# Patient Record
Sex: Female | Born: 1955 | Race: White | Hispanic: No | Marital: Married | Smoking: Current every day smoker
Health system: Southern US, Community
[De-identification: ages and names within clinical notes are randomized; demographics above are authoritative.]

## PROBLEM LIST (undated history)

## (undated) DIAGNOSIS — K219 Gastro-esophageal reflux disease without esophagitis: Secondary | ICD-10-CM

## (undated) DIAGNOSIS — J449 Chronic obstructive pulmonary disease, unspecified: Secondary | ICD-10-CM

## (undated) DIAGNOSIS — T4145XA Adverse effect of unspecified anesthetic, initial encounter: Secondary | ICD-10-CM

## (undated) DIAGNOSIS — Z87442 Personal history of urinary calculi: Secondary | ICD-10-CM

## (undated) DIAGNOSIS — M1990 Unspecified osteoarthritis, unspecified site: Secondary | ICD-10-CM

## (undated) DIAGNOSIS — I10 Essential (primary) hypertension: Secondary | ICD-10-CM

## (undated) DIAGNOSIS — C801 Malignant (primary) neoplasm, unspecified: Secondary | ICD-10-CM

## (undated) DIAGNOSIS — J45909 Unspecified asthma, uncomplicated: Secondary | ICD-10-CM

## (undated) DIAGNOSIS — D649 Anemia, unspecified: Secondary | ICD-10-CM

## (undated) DIAGNOSIS — Z9289 Personal history of other medical treatment: Secondary | ICD-10-CM

## (undated) DIAGNOSIS — G8929 Other chronic pain: Secondary | ICD-10-CM

## (undated) DIAGNOSIS — G629 Polyneuropathy, unspecified: Secondary | ICD-10-CM

## (undated) DIAGNOSIS — K859 Acute pancreatitis without necrosis or infection, unspecified: Secondary | ICD-10-CM

## (undated) DIAGNOSIS — E162 Hypoglycemia, unspecified: Secondary | ICD-10-CM

## (undated) DIAGNOSIS — R238 Other skin changes: Secondary | ICD-10-CM

## (undated) DIAGNOSIS — D62 Acute posthemorrhagic anemia: Secondary | ICD-10-CM

## (undated) DIAGNOSIS — R51 Headache: Secondary | ICD-10-CM

## (undated) DIAGNOSIS — F17210 Nicotine dependence, cigarettes, uncomplicated: Secondary | ICD-10-CM

## (undated) DIAGNOSIS — F419 Anxiety disorder, unspecified: Secondary | ICD-10-CM

## (undated) DIAGNOSIS — M5430 Sciatica, unspecified side: Secondary | ICD-10-CM

## (undated) DIAGNOSIS — D509 Iron deficiency anemia, unspecified: Secondary | ICD-10-CM

## (undated) DIAGNOSIS — IMO0001 Reserved for inherently not codable concepts without codable children: Secondary | ICD-10-CM

## (undated) DIAGNOSIS — G5600 Carpal tunnel syndrome, unspecified upper limb: Secondary | ICD-10-CM

## (undated) DIAGNOSIS — R011 Cardiac murmur, unspecified: Secondary | ICD-10-CM

## (undated) DIAGNOSIS — R519 Headache, unspecified: Secondary | ICD-10-CM

## (undated) DIAGNOSIS — Z9889 Other specified postprocedural states: Secondary | ICD-10-CM

## (undated) DIAGNOSIS — J189 Pneumonia, unspecified organism: Secondary | ICD-10-CM

## (undated) DIAGNOSIS — T7840XA Allergy, unspecified, initial encounter: Secondary | ICD-10-CM

## (undated) DIAGNOSIS — R569 Unspecified convulsions: Secondary | ICD-10-CM

## (undated) DIAGNOSIS — G709 Myoneural disorder, unspecified: Secondary | ICD-10-CM

## (undated) DIAGNOSIS — R112 Nausea with vomiting, unspecified: Secondary | ICD-10-CM

## (undated) DIAGNOSIS — T8859XA Other complications of anesthesia, initial encounter: Secondary | ICD-10-CM

## (undated) HISTORY — PX: FOOT SURGERY: SHX648

## (undated) HISTORY — PX: COLONOSCOPY: SHX174

## (undated) HISTORY — PX: LAMINECTOMY: SHX219

## (undated) HISTORY — PX: CERVICAL FUSION: SHX112

## (undated) HISTORY — DX: Myoneural disorder, unspecified: G70.9

## (undated) HISTORY — DX: Chronic obstructive pulmonary disease, unspecified: J44.9

## (undated) HISTORY — PX: BACK SURGERY: SHX140

## (undated) HISTORY — DX: Iron deficiency anemia, unspecified: D50.9

## (undated) HISTORY — PX: OTHER SURGICAL HISTORY: SHX169

## (undated) HISTORY — DX: Anemia, unspecified: D64.9

## (undated) HISTORY — PX: TONSILLECTOMY: SUR1361

## (undated) HISTORY — PX: SPINAL CORD STIMULATOR BATTERY EXCHANGE: SHX6202

## (undated) HISTORY — DX: Anxiety disorder, unspecified: F41.9

## (undated) HISTORY — DX: Unspecified osteoarthritis, unspecified site: M19.90

## (undated) HISTORY — DX: Nicotine dependence, cigarettes, uncomplicated: F17.210

## (undated) HISTORY — DX: Hypoglycemia, unspecified: E16.2

## (undated) HISTORY — DX: Unspecified asthma, uncomplicated: J45.909

## (undated) HISTORY — PX: IMPLANTATION / PLACEMENT EPIDURAL NEUROSTIMULATOR ELECTRODES: SUR687

## (undated) HISTORY — DX: Acute posthemorrhagic anemia: D62

## (undated) HISTORY — DX: Allergy, unspecified, initial encounter: T78.40XA

## (undated) HISTORY — DX: Other skin changes: R23.8

## (undated) HISTORY — PX: LUMBAR FUSION: SHX111

---

## 2003-03-07 DIAGNOSIS — R011 Cardiac murmur, unspecified: Secondary | ICD-10-CM

## 2003-03-07 HISTORY — PX: ABDOMINAL HYSTERECTOMY: SHX81

## 2003-03-07 HISTORY — DX: Cardiac murmur, unspecified: R01.1

## 2015-02-22 ENCOUNTER — Ambulatory Visit (INDEPENDENT_AMBULATORY_CARE_PROVIDER_SITE_OTHER): Admitting: Family Medicine

## 2015-02-22 ENCOUNTER — Encounter: Payer: Self-pay | Admitting: Family Medicine

## 2015-02-22 VITALS — BP 153/89 | HR 80 | Temp 97.6°F | Ht 63.0 in | Wt 101.2 lb

## 2015-02-22 DIAGNOSIS — J42 Unspecified chronic bronchitis: Secondary | ICD-10-CM

## 2015-02-22 DIAGNOSIS — M549 Dorsalgia, unspecified: Secondary | ICD-10-CM

## 2015-02-22 DIAGNOSIS — R519 Headache, unspecified: Secondary | ICD-10-CM | POA: Insufficient documentation

## 2015-02-22 DIAGNOSIS — G44219 Episodic tension-type headache, not intractable: Secondary | ICD-10-CM

## 2015-02-22 DIAGNOSIS — R51 Headache: Secondary | ICD-10-CM

## 2015-02-22 DIAGNOSIS — Z23 Encounter for immunization: Secondary | ICD-10-CM

## 2015-02-22 DIAGNOSIS — G8929 Other chronic pain: Secondary | ICD-10-CM

## 2015-02-22 DIAGNOSIS — J449 Chronic obstructive pulmonary disease, unspecified: Secondary | ICD-10-CM | POA: Insufficient documentation

## 2015-02-22 DIAGNOSIS — I10 Essential (primary) hypertension: Secondary | ICD-10-CM

## 2015-02-22 DIAGNOSIS — F1190 Opioid use, unspecified, uncomplicated: Secondary | ICD-10-CM | POA: Diagnosis not present

## 2015-02-22 MED ORDER — METHOCARBAMOL 500 MG PO TABS: 500.0000 mg | ORAL_TABLET | Freq: Three times a day (TID) | ORAL | Status: DC | PRN

## 2015-02-22 MED ORDER — AMLODIPINE BESYLATE 5 MG PO TABS: 5.0000 mg | ORAL_TABLET | Freq: Every day | ORAL | Status: DC

## 2015-02-22 MED ORDER — PREGABALIN 150 MG PO CAPS: 150.0000 mg | ORAL_CAPSULE | Freq: Three times a day (TID) | ORAL | Status: DC

## 2015-02-22 MED ORDER — BUDESONIDE-FORMOTEROL FUMARATE 160-4.5 MCG/ACT IN AERO
2.0000 | INHALATION_SPRAY | Freq: Two times a day (BID) | RESPIRATORY_TRACT | Status: DC
Start: 2015-02-22 — End: 2015-09-02

## 2015-02-22 MED ORDER — TRAZODONE HCL 100 MG PO TABS: 50.0000 mg | ORAL_TABLET | Freq: Every day | ORAL | Status: DC

## 2015-02-22 MED ORDER — ALBUTEROL SULFATE HFA 108 (90 BASE) MCG/ACT IN AERS: 2.0000 | INHALATION_SPRAY | Freq: Four times a day (QID) | RESPIRATORY_TRACT | Status: DC | PRN

## 2015-02-22 MED ORDER — DULOXETINE HCL 30 MG PO CPEP: 90.0000 mg | ORAL_CAPSULE | Freq: Every day | ORAL | Status: DC

## 2015-02-22 MED ORDER — PREGABALIN 200 MG PO CAPS: 200.0000 mg | ORAL_CAPSULE | Freq: Three times a day (TID) | ORAL | Status: DC

## 2015-02-22 NOTE — Progress Notes (Addendum)
HPI  Patient presents today to establish care.  Patient has recently moved from [facility].  She has chronic back pain Recently taking 80 mg of hydrocodone daily (20 mg QID) , 350 mg of Soma 3 times daily, Lyrica 200 mg 3 times daily ,  And Cymbalta 30 mg 3 times daiy  she had a pain doctor in [place] She has been out of hydrocodone and Soma for 8 days, she denies any withdrawal effects or diarrhea during this time   headache  bandlike distribution sharp pain , onset for several years now, worse over the last few months. Previously helped by trazodone wich helped hersleep , relieving her headaches.  requests trazodone today.   elevated blood pressure No history of hypertension, howeve states that hr systolic blood pressure is frequently  160-170 at home over 03J diastolic No chest pain, palpitations, or leg edema.  COPD Still smoking Doing well wih Symbicort and abuterol She does have baseline cough and shortness o brath   PMH: Smoking status noted Previous medical, surgical, social, family history reviewed and updated in EMR ROS: Per HPI  Objective: BP 153/89 mmHg  Pulse 80  Temp(Src) 97.6 F (36.4 C) (Oral)  Ht '5\' 3"'  (1.6 m)  Wt 101 lb 3.2 oz (45.904 kg)  BMI 17.93 kg/m2 Gen: NAD, alert, cooperative with exam HEENT: NCAT, TMs normal bilaterally, nares clear but with Perforated septum, oropharynx clear CV: RRR, good S1/S2, no murmur Resp: CTABL, no wheezes, non-labored Abd: SNTND, BS present, no guarding or organomegaly Ext: No edema, warm Neuro: Alert and oriented, normal gait  Assessment and plan:  #  Chronic pain Refill Cymbalta and Lyrica, however I decreased Lyrca to 150 mg 3 time daily Declines Norco and Soma  Trial of Robaxin instead of Soma Refer topain management [place] controlled substance database reviewed which has no fills  # HTN New Dx Start amlodipine Labs, fasting  # COPD Smoking cessation  Continue proair and  symbicort  # Sleep, headache Trazodone per her request.  Consistent with tension type HA, Described as stable over several years with tension type distribution     Orders Placed This Encounter  Procedures  . CBC with Differential  . CMP14+EGFR  . Lipid panel  . Ambulatory referral to Pain Clinic    Referral Priority:  Routine    Referral Type:  Consultation    Referral Reason:  Specialty Services Required    Requested Specialty:  Pain Medicine    Number of Visits Requested:  1    Meds ordered this encounter  Medications  . DISCONTD: budesonide-formoterol (SYMBICORT) 160-4.5 MCG/ACT inhaler    Sig: Inhale 2 puffs into the lungs 2 (two) times daily.  Marland Kitchen DISCONTD: ALBUTEROL SULFATE HFA IN    Sig: Inhale into the lungs.  Marland Kitchen DISCONTD: DULoxetine HCl (CYMBALTA PO)    Sig: Take 90 mg by mouth daily.  . carisoprodol (SOMA) 350 MG tablet    Sig: Take 350 mg by mouth 3 (three) times daily. Reported on 02/22/2015  . DISCONTD: pregabalin (LYRICA) 200 MG capsule    Sig: Take 200 mg by mouth 3 (three) times daily.  . Hydrocodone-Acetaminophen 10-300 MG TABS    Sig: Take 1 tablet by mouth every 6 (six) hours as needed.  . Aspirin-Salicylamide-Caffeine (BC HEADACHE POWDER PO)    Sig: Take by mouth.  . ranitidine (ACID REDUCER) 150 MG tablet    Sig: Take 150 mg by mouth daily.  . cetirizine (ZYRTEC) 10 MG tablet  Sig: Take 10 mg by mouth daily.  . DULoxetine (CYMBALTA) 30 MG capsule    Sig: Take 3 capsules (90 mg total) by mouth daily.    Dispense:  90 capsule    Refill:  0  . budesonide-formoterol (SYMBICORT) 160-4.5 MCG/ACT inhaler    Sig: Inhale 2 puffs into the lungs 2 (two) times daily.    Dispense:  1 Inhaler    Refill:  11  . albuterol (PROVENTIL HFA;VENTOLIN HFA) 108 (90 BASE) MCG/ACT inhaler    Sig: Inhale 2 puffs into the lungs every 6 (six) hours as needed for wheezing or shortness of breath.    Dispense:  1 Inhaler    Refill:  0  . DISCONTD: pregabalin (LYRICA) 200 MG  capsule    Sig: Take 1 capsule (200 mg total) by mouth 3 (three) times daily.    Dispense:  90 capsule    Refill:  3  . amLODipine (NORVASC) 5 MG tablet    Sig: Take 1 tablet (5 mg total) by mouth daily.    Dispense:  90 tablet    Refill:  3  . pregabalin (LYRICA) 150 MG capsule    Sig: Take 1 capsule (150 mg total) by mouth 3 (three) times daily.    Dispense:  90 capsule    Refill:  3  . traZODone (DESYREL) 100 MG tablet    Sig: Take 0.5-1 tablets (50-100 mg total) by mouth at bedtime.    Dispense:  30 tablet    Refill:  [place], MD [place] 02/22/2015, 2:02 PM

## 2015-02-22 NOTE — Patient Instructions (Addendum)
Great to meet you!  Please come back in 1 month  I have started 2 new medications  Trazodone for sleep Amlodipine for high blood pressure  We will call with labs within 1 week  We will arrange a pain clinic referral

## 2015-02-22 NOTE — Addendum Note (Signed)
Addended by: Timmothy Euler on: 02/22/2015 05:26 PM   Modules accepted: Miquel Dunn

## 2015-02-23 LAB — CMP14+EGFR
ALK PHOS: 78 IU/L (ref 39–117)
ALT: 9 IU/L (ref 0–32)
AST: 18 IU/L (ref 0–40)
Albumin/Globulin Ratio: 1.9 (ref 1.1–2.5)
Albumin: 4.3 g/dL (ref 3.6–4.8)
BUN/Creatinine Ratio: 21 (ref 11–26)
BUN: 11 mg/dL (ref 8–27)
Bilirubin Total: <0.2 mg/dL (ref 0.0–1.2)
CO2: 21 mmol/L (ref 18–29)
CREATININE: 0.53 mg/dL — AB (ref 0.57–1.00)
Calcium: 8.7 mg/dL (ref 8.7–10.3)
Chloride: 102 mmol/L (ref 96–106)
GFR calc Af Amer: 119 mL/min/{1.73_m2} (ref >59)
GFR calc non Af Amer: 103 mL/min/{1.73_m2} (ref >59)
GLOBULIN, TOTAL: 2.3 g/dL (ref 1.5–4.5)
Glucose: 86 mg/dL (ref 65–99)
POTASSIUM: 4.3 mmol/L (ref 3.5–5.2)
SODIUM: 141 mmol/L (ref 134–144)
Total Protein: 6.6 g/dL (ref 6.0–8.5)

## 2015-02-23 LAB — CBC WITH DIFFERENTIAL/PLATELET
BASOS ABS: 0.1 10*3/uL (ref 0.0–0.2)
Basos: 1 %
EOS (ABSOLUTE): 0.1 10*3/uL (ref 0.0–0.4)
Eos: 2 %
HEMATOCRIT: 33.2 % — AB (ref 34.0–46.6)
Hemoglobin: 10.5 g/dL — ABNORMAL LOW (ref 11.1–15.9)
Immature Grans (Abs): 0 10*3/uL (ref 0.0–0.1)
Immature Granulocytes: 0 %
LYMPHS ABS: 2 10*3/uL (ref 0.7–3.1)
Lymphs: 33 %
MCH: 29.1 pg (ref 26.6–33.0)
MCHC: 31.6 g/dL (ref 31.5–35.7)
MCV: 92 fL (ref 79–97)
MONOS ABS: 0.4 10*3/uL (ref 0.1–0.9)
Monocytes: 7 %
Neutrophils Absolute: 3.5 10*3/uL (ref 1.4–7.0)
Neutrophils: 57 %
Platelets: 256 10*3/uL (ref 150–379)
RBC: 3.61 x10E6/uL — AB (ref 3.77–5.28)
RDW: 16.7 % — AB (ref 12.3–15.4)
WBC: 6.1 10*3/uL (ref 3.4–10.8)

## 2015-02-23 LAB — LIPID PANEL
CHOLESTEROL TOTAL: 258 mg/dL — AB (ref 100–199)
Chol/HDL Ratio: 2.8 ratio units (ref 0.0–4.4)
HDL: 92 mg/dL (ref >39)
LDL CALC: 151 mg/dL — AB (ref 0–99)
TRIGLYCERIDES: 74 mg/dL (ref 0–149)
VLDL Cholesterol Cal: 15 mg/dL (ref 5–40)

## 2015-02-25 ENCOUNTER — Telehealth: Payer: Self-pay

## 2015-02-25 NOTE — Telephone Encounter (Signed)
Insurance prior authorized Symbicort 160-4.5 through 03/05/98  Case # [id number]

## 2015-03-12 ENCOUNTER — Ambulatory Visit (INDEPENDENT_AMBULATORY_CARE_PROVIDER_SITE_OTHER): Admitting: Family Medicine

## 2015-03-12 ENCOUNTER — Encounter: Payer: Self-pay | Admitting: Family Medicine

## 2015-03-12 VITALS — BP 98/59 | HR 88 | Temp 100.7°F | Ht 63.0 in | Wt 100.0 lb

## 2015-03-12 DIAGNOSIS — J029 Acute pharyngitis, unspecified: Secondary | ICD-10-CM

## 2015-03-12 DIAGNOSIS — R6889 Other general symptoms and signs: Secondary | ICD-10-CM | POA: Diagnosis not present

## 2015-03-12 DIAGNOSIS — R509 Fever, unspecified: Secondary | ICD-10-CM

## 2015-03-12 LAB — POCT INFLUENZA A/B
INFLUENZA A, POC: NEGATIVE
INFLUENZA B, POC: NEGATIVE

## 2015-03-12 LAB — POCT RAPID STREP A (OFFICE): RAPID STREP A SCREEN: NEGATIVE

## 2015-03-12 MED ORDER — OSELTAMIVIR PHOSPHATE 75 MG PO CAPS: 75.0000 mg | ORAL_CAPSULE | Freq: Two times a day (BID) | ORAL | Status: DC

## 2015-03-12 NOTE — Progress Notes (Signed)
Subjective:  Patient ID: Jasmine [name], female    DOB: 02/06/1956  Age: 60 y.o. MRN: [id number]  CC: Sore Throat and Fever   HPI Jasmine [name]. [name] presents for  Fever to 101.9 this AM. Onset 2 days ago. Felt fluid in ears and sinuses.Had flu shot.  History Jasmine [name] has a past medical history of Allergy; Anemia; Anxiety; Arthritis; Asthma; COPD (chronic obstructive pulmonary disease) ([place]); Hypoglycemia; and Neuromuscular disorder ([place]).   She has past surgical history that includes Cesarean section (1982); Abdominal hysterectomy (2005); disk repair; and Implantation / placement epidural neurostimulator electrodes.   Her family history includes Asthma in her son; COPD in her father and mother; Cancer in her father; Dementia in her father; Diabetes in her father and mother; Heart disease in her father and mother; Hyperlipidemia in her mother; Hypertension in her mother.She reports that she has been smoking Cigarettes.  She has been smoking about 1.00 pack per day. She does not have any smokeless tobacco history on file. She reports that she does not drink alcohol or use illicit drugs.  Outpatient Prescriptions Prior to Visit  Medication Sig Dispense Refill  . albuterol (PROVENTIL HFA;VENTOLIN HFA) 108 (90 BASE) MCG/ACT inhaler Inhale 2 puffs into the lungs every 6 (six) hours as needed for wheezing or shortness of breath. 1 Inhaler 0  . amLODipine (NORVASC) 5 MG tablet Take 1 tablet (5 mg total) by mouth daily. 90 tablet 3  . Aspirin-Salicylamide-Caffeine (BC HEADACHE POWDER PO) Take by mouth.    . budesonide-formoterol (SYMBICORT) 160-4.5 MCG/ACT inhaler Inhale 2 puffs into the lungs 2 (two) times daily. 1 Inhaler 11  . cetirizine (ZYRTEC) 10 MG tablet Take 10 mg by mouth daily. Reported on 03/12/2015    . DULoxetine (CYMBALTA) 30 MG capsule Take 3 capsules (90 mg total) by mouth daily. 90 capsule 0  . methocarbamol (ROBAXIN) 500 MG tablet Take 1 tablet (500 mg total) by mouth every 8 (eight)  hours as needed for muscle spasms. 90 tablet 3  . pregabalin (LYRICA) 150 MG capsule Take 1 capsule (150 mg total) by mouth 3 (three) times daily. 90 capsule 3  . ranitidine (ACID REDUCER) 150 MG tablet Take 150 mg by mouth daily.    . traZODone (DESYREL) 100 MG tablet Take 0.5-1 tablets (50-100 mg total) by mouth at bedtime. 30 tablet 3  . carisoprodol (SOMA) 350 MG tablet Take 350 mg by mouth 3 (three) times daily. Reported on 03/12/2015    . Hydrocodone-Acetaminophen 10-300 MG TABS Take 1 tablet by mouth every 6 (six) hours as needed. Reported on 03/12/2015     No facility-administered medications prior to visit.    ROS Review of Systems  Constitutional: Negative for fever, chills, activity change and appetite change.  HENT: Positive for congestion, ear pain and postnasal drip. Negative for ear discharge, hearing loss, nosebleeds, rhinorrhea, sinus pressure, sneezing and trouble swallowing.   Respiratory: Positive for cough. Negative for chest tightness and shortness of breath.   Cardiovascular: Negative for chest pain and palpitations.  Skin: Negative for rash.    Objective:  BP 98/59 mmHg  Pulse 88  Temp(Src) 100.7 F (38.2 C) (Oral)  Ht '5\' 3"'$  (1.6 m)  Wt 100 lb (45.36 kg)  BMI 17.72 kg/m2  BP Readings from Last 3 Encounters:  03/12/15 98/59  02/22/15 153/89    Wt Readings from Last 3 Encounters:  03/12/15 100 lb (45.36 kg)  02/22/15 101 lb 3.2 oz (45.904 kg)     Physical Exam  Constitutional: She appears well-developed and well-nourished.  HENT:  Head: Normocephalic and atraumatic.  Right Ear: Tympanic membrane and external ear normal. No decreased hearing is noted.  Left Ear: Tympanic membrane and external ear normal. No decreased hearing is noted.  Nose: Mucosal edema present. Right sinus exhibits no frontal sinus tenderness. Left sinus exhibits no frontal sinus tenderness.  Mouth/Throat: No oropharyngeal exudate or posterior oropharyngeal erythema.  Neck: No  Brudzinski's sign noted.  Pulmonary/Chest: Breath sounds normal. No respiratory distress.  Lymphadenopathy:       Head (right side): No preauricular adenopathy present.       Head (left side): No preauricular adenopathy present.       Right cervical: No superficial cervical adenopathy present.      Left cervical: No superficial cervical adenopathy present.     Lab Results  Component Value Date   WBC 17.7* 03/12/2015   HCT 31.3* 03/12/2015   PLT 343 03/12/2015   GLUCOSE 86 02/22/2015   CHOL 258* 02/22/2015   TRIG 74 02/22/2015   HDL 92 02/22/2015   LDLCALC 151* 02/22/2015   ALT 9 02/22/2015   AST 18 02/22/2015   NA 141 02/22/2015   K 4.3 02/22/2015   CL 102 02/22/2015   CREATININE 0.53* 02/22/2015   BUN 11 02/22/2015   CO2 21 02/22/2015    Results for orders placed or performed in visit on 03/12/15  CBC with Differential/Platelet  Result Value Ref Range   WBC 17.7 (H) 3.4 - 10.8 x10E3/uL   RBC 3.48 (L) 3.77 - 5.28 x10E6/uL   Hemoglobin 10.0 (L) 11.1 - 15.9 g/dL   Hematocrit 31.3 (L) 34.0 - 46.6 %   MCV 90 79 - 97 fL   MCH 28.7 26.6 - 33.0 pg   MCHC 31.9 31.5 - 35.7 g/dL   RDW 15.7 (H) 12.3 - 15.4 %   Platelets 343 150 - 379 x10E3/uL   Neutrophils 78 %   Lymphs 9 %   Monocytes 13 %   Eos 0 %   Basos 0 %   Neutrophils Absolute 13.9 (H) 1.4 - 7.0 x10E3/uL   Lymphocytes Absolute 1.5 0.7 - 3.1 x10E3/uL   Monocytes Absolute 2.2 (H) 0.1 - 0.9 x10E3/uL   EOS (ABSOLUTE) 0.0 0.0 - 0.4 x10E3/uL   Basophils Absolute 0.0 0.0 - 0.2 x10E3/uL   Immature Granulocytes 0 %   Immature Grans (Abs) 0.0 0.0 - 0.1 x10E3/uL  POCT Influenza A/B  Result Value Ref Range   Influenza A, POC Negative Negative   Influenza B, POC Negative Negative  POCT rapid strep A  Result Value Ref Range   Rapid Strep A Screen Negative Negative     Assessment & Plan:   Jasmine [name] was seen today for sore throat and fever.  Diagnoses and all orders for this visit:  Sore throat -     POCT Influenza  A/B -     POCT rapid strep A -     CBC with Differential/Platelet  Fever, unspecified -     POCT Influenza A/B -     POCT rapid strep A -     CBC with Differential/Platelet  Flu-like symptoms -     CBC with Differential/Platelet  Other orders -     oseltamivir (TAMIFLU) 75 MG capsule; Take 1 capsule (75 mg total) by mouth 2 (two) times daily. -     amoxicillin-clavulanate (AUGMENTIN) 875-125 MG tablet; Take 1 tablet by mouth 2 (two) times daily. Take all of this medication  I am having Jasmine [name] start on oseltamivir and amoxicillin-clavulanate. I am also having her maintain her carisoprodol, Hydrocodone-Acetaminophen, Aspirin-Salicylamide-Caffeine (BC HEADACHE POWDER PO), ranitidine, cetirizine, DULoxetine, budesonide-formoterol, albuterol, amLODipine, pregabalin, traZODone, and methocarbamol.  Meds ordered this encounter  Medications  . oseltamivir (TAMIFLU) 75 MG capsule    Sig: Take 1 capsule (75 mg total) by mouth 2 (two) times daily.    Dispense:  10 capsule    Refill:  0  . amoxicillin-clavulanate (AUGMENTIN) 875-125 MG tablet    Sig: Take 1 tablet by mouth 2 (two) times daily. Take all of this medication    Dispense:  20 tablet    Refill:  0    Follow-up: Return if symptoms worsen or fail to improve.  Claretta Fraise, M.D.

## 2015-03-13 LAB — CBC WITH DIFFERENTIAL/PLATELET
Basophils Absolute: 0 10*3/uL (ref 0.0–0.2)
Basos: 0 %
EOS (ABSOLUTE): 0 10*3/uL (ref 0.0–0.4)
EOS: 0 %
HEMATOCRIT: 31.3 % — AB (ref 34.0–46.6)
Hemoglobin: 10 g/dL — ABNORMAL LOW (ref 11.1–15.9)
Immature Grans (Abs): 0 10*3/uL (ref 0.0–0.1)
Immature Granulocytes: 0 %
LYMPHS ABS: 1.5 10*3/uL (ref 0.7–3.1)
Lymphs: 9 %
MCH: 28.7 pg (ref 26.6–33.0)
MCHC: 31.9 g/dL (ref 31.5–35.7)
MCV: 90 fL (ref 79–97)
MONOS ABS: 2.2 10*3/uL — AB (ref 0.1–0.9)
Monocytes: 13 %
Neutrophils Absolute: 13.9 10*3/uL — ABNORMAL HIGH (ref 1.4–7.0)
Neutrophils: 78 %
Platelets: 343 10*3/uL (ref 150–379)
RBC: 3.48 x10E6/uL — AB (ref 3.77–5.28)
RDW: 15.7 % — AB (ref 12.3–15.4)
WBC: 17.7 10*3/uL — AB (ref 3.4–10.8)

## 2015-03-13 MED ORDER — AMOXICILLIN-POT CLAVULANATE 875-125 MG PO TABS: 1.0000 | ORAL_TABLET | Freq: Two times a day (BID) | ORAL | Status: DC

## 2015-03-19 ENCOUNTER — Other Ambulatory Visit: Payer: Self-pay | Admitting: Family Medicine

## 2015-03-19 ENCOUNTER — Telehealth: Payer: Self-pay | Admitting: Family Medicine

## 2015-03-19 MED ORDER — ONDANSETRON 8 MG PO TBDP: 8.0000 mg | ORAL_TABLET | Freq: Four times a day (QID) | ORAL | Status: DC | PRN

## 2015-03-19 MED ORDER — SULFAMETHOXAZOLE-TRIMETHOPRIM 800-160 MG PO TABS: 1.0000 | ORAL_TABLET | Freq: Two times a day (BID) | ORAL | Status: DC

## 2015-03-19 NOTE — Telephone Encounter (Signed)
New med sent. Also ondansetron to settle her stomach.

## 2015-03-19 NOTE — Telephone Encounter (Signed)
Pt aware.

## 2015-03-19 NOTE — Telephone Encounter (Signed)
Patient is having sever diarrhea and vomiting with the augmentin. Patient would like a different antibiotic called into the pharmacy.

## 2015-03-26 ENCOUNTER — Encounter: Payer: Self-pay | Admitting: Family Medicine

## 2015-03-26 ENCOUNTER — Ambulatory Visit (INDEPENDENT_AMBULATORY_CARE_PROVIDER_SITE_OTHER): Admitting: Family Medicine

## 2015-03-26 VITALS — BP 131/73 | HR 84 | Temp 98.3°F | Ht 63.0 in | Wt 100.8 lb

## 2015-03-26 DIAGNOSIS — J441 Chronic obstructive pulmonary disease with (acute) exacerbation: Secondary | ICD-10-CM

## 2015-03-26 DIAGNOSIS — I10 Essential (primary) hypertension: Secondary | ICD-10-CM

## 2015-03-26 MED ORDER — AZITHROMYCIN 250 MG PO TABS: ORAL_TABLET | ORAL | Status: DC

## 2015-03-26 MED ORDER — TRIAMCINOLONE ACETONIDE 40 MG/ML IJ SUSP
40.0000 mg | Freq: Once | INTRAMUSCULAR | Status: AC
  Administered 2015-03-26: 40 mg via INTRAMUSCULAR

## 2015-03-26 NOTE — Progress Notes (Signed)
   HPI  Patient presents today for evaluation of cough, shortness of breath, and hypertension follow-up.  Cough Patient was seen last 4 influenza and was sent in an Augmentin course after she was found to have leukocytosis. Her ear pain and sinus pressure and pain has resolved, however her cough lingers on. She states she can only tolerate 5 days of Augmentin due to diarrhea. Has central chest tightness with deep inspiration, it is not exertional, is not radiating.  Hypertension Taking medication No worsening headache, chest pain, or palpitations. No leg edema Dyspnea as above  PMH: Smoking status noted ROS: Per HPI  Objective: BP 131/73 mmHg  Pulse 84  Temp(Src) 98.3 F (36.8 C) (Oral)  Ht '5\' 3"'$  (1.6 m)  Wt 100 lb 12.8 oz (45.723 kg)  BMI 17.86 kg/m2 Gen: NAD, alert, cooperative with exam HEENT: NCAT CV: RRR, good S1/S2, no murmur Resp: Nonlabored, decreased air movement in the bases Ext: No edema, warm Neuro: Alert and oriented, No gross deficits  Assessment and plan:  # COPD exacerbation Considering history of COPD and increased cough I will go ahead and give her IM Kenalog and azithromycin She did not tolerate Augmentin, this only caused diarrhea. She does have erythromycin on her list of allergies, however she's unsure of what happened with that.- I suspect diarrhea Return to clinic if worsening or does not improve as expected  # Hypertension Doing well on amlodipine Continue Back in 4 months    Meds ordered this encounter  Medications  . azithromycin (ZITHROMAX) 250 MG tablet    Sig: Take 2 tablets on day 1 and 1 tablet daily after that    Dispense:  6 tablet    Refill:  [place], MD [facility] 03/26/2015, 2:25 PM

## 2015-03-26 NOTE — Addendum Note (Signed)
Addended by: Nigel Berthold C on: 03/26/2015 03:10 PM   Modules accepted: Orders

## 2015-03-26 NOTE — Patient Instructions (Signed)
Great to see you!  We are treating you for a COPD exacerbation with steroids and a complete course of antibiotics  Lets plan to see you back in 4 months unless you need [place] sooner  Try to eat yogurt once daily or start a probiotic while you are taking the medication

## 2015-04-28 ENCOUNTER — Encounter: Payer: Self-pay | Admitting: Family Medicine

## 2015-05-06 ENCOUNTER — Telehealth: Payer: Self-pay | Admitting: Family Medicine

## 2015-07-01 ENCOUNTER — Other Ambulatory Visit: Payer: Self-pay | Admitting: Nurse Practitioner

## 2015-07-01 DIAGNOSIS — M5416 Radiculopathy, lumbar region: Secondary | ICD-10-CM

## 2015-07-01 DIAGNOSIS — M542 Cervicalgia: Secondary | ICD-10-CM

## 2015-07-01 DIAGNOSIS — M5412 Radiculopathy, cervical region: Secondary | ICD-10-CM

## 2015-07-02 ENCOUNTER — Other Ambulatory Visit (HOSPITAL_COMMUNITY): Payer: Self-pay | Admitting: Nurse Practitioner

## 2015-07-02 DIAGNOSIS — M5412 Radiculopathy, cervical region: Secondary | ICD-10-CM

## 2015-07-02 DIAGNOSIS — M542 Cervicalgia: Secondary | ICD-10-CM

## 2015-07-02 DIAGNOSIS — M5416 Radiculopathy, lumbar region: Secondary | ICD-10-CM

## 2015-07-07 ENCOUNTER — Ambulatory Visit (HOSPITAL_COMMUNITY)
Admission: RE | Admit: 2015-07-07 | Discharge: 2015-07-07 | Disposition: A | Discharge status: Home / Self Care | Source: Ambulatory Visit | Attending: Nurse Practitioner | Admitting: Nurse Practitioner

## 2015-07-07 ENCOUNTER — Other Ambulatory Visit (HOSPITAL_COMMUNITY): Payer: Self-pay

## 2015-07-07 DIAGNOSIS — Z981 Arthrodesis status: Secondary | ICD-10-CM | POA: Insufficient documentation

## 2015-07-07 DIAGNOSIS — M5416 Radiculopathy, lumbar region: Secondary | ICD-10-CM | POA: Diagnosis present

## 2015-07-07 DIAGNOSIS — M5412 Radiculopathy, cervical region: Secondary | ICD-10-CM | POA: Diagnosis present

## 2015-07-07 DIAGNOSIS — Z9889 Other specified postprocedural states: Secondary | ICD-10-CM | POA: Insufficient documentation

## 2015-07-07 DIAGNOSIS — M542 Cervicalgia: Secondary | ICD-10-CM

## 2015-07-07 DIAGNOSIS — M5126 Other intervertebral disc displacement, lumbar region: Secondary | ICD-10-CM | POA: Insufficient documentation

## 2015-07-07 DIAGNOSIS — M4806 Spinal stenosis, lumbar region: Secondary | ICD-10-CM | POA: Diagnosis not present

## 2015-07-26 ENCOUNTER — Ambulatory Visit: Admitting: Family Medicine

## 2015-07-29 ENCOUNTER — Encounter: Payer: Self-pay | Admitting: Family Medicine

## 2015-07-29 ENCOUNTER — Ambulatory Visit (INDEPENDENT_AMBULATORY_CARE_PROVIDER_SITE_OTHER): Admitting: Family Medicine

## 2015-07-29 VITALS — BP 140/73 | HR 78 | Temp 97.9°F | Ht 63.0 in | Wt 103.8 lb

## 2015-07-29 DIAGNOSIS — D649 Anemia, unspecified: Secondary | ICD-10-CM

## 2015-07-29 DIAGNOSIS — M549 Dorsalgia, unspecified: Secondary | ICD-10-CM

## 2015-07-29 DIAGNOSIS — E559 Vitamin D deficiency, unspecified: Secondary | ICD-10-CM | POA: Insufficient documentation

## 2015-07-29 DIAGNOSIS — I10 Essential (primary) hypertension: Secondary | ICD-10-CM | POA: Diagnosis not present

## 2015-07-29 DIAGNOSIS — G8929 Other chronic pain: Secondary | ICD-10-CM

## 2015-07-29 HISTORY — DX: Anemia, unspecified: D64.9

## 2015-07-29 MED ORDER — PANTOPRAZOLE SODIUM 40 MG PO TBEC: 40.0000 mg | DELAYED_RELEASE_TABLET | Freq: Every day | ORAL | Status: DC

## 2015-07-29 MED ORDER — ALBUTEROL SULFATE (2.5 MG/3ML) 0.083% IN NEBU: 2.5000 mg | INHALATION_SOLUTION | Freq: Four times a day (QID) | RESPIRATORY_TRACT | Status: DC | PRN

## 2015-07-29 MED ORDER — DULOXETINE HCL 30 MG PO CPEP: 90.0000 mg | ORAL_CAPSULE | Freq: Every day | ORAL | Status: DC

## 2015-07-29 MED ORDER — AMLODIPINE BESYLATE 10 MG PO TABS: 5.0000 mg | ORAL_TABLET | Freq: Every day | ORAL | Status: DC

## 2015-07-29 MED ORDER — MOMETASONE FUROATE 50 MCG/ACT NA SUSP: 2.0000 | Freq: Every day | NASAL | Status: DC

## 2015-07-29 MED ORDER — ALBUTEROL SULFATE HFA 108 (90 BASE) MCG/ACT IN AERS: 2.0000 | INHALATION_SPRAY | Freq: Four times a day (QID) | RESPIRATORY_TRACT | Status: DC | PRN

## 2015-07-29 MED ORDER — HALOBETASOL PROPIONATE 0.05 % EX CREA: TOPICAL_CREAM | Freq: Two times a day (BID) | CUTANEOUS | Status: DC

## 2015-07-29 NOTE — Addendum Note (Signed)
Addended by: Timmothy Euler on: 07/29/2015 02:48 PM   Modules accepted: Orders

## 2015-07-29 NOTE — Progress Notes (Addendum)
   HPI  Patient presents today here for follow-up with several requests.  Hypertension Has been 140-150 and most checks recently Feels that amlodipine is increased. Chest pain, breathing pretty well, however COPD has been acting up recently with allergies.  She's considering quitting smoking, not quite ready.  GERD Has daily symptoms of epigastric pain helped by ranitidine but only helps if she takes too ranitidine at a time twice a day  Seasonal allergies Would like refill of Nasonex  Cymbalta Helping with pain, needs refill. Chronic pain is helped by chronic pain management, she is on high-dose narcotics, she feels well overall and is very happy with the treatment she is getting.  Anemia Gets very nauseous when it comes to iron replacement   PMH: Smoking status noted ROS: Per HPI  Objective: BP 140/73 mmHg  Pulse 78  Temp(Src) 97.9 F (36.6 C) (Oral)  Ht '5\' 3"'$  (1.6 m)  Wt 103 lb 12.8 oz (47.083 kg)  BMI 18.39 kg/m2 Gen: NAD, alert, cooperative with exam HEENT: NCAT CV: Regular rate and rhythm, quiet 1 to 2/6 systolic murmur loudest in the aortic area Resp: CTABL, no wheezes, non-labored Ext: No edema, warm Neuro: Alert and oriented, No gross deficits  Assessment and plan:  # Hypertension Close to goal, increase amlodipine to 10 mg daily  # Chronic back pain Managed well by pain management, appreciate your help Refilled Cymbalta  # Normocytic anemia Anemia profile ordered, I explained that I do not believe this is necessarily iron deficiency, will recommend iron replacement based on labs  # Vitamin D deficiency Rechecking It has been replaced by 50,000 units 8-12 weeks and now she is taking daily supplements  COPD Refilled albuterol inhaler and nebulizer Encouraged quitting smoking  Healthcare maintenance FOBT, she will consider colonoscopy She will schedule mammogram Pap smear-she is status post complete hysterectomy  Rash Patient also requests  halobetasol for rash that she gets intermittently throughout the summer She describes as an EGD red rash that resolved after 2-3 days of using halobetasol Refilled 1, follow-up as needed  Meds ordered this encounter  Medications  . HYSINGLA ER 60 MG T24A    Sig:   . HYDROcodone-acetaminophen (NORCO) 10-325 MG tablet    Sig:   . tiZANidine (ZANAFLEX) 4 MG tablet    Sig:   . amLODipine (NORVASC) 10 MG tablet    Sig: Take 0.5 tablets (5 mg total) by mouth daily.    Dispense:  90 tablet    Refill:  3  . DULoxetine (CYMBALTA) 30 MG capsule    Sig: Take 3 capsules (90 mg total) by mouth daily.    Dispense:  270 capsule    Refill:  3  . albuterol (PROVENTIL) (2.5 MG/3ML) 0.083% nebulizer solution    Sig: Take 3 mLs (2.5 mg total) by nebulization every 6 (six) hours as needed for wheezing or shortness of breath.    Dispense:  150 mL    Refill:  2  . albuterol (PROVENTIL HFA;VENTOLIN HFA) 108 (90 Base) MCG/ACT inhaler    Sig: Inhale 2 puffs into the lungs every 6 (six) hours as needed for wheezing or shortness of breath.    Dispense:  1 Inhaler    Refill:  [place], MD [facility] 07/29/2015, 1:20 PM

## 2015-07-29 NOTE — Patient Instructions (Signed)
Great to see you!  I have increased amlodipine to 10 mg daiily  We have stopped rantitidine and started protonix, 1 pill once daily  I have sent nasonex, this may not be formulary preferred for you from your insurance, over the counter nasocort is very similar  I have refill cymbalta  We will call with labs within 1 week

## 2015-07-30 LAB — CMP14+EGFR
A/G RATIO: 2.4 — AB (ref 1.2–2.2)
ALK PHOS: 54 IU/L (ref 39–117)
ALT: 17 IU/L (ref 0–32)
AST: 16 IU/L (ref 0–40)
Albumin: 4.7 g/dL (ref 3.6–4.8)
BUN / CREAT RATIO: 13 (ref 12–28)
BUN: 10 mg/dL (ref 8–27)
CHLORIDE: 101 mmol/L (ref 96–106)
CO2: 18 mmol/L (ref 18–29)
CREATININE: 0.77 mg/dL (ref 0.57–1.00)
Calcium: 9 mg/dL (ref 8.7–10.3)
GFR calc Af Amer: 96 mL/min/{1.73_m2} (ref >59)
GFR calc non Af Amer: 84 mL/min/{1.73_m2} (ref >59)
GLUCOSE: 81 mg/dL (ref 65–99)
Globulin, Total: 2 g/dL (ref 1.5–4.5)
Potassium: 3.8 mmol/L (ref 3.5–5.2)
Sodium: 141 mmol/L (ref 134–144)
TOTAL PROTEIN: 6.7 g/dL (ref 6.0–8.5)

## 2015-07-30 LAB — ANEMIA PROFILE B
BASOS: 1 %
Basophils Absolute: 0.1 10*3/uL (ref 0.0–0.2)
EOS (ABSOLUTE): 0.2 10*3/uL (ref 0.0–0.4)
Eos: 4 %
FERRITIN: 14 ng/mL — AB (ref 15–150)
FOLATE: 13.9 ng/mL (ref >3.0)
HEMOGLOBIN: 11.9 g/dL (ref 11.1–15.9)
Hematocrit: 37.7 % (ref 34.0–46.6)
IMMATURE GRANS (ABS): 0 10*3/uL (ref 0.0–0.1)
Immature Granulocytes: 0 %
Iron Saturation: 30 % (ref 15–55)
Iron: 116 ug/dL (ref 27–139)
LYMPHS ABS: 1.9 10*3/uL (ref 0.7–3.1)
LYMPHS: 36 %
MCH: 28.6 pg (ref 26.6–33.0)
MCHC: 31.6 g/dL (ref 31.5–35.7)
MCV: 91 fL (ref 79–97)
MONOS ABS: 0.5 10*3/uL (ref 0.1–0.9)
Monocytes: 9 %
NEUTROS ABS: 2.7 10*3/uL (ref 1.4–7.0)
Neutrophils: 50 %
Platelets: 259 10*3/uL (ref 150–379)
RBC: 4.16 x10E6/uL (ref 3.77–5.28)
RDW: 14.5 % (ref 12.3–15.4)
Retic Ct Pct: 1.2 % (ref 0.6–2.6)
TIBC: 386 ug/dL (ref 250–450)
UIBC: 270 ug/dL (ref 118–369)
VITAMIN B 12: 232 pg/mL (ref 211–946)
WBC: 5.4 10*3/uL (ref 3.4–10.8)

## 2015-07-30 LAB — VITAMIN D 25 HYDROXY (VIT D DEFICIENCY, FRACTURES): Vit D, 25-Hydroxy: 36.9 ng/mL (ref 30.0–100.0)

## 2015-08-31 ENCOUNTER — Ambulatory Visit: Admitting: Family Medicine

## 2015-09-02 ENCOUNTER — Encounter: Payer: Self-pay | Admitting: Family Medicine

## 2015-09-02 ENCOUNTER — Ambulatory Visit (INDEPENDENT_AMBULATORY_CARE_PROVIDER_SITE_OTHER): Admitting: Family Medicine

## 2015-09-02 VITALS — BP 123/71 | HR 76 | Temp 97.6°F | Ht 63.0 in | Wt 102.6 lb

## 2015-09-02 DIAGNOSIS — R200 Anesthesia of skin: Secondary | ICD-10-CM

## 2015-09-02 DIAGNOSIS — G8929 Other chronic pain: Secondary | ICD-10-CM

## 2015-09-02 DIAGNOSIS — Z0000 Encounter for general adult medical examination without abnormal findings: Secondary | ICD-10-CM | POA: Insufficient documentation

## 2015-09-02 DIAGNOSIS — R208 Other disturbances of skin sensation: Secondary | ICD-10-CM | POA: Diagnosis not present

## 2015-09-02 DIAGNOSIS — J42 Unspecified chronic bronchitis: Secondary | ICD-10-CM

## 2015-09-02 DIAGNOSIS — I10 Essential (primary) hypertension: Secondary | ICD-10-CM

## 2015-09-02 DIAGNOSIS — M549 Dorsalgia, unspecified: Secondary | ICD-10-CM

## 2015-09-02 MED ORDER — AMLODIPINE BESYLATE 10 MG PO TABS: 10.0000 mg | ORAL_TABLET | Freq: Every day | ORAL | Status: DC

## 2015-09-02 MED ORDER — BUDESONIDE-FORMOTEROL FUMARATE 160-4.5 MCG/ACT IN AERO: 2.0000 | INHALATION_SPRAY | Freq: Two times a day (BID) | RESPIRATORY_TRACT | Status: DC

## 2015-09-02 NOTE — Patient Instructions (Signed)
Jiles Harold tto see you!  We can refer you to a neurologist  I have sent your medication refills  We will arrange a mammogram  Lets see you again in 4 months

## 2015-09-02 NOTE — Progress Notes (Signed)
   HPI  Patient presents today here for follow-up COPD, and numbness, hypertension, and discussed mammogram.  COPD Doing well, using albuterol every 2-3 days. Using Symbicort regularly. Still smoking, considering quitting.  Chronic back pain With worsening sciatica, also worsening bilateral hand numbness lately.  Mammogram is due, she is willing. FOBT is not complete yet.  Hypertension Increased amlodipine to 10 mg, feels like it's helping quite a bit. No chest pain or leg swelling.  PMH: Smoking status noted ROS: Per HPI  Objective: BP 123/71 mmHg  Pulse 76  Temp(Src) 97.6 F (36.4 C) (Oral)  Ht '5\' 3"'$  (1.6 m)  Wt 102 lb 9.6 oz (46.539 kg)  BMI 18.18 kg/m2 Gen: NAD, alert, cooperative with exam HEENT: NCAT CV: RRR, good S1/S2, no murmur Resp: CTABL, no wheezes, non-labored Ext: No edema, warm Neuro: Alert and oriented, strength 5/5 and sensation intact in bilateral lower extremities  Assessment and plan:  # COPD Improved control recently, refill Symbicort Discussed smoking cessation  # Hand numbness and chronic back pain She considering a neurology referral, we discussed that at length and she decided to wait. We'll focus on home PT exercises  # Healthcare maintenance Mammogram ordered, discussed FOBT  # Hypertension Improved with amlodipine, continue    Orders Placed This Encounter  Procedures  . MM Digital Screening    Standing Status: Future     Number of Occurrences:      Standing Expiration Date: 11/01/2016    Scheduling Instructions:     [facility]    Order Specific Question:  Reason for Exam (SYMPTOM  OR DIAGNOSIS REQUIRED)    Answer:  screening    Order Specific Question:  Preferred imaging location?    Answer:  External    Meds ordered this encounter  Medications  . budesonide-formoterol (SYMBICORT) 160-4.5 MCG/ACT inhaler    Sig: Inhale 2 puffs into the lungs 2 (two) times daily.    Dispense:  3 Inhaler    Refill:  3  . amLODipine  (NORVASC) 10 MG tablet    Sig: Take 1 tablet (10 mg total) by mouth daily.    Dispense:  90 tablet    Refill:  [place], MD [facility] 09/02/2015, 1:31 PM

## 2015-09-08 ENCOUNTER — Encounter (HOSPITAL_COMMUNITY): Payer: Self-pay | Admitting: Emergency Medicine

## 2015-09-08 ENCOUNTER — Emergency Department (HOSPITAL_COMMUNITY)
Admission: EM | Admit: 2015-09-08 | Discharge: 2015-09-08 | Disposition: A | Discharge status: Home / Self Care | Attending: Emergency Medicine | Admitting: Emergency Medicine

## 2015-09-08 DIAGNOSIS — Z79899 Other long term (current) drug therapy: Secondary | ICD-10-CM | POA: Insufficient documentation

## 2015-09-08 DIAGNOSIS — Z7982 Long term (current) use of aspirin: Secondary | ICD-10-CM | POA: Insufficient documentation

## 2015-09-08 DIAGNOSIS — J449 Chronic obstructive pulmonary disease, unspecified: Secondary | ICD-10-CM | POA: Insufficient documentation

## 2015-09-08 DIAGNOSIS — Z79891 Long term (current) use of opiate analgesic: Secondary | ICD-10-CM | POA: Insufficient documentation

## 2015-09-08 DIAGNOSIS — M5441 Lumbago with sciatica, right side: Secondary | ICD-10-CM | POA: Diagnosis not present

## 2015-09-08 DIAGNOSIS — F17210 Nicotine dependence, cigarettes, uncomplicated: Secondary | ICD-10-CM | POA: Diagnosis not present

## 2015-09-08 DIAGNOSIS — M25551 Pain in right hip: Secondary | ICD-10-CM | POA: Diagnosis present

## 2015-09-08 DIAGNOSIS — J45909 Unspecified asthma, uncomplicated: Secondary | ICD-10-CM | POA: Diagnosis not present

## 2015-09-08 DIAGNOSIS — M1990 Unspecified osteoarthritis, unspecified site: Secondary | ICD-10-CM | POA: Insufficient documentation

## 2015-09-08 LAB — URINALYSIS, ROUTINE W REFLEX MICROSCOPIC
BILIRUBIN URINE: NEGATIVE
GLUCOSE, UA: NEGATIVE mg/dL
HGB URINE DIPSTICK: NEGATIVE
Ketones, ur: NEGATIVE mg/dL
Leukocytes, UA: NEGATIVE
Nitrite: NEGATIVE
PH: 5.5 (ref 5.0–8.0)
Protein, ur: NEGATIVE mg/dL
SPECIFIC GRAVITY, URINE: 1.015 (ref 1.005–1.030)

## 2015-09-08 LAB — CBC WITH DIFFERENTIAL/PLATELET
Basophils Absolute: 0 10*3/uL (ref 0.0–0.1)
Basophils Relative: 1 %
EOS ABS: 0.1 10*3/uL (ref 0.0–0.7)
Eosinophils Relative: 2 %
HEMATOCRIT: 39.6 % (ref 36.0–46.0)
HEMOGLOBIN: 12.3 g/dL (ref 12.0–15.0)
LYMPHS ABS: 1 10*3/uL (ref 0.7–4.0)
Lymphocytes Relative: 18 %
MCH: 29.2 pg (ref 26.0–34.0)
MCHC: 31.1 g/dL (ref 30.0–36.0)
MCV: 94.1 fL (ref 78.0–100.0)
MONO ABS: 0.6 10*3/uL (ref 0.1–1.0)
MONOS PCT: 10 %
NEUTROS PCT: 69 %
Neutro Abs: 4 10*3/uL (ref 1.7–7.7)
Platelets: 223 10*3/uL (ref 150–400)
RBC: 4.21 MIL/uL (ref 3.87–5.11)
RDW: 15.9 % — AB (ref 11.5–15.5)
WBC: 5.8 10*3/uL (ref 4.0–10.5)

## 2015-09-08 LAB — BASIC METABOLIC PANEL
Anion gap: 9 (ref 5–15)
BUN: 12 mg/dL (ref 6–20)
CHLORIDE: 107 mmol/L (ref 101–111)
CO2: 24 mmol/L (ref 22–32)
CREATININE: 0.59 mg/dL (ref 0.44–1.00)
Calcium: 8.5 mg/dL — ABNORMAL LOW (ref 8.9–10.3)
GFR calc Af Amer: >60 mL/min (ref >60)
GFR calc non Af Amer: >60 mL/min (ref >60)
Glucose, Bld: 108 mg/dL — ABNORMAL HIGH (ref 65–99)
Potassium: 3.3 mmol/L — ABNORMAL LOW (ref 3.5–5.1)
Sodium: 140 mmol/L (ref 135–145)

## 2015-09-08 MED ORDER — ONDANSETRON HCL 4 MG/2ML IJ SOLN
4.0000 mg | Freq: Once | INTRAMUSCULAR | Status: AC
  Administered 2015-09-08: 4 mg via INTRAVENOUS
  Filled 2015-09-08: qty 2

## 2015-09-08 MED ORDER — HYDROMORPHONE HCL 1 MG/ML IJ SOLN
1.0000 mg | Freq: Once | INTRAMUSCULAR | Status: AC
  Administered 2015-09-08: 1 mg via INTRAVENOUS
  Filled 2015-09-08: qty 1

## 2015-09-08 MED ORDER — DIAZEPAM 5 MG PO TABS
5.0000 mg | ORAL_TABLET | Freq: Once | ORAL | Status: AC
  Administered 2015-09-08: 5 mg via ORAL
  Filled 2015-09-08: qty 1

## 2015-09-08 MED ORDER — PREDNISONE 10 MG PO TABS: ORAL_TABLET | ORAL | Status: DC

## 2015-09-08 MED ORDER — KETOROLAC TROMETHAMINE 30 MG/ML IJ SOLN
30.0000 mg | Freq: Once | INTRAMUSCULAR | Status: AC
  Administered 2015-09-08: 30 mg via INTRAVENOUS
  Filled 2015-09-08: qty 1

## 2015-09-08 NOTE — ED Notes (Signed)
Pt reports RT hip pain that radiates down RT leg with no known injury/fall since Thursday. Pt states pain worsens with movement. No deformity or rotation noted. Pt has difficulty ambulating due to pain. Hx of sciatica.

## 2015-09-08 NOTE — ED Provider Notes (Signed)
CSN: [id number]     Arrival date & time 09/08/15  0840 History   First MD Initiated Contact with Patient 09/08/15 [phone]     Chief Complaint  Patient presents with  . Hip Pain     (Consider location/radiation/quality/duration/timing/severity/associated sxs/prior Treatment) HPI   Jasmine [name] is a 60 y.o. female with hx of OA and chronic back pain with a implanted neuro stimulator, who presents to the Emergency Department complaining of right hip and buttocks pain for 6 days.  She states the pain began after running several errands.  She describes a sharp, constant pain from her buttocks down her right leg to the top of her foot.  Improves slightly with certain positions.  She has been taking her routine pain medications without relief.  She denies known injury, fever, chills, urine or bowel changes, abdominal pain, and numbness of the LE's    Past Medical History  Diagnosis Date  . Allergy   . Anemia   . Anxiety   . Arthritis   . Asthma   . COPD (chronic obstructive pulmonary disease) ([place])   . Hypoglycemia   . Neuromuscular disorder [facility])    Past Surgical History  Procedure Laterality Date  . Cesarean section  1982  . Abdominal hysterectomy  2005  . Disk repair    . Implantation / placement epidural neurostimulator electrodes     Family History  Problem Relation Age of Onset  . Heart disease Mother   . Diabetes Mother   . Hypertension Mother   . Hyperlipidemia Mother   . COPD Mother   . COPD Father   . Diabetes Father   . Heart disease Father   . Cancer Father     throat  . Dementia Father   . Asthma Son    Social History  Substance Use Topics  . Smoking status: Current Every Day Smoker -- 1.00 packs/day    Types: Cigarettes  . Smokeless tobacco: None  . Alcohol Use: No   OB History    No data available     Review of Systems  Constitutional: Negative for fever.  Respiratory: Negative for shortness of breath.   Gastrointestinal: Negative for vomiting,  abdominal pain and constipation.  Genitourinary: Negative for dysuria, hematuria, flank pain, decreased urine volume and difficulty urinating.  Musculoskeletal: Positive for back pain. Negative for joint swelling.  Skin: Negative for rash.  Neurological: Negative for weakness and numbness.  All other systems reviewed and are negative.     Allergies  Augmentin; Darvon; Erythromycin; and Vibramycin  Home Medications   Prior to Admission medications   Medication Sig Start Date End Date Taking? Authorizing Provider  albuterol (PROVENTIL HFA;VENTOLIN HFA) 108 (90 Base) MCG/ACT inhaler Inhale 2 puffs into the lungs every 6 (six) hours as needed for wheezing or shortness of breath. 07/29/15  Yes Timmothy Euler, MD  albuterol (PROVENTIL) (2.5 MG/3ML) 0.083% nebulizer solution Take 3 mLs (2.5 mg total) by nebulization every 6 (six) hours as needed for wheezing or shortness of breath. 07/29/15  Yes Timmothy Euler, MD  amLODipine (NORVASC) 10 MG tablet Take 1 tablet (10 mg total) by mouth daily. 09/02/15  Yes Timmothy Euler, MD  Aspirin-Salicylamide-Caffeine (BC HEADACHE POWDER PO) Take 1 Package by mouth daily as needed (headaches).    Yes Historical Provider, MD  budesonide-formoterol (SYMBICORT) 160-4.5 MCG/ACT inhaler Inhale 2 puffs into the lungs 2 (two) times daily. 09/02/15  Yes Timmothy Euler, MD  cetirizine (ZYRTEC) 10 MG tablet Take  10 mg by mouth daily. Reported on 03/12/2015   Yes Historical Provider, MD  DULoxetine (CYMBALTA) 30 MG capsule Take 3 capsules (90 mg total) by mouth daily. Patient taking differently: Take 30 mg by mouth 3 (three) times daily.  07/29/15  Yes Timmothy Euler, MD  halobetasol (ULTRAVATE) 0.05 % cream Apply topically 2 (two) times daily. Avoid face, groin, and underarms, do not use more than 7 days consecutively 07/29/15  Yes Timmothy Euler, MD  HYDROcodone-acetaminophen (NORCO) 10-325 MG tablet Take 1 tablet by mouth every 6 (six) hours as needed for  moderate pain.  07/28/15  Yes Historical Provider, MD  HYSINGLA ER 60 MG T24A Take 1 tablet by mouth daily.  07/07/15  Yes Historical Provider, MD  mometasone (NASONEX) 50 MCG/ACT nasal spray Place 2 sprays into the nose daily. 07/29/15  Yes Timmothy Euler, MD  ondansetron (ZOFRAN-ODT) 8 MG disintegrating tablet Take 1 tablet (8 mg total) by mouth every 6 (six) hours as needed for nausea or vomiting. 03/19/15  Yes Claretta Fraise, MD  pantoprazole (PROTONIX) 40 MG tablet Take 1 tablet (40 mg total) by mouth daily. 07/29/15  Yes Timmothy Euler, MD  pregabalin (LYRICA) 150 MG capsule Take 1 capsule (150 mg total) by mouth 3 (three) times daily. 02/22/15  Yes Timmothy Euler, MD  tiZANidine (ZANAFLEX) 4 MG tablet Take 4 mg by mouth 3 (three) times daily.  07/26/15  Yes Historical Provider, MD  traZODone (DESYREL) 100 MG tablet Take 0.5-1 tablets (50-100 mg total) by mouth at bedtime. 02/22/15  Yes Timmothy Euler, MD   BP 138/76 mmHg  Pulse 85  Temp(Src) 98.4 F (36.9 C) (Oral)  Resp 14  Ht '5\' 3"'$  (1.6 m)  Wt 46.72 kg  BMI 18.25 kg/m2  SpO2 94% Physical Exam  Constitutional: She is oriented to person, place, and time. She appears well-developed and well-nourished. No distress.  HENT:  Head: Normocephalic and atraumatic.  Neck: Normal range of motion. Neck supple.  Cardiovascular: Normal rate, regular rhythm, normal heart sounds and intact distal pulses.   No murmur heard. Pulmonary/Chest: Effort normal and breath sounds normal. No respiratory distress.  Abdominal: Soft. She exhibits no distension. There is no tenderness. There is no rebound and no guarding.  Musculoskeletal: She exhibits tenderness. She exhibits no edema.       Lumbar back: She exhibits tenderness and pain. She exhibits normal range of motion, no swelling, no deformity, no laceration and normal pulse.  ttp of the lumbar spine and right SI joint and paraspinal muscles.  DP pulses are brisk and symmetrical.  Distal sensation  intact.  Pt has 4/5 strength against resistance of bilateral lower extremities.     Neurological: She is alert and oriented to person, place, and time. She has normal strength. No sensory deficit. She exhibits normal muscle tone. Coordination and gait normal.  Reflex Scores:      Patellar reflexes are 2+ on the right side and 2+ on the left side.      Achilles reflexes are 2+ on the right side and 2+ on the left side. Skin: Skin is warm and dry. No rash noted.  Nursing note and vitals reviewed.   ED Course  Procedures (including critical care time) Labs Review Labs Reviewed  CBC WITH DIFFERENTIAL/PLATELET - Abnormal; Notable for the following:    RDW 15.9 (*)    All other components within normal limits  BASIC METABOLIC PANEL - Abnormal; Notable for the following:    Potassium  3.3 (*)    Glucose, Bld 108 (*)    Calcium 8.5 (*)    All other components within normal limits  URINALYSIS, ROUTINE W REFLEX MICROSCOPIC (NOT AT [facility])    Imaging Review No results found. I have personally reviewed and evaluated these images and lab results as part of my medical decision-making.   EKG Interpretation None      Previous CT of L spine from May 2017 was reviewed by me and considered in my MDM  MDM   Final diagnoses:  Right-sided low back pain with right-sided sciatica    Patient is feeling better after medications. ambulates in the dept with a steady gait. Symptoms likely related to sciatica. No concerning symptoms for emergent neurological process. Patient agrees to discharge and follow-up with her pain management physician    Kem Parkinson, PA-C 09/09/15 0840  Ripley Fraise, MD 09/09/15 1535

## 2015-09-08 NOTE — Discharge Instructions (Signed)
°  Sciatica °Sciatica is pain, weakness, numbness, or tingling along your sciatic nerve. The nerve starts in the lower back and runs down the back of each leg. Nerve damage or certain conditions pinch or put pressure on the sciatic nerve. This causes the pain, weakness, and other discomforts of sciatica. °HOME CARE  °· Only take medicine as told by your doctor. °· Apply ice to the affected area for 20 minutes. Do this 3-4 times a day for the first 48-72 hours. Then try heat in the same way. °· Exercise, stretch, or do your usual activities if these do not make your pain worse. °· Go to physical therapy as told by your doctor. °· Keep all doctor visits as told. °· Do not wear high heels or shoes that are not supportive. °· Get a firm mattress if your mattress is too soft to lessen pain and discomfort. °GET HELP RIGHT AWAY IF:  °· You cannot control when you poop (bowel movement) or pee (urinate). °· You have more weakness in your lower back, lower belly (pelvis), butt (buttocks), or legs. °· You have redness or puffiness (swelling) of your back. °· You have a burning feeling when you pee. °· You have pain that gets worse when you lie down. °· You have pain that wakes you from your sleep. °· Your pain is worse than past pain. °· Your pain lasts longer than 4 weeks. °· You are suddenly losing weight without reason. °MAKE SURE YOU:  °· Understand these instructions. °· Will watch this condition. °· Will get help right away if you are not doing well or get worse. °  °This information is not intended to replace advice given to you by your health care provider. Make sure you discuss any questions you have with your health care provider. °  °Document Released: 11/30/2007 Document Revised: 11/11/2014 Document Reviewed: 07/02/2011 °Elsevier Interactive Patient Education ©2016 Elsevier Inc[employer] ° ° °

## 2015-09-08 NOTE — ED Notes (Signed)
EDP at bedside  

## 2015-09-09 ENCOUNTER — Telehealth: Payer: Self-pay | Admitting: Family Medicine

## 2015-09-10 NOTE — Telephone Encounter (Signed)
appt scheduled Pt notified 

## 2015-09-16 ENCOUNTER — Ambulatory Visit (INDEPENDENT_AMBULATORY_CARE_PROVIDER_SITE_OTHER): Admitting: Family Medicine

## 2015-09-16 ENCOUNTER — Encounter: Payer: Self-pay | Admitting: Family Medicine

## 2015-09-16 ENCOUNTER — Telehealth: Payer: Self-pay | Admitting: Family Medicine

## 2015-09-16 VITALS — BP 121/74 | HR 88 | Temp 99.4°F

## 2015-09-16 DIAGNOSIS — M549 Dorsalgia, unspecified: Secondary | ICD-10-CM | POA: Diagnosis not present

## 2015-09-16 DIAGNOSIS — G8929 Other chronic pain: Secondary | ICD-10-CM | POA: Diagnosis not present

## 2015-09-16 DIAGNOSIS — M5431 Sciatica, right side: Secondary | ICD-10-CM

## 2015-09-16 MED ORDER — PREDNISONE 10 MG PO TABS: ORAL_TABLET | ORAL | Status: DC

## 2015-09-16 NOTE — Telephone Encounter (Signed)
No, I like to try elevating legs and compression stockings (or ted hose available OTC) priro to fluid pills.    No swelling on exam  Laroy Apple, MD [facility] 09/16/2015, 1:55 PM

## 2015-09-16 NOTE — Patient Instructions (Signed)
Great to see you!  I have sent prednisone, we will work on a referral to the neurologist  Sciatica Sciatica is pain, weakness, numbness, or tingling along the path of the sciatic nerve. The nerve starts in the lower back and runs down the back of each leg. The nerve controls the muscles in the lower leg and in the back of the knee, while also providing sensation to the back of the thigh, lower leg, and the sole of your foot. Sciatica is a symptom of another medical condition. For instance, nerve damage or certain conditions, such as a herniated disk or bone spur on the spine, pinch or put pressure on the sciatic nerve. This causes the pain, weakness, or other sensations normally associated with sciatica. Generally, sciatica only affects one side of the body. CAUSES   Herniated or slipped disc.  Degenerative disk disease.  A pain disorder involving the narrow muscle in the buttocks (piriformis syndrome).  Pelvic injury or fracture.  Pregnancy.  Tumor (rare). SYMPTOMS  Symptoms can vary from mild to very severe. The symptoms usually travel from the low back to the buttocks and down the back of the leg. Symptoms can include:  Mild tingling or dull aches in the lower back, leg, or hip.  Numbness in the back of the calf or sole of the foot.  Burning sensations in the lower back, leg, or hip.  Sharp pains in the lower back, leg, or hip.  Leg weakness.  Severe back pain inhibiting movement. These symptoms may get worse with coughing, sneezing, laughing, or prolonged sitting or standing. Also, being overweight may worsen symptoms. DIAGNOSIS  Your caregiver will perform a physical exam to look for common symptoms of sciatica. He or she may ask you to do certain movements or activities that would trigger sciatic nerve pain. Other tests may be performed to find the cause of the sciatica. These may include:  Blood tests.  X-rays.  Imaging tests, such as an MRI or CT scan. TREATMENT    Treatment is directed at the cause of the sciatic pain. Sometimes, treatment is not necessary and the pain and discomfort goes away on its own. If treatment is needed, your caregiver may suggest:  Over-the-counter medicines to relieve pain.  Prescription medicines, such as anti-inflammatory medicine, muscle relaxants, or narcotics.  Applying heat or ice to the painful area.  Steroid injections to lessen pain, irritation, and inflammation around the nerve.  Reducing activity during periods of pain.  Exercising and stretching to strengthen your abdomen and improve flexibility of your spine. Your caregiver may suggest losing weight if the extra weight makes the back pain worse.  Physical therapy.  Surgery to eliminate what is pressing or pinching the nerve, such as a bone spur or part of a herniated disk. HOME CARE INSTRUCTIONS   Only take over-the-counter or prescription medicines for pain or discomfort as directed by your caregiver.  Apply ice to the affected area for 20 minutes, 3-4 times a day for the first 48-72 hours. Then try heat in the same way.  Exercise, stretch, or perform your usual activities if these do not aggravate your pain.  Attend physical therapy sessions as directed by your caregiver.  Keep all follow-up appointments as directed by your caregiver.  Do not wear high heels or shoes that do not provide proper support.  Check your mattress to see if it is too soft. A firm mattress may lessen your pain and discomfort. SEEK IMMEDIATE MEDICAL CARE IF:  You lose control of your bowel or bladder (incontinence).  You have increasing weakness in the lower back, pelvis, buttocks, or legs.  You have redness or swelling of your back.  You have a burning sensation when you urinate.  You have pain that gets worse when you lie down or awakens you at night.  Your pain is worse than you have experienced in the past.  Your pain is lasting longer than 4 weeks.  You  are suddenly losing weight without reason. MAKE SURE YOU:  Understand these instructions.  Will watch your condition.  Will get help right away if you are not doing well or get worse.   This information is not intended to replace advice given to you by your health care provider. Make sure you discuss any questions you have with your health care provider.   Document Released: 02/14/2001 Document Revised: 11/11/2014 Document Reviewed: 07/02/2011 Elsevier Interactive Patient Education [employer].

## 2015-09-16 NOTE — Progress Notes (Signed)
   HPI  Patient presents today here with continued right sided back pain.  Patient explained she's had a right-sided back pain/lateral right hip pain with radiation down her posterior leg onto her foot. She states that has not improved since the hospital except for with prednisone, is returned after prednisone is finished. She explains the pain is so intense that she often has shaking episodes that lasted an hour or more. She initially describes these as seizures, however she does not have a postictal state, she does not lose consciousness, and she does not have any loss of bladder control during the episodes.  She denies any bowel or bladder dysfunction, saddle anesthesia. She does have some right lower extremity weakness and numbness which are, she states, stable.  She's on chronic hydrocodone for pain management.  PMH: Smoking status noted ROS: Per HPI  Objective: BP 121/74 mmHg  Pulse 88  Temp(Src) 99.4 F (37.4 C) (Oral)  Ht   Wt  Gen: NAD, alert, cooperative with exam HEENT: NCAT, EOMI, PERRL CV: RRR, good S1/S2, no murmur Resp: CTABL, no wheezes, non-labored Ext: No edema, warm Neuro: Alert and oriented, strength 3/5 on the right lower extremity, 4/5 on the left lower extremity, 2+ patellar tendon reflex on the left, 1+ on the right Strength in upper extremities is 4/5 and symmetric, sensation is normal there Right lower extremity also numb to patient's description.  Musculoskeletal Negative Corky Sox test of the right leg Mild tenderness to palpation of right-sided paraspinal muscles in midline lumbar spine   Assessment and plan:  # Right-sided sciatica L4/L5 distribution with radiation onto the dorsal foot CT of the lumbar spine, patient has nerve stimulator in place preventing MRI Prednisone course Referral to neurology for their opinion, I do not believe at this point she has any surgical needs Low threshold for returning, low threshold for ER visit if she should  develop worsening weakness or uncontrolled pain.     Orders Placed This Encounter  Procedures  . CT Lumbar Spine Wo Contrast    Standing Status: Future     Number of Occurrences:      Standing Expiration Date: 12/16/2016    Order Specific Question:  Reason for Exam (SYMPTOM  OR DIAGNOSIS REQUIRED)    Answer:  R sided sciatica with decreased strength and reflexes    Order Specific Question:  Preferred imaging location?    Answer:  [facility]    Meds ordered this encounter  Medications  . predniSONE (DELTASONE) 10 MG tablet    Sig: Take 4 pills a day for 3 days, then 3 pills a day for 3 days, then 2 pills a day for 3 days, then 1 pill a day for 3 days, then stop    Dispense:  30 tablet    Refill:  [place], MD [facility] 09/16/2015, 1:34 PM

## 2015-09-16 NOTE — Telephone Encounter (Signed)
Pt aware of recommendations & to return if this does not alleviate her symptoms

## 2015-09-20 ENCOUNTER — Telehealth: Payer: Self-pay | Admitting: Family Medicine

## 2015-09-20 NOTE — Telephone Encounter (Signed)
Spouse called back about referral to [facility] for right side sciatica. Advised patient that referral was still being worked on and would get done as fast as we can. Spouse states that now her right foot is now numb and wants to know if he needs to bring her in to be seen? Spouse also requested a rx for a wheelchair and walker because patient is having trouble walking. Covering PCP, please advise.

## 2015-09-20 NOTE — Telephone Encounter (Signed)
I am fine with doing a prescription for the wheelchair and walker, from looking at Dr. Alen Bleacher note he recommended a low threshold for her going to the emergency department if pain worsens or numbness or weakness worsens. So she may need to go there. The other option for her is to go see a different neurologist besides Dr. Merlene Laughter and see if they have any appointment sooner. She is also able to come back here and see Dr. Wendi Snipes, he will be back tomorrow and discuss further options as needed

## 2015-09-20 NOTE — Telephone Encounter (Signed)
lmtcb

## 2015-09-21 ENCOUNTER — Encounter: Payer: Self-pay | Admitting: *Deleted

## 2015-09-21 NOTE — Telephone Encounter (Signed)
Called and discussed.   Pt with improved pain, still with some tremor that lasts for a few seconds. She is able to call and walk after the tremors. She does not have a postictal state.  Prednisone has helped the pain.  They are worried about worsening right foot numbness. They will call and see if neurology can work him in earlier tomorrow.  If they're unable we could work on a different neurology referral.  Low threshold for call back, follow up or if pain is uncontrolled or neurologic status suddenly changes going to the emergency room.  Laroy Apple, MD [facility] 09/21/2015, 7:33 PM

## 2015-09-23 ENCOUNTER — Other Ambulatory Visit: Payer: Self-pay | Admitting: Family Medicine

## 2015-09-23 ENCOUNTER — Telehealth: Payer: Self-pay | Admitting: Family Medicine

## 2015-09-23 NOTE — Telephone Encounter (Signed)
She should be on day 7 of a 12 day course.   I try to keep steroid treatment to short courses so I would ask her to wait until she finishsed what she has (in 4-5 more days) before deciding if she needs more.

## 2015-09-24 ENCOUNTER — Other Ambulatory Visit: Payer: Self-pay

## 2015-09-24 ENCOUNTER — Telehealth: Payer: Self-pay | Admitting: Family Medicine

## 2015-09-24 DIAGNOSIS — M79606 Pain in leg, unspecified: Secondary | ICD-10-CM | POA: Insufficient documentation

## 2015-09-24 NOTE — Telephone Encounter (Signed)
Jasmine [name] with CT appointment

## 2015-09-24 NOTE — Telephone Encounter (Signed)
Rx done and placed up front.

## 2015-09-28 ENCOUNTER — Ambulatory Visit (HOSPITAL_COMMUNITY)
Admission: RE | Admit: 2015-09-28 | Discharge: 2015-09-28 | Disposition: A | Discharge status: Home / Self Care | Source: Ambulatory Visit | Attending: Family Medicine | Admitting: Family Medicine

## 2015-09-28 ENCOUNTER — Other Ambulatory Visit: Payer: Self-pay | Admitting: Family Medicine

## 2015-09-28 DIAGNOSIS — G8929 Other chronic pain: Secondary | ICD-10-CM | POA: Diagnosis present

## 2015-09-28 DIAGNOSIS — I700 Atherosclerosis of aorta: Secondary | ICD-10-CM | POA: Diagnosis not present

## 2015-09-28 DIAGNOSIS — M4806 Spinal stenosis, lumbar region: Secondary | ICD-10-CM | POA: Insufficient documentation

## 2015-09-28 DIAGNOSIS — M5431 Sciatica, right side: Secondary | ICD-10-CM

## 2015-09-28 DIAGNOSIS — M5126 Other intervertebral disc displacement, lumbar region: Secondary | ICD-10-CM | POA: Insufficient documentation

## 2015-09-28 DIAGNOSIS — M549 Dorsalgia, unspecified: Secondary | ICD-10-CM | POA: Diagnosis present

## 2015-09-28 DIAGNOSIS — Z9889 Other specified postprocedural states: Secondary | ICD-10-CM | POA: Insufficient documentation

## 2015-09-30 ENCOUNTER — Ambulatory Visit (HOSPITAL_COMMUNITY)
Admission: RE | Admit: 2015-09-30 | Discharge: 2015-09-30 | Disposition: A | Discharge status: Home / Self Care | Source: Ambulatory Visit | Attending: Nurse Practitioner | Admitting: Nurse Practitioner

## 2015-09-30 ENCOUNTER — Other Ambulatory Visit (HOSPITAL_COMMUNITY): Payer: Self-pay | Admitting: Nurse Practitioner

## 2015-09-30 DIAGNOSIS — I70208 Unspecified atherosclerosis of native arteries of extremities, other extremity: Secondary | ICD-10-CM | POA: Diagnosis not present

## 2015-09-30 DIAGNOSIS — M25559 Pain in unspecified hip: Secondary | ICD-10-CM | POA: Diagnosis present

## 2015-09-30 DIAGNOSIS — M25551 Pain in right hip: Secondary | ICD-10-CM

## 2015-09-30 DIAGNOSIS — M8580 Other specified disorders of bone density and structure, unspecified site: Secondary | ICD-10-CM | POA: Diagnosis not present

## 2015-10-12 ENCOUNTER — Telehealth: Payer: Self-pay | Admitting: Family Medicine

## 2015-10-12 ENCOUNTER — Telehealth: Payer: Self-pay

## 2015-10-12 MED ORDER — PREDNISONE 10 MG PO TABS
ORAL_TABLET | ORAL | 0 refills | Status: DC
Start: 2015-10-12 — End: 2015-10-28

## 2015-10-12 NOTE — Telephone Encounter (Signed)
Pt requesting refill of Prednisone due to worsening back pain Please review and advise

## 2015-10-12 NOTE — Telephone Encounter (Signed)
Okay with refill of prednisone this time, we should be very cautious using prednisone frequently.  Please see additional phone note as well, she was referred to [place] [facility] because of a bulging disc and because she was having such severe pain and difficulty walking. However now that she is seeing neurology it is okay if she wants to avoid going to a Psychologist, sport and exercise.  Laroy Apple, MD [facility] 10/12/2015, 5:12 PM

## 2015-10-12 NOTE — Telephone Encounter (Signed)
See additional phone note from same day.   Okay with refill of prednisone this time, we should be very cautious using prednisone frequently.  Please see additional phone note as well, she was referred to [place] [facility] because of a bulging disc and because she was having such severe pain and difficulty walking. However now that she is seeing neurology it is okay if she wants to avoid going to a Psychologist, sport and exercise.   Laroy Apple, MD [facility] 10/12/2015, 5:13 PM

## 2015-10-13 NOTE — Telephone Encounter (Signed)
Husband aware and verbalizes understanding.  

## 2015-10-13 NOTE — Telephone Encounter (Signed)
No answer

## 2015-10-26 ENCOUNTER — Other Ambulatory Visit: Payer: Self-pay | Admitting: Neurosurgery

## 2015-10-26 DIAGNOSIS — M21371 Foot drop, right foot: Secondary | ICD-10-CM

## 2015-10-28 ENCOUNTER — Ambulatory Visit
Admission: RE | Admit: 2015-10-28 | Discharge: 2015-10-28 | Disposition: A | Discharge status: Home / Self Care | Source: Ambulatory Visit | Attending: Neurosurgery | Admitting: Neurosurgery

## 2015-10-28 VITALS — BP 135/64 | HR 75

## 2015-10-28 DIAGNOSIS — M549 Dorsalgia, unspecified: Principal | ICD-10-CM

## 2015-10-28 DIAGNOSIS — G8929 Other chronic pain: Secondary | ICD-10-CM

## 2015-10-28 DIAGNOSIS — M21371 Foot drop, right foot: Secondary | ICD-10-CM

## 2015-10-28 MED ORDER — DIAZEPAM 5 MG PO TABS
5.0000 mg | ORAL_TABLET | Freq: Once | ORAL | Status: AC
  Administered 2015-10-28: 5 mg via ORAL

## 2015-10-28 MED ORDER — IOPAMIDOL (ISOVUE-M 200) INJECTION 41%
18.0000 mL | Freq: Once | INTRAMUSCULAR | Status: AC
  Administered 2015-10-28: 18 mL via INTRATHECAL

## 2015-10-28 MED ORDER — ONDANSETRON HCL 4 MG/2ML IJ SOLN: 4.0000 mg | Freq: Four times a day (QID) | INTRAMUSCULAR | Status: DC | PRN

## 2015-10-28 NOTE — Discharge Instructions (Signed)
Myelogram Discharge Instructions  1. Go home and rest quietly for the next 24 hours.  It is important to lie flat for the next 24 hours.  Get up only to go to the restroom.  You may lie in the bed or on a couch on your back, your stomach, your left side or your right side.  You may have one pillow under your head.  You may have pillows between your knees while you are on your side or under your knees while you are on your back.  2. DO NOT drive today.  Recline the seat as far back as it will go, while still wearing your seat belt, on the way home.  3. You may get up to go to the bathroom as needed.  You may sit up for 10 minutes to eat.  You may resume your normal diet and medications unless otherwise indicated.  Drink lots of extra fluids today and tomorrow.  4. The incidence of headache, nausea, or vomiting is about 5% (one in 20 patients).  If you develop a headache, lie flat and drink plenty of fluids until the headache goes away.  Caffeinated beverages may be helpful.  If you develop severe nausea and vomiting or a headache that does not go away with flat bed rest, call [phone].  5. You may resume normal activities after your 24 hours of bed rest is over; however, do not exert yourself strongly or do any heavy lifting tomorrow. If when you get up you have a headache when standing, go back to bed and force fluids for another 24 hours.  6. Call your physician for a follow-up appointment.  The results of your myelogram will be sent directly to your physician by the following day.  7. If you have any questions or if complications develop after you arrive home, please call [phone].  Discharge instructions have been explained to the patient.  The patient, or the person responsible for the patient, fully understands these instructions.       May resume Cymbalta and Trazodone on Aug. 25, 2017, after 11:30 am.

## 2015-10-28 NOTE — Progress Notes (Signed)
Pt states she has been off Cymbalta and Trazodone for the past  2 day.s

## 2015-10-29 ENCOUNTER — Telehealth: Payer: Self-pay

## 2015-10-29 NOTE — Telephone Encounter (Signed)
I spoke with patient after her myelogram here yesterday.  She said she's doing great!  No pain, no headache.  She didn't even feel the dye going in yesterday which was so much better than a previous myelo she had had.  Brita Romp, RN

## 2015-11-05 ENCOUNTER — Other Ambulatory Visit: Payer: Self-pay | Admitting: Neurosurgery

## 2015-11-10 ENCOUNTER — Encounter (HOSPITAL_COMMUNITY): Payer: Self-pay | Admitting: *Deleted

## 2015-11-10 ENCOUNTER — Other Ambulatory Visit: Payer: Self-pay | Admitting: Neurosurgery

## 2015-11-10 MED ORDER — VANCOMYCIN HCL IN DEXTROSE 1-5 GM/200ML-% IV SOLN
1000.0000 mg | INTRAVENOUS | Status: AC
  Administered 2015-11-11: 1000 mg via INTRAVENOUS
  Filled 2015-11-10: qty 200

## 2015-11-10 MED ORDER — MUPIROCIN 2 % EX OINT
1.0000 "application " | TOPICAL_OINTMENT | Freq: Once | CUTANEOUS | Status: AC
  Administered 2015-11-11: 1 via TOPICAL
  Filled 2015-11-10: qty 22

## 2015-11-10 NOTE — Progress Notes (Signed)
Pt denies cardiac history, chest pain or sob. States she was told she had a heart murmur, but it has never given her any problems.

## 2015-11-11 ENCOUNTER — Encounter (HOSPITAL_COMMUNITY)
Admission: RE | Disposition: A | Discharge status: Home / Self Care | Payer: Self-pay | Source: Ambulatory Visit | Attending: Neurosurgery

## 2015-11-11 ENCOUNTER — Inpatient Hospital Stay (HOSPITAL_COMMUNITY)

## 2015-11-11 ENCOUNTER — Inpatient Hospital Stay (HOSPITAL_COMMUNITY): Admitting: Anesthesiology

## 2015-11-11 ENCOUNTER — Encounter (HOSPITAL_COMMUNITY): Payer: Self-pay | Admitting: General Practice

## 2015-11-11 ENCOUNTER — Inpatient Hospital Stay (HOSPITAL_COMMUNITY)
Admission: RE | Admit: 2015-11-11 | Discharge: 2015-11-16 | DRG: 460 | Disposition: A | Discharge status: Home / Self Care | Source: Ambulatory Visit | Attending: Neurosurgery | Admitting: Neurosurgery

## 2015-11-11 DIAGNOSIS — G629 Polyneuropathy, unspecified: Secondary | ICD-10-CM | POA: Diagnosis present

## 2015-11-11 DIAGNOSIS — R0602 Shortness of breath: Secondary | ICD-10-CM

## 2015-11-11 DIAGNOSIS — D62 Acute posthemorrhagic anemia: Secondary | ICD-10-CM | POA: Diagnosis present

## 2015-11-11 DIAGNOSIS — I10 Essential (primary) hypertension: Secondary | ICD-10-CM | POA: Diagnosis present

## 2015-11-11 DIAGNOSIS — Z981 Arthrodesis status: Secondary | ICD-10-CM | POA: Diagnosis not present

## 2015-11-11 DIAGNOSIS — Z7951 Long term (current) use of inhaled steroids: Secondary | ICD-10-CM

## 2015-11-11 DIAGNOSIS — F419 Anxiety disorder, unspecified: Secondary | ICD-10-CM | POA: Diagnosis present

## 2015-11-11 DIAGNOSIS — Z888 Allergy status to other drugs, medicaments and biological substances status: Secondary | ICD-10-CM | POA: Diagnosis not present

## 2015-11-11 DIAGNOSIS — Y838 Other surgical procedures as the cause of abnormal reaction of the patient, or of later complication, without mention of misadventure at the time of the procedure: Secondary | ICD-10-CM | POA: Diagnosis present

## 2015-11-11 DIAGNOSIS — M21371 Foot drop, right foot: Secondary | ICD-10-CM | POA: Diagnosis present

## 2015-11-11 DIAGNOSIS — M960 Pseudarthrosis after fusion or arthrodesis: Secondary | ICD-10-CM | POA: Diagnosis present

## 2015-11-11 DIAGNOSIS — M4806 Spinal stenosis, lumbar region: Principal | ICD-10-CM | POA: Diagnosis present

## 2015-11-11 DIAGNOSIS — T40605A Adverse effect of unspecified narcotics, initial encounter: Secondary | ICD-10-CM | POA: Diagnosis not present

## 2015-11-11 DIAGNOSIS — K219 Gastro-esophageal reflux disease without esophagitis: Secondary | ICD-10-CM | POA: Diagnosis present

## 2015-11-11 DIAGNOSIS — I959 Hypotension, unspecified: Secondary | ICD-10-CM | POA: Diagnosis present

## 2015-11-11 DIAGNOSIS — Z881 Allergy status to other antibiotic agents status: Secondary | ICD-10-CM

## 2015-11-11 DIAGNOSIS — F17210 Nicotine dependence, cigarettes, uncomplicated: Secondary | ICD-10-CM | POA: Diagnosis present

## 2015-11-11 DIAGNOSIS — J449 Chronic obstructive pulmonary disease, unspecified: Secondary | ICD-10-CM | POA: Diagnosis present

## 2015-11-11 DIAGNOSIS — Z79899 Other long term (current) drug therapy: Secondary | ICD-10-CM

## 2015-11-11 DIAGNOSIS — M48061 Spinal stenosis, lumbar region without neurogenic claudication: Secondary | ICD-10-CM | POA: Diagnosis present

## 2015-11-11 DIAGNOSIS — Z419 Encounter for procedure for purposes other than remedying health state, unspecified: Secondary | ICD-10-CM

## 2015-11-11 DIAGNOSIS — M545 Low back pain: Secondary | ICD-10-CM | POA: Diagnosis present

## 2015-11-11 HISTORY — DX: Headache, unspecified: R51.9

## 2015-11-11 HISTORY — DX: Reserved for inherently not codable concepts without codable children: IMO0001

## 2015-11-11 HISTORY — DX: Unspecified convulsions: R56.9

## 2015-11-11 HISTORY — DX: Cardiac murmur, unspecified: R01.1

## 2015-11-11 HISTORY — DX: Carpal tunnel syndrome, unspecified upper limb: G56.00

## 2015-11-11 HISTORY — DX: Pneumonia, unspecified organism: J18.9

## 2015-11-11 HISTORY — DX: Polyneuropathy, unspecified: G62.9

## 2015-11-11 HISTORY — DX: Essential (primary) hypertension: I10

## 2015-11-11 HISTORY — DX: Other complications of anesthesia, initial encounter: T88.59XA

## 2015-11-11 HISTORY — DX: Gastro-esophageal reflux disease without esophagitis: K21.9

## 2015-11-11 HISTORY — DX: Headache: R51

## 2015-11-11 HISTORY — DX: Adverse effect of unspecified anesthetic, initial encounter: T41.45XA

## 2015-11-11 LAB — CBC
HEMATOCRIT: 41 % (ref 36.0–46.0)
Hemoglobin: 12.8 g/dL (ref 12.0–15.0)
MCH: 29 pg (ref 26.0–34.0)
MCHC: 31.2 g/dL (ref 30.0–36.0)
MCV: 93 fL (ref 78.0–100.0)
Platelets: 235 10*3/uL (ref 150–400)
RBC: 4.41 MIL/uL (ref 3.87–5.11)
RDW: 15.8 % — AB (ref 11.5–15.5)
WBC: 8.3 10*3/uL (ref 4.0–10.5)

## 2015-11-11 LAB — BASIC METABOLIC PANEL
ANION GAP: 10 (ref 5–15)
BUN: 8 mg/dL (ref 6–20)
CHLORIDE: 108 mmol/L (ref 101–111)
CO2: 23 mmol/L (ref 22–32)
Calcium: 9.6 mg/dL (ref 8.9–10.3)
Creatinine, Ser: 0.5 mg/dL (ref 0.44–1.00)
Glucose, Bld: 101 mg/dL — ABNORMAL HIGH (ref 65–99)
POTASSIUM: 3.7 mmol/L (ref 3.5–5.1)
SODIUM: 141 mmol/L (ref 135–145)

## 2015-11-11 LAB — ABO/RH: ABO/RH(D): A POS

## 2015-11-11 LAB — SURGICAL PCR SCREEN
MRSA, PCR: NEGATIVE
STAPHYLOCOCCUS AUREUS: NEGATIVE

## 2015-11-11 SURGERY — POSTERIOR LUMBAR FUSION 1 LEVEL
Anesthesia: General

## 2015-11-11 MED ORDER — SODIUM CHLORIDE 0.9% FLUSH: 3.0000 mL | INTRAVENOUS | Status: DC | PRN

## 2015-11-11 MED ORDER — SODIUM CHLORIDE 0.9% FLUSH
3.0000 mL | Freq: Two times a day (BID) | INTRAVENOUS | Status: DC
  Administered 2015-11-12: 10 mL via INTRAVENOUS
  Administered 2015-11-12 – 2015-11-16 (×8): 3 mL via INTRAVENOUS

## 2015-11-11 MED ORDER — TIZANIDINE HCL 4 MG PO TABS
6.0000 mg | ORAL_TABLET | Freq: Three times a day (TID) | ORAL | Status: DC
  Administered 2015-11-11 – 2015-11-16 (×15): 6 mg via ORAL
  Filled 2015-11-11 (×16): qty 1

## 2015-11-11 MED ORDER — HYDROMORPHONE HCL 1 MG/ML IJ SOLN
INTRAMUSCULAR | Status: AC
  Filled 2015-11-11: qty 1

## 2015-11-11 MED ORDER — PROPOFOL 10 MG/ML IV BOLUS
INTRAVENOUS | Status: DC | PRN
  Administered 2015-11-11: 150 mg via INTRAVENOUS

## 2015-11-11 MED ORDER — FENTANYL CITRATE (PF) 100 MCG/2ML IJ SOLN
INTRAMUSCULAR | Status: AC
  Filled 2015-11-11: qty 2

## 2015-11-11 MED ORDER — OXYCODONE-ACETAMINOPHEN 5-325 MG PO TABS
1.0000 | ORAL_TABLET | ORAL | Status: DC | PRN
  Administered 2015-11-11 – 2015-11-13 (×9): 2 via ORAL
  Filled 2015-11-11 (×10): qty 2

## 2015-11-11 MED ORDER — ONDANSETRON HCL 4 MG/2ML IJ SOLN
INTRAMUSCULAR | Status: AC
  Filled 2015-11-11: qty 2

## 2015-11-11 MED ORDER — SODIUM CHLORIDE 0.9 % IV SOLN: 250.0000 mL | INTRAVENOUS | Status: DC

## 2015-11-11 MED ORDER — KETAMINE HCL-SODIUM CHLORIDE 100-0.9 MG/10ML-% IV SOSY
PREFILLED_SYRINGE | INTRAVENOUS | Status: AC
  Filled 2015-11-11: qty 10

## 2015-11-11 MED ORDER — ALBUMIN HUMAN 5 % IV SOLN
INTRAVENOUS | Status: DC | PRN
Start: 2015-11-11 — End: 2015-11-11
  Administered 2015-11-11: 18:00:00 via INTRAVENOUS

## 2015-11-11 MED ORDER — OXYCODONE HCL 5 MG/5ML PO SOLN: 5.0000 mg | Freq: Once | ORAL | Status: DC | PRN

## 2015-11-11 MED ORDER — ROCURONIUM BROMIDE 100 MG/10ML IV SOLN
INTRAVENOUS | Status: DC | PRN
  Administered 2015-11-11: 40 mg via INTRAVENOUS
  Administered 2015-11-11: 60 mg via INTRAVENOUS

## 2015-11-11 MED ORDER — PROPOFOL 10 MG/ML IV BOLUS
INTRAVENOUS | Status: AC
  Filled 2015-11-11: qty 20

## 2015-11-11 MED ORDER — PHENOL 1.4 % MT LIQD: 1.0000 | OROMUCOSAL | Status: DC | PRN

## 2015-11-11 MED ORDER — CHLORHEXIDINE GLUCONATE CLOTH 2 % EX PADS: 6.0000 | MEDICATED_PAD | Freq: Once | CUTANEOUS | Status: DC

## 2015-11-11 MED ORDER — DEXTROSE 5 % IV SOLN
500.0000 mg | Freq: Four times a day (QID) | INTRAVENOUS | Status: DC | PRN
  Filled 2015-11-11: qty 5

## 2015-11-11 MED ORDER — ACETAMINOPHEN 10 MG/ML IV SOLN
INTRAVENOUS | Status: AC
  Filled 2015-11-11: qty 100

## 2015-11-11 MED ORDER — ONDANSETRON HCL 4 MG/2ML IJ SOLN
INTRAMUSCULAR | Status: DC | PRN
  Administered 2015-11-11: 4 mg via INTRAVENOUS

## 2015-11-11 MED ORDER — DOCUSATE SODIUM 100 MG PO CAPS
100.0000 mg | ORAL_CAPSULE | Freq: Two times a day (BID) | ORAL | Status: DC
  Administered 2015-11-11 – 2015-11-16 (×10): 100 mg via ORAL
  Filled 2015-11-11 (×10): qty 1

## 2015-11-11 MED ORDER — DULOXETINE HCL 60 MG PO CPEP
90.0000 mg | ORAL_CAPSULE | Freq: Two times a day (BID) | ORAL | Status: DC
  Administered 2015-11-11 – 2015-11-16 (×10): 90 mg via ORAL
  Filled 2015-11-11 (×10): qty 1

## 2015-11-11 MED ORDER — HYDROCODONE BITARTRATE ER 60 MG PO T24A
1.0000 | EXTENDED_RELEASE_TABLET | Freq: Every day | ORAL | Status: DC
  Administered 2015-11-12: 11:00:00 via ORAL
  Administered 2015-11-13: 60 via ORAL

## 2015-11-11 MED ORDER — HYDROMORPHONE HCL 1 MG/ML IJ SOLN
0.2500 mg | INTRAMUSCULAR | Status: DC | PRN
  Administered 2015-11-11 (×4): 0.5 mg via INTRAVENOUS

## 2015-11-11 MED ORDER — ACETAMINOPHEN 650 MG RE SUPP: 650.0000 mg | RECTAL | Status: DC | PRN

## 2015-11-11 MED ORDER — HYDROMORPHONE HCL 1 MG/ML IJ SOLN
0.5000 mg | INTRAMUSCULAR | Status: DC | PRN
  Administered 2015-11-11 – 2015-11-13 (×8): 1 mg via INTRAVENOUS
  Filled 2015-11-11 (×10): qty 1

## 2015-11-11 MED ORDER — LORATADINE 10 MG PO TABS: 10.0000 mg | ORAL_TABLET | Freq: Every day | ORAL | Status: DC | PRN

## 2015-11-11 MED ORDER — FENTANYL CITRATE (PF) 100 MCG/2ML IJ SOLN
INTRAMUSCULAR | Status: DC | PRN
  Administered 2015-11-11: 100 ug via INTRAVENOUS
  Administered 2015-11-11: 50 ug via INTRAVENOUS
  Administered 2015-11-11: 100 ug via INTRAVENOUS
  Administered 2015-11-11: 50 ug via INTRAVENOUS
  Administered 2015-11-11 (×3): 100 ug via INTRAVENOUS

## 2015-11-11 MED ORDER — LIDOCAINE 2% (20 MG/ML) 5 ML SYRINGE
INTRAMUSCULAR | Status: AC
  Filled 2015-11-11: qty 5

## 2015-11-11 MED ORDER — PHENYLEPHRINE HCL 10 MG/ML IJ SOLN
INTRAMUSCULAR | Status: AC
  Filled 2015-11-11: qty 2

## 2015-11-11 MED ORDER — VANCOMYCIN HCL IN DEXTROSE 1-5 GM/200ML-% IV SOLN
1000.0000 mg | Freq: Once | INTRAVENOUS | Status: DC
  Filled 2015-11-11: qty 200

## 2015-11-11 MED ORDER — 0.9 % SODIUM CHLORIDE (POUR BTL) OPTIME
TOPICAL | Status: DC | PRN
  Administered 2015-11-11: 1000 mL

## 2015-11-11 MED ORDER — LIDOCAINE HCL (CARDIAC) 20 MG/ML IV SOLN
INTRAVENOUS | Status: DC | PRN
  Administered 2015-11-11: 50 mg via INTRAVENOUS

## 2015-11-11 MED ORDER — TRAZODONE HCL 100 MG PO TABS
100.0000 mg | ORAL_TABLET | Freq: Every day | ORAL | Status: DC
Start: 2015-11-11 — End: 2015-11-16
  Administered 2015-11-11 – 2015-11-15 (×5): 100 mg via ORAL
  Filled 2015-11-11 (×6): qty 1

## 2015-11-11 MED ORDER — PANTOPRAZOLE SODIUM 40 MG PO TBEC
40.0000 mg | DELAYED_RELEASE_TABLET | Freq: Every day | ORAL | Status: DC
  Administered 2015-11-12 – 2015-11-16 (×5): 40 mg via ORAL
  Filled 2015-11-11 (×5): qty 1

## 2015-11-11 MED ORDER — BISACODYL 10 MG RE SUPP: 10.0000 mg | Freq: Every day | RECTAL | Status: DC | PRN

## 2015-11-11 MED ORDER — LACTATED RINGERS IV SOLN
INTRAVENOUS | Status: DC
  Administered 2015-11-11 (×4): via INTRAVENOUS

## 2015-11-11 MED ORDER — ONDANSETRON HCL 4 MG/2ML IJ SOLN
4.0000 mg | INTRAMUSCULAR | Status: DC | PRN
  Administered 2015-11-12: 4 mg via INTRAVENOUS
  Filled 2015-11-11: qty 2

## 2015-11-11 MED ORDER — MOMETASONE FURO-FORMOTEROL FUM 200-5 MCG/ACT IN AERO
2.0000 | INHALATION_SPRAY | Freq: Two times a day (BID) | RESPIRATORY_TRACT | Status: DC
  Administered 2015-11-12 – 2015-11-15 (×8): 2 via RESPIRATORY_TRACT
  Filled 2015-11-11: qty 8.8

## 2015-11-11 MED ORDER — ACETAMINOPHEN 10 MG/ML IV SOLN
INTRAVENOUS | Status: DC | PRN
  Administered 2015-11-11: 1000 mg via INTRAVENOUS

## 2015-11-11 MED ORDER — VANCOMYCIN HCL IN DEXTROSE 1-5 GM/200ML-% IV SOLN
1000.0000 mg | Freq: Once | INTRAVENOUS | Status: AC
  Administered 2015-11-12: 1000 mg via INTRAVENOUS
  Filled 2015-11-11: qty 200

## 2015-11-11 MED ORDER — BUPIVACAINE LIPOSOME 1.3 % IJ SUSP
INTRAMUSCULAR | Status: DC | PRN
  Administered 2015-11-11: 20 mL

## 2015-11-11 MED ORDER — METHOCARBAMOL 500 MG PO TABS
500.0000 mg | ORAL_TABLET | Freq: Four times a day (QID) | ORAL | Status: DC | PRN
  Administered 2015-11-11 – 2015-11-16 (×10): 500 mg via ORAL
  Filled 2015-11-11 (×12): qty 1

## 2015-11-11 MED ORDER — FENTANYL CITRATE (PF) 100 MCG/2ML IJ SOLN
INTRAMUSCULAR | Status: AC
  Filled 2015-11-11: qty 4

## 2015-11-11 MED ORDER — ACETAMINOPHEN 325 MG PO TABS
650.0000 mg | ORAL_TABLET | ORAL | Status: DC | PRN
  Administered 2015-11-15: 650 mg via ORAL
  Filled 2015-11-11: qty 2

## 2015-11-11 MED ORDER — SENNA 8.6 MG PO TABS
1.0000 | ORAL_TABLET | Freq: Two times a day (BID) | ORAL | Status: DC
  Administered 2015-11-11 – 2015-11-16 (×10): 8.6 mg via ORAL
  Filled 2015-11-11 (×10): qty 1

## 2015-11-11 MED ORDER — FENTANYL CITRATE (PF) 100 MCG/2ML IJ SOLN
INTRAMUSCULAR | Status: AC
  Filled 2015-11-11: qty 6

## 2015-11-11 MED ORDER — SUGAMMADEX SODIUM 200 MG/2ML IV SOLN
INTRAVENOUS | Status: DC | PRN
  Administered 2015-11-11: 96.2 mg via INTRAVENOUS

## 2015-11-11 MED ORDER — MIDAZOLAM HCL 5 MG/5ML IJ SOLN
INTRAMUSCULAR | Status: DC | PRN
  Administered 2015-11-11: 2 mg via INTRAVENOUS

## 2015-11-11 MED ORDER — ALBUTEROL SULFATE HFA 108 (90 BASE) MCG/ACT IN AERS: 2.0000 | INHALATION_SPRAY | Freq: Four times a day (QID) | RESPIRATORY_TRACT | Status: DC | PRN

## 2015-11-11 MED ORDER — SODIUM CHLORIDE 0.9 % IR SOLN
Status: DC | PRN
  Administered 2015-11-11: 500 mL

## 2015-11-11 MED ORDER — AMLODIPINE BESYLATE 10 MG PO TABS
10.0000 mg | ORAL_TABLET | Freq: Every day | ORAL | Status: DC
  Administered 2015-11-12 – 2015-11-13 (×2): 10 mg via ORAL
  Filled 2015-11-11 (×2): qty 1

## 2015-11-11 MED ORDER — THROMBIN 5000 UNITS EX SOLR
OROMUCOSAL | Status: DC | PRN
  Administered 2015-11-11 (×2): 5 mL via TOPICAL

## 2015-11-11 MED ORDER — BUPIVACAINE LIPOSOME 1.3 % IJ SUSP
20.0000 mL | Freq: Once | INTRAMUSCULAR | Status: DC
  Filled 2015-11-11: qty 20

## 2015-11-11 MED ORDER — MENTHOL 3 MG MT LOZG: 1.0000 | LOZENGE | OROMUCOSAL | Status: DC | PRN

## 2015-11-11 MED ORDER — THROMBIN 20000 UNITS EX SOLR
CUTANEOUS | Status: DC | PRN
  Administered 2015-11-11: 20 mL via TOPICAL

## 2015-11-11 MED ORDER — PHENYLEPHRINE HCL 10 MG/ML IJ SOLN
INTRAMUSCULAR | Status: DC | PRN
  Administered 2015-11-11: 20 ug/min via INTRAVENOUS

## 2015-11-11 MED ORDER — OXYCODONE HCL 5 MG PO TABS: 5.0000 mg | ORAL_TABLET | Freq: Once | ORAL | Status: DC | PRN

## 2015-11-11 MED ORDER — PHENYLEPHRINE HCL 10 MG/ML IJ SOLN
INTRAMUSCULAR | Status: DC | PRN
  Administered 2015-11-11 (×5): 80 ug via INTRAVENOUS

## 2015-11-11 MED ORDER — SODIUM CHLORIDE 0.9 % IV SOLN
INTRAVENOUS | Status: DC
  Administered 2015-11-12 – 2015-11-14 (×2): via INTRAVENOUS

## 2015-11-11 MED ORDER — THROMBIN 20000 UNITS EX SOLR: OROMUCOSAL | Status: DC | PRN

## 2015-11-11 MED ORDER — KETAMINE HCL 10 MG/ML IJ SOLN
INTRAMUSCULAR | Status: DC | PRN
  Administered 2015-11-11: 25 mg via INTRAVENOUS

## 2015-11-11 MED ORDER — MIDAZOLAM HCL 2 MG/2ML IJ SOLN
INTRAMUSCULAR | Status: AC
  Filled 2015-11-11: qty 2

## 2015-11-11 MED ORDER — DEXAMETHASONE SODIUM PHOSPHATE 10 MG/ML IJ SOLN
INTRAMUSCULAR | Status: DC | PRN
  Administered 2015-11-11: 10 mg via INTRAVENOUS

## 2015-11-11 MED ORDER — ALBUTEROL SULFATE (2.5 MG/3ML) 0.083% IN NEBU
2.5000 mg | INHALATION_SOLUTION | Freq: Four times a day (QID) | RESPIRATORY_TRACT | Status: DC | PRN
  Administered 2015-11-14: 2.5 mg via RESPIRATORY_TRACT
  Filled 2015-11-11: qty 3

## 2015-11-11 MED ORDER — SUGAMMADEX SODIUM 200 MG/2ML IV SOLN
INTRAVENOUS | Status: AC
  Filled 2015-11-11: qty 2

## 2015-11-11 SURGICAL SUPPLY — 85 items
BAG DECANTER FOR FLEXI CONT (MISCELLANEOUS) ×2 IMPLANT
BENZOIN TINCTURE PRP APPL 2/3 (GAUZE/BANDAGES/DRESSINGS) IMPLANT
BIT DRILL 3.5 POWEREASE (BIT) ×2 IMPLANT
BLADE CLIPPER SURG (BLADE) IMPLANT
BLADE SURG 11 STRL SS (BLADE) ×2 IMPLANT
BUR MATCHSTICK NEURO 3.0 LAGG (BURR) ×2 IMPLANT
BUR PRECISION FLUTE 5.0 (BURR) ×2 IMPLANT
CANISTER SUCT 3000ML PPV (MISCELLANEOUS) ×2 IMPLANT
CONT SPEC 4OZ CLIKSEAL STRL BL (MISCELLANEOUS) ×2 IMPLANT
COVER BACK TABLE 60X90IN (DRAPES) ×2 IMPLANT
DECANTER SPIKE VIAL GLASS SM (MISCELLANEOUS) ×2 IMPLANT
DERMABOND ADVANCED (GAUZE/BANDAGES/DRESSINGS) ×1
DERMABOND ADVANCED .7 DNX12 (GAUZE/BANDAGES/DRESSINGS) ×1 IMPLANT
DEVICE INTERBODY ELEVATE 23X8 (Cage) ×4 IMPLANT
DRAPE C-ARM 42X72 X-RAY (DRAPES) ×2 IMPLANT
DRAPE C-ARMOR (DRAPES) ×2 IMPLANT
DRAPE LAPAROTOMY 100X72X124 (DRAPES) ×2 IMPLANT
DRAPE POUCH INSTRU U-SHP 10X18 (DRAPES) ×2 IMPLANT
DRAPE SURG 17X23 STRL (DRAPES) ×2 IMPLANT
DRSG OPSITE POSTOP 4X8 (GAUZE/BANDAGES/DRESSINGS) ×2 IMPLANT
DURAPREP 26ML APPLICATOR (WOUND CARE) ×2 IMPLANT
ELECT REM PT RETURN 9FT ADLT (ELECTROSURGICAL) ×2
ELECTRODE REM PT RTRN 9FT ADLT (ELECTROSURGICAL) ×1 IMPLANT
GAUZE SPONGE 4X4 12PLY STRL (GAUZE/BANDAGES/DRESSINGS) IMPLANT
GAUZE SPONGE 4X4 16PLY XRAY LF (GAUZE/BANDAGES/DRESSINGS) IMPLANT
GLOVE BIO SURGEON STRL SZ 6.5 (GLOVE) ×6 IMPLANT
GLOVE BIO SURGEON STRL SZ8 (GLOVE) ×2 IMPLANT
GLOVE BIOGEL PI IND STRL 6.5 (GLOVE) ×1 IMPLANT
GLOVE BIOGEL PI IND STRL 7.0 (GLOVE) ×1 IMPLANT
GLOVE BIOGEL PI IND STRL 7.5 (GLOVE) ×3 IMPLANT
GLOVE BIOGEL PI IND STRL 8 (GLOVE) ×4 IMPLANT
GLOVE BIOGEL PI INDICATOR 6.5 (GLOVE) ×1
GLOVE BIOGEL PI INDICATOR 7.0 (GLOVE) ×1
GLOVE BIOGEL PI INDICATOR 7.5 (GLOVE) ×3
GLOVE BIOGEL PI INDICATOR 8 (GLOVE) ×4
GLOVE ECLIPSE 7.0 STRL STRAW (GLOVE) ×6 IMPLANT
GLOVE EXAM NITRILE LRG STRL (GLOVE) IMPLANT
GLOVE EXAM NITRILE XL STR (GLOVE) IMPLANT
GLOVE EXAM NITRILE XS STR PU (GLOVE) IMPLANT
GOWN STRL REUS W/ TWL LRG LVL3 (GOWN DISPOSABLE) ×2 IMPLANT
GOWN STRL REUS W/ TWL XL LVL3 (GOWN DISPOSABLE) ×2 IMPLANT
GOWN STRL REUS W/TWL 2XL LVL3 (GOWN DISPOSABLE) ×4 IMPLANT
GOWN STRL REUS W/TWL LRG LVL3 (GOWN DISPOSABLE) ×2
GOWN STRL REUS W/TWL XL LVL3 (GOWN DISPOSABLE) ×2
GRAFT BN 5X1XSPNE CVD POST DBM (Bone Implant) ×1 IMPLANT
GRAFT BONE MAGNIFUSE 1X5CM (Bone Implant) ×1 IMPLANT
HEMOSTAT POWDER KIT SURGIFOAM (HEMOSTASIS) ×4 IMPLANT
KIT BASIN OR (CUSTOM PROCEDURE TRAY) ×2 IMPLANT
KIT INFUSE MEDIUM (Orthopedic Implant) ×2 IMPLANT
KIT ROOM TURNOVER OR (KITS) ×2 IMPLANT
MILL MEDIUM DISP (BLADE) ×2 IMPLANT
NEEDLE HYPO 18GX1.5 BLUNT FILL (NEEDLE) IMPLANT
NEEDLE HYPO 21X1.5 SAFETY (NEEDLE) ×2 IMPLANT
NEEDLE HYPO 25X1 1.5 SAFETY (NEEDLE) ×2 IMPLANT
NEEDLE SPNL 18GX3.5 QUINCKE PK (NEEDLE) ×2 IMPLANT
NS IRRIG 1000ML POUR BTL (IV SOLUTION) ×2 IMPLANT
PACK LAMINECTOMY NEURO (CUSTOM PROCEDURE TRAY) ×2 IMPLANT
PAD ARMBOARD 7.5X6 YLW CONV (MISCELLANEOUS) ×2 IMPLANT
ROD SOLERA 60MM (Rod) ×2 IMPLANT
ROD SOLERA 70MM (Rod) ×1 IMPLANT
ROD SOLERA 70X4.75X (Rod) ×1 IMPLANT
SCREW 5.5X35MM (Screw) ×2 IMPLANT
SCREW ATS 7.5X45MM (Screw) ×4 IMPLANT
SCREW BN 35X5.5XMA NS SPNE (Screw) ×2 IMPLANT
SCREW SET SOLERA (Screw) ×6 IMPLANT
SCREW SET SOLERA TI (Screw) ×6 IMPLANT
SCREW SOLERA 45X6.5XMA NS SPN (Screw) ×2 IMPLANT
SCREW SOLERA 50X6.5XMA NS SPNE (Screw) ×1 IMPLANT
SCREW SOLERA 6.5X45MM (Screw) ×2 IMPLANT
SCREW SOLERA 6.5X50MM (Screw) ×1 IMPLANT
SPACER SPNL XLORDOTIC 23X8X (Cage) ×4 IMPLANT
SPCR SPNL XLORDOTIC 23X8X (Cage) ×4 IMPLANT
SPONGE LAP 4X18 X RAY DECT (DISPOSABLE) IMPLANT
SPONGE SURGIFOAM ABS GEL 100 (HEMOSTASIS) ×2 IMPLANT
STRIP CLOSURE SKIN 1/2X4 (GAUZE/BANDAGES/DRESSINGS) IMPLANT
SUT VIC AB 0 CT1 18XCR BRD8 (SUTURE) ×3 IMPLANT
SUT VIC AB 0 CT1 8-18 (SUTURE) ×3
SUT VICRYL 3-0 RB1 18 ABS (SUTURE) ×4 IMPLANT
SYR 20CC LL (SYRINGE) ×2 IMPLANT
SYR 3ML LL SCALE MARK (SYRINGE) ×8 IMPLANT
TOWEL OR 17X24 6PK STRL BLUE (TOWEL DISPOSABLE) ×2 IMPLANT
TOWEL OR 17X26 10 PK STRL BLUE (TOWEL DISPOSABLE) ×2 IMPLANT
TRAP SPECIMEN MUCOUS 40CC (MISCELLANEOUS) ×2 IMPLANT
TRAY FOLEY W/METER SILVER 16FR (SET/KITS/TRAYS/PACK) ×2 IMPLANT
WATER STERILE IRR 1000ML POUR (IV SOLUTION) ×2 IMPLANT

## 2015-11-11 NOTE — H&P (Signed)
CC:  Back and right leg pain  HPI: [name] is a 60 year old woman I'm seeing for the above complaints. She has worsening of right-sided back and leg pain, although she has a long history of chronic neck and back pain. She initially underwent surgery in [place] back in 2007. It sounds like she needed a revision surgery after that. Unfortunately, she continued to have back pain, and ultimately underwent placement of a spinal cord stimulator in 2009, which has precluded MRI scans after that. She is managed in a pain management clinic since then, including here [place] when she moved. on June 30, she says she had significant exacerbation of her underlying chronic back pain. She began to have much more severe right-sided back pain. Over the course of about 4 or 5 days ago progressively worse to the point where she had a hard time even getting out of bed. On 4 July, she noted pain began to radiate through the right buttocks and hip, down the back of her thigh, and into the side of her calf and the top of her foot. At the same time, she noted that she was having a hard time moving her toes or picking up her foot. She has not had any new left leg symptoms. She has not noted any changes in bowel or bladder function. She says that she has to sleep sitting up in a recliner because laying down flat is very painful. She was initially placed on prednisone which did help somewhat. She is on chronic pain medication including icing, Norco, Lyrica, muscle accident, as well as Cymbalta. She is using a Rollator walker right now.   PMH: Past Medical History:  Diagnosis Date  . Allergy   . Anemia   . Anxiety   . Arthritis   . Asthma   . Carpal tunnel syndrome    bilateral  . Complication of anesthesia    in the past has had N/V, the last few surgeries she has not been  . COPD (chronic obstructive pulmonary disease) ([place])   . GERD (gastroesophageal reflux disease)   . Headache   . Heart murmur 2005    never had any problems  . Hypertension   . Hypoglycemia   . Hypoglycemia   . Kidney stones   . Neuromuscular disorder ([place])    pt denies  . Neuropathy ([facility])    both arms  . Pneumonia   . Seizures ([place])    as a child when she would have an asthma attack. none as an adult  . Shortness of breath dyspnea     PSH: Past Surgical History:  Procedure Laterality Date  . ABDOMINAL HYSTERECTOMY  2005  . [place]  . disk repair     neck and lumbar region  . FOOT SURGERY Bilateral    to file bones down   . IMPLANTATION / PLACEMENT EPIDURAL NEUROSTIMULATOR ELECTRODES    . TONSILLECTOMY      SH: Social History  Substance Use Topics  . Smoking status: Current Every Day Smoker    Packs/day: 0.50    Types: Cigarettes  . Smokeless tobacco: Never Used     Comment: trying to quit  . Alcohol use No    MEDS: Prior to Admission medications   Medication Sig Start Date End Date Taking? Authorizing Provider  albuterol (PROVENTIL HFA;VENTOLIN HFA) 108 (90 Base) MCG/ACT inhaler Inhale 2 puffs into the lungs every 6 (six) hours as needed for wheezing or shortness of  breath. 07/29/15  Yes Timmothy Euler, MD  amLODipine (NORVASC) 10 MG tablet Take 1 tablet (10 mg total) by mouth daily. 09/02/15  Yes Timmothy Euler, MD  Aspirin-Salicylamide-Caffeine (BC HEADACHE POWDER PO) Take 1 Package by mouth daily as needed (headaches).    Yes Historical Provider, MD  budesonide-formoterol (SYMBICORT) 160-4.5 MCG/ACT inhaler Inhale 2 puffs into the lungs 2 (two) times daily. 09/02/15  Yes Timmothy Euler, MD  DULoxetine (CYMBALTA) 30 MG capsule Take 3 capsules (90 mg total) by mouth daily. Patient taking differently: Take 30 mg by mouth 3 (three) times daily.  07/29/15  Yes Timmothy Euler, MD  HYDROcodone-acetaminophen (NORCO) 10-325 MG tablet Take 1 tablet by mouth every 6 (six) hours as needed for moderate pain.  07/28/15  Yes Historical Provider, MD  HYSINGLA ER 60 MG T24A Take 1 tablet  by mouth daily.  07/07/15  Yes Historical Provider, MD  pantoprazole (PROTONIX) 40 MG tablet Take 1 tablet (40 mg total) by mouth daily. 07/29/15  Yes Timmothy Euler, MD  tiZANidine (ZANAFLEX) 4 MG tablet Take 6 mg by mouth 3 (three) times daily.  07/26/15  Yes Historical Provider, MD  traZODone (DESYREL) 100 MG tablet Take 0.5-1 tablets (50-100 mg total) by mouth at bedtime. Patient taking differently: Take 100 mg by mouth at bedtime.  02/22/15  Yes Timmothy Euler, MD  albuterol (PROVENTIL) (2.5 MG/3ML) 0.083% nebulizer solution Take 3 mLs (2.5 mg total) by nebulization every 6 (six) hours as needed for wheezing or shortness of breath. 07/29/15   Timmothy Euler, MD  cetirizine (ZYRTEC) 10 MG tablet Take 10 mg by mouth daily as needed for allergies. Reported on 03/12/2015    Historical Provider, MD    ALLERGY: Allergies  Allergen Reactions  . Darvon [Propoxyphene] Other (See Comments)    UNSPECIFIED REACTION   . Erythromycin Other (See Comments)    UNSPECIFIED REACTION   . Vibramycin [Doxycycline Calcium] Other (See Comments)    UNSPECIFIED REACTION   . Augmentin [Amoxicillin-Pot Clavulanate] Nausea And Vomiting    ROS: ROS  NEUROLOGIC EXAM: Awake, alert, oriented Memory and concentration grossly intact Speech fluent, appropriate CN grossly intact Motor exam: Upper Extremities Deltoid Bicep Tricep Grip  Right 5/5 5/5 5/5 5/5  Left 5/5 5/5 5/5 5/5   Lower Extremity IP Quad PF DF EHL  Right 5/5 5/5 5/5 1/5 1/5  Left 5/5 5/5 5/5 5/5 5/5   Sensation grossly intact to LT  [facility]: CT myelogram of the lumbar spine dated 10/28/15 was reviewed. This demonstrates prior surgery at L3 L4 where there is interbody likely TLIF device, without evidence of interbody fusion. There are also unilateral pedicle screws at L3 L4, with slight lucency of the L4 screw. There does not appear to be any posterolateral fusion. At L4 L5, there is some disc desiccation, with vacuum disc phenomenon. There  is broad-based disc bulge, slightly worse on the right, with associated ligamentous hypertrophy, and resultant severe lateral recess and foraminal stenosis, worse on the right.  IMPRESSION: 60 year old woman with chronic back pain, and right leg pain, with subacute right foot drop. This is likely related to stenosis on the right at L4 L5. She does also have pseudoarthrosis, with lucency around the right L4 screw from her previous L3 L4 fusion. With her foot drop, she obviously needs a decompression, however in order to fully decompress the L4 and L5 nerve roots, she will require complete facetectomy. Adjacent to a previous instrumented level, which is now pseudoarthrosis,  I think she needs interbody fusion at L4 L5, revision of the previous 3 for hardware, and extension of posterolateral fusion from L3 to L5.  PLAN: - We will plan on proceeding with removal of the L3 L4 hardware, interbody fusion at L4 L5, and posterolateral fusion with revision of hardware from L3 to L5.   I did review the CT myelogram findings with the patient and her husband. Treatment options were discussed at this point, including a recommendation for decompression and fusion given her foot drop, and the pseudoarthrosis. Risks of surgery were discussed in detail. The patient is fully aware of the risks given her prior surgeries. I explained to the patient the details of the procedure, as well as the risks which include but are not limited to nerve root injury leading to leg or foot weakness, numbness, paralysis, and or bowel and bladder dysfunction, CSF leak, bleeding, and infection. Possible outcomes of surgery were also discussed including the possibility of persistence of pain symptoms and the possiblity of accelerated adjacent level degeneration. The general risks of anesthesia were also reviewed including heart attack, stroke, and DVT/PE.   The patient understood our discussion and is willing to proceed with surgical  decompression and fusion. All her questions were answered.

## 2015-11-11 NOTE — Anesthesia Postprocedure Evaluation (Signed)
Anesthesia Post Note  Patient: Florene Brill. Mealing  Procedure(s) Performed: Procedure(s) (LRB): POSTERIOR LUMBAR INTERBODY FUSION LUMBAR FOUR-LUMBAR FIVE , , REVISION OF HARDWARE LUMBAR THREE-LUMBAR FOUR ,POSTEROLATERAL FUSION LUMBAR THREE-LUMBAR FIVE (N/A)  Patient location during evaluation: PACU Anesthesia Type: General Level of consciousness: sedated Pain management: pain level controlled Vital Signs Assessment: post-procedure vital signs reviewed and stable Respiratory status: spontaneous breathing and respiratory function stable Cardiovascular status: stable Anesthetic complications: no    Last Vitals:  Vitals:   11/11/15 1925 11/11/15 1940  BP: 139/78 (!) 154/82  Pulse: 89 86  Resp: 11 10  Temp:  36.5 C    Last Pain:  Vitals:   11/11/15 1940  TempSrc:   PainSc: 3                  Edwyna Dangerfield DANIEL

## 2015-11-11 NOTE — Op Note (Signed)
PREOP DIAGNOSIS:  1. Right foot drop 2. Lumbar stenosis, L4-5 3. Lumbago with radiculopathy 4. Pseudoarthrosis  POSTOP DIAGNOSIS: Same  PROCEDURE: 1. L4-5 laminectomy with facetectomy for decompression of exiting nerve roots, more than would be required for placement of interbody graft 2. Placement of anterior interbody device - 734m expandable Medtronic cage x2 3. Removal of previous pedicle screws at L3, L4 4. Posterior instrumentation using cortical pedicle screws at L5, standard pedicle screws at L3, L4 Medtronic Solera 5. Interbody arthrodesis, L4-5 6. Use of locally harvested bone autograft 7. Use of non-structural bone allograft - BMP, Medtronic Magnifuse 5cm bag x2 8. Posterolateral arthrodesis, L3-4, L4-5  SURGEON: Dr. NConsuella Lose MD  ASSISTANT: Dr. DSherley Bounds MD  ANESTHESIA: General Endotracheal  EBL: 450cc  SPECIMENS: None  DRAINS: None  COMPLICATIONS: None immediate  CONDITION: Hemodynamically stable to PACU  HISTORY: Jasmine [name] a 60y.o. female who has been followed in the outpatient clinic with back and primarily right leg pain as well as subacute right foot drop, related to spondylosis with stenosis at L4-5 and pseudoarthrosis at L3-4 from previous TLIF. She attempted multiple conservative treatments and ultimately elected to proceed with surgical decompression and fusion. Risks and benefits were reviewed and consent was obtained.  PROCEDURE IN DETAIL: After informed consent was obtained and witnessed, the patient was brought to the operating room. After induction of general anesthesia, the patient was positioned on the operative table in the prone position. All pressure points were meticulously padded. Incision was then marked out and prepped and draped in the usual sterile fashion.  After timeout was conducted, skin was infiltrated with local anesthetic. Skin incision was then made sharply and Bovie electrocautery was used to dissect the  subcutaneous tissue until the lumbodorsal fascia was identified and incised. The muscle was then elevated in the subperiosteal plane and the L3, L4, L5 lamina as well as the L2-3, L3-4, L4-5  facet complexes were identified. The lateral facet complex and transverse process at L3, L4, and L5 was dissected. Self-retaining retractors were then placed.  The previous left L3 laminectomy defect was identified, and the screws at L3-4 were dissected out. These screws were then removed and probed. The screw paths appeared to be good, without identifiable pedicle breach.  At this point attention was turned to decompression. Complete L4-5 laminectomy with bilateral facetectomy was completed using a combination of Kerrison rongeurs and a high-speed drill. Bone was harvested for arthrodesis. Good decompression of the exiting and traversing roots was confirmed.  Disc space at L4-5 was then identified, incised, and using a combination of shavers, curettes and rongeurs, complete discectomy was completed. Endplates were prepared, and a expandable 814mcage was tapped into place bilaterally under fluoro.  Bone harvested during decompression was mixed with BMP and packed into the interspace.   At this point, the L5 cortical pedicle screws were drilled and tapped to 5.5 mm. Screws were then placed. Similarly, standard pedicle screw trajectory was drill on the left at L3 and L4. 6.34m20mcrews were placed on the left, and 7.5 screws in the previous right L3 and L4 holes. Rod was then placed, set screws placed and final tightened. Final AP and lateral fluoroscopic images confirmed good position.  The lateral facet complex an transverse process at L3, L4, and L5 was decorticated with the drill. The remainder of the autograft along with BMP and Magnifuse was then placed across the intertransverse space for posterolateral arthrodesis.  The wound was then irrigated with copious  amounts of antibiotic saline, then closed in standard  fashion using a combination of interrupted 0 and 3-0 Vicryl stitches in the muscular, fascial, and subcutaneous layers. Skin was then closed using standard Dermabond. Sterile dressing was then applied. The patient was then transferred to the stretcher, extubated, and taken to the postanesthesia care unit in stable hemodynamic condition.  At the end of the case all sponge, needle, cottonoid, and instrument counts were correct.

## 2015-11-11 NOTE — Progress Notes (Signed)
Patient arrived to floor via PACU staff, husband at bedside with belongings. Vitals stable, patient oriented to unit, questions answered. Home medication obtained, counted, to be taken to pharmacy. Pain medication given, pain rated 6/10.  Will continue to monitor.

## 2015-11-11 NOTE — Progress Notes (Signed)
Pharmacy Antibiotic Note Jasmine [name] is a 60 y.o. female admitted on 11/11/2015 for laminectomy.  Pharmacy has been consulted for vancomycin dosing for surgical prophylaxis post op. No drain in place.    Plan: 1. Vancomycin 1 gram x 1 on 9/8 at 0200 2. Will sign off; please re consult if further assistance needed   Height: '5\' 3"'$  (160 cm) Weight: 122 lb (55.3 kg) IBW/kg (Calculated) : 52.4  Temp (24hrs), Avg:98.2 F (36.8 C), Min:97.7 F (36.5 C), Max:98.9 F (37.2 C)   Recent Labs Lab 11/11/15 0938  WBC 8.3  CREATININE 0.50    Estimated Creatinine Clearance: 60.3 mL/min (by C-G formula based on SCr of 0.8 mg/dL).    Allergies  Allergen Reactions  . Darvon [Propoxyphene] Other (See Comments)    UNSPECIFIED REACTION   . Erythromycin Other (See Comments)    UNSPECIFIED REACTION   . Vibramycin [Doxycycline Calcium] Other (See Comments)    UNSPECIFIED REACTION   . Augmentin [Amoxicillin-Pot Clavulanate] Nausea And Vomiting    Antimicrobials this admission: 9//7 vancomycin > 9/8   Thank you for allowing pharmacy to be a part of this patient's care.  Duayne Cal 11/11/2015 8:07 PM

## 2015-11-11 NOTE — Anesthesia Procedure Notes (Signed)
Procedure Name: Intubation Date/Time: 11/11/2015 2:28 PM Performed by: Rebekah Chesterfield L Pre-anesthesia Checklist: Patient identified, Emergency Drugs available, Suction available and Patient being monitored Patient Re-evaluated:Patient Re-evaluated prior to inductionOxygen Delivery Method: Circle System Utilized Preoxygenation: Pre-oxygenation with 100% oxygen Intubation Type: IV induction Ventilation: Mask ventilation without difficulty Laryngoscope Size: Mac and 3 Grade View: Grade III Tube type: Oral Tube size: 7.0 mm Number of attempts: 1 Airway Equipment and Method: Stylet and Oral airway Placement Confirmation: ETT inserted through vocal cords under direct vision,  positive ETCO2 and breath sounds checked- equal and bilateral Secured at: 20 cm Tube secured with: Tape Dental Injury: Teeth and Oropharynx as per pre-operative assessment

## 2015-11-11 NOTE — Anesthesia Preprocedure Evaluation (Signed)
Anesthesia Evaluation  Patient identified by MRN, date of birth, ID band Patient awake    Reviewed: Allergy & Precautions, NPO status , Patient's Chart, lab work & pertinent test results  History of Anesthesia Complications (+) PONV and history of anesthetic complications  Airway Mallampati: II  TM Distance: >3 FB Neck ROM: Full    Dental  (+) Teeth Intact   Pulmonary shortness of breath, asthma , COPD,  COPD inhaler, Current Smoker,    breath sounds clear to auscultation       Cardiovascular hypertension, Pt. on medications  Rhythm:Regular     Neuro/Psych  Headaches, Seizures -,  Anxiety  Neuromuscular disease    GI/Hepatic Neg liver ROS, GERD  ,  Endo/Other    Renal/GU      Musculoskeletal  (+) Arthritis ,   Abdominal   Peds  Hematology   Anesthesia Other Findings   Reproductive/Obstetrics                             Anesthesia Physical Anesthesia Plan  ASA: III  Anesthesia Plan: General   Post-op Pain Management:    Induction: Intravenous  Airway Management Planned: Oral ETT  Additional Equipment: None  Intra-op Plan:   Post-operative Plan: Extubation in OR  Informed Consent: I have reviewed the patients History and Physical, chart, labs and discussed the procedure including the risks, benefits and alternatives for the proposed anesthesia with the patient or authorized representative who has indicated his/her understanding and acceptance.   Dental advisory given  Plan Discussed with: CRNA and Surgeon  Anesthesia Plan Comments:         Anesthesia Quick Evaluation

## 2015-11-11 NOTE — Transfer of Care (Signed)
Immediate Anesthesia Transfer of Care Note  Patient: Jasmine [name]. [place]  Procedure(s) Performed: Procedure(s): POSTERIOR LUMBAR INTERBODY FUSION LUMBAR FOUR-LUMBAR FIVE , , REVISION OF HARDWARE LUMBAR THREE-LUMBAR FOUR ,POSTEROLATERAL FUSION LUMBAR THREE-LUMBAR FIVE (N/A)  Patient Location: PACU  Anesthesia Type:General  Level of Consciousness: awake, alert  and oriented  Airway & Oxygen Therapy: Patient Spontanous Breathing and Patient connected to nasal cannula oxygen  Post-op Assessment: Report given to RN and Post -op Vital signs reviewed and stable  Post vital signs: Reviewed and stable  Last Vitals:  Vitals:   11/11/15 0937 11/11/15 1840  BP: (!) 170/62 (!) 157/82  Pulse: 94 100  Resp: 20 18  Temp: 37.2 C     Last Pain:  Vitals:   11/11/15 0939  TempSrc:   PainSc: 8       Patients Stated Pain Goal: 3 (59/97/74 1423)  Complications: No apparent anesthesia complications

## 2015-11-12 LAB — BASIC METABOLIC PANEL
ANION GAP: 8 (ref 5–15)
BUN: 6 mg/dL (ref 6–20)
CO2: 25 mmol/L (ref 22–32)
Calcium: 8.9 mg/dL (ref 8.9–10.3)
Chloride: 106 mmol/L (ref 101–111)
Creatinine, Ser: 0.52 mg/dL (ref 0.44–1.00)
GFR calc Af Amer: >60 mL/min (ref >60)
Glucose, Bld: 97 mg/dL (ref 65–99)
POTASSIUM: 3.5 mmol/L (ref 3.5–5.1)
SODIUM: 139 mmol/L (ref 135–145)

## 2015-11-12 LAB — CBC
HCT: 31.6 % — ABNORMAL LOW (ref 36.0–46.0)
Hemoglobin: 9.7 g/dL — ABNORMAL LOW (ref 12.0–15.0)
MCH: 28.5 pg (ref 26.0–34.0)
MCHC: 30.7 g/dL (ref 30.0–36.0)
MCV: 92.9 fL (ref 78.0–100.0)
PLATELETS: 227 10*3/uL (ref 150–400)
RBC: 3.4 MIL/uL — AB (ref 3.87–5.11)
RDW: 16 % — ABNORMAL HIGH (ref 11.5–15.5)
WBC: 9.8 10*3/uL (ref 4.0–10.5)

## 2015-11-12 NOTE — Progress Notes (Signed)
OT NOTE  Pt currently with order for back brace and no brace present. RN aware of therapy holding pending brace arrival and [employer] notified of brace needed for patient.   OT will provided evaluation at the next most appropriate time.   Jeri Modena   OTR/L Pager: [phone] Office: [phone] .

## 2015-11-12 NOTE — Progress Notes (Signed)
RN removed foley per order. Patient up to [facility] with 1 assist to void successfully (small amount).  Aspen lumbar brace from home in room at bedside.  Will continue to monitor patient.

## 2015-11-12 NOTE — Evaluation (Signed)
Physical Therapy Evaluation Patient Details Name: Jasmine [name]. Jasmine [name] MRN: [id number] DOB: 06-23-1955 Today's Date: 11/12/2015   History of Present Illness  60 yo female s/p L4-5 revision of hardware L 3-4 fusion. Pt with pain in R buttocks and hip since September 07 2015. Pt sleeps in recliner due to pain with supine positioning pmh:  Clinical Impression  Pt admitted with/for lumbar fusion surgery.  Pt currently limited functionally due to the problems listed below.  (see problems list.)  Pt will benefit from PT to maximize function and safety to be able to get home safely with available assist of family.     Follow Up Recommendations No PT follow up;Supervision for mobility/OOB    Equipment Recommendations  None recommended by PT    Recommendations for Other Services       Precautions / Restrictions Precautions Precautions: Back Precaution Comments: back handout provided- will need further review next session. pt states "when i have pain you can't tell me this stuff I wont remember it" Required Braces or Orthoses: Spinal Brace Spinal Brace: Lumbar corset;Applied in sitting position Restrictions Weight Bearing Restrictions: No      Mobility  Bed Mobility Overal bed mobility: Needs Assistance Bed Mobility: Sidelying to Sit;Sit to Sidelying   Sidelying to sit: Min guard   Sit to supine: Min guard Sit to sidelying: Min guard General bed mobility comments: able to complete side lying and lift bil Le onto bed surface without assist  Transfers Overall transfer level: Needs assistance Equipment used: 4-wheeled walker Transfers: Sit to/from Stand Sit to Stand: Min guard         General transfer comment: not assist needed, cues for hand placement  Ambulation/Gait Ambulation/Gait assistance: Min guard Ambulation Distance (Feet): 15 Feet (then additional 150 feet with rollator) Assistive device: 4-wheeled walker Gait Pattern/deviations: Step-through pattern Gait velocity:  slower Gait velocity interpretation: Below normal speed for age/gender General Gait Details: steady with steppage gait on the R due to foot drop.  Stairs            Wheelchair Mobility    Modified Rankin (Stroke Patients Only)       Balance Overall balance assessment: No apparent balance deficits (not formally assessed)                                           Pertinent Vitals/Pain Pain Assessment: 0-10 Pain Score: 7  Faces Pain Scale: Hurts worst Pain Location: back Pain Descriptors / Indicators: Operative site guarding;Aching Pain Intervention(s): Monitored during session;Repositioned    Home Living Family/patient expects to be discharged to:: Private residence Living Arrangements: Spouse/significant other;Other relatives Available Help at Discharge: Family;Available 24 hours/day Type of Home: House Home Access: Ramped entrance     Home Layout: One level Home Equipment: Walker - 4 wheels;Wheelchair - manual;Shower seat - built in;Grab bars - tub/shower Additional Comments: pt with progressive R LE foot drop that affects walking per patien    Prior Function Level of Independence: Independent with assistive device(s)         Comments: Pt reports minimal ambulation due to pain. pt reports spouse pushes in w/c for doctor appointments and community transfer. pt sleeps in recliner at home     Hand Dominance   Dominant Hand: Right    Extremity/Trunk Assessment   Upper Extremity Assessment: Defer to OT evaluation  Lower Extremity Assessment: Overall WFL for tasks assessed;RLE deficits/detail RLE Deficits / Details: foot drop-- ACE wrapping did not fully help the problem.    Cervical / Trunk Assessment: Other exceptions  Communication   Communication: No difficulties  Cognition Arousal/Alertness: Awake/alert Behavior During Therapy: WFL for tasks assessed/performed Overall Cognitive Status: Within Functional Limits for  tasks assessed                      General Comments General comments (skin integrity, edema, etc.): pt instructed in back care/prec, log roll/transitions, lifting restrictions, bracing issues, progression of activity.    Exercises        Assessment/Plan    PT Assessment Patient needs continued PT services  PT Diagnosis Acute pain;Abnormality of gait   PT Problem List Decreased strength;Decreased activity tolerance;Decreased mobility;Decreased knowledge of use of DME;Decreased knowledge of precautions;Pain  PT Treatment Interventions DME instruction;Gait training;Functional mobility training;Therapeutic activities;Patient/family education   PT Goals (Current goals can be found in the Care Plan section) Acute Rehab PT Goals Patient Stated Goal: to be able to use my foot PT Goal Formulation: With patient Time For Goal Achievement: 11/19/15 Potential to Achieve Goals: Good    Frequency Min 5X/week   Barriers to discharge        Co-evaluation               End of Session   Activity Tolerance: Patient tolerated treatment well;No increased pain Patient left: in bed;with call bell/phone within reach;with family/visitor present Nurse Communication: Mobility status         Time: 1424-1500 PT Time Calculation (min) (ACUTE ONLY): 36 min   Charges:   PT Evaluation $PT Eval Low Complexity: 1 Procedure PT Treatments $Gait Training: 8-22 mins   PT G Codes:        Jasmine [name], [name] 11/12/2015, 3:15 PM 11/12/2015  [name], PT [phone]  (pager)

## 2015-11-12 NOTE — Evaluation (Signed)
Occupational Therapy Evaluation Patient Details Name: Jasmine [name]. Jasmine [name] MRN: [id number] DOB: March 27, 1955 Today's Date: 11/12/2015    History of Present Illness 60 yo female s/p L4-5 revision of hardware L 3-4 fusion. Pt with pain in R buttocks and hip since September 07 2015. Pt sleeps in recliner due to pain with supine positioning pmh:  Past Medical History:  Diagnosis Date  . Allergy   . Anemia   . Anxiety   . Arthritis   . Asthma   . Carpal tunnel syndrome    bilateral  . Complication of anesthesia    in the past has had N/V, the last few surgeries she has not been  . COPD (chronic obstructive pulmonary disease) ([place])   . GERD (gastroesophageal reflux disease)   . Headache   . Heart murmur 2005   never had any problems  . Hypertension   . Hypoglycemia   . Hypoglycemia   . Kidney stones   . Neuromuscular disorder ([place])    pt denies  . Neuropathy (HCC)    both arms  . Pneumonia   . Seizures ([place])    as a child when she would have an asthma attack. none as an adult  . Shortness of breath dyspnea       Clinical Impression   Patient is s/p L3-4-5 fusion surgery resulting in functional limitations due to the deficits listed below (see OT problem list). PTA spouse assisting due to R drop foot. Patient will benefit from skilled OT acutely to increase independence and safety with ADLS to allow discharge outpatient.     Follow Up Recommendations  Outpatient OT (R LE foot drop)    Equipment Recommendations  None recommended by OT    Recommendations for Other Services       Precautions / Restrictions Precautions Precautions: Back Precaution Comments: back handout provided- will need further review next session. pt states "when i have pain you can't tell me this stuff I wont remember it" Required Braces or Orthoses: Spinal Brace Spinal Brace: Lumbar corset;Applied in sitting position      Mobility Bed Mobility Overal bed mobility: Needs Assistance Bed Mobility: Sit to  Supine       Sit to supine: Min guard   General bed mobility comments: able to complete side lying and lift bil Le onto bed surface  Transfers Overall transfer level: Needs assistance Equipment used: 4-wheeled walker Transfers: Sit to/from Stand Sit to Stand: Min assist         General transfer comment: (A) provided to power up into standing    Balance                                            ADL Overall ADL's : Needs assistance/impaired Eating/Feeding: Set up;Sitting   Grooming: Wash/dry hands;Min guard;Standing Grooming Details (indicate cue type and reason): leaning on sink surface Upper Body Bathing: Min guard;Sitting   Lower Body Bathing: Minimal assistance;Sit to/from stand Lower Body Bathing Details (indicate cue type and reason): required standing for peri care Upper Body Dressing : Min guard;Sitting Upper Body Dressing Details (indicate cue type and reason): able to doff brace at [facility]     Toilet Transfer: Min guard;Ambulation;BSC;RW   Toileting- Water quality scientist and Hygiene: Min guard;Sit to/from stand Toileting - Clothing Manipulation Details (indicate cue type and reason): peri care static standing     Functional mobility  during ADLs: Min guard General ADL Comments: pt educated on back precautions but very limited recall. pt very short and direct in responses due to pain.  Back handout provided and reviewed adls in detail. Pt educated on: clothing between brace, never sleep in brace,avoid sitting for long periods of time, , correct sequence for bed mobility, avoiding lifting more than 5 pounds and never wash directly over incision.       Vision     Perception     Praxis      Pertinent Vitals/Pain Pain Assessment: Faces Pain Score: 10-Worst pain ever Faces Pain Scale: Hurts worst Pain Location: back Pain Descriptors / Indicators: Operative site guarding Pain Intervention(s): Limited activity within patient's  tolerance;Monitored during session;Premedicated before session;Repositioned     Hand Dominance Right   Extremity/Trunk Assessment Upper Extremity Assessment Upper Extremity Assessment: Overall WFL for tasks assessed   Lower Extremity Assessment Lower Extremity Assessment: Defer to PT evaluation;RLE deficits/detail RLE Deficits / Details: foot drop recommend toe lifter or ace wrap to prevent R LE from dragging   Cervical / Trunk Assessment Cervical / Trunk Assessment: Other exceptions Cervical / Trunk Exceptions: s/p surg   Communication Communication Communication: No difficulties   Cognition Arousal/Alertness: Awake/alert Behavior During Therapy: Impulsive Overall Cognitive Status: Within Functional Limits for tasks assessed                     General Comments       Exercises       Shoulder Instructions      Home Living Family/patient expects to be discharged to:: Private residence Living Arrangements: Spouse/significant other;Other relatives Available Help at Discharge: Family;Available 24 hours/day Type of Home: House Home Access: Ramped entrance     Home Layout: One level     Bathroom Shower/Tub: Tub/shower unit;Walk-in shower   Bathroom Toilet: Standard     Home Equipment: Environmental consultant - 4 wheels;Wheelchair - manual;Shower seat - built in;Grab bars - tub/shower   Additional Comments: pt with progressive R LE foot drop that affects walking per patien      Prior Functioning/Environment Level of Independence: Independent with assistive device(s)        Comments: Pt reports minimal ambulation due to pain. pt reports spouse pushes in w/c for doctor appointments and community transfer. pt sleeps in recliner at home    OT Diagnosis: Generalized weakness;Acute pain   OT Problem List: Decreased strength;Decreased activity tolerance;Impaired balance (sitting and/or standing);Decreased safety awareness;Decreased knowledge of use of DME or AE;Decreased  knowledge of precautions;Pain   OT Treatment/Interventions: Self-care/ADL training;Therapeutic exercise;DME and/or AE instruction;Therapeutic activities;Patient/family education;Balance training    OT Goals(Current goals can be found in the care plan section) Acute Rehab OT Goals Patient Stated Goal: to be able to use my foot OT Goal Formulation: With patient/family Time For Goal Achievement: 11/26/15 Potential to Achieve Goals: Good ADL Goals Pt Will Perform Lower Body Bathing: with supervision;sit to/from stand;with adaptive equipment Pt Will Transfer to Toilet: with modified independence;bedside commode;ambulating Pt Will Perform Tub/Shower Transfer: Shower transfer;with min guard assist;shower seat;ambulating;rolling walker Additional ADL Goal #1: pt will verbalize 3 out 3 back precautions Additional ADL Goal #2: pt will complete basic transfer sit to stand mod i to demonstrate ability to exit recliner at home without fall risk  OT Frequency: Min 2X/week   Barriers to D/C:            Co-evaluation              End of Session  Equipment Utilized During Treatment: Gait belt;Rolling walker;Back brace Nurse Communication: Mobility status;Precautions  Activity Tolerance: Patient limited by pain Patient left: in bed;with call bell/phone within reach;with bed alarm set;with family/visitor present;with SCD's reapplied   Time: 1120-1150 OT Time Calculation (min): 30 min Charges:  OT General Charges $OT Visit: 1 Procedure OT Evaluation $OT Eval Moderate Complexity: 1 Procedure G-Codes:    Peri Maris December 10, 2015, 2:19 PM  Jeri Modena   OTR/L Pager: [phone] Office: [phone] .

## 2015-11-12 NOTE — Progress Notes (Signed)
No issues overnight. Pt reports primarily back pain. Has had improvement in right leg pain.   EXAM:  BP (!) 113/58 (BP Location: Left Arm)   Pulse 95   Temp 99.3 F (37.4 C) (Oral)   Resp 16   Ht '5\' 3"'$  (1.6 m)   Wt 55.3 kg (122 lb)   SpO2 100%   BMI 21.61 kg/m   Awake, alert, oriented  Speech fluent, appropriate  CN grossly intact  5/5 BUE/BLE x 0-1/5 right DF/EHL  IMPRESSION:  60 y.o. female POD#1 L4-5 PLIF, revision L3-4 hardware, posterolateral fusion L3-5. Doing well  PLAN: - Cont to mobilize

## 2015-11-12 NOTE — Addendum Note (Signed)
Addendum  created 11/12/15 1454 by Eligha Bridegroom, CRNA   Anesthesia Event edited

## 2015-11-13 ENCOUNTER — Inpatient Hospital Stay (HOSPITAL_COMMUNITY)

## 2015-11-13 DIAGNOSIS — I10 Essential (primary) hypertension: Secondary | ICD-10-CM

## 2015-11-13 LAB — CK TOTAL AND CKMB (NOT AT ARMC)
CK TOTAL: 308 U/L — AB (ref 38–234)
CK, MB: 1.5 ng/mL (ref 0.5–5.0)
RELATIVE INDEX: 0.5 (ref 0.0–2.5)

## 2015-11-13 LAB — CBC
HCT: 26.4 % — ABNORMAL LOW (ref 36.0–46.0)
Hemoglobin: 7.8 g/dL — ABNORMAL LOW (ref 12.0–15.0)
MCH: 28.3 pg (ref 26.0–34.0)
MCHC: 29.5 g/dL — ABNORMAL LOW (ref 30.0–36.0)
MCV: 95.7 fL (ref 78.0–100.0)
PLATELETS: 179 10*3/uL (ref 150–400)
RBC: 2.76 MIL/uL — AB (ref 3.87–5.11)
RDW: 16.1 % — AB (ref 11.5–15.5)
WBC: 8.4 10*3/uL (ref 4.0–10.5)

## 2015-11-13 LAB — GLUCOSE, CAPILLARY: Glucose-Capillary: 121 mg/dL — ABNORMAL HIGH (ref 65–99)

## 2015-11-13 LAB — BASIC METABOLIC PANEL
ANION GAP: 5 (ref 5–15)
BUN: <5 mg/dL — ABNORMAL LOW (ref 6–20)
CALCIUM: 8.3 mg/dL — AB (ref 8.9–10.3)
CO2: 26 mmol/L (ref 22–32)
Chloride: 107 mmol/L (ref 101–111)
Creatinine, Ser: 0.52 mg/dL (ref 0.44–1.00)
Glucose, Bld: 121 mg/dL — ABNORMAL HIGH (ref 65–99)
POTASSIUM: 3 mmol/L — AB (ref 3.5–5.1)
Sodium: 138 mmol/L (ref 135–145)

## 2015-11-13 LAB — LACTIC ACID, PLASMA: Lactic Acid, Venous: 0.7 mmol/L (ref 0.5–1.9)

## 2015-11-13 MED ORDER — SODIUM CHLORIDE 0.9 % IV BOLUS (SEPSIS)
1000.0000 mL | Freq: Once | INTRAVENOUS | Status: AC
  Administered 2015-11-13: 1000 mL via INTRAVENOUS

## 2015-11-13 MED ORDER — LIDOCAINE 5 % EX PTCH
1.0000 | MEDICATED_PATCH | CUTANEOUS | Status: DC
  Administered 2015-11-14 – 2015-11-16 (×3): 1 via TRANSDERMAL
  Filled 2015-11-13 (×3): qty 1

## 2015-11-13 MED ORDER — SODIUM CHLORIDE 0.9 % IV BOLUS (SEPSIS)
500.0000 mL | Freq: Once | INTRAVENOUS | Status: AC
  Administered 2015-11-13: 500 mL via INTRAVENOUS

## 2015-11-13 MED ORDER — ASPIRIN 325 MG PO TABS
ORAL_TABLET | ORAL | Status: AC
  Administered 2015-11-13: 325 mg via ORAL
  Filled 2015-11-13: qty 1

## 2015-11-13 MED ORDER — ASPIRIN 325 MG PO TABS
325.0000 mg | ORAL_TABLET | Freq: Every day | ORAL | Status: DC
  Administered 2015-11-13 – 2015-11-15 (×3): 325 mg via ORAL
  Filled 2015-11-13 (×3): qty 1

## 2015-11-13 MED ORDER — POTASSIUM CHLORIDE CRYS ER 20 MEQ PO TBCR
40.0000 meq | EXTENDED_RELEASE_TABLET | ORAL | Status: AC
  Administered 2015-11-13 – 2015-11-14 (×2): 40 meq via ORAL
  Filled 2015-11-13 (×2): qty 2

## 2015-11-13 NOTE — Progress Notes (Signed)
Postop day 1. Patient doing reasonably well. Reports that her back pain is manageable. Her right lower extremity pain is been neural leave. Still with complete foot drop on the right side however.  Afebrile. Vital signs are stable. Urine output good. Awake and alert. Oriented and appropriate. Motor and sensory function stable with complete right foot drop. Wound clean and dry. Chest and abdomen benign.

## 2015-11-13 NOTE — Progress Notes (Signed)
Physical Therapy Treatment Patient Details Name: Jasmine [name]. Cwynar MRN: [id number] DOB: 01-15-1956 Today's Date: 11/13/2015    History of Present Illness 60 yo female s/p L4-5 revision of hardware L 3-4 fusion. Pt with pain in R buttocks and hip since September 07 2015. Pt sleeps in recliner due to pain with supine positioning pmh:    PT Comments    Pt continues to make steady progress toward goals. Pt and spouse shown and issued information no the foot up brace and a soft AFO brace that can be used to control foot drop if pt does not recover this mobility. Pt to follow up with surgeon and get order for these if they become necessary. Patient safe to D/C from a mobility standpoint based on progression towards goals set on PT eval.    Follow Up Recommendations  No PT follow up;Supervision for mobility/OOB     Equipment Recommendations  None recommended by PT;Other (comment) (did give pt information on braces for foot drop)    Recommendations for Other Services       Precautions / Restrictions Precautions Precautions: Back Precaution Comments: reviewed all 3 precautions with pt and spouse. Required Braces or Orthoses: Spinal Brace;Other Brace/Splint Spinal Brace: Lumbar corset;Applied in sitting position Other Brace/Splint: ace wrap to right foot due to foot drop Restrictions Weight Bearing Restrictions: No    Mobility  Bed Mobility Overal bed mobility: Modified Independent   Rolling: Modified independent (Device/Increase time) Sidelying to sit: Modified independent (Device/Increase time)   Sit to supine: Modified independent (Device/Increase time)   General bed mobility comments: bed flat and no rails used.  Transfers Overall transfer level: Needs assistance Equipment used: 4-wheeled walker (rollator)   Sit to Stand: Supervision Stand pivot transfers: Supervision       General transfer comment: increased time needed with cues for anterior weight shifting to assist with  standing. cues with reaching back with both hands vs one hand to avoid twisting with sitting down to bed.  Ambulation/Gait Ambulation/Gait assistance: Min guard;Supervision Ambulation Distance (Feet): 80 Feet Assistive device: 4-wheeled walker (rollator) Gait Pattern/deviations: Step-through pattern;Decreased stride length Gait velocity: decreased Gait velocity interpretation: Below normal speed for age/gender General Gait Details: improved control of foot drop on right with ace wrap today (pt reports it feels tighter today and this may have helped). cues on hand placement on handles/brakes of rollator for increased safety with use of rollator.                                                       Cognition Arousal/Alertness: Awake/alert Behavior During Therapy: WFL for tasks assessed/performed Overall Cognitive Status: Within Functional Limits for tasks assessed            Pertinent Vitals/Pain Pain Assessment: 0-10 Pain Score: 5  Pain Location: back Pain Descriptors / Indicators: Aching;Sore;Discomfort Pain Intervention(s): Limited activity within patient's tolerance;Monitored during session;Patient requesting pain meds-RN notified;RN gave pain meds during session;Repositioned     PT Goals (current goals can now be found in the care plan section) Acute Rehab PT Goals Patient Stated Goal: to be able to use my foot PT Goal Formulation: With patient Time For Goal Achievement: 11/19/15 Potential to Achieve Goals: Good Progress towards PT goals: Progressing toward goals    Frequency  Min 5X/week    PT Plan Current plan  remains appropriate    End of Session Equipment Utilized During Treatment: Gait belt;Back brace Activity Tolerance: Patient tolerated treatment well;No increased pain Patient left: in bed;with call bell/phone within reach;with nursing/sitter in room;with family/visitor present     Time: 1226-1256 PT Time Calculation (min) (ACUTE ONLY): 30  min  Charges:  $Gait Training: 8-22 mins $Therapeutic Activity: 8-22 mins           Willow Ora 11/13/2015, 2:18 PM   Willow Ora, PTA, CLT Acute Rehab Services [phone] 11/13/15, 2:20 PM

## 2015-11-13 NOTE — Plan of Care (Signed)
Problem: Acute Rehab OT Goals (only OT should resolve) Goal: Pt. Will Perform Lower Body Bathing Outcome: Not Met (add Reason) Pt. Declined need for instruction stating family assist at home.

## 2015-11-13 NOTE — Progress Notes (Signed)
Patient ID: Jasmine [name]. Heft, female   DOB: [birth year], 60 y.o.   MRN: [id number] Called to see patient for an episode of diaphoresis and low blood pressure. Blood pressure did down the mid 60s patient so this was proceeded by headache she denies any chest pain shortness of breath nausea or vomiting. After initiation of bolus of 5 mL saline mixed blood pressure recorded was 85. Patient did get pain medication 2 hours prior and blood pressure medicine this morning.  Patient is awake alert she's oriented strength is 5 out of 5  Plan on checking EKG continue to finish fluid bolus check cardiac enzymes and blood work CBC and BMP check a chest x-ray and get internal medicine consult.

## 2015-11-13 NOTE — Consult Note (Signed)
Medical Consultation  Jasmine [name]. Russell HQI:[id number] DOB: August 18, 1955 DOA: 11/11/2015  PCP: Kenn File, MD  Chief Complaint: Hypotension  HPI:  Briefly, Jasmine [name] is a 60 year old Caucasian female past medical history significant for back pain, COPD, and essential hypertension. Patient was admitted to the neurosurgery service and underwent lumbar fusion on 9/17. The surgery was without complication and the patient was recovering as expected. However, this evening at approximately 1800, patient was found to be hypotensive with blood pressure 67/41 and repeat blood pressure 89/53. I was contacted by the neurosurgery team 4 internal medicine consultation. On my evaluation, patient was lying in bed and in no distress. She states she was sleeping when she was awakened by the nurse taking her vital signs when she was discovered to be hypotensive. Patient states she has a history of having low blood pressure in the past, but this was several years ago. More recently, patient was diagnosed with essential hypertension and takes amlodipine daily for hypertension. Of note, patient received oxycodone-acetaminophen at approximate 1700 for back pain. Specifically, patient denies any chest pain, shortness of breath, abdominal pain. She states she was "feeling fine" prior to this episode of hypotension.  Review of Systems: A complete ROS was obtained; pertinent positives negatives are denoted in the HPI. Otherwise, all systems are negative.   Past Medical History:  Diagnosis Date  . Allergy   . Anemia   . Anxiety   . Arthritis   . Asthma   . Carpal tunnel syndrome    bilateral  . Complication of anesthesia    in the past has had N/V, the last few surgeries she has not been  . COPD (chronic obstructive pulmonary disease) ([place])   . GERD (gastroesophageal reflux disease)   . Headache   . Heart murmur 2005   never had any problems  . Hypertension   . Hypoglycemia   . Hypoglycemia   .  Kidney stones   . Neuromuscular disorder ([place])    pt denies  . Neuropathy (HCC)    both arms  . Pneumonia   . Seizures ([place])    as a child when she would have an asthma attack. none as an adult  . Shortness of breath dyspnea    Social History   Social History  . Marital status: Married    Spouse name: N/A  . Number of children: N/A  . Years of education: N/A   Occupational History  . Not on file.   Social History Main Topics  . Smoking status: Current Every Day Smoker    Packs/day: 0.50    Types: Cigarettes  . Smokeless tobacco: Never Used     Comment: trying to quit  . Alcohol use No  . Drug use: No  . Sexual activity: Not on file   Other Topics Concern  . Not on file   Social History Narrative  . No narrative on file   Family History  Problem Relation Age of Onset  . Heart disease Mother   . Diabetes Mother   . Hypertension Mother   . Hyperlipidemia Mother   . COPD Mother   . COPD Father   . Diabetes Father   . Heart disease Father   . Cancer Father     throat  . Dementia Father   . Asthma Son     Physical Exam: Vitals:   11/13/15 0900 11/13/15 1300 11/13/15 1806 11/13/15 1842  BP: 130/72 Marland Kitchen)  104/48 (!) 67/41 (!) 89/53  Pulse: 98 87 79 71  Resp: '18 16 18   '$ Temp: 98.2 F (36.8 C) 98.2 F (36.8 C) 98.2 F (36.8 C)   TempSrc: Oral Oral Oral   SpO2: 98% 99% 94% 97%  Weight:      Height:       General: Appears calm and comfortable ENT: Grossly normal hearing, MMM Cardiovascular: RRR. No M/R/G. No LE edema.  Respiratory: CTA bilaterally. No wheezes or crackles. Abdomen: Soft, non-tender. Bowel sounds present.  Skin: No rash or induration seen on limited exam. Musculoskeletal: Grossly normal tone BUE/BLE, appropriate ROM.  Psychiatric: Grossly normal mood and affect, speech fluent. and appropriate. Neurologic: Moves all extremities in coordinated fashion.  I have personally reviewed the following labs, culture data, and imaging  studies.  Labs:  CBC is noteworthy for hemoglobin of 7.8. Previous hemoglobin was 9.7.  Cultures:  None  Radiology:  CXR (9/9) No active disease   Impression/Recommendations:  #1 Hypotension Currently, patient is hemodynamically stable and blood pressure has improved with IV fluids. I suspect hypotension was related to recent administration of narcotics. HgB has dropped slightly, but there is no sign of bleeding and abdomen is soft and nontender. Other potential reasons for hypotension include pulmonary embolus, though I doubt this given lack of tachycardia and oxygen saturation of 97% on room air. Patient specifically denies any chest pain or shortness of breath, thus making this diagnosis rather unlikely. Patient has no coronary disease. EKG shows nonspecific T-wave inversion but is without acute ischemic changes. The plan is to keep the patient on her current floor and not escalate to high level of care. I will check a lactic acid to ensure adequate tissue perfusion. I will give a bolus of normal saline. I will also discontinue the patient's narcotics and antihypertensive regimen of amlodipine.   Thank you for this consultation.  Our [facility] Hospitalist team will follow the patient.  Clydene Laming, M.D. [facility] Pager: [phone]

## 2015-11-13 NOTE — Progress Notes (Signed)
Patient called out for assistance. VS obtained. BP low; 67/51. Pt symptomatic; pale in color and diaphoretic. On call service called. Will continue to monitor and intervene PRN

## 2015-11-13 NOTE — Progress Notes (Signed)
Occupational Therapy Treatment Patient Details Name: Jasmine [name]. Eisner MRN: [id number] DOB: 08-02-1955 Today's Date: 11/13/2015    History of present illness 60 yo female s/p L4-5 revision of hardware L 3-4 fusion. Pt with pain in R buttocks and hip since September 07 2015. Pt sleeps in recliner due to pain with supine positioning pmh:   OT comments  Pt. Able to complete toileting, UB dressing, and basic grooming tasks with S.  Reports she will have family assist for LB ADLS at home.  Reports no other questions/concerns.  Stable from acute OT standpoint for d/c.  OTR/l to sign off.    Follow Up Recommendations       Equipment Recommendations       Recommendations for Other Services      Precautions / Restrictions Precautions Precautions: Back Spinal Brace: Lumbar corset;Applied in sitting position       Mobility Bed Mobility Overal bed mobility: Modified Independent Bed Mobility: Rolling;Sidelying to Sit Rolling: Modified independent (Device/Increase time) Sidelying to sit: Modified independent (Device/Increase time);HOB elevated       General bed mobility comments: pt. declined HOB flat stating she will likely be sleeping in recliner.  able to exit from elevated hob no rails in/out of bed  Transfers Overall transfer level: Needs assistance Equipment used: 4-wheeled walker Transfers: Sit to/from [employer] Sit to Stand: Supervision Stand pivot transfers: Supervision            Balance                                   ADL Overall ADL's : Needs assistance/impaired     Grooming: Wash/dry hands;Standing;Supervision/safety         Lower Body Bathing Details (indicate cue type and reason): declined review or introducation of A/E states she has available assistance as able until she can cross legs over knees to reach       Lower Body Dressing Details (indicate cue type and reason): declined review or introducation of A/E states she has  available assistance as able until she can cross legs over knees to reach Toilet Transfer: Supervision/safety;RW;Ambulation   Toileting- Clothing Manipulation and Hygiene: Supervision/safety;Sitting/lateral lean       Functional mobility during ADLs: Supervision/safety General ADL Comments: reviewed back precautions, pt. able to don/doff brace without assistance, declined LB ADLS states she will have assistance.  no other identified needs with ADLS per her report. will sign off      Vision                     Perception     Praxis      Cognition   Behavior During Therapy: [facility] for tasks assessed/performed Overall Cognitive Status: Within Functional Limits for tasks assessed                       Extremity/Trunk Assessment               Exercises     Shoulder Instructions       General Comments  pt. Reports she has handicap accessible walk in shower that her husband assists her in/out of and helps bathe her.  States they have "their routine down".  Denies any need for review.      Pertinent Vitals/ Pain       Pain Assessment: 0-10 Pain Score: 6  Pain Location: back Pain  Descriptors / Indicators: Operative site guarding Pain Intervention(s): Monitored during session;Repositioned  Home Living                                          Prior Functioning/Environment              Frequency Min 2X/week     Progress Toward Goals  OT Goals(current goals can now be found in the care plan section)  Progress towards OT goals: Goals met/education completed, patient discharged from [facility] Discharge plan remains appropriate    Co-evaluation                 End of Session Equipment Utilized During Treatment: Gait belt;Rolling walker;Back brace   Activity Tolerance Patient tolerated treatment well   Patient Left in bed;with call bell/phone within reach;with bed alarm set   Nurse Communication          Time:  4739-5844 OT Time Calculation (min): 17 min  Charges: OT General Charges $OT Visit: 1 Procedure OT Treatments $Self Care/Home Management : 8-22 mins  Jasmine [name], COTA/L 11/13/2015, 8:43 AM

## 2015-11-13 NOTE — Progress Notes (Signed)
Placed in trendelenburg position

## 2015-11-14 DIAGNOSIS — M4806 Spinal stenosis, lumbar region: Principal | ICD-10-CM

## 2015-11-14 LAB — CBC
HEMATOCRIT: 27.4 % — AB (ref 36.0–46.0)
HEMOGLOBIN: 8.4 g/dL — AB (ref 12.0–15.0)
MCH: 28.7 pg (ref 26.0–34.0)
MCHC: 30.7 g/dL (ref 30.0–36.0)
MCV: 93.5 fL (ref 78.0–100.0)
Platelets: 214 10*3/uL (ref 150–400)
RBC: 2.93 MIL/uL — AB (ref 3.87–5.11)
RDW: 15.8 % — ABNORMAL HIGH (ref 11.5–15.5)
WBC: 7.2 10*3/uL (ref 4.0–10.5)

## 2015-11-14 LAB — BASIC METABOLIC PANEL
Anion gap: 7 (ref 5–15)
CHLORIDE: 106 mmol/L (ref 101–111)
CO2: 23 mmol/L (ref 22–32)
Calcium: 8.4 mg/dL — ABNORMAL LOW (ref 8.9–10.3)
Creatinine, Ser: 0.39 mg/dL — ABNORMAL LOW (ref 0.44–1.00)
GFR calc non Af Amer: >60 mL/min (ref >60)
Glucose, Bld: 150 mg/dL — ABNORMAL HIGH (ref 65–99)
POTASSIUM: 3.6 mmol/L (ref 3.5–5.1)
SODIUM: 136 mmol/L (ref 135–145)

## 2015-11-14 LAB — GLUCOSE, CAPILLARY: Glucose-Capillary: 114 mg/dL — ABNORMAL HIGH (ref 65–99)

## 2015-11-14 LAB — PREPARE RBC (CROSSMATCH)

## 2015-11-14 MED ORDER — SODIUM CHLORIDE 0.9 % IV SOLN: Freq: Once | INTRAVENOUS | Status: DC

## 2015-11-14 MED ORDER — ENOXAPARIN SODIUM 40 MG/0.4ML ~~LOC~~ SOLN
40.0000 mg | SUBCUTANEOUS | Status: DC
  Administered 2015-11-14 – 2015-11-16 (×3): 40 mg via SUBCUTANEOUS
  Filled 2015-11-14 (×3): qty 0.4

## 2015-11-14 MED ORDER — HYDROCODONE-ACETAMINOPHEN 5-325 MG PO TABS
1.0000 | ORAL_TABLET | Freq: Four times a day (QID) | ORAL | Status: DC | PRN
  Administered 2015-11-14 (×2): 1 via ORAL
  Administered 2015-11-14 – 2015-11-15 (×3): 2 via ORAL
  Filled 2015-11-14: qty 2
  Filled 2015-11-14: qty 1
  Filled 2015-11-14: qty 2
  Filled 2015-11-14: qty 1
  Filled 2015-11-14: qty 2

## 2015-11-14 NOTE — Progress Notes (Signed)
Patient ID: Jasmine [name]. Tirrell, female   DOB: [birth year], 60 y.o.   MRN: [id number] She's doing better this morning no more episodes of hypotension overnight condition of pain localized to her back  Awake alert strength out of 5 wound clean dry and intact  Patient doing well episode of diaphoresis and hypertension appears to be related to narcotics. No more episodes patient's blood work stable except for anemia to which she's been to be transfused 1 unit of packed red cells per the internal medicine team. Appreciate [facility] assistants.

## 2015-11-14 NOTE — Progress Notes (Signed)
PROGRESS NOTE    Jaidynn Balster. Gee  TTS:[id number] DOB: 01-23-1956 DOA: 11/11/2015 PCP: Kenn File, MD  Brief Narrative:Ms. Turay is a 60 year old Caucasian female past medical history significant for back pain, COPD, and essential hypertension. Patient was admitted to the neurosurgery service and underwent lumbar fusion on 9/7. The surgery was without complication and the patient was recovering as expected. However, 9/9 evening at approximately 1800, patient was found to be hypotensive with blood pressure 67/41 and repeat blood pressure 89/53   Assessment & Plan:  1. Hypotension -likely due to narcotics and anemia -improving, stop IVF, 1 unit PRBC today -resume low dose Hydrocodone -stopped amlodipine  2. Acute Blood loss anemia -due to peri op blood loss and hemodilution -due to hypotension in this setting will transfuse 1 unit of PRBC today  Rest per NSG, we will FU tomorrow   DVT prophylaxis: added lovenox for DVT prophlylaxis Code Status:Full Code Family Communication:Husband at bedside Disposition Plan:Home per NSG    Subjective: C/o back pain, no chest pain or dyspnea  Objective: Vitals:   11/14/15 0515 11/14/15 0849 11/14/15 0940 11/14/15 1015  BP: 99/77  125/62 135/65  Pulse: 96  92 98  Resp: '18  19 18  '$ Temp: 99.6 F (37.6 C)  99.3 F (37.4 C) 98.5 F (36.9 C)  TempSrc: Oral  Oral Oral  SpO2: 94% 92% 97% 97%  Weight:      Height:       No intake or output data in the 24 hours ending 11/14/15 1107 Filed Weights   11/11/15 0937 11/11/15 2000  Weight: 48.1 kg (106 lb) 55.3 kg (122 lb)    Examination:  General exam: Appears calm, but uncomfortable  Respiratory system: diminished BS at bases Cardiovascular system: S1 & S2 heard, RRR. No JVD, murmurs, rubs, gallops or clicks. No pedal edema. Gastrointestinal system: Abdomen is nondistended, soft and nontender. No organomegaly or masses felt. Normal bowel sounds heard. Central nervous system: Alert  and oriented. No focal neurological deficits. Extremities: Symmetric 5 x 5 power. Skin: No rashes, lesions or ulcers Psychiatry: Judgement and insight appear normal. Mood & affect appropriate.     Data Reviewed: I have personally reviewed following labs and imaging studies  CBC:  Recent Labs Lab 11/11/15 0938 11/12/15 0620 11/13/15 1907 11/14/15 0740  WBC 8.3 9.8 8.4 7.2  HGB 12.8 9.7* 7.8* 8.4*  HCT 41.0 31.6* 26.4* 27.4*  MCV 93.0 92.9 95.7 93.5  PLT 235 227 179 092   Basic Metabolic Panel:  Recent Labs Lab 11/11/15 0938 11/12/15 0620 11/13/15 1907 11/14/15 0740  NA 141 139 138 136  K 3.7 3.5 3.0* 3.6  CL 108 106 107 106  CO2 '23 25 26 23  '$ GLUCOSE 101* 97 121* 150*  BUN 8 6 <5* <5*  CREATININE 0.50 0.52 0.52 0.39*  CALCIUM 9.6 8.9 8.3* 8.4*   GFR: Estimated Creatinine Clearance: 60.3 mL/min (by C-G formula based on SCr of 0.8 mg/dL). Liver Function Tests: No results for input(s): AST, ALT, ALKPHOS, BILITOT, PROT, ALBUMIN in the last 168 hours. No results for input(s): LIPASE, AMYLASE in the last 168 hours. No results for input(s): AMMONIA in the last 168 hours. Coagulation Profile: No results for input(s): INR, PROTIME in the last 168 hours. Cardiac Enzymes:  Recent Labs Lab 11/13/15 1907  CKTOTAL 308*  CKMB 1.5   BNP (last 3 results) No results for input(s): PROBNP in the last 8760 hours. HbA1C: No results for input(s): HGBA1C in the last 72 hours. CBG:  Recent Labs Lab 11/13/15 2107 11/14/15 0629  GLUCAP 121* 114*   Lipid Profile: No results for input(s): CHOL, HDL, LDLCALC, TRIG, CHOLHDL, LDLDIRECT in the last 72 hours. Thyroid Function Tests: No results for input(s): TSH, T4TOTAL, FREET4, T3FREE, THYROIDAB in the last 72 hours. Anemia Panel: No results for input(s): VITAMINB12, FOLATE, FERRITIN, TIBC, IRON, RETICCTPCT in the last 72 hours. Urine analysis:    Component Value Date/Time   COLORURINE YELLOW 09/08/2015 1057   APPEARANCEUR  CLEAR 09/08/2015 1057   LABSPEC 1.015 09/08/2015 1057   PHURINE 5.5 09/08/2015 1057   GLUCOSEU NEGATIVE 09/08/2015 1057   HGBUR NEGATIVE 09/08/2015 1057   BILIRUBINUR NEGATIVE 09/08/2015 1057   KETONESUR NEGATIVE 09/08/2015 1057   PROTEINUR NEGATIVE 09/08/2015 1057   NITRITE NEGATIVE 09/08/2015 1057   LEUKOCYTESUR NEGATIVE 09/08/2015 1057   Sepsis Labs: '@LABRCNTIP'$ (procalcitonin:4,lacticidven:4)  ) Recent Results (from the past 240 hour(s))  Surgical pcr screen     Status: None   Collection Time: 11/11/15 10:11 AM  Result Value Ref Range Status   MRSA, PCR NEGATIVE NEGATIVE Final   Staphylococcus aureus NEGATIVE NEGATIVE Final    Comment:        The Xpert SA Assay (FDA approved for NASAL specimens in patients over 43 years of age), is one component of a comprehensive surveillance program.  Test performance has been validated by [facility] for patients greater than or equal to 52 year old. It is not intended to diagnose infection nor to guide or monitor treatment.          Radiology Studies: Dg Chest Port 1 View  Result Date: 11/13/2015 CLINICAL DATA:  Shortness of breath EXAM: PORTABLE CHEST 1 VIEW COMPARISON:  None. FINDINGS: Spinal stimulators leads are seen over the mid thoracic spine. No pneumothorax. The heart size borderline. The hila and mediastinum are normal. No pulmonary nodules or masses. Mild interstitial prominence without overt edema. IMPRESSION: No active disease. Electronically Signed   By: Dorise Bullion III M.D   On: 11/13/2015 19:07        Scheduled Meds: . sodium chloride   Intravenous Once  . aspirin  325 mg Oral Daily  . docusate sodium  100 mg Oral BID  . DULoxetine  90 mg Oral BID  . lidocaine  1 patch Transdermal Q24H  . mometasone-formoterol  2 puff Inhalation BID  . pantoprazole  40 mg Oral Daily  . senna  1 tablet Oral BID  . sodium chloride flush  3 mL Intravenous Q12H  . tiZANidine  6 mg Oral TID  . traZODone  100 mg Oral QHS     Continuous Infusions: . sodium chloride       LOS: 3 days    Time spent: 49mn    PDomenic Polite MD [facility] Pager [phone] If 7PM-7AM, please contact night-coverage [email or web address] Password [email or web address] 11/14/2015, 11:07 AM

## 2015-11-15 DIAGNOSIS — D62 Acute posthemorrhagic anemia: Secondary | ICD-10-CM | POA: Diagnosis present

## 2015-11-15 DIAGNOSIS — I959 Hypotension, unspecified: Secondary | ICD-10-CM | POA: Diagnosis present

## 2015-11-15 HISTORY — DX: Acute posthemorrhagic anemia: D62

## 2015-11-15 LAB — BASIC METABOLIC PANEL
ANION GAP: 7 (ref 5–15)
BUN: <5 mg/dL — ABNORMAL LOW (ref 6–20)
CO2: 25 mmol/L (ref 22–32)
Calcium: 9.2 mg/dL (ref 8.9–10.3)
Chloride: 107 mmol/L (ref 101–111)
Creatinine, Ser: 0.4 mg/dL — ABNORMAL LOW (ref 0.44–1.00)
Glucose, Bld: 107 mg/dL — ABNORMAL HIGH (ref 65–99)
POTASSIUM: 3.7 mmol/L (ref 3.5–5.1)
SODIUM: 139 mmol/L (ref 135–145)

## 2015-11-15 LAB — CBC
HEMATOCRIT: 33.6 % — AB (ref 36.0–46.0)
HEMOGLOBIN: 10.2 g/dL — AB (ref 12.0–15.0)
MCH: 28.3 pg (ref 26.0–34.0)
MCHC: 30.4 g/dL (ref 30.0–36.0)
MCV: 93.1 fL (ref 78.0–100.0)
Platelets: 255 10*3/uL (ref 150–400)
RBC: 3.61 MIL/uL — ABNORMAL LOW (ref 3.87–5.11)
RDW: 16.8 % — AB (ref 11.5–15.5)
WBC: 6.2 10*3/uL (ref 4.0–10.5)

## 2015-11-15 LAB — TYPE AND SCREEN
ABO/RH(D): A POS
ANTIBODY SCREEN: NEGATIVE
UNIT DIVISION: 0

## 2015-11-15 MED ORDER — OXYCODONE-ACETAMINOPHEN 5-325 MG PO TABS
1.0000 | ORAL_TABLET | ORAL | Status: DC | PRN
  Administered 2015-11-15 – 2015-11-16 (×5): 2 via ORAL
  Filled 2015-11-15 (×6): qty 2

## 2015-11-15 NOTE — Clinical Social Work Note (Signed)
CSW consulted for New SNF. PT is not recommending follow up. CSW discussed pt with RNCM, who will follow for discharge planning needs, if appropriate. CSW is signing off as no further needs identified.   Darden Dates, MSW, LCSW Clinical Social Worker  [phone]

## 2015-11-15 NOTE — Progress Notes (Addendum)
PROGRESS NOTE    Jasmine [name]. Luna  DGL:[id number] DOB: [birth year] DOA: 11/11/2015 PCP: Kenn File, MD  Brief Narrative:Jasmine [name] is a 60 year old Caucasian female past medical history significant for back pain, COPD, and essential hypertension. Patient was admitted to the neurosurgery service and underwent lumbar fusion on 9/7. The surgery was without complication and the patient was recovering as expected. However, 9/9 evening at approximately 1800, patient was found to be hypotensive with blood pressure 67/41 and repeat blood pressure 89/53   Assessment & Plan:  1. Hypotension -likely due to narcotics and anemia -improved, BP back to baseline, s/p 1 unit PRBC 9/10 -resumed Hydrocodone yesterday, but she is used to much more narcotics at baseline and takes Hysingla ER( Long acting Hydrocodone) -stopped amlodipine -defer pain mgt to Dr.Nundkumar -PT consulted, medically stable for discharge, have ordered Amlodipine to start 9/12, will sign off please call with concerns   2. Acute Blood loss anemia -due to peri op blood loss and hemodilution -due to hypotension in this setting was transfused 1 unit of PRBC 9/10 -Bp and Hb improved  DVT prophylaxis: added lovenox for DVT prophlylaxis Code Status:Full Code Family Communication:Jasmine [name] at bedside Disposition Plan:Home per NSG    Subjective: C/o back pain, no chest pain or dyspnea  Objective: Vitals:   11/14/15 2242 11/15/15 0130 11/15/15 0448 11/15/15 0919  BP:  133/80 (!) 157/67 (!) 158/61  Pulse: 81 80 79 87  Resp: '16 16 16 18  '$ Temp:  97.9 F (36.6 C) 97.9 F (36.6 C) 98.1 F (36.7 C)  TempSrc:  Oral Oral Oral  SpO2: 95% 98% 97% 95%  Weight:      Height:        Intake/Output Summary (Last 24 hours) at 11/15/15 1137 Last data filed at 11/15/15 0825  Gross per 24 hour  Intake              615 ml  Output                0 ml  Net              615 ml   Filed Weights   11/11/15 0937 11/11/15 2000  Weight: 48.1  kg (106 lb) 55.3 kg (122 lb)    Examination:  General exam: Appears calm, but uncomfortable  Respiratory system: diminished BS at bases Cardiovascular system: S1 & S2 heard, RRR. No JVD, murmurs, rubs, gallops or clicks. No pedal edema. Gastrointestinal system: Abdomen is nondistended, soft and nontender. No organomegaly or masses felt. Normal bowel sounds heard. Central nervous system: Alert and oriented. No focal neurological deficits. Extremities: Symmetric 5 x 5 power. Skin: No rashes, lesions or ulcers Psychiatry: Judgement and insight appear normal. Mood & affect appropriate.     Data Reviewed: I have personally reviewed following labs and imaging studies  CBC:  Recent Labs Lab 11/11/15 0938 11/12/15 0620 11/13/15 1907 11/14/15 0740 11/15/15 0529  WBC 8.3 9.8 8.4 7.2 6.2  HGB 12.8 9.7* 7.8* 8.4* 10.2*  HCT 41.0 31.6* 26.4* 27.4* 33.6*  MCV 93.0 92.9 95.7 93.5 93.1  PLT 235 227 179 214 518   Basic Metabolic Panel:  Recent Labs Lab 11/11/15 0938 11/12/15 0620 11/13/15 1907 11/14/15 0740 11/15/15 0529  NA 141 139 138 136 139  K 3.7 3.5 3.0* 3.6 3.7  CL 108 106 107 106 107  CO2 '23 25 26 23 25  '$ GLUCOSE 101* 97 121* 150* 107*  BUN 8 6 <5* <5* <5*  CREATININE 0.50 0.52  0.52 0.39* 0.40*  CALCIUM 9.6 8.9 8.3* 8.4* 9.2   GFR: Estimated Creatinine Clearance: 60.3 mL/min (by C-G formula based on SCr of 0.8 mg/dL). Liver Function Tests: No results for input(s): AST, ALT, ALKPHOS, BILITOT, PROT, ALBUMIN in the last 168 hours. No results for input(s): LIPASE, AMYLASE in the last 168 hours. No results for input(s): AMMONIA in the last 168 hours. Coagulation Profile: No results for input(s): INR, PROTIME in the last 168 hours. Cardiac Enzymes:  Recent Labs Lab 11/13/15 1907  CKTOTAL 308*  CKMB 1.5   BNP (last 3 results) No results for input(s): PROBNP in the last 8760 hours. HbA1C: No results for input(s): HGBA1C in the last 72 hours. CBG:  Recent  Labs Lab 11/13/15 2107 11/14/15 0629  GLUCAP 121* 114*   Lipid Profile: No results for input(s): CHOL, HDL, LDLCALC, TRIG, CHOLHDL, LDLDIRECT in the last 72 hours. Thyroid Function Tests: No results for input(s): TSH, T4TOTAL, FREET4, T3FREE, THYROIDAB in the last 72 hours. Anemia Panel: No results for input(s): VITAMINB12, FOLATE, FERRITIN, TIBC, IRON, RETICCTPCT in the last 72 hours. Urine analysis:    Component Value Date/Time   COLORURINE YELLOW 09/08/2015 1057   APPEARANCEUR CLEAR 09/08/2015 1057   LABSPEC 1.015 09/08/2015 1057   PHURINE 5.5 09/08/2015 1057   GLUCOSEU NEGATIVE 09/08/2015 1057   HGBUR NEGATIVE 09/08/2015 1057   BILIRUBINUR NEGATIVE 09/08/2015 1057   KETONESUR NEGATIVE 09/08/2015 1057   PROTEINUR NEGATIVE 09/08/2015 1057   NITRITE NEGATIVE 09/08/2015 1057   LEUKOCYTESUR NEGATIVE 09/08/2015 1057   Sepsis Labs: '@LABRCNTIP'$ (procalcitonin:4,lacticidven:4)  ) Recent Results (from the past 240 hour(s))  Surgical pcr screen     Status: None   Collection Time: 11/11/15 10:11 AM  Result Value Ref Range Status   MRSA, PCR NEGATIVE NEGATIVE Final   Staphylococcus aureus NEGATIVE NEGATIVE Final    Comment:        The Xpert SA Assay (FDA approved for NASAL specimens in patients over 75 years of age), is one component of a comprehensive surveillance program.  Test performance has been validated by [facility] for patients greater than or equal to 78 year old. It is not intended to diagnose infection nor to guide or monitor treatment.          Radiology Studies: Dg Chest Port 1 View  Result Date: 11/13/2015 CLINICAL DATA:  Shortness of breath EXAM: PORTABLE CHEST 1 VIEW COMPARISON:  None. FINDINGS: Spinal stimulators leads are seen over the mid thoracic spine. No pneumothorax. The heart size borderline. The hila and mediastinum are normal. No pulmonary nodules or masses. Mild interstitial prominence without overt edema. IMPRESSION: No active disease.  Electronically Signed   By: Dorise Bullion III M.D   On: 11/13/2015 19:07        Scheduled Meds: . sodium chloride   Intravenous Once  . aspirin  325 mg Oral Daily  . docusate sodium  100 mg Oral BID  . DULoxetine  90 mg Oral BID  . enoxaparin (LOVENOX) injection  40 mg Subcutaneous Q24H  . lidocaine  1 patch Transdermal Q24H  . mometasone-formoterol  2 puff Inhalation BID  . pantoprazole  40 mg Oral Daily  . senna  1 tablet Oral BID  . sodium chloride flush  3 mL Intravenous Q12H  . tiZANidine  6 mg Oral TID  . traZODone  100 mg Oral QHS   Continuous Infusions: . sodium chloride Stopped (11/14/15 2008)     LOS: 4 days    Time spent: 101mn  Domenic Polite, MD [facility] Pager [phone]  If 7PM-7AM, please contact night-coverage [email or web address] Password [facility] 11/15/2015, 11:37 AM

## 2015-11-15 NOTE — Progress Notes (Signed)
Physical Therapy Treatment Patient Details Name: Jasmine [name]. Zern MRN: [id number] DOB: Apr 28, 1955 Today's Date: 11/15/2015    History of Present Illness 60 yo female s/p L4-5 revision of hardware L 3-4 fusion. Pt with pain in R buttocks and hip since September 07 2015. Pt sleeps in recliner due to pain with supine positioning pmh:    PT Comments    Progressing well.  Percocet by mouth seemed to manage her pain.  All education completed.  Pt will be safe at home with husband's assist.   Follow Up Recommendations  No PT follow up;Supervision for mobility/OOB     Equipment Recommendations  None recommended by PT    Recommendations for Other Services       Precautions / Restrictions Precautions Precautions: Back Precaution Comments: reviewed all 3 precautions with pt and spouse. Required Braces or Orthoses: Spinal Brace;Other Brace/Splint Spinal Brace: Lumbar corset;Applied in sitting position Other Brace/Splint: ace wrap to right foot due to foot drop    Mobility  Bed Mobility Overal bed mobility: Modified Independent Bed Mobility: Rolling;Sidelying to Sit;Sit to Sidelying Rolling: Modified independent (Device/Increase time) Sidelying to sit: Modified independent (Device/Increase time)     Sit to sidelying: Modified independent (Device/Increase time)    Transfers Overall transfer level: Needs assistance   Transfers: Sit to/from Stand Sit to Stand: Supervision         General transfer comment: Close to independent,  Cued for hand placement safety when using the rollator.  Ambulation/Gait Ambulation/Gait assistance: Supervision Ambulation Distance (Feet): 240 Feet Assistive device: 4-wheeled walker   Gait velocity: decreased Gait velocity interpretation: Below normal speed for age/gender General Gait Details: Smooth gait with rollator.  Still some steppage on the right even with df assist of ACE wrap.   Stairs            Wheelchair Mobility    Modified  Rankin (Stroke Patients Only)       Balance                                    Cognition Arousal/Alertness: Awake/alert Behavior During Therapy: WFL for tasks assessed/performed Overall Cognitive Status: Within Functional Limits for tasks assessed                      Exercises      General Comments General comments (skin integrity, edema, etc.): Reinforce all back care/precautions with pt/husband especially progression of activity.      Pertinent Vitals/Pain Pain Assessment: Faces Faces Pain Scale: Hurts even more Pain Location: back Pain Descriptors / Indicators: Sore;Grimacing Pain Intervention(s): Monitored during session;Premedicated before session;Repositioned    Home Living                      Prior Function            PT Goals (current goals can now be found in the care plan section) Acute Rehab PT Goals Patient Stated Goal: keep pain under control, get use of my foot back. PT Goal Formulation: With patient Time For Goal Achievement: 11/19/15 Potential to Achieve Goals: Good Progress towards PT goals: Progressing toward goals    Frequency  Min 5X/week    PT Plan Current plan remains appropriate    Co-evaluation             End of Session Equipment Utilized During Treatment: Back brace Activity Tolerance: Patient tolerated treatment  well;No increased pain Patient left: in bed;with call bell/phone within reach;with family/visitor present     Time: 6948-5462 PT Time Calculation (min) (ACUTE ONLY): 25 min  Charges:  $Gait Training: 8-22 mins $Therapeutic Activity: 8-22 mins                    G Codes:      Jasmine [name], Jasmine [name] 11/15/2015, 3:11 PM  11/15/2015  Jasmine [name], PT [phone]  (pager)

## 2015-11-16 MED ORDER — OXYCODONE-ACETAMINOPHEN 5-325 MG PO TABS
1.0000 | ORAL_TABLET | ORAL | Status: DC | PRN
  Administered 2015-11-16: 1 via ORAL
  Filled 2015-11-16 (×2): qty 1

## 2015-11-16 MED ORDER — AMLODIPINE BESYLATE 10 MG PO TABS
10.0000 mg | ORAL_TABLET | Freq: Every day | ORAL | Status: DC
  Administered 2015-11-16: 10 mg via ORAL
  Filled 2015-11-16: qty 1

## 2015-11-16 MED ORDER — OXYCODONE HCL 10 MG PO TABS: 10.0000 mg | ORAL_TABLET | ORAL | 0 refills | Status: DC | PRN

## 2015-11-16 MED ORDER — OXYCODONE-ACETAMINOPHEN 5-325 MG PO TABS: 1.0000 | ORAL_TABLET | ORAL | 0 refills | Status: DC | PRN

## 2015-11-16 MED ORDER — OXYCODONE HCL 5 MG PO TABS
10.0000 mg | ORAL_TABLET | ORAL | Status: DC | PRN
  Administered 2015-11-16 (×2): 10 mg via ORAL
  Filled 2015-11-16 (×2): qty 2

## 2015-11-16 MED ORDER — METHOCARBAMOL 500 MG PO TABS: 500.0000 mg | ORAL_TABLET | Freq: Four times a day (QID) | ORAL | 0 refills | Status: DC | PRN

## 2015-11-16 NOTE — Discharge Summary (Signed)
Physician Discharge Summary  Patient ID: Jasmine [name] MRN: [id number] DOB/AGE: 62/07/1955 62 y.o.  Admit date: 11/11/2015 Discharge date: 11/16/2015  Admission Diagnoses:  Lumbar stenosis Foot drop Pseudoarthrosis  Discharge Diagnoses: Same Active Problems:   Lumbar stenosis   Hypotension   Postoperative anemia due to acute blood loss   Discharged Condition: Stable  Hospital Course:  Jasmine [name] is a 60 y.o. female electively admitted for lumbar decompression/fusion, initially presenting with chronic back and right leg pain, and subacute right foot drop. She was at baseline postop. She began to ambulate but did have significant postoperative pain. She was hypotensive over the weekend and the internal medicine service was consulted. Hypotension was likely related to narcotic medications. IV meds were d/c'ed, and her antihypertensives were also held. She remained normotensive after this. She was ambulating with rolling walker and was seen by PT/OT and safe to go home.  Treatments: Surgery - L4-5 PLIF, L3-5 posterolateral instrumented fusion  Discharge Exam: Blood pressure (!) 141/68, pulse 86, temperature 98.8 F (37.1 C), temperature source Oral, resp. rate 18, height '5\' 3"'$  (1.6 m), weight 55.3 kg (122 lb), SpO2 99 %. Awake, alert, oriented Speech fluent, appropriate CN grossly intact 5/5 BUE/BLE, complete right foot drop Wound c/d/i  Disposition: 01-Home or Self Care     Medication List    STOP taking these medications   amLODipine 10 MG tablet Commonly known as:  NORVASC   HYDROcodone-acetaminophen 10-325 MG tablet Commonly known as:  NORCO   HYSINGLA ER 60 MG T24a Generic drug:  HYDROcodone Bitartrate ER     TAKE these medications   albuterol (2.5 MG/3ML) 0.083% nebulizer solution Commonly known as:  PROVENTIL Take 3 mLs (2.5 mg total) by nebulization every 6 (six) hours as needed for wheezing or shortness of breath.   albuterol 108 (90 Base)  MCG/ACT inhaler Commonly known as:  PROVENTIL HFA;VENTOLIN HFA Inhale 2 puffs into the lungs every 6 (six) hours as needed for wheezing or shortness of breath.   BC HEADACHE POWDER PO Take 1 Package by mouth daily as needed (headaches).   budesonide-formoterol 160-4.5 MCG/ACT inhaler Commonly known as:  SYMBICORT Inhale 2 puffs into the lungs 2 (two) times daily.   cetirizine 10 MG tablet Commonly known as:  ZYRTEC Take 10 mg by mouth daily as needed for allergies. Reported on 03/12/2015   DULoxetine 30 MG capsule Commonly known as:  CYMBALTA Take 3 capsules (90 mg total) by mouth daily. What changed:  how much to take  when to take this   methocarbamol 500 MG tablet Commonly known as:  ROBAXIN Take 1 tablet (500 mg total) by mouth every 6 (six) hours as needed for muscle spasms.   Oxycodone HCl 10 MG Tabs Take 1 tablet (10 mg total) by mouth every 4 (four) hours as needed for severe pain.   oxyCODONE-acetaminophen 5-325 MG tablet Commonly known as:  PERCOCET/ROXICET Take 1 tablet by mouth every 4 (four) hours as needed for severe pain.   pantoprazole 40 MG tablet Commonly known as:  PROTONIX Take 1 tablet (40 mg total) by mouth daily.   tiZANidine 4 MG tablet Commonly known as:  ZANAFLEX Take 6 mg by mouth 3 (three) times daily.   traZODone 100 MG tablet Commonly known as:  DESYREL Take 0.5-1 tablets (50-100 mg total) by mouth at bedtime. What changed:  how much to take      Follow-up Information    Adriann Thau, C, MD Follow up in 3 week(s).  Specialty:  Neurosurgery Contact information: [address] [place] [address] [place] [address] [phone]           Signed: Consuella Lose, Loletha Grayer 11/16/2015, 6:01 PM

## 2015-11-16 NOTE — Progress Notes (Signed)
No issues overnight. Still has significant back / leg pain but appears better controlled with current medication (oxycodone '10mg'$  + percocet 325/5). Able to ambulate   EXAM:  BP (!) 141/68 (BP Location: Right Arm)   Pulse 86   Temp 98.8 F (37.1 C) (Oral)   Resp 18   Ht '5\' 3"'$  (1.6 m)   Wt 55.3 kg (122 lb)   SpO2 99%   BMI 21.61 kg/m   Awake, alert, oriented  Speech fluent, appropriate  CN grossly intact  5/5 BUE/BLE  Stable right foot drop Wound c/d/i  IMPRESSION:  60 y.o. female s/p lumbar decompression/fusion L3-5, recovering as expected  PLAN: - Will d/c home today - f/u with me in 2-3 weeks

## 2015-11-16 NOTE — Progress Notes (Addendum)
No issues overnight. BP has been ok. She reports continued somewhat severe back and hip pain, not controlled well with hydrocodone.  EXAM:  BP (!) 152/79 (BP Location: Left Arm)   Pulse 77   Temp 98.3 F (36.8 C) (Oral)   Resp 18   Ht '5\' 3"'$  (1.6 m)   Wt 55.3 kg (122 lb)   SpO2 100%   BMI 21.61 kg/m   Awake, alert, oriented  Speech fluent, appropriate  CN grossly intact  5/5 BUE/BLE x right foot drop Wound c/d/i  IMPRESSION:  60 y.o. female s/p lumbar decompression/extension of fusion. Cont to have back pain  PLAN: - Will change hydrocodone to percocet - Will add robaxin - Cont to mobilize

## 2015-11-16 NOTE — Progress Notes (Signed)
Patient is being dc home. Dc instructions given and patient verbalized understanding. Patient given the remaining tablet(1) from the pyxis.

## 2015-11-16 NOTE — Care Management Note (Signed)
Case Management Note  Patient Details  Name: Jasmine [name]. Jasmine [name] MRN: [id number] Date of Birth: 07-16-1955  Subjective/Objective:    Pt s/p lumbar surgery. She is from home with spouse.                 Action/Plan: No f/u per PT. OT recommending outpatient services. CM following for d/c needs.   Expected Discharge Date:                  Expected Discharge Plan:  Home/Self Care  In-House Referral:     Discharge planning Services     Post Acute Care Choice:    Choice offered to:     DME Arranged:    DME Agency:     HH Arranged:    HH Agency:     Status of Service:  In process, will continue to follow  If discussed at Long Length of Stay Meetings, dates discussed:    Additional Comments:  Pollie Friar, RN 11/16/2015, 3:35 PM

## 2015-11-16 NOTE — Progress Notes (Signed)
Patient complaining of pain 7 out of 10, patient cannot get percocet because she has exceeded 4000 mg of tylenol within 24 hours. MD's office called and message left with her to call RN back.

## 2015-11-16 NOTE — Progress Notes (Signed)
PT Cancellation Note  Patient Details Name: Jasmine [name] MRN: [id number] DOB: 01/28/1956   Cancelled Treatment:    Reason Eval/Treat Not Completed: Patient declined, no reason specified.  Bad day. 11/16/2015  [name], [place] [phone]  (pager)   Mell Mellott, [name] 11/16/2015, 5:08 PM

## 2015-11-16 NOTE — Progress Notes (Signed)
Pt resistant to ambulating says her pain is not under control, also saying that the percocet is not controlling  her pain, educated patient on benefits of ambulation relative to pain control, will continue to monitor.

## 2015-11-25 ENCOUNTER — Telehealth: Payer: Self-pay | Admitting: Family Medicine

## 2015-11-25 NOTE — Telephone Encounter (Signed)
Patient will contact surgeon

## 2015-11-29 ENCOUNTER — Encounter: Admitting: *Deleted

## 2015-12-08 ENCOUNTER — Telehealth: Payer: Self-pay | Admitting: Family Medicine

## 2015-12-08 NOTE — Telephone Encounter (Signed)
Husband states that Dr.Numdkumar states that she needs a rx for metanx and that she could get it from her PCP. Husband Jeneen Rinks would like to know if you could send one in or does patient need to be seen? Please advise

## 2015-12-09 MED ORDER — L-METHYLFOLATE-B6-B12 3-35-2 MG PO TABS: 1.0000 | ORAL_TABLET | Freq: Two times a day (BID) | ORAL | 5 refills | Status: DC

## 2015-12-09 NOTE — Telephone Encounter (Signed)
Rx Sent  Laroy Apple, MD [facility] 12/09/2015, 7:45 AM

## 2015-12-09 NOTE — Telephone Encounter (Signed)
Pt aware Rx sent to pharmacy 

## 2015-12-10 ENCOUNTER — Telehealth: Payer: Self-pay | Admitting: Family Medicine

## 2015-12-10 ENCOUNTER — Other Ambulatory Visit: Payer: Self-pay | Admitting: *Deleted

## 2015-12-13 ENCOUNTER — Telehealth: Payer: Self-pay | Admitting: Family Medicine

## 2015-12-13 MED ORDER — L-METHYLFOLATE-B6-B12 3-35-2 MG PO TABS: 1.0000 | ORAL_TABLET | Freq: Two times a day (BID) | ORAL | 5 refills | Status: DC

## 2015-12-13 NOTE — Telephone Encounter (Signed)
Medication sent to [facility]

## 2015-12-16 NOTE — Telephone Encounter (Signed)
Waiting on order

## 2016-01-03 ENCOUNTER — Encounter: Payer: Self-pay | Admitting: Family Medicine

## 2016-01-03 ENCOUNTER — Ambulatory Visit (INDEPENDENT_AMBULATORY_CARE_PROVIDER_SITE_OTHER): Admitting: Family Medicine

## 2016-01-03 VITALS — BP 172/90 | HR 82 | Temp 98.1°F | Ht 63.0 in | Wt 102.8 lb

## 2016-01-03 DIAGNOSIS — Z23 Encounter for immunization: Secondary | ICD-10-CM | POA: Diagnosis not present

## 2016-01-03 DIAGNOSIS — F4320 Adjustment disorder, unspecified: Secondary | ICD-10-CM | POA: Diagnosis not present

## 2016-01-03 DIAGNOSIS — D62 Acute posthemorrhagic anemia: Secondary | ICD-10-CM

## 2016-01-03 DIAGNOSIS — F4321 Adjustment disorder with depressed mood: Secondary | ICD-10-CM

## 2016-01-03 DIAGNOSIS — J449 Chronic obstructive pulmonary disease, unspecified: Secondary | ICD-10-CM | POA: Diagnosis not present

## 2016-01-03 DIAGNOSIS — I10 Essential (primary) hypertension: Secondary | ICD-10-CM | POA: Diagnosis not present

## 2016-01-03 MED ORDER — LORAZEPAM 0.5 MG PO TABS: 0.2500 mg | ORAL_TABLET | Freq: Four times a day (QID) | ORAL | 0 refills | Status: DC | PRN

## 2016-01-03 MED ORDER — AMLODIPINE BESYLATE 10 MG PO TABS: 10.0000 mg | ORAL_TABLET | Freq: Every day | ORAL | 3 refills | Status: DC

## 2016-01-03 NOTE — Progress Notes (Signed)
   HPI  Patient presents today here for routine follow-up, also hospital follow-up.  Patient was hospitalized about 6 weeks ago for lumbar laminectomy, she has improved quite a bit since discharge.  She had postoperative acute anemia requiring transfusion.  For COPD feels well controlled with Symbicort. She states her breathing is at baseline.  She states that her mother has just found out she has stage IV metastatic cancer and she needs to go to [place] to take care of her affairs. She requests some Ativan to help her deal with anxiety and stress. She has a pain contract and understands to notify her pain clinic prior to any controlled substance refill  Her blood pressure has been 160s and 170s at home most the time. Denies chest pain, worsening shortness breath, leg swelling, or palpitations.   PMH: Smoking status noted ROS: Per HPI  Objective: BP (!) 172/90   Pulse 82   Temp 98.1 F (36.7 C) (Oral)   Ht '5\' 3"'$  (1.6 m)   Wt 102 lb 12.8 oz (46.6 kg)   BMI 18.21 kg/m  Gen: NAD, alert, cooperative with exam HEENT: NCAT, EOMI, PERRL CV: RRR, good S1/S2, no murmur Resp: CTABL, no wheezes, non-labored Abd: SNTND, BS present, no guarding or organomegaly Ext: No edema, warm Neuro: Alert and oriented, No gross deficits  Assessment and plan:  # Hypertension Uncontrolled today Medications were de-escalating on discharge due to postoperative hypotension likely due from postoperative anemia Restart amlodipine May need to add ACE inhibitor  # Grief reaction Understandable stress given her mother's severe illness Small amount of Ativan, discussed short-term treatment and made sure that she will notify her pain clinic prior to filling the prescription  # Postoperative anemia due to blood loss Repeating CBC, considering her blood pressure she's likely recovered well  # COPD At baseline Continue Symbicort and as needed albuterol  Meds ordered this encounter  Medications  .  HYDROcodone-acetaminophen (NORCO) 10-325 MG tablet    Sig: Take 1 tablet by mouth 3 (three) times daily.  Marland Kitchen HYDROcodone Bitartrate ER (HYSINGLA ER) 60 MG T24A    Sig: Take by mouth.    Laroy Apple, MD [facility] 01/03/2016, 2:17 PM

## 2016-01-03 NOTE — Patient Instructions (Signed)
Great to see you!  Lets see you in early January, please call me if your blood pressures are consistently over 088 systolic 1 week after restarting amlodipine.   Be sur eto let the pain clinic know you have a short term ativan Rx

## 2016-01-04 LAB — CBC WITH DIFFERENTIAL/PLATELET
Basophils Absolute: 0.1 10*3/uL (ref 0.0–0.2)
Basos: 1 %
EOS (ABSOLUTE): 0.1 10*3/uL (ref 0.0–0.4)
EOS: 2 %
HEMATOCRIT: 39.5 % (ref 34.0–46.6)
HEMOGLOBIN: 12.9 g/dL (ref 11.1–15.9)
Immature Grans (Abs): 0 10*3/uL (ref 0.0–0.1)
Immature Granulocytes: 0 %
LYMPHS ABS: 2 10*3/uL (ref 0.7–3.1)
Lymphs: 32 %
MCH: 28.9 pg (ref 26.6–33.0)
MCHC: 32.7 g/dL (ref 31.5–35.7)
MCV: 88 fL (ref 79–97)
MONOCYTES: 9 %
MONOS ABS: 0.5 10*3/uL (ref 0.1–0.9)
NEUTROS ABS: 3.4 10*3/uL (ref 1.4–7.0)
Neutrophils: 56 %
Platelets: 333 10*3/uL (ref 150–379)
RBC: 4.47 x10E6/uL (ref 3.77–5.28)
RDW: 15.8 % — AB (ref 12.3–15.4)
WBC: 6.1 10*3/uL (ref 3.4–10.8)

## 2016-01-04 LAB — CMP14+EGFR
A/G RATIO: 1.5 (ref 1.2–2.2)
ALBUMIN: 4.3 g/dL (ref 3.6–4.8)
ALK PHOS: 99 IU/L (ref 39–117)
ALT: 14 IU/L (ref 0–32)
AST: 20 IU/L (ref 0–40)
BUN / CREAT RATIO: 15 (ref 12–28)
BUN: 8 mg/dL (ref 8–27)
CHLORIDE: 101 mmol/L (ref 96–106)
CO2: 26 mmol/L (ref 18–29)
CREATININE: 0.54 mg/dL — AB (ref 0.57–1.00)
Calcium: 9.6 mg/dL (ref 8.7–10.3)
GFR calc Af Amer: 117 mL/min/{1.73_m2} (ref >59)
GFR calc non Af Amer: 101 mL/min/{1.73_m2} (ref >59)
GLOBULIN, TOTAL: 2.9 g/dL (ref 1.5–4.5)
Glucose: 103 mg/dL — ABNORMAL HIGH (ref 65–99)
POTASSIUM: 4.2 mmol/L (ref 3.5–5.2)
SODIUM: 143 mmol/L (ref 134–144)
Total Protein: 7.2 g/dL (ref 6.0–8.5)

## 2016-01-18 ENCOUNTER — Encounter: Payer: Self-pay | Admitting: Family Medicine

## 2016-01-19 MED ORDER — TRAZODONE HCL 100 MG PO TABS: 100.0000 mg | ORAL_TABLET | Freq: Every day | ORAL | 0 refills | Status: DC

## 2016-01-24 ENCOUNTER — Telehealth: Payer: Self-pay | Admitting: Family Medicine

## 2016-01-28 NOTE — Telephone Encounter (Signed)
Ready, pt aware

## 2016-02-11 ENCOUNTER — Telehealth: Payer: Self-pay | Admitting: Family Medicine

## 2016-02-11 NOTE — Telephone Encounter (Signed)
Please address as Wendi Snipes is her PCP -

## 2016-02-11 NOTE — Telephone Encounter (Signed)
Zapk #1 take as directed.  May send to local [place] or in [place]

## 2016-02-14 MED ORDER — AZITHROMYCIN 250 MG PO TABS: ORAL_TABLET | ORAL | 0 refills | Status: DC

## 2016-02-17 ENCOUNTER — Encounter: Payer: Self-pay | Admitting: Family Medicine

## 2016-02-18 ENCOUNTER — Encounter: Payer: Self-pay | Admitting: Family Medicine

## 2016-02-19 ENCOUNTER — Encounter: Payer: Self-pay | Admitting: Family Medicine

## 2016-02-20 ENCOUNTER — Encounter: Payer: Self-pay | Admitting: Family Medicine

## 2016-02-29 DIAGNOSIS — Z0289 Encounter for other administrative examinations: Secondary | ICD-10-CM

## 2016-03-01 ENCOUNTER — Encounter: Admitting: *Deleted

## 2016-03-06 DIAGNOSIS — Z9289 Personal history of other medical treatment: Secondary | ICD-10-CM

## 2016-03-06 HISTORY — DX: Personal history of other medical treatment: Z92.89

## 2016-03-16 ENCOUNTER — Encounter: Payer: Self-pay | Admitting: Family Medicine

## 2016-03-16 ENCOUNTER — Ambulatory Visit (INDEPENDENT_AMBULATORY_CARE_PROVIDER_SITE_OTHER): Admitting: Family Medicine

## 2016-03-16 VITALS — BP 148/86 | HR 95 | Temp 97.9°F | Ht 63.0 in | Wt 107.2 lb

## 2016-03-16 DIAGNOSIS — F4320 Adjustment disorder, unspecified: Secondary | ICD-10-CM | POA: Diagnosis not present

## 2016-03-16 DIAGNOSIS — E876 Hypokalemia: Secondary | ICD-10-CM | POA: Diagnosis not present

## 2016-03-16 DIAGNOSIS — J449 Chronic obstructive pulmonary disease, unspecified: Secondary | ICD-10-CM

## 2016-03-16 DIAGNOSIS — D649 Anemia, unspecified: Secondary | ICD-10-CM

## 2016-03-16 DIAGNOSIS — F4321 Adjustment disorder with depressed mood: Secondary | ICD-10-CM

## 2016-03-16 MED ORDER — UMECLIDINIUM BROMIDE 62.5 MCG/INH IN AEPB: 1.0000 | INHALATION_SPRAY | Freq: Every day | RESPIRATORY_TRACT | 11 refills | Status: DC

## 2016-03-16 MED ORDER — LORAZEPAM 0.5 MG PO TABS: 0.2500 mg | ORAL_TABLET | Freq: Four times a day (QID) | ORAL | 0 refills | Status: DC | PRN

## 2016-03-16 NOTE — Progress Notes (Signed)
   HPI  Patient presents today for hospital follow-up.  Patient was admitted to the hospital in [place] while visiting her mother who is currently on hospice care. She was treated for COPD exacerbation with IV antibiotics and steroids. She states that she is breathing much better since getting home.  She has good medication compliance with Symbicort. Albuterol as helpful and she using it twice daily.  Patient is tolerating food and fluids normally   Patient also requests refill of Ativan. She still going through the grieving process with her mother being near death.  PMH: Smoking status noted ROS: Per HPI  Objective: BP (!) 148/86   Pulse 95   Temp 97.9 F (36.6 C) (Oral)   Ht _0  (1.6 m)   Wt 107 lb 3.2 oz (48.6 kg)   BMI 18.99 kg/m  Gen: NAD, alert, cooperative with exam HEENT: NCAT CV: RRR, good S1/S2, no murmur Resp: CTABL, no wheezes, non-labored Ext: No edema, warm Neuro: Alert and oriented, No gross deficits  Assessment and plan:  COPD Improved after exacerbation Added incruse today, samples given.  Continue Symbicort  # Grief reaction Refilled Ativan 20 pills Discussed that this should be a short-term medication, also increased risk with reduction in respiratory drive and high-dose chronic narcotic use  # Hypokalemia Repeating labs 3.4 prior to discharge  # Normocytic anemia Repeat CBC    Orders Placed This Encounter  Procedures  . BMP8+EGFR  . CBC with Differential/Platelet    Meds ordered this encounter  Medications  . benzonatate (TESSALON) 100 MG capsule  . umeclidinium bromide (INCRUSE ELLIPTA) 62.5 MCG/INH AEPB    Sig: Inhale 1 puff into the lungs daily.    Dispense:  30 each    Refill:  11  . LORazepam (ATIVAN) 0.5 MG tablet    Sig: Take 0.5-1 tablets (0.25-0.5 mg total) by mouth every 6 (six) hours as needed for anxiety.    Dispense:  20 tablet    Refill:  0    Laroy Apple, MD [facility] 03/16/2016,  5:38 PM

## 2016-03-16 NOTE — Patient Instructions (Signed)
Great to see you!  Try the inhaler 1 puff once daily, give it a week or two to take full effect.   Come back in 2-3 month sunless you need [place] sooner.

## 2016-03-17 LAB — BMP8+EGFR
BUN / CREAT RATIO: 12 (ref 12–28)
BUN: 6 mg/dL — AB (ref 8–27)
CO2: 23 mmol/L (ref 18–29)
CREATININE: 0.51 mg/dL — AB (ref 0.57–1.00)
Calcium: 8.8 mg/dL (ref 8.7–10.3)
Chloride: 97 mmol/L (ref 96–106)
GFR calc Af Amer: 119 mL/min/{1.73_m2} (ref >59)
GFR calc non Af Amer: 103 mL/min/{1.73_m2} (ref >59)
GLUCOSE: 111 mg/dL — AB (ref 65–99)
Potassium: 4 mmol/L (ref 3.5–5.2)
Sodium: 142 mmol/L (ref 134–144)

## 2016-03-17 LAB — CBC WITH DIFFERENTIAL/PLATELET
Basophils Absolute: 0.1 10*3/uL (ref 0.0–0.2)
Basos: 1 %
EOS (ABSOLUTE): 0.2 10*3/uL (ref 0.0–0.4)
EOS: 3 %
HEMOGLOBIN: 10.6 g/dL — AB (ref 11.1–15.9)
Hematocrit: 32.9 % — ABNORMAL LOW (ref 34.0–46.6)
IMMATURE GRANULOCYTES: 0 %
Immature Grans (Abs): 0 10*3/uL (ref 0.0–0.1)
LYMPHS ABS: 2.1 10*3/uL (ref 0.7–3.1)
Lymphs: 31 %
MCH: 27.5 pg (ref 26.6–33.0)
MCHC: 32.2 g/dL (ref 31.5–35.7)
MCV: 85 fL (ref 79–97)
MONOCYTES: 11 %
MONOS ABS: 0.7 10*3/uL (ref 0.1–0.9)
NEUTROS PCT: 54 %
Neutrophils Absolute: 3.6 10*3/uL (ref 1.4–7.0)
Platelets: 458 10*3/uL — ABNORMAL HIGH (ref 150–379)
RBC: 3.86 x10E6/uL (ref 3.77–5.28)
RDW: 16.1 % — AB (ref 12.3–15.4)
WBC: 6.7 10*3/uL (ref 3.4–10.8)

## 2016-04-05 ENCOUNTER — Ambulatory Visit: Admitting: Family Medicine

## 2016-04-06 ENCOUNTER — Encounter: Payer: Self-pay | Admitting: Family Medicine

## 2016-04-06 ENCOUNTER — Ambulatory Visit (INDEPENDENT_AMBULATORY_CARE_PROVIDER_SITE_OTHER): Admitting: Family Medicine

## 2016-04-06 VITALS — BP 139/69 | HR 100 | Temp 97.5°F | Ht 63.0 in | Wt 106.6 lb

## 2016-04-06 DIAGNOSIS — D649 Anemia, unspecified: Secondary | ICD-10-CM

## 2016-04-06 DIAGNOSIS — I10 Essential (primary) hypertension: Secondary | ICD-10-CM

## 2016-04-06 DIAGNOSIS — J449 Chronic obstructive pulmonary disease, unspecified: Secondary | ICD-10-CM

## 2016-04-06 NOTE — Progress Notes (Signed)
   HPI  Patient presents today here for follow-up hypertension, COPD and anemia.  Hypertension Patient doing well on medications, no headaches, chest pain, leg swelling.  COPD Continues to improve from her hospitalization over Christmas. Taking Mucinex with good expectorant effect.  Anemia Reports chronic fatigue and "always having anemia without good explanation". States she can't tolerate iron.  PMH: Smoking status noted ROS: Per HPI  Objective: BP 139/69   Pulse 100   Temp 97.5 F (36.4 C) (Oral)   Ht '5\' 3"'$  (1.6 m)   Wt 106 lb 9.6 oz (48.4 kg)   BMI 18.88 kg/m  Gen: NAD, alert, cooperative with exam HEENT: NCAT CV: RRR, good S1/S2, no murmur Resp: CTABL, no wheezes, non-labored Ext: No edema, warm Neuro: Alert and oriented, No gross deficits MSK: Back brace in place  Assessment and plan:  # Normocytic anemia Patient previously with low ferritin, however states she can't tolerate oral iron replacement, also with normal cytosis I'm not convinced this is only iron deficiency With elevated platelets I'm considering hematology referral if her labs are not improving  # Hypertension Well-controlled on current medicines no change   # COPD Stable Continue Symbicort, incruse, albuterol      Meds ordered this encounter  Medications  . guaiFENesin (MUCINEX) 600 MG 12 hr tablet    Sig: Take by mouth 2 (two) times daily.    Laroy Apple, MD [facility] 04/06/2016, 2:29 PM

## 2016-04-06 NOTE — Patient Instructions (Signed)
Great to see you!  Come back in 4 months unless you need [place] sooner.   We will call with labs and consider a referral to hematology within 1 week

## 2016-04-07 ENCOUNTER — Other Ambulatory Visit: Payer: Self-pay | Admitting: Family Medicine

## 2016-04-07 DIAGNOSIS — D649 Anemia, unspecified: Secondary | ICD-10-CM

## 2016-04-07 LAB — CBC WITH DIFFERENTIAL/PLATELET
BASOS ABS: 0.1 10*3/uL (ref 0.0–0.2)
Basos: 1 %
EOS (ABSOLUTE): 0 10*3/uL (ref 0.0–0.4)
Eos: 1 %
Hematocrit: 34.8 % (ref 34.0–46.6)
Hemoglobin: 10.7 g/dL — ABNORMAL LOW (ref 11.1–15.9)
Immature Grans (Abs): 0 10*3/uL (ref 0.0–0.1)
Immature Granulocytes: 0 %
LYMPHS ABS: 2.2 10*3/uL (ref 0.7–3.1)
Lymphs: 35 %
MCH: 26.2 pg — AB (ref 26.6–33.0)
MCHC: 30.7 g/dL — AB (ref 31.5–35.7)
MCV: 85 fL (ref 79–97)
MONOS ABS: 0.7 10*3/uL (ref 0.1–0.9)
Monocytes: 10 %
NEUTROS ABS: 3.3 10*3/uL (ref 1.4–7.0)
Neutrophils: 53 %
PLATELETS: 374 10*3/uL (ref 150–379)
RBC: 4.08 x10E6/uL (ref 3.77–5.28)
RDW: 16.3 % — AB (ref 12.3–15.4)
WBC: 6.2 10*3/uL (ref 3.4–10.8)

## 2016-04-07 LAB — VITAMIN B12

## 2016-04-07 LAB — FOLATE: Folate: 19.8 ng/mL (ref >3.0)

## 2016-04-07 LAB — FERRITIN: FERRITIN: 21 ng/mL (ref 15–150)

## 2016-04-25 ENCOUNTER — Encounter (HOSPITAL_COMMUNITY): Payer: Self-pay | Admitting: Emergency Medicine

## 2016-04-25 ENCOUNTER — Telehealth: Payer: Self-pay | Admitting: Family Medicine

## 2016-04-25 ENCOUNTER — Emergency Department (HOSPITAL_COMMUNITY)
Admission: EM | Admit: 2016-04-25 | Discharge: 2016-04-25 | Disposition: A | Discharge status: Home / Self Care | Attending: Emergency Medicine | Admitting: Emergency Medicine

## 2016-04-25 DIAGNOSIS — Y929 Unspecified place or not applicable: Secondary | ICD-10-CM | POA: Insufficient documentation

## 2016-04-25 DIAGNOSIS — J45909 Unspecified asthma, uncomplicated: Secondary | ICD-10-CM | POA: Insufficient documentation

## 2016-04-25 DIAGNOSIS — Y93F2 Activity, caregiving, lifting: Secondary | ICD-10-CM | POA: Insufficient documentation

## 2016-04-25 DIAGNOSIS — F17210 Nicotine dependence, cigarettes, uncomplicated: Secondary | ICD-10-CM | POA: Insufficient documentation

## 2016-04-25 DIAGNOSIS — I10 Essential (primary) hypertension: Secondary | ICD-10-CM | POA: Diagnosis not present

## 2016-04-25 DIAGNOSIS — M62830 Muscle spasm of back: Secondary | ICD-10-CM

## 2016-04-25 DIAGNOSIS — S3992XA Unspecified injury of lower back, initial encounter: Secondary | ICD-10-CM | POA: Diagnosis present

## 2016-04-25 DIAGNOSIS — M5441 Lumbago with sciatica, right side: Secondary | ICD-10-CM | POA: Diagnosis not present

## 2016-04-25 DIAGNOSIS — X500XXA Overexertion from strenuous movement or load, initial encounter: Secondary | ICD-10-CM | POA: Insufficient documentation

## 2016-04-25 DIAGNOSIS — Y999 Unspecified external cause status: Secondary | ICD-10-CM | POA: Insufficient documentation

## 2016-04-25 DIAGNOSIS — Z79899 Other long term (current) drug therapy: Secondary | ICD-10-CM | POA: Diagnosis not present

## 2016-04-25 DIAGNOSIS — J449 Chronic obstructive pulmonary disease, unspecified: Secondary | ICD-10-CM | POA: Insufficient documentation

## 2016-04-25 DIAGNOSIS — M5431 Sciatica, right side: Secondary | ICD-10-CM

## 2016-04-25 MED ORDER — MORPHINE SULFATE (PF) 4 MG/ML IV SOLN
8.0000 mg | Freq: Once | INTRAVENOUS | Status: AC
  Administered 2016-04-25: 8 mg via INTRAMUSCULAR
  Filled 2016-04-25: qty 2

## 2016-04-25 MED ORDER — PREDNISONE 20 MG PO TABS: ORAL_TABLET | ORAL | 0 refills | Status: DC

## 2016-04-25 MED ORDER — DIAZEPAM 5 MG PO TABS
10.0000 mg | ORAL_TABLET | Freq: Once | ORAL | Status: AC
  Administered 2016-04-25: 10 mg via ORAL
  Filled 2016-04-25: qty 2

## 2016-04-25 MED ORDER — DEXAMETHASONE SODIUM PHOSPHATE 4 MG/ML IJ SOLN
8.0000 mg | Freq: Once | INTRAMUSCULAR | Status: AC
Start: 2016-04-25 — End: 2016-04-25
  Administered 2016-04-25: 8 mg via INTRAMUSCULAR
  Filled 2016-04-25: qty 2

## 2016-04-25 NOTE — Telephone Encounter (Signed)
No answer jkp 2/20

## 2016-04-25 NOTE — Telephone Encounter (Signed)
Short course of prednisone sent  Laroy Apple, MD [facility] 04/25/2016, 2:06 PM

## 2016-04-25 NOTE — Discharge Instructions (Addendum)
Please use a heating pad to the back. Continue your current medications. See your MD, and or your surgeon for additional evaluation and management of your back pain tomorrow or as soon as possible.. Your were treated with IM narcotic pain medication and muscle relaxer, use caution getting around tonight.

## 2016-04-25 NOTE — ED Triage Notes (Signed)
Patient complains of sciatic pain on the right side that radiate down to right foot. Patient states chronic sciatic pain.

## 2016-04-25 NOTE — ED Provider Notes (Signed)
[facility] DEPT Provider Note   CSN: [id number] Arrival date & time: 04/25/16  1445     History   Chief Complaint Chief Complaint  Patient presents with  . Back Pain    HPI Jasmine [name]. Houchin is a 61 y.o. female.  The history is provided by the patient.  Back Pain   Chronicity: acute on chronic  The current episode started more than 2 days ago. The problem occurs daily. The problem has been gradually worsening. The pain is associated with lifting heavy objects. The pain is present in the lumbar spine. The quality of the pain is described as stabbing. The pain radiates to the right thigh. The pain is moderate. The symptoms are aggravated by certain positions. The pain is the same all the time. Pertinent negatives include no fever, no bowel incontinence, no perianal numbness, no bladder incontinence and no weakness. She has tried muscle relaxants and heat for the symptoms.    Past Medical History:  Diagnosis Date  . Allergy   . Anemia   . Anxiety   . Arthritis   . Asthma   . Carpal tunnel syndrome    bilateral  . Complication of anesthesia    in the past has had N/V, the last few surgeries she has not been  . COPD (chronic obstructive pulmonary disease) ([place])   . GERD (gastroesophageal reflux disease)   . Headache   . Heart murmur 2005   never had any problems  . Hypertension   . Hypoglycemia   . Hypoglycemia   . Kidney stones   . Neuromuscular disorder ([place])    pt denies  . Neuropathy ([facility])    both arms  . Pneumonia   . Seizures ([place])    as a child when she would have an asthma attack. none as an adult  . Shortness of breath dyspnea     Patient Active Problem List   Diagnosis Date Noted  . Postoperative anemia due to acute blood loss 11/15/2015  . Lumbar stenosis 11/11/2015  . Leg pain 09/24/2015  . Healthcare maintenance 09/02/2015  . Normocytic anemia 07/29/2015  . Vitamin D deficiency 07/29/2015  . Chronic back pain 02/22/2015  . HTN (hypertension)  02/22/2015  . Chronic narcotic use 02/22/2015  . COPD (chronic obstructive pulmonary disease) ([place]) 02/22/2015  . Headache 02/22/2015    Past Surgical History:  Procedure Laterality Date  . ABDOMINAL HYSTERECTOMY  2005  . [place]  . disk repair     neck and lumbar region  . FOOT SURGERY Bilateral    to file bones down   . IMPLANTATION / PLACEMENT EPIDURAL NEUROSTIMULATOR ELECTRODES    . TONSILLECTOMY      OB History    No data available       Home Medications    Prior to Admission medications   Medication Sig Start Date End Date Taking? Authorizing Provider  albuterol (PROVENTIL HFA;VENTOLIN HFA) 108 (90 Base) MCG/ACT inhaler Inhale 2 puffs into the lungs every 6 (six) hours as needed for wheezing or shortness of breath. 07/29/15  Yes Timmothy Euler, MD  amLODipine (NORVASC) 10 MG tablet Take 1 tablet (10 mg total) by mouth daily. 01/03/16  Yes Timmothy Euler, MD  Aspirin-Salicylamide-Caffeine (BC HEADACHE POWDER PO) Take 1 Package by mouth daily as needed (headaches).    Yes Historical Provider, MD  budesonide-formoterol (SYMBICORT) 160-4.5 MCG/ACT inhaler Inhale 2 puffs into the lungs 2 (two) times daily. 09/02/15  Yes Timmothy Euler, MD  cetirizine (ZYRTEC) 10 MG tablet Take 10 mg by mouth daily as needed for allergies. Reported on 03/12/2015   Yes Historical Provider, MD  DULoxetine (CYMBALTA) 30 MG capsule Take 3 capsules (90 mg total) by mouth daily. Patient taking differently: Take 30 mg by mouth 3 (three) times daily.  07/29/15  Yes Timmothy Euler, MD  guaiFENesin (MUCINEX) 600 MG 12 hr tablet Take 600 mg by mouth 2 (two) times daily.    Yes Historical Provider, MD  HYDROcodone Bitartrate ER (HYSINGLA ER) 60 MG T24A Take 60 mg by mouth daily.    Yes Historical Provider, MD  HYDROcodone-acetaminophen (NORCO) 10-325 MG tablet Take 1 tablet by mouth 3 (three) times daily.   Yes Historical Provider, MD  ipratropium-albuterol (DUONEB) 0.5-2.5 (3) MG/3ML  SOLN Inhale 3 mLs into the lungs 2 (two) times daily. *May use as needed 02/21/16  Yes Historical Provider, MD  l-methylfolate-B6-B12 (METANX) 3-35-2 MG TABS tablet Take 1 tablet by mouth 2 (two) times daily. 12/13/15  Yes Timmothy Euler, MD  pantoprazole (PROTONIX) 40 MG tablet Take 1 tablet (40 mg total) by mouth daily. 07/29/15  Yes Timmothy Euler, MD  tizanidine (ZANAFLEX) 6 MG capsule Take 6 mg by mouth 3 (three) times daily.   Yes Historical Provider, MD  traZODone (DESYREL) 100 MG tablet Take 1 tablet (100 mg total) by mouth at bedtime. 01/19/16  Yes Terald Sleeper, PA-C  umeclidinium bromide (INCRUSE ELLIPTA) 62.5 MCG/INH AEPB Inhale 1 puff into the lungs daily. 03/16/16  Yes Timmothy Euler, MD  predniSONE (DELTASONE) 20 MG tablet 2 po at same time daily for 5 days Patient not taking: Reported on 04/25/2016 04/25/16   Timmothy Euler, MD    Family History Family History  Problem Relation Age of Onset  . Heart disease Mother   . Diabetes Mother   . Hypertension Mother   . Hyperlipidemia Mother   . COPD Mother   . COPD Father   . Diabetes Father   . Heart disease Father   . Cancer Father     throat  . Dementia Father   . Asthma Son     Social History Social History  Substance Use Topics  . Smoking status: Current Every Day Smoker    Packs/day: 0.50    Types: Cigarettes  . Smokeless tobacco: Never Used     Comment: trying to quit  . Alcohol use No     Allergies   Darvon [propoxyphene]; Erythromycin; Vibramycin [doxycycline calcium]; and Augmentin [amoxicillin-pot clavulanate]   Review of Systems Review of Systems  Constitutional: Negative for fever.  Gastrointestinal: Negative for bowel incontinence.  Genitourinary: Negative for bladder incontinence.  Musculoskeletal: Positive for back pain.  Neurological: Negative for weakness.  Psychiatric/Behavioral: The patient is nervous/anxious.   All other systems reviewed and are negative.    Physical  Exam Updated Vital Signs Temp 98.5 F (36.9 C) (Oral)   Resp 18   Ht '5\' 3"'$  (1.6 m)   Wt 48.1 kg   SpO2 100%   BMI 18.78 kg/m   Physical Exam  Constitutional: She appears well-developed and well-nourished. No distress.  HENT:  Head: Normocephalic and atraumatic.  Right Ear: External ear normal.  Left Ear: External ear normal.  Eyes: Conjunctivae are normal. Right eye exhibits no discharge. Left eye exhibits no discharge. No scleral icterus.  Neck: Neck supple. No tracheal deviation present.  Cardiovascular: Normal rate, regular rhythm and intact distal pulses.   Pulmonary/Chest: Effort normal and breath sounds normal.  No stridor. No respiratory distress. She has no wheezes. She has no rales.  Abdominal: Soft. Bowel sounds are normal. She exhibits no distension. There is no tenderness. There is no rebound and no guarding.  Musculoskeletal: She exhibits no edema or tenderness.  Pain with attempted straight leg raise on the right. No hot areas of the lumbar spine. Some paraspinal tenderness and spasm, right greater than left.  Neurological: She is alert. She has normal strength. No cranial nerve deficit (no facial droop, extraocular movements intact, no slurred speech). She displays no seizure activity. Coordination normal.  Some decrease in sensation of the the right lower extremity. There is foot drop noted of the right lower extremity. This is not new.  Pt able to walk, but with brace on the right lower extremity and with a limp, also not new.  Skin: Skin is warm and dry. No rash noted.  Psychiatric: She has a normal mood and affect.  Nursing note and vitals reviewed.    ED Treatments / Results  Labs (all labs ordered are listed, but only abnormal results are displayed) Labs Reviewed - No data to display  EKG  EKG Interpretation None       Radiology No results found.  Procedures Procedures (including critical care time)  Medications Ordered in ED Medications - No  data to display   Initial Impression / Assessment and Plan / ED Course  I have reviewed the triage vital signs and the nursing notes.  Pertinent labs & imaging results that were available during my care of the patient were reviewed by me and considered in my medical decision making (see chart for details).     **I have reviewed nursing notes, vital signs, and all appropriate lab and imaging results for this patient.*  Final Clinical Impressions(s) / ED Diagnoses MDM Pt has hx of sciatica and DDD. She has had 2 extensive surgeries on her back. No new gross neuro deficit noted. Suspected she aggravated previous back issues while assisting with moving. Pt treated in the ED with IM morphine and Decadron. Pt to see her surgeon tomorrow for additional evaluation and management.   Final diagnoses:  None    New Prescriptions New Prescriptions   No medications on file     Lily Kocher, PA-C 04/25/16 [place], MD 04/26/16 1245

## 2016-04-25 NOTE — Telephone Encounter (Signed)
Patient seen here 04/06/16 for followup of chronic problems.  Is having some pain in her right hip and would like for [place] to call in a prednisone dose pack for sciatica.  Please advise.

## 2016-04-25 NOTE — ED Notes (Signed)
Pt with hx of sciatica back pain.  Pt has tried everything at home including her pain meds and muscle relaxer without any relief.

## 2016-04-27 ENCOUNTER — Encounter (HOSPITAL_COMMUNITY): Payer: Self-pay | Admitting: *Deleted

## 2016-04-27 ENCOUNTER — Emergency Department (HOSPITAL_COMMUNITY)
Admission: EM | Admit: 2016-04-27 | Discharge: 2016-04-28 | Disposition: A | Discharge status: Home / Self Care | Attending: Emergency Medicine | Admitting: Emergency Medicine

## 2016-04-27 DIAGNOSIS — J45909 Unspecified asthma, uncomplicated: Secondary | ICD-10-CM | POA: Diagnosis not present

## 2016-04-27 DIAGNOSIS — J449 Chronic obstructive pulmonary disease, unspecified: Secondary | ICD-10-CM | POA: Diagnosis not present

## 2016-04-27 DIAGNOSIS — M5431 Sciatica, right side: Secondary | ICD-10-CM

## 2016-04-27 DIAGNOSIS — M545 Low back pain: Secondary | ICD-10-CM | POA: Diagnosis present

## 2016-04-27 DIAGNOSIS — I10 Essential (primary) hypertension: Secondary | ICD-10-CM | POA: Diagnosis not present

## 2016-04-27 DIAGNOSIS — M5441 Lumbago with sciatica, right side: Secondary | ICD-10-CM | POA: Diagnosis not present

## 2016-04-27 DIAGNOSIS — F17210 Nicotine dependence, cigarettes, uncomplicated: Secondary | ICD-10-CM | POA: Diagnosis not present

## 2016-04-27 DIAGNOSIS — Z79899 Other long term (current) drug therapy: Secondary | ICD-10-CM | POA: Diagnosis not present

## 2016-04-27 MED ORDER — DIAZEPAM 5 MG PO TABS
10.0000 mg | ORAL_TABLET | Freq: Once | ORAL | Status: AC
Start: 2016-04-27 — End: 2016-04-27
  Administered 2016-04-27: 10 mg via ORAL
  Filled 2016-04-27: qty 2

## 2016-04-27 MED ORDER — MORPHINE SULFATE (PF) 4 MG/ML IV SOLN
8.0000 mg | Freq: Once | INTRAVENOUS | Status: AC
  Administered 2016-04-27: 8 mg via INTRAMUSCULAR
  Filled 2016-04-27: qty 2

## 2016-04-27 NOTE — ED Provider Notes (Signed)
[facility] DEPT Provider Note   CSN: [id number] Arrival date & time: 04/27/16  2207     History   Chief Complaint Chief Complaint  Patient presents with  . Back Pain    HPI Jasmine [name] is a 61 y.o. female.  HPI   Jasmine [name] is a 61 y.o. female who presents to the Emergency Department complaining of recurrent right low back pain.  She reports chronic back pain and currently managed by pain management.  Pain has worsened recently after bending over the pick up her dog.  She states her current pain medication is not controlling the pain.  She was seen here 2 days ago for same and states the pain improved briefly.  She has appt with her PCP next week.  She describes a sharp pain radiating from her right low back into the right thigh.  She denies urine or bowel changes, abd pain, numbness or weakness of the lower extremities.      Past Medical History:  Diagnosis Date  . Allergy   . Anemia   . Anxiety   . Arthritis   . Asthma   . Carpal tunnel syndrome    bilateral  . Complication of anesthesia    in the past has had N/V, the last few surgeries she has not been  . COPD (chronic obstructive pulmonary disease) ([place])   . GERD (gastroesophageal reflux disease)   . Headache   . Heart murmur 2005   never had any problems  . Hypertension   . Hypoglycemia   . Hypoglycemia   . Kidney stones   . Neuromuscular disorder ([place])    pt denies  . Neuropathy ([facility])    both arms  . Pneumonia   . Seizures ([place])    as a child when she would have an asthma attack. none as an adult  . Shortness of breath dyspnea     Patient Active Problem List   Diagnosis Date Noted  . Postoperative anemia due to acute blood loss 11/15/2015  . Lumbar stenosis 11/11/2015  . Leg pain 09/24/2015  . Healthcare maintenance 09/02/2015  . Normocytic anemia 07/29/2015  . Vitamin D deficiency 07/29/2015  . Chronic back pain 02/22/2015  . HTN (hypertension) 02/22/2015  . Chronic narcotic use  02/22/2015  . COPD (chronic obstructive pulmonary disease) ([place]) 02/22/2015  . Headache 02/22/2015    Past Surgical History:  Procedure Laterality Date  . ABDOMINAL HYSTERECTOMY  2005  . [place]  . disk repair     neck and lumbar region  . FOOT SURGERY Bilateral    to file bones down   . IMPLANTATION / PLACEMENT EPIDURAL NEUROSTIMULATOR ELECTRODES    . TONSILLECTOMY      OB History    No data available       Home Medications    Prior to Admission medications   Medication Sig Start Date End Date Taking? Authorizing Provider  albuterol (PROVENTIL HFA;VENTOLIN HFA) 108 (90 Base) MCG/ACT inhaler Inhale 2 puffs into the lungs every 6 (six) hours as needed for wheezing or shortness of breath. 07/29/15   Timmothy Euler, MD  amLODipine (NORVASC) 10 MG tablet Take 1 tablet (10 mg total) by mouth daily. 01/03/16   Timmothy Euler, MD  Aspirin-Salicylamide-Caffeine (BC HEADACHE POWDER PO) Take 1 Package by mouth daily as needed (headaches).     Historical Provider, MD  budesonide-formoterol (SYMBICORT) 160-4.5 MCG/ACT inhaler Inhale 2 puffs into the lungs 2 (two) times daily. 09/02/15  Timmothy Euler, MD  cetirizine (ZYRTEC) 10 MG tablet Take 10 mg by mouth daily as needed for allergies. Reported on 03/12/2015    Historical Provider, MD  DULoxetine (CYMBALTA) 30 MG capsule Take 3 capsules (90 mg total) by mouth daily. Patient taking differently: Take 30 mg by mouth 3 (three) times daily.  07/29/15   Timmothy Euler, MD  guaiFENesin (MUCINEX) 600 MG 12 hr tablet Take 600 mg by mouth 2 (two) times daily.     Historical Provider, MD  HYDROcodone Bitartrate ER (HYSINGLA ER) 60 MG T24A Take 60 mg by mouth daily.     Historical Provider, MD  HYDROcodone-acetaminophen (NORCO) 10-325 MG tablet Take 1 tablet by mouth 3 (three) times daily.    Historical Provider, MD  ipratropium-albuterol (DUONEB) 0.5-2.5 (3) MG/3ML SOLN Inhale 3 mLs into the lungs 2 (two) times daily. *May use  as needed 02/21/16   Historical Provider, MD  l-methylfolate-B6-B12 (METANX) 3-35-2 MG TABS tablet Take 1 tablet by mouth 2 (two) times daily. 12/13/15   Timmothy Euler, MD  pantoprazole (PROTONIX) 40 MG tablet Take 1 tablet (40 mg total) by mouth daily. 07/29/15   Timmothy Euler, MD  predniSONE (DELTASONE) 20 MG tablet 2 po at same time daily for 5 days Patient not taking: Reported on 04/25/2016 04/25/16   Timmothy Euler, MD  tizanidine (ZANAFLEX) 6 MG capsule Take 6 mg by mouth 3 (three) times daily.    Historical Provider, MD  traZODone (DESYREL) 100 MG tablet Take 1 tablet (100 mg total) by mouth at bedtime. 01/19/16   Terald Sleeper, PA-C  umeclidinium bromide (INCRUSE ELLIPTA) 62.5 MCG/INH AEPB Inhale 1 puff into the lungs daily. 03/16/16   Timmothy Euler, MD    Family History Family History  Problem Relation Age of Onset  . Heart disease Mother   . Diabetes Mother   . Hypertension Mother   . Hyperlipidemia Mother   . COPD Mother   . COPD Father   . Diabetes Father   . Heart disease Father   . Cancer Father     throat  . Dementia Father   . Asthma Son     Social History Social History  Substance Use Topics  . Smoking status: Current Every Day Smoker    Packs/day: 0.50    Types: Cigarettes  . Smokeless tobacco: Never Used     Comment: trying to quit  . Alcohol use No     Allergies   Darvon [propoxyphene]; Erythromycin; Vibramycin [doxycycline calcium]; and Augmentin [amoxicillin-pot clavulanate]   Review of Systems Review of Systems  Constitutional: Negative for fever.  Respiratory: Negative for shortness of breath.   Gastrointestinal: Negative for abdominal pain, constipation and vomiting.  Genitourinary: Negative for decreased urine volume, difficulty urinating, dysuria, flank pain and hematuria.  Musculoskeletal: Positive for back pain. Negative for joint swelling.  Skin: Negative for rash.  Neurological: Negative for weakness and numbness.  All other  systems reviewed and are negative.    Physical Exam Updated Vital Signs BP 109/57 (BP Location: Left Arm)   Pulse 95   Temp 98 F (36.7 C) (Oral)   Resp 20   Ht '5\' 3"'$  (1.6 m)   Wt 48.1 kg   SpO2 97%   BMI 18.78 kg/m   Physical Exam  Constitutional: She is oriented to person, place, and time. She appears well-developed and well-nourished. No distress.  HENT:  Head: Normocephalic and atraumatic.  Neck: Normal range of motion. Neck supple.  Cardiovascular: Normal rate, regular rhythm, normal heart sounds and intact distal pulses.   No murmur heard. Pulmonary/Chest: Effort normal and breath sounds normal. No respiratory distress.  Abdominal: Soft. She exhibits no distension. There is no tenderness.  Musculoskeletal: She exhibits tenderness. She exhibits no edema.       Lumbar back: She exhibits tenderness and pain. She exhibits normal range of motion, no swelling, no deformity, no laceration and normal pulse.  ttp of the right lumbar paraspinal muscles and SI joint space.  No spinal tenderness.  DP pulses are brisk and symmetrical.  Distal sensation intact.   Neurological: She is alert and oriented to person, place, and time. She has normal strength. No sensory deficit. She exhibits normal muscle tone. Coordination and gait normal.  Reflex Scores:      Patellar reflexes are 2+ on the right side and 2+ on the left side.      Achilles reflexes are 2+ on the right side and 2+ on the left side. Skin: Skin is warm and dry. No rash noted.  Nursing note and vitals reviewed.    ED Treatments / Results  Labs (all labs ordered are listed, but only abnormal results are displayed) Labs Reviewed - No data to display  EKG  EKG Interpretation None       Radiology No results found.  Procedures Procedures (including critical care time)  Medications Ordered in ED Medications  morphine 4 MG/ML injection 8 mg (not administered)  diazepam (VALIUM) tablet 10 mg (not administered)      Initial Impression / Assessment and Plan / ED Course  I have reviewed the triage vital signs and the nursing notes.  Pertinent labs & imaging results that were available during my care of the patient were reviewed by me and considered in my medical decision making (see chart for details).     Pt with likely acute on chronic low back pain.  Recently seen here for same.  No focal neuor deficits, no concerning sx's for emergent neurological process.  On recheck, pt reports feeling better after medications and she is requesting discharge.  Has upcoming PCP appt.  Appears stable for d/c.  Ambulates from dept would steady gait.   Final Clinical Impressions(s) / ED Diagnoses   Final diagnoses:  Sciatica of right side    New Prescriptions New Prescriptions   No medications on file     Kem Parkinson, PA-C 04/29/16 [place], MD 04/29/16 [phone]

## 2016-04-27 NOTE — Discharge Instructions (Signed)
You can alternate ice and heat to your back.  Follow-up with your doctor next week

## 2016-04-27 NOTE — ED Triage Notes (Signed)
Pt c/o lower back pain and right leg pain for the last 4 days; pt states the pain meds she takes is not working

## 2016-05-02 ENCOUNTER — Ambulatory Visit: Admitting: Family Medicine

## 2016-05-04 ENCOUNTER — Encounter: Payer: Self-pay | Admitting: Family Medicine

## 2016-05-04 ENCOUNTER — Ambulatory Visit (INDEPENDENT_AMBULATORY_CARE_PROVIDER_SITE_OTHER): Admitting: Family Medicine

## 2016-05-04 VITALS — BP 139/72 | HR 90 | Temp 97.9°F | Ht 63.0 in | Wt 104.4 lb

## 2016-05-04 DIAGNOSIS — M5441 Lumbago with sciatica, right side: Secondary | ICD-10-CM | POA: Diagnosis not present

## 2016-05-04 DIAGNOSIS — I10 Essential (primary) hypertension: Secondary | ICD-10-CM | POA: Diagnosis not present

## 2016-05-04 DIAGNOSIS — G8929 Other chronic pain: Secondary | ICD-10-CM | POA: Diagnosis not present

## 2016-05-04 DIAGNOSIS — J449 Chronic obstructive pulmonary disease, unspecified: Secondary | ICD-10-CM

## 2016-05-04 MED ORDER — TRAZODONE HCL 100 MG PO TABS: 100.0000 mg | ORAL_TABLET | Freq: Every day | ORAL | 3 refills | Status: DC

## 2016-05-04 NOTE — Patient Instructions (Signed)
Great to see you!  Change the prednisone dosing:  20 mg for 4 days, then 10 (1/2 tab) for 4 days then stop.

## 2016-05-04 NOTE — Progress Notes (Signed)
   HPI  Patient presents today for follow-up for chronic medical conditions as well as back pain. Patient has chronic low back pain. She's been seen pain management in [place] as well as neurosurgery in [place].  She has had a flare recently a right-sided low back pain with sciatica, prednisone has been helpful, she's had 2 emergency room trips each time treated with Valium plus morphine.  She has restarted another prednisone course 2 days ago, she had been finished for about one week. Nicely leg weakness, however she has numbness of the right lower extremity which is stable from previous.  COPD Still smoking, incruse is helping Continues symbicort  Hypertension Good medication compliance, has been controlled throughout the event.   PMH: Smoking status noted ROS: Per HPI  Objective: BP 139/72   Pulse 90   Temp 97.9 F (36.6 C) (Oral)   Ht '5\' 3"'$  (1.6 m)   Wt 104 lb 6.4 oz (47.4 kg)   BMI 18.49 kg/m  Gen: NAD, alert, cooperative with exam HEENT: NCAT CV: RRR, good S1/S2, no murmur Resp: CTABL, no wheezes, non-labored Ext: No edema, warm Neuro: Alert and oriented, No gross deficits  MSK:  Severe tenderness on palp of R sided low back paraspinal muscles, no midline bony tenderness to palp   Assessment and plan:  # acute on chronic back pain with R sided sciatica Pt has followed up with CPS a few days ago, was out of pain meds for a few days Taper prednisone' See neurosurg with sudden worsening  # Hypertension Well-controlled on amlodipine, no changes Continue  # COPD Improving with Symbicort plus Incruse, no changes Discussed smoking cessation    Meds ordered this encounter  Medications  . traZODone (DESYREL) 100 MG tablet    Sig: Take 1 tablet (100 mg total) by mouth at bedtime.    Dispense:  90 tablet    Refill:  [place], MD [place] 05/04/2016, 11:58 AM

## 2016-05-09 ENCOUNTER — Ambulatory Visit: Payer: Self-pay | Admitting: Family Medicine

## 2016-05-11 ENCOUNTER — Encounter: Payer: Self-pay | Admitting: Family Medicine

## 2016-05-23 ENCOUNTER — Ambulatory Visit (HOSPITAL_COMMUNITY)

## 2016-05-23 ENCOUNTER — Ambulatory Visit (HOSPITAL_COMMUNITY): Admitting: Oncology

## 2016-05-31 ENCOUNTER — Encounter: Admitting: *Deleted

## 2016-06-22 ENCOUNTER — Other Ambulatory Visit: Payer: Self-pay | Admitting: *Deleted

## 2016-06-22 ENCOUNTER — Encounter: Payer: Self-pay | Admitting: Family Medicine

## 2016-06-22 ENCOUNTER — Telehealth: Payer: Self-pay | Admitting: Family Medicine

## 2016-06-22 MED ORDER — TIOTROPIUM BROMIDE MONOHYDRATE 18 MCG IN CAPS: 18.0000 ug | ORAL_CAPSULE | Freq: Every day | RESPIRATORY_TRACT | 12 refills | Status: DC

## 2016-06-22 MED ORDER — ALBUTEROL SULFATE HFA 108 (90 BASE) MCG/ACT IN AERS: 2.0000 | INHALATION_SPRAY | Freq: Four times a day (QID) | RESPIRATORY_TRACT | 2 refills | Status: DC | PRN

## 2016-06-22 NOTE — Telephone Encounter (Signed)
spiriva sent.  Usually one of the 2 are covered by insurance.    Laroy Apple, MD [facility] 06/22/2016, 10:28 AM

## 2016-06-22 NOTE — Telephone Encounter (Signed)
Husband aware medication has been sent to [facility] in [place]

## 2016-06-23 ENCOUNTER — Other Ambulatory Visit: Payer: Self-pay | Admitting: Neurosurgery

## 2016-06-26 ENCOUNTER — Ambulatory Visit (HOSPITAL_COMMUNITY): Admitting: Oncology

## 2016-06-29 ENCOUNTER — Encounter (HOSPITAL_COMMUNITY)
Admission: RE | Admit: 2016-06-29 | Discharge: 2016-06-29 | Disposition: A | Discharge status: Home / Self Care | Source: Ambulatory Visit | Attending: Neurosurgery | Admitting: Neurosurgery

## 2016-06-29 ENCOUNTER — Encounter (HOSPITAL_COMMUNITY): Payer: Self-pay

## 2016-06-29 DIAGNOSIS — D649 Anemia, unspecified: Secondary | ICD-10-CM | POA: Diagnosis not present

## 2016-06-29 DIAGNOSIS — G8929 Other chronic pain: Secondary | ICD-10-CM | POA: Insufficient documentation

## 2016-06-29 DIAGNOSIS — M48061 Spinal stenosis, lumbar region without neurogenic claudication: Secondary | ICD-10-CM | POA: Insufficient documentation

## 2016-06-29 DIAGNOSIS — Z01812 Encounter for preprocedural laboratory examination: Secondary | ICD-10-CM | POA: Diagnosis present

## 2016-06-29 DIAGNOSIS — J449 Chronic obstructive pulmonary disease, unspecified: Secondary | ICD-10-CM | POA: Insufficient documentation

## 2016-06-29 DIAGNOSIS — E559 Vitamin D deficiency, unspecified: Secondary | ICD-10-CM | POA: Diagnosis not present

## 2016-06-29 DIAGNOSIS — M549 Dorsalgia, unspecified: Secondary | ICD-10-CM | POA: Diagnosis not present

## 2016-06-29 DIAGNOSIS — F1190 Opioid use, unspecified, uncomplicated: Secondary | ICD-10-CM | POA: Diagnosis not present

## 2016-06-29 DIAGNOSIS — I10 Essential (primary) hypertension: Secondary | ICD-10-CM | POA: Insufficient documentation

## 2016-06-29 DIAGNOSIS — R51 Headache: Secondary | ICD-10-CM | POA: Diagnosis not present

## 2016-06-29 HISTORY — DX: Personal history of urinary calculi: Z87.442

## 2016-06-29 HISTORY — DX: Nausea with vomiting, unspecified: Z98.890

## 2016-06-29 HISTORY — DX: Sciatica, unspecified side: M54.30

## 2016-06-29 HISTORY — DX: Nausea with vomiting, unspecified: R11.2

## 2016-06-29 LAB — CBC
HEMATOCRIT: 37.3 % (ref 36.0–46.0)
HEMOGLOBIN: 11.2 g/dL — AB (ref 12.0–15.0)
MCH: 24.7 pg — AB (ref 26.0–34.0)
MCHC: 30 g/dL (ref 30.0–36.0)
MCV: 82.2 fL (ref 78.0–100.0)
PLATELETS: 415 10*3/uL — AB (ref 150–400)
RBC: 4.54 MIL/uL (ref 3.87–5.11)
RDW: 18.1 % — ABNORMAL HIGH (ref 11.5–15.5)
WBC: 7.1 10*3/uL (ref 4.0–10.5)

## 2016-06-29 LAB — BASIC METABOLIC PANEL
ANION GAP: 11 (ref 5–15)
BUN: 12 mg/dL (ref 6–20)
CHLORIDE: 101 mmol/L (ref 101–111)
CO2: 24 mmol/L (ref 22–32)
Calcium: 9.1 mg/dL (ref 8.9–10.3)
Creatinine, Ser: 0.66 mg/dL (ref 0.44–1.00)
GFR calc Af Amer: >60 mL/min (ref >60)
GLUCOSE: 92 mg/dL (ref 65–99)
POTASSIUM: 3.9 mmol/L (ref 3.5–5.1)
Sodium: 136 mmol/L (ref 135–145)

## 2016-06-29 LAB — SURGICAL PCR SCREEN
MRSA, PCR: NEGATIVE
Staphylococcus aureus: NEGATIVE

## 2016-06-29 NOTE — Pre-Procedure Instructions (Signed)
CAIDENCE KASEMAN  06/29/2016      [facility] - [place], [place] [address] Phone: [phone] Fax: [phone]  [facility] [address], [place] - [place] [address] [place] [address] [place] [address] Phone: [phone] Fax: [phone]  [facility], [place] [address] [place] [address] Phone: [phone] Fax: [phone]    Your procedure is scheduled on Wednesday May 2.  Report to [facility] Admitting at 9:00 A.M.  Call this number if you have problems the morning of surgery:  [phone]   Remember:  Do not eat food or drink liquids after midnight.  Take these medicines the morning of surgery with A SIP OF WATER: amlodipine (norvasc), pantoprazole (protonix),  citirizine (zyrtec), duloxetine (cymbalta), gabapentin (neurontin) if needed, benzonate (tessalon) if needed, hydrocodone if needed, zanaflex if needed, Spiriva, albuterol if needed (please bring inhaler to hospital with you)  7 days prior to surgery STOP taking any Aspirin, Aleve, Naproxen, Ibuprofen, Motrin, Advil, Goody's, BC's, all herbal medications, fish oil, and all vitamins    Do not wear jewelry, make-up or nail polish.  Do not wear lotions, powders, or perfumes, or deoderant.  Do not shave 48 hours prior to surgery.  Men may shave face and neck.  Do not bring valuables to the hospital.  [facility] is not responsible for any belongings or valuables.  Contacts, dentures or bridgework may not be worn into surgery.  Leave your suitcase in the car.  After surgery it may be brought to your room.  For patients admitted to the hospital, discharge time will be determined by your treatment team.  Patients discharged the day of surgery will not be allowed to drive home.    Special instructions:    [facility]- Preparing For Surgery  Before surgery, you can play  an important role. Because skin is not sterile, your skin needs to be as free of germs as possible. You can reduce the number of germs on your skin by washing with CHG (chlorahexidine gluconate) Soap before surgery.  CHG is an antiseptic cleaner which kills germs and bonds with the skin to continue killing germs even after washing.  Please do not use if you have an allergy to CHG or antibacterial soaps. If your skin becomes reddened/irritated stop using the CHG.  Do not shave (including legs and underarms) for at least 48 hours prior to first CHG shower. It is OK to shave your face.  Please follow these instructions carefully.   1. Shower the NIGHT BEFORE SURGERY and the MORNING OF SURGERY with CHG.   2. If you chose to wash your hair, wash your hair first as usual with your normal shampoo.  3. After you shampoo, rinse your hair and body thoroughly to remove the shampoo.  4. Use CHG as you would any other liquid soap. You can apply CHG directly to the skin and wash gently with a scrungie or a clean washcloth.   5. Apply the CHG Soap to your body ONLY FROM THE NECK DOWN.  Do not use on open wounds or open sores. Avoid contact with your eyes, ears, mouth and genitals (private parts). Wash genitals (private parts) with your normal soap.  6. Wash thoroughly, paying special attention to the area where your surgery will be performed.  7. Thoroughly rinse your body with warm water from the  neck down.  8. DO NOT shower/wash with your normal soap after using and rinsing off the CHG Soap.  9. Pat yourself dry with a CLEAN TOWEL.   10. Wear CLEAN PAJAMAS   11. Place CLEAN SHEETS on your bed the night of your first shower and DO NOT SLEEP WITH PETS.    Day of Surgery: Do not apply any deodorants/lotions. Please wear clean clothes to the hospital/surgery center.      Please read over the following fact sheets that you were given. MRSA Information

## 2016-06-29 NOTE — Progress Notes (Signed)
PCp: Kenn File  Pt denies cardiac hx or cardiac symptoms/workup, no cardiologist  EKG: 11/2016  No chest pain, SOB or signs of infection at PAT appointment.  Pt states she does have a hematology appointment d/t anemia scheduled for after surgery.   Chart forwarded to anesthesia for review of EKG.

## 2016-07-05 ENCOUNTER — Ambulatory Visit (HOSPITAL_COMMUNITY)
Admission: RE | Admit: 2016-07-05 | Discharge: 2016-07-05 | Disposition: A | Discharge status: Home / Self Care | Source: Ambulatory Visit | Attending: Neurosurgery | Admitting: Neurosurgery

## 2016-07-05 ENCOUNTER — Ambulatory Visit (HOSPITAL_COMMUNITY)

## 2016-07-05 ENCOUNTER — Encounter (HOSPITAL_COMMUNITY): Payer: Self-pay | Admitting: Urology

## 2016-07-05 ENCOUNTER — Ambulatory Visit (HOSPITAL_COMMUNITY): Admitting: Emergency Medicine

## 2016-07-05 ENCOUNTER — Ambulatory Visit (HOSPITAL_COMMUNITY): Admitting: Anesthesiology

## 2016-07-05 ENCOUNTER — Encounter (HOSPITAL_COMMUNITY)
Admission: RE | Disposition: A | Discharge status: Home / Self Care | Payer: Self-pay | Source: Ambulatory Visit | Attending: Neurosurgery

## 2016-07-05 DIAGNOSIS — Z79899 Other long term (current) drug therapy: Secondary | ICD-10-CM | POA: Insufficient documentation

## 2016-07-05 DIAGNOSIS — Y752 Prosthetic and other implants, materials and neurological devices associated with adverse incidents: Secondary | ICD-10-CM | POA: Diagnosis not present

## 2016-07-05 DIAGNOSIS — I10 Essential (primary) hypertension: Secondary | ICD-10-CM | POA: Diagnosis not present

## 2016-07-05 DIAGNOSIS — F17210 Nicotine dependence, cigarettes, uncomplicated: Secondary | ICD-10-CM | POA: Insufficient documentation

## 2016-07-05 DIAGNOSIS — M545 Low back pain: Secondary | ICD-10-CM | POA: Insufficient documentation

## 2016-07-05 DIAGNOSIS — Z7982 Long term (current) use of aspirin: Secondary | ICD-10-CM | POA: Diagnosis not present

## 2016-07-05 DIAGNOSIS — J449 Chronic obstructive pulmonary disease, unspecified: Secondary | ICD-10-CM | POA: Diagnosis not present

## 2016-07-05 DIAGNOSIS — G8929 Other chronic pain: Secondary | ICD-10-CM | POA: Insufficient documentation

## 2016-07-05 DIAGNOSIS — G629 Polyneuropathy, unspecified: Secondary | ICD-10-CM | POA: Insufficient documentation

## 2016-07-05 DIAGNOSIS — F419 Anxiety disorder, unspecified: Secondary | ICD-10-CM | POA: Diagnosis not present

## 2016-07-05 DIAGNOSIS — Z79891 Long term (current) use of opiate analgesic: Secondary | ICD-10-CM | POA: Diagnosis not present

## 2016-07-05 DIAGNOSIS — M1990 Unspecified osteoarthritis, unspecified site: Secondary | ICD-10-CM | POA: Insufficient documentation

## 2016-07-05 DIAGNOSIS — K219 Gastro-esophageal reflux disease without esophagitis: Secondary | ICD-10-CM | POA: Diagnosis not present

## 2016-07-05 DIAGNOSIS — Z880 Allergy status to penicillin: Secondary | ICD-10-CM | POA: Diagnosis not present

## 2016-07-05 DIAGNOSIS — T85113A Breakdown (mechanical) of implanted electronic neurostimulator, generator, initial encounter: Secondary | ICD-10-CM | POA: Insufficient documentation

## 2016-07-05 DIAGNOSIS — Z7951 Long term (current) use of inhaled steroids: Secondary | ICD-10-CM | POA: Diagnosis not present

## 2016-07-05 HISTORY — PX: SPINAL CORD STIMULATOR INSERTION: SHX5378

## 2016-07-05 SURGERY — INSERTION, SPINAL CORD STIMULATOR, LUMBAR
Anesthesia: General

## 2016-07-05 MED ORDER — ACETAMINOPHEN 650 MG RE SUPP: 650.0000 mg | RECTAL | Status: DC | PRN

## 2016-07-05 MED ORDER — BUPIVACAINE-EPINEPHRINE (PF) 0.5% -1:200000 IJ SOLN
INTRAMUSCULAR | Status: AC
  Filled 2016-07-05: qty 30

## 2016-07-05 MED ORDER — ACETAMINOPHEN 325 MG PO TABS: 650.0000 mg | ORAL_TABLET | ORAL | Status: DC | PRN

## 2016-07-05 MED ORDER — VANCOMYCIN HCL IN DEXTROSE 1-5 GM/200ML-% IV SOLN
1000.0000 mg | INTRAVENOUS | Status: AC
  Administered 2016-07-05: 1000 mg via INTRAVENOUS
  Filled 2016-07-05: qty 200

## 2016-07-05 MED ORDER — ARTIFICIAL TEARS OPHTHALMIC OINT
TOPICAL_OINTMENT | OPHTHALMIC | Status: AC
  Filled 2016-07-05: qty 3.5

## 2016-07-05 MED ORDER — OXYCODONE HCL 5 MG/5ML PO SOLN
5.0000 mg | Freq: Once | ORAL | Status: AC | PRN
Start: 2016-07-05 — End: 2016-07-05

## 2016-07-05 MED ORDER — BACITRACIN ZINC 500 UNIT/GM EX OINT
TOPICAL_OINTMENT | CUTANEOUS | Status: AC
  Filled 2016-07-05: qty 28.35

## 2016-07-05 MED ORDER — PHENOL 1.4 % MT LIQD: 1.0000 | OROMUCOSAL | Status: DC | PRN

## 2016-07-05 MED ORDER — ONDANSETRON HCL 4 MG/2ML IJ SOLN
INTRAMUSCULAR | Status: DC | PRN
  Administered 2016-07-05: 4 mg via INTRAVENOUS

## 2016-07-05 MED ORDER — SODIUM CHLORIDE 0.9 % IV SOLN: 250.0000 mL | INTRAVENOUS | Status: DC

## 2016-07-05 MED ORDER — BUPIVACAINE-EPINEPHRINE (PF) 0.5% -1:200000 IJ SOLN
INTRAMUSCULAR | Status: DC | PRN
  Administered 2016-07-05: 6 mL via PERINEURAL

## 2016-07-05 MED ORDER — PROPOFOL 10 MG/ML IV BOLUS
INTRAVENOUS | Status: DC | PRN
  Administered 2016-07-05: 130 mg via INTRAVENOUS

## 2016-07-05 MED ORDER — CHLORHEXIDINE GLUCONATE CLOTH 2 % EX PADS: 6.0000 | MEDICATED_PAD | Freq: Once | CUTANEOUS | Status: DC

## 2016-07-05 MED ORDER — HEMOSTATIC AGENTS (NO CHARGE) OPTIME
TOPICAL | Status: DC | PRN
  Administered 2016-07-05: 1 via TOPICAL

## 2016-07-05 MED ORDER — SUGAMMADEX SODIUM 200 MG/2ML IV SOLN
INTRAVENOUS | Status: DC | PRN
  Administered 2016-07-05: 200 mg via INTRAVENOUS

## 2016-07-05 MED ORDER — SODIUM CHLORIDE 0.9 % IR SOLN
Status: DC | PRN
  Administered 2016-07-05: 13:00:00

## 2016-07-05 MED ORDER — ROCURONIUM BROMIDE 100 MG/10ML IV SOLN
INTRAVENOUS | Status: DC | PRN
  Administered 2016-07-05: 50 mg via INTRAVENOUS

## 2016-07-05 MED ORDER — LACTATED RINGERS IV SOLN
INTRAVENOUS | Status: DC
  Administered 2016-07-05: 11:00:00 via INTRAVENOUS

## 2016-07-05 MED ORDER — MIDAZOLAM HCL 2 MG/2ML IJ SOLN
INTRAMUSCULAR | Status: AC
  Filled 2016-07-05: qty 2

## 2016-07-05 MED ORDER — ROCURONIUM BROMIDE 10 MG/ML (PF) SYRINGE
PREFILLED_SYRINGE | INTRAVENOUS | Status: AC
  Filled 2016-07-05: qty 5

## 2016-07-05 MED ORDER — FENTANYL CITRATE (PF) 100 MCG/2ML IJ SOLN
INTRAMUSCULAR | Status: DC | PRN
  Administered 2016-07-05 (×2): 100 ug via INTRAVENOUS
  Administered 2016-07-05: 50 ug via INTRAVENOUS

## 2016-07-05 MED ORDER — SODIUM CHLORIDE 0.9% FLUSH: 3.0000 mL | INTRAVENOUS | Status: DC | PRN

## 2016-07-05 MED ORDER — ZOLPIDEM TARTRATE 5 MG PO TABS: 5.0000 mg | ORAL_TABLET | Freq: Every evening | ORAL | Status: DC | PRN

## 2016-07-05 MED ORDER — ARTIFICIAL TEARS OPHTHALMIC OINT
TOPICAL_OINTMENT | OPHTHALMIC | Status: DC | PRN
  Administered 2016-07-05: 1 via OPHTHALMIC

## 2016-07-05 MED ORDER — OXYCODONE HCL 5 MG PO TABS
5.0000 mg | ORAL_TABLET | Freq: Once | ORAL | Status: AC | PRN
  Administered 2016-07-05: 5 mg via ORAL

## 2016-07-05 MED ORDER — ONDANSETRON HCL 4 MG PO TABS: 4.0000 mg | ORAL_TABLET | Freq: Four times a day (QID) | ORAL | Status: DC | PRN

## 2016-07-05 MED ORDER — OXYCODONE HCL 5 MG PO TABS
ORAL_TABLET | ORAL | Status: AC
  Filled 2016-07-05: qty 1

## 2016-07-05 MED ORDER — LIDOCAINE 2% (20 MG/ML) 5 ML SYRINGE
INTRAMUSCULAR | Status: AC
  Filled 2016-07-05: qty 5

## 2016-07-05 MED ORDER — FENTANYL CITRATE (PF) 100 MCG/2ML IJ SOLN: 25.0000 ug | INTRAMUSCULAR | Status: DC | PRN

## 2016-07-05 MED ORDER — 0.9 % SODIUM CHLORIDE (POUR BTL) OPTIME
TOPICAL | Status: DC | PRN
  Administered 2016-07-05: 1000 mL

## 2016-07-05 MED ORDER — DEXTROSE 5 % IV SOLN
INTRAVENOUS | Status: DC | PRN
  Administered 2016-07-05: 13:00:00 via INTRAVENOUS

## 2016-07-05 MED ORDER — DEXAMETHASONE SODIUM PHOSPHATE 10 MG/ML IJ SOLN
INTRAMUSCULAR | Status: DC | PRN
  Administered 2016-07-05: 10 mg via INTRAVENOUS

## 2016-07-05 MED ORDER — ONDANSETRON HCL 4 MG/2ML IJ SOLN: 4.0000 mg | Freq: Four times a day (QID) | INTRAMUSCULAR | Status: DC | PRN

## 2016-07-05 MED ORDER — DEXAMETHASONE SODIUM PHOSPHATE 10 MG/ML IJ SOLN
INTRAMUSCULAR | Status: AC
  Filled 2016-07-05: qty 1

## 2016-07-05 MED ORDER — ONDANSETRON HCL 4 MG/2ML IJ SOLN
INTRAMUSCULAR | Status: AC
  Filled 2016-07-05: qty 2

## 2016-07-05 MED ORDER — MORPHINE SULFATE (PF) 4 MG/ML IV SOLN
4.0000 mg | INTRAVENOUS | Status: DC | PRN
  Administered 2016-07-05: 4 mg via INTRAVENOUS

## 2016-07-05 MED ORDER — LIDOCAINE HCL (CARDIAC) 20 MG/ML IV SOLN
INTRAVENOUS | Status: DC | PRN
  Administered 2016-07-05: 50 mg via INTRAVENOUS

## 2016-07-05 MED ORDER — MIDAZOLAM HCL 5 MG/5ML IJ SOLN
INTRAMUSCULAR | Status: DC | PRN
  Administered 2016-07-05: 2 mg via INTRAVENOUS

## 2016-07-05 MED ORDER — METOCLOPRAMIDE HCL 5 MG/ML IJ SOLN
INTRAMUSCULAR | Status: AC
  Filled 2016-07-05: qty 2

## 2016-07-05 MED ORDER — METOCLOPRAMIDE HCL 5 MG/ML IJ SOLN
INTRAMUSCULAR | Status: DC | PRN
  Administered 2016-07-05: 10 mg via INTRAVENOUS

## 2016-07-05 MED ORDER — LACTATED RINGERS IV SOLN
INTRAVENOUS | Status: DC | PRN
  Administered 2016-07-05 (×2): via INTRAVENOUS

## 2016-07-05 MED ORDER — CYCLOBENZAPRINE HCL 10 MG PO TABS: 10.0000 mg | ORAL_TABLET | Freq: Three times a day (TID) | ORAL | Status: DC | PRN

## 2016-07-05 MED ORDER — SUGAMMADEX SODIUM 200 MG/2ML IV SOLN
INTRAVENOUS | Status: AC
  Filled 2016-07-05: qty 2

## 2016-07-05 MED ORDER — PHENYLEPHRINE HCL 10 MG/ML IJ SOLN
INTRAMUSCULAR | Status: DC | PRN
  Administered 2016-07-05: 45 ug/min via INTRAVENOUS

## 2016-07-05 MED ORDER — MENTHOL 3 MG MT LOZG: 1.0000 | LOZENGE | OROMUCOSAL | Status: DC | PRN

## 2016-07-05 MED ORDER — THROMBIN 5000 UNITS EX SOLR
CUTANEOUS | Status: DC | PRN
  Administered 2016-07-05 (×2): 5000 [IU] via TOPICAL

## 2016-07-05 MED ORDER — FENTANYL CITRATE (PF) 250 MCG/5ML IJ SOLN
INTRAMUSCULAR | Status: AC
  Filled 2016-07-05: qty 5

## 2016-07-05 MED ORDER — THROMBIN 5000 UNITS EX SOLR
CUTANEOUS | Status: AC
  Filled 2016-07-05: qty 10000

## 2016-07-05 MED ORDER — DOCUSATE SODIUM 100 MG PO CAPS: 100.0000 mg | ORAL_CAPSULE | Freq: Two times a day (BID) | ORAL | Status: DC

## 2016-07-05 MED ORDER — BISACODYL 10 MG RE SUPP: 10.0000 mg | Freq: Every day | RECTAL | Status: DC | PRN

## 2016-07-05 MED ORDER — SODIUM CHLORIDE 0.9% FLUSH: 3.0000 mL | Freq: Two times a day (BID) | INTRAVENOUS | Status: DC

## 2016-07-05 MED ORDER — MORPHINE SULFATE (PF) 4 MG/ML IV SOLN
INTRAVENOUS | Status: AC
  Filled 2016-07-05: qty 1

## 2016-07-05 MED ORDER — VANCOMYCIN HCL 1000 MG IV SOLR
INTRAVENOUS | Status: AC
  Filled 2016-07-05: qty 1000

## 2016-07-05 SURGICAL SUPPLY — 55 items
BAG DECANTER FOR FLEXI CONT (MISCELLANEOUS) ×2 IMPLANT
BENZOIN TINCTURE PRP APPL 2/3 (GAUZE/BANDAGES/DRESSINGS) ×2 IMPLANT
BLADE CLIPPER SURG (BLADE) IMPLANT
BUR MATCHSTICK NEURO 3.0 LAGG (BURR) IMPLANT
BUR PRECISION FLUTE 6.0 (BURR) IMPLANT
CARTRIDGE OIL MAESTRO DRILL (MISCELLANEOUS) ×1 IMPLANT
DERMABOND ADVANCED (GAUZE/BANDAGES/DRESSINGS)
DERMABOND ADVANCED .7 DNX12 (GAUZE/BANDAGES/DRESSINGS) IMPLANT
DEVICE IMPLANT NEUROSTIMULATOR (Neuro Prosthesis/Implant) ×2 IMPLANT
DIFFUSER DRILL AIR PNEUMATIC (MISCELLANEOUS) ×2 IMPLANT
DRAPE C-ARM 42X72 X-RAY (DRAPES) ×2 IMPLANT
DRAPE INCISE IOBAN 85X60 (DRAPES) IMPLANT
DRAPE LAPAROTOMY 100X72X124 (DRAPES) ×2 IMPLANT
DRAPE POUCH INSTRU U-SHP 10X18 (DRAPES) ×2 IMPLANT
DRAPE SURG 17X23 STRL (DRAPES) ×2 IMPLANT
ELECT REM PT RETURN 9FT ADLT (ELECTROSURGICAL) ×2
ELECTRODE REM PT RTRN 9FT ADLT (ELECTROSURGICAL) ×1 IMPLANT
ENVELOPE ABSORB ANTIBACTERIAL (Neuro Prosthesis/Implant) ×2 IMPLANT
GAUZE SPONGE 4X4 12PLY STRL (GAUZE/BANDAGES/DRESSINGS) ×2 IMPLANT
GAUZE SPONGE 4X4 16PLY XRAY LF (GAUZE/BANDAGES/DRESSINGS) IMPLANT
GLOVE BIO SURGEON STRL SZ8 (GLOVE) ×2 IMPLANT
GLOVE BIO SURGEON STRL SZ8.5 (GLOVE) ×2 IMPLANT
GLOVE EXAM NITRILE LRG STRL (GLOVE) IMPLANT
GLOVE EXAM NITRILE XL STR (GLOVE) IMPLANT
GLOVE EXAM NITRILE XS STR PU (GLOVE) IMPLANT
GLOVE SURG SS PI 6.5 STRL IVOR (GLOVE) ×6 IMPLANT
GOWN STRL REUS W/ TWL LRG LVL3 (GOWN DISPOSABLE) ×2 IMPLANT
GOWN STRL REUS W/ TWL XL LVL3 (GOWN DISPOSABLE) ×1 IMPLANT
GOWN STRL REUS W/TWL 2XL LVL3 (GOWN DISPOSABLE) IMPLANT
GOWN STRL REUS W/TWL LRG LVL3 (GOWN DISPOSABLE) ×2
GOWN STRL REUS W/TWL XL LVL3 (GOWN DISPOSABLE) ×1
KIT BASIN OR (CUSTOM PROCEDURE TRAY) ×2 IMPLANT
KIT ROOM TURNOVER OR (KITS) ×2 IMPLANT
NEEDLE HYPO 22GX1.5 SAFETY (NEEDLE) ×2 IMPLANT
NS IRRIG 1000ML POUR BTL (IV SOLUTION) ×2 IMPLANT
OIL CARTRIDGE MAESTRO DRILL (MISCELLANEOUS) ×2
PACK LAMINECTOMY NEURO (CUSTOM PROCEDURE TRAY) ×2 IMPLANT
PAD ARMBOARD 7.5X6 YLW CONV (MISCELLANEOUS) ×2 IMPLANT
PATTIES SURGICAL .5 X1 (DISPOSABLE) ×2 IMPLANT
RECHARGER INTELLIS (NEUROSURGERY SUPPLIES) ×2 IMPLANT
REPROGRAMMER INTELLIS (NEUROSURGERY SUPPLIES) ×2 IMPLANT
SPONGE LAP 4X18 X RAY DECT (DISPOSABLE) IMPLANT
SPONGE SURGIFOAM ABS GEL SZ50 (HEMOSTASIS) ×2 IMPLANT
STAPLER SKIN PROX WIDE 3.9 (STAPLE) ×2 IMPLANT
STRIP CLOSURE SKIN 1/2X4 (GAUZE/BANDAGES/DRESSINGS) ×2 IMPLANT
SUT SILK 0 TIES 10X30 (SUTURE) IMPLANT
SUT SILK 2 0 FS (SUTURE) ×2 IMPLANT
SUT SILK 2 0 TIES 10X30 (SUTURE) ×2 IMPLANT
SUT VIC AB 1 CT1 18XBRD ANBCTR (SUTURE) ×1 IMPLANT
SUT VIC AB 1 CT1 8-18 (SUTURE) ×1
SUT VIC AB 2-0 CP2 18 (SUTURE) ×2 IMPLANT
TAPE CLOTH SURG 4X10 WHT LF (GAUZE/BANDAGES/DRESSINGS) ×2 IMPLANT
TOWEL GREEN STERILE (TOWEL DISPOSABLE) ×2 IMPLANT
TOWEL GREEN STERILE FF (TOWEL DISPOSABLE) ×2 IMPLANT
WATER STERILE IRR 1000ML POUR (IV SOLUTION) ×2 IMPLANT

## 2016-07-05 NOTE — Anesthesia Preprocedure Evaluation (Signed)
Anesthesia Evaluation  Patient identified by MRN, date of birth, ID band Patient awake    Reviewed: Allergy & Precautions, NPO status , Patient's Chart, lab work & pertinent test results  History of Anesthesia Complications (+) PONV and history of anesthetic complications  Airway Mallampati: II  TM Distance: >3 FB Neck ROM: Full    Dental  (+) Caps, Missing   Pulmonary shortness of breath, asthma , COPD, Current Smoker,    breath sounds clear to auscultation       Cardiovascular hypertension, Pt. on medications  Rhythm:Regular     Neuro/Psych    GI/Hepatic Neg liver ROS, GERD  Medicated,  Endo/Other  negative endocrine ROS  Renal/GU negative Renal ROS     Musculoskeletal  (+) Arthritis ,   Abdominal   Peds  Hematology  (+) anemia ,   Anesthesia Other Findings   Reproductive/Obstetrics                             Anesthesia Physical Anesthesia Plan  ASA: III  Anesthesia Plan: General   Post-op Pain Management:    Induction: Intravenous  Airway Management Planned: Oral ETT  Additional Equipment: None  Intra-op Plan:   Post-operative Plan: Extubation in OR  Informed Consent: I have reviewed the patients History and Physical, chart, labs and discussed the procedure including the risks, benefits and alternatives for the proposed anesthesia with the patient or authorized representative who has indicated his/her understanding and acceptance.   Dental advisory given  Plan Discussed with: CRNA and Surgeon  Anesthesia Plan Comments:         Anesthesia Quick Evaluation

## 2016-07-05 NOTE — Anesthesia Procedure Notes (Signed)
Procedure Name: Intubation Date/Time: 07/05/2016 1:08 PM Performed by: Jacquiline Doe A Pre-anesthesia Checklist: Patient identified, Emergency Drugs available, Suction available and Patient being monitored Patient Re-evaluated:Patient Re-evaluated prior to inductionOxygen Delivery Method: Circle System Utilized and Circle system utilized Preoxygenation: Pre-oxygenation with 100% oxygen Intubation Type: IV induction and Cricoid Pressure applied Ventilation: Mask ventilation without difficulty Laryngoscope Size: Miller and 2 Grade View: Grade II Tube type: Oral Tube size: 7.0 mm Number of attempts: 3 (Attempt times one 33 MAC blade , times one #4 MAC blade per R. Manuella Blackson , CRNA . Unable to visualize cords . Laryngoscopy per Dr. Ermalene Postin #2 Sabra Heck blade successful times one attempt . BBSE check Positive EtCO2 .) Airway Equipment and Method: Stylet and Oral airway Placement Confirmation: ETT inserted through vocal cords under direct vision,  positive ETCO2 and breath sounds checked- equal and bilateral Secured at: 21 cm Tube secured with: Tape Dental Injury: Teeth and Oropharynx as per pre-operative assessment  Difficulty Due To: Difficulty was unanticipated, Difficult Airway- due to anterior larynx, Difficult Airway- due to limited oral opening and Difficult Airway- due to dentition

## 2016-07-05 NOTE — Transfer of Care (Signed)
Immediate Anesthesia Transfer of Care Note   Patient: Jasmine [name]  Procedure(s) Performed: Procedure(s): REPLACEMENT OF LUMBAR SPINAL CORD STIMULATOR BATTERY (N/A)  Patient Location: PACU  Anesthesia Type:General  Level of Consciousness: awake, sedated, patient cooperative and responds to stimulation  Airway & Oxygen Therapy: Patient Spontanous Breathing and Patient connected to nasal cannula oxygen  Post-op Assessment: Report given to RN, Post -op Vital signs reviewed and stable, Patient moving all extremities and Patient moving all extremities X 4  Post vital signs: Reviewed and stable  Last Vitals:  Vitals:   07/05/16 1055  BP: 140/80  Pulse: 83  Resp: 18  Temp: 37 C    Last Pain:  Vitals:   07/05/16 1101  TempSrc:   PainSc: 7          Complications: No apparent anesthesia complications

## 2016-07-05 NOTE — Discharge Instructions (Signed)

## 2016-07-05 NOTE — H&P (Signed)
Subjective: The patient is a 61 year old white female who's had several back surgeries. She has had a spinal cord stimulator placed which has worked well. The battery has gone down. She is elected to have it replaced.   Past Medical History:  Diagnosis Date  . Allergy   . Anemia   . Anxiety   . Arthritis   . Asthma   . Carpal tunnel syndrome    bilateral  . Complication of anesthesia    in the past has had N/V, the last few surgeries she has not been  . COPD (chronic obstructive pulmonary disease) ([place])   . GERD (gastroesophageal reflux disease)   . Headache   . Heart murmur 2005   never had any problems  . History of kidney stones   . Hypertension   . Hypoglycemia   . Hypoglycemia   . Neuromuscular disorder ([place])    pt denies  . Neuropathy    both arms  . Pneumonia   . PONV (postoperative nausea and vomiting)   . Sciatica   . Seizures ([place])    as a child when she would have an asthma attack. none as an adult  . Shortness of breath dyspnea     Past Surgical History:  Procedure Laterality Date  . ABDOMINAL HYSTERECTOMY  2005  . CERVICAL FUSION    . [place]  . cyst removal face    . disk repair     neck and lumbar region  . FOOT SURGERY Bilateral    to file bones down   . IMPLANTATION / PLACEMENT EPIDURAL NEUROSTIMULATOR ELECTRODES    . LAMINECTOMY     2006  . LUMBAR FUSION    . TONSILLECTOMY      Allergies  Allergen Reactions  . Lyrica [Pregabalin] Other (See Comments)    EXTREME SHAKING/TREMBLING  . Darvon [Propoxyphene]     UNSPECIFIED REACTION   . Augmentin [Amoxicillin-Pot Clavulanate] Nausea And Vomiting    Has patient had a PCN reaction causing immediate rash, facial/tongue/throat swelling, SOB or lightheadedness with hypotension:no Has patient had a PCN reaction causing severe rash involving mucus membranes or skin necrosis:No Has patient had a PCN reaction that required hospitalization:No Has patient had a PCN reaction occurring  within the last 10 years:Yes--ONLY N/V If all of the above answers are "NO", then may proceed with Cephalosporin use.   . Erythromycin Itching and Rash       . Vibramycin [Doxycycline Calcium] Itching and Rash    "[place] "    Social History  Substance Use Topics  . Smoking status: Current Every Day Smoker    Packs/day: 0.50    Types: Cigarettes  . Smokeless tobacco: Never Used     Comment: trying to quit  . Alcohol use Yes     Comment: 1 shot daily    Family History  Problem Relation Age of Onset  . Heart disease Mother   . Diabetes Mother   . Hypertension Mother   . Hyperlipidemia Mother   . COPD Mother   . COPD Father   . Diabetes Father   . Heart disease Father   . Cancer Father     throat  . Dementia Father   . Asthma Son    Prior to Admission medications   Medication Sig Start Date End Date Taking? Authorizing Provider  albuterol (PROVENTIL HFA;VENTOLIN HFA) 108 (90 Base) MCG/ACT inhaler Inhale 2 puffs into the lungs every 6 (six) hours as needed for wheezing or shortness  of breath. 06/22/16  Yes Timmothy Euler, MD  amLODipine (NORVASC) 10 MG tablet Take 1 tablet (10 mg total) by mouth daily. 01/03/16  Yes Timmothy Euler, MD  Aspirin-Salicylamide-Caffeine (BC HEADACHE POWDER PO) Take 1 packet by mouth 2 (two) times daily.    Yes Historical Provider, MD  benzonatate (TESSALON) 100 MG capsule Take 100 mg by mouth 3 (three) times daily as needed for cough.   Yes Historical Provider, MD  budesonide-formoterol (SYMBICORT) 160-4.5 MCG/ACT inhaler Inhale 2 puffs into the lungs 2 (two) times daily. 09/02/15  Yes Timmothy Euler, MD  calcium carbonate (OSCAL) 1500 (600 Ca) MG TABS tablet Take 600 mg by mouth daily at 12 noon.   Yes Historical Provider, MD  cetirizine (ZYRTEC) 10 MG tablet Take 10 mg by mouth daily as needed for allergies. Reported on 03/12/2015   Yes Historical Provider, MD  DULoxetine (CYMBALTA) 30 MG capsule Take 3 capsules (90 mg total) by mouth  daily. Patient taking differently: Take 30 mg by mouth 3 (three) times daily.  07/29/15  Yes Timmothy Euler, MD  gabapentin (NEURONTIN) 300 MG capsule Take 300 mg by mouth 3 (three) times daily as needed (for pain.).   Yes Historical Provider, MD  guaiFENesin (MUCINEX) 600 MG 12 hr tablet Take 600 mg by mouth 2 (two) times daily.    Yes Historical Provider, MD  HYDROcodone Bitartrate ER (HYSINGLA ER) 60 MG T24A Take 60 mg by mouth daily.    Yes Historical Provider, MD  HYDROcodone-acetaminophen (NORCO) 10-325 MG tablet Take 1 tablet by mouth every 8 (eight) hours.    Yes Historical Provider, MD  ipratropium-albuterol (DUONEB) 0.5-2.5 (3) MG/3ML SOLN Inhale 3 mLs into the lungs 4 (four) times daily as needed (for shortness of breath/wheezing (patient typicall uses once daily)). *May use as needed 02/21/16  Yes Historical Provider, MD  l-methylfolate-B6-B12 (METANX) 3-35-2 MG TABS tablet Take 1 tablet by mouth 2 (two) times daily. 12/13/15  Yes Timmothy Euler, MD  Lin[facility] PAIN RELIEF PATCH EX) Place 1 application onto the skin daily as needed (for hip pain/sciata).   Yes Historical Provider, MD  pantoprazole (PROTONIX) 40 MG tablet Take 1 tablet (40 mg total) by mouth daily. 07/29/15  Yes Timmothy Euler, MD  tiotropium (SPIRIVA HANDIHALER) 18 MCG inhalation capsule Place 1 capsule (18 mcg total) into inhaler and inhale daily. 06/22/16  Yes Timmothy Euler, MD  tizanidine (ZANAFLEX) 6 MG capsule Take 6 mg by mouth 3 (three) times daily.   Yes Historical Provider, MD  traZODone (DESYREL) 100 MG tablet Take 1 tablet (100 mg total) by mouth at bedtime. 05/04/16  Yes Timmothy Euler, MD  diclofenac sodium (VOLTAREN) 1 % GEL Apply topically 4 (four) times daily. Has not started yet    Historical Provider, MD  predniSONE (DELTASONE) 20 MG tablet 2 po at same time daily for 5 days Patient not taking: Reported on 06/27/2016 04/25/16   Timmothy Euler, MD     Review of Systems  Positive  ROS: As above  All other systems have been reviewed and were otherwise negative with the exception of those mentioned in the HPI and as above.  Objective: Vital signs in last 24 hours: Temp:  [98.6 F (37 C)] 98.6 F (37 C) (05/02 1055) Pulse Rate:  [83] 83 (05/02 1055) Resp:  [18] 18 (05/02 1055) BP: (140)/(80) 140/80 (05/02 1055) SpO2:  [99 %] 99 % (05/02 1055) Weight:  [48.8 kg (107 lb 9.6 oz)] 48.8  kg (107 lb 9.6 oz) (05/02 1055)  General Appearance: Alert Head: Normocephalic, without obvious abnormality, atraumatic Eyes: PERRL, conjunctiva/corneas clear, EOM's intact,    Ears: Normal  Throat: Normal  Neck: Supple, Back: unremarkable, the patient's lumbar incision is well-healed. Lungs: Clear to auscultation bilaterally, respirations unlabored Heart: Regular rate and rhythm, no murmur, rub or gallop Abdomen: Soft, non-tender Extremities: Extremities normal, atraumatic, no cyanosis or edema Skin: unremarkable  NEUROLOGIC:   Mental status: alert and oriented,Motor Exam - the patient has a chronic right foot drop. Sensory Exam - grossly normal Reflexes:  Coordination - grossly normal Gait - grossly normal Balance - grossly normal Cranial Nerves: I: smell Not tested  II: visual acuity  OS: Normal  OD: Normal   II: visual fields Full to confrontation  II: pupils Equal, round, reactive to light  III,VII: ptosis None  III,IV,VI: extraocular muscles  Full ROM  V: mastication Normal  V: facial light touch sensation  Normal  V,VII: corneal reflex  Present  VII: facial muscle function - upper  Normal  VII: facial muscle function - lower Normal  VIII: hearing Not tested  IX: soft palate elevation  Normal  IX,X: gag reflex Present  XI: trapezius strength  5/5  XI: sternocleidomastoid strength 5/5  XI: neck flexion strength  5/5  XII: tongue strength  Normal    Data Review Lab Results  Component Value Date   WBC 7.1 06/29/2016   HGB 11.2 (L) 06/29/2016   HCT 37.3  06/29/2016   MCV 82.2 06/29/2016   PLT 415 (H) 06/29/2016   Lab Results  Component Value Date   NA 136 06/29/2016   K 3.9 06/29/2016   CL 101 06/29/2016   CO2 24 06/29/2016   BUN 12 06/29/2016   CREATININE 0.66 06/29/2016   GLUCOSE 92 06/29/2016   No results found for: INR, PROTIME  Assessment/Plan: Malfunctioning spinal cord stimulator: I have discussed the situation with the patient and her husband. We have discussed the various treatment options including a replacement of her spinal cord stimulator and possibly the leads. I have described the surgery to them. We have discussed the risks, benefits, alternatives, expected postoperative course, and likelihood of achieving her goals with surgery. I have answered all their questions. She has decided to proceed with surgery.   Ophelia Charter 07/05/2016 12:43 PM

## 2016-07-05 NOTE — Op Note (Signed)
Brief history: The patient is a 61 year old white female who has had chronic back pain. She's had several prior surgeries by other physicians. She has had a previous spinal cord stimulator in place which has been functioning well. The battery went dead. I discussed situation with the patient. We discussed the various treatment options. She has decided to proceed with revision of the spinal cord stimulator after weighing the risks, benefits, and alternatives to surgery.  Preoperative diagnosis: Chronic back pain, malfunctioning spinal cord stimulator  Postop diagnosis: Same  Procedure: Revision of spinal cord stimulator (placement of a [employer] spinal cord stimulator serial number [id number])  Surgeon: Dr. Earle Gell  Assistant: None  Anesthesia: Gen. tracheal  Estimated blood loss: Minimal  Specimens: None  Drains: None  Complications: None  Description of procedure: The patient was brought to the operating room by the anesthesia team. General endotracheal anesthesia was induced. The patient was turned to the prone position on the Wilson frame. Her thoraco lumbar region was then prepared with Betadine scrub and Betadine solution. Sterile drapes were applied. I then injected the area to be incised with Marcaine with epinephrine solution. I used a scalpel to make an incision over the patient's spinal cord stimulator generator, incising through the old surgical scar. I used the Metzenbaum scissors electrocautery to expose the generator. I then removed the generator from its pocket. I disconnected the leads. I then connected the leads to the new generator. We confirmed that the generator and leads were functioning via telemetry. I then placed the generator and the mesh pocket and replace it into the patient's wound. We then removed the retractor and I reapproximated the patient's subcutaneous tissue with interrupted 2-0 Vicryl suture. I reapproximated the skin with Steri-Strips and benzoin. The  wound was then coated with bacitracin ointment. A sterile dressing was applied. The drapes were removed. The patient was then returned to the supine position. By report all sponge, asthma, and needle counts were correct at the end of this case.

## 2016-07-05 NOTE — OR Nursing (Signed)
Jasmine [name] ambulated around the unit to the bath room with minimal assist of one.  When she returned from the bathroom, I reviewed her instructions with her and her husband. All questions were answered.  Her pain was just below her normal level.  Jasmine [name] was discharged home into the care of her husband.

## 2016-07-05 NOTE — Progress Notes (Signed)
Patient ID: Jasmine [name], female   DOB: [birth year], 61 y.o.   MRN: [id number] Physician Discharge Summary  Patient ID: Jasmine [name] MRN: [id number] DOB/AGE: September 22, 1955 61 y.o.  Admit date: 07/05/2016 Discharge date: 07/05/2016  Admission Diagnoses:Failed back syndrome, chronic low back pain  Discharge Diagnoses: The same Active Problems:   * No active hospital problems. *   Discharged Condition: good  Hospital Course: I performed a revision of the patient's spinal cord stimulator on 07/05/16. The surgery went well.  The patient was subsequently discharged to home.  Consults: None Significant Diagnostic Studies: None Treatments: Revision of spinal cord stimulator Discharge Exam: Blood pressure (!) 149/81, pulse 84, temperature 97.5 F (36.4 C), resp. rate 11, height '5\' 3"'$  (1.6 m), weight 48.8 kg (107 lb 9.6 oz), SpO2 100 %. Patient is somnolent but arousable, she is moving her lower extremities well.  Disposition: Home  Discharge Instructions     Remove dressing in 72 hours    Complete by:  As directed    Call MD for:  difficulty breathing, headache or visual disturbances    Complete by:  As directed    Call MD for:  extreme fatigue    Complete by:  As directed    Call MD for:  hives    Complete by:  As directed    Call MD for:  persistant dizziness or light-headedness    Complete by:  As directed    Call MD for:  persistant nausea and vomiting    Complete by:  As directed    Call MD for:  redness, tenderness, or signs of infection (pain, swelling, redness, odor or green/yellow discharge around incision site)    Complete by:  As directed    Call MD for:  severe uncontrolled pain    Complete by:  As directed    Call MD for:  temperature >100.4    Complete by:  As directed    Diet - low sodium heart healthy    Complete by:  As directed    Discharge instructions    Complete by:  As directed    Call [phone] for a followup appointment. Take a stool softener while  you are using pain medications.   Driving Restrictions    Complete by:  As directed    Do not drive for 2 weeks.   Increase activity slowly    Complete by:  As directed    Lifting restrictions    Complete by:  As directed    Do not lift more than 5 pounds. No excessive bending or twisting.   May shower / Bathe    Complete by:  As directed    He may shower after the pain she is removed 3 days after surgery. Leave the incision alone.        SignedOphelia Charter 07/05/2016, 2:46 PM

## 2016-07-06 ENCOUNTER — Encounter (HOSPITAL_COMMUNITY): Payer: Self-pay | Admitting: Neurosurgery

## 2016-07-06 NOTE — Anesthesia Postprocedure Evaluation (Addendum)
Anesthesia Post Note  Patient: Jasmine [name]  Procedure(s) Performed: Procedure(s) (LRB): REPLACEMENT OF LUMBAR SPINAL CORD STIMULATOR BATTERY (N/A)  Patient location during evaluation: PACU Anesthesia Type: General Level of consciousness: awake and alert Pain management: pain level controlled Vital Signs Assessment: post-procedure vital signs reviewed and stable Respiratory status: spontaneous breathing, nonlabored ventilation, respiratory function stable and patient connected to nasal cannula oxygen Cardiovascular status: blood pressure returned to baseline and stable Postop Assessment: no signs of nausea or vomiting Anesthetic complications: no       Last Vitals:  Vitals:   07/05/16 1531 07/05/16 1600  BP:    Pulse: 83 83  Resp: 13 14  Temp:  36.7 C    Last Pain:  Vitals:   07/05/16 1500  TempSrc:   PainSc: 6                  Coryn Mosso

## 2016-07-10 ENCOUNTER — Encounter (HOSPITAL_COMMUNITY): Payer: Self-pay | Admitting: Oncology

## 2016-07-10 ENCOUNTER — Encounter (HOSPITAL_COMMUNITY): Attending: Oncology | Admitting: Oncology

## 2016-07-10 ENCOUNTER — Encounter (HOSPITAL_COMMUNITY)

## 2016-07-10 VITALS — BP 128/60 | HR 88 | Temp 98.3°F | Resp 16 | Ht 63.0 in | Wt 106.5 lb

## 2016-07-10 DIAGNOSIS — Z881 Allergy status to other antibiotic agents status: Secondary | ICD-10-CM | POA: Diagnosis not present

## 2016-07-10 DIAGNOSIS — Z880 Allergy status to penicillin: Secondary | ICD-10-CM | POA: Diagnosis not present

## 2016-07-10 DIAGNOSIS — D509 Iron deficiency anemia, unspecified: Secondary | ICD-10-CM | POA: Insufficient documentation

## 2016-07-10 DIAGNOSIS — K219 Gastro-esophageal reflux disease without esophagitis: Secondary | ICD-10-CM | POA: Insufficient documentation

## 2016-07-10 DIAGNOSIS — F17210 Nicotine dependence, cigarettes, uncomplicated: Secondary | ICD-10-CM | POA: Diagnosis not present

## 2016-07-10 DIAGNOSIS — F419 Anxiety disorder, unspecified: Secondary | ICD-10-CM | POA: Insufficient documentation

## 2016-07-10 DIAGNOSIS — D649 Anemia, unspecified: Secondary | ICD-10-CM | POA: Diagnosis not present

## 2016-07-10 DIAGNOSIS — R238 Other skin changes: Secondary | ICD-10-CM

## 2016-07-10 DIAGNOSIS — I10 Essential (primary) hypertension: Secondary | ICD-10-CM | POA: Insufficient documentation

## 2016-07-10 DIAGNOSIS — J449 Chronic obstructive pulmonary disease, unspecified: Secondary | ICD-10-CM

## 2016-07-10 DIAGNOSIS — R233 Spontaneous ecchymoses: Secondary | ICD-10-CM

## 2016-07-10 HISTORY — DX: Spontaneous ecchymoses: R23.3

## 2016-07-10 HISTORY — DX: Other skin changes: R23.8

## 2016-07-10 LAB — CBC WITH DIFFERENTIAL/PLATELET
BASOS ABS: 0.1 10*3/uL (ref 0.0–0.1)
Basophils Relative: 2 %
Eosinophils Absolute: 0.1 10*3/uL (ref 0.0–0.7)
Eosinophils Relative: 2 %
HCT: 35.8 % — ABNORMAL LOW (ref 36.0–46.0)
HEMOGLOBIN: 10.9 g/dL — AB (ref 12.0–15.0)
LYMPHS ABS: 2 10*3/uL (ref 0.7–4.0)
LYMPHS PCT: 23 %
MCH: 25.2 pg — AB (ref 26.0–34.0)
MCHC: 30.4 g/dL (ref 30.0–36.0)
MCV: 82.7 fL (ref 78.0–100.0)
Monocytes Absolute: 0.6 10*3/uL (ref 0.1–1.0)
Monocytes Relative: 7 %
NEUTROS ABS: 5.9 10*3/uL (ref 1.7–7.7)
NEUTROS PCT: 66 %
PLATELETS: 273 10*3/uL (ref 150–400)
RBC: 4.33 MIL/uL (ref 3.87–5.11)
RDW: 18.4 % — ABNORMAL HIGH (ref 11.5–15.5)
WBC: 8.7 10*3/uL (ref 4.0–10.5)

## 2016-07-10 LAB — FERRITIN: FERRITIN: 7 ng/mL — AB (ref 11–307)

## 2016-07-10 LAB — PLATELET FUNCTION ASSAY: Collagen / ADP: 151 seconds — ABNORMAL HIGH (ref 0–118)

## 2016-07-10 LAB — IRON AND TIBC
IRON: 51 ug/dL (ref 28–170)
SATURATION RATIOS: 10 % — AB (ref 10.4–31.8)
TIBC: 533 ug/dL — AB (ref 250–450)
UIBC: 482 ug/dL

## 2016-07-10 LAB — LACTATE DEHYDROGENASE: LDH: 183 U/L (ref 98–192)

## 2016-07-10 LAB — SEDIMENTATION RATE: Sed Rate: 13 mm/hr (ref 0–22)

## 2016-07-10 LAB — C-REACTIVE PROTEIN

## 2016-07-10 NOTE — Assessment & Plan Note (Addendum)
Easy bruising with a normal to minimally elevated platelet count.    All bruising is distal extremities without any petechial rash, spontaneous bleeding, or gingival bleeding.  Order placed for PFA.

## 2016-07-10 NOTE — Patient Instructions (Signed)
[place] at [facility] Discharge Instructions  RECOMMENDATIONS MADE BY THE CONSULTANT AND ANY TEST RESULTS WILL BE SENT TO YOUR REFERRING PHYSICIAN.  We will perform labs today. Return in 3 weeks for follow-up.  We will review results at that time.  Thank you for choosing [place] at [facility] to provide your oncology and hematology care.  To afford each patient quality time with our provider, please arrive at least 15 minutes before your scheduled appointment time.    If you have a lab appointment with the [facility] please come in thru the  Main Entrance and check in at the main information desk  You need to re-schedule your appointment should you arrive 10 or more minutes late.  We strive to give you quality time with our providers, and arriving late affects you and other patients whose appointments are after yours.  Also, if you no show three or more times for appointments you may be dismissed from the clinic at the providers discretion.     Again, thank you for choosing [facility].  Our hope is that these requests will decrease the amount of time that you wait before being seen by our physicians.       _____________________________________________________________  Should you have questions after your visit to [facility], please contact our office at [phone] between the hours of 8:30 a.m. and 4:30 p.m.  Voicemails left after 4:30 p.m. will not be returned until the following business day.  For prescription refill requests, have your pharmacy contact our office.       Resources For Cancer Patients and their Caregivers ? American Cancer Society: Can assist with transportation, wigs, general needs, runs Look Good Feel Better.        249-016-7179 ? Cancer Care: Provides financial assistance, online support groups, medication/co-pay assistance.  1-800-813-HOPE (251)741-7247) ? [facility] Assists [place] cancer patients and their families through emotional , educational and financial support.  (623)164-7821 ? [place] DSS Where to apply for food stamps, Medicaid and utility assistance. (606)551-2882 ? [facility]: Transportation to medical appointments. 228-096-8716 ? Social Security Administration: May apply for disability if have a Stage IV cancer. 915-825-1173 770-569-1962 ? [employer], Disability and Transit Services: Assists with nutrition, care and transit needs. [place] Support Programs: '@10RELATIVEDAYS'$ @ > Cancer Support Group  2nd Tuesday of the month 1pm-2pm, Journey Room  > Creative Journey  3rd Tuesday of the month 1130am-1pm, Journey Room  > Look Good Feel Better  1st Wednesday of the month 10am-12 noon, Journey Room (Call [place] to register [phone])

## 2016-07-10 NOTE — Progress Notes (Signed)
      [facility] Hematology/Oncology Consultation   Name: Jasmine [name]      MRN: [id number]    Location: Room/bed info not found  Date: 07/10/2016 Time:5:44 PM   REFERRING PHYSICIAN:  Samuel Bradshaw, MD (Primary Care Provider)  REASON FOR CONSULT:  Anemia   DIAGNOSIS:  Normocytic, normochromic (minimally hypochromic at time) anemia with preservation of WBC and intermittent episodes of thrombocytosis.  Anemia dating back to at least 02/2015 and relatively stable since that time.  HISTORY OF PRESENT ILLNESS:   Jasmine [name] is a 60 y.o. female with a medical history significant for COPD, tobacco abuse, right foot drop (chronic), headache, hypertension, lumbar stenosis, vitamin D deficiency, chronic back pain, chronic narcotic use who is referred to the [facility] for anemia.  She reports having a long-standing history of anemia dating back many years.  She reports being tried on an oral iron agent but due to side effects, namely nausea and vomiting, this was discontinued.  She denies having colonoscopy.  She reports being given stool cards by her primary care physician.  These are not yet completed she reports.  She denies any blood in her stools or black stools.  She denies any spontaneous bleeding including gingival bleeding or epistaxis.  She denies any hemoptysis or hematemesis.  She denies any gross hematuria.  She is not on any blood thinners but does take aspirin based treatment for headaches (BC powder).  She denies any B symptoms.  She does complain of easy bruising and bleeding.  She reports minor trauma leads to bruising.  She denies any abdominal, back, or upper aspect of extremity bruising.  She denies any hemarthroses.  She has chronic back pain.She follows neurosurgery for her back pain.  She recently underwent revision of spinal cord stimulator on 07/05/2016.  She does have a distant history of blood transfusion secondary to surgery on 11/11/2015  with L4-5 laminectomy with facetectomy for decompression of exiting nerve roots, more than will be required for placement of interbody graft, placement of anterior interbody device-8 mm expandable Medtronic cage 2, removal of previous pedicle screws at L3, L4, posterior instrumentation using cortical pedicle screws at L5, standard pedicle screws at L3, L4 Medtronic Solera, interbody arthrodesis, L4-5, use of locally harvested bone autograft, use of nonstructural bone allograft, posterior lateral arthrodesis, L3-4 and L4-5 by Dr. Nundkumar.  Review of Systems  Constitutional: Negative.  Negative for chills, fever and weight loss.  HENT: Negative.  Negative for nosebleeds.   Eyes: Negative.   Respiratory: Negative.  Negative for cough and hemoptysis.   Cardiovascular: Negative.  Negative for chest pain.  Gastrointestinal: Negative.  Negative for blood in stool, constipation, diarrhea, melena, nausea and vomiting.  Genitourinary: Negative.  Negative for hematuria.  Musculoskeletal: Positive for back pain (chronic).  Skin: Negative.  Negative for rash.  Neurological: Negative.  Negative for weakness.  Endo/Heme/Allergies: Bruises/bleeds easily.  Psychiatric/Behavioral: Negative.      PAST MEDICAL HISTORY:   Past Medical History:  Diagnosis Date  . Allergy   . Anemia   . Anxiety   . Arthritis   . Asthma   . Carpal tunnel syndrome    bilateral  . Complication of anesthesia    in the past has had N/V, the last few surgeries she has not been  . COPD (chronic obstructive pulmonary disease) (HCC)   . Easy bruising 07/10/2016  . GERD (gastroesophageal reflux disease)   . Headache   . Heart   murmur 2005   never had any problems  . History of kidney stones   . Hypertension   . Hypoglycemia   . Hypoglycemia   . Neuromuscular disorder (HCC)    pt denies  . Neuropathy    both arms  . Normocytic anemia 07/29/2015  . Pneumonia   . PONV (postoperative nausea and vomiting)   . Sciatica   .  Seizures (HCC)    as a child when she would have an asthma attack. none as an adult  . Shortness of breath dyspnea     ALLERGIES: Allergies  Allergen Reactions  . Lyrica [Pregabalin] Other (See Comments)    EXTREME SHAKING/TREMBLING  . Darvon [Propoxyphene]     UNSPECIFIED REACTION   . Augmentin [Amoxicillin-Pot Clavulanate] Nausea And Vomiting    Has patient had a PCN reaction causing immediate rash, facial/tongue/throat swelling, SOB or lightheadedness with hypotension:no Has patient had a PCN reaction causing severe rash involving mucus membranes or skin necrosis:No Has patient had a PCN reaction that required hospitalization:No Has patient had a PCN reaction occurring within the last 10 years:Yes--ONLY N/V If all of the above answers are "NO", then may proceed with Cephalosporin use.   . Erythromycin Itching and Rash       . Vibramycin [Doxycycline Calcium] Itching and Rash    "SPLOTCHING "      MEDICATIONS: I have reviewed the patient's current medications.    Current Outpatient Prescriptions on File Prior to Visit  Medication Sig Dispense Refill  . albuterol (PROVENTIL HFA;VENTOLIN HFA) 108 (90 Base) MCG/ACT inhaler Inhale 2 puffs into the lungs every 6 (six) hours as needed for wheezing or shortness of breath. 1 Inhaler 2  . amLODipine (NORVASC) 10 MG tablet Take 1 tablet (10 mg total) by mouth daily. 90 tablet 3  . Aspirin-Salicylamide-Caffeine (BC HEADACHE POWDER PO) Take 1 packet by mouth 2 (two) times daily.     . budesonide-formoterol (SYMBICORT) 160-4.5 MCG/ACT inhaler Inhale 2 puffs into the lungs 2 (two) times daily. 3 Inhaler 3  . calcium carbonate (OSCAL) 1500 (600 Ca) MG TABS tablet Take 600 mg by mouth daily at 12 noon.    . DULoxetine (CYMBALTA) 30 MG capsule Take 3 capsules (90 mg total) by mouth daily. (Patient taking differently: Take 30 mg by mouth 3 (three) times daily. ) 270 capsule 3  . guaiFENesin (MUCINEX) 600 MG 12 hr tablet Take 600 mg by mouth 2  (two) times daily.     . HYDROcodone Bitartrate ER (HYSINGLA ER) 60 MG T24A Take 60 mg by mouth daily.     . HYDROcodone-acetaminophen (NORCO) 10-325 MG tablet Take 1 tablet by mouth every 8 (eight) hours.     . ipratropium-albuterol (DUONEB) 0.5-2.5 (3) MG/3ML SOLN Inhale 3 mLs into the lungs 4 (four) times daily as needed (for shortness of breath/wheezing (patient typicall uses once daily)). *May use as needed    . l-methylfolate-B6-B12 (METANX) 3-35-2 MG TABS tablet Take 1 tablet by mouth 2 (two) times daily. 60 tablet 5  . pantoprazole (PROTONIX) 40 MG tablet Take 1 tablet (40 mg total) by mouth daily. 90 tablet 3  . tiotropium (SPIRIVA HANDIHALER) 18 MCG inhalation capsule Place 1 capsule (18 mcg total) into inhaler and inhale daily. 30 capsule 12  . tizanidine (ZANAFLEX) 6 MG capsule Take 6 mg by mouth 3 (three) times daily.    . traZODone (DESYREL) 100 MG tablet Take 1 tablet (100 mg total) by mouth at bedtime. 90 tablet 3  . cetirizine (  ZYRTEC) 10 MG tablet Take 10 mg by mouth daily as needed for allergies. Reported on 03/12/2015    . diclofenac sodium (VOLTAREN) 1 % GEL Apply topically 4 (four) times daily. Has not started yet    . gabapentin (NEURONTIN) 300 MG capsule Take 300 mg by mouth 3 (three) times daily as needed (for pain.).     No current facility-administered medications on file prior to visit.      PAST SURGICAL HISTORY Past Surgical History:  Procedure Laterality Date  . ABDOMINAL HYSTERECTOMY  2005  . CERVICAL FUSION    . [place]  . cyst removal face    . disk repair     neck and lumbar region  . FOOT SURGERY Bilateral    to file bones down   . IMPLANTATION / PLACEMENT EPIDURAL NEUROSTIMULATOR ELECTRODES    . LAMINECTOMY     2006  . LUMBAR FUSION    . SPINAL CORD STIMULATOR INSERTION N/A 07/05/2016   Procedure: REPLACEMENT OF LUMBAR SPINAL CORD STIMULATOR BATTERY;  Surgeon: Newman Pies, MD;  Location: [place];  Service: Neurosurgery;  Laterality:  N/A;  . TONSILLECTOMY      FAMILY HISTORY: Family History  Problem Relation Age of Onset  . Heart disease Mother   . Diabetes Mother   . Hypertension Mother   . Hyperlipidemia Mother   . COPD Mother   . COPD Father   . Diabetes Father   . Heart disease Father   . Cancer Father     throat  . Dementia Father   . Asthma Son   . Hypertension Sister   . Hyperlipidemia Sister   . Hypertension Brother   . Hyperlipidemia Brother   . Hypertension Brother   . Hyperlipidemia Brother    Mother deceased at the age of 48 recently. Father deceased at the age of 44 secondary to failure to thrive with a history of head and neck cancer. She has 1 half-sister. She has 2 half-brothers. She has 1 son who is deceased secondary to overdose.  SOCIAL HISTORY:  reports that she has been smoking Cigarettes.  She has a 35.00 pack-year smoking history. She has never used smokeless tobacco. She reports that she drinks about 8.4 oz of alcohol per week . She reports that she does not use drugs.  She reports one pack per day smoking 35 years.  She reports 2 shots of alcohol per night over the last 3-4 months.  She is working on cessation.  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  She is [place].  She is married 8 years.  Social History   Social History  . Marital status: Married    Spouse name: N/A  . Number of children: N/A  . Years of education: N/A   Social History Main Topics  . Smoking status: Current Every Day Smoker    Packs/day: 1.00    Years: 35.00    Types: Cigarettes  . Smokeless tobacco: Never Used     Comment: trying to quit  . Alcohol use 8.4 oz/week    14 Shots of liquor per week     Comment: occasionally  . Drug use: No  . Sexual activity: Not Asked   Other Topics Concern  . None   Social History Narrative  . None    PERFORMANCE STATUS: The patient's performance status is 2 - Symptomatic, <50% confined to bed  PHYSICAL EXAM: Most  Recent Vital Signs: Blood pressure 128/60, pulse  88, temperature 98.3 F (36.8 C), temperature source Oral, resp. rate 16, height 5' 3" (1.6 m), weight 106 lb 8 oz (48.3 kg), SpO2 97 %. BP 128/60 (BP Location: Left Arm, Patient Position: Sitting)   Pulse 88   Temp 98.3 F (36.8 C) (Oral)   Resp 16   Ht 5' 3" (1.6 m)   Wt 106 lb 8 oz (48.3 kg)   SpO2 97%   BMI 18.87 kg/m   General Appearance:    Alert, cooperative, no distress, appears older than stated age, accompanied by husband, sitting on rolling walker with seat, smelling of tobacco smoke.  Head:    Normocephalic, without obvious abnormality, atraumatic  Eyes:    Conjunctiva/corneas clear, EOM's intact, both eyes  Ears:    Normal TM's and external ear canals, both ears  Nose:   Nares normal, septum midline, mucosa normal, no drainage    or sinus tenderness  Throat:   Lips, mucosa, and tongue normal; teeth and gums normal, no petechial rash  Neck:   Supple, symmetrical, trachea midline, no adenopathy.  Back:     Symmetric, no curvature, ROM normal, no CVA tenderness  Lungs:     Clear to auscultation bilaterally, respirations unlabored, with significantly decreased breath sounds throughout.  Chest Wall:    No tenderness or deformity   Heart:    Regular rate and rhythm, S1 and S2 normal, no murmur, rub   or gallop  Breast Exam:    Not examined  Abdomen:     Soft, non-tender, bowel sounds active all four quadrants.  Genitalia:    Not examined  Rectal:    Not examined  Extremities:   Extremities normal, atraumatic, no cyanosis or edema, right leg brace for foot drop.  Pulses:   2+ and symmetric all extremities  Skin:   Skin color, texture, turgor normal, no rashes or lesions  Lymph nodes:   Cervical, supraclavicular, and axillary nodes normal  Neurologic:   CNII-XII intact, normal strength, sensation and reflexes    throughout    LABORATORY DATA:  Results for orders placed or performed in visit on 07/10/16 (from the past 48  hour(s))  CBC with Differential     Status: Abnormal   Collection Time: 07/10/16 12:24 PM  Result Value Ref Range   WBC 8.7 4.0 - 10.5 K/uL   RBC 4.33 3.87 - 5.11 MIL/uL   Hemoglobin 10.9 (L) 12.0 - 15.0 g/dL   HCT 35.8 (L) 36.0 - 46.0 %   MCV 82.7 78.0 - 100.0 fL   MCH 25.2 (L) 26.0 - 34.0 pg   MCHC 30.4 30.0 - 36.0 g/dL   RDW 18.4 (H) 11.5 - 15.5 %   Platelets 273 150 - 400 K/uL   Neutrophils Relative % 66 %   Neutro Abs 5.9 1.7 - 7.7 K/uL   Lymphocytes Relative 23 %   Lymphs Abs 2.0 0.7 - 4.0 K/uL   Monocytes Relative 7 %   Monocytes Absolute 0.6 0.1 - 1.0 K/uL   Eosinophils Relative 2 %   Eosinophils Absolute 0.1 0.0 - 0.7 K/uL   Basophils Relative 2 %   Basophils Absolute 0.1 0.0 - 0.1 K/uL  Lactate dehydrogenase     Status: None   Collection Time: 07/10/16 12:24 PM  Result Value Ref Range   LDH 183 98 - 192 U/L  Sedimentation rate     Status: None   Collection Time: 07/10/16 12:24 PM  Result Value Ref Range   Sed Rate 13 0 -  22 mm/hr  Platelet function assay     Status: Abnormal   Collection Time: 07/10/16 12:24 PM  Result Value Ref Range   Collagen / ADP 151 (H) 0 - 118 seconds   PFA Interpretation            Comment: Platelet function is abnormal. This pattern is most commonly seen in patients with von Willebrand disease or congenital platelet defects.  Prolongation of the Col/ADP closure time generally indicate a more extensive qualitative platelet defect and/or abnormality in von Willebrand factor.        Results of the test should always be interpreted in conjunction with the patient's medical history, clinical presentation and medication history. Patients with Hematocrit values <35.0% or Platelet counts <150,000/uL may result in values above the Laboratory established reference range.    Collagen / Epinephrine >300 (H) 0 - 193 seconds      RADIOGRAPHY: No results found.     PATHOLOGY:  N/A  ASSESSMENT/PLAN:   Normocytic anemia Normocytic,  normochromic (minimally hypochromic at time) anemia with preservation of WBC and intermittent episodes of thrombocytosis.  Anemia dating back to at least 02/2015 and relatively stable since that time.  Peripheral work-up by primary care physician demonstrates a normal B12 and folate and a low-normal ferritin at 21.  No other iron studies performed (iron/TIBC).  Given than ferritin is an acute phased reactant and her other co-morbidities including chronic pain, ferritin may be falsely elevated.  Labs today: CBC diff, iron/TIBC, ferritin, haptoglobin, EPO level, retic count, pathology smear review, ESR, CRP  Given her anemia and age, screening for multiple myeloma is ordered as well:  SPEP +IFE, light chain assay.  No hypercalcemia or renal disease noted on previous labs.  Suspect her minimal and intermittent thrombocytosis is reactive and therefore, work-up for MPD without any other clinical signs/symptoms is not indicated at this time.  I personally reviewed and went over radiographic studies with the patient.  The results are noted within this dictation.  I personally reviewed the images in PACS.  CT imaging of back and Dg chest imaging are reviewed.  No evidence of any hematologic issues from these tests.  Imaging of back demonstrates multi-level vertebral disease/pathology.  Imaging of chest   Return in 2-3 weeks for follow-up and to review lab tests with further hematology recommendations.  If iron deficiency is identified, then stool cards x 3 +/- GI referral will be recommended.  Easy bruising Easy bruising with a normal to minimally elevated platelet count.    All bruising is distal extremities without any petechial rash, spontaneous bleeding, or gingival bleeding.  Order placed for PFA.   ORDERS PLACED FOR THIS ENCOUNTER: Orders Placed This Encounter  Procedures  . CBC with Differential  . Lactate dehydrogenase  . Sedimentation rate  . Pathologist smear review  . Iron and TIBC    . Ferritin  . Erythropoietin  . Haptoglobin  . Kappa/lambda light chains  . IgG, IgA, IgM  . Immunofixation electrophoresis  . Protein electrophoresis, serum  . C-reactive protein  . Platelet function assay    MEDICATIONS PRESCRIBED THIS ENCOUNTER: No orders of the defined types were placed in this encounter.   All questions were answered. The patient knows to call the clinic with any problems, questions or concerns. We can certainly see the patient much sooner if necessary.  Patient discussed with Dr. Zhou and together we ascertained an up-to-date interval history, and examined the patient.  Dr. Zhou developed the patient's assessment and   plan.  This was a shared visit-consultation.  Her attestation will follow below.  This note is electronically signed by: Doy Mince 07/10/2016 5:44 PM

## 2016-07-10 NOTE — Assessment & Plan Note (Addendum)
Normocytic, normochromic (minimally hypochromic at time) anemia with preservation of WBC and intermittent episodes of thrombocytosis.  Anemia dating back to at least 02/2015 and relatively stable since that time.  Peripheral work-up by primary care physician demonstrates a normal B12 and folate and a low-normal ferritin at 21.  No other iron studies performed (iron/TIBC).  Given than ferritin is an acute phased reactant and her other co-morbidities including chronic pain, ferritin may be falsely elevated.  Labs today: CBC diff, iron/TIBC, ferritin, haptoglobin, EPO level, retic count, pathology smear review, ESR, CRP  Given her anemia and age, screening for multiple myeloma is ordered as well:  SPEP +IFE, light chain assay.  No hypercalcemia or renal disease noted on previous labs.  Suspect her minimal and intermittent thrombocytosis is reactive and therefore, work-up for MPD without any other clinical signs/symptoms is not indicated at this time.  I personally reviewed and went over radiographic studies with the patient.  The results are noted within this dictation.  I personally reviewed the images in PACS.  CT imaging of back and Dg chest imaging are reviewed.  No evidence of any hematologic issues from these tests.  Imaging of back demonstrates multi-level vertebral disease/pathology.  Imaging of chest   Return in 2-3 weeks for follow-up and to review lab tests with further hematology recommendations.  If iron deficiency is identified, then stool cards x 3 +/- GI referral will be recommended.

## 2016-07-11 ENCOUNTER — Other Ambulatory Visit (HOSPITAL_COMMUNITY): Payer: Self-pay | Admitting: Oncology

## 2016-07-11 ENCOUNTER — Encounter (HOSPITAL_COMMUNITY): Payer: Self-pay | Admitting: Oncology

## 2016-07-11 DIAGNOSIS — D509 Iron deficiency anemia, unspecified: Secondary | ICD-10-CM

## 2016-07-11 HISTORY — DX: Iron deficiency anemia, unspecified: D50.9

## 2016-07-11 LAB — PROTEIN ELECTROPHORESIS, SERUM
A/G Ratio: 1.3 (ref 0.7–1.7)
Albumin ELP: 4 g/dL (ref 2.9–4.4)
Alpha-1-Globulin: 0.3 g/dL (ref 0.0–0.4)
Alpha-2-Globulin: 0.7 g/dL (ref 0.4–1.0)
BETA GLOBULIN: 1.2 g/dL (ref 0.7–1.3)
GLOBULIN, TOTAL: 3 g/dL (ref 2.2–3.9)
Gamma Globulin: 0.8 g/dL (ref 0.4–1.8)
TOTAL PROTEIN ELP: 7 g/dL (ref 6.0–8.5)

## 2016-07-11 LAB — ERYTHROPOIETIN: Erythropoietin: 63.1 m[IU]/mL — ABNORMAL HIGH (ref 2.6–18.5)

## 2016-07-11 LAB — HAPTOGLOBIN: Haptoglobin: 163 mg/dL (ref 34–200)

## 2016-07-11 LAB — IGG, IGA, IGM
IGA: 198 mg/dL (ref 87–352)
IGG (IMMUNOGLOBIN G), SERUM: 630 mg/dL — AB (ref 700–1600)
IgM, Serum: 102 mg/dL (ref 26–217)

## 2016-07-11 LAB — PATHOLOGIST SMEAR REVIEW

## 2016-07-11 LAB — KAPPA/LAMBDA LIGHT CHAINS
KAPPA, LAMDA LIGHT CHAIN RATIO: 0.88 (ref 0.26–1.65)
Kappa free light chain: 14.8 mg/L (ref 3.3–19.4)
Lambda free light chains: 16.8 mg/L (ref 5.7–26.3)

## 2016-07-17 LAB — IMMUNOFIXATION ELECTROPHORESIS
IGA: 197 mg/dL (ref 87–352)
IGG (IMMUNOGLOBIN G), SERUM: 682 mg/dL — AB (ref 700–1600)
IGM, SERUM: 100 mg/dL (ref 26–217)
Total Protein ELP: 6.8 g/dL (ref 6.0–8.5)

## 2016-07-19 ENCOUNTER — Telehealth: Payer: Self-pay | Admitting: Family Medicine

## 2016-07-19 ENCOUNTER — Encounter: Payer: Self-pay | Admitting: Family Medicine

## 2016-07-19 ENCOUNTER — Other Ambulatory Visit: Payer: Self-pay | Admitting: *Deleted

## 2016-07-19 MED ORDER — ALBUTEROL SULFATE HFA 108 (90 BASE) MCG/ACT IN AERS: 2.0000 | INHALATION_SPRAY | Freq: Four times a day (QID) | RESPIRATORY_TRACT | 3 refills | Status: DC | PRN

## 2016-07-19 NOTE — Telephone Encounter (Signed)
Pt needed refill sent to [facility] Last months was sent to [facility] does not want to pick up refills there

## 2016-07-20 ENCOUNTER — Encounter (HOSPITAL_COMMUNITY): Payer: Self-pay

## 2016-07-20 ENCOUNTER — Encounter (HOSPITAL_BASED_OUTPATIENT_CLINIC_OR_DEPARTMENT_OTHER)

## 2016-07-20 VITALS — BP 131/66 | HR 71 | Temp 98.0°F | Resp 18

## 2016-07-20 DIAGNOSIS — D509 Iron deficiency anemia, unspecified: Secondary | ICD-10-CM

## 2016-07-20 MED ORDER — SODIUM CHLORIDE 0.9 % IV SOLN
510.0000 mg | Freq: Once | INTRAVENOUS | Status: AC
Start: 2016-07-20 — End: 2016-07-20
  Administered 2016-07-20: 510 mg via INTRAVENOUS
  Filled 2016-07-20: qty 17

## 2016-07-20 MED ORDER — SODIUM CHLORIDE 0.9 % IV SOLN
Freq: Once | INTRAVENOUS | Status: AC
  Administered 2016-07-20: 15:00:00 via INTRAVENOUS

## 2016-07-20 NOTE — Patient Instructions (Signed)
[facility] at [facility] Discharge Instructions  RECOMMENDATIONS MADE BY THE CONSULTANT AND ANY TEST RESULTS WILL BE SENT TO YOUR REFERRING PHYSICIAN.  Feraheme given today Follow up as scheduled.  Thank you for choosing [facility] at [facility] to provide your oncology and hematology care.  To afford each patient quality time with our provider, please arrive at least 15 minutes before your scheduled appointment time.    If you have a lab appointment with the Cancer Center please come in thru the  Main Entrance and check in at the main information desk  You need to re-schedule your appointment should you arrive 10 or more minutes late.  We strive to give you quality time with our providers, and arriving late affects you and other patients whose appointments are after yours.  Also, if you no show three or more times for appointments you may be dismissed from the clinic at the providers discretion.     Again, thank you for choosing [facility].  Our hope is that these requests will decrease the amount of time that you wait before being seen by our physicians.       _____________________________________________________________  Should you have questions after your visit to [facility], please contact our office at [phone] between the hours of 8:30 a.m. and 4:30 p.m.  Voicemails left after 4:30 p.m. will not be returned until the following business day.  For prescription refill requests, have your pharmacy contact our office.       Resources For Cancer Patients and their Caregivers ? American Cancer Society: Can assist with transportation, wigs, general needs, runs Look Good Feel Better.        1-888-227-6333 ? Cancer Care: Provides financial assistance, online support groups, medication/co-pay assistance.  1-800-813-HOPE (4673) ? [facility] Assists [place] cancer patients and  their families through emotional , educational and financial support.  336-427-4357 ? [place] DSS Where to apply for food stamps, Medicaid and utility assistance. 336-342-1394 ? [facility]: Transportation to medical appointments. 336-347-2287 ? Social Security Administration: May apply for disability if have a Stage IV cancer. 336-342-7796 1-800-772-1213 ? [place] Aging, Disability and Transit Services: Assists with nutrition, care and transit needs. 336-349-2343  Cancer Center Support Programs: @10RELATIVEDAYS@ > Cancer Support Group  2nd Tuesday of the month 1pm-2pm, Journey Room  > Creative Journey  3rd Tuesday of the month 1130am-1pm, Journey Room  > Look Good Feel Better  1st Wednesday of the month 10am-12 noon, Journey Room (Call American Cancer Society to register 1-800-395-5775)   

## 2016-07-20 NOTE — Progress Notes (Signed)
Feraheme given today per orders. Patient tolerated it well without problems. Vitals stable and discharged home from clinic ambulatory with walker. Follow up as scheduled.

## 2016-07-24 ENCOUNTER — Encounter: Payer: Self-pay | Admitting: Family Medicine

## 2016-07-25 ENCOUNTER — Encounter: Payer: Self-pay | Admitting: Family Medicine

## 2016-07-25 ENCOUNTER — Ambulatory Visit (INDEPENDENT_AMBULATORY_CARE_PROVIDER_SITE_OTHER): Admitting: Family Medicine

## 2016-07-25 ENCOUNTER — Other Ambulatory Visit: Payer: Self-pay | Admitting: Neurosurgery

## 2016-07-25 ENCOUNTER — Ambulatory Visit (INDEPENDENT_AMBULATORY_CARE_PROVIDER_SITE_OTHER)

## 2016-07-25 VITALS — BP 124/71 | HR 76 | Temp 97.4°F | Ht 63.0 in | Wt 107.8 lb

## 2016-07-25 DIAGNOSIS — M25572 Pain in left ankle and joints of left foot: Secondary | ICD-10-CM | POA: Diagnosis not present

## 2016-07-25 DIAGNOSIS — M545 Low back pain: Principal | ICD-10-CM

## 2016-07-25 DIAGNOSIS — G8929 Other chronic pain: Secondary | ICD-10-CM

## 2016-07-25 MED ORDER — PREDNISONE 20 MG PO TABS: ORAL_TABLET | ORAL | 0 refills | Status: DC

## 2016-07-25 NOTE — Progress Notes (Signed)
   HPI  Patient presents today here with ankle pain.  Patient's when she's had one week of severe left ankle pain. She describes it as anterior lateral ankle pain. She denies injury. It's more difficult to walk than usual. Chest severe pain with bearing weight completely on that left ankle. She is able walk with a walker. Denies erythema, swelling, and warmth.  Patient is on hydrocodone chronically for back pain. She is possibly having repeat back surgery with neurosurgery.  PMH: Smoking status noted ROS: Per HPI  Objective: BP 124/71   Pulse 76   Temp 97.4 F (36.3 C) (Oral)   Ht '5\' 3"'$  (1.6 m)   Wt 107 lb 12.8 oz (48.9 kg)   BMI 19.10 kg/m  Gen: NAD, alert, cooperative with exam HEENT: NCAT CV: RRR, good S1/S2, no murmur Resp: CTABL, no wheezes, non-labored Ext: No edema, warm Neuro: Alert and oriented, walks with a 4 wheeled walker  MSK: Tenderness to palp over the L ATFL, no joint laxity, redness, warmth, or swelling.   Assessment and plan:  # Acute left ankle pain Unclear etiology, tenderness over the left ATFL without any joint laxity or injury Drastic sprained ankle. Recommended short course of prednisone as this has helped patient significantly with other pains, ice, compression Consider Ortho referral if persistent    Orders Placed This Encounter  Procedures  . DG Ankle Complete Left    Standing Status:   Future    Number of Occurrences:   1    Standing Expiration Date:   09/24/2017    Order Specific Question:   Reason for Exam (SYMPTOM  OR DIAGNOSIS REQUIRED)    Answer:   ankle pain    Order Specific Question:   Preferred imaging location?    Answer:   Internal    Meds ordered this encounter  Medications  . predniSONE (DELTASONE) 20 MG tablet    Sig: 2 po at sametime daily for 5 days- start tomorrow    Dispense:  10 tablet    Refill:  0    Laroy Apple, MD [facility] 07/25/2016, 2:58 PM

## 2016-07-25 NOTE — Patient Instructions (Signed)
Great to see you!  Start prednisone, try ice 15 minutes 4 times daily, continue wearing the ace bandage  We could consider referral to an orthopedist if this does not improve as expected.

## 2016-08-02 ENCOUNTER — Encounter (HOSPITAL_COMMUNITY): Payer: Self-pay

## 2016-08-02 ENCOUNTER — Encounter (HOSPITAL_BASED_OUTPATIENT_CLINIC_OR_DEPARTMENT_OTHER): Admitting: Oncology

## 2016-08-02 ENCOUNTER — Encounter (HOSPITAL_COMMUNITY)

## 2016-08-02 VITALS — BP 133/66 | HR 89 | Temp 98.8°F | Resp 18 | Wt 108.8 lb

## 2016-08-02 DIAGNOSIS — D508 Other iron deficiency anemias: Secondary | ICD-10-CM

## 2016-08-02 DIAGNOSIS — D509 Iron deficiency anemia, unspecified: Secondary | ICD-10-CM

## 2016-08-02 LAB — COMPREHENSIVE METABOLIC PANEL
ALT: 16 U/L (ref 14–54)
AST: 22 U/L (ref 15–41)
Albumin: 4 g/dL (ref 3.5–5.0)
Alkaline Phosphatase: 70 U/L (ref 38–126)
Anion gap: 10 (ref 5–15)
BILIRUBIN TOTAL: 0.4 mg/dL (ref 0.3–1.2)
BUN: 14 mg/dL (ref 6–20)
CO2: 24 mmol/L (ref 22–32)
Calcium: 9.2 mg/dL (ref 8.9–10.3)
Chloride: 103 mmol/L (ref 101–111)
Creatinine, Ser: 0.74 mg/dL (ref 0.44–1.00)
Glucose, Bld: 81 mg/dL (ref 65–99)
POTASSIUM: 4.9 mmol/L (ref 3.5–5.1)
Sodium: 137 mmol/L (ref 135–145)
TOTAL PROTEIN: 7.3 g/dL (ref 6.5–8.1)

## 2016-08-02 LAB — CBC WITH DIFFERENTIAL/PLATELET
BASOS PCT: 1 %
Basophils Absolute: 0.1 10*3/uL (ref 0.0–0.1)
EOS PCT: 3 %
Eosinophils Absolute: 0.2 10*3/uL (ref 0.0–0.7)
HEMATOCRIT: 39.4 % (ref 36.0–46.0)
Hemoglobin: 11.9 g/dL — ABNORMAL LOW (ref 12.0–15.0)
LYMPHS ABS: 2.2 10*3/uL (ref 0.7–4.0)
Lymphocytes Relative: 30 %
MCH: 26.3 pg (ref 26.0–34.0)
MCHC: 30.2 g/dL (ref 30.0–36.0)
MCV: 87.2 fL (ref 78.0–100.0)
MONO ABS: 0.7 10*3/uL (ref 0.1–1.0)
MONOS PCT: 10 %
NEUTROS ABS: 4.1 10*3/uL (ref 1.7–7.7)
Neutrophils Relative %: 56 %
Platelets: 214 10*3/uL (ref 150–400)
RBC: 4.52 MIL/uL (ref 3.87–5.11)
RDW: 24.2 % — AB (ref 11.5–15.5)
WBC: 7.3 10*3/uL (ref 4.0–10.5)

## 2016-08-02 LAB — IRON AND TIBC
IRON: 57 ug/dL (ref 28–170)
SATURATION RATIOS: 14 % (ref 10.4–31.8)
TIBC: 395 ug/dL (ref 250–450)
UIBC: 338 ug/dL

## 2016-08-02 LAB — FERRITIN: FERRITIN: 127 ng/mL (ref 11–307)

## 2016-08-02 NOTE — Progress Notes (Signed)
[facility] Follow up:    Timmothy Euler, MD [place] [id number]   DIAGNOSIS: Iron deficiency anemia  INTERVAL HISTORY: Jasmine [name] 61 y.o. female returns for follow up of her anemia. She received 1 dose of Feraheme on 07/20/16, after her initial anemia workup demonstrated that she had a ferritin of 7. She states she still has chronic fatigue. CBC performed today demonstrated at her hemoglobin is now up to 11.9 g/dL from 10.9 g/dL prior to her receiving feraheme. She states that today she is having pain in her legs from her sciatica. Denies any chest pain, shortness breath, abdominal pain, focal weakness.   Patient Active Problem List   Diagnosis Date Noted  . Iron deficiency anemia 07/11/2016  . Easy bruising 07/10/2016  . Postoperative anemia due to acute blood loss 11/15/2015  . Lumbar stenosis 11/11/2015  . Leg pain 09/24/2015  . Healthcare maintenance 09/02/2015  . Normocytic anemia 07/29/2015  . Vitamin D deficiency 07/29/2015  . Chronic back pain 02/22/2015  . HTN (hypertension) 02/22/2015  . Chronic narcotic use 02/22/2015  . COPD (chronic obstructive pulmonary disease) ([place]) 02/22/2015  . Headache 02/22/2015    is allergic to lyrica [pregabalin]; darvon [propoxyphene]; augmentin [amoxicillin-pot clavulanate]; erythromycin; and vibramycin [doxycycline calcium].  MEDICAL HISTORY: Past Medical History:  Diagnosis Date  . Allergy   . Anemia   . Anxiety   . Arthritis   . Asthma   . Carpal tunnel syndrome    bilateral  . Complication of anesthesia    in the past has had N/V, the last few surgeries she has not been  . COPD (chronic obstructive pulmonary disease) ([place])   . Easy bruising 07/10/2016  . GERD (gastroesophageal reflux disease)   . Headache   . Heart murmur 2005   never had any problems  . History of kidney stones   . Hypertension   . Hypoglycemia   . Hypoglycemia   . Iron deficiency anemia 07/11/2016  .  Neuromuscular disorder ([place])    pt denies  . Neuropathy    both arms  . Normocytic anemia 07/29/2015  . Pneumonia   . PONV (postoperative nausea and vomiting)   . Sciatica   . Seizures ([place])    as a child when she would have an asthma attack. none as an adult  . Shortness of breath dyspnea     SURGICAL HISTORY: Past Surgical History:  Procedure Laterality Date  . ABDOMINAL HYSTERECTOMY  2005  . CERVICAL FUSION    . [place]  . cyst removal face    . disk repair     neck and lumbar region  . FOOT SURGERY Bilateral    to file bones down   . IMPLANTATION / PLACEMENT EPIDURAL NEUROSTIMULATOR ELECTRODES    . LAMINECTOMY     2006  . LUMBAR FUSION    . SPINAL CORD STIMULATOR INSERTION N/A 07/05/2016   Procedure: REPLACEMENT OF LUMBAR SPINAL CORD STIMULATOR BATTERY;  Surgeon: Newman Pies, MD;  Location: [facility];  Service: Neurosurgery;  Laterality: N/A;  . TONSILLECTOMY      SOCIAL HISTORY: Social History   Social History  . Marital status: Married    Spouse name: N/A  . Number of children: N/A  . Years of education: N/A   Occupational History  . Not on file.   Social History Main Topics  . Smoking status: Current Every Day Smoker    Packs/day: 1.00  Years: 35.00    Types: Cigarettes  . Smokeless tobacco: Never Used     Comment: trying to quit  . Alcohol use 8.4 oz/week    14 Shots of liquor per week     Comment: occasionally  . Drug use: No  . Sexual activity: Not on file   Other Topics Concern  . Not on file   Social History Narrative  . No narrative on file    FAMILY HISTORY: Family History  Problem Relation Age of Onset  . Heart disease Mother   . Diabetes Mother   . Hypertension Mother   . Hyperlipidemia Mother   . COPD Mother   . COPD Father   . Diabetes Father   . Heart disease Father   . Cancer Father        throat  . Dementia Father   . Asthma Son   . Hypertension Sister   . Hyperlipidemia Sister   . Hypertension  Brother   . Hyperlipidemia Brother   . Hypertension Brother   . Hyperlipidemia Brother     Review of Systems  Constitutional: Negative for appetite change, chills, fatigue and fever.  HENT:   Negative for hearing loss, lump/mass, mouth sores, sore throat and tinnitus.   Eyes: Negative for eye problems and icterus.  Respiratory: Negative for chest tightness, cough, hemoptysis, shortness of breath and wheezing.   Cardiovascular: Negative for chest pain, leg swelling and palpitations.  Gastrointestinal: Negative for abdominal distention, abdominal pain, blood in stool, diarrhea, nausea and vomiting.  Endocrine: Negative.  Negative for hot flashes.  Genitourinary: Negative for difficulty urinating, frequency and hematuria.   Musculoskeletal: Negative for arthralgias and neck pain.  Skin: Negative for itching and rash.  Neurological: Negative for dizziness, headaches and speech difficulty.  Hematological: Negative for adenopathy. Does not bruise/bleed easily.  Psychiatric/Behavioral: Negative for confusion. The patient is not nervous/anxious.       PHYSICAL EXAMINATION   Vitals:   08/02/16 1030  BP: 133/66  Pulse: 89  Resp: 18  Temp: 98.8 F (37.1 C)    Physical Exam  Constitutional: She is oriented to person, place, and time and well-developed, well-nourished, and in no distress. No distress.  HENT:  Head: Normocephalic and atraumatic.  Mouth/Throat: No oropharyngeal exudate.  Eyes: Conjunctivae are normal. Pupils are equal, round, and reactive to light. No scleral icterus.  Neck: Normal range of motion. Neck supple. No JVD present.  Cardiovascular: Normal rate, regular rhythm and normal heart sounds.  Exam reveals no gallop and no friction rub.   No murmur heard. Pulmonary/Chest: Breath sounds normal. No respiratory distress. She has no wheezes. She has no rales.  Abdominal: Soft. Bowel sounds are normal. She exhibits no distension. There is no tenderness. There is no  guarding.  Musculoskeletal: She exhibits no edema or tenderness.  Lymphadenopathy:    She has no cervical adenopathy.  Neurological: She is alert and oriented to person, place, and time. No cranial nerve deficit.  Skin: Skin is warm and dry. No rash noted. No erythema. No pallor.  Psychiatric: Affect and judgment normal.    LABORATORY DATA:  CBC    Component Value Date/Time   WBC 7.3 08/02/2016 1102   RBC 4.52 08/02/2016 1102   HGB 11.9 (L) 08/02/2016 1102   HCT 39.4 08/02/2016 1102   HCT 34.8 04/06/2016 1442   PLT 214 08/02/2016 1102   PLT 374 04/06/2016 1442   MCV 87.2 08/02/2016 1102   MCV 85 04/06/2016 1442   MCH  26.3 08/02/2016 1102   MCHC 30.2 08/02/2016 1102   RDW 24.2 (H) 08/02/2016 1102   RDW 16.3 (H) 04/06/2016 1442   LYMPHSABS 2.2 08/02/2016 1102   LYMPHSABS 2.2 04/06/2016 1442   MONOABS 0.7 08/02/2016 1102   EOSABS 0.2 08/02/2016 1102   EOSABS 0.0 04/06/2016 1442   BASOSABS 0.1 08/02/2016 1102   BASOSABS 0.1 04/06/2016 1442    CMP     Component Value Date/Time   NA 137 08/02/2016 1102   NA 142 03/16/2016 1648   K 4.9 08/02/2016 1102   CL 103 08/02/2016 1102   CO2 24 08/02/2016 1102   GLUCOSE 81 08/02/2016 1102   BUN 14 08/02/2016 1102   BUN 6 (L) 03/16/2016 1648   CREATININE 0.74 08/02/2016 1102   CALCIUM 9.2 08/02/2016 1102   PROT 7.3 08/02/2016 1102   PROT 7.2 01/03/2016 1425   ALBUMIN 4.0 08/02/2016 1102   ALBUMIN 4.3 01/03/2016 1425   AST 22 08/02/2016 1102   ALT 16 08/02/2016 1102   ALKPHOS 70 08/02/2016 1102   BILITOT 0.4 08/02/2016 1102   BILITOT <0.2 01/03/2016 1425   GFRNONAA >60 08/02/2016 1102   GFRAA >60 08/02/2016 1102      ASSESSMENT:  Iron deficiency anemia with a ferritin of 7. S/p 1 dose of feraheme on 07/20/16. Rest of anemia workup negative except for iron deficiency.  PLAN: Patient has had improvement in her anemia since receiving iron infusion. I will follow up on her iron studies which are pending today.  The goal  is to get her ferritin up to 100. If it is less than 100 today, I will set her up for another dose of ferahme. RTC in 2 months for follow up with labs.    Orders Placed This Encounter  Procedures  . CBC with Differential    Standing Status:   Future    Number of Occurrences:   1    Standing Expiration Date:   08/02/2017  . Comprehensive metabolic panel    Standing Status:   Future    Number of Occurrences:   1    Standing Expiration Date:   08/02/2017  . Ferritin    Standing Status:   Future    Number of Occurrences:   1    Standing Expiration Date:   08/02/2017  . Iron and TIBC    Standing Status:   Future    Number of Occurrences:   1    Standing Expiration Date:   08/02/2017  . CBC with Differential    Standing Status:   Future    Standing Expiration Date:   08/02/2017  . Comprehensive metabolic panel    Standing Status:   Future    Standing Expiration Date:   08/02/2017  . Ferritin    Standing Status:   Future    Standing Expiration Date:   08/02/2017  . Iron and TIBC    Standing Status:   Future    Standing Expiration Date:   08/02/2017    All questions were answered. The patient knows to call the clinic with any problems, questions or concerns. We can certainly see the patient much sooner if necessary. This note was electronically signed. Twana First, MD 08/02/2016

## 2016-08-02 NOTE — Patient Instructions (Addendum)
[place] at [facility] Discharge Instructions  RECOMMENDATIONS MADE BY THE CONSULTANT AND ANY TEST RESULTS WILL BE SENT TO YOUR REFERRING PHYSICIAN.  You were seen today by Dr. Twana First Lab work today , we will call you with the results Follow up in 2 months with lab work   Thank you for choosing [place] at [facility] to provide your oncology and hematology care.  To afford each patient quality time with our provider, please arrive at least 15 minutes before your scheduled appointment time.    If you have a lab appointment with the [facility] please come in thru the  Main Entrance and check in at the main information desk  You need to re-schedule your appointment should you arrive 10 or more minutes late.  We strive to give you quality time with our providers, and arriving late affects you and other patients whose appointments are after yours.  Also, if you no show three or more times for appointments you may be dismissed from the clinic at the providers discretion.     Again, thank you for choosing [facility].  Our hope is that these requests will decrease the amount of time that you wait before being seen by our physicians.       _____________________________________________________________  Should you have questions after your visit to [facility], please contact our office at [phone] between the hours of 8:30 a.m. and 4:30 p.m.  Voicemails left after 4:30 p.m. will not be returned until the following business day.  For prescription refill requests, have your pharmacy contact our office.       Resources For Cancer Patients and their Caregivers ? American Cancer Society: Can assist with transportation, wigs, general needs, runs Look Good Feel Better.        581-425-2902 ? Cancer Care: Provides financial assistance, online support groups, medication/co-pay assistance.  1-800-813-HOPE  (403)613-6351) ? [place] Assists [place] cancer patients and their families through emotional , educational and financial support.  308-046-3550 ? [place] DSS Where to apply for food stamps, Medicaid and utility assistance. 301-764-3287 ? [employer]: Transportation to medical appointments. 819-115-1318 ? Social Security Administration: May apply for disability if have a Stage IV cancer. (579)347-7959 910 201 6134 ? [employer], Disability and Transit Services: Assists with nutrition, care and transit needs. [facility] Support Programs: @10RELATIVEDAYS @ > Cancer Support Group  2nd Tuesday of the month 1pm-2pm, Journey Room  > Creative Journey  3rd Tuesday of the month 1130am-1pm, Journey Room  > Look Good Feel Better  1st Wednesday of the month 10am-12 noon, Journey Room (Call [place] to register [phone])

## 2016-08-02 NOTE — Progress Notes (Signed)
Please call the patient and let her know her iron studies are normal and she does not need anymore IV iron at this time. TY

## 2016-08-04 ENCOUNTER — Ambulatory Visit (INDEPENDENT_AMBULATORY_CARE_PROVIDER_SITE_OTHER): Admitting: Family Medicine

## 2016-08-04 ENCOUNTER — Encounter: Payer: Self-pay | Admitting: Family Medicine

## 2016-08-04 VITALS — BP 129/76 | HR 80 | Temp 97.4°F | Ht 63.0 in | Wt 108.2 lb

## 2016-08-04 DIAGNOSIS — J449 Chronic obstructive pulmonary disease, unspecified: Secondary | ICD-10-CM

## 2016-08-04 DIAGNOSIS — R5383 Other fatigue: Secondary | ICD-10-CM | POA: Diagnosis not present

## 2016-08-04 DIAGNOSIS — G44219 Episodic tension-type headache, not intractable: Secondary | ICD-10-CM | POA: Diagnosis not present

## 2016-08-04 DIAGNOSIS — Z720 Tobacco use: Secondary | ICD-10-CM

## 2016-08-04 MED ORDER — TRAZODONE HCL 150 MG PO TABS: 150.0000 mg | ORAL_TABLET | Freq: Every day | ORAL | 3 refills | Status: DC

## 2016-08-04 NOTE — Patient Instructions (Signed)
Great to see you!  I have increased trazodone to 150 mg and [facility]

## 2016-08-04 NOTE — Progress Notes (Signed)
   HPI  Patient presents today here to follow-up for chronic medical conditions.  Headaches Patient states that she has a long history of headaches, many years ago she was started on trazodone to help with headaches. Previously she was taking 150 mg which helped her sleep better and help prevent headaches. She describes a current headache as lasting for than the last 3-4 days, in a bandlike distribution, and not helped by any usual medications. She does take hydrocodone and tizanidine for chronic back pain.  Fatigue Has been going on for at least 6 months. Chronic physical fatigue. Does not feel that it's related to depression. Has had anemia workup well recently, does not feel this is related as well.  Smoking Not ready to quit.  COPD Using spirival plus Symbicort, albuterol proximal to once daily.   PMH: Smoking status noted ROS: Per HPI  Objective: BP 129/76   Pulse 80   Temp 97.4 F (36.3 C) (Oral)   Ht 5\' 3"  (1.6 m)   Wt 108 lb 3.2 oz (49.1 kg)   BMI 19.17 kg/m  Gen: NAD, alert, cooperative with exam HEENT: NCAT CV: RRR, good S1/S2, no murmur Resp: CTABL, no wheezes, non-labored Ext: No edema, warm Neuro: Alert and oriented, No gross deficits  Assessment and plan:  # Headache Tension type headache, patient also feels that increasing trazodone may help. Titrated to 150 mg Muscle relaxers may also help.  # Fatigue Unclear etiology, angry her anemia is likely at her baseline and likely not the sole etiology. Patient not sleeping well, increased trazodone, continue to follow and monitor closely  # COPD Doing well, stable Using albuterol once daily. Continue controller inhalers, Symbicort plus spirva  # Tobacco abuse Recommended cessation, she is not ready, she is considering.   Meds ordered this encounter  Medications  . traZODone (DESYREL) 150 MG tablet    Sig: Take 1 tablet (150 mg total) by mouth at bedtime.    Dispense:  90 tablet    Refill:  [place], MD [facility] 08/04/2016, 1:34 PM

## 2016-08-07 NOTE — Addendum Note (Signed)
Addendum  created 08/07/16 1447 by Oleta Mouse, MD   Sign clinical note

## 2016-08-11 ENCOUNTER — Other Ambulatory Visit

## 2016-08-14 ENCOUNTER — Ambulatory Visit
Admission: RE | Admit: 2016-08-14 | Discharge: 2016-08-14 | Disposition: A | Discharge status: Home / Self Care | Source: Ambulatory Visit | Attending: Neurosurgery | Admitting: Neurosurgery

## 2016-08-14 DIAGNOSIS — M545 Low back pain: Principal | ICD-10-CM

## 2016-08-14 DIAGNOSIS — G8929 Other chronic pain: Secondary | ICD-10-CM

## 2016-08-14 MED ORDER — ONDANSETRON HCL 4 MG/2ML IJ SOLN
4.0000 mg | Freq: Once | INTRAMUSCULAR | Status: AC
  Administered 2016-08-14: 4 mg via INTRAMUSCULAR

## 2016-08-14 MED ORDER — IOPAMIDOL (ISOVUE-M 200) INJECTION 41%
15.0000 mL | Freq: Once | INTRAMUSCULAR | Status: AC
  Administered 2016-08-14: 15 mL via INTRATHECAL

## 2016-08-14 MED ORDER — MEPERIDINE HCL 100 MG/ML IJ SOLN
75.0000 mg | Freq: Once | INTRAMUSCULAR | Status: AC
  Administered 2016-08-14: 75 mg via INTRAMUSCULAR

## 2016-08-14 MED ORDER — ONDANSETRON HCL 4 MG/2ML IJ SOLN: 4.0000 mg | Freq: Four times a day (QID) | INTRAMUSCULAR | Status: DC | PRN

## 2016-08-14 MED ORDER — DIAZEPAM 5 MG PO TABS
10.0000 mg | ORAL_TABLET | Freq: Once | ORAL | Status: AC
  Administered 2016-08-14: 10 mg via ORAL

## 2016-08-14 NOTE — Progress Notes (Signed)
Pt states she has been off Trazodone, BC's, and Cymbalta for the past few days.

## 2016-08-14 NOTE — Discharge Instructions (Signed)
Myelogram Discharge Instructions  1. Go home and rest quietly for the next 24 hours.  It is important to lie flat for the next 24 hours.  Get up only to go to the restroom.  You may lie in the bed or on a couch on your back, your stomach, your left side or your right side.  You may have one pillow under your head.  You may have pillows between your knees while you are on your side or under your knees while you are on your back.  2. DO NOT drive today.  Recline the seat as far back as it will go, while still wearing your seat belt, on the way home.  3. You may get up to go to the bathroom as needed.  You may sit up for 10 minutes to eat.  You may resume your normal diet and medications unless otherwise indicated.  Drink plenty of extra fluids today and tomorrow.  4. The incidence of a spinal headache with nausea and/or vomiting is about 5% (one in 20 patients).  If you develop a headache, lie flat and drink plenty of fluids until the headache goes away.  Caffeinated beverages may be helpful.  If you develop severe nausea and vomiting or a headache that does not go away with flat bed rest, call [phone].  5. You may resume normal activities after your 24 hours of bed rest is over; however, do not exert yourself strongly or do any heavy lifting tomorrow.  6. Call your physician for a follow-up appointment.    You may resume BC Powders today.  You may resume Cymbalta and Desyrel/Trazodone on Tuesday, August 15, 2016 after 10:30a.m.

## 2016-08-16 ENCOUNTER — Other Ambulatory Visit: Payer: Self-pay | Admitting: Family Medicine

## 2016-08-16 ENCOUNTER — Encounter: Payer: Self-pay | Admitting: Family Medicine

## 2016-08-16 MED ORDER — PANTOPRAZOLE SODIUM 40 MG PO TBEC: 40.0000 mg | DELAYED_RELEASE_TABLET | Freq: Every day | ORAL | 2 refills | Status: DC

## 2016-08-21 ENCOUNTER — Encounter: Payer: Self-pay | Admitting: Family Medicine

## 2016-08-24 ENCOUNTER — Other Ambulatory Visit: Payer: Self-pay | Admitting: Neurosurgery

## 2016-08-25 ENCOUNTER — Encounter: Payer: Self-pay | Admitting: Family Medicine

## 2016-08-25 DIAGNOSIS — M7730 Calcaneal spur, unspecified foot: Secondary | ICD-10-CM

## 2016-08-28 MED ORDER — PREDNISONE 20 MG PO TABS: ORAL_TABLET | ORAL | 0 refills | Status: DC

## 2016-08-30 ENCOUNTER — Encounter: Admitting: *Deleted

## 2016-09-04 ENCOUNTER — Encounter (HOSPITAL_COMMUNITY): Payer: Self-pay | Admitting: *Deleted

## 2016-09-05 ENCOUNTER — Inpatient Hospital Stay (HOSPITAL_COMMUNITY): Admitting: Anesthesiology

## 2016-09-05 ENCOUNTER — Inpatient Hospital Stay (HOSPITAL_COMMUNITY)

## 2016-09-05 ENCOUNTER — Encounter (HOSPITAL_COMMUNITY): Payer: Self-pay | Admitting: Orthopedic Surgery

## 2016-09-05 ENCOUNTER — Encounter (HOSPITAL_COMMUNITY)
Admission: RE | Disposition: A | Discharge status: Home / Self Care | Payer: Self-pay | Source: Ambulatory Visit | Attending: Neurosurgery

## 2016-09-05 ENCOUNTER — Inpatient Hospital Stay (HOSPITAL_COMMUNITY)
Admission: RE | Admit: 2016-09-05 | Discharge: 2016-09-06 | DRG: 460 | Disposition: A | Discharge status: Home / Self Care | Source: Ambulatory Visit | Attending: Neurosurgery | Admitting: Neurosurgery

## 2016-09-05 DIAGNOSIS — Y838 Other surgical procedures as the cause of abnormal reaction of the patient, or of later complication, without mention of misadventure at the time of the procedure: Secondary | ICD-10-CM | POA: Diagnosis present

## 2016-09-05 DIAGNOSIS — Z888 Allergy status to other drugs, medicaments and biological substances status: Secondary | ICD-10-CM | POA: Diagnosis not present

## 2016-09-05 DIAGNOSIS — J449 Chronic obstructive pulmonary disease, unspecified: Secondary | ICD-10-CM | POA: Diagnosis present

## 2016-09-05 DIAGNOSIS — T84018A Broken internal joint prosthesis, other site, initial encounter: Secondary | ICD-10-CM | POA: Diagnosis present

## 2016-09-05 DIAGNOSIS — Y793 Surgical instruments, materials and orthopedic devices (including sutures) associated with adverse incidents: Secondary | ICD-10-CM | POA: Diagnosis present

## 2016-09-05 DIAGNOSIS — M79604 Pain in right leg: Secondary | ICD-10-CM | POA: Diagnosis present

## 2016-09-05 DIAGNOSIS — F17210 Nicotine dependence, cigarettes, uncomplicated: Secondary | ICD-10-CM | POA: Diagnosis present

## 2016-09-05 DIAGNOSIS — M960 Pseudarthrosis after fusion or arthrodesis: Secondary | ICD-10-CM | POA: Diagnosis present

## 2016-09-05 DIAGNOSIS — Z79899 Other long term (current) drug therapy: Secondary | ICD-10-CM

## 2016-09-05 DIAGNOSIS — Z7951 Long term (current) use of inhaled steroids: Secondary | ICD-10-CM

## 2016-09-05 DIAGNOSIS — S32009K Unspecified fracture of unspecified lumbar vertebra, subsequent encounter for fracture with nonunion: Secondary | ICD-10-CM | POA: Diagnosis present

## 2016-09-05 DIAGNOSIS — Z419 Encounter for procedure for purposes other than remedying health state, unspecified: Secondary | ICD-10-CM

## 2016-09-05 HISTORY — PX: HARDWARE REMOVAL: SHX979

## 2016-09-05 HISTORY — PX: APPLICATION OF ROBOTIC ASSISTANCE FOR SPINAL PROCEDURE: SHX6753

## 2016-09-05 HISTORY — DX: Personal history of other medical treatment: Z92.89

## 2016-09-05 LAB — BASIC METABOLIC PANEL
Anion gap: 13 (ref 5–15)
BUN: 10 mg/dL (ref 6–20)
CHLORIDE: 100 mmol/L — AB (ref 101–111)
CO2: 22 mmol/L (ref 22–32)
CREATININE: 0.83 mg/dL (ref 0.44–1.00)
Calcium: 9.5 mg/dL (ref 8.9–10.3)
GFR calc Af Amer: >60 mL/min (ref >60)
GFR calc non Af Amer: >60 mL/min (ref >60)
Glucose, Bld: 89 mg/dL (ref 65–99)
Potassium: 3.3 mmol/L — ABNORMAL LOW (ref 3.5–5.1)
SODIUM: 135 mmol/L (ref 135–145)

## 2016-09-05 LAB — TYPE AND SCREEN
ABO/RH(D): A POS
Antibody Screen: NEGATIVE

## 2016-09-05 LAB — CBC
HCT: 43.2 % (ref 36.0–46.0)
HEMOGLOBIN: 13.5 g/dL (ref 12.0–15.0)
MCH: 29.3 pg (ref 26.0–34.0)
MCHC: 31.3 g/dL (ref 30.0–36.0)
MCV: 93.9 fL (ref 78.0–100.0)
Platelets: 257 10*3/uL (ref 150–400)
RBC: 4.6 MIL/uL (ref 3.87–5.11)
RDW: 23.5 % — ABNORMAL HIGH (ref 11.5–15.5)
WBC: 10.5 10*3/uL (ref 4.0–10.5)

## 2016-09-05 LAB — SURGICAL PCR SCREEN
MRSA, PCR: NEGATIVE
STAPHYLOCOCCUS AUREUS: NEGATIVE

## 2016-09-05 SURGERY — REMOVAL, HARDWARE
Anesthesia: General

## 2016-09-05 MED ORDER — PHENOL 1.4 % MT LIQD: 1.0000 | OROMUCOSAL | Status: DC | PRN

## 2016-09-05 MED ORDER — CALCIUM CARBONATE 1250 (500 CA) MG PO TABS
1.0000 | ORAL_TABLET | Freq: Every day | ORAL | Status: DC
  Administered 2016-09-06: 500 mg via ORAL
  Filled 2016-09-05: qty 1

## 2016-09-05 MED ORDER — ROCURONIUM BROMIDE 100 MG/10ML IV SOLN
INTRAVENOUS | Status: DC | PRN
  Administered 2016-09-05: 50 mg via INTRAVENOUS

## 2016-09-05 MED ORDER — HEMOSTATIC AGENTS (NO CHARGE) OPTIME
TOPICAL | Status: DC | PRN
  Administered 2016-09-05: 1 via TOPICAL

## 2016-09-05 MED ORDER — SUFENTANIL CITRATE 50 MCG/ML IV SOLN
INTRAVENOUS | Status: DC | PRN
  Administered 2016-09-05 (×4): 5 ug via INTRAVENOUS
  Administered 2016-09-05: 10 ug via INTRAVENOUS

## 2016-09-05 MED ORDER — ONDANSETRON HCL 4 MG/2ML IJ SOLN: 4.0000 mg | Freq: Four times a day (QID) | INTRAMUSCULAR | Status: DC | PRN

## 2016-09-05 MED ORDER — ALBUTEROL SULFATE HFA 108 (90 BASE) MCG/ACT IN AERS
INHALATION_SPRAY | RESPIRATORY_TRACT | Status: AC
  Filled 2016-09-05: qty 6.7

## 2016-09-05 MED ORDER — ACETAMINOPHEN 650 MG RE SUPP: 650.0000 mg | RECTAL | Status: DC | PRN

## 2016-09-05 MED ORDER — FENTANYL CITRATE (PF) 100 MCG/2ML IJ SOLN
25.0000 ug | INTRAMUSCULAR | Status: DC | PRN
  Administered 2016-09-05 (×3): 50 ug via INTRAVENOUS

## 2016-09-05 MED ORDER — PANTOPRAZOLE SODIUM 40 MG PO TBEC
40.0000 mg | DELAYED_RELEASE_TABLET | Freq: Every day | ORAL | Status: DC
  Administered 2016-09-06: 40 mg via ORAL
  Filled 2016-09-05 (×2): qty 1

## 2016-09-05 MED ORDER — MORPHINE SULFATE (PF) 2 MG/ML IV SOLN: 2.0000 mg | INTRAVENOUS | Status: DC | PRN

## 2016-09-05 MED ORDER — METHOCARBAMOL 500 MG PO TABS
ORAL_TABLET | ORAL | Status: AC
  Filled 2016-09-05: qty 1

## 2016-09-05 MED ORDER — SODIUM CHLORIDE 0.9 % IV SOLN: 250.0000 mL | INTRAVENOUS | Status: DC

## 2016-09-05 MED ORDER — 0.9 % SODIUM CHLORIDE (POUR BTL) OPTIME
TOPICAL | Status: DC | PRN
  Administered 2016-09-05: 1000 mL

## 2016-09-05 MED ORDER — TRAMADOL HCL 50 MG PO TABS: 50.0000 mg | ORAL_TABLET | Freq: Two times a day (BID) | ORAL | Status: DC | PRN

## 2016-09-05 MED ORDER — PHENYLEPHRINE HCL 10 MG/ML IJ SOLN
INTRAMUSCULAR | Status: DC | PRN
  Administered 2016-09-05: 80 ug via INTRAVENOUS

## 2016-09-05 MED ORDER — TIZANIDINE HCL 4 MG PO TABS
6.0000 mg | ORAL_TABLET | Freq: Every day | ORAL | Status: DC
  Administered 2016-09-06: 6 mg via ORAL
  Filled 2016-09-05: qty 2

## 2016-09-05 MED ORDER — ONDANSETRON HCL 4 MG/2ML IJ SOLN
INTRAMUSCULAR | Status: DC | PRN
  Administered 2016-09-05: 4 mg via INTRAVENOUS

## 2016-09-05 MED ORDER — DEXAMETHASONE SODIUM PHOSPHATE 10 MG/ML IJ SOLN
INTRAMUSCULAR | Status: DC | PRN
  Administered 2016-09-05: 10 mg via INTRAVENOUS

## 2016-09-05 MED ORDER — HYDROCODONE BITARTRATE ER 40 MG PO T24A: 40.0000 mg | EXTENDED_RELEASE_TABLET | Freq: Every day | ORAL | Status: DC

## 2016-09-05 MED ORDER — ONDANSETRON HCL 4 MG PO TABS: 4.0000 mg | ORAL_TABLET | Freq: Four times a day (QID) | ORAL | Status: DC | PRN

## 2016-09-05 MED ORDER — SODIUM CHLORIDE 0.9 % IR SOLN
Status: DC | PRN
  Administered 2016-09-05: 10:00:00

## 2016-09-05 MED ORDER — THROMBIN 5000 UNITS EX SOLR
CUTANEOUS | Status: AC
  Filled 2016-09-05: qty 15000

## 2016-09-05 MED ORDER — IPRATROPIUM-ALBUTEROL 0.5-2.5 (3) MG/3ML IN SOLN: 3.0000 mL | Freq: Four times a day (QID) | RESPIRATORY_TRACT | Status: DC | PRN

## 2016-09-05 MED ORDER — GELATIN ABSORBABLE MT POWD
OROMUCOSAL | Status: DC | PRN
  Administered 2016-09-05: 11:00:00 via TOPICAL

## 2016-09-05 MED ORDER — LACTATED RINGERS IV SOLN
INTRAVENOUS | Status: DC | PRN
  Administered 2016-09-05 (×2): via INTRAVENOUS

## 2016-09-05 MED ORDER — THROMBIN 5000 UNITS EX SOLR
CUTANEOUS | Status: DC | PRN
  Administered 2016-09-05 (×2): 5000 [IU] via TOPICAL

## 2016-09-05 MED ORDER — SENNA 8.6 MG PO TABS
1.0000 | ORAL_TABLET | Freq: Two times a day (BID) | ORAL | Status: DC
  Administered 2016-09-05 – 2016-09-06 (×2): 8.6 mg via ORAL
  Filled 2016-09-05 (×2): qty 1

## 2016-09-05 MED ORDER — MORPHINE SULFATE (PF) 4 MG/ML IV SOLN
2.0000 mg | INTRAVENOUS | Status: DC | PRN
  Administered 2016-09-05 (×3): 2 mg via INTRAVENOUS
  Filled 2016-09-05 (×3): qty 1

## 2016-09-05 MED ORDER — MENTHOL 3 MG MT LOZG: 1.0000 | LOZENGE | OROMUCOSAL | Status: DC | PRN

## 2016-09-05 MED ORDER — MUPIROCIN 2 % EX OINT
1.0000 "application " | TOPICAL_OINTMENT | Freq: Once | CUTANEOUS | Status: AC
  Administered 2016-09-05: 1 via TOPICAL
  Filled 2016-09-05: qty 22

## 2016-09-05 MED ORDER — MOMETASONE FURO-FORMOTEROL FUM 200-5 MCG/ACT IN AERO
2.0000 | INHALATION_SPRAY | Freq: Two times a day (BID) | RESPIRATORY_TRACT | Status: DC
  Administered 2016-09-05 – 2016-09-06 (×2): 2 via RESPIRATORY_TRACT
  Filled 2016-09-05: qty 8.8

## 2016-09-05 MED ORDER — SODIUM CHLORIDE 0.9% FLUSH: 3.0000 mL | INTRAVENOUS | Status: DC | PRN

## 2016-09-05 MED ORDER — METOCLOPRAMIDE HCL 5 MG/ML IJ SOLN: 10.0000 mg | Freq: Once | INTRAMUSCULAR | Status: DC | PRN

## 2016-09-05 MED ORDER — LIDOCAINE HCL (CARDIAC) 20 MG/ML IV SOLN
INTRAVENOUS | Status: DC | PRN
  Administered 2016-09-05: 50 mg via INTRAVENOUS

## 2016-09-05 MED ORDER — GUAIFENESIN ER 600 MG PO TB12
600.0000 mg | ORAL_TABLET | Freq: Two times a day (BID) | ORAL | Status: DC | PRN
  Administered 2016-09-05 – 2016-09-06 (×2): 600 mg via ORAL
  Filled 2016-09-05 (×3): qty 1

## 2016-09-05 MED ORDER — MEPERIDINE HCL 25 MG/ML IJ SOLN: 6.2500 mg | INTRAMUSCULAR | Status: DC | PRN

## 2016-09-05 MED ORDER — ACETAMINOPHEN 325 MG PO TABS
650.0000 mg | ORAL_TABLET | ORAL | Status: DC | PRN
  Administered 2016-09-05: 650 mg via ORAL
  Filled 2016-09-05: qty 2

## 2016-09-05 MED ORDER — TRAZODONE HCL 150 MG PO TABS
150.0000 mg | ORAL_TABLET | Freq: Every day | ORAL | Status: DC
  Administered 2016-09-05: 150 mg via ORAL
  Filled 2016-09-05: qty 1

## 2016-09-05 MED ORDER — DULOXETINE HCL 30 MG PO CPEP
30.0000 mg | ORAL_CAPSULE | Freq: Three times a day (TID) | ORAL | Status: DC
  Administered 2016-09-05 – 2016-09-06 (×3): 30 mg via ORAL
  Filled 2016-09-05 (×3): qty 1

## 2016-09-05 MED ORDER — SODIUM CHLORIDE 0.9% FLUSH
3.0000 mL | Freq: Two times a day (BID) | INTRAVENOUS | Status: DC
  Administered 2016-09-05: 3 mL via INTRAVENOUS

## 2016-09-05 MED ORDER — PROPOFOL 10 MG/ML IV BOLUS
INTRAVENOUS | Status: DC | PRN
  Administered 2016-09-05: 130 mg via INTRAVENOUS

## 2016-09-05 MED ORDER — BISACODYL 10 MG RE SUPP: 10.0000 mg | Freq: Every day | RECTAL | Status: DC | PRN

## 2016-09-05 MED ORDER — VANCOMYCIN HCL IN DEXTROSE 750-5 MG/150ML-% IV SOLN
750.0000 mg | Freq: Once | INTRAVENOUS | Status: AC
  Administered 2016-09-05: 750 mg via INTRAVENOUS
  Filled 2016-09-05: qty 150

## 2016-09-05 MED ORDER — OXYCODONE HCL 5 MG PO TABS
ORAL_TABLET | ORAL | Status: AC
  Filled 2016-09-05: qty 2

## 2016-09-05 MED ORDER — FENTANYL CITRATE (PF) 100 MCG/2ML IJ SOLN
INTRAMUSCULAR | Status: AC
  Administered 2016-09-05: 50 ug via INTRAVENOUS
  Filled 2016-09-05: qty 2

## 2016-09-05 MED ORDER — LIDOCAINE HCL (CARDIAC) 20 MG/ML IV SOLN
INTRAVENOUS | Status: AC
  Filled 2016-09-05: qty 5

## 2016-09-05 MED ORDER — PHENYLEPHRINE HCL 10 MG/ML IJ SOLN
INTRAMUSCULAR | Status: DC | PRN
  Administered 2016-09-05: 25 ug/min via INTRAVENOUS

## 2016-09-05 MED ORDER — L-METHYLFOLATE-B6-B12 3-35-2 MG PO TABS
1.0000 | ORAL_TABLET | Freq: Two times a day (BID) | ORAL | Status: DC
  Administered 2016-09-05 – 2016-09-06 (×2): 1 via ORAL
  Filled 2016-09-05 (×2): qty 1

## 2016-09-05 MED ORDER — METHOCARBAMOL 500 MG PO TABS
500.0000 mg | ORAL_TABLET | Freq: Four times a day (QID) | ORAL | Status: DC | PRN
  Administered 2016-09-05 – 2016-09-06 (×4): 500 mg via ORAL
  Filled 2016-09-05 (×3): qty 1

## 2016-09-05 MED ORDER — ONDANSETRON HCL 4 MG/2ML IJ SOLN
INTRAMUSCULAR | Status: AC
  Filled 2016-09-05: qty 2

## 2016-09-05 MED ORDER — KETOROLAC TROMETHAMINE 15 MG/ML IJ SOLN
15.0000 mg | Freq: Four times a day (QID) | INTRAMUSCULAR | Status: DC
  Administered 2016-09-05 – 2016-09-06 (×2): 15 mg via INTRAVENOUS
  Filled 2016-09-05 (×2): qty 1

## 2016-09-05 MED ORDER — SUGAMMADEX SODIUM 200 MG/2ML IV SOLN
INTRAVENOUS | Status: DC | PRN
  Administered 2016-09-05: 110 mg via INTRAVENOUS

## 2016-09-05 MED ORDER — BUPIVACAINE HCL (PF) 0.5 % IJ SOLN
INTRAMUSCULAR | Status: AC
  Filled 2016-09-05: qty 30

## 2016-09-05 MED ORDER — AMLODIPINE BESYLATE 10 MG PO TABS
10.0000 mg | ORAL_TABLET | Freq: Every day | ORAL | Status: DC
  Administered 2016-09-06: 10 mg via ORAL
  Filled 2016-09-05: qty 1

## 2016-09-05 MED ORDER — LORATADINE 10 MG PO TABS: 10.0000 mg | ORAL_TABLET | Freq: Every day | ORAL | Status: DC | PRN

## 2016-09-05 MED ORDER — KETAMINE INJECTION FOR OPTIME (MG/KG/HR) DOCUMENTATION
INTRAVENOUS | Status: DC | PRN
  Administered 2016-09-05: 10 mg via INTRAVENOUS
  Administered 2016-09-05: 30 mg via INTRAVENOUS
  Administered 2016-09-05: 10 mg via INTRAVENOUS

## 2016-09-05 MED ORDER — OXYCODONE HCL 5 MG PO TABS
5.0000 mg | ORAL_TABLET | ORAL | Status: DC | PRN
  Administered 2016-09-05 – 2016-09-06 (×7): 10 mg via ORAL
  Filled 2016-09-05 (×6): qty 2

## 2016-09-05 MED ORDER — PANTOPRAZOLE SODIUM 40 MG IV SOLR: 40.0000 mg | Freq: Every day | INTRAVENOUS | Status: DC

## 2016-09-05 MED ORDER — ACETAMINOPHEN 10 MG/ML IV SOLN
INTRAVENOUS | Status: AC
  Filled 2016-09-05: qty 100

## 2016-09-05 MED ORDER — DOCUSATE SODIUM 100 MG PO CAPS
100.0000 mg | ORAL_CAPSULE | Freq: Two times a day (BID) | ORAL | Status: DC
  Administered 2016-09-05 – 2016-09-06 (×2): 100 mg via ORAL
  Filled 2016-09-05 (×2): qty 1

## 2016-09-05 MED ORDER — ALBUTEROL SULFATE (2.5 MG/3ML) 0.083% IN NEBU: 3.0000 mL | INHALATION_SOLUTION | Freq: Four times a day (QID) | RESPIRATORY_TRACT | Status: DC | PRN

## 2016-09-05 MED ORDER — ACETAMINOPHEN 10 MG/ML IV SOLN
INTRAVENOUS | Status: DC | PRN
  Administered 2016-09-05: 1000 mg via INTRAVENOUS

## 2016-09-05 MED ORDER — CHLORHEXIDINE GLUCONATE CLOTH 2 % EX PADS: 6.0000 | MEDICATED_PAD | Freq: Once | CUTANEOUS | Status: DC

## 2016-09-05 MED ORDER — SODIUM CHLORIDE 0.9 % IV SOLN
INTRAVENOUS | Status: DC
  Administered 2016-09-05: 16:00:00 via INTRAVENOUS

## 2016-09-05 MED ORDER — DEXAMETHASONE SODIUM PHOSPHATE 10 MG/ML IJ SOLN
INTRAMUSCULAR | Status: AC
  Filled 2016-09-05: qty 1

## 2016-09-05 MED ORDER — SUGAMMADEX SODIUM 200 MG/2ML IV SOLN
INTRAVENOUS | Status: AC
  Filled 2016-09-05: qty 2

## 2016-09-05 MED ORDER — KETAMINE HCL-SODIUM CHLORIDE 100-0.9 MG/10ML-% IV SOSY
PREFILLED_SYRINGE | INTRAVENOUS | Status: AC
  Filled 2016-09-05: qty 10

## 2016-09-05 MED ORDER — PROPOFOL 10 MG/ML IV BOLUS
INTRAVENOUS | Status: AC
  Filled 2016-09-05: qty 40

## 2016-09-05 MED ORDER — VANCOMYCIN HCL IN DEXTROSE 1-5 GM/200ML-% IV SOLN
1000.0000 mg | INTRAVENOUS | Status: AC
  Administered 2016-09-05: 1000 mg via INTRAVENOUS
  Filled 2016-09-05: qty 200

## 2016-09-05 MED ORDER — METHOCARBAMOL 1000 MG/10ML IJ SOLN
500.0000 mg | Freq: Four times a day (QID) | INTRAMUSCULAR | Status: DC | PRN
  Filled 2016-09-05: qty 5

## 2016-09-05 MED ORDER — LACTATED RINGERS IV SOLN: INTRAVENOUS | Status: DC

## 2016-09-05 MED ORDER — SUFENTANIL CITRATE 50 MCG/ML IV SOLN
INTRAVENOUS | Status: AC
  Filled 2016-09-05: qty 1

## 2016-09-05 MED ORDER — MIDAZOLAM HCL 5 MG/5ML IJ SOLN
INTRAMUSCULAR | Status: DC | PRN
  Administered 2016-09-05: 2 mg via INTRAVENOUS

## 2016-09-05 MED ORDER — MIDAZOLAM HCL 2 MG/2ML IJ SOLN
INTRAMUSCULAR | Status: AC
  Filled 2016-09-05: qty 2

## 2016-09-05 MED ORDER — PHENYLEPHRINE 40 MCG/ML (10ML) SYRINGE FOR IV PUSH (FOR BLOOD PRESSURE SUPPORT)
PREFILLED_SYRINGE | INTRAVENOUS | Status: AC
  Filled 2016-09-05: qty 10

## 2016-09-05 MED ORDER — LACTATED RINGERS IV SOLN
INTRAVENOUS | Status: DC
  Administered 2016-09-05: 09:00:00 via INTRAVENOUS

## 2016-09-05 MED ORDER — LIDOCAINE-EPINEPHRINE 1 %-1:100000 IJ SOLN
INTRAMUSCULAR | Status: AC
  Filled 2016-09-05: qty 1

## 2016-09-05 SURGICAL SUPPLY — 80 items
BAG DECANTER FOR FLEXI CONT (MISCELLANEOUS) ×2 IMPLANT
BENZOIN TINCTURE PRP APPL 2/3 (GAUZE/BANDAGES/DRESSINGS) IMPLANT
BIT DRILL LONG 3.0X30 (BIT) IMPLANT
BIT DRILL LONG 3X80 (BIT) IMPLANT
BIT DRILL LONG 4X80 (BIT) IMPLANT
BIT DRILL SHORT 3.0X30 (BIT) ×2 IMPLANT
BIT DRILL SHORT 3X80 (BIT) IMPLANT
BLADE CLIPPER SURG (BLADE) IMPLANT
BLADE SURG 11 STRL SS (BLADE) ×2 IMPLANT
BUR MATCHSTICK NEURO 3.0 LAGG (BURR) ×4 IMPLANT
CANISTER SUCT 3000ML PPV (MISCELLANEOUS) ×2 IMPLANT
CARTRIDGE OIL MAESTRO DRILL (MISCELLANEOUS) ×1 IMPLANT
DECANTER SPIKE VIAL GLASS SM (MISCELLANEOUS) ×2 IMPLANT
DERMABOND ADVANCED (GAUZE/BANDAGES/DRESSINGS) ×2
DERMABOND ADVANCED .7 DNX12 (GAUZE/BANDAGES/DRESSINGS) ×2 IMPLANT
DIFFUSER DRILL AIR PNEUMATIC (MISCELLANEOUS) ×2 IMPLANT
DRAPE C-ARM 35X43 STRL (DRAPES) ×2 IMPLANT
DRAPE C-ARMOR (DRAPES) ×2 IMPLANT
DRAPE LAPAROTOMY 100X72X124 (DRAPES) ×2 IMPLANT
DRAPE MICROSCOPE LEICA (MISCELLANEOUS) IMPLANT
DRAPE POUCH INSTRU U-SHP 10X18 (DRAPES) ×2 IMPLANT
DRAPE SHEET LG 3/4 BI-LAMINATE (DRAPES) ×2 IMPLANT
DRAPE SURG 17X23 STRL (DRAPES) ×2 IMPLANT
DRSG OPSITE POSTOP 3X4 (GAUZE/BANDAGES/DRESSINGS) ×2 IMPLANT
DRSG OPSITE POSTOP 4X8 (GAUZE/BANDAGES/DRESSINGS) ×2 IMPLANT
DURAPREP 26ML APPLICATOR (WOUND CARE) ×2 IMPLANT
ELECT REM PT RETURN 9FT ADLT (ELECTROSURGICAL) ×2
ELECTRODE REM PT RTRN 9FT ADLT (ELECTROSURGICAL) ×1 IMPLANT
GAUZE SPONGE 4X4 12PLY STRL (GAUZE/BANDAGES/DRESSINGS) IMPLANT
GAUZE SPONGE 4X4 16PLY XRAY LF (GAUZE/BANDAGES/DRESSINGS) IMPLANT
GLOVE BIO SURGEON STRL SZ7 (GLOVE) IMPLANT
GLOVE BIOGEL PI IND STRL 7.0 (GLOVE) IMPLANT
GLOVE BIOGEL PI IND STRL 7.5 (GLOVE) ×1 IMPLANT
GLOVE BIOGEL PI INDICATOR 7.0 (GLOVE)
GLOVE BIOGEL PI INDICATOR 7.5 (GLOVE) ×1
GLOVE ECLIPSE 7.0 STRL STRAW (GLOVE) ×2 IMPLANT
GLOVE EXAM NITRILE LRG STRL (GLOVE) IMPLANT
GLOVE EXAM NITRILE XL STR (GLOVE) IMPLANT
GLOVE EXAM NITRILE XS STR PU (GLOVE) IMPLANT
GOWN STRL REUS W/ TWL LRG LVL3 (GOWN DISPOSABLE) ×2 IMPLANT
GOWN STRL REUS W/ TWL XL LVL3 (GOWN DISPOSABLE) ×1 IMPLANT
GOWN STRL REUS W/TWL 2XL LVL3 (GOWN DISPOSABLE) ×2 IMPLANT
GOWN STRL REUS W/TWL LRG LVL3 (GOWN DISPOSABLE) ×2
GOWN STRL REUS W/TWL XL LVL3 (GOWN DISPOSABLE) ×1
GRAFT BN 5X1XSPNE CVD POST DBM (Bone Implant) ×1 IMPLANT
GRAFT BONE MAGNIFUSE 1X5CM (Bone Implant) ×1 IMPLANT
GUIDEWIRE 18IN BLUNT CD HORIZ (WIRE) ×2 IMPLANT
HEMOSTAT POWDER KIT SURGIFOAM (HEMOSTASIS) ×2 IMPLANT
KIT BASIN OR (CUSTOM PROCEDURE TRAY) ×2 IMPLANT
KIT ROOM TURNOVER OR (KITS) ×2 IMPLANT
KIT SPINE MAZOR X ROBO DISP (MISCELLANEOUS) ×2 IMPLANT
NEEDLE HYPO 18GX1.5 BLUNT FILL (NEEDLE) IMPLANT
NEEDLE HYPO 25X1 1.5 SAFETY (NEEDLE) ×2 IMPLANT
NEEDLE SPNL 18GX3.5 QUINCKE PK (NEEDLE) IMPLANT
NS IRRIG 1000ML POUR BTL (IV SOLUTION) ×2 IMPLANT
OIL CARTRIDGE MAESTRO DRILL (MISCELLANEOUS) ×2
PACK LAMINECTOMY NEURO (CUSTOM PROCEDURE TRAY) ×2 IMPLANT
PAD ARMBOARD 7.5X6 YLW CONV (MISCELLANEOUS) ×6 IMPLANT
PIN HEAD 2.5X60MM (PIN) IMPLANT
ROD SOLERA 70MM (Rod) ×2 IMPLANT
ROD SOLERA 70X4.75X (Rod) ×2 IMPLANT
RUBBERBAND STERILE (MISCELLANEOUS) IMPLANT
SCREW SCHANZ SA 4.0MM (MISCELLANEOUS) IMPLANT
SCREW SET SOLERA (Screw) ×6 IMPLANT
SCREW SET SOLERA TI (Screw) ×6 IMPLANT
SCREW SOLERA 40X6.5XMA NS SPNE (Screw) ×1 IMPLANT
SCREW SOLERA 6.5X35 (Screw) ×2 IMPLANT
SCREW SOLERA 6.5X40MM (Screw) ×1 IMPLANT
SPONGE LAP 4X18 X RAY DECT (DISPOSABLE) IMPLANT
SPONGE SURGIFOAM ABS GEL SZ50 (HEMOSTASIS) ×2 IMPLANT
STRIP CLOSURE SKIN 1/2X4 (GAUZE/BANDAGES/DRESSINGS) IMPLANT
SUT VIC AB 0 CT1 18XCR BRD8 (SUTURE) ×1 IMPLANT
SUT VIC AB 0 CT1 8-18 (SUTURE) ×1
SUT VIC AB 2-0 CT1 18 (SUTURE) IMPLANT
SUT VICRYL 3-0 RB1 18 ABS (SUTURE) ×4 IMPLANT
SYR 3ML LL SCALE MARK (SYRINGE) IMPLANT
TOWEL GREEN STERILE (TOWEL DISPOSABLE) ×2 IMPLANT
TOWEL GREEN STERILE FF (TOWEL DISPOSABLE) ×2 IMPLANT
TUBE MAZOR SA REDUCTION (TUBING) ×4 IMPLANT
WATER STERILE IRR 1000ML POUR (IV SOLUTION) ×2 IMPLANT

## 2016-09-05 NOTE — Op Note (Signed)
PREOP DIAGNOSIS:  1. Hardware failure 2. Pseudoarthrosis, L4-5 3. Lumbago   POSTOP DIAGNOSIS: Same  PROCEDURE: 1. Removal of previous L5 screws, rod 2. Exploration of fusion, L4-5 3. Placement of L5 pedicle screws, Medtronic Solera 6.5 x 35 x 2 4. Posterolateral fusion, L4-5 5. Use of morcellized bone allograft, Magnifuse 5cm bag x 2  SURGEON: Dr. Consuella Lose, MD  ASSISTANT: Dr. Earnie Larsson, MD  ANESTHESIA: General Endotracheal  EBL: 100cc  SPECIMENS: None  DRAINS: None  COMPLICATIONS: None immediate  CONDITION: Stable to PACU  HISTORY: Jasmine [name] is a 61 y.o. female who initially were not lumbar fusion at L3-4 and L4-5 approximate 9 months ago. She did well initially, but began complaining of sudden onset severe right-sided back and leg pain. Workup included x-rays and CT myelogram which demonstrated fracture of the right L5 screw. There is no convincing evidence of interbody or posterior lateral fusion at this level. She therefore elected to undergo revision of the hardware. Risks and benefits of the surgery were reviewed in detail with the patient and family. After all questions were answered, informed consent was obtained and witnessed.  PROCEDURE IN DETAIL: After informed consent was obtained and witnessed, the patient was brought to the operating room. After induction of general anesthesia, the patient was positioned on the operative table in the prone position. All pressure points were meticulously padded. Skin incision was then marked out and prepped and draped in the usual sterile fashion.  After timeout was conducted, the previous incision was opened sharply. Bovie electrocautery was used to dissect the subcutaneous tissue in the lumbodorsal fascia was identified and incised in the midline. The spinous process at L3 was identified, and dissection was carried out laterally on both sides until the t[place]ips for bilateral L3, L4 and L5 pedicle screws were identified.  Further dissection laterally was carried out at L4 and L5. There is no convincing evidence of posterior lateral fusion.  Schanz pin was placed in the left PSIS, and intraoperative x-ray was used to register with the preoperative CT myelogram. Satisfactory accuracy was achieved. At this point, the setscrews were then removed, and the rod was removed. The right L5 to lip was removed and confirmed to be fractured. The left L5 screw was also found to be fairly loose in its trajectory, and removed. New pedicle screw trajectory using a standard approach was placed with robotic assistance on the right-sided L5. K wire was placed, tapped to 5.5 mm x 35 mm, and a new standard trajectory pedicle screw was placed. A new 6.5 x 35 mm screw was also placed in the left L5 cortical trajectory. Position was confirmed with AP and lateral fluoroscopy.  At this point, the exposed sacral alar, lateral facet complex, was all decorticated in preparation for fusion. 70 mm rods were placed bilaterally, and setscrews were placed and final tightened. The 5 cm bags of Magnifuse were then placed in the posterolateral gutters at L4-5. Final AP and lateral fluoroscopic images were taken in hardware appeared to be in good position. Self-retaining retractors were then removed.  The wound was then closed in multiple layers in standard fashion using a combination of 0 and 3-0 Vicryl stitches. Dermabond was applied to the skin. The Schanz screw was removed, and the stab incision closed. Sterile dressing was then applied. The patient was then transferred to the stretcher and taken to the PACU  in stable hemodynamic condition.  At the end of the case all sponge, needle, and instrument counts were  correct.

## 2016-09-05 NOTE — Progress Notes (Signed)
Pharmacy Antibiotic Note  Jasmine [name] is a 61 y.o. female admitted on 09/05/2016 with surgical prophylaxis.  Pharmacy has been consulted for vancomycin dosing.  Patient received pre-op vancomycin 1g IV at 1150 this morning. She does not have a drain in place.  Plan: Vancomycin 750mg  IV x1 at 2300 Pharmacy to sign off as no future doses required    Temp (24hrs), Avg:98.2 F (36.8 C), Min:97.9 F (36.6 C), Max:98.5 F (36.9 C)   Recent Labs Lab 09/05/16 0813  WBC 10.5  CREATININE 0.83    CrCl cannot be calculated (Unknown ideal weight.).    Allergies  Allergen Reactions  . Lyrica [Pregabalin] Other (See Comments)    EXTREME SHAKING/TREMBLING  . Darvon [Propoxyphene]     UNSPECIFIED REACTION   . Gabapentin     Extreme shaking and trembling   . Augmentin [Amoxicillin-Pot Clavulanate] Nausea And Vomiting    Has patient had a PCN reaction causing immediate rash, facial/tongue/throat swelling, SOB or lightheadedness with hypotension:no Has patient had a PCN reaction causing severe rash involving mucus membranes or skin necrosis:No Has patient had a PCN reaction that required hospitalization:No Has patient had a PCN reaction occurring within the last 10 years:Yes--ONLY N/V If all of the above answers are "NO", then may proceed with Cephalosporin use.   . Erythromycin Itching and Rash       . Vibramycin [Doxycycline Calcium] Itching and Rash    "[place] "      Thank you for allowing pharmacy to be a part of this patient's care.  Chiquetta Langner 09/05/2016 3:57 PM

## 2016-09-05 NOTE — Transfer of Care (Signed)
Immediate Anesthesia Transfer of Care Note  Patient: Jasmine [name]  Procedure(s) Performed: Procedure(s): HARDWARE REMOVAL AND REPLACEMENT OF LUMBAR FIVE SCREWS (N/A) APPLICATION OF ROBOTIC ASSISTANCE FOR SPINAL PROCEDURE (N/A)  Patient Location: PACU  Anesthesia Type:General  Level of Consciousness: awake and alert   Airway & Oxygen Therapy: Patient Spontanous Breathing and Patient connected to nasal cannula oxygen  Post-op Assessment: Report given to RN and Post -op Vital signs reviewed and stable  Post vital signs: Reviewed and stable  Last Vitals:  Vitals:   09/05/16 0816  BP: (!) 156/73  Pulse: 88  Resp: 20  Temp: 36.9 C    Last Pain:  Vitals:   09/05/16 0847  TempSrc:   PainSc: 7       Patients Stated Pain Goal: 6 (64/[birth year] 0940)  Complications: No apparent anesthesia complications

## 2016-09-05 NOTE — H&P (Signed)
CC:  Right side back/leg pain  HPI: Jasmine [name] is a 61 year old woman I am seeing in follow-up. She underwent revision of L3-4 hardware and extension to L4-5 about 9 months ago. She initially actually had a significant amount of improvement in her back and leg pain. Unfortunately, a few months ago she had sudden onset of right-sided buttock and leg pain. She says the pain occurs whenever she is moving. It is okay when she is stationary. At our last visit x-rays were done which suggested fracture of 1 or both of the L5 screws. 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   PMH: Past Medical History:  Diagnosis Date  . Allergy   . Anemia   . Anxiety   . Arthritis   . Asthma   . Carpal tunnel syndrome    bilateral  . Complication of anesthesia    in the past has had N/V, the last few surgeries she has not been  . COPD (chronic obstructive pulmonary disease) ([name])   . Easy bruising 07/10/2016  . GERD (gastroesophageal reflux disease)   . Headache   . Heart murmur 2005   never had any problems  . History of blood transfusion 2018  . History of kidney stones   . Hypertension   . Hypoglycemia   . Hypoglycemia   . Iron deficiency anemia 07/11/2016  . Neuromuscular disorder ([place])    pt denies  . Neuropathy    both arms  . Normocytic anemia 07/29/2015  . Pneumonia   . PONV (postoperative nausea and vomiting)   . Sciatica   . Seizures ([place])    as a child when she would have an asthma attack. none as an adult  . Shortness of breath dyspnea     PSH: Past Surgical History:  Procedure Laterality Date  . ABDOMINAL HYSTERECTOMY  2005  . CERVICAL FUSION    . [place]  . COLONOSCOPY    . cyst removal face    . disk repair     neck and lumbar region  . FOOT SURGERY Bilateral    to file bones down   . IMPLANTATION / PLACEMENT EPIDURAL NEUROSTIMULATOR ELECTRODES    .  LAMINECTOMY     2006  . LUMBAR FUSION    . SPINAL CORD STIMULATOR BATTERY EXCHANGE    . SPINAL CORD STIMULATOR INSERTION N/A 07/05/2016   Procedure: REPLACEMENT OF LUMBAR SPINAL CORD STIMULATOR BATTERY;  Surgeon: Newman Pies, MD;  Location: [facility];  Service: Neurosurgery;  Laterality: N/A;  . TONSILLECTOMY      SH: Social History  Substance Use Topics  . Smoking status: Current Every Day Smoker    Packs/day: 0.50    Years: 35.00    Types: Cigarettes  . Smokeless tobacco: Never Used     Comment: trying to quit  . Alcohol use 1.2 oz/week    2 Glasses of wine per week     Comment: occasionally    MEDS: Prior to Admission medications   Medication Sig Start Date End Date Taking? Authorizing Provider  albuterol (PROVENTIL HFA;VENTOLIN HFA) 108 (90 Base) MCG/ACT inhaler Inhale 2 puffs into the lungs every 6 (six) hours as needed for wheezing or shortness of breath. 07/19/16  Yes Timmothy Euler, MD  amLODipine (NORVASC) 10 MG tablet Take 1 tablet (10 mg total) by mouth daily. 01/03/16  Yes Timmothy Euler,  MD  Aspirin-Salicylamide-Caffeine (BC HEADACHE POWDER PO) Take 1 packet by mouth daily as needed (headaches).    Yes [provider]  budesonide-formoterol (SYMBICORT) 160-4.5 MCG/ACT inhaler Inhale 2 puffs into the lungs 2 (two) times daily. 09/02/15  Yes Timmothy Euler, MD  calcium carbonate (OSCAL) 1500 (600 Ca) MG TABS tablet Take 600 mg by mouth daily.    Yes [provider]  diclofenac sodium (VOLTAREN) 1 % GEL Apply 1 application topically daily as needed for muscle pain. 06/30/16  Yes [provider]  DULoxetine (CYMBALTA) 30 MG capsule Take 3 capsules (90 mg total) by mouth daily. Patient taking differently: Take 30 mg by mouth 3 (three) times daily.  07/29/15  Yes Timmothy Euler, MD  guaiFENesin (MUCINEX) 600 MG 12 hr tablet Take 600 mg by mouth 2 (two) times daily as needed (congestion).    Yes [provider]  HYSINGLA ER 40 MG  T24A Take 40 mg by mouth daily. 08/16/16  Yes [provider]  ipratropium-albuterol (DUONEB) 0.5-2.5 (3) MG/3ML SOLN Inhale 3 mLs into the lungs 4 (four) times daily as needed (for shortness of breath/wheezing). *May use as needed 02/21/16  Yes [provider]  l-methylfolate-B6-B12 (METANX) 3-35-2 MG TABS tablet Take 1 tablet by mouth 2 (two) times daily. 12/13/15  Yes Timmothy Euler, MD  pantoprazole (PROTONIX) 40 MG tablet TAKE 1 TABLET DAILY 08/17/16  Yes Timmothy Euler, MD  tizanidine (ZANAFLEX) 6 MG capsule Take 6 mg by mouth daily.    Yes [provider]  traMADol (ULTRAM) 50 MG tablet Take 50 mg by mouth 2 (two) times daily as needed for moderate pain.   Yes [provider]  traZODone (DESYREL) 150 MG tablet Take 1 tablet (150 mg total) by mouth at bedtime. 08/04/16  Yes Timmothy Euler, MD  cetirizine (ZYRTEC) 10 MG tablet Take 10 mg by mouth daily as needed for allergies. Reported on 03/12/2015    [provider]  predniSONE (DELTASONE) 20 MG tablet 2 po at same time daily for 5 days Patient not taking: Reported on 08/31/2016 08/28/16   Timmothy Euler, MD  tiotropium (SPIRIVA HANDIHALER) 18 MCG inhalation capsule Place 1 capsule (18 mcg total) into inhaler and inhale daily. Patient not taking: Reported on 08/31/2016 06/22/16   Timmothy Euler, MD    ALLERGY: Allergies  Allergen Reactions  . Lyrica [Pregabalin] Other (See Comments)    EXTREME SHAKING/TREMBLING  . Darvon [Propoxyphene]     UNSPECIFIED REACTION   . Gabapentin     Extreme shaking and trembling   . Augmentin [Amoxicillin-Pot Clavulanate] Nausea And Vomiting    Has patient had a PCN reaction causing immediate rash, facial/tongue/throat swelling, SOB or lightheadedness with hypotension:no Has patient had a PCN reaction causing severe rash involving mucus membranes or skin necrosis:No Has patient had a PCN reaction that required hospitalization:No Has patient had a PCN  reaction occurring within the last 10 years:Yes--ONLY N/V If all of the above answers are "NO", then may proceed with Cephalosporin use.   . Erythromycin Itching and Rash       . Vibramycin [Doxycycline Calcium] Itching and Rash    "[place] "    ROS: ROS  NEUROLOGIC EXAM: Awake, alert, oriented Memory and concentration grossly intact Speech fluent, appropriate CN grossly intact Motor exam: Upper Extremities Deltoid Bicep Tricep Grip  Right 5/5 5/5 5/5 5/5  Left 5/5 5/5 5/5 5/5   Lower Extremity IP Quad PF DF EHL  Right 5/5  5/5 5/5 0/5 0/5  Left 5/5 5/5 5/5 5/5 5/5   Sensation grossly intact to LT  [facility]: CT myelogram of the lumbar spine was reviewed. Primary finding is fracture of the right L5 pedicle screw just distal to the [place]. Again seen is angulation of the interbody cage at L4-5, with subsidence into the L4 vertebral body. It is unclear whether there may be some posterolateral fusion at L3-4. There is certainly no convincing evidence of interbody or posterolateral fusion at L4-5.  IMPRESSION: 61 year old woman with recurrence of right-sided back and leg pain likely related to screw fracture and continued instability at L4-5.  PLAN: We will plan on proceeding with removal of the fractured screw at L5, and replacement with standard trajectory pedicle screw   Risks of the surgery were discussed in detail in the office including risk of bleeding, infection, CSF leak, and need for additional surgeries. General risks of anesthesia were also again discussed including the risk of heart attack, stroke, and blood clots. All questions were answered, and they are willing to proceed as above.

## 2016-09-05 NOTE — OR Nursing (Signed)
Foley catheter removed end of procedure per surgeon request

## 2016-09-05 NOTE — Anesthesia Preprocedure Evaluation (Signed)
Anesthesia Evaluation  Patient identified by MRN, date of birth, ID band Patient awake    Reviewed: Allergy & Precautions, NPO status , Patient's Chart, lab work & pertinent test results  History of Anesthesia Complications (+) PONV  Airway Mallampati: II  TM Distance: >3 FB Neck ROM: Full    Dental no notable dental hx.    Pulmonary asthma , COPD, Current Smoker,    Pulmonary exam normal breath sounds clear to auscultation       Cardiovascular hypertension, Pt. on medications Normal cardiovascular exam Rhythm:Regular Rate:Normal     Neuro/Psych negative neurological ROS  negative psych ROS   GI/Hepatic Neg liver ROS, GERD  Medicated,  Endo/Other  negative endocrine ROS  Renal/GU negative Renal ROS  negative genitourinary   Musculoskeletal negative musculoskeletal ROS (+)   Abdominal   Peds negative pediatric ROS (+)  Hematology negative hematology ROS (+)   Anesthesia Other Findings   Reproductive/Obstetrics negative OB ROS                             Anesthesia Physical Anesthesia Plan  ASA: III  Anesthesia Plan: General   Post-op Pain Management:    Induction: Intravenous  PONV Risk Score and Plan: 3 and Ondansetron, Dexamethasone, Propofol and Midazolam  Airway Management Planned: Oral ETT  Additional Equipment:   Intra-op Plan:   Post-operative Plan: Extubation in OR  Informed Consent: I have reviewed the patients History and Physical, chart, labs and discussed the procedure including the risks, benefits and alternatives for the proposed anesthesia with the patient or authorized representative who has indicated his/her understanding and acceptance.   Dental advisory given  Plan Discussed with: CRNA  Anesthesia Plan Comments:         Anesthesia Quick Evaluation

## 2016-09-05 NOTE — Anesthesia Procedure Notes (Signed)
Procedure Name: Intubation Date/Time: 09/05/2016 11:06 AM Performed by: Eligha Bridegroom Pre-anesthesia Checklist: Patient identified, Emergency Drugs available, Suction available, Patient being monitored and Timeout performed Patient Re-evaluated:Patient Re-evaluated prior to inductionOxygen Delivery Method: Circle system utilized Preoxygenation: Pre-oxygenation with 100% oxygen Intubation Type: IV induction Ventilation: Mask ventilation without difficulty Laryngoscope Size: Glidescope and 3 Grade View: Grade II Tube type: Oral Tube size: 7.0 mm Number of attempts: 1 Airway Equipment and Method: Stylet and Video-laryngoscopy Placement Confirmation: ETT inserted through vocal cords under direct vision,  positive ETCO2 and breath sounds checked- equal and bilateral Secured at: 20 cm Tube secured with: Tape Dental Injury: Teeth and Oropharynx as per pre-operative assessment

## 2016-09-06 LAB — CBC
HEMATOCRIT: 38 % (ref 36.0–46.0)
Hemoglobin: 11.4 g/dL — ABNORMAL LOW (ref 12.0–15.0)
MCH: 28.3 pg (ref 26.0–34.0)
MCHC: 30 g/dL (ref 30.0–36.0)
MCV: 94.3 fL (ref 78.0–100.0)
PLATELETS: 220 10*3/uL (ref 150–400)
RBC: 4.03 MIL/uL (ref 3.87–5.11)
RDW: 23.2 % — AB (ref 11.5–15.5)
WBC: 7.7 10*3/uL (ref 4.0–10.5)

## 2016-09-06 LAB — BASIC METABOLIC PANEL
Anion gap: 7 (ref 5–15)
BUN: 6 mg/dL (ref 6–20)
CALCIUM: 8.8 mg/dL — AB (ref 8.9–10.3)
CO2: 25 mmol/L (ref 22–32)
CREATININE: 0.53 mg/dL (ref 0.44–1.00)
Chloride: 105 mmol/L (ref 101–111)
Glucose, Bld: 118 mg/dL — ABNORMAL HIGH (ref 65–99)
Potassium: 3.8 mmol/L (ref 3.5–5.1)
SODIUM: 137 mmol/L (ref 135–145)

## 2016-09-06 MED ORDER — DIAZEPAM 5 MG PO TABS: 5.0000 mg | ORAL_TABLET | Freq: Four times a day (QID) | ORAL | 0 refills | Status: DC | PRN

## 2016-09-06 MED ORDER — TIZANIDINE HCL 6 MG PO CAPS: 6.0000 mg | ORAL_CAPSULE | Freq: Every day | ORAL | 1 refills | Status: DC

## 2016-09-06 MED ORDER — OXYCODONE HCL 5 MG PO TABS: 5.0000 mg | ORAL_TABLET | ORAL | 0 refills | Status: DC | PRN

## 2016-09-06 NOTE — Evaluation (Signed)
Occupational Therapy Evaluation Patient Details Name: Jasmine [name] MRN: [id number] DOB: May 09, 1955 Today's Date: 09/06/2016    History of Present Illness pt admitted 7/3 and underwent Removal of previous L5 screws/rods and Exploration of fusion, L4-5 and Placement of L5 pedicle screws and Posterolateral fusion, L4-5. PMH: COPD, anxiety PSH: previous back surgeries, cervical fusion.   Clinical Impression   Pt reports she was independent with ADL PTA. Currently pt overall supervision for ADL and functional mobility. Pt requires max cues during functional activities to maintain back precautions. Began back, safety, and ADL education with pt. Pt planning to d/c home with 24/7 supervision from family. Pt would benefit from continued skilled OT to address established goals.    Follow Up Recommendations  No OT follow up;Supervision/Assistance - 24 hour    Equipment Recommendations  None recommended by OT    Recommendations for Other Services       Precautions / Restrictions Precautions Precautions: Back Precaution Booklet Issued: No Precaution Comments: reviewed back precautions with pt Required Braces or Orthoses: Spinal Brace;Other Brace/Splint Spinal Brace: Lumbar corset;Applied in sitting position Other Brace/Splint: pt with R AFO for drop foot  Restrictions Weight Bearing Restrictions: No      Mobility Bed Mobility       General bed mobility comments: Pt sitting EOB with PT upon arrival.  Transfers Overall transfer level: Needs assistance Equipment used: Rolling walker (2 wheeled) Transfers: Sit to/from Stand Sit to Stand: Supervision         General transfer comment: for safety, cues to maintain back precautions    Balance Overall balance assessment: Needs assistance Sitting-balance support: Feet supported;No upper extremity supported Sitting balance-Leahy Scale: Good     Standing balance support: Single extremity supported;During functional  activity Standing balance-Leahy Scale: Fair                             ADL either performed or assessed with clinical judgement   ADL Overall ADL's : Needs assistance/impaired Eating/Feeding: Set up;Sitting   Grooming: Supervision/safety;Standing Grooming Details (indicate cue type and reason): Educated on use of 2 cups for oral care Upper Body Bathing: Set up;Supervision/ safety;Sitting;Cueing for compensatory techniques   Lower Body Bathing: Set up;Supervison/ safety;Sit to/from stand;Cueing for back precautions;Cueing for compensatory techniques   Upper Body Dressing : Set up;Sitting;Cueing for compensatory techniques Upper Body Dressing Details (indicate cue type and reason): Pt reports no questions or concerns regarding brace management. Lower Body Dressing: Set up;Supervision/safety;Sit to/from stand;Cueing for back precautions;Cueing for compensatory techniques Lower Body Dressing Details (indicate cue type and reason): Pt able to cross foot over opposite knee. Educated on compensatory strategies for LB ADL. Pt able to don/doff R AFO without physical assist but requires max verbal cues for maintaining back precautions. Toilet Transfer: Supervision/safety;Ambulation;BSC;RW Toilet Transfer Details (indicate cue type and reason): Simulated by sit to stand from EOB with functional mobility.   Toileting - Clothing Manipulation Details (indicate cue type and reason): Educated on proper technique for peri care without twisting and use of wet wipes. Tub/ Shower Transfer: Supervision/safety;Walk-in shower;Ambulation;Rolling walker Tub/Shower Transfer Details (indicate cue type and reason): Educated pt on energy conservation strategies for safety with bathing and shower transfer Functional mobility during ADLs: Supervision/safety;Rolling walker General ADL Comments: Educated pt on maintaining precautions during functional activities, keeping frequently used items at counter top  height, frequent mobility throughout the day upon return home. Pt with difficulty maintining back precautions during functional activiteis; discussed importance of  maintaining back precautions initially for healing.     Vision         Perception     Praxis      Pertinent Vitals/Pain Pain Assessment: Faces Faces Pain Scale: Hurts whole lot Pain Location: back Pain Descriptors / Indicators: Aching;Sore Pain Intervention(s): Limited activity within patient's tolerance;Monitored during session;Repositioned     Hand Dominance Right   Extremity/Trunk Assessment Upper Extremity Assessment Upper Extremity Assessment: Overall WFL for tasks assessed   Lower Extremity Assessment Lower Extremity Assessment: Defer to PT evaluation    Cervical / Trunk Assessment Cervical / Trunk Assessment: Other exceptions Cervical / Trunk Exceptions: s/p spinal sx   Communication Communication Communication: No difficulties   Cognition Arousal/Alertness: Awake/alert Behavior During Therapy: WFL for tasks assessed/performed Overall Cognitive Status: Within Functional Limits for tasks assessed                                     General Comments       Exercises     Shoulder Instructions      Home Living Family/patient expects to be discharged to:: Private residence Living Arrangements: Spouse/significant other;Other relatives Available Help at Discharge: Family;Available 24 hours/day Type of Home: House Home Access: Stairs to enter [employer] of Steps: 3 Entrance Stairs-Rails: Left Home Layout: One level     Bathroom Shower/Tub: Occupational psychologist: Standard     Home Equipment: Environmental consultant - 4 wheels;Bedside commode;Shower seat;Adaptive equipment Adaptive Equipment: Reacher        Prior Functioning/Environment Level of Independence: Independent with assistive device(s)        Comments: Pt reports minimal ambulation due to pain. pt reports  spouse pushes in w/c for doctor appointments and community transfer. pt sleeps in recliner at home        OT Problem List: Decreased strength;Decreased range of motion;Decreased activity tolerance;Impaired balance (sitting and/or standing);Decreased knowledge of use of DME or AE;Decreased knowledge of precautions;Cardiopulmonary status limiting activity;Pain      OT Treatment/Interventions: Self-care/ADL training;Therapeutic exercise;Energy conservation;DME and/or AE instruction;Therapeutic activities;Patient/family education;Balance training    OT Goals(Current goals can be found in the care plan section) Acute Rehab OT Goals Patient Stated Goal: go home OT Goal Formulation: With patient Time For Goal Achievement: 09/20/16 Potential to Achieve Goals: Good ADL Goals Pt Will Perform Grooming: with modified independence;standing Pt Will Perform Lower Body Bathing: with modified independence;sit to/from stand Pt Will Perform Lower Body Dressing: with modified independence;sit to/from stand Pt Will Transfer to Toilet: with modified independence;ambulating;bedside commode (over toilet) Pt Will Perform Toileting - Clothing Manipulation and hygiene: with modified independence;sit to/from stand Additional ADL Goal #1: Pt will independently verbally recall 3/3 back precautions and maintain during ADL. Additional ADL Goal #2: Pt will perform bed mobility independently as precursor to ADL.   OT Frequency: Min 2X/week   Barriers to D/C:            Co-evaluation              AM-PAC PT "6 Clicks" Daily Activity     Outcome Measure Help from another person eating meals?: None Help from another person taking care of personal grooming?: A Little Help from another person toileting, which includes using toliet, bedpan, or urinal?: A Little Help from another person bathing (including washing, rinsing, drying)?: A Little Help from another person to put on and taking off regular upper body  clothing?: A  Little Help from another person to put on and taking off regular lower body clothing?: A Little 6 Click Score: 19   End of Session Equipment Utilized During Treatment: Rolling walker;Back brace Nurse Communication: Mobility status;Other (comment) (no equipment or f/u needs)  Activity Tolerance: Patient tolerated treatment well Patient left: in chair;with call bell/phone within reach  OT Visit Diagnosis: Unsteadiness on feet (R26.81);Other abnormalities of gait and mobility (R26.89);Pain Pain - part of body:  (back)                Time: 1448-1856 OT Time Calculation (min): 20 min Charges:  OT General Charges $OT Visit: 1 Procedure OT Evaluation $OT Eval Moderate Complexity: 1 Procedure G-Codes:     Raye Wiens A. Ulice Brilliant, M.S., OTR/L Pager: [place] 09/06/2016, 9:02 AM

## 2016-09-06 NOTE — Evaluation (Signed)
Physical Therapy Evaluation Patient Details Name: Jasmine [name] MRN: [id number] DOB: 1955-05-04 Today's Date: 09/06/2016   History of Present Illness  pt admitted 7/3 and underwent Removal of previous L5 screws/rods and Exploration of fusion, L4-5 and Placement of L5 pedicle screws and Posterolateral fusion, L4-5. PMH: COPD, anxiety PSH: previous back surgeries, cervical fusion.  Clinical Impression  Patient is s/p above surgery resulting in the deficits listed below (see PT Problem List). Pt admitted to not adhering to precautions, emphasized the importance of adherence. Patient will benefit from skilled PT to increase their independence and safety with mobility (while adhering to their precautions) to allow discharge to the venue listed below.     Follow Up Recommendations No PT follow up;Supervision - Intermittent    Equipment Recommendations  None recommended by PT    Recommendations for Other Services       Precautions / Restrictions Precautions Precautions: Back Precaution Booklet Issued: Yes (comment) Precaution Comments: pt with verbal understanding however pt requires max v/c's to adhere to them functionally Required Braces or Orthoses: Spinal Brace;Other Brace/Splint Spinal Brace: Lumbar corset;Applied in sitting position Other Brace/Splint: pt with R AFO for drop foot  Restrictions Weight Bearing Restrictions: No      Mobility  Bed Mobility Overal bed mobility: Modified Independent Bed Mobility: Rolling;Sidelying to Sit;Sit to Sidelying Rolling: Modified independent (Device/Increase time) Sidelying to sit: Modified independent (Device/Increase time)     Sit to sidelying: Modified independent (Device/Increase time) General bed mobility comments: v/c's for log roll technique but no physical assist required  Transfers Overall transfer level: Needs assistance Equipment used: Rolling walker (2 wheeled) Transfers: Sit to/from Stand Sit to Stand: Supervision          General transfer comment: max v/c's to adhere to no twisting and bending but no physical assist required  Ambulation/Gait Ambulation/Gait assistance: Modified independent (Device/Increase time) Ambulation Distance (Feet): 120 Feet Assistive device: Rolling walker (2 wheeled) Gait Pattern/deviations: Step-through pattern;Decreased stride length Gait velocity: slow Gait velocity interpretation: Below normal speed for age/gender General Gait Details: v/c's to maintain upright position and stay in walker to minimize trunk flexion  Stairs Stairs: Yes Stairs assistance: Min guard Stair Management: One rail Left;Step to pattern Number of Stairs: 3 (x2) General stair comments: v/c's to complete sideways without twisting   Wheelchair Mobility    Modified Rankin (Stroke Patients Only)       Balance Overall balance assessment:  (needs RW for safe amb)                                           Pertinent Vitals/Pain Pain Assessment: 0-10 Pain Score: 7  Pain Location: surgical site Pain Descriptors / Indicators: Sore Pain Intervention(s): RN gave pain meds during session    Home Living Family/patient expects to be discharged to:: Private residence Living Arrangements: Spouse/significant other;Other relatives Available Help at Discharge: Family;Available 24 hours/day Type of Home: House Home Access: Stairs to enter Entrance Stairs-Rails: Left Entrance Stairs-Number of Steps: 3 Home Layout: One level Home Equipment: Walker - 4 wheels;Bedside commode;Shower seat;Adaptive equipment      Prior Function Level of Independence: Independent with assistive device(s)         Comments: Pt reports minimal ambulation due to pain. pt reports spouse pushes in w/c for doctor appointments and community transfer. pt sleeps in recliner at home     [address]  Dominant Hand: Right    Extremity/Trunk Assessment   Upper Extremity Assessment Upper Extremity  Assessment: Overall WFL for tasks assessed    Lower Extremity Assessment Lower Extremity Assessment: RLE deficits/detail RLE Deficits / Details: drop foot, has had from original back injury    Cervical / Trunk Assessment Cervical / Trunk Assessment: Other exceptions Cervical / Trunk Exceptions: recent back surgery  Communication   Communication: No difficulties  Cognition Arousal/Alertness: Awake/alert Behavior During Therapy: Anxious Overall Cognitive Status: Within Functional Limits for tasks assessed                                        General Comments      Exercises     Assessment/Plan    PT Assessment Patient needs continued PT services  PT Problem List Decreased strength;Decreased activity tolerance;Decreased balance;Decreased mobility;Decreased safety awareness;Decreased knowledge of precautions;Pain       PT Treatment Interventions DME instruction;Gait training;Stair training;Functional mobility training;Therapeutic activities;Therapeutic exercise;Balance training    PT Goals (Current goals can be found in the Care Plan section)  Acute Rehab PT Goals Patient Stated Goal: home PT Goal Formulation: With patient Time For Goal Achievement: 09/13/16 Potential to Achieve Goals: Good    Frequency Min 5X/week   Barriers to discharge        Co-evaluation               AM-PAC PT "6 Clicks" Daily Activity  Outcome Measure Difficulty turning over in bed (including adjusting bedclothes, sheets and blankets)?: None Difficulty moving from lying on back to sitting on the side of the bed? : None Difficulty sitting down on and standing up from a chair with arms (e.g., wheelchair, bedside commode, etc,.)?: None Help needed moving to and from a bed to chair (including a wheelchair)?: None Help needed walking in hospital room?: A Little Help needed climbing 3-5 steps with a railing? : A Little 6 Click Score: 22    End of Session Equipment  Utilized During Treatment: Gait belt;Back brace Activity Tolerance: Patient tolerated treatment well Patient left:  (sitting EOB with OT) Nurse Communication: Mobility status;Precautions PT Visit Diagnosis: Muscle weakness (generalized) (M62.81);Pain Pain - part of body:  (back)    Time: 1610-9604 PT Time Calculation (min) (ACUTE ONLY): 29 min   Charges:   PT Evaluation $PT Eval Moderate Complexity: 1 Procedure PT Treatments $Gait Training: 8-22 mins   PT G Codes:        Kittie Plater, PT, DPT Pager #: [phone] Office #: [phone]   [place] 09/06/2016, 8:48 AM

## 2016-09-06 NOTE — Discharge Summary (Signed)
Physician Discharge Summary  Patient ID: Jasmine [name] MRN: [id number] DOB/AGE: [birth year] 61 y.o.  Admit date: 09/05/2016 Discharge date: 09/06/2016  Admission Diagnoses:  Discharge Diagnoses:  Active Problems:   Pseudoarthrosis of lumbar spine   Discharged Condition: poor  Hospital Course: Patient mid-hospital where she underwent uncomplicated lumbar revision of fusion instrumentation. Postoperative she is doing fairly well. Still with moderately severe back pain. No new radicular symptoms. No new numbness paresthesias and weakness. Patient ambulating and ready to go home.  Consults:   Significant Diagnostic Studies:   Treatments:   Discharge Exam: Blood pressure (!) 146/65, pulse 67, temperature 98.3 F (36.8 C), temperature source Oral, resp. rate 18, weight 49 kg (108 lb 0.4 oz), SpO2 97 %. Awake and alert. Oriented and appropriate. Motor and sensory function intact. Wound clean and dry. Chest abdomen benign.  Disposition: 01-Home or Self Care   Allergies as of 09/06/2016      Reactions   Lyrica [pregabalin] Other (See Comments)   EXTREME SHAKING/TREMBLING   Darvon [propoxyphene]    UNSPECIFIED REACTION    Gabapentin    Extreme shaking and trembling    Augmentin [amoxicillin-pot Clavulanate] Nausea And Vomiting   Has patient had a PCN reaction causing immediate rash, facial/tongue/throat swelling, SOB or lightheadedness with hypotension:no Has patient had a PCN reaction causing severe rash involving mucus membranes or skin necrosis:No Has patient had a PCN reaction that required hospitalization:No Has patient had a PCN reaction occurring within the last 10 years:Yes--ONLY N/V If all of the above answers are "NO", then may proceed with Cephalosporin use.   Erythromycin Itching, Rash      Vibramycin [doxycycline Calcium] Itching, Rash   "SPLOTCHING "      Medication List    TAKE these medications   albuterol 108 (90 Base) MCG/ACT inhaler Commonly known as:   PROVENTIL HFA;VENTOLIN HFA Inhale 2 puffs into the lungs every 6 (six) hours as needed for wheezing or shortness of breath.   amLODipine 10 MG tablet Commonly known as:  NORVASC Take 1 tablet (10 mg total) by mouth daily.   BC HEADACHE POWDER PO Take 1 packet by mouth daily as needed (headaches).   budesonide-formoterol 160-4.5 MCG/ACT inhaler Commonly known as:  SYMBICORT Inhale 2 puffs into the lungs 2 (two) times daily.   calcium carbonate 1500 (600 Ca) MG Tabs tablet Commonly known as:  OSCAL Take 600 mg by mouth daily.   cetirizine 10 MG tablet Commonly known as:  ZYRTEC Take 10 mg by mouth daily as needed for allergies. Reported on 03/12/2015   diclofenac sodium 1 % Gel Commonly known as:  VOLTAREN Apply 1 application topically daily as needed for muscle pain.   DULoxetine 30 MG capsule Commonly known as:  CYMBALTA Take 3 capsules (90 mg total) by mouth daily. What changed:  how much to take  when to take this   guaiFENesin 600 MG 12 hr tablet Commonly known as:  MUCINEX Take 600 mg by mouth 2 (two) times daily as needed (congestion).   HYSINGLA ER 40 MG T24a Generic drug:  HYDROcodone Bitartrate ER Take 40 mg by mouth daily.   ipratropium-albuterol 0.5-2.5 (3) MG/3ML Soln Commonly known as:  DUONEB Inhale 3 mLs into the lungs 4 (four) times daily as needed (for shortness of breath/wheezing). *May use as needed   l-methylfolate-B6-B12 3-35-2 MG Tabs tablet Commonly known as:  METANX Take 1 tablet by mouth 2 (two) times daily.   oxyCODONE 5 MG immediate release tablet Commonly known as:  Oxy IR/ROXICODONE Take 1-2 tablets (5-10 mg total) by mouth every 3 (three) hours as needed for breakthrough pain.   pantoprazole 40 MG tablet Commonly known as:  PROTONIX TAKE 1 TABLET DAILY   predniSONE 20 MG tablet Commonly known as:  DELTASONE 2 po at same time daily for 5 days   tiotropium 18 MCG inhalation capsule Commonly known as:  SPIRIVA HANDIHALER Place 1  capsule (18 mcg total) into inhaler and inhale daily.   tizanidine 6 MG capsule Commonly known as:  ZANAFLEX Take 1 capsule (6 mg total) by mouth daily.   traMADol 50 MG tablet Commonly known as:  ULTRAM Take 50 mg by mouth 2 (two) times daily as needed for moderate pain.   traZODone 150 MG tablet Commonly known as:  DESYREL Take 1 tablet (150 mg total) by mouth at bedtime.        Signed: Amalio Loe A 09/06/2016, 10:20 AM

## 2016-09-06 NOTE — Progress Notes (Addendum)
Patient discharged from room 3C07 at this time. Alert and in stable condition. IV site d/c'd and instructions read to patient and family with understanding verbalized. Left unit via wheelchair with husband and family at side.

## 2016-09-07 NOTE — Anesthesia Postprocedure Evaluation (Addendum)
Anesthesia Post Note  Patient: AUGUSTA MIRKIN  Procedure(s) Performed: Procedure(s) (LRB): HARDWARE REMOVAL AND REPLACEMENT OF LUMBAR FIVE SCREWS (N/A) APPLICATION OF ROBOTIC ASSISTANCE FOR SPINAL PROCEDURE (N/A)     Patient location during evaluation: PACU Anesthesia Type: General Level of consciousness: awake and alert Pain management: pain level controlled Vital Signs Assessment: post-procedure vital signs reviewed and stable Respiratory status: spontaneous breathing, nonlabored ventilation, respiratory function stable and patient connected to nasal cannula oxygen Cardiovascular status: blood pressure returned to baseline and stable Postop Assessment: no signs of nausea or vomiting Anesthetic complications: no    Last Vitals:  Vitals:   09/06/16 0400 09/06/16 0744  BP: 132/71 (!) 146/65  Pulse: 76 67  Resp: 18 18  Temp: 36.8 C 36.8 C    Last Pain:  Vitals:   09/06/16 0852  TempSrc:   PainSc: 5    Pain Goal: Patients Stated Pain Goal: Other (Comment) (unsure as of now ) (09/05/16 2030)               Montez Hageman

## 2016-09-08 ENCOUNTER — Encounter (HOSPITAL_COMMUNITY): Payer: Self-pay | Admitting: Neurosurgery

## 2016-10-03 ENCOUNTER — Encounter (HOSPITAL_COMMUNITY): Payer: Self-pay | Admitting: Oncology

## 2016-10-03 ENCOUNTER — Encounter (HOSPITAL_COMMUNITY): Attending: Oncology | Admitting: Oncology

## 2016-10-03 ENCOUNTER — Encounter (HOSPITAL_COMMUNITY)

## 2016-10-03 DIAGNOSIS — F17210 Nicotine dependence, cigarettes, uncomplicated: Secondary | ICD-10-CM | POA: Diagnosis not present

## 2016-10-03 DIAGNOSIS — F419 Anxiety disorder, unspecified: Secondary | ICD-10-CM | POA: Diagnosis not present

## 2016-10-03 DIAGNOSIS — D508 Other iron deficiency anemias: Secondary | ICD-10-CM | POA: Diagnosis present

## 2016-10-03 DIAGNOSIS — K219 Gastro-esophageal reflux disease without esophagitis: Secondary | ICD-10-CM | POA: Insufficient documentation

## 2016-10-03 DIAGNOSIS — D509 Iron deficiency anemia, unspecified: Secondary | ICD-10-CM

## 2016-10-03 DIAGNOSIS — M545 Low back pain: Secondary | ICD-10-CM | POA: Insufficient documentation

## 2016-10-03 DIAGNOSIS — J449 Chronic obstructive pulmonary disease, unspecified: Secondary | ICD-10-CM | POA: Diagnosis not present

## 2016-10-03 DIAGNOSIS — I10 Essential (primary) hypertension: Secondary | ICD-10-CM | POA: Insufficient documentation

## 2016-10-03 DIAGNOSIS — G5603 Carpal tunnel syndrome, bilateral upper limbs: Secondary | ICD-10-CM | POA: Diagnosis not present

## 2016-10-03 LAB — CBC WITH DIFFERENTIAL/PLATELET
Basophils Absolute: 0.1 10*3/uL (ref 0.0–0.1)
Basophils Relative: 1 %
Eosinophils Absolute: 0.2 10*3/uL (ref 0.0–0.7)
Eosinophils Relative: 3 %
HEMATOCRIT: 42.3 % (ref 36.0–46.0)
HEMOGLOBIN: 13.5 g/dL (ref 12.0–15.0)
LYMPHS ABS: 1.5 10*3/uL (ref 0.7–4.0)
LYMPHS PCT: 20 %
MCH: 30.5 pg (ref 26.0–34.0)
MCHC: 31.9 g/dL (ref 30.0–36.0)
MCV: 95.7 fL (ref 78.0–100.0)
Monocytes Absolute: 0.9 10*3/uL (ref 0.1–1.0)
Monocytes Relative: 11 %
NEUTROS PCT: 65 %
Neutro Abs: 5.2 10*3/uL (ref 1.7–7.7)
Platelets: 220 10*3/uL (ref 150–400)
RBC: 4.42 MIL/uL (ref 3.87–5.11)
RDW: 18.7 % — ABNORMAL HIGH (ref 11.5–15.5)
WBC: 7.9 10*3/uL (ref 4.0–10.5)

## 2016-10-03 LAB — COMPREHENSIVE METABOLIC PANEL
ALK PHOS: 83 U/L (ref 38–126)
ALT: 21 U/L (ref 14–54)
AST: 28 U/L (ref 15–41)
Albumin: 4 g/dL (ref 3.5–5.0)
Anion gap: 9 (ref 5–15)
BUN: 9 mg/dL (ref 6–20)
CALCIUM: 9.3 mg/dL (ref 8.9–10.3)
CO2: 24 mmol/L (ref 22–32)
CREATININE: 0.56 mg/dL (ref 0.44–1.00)
Chloride: 107 mmol/L (ref 101–111)
Glucose, Bld: 104 mg/dL — ABNORMAL HIGH (ref 65–99)
Potassium: 3.4 mmol/L — ABNORMAL LOW (ref 3.5–5.1)
Sodium: 140 mmol/L (ref 135–145)
Total Bilirubin: 0.6 mg/dL (ref 0.3–1.2)
Total Protein: 7.5 g/dL (ref 6.5–8.1)

## 2016-10-03 LAB — IRON AND TIBC
IRON: 54 ug/dL (ref 28–170)
SATURATION RATIOS: 13 % (ref 10.4–31.8)
TIBC: 416 ug/dL (ref 250–450)
UIBC: 362 ug/dL

## 2016-10-03 LAB — FERRITIN: FERRITIN: 23 ng/mL (ref 11–307)

## 2016-10-03 NOTE — Patient Instructions (Addendum)
[place] at [facility] Discharge Instructions  RECOMMENDATIONS MADE BY THE CONSULTANT AND ANY TEST RESULTS WILL BE SENT TO YOUR REFERRING PHYSICIAN.  You were seen today by Kirby Crigler PA-C. Return in 4 months for labs and follow up.    Thank you for choosing [place] at [facility] to provide your oncology and hematology care.  To afford each patient quality time with our provider, please arrive at least 15 minutes before your scheduled appointment time.    If you have a lab appointment with the [facility] please come in thru the  Main Entrance and check in at the main information desk  You need to re-schedule your appointment should you arrive 10 or more minutes late.  We strive to give you quality time with our providers, and arriving late affects you and other patients whose appointments are after yours.  Also, if you no show three or more times for appointments you may be dismissed from the clinic at the providers discretion.     Again, thank you for choosing [facility].  Our hope is that these requests will decrease the amount of time that you wait before being seen by our physicians.       _____________________________________________________________  Should you have questions after your visit to [facility], please contact our office at [phone] between the hours of 8:30 a.m. and 4:30 p.m.  Voicemails left after 4:30 p.m. will not be returned until the following business day.  For prescription refill requests, have your pharmacy contact our office.       Resources For Cancer Patients and their Caregivers ? American Cancer Society: Can assist with transportation, wigs, general needs, runs Look Good Feel Better.        ([phone] 041-7717 ? Cancer Care: Provides financial assistance, online support groups, medication/co-pay assistance.  1-800-813-HOPE 956 756 7181) ? [place] Assists [place] cancer patients and their families through emotional , educational and financial support.  787-689-9896 ? [place] DSS Where to apply for food stamps, Medicaid and utility assistance. (917)753-3927 ? [facility]: Transportation to medical appointments. (561)039-8201 ? Social Security Administration: May apply for disability if have a Stage IV cancer. 201 569 1982 760-010-2574 ? [employer], Disability and Transit Services: Assists with nutrition, care and transit needs. [place] Support Programs: @10RELATIVEDAYS @ > Cancer Support Group  2nd Tuesday of the month 1pm-2pm, Journey Room  > Creative Journey  3rd Tuesday of the month 1130am-1pm, Journey Room  > Look Good Feel Better  1st Wednesday of the month 10am-12 noon, Journey Room (Call [facility] to register [phone])

## 2016-10-03 NOTE — Assessment & Plan Note (Addendum)
Iron deficiency anemia having required IV iron replacement in the past with history of reactive thrombocytosis.  Labs today: CBC diff, CMET, iron/TIBC, ferritin.  I personally reviewed and went over laboratory results with the patient.  The results are noted within this dictation.  Hemoglobin is within normal limits. Iron studies are pending at this time.  Labs in 4 months: CBC diff, iron/TIBC, ferritin.  She is S/P removal of previous L5 screws/rod, exploration of L4-5 fusion, placement of L5 pedicle screws, and posterolateral fusion at L4-5 on 09/05/2016 by Dr. Kathyrn Sheriff.   Return in 4 months for follow-up.  If patient continues to require IV iron replacement therapy, GI workup would be indicated.

## 2016-10-03 NOTE — Progress Notes (Signed)
Timmothy Euler, MD [place] [id number]  Other iron deficiency anemia - Plan: methocarbamol (ROBAXIN) 500 MG tablet, CBC with Differential, Iron and TIBC, Ferritin  CURRENT THERAPY: IV iron infusion when needed  INTERVAL HISTORY: Jasmine [name] 61 y.o. female returns for followup of iron deficiency anemia having required IV iron replacement in the past.    HPI Elements   Location: Blood  Quality: Iron deficiency anemia  Severity: Moderate  Duration: Dx in Jan 2018  Context: Required IV iron on 07/20/2016, 510 mg Feraheme  Timing:   Modifying Factors:   Associated Signs & Symptoms: Reactive thrombocytosis    She reports in appetite of 50%.  Weight is stable. She rates her energy level at 75%.  She reports lower back pain and she rates it as a 7 out of 10.  Review of Systems  Constitutional: Positive for malaise/fatigue. Negative for chills, fever and weight loss.  HENT: Negative.   Eyes: Negative.   Respiratory: Negative.  Negative for cough.   Cardiovascular: Positive for leg swelling. Negative for chest pain.  Gastrointestinal: Positive for nausea. Negative for blood in stool, constipation, diarrhea, melena and vomiting.  Genitourinary: Negative.   Musculoskeletal: Negative.   Skin: Negative.   Neurological: Positive for sensory change. Negative for weakness.  Endo/Heme/Allergies: Negative.   Psychiatric/Behavioral: Negative.     Past Medical History:  Diagnosis Date  . Allergy   . Anemia   . Anxiety   . Arthritis   . Asthma   . Carpal tunnel syndrome    bilateral  . Complication of anesthesia    in the past has had N/V, the last few surgeries she has not been  . COPD (chronic obstructive pulmonary disease) ([place])   . Easy bruising 07/10/2016  . GERD (gastroesophageal reflux disease)   . Headache   . Heart murmur 2005   never had any problems  . History of blood transfusion 2018  . History of kidney stones   . Hypertension   .  Hypoglycemia   . Hypoglycemia   . Iron deficiency anemia 07/11/2016  . Neuromuscular disorder ([place])    pt denies  . Neuropathy    both arms  . Normocytic anemia 07/29/2015  . Pneumonia   . PONV (postoperative nausea and vomiting)   . Sciatica   . Seizures ([place])    as a child when she would have an asthma attack. none as an adult  . Shortness of breath dyspnea     Past Surgical History:  Procedure Laterality Date  . ABDOMINAL HYSTERECTOMY  2005  . APPLICATION OF ROBOTIC ASSISTANCE FOR SPINAL PROCEDURE N/A 09/05/2016   Procedure: APPLICATION OF ROBOTIC ASSISTANCE FOR SPINAL PROCEDURE;  Surgeon: Consuella Lose, MD;  Location: [facility];  Service: Neurosurgery;  Laterality: N/A;  . CERVICAL FUSION    . [place]  . COLONOSCOPY    . cyst removal face    . disk repair     neck and lumbar region  . FOOT SURGERY Bilateral    to file bones down   . HARDWARE REMOVAL N/A 09/05/2016   Procedure: HARDWARE REMOVAL AND REPLACEMENT OF LUMBAR FIVE SCREWS;  Surgeon: Consuella Lose, MD;  Location: [facility];  Service: Neurosurgery;  Laterality: N/A;  . IMPLANTATION / PLACEMENT EPIDURAL NEUROSTIMULATOR ELECTRODES    . LAMINECTOMY     2006  . LUMBAR FUSION    . SPINAL CORD STIMULATOR BATTERY EXCHANGE    .  SPINAL CORD STIMULATOR INSERTION N/A 07/05/2016   Procedure: REPLACEMENT OF LUMBAR SPINAL CORD STIMULATOR BATTERY;  Surgeon: Newman Pies, MD;  Location: [facility];  Service: Neurosurgery;  Laterality: N/A;  . TONSILLECTOMY      Family History  Problem Relation Age of Onset  . Heart disease Mother   . Diabetes Mother   . Hypertension Mother   . Hyperlipidemia Mother   . COPD Mother   . COPD Father   . Diabetes Father   . Heart disease Father   . Cancer Father        throat  . Dementia Father   . Asthma Son   . Hypertension Sister   . Hyperlipidemia Sister   . Hypertension Brother   . Hyperlipidemia Brother   . Hypertension Brother   . Hyperlipidemia Brother     Social  History   Social History  . Marital status: Married    Spouse name: N/A  . Number of children: N/A  . Years of education: N/A   Social History Main Topics  . Smoking status: Current Every Day Smoker    Packs/day: 0.50    Years: 35.00    Types: Cigarettes  . Smokeless tobacco: Never Used     Comment: trying to quit  . Alcohol use 1.2 oz/week    2 Glasses of wine per week     Comment: occasionally  . Drug use: No  . Sexual activity: Not Asked   Other Topics Concern  . None   Social History Narrative  . None     PHYSICAL EXAMINATION  ECOG PERFORMANCE STATUS: 2 - Symptomatic, <50% confined to bed  Vitals:   10/03/16 1037  BP: (!) 143/70  Pulse: 86  Resp: 18    GENERAL:alert, no distress, well nourished, well developed, comfortable, cooperative, smiling and unaccompanied, rolling walker nearby SKIN: skin color, texture, turgor are normal, no rashes or significant lesions HEAD: Normocephalic, No masses, lesions, tenderness or abnormalities EYES: normal, EOMI, Conjunctiva are pink and non-injected EARS: External ears normal OROPHARYNX:lips, buccal mucosa, and tongue normal and mucous membranes are moist  NECK: supple, trachea midline LYMPH:  not examined BREAST:not examined LUNGS: clear to auscultation , decreased breath sounds HEART: regular rate & rhythm ABDOMEN:abdomen soft and normal bowel sounds BACK: Back symmetric, no curvature., back brace in place due to recent surgery EXTREMITIES:less then 2 second capillary refill, no joint deformities, effusion, or inflammation, no skin discoloration, no cyanosis  NEURO: alert & oriented x 3 with fluent speech, no focal motor/sensory deficits   LABORATORY DATA: CBC    Component Value Date/Time   WBC 7.9 10/03/2016 1009   RBC 4.42 10/03/2016 1009   HGB 13.5 10/03/2016 1009   HGB 10.7 (L) 04/06/2016 1442   HCT 42.3 10/03/2016 1009   HCT 34.8 04/06/2016 1442   PLT 220 10/03/2016 1009   PLT 374 04/06/2016 1442    MCV 95.7 10/03/2016 1009   MCV 85 04/06/2016 1442   MCH 30.5 10/03/2016 1009   MCHC 31.9 10/03/2016 1009   RDW 18.7 (H) 10/03/2016 1009   RDW 16.3 (H) 04/06/2016 1442   LYMPHSABS 1.5 10/03/2016 1009   LYMPHSABS 2.2 04/06/2016 1442   MONOABS 0.9 10/03/2016 1009   EOSABS 0.2 10/03/2016 1009   EOSABS 0.0 04/06/2016 1442   BASOSABS 0.1 10/03/2016 1009   BASOSABS 0.1 04/06/2016 1442      Chemistry      Component Value Date/Time   NA 140 10/03/2016 1009   NA 142 03/16/2016 1648  K 3.4 (L) 10/03/2016 1009   CL 107 10/03/2016 1009   CO2 24 10/03/2016 1009   BUN 9 10/03/2016 1009   BUN 6 (L) 03/16/2016 1648   CREATININE 0.56 10/03/2016 1009      Component Value Date/Time   CALCIUM 9.3 10/03/2016 1009   ALKPHOS 83 10/03/2016 1009   AST 28 10/03/2016 1009   ALT 21 10/03/2016 1009   BILITOT 0.6 10/03/2016 1009   BILITOT <0.2 01/03/2016 1425      Lab Results  Component Value Date   IRON 57 08/02/2016   TIBC 395 08/02/2016   FERRITIN 127 08/02/2016     PENDING LABS:   RADIOGRAPHIC STUDIES:  Dg Lumbar Spine 2-3 Views  Result Date: 09/05/2016 CLINICAL DATA:  Revision L5-S1 hardware EXAM: DG C-ARM 61-120 MIN; LUMBAR SPINE - 2-3 VIEW COMPARISON:  CT lumbar spine dated 08/14/2016 FINDINGS: Frontal and lateral intraoperative radiographs demonstrate L4-S1 PLIF with interbody spacers at L5-S1. IMPRESSION: Intraoperative fluoroscopic radiographs, as above. Electronically Signed   By: Julian Hy M.D.   On: 09/05/2016 13:50   Dg C-arm 1-60 Min  Result Date: 09/05/2016 CLINICAL DATA:  Revision L5-S1 hardware EXAM: DG C-ARM 61-120 MIN; LUMBAR SPINE - 2-3 VIEW COMPARISON:  CT lumbar spine dated 08/14/2016 FINDINGS: Frontal and lateral intraoperative radiographs demonstrate L4-S1 PLIF with interbody spacers at L5-S1. IMPRESSION: Intraoperative fluoroscopic radiographs, as above. Electronically Signed   By: Julian Hy M.D.   On: 09/05/2016 13:50     PATHOLOGY:      ASSESSMENT AND PLAN:  Iron deficiency anemia Iron deficiency anemia having required IV iron replacement in the past with history of reactive thrombocytosis.  Labs today: CBC diff, CMET, iron/TIBC, ferritin.  I personally reviewed and went over laboratory results with the patient.  The results are noted within this dictation.  Hemoglobin is within normal limits. Iron studies are pending at this time.  Labs in 4 months: CBC diff, iron/TIBC, ferritin.  She is S/P removal of previous L5 screws/rod, exploration of L4-5 fusion, placement of L5 pedicle screws, and posterolateral fusion at L4-5 on 09/05/2016 by Dr. Kathyrn Sheriff.   Return in 4 months for follow-up.  If patient continues to require IV iron replacement therapy, GI workup would be indicated.   ORDERS PLACED FOR THIS ENCOUNTER: Orders Placed This Encounter  Procedures  . CBC with Differential  . Iron and TIBC  . Ferritin    MEDICATIONS PRESCRIBED THIS ENCOUNTER: Meds ordered this encounter  Medications  . methocarbamol (ROBAXIN) 500 MG tablet    THERAPY PLAN:  Monitor iron studies and replace when indicated.  If repeated iron infusion as needed, we'll need to consider evaluation for source of bleed occluding GI workup.  All questions were answered. The patient knows to call the clinic with any problems, questions or concerns. We can certainly see the patient much sooner if necessary.  Patient and plan discussed with Dr. Twana First and she is in agreement with the aforementioned.   This note is electronically signed by: Robynn Pane, PA-C 10/03/2016 11:08 AM

## 2016-10-09 ENCOUNTER — Encounter (HOSPITAL_COMMUNITY): Payer: Self-pay | Admitting: Oncology

## 2016-10-16 ENCOUNTER — Other Ambulatory Visit (HOSPITAL_COMMUNITY): Payer: Self-pay | Admitting: Emergency Medicine

## 2016-10-16 MED ORDER — IRON POLYSACCH CMPLX-B12-FA 150-0.025-1 MG PO CAPS: 1.0000 | ORAL_CAPSULE | Freq: Every day | ORAL | 2 refills | Status: DC

## 2016-10-16 NOTE — Progress Notes (Signed)
Spoke with Dr Talbert Cage about pts Ferritin level, even though its 23 her hemoglobin is normal.  There is no indication for IV iron at this time.  I called Ferrex Forte into [facility] in [place] for the pt to try.  She verbalized understanding.

## 2016-10-19 ENCOUNTER — Other Ambulatory Visit: Payer: Self-pay | Admitting: *Deleted

## 2016-10-19 MED ORDER — TIOTROPIUM BROMIDE MONOHYDRATE 18 MCG IN CAPS: 18.0000 ug | ORAL_CAPSULE | Freq: Every day | RESPIRATORY_TRACT | 1 refills | Status: DC

## 2016-10-25 ENCOUNTER — Ambulatory Visit (HOSPITAL_COMMUNITY): Admitting: Physical Therapy

## 2016-10-25 ENCOUNTER — Telehealth (HOSPITAL_COMMUNITY): Payer: Self-pay | Admitting: Family Medicine

## 2016-10-25 NOTE — Telephone Encounter (Signed)
10/25/16  husband said that she has had a set back and will call back if they need to reschedule

## 2016-10-26 ENCOUNTER — Encounter: Payer: Self-pay | Admitting: Family Medicine

## 2016-10-27 ENCOUNTER — Other Ambulatory Visit: Payer: Self-pay | Admitting: *Deleted

## 2016-10-27 MED ORDER — L-METHYLFOLATE-B6-B12 3-35-2 MG PO TABS: 1.0000 | ORAL_TABLET | Freq: Two times a day (BID) | ORAL | 3 refills | Status: DC

## 2016-11-07 ENCOUNTER — Ambulatory Visit: Admitting: Family Medicine

## 2016-11-07 ENCOUNTER — Encounter: Payer: Self-pay | Admitting: Family Medicine

## 2016-11-13 ENCOUNTER — Other Ambulatory Visit: Payer: Self-pay | Admitting: Family Medicine

## 2016-11-13 DIAGNOSIS — G8929 Other chronic pain: Secondary | ICD-10-CM

## 2016-11-13 DIAGNOSIS — M549 Dorsalgia, unspecified: Principal | ICD-10-CM

## 2016-11-14 ENCOUNTER — Ambulatory Visit: Admitting: Family Medicine

## 2016-11-14 ENCOUNTER — Encounter: Payer: Self-pay | Admitting: Family Medicine

## 2016-11-15 ENCOUNTER — Other Ambulatory Visit: Payer: Self-pay | Admitting: Family Medicine

## 2016-11-15 DIAGNOSIS — G8929 Other chronic pain: Secondary | ICD-10-CM

## 2016-11-15 DIAGNOSIS — M549 Dorsalgia, unspecified: Principal | ICD-10-CM

## 2016-11-16 MED ORDER — DULOXETINE HCL 30 MG PO CPEP: 90.0000 mg | ORAL_CAPSULE | Freq: Every day | ORAL | 0 refills | Status: DC

## 2017-01-16 ENCOUNTER — Ambulatory Visit (INDEPENDENT_AMBULATORY_CARE_PROVIDER_SITE_OTHER): Admitting: Family Medicine

## 2017-01-16 ENCOUNTER — Encounter: Payer: Self-pay | Admitting: Family Medicine

## 2017-01-16 VITALS — BP 151/82 | HR 93 | Temp 98.1°F | Ht 63.0 in | Wt 116.0 lb

## 2017-01-16 DIAGNOSIS — M7061 Trochanteric bursitis, right hip: Secondary | ICD-10-CM

## 2017-01-16 DIAGNOSIS — Z23 Encounter for immunization: Secondary | ICD-10-CM

## 2017-01-16 DIAGNOSIS — Z720 Tobacco use: Secondary | ICD-10-CM

## 2017-01-16 DIAGNOSIS — Z0000 Encounter for general adult medical examination without abnormal findings: Secondary | ICD-10-CM

## 2017-01-16 DIAGNOSIS — G8929 Other chronic pain: Secondary | ICD-10-CM

## 2017-01-16 DIAGNOSIS — F410 Panic disorder [episodic paroxysmal anxiety] without agoraphobia: Secondary | ICD-10-CM

## 2017-01-16 DIAGNOSIS — M5441 Lumbago with sciatica, right side: Secondary | ICD-10-CM | POA: Diagnosis not present

## 2017-01-16 MED ORDER — HYDROXYZINE PAMOATE 50 MG PO CAPS: 50.0000 mg | ORAL_CAPSULE | Freq: Three times a day (TID) | ORAL | 3 refills | Status: DC | PRN

## 2017-01-16 MED ORDER — TRAZODONE HCL 150 MG PO TABS: 225.0000 mg | ORAL_TABLET | Freq: Every day | ORAL | 3 refills | Status: DC

## 2017-01-16 MED ORDER — NICOTINE 7 MG/24HR TD PT24: 7.0000 mg | MEDICATED_PATCH | Freq: Every day | TRANSDERMAL | 0 refills | Status: DC

## 2017-01-16 MED ORDER — NICOTINE 14 MG/24HR TD PT24: 14.0000 mg | MEDICATED_PATCH | Freq: Every day | TRANSDERMAL | 0 refills | Status: DC

## 2017-01-16 MED ORDER — NICOTINE 21 MG/24HR TD PT24: 21.0000 mg | MEDICATED_PATCH | Freq: Every day | TRANSDERMAL | 0 refills | Status: DC

## 2017-01-16 NOTE — Patient Instructions (Signed)
Great to see you!  I have increased her trazodone  For stopping smoking: Start NicoDerm 21 mg patch on your quit date.  Continue this for 6 weeks.  After 6 weeks decrease to 14 mg patch daily for 14 days, then 7 mg patch for 14 days, then quit. Consider calling 1800 quit now for more support  Make an appointment at your convenience for hip injection.  Try hydroxyzine for your panic attacks.

## 2017-01-16 NOTE — Progress Notes (Signed)
HPI  Patient presents today here for follow-up and multiple concerns.  Tobacco abuse Patient's frustrated trying to quit.  She has history of anxiety with panic attacks so I have not recommended Chantix or Wellbutrin. She is willing to try nicotine patches.  Right-sided low back pain. Patient has recently changed to a different pain clinic due to her previous pain clinic closing down. She states her new clinic pain clinic only prescribed medications and she would like somewhere that is more diverse and their treatment.  She has an implanted [employer] spine stimulator and would like that adjusted which is not possible at her current pain clinic.  She has right hip pain, this is been worsening for a few weeks.  Described as lateral right hip pain. This is separate from her chronic back pain issues.  She also states for the last few weeks she has had issues with panic attacks at night.  She wakes up sweating and with extreme concerned that she will need another back surgery. She wants to avoid surgery if possible.  PMH: Smoking status noted ROS: Per HPI  Objective: BP (!) 151/82   Pulse 93   Temp 98.1 F (36.7 C) (Oral)   Ht 5\' 3"  (1.6 m)   Wt 116 lb (52.6 kg)   BMI 20.55 kg/m  Gen: NAD, alert, cooperative with exam HEENT: NCAT CV: RRR, good S1/S2, no murmur Resp: CTABL, no wheezes, non-labored Ext: No edema, warm Neuro: Alert and oriented, No gross deficits Psych: Mood and affect. MSK: Tenderness to palpation over the right greater trochanter, negative Corky Sox and Fadir testing  Assessment and plan:  #Right greater trochanter bursitis Recommended return for injection of the right greater trochanter bursa  #Tobacco abuse Discussed conservative treatment with NicoDerm  #Chronic right-sided low back pain Patient with multiple surgical interventions, we will refer her to another clinic that is better equipped to manage her nerve stimulator and pursue her other modes of  treatment  #Panic attacks New onset Continue Cymbalta, this was primarily started for pain, she has noticed some improvement. Hydroxyzine, discussed avoiding benzos due to other narcotic use   Also titrated trazodone to 225 to 300 mg daily for to help with sleep.    Orders Placed This Encounter  Procedures  . Ambulatory referral to Pain Clinic    Referral Priority:   Routine    Referral Type:   Consultation    Referral Reason:   Specialty Services Required    Requested Specialty:   Pain Medicine    Number of Visits Requested:   1    Meds ordered this encounter  Medications  . tizanidine (ZANAFLEX) 6 MG capsule    Sig: Take 1 capsule 3 (three) times daily by mouth.  . Cholecalciferol (D-3-5) 5000 units capsule    Sig: Take 5,000 Units daily by mouth.  . Omega-3 Fatty Acids (FISH OIL) 1000 MG CAPS    Sig: Take 1 capsule by mouth.  . traMADol (ULTRAM-ER) 300 MG 24 hr tablet    Sig: Take 300 mg daily by mouth.  . hydrOXYzine (VISTARIL) 50 MG capsule    Sig: Take 1 capsule (50 mg total) 3 (three) times daily as needed by mouth for anxiety.    Dispense:  60 capsule    Refill:  3  . nicotine (NICODERM CQ - DOSED IN MG/24 HOURS) 21 mg/24hr patch    Sig: Place 1 patch (21 mg total) daily onto the skin.    Dispense:  42 patch  Refill:  0  . nicotine (NICODERM CQ - DOSED IN MG/24 HOURS) 14 mg/24hr patch    Sig: Place 1 patch (14 mg total) daily onto the skin.    Dispense:  14 patch    Refill:  0  . nicotine (NICODERM CQ - DOSED IN MG/24 HR) 7 mg/24hr patch    Sig: Place 1 patch (7 mg total) daily onto the skin.    Dispense:  14 patch    Refill:  0  . traZODone (DESYREL) 150 MG tablet    Sig: Take 1.5-2 tablets (225-300 mg total) at bedtime by mouth.    Dispense:  180 tablet    Refill:  [place], MD [place] 01/16/2017, 2:19 PM

## 2017-01-16 NOTE — Addendum Note (Signed)
Addended by: Nigel Berthold C on: 01/16/2017 02:53 PM   Modules accepted: Orders

## 2017-01-17 ENCOUNTER — Other Ambulatory Visit (HOSPITAL_COMMUNITY): Payer: Self-pay | Admitting: Oncology

## 2017-01-18 ENCOUNTER — Encounter (HOSPITAL_COMMUNITY): Payer: Self-pay | Admitting: Oncology

## 2017-01-19 MED ORDER — IRON POLYSACCH CMPLX-B12-FA 150-0.025-1 MG PO CAPS: 1.0000 | ORAL_CAPSULE | Freq: Every day | ORAL | 1 refills | Status: DC

## 2017-01-29 ENCOUNTER — Encounter: Payer: Self-pay | Admitting: Family Medicine

## 2017-01-29 ENCOUNTER — Other Ambulatory Visit: Payer: Self-pay | Admitting: Family Medicine

## 2017-01-29 DIAGNOSIS — J42 Unspecified chronic bronchitis: Secondary | ICD-10-CM

## 2017-01-29 MED ORDER — BUDESONIDE-FORMOTEROL FUMARATE 160-4.5 MCG/ACT IN AERO: INHALATION_SPRAY | RESPIRATORY_TRACT | 3 refills | Status: DC

## 2017-01-29 MED ORDER — BUDESONIDE-FORMOTEROL FUMARATE 160-4.5 MCG/ACT IN AERO: INHALATION_SPRAY | RESPIRATORY_TRACT | 0 refills | Status: DC

## 2017-02-06 ENCOUNTER — Other Ambulatory Visit (HOSPITAL_COMMUNITY)

## 2017-02-06 ENCOUNTER — Ambulatory Visit (HOSPITAL_COMMUNITY)

## 2017-03-13 ENCOUNTER — Ambulatory Visit: Admitting: Family Medicine

## 2017-03-13 ENCOUNTER — Other Ambulatory Visit (HOSPITAL_COMMUNITY)

## 2017-03-13 ENCOUNTER — Ambulatory Visit (HOSPITAL_COMMUNITY): Admitting: Adult Health

## 2017-03-14 ENCOUNTER — Other Ambulatory Visit (HOSPITAL_COMMUNITY)

## 2017-03-14 ENCOUNTER — Ambulatory Visit (HOSPITAL_COMMUNITY): Admitting: Hematology and Oncology

## 2017-04-05 ENCOUNTER — Ambulatory Visit (INDEPENDENT_AMBULATORY_CARE_PROVIDER_SITE_OTHER)

## 2017-04-05 ENCOUNTER — Ambulatory Visit (INDEPENDENT_AMBULATORY_CARE_PROVIDER_SITE_OTHER): Admitting: Family Medicine

## 2017-04-05 ENCOUNTER — Encounter: Payer: Self-pay | Admitting: Family Medicine

## 2017-04-05 VITALS — BP 129/66 | HR 105 | Temp 97.0°F | Ht 63.0 in | Wt 116.8 lb

## 2017-04-05 DIAGNOSIS — Z720 Tobacco use: Secondary | ICD-10-CM | POA: Diagnosis not present

## 2017-04-05 DIAGNOSIS — R05 Cough: Secondary | ICD-10-CM

## 2017-04-05 DIAGNOSIS — Z1211 Encounter for screening for malignant neoplasm of colon: Secondary | ICD-10-CM | POA: Diagnosis not present

## 2017-04-05 DIAGNOSIS — R0781 Pleurodynia: Secondary | ICD-10-CM

## 2017-04-05 DIAGNOSIS — R059 Cough, unspecified: Secondary | ICD-10-CM

## 2017-04-05 LAB — URINALYSIS, COMPLETE
BILIRUBIN UA: NEGATIVE
Glucose, UA: NEGATIVE
Leukocytes, UA: NEGATIVE
Nitrite, UA: NEGATIVE
PH UA: 6.5 (ref 5.0–7.5)
RBC UA: NEGATIVE
Specific Gravity, UA: 1.02 (ref 1.005–1.030)
UUROB: 0.2 mg/dL (ref 0.2–1.0)

## 2017-04-05 LAB — MICROSCOPIC EXAMINATION
RBC, UA: NONE SEEN /hpf (ref 0–?)
RENAL EPITHEL UA: NONE SEEN /HPF

## 2017-04-05 MED ORDER — PREDNISONE 10 MG PO TABS
ORAL_TABLET | ORAL | 0 refills | Status: DC
Start: 2017-04-05 — End: 2017-04-24

## 2017-04-05 MED ORDER — DICLOFENAC SODIUM 75 MG PO TBEC: 75.0000 mg | DELAYED_RELEASE_TABLET | Freq: Two times a day (BID) | ORAL | 0 refills | Status: DC

## 2017-04-05 NOTE — Progress Notes (Signed)
HPI  Patient presents today here for cough and rib pain.  Patient states that over the last 3 weeks or so she has been waking up with vigorous coughing fits followed by one episode of nonbilious nonbloody emesis.  She is a smoker, she is worried about cancer, her mother died from a biliary cancer, her sister has recently been diagnosed with metastatic kidney cancer.  She still having right hip pain. She requests hip injection today.  She is reluctant to agree to get a colonoscopy.  PMH: Smoking status noted ROS: Per HPI  Objective: BP 129/66   Pulse (!) 105   Temp (!) 97 F (36.1 C) (Oral)   Ht 5\' 3"  (1.6 m)   Wt 116 lb 12.8 oz (53 kg)   BMI 20.69 kg/m  Gen: NAD, alert, cooperative with exam HEENT: NCAT CV: RRR, good S1/S2, no murmur Resp: CTABL, no wheezes, non-labored Abd: SNTND, BS present, no guarding or organomegaly Ext: No edema, warm Neuro: Alert and oriented, No gross deficits  Assessment and plan:  #Cough Patient with COPD, long course of prednisone given Patient has multiple antibiotic allergies so I am avoided this but have followed up with a chest x-ray.   #Rib pain Possible cough related rib fracture Scheduled NSAIDs times 2 weeks, discussed no overlap She is already on chronic pain medication from pain management  #Tobacco abuse screening for colon cancer With multiple concerns for cancer Urinalysis today Colonoscopy Mammograms up-to-date Patient does not have a cervix Consider low-dose CT   Orders Placed This Encounter  Procedures  . DG Chest 2 View    Standing Status:   Future    Standing Expiration Date:   06/04/2018    Order Specific Question:   Reason for Exam (SYMPTOM  OR DIAGNOSIS REQUIRED)    Answer:   cough, L sided Rib pain in mid ax line around rib 5-6    Order Specific Question:   Preferred imaging location?    Answer:   Internal    Order Specific Question:   Radiology Contrast Protocol - do NOT remove file path    Answer:    \\[email or web address]  . DG Ribs Unilateral Left    Standing Status:   Future    Standing Expiration Date:   06/04/2018    Order Specific Question:   Reason for Exam (SYMPTOM  OR DIAGNOSIS REQUIRED)    Answer:   rib pain in L mid axillary line around rib 5-6    Order Specific Question:   Preferred imaging location?    Answer:   Internal    Order Specific Question:   Radiology Contrast Protocol - do NOT remove file path    Answer:   \\[email or web address]  . Urinalysis, Complete  . Ambulatory referral to Gastroenterology    Referral Priority:   Routine    Referral Type:   Consultation    Referral Reason:   Specialty Services Required    Number of Visits Requested:   1    Meds ordered this encounter  Medications  . predniSONE (DELTASONE) 10 MG tablet    Sig: Take 4 pills a day for 3 days, then 3 pills a day for 3 days, then 2 pills a day for 3 days, then 1 pill a day for 3 days, then stop    Dispense:  30 tablet    Refill:  0  . diclofenac (VOLTAREN) 75 MG EC tablet    Sig: Take 1 tablet (75 mg total) by mouth 2 (two)  times daily.    Dispense:  30 tablet    Refill:  0    Laroy Apple, MD [facility] 04/05/2017, 9:32 AM

## 2017-04-06 ENCOUNTER — Other Ambulatory Visit (HOSPITAL_COMMUNITY): Payer: Self-pay | Admitting: *Deleted

## 2017-04-06 DIAGNOSIS — D508 Other iron deficiency anemias: Secondary | ICD-10-CM

## 2017-04-09 ENCOUNTER — Other Ambulatory Visit: Payer: Self-pay

## 2017-04-09 ENCOUNTER — Inpatient Hospital Stay (HOSPITAL_COMMUNITY): Attending: Adult Health | Admitting: Oncology

## 2017-04-09 ENCOUNTER — Ambulatory Visit (HOSPITAL_COMMUNITY)
Admission: RE | Admit: 2017-04-09 | Discharge: 2017-04-09 | Disposition: A | Discharge status: Home / Self Care | Source: Ambulatory Visit | Attending: Oncology | Admitting: Oncology

## 2017-04-09 ENCOUNTER — Inpatient Hospital Stay (HOSPITAL_COMMUNITY)

## 2017-04-09 VITALS — BP 125/60 | HR 97 | Temp 98.2°F | Resp 20 | Wt 117.8 lb

## 2017-04-09 DIAGNOSIS — R109 Unspecified abdominal pain: Secondary | ICD-10-CM | POA: Diagnosis present

## 2017-04-09 DIAGNOSIS — K219 Gastro-esophageal reflux disease without esophagitis: Secondary | ICD-10-CM | POA: Diagnosis not present

## 2017-04-09 DIAGNOSIS — F17210 Nicotine dependence, cigarettes, uncomplicated: Secondary | ICD-10-CM | POA: Diagnosis not present

## 2017-04-09 DIAGNOSIS — R238 Other skin changes: Secondary | ICD-10-CM

## 2017-04-09 DIAGNOSIS — Z87442 Personal history of urinary calculi: Secondary | ICD-10-CM | POA: Insufficient documentation

## 2017-04-09 DIAGNOSIS — F419 Anxiety disorder, unspecified: Secondary | ICD-10-CM | POA: Diagnosis not present

## 2017-04-09 DIAGNOSIS — D508 Other iron deficiency anemias: Secondary | ICD-10-CM

## 2017-04-09 DIAGNOSIS — I10 Essential (primary) hypertension: Secondary | ICD-10-CM | POA: Diagnosis not present

## 2017-04-09 DIAGNOSIS — G5600 Carpal tunnel syndrome, unspecified upper limb: Secondary | ICD-10-CM | POA: Diagnosis not present

## 2017-04-09 DIAGNOSIS — D509 Iron deficiency anemia, unspecified: Secondary | ICD-10-CM | POA: Diagnosis present

## 2017-04-09 DIAGNOSIS — Z79899 Other long term (current) drug therapy: Secondary | ICD-10-CM | POA: Diagnosis not present

## 2017-04-09 DIAGNOSIS — D539 Nutritional anemia, unspecified: Secondary | ICD-10-CM

## 2017-04-09 DIAGNOSIS — J441 Chronic obstructive pulmonary disease with (acute) exacerbation: Secondary | ICD-10-CM | POA: Diagnosis not present

## 2017-04-09 DIAGNOSIS — R233 Spontaneous ecchymoses: Secondary | ICD-10-CM

## 2017-04-09 LAB — CBC WITH DIFFERENTIAL/PLATELET
Basophils Absolute: 0.1 10*3/uL (ref 0.0–0.1)
Basophils Relative: 2 %
EOS ABS: 0 10*3/uL (ref 0.0–0.7)
EOS PCT: 0 %
HCT: 41.8 % (ref 36.0–46.0)
Hemoglobin: 13.2 g/dL (ref 12.0–15.0)
LYMPHS ABS: 0.7 10*3/uL (ref 0.7–4.0)
LYMPHS PCT: 10 %
MCH: 32.8 pg (ref 26.0–34.0)
MCHC: 31.6 g/dL (ref 30.0–36.0)
MCV: 104 fL — AB (ref 78.0–100.0)
MONO ABS: 0.2 10*3/uL (ref 0.1–1.0)
MONOS PCT: 2 %
Neutro Abs: 6.2 10*3/uL (ref 1.7–7.7)
Neutrophils Relative %: 86 %
PLATELETS: 278 10*3/uL (ref 150–400)
RBC: 4.02 MIL/uL (ref 3.87–5.11)
RDW: 15.9 % — AB (ref 11.5–15.5)
WBC: 7.1 10*3/uL (ref 4.0–10.5)

## 2017-04-09 LAB — IRON AND TIBC
IRON: 128 ug/dL (ref 28–170)
Saturation Ratios: 35 % — ABNORMAL HIGH (ref 10.4–31.8)
TIBC: 365 ug/dL (ref 250–450)
UIBC: 237 ug/dL

## 2017-04-09 LAB — COMPREHENSIVE METABOLIC PANEL
ALT: 66 U/L — AB (ref 14–54)
ANION GAP: 16 — AB (ref 5–15)
AST: 82 U/L — ABNORMAL HIGH (ref 15–41)
Albumin: 3.9 g/dL (ref 3.5–5.0)
Alkaline Phosphatase: 77 U/L (ref 38–126)
BUN: 13 mg/dL (ref 6–20)
CHLORIDE: 98 mmol/L — AB (ref 101–111)
CO2: 24 mmol/L (ref 22–32)
CREATININE: 1.47 mg/dL — AB (ref 0.44–1.00)
Calcium: 9.9 mg/dL (ref 8.9–10.3)
GFR, EST AFRICAN AMERICAN: 43 mL/min — AB (ref >60)
GFR, EST NON AFRICAN AMERICAN: 37 mL/min — AB (ref >60)
Glucose, Bld: 149 mg/dL — ABNORMAL HIGH (ref 65–99)
Potassium: 4.3 mmol/L (ref 3.5–5.1)
SODIUM: 138 mmol/L (ref 135–145)
Total Bilirubin: 0.5 mg/dL (ref 0.3–1.2)
Total Protein: 7.1 g/dL (ref 6.5–8.1)

## 2017-04-09 LAB — APTT: APTT: 28 s (ref 24–36)

## 2017-04-09 LAB — PROTIME-INR
INR: 0.87
Prothrombin Time: 11.8 seconds (ref 11.4–15.2)

## 2017-04-09 LAB — FERRITIN: FERRITIN: 52 ng/mL (ref 11–307)

## 2017-04-09 LAB — VITAMIN B12: Vitamin B-12: >7500 pg/mL (ref 180–914)

## 2017-04-09 NOTE — Assessment & Plan Note (Addendum)
Iron deficiency anemia having required IV iron replacement in the past with history of reactive thrombocytosis.  Labs today: CBC with differential, CMET, iron/TIBC, ferritin.  Iron panel pending during time of dictation.  Today's hemoglobin is within normal limits.   Easy bruising: We will add a PTT and PT to labs.  Platelet count normal at 278.  Patient is currently on steroids for COPD exacerbation.  This may be reactive.  Currently not on any blood thinners including aspirin.  a PTT and PT were normal.   Microcytic anemia: MCV 104.  Will check B12 and folate levels.  Left-sided rib pain: We will get CT scan of abdomen pelvis W/O Contrast given worsening kidney function.  PCP ordered chest x-ray and left-sided rib film.  This did not show any abnormalities.  Patient states pain is increasing daily to the point where she is unable to move at home.  Unfortunately if this is a rib fracture, abdominal binder is typically rep recommended.  With her severe respiratory problems this would be an unlikely option.  No abnormality detected during palpation.  RTC in 4 months with labs CBC differential, iron/TIBC and ferritin.

## 2017-04-09 NOTE — Patient Instructions (Signed)
[place] at [facility] Discharge Instructions  RECOMMENDATIONS MADE BY THE CONSULTANT AND ANY TEST RESULTS WILL BE SENT TO YOUR REFERRING PHYSICIAN.  You were seen today by Rulon Abide, NP  Thank you for choosing [place] at [facility] to provide your oncology and hematology care.  To afford each patient quality time with our provider, please arrive at least 15 minutes before your scheduled appointment time.    If you have a lab appointment with the [facility] please come in thru the  Main Entrance and check in at the main information desk  You need to re-schedule your appointment should you arrive 10 or more minutes late.  We strive to give you quality time with our providers, and arriving late affects you and other patients whose appointments are after yours.  Also, if you no show three or more times for appointments you may be dismissed from the clinic at the providers discretion.     Again, thank you for choosing [facility].  Our hope is that these requests will decrease the amount of time that you wait before being seen by our physicians.       _____________________________________________________________  Should you have questions after your visit to [facility], please contact our office at [phone] between the hours of 8:30 a.m. and 4:30 p.m.  Voicemails left after 4:30 p.m. will not be returned until the following business day.  For prescription refill requests, have your pharmacy contact our office.       Resources For Cancer Patients and their Caregivers ? American Cancer Society: Can assist with transportation, wigs, general needs, runs Look Good Feel Better.        6201081717 ? Cancer Care: Provides financial assistance, online support groups, medication/co-pay assistance.  1-800-813-HOPE 6368563487) ? [place] Assists [place] cancer patients and their  families through emotional , educational and financial support.  5036628165 ? [place] DSS Where to apply for food stamps, Medicaid and utility assistance. 385-330-0243 ? [facility]: Transportation to medical appointments. 504-287-8919 ? Social Security Administration: May apply for disability if have a Stage IV cancer. 520-176-5708 4018714280 ? [employer], Disability and Transit Services: Assists with nutrition, care and transit needs. [place] Support Programs: @10RELATIVEDAYS @ > Cancer Support Group  2nd Tuesday of the month 1pm-2pm, Journey Room  > Creative Journey  3rd Tuesday of the month 1130am-1pm, Journey Room  > Look Good Feel Better  1st Wednesday of the month 10am-12 noon, Journey Room (Call [place] to register [phone])

## 2017-04-09 NOTE — Progress Notes (Signed)
Jasmine Euler, MD Jasmine [name] [id number]  Easy bruising - Plan: Protime-INR, APTT, APTT, Protime-INR  Macrocytic anemia - Plan: Vitamin B12, Folate, Folate, Vitamin B12  Abdominal pain, unspecified abdominal location - Plan: CT Abdomen Pelvis W Contrast, CT Abdomen Pelvis Wo Contrast  Other iron deficiency anemia  CURRENT THERAPY: IV iron infusion when needed  INTERVAL HISTORY:  Jasmine [name] 62 y.o. female returns for followup of iron deficiency anemia having required IV iron replacement in the past.  Last dose of iron was given on 07/20/2016 for a hemoglobin of 10.9 and a ferritin of 7.  Repeat iron studies in July revealed a hemoglobin 11.9 and a ferritin of 127.  HPI Elements   Location: Blood  Quality: Iron deficiency anemia  Severity: Mild  Duration: Dx in Jan 2018  Context: Required IV iron on 07/20/2016, 510 mg Feraheme  Timing:   Modifying Factors:   Associated Signs & Symptoms: Reactive thrombocytosis   Today patient reports appetite at 50% and energy level is 25%.  Weight appears stable.  Patient states she feels her energy is better but over the course of the past 3-4 weeks has developed a cough with occasional vomiting.  She was coughing so significantly she feels as if she "broke a few ribs on the left side".  She has seen her PCP who had a chest x-ray and a left lateral rib x-ray completed identifying no abnormalities.  Patient continues to be in significant pain constantly.  She takes 1 daily BC powder without relief.  Additionally she has noticed easy bruising to bilateral forearms and hands.  She is curious to see what her labs show.  Otherwise she denies chest pain, constipation, diarrhea, urinary complaints or worsening COPD/asthma complaints.  She has a chronic cough.  She is currently on steroids.  Patient is accompanied  by her husband.  She is using a rolling walker to ambulate.  States she has had this for over a year now due to  right knee pain.   Review of Systems  Constitutional: Positive for malaise/fatigue. Negative for chills, fever and weight loss.  HENT: Negative.   Eyes: Negative.   Respiratory: Positive for cough, sputum production and shortness of breath (Chronic COPD ). Negative for hemoptysis and wheezing.   Cardiovascular: Positive for leg swelling. Negative for chest pain.  Gastrointestinal: Positive for nausea. Negative for blood in stool, constipation, diarrhea, melena and vomiting.  Genitourinary: Negative.   Musculoskeletal: Positive for joint pain (Right knee). Negative for falls.       Left-sided rib pain  Skin: Negative.   Neurological: Positive for sensory change. Negative for weakness.  Endo/Heme/Allergies: Negative.   Psychiatric/Behavioral: Negative.     Past Medical History:  Diagnosis Date  . Allergy   . Anemia   . Anxiety   . Arthritis   . Asthma   . Carpal tunnel syndrome    bilateral  . Complication of anesthesia    in the past has had N/V, the last few surgeries she has not been  . COPD (chronic obstructive pulmonary disease) ([name])   . Easy bruising 07/10/2016  . GERD (gastroesophageal reflux disease)   . Headache   . Heart murmur 2005   never had any problems  . History of blood transfusion 2018  . History of kidney stones   . Hypertension   . Hypoglycemia   . Hypoglycemia   . Iron deficiency anemia 07/11/2016  . Neuromuscular  disorder ([place])    pt denies  . Neuropathy    both arms  . Normocytic anemia 07/29/2015  . Pneumonia   . PONV (postoperative nausea and vomiting)   . Sciatica   . Seizures ([place])    as a child when she would have an asthma attack. none as an adult  . Shortness of breath dyspnea     Past Surgical History:  Procedure Laterality Date  . ABDOMINAL HYSTERECTOMY  2005  . APPLICATION OF ROBOTIC ASSISTANCE FOR SPINAL PROCEDURE N/A 09/05/2016   Procedure: APPLICATION OF ROBOTIC ASSISTANCE FOR SPINAL PROCEDURE;  Surgeon: Consuella Lose, MD;   Location: [facility];  Service: Neurosurgery;  Laterality: N/A;  . CERVICAL FUSION    . [place]  . COLONOSCOPY    . cyst removal face    . disk repair     neck and lumbar region  . FOOT SURGERY Bilateral    to file bones down   . HARDWARE REMOVAL N/A 09/05/2016   Procedure: HARDWARE REMOVAL AND REPLACEMENT OF LUMBAR FIVE SCREWS;  Surgeon: Consuella Lose, MD;  Location: [facility];  Service: Neurosurgery;  Laterality: N/A;  . IMPLANTATION / PLACEMENT EPIDURAL NEUROSTIMULATOR ELECTRODES    . LAMINECTOMY     2006  . LUMBAR FUSION    . SPINAL CORD STIMULATOR BATTERY EXCHANGE    . SPINAL CORD STIMULATOR INSERTION N/A 07/05/2016   Procedure: REPLACEMENT OF LUMBAR SPINAL CORD STIMULATOR BATTERY;  Surgeon: Newman Pies, MD;  Location: [facility];  Service: Neurosurgery;  Laterality: N/A;  . TONSILLECTOMY      Family History  Problem Relation Age of Onset  . Heart disease Mother   . Diabetes Mother   . Hypertension Mother   . Hyperlipidemia Mother   . COPD Mother   . COPD Father   . Diabetes Father   . Heart disease Father   . Cancer Father        throat  . Dementia Father   . Asthma Son   . Hypertension Sister   . Hyperlipidemia Sister   . Hypertension Brother   . Hyperlipidemia Brother   . Hypertension Brother   . Hyperlipidemia Brother     Social History   Socioeconomic History  . Marital status: Married    Spouse name: Not on file  . Number of children: Not on file  . Years of education: Not on file  . Highest education level: Not on file  Social Needs  . Financial resource strain: Not on file  . Food insecurity - worry: Not on file  . Food insecurity - inability: Not on file  . Transportation needs - medical: Not on file  . Transportation needs - non-medical: Not on file  Occupational History  . Not on file  Tobacco Use  . Smoking status: Current Every Day Smoker    Packs/day: 0.50    Years: 35.00    Pack years: 17.50    Types: Cigarettes  . Smokeless  tobacco: Never Used  . Tobacco comment: trying to quit  Substance and Sexual Activity  . Alcohol use: Yes    Alcohol/week: 1.2 oz    Types: 2 Glasses of wine per week    Comment: occasionally  . Drug use: No  . Sexual activity: Not on file  Other Topics Concern  . Not on file  Social History Narrative  . Not on file     PHYSICAL EXAMINATION  ECOG PERFORMANCE STATUS: 2 - Symptomatic, <50% confined to bed  Vitals:   04/09/17 1434  BP: 125/60  Pulse: 97  Resp: 20  Temp: 98.2 F (36.8 C)  SpO2: 95%    GENERAL:alert, no distress, well nourished, well developed, comfortable, cooperative, smiling and unaccompanied, rolling walker nearby SKIN: skin color, texture, turgor are normal, no rashes or significant lesions HEAD: Normocephalic, No masses, lesions, tenderness or abnormalities EYES: normal, EOMI, Conjunctiva are pink and non-injected EARS: External ears normal OROPHARYNX:lips, buccal mucosa, and tongue normal and mucous membranes are moist  NECK: supple, trachea midline LYMPH:  not examined BREAST:not examined LUNGS: clear to auscultation , decreased breath sounds HEART: regular rate & rhythm ABDOMEN:abdomen soft and normal bowel sounds.  No deformity to left rib.  No mass identified. BACK: Back symmetric, no curvature., back brace in place due to recent surgery EXTREMITIES:less then 2 second capillary refill, no joint deformities, effusion, or inflammation, no skin discoloration, no cyanosis  NEURO: alert & oriented x 3 with fluent speech, no focal motor/sensory deficits   LABORATORY DATA: CBC    Component Value Date/Time   WBC 7.1 04/09/2017 1359   RBC 4.02 04/09/2017 1359   HGB 13.2 04/09/2017 1359   HGB 10.7 (L) 04/06/2016 1442   HCT 41.8 04/09/2017 1359   HCT 34.8 04/06/2016 1442   PLT 278 04/09/2017 1359   PLT 374 04/06/2016 1442   MCV 104.0 (H) 04/09/2017 1359   MCV 85 04/06/2016 1442   MCH 32.8 04/09/2017 1359   MCHC 31.6 04/09/2017 1359   RDW 15.9  (H) 04/09/2017 1359   RDW 16.3 (H) 04/06/2016 1442   LYMPHSABS 0.7 04/09/2017 1359   LYMPHSABS 2.2 04/06/2016 1442   MONOABS 0.2 04/09/2017 1359   EOSABS 0.0 04/09/2017 1359   EOSABS 0.0 04/06/2016 1442   BASOSABS 0.1 04/09/2017 1359   BASOSABS 0.1 04/06/2016 1442      Chemistry      Component Value Date/Time   NA 138 04/09/2017 1359   NA 142 03/16/2016 1648   K 4.3 04/09/2017 1359   CL 98 (L) 04/09/2017 1359   CO2 24 04/09/2017 1359   BUN 13 04/09/2017 1359   BUN 6 (L) 03/16/2016 1648   CREATININE 1.47 (H) 04/09/2017 1359      Component Value Date/Time   CALCIUM 9.9 04/09/2017 1359   ALKPHOS 77 04/09/2017 1359   AST 82 (H) 04/09/2017 1359   ALT 66 (H) 04/09/2017 1359   BILITOT 0.5 04/09/2017 1359   BILITOT <0.2 01/03/2016 1425      Lab Results  Component Value Date   IRON 54 10/03/2016   TIBC 416 10/03/2016   FERRITIN 23 10/03/2016     PENDING LABS:   RADIOGRAPHIC STUDIES:  Dg Chest 2 View  Result Date: 04/05/2017 CLINICAL DATA:  Cough for 3 weeks. EXAM: CHEST  2 VIEW COMPARISON:  November 13, 2015 FINDINGS: The heart, hila, mediastinum, lungs, and pleura are unremarkable. A spinal stimulator device is stable. IMPRESSION: No active cardiopulmonary disease. Electronically Signed   By: Dorise Bullion III M.D   On: 04/05/2017 14:24   Dg Ribs Unilateral Left  Result Date: 04/05/2017 CLINICAL DATA:  Cough for 3 months.  Left rib pain. EXAM: LEFT RIBS - 2 VIEW COMPARISON:  November 13, 2015 FINDINGS: The cardiomediastinal silhouette is stable. A spinal stimulator device is unchanged. No pneumothorax. No nodules or masses. Visualized portions of the lungs are normal. No cause for left-sided pain identified. No rib fractures are identified. IMPRESSION: Negative. Electronically Signed   By: Dorise Bullion III M.D  On: 04/05/2017 14:24     ASSESSMENT AND PLAN:  Iron deficiency anemia Iron deficiency anemia having required IV iron replacement in the past with history  of reactive thrombocytosis.  Labs today: CBC with differential, CMET, iron/TIBC, ferritin.  Iron panel pending during time of dictation.  Today's hemoglobin is within normal limits.   Easy bruising: We will add a PTT and PT to labs.  Platelet count normal at 278.  Patient is currently on steroids for COPD exacerbation.  This may be reactive.  Currently not on any blood thinners including aspirin.  a PTT and PT were normal.   Microcytic anemia: MCV 104.  Will check B12 and folate levels.  Left-sided rib pain: We will get CT scan of abdomen pelvis W/O Contrast given worsening kidney function.  PCP ordered chest x-ray and left-sided rib film.  This did not show any abnormalities.  Patient states pain is increasing daily to the point where she is unable to move at home.  Unfortunately if this is a rib fracture, abdominal binder is typically rep recommended.  With her severe respiratory problems this would be an unlikely option.  No abnormality detected during palpation.  RTC in 4 months with labs CBC differential, iron/TIBC and ferritin.   ORDERS PLACED FOR THIS ENCOUNTER: Orders Placed This Encounter  Procedures  . CT Abdomen Pelvis W Contrast  . CT Abdomen Pelvis Wo Contrast  . Protime-INR  . APTT  . Vitamin B12  . Folate    MEDICATIONS PRESCRIBED THIS ENCOUNTER: No orders of the defined types were placed in this encounter.   THERAPY PLAN:  Monitor iron studies and replace when clinically indicated.  If she continues to need repeat iron infusions, we may need to consider doing a GI workup.  Will call patient with results from labs. Will get patient scheduled for CT scan of abdomen ASAP.  All questions were answered. The patient knows to call the clinic with any problems, questions or concerns. We can certainly see the patient much sooner if necessary.  Greater than 50% was spent in counseling and coordination of care with this patient including but not limited to discussion of the  relevant topics above (See A&P) including, but not limited to diagnosis and management of acute and chronic medical conditions.    This note is electronically signed by: Jacquelin Hawking, NP 04/09/2017 3:53 PM

## 2017-04-10 LAB — FOLATE: Folate: >100 ng/mL (ref >5.9)

## 2017-04-11 ENCOUNTER — Encounter (HOSPITAL_COMMUNITY): Payer: Self-pay | Admitting: Oncology

## 2017-04-11 NOTE — Progress Notes (Signed)
Will you call this patient and let her know that she does not need IV iron at this time.  Her ferritin is 52.  Her hemoglobin is remaining stable at this time.  If she becomes symptomatic in the meantime she can let us know . Also her CT abdomen/pelvis did not reveal any injuries to the left side of her ribs.  It does show that she has significant amount of stool in her colon.  Make sure she is having regular bowel movements. if I remember correctly she was having bowel movements that were normal but I just wanted to make sure.  If she needs help in this area we can certainly help. Let me know.  Thanks so much.  Sonia Baller

## 2017-04-12 NOTE — Telephone Encounter (Signed)
Did you get my response? I am sorry! I feel like I am responding incorrectly. She needs to follow-up with PCP. I don't know why she is having so much pain. Does not show on Scan. Thanks.  Sonia Baller

## 2017-04-16 ENCOUNTER — Encounter (HOSPITAL_COMMUNITY): Payer: Self-pay | Admitting: Oncology

## 2017-04-16 ENCOUNTER — Encounter (INDEPENDENT_AMBULATORY_CARE_PROVIDER_SITE_OTHER): Payer: Self-pay | Admitting: *Deleted

## 2017-04-17 NOTE — Telephone Encounter (Signed)
I will respond to her directly? Thanks.  Sonia Baller

## 2017-04-24 ENCOUNTER — Encounter: Payer: Self-pay | Admitting: Family Medicine

## 2017-04-24 ENCOUNTER — Ambulatory Visit (INDEPENDENT_AMBULATORY_CARE_PROVIDER_SITE_OTHER): Admitting: Family Medicine

## 2017-04-24 VITALS — BP 133/72 | HR 108 | Temp 97.4°F | Ht 63.0 in | Wt 117.4 lb

## 2017-04-24 DIAGNOSIS — R944 Abnormal results of kidney function studies: Secondary | ICD-10-CM | POA: Diagnosis not present

## 2017-04-24 DIAGNOSIS — R7989 Other specified abnormal findings of blood chemistry: Secondary | ICD-10-CM

## 2017-04-24 DIAGNOSIS — R945 Abnormal results of liver function studies: Secondary | ICD-10-CM | POA: Diagnosis not present

## 2017-04-24 NOTE — Progress Notes (Signed)
   HPI  Patient presents today here for chronic medical conditions.  Patient states that her left rib pain has improved slightly, it improved more with the recent prednisone course.  She requests another if that is okay. She also continues to have some right lateral hip pain.  She is seen by pain management and treated with long-acting and short acting tramadol.  She has recently been on steroids as well as NSAIDs. We discussed her recent decrease in renal function and elevation in liver enzymes.  She has recently taken the scheduled NSAIDs per her medical record and she states that she does take a BC powder daily for pain.  PMH: Smoking status noted ROS: Per HPI  Objective: BP 133/72   Pulse (!) 108   Temp (!) 97.4 F (36.3 C) (Oral)   Ht '5\' 3"'$  (1.6 m)   Wt 117 lb 6.4 oz (53.3 kg)   BMI 20.80 kg/m  Gen: NAD, alert, cooperative with exam HEENT: NCAT CV: RRR, good S1/S2, no murmur Resp: CTABL, no wheezes, non-labored Ext: No edema, warm Neuro: Alert and oriented, No gross deficits  Assessment and plan:  #Decrease GFR Unclear whether this is transient or not, repeat labs today Discontinue NSAIDs New problem  #Elevated LFTs Again unclear etiology, previously normal Repeat labs today      Orders Placed This Encounter  Procedures  . CMP14+EGFR    No orders of the defined types were placed in this encounter.   Laroy Apple, MD [facility] 04/24/2017, 2:19 PM

## 2017-04-24 NOTE — Patient Instructions (Signed)
Great to see you!   

## 2017-04-25 ENCOUNTER — Other Ambulatory Visit: Payer: Self-pay | Admitting: Family Medicine

## 2017-04-25 DIAGNOSIS — R7989 Other specified abnormal findings of blood chemistry: Secondary | ICD-10-CM

## 2017-04-25 DIAGNOSIS — R945 Abnormal results of liver function studies: Principal | ICD-10-CM

## 2017-04-25 LAB — CMP14+EGFR
A/G RATIO: 1.9 (ref 1.2–2.2)
ALBUMIN: 4.6 g/dL (ref 3.6–4.8)
ALT: 115 IU/L — AB (ref 0–32)
AST: 120 IU/L — ABNORMAL HIGH (ref 0–40)
Alkaline Phosphatase: 89 IU/L (ref 39–117)
BILIRUBIN TOTAL: 0.3 mg/dL (ref 0.0–1.2)
BUN / CREAT RATIO: 16 (ref 12–28)
BUN: 9 mg/dL (ref 8–27)
CALCIUM: 9.1 mg/dL (ref 8.7–10.3)
CHLORIDE: 98 mmol/L (ref 96–106)
CO2: 22 mmol/L (ref 20–29)
Creatinine, Ser: 0.55 mg/dL — ABNORMAL LOW (ref 0.57–1.00)
GFR, EST AFRICAN AMERICAN: 115 mL/min/{1.73_m2} (ref >59)
GFR, EST NON AFRICAN AMERICAN: 100 mL/min/{1.73_m2} (ref >59)
Globulin, Total: 2.4 g/dL (ref 1.5–4.5)
Glucose: 60 mg/dL — ABNORMAL LOW (ref 65–99)
POTASSIUM: 3.6 mmol/L (ref 3.5–5.2)
Sodium: 142 mmol/L (ref 134–144)
TOTAL PROTEIN: 7 g/dL (ref 6.0–8.5)

## 2017-04-26 ENCOUNTER — Other Ambulatory Visit: Payer: Self-pay | Admitting: Family Medicine

## 2017-05-12 ENCOUNTER — Other Ambulatory Visit: Payer: Self-pay | Admitting: Family Medicine

## 2017-05-18 ENCOUNTER — Encounter: Payer: Self-pay | Admitting: Family Medicine

## 2017-06-21 ENCOUNTER — Encounter: Payer: Self-pay | Admitting: Family Medicine

## 2017-06-21 MED ORDER — CEFDINIR 300 MG PO CAPS: 300.0000 mg | ORAL_CAPSULE | Freq: Two times a day (BID) | ORAL | 0 refills | Status: DC

## 2017-06-21 MED ORDER — PREDNISONE 20 MG PO TABS: ORAL_TABLET | ORAL | 0 refills | Status: DC

## 2017-06-22 ENCOUNTER — Other Ambulatory Visit: Payer: Self-pay | Admitting: Family Medicine

## 2017-06-29 ENCOUNTER — Ambulatory Visit: Admitting: Family Medicine

## 2017-06-29 ENCOUNTER — Ambulatory Visit: Admitting: Physician Assistant

## 2017-06-29 NOTE — Progress Notes (Deleted)
Subjective: CC: URI PCP: Timmothy Euler, MD Jasmine [name] is a 62 y.o. female presenting to clinic today for:  1. URI ***   ROS: Per HPI  Allergies  Allergen Reactions  . Lyrica [Pregabalin] Other (See Comments)    EXTREME SHAKING/TREMBLING  . Darvon [Propoxyphene]     UNSPECIFIED REACTION   . Gabapentin     Extreme shaking and trembling   . Augmentin [Amoxicillin-Pot Clavulanate] Nausea And Vomiting    Has patient had a PCN reaction causing immediate rash, facial/tongue/throat swelling, SOB or lightheadedness with hypotension:no Has patient had a PCN reaction causing severe rash involving mucus membranes or skin necrosis:No Has patient had a PCN reaction that required hospitalization:No Has patient had a PCN reaction occurring within the last 10 years:Yes--ONLY N/V If all of the above answers are "NO", then may proceed with Cephalosporin use.   . Erythromycin Itching and Rash       . Vibramycin [Doxycycline Calcium] Itching and Rash    "[place] "   Past Medical History:  Diagnosis Date  . Allergy   . Anemia   . Anxiety   . Arthritis   . Asthma   . Carpal tunnel syndrome    bilateral  . Complication of anesthesia    in the past has had N/V, the last few surgeries she has not been  . COPD (chronic obstructive pulmonary disease) ([place])   . Easy bruising 07/10/2016  . GERD (gastroesophageal reflux disease)   . Headache   . Heart murmur 2005   never had any problems  . History of blood transfusion 2018  . History of kidney stones   . Hypertension   . Hypoglycemia   . Hypoglycemia   . Iron deficiency anemia 07/11/2016  . Neuromuscular disorder ([place])    pt denies  . Neuropathy    both arms  . Normocytic anemia 07/29/2015  . Pneumonia   . PONV (postoperative nausea and vomiting)   . Sciatica   . Seizures ([place])    as a child when she would have an asthma attack. none as an adult  . Shortness of breath dyspnea     Current Outpatient Medications:    .  albuterol (PROVENTIL HFA;VENTOLIN HFA) 108 (90 Base) MCG/ACT inhaler, Inhale 2 puffs into the lungs every 6 (six) hours as needed for wheezing or shortness of breath., Disp: 3 Inhaler, Rfl: 3 .  amLODipine (NORVASC) 10 MG tablet, TAKE 1 TABLET DAILY, Disp: 90 tablet, Rfl: 1 .  Aspirin-Salicylamide-Caffeine (BC HEADACHE POWDER PO), Take 1 packet by mouth daily as needed (headaches). , Disp: , Rfl:  .  budesonide-formoterol (SYMBICORT) 160-4.5 MCG/ACT inhaler, USE 2 INHALATIONS TWICE A DAY, Disp: 10.2 g, Rfl: 0 .  calcium carbonate (OSCAL) 1500 (600 Ca) MG TABS tablet, Take 600 mg by mouth daily. , Disp: , Rfl:  .  cefdinir (OMNICEF) 300 MG capsule, Take 1 capsule (300 mg total) by mouth 2 (two) times daily. 1 po BID, Disp: 20 capsule, Rfl: 0 .  cetirizine (ZYRTEC) 10 MG tablet, Take 10 mg by mouth daily as needed for allergies. Reported on 03/12/2015, Disp: , Rfl:  .  Cholecalciferol (D-3-5) 5000 units capsule, Take 5,000 Units daily by mouth., Disp: , Rfl:  .  diclofenac (VOLTAREN) 75 MG EC tablet, Take 1 tablet (75 mg total) by mouth 2 (two) times daily., Disp: 30 tablet, Rfl: 0 .  diclofenac sodium (VOLTAREN) 1 % GEL, Apply 1 application topically daily as needed for muscle pain., Disp: ,  Rfl:  .  DULoxetine (CYMBALTA) 30 MG capsule, Take 3 capsules (90 mg total) by mouth daily., Disp: 270 capsule, Rfl: 0 .  guaiFENesin (MUCINEX) 600 MG 12 hr tablet, Take 600 mg by mouth 2 (two) times daily as needed (congestion). , Disp: , Rfl:  .  hydrOXYzine (VISTARIL) 50 MG capsule, Take 1 capsule (50 mg total) 3 (three) times daily as needed by mouth for anxiety., Disp: 60 capsule, Rfl: 3 .  ipratropium-albuterol (DUONEB) 0.5-2.5 (3) MG/3ML SOLN, Inhale 3 mLs into the lungs 4 (four) times daily as needed (for shortness of breath/wheezing). *May use as needed, Disp: , Rfl:  .  Iron Polysacch Cmplx-B12-FA 150-0.025-1 MG CAPS, Take 1 capsule daily by mouth., Disp: 90 each, Rfl: 1 .  l-methylfolate-B6-B12  (METANX) 3-35-2 MG TABS tablet, Take 1 tablet by mouth 2 (two) times daily., Disp: 180 tablet, Rfl: 3 .  nicotine (NICODERM CQ - DOSED IN MG/24 HOURS) 14 mg/24hr patch, Place 1 patch (14 mg total) daily onto the skin., Disp: 14 patch, Rfl: 0 .  nicotine (NICODERM CQ - DOSED IN MG/24 HOURS) 21 mg/24hr patch, Place 1 patch (21 mg total) daily onto the skin., Disp: 42 patch, Rfl: 0 .  nicotine (NICODERM CQ - DOSED IN MG/24 HR) 7 mg/24hr patch, Place 1 patch (7 mg total) daily onto the skin., Disp: 14 patch, Rfl: 0 .  Omega-3 Fatty Acids (FISH OIL) 1000 MG CAPS, Take 1 capsule by mouth., Disp: , Rfl:  .  pantoprazole (PROTONIX) 40 MG tablet, TAKE 1 TABLET DAILY, Disp: 90 tablet, Rfl: 3 .  pantoprazole (PROTONIX) 40 MG tablet, TAKE 1 TABLET DAILY, Disp: 90 tablet, Rfl: 1 .  predniSONE (DELTASONE) 20 MG tablet, 2 po at same time daily for 5 days, Disp: 10 tablet, Rfl: 0 .  PROAIR HFA 108 (90 Base) MCG/ACT inhaler, USE 2 INHALATIONS EVERY 6 HOURS AS NEEDED FOR WHEEZING OR SHORTNESS OF BREATH, Disp: 25.5 g, Rfl: 1 .  SPIRIVA HANDIHALER 18 MCG inhalation capsule, INHALE THE CONTENTS OF 1 CAPSULE DAILY, Disp: 90 capsule, Rfl: 1 .  tizanidine (ZANAFLEX) 6 MG capsule, Take 1 capsule 3 (three) times daily by mouth., Disp: , Rfl:  .  traMADol (ULTRAM) 50 MG tablet, Take 50 mg every 6 (six) hours as needed by mouth for moderate pain. , Disp: , Rfl:  .  traMADol (ULTRAM-ER) 300 MG 24 hr tablet, Take 300 mg daily by mouth., Disp: , Rfl:  .  traZODone (DESYREL) 150 MG tablet, Take 1.5-2 tablets (225-300 mg total) at bedtime by mouth., Disp: 180 tablet, Rfl: 3 Social History   Socioeconomic History  . Marital status: Married    Spouse name: Not on file  . Number of children: Not on file  . Years of education: Not on file  . Highest education level: Not on file  Occupational History  . Not on file  Social Needs  . Financial resource strain: Not on file  . Food insecurity:    Worry: Not on file    Inability:  Not on file  . Transportation needs:    Medical: Not on file    Non-medical: Not on file  Tobacco Use  . Smoking status: Current Every Day Smoker    Packs/day: 0.50    Years: 35.00    Pack years: 17.50    Types: Cigarettes  . Smokeless tobacco: Never Used  . Tobacco comment: trying to quit  Substance and Sexual Activity  . Alcohol use: Yes    Alcohol/week:  1.2 oz    Types: 2 Glasses of wine per week    Comment: occasionally  . Drug use: No  . Sexual activity: Not on file  Lifestyle  . Physical activity:    Days per week: Not on file    Minutes per session: Not on file  . Stress: Not on file  Relationships  . Social connections:    Talks on phone: Not on file    Gets together: Not on file    Attends religious service: Not on file    Active member of club or organization: Not on file    Attends meetings of clubs or organizations: Not on file    Relationship status: Not on file  . Intimate partner violence:    Fear of current or ex partner: Not on file    Emotionally abused: Not on file    Physically abused: Not on file    Forced sexual activity: Not on file  Other Topics Concern  . Not on file  Social History Narrative  . Not on file   Family History  Problem Relation Age of Onset  . Heart disease Mother   . Diabetes Mother   . Hypertension Mother   . Hyperlipidemia Mother   . COPD Mother   . COPD Father   . Diabetes Father   . Heart disease Father   . Cancer Father        throat  . Dementia Father   . Asthma Son   . Hypertension Sister   . Hyperlipidemia Sister   . Hypertension Brother   . Hyperlipidemia Brother   . Hypertension Brother   . Hyperlipidemia Brother     Objective: Office vital signs reviewed. There were no vitals taken for this visit.  Physical Examination:  General: Awake, alert, *** nourished, No acute distress HEENT: Normal    Neck: No masses palpated. No lymphadenopathy    Ears: Tympanic membranes intact, normal light reflex, no  erythema, no bulging    Eyes: PERRLA, extraocular membranes intact, sclera ***    Nose: nasal turbinates moist, *** nasal discharge    Throat: moist mucus membranes, no erythema, *** tonsillar exudate.  Airway is patent Cardio: regular rate and rhythm, S1S2 heard, no murmurs appreciated Pulm: clear to auscultation bilaterally, no wheezes, rhonchi or rales; normal work of breathing on room air GI: soft, non-tender, non-distended, bowel sounds present x4, no hepatomegaly, no splenomegaly, no masses GU: external vaginal tissue ***, cervix ***, *** punctate lesions on cervix appreciated, *** discharge from cervical os, *** bleeding, *** cervical motion tenderness, *** abdominal/ adnexal masses Extremities: warm, well perfused, No edema, cyanosis or clubbing; +*** pulses bilaterally MSK: *** gait and *** station Skin: dry; intact; no rashes or lesions Neuro: *** Strength and light touch sensation grossly intact, *** DTRs ***/4  Assessment/ Plan: 62 y.o. female   ***  No orders of the defined types were placed in this encounter.  No orders of the defined types were placed in this encounter.    Jasmine Norlander, DO [facility] [phone]

## 2017-06-30 ENCOUNTER — Encounter (HOSPITAL_COMMUNITY): Payer: Self-pay | Admitting: Emergency Medicine

## 2017-06-30 ENCOUNTER — Inpatient Hospital Stay (HOSPITAL_COMMUNITY)
Admission: EM | Admit: 2017-06-30 | Discharge: 2017-07-04 | DRG: 190 | Disposition: A | Discharge status: Home Health | Attending: Family Medicine | Admitting: Family Medicine

## 2017-06-30 ENCOUNTER — Emergency Department (HOSPITAL_COMMUNITY)

## 2017-06-30 ENCOUNTER — Other Ambulatory Visit: Payer: Self-pay

## 2017-06-30 ENCOUNTER — Encounter: Payer: Self-pay | Admitting: Family Medicine

## 2017-06-30 ENCOUNTER — Ambulatory Visit (INDEPENDENT_AMBULATORY_CARE_PROVIDER_SITE_OTHER): Admitting: Family Medicine

## 2017-06-30 VITALS — BP 101/62 | HR 89 | Temp 97.9°F | Resp 18 | Ht 63.0 in | Wt 103.0 lb

## 2017-06-30 DIAGNOSIS — Z90710 Acquired absence of both cervix and uterus: Secondary | ICD-10-CM

## 2017-06-30 DIAGNOSIS — Z818 Family history of other mental and behavioral disorders: Secondary | ICD-10-CM | POA: Diagnosis not present

## 2017-06-30 DIAGNOSIS — F17210 Nicotine dependence, cigarettes, uncomplicated: Secondary | ICD-10-CM | POA: Diagnosis present

## 2017-06-30 DIAGNOSIS — Z9689 Presence of other specified functional implants: Secondary | ICD-10-CM | POA: Diagnosis present

## 2017-06-30 DIAGNOSIS — E559 Vitamin D deficiency, unspecified: Secondary | ICD-10-CM | POA: Diagnosis present

## 2017-06-30 DIAGNOSIS — M48061 Spinal stenosis, lumbar region without neurogenic claudication: Secondary | ICD-10-CM | POA: Diagnosis present

## 2017-06-30 DIAGNOSIS — K219 Gastro-esophageal reflux disease without esophagitis: Secondary | ICD-10-CM | POA: Diagnosis present

## 2017-06-30 DIAGNOSIS — R0602 Shortness of breath: Secondary | ICD-10-CM

## 2017-06-30 DIAGNOSIS — Z888 Allergy status to other drugs, medicaments and biological substances status: Secondary | ICD-10-CM

## 2017-06-30 DIAGNOSIS — Z8349 Family history of other endocrine, nutritional and metabolic diseases: Secondary | ICD-10-CM | POA: Diagnosis not present

## 2017-06-30 DIAGNOSIS — J9601 Acute respiratory failure with hypoxia: Secondary | ICD-10-CM | POA: Diagnosis present

## 2017-06-30 DIAGNOSIS — Z8249 Family history of ischemic heart disease and other diseases of the circulatory system: Secondary | ICD-10-CM | POA: Diagnosis not present

## 2017-06-30 DIAGNOSIS — Z7951 Long term (current) use of inhaled steroids: Secondary | ICD-10-CM

## 2017-06-30 DIAGNOSIS — G629 Polyneuropathy, unspecified: Secondary | ICD-10-CM | POA: Diagnosis present

## 2017-06-30 DIAGNOSIS — D509 Iron deficiency anemia, unspecified: Secondary | ICD-10-CM | POA: Diagnosis present

## 2017-06-30 DIAGNOSIS — Z825 Family history of asthma and other chronic lower respiratory diseases: Secondary | ICD-10-CM | POA: Diagnosis not present

## 2017-06-30 DIAGNOSIS — R000 Tachycardia, unspecified: Secondary | ICD-10-CM | POA: Diagnosis present

## 2017-06-30 DIAGNOSIS — F1190 Opioid use, unspecified, uncomplicated: Secondary | ICD-10-CM | POA: Diagnosis not present

## 2017-06-30 DIAGNOSIS — Z9089 Acquired absence of other organs: Secondary | ICD-10-CM | POA: Diagnosis not present

## 2017-06-30 DIAGNOSIS — Z881 Allergy status to other antibiotic agents status: Secondary | ICD-10-CM

## 2017-06-30 DIAGNOSIS — I10 Essential (primary) hypertension: Secondary | ICD-10-CM | POA: Diagnosis present

## 2017-06-30 DIAGNOSIS — Z885 Allergy status to narcotic agent status: Secondary | ICD-10-CM

## 2017-06-30 DIAGNOSIS — J441 Chronic obstructive pulmonary disease with (acute) exacerbation: Secondary | ICD-10-CM

## 2017-06-30 DIAGNOSIS — G8929 Other chronic pain: Secondary | ICD-10-CM | POA: Diagnosis present

## 2017-06-30 DIAGNOSIS — Z808 Family history of malignant neoplasm of other organs or systems: Secondary | ICD-10-CM

## 2017-06-30 DIAGNOSIS — M549 Dorsalgia, unspecified: Secondary | ICD-10-CM | POA: Diagnosis present

## 2017-06-30 DIAGNOSIS — E876 Hypokalemia: Secondary | ICD-10-CM | POA: Diagnosis present

## 2017-06-30 DIAGNOSIS — Z681 Body mass index (BMI) 19 or less, adult: Secondary | ICD-10-CM | POA: Diagnosis not present

## 2017-06-30 DIAGNOSIS — J9621 Acute and chronic respiratory failure with hypoxia: Secondary | ICD-10-CM

## 2017-06-30 DIAGNOSIS — Z79891 Long term (current) use of opiate analgesic: Secondary | ICD-10-CM

## 2017-06-30 DIAGNOSIS — M5441 Lumbago with sciatica, right side: Secondary | ICD-10-CM | POA: Diagnosis not present

## 2017-06-30 DIAGNOSIS — Z79899 Other long term (current) drug therapy: Secondary | ICD-10-CM

## 2017-06-30 DIAGNOSIS — Z880 Allergy status to penicillin: Secondary | ICD-10-CM

## 2017-06-30 DIAGNOSIS — Z981 Arthrodesis status: Secondary | ICD-10-CM | POA: Diagnosis not present

## 2017-06-30 DIAGNOSIS — Z833 Family history of diabetes mellitus: Secondary | ICD-10-CM

## 2017-06-30 DIAGNOSIS — E440 Moderate protein-calorie malnutrition: Secondary | ICD-10-CM

## 2017-06-30 LAB — BLOOD GAS, ARTERIAL
Bicarbonate: 18.3 mmol/L — ABNORMAL LOW (ref 20.0–28.0)
DRAWN BY: 317771
O2 Content: 2 L/min
O2 Saturation: 89.9 %
PH ART: 7.321 — AB (ref 7.350–7.450)
PO2 ART: 67.4 mmHg — AB (ref 83.0–108.0)
pCO2 arterial: 34.6 mmHg (ref 32.0–48.0)

## 2017-06-30 LAB — CBC WITH DIFFERENTIAL/PLATELET
BASOS ABS: 0 10*3/uL (ref 0.0–0.1)
Basophils Relative: 0 %
Eosinophils Absolute: 0 10*3/uL (ref 0.0–0.7)
Eosinophils Relative: 0 %
HEMATOCRIT: 37.9 % (ref 36.0–46.0)
HEMOGLOBIN: 12 g/dL (ref 12.0–15.0)
LYMPHS PCT: 10 %
Lymphs Abs: 1.3 10*3/uL (ref 0.7–4.0)
MCH: 32.3 pg (ref 26.0–34.0)
MCHC: 31.7 g/dL (ref 30.0–36.0)
MCV: 102.2 fL — AB (ref 78.0–100.0)
Monocytes Absolute: 1.2 10*3/uL — ABNORMAL HIGH (ref 0.1–1.0)
Monocytes Relative: 10 %
NEUTROS ABS: 10.3 10*3/uL — AB (ref 1.7–7.7)
NEUTROS PCT: 80 %
PLATELETS: 229 10*3/uL (ref 150–400)
RBC: 3.71 MIL/uL — AB (ref 3.87–5.11)
RDW: 14.5 % (ref 11.5–15.5)
WBC: 13 10*3/uL — AB (ref 4.0–10.5)

## 2017-06-30 LAB — BASIC METABOLIC PANEL
ANION GAP: 20 — AB (ref 5–15)
BUN: 11 mg/dL (ref 6–20)
CHLORIDE: 100 mmol/L — AB (ref 101–111)
CO2: 16 mmol/L — ABNORMAL LOW (ref 22–32)
Calcium: 9.3 mg/dL (ref 8.9–10.3)
Creatinine, Ser: 0.76 mg/dL (ref 0.44–1.00)
GFR calc Af Amer: >60 mL/min (ref >60)
Glucose, Bld: 77 mg/dL (ref 65–99)
POTASSIUM: 2.4 mmol/L — AB (ref 3.5–5.1)
SODIUM: 136 mmol/L (ref 135–145)

## 2017-06-30 LAB — MAGNESIUM: MAGNESIUM: 1.7 mg/dL (ref 1.7–2.4)

## 2017-06-30 MED ORDER — METHYLPREDNISOLONE ACETATE 80 MG/ML IJ SUSP
80.0000 mg | Freq: Once | INTRAMUSCULAR | Status: AC
  Administered 2017-06-30: 80 mg via INTRAMUSCULAR

## 2017-06-30 MED ORDER — POTASSIUM CHLORIDE 10 MEQ/100ML IV SOLN
10.0000 meq | Freq: Once | INTRAVENOUS | Status: AC
  Administered 2017-06-30: 10 meq via INTRAVENOUS
  Filled 2017-06-30: qty 100

## 2017-06-30 MED ORDER — LEVOFLOXACIN 500 MG PO TABS: 500.0000 mg | ORAL_TABLET | Freq: Every day | ORAL | 0 refills | Status: AC

## 2017-06-30 MED ORDER — LEVOFLOXACIN IN D5W 500 MG/100ML IV SOLN
500.0000 mg | INTRAVENOUS | Status: DC
  Administered 2017-06-30 – 2017-07-01 (×2): 500 mg via INTRAVENOUS
  Filled 2017-06-30 (×2): qty 100

## 2017-06-30 MED ORDER — IOPAMIDOL (ISOVUE-300) INJECTION 61%: 75.0000 mL | Freq: Once | INTRAVENOUS | Status: DC | PRN

## 2017-06-30 MED ORDER — ALBUTEROL SULFATE (2.5 MG/3ML) 0.083% IN NEBU
10.0000 mg | INHALATION_SOLUTION | Freq: Once | RESPIRATORY_TRACT | Status: AC
  Administered 2017-06-30: 10 mg via RESPIRATORY_TRACT
  Filled 2017-06-30: qty 12

## 2017-06-30 MED ORDER — LORATADINE 10 MG PO TABS
10.0000 mg | ORAL_TABLET | Freq: Every day | ORAL | Status: DC
  Administered 2017-07-03: 10 mg via ORAL
  Filled 2017-06-30 (×4): qty 1

## 2017-06-30 MED ORDER — POTASSIUM CHLORIDE 10 MEQ/100ML IV SOLN
10.0000 meq | INTRAVENOUS | Status: AC
  Administered 2017-06-30 (×3): 10 meq via INTRAVENOUS
  Filled 2017-06-30 (×3): qty 100

## 2017-06-30 MED ORDER — PREDNISONE 20 MG PO TABS: 40.0000 mg | ORAL_TABLET | Freq: Every day | ORAL | 0 refills | Status: AC

## 2017-06-30 MED ORDER — LEVOFLOXACIN 500 MG PO TABS: 500.0000 mg | ORAL_TABLET | Freq: Every day | ORAL | 0 refills | Status: DC

## 2017-06-30 MED ORDER — SODIUM CHLORIDE 0.9 % IV BOLUS
1000.0000 mL | Freq: Once | INTRAVENOUS | Status: AC
  Administered 2017-06-30: 1000 mL via INTRAVENOUS

## 2017-06-30 MED ORDER — POTASSIUM CHLORIDE CRYS ER 20 MEQ PO TBCR
40.0000 meq | EXTENDED_RELEASE_TABLET | Freq: Three times a day (TID) | ORAL | Status: DC
  Administered 2017-06-30 – 2017-07-04 (×11): 40 meq via ORAL
  Filled 2017-06-30 (×11): qty 2

## 2017-06-30 MED ORDER — GUAIFENESIN ER 600 MG PO TB12
600.0000 mg | ORAL_TABLET | Freq: Two times a day (BID) | ORAL | Status: DC
  Administered 2017-06-30 – 2017-07-04 (×8): 600 mg via ORAL
  Filled 2017-06-30 (×8): qty 1

## 2017-06-30 MED ORDER — HYDROCODONE BITARTRATE ER 60 MG PO T24A
1.0000 | EXTENDED_RELEASE_TABLET | Freq: Every day | ORAL | Status: DC
Start: 2017-06-30 — End: 2017-06-30

## 2017-06-30 MED ORDER — METHYLPREDNISOLONE SODIUM SUCC 125 MG IJ SOLR
125.0000 mg | Freq: Once | INTRAMUSCULAR | Status: AC
  Administered 2017-06-30: 125 mg via INTRAVENOUS
  Filled 2017-06-30: qty 2

## 2017-06-30 MED ORDER — ALBUTEROL SULFATE (2.5 MG/3ML) 0.083% IN NEBU: 2.5000 mg | INHALATION_SOLUTION | RESPIRATORY_TRACT | Status: DC | PRN

## 2017-06-30 MED ORDER — TRAZODONE HCL 50 MG PO TABS
225.0000 mg | ORAL_TABLET | Freq: Every day | ORAL | Status: DC
  Administered 2017-06-30 – 2017-07-03 (×4): 300 mg via ORAL
  Filled 2017-06-30 (×4): qty 6

## 2017-06-30 MED ORDER — DICLOFENAC SODIUM 1 % TD GEL
2.0000 g | Freq: Every day | TRANSDERMAL | Status: DC | PRN
  Filled 2017-06-30: qty 100

## 2017-06-30 MED ORDER — ONDANSETRON HCL 4 MG PO TABS: 4.0000 mg | ORAL_TABLET | Freq: Four times a day (QID) | ORAL | Status: DC | PRN

## 2017-06-30 MED ORDER — ACETAMINOPHEN 325 MG PO TABS
650.0000 mg | ORAL_TABLET | Freq: Four times a day (QID) | ORAL | Status: DC | PRN
Start: 2017-06-30 — End: 2017-07-04

## 2017-06-30 MED ORDER — NICOTINE 14 MG/24HR TD PT24
14.0000 mg | MEDICATED_PATCH | Freq: Every day | TRANSDERMAL | Status: DC
  Administered 2017-06-30 – 2017-07-03 (×4): 14 mg via TRANSDERMAL
  Filled 2017-06-30 (×5): qty 1

## 2017-06-30 MED ORDER — ALBUTEROL SULFATE (2.5 MG/3ML) 0.083% IN NEBU
5.0000 mg | INHALATION_SOLUTION | Freq: Once | RESPIRATORY_TRACT | Status: AC
  Administered 2017-06-30: 5 mg via RESPIRATORY_TRACT
  Filled 2017-06-30: qty 6

## 2017-06-30 MED ORDER — CHLORHEXIDINE GLUCONATE 0.12 % MT SOLN
15.0000 mL | Freq: Two times a day (BID) | OROMUCOSAL | Status: DC
  Administered 2017-07-01 – 2017-07-04 (×5): 15 mL via OROMUCOSAL
  Filled 2017-06-30 (×5): qty 15

## 2017-06-30 MED ORDER — IOPAMIDOL (ISOVUE-370) INJECTION 76%
75.0000 mL | Freq: Once | INTRAVENOUS | Status: AC | PRN
  Administered 2017-06-30: 75 mL via INTRAVENOUS

## 2017-06-30 MED ORDER — METHYLPREDNISOLONE SODIUM SUCC 125 MG IJ SOLR
60.0000 mg | Freq: Four times a day (QID) | INTRAMUSCULAR | Status: DC
Start: 2017-06-30 — End: 2017-07-02
  Administered 2017-06-30 – 2017-07-02 (×7): 60 mg via INTRAVENOUS
  Filled 2017-06-30 (×7): qty 2

## 2017-06-30 MED ORDER — ENOXAPARIN SODIUM 40 MG/0.4ML ~~LOC~~ SOLN
40.0000 mg | SUBCUTANEOUS | Status: DC
  Administered 2017-06-30 – 2017-07-03 (×4): 40 mg via SUBCUTANEOUS
  Filled 2017-06-30 (×4): qty 0.4

## 2017-06-30 MED ORDER — TIZANIDINE HCL 2 MG PO TABS
6.0000 mg | ORAL_TABLET | Freq: Three times a day (TID) | ORAL | Status: DC
  Administered 2017-06-30 – 2017-07-04 (×11): 6 mg via ORAL
  Filled 2017-06-30 (×12): qty 3

## 2017-06-30 MED ORDER — IPRATROPIUM-ALBUTEROL 0.5-2.5 (3) MG/3ML IN SOLN
3.0000 mL | Freq: Four times a day (QID) | RESPIRATORY_TRACT | Status: DC
  Administered 2017-06-30 – 2017-07-04 (×16): 3 mL via RESPIRATORY_TRACT
  Filled 2017-06-30 (×15): qty 3

## 2017-06-30 MED ORDER — HYDROCODONE-ACETAMINOPHEN 7.5-325 MG PO TABS
1.0000 | ORAL_TABLET | Freq: Four times a day (QID) | ORAL | Status: DC
  Administered 2017-06-30 – 2017-07-04 (×15): 1 via ORAL
  Filled 2017-06-30 (×15): qty 1

## 2017-06-30 MED ORDER — ACETAMINOPHEN 650 MG RE SUPP: 650.0000 mg | Freq: Four times a day (QID) | RECTAL | Status: DC | PRN

## 2017-06-30 MED ORDER — ORAL CARE MOUTH RINSE
15.0000 mL | Freq: Two times a day (BID) | OROMUCOSAL | Status: DC
  Administered 2017-07-03 (×2): 15 mL via OROMUCOSAL

## 2017-06-30 MED ORDER — AMLODIPINE BESYLATE 5 MG PO TABS
10.0000 mg | ORAL_TABLET | Freq: Every day | ORAL | Status: DC
  Administered 2017-07-01 – 2017-07-04 (×4): 10 mg via ORAL
  Filled 2017-06-30 (×4): qty 2

## 2017-06-30 MED ORDER — PANTOPRAZOLE SODIUM 40 MG PO TBEC
40.0000 mg | DELAYED_RELEASE_TABLET | Freq: Every day | ORAL | Status: DC
  Administered 2017-07-01 – 2017-07-04 (×4): 40 mg via ORAL
  Filled 2017-06-30 (×4): qty 1

## 2017-06-30 MED ORDER — MOMETASONE FURO-FORMOTEROL FUM 200-5 MCG/ACT IN AERO
2.0000 | INHALATION_SPRAY | Freq: Two times a day (BID) | RESPIRATORY_TRACT | Status: DC
  Administered 2017-07-01 – 2017-07-04 (×7): 2 via RESPIRATORY_TRACT
  Filled 2017-06-30: qty 8.8

## 2017-06-30 MED ORDER — IPRATROPIUM-ALBUTEROL 0.5-2.5 (3) MG/3ML IN SOLN
3.0000 mL | Freq: Once | RESPIRATORY_TRACT | Status: AC
  Administered 2017-06-30: 3 mL via RESPIRATORY_TRACT

## 2017-06-30 MED ORDER — ONDANSETRON HCL 4 MG/2ML IJ SOLN: 4.0000 mg | Freq: Four times a day (QID) | INTRAMUSCULAR | Status: DC | PRN

## 2017-06-30 MED ORDER — DULOXETINE HCL 60 MG PO CPEP: 90.0000 mg | ORAL_CAPSULE | Freq: Every day | ORAL | Status: DC

## 2017-06-30 MED ORDER — PREDNISONE 20 MG PO TABS
40.0000 mg | ORAL_TABLET | Freq: Every day | ORAL | 0 refills | Status: DC
Start: 2017-07-01 — End: 2017-06-30

## 2017-06-30 MED ORDER — AZITHROMYCIN 500 MG IV SOLR: 500.0000 mg | INTRAVENOUS | Status: DC

## 2017-06-30 MED ORDER — DULOXETINE HCL 30 MG PO CPEP
30.0000 mg | ORAL_CAPSULE | Freq: Three times a day (TID) | ORAL | Status: DC
  Administered 2017-07-01 – 2017-07-04 (×10): 30 mg via ORAL
  Filled 2017-06-30 (×11): qty 1

## 2017-06-30 NOTE — Progress Notes (Signed)
Subjective: CC: COPD PCP: Timmothy Euler, MD WGY:[id number] Jasmine [name] is a 62 y.o. female presenting to clinic today for:  1. COPD Patient presents in office with complaints of shortness of breath, productive cough, fevers x5 days.  She notes that she called her PCP, who sent her in Ceftin ear and prednisone, which she has now completed.  She continues to have fevers and shortness of breath.  She has been using her albuterol inhaler, DuoNeb's, Spiriva and Symbicort at max doses with little improvement in symptoms.  She notes that she measured her oxygen at home and this was dropping down into the 60s and 70s.  Denies hemoptysis.  No lower extremity edema.  No chest pain.  Past medical history significant for COPD.   ROS: Per HPI  Allergies  Allergen Reactions  . Lyrica [Pregabalin] Other (See Comments)    EXTREME SHAKING/TREMBLING  . Darvon [Propoxyphene]     UNSPECIFIED REACTION   . Gabapentin     Extreme shaking and trembling   . Augmentin [Amoxicillin-Pot Clavulanate] Nausea And Vomiting    Has patient had a PCN reaction causing immediate rash, facial/tongue/throat swelling, SOB or lightheadedness with hypotension:no Has patient had a PCN reaction causing severe rash involving mucus membranes or skin necrosis:No Has patient had a PCN reaction that required hospitalization:No Has patient had a PCN reaction occurring within the last 10 years:Yes--ONLY N/V If all of the above answers are "NO", then may proceed with Cephalosporin use.   . Erythromycin Itching and Rash       . Vibramycin [Doxycycline Calcium] Itching and Rash    "[place] "   Past Medical History:  Diagnosis Date  . Allergy   . Anemia   . Anxiety   . Arthritis   . Asthma   . Carpal tunnel syndrome    bilateral  . Complication of anesthesia    in the past has had N/V, the last few surgeries she has not been  . COPD (chronic obstructive pulmonary disease) ([place])   . Easy bruising 07/10/2016  . GERD  (gastroesophageal reflux disease)   . Headache   . Heart murmur 2005   never had any problems  . History of blood transfusion 2018  . History of kidney stones   . Hypertension   . Hypoglycemia   . Hypoglycemia   . Iron deficiency anemia 07/11/2016  . Neuromuscular disorder ([place])    pt denies  . Neuropathy    both arms  . Normocytic anemia 07/29/2015  . Pneumonia   . PONV (postoperative nausea and vomiting)   . Sciatica   . Seizures ([place])    as a child when she would have an asthma attack. none as an adult  . Shortness of breath dyspnea     Current Outpatient Medications:  .  albuterol (PROVENTIL HFA;VENTOLIN HFA) 108 (90 Base) MCG/ACT inhaler, Inhale 2 puffs into the lungs every 6 (six) hours as needed for wheezing or shortness of breath., Disp: 3 Inhaler, Rfl: 3 .  amLODipine (NORVASC) 10 MG tablet, TAKE 1 TABLET DAILY, Disp: 90 tablet, Rfl: 1 .  Aspirin-Salicylamide-Caffeine (BC HEADACHE POWDER PO), Take 1 packet by mouth daily as needed (headaches). , Disp: , Rfl:  .  budesonide-formoterol (SYMBICORT) 160-4.5 MCG/ACT inhaler, USE 2 INHALATIONS TWICE A DAY, Disp: 10.2 g, Rfl: 0 .  calcium carbonate (OSCAL) 1500 (600 Ca) MG TABS tablet, Take 600 mg by mouth daily. , Disp: , Rfl:  .  cetirizine (ZYRTEC) 10 MG tablet, Take 10  mg by mouth daily as needed for allergies. Reported on 03/12/2015, Disp: , Rfl:  .  Cholecalciferol (D-3-5) 5000 units capsule, Take 5,000 Units daily by mouth., Disp: , Rfl:  .  diclofenac (VOLTAREN) 75 MG EC tablet, Take 1 tablet (75 mg total) by mouth 2 (two) times daily., Disp: 30 tablet, Rfl: 0 .  diclofenac sodium (VOLTAREN) 1 % GEL, Apply 1 application topically daily as needed for muscle pain., Disp: , Rfl:  .  DULoxetine (CYMBALTA) 30 MG capsule, Take 3 capsules (90 mg total) by mouth daily., Disp: 270 capsule, Rfl: 0 .  guaiFENesin (MUCINEX) 600 MG 12 hr tablet, Take 600 mg by mouth 2 (two) times daily as needed (congestion). , Disp: , Rfl:  .  hydrOXYzine  (VISTARIL) 50 MG capsule, Take 1 capsule (50 mg total) 3 (three) times daily as needed by mouth for anxiety., Disp: 60 capsule, Rfl: 3 .  ipratropium-albuterol (DUONEB) 0.5-2.5 (3) MG/3ML SOLN, Inhale 3 mLs into the lungs 4 (four) times daily as needed (for shortness of breath/wheezing). *May use as needed, Disp: , Rfl:  .  Iron Polysacch Cmplx-B12-FA 150-0.025-1 MG CAPS, Take 1 capsule daily by mouth., Disp: 90 each, Rfl: 1 .  Omega-3 Fatty Acids (FISH OIL) 1000 MG CAPS, Take 1 capsule by mouth., Disp: , Rfl:  .  pantoprazole (PROTONIX) 40 MG tablet, TAKE 1 TABLET DAILY, Disp: 90 tablet, Rfl: 3 .  pantoprazole (PROTONIX) 40 MG tablet, TAKE 1 TABLET DAILY, Disp: 90 tablet, Rfl: 1 .  PROAIR HFA 108 (90 Base) MCG/ACT inhaler, USE 2 INHALATIONS EVERY 6 HOURS AS NEEDED FOR WHEEZING OR SHORTNESS OF BREATH, Disp: 25.5 g, Rfl: 1 .  SPIRIVA HANDIHALER 18 MCG inhalation capsule, INHALE THE CONTENTS OF 1 CAPSULE DAILY, Disp: 90 capsule, Rfl: 1 .  tizanidine (ZANAFLEX) 6 MG capsule, Take 1 capsule 3 (three) times daily by mouth., Disp: , Rfl:  .  traMADol (ULTRAM) 50 MG tablet, Take 50 mg every 6 (six) hours as needed by mouth for moderate pain. , Disp: , Rfl:  .  traZODone (DESYREL) 150 MG tablet, Take 1.5-2 tablets (225-300 mg total) at bedtime by mouth., Disp: 180 tablet, Rfl: 3 .  nicotine (NICODERM CQ - DOSED IN MG/24 HOURS) 14 mg/24hr patch, Place 1 patch (14 mg total) daily onto the skin. (Patient not taking: Reported on 06/30/2017), Disp: 14 patch, Rfl: 0 .  traMADol (ULTRAM-ER) 300 MG 24 hr tablet, Take 300 mg daily by mouth., Disp: , Rfl:  Social History   Socioeconomic History  . Marital status: Married    Spouse name: Not on file  . Number of children: Not on file  . Years of education: Not on file  . Highest education level: Not on file  Occupational History  . Not on file  Social Needs  . Financial resource strain: Not on file  . Food insecurity:    Worry: Not on file    Inability: Not  on file  . Transportation needs:    Medical: Not on file    Non-medical: Not on file  Tobacco Use  . Smoking status: Current Every Day Smoker    Packs/day: 0.50    Years: 35.00    Pack years: 17.50    Types: Cigarettes  . Smokeless tobacco: Never Used  . Tobacco comment: trying to quit  Substance and Sexual Activity  . Alcohol use: Yes    Alcohol/week: 1.2 oz    Types: 2 Glasses of wine per week    Comment:  occasionally  . Drug use: No  . Sexual activity: Not on file  Lifestyle  . Physical activity:    Days per week: Not on file    Minutes per session: Not on file  . Stress: Not on file  Relationships  . Social connections:    Talks on phone: Not on file    Gets together: Not on file    Attends religious service: Not on file    Active member of club or organization: Not on file    Attends meetings of clubs or organizations: Not on file    Relationship status: Not on file  . Intimate partner violence:    Fear of current or ex partner: Not on file    Emotionally abused: Not on file    Physically abused: Not on file    Forced sexual activity: Not on file  Other Topics Concern  . Not on file  Social History Narrative  . Not on file   Family History  Problem Relation Age of Onset  . Heart disease Mother   . Diabetes Mother   . Hypertension Mother   . Hyperlipidemia Mother   . COPD Mother   . COPD Father   . Diabetes Father   . Heart disease Father   . Cancer Father        throat  . Dementia Father   . Asthma Son   . Hypertension Sister   . Hyperlipidemia Sister   . Hypertension Brother   . Hyperlipidemia Brother   . Hypertension Brother   . Hyperlipidemia Brother     Objective: Office vital signs reviewed. BP 101/62   Pulse 89   Temp 97.9 F (36.6 Jasmine) (Oral)   Resp 18   Ht 5\' 3"  (1.6 m)   Wt 103 lb (46.7 kg)   SpO2 (!) 83%   BMI 18.25 kg/m   Physical Examination:  General: Awake, alert, chronically ill appearing female, No acute  distress Cardio: regular rate and rhythm, S1S2 heard, no murmurs appreciated Pulm: Globally decreased breath sounds.  No appreciable wheezes, rhonchi or rales; normal work of breathing on room air but oxygen saturation is noted to be 83%.  Assessment/ Plan: 62 y.o. female   1. COPD exacerbation ([place]) Patient is afebrile and nontoxic-appearing but she does appear ill.  Her pulmonary exam is remarkable for globally decreased breath sounds.  Air movement did improve after DuoNeb provided here in office.  There were global expiratory wheezes noted.  Her oxygen saturation did increase to 88% on room air after DuoNeb.  She was given a dose of Depo-Medrol 80 IM.  We did discuss that her oxygen saturation on room air is borderline and that she should consider evaluation in the emergency department.  Patient did not desire to go to the emergency department if possible.  She wants to try outpatient treatment and agrees to go to the ED if there is no improvement in symptoms or worsening symptoms.  We discussed the risks and benefits of this.  She excepted these risks.  Levaquin 500 mg p.o. daily for 7 days prescribed.  Prednisone burst prescribed.  Home care instructions were reviewed with regards to inhalation treatments.  Reasons for emergent evaluation the emergency department discussed.  Patient and her husband voiced good understanding and will follow-up as directed. - methylPREDNISolone acetate (DEPO-MEDROL) injection 80 mg  2. SOB (shortness of breath) - ipratropium-albuterol (DUONEB) 0.5-2.5 (3) MG/3ML nebulizer solution 3 mL - DME Other see comment - methylPREDNISolone acetate (  DEPO-MEDROL) injection 80 mg   Orders Placed This Encounter  Procedures  . DME Other see comment    Nebulizer mask  DX: J44.9   Meds ordered this encounter  Medications  . ipratropium-albuterol (DUONEB) 0.5-2.5 (3) MG/3ML nebulizer solution 3 mL  . methylPREDNISolone acetate (DEPO-MEDROL) injection 80 mg  . DISCONTD:  levofloxacin (LEVAQUIN) 500 MG tablet    Sig: Take 1 tablet (500 mg total) by mouth daily for 7 days.    Dispense:  7 tablet    Refill:  0  . DISCONTD: predniSONE (DELTASONE) 20 MG tablet    Sig: Take 2 tablets (40 mg total) by mouth daily with breakfast for 5 days.    Dispense:  10 tablet    Refill:  0  . levofloxacin (LEVAQUIN) 500 MG tablet    Sig: Take 1 tablet (500 mg total) by mouth daily for 7 days.    Dispense:  7 tablet    Refill:  0  . predniSONE (DELTASONE) 20 MG tablet    Sig: Take 2 tablets (40 mg total) by mouth daily with breakfast for 5 days.    Dispense:  10 tablet    Refill:  0     Jasmine Strohman Windell Moulding, DO [place] [phone]

## 2017-06-30 NOTE — ED Notes (Addendum)
Pt continues to have difficulty breathing. Pt's O2 sat 88-89% on 4L O2 via [place], resp 26-30, HR 125bpm. Decreased breath sounds bilaterally. EDP notified. Order given for bipap. Resp notified.

## 2017-06-30 NOTE — ED Provider Notes (Addendum)
[facility] EMERGENCY DEPARTMENT Provider Note   CSN: [id number] Arrival date & time: 06/30/17  1441     History   Chief Complaint Chief Complaint  Patient presents with  . Shortness of Breath    HPI Jasmine [name] is a 62 y.o. female.  HPI  62 y.o female ho copd, smoker, presents with increased dyspnea 2 weeks.  Episode began with fever to 102 and dyspnea, treated with prednisone and antibiotics(ceftnire, prednisone 5 days) fever decreased but increasing dyspnea, clear thick sputum.  Some upper anterior chest pain for one week constantly.  No leg swelling.  Taking liquid po, but poor appetite and decreased solids, no vomiting, but two loose stools daily.  Patient had flu shot and pneumo utd. Patient states previous hospitalizations, but not intubated.  Last copd exacerbation about 1 year ago. Past Medical History:  Diagnosis Date  . Allergy   . Anemia   . Anxiety   . Arthritis   . Asthma   . Carpal tunnel syndrome    bilateral  . Complication of anesthesia    in the past has had N/V, the last few surgeries she has not been  . COPD (chronic obstructive pulmonary disease) ([place])   . Easy bruising 07/10/2016  . GERD (gastroesophageal reflux disease)   . Headache   . Heart murmur 2005   never had any problems  . History of blood transfusion 2018  . History of kidney stones   . Hypertension   . Hypoglycemia   . Hypoglycemia   . Iron deficiency anemia 07/11/2016  . Neuromuscular disorder ([place])    pt denies  . Neuropathy    both arms  . Normocytic anemia 07/29/2015  . Pneumonia   . PONV (postoperative nausea and vomiting)   . Sciatica   . Seizures ([place])    as a child when she would have an asthma attack. none as an adult  . Shortness of breath dyspnea     Patient Active Problem List   Diagnosis Date Noted  . 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/2016  . Tobacco abuse 08/04/2016  . Iron deficiency anemia 07/11/2016  . Easy bruising 07/10/2016  . Postoperative  anemia due to acute blood loss 11/15/2015  . Lumbar stenosis 11/11/2015  . Leg pain 09/24/2015  . Healthcare maintenance 09/02/2015  . Vitamin D deficiency 07/29/2015  . Chronic back pain 02/22/2015  . HTN (hypertension) 02/22/2015  . Chronic narcotic use 02/22/2015  . COPD (chronic obstructive pulmonary disease) ([place]) 02/22/2015  . Headache 02/22/2015    Past Surgical History:  Procedure Laterality Date  . ABDOMINAL HYSTERECTOMY  2005  . APPLICATION OF ROBOTIC ASSISTANCE FOR SPINAL PROCEDURE N/A 09/05/2016   Procedure: APPLICATION OF ROBOTIC ASSISTANCE FOR SPINAL PROCEDURE;  Surgeon: Consuella Lose, MD;  Location: [facility];  Service: Neurosurgery;  Laterality: N/A;  . CERVICAL FUSION    . [place]  . COLONOSCOPY    . cyst removal face    . disk repair     neck and lumbar region  . FOOT SURGERY Bilateral    to file bones down   . HARDWARE REMOVAL N/A 09/05/2016   Procedure: HARDWARE REMOVAL AND REPLACEMENT OF LUMBAR FIVE SCREWS;  Surgeon: Consuella Lose, MD;  Location: [facility];  Service: Neurosurgery;  Laterality: N/A;  . IMPLANTATION / PLACEMENT EPIDURAL NEUROSTIMULATOR ELECTRODES    . LAMINECTOMY     2006  . LUMBAR FUSION    . SPINAL CORD STIMULATOR BATTERY EXCHANGE    .  SPINAL CORD STIMULATOR INSERTION N/A 07/05/2016   Procedure: REPLACEMENT OF LUMBAR SPINAL CORD STIMULATOR BATTERY;  Surgeon: Newman Pies, MD;  Location: [place];  Service: Neurosurgery;  Laterality: N/A;  . TONSILLECTOMY       OB History   None      Home Medications    Prior to Admission medications   Medication Sig Start Date End Date Taking? Authorizing Provider  albuterol (PROVENTIL HFA;VENTOLIN HFA) 108 (90 Base) MCG/ACT inhaler Inhale 2 puffs into the lungs every 6 (six) hours as needed for wheezing or shortness of breath. 07/19/16   Timmothy Euler, MD  amLODipine (NORVASC) 10 MG tablet TAKE 1 TABLET DAILY 04/26/17   Timmothy Euler, MD  Aspirin-Salicylamide-Caffeine [facility]  HEADACHE POWDER PO) Take 1 packet by mouth daily as needed (headaches).     [provider]  budesonide-formoterol (SYMBICORT) 160-4.5 MCG/ACT inhaler USE 2 INHALATIONS TWICE A DAY 01/29/17   Timmothy Euler, MD  calcium carbonate (OSCAL) 1500 (600 Ca) MG TABS tablet Take 600 mg by mouth daily.     [provider]  cetirizine (ZYRTEC) 10 MG tablet Take 10 mg by mouth daily as needed for allergies. Reported on 03/12/2015    [provider]  Cholecalciferol (D-3-5) 5000 units capsule Take 5,000 Units daily by mouth.    [provider]  diclofenac (VOLTAREN) 75 MG EC tablet Take 1 tablet (75 mg total) by mouth 2 (two) times daily. 04/05/17   Timmothy Euler, MD  diclofenac sodium (VOLTAREN) 1 % GEL Apply 1 application topically daily as needed for muscle pain. 06/30/16   [provider]  DULoxetine (CYMBALTA) 30 MG capsule Take 3 capsules (90 mg total) by mouth daily. 11/16/16   Timmothy Euler, MD  guaiFENesin (MUCINEX) 600 MG 12 hr tablet Take 600 mg by mouth 2 (two) times daily as needed (congestion).     [provider]  hydrOXYzine (VISTARIL) 50 MG capsule Take 1 capsule (50 mg total) 3 (three) times daily as needed by mouth for anxiety. 01/16/17   Timmothy Euler, MD  ipratropium-albuterol (DUONEB) 0.5-2.5 (3) MG/3ML SOLN Inhale 3 mLs into the lungs 4 (four) times daily as needed (for shortness of breath/wheezing). *May use as needed 02/21/16   [provider]  Iron Polysacch Cmplx-B12-FA 150-0.025-1 MG CAPS Take 1 capsule daily by mouth. 01/19/17   Twana First, MD  levofloxacin (LEVAQUIN) 500 MG tablet Take 1 tablet (500 mg total) by mouth daily for 7 days. 06/30/17 07/07/17  Janora Norlander, DO  nicotine (NICODERM CQ - DOSED IN MG/24 HOURS) 14 mg/24hr patch Place 1 patch (14 mg total) daily onto the skin. Patient not taking: Reported on 06/30/2017 01/16/17   Timmothy Euler, MD  Omega-3 Fatty Acids (FISH OIL) 1000 MG CAPS  Take 1 capsule by mouth.    [provider]  pantoprazole (PROTONIX) 40 MG tablet TAKE 1 TABLET DAILY 08/17/16   Timmothy Euler, MD  pantoprazole (PROTONIX) 40 MG tablet TAKE 1 TABLET DAILY 05/14/17   Timmothy Euler, MD  predniSONE (DELTASONE) 20 MG tablet Take 2 tablets (40 mg total) by mouth daily with breakfast for 5 days. 07/01/17 07/06/17  Janora Norlander, DO  PROAIR HFA 108 (90 Base) MCG/ACT inhaler USE 2 INHALATIONS EVERY 6 HOURS AS NEEDED FOR WHEEZING OR SHORTNESS OF BREATH 06/25/17   Timmothy Euler, MD  SPIRIVA HANDIHALER 18 MCG inhalation capsule INHALE THE CONTENTS OF 1 CAPSULE DAILY 04/26/17   Timmothy Euler,  MD  tizanidine (ZANAFLEX) 6 MG capsule Take 1 capsule 3 (three) times daily by mouth. 11/27/16   [provider]  traMADol (ULTRAM) 50 MG tablet Take 50 mg every 6 (six) hours as needed by mouth for moderate pain.     [provider]  traMADol (ULTRAM-ER) 300 MG 24 hr tablet Take 300 mg daily by mouth.    [provider]  traZODone (DESYREL) 150 MG tablet Take 1.5-2 tablets (225-300 mg total) at bedtime by mouth. 01/16/17   Timmothy Euler, MD    Family History Family History  Problem Relation Age of Onset  . Heart disease Mother   . Diabetes Mother   . Hypertension Mother   . Hyperlipidemia Mother   . COPD Mother   . COPD Father   . Diabetes Father   . Heart disease Father   . Cancer Father        throat  . Dementia Father   . Asthma Son   . Hypertension Sister   . Hyperlipidemia Sister   . Hypertension Brother   . Hyperlipidemia Brother   . Hypertension Brother   . Hyperlipidemia Brother     Social History Social History   Tobacco Use  . Smoking status: Current Every Day Smoker    Packs/day: 0.50    Years: 35.00    Pack years: 17.50    Types: Cigarettes  . Smokeless tobacco: Never Used  . Tobacco comment: trying to quit  Substance Use Topics  . Alcohol use: Yes    Alcohol/week: 1.2 oz    Types: 2  Glasses of wine per week    Comment: occasionally  . Drug use: No     Allergies   Lyrica [pregabalin]; Darvon [propoxyphene]; Gabapentin; Augmentin [amoxicillin-pot clavulanate]; Erythromycin; and Vibramycin [doxycycline calcium]   Review of Systems Review of Systems  Constitutional: Positive for activity change, appetite change and fatigue.  HENT: Positive for congestion.   Eyes: Negative.   Respiratory: Positive for cough, chest tightness and shortness of breath.   Cardiovascular: Positive for chest pain. Negative for palpitations and leg swelling.  Gastrointestinal: Positive for diarrhea.  Endocrine: Negative.   Genitourinary: Negative.   Musculoskeletal: Negative.   Skin: Negative.   Neurological: Negative.   Hematological: Negative.   Psychiatric/Behavioral: Negative.   All other systems reviewed and are negative.    Physical Exam Updated Vital Signs BP 112/74   Pulse (!) 114   Temp 98.4 F (36.9 C) (Oral)   Resp (!) 29   Ht 1.6 m (5\' 3" )   Wt 46.7 kg (103 lb)   SpO2 92%   BMI 18.25 kg/m   Physical Exam  Constitutional: She is oriented to person, place, and time. She appears well-developed and well-nourished. She appears ill.  HENT:  Head: Normocephalic and atraumatic.  Mm dry  Neck: Normal range of motion.  Cardiovascular: Tachycardia present.  Pulmonary/Chest: She is in respiratory distress.  Decreased bs throughout with diffuse expiratory wheezes  Abdominal: Soft. Bowel sounds are normal.  Musculoskeletal: Normal range of motion.  Neurological: She is alert and oriented to person, place, and time.  Skin: Skin is warm and dry. Capillary refill takes less than 2 seconds.  Psychiatric: She has a normal mood and affect.  Nursing note and vitals reviewed.    ED Treatments / Results  Labs (all labs ordered are listed, but only abnormal results are displayed) Labs Reviewed  CBC WITH DIFFERENTIAL/PLATELET - Abnormal; Notable for the following  components:  Result Value   WBC 13.0 (*)    RBC 3.71 (*)    MCV 102.2 (*)    Neutro Abs 10.3 (*)    Monocytes Absolute 1.2 (*)    All other components within normal limits  BASIC METABOLIC PANEL    EKG EKG Interpretation  Date/Time:  Saturday June 30 2017 14:57:42 EDT Ventricular Rate:  102 PR Interval:    QRS Duration: 84 QT Interval:  352 QTC Calculation: 459 R Axis:   72 Text Interpretation:  Sinus tachycardia Prolonged PR interval Consider left atrial enlargement Left ventricular hypertrophy Anterior Q waves, possibly due to LVH Baseline wander in lead(s) V5 anterior q waves increased from first prior ekg of 9 setpember 2017 Confirmed by Pattricia Boss [phone]) on 06/30/2017 4:13:22 PM   Radiology No results found.  Procedures Procedures (including critical care time)  Medications Ordered in ED Medications  albuterol (PROVENTIL) (2.5 MG/3ML) 0.083% nebulizer solution 5 mg (5 mg Nebulization Given 06/30/17 1503)     Initial Impression / Assessment and Plan / ED Course  I have reviewed the triage vital signs and the nursing notes.  Pertinent labs & imaging results that were available during my care of the patient were reviewed by me and considered in my medical decision making (see chart for details).    6:12 PM Patient continues dyspneic but somewhat improved on BiPAP.  She continues tachycardic to 120 with diffusely decreased breath sounds with some expiratory wheezes Significant hypokalemia noted.  IV potassium runs started 6:39 PM CT results reviewed without evidence of PE, infiltrate, or pneumothorax.  Patient appears to have somewhat decreased dyspnea on BiPAP.  She continues to have expiratory wheezes.  Albuterol 10 mg continuous neb is ordered. Patient with significant dyspnea likely due to COPD exacerbation-PE ruled out, no CHF or lung consolidation or other apparent acute lung process.  She has received here in addition to reported outpatient IM injection  of steroids.  She was started on Levaquin today and took an oral dose prior to evaluation here.  He has required multiple albuterol nebulizers and is now being started on continuous nebulizer.  She has required BiPAP for respiratory distress with increased work of breathing. With significant hypokalemia which is being repleted Vitals:   06/30/17 1724 06/30/17 1730  BP: (!) 147/95 (!) 155/96  Pulse: (!) 118 (!) 118  Resp: (!) 25 (!) 26  Temp:    SpO2:  100%   Discussed with Dr. Nehemiah Settle and he will see for admission  CRITICAL CARE Performed by: Pattricia Boss Total critical care time: 60 minutes Critical care time was exclusive of separately billable procedures and treating other patients. Critical care was necessary to treat or prevent imminent or life-threatening deterioration. Critical care was time spent personally by me on the following activities: development of treatment plan with patient and/or surrogate as well as nursing, discussions with consultants, evaluation of patient's response to treatment, examination of patient, obtaining history from patient or surrogate, ordering and performing treatments and interventions, ordering and review of laboratory studies, ordering and review of radiographic studies, pulse oximetry and re-evaluation of patient's condition.  Final Clinical Impressions(s) / ED Diagnoses   Final diagnoses:  COPD exacerbation ([place])  Acute on chronic respiratory failure with hypoxia [facility])    ED Discharge Orders    None       Pattricia Boss, MD 06/30/17 1842    Pattricia Boss, MD 06/30/17 Daneil Dolin    Pattricia Boss, MD 06/30/17 1901

## 2017-06-30 NOTE — ED Notes (Addendum)
Date and time results received: 06/30/17 4:04 PM  (use smartphrase ".now" to insert current time)  Test: K+ Critical Value: 2.4  Name of Provider Notified: Ray  Orders Received? Or Actions Taken?: Orders Received - See Orders for details

## 2017-06-30 NOTE — Patient Instructions (Signed)
As we discussed, I have sent you in an antibiotic to take once daily for the next 7 days.  Take this with food.  Avoid dairy products while on this medication.  Start your prednisone tomorrow since he got the injection today.  Continue the inhalation treatments as you have been doing.  If symptoms get worse over the weekend, please seek immediate medical attention in the emergency department.  If you are not noticing a great deal of improvement by Monday I would like to be re-seen in office so that we can obtain a chest x-ray.  Chronic Obstructive Pulmonary Disease Exacerbation Chronic obstructive pulmonary disease (COPD) is a common lung problem. In COPD, the flow of air from the lungs is limited. COPD exacerbations are times that breathing gets worse and you need extra treatment. Without treatment they can be life threatening. If they happen often, your lungs can become more damaged. If your COPD gets worse, your doctor may treat you with:  Medicines.  Oxygen.  Different ways to clear your airway, such as using a mask.  Follow these instructions at home:  Do not smoke.  Avoid tobacco smoke and other things that bother your lungs.  If given, take your antibiotic medicine as told. Finish the medicine even if you start to feel better.  Only take medicines as told by your doctor.  Drink enough fluids to keep your pee (urine) clear or pale yellow (unless your doctor has told you not to).  Use a cool mist machine (vaporizer).  If you use oxygen or a machine that turns liquid medicine into a mist (nebulizer), continue to use them as told.  Keep up with shots (vaccinations) as told by your doctor.  Exercise regularly.  Eat healthy foods.  Keep all doctor visits as told. Get help right away if:  You are very short of breath and it gets worse.  You have trouble talking.  You have bad chest pain.  You have blood in your spit (sputum).  You have a fever.  You keep throwing up  (vomiting).  You feel weak, or you pass out (faint).  You feel confused.  You keep getting worse. This information is not intended to replace advice given to you by your health care provider. Make sure you discuss any questions you have with your health care provider. Document Released: 02/09/2011 Document Revised: 07/29/2015 Document Reviewed: 10/25/2012 Elsevier Interactive Patient Education  2017 [employer].

## 2017-06-30 NOTE — ED Notes (Signed)
Pure wick placed.

## 2017-06-30 NOTE — ED Notes (Signed)
ED Provider at bedside. 

## 2017-06-30 NOTE — H&P (Signed)
History and Physical  Jasmine [name] XLK:[id number] DOB: [birth year] DOA: 06/30/2017  Referring physician: Dr Jeanell Sparrow, ED physician PCP: Timmothy Euler, MD  Outpatient Specialists:  Patient Coming From: home  Chief Complaint: SOB  HPI: Jasmine [name] is a 62 y.o. female with a history of COPD, GERD, neuropathy, chronic pain on chronic narcotics, hypertension.  Patient presents with worsening shortness of breath over the past 3 to 4 weeks.  Patient contacted her PCP at the beginning, who prescribed Ceftin ear.  She completed this course and felt okay for a few days, and then began to become worse again.  Patient saw her PCP today and was noted to have hypoxia there.  Patient had a breathing treatment, IM steroid injection, and given a prescription for Levaquin with instructions to follow-up here if her respiratory status diminished further.  Patient went home and had oxygen saturations in the 70s.  She presented here for evaluation.  Her shortness of breath is worse with exertion and improved with rest.  She does have a cough that is productive with white sputum.  She denies fevers, chills, nausea, vomiting.  Emergency Department Course: Patient given multiple nebulizer treatments.  Had a Solu-Medrol 125 mg IV.  Was placed on BiPAP.  Starting to improve, but continues to be dyspneic with conversation.  Blood work fairly normal.  CT chest negative for PE or infiltrate.  Review of Systems:   Pt denies any fevers, chills, nausea, vomiting, diarrhea, constipation, abdominal pain, shortness of breath, dyspnea on exertion, orthopnea, cough, wheezing, palpitations, headache, vision changes, lightheadedness, dizziness, melena, rectal bleeding.  Review of systems are otherwise negative  Past Medical History:  Diagnosis Date  . Allergy   . Anemia   . Anxiety   . Arthritis   . Asthma   . Carpal tunnel syndrome    bilateral  . Complication of anesthesia    in the past has had N/V, the last few  surgeries she has not been  . COPD (chronic obstructive pulmonary disease) ([place])   . Easy bruising 07/10/2016  . GERD (gastroesophageal reflux disease)   . Headache   . Heart murmur 2005   never had any problems  . History of blood transfusion 2018  . History of kidney stones   . Hypertension   . Hypoglycemia   . Hypoglycemia   . Iron deficiency anemia 07/11/2016  . Neuromuscular disorder ([place])    pt denies  . Neuropathy    both arms  . Normocytic anemia 07/29/2015  . Pneumonia   . PONV (postoperative nausea and vomiting)   . Sciatica   . Seizures ([place])    as a child when she would have an asthma attack. none as an adult  . Shortness of breath dyspnea    Past Surgical History:  Procedure Laterality Date  . ABDOMINAL HYSTERECTOMY  2005  . APPLICATION OF ROBOTIC ASSISTANCE FOR SPINAL PROCEDURE N/A 09/05/2016   Procedure: APPLICATION OF ROBOTIC ASSISTANCE FOR SPINAL PROCEDURE;  Surgeon: Consuella Lose, MD;  Location: [facility];  Service: Neurosurgery;  Laterality: N/A;  . CERVICAL FUSION    . [place]  . COLONOSCOPY    . cyst removal face    . disk repair     neck and lumbar region  . FOOT SURGERY Bilateral    to file bones down   . HARDWARE REMOVAL N/A 09/05/2016   Procedure: HARDWARE REMOVAL AND REPLACEMENT OF LUMBAR FIVE SCREWS;  Surgeon: Consuella Lose, MD;  Location: [facility];  Service: Neurosurgery;  Laterality: N/A;  . IMPLANTATION / PLACEMENT EPIDURAL NEUROSTIMULATOR ELECTRODES    . LAMINECTOMY     2006  . LUMBAR FUSION    . SPINAL CORD STIMULATOR BATTERY EXCHANGE    . SPINAL CORD STIMULATOR INSERTION N/A 07/05/2016   Procedure: REPLACEMENT OF LUMBAR SPINAL CORD STIMULATOR BATTERY;  Surgeon: Newman Pies, MD;  Location: [facility];  Service: Neurosurgery;  Laterality: N/A;  . TONSILLECTOMY     Social History:  reports that she has been smoking cigarettes.  She has a 17.50 pack-year smoking history. She has never used smokeless tobacco. She reports that she  drinks about 1.2 oz of alcohol per week. She reports that she does not use drugs. Patient lives at home  Allergies  Allergen Reactions  . Lyrica [Pregabalin] Other (See Comments)    EXTREME SHAKING/TREMBLING  . Darvon [Propoxyphene]     UNSPECIFIED REACTION   . Gabapentin     Extreme shaking and trembling   . Augmentin [Amoxicillin-Pot Clavulanate] Nausea And Vomiting    Has patient had a PCN reaction causing immediate rash, facial/tongue/throat swelling, SOB or lightheadedness with hypotension:no Has patient had a PCN reaction causing severe rash involving mucus membranes or skin necrosis:No Has patient had a PCN reaction that required hospitalization:No Has patient had a PCN reaction occurring within the last 10 years:Yes--ONLY N/V If all of the above answers are "NO", then may proceed with Cephalosporin use.   . Erythromycin Itching and Rash       . Vibramycin [Doxycycline Calcium] Itching and Rash    "SPLOTCHING "    Family History  Problem Relation Age of Onset  . Heart disease Mother   . Diabetes Mother   . Hypertension Mother   . Hyperlipidemia Mother   . COPD Mother   . COPD Father   . Diabetes Father   . Heart disease Father   . Cancer Father        throat  . Dementia Father   . Asthma Son   . Hypertension Sister   . Hyperlipidemia Sister   . Hypertension Brother   . Hyperlipidemia Brother   . Hypertension Brother   . Hyperlipidemia Brother       Prior to Admission medications   Medication Sig Start Date End Date Taking? Authorizing Provider  amLODipine (NORVASC) 10 MG tablet TAKE 1 TABLET DAILY 04/26/17  Yes Timmothy Euler, MD  Aspirin-Salicylamide-Caffeine [facility] HEADACHE POWDER PO) Take 1 packet by mouth daily as needed (headaches).    Yes [provider]  budesonide-formoterol (SYMBICORT) 160-4.5 MCG/ACT inhaler USE 2 INHALATIONS TWICE A DAY 01/29/17  Yes Timmothy Euler, MD  calcium carbonate (OSCAL) 1500 (600 Ca) MG TABS tablet Take 600  mg by mouth daily.    Yes [provider]  cetirizine (ZYRTEC) 10 MG tablet Take 10 mg by mouth daily as needed for allergies. Reported on 03/12/2015   Yes [provider]  Cholecalciferol (D-3-5) 5000 units capsule Take 5,000 Units daily by mouth.   Yes [provider]  diclofenac sodium (VOLTAREN) 1 % GEL Apply 1 application topically daily as needed for muscle pain. 06/30/16  Yes [provider]  DULoxetine (CYMBALTA) 30 MG capsule Take 3 capsules (90 mg total) by mouth daily. 11/16/16  Yes Timmothy Euler, MD  guaiFENesin (MUCINEX) 600 MG 12 hr tablet Take 600 mg by mouth 2 (two) times daily.    Yes [provider]  HYDROcodone Bitartrate ER (HYSINGLA ER) 60 MG T24A Take 1  tablet by mouth daily.   Yes [provider]  HYDROcodone-acetaminophen (NORCO) 7.5-325 MG tablet Take 1 tablet by mouth 4 (four) times daily.   Yes [provider]  ipratropium-albuterol (DUONEB) 0.5-2.5 (3) MG/3ML SOLN Inhale 3 mLs into the lungs 4 (four) times daily as needed (for shortness of breath/wheezing). *May use as needed 02/21/16  Yes [provider]  Iron Polysacch Cmplx-B12-FA 150-0.025-1 MG CAPS Take 1 capsule daily by mouth. 01/19/17  Yes Twana First, MD  levofloxacin (LEVAQUIN) 500 MG tablet Take 1 tablet (500 mg total) by mouth daily for 7 days. 06/30/17 07/07/17 Yes Gottschalk, Leatrice Jewels M, DO  Omega-3 Fatty Acids (FISH OIL) 1000 MG CAPS Take 1 capsule by mouth daily.    Yes [provider]  pantoprazole (PROTONIX) 40 MG tablet TAKE 1 TABLET DAILY 08/17/16  Yes Timmothy Euler, MD  PROAIR HFA 108 [phone] Base) MCG/ACT inhaler USE 2 INHALATIONS EVERY 6 HOURS AS NEEDED FOR WHEEZING OR SHORTNESS OF BREATH 06/25/17  Yes Timmothy Euler, MD  SPIRIVA HANDIHALER 18 MCG inhalation capsule INHALE THE CONTENTS OF 1 CAPSULE DAILY 04/26/17  Yes Timmothy Euler, MD  tizanidine (ZANAFLEX) 6 MG capsule Take 1 capsule 3 (three) times daily by mouth.  11/27/16  Yes [provider]  traZODone (DESYREL) 150 MG tablet Take 1.5-2 tablets (225-300 mg total) at bedtime by mouth. 01/16/17  Yes Timmothy Euler, MD  predniSONE (DELTASONE) 20 MG tablet Take 2 tablets (40 mg total) by mouth daily with breakfast for 5 days. 07/01/17 07/06/17  Janora Norlander, DO    Physical Exam: BP 139/86   Pulse (!) 117   Temp 98.4 F (36.9 C) (Oral)   Resp (!) 24   Ht 5\' 3"  (1.6 m)   Wt 46.7 kg (103 lb)   SpO2 100%   BMI 18.25 kg/m   . General: Elderly Caucasian female. Awake and alert and oriented x3.  becomes extremely short of breath with conversation . HEENT: Normocephalic atraumatic.  Right and left ears normal in appearance.  Pupils equal, round, reactive to light. Extraocular muscles are intact. Sclerae anicteric and noninjected.  Moist mucosal membranes. No mucosal lesions.  . Neck: Neck supple without lymphadenopathy. No carotid bruits. No masses palpated.  . Cardiovascular: Regular rate with normal S1-S2 sounds. No murmurs, rubs, gallops auscultated. No JVD.  Marland Kitchen Respiratory: Tight breath sounds.  Wheezing present.  Prolonged exhalation. . Abdomen: Soft, nontender, nondistended. Active bowel sounds. No masses or hepatosplenomegaly  . Skin: No rashes, lesions, or ulcerations.  Dry, warm to touch. 2+ dorsalis pedis and radial pulses. . Musculoskeletal: No calf or leg pain. All major joints not erythematous nontender.  No upper or lower joint deformation.  Good ROM.  No contractures  . Psychiatric: Intact judgment and insight. Pleasant and cooperative. . Neurologic: No focal neurological deficits. Strength is 5/5 and symmetric in upper and lower extremities.  Cranial nerves II through XII are grossly intact.           Labs on Admission: I have personally reviewed following labs and imaging studies  CBC: Recent Labs  Lab 06/30/17 1508  WBC 13.0*  NEUTROABS 10.3*  HGB 12.0  HCT 37.9  MCV 102.2*  PLT 109   Basic Metabolic  Panel: Recent Labs  Lab 06/30/17 1508  NA 136  K 2.4*  CL 100*  CO2 16*  GLUCOSE 77  BUN 11  CREATININE 0.76  CALCIUM 9.3   GFR: Estimated Creatinine Clearance: 53.1 mL/min (by C-G formula  based on SCr of 0.76 mg/dL). Liver Function Tests: No results for input(s): AST, ALT, ALKPHOS, BILITOT, PROT, ALBUMIN in the last 168 hours. No results for input(s): LIPASE, AMYLASE in the last 168 hours. No results for input(s): AMMONIA in the last 168 hours. Coagulation Profile: No results for input(s): INR, PROTIME in the last 168 hours. Cardiac Enzymes: No results for input(s): CKTOTAL, CKMB, CKMBINDEX, TROPONINI in the last 168 hours. BNP (last 3 results) No results for input(s): PROBNP in the last 8760 hours. HbA1C: No results for input(s): HGBA1C in the last 72 hours. CBG: No results for input(s): GLUCAP in the last 168 hours. Lipid Profile: No results for input(s): CHOL, HDL, LDLCALC, TRIG, CHOLHDL, LDLDIRECT in the last 72 hours. Thyroid Function Tests: No results for input(s): TSH, T4TOTAL, FREET4, T3FREE, THYROIDAB in the last 72 hours. Anemia Panel: No results for input(s): VITAMINB12, FOLATE, FERRITIN, TIBC, IRON, RETICCTPCT in the last 72 hours. Urine analysis:    Component Value Date/Time   COLORURINE YELLOW 09/08/2015 1057   APPEARANCEUR Clear 04/05/2017 0947   LABSPEC 1.015 09/08/2015 1057   PHURINE 5.5 09/08/2015 1057   GLUCOSEU Negative 04/05/2017 0947   HGBUR NEGATIVE 09/08/2015 1057   BILIRUBINUR Negative 04/05/2017 0947   KETONESUR NEGATIVE 09/08/2015 1057   PROTEINUR Trace (A) 04/05/2017 0947   PROTEINUR NEGATIVE 09/08/2015 1057   NITRITE Negative 04/05/2017 0947   NITRITE NEGATIVE 09/08/2015 1057   LEUKOCYTESUR Negative 04/05/2017 0947   Sepsis Labs: @LABRCNTIP (procalcitonin:4,lacticidven:4) )No results found for this or any previous visit (from the past 240 hour(s)).   Radiological Exams on Admission: Dg Chest 2 View  Result Date:  06/30/2017 CLINICAL DATA:  Shortness of breath. EXAM: CHEST - 2 VIEW COMPARISON:  April 05, 2017 FINDINGS: The heart size is borderline. The hila and mediastinum are normal. Increased interstitial markings in the lungs, mildly coarsened. No overt edema. No nodule or mass. No focal infiltrate. No other interval changes. Probable tiny effusions seen on the lateral view. IMPRESSION: 1. Probable tiny pleural effusion. 2. Coarsened interstitium could be seen with pulmonary venous congestion or atypical infection. Recommend clinical correlation. Electronically Signed   By: Dorise Bullion III M.D   On: 06/30/2017 16:15   Ct Angio Chest Pe W And/or Wo Contrast  Result Date: 06/30/2017 CLINICAL DATA:  Dyspnea x3 weeks. EXAM: CT ANGIOGRAPHY CHEST WITH CONTRAST TECHNIQUE: Multidetector CT imaging of the chest was performed using the standard protocol during bolus administration of intravenous contrast. Multiplanar CT image reconstructions and MIPs were obtained to evaluate the vascular anatomy. CONTRAST:  102mL ISOVUE-370 IOPAMIDOL (ISOVUE-370) INJECTION 76% COMPARISON:  CXR from 06/30/2017 and 04/05/2017 FINDINGS: Cardiovascular: Atherosclerosis at the origins of the great vessels with conventional branch pattern off the thoracic aorta. No thoracic aortic aneurysm. There is mild thoracic aortic atherosclerosis. No large central pulmonary embolus to the segmental level. Heart is top-normal in size without pericardial effusion. Mediastinum/Nodes: Small subcentimeter cystic nodules of the thyroid gland without worrisome features likely related to goiter. No thyromegaly. Patent trachea and mainstem bronchi. No mediastinal, hilar nor axillary lymphadenopathy. Lungs/Pleura: Advanced centrilobular emphysema of both lungs. Subpleural opacity at the right lung apex posteriorly compatible with changes of atelectasis or scarring. A pulmonary lesion is not excluded but believed less likely. Bandlike atelectasis in the right middle  lobe. Subsegmental atelectasis in the lingula. Right minor fissure pleural thickening measuring 9 x 5 x 5 mm is noted versus a fissural lymph node. No effusion or pneumothorax. Upper Abdomen: . dilatation of the common bowel duct  without calculus measuring up to 1.7 cm. No pancreatic ductal dilatation. There is a patent steatosis without space-occupying mass of the included liver. 7 mm cyst in the upper pole the left kidney. Musculoskeletal: Low anterior cervical disc fusion. Neural stimulator leads project up to the midthoracic spine to approximately T6-7. Review of the MIP images confirms the above findings. IMPRESSION: 1. Over arching finding of advanced centrilobular emphysema of the lungs. Right middle and lingular atelectasis. No effusion, CHF or pneumonic consolidation. 2. No acute pulmonary embolus. 3. Probable intrafissural lymph node along the minor fissure versus focal pleural thickening measuring 9 x 5 x 5 mm. 4. Too small to further characterize subcentimeter hypodense cystic thyroid nodules bilaterally. No thyromegaly. 5. Hepatic steatosis. 6. Ectatic common bile duct without calculus. Aortic Atherosclerosis (ICD10-I70.0) and Emphysema (ICD10-J43.9). Electronically Signed   By: Ashley Royalty M.D.   On: 06/30/2017 18:29    EKG: Independently reviewed.  Sinus rhythm.  Prolonged PR.  LVH.  No acute ST changes.  Assessment/Plan: Principal Problem:   Acute respiratory failure with hypoxia (HCC) Active Problems:   Chronic back pain   HTN (hypertension)   Chronic narcotic use   Hypokalemia   COPD with acute exacerbation [facility])    This patient was discussed with the ED physician, including pertinent vitals, physical exam findings, labs, and imaging.  We also discussed care given by the ED provider.  1. Acute respiratory failure with hypoxia a. Admit to ICU b. Continue BiPAP 2. COPD with acute exacerbation Antibiotics: Levaquin DuoNeb's every 6hrs scheduled with albuterol every 1 hr when  necessary Continue inhaled steroids and LA bronchodilator Solu-Medrol 60 mg IV every 6 hours Mucinex 3. Hypokalemia a. Patient started to receive replacement in the emergency department. b. We will give 40 mEq p.o. 3 times a day c. Recheck potassium in the morning d. Check magnesium 4. Hypertension a. Continue antihypertensives 5. Chronic back pain a. Continue pain medicines 6. Chronic narcotic use  DVT prophylaxis: Lovenox Consultants: None Code Status: Full code Family Communication: Multiple family members present including sister, spouse, mother Disposition Plan: Patient to return home following improvement in respiratory status   Jacob Moores [facility] Pager [phone]  If 7PM-7AM, please contact night-coverage [email or web address] Password [email or web address]

## 2017-06-30 NOTE — ED Triage Notes (Signed)
Patient reports SOB x 3 weeks. Seen by PCP this am, sats in upper 80s. Patient states she got a steroid injection this am and abx. Patient states before she left home her sats were in the 64s. 87% in Triage. Patient reports history of COPD.

## 2017-07-01 ENCOUNTER — Encounter (HOSPITAL_COMMUNITY): Payer: Self-pay | Admitting: Family Medicine

## 2017-07-01 DIAGNOSIS — M5441 Lumbago with sciatica, right side: Secondary | ICD-10-CM

## 2017-07-01 DIAGNOSIS — G8929 Other chronic pain: Secondary | ICD-10-CM

## 2017-07-01 DIAGNOSIS — F1190 Opioid use, unspecified, uncomplicated: Secondary | ICD-10-CM

## 2017-07-01 DIAGNOSIS — E876 Hypokalemia: Secondary | ICD-10-CM

## 2017-07-01 DIAGNOSIS — I10 Essential (primary) hypertension: Secondary | ICD-10-CM

## 2017-07-01 DIAGNOSIS — J441 Chronic obstructive pulmonary disease with (acute) exacerbation: Principal | ICD-10-CM

## 2017-07-01 LAB — RESPIRATORY PANEL BY PCR
ADENOVIRUS-RVPPCR: NOT DETECTED
Bordetella pertussis: NOT DETECTED
CORONAVIRUS 229E-RVPPCR: NOT DETECTED
CORONAVIRUS HKU1-RVPPCR: NOT DETECTED
CORONAVIRUS OC43-RVPPCR: NOT DETECTED
Chlamydophila pneumoniae: NOT DETECTED
Coronavirus NL63: NOT DETECTED
INFLUENZA B-RVPPCR: NOT DETECTED
Influenza A: NOT DETECTED
MYCOPLASMA PNEUMONIAE-RVPPCR: NOT DETECTED
Metapneumovirus: NOT DETECTED
PARAINFLUENZA VIRUS 1-RVPPCR: NOT DETECTED
Parainfluenza Virus 2: NOT DETECTED
Parainfluenza Virus 3: NOT DETECTED
Parainfluenza Virus 4: NOT DETECTED
RESPIRATORY SYNCYTIAL VIRUS-RVPPCR: NOT DETECTED
Rhinovirus / Enterovirus: NOT DETECTED

## 2017-07-01 LAB — CBC
HEMATOCRIT: 36.9 % (ref 36.0–46.0)
HEMOGLOBIN: 11.3 g/dL — AB (ref 12.0–15.0)
MCH: 32.1 pg (ref 26.0–34.0)
MCHC: 30.6 g/dL (ref 30.0–36.0)
MCV: 104.8 fL — ABNORMAL HIGH (ref 78.0–100.0)
Platelets: 238 10*3/uL (ref 150–400)
RBC: 3.52 MIL/uL — ABNORMAL LOW (ref 3.87–5.11)
RDW: 14.8 % (ref 11.5–15.5)
WBC: 5.7 10*3/uL (ref 4.0–10.5)

## 2017-07-01 LAB — BASIC METABOLIC PANEL
ANION GAP: 17 — AB (ref 5–15)
BUN: 11 mg/dL (ref 6–20)
CO2: 18 mmol/L — ABNORMAL LOW (ref 22–32)
Calcium: 8.8 mg/dL — ABNORMAL LOW (ref 8.9–10.3)
Chloride: 109 mmol/L (ref 101–111)
Creatinine, Ser: 0.68 mg/dL (ref 0.44–1.00)
GFR calc Af Amer: >60 mL/min (ref >60)
GLUCOSE: 142 mg/dL — AB (ref 65–99)
POTASSIUM: 3.7 mmol/L (ref 3.5–5.1)
Sodium: 144 mmol/L (ref 135–145)

## 2017-07-01 LAB — MRSA PCR SCREENING: MRSA by PCR: NEGATIVE

## 2017-07-01 MED ORDER — HYDROCODONE BITARTRATE ER 60 MG PO T24A
1.0000 | EXTENDED_RELEASE_TABLET | Freq: Every day | ORAL | Status: DC
  Administered 2017-07-01 – 2017-07-02 (×2): 1 via ORAL
  Filled 2017-07-01 (×2): qty 60
  Filled 2017-07-01: qty 1
  Filled 2017-07-01 (×4): qty 60
  Filled 2017-07-01: qty 1

## 2017-07-01 MED ORDER — HYDROCODONE BITARTRATE ER 60 MG PO T24A: 1.0000 | EXTENDED_RELEASE_TABLET | Freq: Every day | ORAL | Status: DC

## 2017-07-01 MED ORDER — ALBUTEROL SULFATE (2.5 MG/3ML) 0.083% IN NEBU
2.5000 mg | INHALATION_SOLUTION | RESPIRATORY_TRACT | Status: DC | PRN
  Administered 2017-07-01: 2.5 mg via RESPIRATORY_TRACT
  Filled 2017-07-01: qty 3

## 2017-07-01 NOTE — Progress Notes (Signed)
PROGRESS NOTE    Jasmine [name]  TKZ:[id number]  DOB: 1955/12/05  DOA: 06/30/2017 PCP: Timmothy Euler, MD   Brief Admission Hx: Jasmine [name] is a 62 y.o. female with a history of COPD, GERD, neuropathy, chronic pain on chronic narcotics, hypertension.  Patient presents with worsening shortness of breath over the past 3 to 4 weeks.  She presented to ED with acute respiratory distress with hypoxia and subsequently started on bipap.   MDM/Assessment & Plan:   1. Acute respiratory failure with hypoxia-patient says that she responded very well to nightly BiPAP.  She was able to sleep for the first time in a very long time.  I suspect she likely has some undiagnosed sleep apnea and should be tested at some point for CPAP.  She would need to do this through her primary care physician after she is discharged.  Continue current treatments.  All she is having some improvement. 2. COPD with acute exacerbation- continue current treatments with IV steroids, antibiotics and continuous around-the-clock neb treatments. 3. Essential Hypertension-stable. 4. Hypokalemia-replacement ordered.  Check magnesium. 5. Chronic pain with opioid dependence-resume home pain med management regimen which has been stable per patient.  DVT prophylaxis: lovenox Code Status: Full  Family Communication:  Disposition Plan: ongoing inpatient medical treatment  Subjective: Pt remains on bipap therapy.    Objective: Vitals:   07/01/17 0200 07/01/17 0358 07/01/17 0400 07/01/17 0500  BP:  101/67    Pulse: 92 70    Resp: (!) 22 14    Temp:   97.9 F (36.6 C)   TempSrc:   Axillary   SpO2: 96% 97%    Weight:    47.2 kg (104 lb 0.9 oz)  Height:        Intake/Output Summary (Last 24 hours) at 07/01/2017 5732 Last data filed at 07/01/2017 0030 Gross per 24 hour  Intake 1200 ml  Output 1000 ml  Net 200 ml   Filed Weights   06/30/17 1444 06/30/17 2122 07/01/17 0500  Weight: 46.7 kg (103 lb) 47.2 kg (104 lb  0.9 oz) 47.2 kg (104 lb 0.9 oz)   REVIEW OF SYSTEMS  As per history otherwise all reviewed and reported negative  Exam:  General exam: Awake, alert, no distress, cooperative and pleasant Respiratory system: Shallow breathing, fairly clear with occasional expiratory wheezes heard. No increased work of breathing.  On BiPAP. Cardiovascular system: S1 & S2 heard, RRR. No JVD, murmurs, gallops, clicks or pedal edema. Gastrointestinal system: Abdomen is nondistended, soft and nontender. Normal bowel sounds heard. Central nervous system: Alert and oriented. No focal neurological deficits. Extremities: no CCE.  Data Reviewed: Basic Metabolic Panel: Recent Labs  Lab 06/30/17 1508  NA 136  K 2.4*  CL 100*  CO2 16*  GLUCOSE 77  BUN 11  CREATININE 0.76  CALCIUM 9.3  MG 1.7   Liver Function Tests: No results for input(s): AST, ALT, ALKPHOS, BILITOT, PROT, ALBUMIN in the last 168 hours. No results for input(s): LIPASE, AMYLASE in the last 168 hours. No results for input(s): AMMONIA in the last 168 hours. CBC: Recent Labs  Lab 06/30/17 1508 07/01/17 0507  WBC 13.0* 5.7  NEUTROABS 10.3*  --   HGB 12.0 11.3*  HCT 37.9 36.9  MCV 102.2* 104.8*  PLT 229 238   Cardiac Enzymes: No results for input(s): CKTOTAL, CKMB, CKMBINDEX, TROPONINI in the last 168 hours. CBG (last 3)  No results for input(s): GLUCAP in the last 72 hours. No results found for  this or any previous visit (from the past 240 hour(s)).   Studies: Dg Chest 2 View  Result Date: 06/30/2017 CLINICAL DATA:  Shortness of breath. EXAM: CHEST - 2 VIEW COMPARISON:  April 05, 2017 FINDINGS: The heart size is borderline. The hila and mediastinum are normal. Increased interstitial markings in the lungs, mildly coarsened. No overt edema. No nodule or mass. No focal infiltrate. No other interval changes. Probable tiny effusions seen on the lateral view. IMPRESSION: 1. Probable tiny pleural effusion. 2. Coarsened interstitium  could be seen with pulmonary venous congestion or atypical infection. Recommend clinical correlation. Electronically Signed   By: Dorise Bullion III M.D   On: 06/30/2017 16:15   Ct Angio Chest Pe W And/or Wo Contrast  Result Date: 06/30/2017 CLINICAL DATA:  Dyspnea x3 weeks. EXAM: CT ANGIOGRAPHY CHEST WITH CONTRAST TECHNIQUE: Multidetector CT imaging of the chest was performed using the standard protocol during bolus administration of intravenous contrast. Multiplanar CT image reconstructions and MIPs were obtained to evaluate the vascular anatomy. CONTRAST:  48mL ISOVUE-370 IOPAMIDOL (ISOVUE-370) INJECTION 76% COMPARISON:  CXR from 06/30/2017 and 04/05/2017 FINDINGS: Cardiovascular: Atherosclerosis at the origins of the great vessels with conventional branch pattern off the thoracic aorta. No thoracic aortic aneurysm. There is mild thoracic aortic atherosclerosis. No large central pulmonary embolus to the segmental level. Heart is top-normal in size without pericardial effusion. Mediastinum/Nodes: Small subcentimeter cystic nodules of the thyroid gland without worrisome features likely related to goiter. No thyromegaly. Patent trachea and mainstem bronchi. No mediastinal, hilar nor axillary lymphadenopathy. Lungs/Pleura: Advanced centrilobular emphysema of both lungs. Subpleural opacity at the right lung apex posteriorly compatible with changes of atelectasis or scarring. A pulmonary lesion is not excluded but believed less likely. Bandlike atelectasis in the right middle lobe. Subsegmental atelectasis in the lingula. Right minor fissure pleural thickening measuring 9 x 5 x 5 mm is noted versus a fissural lymph node. No effusion or pneumothorax. Upper Abdomen: . dilatation of the common bowel duct without calculus measuring up to 1.7 cm. No pancreatic ductal dilatation. There is a patent steatosis without space-occupying mass of the included liver. 7 mm cyst in the upper pole the left kidney. Musculoskeletal:  Low anterior cervical disc fusion. Neural stimulator leads project up to the midthoracic spine to approximately T6-7. Review of the MIP images confirms the above findings. IMPRESSION: 1. Over arching finding of advanced centrilobular emphysema of the lungs. Right middle and lingular atelectasis. No effusion, CHF or pneumonic consolidation. 2. No acute pulmonary embolus. 3. Probable intrafissural lymph node along the minor fissure versus focal pleural thickening measuring 9 x 5 x 5 mm. 4. Too small to further characterize subcentimeter hypodense cystic thyroid nodules bilaterally. No thyromegaly. 5. Hepatic steatosis. 6. Ectatic common bile duct without calculus. Aortic Atherosclerosis (ICD10-I70.0) and Emphysema (ICD10-J43.9). Electronically Signed   By: Ashley Royalty M.D.   On: 06/30/2017 18:29     Scheduled Meds: . amLODipine  10 mg Oral Daily  . chlorhexidine  15 mL Mouth Rinse BID  . DULoxetine  30 mg Oral TID  . enoxaparin (LOVENOX) injection  40 mg Subcutaneous Q24H  . guaiFENesin  600 mg Oral BID  . HYDROcodone-acetaminophen  1 tablet Oral Q6H  . ipratropium-albuterol  3 mL Nebulization QID  . loratadine  10 mg Oral Daily  . mouth rinse  15 mL Mouth Rinse q12n4p  . methylPREDNISolone (SOLU-MEDROL) injection  60 mg Intravenous Q6H  . mometasone-formoterol  2 puff Inhalation BID  . nicotine  14 mg Transdermal  Daily  . pantoprazole  40 mg Oral Daily  . potassium chloride  40 mEq Oral TID  . tiZANidine  6 mg Oral TID  . traZODone  225-300 mg Oral QHS   Continuous Infusions: . levofloxacin (LEVAQUIN) IV Stopped (06/30/17 3094)    Principal Problem:   Acute respiratory failure with hypoxia (HCC) Active Problems:   Chronic back pain   HTN (hypertension)   Chronic narcotic use   Hypokalemia   COPD with acute exacerbation [facility])   Critical Care Time spent: 31 minutes  Irwin Brakeman, MD, FAAFP [facility] Pager [phone]  If 7PM-7AM, please contact  night-coverage [email or web address] Password [facility] 07/01/2017, 6:33 AM    LOS: 1 day

## 2017-07-02 ENCOUNTER — Encounter: Payer: Self-pay | Admitting: Family Medicine

## 2017-07-02 DIAGNOSIS — E440 Moderate protein-calorie malnutrition: Secondary | ICD-10-CM

## 2017-07-02 LAB — HIV ANTIBODY (ROUTINE TESTING W REFLEX): HIV Screen 4th Generation wRfx: NONREACTIVE

## 2017-07-02 MED ORDER — LEVOFLOXACIN 500 MG PO TABS
500.0000 mg | ORAL_TABLET | Freq: Every day | ORAL | Status: DC
  Administered 2017-07-02 – 2017-07-03 (×2): 500 mg via ORAL
  Filled 2017-07-02 (×2): qty 1

## 2017-07-02 MED ORDER — ENSURE ENLIVE PO LIQD
237.0000 mL | Freq: Two times a day (BID) | ORAL | Status: DC
  Administered 2017-07-03 – 2017-07-04 (×4): 237 mL via ORAL

## 2017-07-02 MED ORDER — METHYLPREDNISOLONE SODIUM SUCC 125 MG IJ SOLR
60.0000 mg | Freq: Three times a day (TID) | INTRAMUSCULAR | Status: DC
  Administered 2017-07-02 – 2017-07-04 (×7): 60 mg via INTRAVENOUS
  Filled 2017-07-02 (×7): qty 2

## 2017-07-02 MED ORDER — ADULT MULTIVITAMIN W/MINERALS CH
1.0000 | ORAL_TABLET | Freq: Every day | ORAL | Status: DC
  Administered 2017-07-02 – 2017-07-04 (×3): 1 via ORAL
  Filled 2017-07-02 (×3): qty 1

## 2017-07-02 NOTE — Care Management Note (Signed)
Case Management Note  Patient Details  Name: Jasmine [name] MRN: [id number] Date of Birth: 1956-02-23  Subjective/Objective:   Acute respiratory failure with hypoxia. COPD.  From home with husband. Has Rollator, cane, BSC, shower chair, grab bars in shower. Acutely on oxygen. Recommended for [facility] PT and OT. Patient agreeable to home health. Would like [facility] for home health and WC.                  Action/Plan: DC home with home health. Jasmine [name] of [facility] notified and will obtain orders when available. WC will be delivered to room. Following for oxygen needs.   Expected Discharge Date:  07/04/17               Expected Discharge Plan:  [place]  In-House Referral:     Discharge planning Services  CM Consult  Post Acute Care Choice:  Home Health, Durable Medical Equipment Choice offered to:  Patient  DME Arranged:  Wheelchair manual DME Agency:  [place]:  PT, OT, RN [facility] Agency:  [place]  Status of Service:  In process, will continue to follow  If discussed at Long Length of Stay Meetings, dates discussed:    Additional Comments:  Jasmine [name], Jasmine [place], RN 07/02/2017, 2:28 PM

## 2017-07-02 NOTE — Progress Notes (Signed)
PROGRESS NOTE    Jasmine [name]  DUK:[id number]  DOB: 02/21/1956  DOA: 06/30/2017 PCP: Jasmine Euler, MD   Brief Admission Hx: Jasmine [name] is a 62 y.o. female with a history of COPD, GERD, neuropathy, chronic pain on chronic narcotics, hypertension.  Patient presents with worsening shortness of breath over the past 3 to 4 weeks.  She presented to ED with acute respiratory distress with hypoxia and subsequently started on bipap.   MDM/Assessment & Plan:   1. Acute respiratory failure with hypoxia-patient says that she responded very well to nightly BiPAP.  She was able to sleep for the first time in a very long time.  I suspect she likely has some undiagnosed sleep apnea and should be tested at some point for CPAP.  She would need to do this through her primary care physician after she is discharged.  Continue current treatments.  All she is having some improvement.  With improvement, will transfer to med-surg.  2. COPD with acute exacerbation- continue current treatments with IV steroids, antibiotics and continuous around-the-clock neb treatments. 3. Essential Hypertension-stable. 4. Hypokalemia-replacement ordered.  Check magnesium. 5. Chronic pain with opioid dependence-resume home pain med management regimen which has been stable per patient.  DVT prophylaxis: lovenox Code Status: Full  Family Communication:  Disposition Plan: ongoing inpatient medical treatment  Subjective: Pt says she is feeling a lot better, breathing better, wanted to get off bipap this morning.     Objective: Vitals:   07/02/17 0905 07/02/17 1000 07/02/17 1030 07/02/17 1124  BP:  (!) 152/77    Pulse:  97 (!) 128   Resp:  17    Temp:    (!) 97.5 F (36.4 C)  TempSrc:    Axillary  SpO2: 98% 95% 90% 96%  Weight:      Height:        Intake/Output Summary (Last 24 hours) at 07/02/2017 1258 Last data filed at 07/02/2017 0741 Gross per 24 hour  Intake 100 ml  Output 2250 ml  Net -2150 ml    Filed Weights   06/30/17 2122 07/01/17 0500 07/02/17 0412  Weight: 47.2 kg (104 lb 0.9 oz) 47.2 kg (104 lb 0.9 oz) 48.1 kg (106 lb 0.7 oz)   REVIEW OF SYSTEMS  As per history otherwise all reviewed and reported negative  Exam:  General exam: Awake, alert, no distress, cooperative and pleasant Respiratory system:  fairly clear with occasional expiratory wheezes heard. No increased work of breathing.  Cardiovascular system: S1 & S2 heard, RRR. No JVD, murmurs, gallops, clicks or pedal edema. Gastrointestinal system: Abdomen is nondistended, soft and nontender. Normal bowel sounds heard. Central nervous system: Alert and oriented. No focal neurological deficits. Extremities: no CCE.  Data Reviewed: Basic Metabolic Panel: Recent Labs  Lab 06/30/17 1508 07/01/17 0507  NA 136 144  K 2.4* 3.7  CL 100* 109  CO2 16* 18*  GLUCOSE 77 142*  BUN 11 11  CREATININE 0.76 0.68  CALCIUM 9.3 8.8*  MG 1.7  --    Liver Function Tests: No results for input(s): AST, ALT, ALKPHOS, BILITOT, PROT, ALBUMIN in the last 168 hours. No results for input(s): LIPASE, AMYLASE in the last 168 hours. No results for input(s): AMMONIA in the last 168 hours. CBC: Recent Labs  Lab 06/30/17 1508 07/01/17 0507  WBC 13.0* 5.7  NEUTROABS 10.3*  --   HGB 12.0 11.3*  HCT 37.9 36.9  MCV 102.2* 104.8*  PLT 229 238   Cardiac Enzymes: No  results for input(s): CKTOTAL, CKMB, CKMBINDEX, TROPONINI in the last 168 hours. CBG (last 3)  No results for input(s): GLUCAP in the last 72 hours. Recent Results (from the past 240 hour(s))  MRSA PCR Screening     Status: None   Collection Time: 06/30/17 10:20 PM  Result Value Ref Range Status   MRSA by PCR NEGATIVE NEGATIVE Final    Comment:        The GeneXpert MRSA Assay (FDA approved for NASAL specimens only), is one component of a comprehensive MRSA colonization surveillance program. It is not intended to diagnose MRSA infection nor to guide or monitor  treatment for MRSA infections. Performed at [facility], [address], [place], [place] [address]   Respiratory Panel by PCR     Status: None   Collection Time: 07/01/17  6:00 AM  Result Value Ref Range Status   Adenovirus NOT DETECTED NOT DETECTED Final   Coronavirus 229E NOT DETECTED NOT DETECTED Final   Coronavirus HKU1 NOT DETECTED NOT DETECTED Final   Coronavirus NL63 NOT DETECTED NOT DETECTED Final   Coronavirus OC43 NOT DETECTED NOT DETECTED Final   Metapneumovirus NOT DETECTED NOT DETECTED Final   Rhinovirus / Enterovirus NOT DETECTED NOT DETECTED Final   Influenza A NOT DETECTED NOT DETECTED Final   Influenza B NOT DETECTED NOT DETECTED Final   Parainfluenza Virus 1 NOT DETECTED NOT DETECTED Final   Parainfluenza Virus 2 NOT DETECTED NOT DETECTED Final   Parainfluenza Virus 3 NOT DETECTED NOT DETECTED Final   Parainfluenza Virus 4 NOT DETECTED NOT DETECTED Final   Respiratory Syncytial Virus NOT DETECTED NOT DETECTED Final   Bordetella pertussis NOT DETECTED NOT DETECTED Final   Chlamydophila pneumoniae NOT DETECTED NOT DETECTED Final   Mycoplasma pneumoniae NOT DETECTED NOT DETECTED Final    Comment: Performed at [facility], [place] [address], [place], [place] [address]     Studies: Dg Chest 2 View  Result Date: 06/30/2017 CLINICAL DATA:  Shortness of breath. EXAM: CHEST - 2 VIEW COMPARISON:  April 05, 2017 FINDINGS: The heart size is borderline. The hila and mediastinum are normal. Increased interstitial markings in the lungs, mildly coarsened. No overt edema. No nodule or mass. No focal infiltrate. No other interval changes. Probable tiny effusions seen on the lateral view. IMPRESSION: 1. Probable tiny pleural effusion. 2. Coarsened interstitium could be seen with pulmonary venous congestion or atypical infection. Recommend clinical correlation. Electronically Signed   By: Dorise Bullion III M.D   On: 06/30/2017 16:15   Ct Angio Chest Pe W And/or Wo  Contrast  Result Date: 06/30/2017 CLINICAL DATA:  Dyspnea x3 weeks. EXAM: CT ANGIOGRAPHY CHEST WITH CONTRAST TECHNIQUE: Multidetector CT imaging of the chest was performed using the standard protocol during bolus administration of intravenous contrast. Multiplanar CT image reconstructions and MIPs were obtained to evaluate the vascular anatomy. CONTRAST:  77mL ISOVUE-370 IOPAMIDOL (ISOVUE-370) INJECTION 76% COMPARISON:  CXR from 06/30/2017 and 04/05/2017 FINDINGS: Cardiovascular: Atherosclerosis at the origins of the great vessels with conventional branch pattern off the thoracic aorta. No thoracic aortic aneurysm. There is mild thoracic aortic atherosclerosis. No large central pulmonary embolus to the segmental level. Heart is top-normal in size without pericardial effusion. Mediastinum/Nodes: Small subcentimeter cystic nodules of the thyroid gland without worrisome features likely related to goiter. No thyromegaly. Patent trachea and mainstem bronchi. No mediastinal, hilar nor axillary lymphadenopathy. Lungs/Pleura: Advanced centrilobular emphysema of both lungs. Subpleural opacity at the right lung apex posteriorly compatible with changes of atelectasis  or scarring. A pulmonary lesion is not excluded but believed less likely. Bandlike atelectasis in the right middle lobe. Subsegmental atelectasis in the lingula. Right minor fissure pleural thickening measuring 9 x 5 x 5 mm is noted versus a fissural lymph node. No effusion or pneumothorax. Upper Abdomen: . dilatation of the common bowel duct without calculus measuring up to 1.7 cm. No pancreatic ductal dilatation. There is a patent steatosis without space-occupying mass of the included liver. 7 mm cyst in the upper pole the left kidney. Musculoskeletal: Low anterior cervical disc fusion. Neural stimulator leads project up to the midthoracic spine to approximately T6-7. Review of the MIP images confirms the above findings. IMPRESSION: 1. Over arching finding of  advanced centrilobular emphysema of the lungs. Right middle and lingular atelectasis. No effusion, CHF or pneumonic consolidation. 2. No acute pulmonary embolus. 3. Probable intrafissural lymph node along the minor fissure versus focal pleural thickening measuring 9 x 5 x 5 mm. 4. Too small to further characterize subcentimeter hypodense cystic thyroid nodules bilaterally. No thyromegaly. 5. Hepatic steatosis. 6. Ectatic common bile duct without calculus. Aortic Atherosclerosis (ICD10-I70.0) and Emphysema (ICD10-J43.9). Electronically Signed   By: Ashley Royalty M.D.   On: 06/30/2017 18:29   Scheduled Meds: . amLODipine  10 mg Oral Daily  . chlorhexidine  15 mL Mouth Rinse BID  . DULoxetine  30 mg Oral TID  . enoxaparin (LOVENOX) injection  40 mg Subcutaneous Q24H  . guaiFENesin  600 mg Oral BID  . HYDROcodone Bitartrate ER  1 tablet Oral Daily  . HYDROcodone-acetaminophen  1 tablet Oral Q6H  . ipratropium-albuterol  3 mL Nebulization QID  . levofloxacin  500 mg Oral Daily  . loratadine  10 mg Oral Daily  . mouth rinse  15 mL Mouth Rinse q12n4p  . methylPREDNISolone (SOLU-MEDROL) injection  60 mg Intravenous Q8H  . mometasone-formoterol  2 puff Inhalation BID  . nicotine  14 mg Transdermal Daily  . pantoprazole  40 mg Oral Daily  . potassium chloride  40 mEq Oral TID  . tiZANidine  6 mg Oral TID  . traZODone  225-300 mg Oral QHS   Continuous Infusions:   Principal Problem:   Acute respiratory failure with hypoxia (HCC) Active Problems:   Chronic back pain   HTN (hypertension)   Chronic narcotic use   Hypokalemia   COPD with acute exacerbation [facility])   Critical Care Time spent: 34 minutes  Irwin Brakeman, MD, FAAFP [facility] Pager [phone]  If 7PM-7AM, please contact night-coverage [email or web address] Password [email or web address] 07/02/2017, 12:58 PM    LOS: 2 days

## 2017-07-02 NOTE — Evaluation (Signed)
Occupational Therapy Evaluation Patient Details Name: Jasmine [name] MRN: [id number] DOB: 15-Sep-1955 Today's Date: 07/02/2017    History of Present Illness Jasmine [name] is a 62 y.o. female with a history of COPD, GERD, neuropathy, chronic pain on chronic narcotics, hypertension.  Patient presents with worsening shortness of breath over the past 3 to 4 weeks.  Patient contacted her PCP at the beginning, who prescribed Ceftin ear.  She completed this course and felt okay for a few days, and then began to become worse again.  Patient saw her PCP today and was noted to have hypoxia there.  Patient had a breathing treatment, IM steroid injection, and given a prescription for Levaquin with instructions to follow-up here if her respiratory status diminished further.  Patient went home and had oxygen saturations in the 70s.  She presented here for evaluation.  Her shortness of breath is worse with exertion and improved with rest.  She does have a cough that is productive with white sputum.    Clinical Impression   Patient in bed upon therapy arrival and agreeable to participate in OT evaluation. Patient presents with decreased BUE strength and overall endurance during ADL completion. At baseline, patient receives assistance from her husband at home with all daily tasks. Patient reports that this exacerbation of her COPD has really effected her ability to even transfer to the [facility]. She reports that she does not have oxygen at home or a wheelchair. Recommending that patient discharge home with [facility] OT to continue working on BUE strength and activity tolerance while increasing her endurance. Patient was presented with energy conservation handout and briefly discussed strategies to use at home.     Follow Up Recommendations  Home health OT    Equipment Recommendations  Wheelchair cushion (measurements OT);Wheelchair (measurements OT)    Recommendations for Other Services       Precautions / Restrictions  Precautions Precautions: Fall Precaution Comments: patient reports that she has been unable to stand up due to her SOB.  Restrictions Weight Bearing Restrictions: No             ADL either performed or assessed with clinical judgement   ADL     General ADL Comments: patient declined to transfer out of bed during evaluation as she was worried she would have no energy for the PT evaluation.      Vision Baseline Vision/History: Wears glasses Wears Glasses: At all times Patient Visual Report: No change from baseline              Pertinent Vitals/Pain Pain Assessment: No/denies pain     Hand Dominance Right   Extremity/Trunk Assessment Upper Extremity Assessment Upper Extremity Assessment: Generalized weakness   Lower Extremity Assessment Lower Extremity Assessment: Defer to PT evaluation       Communication Communication Communication: No difficulties   Cognition Arousal/Alertness: Awake/alert Behavior During Therapy: WFL for tasks assessed/performed Overall Cognitive Status: Within Functional Limits for tasks assessed                      Home Living Family/patient expects to be discharged to:: Private residence Living Arrangements: Spouse/significant other;Other relatives(Mother-in-law (requires physical assistance)) Available Help at Discharge: Family;Available 24 hours/day Type of Home: House Home Access: Stairs to enter [employer] of Steps: 3 Entrance Stairs-Rails: Left Home Layout: One level     Bathroom Shower/Tub: Occupational psychologist: Standard     Home Equipment: Environmental consultant - 4 wheels;Cane - single point;Bedside commode;Shower  seat   Additional Comments: Pt reports right foot drop and she does not use the cane for this reason.       Prior Functioning/Environment Level of Independence: Needs assistance  Gait / Transfers Assistance Needed: patient reports minimal ambulation at home.  ADL's / Homemaking Assistance  Needed: Husband assists with meal prep, showers, dressing, and toileting.    Comments: Patient does not have a BIPAP or home oxygen. At one time, she did have a rented wheelchair.         OT Problem List: Decreased strength;Decreased activity tolerance      OT Treatment/Interventions:      OT Goals(Current goals can be found in the care plan section) Acute Rehab OT Goals Patient Stated Goal: To go home.  OT Frequency:      AM-PAC PT "6 Clicks" Daily Activity     Outcome Measure Help from another person eating meals?: None Help from another person taking care of personal grooming?: None Help from another person toileting, which includes using toliet, bedpan, or urinal?: A Lot Help from another person bathing (including washing, rinsing, drying)?: A Lot Help from another person to put on and taking off regular upper body clothing?: A Lot Help from another person to put on and taking off regular lower body clothing?: A Lot 6 Click Score: 16   End of Session Equipment Utilized During Treatment: Oxygen  Activity Tolerance: Patient limited by fatigue Patient left: in bed;with call bell/phone within reach  OT Visit Diagnosis: Muscle weakness (generalized) (M62.81)                Time: 5790-3833 OT Time Calculation (min): 12 min Charges:  OT General Charges $OT Visit: 1 Visit OT Evaluation $OT Eval Low Complexity: 1 Low G-Codes:     Ailene Ravel, OTR/L,CBIS  [phone]  Syncere Eble, Clarene Duke 07/02/2017, 8:56 AM

## 2017-07-02 NOTE — Progress Notes (Signed)
Initial Nutrition Assessment  DOCUMENTATION CODES:   Non-severe (moderate) malnutrition in context of chronic illness  INTERVENTION:  Ensure Enlive po BID, each supplement provides 350 kcal and 20 grams of protein   Multivitamin daily   NUTRITION DIAGNOSIS:   Moderate Malnutrition(Chronic) related to early satiety, chronic illness(COPD- incrased shortness of breath the past 3-4 weeks. difficulty consuming adequate nutrition) as evidenced by per patient/family report, percent weight loss, mild fat depletion, moderate fat depletion, mild muscle depletion, moderate muscle depletion.   GOAL:   Patient will meet greater than or equal to 90% of their needs  MONITOR:   PO intake, Supplement acceptance, Labs, Weight trends  REASON FOR ASSESSMENT:   Consult Assessment of nutrition requirement/status, COPD Protocol  ASSESSMENT: PMH: COPD, Asthma, Arthritis, Anemia, GERD. Patient presents with complaint of increased shortness of breath the several weeks. Patient not currently on O2 at home but is hoping she will be at discharge. She uses a walker at home. The patient is a 62 yo female from home with husband. He is primary caregiver. Husband cooks meals, helps with showers and dressing and dispenses meds. Pt is able to feed herself.  Her weight is down significantly in the past 2 months (9%). Patient contributes this to lack of appetite and "filling up easily" (early satiety).  She likes vegetables, varies meats. She is not a big breakfast eater but has been drinking Ensure recently with her oatmeal. Patient limits added salt and excess sugar and likes any vegetable. She drinks water mostly, diet Dr Malachi Bonds, Orange juice and Sprite zero. She takes calcium plus vitamin D3 (5K), B6/B12, Fish oil and Poly Iron at home.  Last night and this morning meals she has eaten 25-50% per pt. She says this is better than what she has been taking in at home the past month.    Labs: BMP Latest Ref Rng & Units  07/01/2017 06/30/2017 04/24/2017  Glucose 65 - 99 mg/dL 142(H) 77 60(L)  BUN 6 - 20 mg/dL 11 11 9   Creatinine 0.44 - 1.00 mg/dL 0.68 0.76 0.55(L)  BUN/Creat Ratio 12 - 28 - - 16  Sodium 135 - 145 mmol/L 144 136 142  Potassium 3.5 - 5.1 mmol/L 3.7 2.4(LL) 3.6  Chloride 101 - 111 mmol/L 109 100(L) 98  CO2 22 - 32 mmol/L 18(L) 16(L) 22  Calcium 8.9 - 10.3 mg/dL 8.8(L) 9.3 9.1    Scheduled Meds: . amLODipine  10 mg Oral Daily  . chlorhexidine  15 mL Mouth Rinse BID  . DULoxetine  30 mg Oral TID  . enoxaparin (LOVENOX) injection  40 mg Subcutaneous Q24H  . guaiFENesin  600 mg Oral BID  . HYDROcodone Bitartrate ER  1 tablet Oral Daily  . HYDROcodone-acetaminophen  1 tablet Oral Q6H  . ipratropium-albuterol  3 mL Nebulization QID  . levofloxacin  500 mg Oral Daily  . loratadine  10 mg Oral Daily  . mouth rinse  15 mL Mouth Rinse q12n4p  . methylPREDNISolone (SOLU-MEDROL) injection  60 mg Intravenous Q8H  . mometasone-formoterol  2 puff Inhalation BID  . nicotine  14 mg Transdermal Daily  . pantoprazole  40 mg Oral Daily  . potassium chloride  40 mEq Oral TID  . tiZANidine  6 mg Oral TID  . traZODone  225-300 mg Oral QHS    Past Medical History:  Diagnosis Date  . Allergy   . Anemia   . Anxiety   . Arthritis   . Asthma   . Carpal tunnel syndrome  bilateral  . Complication of anesthesia    in the past has had N/V, the last few surgeries she has not been  . COPD (chronic obstructive pulmonary disease) ([place])   . Easy bruising 07/10/2016  . GERD (gastroesophageal reflux disease)   . Headache   . Heart murmur 2005   never had any problems  . History of blood transfusion 2018  . History of kidney stones   . Hypertension   . Hypoglycemia   . Hypoglycemia   . Iron deficiency anemia 07/11/2016  . Neuromuscular disorder ([place])    pt denies  . Neuropathy    both arms  . Normocytic anemia 07/29/2015  . Pneumonia   . PONV (postoperative nausea and vomiting)   . Sciatica   .  Seizures ([place])    as a child when she would have an asthma attack. none as an adult  . Shortness of breath dyspnea     Past Surgical History:  Procedure Laterality Date  . ABDOMINAL HYSTERECTOMY  2005  . APPLICATION OF ROBOTIC ASSISTANCE FOR SPINAL PROCEDURE N/A 09/05/2016   Procedure: APPLICATION OF ROBOTIC ASSISTANCE FOR SPINAL PROCEDURE;  Surgeon: Consuella Lose, MD;  Location: [facility];  Service: Neurosurgery;  Laterality: N/A;  . CERVICAL FUSION    . [place]  . COLONOSCOPY    . cyst removal face    . disk repair     neck and lumbar region  . FOOT SURGERY Bilateral    to file bones down   . HARDWARE REMOVAL N/A 09/05/2016   Procedure: HARDWARE REMOVAL AND REPLACEMENT OF LUMBAR FIVE SCREWS;  Surgeon: Consuella Lose, MD;  Location: [facility];  Service: Neurosurgery;  Laterality: N/A;  . IMPLANTATION / PLACEMENT EPIDURAL NEUROSTIMULATOR ELECTRODES    . LAMINECTOMY     2006  . LUMBAR FUSION    . SPINAL CORD STIMULATOR BATTERY EXCHANGE    . SPINAL CORD STIMULATOR INSERTION N/A 07/05/2016   Procedure: REPLACEMENT OF LUMBAR SPINAL CORD STIMULATOR BATTERY;  Surgeon: Newman Pies, MD;  Location: [facility];  Service: Neurosurgery;  Laterality: N/A;  . TONSILLECTOMY       NUTRITION - FOCUSED PHYSICAL EXAM:    Most Recent Value  Orbital Region  Moderate depletion  Upper Arm Region  Severe depletion  Thoracic and Lumbar Region  Moderate depletion  Buccal Region  Mild depletion  Temple Region  Mild depletion  Clavicle Bone Region  Moderate depletion  Clavicle and Acromion Bone Region  Moderate depletion  Scapular Bone Region  Mild depletion  Dorsal Hand  Moderate depletion  Patellar Region  Moderate depletion  Anterior Thigh Region  Moderate depletion  Edema (RD Assessment)  None  Hair  Reviewed  Eyes  Reviewed  Mouth  Reviewed  Skin  Reviewed  Nails  Reviewed     Diet Order:  Diet regular Room service appropriate? Yes; Fluid consistency: Thin  EDUCATION NEEDS:    Education needs have been addressed Skin:  Skin Assessment: Reviewed RN Assessment  Last BM:  unknown  Height:   Ht Readings from Last 1 Encounters:  06/30/17 5\' 3"  (1.6 m)    Weight:   Wt Readings from Last 1 Encounters:  07/02/17 106 lb 0.7 oz (48.1 kg)    Ideal Body Weight:  52.2 kg  BMI:  Body mass index is 18.78 kg/m.  Estimated Nutritional Needs:   Kcal:  1500-1700  Protein:  62-70 gr  Fluid:  1.4-1.6 liters daily   Colman Cater MS,RD,CSG,LDN Office: [phone] Pager: #  [phone]  

## 2017-07-02 NOTE — Evaluation (Signed)
Physical Therapy Evaluation Patient Details Name: Jasmine [name] MRN: [id number] DOB: 07/26/1955 Today's Date: 07/02/2017   History of Present Illness  Jasmine [name] is a 62yo white female who [facility] on [facility] with 3-4weeks of SOB, found to be hypoxic in ED. PMH: COPD, GERD, neuropathy, Rt Foot drop, HTN, chronic low back pain. For th epast year patient has AMB only AMB outside of the home for medical appointments. Pt uses a 4ww for room to room AMB with chronic breathlessness. Pt's husband assists with bathing, dressing, and toiletting, transport and meals.    Clinical Impression  Pt admitted with above diagnosis. Pt currently with functional limitations due to the deficits listed below (see "PT Problem List"). Upon entry, the patient is received recumbent in bed. The pt is awake and agreeable to participate. Pt c/o continued breathlessness. The pt is alert and oriented x3, pleasant, conversational, and following simple and multi-step commands consistently. Pt received on 2.5LPM O2 c SpO2: 94% resting, drops to 89% with basic mobility and slow AMB or short distance. All basic mobility performed at supervision level assistance, PT helping with lines and lead in ICU. AMB tolerance is limited to 20-30 feet per bout, which requires 3 minutes recovery. Increase in breathlessness is markedly greater than decrease in SpO2. No mobility is trialed on room air due to high levels of anxiety, breathlessness, and responsive tachycardia. HR elevates significantly with activity from 100s to 120s BPM. Pt will benefit from skilled PT intervention to increase independence and safety with basic mobility in preparation for discharge to the venue listed below.    Patient demonstrates severe breathing difficulty and dyspnea on exertion which impairs their ability to perform daily activities like AMB to bathroom in the home.  A walker alone will not resolve the issues with performing activities of daily living. A  wheelchair will allow patient to safely perform daily activities.  The patient can self propel in the home or has a caregiver who can provide assistance.         Follow Up Recommendations Home health PT;Supervision for mobility/OOB    Equipment Recommendations  Wheelchair (measurements PT);Wheelchair cushion (measurements PT);Other (comment)(Anti-tipper devices; elevated foot rests (pt has foot drop) )    Recommendations for Other Services       Precautions / Restrictions Precautions Precautions: None Precaution Comments: patient reports that she has been unable to stand up due to her SOB.  Restrictions Weight Bearing Restrictions: No      Mobility  Bed Mobility Overal bed mobility: Modified Independent             General bed mobility comments: recumbent to EOB  Transfers Overall transfer level: Modified independent Equipment used: 4-wheeled walker             General transfer comment: performed 2x in session  Ambulation/Gait Ambulation/Gait assistance: Supervision Ambulation Distance (Feet): 30 Feet Assistive device: Rolling walker (2 wheeled)       General Gait Details: first AMB 53ft stopping q35ft to rest; requires 3 minutes recovery d/t increased breathlessness (also tachycardia), then AMB 67ft similarly as before.   Stairs            Wheelchair Mobility    Modified Rankin (Stroke Patients Only)       Balance Overall balance assessment: Modified Independent;History of Falls(1 fall last week, description sounds syncopal but patient is not sure she passed out)  Pertinent Vitals/Pain Pain Assessment: No/denies pain    Home Living Family/patient expects to be discharged to:: Private residence Living Arrangements: Spouse/significant other;Other relatives(husband provides care for patient and the patient's mother who is disabled)) Available Help at Discharge: Family;Available 24  hours/day Type of Home: House Home Access: Stairs to enter Entrance Stairs-Rails: Left Entrance Stairs-Number of Steps: 3 Home Layout: One level Home Equipment: Walker - 4 wheels;Cane - single point;Bedside commode;Shower seat;Walker - 2 wheels Additional Comments: Rt AFO for drop foot, she uses for longer distance AMB out of home.     Prior Function Level of Independence: Needs assistance   Gait / Transfers Assistance Needed: patient reports minimal ambulation at home.   ADL's / Homemaking Assistance Needed: Husband assists with meal prep, showers, dressing, and toileting.   Comments: Patient does not have a BIPAP or home oxygen. At one time, she did have a rented wheelchair.      Hand Dominance   Dominant Hand: Right    Extremity/Trunk Assessment   Upper Extremity Assessment Upper Extremity Assessment: Generalized weakness    Lower Extremity Assessment Lower Extremity Assessment: Defer to PT evaluation    Cervical / Trunk Assessment Cervical / Trunk Assessment: (mild thoracic kyphosis)  Communication   Communication: No difficulties  Cognition Arousal/Alertness: Awake/alert Behavior During Therapy: Anxious Overall Cognitive Status: Within Functional Limits for tasks assessed                                        General Comments      Exercises     Assessment/Plan    PT Assessment Patient needs continued PT services  PT Problem List Decreased strength;Decreased activity tolerance;Cardiopulmonary status limiting activity;Decreased mobility       PT Treatment Interventions Functional mobility training;Therapeutic activities;Therapeutic exercise;Patient/family education    PT Goals (Current goals can be found in the Care Plan section)  Acute Rehab PT Goals Patient Stated Goal: breath better, move more effortlessly PT Goal Formulation: With patient Time For Goal Achievement: 07/16/17 Potential to Achieve Goals: Fair    Frequency Min  3X/week   Barriers to discharge        Co-evaluation               AM-PAC PT "6 Clicks" Daily Activity  Outcome Measure Difficulty turning over in bed (including adjusting bedclothes, sheets and blankets)?: A Little Difficulty moving from lying on back to sitting on the side of the bed? : A Little Difficulty sitting down on and standing up from a chair with arms (e.g., wheelchair, bedside commode, etc,.)?: A Little Help needed moving to and from a bed to chair (including a wheelchair)?: A Little Help needed walking in hospital room?: A Lot Help needed climbing 3-5 steps with a railing? : Total 6 Click Score: 15    End of Session Equipment Utilized During Treatment: Oxygen Activity Tolerance: Other (comment)(increased breathlessness, tachycardia, anxiety with activity) Patient left: in bed;with call bell/phone within reach(pt refuses OOB to chair, strong preferences in positioning d/t back pain and is sleepy at this time)   PT Visit Diagnosis: Difficulty in walking, not elsewhere classified (R26.2)    Time: 1660-6301 PT Time Calculation (min) (ACUTE ONLY): 24 min   Charges:   PT Evaluation $PT Eval High Complexity: 1 High PT Treatments $Therapeutic Activity: 8-22 mins   PT G Codes:        11:15 AM, 07/24/2017 Jackquline Berlin  Dario Ave, PT, DPT Physical Therapist - [place] [phone] (Office)    Trevelle Mcgurn C 07/02/2017, 11:07 AM

## 2017-07-02 NOTE — Clinical Social Work Note (Signed)
Received CSW consult for COPD Gold protocol. Pt does not meet the CSW criteria for this protocol at this time. Will clear the consult.

## 2017-07-03 DIAGNOSIS — E440 Moderate protein-calorie malnutrition: Secondary | ICD-10-CM

## 2017-07-03 MED ORDER — HYDROCODONE BITARTRATE ER 60 MG PO T24A: 1.0000 | EXTENDED_RELEASE_TABLET | Freq: Every day | ORAL | Status: DC

## 2017-07-03 MED ORDER — HYDROCODONE BITARTRATE ER 60 MG PO T24A
60.0000 mg | EXTENDED_RELEASE_TABLET | Freq: Every day | ORAL | Status: AC
  Administered 2017-07-03: 60 mg via ORAL

## 2017-07-03 NOTE — Progress Notes (Addendum)
PROGRESS NOTE    Jasmine [name]  RKY:[id number]  DOB: 30-Jul-1955  DOA: 06/30/2017 PCP: Timmothy Euler, MD   Brief Admission Hx: Jasmine [name] is a 62 y.o. female with a history of COPD, GERD, neuropathy, chronic pain on chronic narcotics, hypertension.  Patient presents with worsening shortness of breath over the past 3 to 4 weeks.  She presented to ED with acute respiratory distress with hypoxia and subsequently started on bipap.   MDM/Assessment & Plan:   1. Acute respiratory failure with hypoxia-patient says that she responded very well to nightly BiPAP.  She was able to sleep for the first time in a very long time.  I suspect she likely has some undiagnosed sleep apnea and should be tested at some point for OSA.  She would need to do this through her primary care physician after she is discharged.  Continue current treatments.  She is not ready for discharge home as she can only ambulate very short distances without severe SOB.  Continue care in med-surg.  2. COPD with acute exacerbation- continue current treatments with IV steroids, antibiotics and continuous around-the-clock neb treatments. 3. Essential Hypertension-stable. 4. Hypokalemia-replacement ordered.  Check magnesium. 5. Chronic pain with opioid dependence-resume home pain med management regimen which has been stable per patient.  DVT prophylaxis: lovenox Code Status: Full  Family Communication:  Disposition Plan: ongoing inpatient medical treatment, hopeful to discharge home tomorrow 5/1  Subjective: Pt still short of breath with short distances including to bathroom.       Objective: Vitals:   07/03/17 0006 07/03/17 0424 07/03/17 0756 07/03/17 0758  BP:  (!) 163/113    Pulse: 79 (!) 113    Resp:      Temp:  98.1 F (36.7 C)    TempSrc:  Oral    SpO2: 93% 97% 91% 91%  Weight:      Height:        Intake/Output Summary (Last 24 hours) at 07/03/2017 1023 Last data filed at 07/03/2017 0900 Gross per 24  hour  Intake 480 ml  Output 4450 ml  Net -3970 ml   Filed Weights   06/30/17 2122 07/01/17 0500 07/02/17 0412  Weight: 47.2 kg (104 lb 0.9 oz) 47.2 kg (104 lb 0.9 oz) 48.1 kg (106 lb 0.7 oz)   REVIEW OF SYSTEMS  As per history otherwise all reviewed and reported negative  Exam:  General exam: Awake, alert, no distress, cooperative and pleasant Respiratory system:  fairly clear with occasional expiratory wheezes heard. No increased work of breathing.  Cardiovascular system: S1 & S2 heard, RRR. No JVD, murmurs, gallops, clicks or pedal edema. Gastrointestinal system: Abdomen is nondistended, soft and nontender. Normal bowel sounds heard. Central nervous system: Alert and oriented. No focal neurological deficits. Extremities: no CCE.  Data Reviewed: Basic Metabolic Panel: Recent Labs  Lab 06/30/17 1508 07/01/17 0507  NA 136 144  K 2.4* 3.7  CL 100* 109  CO2 16* 18*  GLUCOSE 77 142*  BUN 11 11  CREATININE 0.76 0.68  CALCIUM 9.3 8.8*  MG 1.7  --    Liver Function Tests: No results for input(s): AST, ALT, ALKPHOS, BILITOT, PROT, ALBUMIN in the last 168 hours. No results for input(s): LIPASE, AMYLASE in the last 168 hours. No results for input(s): AMMONIA in the last 168 hours. CBC: Recent Labs  Lab 06/30/17 1508 07/01/17 0507  WBC 13.0* 5.7  NEUTROABS 10.3*  --   HGB 12.0 11.3*  HCT 37.9 36.9  MCV  102.2* 104.8*  PLT 229 238   Cardiac Enzymes: No results for input(s): CKTOTAL, CKMB, CKMBINDEX, TROPONINI in the last 168 hours. CBG (last 3)  No results for input(s): GLUCAP in the last 72 hours. Recent Results (from the past 240 hour(s))  MRSA PCR Screening     Status: None   Collection Time: 06/30/17 10:20 PM  Result Value Ref Range Status   MRSA by PCR NEGATIVE NEGATIVE Final    Comment:        The GeneXpert MRSA Assay (FDA approved for NASAL specimens only), is one component of a comprehensive MRSA colonization surveillance program. It is not intended to  diagnose MRSA infection nor to guide or monitor treatment for MRSA infections. Performed at [facility], [address], [place], [place] [address]   Respiratory Panel by PCR     Status: None   Collection Time: 07/01/17  6:00 AM  Result Value Ref Range Status   Adenovirus NOT DETECTED NOT DETECTED Final   Coronavirus 229E NOT DETECTED NOT DETECTED Final   Coronavirus HKU1 NOT DETECTED NOT DETECTED Final   Coronavirus NL63 NOT DETECTED NOT DETECTED Final   Coronavirus OC43 NOT DETECTED NOT DETECTED Final   Metapneumovirus NOT DETECTED NOT DETECTED Final   Rhinovirus / Enterovirus NOT DETECTED NOT DETECTED Final   Influenza A NOT DETECTED NOT DETECTED Final   Influenza B NOT DETECTED NOT DETECTED Final   Parainfluenza Virus 1 NOT DETECTED NOT DETECTED Final   Parainfluenza Virus 2 NOT DETECTED NOT DETECTED Final   Parainfluenza Virus 3 NOT DETECTED NOT DETECTED Final   Parainfluenza Virus 4 NOT DETECTED NOT DETECTED Final   Respiratory Syncytial Virus NOT DETECTED NOT DETECTED Final   Bordetella pertussis NOT DETECTED NOT DETECTED Final   Chlamydophila pneumoniae NOT DETECTED NOT DETECTED Final   Mycoplasma pneumoniae NOT DETECTED NOT DETECTED Final    Comment: Performed at [facility], [place] [address] [place], [place] [address]     Studies: No results found. Scheduled Meds: . amLODipine  10 mg Oral Daily  . chlorhexidine  15 mL Mouth Rinse BID  . DULoxetine  30 mg Oral TID  . enoxaparin (LOVENOX) injection  40 mg Subcutaneous Q24H  . feeding supplement (ENSURE ENLIVE)  237 mL Oral BID BM  . guaiFENesin  600 mg Oral BID  . HYDROcodone Bitartrate ER  1 tablet Oral Daily  . HYDROcodone-acetaminophen  1 tablet Oral Q6H  . ipratropium-albuterol  3 mL Nebulization QID  . levofloxacin  500 mg Oral Daily  . loratadine  10 mg Oral Daily  . mouth rinse  15 mL Mouth Rinse q12n4p  . methylPREDNISolone (SOLU-MEDROL) injection  60 mg Intravenous Q8H  . mometasone-formoterol  2  puff Inhalation BID  . multivitamin with minerals  1 tablet Oral Daily  . nicotine  14 mg Transdermal Daily  . pantoprazole  40 mg Oral Daily  . potassium chloride  40 mEq Oral TID  . tiZANidine  6 mg Oral TID  . traZODone  225-300 mg Oral QHS   Continuous Infusions:   Principal Problem:   Acute respiratory failure with hypoxia (HCC) Active Problems:   Chronic back pain   HTN (hypertension)   Chronic narcotic use   Hypokalemia   COPD with acute exacerbation (HCC)   Malnutrition of moderate degree  Time spent: 24 mins  Irwin Brakeman, MD, FAAFP [facility] Pager [phone]  If 7PM-7AM, please contact night-coverage [email or web address] Password [facility] 07/03/2017, 10:23 AM    LOS:  3 days

## 2017-07-03 NOTE — Progress Notes (Signed)
Physical Therapy Treatment Patient Details Name: Jasmine [name] MRN: [id number] DOB: [birth year] Today's Date: 07/03/2017    History of Present Illness Jasmine [name] is a 62yo white female who comes to [facility] on 8/27 with 3-4weeks of SOB, found to be hypoxic in ED. PMH: COPD, GERD, neuropathy, Rt Foot drop, HTN, chronic low back pain. For th epast year patient has AMB only AMB outside of the home for medical appointments. Pt uses a 4ww for room to room AMB with chronic breathlessness. Pt's husband assists with bathing, dressing, and toiletting, transport and meals.      PT Comments    Patient demonstrates increased tolerance/endurance for gait training using rollator without loss of balance or requiring a sit down break while on room air with O2 saturation between 90-92% - RN notified.  Patient limited secondary to c/o SOB with exertion and had to lie down after gait training.  Patient will benefit from continued physical therapy in hospital and recommended venue below to increase strength, balance, endurance for safe ADLs and gait.   Patient suffers from COPD with SOB dyspnea which impairs her ability to perform daily activities like prolonged standing, walking, and completing routine activities in the home.  A walker, rollator or standard wheelchair alone will not resolve the issues with performing activities of daily living. A transport wheelchair will allow patient to safely perform daily activities and go to appointments with her caregiver assisting her secondary to patient becomes SOB with exertion and unable to propel herself.   Follow Up Recommendations  Home health PT;Supervision for mobility/OOB     Equipment Recommendations  Wheelchair (measurements PT);Wheelchair cushion (measurements PT);Other (comment)(need transport wheelchair)    Recommendations for Other Services       Precautions / Restrictions Precautions Precautions: None Restrictions Weight Bearing Restrictions: No     Mobility  Bed Mobility Overal bed mobility: Modified Independent                Transfers Overall transfer level: Modified independent Equipment used: 4-wheeled walker                Ambulation/Gait Ambulation/Gait assistance: Supervision Ambulation Distance (Feet): 150 Feet Assistive device: 4-wheeled walker Gait Pattern/deviations: Decreased step length - right;Decreased step length - left;Decreased stride length     General Gait Details: demonstrates increased tolerance for gait with slow slightly labored cadence, no loss of balance of sitting rest breaks using rollator, on room air throughout with O2 saturations between 90-92%   Stairs             Wheelchair Mobility    Modified Rankin (Stroke Patients Only)       Balance Overall balance assessment: Mild deficits observed, not formally tested                                          Cognition Arousal/Alertness: Awake/alert Behavior During Therapy: WFL for tasks assessed/performed Overall Cognitive Status: Within Functional Limits for tasks assessed                                        Exercises      General Comments        Pertinent Vitals/Pain Pain Assessment: No/denies pain    Home Living  Prior Function            PT Goals (current goals can now be found in the care plan section) Acute Rehab PT Goals Patient Stated Goal: breath better, move more effortlessly PT Goal Formulation: With patient Time For Goal Achievement: 07/16/17 Potential to Achieve Goals: Good Progress towards PT goals: Progressing toward goals    Frequency    Min 3X/week      PT Plan Current plan remains appropriate    Co-evaluation              AM-PAC PT "6 Clicks" Daily Activity  Outcome Measure  Difficulty turning over in bed (including adjusting bedclothes, sheets and blankets)?: None Difficulty moving from lying on  back to sitting on the side of the bed? : None Difficulty sitting down on and standing up from a chair with arms (e.g., wheelchair, bedside commode, etc,.)?: None Help needed moving to and from a bed to chair (including a wheelchair)?: None Help needed walking in hospital room?: A Little Help needed climbing 3-5 steps with a railing? : A Little 6 Click Score: 22    End of Session   Activity Tolerance: Patient tolerated treatment well(Patient limited by mild SOB) Patient left: with call bell/phone within reach;with family/visitor present;in bed Nurse Communication: Mobility status PT Visit Diagnosis: Unsteadiness on feet (R26.81);Other abnormalities of gait and mobility (R26.89);Muscle weakness (generalized) (M62.81)     Time: 3785-8850 PT Time Calculation (min) (ACUTE ONLY): 29 min  Charges:  $Gait Training: 8-22 mins $Therapeutic Exercise: 8-22 mins                    G Codes:       2:41 PM, 2017/08/02 Lonell Grandchild, MPT Physical Therapist with [facility] 336 [phone] office [phone] mobile phone

## 2017-07-04 ENCOUNTER — Encounter: Payer: Self-pay | Admitting: Family Medicine

## 2017-07-04 DIAGNOSIS — J9601 Acute respiratory failure with hypoxia: Secondary | ICD-10-CM

## 2017-07-04 MED ORDER — NICOTINE 14 MG/24HR TD PT24: 14.0000 mg | MEDICATED_PATCH | Freq: Every day | TRANSDERMAL | 0 refills | Status: DC

## 2017-07-04 MED ORDER — ENSURE ENLIVE PO LIQD: 237.0000 mL | Freq: Two times a day (BID) | ORAL | 12 refills | Status: DC

## 2017-07-04 MED ORDER — POTASSIUM CHLORIDE CRYS ER 20 MEQ PO TBCR: 40.0000 meq | EXTENDED_RELEASE_TABLET | Freq: Three times a day (TID) | ORAL | 0 refills | Status: DC

## 2017-07-04 NOTE — Care Management Note (Signed)
Case Management Note  Patient Details  Name: Jasmine [name] MRN: [id number] Date of Birth: October 27, 1955  Expected Discharge Date:  07/04/17               Expected Discharge Plan:  [place]  In-House Referral:     Discharge planning Services  CM Consult  Post Acute Care Choice:  Home Health, Durable Medical Equipment Choice offered to:  Patient  DME Arranged:  Wheelchair manual, Oxygen DME Agency:  [facility] Arranged:  PT, OT, RN [facility] Agency:  [place]  Status of Service:  Completed, signed off  If discussed at [place] of Stay Meetings, dates discussed:    Additional Comments: DC home today. Meets criteria for home O2. Oxygen and WC will be delivered to room prior to DC. Pt aware HH has 48 hrs to make first visit. Juliann Pulse, [facility] rep, will pull pt info from chart. Husband at bedside for DC plan confirmation.    Sherald Barge, RN 07/04/2017, 2:34 PM

## 2017-07-04 NOTE — Progress Notes (Signed)
SATURATION QUALIFICATIONS: (This note is used to comply with regulatory documentation for home oxygen)  Patient Saturations on Room Air at Rest = 95%  Patient Saturations on Room Air while Ambulating = 88%  Patient Saturations on 2Liters of oxygen while Ambulating = 94%

## 2017-07-04 NOTE — Care Management Important Message (Signed)
Important Message  Patient Details  Name: Jasmine [name] MRN: [id number] Date of Birth: 07/09/1955   Medicare Important Message Given:  Yes    Sherald Barge, RN 07/04/2017, 2:34 PM

## 2017-07-09 ENCOUNTER — Encounter: Payer: Self-pay | Admitting: Family Medicine

## 2017-07-09 ENCOUNTER — Ambulatory Visit (INDEPENDENT_AMBULATORY_CARE_PROVIDER_SITE_OTHER): Admitting: Family Medicine

## 2017-07-09 VITALS — BP 119/71 | HR 86 | Temp 97.4°F | Ht 63.0 in | Wt 106.0 lb

## 2017-07-09 DIAGNOSIS — E440 Moderate protein-calorie malnutrition: Secondary | ICD-10-CM | POA: Diagnosis not present

## 2017-07-09 DIAGNOSIS — Z720 Tobacco use: Secondary | ICD-10-CM

## 2017-07-09 DIAGNOSIS — K760 Fatty (change of) liver, not elsewhere classified: Secondary | ICD-10-CM | POA: Insufficient documentation

## 2017-07-09 DIAGNOSIS — I700 Atherosclerosis of aorta: Secondary | ICD-10-CM | POA: Insufficient documentation

## 2017-07-09 DIAGNOSIS — J432 Centrilobular emphysema: Secondary | ICD-10-CM

## 2017-07-09 DIAGNOSIS — R0902 Hypoxemia: Secondary | ICD-10-CM

## 2017-07-09 MED ORDER — NICOTINE 14 MG/24HR TD PT24: 14.0000 mg | MEDICATED_PATCH | Freq: Every day | TRANSDERMAL | 0 refills | Status: DC

## 2017-07-09 MED ORDER — NICOTINE 21 MG/24HR TD PT24: 21.0000 mg | MEDICATED_PATCH | Freq: Every day | TRANSDERMAL | 0 refills | Status: DC

## 2017-07-09 MED ORDER — NICOTINE 7 MG/24HR TD PT24: 7.0000 mg | MEDICATED_PATCH | Freq: Every day | TRANSDERMAL | 0 refills | Status: DC

## 2017-07-09 NOTE — Progress Notes (Signed)
Subjective: CC: Hospital follow up PCP: Timmothy Euler, MD OVF:IEPPIR Jasmine [name] is a 62 y.o. female presenting to clinic today for:  1. Hospital follow up Patient was hospitalized for COPD exacerbation on 06/30/2017.  During that hospitalization, she was admitted to the ICU and placed on BiPAP.  CT chest was negative for pulmonary embolism or pneumonia.  However, advanced centrilobular emphysema was appreciated as well as aortic atherosclerosis.  Hepatic steatosis also noted.  She was treated with IV steroids and antibiotics.  There is concern for obstructive sleep apnea and an outpatient sleep study was recommended.  She was discharged from hospital on 07/04/2017.  She did meet requirements for home oxygen and was discharged with ambulatory O2.  Since discharge from hospital patient reports that her breathing has been much better.  She has been using 2 L of oxygen via nasal cannula if she desats below 90% or she becomes very winded with exertion.  She is also been sleeping with the 2 L of oxygen at nighttime.  She is aware that she will need a sleep study.  She is nearing the completion of Levaquin and prednisone.  She does not have a pulmonologist but is interested in establishing with one.  She has home occupational therapy and physical therapy.  She is also drinking Ensure to help build muscle.  She is also totally ready to stop smoking.  She would like to have the nicotine patch prescriptions sent to the pharmacy.  Currently, she was smoking 1-1/2 to 2 packs/day and has done so for the last 45 years.  She is somewhat reluctant to consider Chantix at this time.  She is worried about side effects.  ROS: Per HPI  Allergies  Allergen Reactions  . Lyrica [Pregabalin] Other (See Comments)    EXTREME SHAKING/TREMBLING  . Darvon [Propoxyphene]     UNSPECIFIED REACTION   . Gabapentin     Extreme shaking and trembling   . Augmentin [Amoxicillin-Pot Clavulanate] Nausea And Vomiting    Has  patient had a PCN reaction causing immediate rash, facial/tongue/throat swelling, SOB or lightheadedness with hypotension:no Has patient had a PCN reaction causing severe rash involving mucus membranes or skin necrosis:No Has patient had a PCN reaction that required hospitalization:No Has patient had a PCN reaction occurring within the last 10 years:Yes--ONLY N/V If all of the above answers are "NO", then may proceed with Cephalosporin use.   . Erythromycin Itching and Rash       . Vibramycin [Doxycycline Calcium] Itching and Rash    "[place] "   Past Medical History:  Diagnosis Date  . Allergy   . Anemia   . Anxiety   . Arthritis   . Asthma   . Carpal tunnel syndrome    bilateral  . Complication of anesthesia    in the past has had N/V, the last few surgeries she has not been  . COPD (chronic obstructive pulmonary disease) ([place])   . Easy bruising 07/10/2016  . GERD (gastroesophageal reflux disease)   . Headache   . Heart murmur 2005   never had any problems  . History of blood transfusion 2018  . History of kidney stones   . Hypertension   . Hypoglycemia   . Hypoglycemia   . Iron deficiency anemia 07/11/2016  . Neuromuscular disorder ([place])    pt denies  . Neuropathy    both arms  . Normocytic anemia 07/29/2015  . Pneumonia   . PONV (postoperative nausea and vomiting)   .  Sciatica   . Seizures ([place])    as a child when she would have an asthma attack. none as an adult  . Shortness of breath dyspnea     Current Outpatient Medications:  .  amLODipine (NORVASC) 10 MG tablet, TAKE 1 TABLET DAILY, Disp: 90 tablet, Rfl: 1 .  Aspirin-Salicylamide-Caffeine (BC HEADACHE POWDER PO), Take 1 packet by mouth daily as needed (headaches). , Disp: , Rfl:  .  budesonide-formoterol (SYMBICORT) 160-4.5 MCG/ACT inhaler, USE 2 INHALATIONS TWICE A DAY, Disp: 10.2 g, Rfl: 0 .  calcium carbonate (OSCAL) 1500 (600 Ca) MG TABS tablet, Take 600 mg by mouth daily. , Disp: , Rfl:  .  cetirizine  (ZYRTEC) 10 MG tablet, Take 10 mg by mouth daily as needed for allergies. Reported on 03/12/2015, Disp: , Rfl:  .  Cholecalciferol (D-3-5) 5000 units capsule, Take 5,000 Units daily by mouth., Disp: , Rfl:  .  diclofenac sodium (VOLTAREN) 1 % GEL, Apply 1 application topically daily as needed for muscle pain., Disp: , Rfl:  .  DULoxetine (CYMBALTA) 30 MG capsule, Take 3 capsules (90 mg total) by mouth daily., Disp: 270 capsule, Rfl: 0 .  feeding supplement, ENSURE ENLIVE, (ENSURE ENLIVE) LIQD, Take 237 mLs by mouth 2 (two) times daily between meals., Disp: 237 mL, Rfl: 12 .  guaiFENesin (MUCINEX) 600 MG 12 hr tablet, Take 600 mg by mouth 2 (two) times daily. , Disp: , Rfl:  .  HYDROcodone Bitartrate ER (HYSINGLA ER) 60 MG T24A, Take 1 tablet by mouth daily., Disp: , Rfl:  .  HYDROcodone-acetaminophen (NORCO) 7.5-325 MG tablet, Take 1 tablet by mouth 4 (four) times daily., Disp: , Rfl:  .  ipratropium-albuterol (DUONEB) 0.5-2.5 (3) MG/3ML SOLN, Inhale 3 mLs into the lungs 4 (four) times daily as needed (for shortness of breath/wheezing). *May use as needed, Disp: , Rfl:  .  Iron Polysacch Cmplx-B12-FA 150-0.025-1 MG CAPS, Take 1 capsule daily by mouth., Disp: 90 each, Rfl: 1 .  nicotine (NICODERM CQ - DOSED IN MG/24 HOURS) 14 mg/24hr patch, Place 1 patch (14 mg total) onto the skin daily., Disp: 28 patch, Rfl: 0 .  Omega-3 Fatty Acids (FISH OIL) 1000 MG CAPS, Take 1 capsule by mouth daily. , Disp: , Rfl:  .  pantoprazole (PROTONIX) 40 MG tablet, TAKE 1 TABLET DAILY, Disp: 90 tablet, Rfl: 3 .  potassium chloride SA (K-DUR,KLOR-CON) 20 MEQ tablet, Take 2 tablets (40 mEq total) by mouth 3 (three) times daily for 10 days., Disp: 60 tablet, Rfl: 0 .  PROAIR HFA 108 (90 Base) MCG/ACT inhaler, USE 2 INHALATIONS EVERY 6 HOURS AS NEEDED FOR WHEEZING OR SHORTNESS OF BREATH, Disp: 25.5 g, Rfl: 1 .  tizanidine (ZANAFLEX) 6 MG capsule, Take 1 capsule 3 (three) times daily by mouth., Disp: , Rfl:  .  traZODone  (DESYREL) 150 MG tablet, Take 1.5-2 tablets (225-300 mg total) at bedtime by mouth., Disp: 180 tablet, Rfl: 3 Social History   Socioeconomic History  . Marital status: Married    Spouse name: Not on file  . Number of children: Not on file  . Years of education: Not on file  . Highest education level: Not on file  Occupational History  . Not on file  Social Needs  . Financial resource strain: Not on file  . Food insecurity:    Worry: Not on file    Inability: Not on file  . Transportation needs:    Medical: Not on file    Non-medical: Not on  file  Tobacco Use  . Smoking status: Current Every Day Smoker    Packs/day: 0.50    Years: 35.00    Pack years: 17.50    Types: Cigarettes  . Smokeless tobacco: Never Used  . Tobacco comment: trying to quit  Substance and Sexual Activity  . Alcohol use: Yes    Alcohol/week: 1.2 oz    Types: 2 Glasses of wine per week    Comment: occasionally  . Drug use: No  . Sexual activity: Not on file  Lifestyle  . Physical activity:    Days per week: Not on file    Minutes per session: Not on file  . Stress: Not on file  Relationships  . Social connections:    Talks on phone: Not on file    Gets together: Not on file    Attends religious service: Not on file    Active member of club or organization: Not on file    Attends meetings of clubs or organizations: Not on file    Relationship status: Not on file  . Intimate partner violence:    Fear of current or ex partner: Not on file    Emotionally abused: Not on file    Physically abused: Not on file    Forced sexual activity: Not on file  Other Topics Concern  . Not on file  Social History Narrative  . Not on file   Family History  Problem Relation Age of Onset  . Heart disease Mother   . Diabetes Mother   . Hypertension Mother   . Hyperlipidemia Mother   . COPD Mother   . COPD Father   . Diabetes Father   . Heart disease Father   . Cancer Father        throat  . Dementia  Father   . Asthma Son   . Hypertension Sister   . Hyperlipidemia Sister   . Hypertension Brother   . Hyperlipidemia Brother   . Hypertension Brother   . Hyperlipidemia Brother     Objective: Office vital signs reviewed. BP 119/71   Pulse 86   Temp (!) 97.4 F (36.3 C) (Oral)   Ht 5\' 3"  (1.6 m)   Wt 106 lb (48.1 kg)   SpO2 95%   BMI 18.78 kg/m   Physical Examination:  General: Awake, alert, thin female. No acute distress Cardio: regular rate and rhythm, S1S2 heard, no murmurs appreciated Pulm: Globally decreased breath sounds. no wheezes, rhonchi or rales; normal work of breathing on room air MSK: Moves all extremities.  Patient is thin.  Muscle atrophy noted.   Assessment/ Plan: 62 y.o. female   1. Centrilobular emphysema ([place]) CT results were reviewed with the patient.  Will place referral to pulmonology and sleep medicine.  For now, continue ambulatory O2 and home inhalation regimen.  Follow-up as needed. - Ambulatory referral to Pulmonology - Ambulatory referral to Sleep Studies  2. Aortic atherosclerosis ([place]) Noted on CT chest.  I discussed this result with the patient.  She would like to hold off on her lipid profile at her annual physical exam before starting any lipid-lowering medications.  3. Hepatic steatosis Possibly hereditary.  Patient is not obese or overweight.  She does not consume excessive amounts of alcohol.  I offered her referral to gastroenterology/hepatology but patient declined.  She would like to work on pulmonary issues for now.  4. Hypoxia Currently has home O2.  Will place referral to sleep studies and pulmonology as above. -  Ambulatory referral to Sleep Studies  5. Tobacco abuse Action phase of smoking cessation.  She has a greater than 50-year pack history.  CT chest did not demonstrate any malignancy within the lungs.  Emphysema as above.  31-month taper of nicotine patch as prescribed.  We discussed Chantix as well but patient declined  this for now.  6. Malnutrition of moderate degree Patient weighed 117 pounds in February.  Her last office visit with me she was down to 103.  She is up to 106 today.  Continue Ensure, physical and Occupational Therapy.  We will continue to monitor closely.   Orders Placed This Encounter  Procedures  . Ambulatory referral to Pulmonology    Referral Priority:   Routine    Referral Type:   Consultation    Referral Reason:   Specialty Services Required    Requested Specialty:   Pulmonary Disease    Number of Visits Requested:   1  . Ambulatory referral to Sleep Studies    Referral Priority:   Routine    Referral Type:   Consultation    Referral Reason:   Specialty Services Required    Number of Visits Requested:   1   Meds ordered this encounter  Medications  . nicotine (NICODERM CQ - DOSED IN MG/24 HOURS) 21 mg/24hr patch    Sig: Place 1 patch (21 mg total) onto the skin daily.    Dispense:  84 patch    Refill:  0  . nicotine (NICODERM CQ - DOSED IN MG/24 HOURS) 14 mg/24hr patch    Sig: Place 1 patch (14 mg total) onto the skin daily.    Dispense:  28 patch    Refill:  0  . nicotine (NICODERM CQ - DOSED IN MG/24 HR) 7 mg/24hr patch    Sig: Place 1 patch (7 mg total) onto the skin daily.    Dispense:  28 patch    Refill:  [place], [place] [phone]

## 2017-07-09 NOTE — Discharge Summary (Signed)
Physician Discharge Summary  Jasmine [name] VVZ:[id number] DOB: 1955/11/04 DOA: 06/30/2017  PCP: Timmothy Euler, MD  Admit date: 06/30/2017 Discharge date: 07/09/2017  Time spent: 25 minutes  Recommendations for Outpatient Follow-up:  1. pls refer to sleep medicine for osa testing/epworht/stopbang 2. pls continue cessation counseling for Tob 3. Needs cxr 1 mo 4. Consider Megace/chantix? 5. Recheck LFT and Magnesium in 1-2 weeks---has elevated LFT unknown sig [?CVC liver]  Discharge Diagnoses:  Principal Problem:   Acute respiratory failure with hypoxia (HCC) Active Problems:   Chronic back pain   HTN (hypertension)   Chronic narcotic use   Hypokalemia   COPD with acute exacerbation (HCC)   Malnutrition of moderate degree   Discharge Condition: please refert  Diet recommendation: eat anything to gain weight for now  [facility] Weights   06/30/17 2122 07/01/17 0500 07/02/17 0412  Weight: 47.2 kg (104 lb 0.9 oz) 47.2 kg (104 lb 0.9 oz) 48.1 kg (106 lb 0.7 oz)    History of present illness:  Please see note from Dr. Paulene Floor "62 y.o.femalewith a history of COPD, GERD, neuropathy, chronic pain on chronic narcotics, hypertension. Patient presents with worsening shortness of breathover the past 3 to 4 weeks.  She presented to ED with acute respiratory distress with hypoxia and subsequently started on bipap"    Hospital Course:  1. Acute respiratory failure with hypoxia-patient says that she responded very well to nightly BiPAP.  She was able to sleep for the first time in a very long time. ?OSA-referrinf to Sleep as OP.  Needs screening fro pulm htn as well? Could benefit from Cardio-pulm rehab if avail  [place] 2. COPD with acute exacerbation- continue current treatments with IV steroids, antibiotics and continuous around-the-clock neb treatments-narrowed and tapered on d/c home--5 day steroid taper 3. Essential Hypertension-stable. 4. Hypokalemia-replacement  ordered.  recheck mag as op 5. Chronic pain with opioid dependence-resume home pain med management regimen which has been stable per patient.    Discharge Exam: Vitals:   07/04/17 1423 07/04/17 1600  BP: (!) 122/93   Pulse: (!) 101   Resp: 18   Temp: 98.6 F (37 C)   SpO2: 96% 93%    General: awake alert frail Cardiovascular: s1 s2 no m/r/g/ Respiratory: no wheeze, no rales  Discharge Instructions   Discharge Instructions    Diet - low sodium heart healthy   Complete by:  As directed    Discharge instructions   Complete by:  As directed    You will probably need oxygen going forward Please see Dr. Luan Pulling for follow up We will add low dose steroids for 5 days for completion and   Increase activity slowly   Complete by:  As directed      Allergies as of 07/04/2017      Reactions   Lyrica [pregabalin] Other (See Comments)   EXTREME SHAKING/TREMBLING   Darvon [propoxyphene]    UNSPECIFIED REACTION    Gabapentin    Extreme shaking and trembling    Augmentin [amoxicillin-pot Clavulanate] Nausea And Vomiting   Has patient had a PCN reaction causing immediate rash, facial/tongue/throat swelling, SOB or lightheadedness with hypotension:no Has patient had a PCN reaction causing severe rash involving mucus membranes or skin necrosis:No Has patient had a PCN reaction that required hospitalization:No Has patient had a PCN reaction occurring within the last 10 years:Yes--ONLY N/V If all of the above answers are "NO", then may proceed with Cephalosporin use.   Erythromycin Itching, Rash  Vibramycin [doxycycline Calcium] Itching, Rash   "SPLOTCHING "      Medication List    STOP taking these medications   SPIRIVA HANDIHALER 18 MCG inhalation capsule Generic drug:  tiotropium     TAKE these medications   amLODipine 10 MG tablet Commonly known as:  NORVASC TAKE 1 TABLET DAILY   BC HEADACHE POWDER PO Take 1 packet by mouth daily as needed (headaches).    budesonide-formoterol 160-4.5 MCG/ACT inhaler Commonly known as:  SYMBICORT USE 2 INHALATIONS TWICE A DAY   calcium carbonate 1500 (600 Ca) MG Tabs tablet Commonly known as:  OSCAL Take 600 mg by mouth daily.   cetirizine 10 MG tablet Commonly known as:  ZYRTEC Take 10 mg by mouth daily as needed for allergies. Reported on 03/12/2015   D-3-5 5000 units capsule Generic drug:  Cholecalciferol Take 5,000 Units daily by mouth.   diclofenac sodium 1 % Gel Commonly known as:  VOLTAREN Apply 1 application topically daily as needed for muscle pain.   DULoxetine 30 MG capsule Commonly known as:  CYMBALTA Take 3 capsules (90 mg total) by mouth daily.   feeding supplement (ENSURE ENLIVE) Liqd Take 237 mLs by mouth 2 (two) times daily between meals.   Fish Oil 1000 MG Caps Take 1 capsule by mouth daily.   guaiFENesin 600 MG 12 hr tablet Commonly known as:  MUCINEX Take 600 mg by mouth 2 (two) times daily.   HYDROcodone-acetaminophen 7.5-325 MG tablet Commonly known as:  NORCO Take 1 tablet by mouth 4 (four) times daily.   HYSINGLA ER 60 MG T24a Generic drug:  HYDROcodone Bitartrate ER Take 1 tablet by mouth daily.   ipratropium-albuterol 0.5-2.5 (3) MG/3ML Soln Commonly known as:  DUONEB Inhale 3 mLs into the lungs 4 (four) times daily as needed (for shortness of breath/wheezing). *May use as needed   Iron Polysacch Cmplx-B12-FA 150-0.025-1 MG Caps Take 1 capsule daily by mouth.   pantoprazole 40 MG tablet Commonly known as:  PROTONIX TAKE 1 TABLET DAILY   potassium chloride SA 20 MEQ tablet Commonly known as:  K-DUR,KLOR-CON Take 2 tablets (40 mEq total) by mouth 3 (three) times daily for 10 days.   PROAIR HFA 108 (90 Base) MCG/ACT inhaler Generic drug:  albuterol USE 2 INHALATIONS EVERY 6 HOURS AS NEEDED FOR WHEEZING OR SHORTNESS OF BREATH   tizanidine 6 MG capsule Commonly known as:  ZANAFLEX Take 1 capsule 3 (three) times daily by mouth.   traZODone 150 MG  tablet Commonly known as:  DESYREL Take 1.5-2 tablets (225-300 mg total) at bedtime by mouth.     ASK your doctor about these medications   levofloxacin 500 MG tablet Commonly known as:  LEVAQUIN Take 1 tablet (500 mg total) by mouth daily for 7 days. Ask about: Should I take this medication?   predniSONE 20 MG tablet Commonly known as:  DELTASONE Take 2 tablets (40 mg total) by mouth daily with breakfast for 5 days. Ask about: Should I take this medication?      Allergies  Allergen Reactions  . Lyrica [Pregabalin] Other (See Comments)    EXTREME SHAKING/TREMBLING  . Darvon [Propoxyphene]     UNSPECIFIED REACTION   . Gabapentin     Extreme shaking and trembling   . Augmentin [Amoxicillin-Pot Clavulanate] Nausea And Vomiting    Has patient had a PCN reaction causing immediate rash, facial/tongue/throat swelling, SOB or lightheadedness with hypotension:no Has patient had a PCN reaction causing severe rash involving mucus membranes or skin  necrosis:No Has patient had a PCN reaction that required hospitalization:No Has patient had a PCN reaction occurring within the last 10 years:Yes--ONLY N/V If all of the above answers are "NO", then may proceed with Cephalosporin use.   . Erythromycin Itching and Rash       . Vibramycin [Doxycycline Calcium] Itching and Rash    "[place] "      The results of significant diagnostics from this hospitalization (including imaging, microbiology, ancillary and laboratory) are listed below for reference.    Significant Diagnostic Studies: Dg Chest 2 View  Result Date: 06/30/2017 CLINICAL DATA:  Shortness of breath. EXAM: CHEST - 2 VIEW COMPARISON:  April 05, 2017 FINDINGS: The heart size is borderline. The hila and mediastinum are normal. Increased interstitial markings in the lungs, mildly coarsened. No overt edema. No nodule or mass. No focal infiltrate. No other interval changes. Probable tiny effusions seen on the lateral view.  IMPRESSION: 1. Probable tiny pleural effusion. 2. Coarsened interstitium could be seen with pulmonary venous congestion or atypical infection. Recommend clinical correlation. Electronically Signed   By: Dorise Bullion III M.D   On: 06/30/2017 16:15   Ct Angio Chest Pe W And/or Wo Contrast  Result Date: 06/30/2017 CLINICAL DATA:  Dyspnea x3 weeks. EXAM: CT ANGIOGRAPHY CHEST WITH CONTRAST TECHNIQUE: Multidetector CT imaging of the chest was performed using the standard protocol during bolus administration of intravenous contrast. Multiplanar CT image reconstructions and MIPs were obtained to evaluate the vascular anatomy. CONTRAST:  51mL ISOVUE-370 IOPAMIDOL (ISOVUE-370) INJECTION 76% COMPARISON:  CXR from 06/30/2017 and 04/05/2017 FINDINGS: Cardiovascular: Atherosclerosis at the origins of the great vessels with conventional branch pattern off the thoracic aorta. No thoracic aortic aneurysm. There is mild thoracic aortic atherosclerosis. No large central pulmonary embolus to the segmental level. Heart is top-normal in size without pericardial effusion. Mediastinum/Nodes: Small subcentimeter cystic nodules of the thyroid gland without worrisome features likely related to goiter. No thyromegaly. Patent trachea and mainstem bronchi. No mediastinal, hilar nor axillary lymphadenopathy. Lungs/Pleura: Advanced centrilobular emphysema of both lungs. Subpleural opacity at the right lung apex posteriorly compatible with changes of atelectasis or scarring. A pulmonary lesion is not excluded but believed less likely. Bandlike atelectasis in the right middle lobe. Subsegmental atelectasis in the lingula. Right minor fissure pleural thickening measuring 9 x 5 x 5 mm is noted versus a fissural lymph node. No effusion or pneumothorax. Upper Abdomen: . dilatation of the common bowel duct without calculus measuring up to 1.7 cm. No pancreatic ductal dilatation. There is a patent steatosis without space-occupying mass of the  included liver. 7 mm cyst in the upper pole the left kidney. Musculoskeletal: Low anterior cervical disc fusion. Neural stimulator leads project up to the midthoracic spine to approximately T6-7. Review of the MIP images confirms the above findings. IMPRESSION: 1. Over arching finding of advanced centrilobular emphysema of the lungs. Right middle and lingular atelectasis. No effusion, CHF or pneumonic consolidation. 2. No acute pulmonary embolus. 3. Probable intrafissural lymph node along the minor fissure versus focal pleural thickening measuring 9 x 5 x 5 mm. 4. Too small to further characterize subcentimeter hypodense cystic thyroid nodules bilaterally. No thyromegaly. 5. Hepatic steatosis. 6. Ectatic common bile duct without calculus. Aortic Atherosclerosis (ICD10-I70.0) and Emphysema (ICD10-J43.9). Electronically Signed   By: Ashley Royalty M.D.   On: 06/30/2017 18:29    Microbiology: Recent Results (from the past 240 hour(s))  MRSA PCR Screening     Status: None   Collection Time: 06/30/17 10:20 PM  Result Value Ref Range Status   MRSA by PCR NEGATIVE NEGATIVE Final    Comment:        The GeneXpert MRSA Assay (FDA approved for NASAL specimens only), is one component of a comprehensive MRSA colonization surveillance program. It is not intended to diagnose MRSA infection nor to guide or monitor treatment for MRSA infections. Performed at [facility], [address] [place], [place] [address]   Respiratory Panel by PCR     Status: None   Collection Time: 07/01/17  6:00 AM  Result Value Ref Range Status   Adenovirus NOT DETECTED NOT DETECTED Final   Coronavirus 229E NOT DETECTED NOT DETECTED Final   Coronavirus HKU1 NOT DETECTED NOT DETECTED Final   Coronavirus NL63 NOT DETECTED NOT DETECTED Final   Coronavirus OC43 NOT DETECTED NOT DETECTED Final   Metapneumovirus NOT DETECTED NOT DETECTED Final   Rhinovirus / Enterovirus NOT DETECTED NOT DETECTED Final   Influenza A NOT DETECTED NOT  DETECTED Final   Influenza B NOT DETECTED NOT DETECTED Final   Parainfluenza Virus 1 NOT DETECTED NOT DETECTED Final   Parainfluenza Virus 2 NOT DETECTED NOT DETECTED Final   Parainfluenza Virus 3 NOT DETECTED NOT DETECTED Final   Parainfluenza Virus 4 NOT DETECTED NOT DETECTED Final   Respiratory Syncytial Virus NOT DETECTED NOT DETECTED Final   Bordetella pertussis NOT DETECTED NOT DETECTED Final   Chlamydophila pneumoniae NOT DETECTED NOT DETECTED Final   Mycoplasma pneumoniae NOT DETECTED NOT DETECTED Final    Comment: Performed at [facility], [place] [address] [place], [place] [address]     Labs: Basic Metabolic Panel: No results for input(s): NA, K, CL, CO2, GLUCOSE, BUN, CREATININE, CALCIUM, MG, PHOS in the last 168 hours. Liver Function Tests: No results for input(s): AST, ALT, ALKPHOS, BILITOT, PROT, ALBUMIN in the last 168 hours. No results for input(s): LIPASE, AMYLASE in the last 168 hours. No results for input(s): AMMONIA in the last 168 hours. CBC: No results for input(s): WBC, NEUTROABS, HGB, HCT, MCV, PLT in the last 168 hours. Cardiac Enzymes: No results for input(s): CKTOTAL, CKMB, CKMBINDEX, TROPONINI in the last 168 hours. BNP: BNP (last 3 results) No results for input(s): BNP in the last 8760 hours.  ProBNP (last 3 results) No results for input(s): PROBNP in the last 8760 hours.  CBG: No results for input(s): GLUCAP in the last 168 hours.     Signed:  Nita Sells MD   [facility] 07/09/2017, 7:04 PM

## 2017-07-11 ENCOUNTER — Encounter: Payer: Self-pay | Admitting: Family Medicine

## 2017-07-11 ENCOUNTER — Other Ambulatory Visit: Payer: Self-pay | Admitting: Physician Assistant

## 2017-07-11 ENCOUNTER — Telehealth: Payer: Self-pay | Admitting: Family Medicine

## 2017-07-11 MED ORDER — MAGIC MOUTHWASH W/LIDOCAINE: 5.0000 mL | Freq: Three times a day (TID) | ORAL | 0 refills | Status: DC

## 2017-07-12 ENCOUNTER — Encounter: Payer: Self-pay | Admitting: Family Medicine

## 2017-07-12 MED ORDER — MAGIC MOUTHWASH W/LIDOCAINE: 5.0000 mL | Freq: Three times a day (TID) | ORAL | 0 refills | Status: DC

## 2017-07-12 NOTE — Telephone Encounter (Signed)
Called in at [facility], pt aware

## 2017-07-17 NOTE — Care Management (Signed)
Husband contacted patient relations inquiring about home care services. CM contacted pt and husband, they are in need of aid services. CM provided information on how to find agencies that provide these services.

## 2017-07-19 ENCOUNTER — Ambulatory Visit (INDEPENDENT_AMBULATORY_CARE_PROVIDER_SITE_OTHER)

## 2017-07-19 ENCOUNTER — Institutional Professional Consult (permissible substitution): Admitting: Internal Medicine

## 2017-07-19 DIAGNOSIS — M549 Dorsalgia, unspecified: Secondary | ICD-10-CM | POA: Diagnosis not present

## 2017-07-19 DIAGNOSIS — G8929 Other chronic pain: Secondary | ICD-10-CM | POA: Diagnosis not present

## 2017-07-19 DIAGNOSIS — J9601 Acute respiratory failure with hypoxia: Secondary | ICD-10-CM | POA: Diagnosis not present

## 2017-07-19 DIAGNOSIS — G529 Cranial nerve disorder, unspecified: Secondary | ICD-10-CM

## 2017-07-19 DIAGNOSIS — R2689 Other abnormalities of gait and mobility: Secondary | ICD-10-CM

## 2017-07-23 ENCOUNTER — Ambulatory Visit: Admitting: Family Medicine

## 2017-07-26 ENCOUNTER — Encounter: Payer: Self-pay | Admitting: Family Medicine

## 2017-07-26 ENCOUNTER — Ambulatory Visit (INDEPENDENT_AMBULATORY_CARE_PROVIDER_SITE_OTHER): Admitting: Family Medicine

## 2017-07-26 ENCOUNTER — Ambulatory Visit (INDEPENDENT_AMBULATORY_CARE_PROVIDER_SITE_OTHER)

## 2017-07-26 VITALS — BP 161/89 | HR 107 | Temp 98.4°F | Ht 63.0 in | Wt 105.2 lb

## 2017-07-26 DIAGNOSIS — R509 Fever, unspecified: Secondary | ICD-10-CM

## 2017-07-26 DIAGNOSIS — J441 Chronic obstructive pulmonary disease with (acute) exacerbation: Secondary | ICD-10-CM

## 2017-07-26 LAB — URINALYSIS, COMPLETE
Bilirubin, UA: NEGATIVE
Glucose, UA: NEGATIVE
LEUKOCYTES UA: NEGATIVE
Nitrite, UA: NEGATIVE
PH UA: 6 (ref 5.0–7.5)
RBC, UA: NEGATIVE
Specific Gravity, UA: 1.015 (ref 1.005–1.030)
UUROB: 1 mg/dL (ref 0.2–1.0)

## 2017-07-26 LAB — MICROSCOPIC EXAMINATION
BACTERIA UA: NONE SEEN
RBC, UA: NONE SEEN /hpf (ref 0–2)
Renal Epithel, UA: NONE SEEN /hpf
WBC UA: NONE SEEN /HPF (ref 0–5)

## 2017-07-26 MED ORDER — CEFDINIR 300 MG PO CAPS: 300.0000 mg | ORAL_CAPSULE | Freq: Two times a day (BID) | ORAL | 0 refills | Status: DC

## 2017-07-26 MED ORDER — PREDNISONE 10 MG PO TABS: ORAL_TABLET | ORAL | 0 refills | Status: DC

## 2017-07-26 NOTE — Patient Instructions (Signed)
Great to see you!   

## 2017-07-26 NOTE — Progress Notes (Signed)
   HPI  Patient presents today for hospital follow-up.  Patient was admitted for COPD exacerbation treated in the ICU with BiPAP. She states that over the last 1 to 2 weeks she is struggled with worsening cough and shortness of breath. She describes 4 to 5 days of fever last week that has now resolved. Fever was measured as high as 104 Fahrenheit.  Patient states that she is now feeling like the fevers gone, however still not breathing well.  She has a busy time coming up with pulmonology, sleep medicine, and sleep studies.  We worked through some logistics for oxygen, she is not strong enough to carry the tanks that were provided, we will request a portable oxygen concentrator  PMH: Smoking status noted ROS: Per HPI  Objective: BP (!) 161/89   Pulse (!) 107   Temp 98.4 F (36.9 C) (Oral)   Ht 5' 3" (1.6 m)   Wt 105 lb 3.2 oz (47.7 kg)   SpO2 97%   BMI 18.64 kg/m  Gen: NAD, alert, cooperative with exam HEENT: NCAT CV: RRR, good S1/S2, no murmur Resp: CTABL, no wheezes, non-labored Ext: No edema, warm Neuro: Alert and oriented, No gross deficits  Assessment and plan:  #COPD exacerbation, fever Patient with background of emphysema, COPD intermittently oxygen dependent Now with worsening shortness of breath and increasing cough. Fever causes much concern for infectious etiology, given Omnicef today. Blood culture, urinalysis, CMP and CBC pending Patient had  elevated transaminases in the hospital. Plain film pending  Patient is not strong enough to lift her current oxygen portable containers that were provided, we have requested a portable oxygen concentrator which should solve this problem.  Orders Placed This Encounter  Procedures  . Culture, blood (single)  . Culture, blood (single)  . DG Chest 2 View    Standing Status:   Future    Standing Expiration Date:   09/26/2018    Order Specific Question:   Reason for Exam (SYMPTOM  OR DIAGNOSIS REQUIRED)    Answer:    cough, fever    Order Specific Question:   Preferred imaging location?    Answer:   Internal    Order Specific Question:   Radiology Contrast Protocol - do NOT remove file path    Answer:   [email or web address]  . CBC with Differential/Platelet  . CMP14+EGFR  . Urinalysis, Complete    Meds ordered this encounter  Medications  . predniSONE (DELTASONE) 10 MG tablet    Sig: Take 4 pills a day for 3 days, then 3 pills a day for 3 days, then 2 pills a day for 3 days, then 1 pill a day for 3 days, then stop    Dispense:  30 tablet    Refill:  0  . cefdinir (OMNICEF) 300 MG capsule    Sig: Take 1 capsule (300 mg total) by mouth 2 (two) times daily. 1 po BID    Dispense:  20 capsule    Refill:  0    Sam , MD [facility] 07/26/2017, 4:24 PM     

## 2017-07-27 LAB — CMP14+EGFR
A/G RATIO: 1.3 (ref 1.2–2.2)
ALK PHOS: 102 IU/L (ref 39–117)
ALT: 17 IU/L (ref 0–32)
AST: 20 IU/L (ref 0–40)
Albumin: 3.8 g/dL (ref 3.6–4.8)
BUN / CREAT RATIO: 15 (ref 12–28)
BUN: 9 mg/dL (ref 8–27)
Bilirubin Total: 0.3 mg/dL (ref 0.0–1.2)
CO2: 23 mmol/L (ref 20–29)
CREATININE: 0.61 mg/dL (ref 0.57–1.00)
Calcium: 9.3 mg/dL (ref 8.7–10.3)
Chloride: 98 mmol/L (ref 96–106)
GFR calc Af Amer: 112 mL/min/{1.73_m2} (ref >59)
GFR calc non Af Amer: 97 mL/min/{1.73_m2} (ref >59)
GLOBULIN, TOTAL: 2.9 g/dL (ref 1.5–4.5)
GLUCOSE: 92 mg/dL (ref 65–99)
Potassium: 3.6 mmol/L (ref 3.5–5.2)
SODIUM: 140 mmol/L (ref 134–144)
TOTAL PROTEIN: 6.7 g/dL (ref 6.0–8.5)

## 2017-07-27 LAB — CBC WITH DIFFERENTIAL/PLATELET
BASOS: 0 %
Basophils Absolute: 0 10*3/uL (ref 0.0–0.2)
EOS (ABSOLUTE): 0.1 10*3/uL (ref 0.0–0.4)
Eos: 2 %
Hematocrit: 37.8 % (ref 34.0–46.6)
Hemoglobin: 12.4 g/dL (ref 11.1–15.9)
IMMATURE GRANS (ABS): 0 10*3/uL (ref 0.0–0.1)
IMMATURE GRANULOCYTES: 1 %
LYMPHS: 24 %
Lymphocytes Absolute: 1.8 10*3/uL (ref 0.7–3.1)
MCH: 32.6 pg (ref 26.6–33.0)
MCHC: 32.8 g/dL (ref 31.5–35.7)
MCV: 100 fL — AB (ref 79–97)
MONOS ABS: 0.7 10*3/uL (ref 0.1–0.9)
Monocytes: 10 %
NEUTROS PCT: 63 %
Neutrophils Absolute: 4.6 10*3/uL (ref 1.4–7.0)
PLATELETS: 351 10*3/uL (ref 150–450)
RBC: 3.8 x10E6/uL (ref 3.77–5.28)
RDW: 15.3 % (ref 12.3–15.4)
WBC: 7.2 10*3/uL (ref 3.4–10.8)

## 2017-07-28 ENCOUNTER — Encounter: Payer: Self-pay | Admitting: Family Medicine

## 2017-07-31 ENCOUNTER — Ambulatory Visit

## 2017-07-31 ENCOUNTER — Ambulatory Visit (INDEPENDENT_AMBULATORY_CARE_PROVIDER_SITE_OTHER): Admitting: Neurology

## 2017-07-31 ENCOUNTER — Encounter: Payer: Self-pay | Admitting: Neurology

## 2017-07-31 VITALS — BP 137/78 | HR 75 | Ht 63.0 in | Wt 106.0 lb

## 2017-07-31 DIAGNOSIS — R0683 Snoring: Secondary | ICD-10-CM

## 2017-07-31 DIAGNOSIS — Z9289 Personal history of other medical treatment: Secondary | ICD-10-CM

## 2017-07-31 DIAGNOSIS — Z9981 Dependence on supplemental oxygen: Secondary | ICD-10-CM | POA: Diagnosis not present

## 2017-07-31 DIAGNOSIS — J431 Panlobular emphysema: Secondary | ICD-10-CM | POA: Diagnosis not present

## 2017-07-31 DIAGNOSIS — R0902 Hypoxemia: Secondary | ICD-10-CM

## 2017-07-31 DIAGNOSIS — J449 Chronic obstructive pulmonary disease, unspecified: Secondary | ICD-10-CM | POA: Diagnosis not present

## 2017-07-31 NOTE — Progress Notes (Signed)
SLEEP MEDICINE CLINIC   Provider:  Larey Seat, M D  Primary Care Physician:  Timmothy Euler, MD  Referring Provider: Timmothy Euler, MD    Chief Complaint  Patient presents with  . New Patient (Initial Visit)    pt with husband, rm 69, pt was in hospital and placed on CPAP/Bipap due to her O2 levels dropping. she was placed on home Oxygen and dc. her PCP wanted her to get sleep study completed to see if she can get CPAP.  her O2 levels will drop into 70-80's pt husband states that she snores intermittenly in sleep. pt sees a pulmonologist for COPD in June. until now her PCP had been handling her COPD which she carries the diagnosis for 10 years.     HPI:  Jasmine [name] is a 62 y.o. female patient, seen here in a referral  from  PCP Dr.Samuel  Wendi Snipes, MD  and Dr Lajuana Ripple , DO for a follow up on a recent hospitalization.  During her recent hospitalization this 62 year old patient was observed with hypoxemia and apnea, was placed in hospital on PAP and oxygen  and it was suggested to place her on CPAP/ BiPAP for home use.   Jasmine [name] was seen here in the presence of her husband on 31 Jul 2017 had a recent hospitalization and was under the care of Dr. Adam Phenix, DO.  She was hospitalized for COPD exacerbation on 30 June 2017 and during hospital stay was admitted to the ICU and had to be placed on a BiPAP machine.  Her CT of the chest was negative for pulmonary embolism or pneumonia but there is advanced centrilobular emphysema noted as well as aortic atherosclerosis.  Hepatic steatosis was also noted.  She was treated with IV steroids and IV antibiotics and during her ICU stay she was placed on home oxygen for continued use.  She was discharged only on 04 Jul 2017.  She has been using 2 L of nasal cannula.  She has used this at nighttime as well as with activity in daytime.  She is aware that she will need a sleep study to get into the benefit of a positive airway  pressure device for home use.  She completed Levaquin and prednisone in mid May.  She is also drinking Ensure to help build up muscle, she also indicated that she is finally ready to stop smoking.  She was given nicotine patch by Dr. Lajuana Ripple as she was still smoking 2 packs/day and has done so for about 45 years.   Sleep habits are as follows: watches TV before she goes to bed, takes trazodone. 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 The patient reports that trazodone helps her to sleep within 60 minutes about after intake time. Bedtime is about midnight.  The bedroom is described as cool, quiet and dark.  [name] sleeps with head elevated 3-4 pillows. No adjustable bed yet. One nocturia, some acid reflux,frequent  coughing interrupts sleep, not reporting  RLS,  Coughing associated  with choking sensation. She sleeps between 4 and 9 hours, since she was discharged on oxygen she sleeps 6-7 hours.  She rises about 8 AM but she may be awake for hours at that time, wakes spontaneously .Marland Kitchen   Sleep medical history and family sleep history:  Sister with insomnia, insomnia for 50 years. No history of sleep walking, rare sleep talking.    Social history: married, one son  died -  long time 2 ppd smoker, for 45 years. Nicotine patch 21 mg currently used, no chantix wanted, caffeine - sweet tea , daily. No more sodas. Some BCs for headaches.  ETOH 2 glasses a week- wine cooler.     Review of Systems: Out of a complete 14 system review, the patient complains of only the following symptoms, and all other reviewed systems are negative.   urinary urge incontinence, coughing, bronchitis, phlegm, some snoring.  Cachexia of COPD, oxygen dependent., frequent vomiting. Loss of appetite, physically sick for the past 3 month- lost 17 pounds.     Epworth score 8 , Fatigue severity score 47   , depression score 3/ 15   Social History   Socioeconomic History  . Marital status:  Married    Spouse name: Not on file  . Number of children: Not on file  . Years of education: Not on file  . Highest education level: Not on file  Occupational History  . Not on file  Social Needs  . Financial resource strain: Not on file  . Food insecurity:    Worry: Not on file    Inability: Not on file  . Transportation needs:    Medical: Not on file    Non-medical: Not on file  Tobacco Use  . Smoking status: Current Every Day Smoker    Packs/day: 0.50    Years: 35.00    Pack years: 17.50    Types: Cigarettes  . Smokeless tobacco: Never Used  . Tobacco comment: trying to quit  Substance and Sexual Activity  . Alcohol use: Yes    Alcohol/week: 1.2 oz    Types: 2 Glasses of wine per week    Comment: occasionally  . Drug use: No  . Sexual activity: Not on file  Lifestyle  . Physical activity:    Days per week: Not on file    Minutes per session: Not on file  . Stress: Not on file  Relationships  . Social connections:    Talks on phone: Not on file    Gets together: Not on file    Attends religious service: Not on file    Active member of club or organization: Not on file    Attends meetings of clubs or organizations: Not on file    Relationship status: Not on file  . Intimate partner violence:    Fear of current or ex partner: Not on file    Emotionally abused: Not on file    Physically abused: Not on file    Forced sexual activity: Not on file  Other Topics Concern  . Not on file  Social History Narrative  . Not on file    Family History  Problem Relation Age of Onset  . Heart disease Mother   . Diabetes Mother   . Hypertension Mother   . Hyperlipidemia Mother   . COPD Mother   . COPD Father   . Diabetes Father   . Heart disease Father   . Cancer Father        throat  . Dementia Father   . Asthma Son   . Hypertension Sister   . Hyperlipidemia Sister   . Hypertension Brother   . Hyperlipidemia Brother   . Hypertension Brother   . Hyperlipidemia  Brother     Past Medical History:  Diagnosis Date  . Allergy   . Anemia   . Anxiety   . Arthritis   . Asthma   .  Carpal tunnel syndrome    bilateral  . Complication of anesthesia    in the past has had N/V, the last few surgeries she has not been  . COPD (chronic obstructive pulmonary disease) ([facility])   . Easy bruising 07/10/2016  . GERD (gastroesophageal reflux disease)   . Headache   . Heart murmur 2005   never had any problems  . History of blood transfusion 2018  . History of kidney stones   . Hypertension   . Hypoglycemia   . Hypoglycemia   . Iron deficiency anemia 07/11/2016  . Neuromuscular disorder ([place])    pt denies  . Neuropathy    both arms  . Normocytic anemia 07/29/2015  . Pneumonia   . PONV (postoperative nausea and vomiting)   . Sciatica   . Seizures ([place])    as a child when she would have an asthma attack. none as an adult  . Shortness of breath dyspnea     Past Surgical History:  Procedure Laterality Date  . ABDOMINAL HYSTERECTOMY  2005  . APPLICATION OF ROBOTIC ASSISTANCE FOR SPINAL PROCEDURE N/A 09/05/2016   Procedure: APPLICATION OF ROBOTIC ASSISTANCE FOR SPINAL PROCEDURE;  Surgeon: Consuella Lose, MD;  Location: [facility];  Service: Neurosurgery;  Laterality: N/A;  . CERVICAL FUSION    . [place]  . COLONOSCOPY    . cyst removal face    . disk repair     neck and lumbar region  . FOOT SURGERY Bilateral    to file bones down   . HARDWARE REMOVAL N/A 09/05/2016   Procedure: HARDWARE REMOVAL AND REPLACEMENT OF LUMBAR FIVE SCREWS;  Surgeon: Consuella Lose, MD;  Location: [facility];  Service: Neurosurgery;  Laterality: N/A;  . IMPLANTATION / PLACEMENT EPIDURAL NEUROSTIMULATOR ELECTRODES    . LAMINECTOMY     2006  . LUMBAR FUSION    . SPINAL CORD STIMULATOR BATTERY EXCHANGE    . SPINAL CORD STIMULATOR INSERTION N/A 07/05/2016   Procedure: REPLACEMENT OF LUMBAR SPINAL CORD STIMULATOR BATTERY;  Surgeon: Newman Pies, MD;  Location: [facility];   Service: Neurosurgery;  Laterality: N/A;  . TONSILLECTOMY      Current Outpatient Medications  Medication Sig Dispense Refill  . amLODipine (NORVASC) 10 MG tablet TAKE 1 TABLET DAILY 90 tablet 1  . Aspirin-Salicylamide-Caffeine (BC HEADACHE POWDER PO) Take 1 packet by mouth daily as needed (headaches).     . budesonide-formoterol (SYMBICORT) 160-4.5 MCG/ACT inhaler USE 2 INHALATIONS TWICE A DAY 10.2 g 0  . calcium carbonate (OSCAL) 1500 (600 Ca) MG TABS tablet Take 600 mg by mouth daily.     . cefdinir (OMNICEF) 300 MG capsule Take 1 capsule (300 mg total) by mouth 2 (two) times daily. 1 po BID 20 capsule 0  . cetirizine (ZYRTEC) 10 MG tablet Take 10 mg by mouth daily as needed for allergies. Reported on 03/12/2015    . Cholecalciferol (D-3-5) 5000 units capsule Take 5,000 Units daily by mouth.    . diclofenac sodium (VOLTAREN) 1 % GEL Apply 1 application topically daily as needed for muscle pain.    . DULoxetine (CYMBALTA) 30 MG capsule Take 3 capsules (90 mg total) by mouth daily. 270 capsule 0  . feeding supplement, ENSURE ENLIVE, (ENSURE ENLIVE) LIQD Take 237 mLs by mouth 2 (two) times daily between meals. 237 mL 12  . guaiFENesin (MUCINEX) 600 MG 12 hr tablet Take 600 mg by mouth 2 (two) times daily.     Marland Kitchen HYDROcodone Bitartrate ER (HYSINGLA  ER) 60 MG T24A Take 1 tablet by mouth daily.    Marland Kitchen HYDROcodone-acetaminophen (NORCO) 7.5-325 MG tablet Take 1 tablet by mouth 4 (four) times daily.    Marland Kitchen ipratropium-albuterol (DUONEB) 0.5-2.5 (3) MG/3ML SOLN Inhale 3 mLs into the lungs 4 (four) times daily as needed (for shortness of breath/wheezing). *May use as needed    . Iron Polysacch Cmplx-B12-FA 150-0.025-1 MG CAPS Take 1 capsule daily by mouth. 90 each 1  . magic mouthwash w/lidocaine SOLN Take 5 mLs by mouth 3 (three) times daily. 240 mL 0  . nicotine (NICODERM CQ - DOSED IN MG/24 HOURS) 14 mg/24hr patch Place 1 patch (14 mg total) onto the skin daily. 28 patch 0  . nicotine (NICODERM CQ - DOSED  IN MG/24 HOURS) 21 mg/24hr patch Place 1 patch (21 mg total) onto the skin daily. 84 patch 0  . nicotine (NICODERM CQ - DOSED IN MG/24 HR) 7 mg/24hr patch Place 1 patch (7 mg total) onto the skin daily. 28 patch 0  . Omega-3 Fatty Acids (FISH OIL) 1000 MG CAPS Take 1 capsule by mouth daily.     . pantoprazole (PROTONIX) 40 MG tablet TAKE 1 TABLET DAILY 90 tablet 3  . predniSONE (DELTASONE) 10 MG tablet Take 4 pills a day for 3 days, then 3 pills a day for 3 days, then 2 pills a day for 3 days, then 1 pill a day for 3 days, then stop 30 tablet 0  . PROAIR HFA 108 (90 Base) MCG/ACT inhaler USE 2 INHALATIONS EVERY 6 HOURS AS NEEDED FOR WHEEZING OR SHORTNESS OF BREATH 25.5 g 1  . tizanidine (ZANAFLEX) 6 MG capsule Take 1 capsule 3 (three) times daily by mouth.    . traZODone (DESYREL) 150 MG tablet Take 1.5-2 tablets (225-300 mg total) at bedtime by mouth. 180 tablet 3   No current facility-administered medications for this visit.     Allergies as of 07/31/2017 - Review Complete 07/31/2017  Allergen Reaction Noted  . Lyrica [pregabalin] Other (See Comments) 06/29/2016  . Darvon [propoxyphene]  02/22/2015  . Gabapentin  08/31/2016  . Augmentin [amoxicillin-pot clavulanate] Nausea And Vomiting 03/19/2015  . Erythromycin Itching and Rash 02/22/2015  . Vibramycin [doxycycline calcium] Itching and Rash 02/22/2015    Vitals: BP 137/78   Pulse 75   Ht 5\' 3"  (1.6 m)   Wt 106 lb (48.1 kg)   BMI 18.78 kg/m  Last Weight:  Wt Readings from Last 1 Encounters:  07/31/17 106 lb (48.1 kg)   DTO:IZTI mass index is 18.78 kg/m.     Last Height:   Ht Readings from Last 1 Encounters:  07/31/17 5\' 3"  (1.6 m)    Physical exam:  General: The patient is awake, alert and appears not in acute distress.  Head: Normocephalic, atraumatic. Neck is supple. Mallampati 2, pale mucosa.  neck circumference:13. 75 . Nasal airflow patent ,  Cardiovascular:  Regular rate and rhythm , without  murmurs or carotid  bruit, and without distended neck veins. Respiratory: Lungs are wheezing -  Rhonci. Skin:  Prematurely aged skin, dry, low turgor. Trunk: BMI is low . The patient's posture is stooped   Neurologic exam : The patient is awake and alert, oriented to place and time.   Attention span & concentration ability appears normal.  Speech is fluent,  with dysphonia .  Mood and affect are appropriate.  Cranial nerves: Pupils are equal and briskly reactive to light.  Extraocular movements  in vertical and horizontal planes intact  and without nystagmus. Visual fields by finger perimetry are intact. Hearing to finger rub intact.  Facial sensation intact to fine touch. Facial motor strength is symmetric and tongue and uvula move midline. Shoulder shrug was symmetrical.   Motor exam: pinched radial nerve left hand ,  Normal tone, decreased muscle bulk / mass and symmetric strength in all extremities.  Sensory: numbness on thigh , lateral dermatome to the foot- with drop foot - right side.  Status post fusion L 5 and S1.  Fine touch, pinprick and vibration were tested in all extremities. Proprioception tested in the upper extremities was normal.  Coordination: Rapid alternating movements in the fingers/hands were asymmetric . Finger-to-nose maneuver normal without evidence of ataxia, dysmetria or tremor.  Gait and station: Patient walks with a walker/ rollator as assistive device and is able unassisted to climb up to the exam table.   Deep tendon reflexes: in the  upper and lower extremities are asymmetric and not intact in the right patella and achilles tendon, drop foot since 7/ 2017. Babinski maneuver response is equivocal.    Assessment:  After physical and neurologic examination, review of laboratory studies,  Personal review of imaging studies, reports of other /same  Imaging studies, results of polysomnography and / or neurophysiology testing and pre-existing records as far as provided in visit., my  assessment is;     Cachexia.    1) Chronic back pain, joint and spine degeneration, higher fall risk, drop foot. Takes hyzingle 60 mg and narco 7.5/ 325 quid. This chronic opioid therapy  poses a high risk for central apnea and hypoventilation in a chronic COPD patient.   2) COPD, advanced with oxygen dependence. Wheezing -in a long standing active smoker. She has managed to reduce the cigarettes to 10/ 24 hours form 40/ 24.  She is now oxygen dependent in day and at night, 2 liter/ nasal canula.  Check for central, obstructive or complex sleep apnea- CPAP/ BIPAP as documented during hospital .  3) cachexia= chronic illness, with bronchitis, vomiting, anemia. .    The patient was advised of the nature of the diagnosed disorder , the treatment options and the  risks for general health and wellness arising from not treating the condition.   I spent more than 45  minutes of face to face time with the patient.  Greater than 50% of time was spent in counseling and coordination of care. We have discussed the diagnosis and differential and I answered the patient's questions.    Plan:  Treatment plan and additional workup :  Patient will came in for an attended sleep study, offer oxygen - 2 liters, she is never sleeping without it.  Even if she has an AHI of only 7/ hour needs to use PAP, and if needed BiPAP.   RV as soon as she has spend 60 days on PAP.   Pulmonologist referral is on the way.   Larey Seat, MD 5/39/7673, 41:93 AM  Certified in Neurology by ABPN Certified in [place] by [facility] [address] [place], [place] [address]

## 2017-08-01 ENCOUNTER — Encounter: Payer: Self-pay | Admitting: Family Medicine

## 2017-08-01 LAB — CULTURE, BLOOD (SINGLE)

## 2017-08-07 ENCOUNTER — Other Ambulatory Visit (HOSPITAL_COMMUNITY)

## 2017-08-07 ENCOUNTER — Ambulatory Visit (HOSPITAL_COMMUNITY): Admitting: Internal Medicine

## 2017-08-14 ENCOUNTER — Other Ambulatory Visit (HOSPITAL_COMMUNITY)

## 2017-08-15 ENCOUNTER — Ambulatory Visit (HOSPITAL_COMMUNITY): Admitting: Internal Medicine

## 2017-08-24 ENCOUNTER — Encounter: Payer: Self-pay | Admitting: Internal Medicine

## 2017-08-24 ENCOUNTER — Ambulatory Visit (INDEPENDENT_AMBULATORY_CARE_PROVIDER_SITE_OTHER): Admitting: Internal Medicine

## 2017-08-24 VITALS — BP 118/60 | HR 109 | Ht 63.0 in | Wt 105.6 lb

## 2017-08-24 DIAGNOSIS — J9611 Chronic respiratory failure with hypoxia: Secondary | ICD-10-CM | POA: Diagnosis not present

## 2017-08-24 DIAGNOSIS — J449 Chronic obstructive pulmonary disease, unspecified: Secondary | ICD-10-CM

## 2017-08-24 DIAGNOSIS — F17210 Nicotine dependence, cigarettes, uncomplicated: Secondary | ICD-10-CM | POA: Diagnosis not present

## 2017-08-24 MED ORDER — TIOTROPIUM BROMIDE MONOHYDRATE 2.5 MCG/ACT IN AERS: 2.0000 | INHALATION_SPRAY | Freq: Every day | RESPIRATORY_TRACT | 11 refills | Status: DC

## 2017-08-24 MED ORDER — TIOTROPIUM BROMIDE MONOHYDRATE 2.5 MCG/ACT IN AERS: 2.0000 | INHALATION_SPRAY | Freq: Every day | RESPIRATORY_TRACT | 0 refills | Status: DC

## 2017-08-24 NOTE — Progress Notes (Signed)
Subjective:     Patient ID: Jasmine [name], female   DOB: [birth year], 62 y.o.   MRN: [id number]  HPI  32 yowf active smoker  dx with asthma as child but by HS outgrew it until her 62's with flares but better between until early 2000s and maintain on multliple inhalers and then 02/2016 hosp in [place] then [facility] April 2019 and newly started on 02 = 2lpm as needed daytime and hs and referred to pulmonary clinic 08/24/2017 by Dr   Danice Goltz.    Admit date: 06/30/2017 Discharge date: 07/09/2017  Time spent: 25 minutes  Recommendations for Outpatient Follow-up:  1. pls refer to sleep medicine for osa testing/epworht/stopbang 2. pls continue cessation counseling for Tob 3. Needs cxr 1 mo 4. Consider Megace/chantix? 5. Recheck LFT and Magnesium in 1-2 weeks---has elevated LFT unknown sig [?CVC liver]  Discharge Diagnoses:  Principal Problem:   Acute respiratory failure with hypoxia (HCC) Active Problems:   Chronic back pain   HTN (hypertension)   Chronic narcotic use   Hypokalemia   COPD with acute exacerbation (HCC)   Malnutrition of moderate degree   Discharge Condition: please refert  Diet recommendation: eat anything to gain weight for now       [facility] Weights   06/30/17 2122 07/01/17 0500 07/02/17 0412  Weight: 47.2 kg (104 lb 0.9 oz) 47.2 kg (104 lb 0.9 oz) 48.1 kg (106 lb 0.7 oz)    History of present illness:  Please see note from Dr. Paulene Floor "62 y.o.femalewith a history of COPD, GERD, neuropathy, chronic pain on chronic narcotics, hypertension. Patient presents with worsening shortness of breathover the past 3 to 4 weeks. She presented to ED with acute respiratory distress with hypoxia and subsequently started on bipap"    Hospital Course:  1. Acute respiratory failure with hypoxia-patient says that she responded very well to nightly BiPAP. She was able to sleep for the first time in a very long time. ?OSA-referrinf to Sleep as OP.  Needs  screening fro pulm htn as well? Could benefit from Cardio-pulm rehab if avail  [place] this [place] 2. COPD with acute exacerbation- continue current treatments with IV steroids, antibiotics and continuous around-the-clock neb treatments-narrowed and tapered on d/c home--5 day steroid taper 3. Essential Hypertension-stable. 4. Hypokalemia-replacement ordered. recheck mag as op 5. Chronic pain with opioid dependence-resume home pain med management regimen which has been stable per patient.        08/24/2017 1st [facility] office visit/ Jasmine [name]   Chief Complaint  Patient presents with  . Pulmonary Consult    Referred by Dr. Adam Phenix.  Pt states dxed with COPD approx 10 years ago. She states she was hospitalized in April 2019 for COPD flare and sent home with o2.  She states she gets SOB with minimal exertion such as walking from room to room at home. She uses proair 3 x per wk on average and has duoneb but has not needed it since starting o2.    MMRC3 = can't walk 100 yards even at a slow pace at a flat grade s stopping due to sob  Even on 02  Rattling cough more prod first thing in am = min mucoid  maint on symbicort 160    Off prednisone x sev weeks and about the same since stopped just on symbicort 160 2bid  And   2lpm with activity    No obvious day to day or daytime variability or assoc excess/ purulent sputum or mucus plugs or  hemoptysis or cp or chest tightness, subjective wheeze or overt sinus or hb symptoms. No unusual exposure hx or h/o childhood pna/   or knowledge of premature birth.  Sleeping  2lpm and 30 degrees   without nocturnal   exacerbation  of respiratory  c/o's or need for noct saba. Also denies any obvious fluctuation of symptoms with weather or environmental changes or other aggravating or alleviating factors except as outlined above   Current Allergies, Complete Past Medical History, Past Surgical History, Family History, and Social History were reviewed in  [employer] record.  ROS  The following are not active complaints unless bolded Hoarseness, sore throat, dysphagia, dental problems, itching, sneezing,  nasal congestion or discharge of excess mucus or purulent secretions, ear ache,   fever, chills, sweats, unintended wt loss or wt gain, classically pleuritic or exertional cp,  orthopnea pnd or arm/hand swelling  or leg swelling, presyncope, palpitations, abdominal pain, anorexia, nausea, vomiting, diarrhea  or change in bowel habits or change in bladder habits, change in stools or change in urine, dysuria, hematuria,  rash, arthralgias, visual complaints, headache, numbness, weakness or ataxia or problems with walking or coordination,  change in mood or  memory.        Current Meds  Medication Sig  . amLODipine (NORVASC) 10 MG tablet TAKE 1 TABLET DAILY  . Aspirin-Salicylamide-Caffeine (BC HEADACHE POWDER PO) Take 1 packet by mouth daily as needed (headaches).   . budesonide-formoterol (SYMBICORT) 160-4.5 MCG/ACT inhaler USE 2 INHALATIONS TWICE A DAY  . calcium carbonate (OSCAL) 1500 (600 Ca) MG TABS tablet Take 600 mg by mouth daily.   . cetirizine (ZYRTEC) 10 MG tablet Take 10 mg by mouth daily as needed for allergies. Reported on 03/12/2015  . Cholecalciferol (D-3-5) 5000 units capsule Take 5,000 Units daily by mouth.  . diclofenac sodium (VOLTAREN) 1 % GEL Apply 1 application topically daily as needed for muscle pain.  . DULoxetine (CYMBALTA) 30 MG capsule Take 3 capsules (90 mg total) by mouth daily.  . feeding supplement, ENSURE ENLIVE, (ENSURE ENLIVE) LIQD Take 237 mLs by mouth 2 (two) times daily between meals.  Marland Kitchen guaiFENesin (MUCINEX) 600 MG 12 hr tablet Take 600 mg by mouth 2 (two) times daily.   Marland Kitchen HYDROcodone Bitartrate ER (HYSINGLA ER) 60 MG T24A Take 1 tablet by mouth daily.  Marland Kitchen HYDROcodone-acetaminophen (NORCO) 7.5-325 MG tablet Take 1 tablet by mouth 4 (four) times daily.  Marland Kitchen ipratropium-albuterol (DUONEB)  0.5-2.5 (3) MG/3ML SOLN Inhale 3 mLs into the lungs 4 (four) times daily as needed (for shortness of breath/wheezing). *May use as needed  . Iron Polysacch Cmplx-B12-FA 150-0.025-1 MG CAPS Take 1 capsule daily by mouth.  . magic mouthwash w/lidocaine SOLN Take 5 mLs by mouth 3 (three) times daily.  . nicotine (NICODERM CQ - DOSED IN MG/24 HOURS) 21 mg/24hr patch Place 1 patch (21 mg total) onto the skin daily.  . Omega-3 Fatty Acids (FISH OIL) 1000 MG CAPS Take 1 capsule by mouth daily.   . OXYGEN 2lpm with sleep and as needed during the day  . pantoprazole (PROTONIX) 40 MG tablet TAKE 1 TABLET DAILY  . PROAIR HFA 108 (90 Base) MCG/ACT inhaler USE 2 INHALATIONS EVERY 6 HOURS AS NEEDED FOR WHEEZING OR SHORTNESS OF BREATH  . tizanidine (ZANAFLEX) 6 MG capsule Take 1 capsule 3 (three) times daily by mouth.  . traZODone (DESYREL) 150 MG tablet Take 1.5-2 tablets (225-300 mg total) at bedtime by mouth.  Review of Systems     Objective:   Physical Exam    amb wf walks with rollator   Wt Readings from Last 3 Encounters:  08/24/17 105 lb 9.6 oz (47.9 kg)  07/31/17 106 lb (48.1 kg)  07/26/17 105 lb 3.2 oz (47.7 kg)     Vital signs reviewed - Note on arrival 02 sats  97% on RA      HEENT: nl dentition / oropharynx. Nl external ear canals without cough reflex - moderate bilateral non-specific turbinate edema     NECK :  without JVD/Nodes/TM/ nl carotid upstrokes bilaterally   LUNGS: no acc muscle use,  Mod barrel  contour chest wall with bilateral  Distant bs s audible wheeze and  without cough on insp or exp maneuver and mod   Hyperresonant  to  percussion bilaterally     CV:  RRR  no s3 or murmur or increase in P2, and no edema   ABD:  soft and nontender with pos mid insp Hoover's  in the supine position. No bruits or organomegaly appreciated, bowel sounds nl  MS:   Nl gait/  ext warm without deformities, calf tenderness, cyanosis or clubbing No obvious joint restrictions    SKIN: warm and dry without lesions    NEURO:  alert, approp, nl sensorium with  no motor or cerebellar deficits apparent.         I personally reviewed images and agree with radiology impression as follows:  CXR: 07/26/17   COPD.  No active cardiopulmonary disease.  Assessment:

## 2017-08-24 NOTE — Patient Instructions (Addendum)
Plan A = Automatic =   Symbicort 160  Take 2 puffs first thing in am and then another 2 puffs about 12 hours later and spiriva 2 pffs each am   Work on inhaler technique:  relax and gently blow all the way out then take a nice smooth deep breath back in, triggering the inhaler at same time you start breathing in.  Hold for up to 5 seconds if you can. Blow out thru nose. Rinse and gargle with water when done     Plan B = Backup Only use your albuterol (Proair)as a rescue medication to be used if you can't catch your breath by resting or doing a relaxed purse lip breathing pattern.  - The less you use it, the better it will work when you need it. - Ok to use the inhaler up to 2 puffs  every 4 hours if you must but call for appointment if use goes up over your usual need - Don't leave home without it !!  (think of it like the spare tire for your car)    Plan C = Crisis - only use your albuterol nebulizer if you first try Plan B and it fails to help > ok to use the nebulizer up to every 4 hours but if start needing it regularly call for immediate appointment   Please schedule a follow up office visit in 4 weeks, sooner if needed with pfts  - check alpha one status and refer to rehab next ov

## 2017-08-25 ENCOUNTER — Encounter: Payer: Self-pay | Admitting: Internal Medicine

## 2017-08-25 DIAGNOSIS — J9611 Chronic respiratory failure with hypoxia: Secondary | ICD-10-CM | POA: Insufficient documentation

## 2017-08-25 DIAGNOSIS — F17210 Nicotine dependence, cigarettes, uncomplicated: Secondary | ICD-10-CM | POA: Insufficient documentation

## 2017-08-25 HISTORY — DX: Nicotine dependence, cigarettes, uncomplicated: F17.210

## 2017-08-25 NOTE — Assessment & Plan Note (Signed)
As of 08/24/2017  DME- [facility]  o2 2lpm with sleep and as needed daytime  sats fine at rest on RA, advised goal with activity is to keep sats > 90%

## 2017-08-25 NOTE — Assessment & Plan Note (Addendum)
08/24/2017  After extensive coaching inhaler device  effectiveness =    75% with smi > try spiriva respimat 2.5  X 2 puffs each am   Clinically and technically GOLD IV because 02 dep at this point but no recent spirometry to document so will return for this .   Clearly  Group D in terms of symptom/risk and laba/lama/ICS  therefore appropriate rx at this point so add back lama this time in form of smi spiriva as per guidelines and refer to rehab p pfts done   Total time devoted to counseling  > 50 % of initial 60 min office visit:  review case with pt/ discussion of options/alternatives/ personally creating written customized instructions  in presence of pt  then going over those specific  Instructions directly with the pt including how to use all of the meds but in particular covering each new medication in detail and the difference between the maintenance= "automatic" meds and the prns using an action plan format for the latter (If this problem/symptom => do that organization reading Left to right).  Please see AVS from this visit for a full list of these instructions which I personally wrote for this pt and  are unique to this visit.   See device teaching which extended face to face time for this visit

## 2017-08-25 NOTE — Assessment & Plan Note (Signed)
4-5 min discussion re active cigarette smoking in addition to office E&M  Ask about tobacco use:  ongoing Advise quitting     I emphasized that although we never turn away smokers from the pulmonary clinic, we do ask that they understand that the recommendations that we make  won't work nearly as well in the presence of continued cigarette exposure.  In fact, we may very well  reach a point where we can't promise to help the patient if he/she can't quit smoking. (We can and will promise to try to help, we just can't promise what we recommend will really work)  Assess willingness not there yet but cutting down. Assist in quit attempt per pcp Arrange follow up. Per pcp

## 2017-08-27 ENCOUNTER — Telehealth: Payer: Self-pay | Admitting: Family Medicine

## 2017-08-27 NOTE — Telephone Encounter (Signed)
Per Dr. Alen Bleacher lab notes and office visit notes, no labs are needed again until patient sees Dr. Lajuana Ripple in August to establish care.  Patient's husband aware.

## 2017-09-04 ENCOUNTER — Other Ambulatory Visit (HOSPITAL_COMMUNITY): Payer: Self-pay | Admitting: *Deleted

## 2017-09-04 DIAGNOSIS — D508 Other iron deficiency anemias: Secondary | ICD-10-CM

## 2017-09-04 DIAGNOSIS — E876 Hypokalemia: Secondary | ICD-10-CM

## 2017-09-10 ENCOUNTER — Inpatient Hospital Stay (HOSPITAL_COMMUNITY): Attending: Hematology

## 2017-09-10 DIAGNOSIS — D508 Other iron deficiency anemias: Secondary | ICD-10-CM

## 2017-09-10 LAB — CBC WITH DIFFERENTIAL/PLATELET
BASOS ABS: 0 10*3/uL (ref 0.0–0.1)
BASOS PCT: 0 %
EOS ABS: 0.1 10*3/uL (ref 0.0–0.7)
EOS PCT: 1 %
HCT: 41.1 % (ref 36.0–46.0)
Hemoglobin: 12.9 g/dL (ref 12.0–15.0)
LYMPHS PCT: 15 %
Lymphs Abs: 1.1 10*3/uL (ref 0.7–4.0)
MCH: 33.2 pg (ref 26.0–34.0)
MCHC: 31.4 g/dL (ref 30.0–36.0)
MCV: 105.9 fL — AB (ref 78.0–100.0)
MONO ABS: 0.4 10*3/uL (ref 0.1–1.0)
Monocytes Relative: 6 %
Neutro Abs: 5.9 10*3/uL (ref 1.7–7.7)
Neutrophils Relative %: 78 %
PLATELETS: 144 10*3/uL — AB (ref 150–400)
RBC: 3.88 MIL/uL (ref 3.87–5.11)
RDW: 14.9 % (ref 11.5–15.5)
WBC: 7.5 10*3/uL (ref 4.0–10.5)

## 2017-09-10 LAB — COMPREHENSIVE METABOLIC PANEL
ALK PHOS: 104 U/L (ref 38–126)
ALT: 48 U/L — AB (ref 0–44)
AST: 86 U/L — AB (ref 15–41)
Albumin: 4 g/dL (ref 3.5–5.0)
Anion gap: >20 — ABNORMAL HIGH (ref 5–15)
BUN: 11 mg/dL (ref 8–23)
CO2: 17 mmol/L — ABNORMAL LOW (ref 22–32)
CREATININE: 0.69 mg/dL (ref 0.44–1.00)
Calcium: 8.8 mg/dL — ABNORMAL LOW (ref 8.9–10.3)
Chloride: 97 mmol/L — ABNORMAL LOW (ref 98–111)
GFR calc Af Amer: >60 mL/min (ref >60)
GLUCOSE: 52 mg/dL — AB (ref 70–99)
POTASSIUM: 3.5 mmol/L (ref 3.5–5.1)
Sodium: 135 mmol/L (ref 135–145)
TOTAL PROTEIN: 7.6 g/dL (ref 6.5–8.1)
Total Bilirubin: 1.4 mg/dL — ABNORMAL HIGH (ref 0.3–1.2)

## 2017-09-10 LAB — IRON AND TIBC
Iron: 180 ug/dL — ABNORMAL HIGH (ref 28–170)
Saturation Ratios: 44 % — ABNORMAL HIGH (ref 10.4–31.8)
TIBC: 410 ug/dL (ref 250–450)
UIBC: 230 ug/dL

## 2017-09-10 LAB — FERRITIN: Ferritin: 163 ng/mL (ref 11–307)

## 2017-09-11 ENCOUNTER — Encounter (HOSPITAL_COMMUNITY): Payer: Self-pay | Admitting: Internal Medicine

## 2017-09-11 ENCOUNTER — Inpatient Hospital Stay (HOSPITAL_BASED_OUTPATIENT_CLINIC_OR_DEPARTMENT_OTHER): Admitting: Internal Medicine

## 2017-09-11 VITALS — BP 120/56 | HR 113 | Temp 97.3°F | Resp 18 | Wt 102.3 lb

## 2017-09-11 DIAGNOSIS — D508 Other iron deficiency anemias: Secondary | ICD-10-CM | POA: Diagnosis not present

## 2017-09-11 NOTE — Patient Instructions (Signed)
[facility] Discharge Instructions  You saw Dr. Higgs today.   Thank you for choosing [facility] at [facility] to provide your oncology and hematology care.  To afford each patient quality time with our provider, please arrive at least 15 minutes before your scheduled appointment time.   If you have a lab appointment with the Cancer Center please come in thru the  Main Entrance and check in at the main information desk  You need to re-schedule your appointment should you arrive 10 or more minutes late.  We strive to give you quality time with our providers, and arriving late affects you and other patients whose appointments are after yours.  Also, if you no show three or more times for appointments you may be dismissed from the clinic at the providers discretion.     Again, thank you for choosing [facility].  Our hope is that these requests will decrease the amount of time that you wait before being seen by our physicians.       _____________________________________________________________  Should you have questions after your visit to  Cancer Center, please contact our office at [phone] between the hours of 8:30 a.m. and 4:30 p.m.  Voicemails left after 4:30 p.m. will not be returned until the following business day.  For prescription refill requests, have your pharmacy contact our office.       Resources For Cancer Patients and their Caregivers ? American Cancer Society: Can assist with transportation, wigs, general needs, runs Look Good Feel Better.        1-888-227-6333 ? Cancer Care: Provides financial assistance, online support groups, medication/co-pay assistance.  1-800-813-HOPE (4673) ? [facility] Assists [place] cancer patients and their families through emotional , educational and financial support.  336-427-4357 ? [place] DSS Where to apply for food  stamps, Medicaid and utility assistance. 336-342-1394 ? [facility]: Transportation to medical appointments. 336-347-2287 ? Social Security Administration: May apply for disability if have a Stage IV cancer. 336-342-7796 1-800-772-1213 ? [place] Aging, Disability and Transit Services: Assists with nutrition, care and transit needs. 336-349-2343  Cancer Center Support Programs:   > Cancer Support Group  2nd Tuesday of the month 1pm-2pm, Journey Room   > Creative Journey  3rd Tuesday of the month 1130am-1pm, Journey Room     

## 2017-09-11 NOTE — Progress Notes (Signed)
Diagnosis Other iron deficiency anemia - Plan: CBC with Differential/Platelet, Comprehensive metabolic panel, Lactate dehydrogenase, Ferritin  Staging Cancer Staging No matching staging information was found for the patient.  Assessment and Plan: 1.   Iron deficiency anemia.  Pt was last treated with IV iron in 07/2016.  Labs done 09/10/2017 reviewed with pt and showed WBC 7.5 HB 12.9 HCT 41 and plts 144,000.  Ferritin is 163.  Pt will RTC in 6 months for follow-up and repeat labs.    2.  Elevated LFTs.  This was noted on labs done 09/10/2017.  She reports she is being followed for this by PCP.  She was given the option of GI referral but does not desire referral.  Review of chart shows she had a CT of the abdomen done 04/09/2017 that showed constipation and ? Gastric wall thickening.    3.  Frequent nausea and vomiting.  She was given the option of GI referral but does not desire referral.  Follow-up with PCP.    4.  Smoking.   Cessation is recommended.  She reports she has lung screening done with PCP.    5  Weight loss.   May be due to frequent vomiting.  Option for GI evaluation which pt refused.  Follow-up with PCP.    Current Status:  Pt is seen today for follow-up to go over labs.  She reports constant nausea and vomiting as well as fatigue and weight loss.     Problem List Patient Active Problem List   Diagnosis Date Noted  . Cigarette smoker [F17.210] 08/25/2017  . Chronic respiratory failure with hypoxia ([place]) [J96.11] 08/25/2017  . Centrilobular emphysema ([place]) [J43.2] 07/09/2017  . Aortic atherosclerosis ([place]) [I70.0] 07/09/2017  . Hepatic steatosis [K76.0] 07/09/2017  . Malnutrition of moderate degree [E44.0] 07/02/2017  . Acute respiratory failure with hypoxia ([place]) [J96.01] 06/30/2017  . Hypokalemia [E87.6] 06/30/2017  . COPD with acute exacerbation ([place]) [J44.1] 06/30/2017  . Pseudoarthrosis of lumbar spine [S32.009K] 09/05/2016  . Tobacco abuse [Z72.0] 08/04/2016  . Iron  deficiency anemia [D50.9] 07/11/2016  . Easy bruising [R23.8] 07/10/2016  . Postoperative anemia due to acute blood loss [D62] 11/15/2015  . Lumbar stenosis [M48.061] 11/11/2015  . Leg pain [M79.606] 09/24/2015  . Healthcare maintenance [Z00.00] 09/02/2015  . Vitamin D deficiency [E55.9] 07/29/2015  . Chronic back pain [M54.9, G89.29] 02/22/2015  . HTN (hypertension) [I10] 02/22/2015  . Chronic narcotic use [F11.90] 02/22/2015  . 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 Headache [R51] 02/22/2015    Past Medical History Past Medical History:  Diagnosis Date  . Allergy   . Anemia   . Anxiety   . Arthritis   . Asthma   . Carpal tunnel syndrome    bilateral  . Complication of anesthesia    in the past has had N/V, the last few surgeries she has not been  . COPD (chronic obstructive pulmonary disease) ([place])   . Easy bruising 07/10/2016  . GERD (gastroesophageal reflux disease)   . Headache   . Heart murmur 2005   never had any problems  . History of blood transfusion 2018  . History of kidney stones   . Hypertension   . Hypoglycemia   . Hypoglycemia   . Iron deficiency anemia 07/11/2016  . Neuromuscular disorder ([place])    pt denies  . Neuropathy    both arms  . Normocytic anemia 07/29/2015  . Pneumonia   . PONV (postoperative nausea and vomiting)   . Sciatica   .  Seizures ([place])    as a child when she would have an asthma attack. none as an adult  . Shortness of breath dyspnea     Past Surgical History Past Surgical History:  Procedure Laterality Date  . ABDOMINAL HYSTERECTOMY  2005  . APPLICATION OF ROBOTIC ASSISTANCE FOR SPINAL PROCEDURE N/A 09/05/2016   Procedure: APPLICATION OF ROBOTIC ASSISTANCE FOR SPINAL PROCEDURE;  Surgeon: Consuella Lose, MD;  Location: [facility];  Service: Neurosurgery;  Laterality: N/A;  . CERVICAL FUSION    . [place]  . COLONOSCOPY    . cyst removal face    . disk repair     neck and lumbar region  . FOOT SURGERY  Bilateral    to file bones down   . HARDWARE REMOVAL N/A 09/05/2016   Procedure: HARDWARE REMOVAL AND REPLACEMENT OF LUMBAR FIVE SCREWS;  Surgeon: Consuella Lose, MD;  Location: [facility];  Service: Neurosurgery;  Laterality: N/A;  . IMPLANTATION / PLACEMENT EPIDURAL NEUROSTIMULATOR ELECTRODES    . LAMINECTOMY     2006  . LUMBAR FUSION    . SPINAL CORD STIMULATOR BATTERY EXCHANGE    . SPINAL CORD STIMULATOR INSERTION N/A 07/05/2016   Procedure: REPLACEMENT OF LUMBAR SPINAL CORD STIMULATOR BATTERY;  Surgeon: Newman Pies, MD;  Location: [facility];  Service: Neurosurgery;  Laterality: N/A;  . TONSILLECTOMY      Family History Family History  Problem Relation Age of Onset  . Heart disease Mother   . Diabetes Mother   . Hypertension Mother   . Hyperlipidemia Mother   . COPD Mother        smoked  . COPD Father   . Diabetes Father   . Heart disease Father   . Cancer Father        throat  . Dementia Father   . Asthma Son   . Hypertension Sister   . Hyperlipidemia Sister   . Hypertension Brother   . Hyperlipidemia Brother   . Hypertension Brother   . Hyperlipidemia Brother   . Emphysema Maternal Grandmother        smoked     Social History  reports that she has been smoking cigarettes.  She has a 70.00 pack-year smoking history. She has never used smokeless tobacco. She reports that she drinks about 1.2 oz of alcohol per week. She reports that she does not use drugs.  Medications  Current Outpatient Medications:  .  amLODipine (NORVASC) 10 MG tablet, TAKE 1 TABLET DAILY, Disp: 90 tablet, Rfl: 1 .  Aspirin-Salicylamide-Caffeine (BC HEADACHE POWDER PO), Take 1 packet by mouth daily as needed (headaches). , Disp: , Rfl:  .  budesonide-formoterol (SYMBICORT) 160-4.5 MCG/ACT inhaler, USE 2 INHALATIONS TWICE A DAY, Disp: 10.2 g, Rfl: 0 .  calcium carbonate (OSCAL) 1500 (600 Ca) MG TABS tablet, Take 600 mg by mouth daily. , Disp: , Rfl:  .  cetirizine (ZYRTEC) 10 MG tablet, Take 10 mg  by mouth daily as needed for allergies. Reported on 03/12/2015, Disp: , Rfl:  .  Cholecalciferol (D-3-5) 5000 units capsule, Take 5,000 Units daily by mouth., Disp: , Rfl:  .  diclofenac sodium (VOLTAREN) 1 % GEL, Apply 1 application topically daily as needed for muscle pain., Disp: , Rfl:  .  DULoxetine (CYMBALTA) 30 MG capsule, Take 3 capsules (90 mg total) by mouth daily., Disp: 270 capsule, Rfl: 0 .  feeding supplement, ENSURE ENLIVE, (ENSURE ENLIVE) LIQD, Take 237 mLs by mouth 2 (two) times daily between meals., Disp: 237  mL, Rfl: 12 .  guaiFENesin (MUCINEX) 600 MG 12 hr tablet, Take 600 mg by mouth 2 (two) times daily. , Disp: , Rfl:  .  HYDROcodone Bitartrate ER (HYSINGLA ER) 60 MG T24A, Take 1 tablet by mouth daily., Disp: , Rfl:  .  HYDROcodone-acetaminophen (NORCO) 7.5-325 MG tablet, Take 1 tablet by mouth 4 (four) times daily., Disp: , Rfl:  .  ipratropium-albuterol (DUONEB) 0.5-2.5 (3) MG/3ML SOLN, Inhale 3 mLs into the lungs 4 (four) times daily as needed (for shortness of breath/wheezing). *May use as needed, Disp: , Rfl:  .  Iron Polysacch Cmplx-B12-FA 150-0.025-1 MG CAPS, Take 1 capsule daily by mouth., Disp: 90 each, Rfl: 1 .  magic mouthwash w/lidocaine SOLN, Take 5 mLs by mouth 3 (three) times daily., Disp: 240 mL, Rfl: 0 .  nicotine (NICODERM CQ - DOSED IN MG/24 HOURS) 21 mg/24hr patch, Place 1 patch (21 mg total) onto the skin daily., Disp: 84 patch, Rfl: 0 .  Omega-3 Fatty Acids (FISH OIL) 1000 MG CAPS, Take 1 capsule by mouth daily. , Disp: , Rfl:  .  OXYGEN, 2lpm with sleep and as needed during the day, Disp: , Rfl:  .  pantoprazole (PROTONIX) 40 MG tablet, TAKE 1 TABLET DAILY, Disp: 90 tablet, Rfl: 3 .  PROAIR HFA 108 (90 Base) MCG/ACT inhaler, USE 2 INHALATIONS EVERY 6 HOURS AS NEEDED FOR WHEEZING OR SHORTNESS OF BREATH, Disp: 25.5 g, Rfl: 1 .  Tiotropium Bromide Monohydrate (SPIRIVA RESPIMAT) 2.5 MCG/ACT AERS, Inhale 2 puffs into the lungs daily., Disp: 1 Inhaler, Rfl: 11 .   tizanidine (ZANAFLEX) 6 MG capsule, Take 1 capsule 3 (three) times daily by mouth., Disp: , Rfl:  .  traZODone (DESYREL) 150 MG tablet, Take 1.5-2 tablets (225-300 mg total) at bedtime by mouth., Disp: 180 tablet, Rfl: 3  Allergies Lyrica [pregabalin]; Darvon [propoxyphene]; Gabapentin; Augmentin [amoxicillin-pot clavulanate]; Erythromycin; and Vibramycin [doxycycline calcium]  Review of Systems Review of Systems - Oncology ROS as per HPI otherwise 12 point ROS is negative.   Physical Exam  Vitals Wt Readings from Last 3 Encounters:  09/11/17 102 lb 4.8 oz (46.4 kg)  08/24/17 105 lb 9.6 oz (47.9 kg)  07/31/17 106 lb (48.1 kg)   Temp Readings from Last 3 Encounters:  09/11/17 (!) 97.3 F (36.3 C) (Oral)  07/26/17 98.4 F (36.9 C) (Oral)  07/09/17 (!) 97.4 F (36.3 C) (Oral)   BP Readings from Last 3 Encounters:  09/11/17 (!) 120/56  08/24/17 118/60  07/31/17 137/78   Pulse Readings from Last 3 Encounters:  09/11/17 (!) 113  08/24/17 (!) 109  07/31/17 75    Constitutional: Well-developed, well-nourished, and in no distress.   HENT: Head: Normocephalic and atraumatic.  Mouth/Throat: No oropharyngeal exudate. Mucosa moist. Eyes: Pupils are equal, round, and reactive to light. Conjunctivae are normal. No scleral icterus.  Neck: Normal range of motion. Neck supple. No JVD present.  Cardiovascular: Normal rate, regular rhythm and normal heart sounds.  Exam reveals no gallop and no friction rub.   No murmur heard. Pulmonary/Chest: Effort normal.  Coarse BS Abdominal: Soft. Bowel sounds are normal. No distension. There is no tenderness. There is no guarding.  Musculoskeletal: No edema or tenderness.  Lymphadenopathy: No cervical, axillary or supraclavicular adenopathy.  Neurological: Alert and oriented to person, place, and time. No cranial nerve deficit.  Skin: Skin is warm and dry. No rash noted. No erythema. No pallor.  Psychiatric: Affect and judgment normal.    Labs Appointment on 09/10/2017  Component  Date Value Ref Range Status  . WBC 09/10/2017 7.5  4.0 - 10.5 K/uL Final  . RBC 09/10/2017 3.88  3.87 - 5.11 MIL/uL Final  . Hemoglobin 09/10/2017 12.9  12.0 - 15.0 g/dL Final  . HCT 09/10/2017 41.1  36.0 - 46.0 % Final  . MCV 09/10/2017 105.9* 78.0 - 100.0 fL Final  . MCH 09/10/2017 33.2  26.0 - 34.0 pg Final  . MCHC 09/10/2017 31.4  30.0 - 36.0 g/dL Final  . RDW 09/10/2017 14.9  11.5 - 15.5 % Final  . Platelets 09/10/2017 144* 150 - 400 K/uL Final  . Neutrophils Relative % 09/10/2017 78  % Final  . Neutro Abs 09/10/2017 5.9  1.7 - 7.7 K/uL Final  . Lymphocytes Relative 09/10/2017 15  % Final  . Lymphs Abs 09/10/2017 1.1  0.7 - 4.0 K/uL Final  . Monocytes Relative 09/10/2017 6  % Final  . Monocytes Absolute 09/10/2017 0.4  0.1 - 1.0 K/uL Final  . Eosinophils Relative 09/10/2017 1  % Final  . Eosinophils Absolute 09/10/2017 0.1  0.0 - 0.7 K/uL Final  . Basophils Relative 09/10/2017 0  % Final  . Basophils Absolute 09/10/2017 0.0  0.0 - 0.1 K/uL Final   Performed at [facility], [address], [place], [place] [address]  . Sodium 09/10/2017 135  135 - 145 mmol/L Final  . Potassium 09/10/2017 3.5  3.5 - 5.1 mmol/L Final  . Chloride 09/10/2017 97* 98 - 111 mmol/L Final   Please note change in reference range.  . CO2 09/10/2017 17* 22 - 32 mmol/L Final  . Glucose, Bld 09/10/2017 52* 70 - 99 mg/dL Final   Please note change in reference range.  . BUN 09/10/2017 11  8 - 23 mg/dL Final   Please note change in reference range.  . Creatinine, Ser 09/10/2017 0.69  0.44 - 1.00 mg/dL Final  . Calcium 09/10/2017 8.8* 8.9 - 10.3 mg/dL Final  . Total Protein 09/10/2017 7.6  6.5 - 8.1 g/dL Final  . Albumin 09/10/2017 4.0  3.5 - 5.0 g/dL Final  . AST 09/10/2017 86* 15 - 41 U/L Final  . ALT 09/10/2017 48* 0 - 44 U/L Final   Please note change in reference range.  . Alkaline Phosphatase 09/10/2017 104  38 - 126 U/L Final  . Total Bilirubin  09/10/2017 1.4* 0.3 - 1.2 mg/dL Final  . GFR calc non Af Amer 09/10/2017 >60  >60 mL/min Final  . GFR calc Af Amer 09/10/2017 >60  >60 mL/min Final   Comment: (NOTE) The eGFR has been calculated using the CKD EPI equation. This calculation has not been validated in all clinical situations. eGFR's persistently <60 mL/min signify possible Chronic Kidney Disease.   Georgiann Hahn gap 09/10/2017 >20* 5 - 15 Final   Performed at [facility], [address] [place], [place] [address]  . Iron 09/10/2017 180* 28 - 170 ug/dL Final  . TIBC 09/10/2017 410  250 - 450 ug/dL Final  . Saturation Ratios 09/10/2017 44* 10.4 - 31.8 % Final  . UIBC 09/10/2017 230  ug/dL Final   Performed at [facility], [place] [address] [place], [place] [address]  . Ferritin 09/10/2017 163  11 - 307 ng/mL Final   Performed at [facility], [place] [address], [place], [place] [address]     Pathology Orders Placed This Encounter  Procedures  . CBC with Differential/Platelet    Standing Status:   Future    Standing Expiration Date:  09/12/2019  . Comprehensive metabolic panel    Standing Status:   Future    Standing Expiration Date:   09/12/2019  . Lactate dehydrogenase    Standing Status:   Future    Standing Expiration Date:   09/12/2019  . Ferritin    Standing Status:   Future    Standing Expiration Date:   09/12/2019       Zoila Shutter MD

## 2017-09-20 ENCOUNTER — Other Ambulatory Visit: Payer: Self-pay | Admitting: Internal Medicine

## 2017-09-20 DIAGNOSIS — J449 Chronic obstructive pulmonary disease, unspecified: Secondary | ICD-10-CM

## 2017-09-21 ENCOUNTER — Ambulatory Visit (INDEPENDENT_AMBULATORY_CARE_PROVIDER_SITE_OTHER): Admitting: Internal Medicine

## 2017-09-21 ENCOUNTER — Other Ambulatory Visit: Payer: PRIVATE HEALTH INSURANCE

## 2017-09-21 ENCOUNTER — Encounter: Payer: Self-pay | Admitting: Internal Medicine

## 2017-09-21 VITALS — BP 124/70 | HR 114 | Ht 61.81 in | Wt 104.8 lb

## 2017-09-21 DIAGNOSIS — J449 Chronic obstructive pulmonary disease, unspecified: Secondary | ICD-10-CM

## 2017-09-21 DIAGNOSIS — F17210 Nicotine dependence, cigarettes, uncomplicated: Secondary | ICD-10-CM

## 2017-09-21 DIAGNOSIS — J9611 Chronic respiratory failure with hypoxia: Secondary | ICD-10-CM | POA: Diagnosis not present

## 2017-09-21 LAB — PULMONARY FUNCTION TEST
DL/VA % pred: 62 %
DL/VA: 2.84 ml/min/mmHg/L
DLCO UNC % PRED: 55 %
DLCO unc: 11.74 ml/min/mmHg
FEF 25-75 PRE: 0.66 L/s
FEF 25-75 Post: 1.12 L/sec
FEF2575-%CHANGE-POST: 71 %
FEF2575-%PRED-PRE: 31 %
FEF2575-%Pred-Post: 54 %
FEV1-%Change-Post: 14 %
FEV1-%PRED-POST: 72 %
FEV1-%Pred-Pre: 62 %
FEV1-Post: 1.61 L
FEV1-Pre: 1.41 L
FEV1FVC-%CHANGE-POST: 0 %
FEV1FVC-%PRED-PRE: 81 %
FEV6-%CHANGE-POST: 13 %
FEV6-%PRED-PRE: 78 %
FEV6-%Pred-Post: 89 %
FEV6-Post: 2.51 L
FEV6-Pre: 2.21 L
FEV6FVC-%Change-Post: 0 %
FEV6FVC-%Pred-Post: 101 %
FEV6FVC-%Pred-Pre: 102 %
FVC-%Change-Post: 14 %
FVC-%Pred-Post: 88 %
FVC-%Pred-Pre: 77 %
FVC-Post: 2.57 L
FVC-Pre: 2.25 L
POST FEV6/FVC RATIO: 98 %
PRE FEV1/FVC RATIO: 63 %
Post FEV1/FVC ratio: 63 %
Pre FEV6/FVC Ratio: 98 %
RV % pred: 132 %
RV: 2.6 L
TLC % pred: 107 %
TLC: 5.07 L

## 2017-09-21 MED ORDER — TIOTROPIUM BROMIDE MONOHYDRATE 2.5 MCG/ACT IN AERS: 2.0000 | INHALATION_SPRAY | Freq: Every day | RESPIRATORY_TRACT | 0 refills | Status: DC

## 2017-09-21 NOTE — Patient Instructions (Addendum)
Please remember to go to the lab department downstairs in the basement  for your tests - we will call you with the results when they are available.      The key is to stop smoking completely before smoking completely stops you!    We will be referring you to rehab at [facility]    Please schedule a follow up visit in 3 months but call sooner if needed

## 2017-09-21 NOTE — Progress Notes (Signed)
Subjective:     Patient ID: Jasmine [name], female   DOB: 06-13-1955    MRN: [id number]    Brief patient profile: 14 yowf active smoker  dx with asthma as child but by HS outgrew it until her 11's with flares but better between until early 2000s and maintain on multliple inhalers and then 02/2016 hosp in [place] then [facility] April 2019 and newly started on 02 = 2lpm as needed daytime and hs and referred to pulmonary clinic 08/24/2017 by Dr   Danice Goltz.    Admit date: 06/30/2017 Discharge date: 07/09/2017  Time spent: 25 minutes  Recommendations for Outpatient Follow-up:  1. pls refer to sleep medicine for osa testing/epworht/stopbang 2. pls continue cessation counseling for Tob 3. Needs cxr 1 mo 4. Consider Megace/chantix? 5. Recheck LFT and Magnesium in 1-2 weeks---has elevated LFT unknown sig [?CVC liver]  Discharge Diagnoses:  Principal Problem:   Acute respiratory failure with hypoxia (HCC) Active Problems:   Chronic back pain   HTN (hypertension)   Chronic narcotic use   Hypokalemia   COPD with acute exacerbation (HCC)   Malnutrition of moderate degree   Discharge Condition: please refert  Diet recommendation: eat anything to gain weight for now       [facility] Weights   06/30/17 2122 07/01/17 0500 07/02/17 0412  Weight: 47.2 kg (104 lb 0.9 oz) 47.2 kg (104 lb 0.9 oz) 48.1 kg (106 lb 0.7 oz)    History of present illness:  Please see note from Dr. Paulene Floor "62 y.o.femalewith a history of COPD, GERD, neuropathy, chronic pain on chronic narcotics, hypertension. Patient presents with worsening shortness of breathover the past 3 to 4 weeks. She presented to ED with acute respiratory distress with hypoxia and subsequently started on bipap"    Hospital Course:  1. Acute respiratory failure with hypoxia-patient says that she responded very well to nightly BiPAP. She was able to sleep for the first time in a very long time. ?OSA-referrinf to Sleep as  OP.  Needs screening fro pulm htn as well? Could benefit from Cardio-pulm rehab if avail  [place] this [place] 2. COPD with acute exacerbation- continue current treatments with IV steroids, antibiotics and continuous around-the-clock neb treatments-narrowed and tapered on d/c home--5 day steroid taper 3. Essential Hypertension-stable. 4. Hypokalemia-replacement ordered. recheck mag as op 5. Chronic pain with opioid dependence-resume home pain med management regimen which has been stable per patient.        08/24/2017 1st [facility] office visit/ Wert   Chief Complaint  Patient presents with  . Pulmonary Consult    Referred by Dr. Adam Phenix.  Pt states dxed with COPD approx 10 years ago. She states she was hospitalized in April 2019 for COPD flare and sent home with o2.  She states she gets SOB with minimal exertion such as walking from room to room at home. She uses proair 3 x per wk on average and has duoneb but has not needed it since starting o2.    MMRC3 = can't walk 100 yards even at a slow pace at a flat grade s stopping due to sob  Even on 02  Rattling cough more prod first thing in am = min mucoid  maint on symbicort 160    Off prednisone x sev weeks and about the same since stopped just on symbicort 160 2bid  And   2lpm with activity  Sleeping  2lpm and 30 degrees    rec Plan A = Automatic =  Symbicort 160  Take 2 puffs first thing in am and then another 2 puffs about 12 hours later and spiriva 2 pffs each am  Work on inhaler technique:     Plan B = Backup Only use your albuterol (Proair)as a rescue medication Plan C = Crisis - only use your albuterol nebulizer if you first try Plan B  Please schedule a follow up office visit in 4 weeks, sooner if needed with pfts  - check alpha one status and refer to rehab next ov      09/21/2017  f/u ov/Wert re:  Copd GOLD II / still smoking / maint on symb 160/spiriva  Chief Complaint  Patient presents with  . Follow-up     PFT today, shortness of breath  Dyspnea:  MMRC3 = can't walk 100 yards even at a slow pace at a flat grade s stopping due to sob   Cough: none  SABA use: rare 02: 2lpm hs and with activity and getting set up for POC   No obvious day to day or daytime variability or assoc excess/ purulent sputum or mucus plugs or hemoptysis or cp or chest tightness, subjective wheeze or overt sinus or hb symptoms.   Sleeping: on back wedge 40 degrees/ 2lpm  without nocturnal  or early am exacerbation  of respiratory  c/o's or need for noct saba. Also denies any obvious fluctuation of symptoms with weather or environmental changes or other aggravating or alleviating factors except as outlined above   No unusual exposure hx or h/o childhood pna/ asthma or knowledge of premature birth.  Current Allergies, Complete Past Medical History, Past Surgical History, Family History, and Social History were reviewed in [employer] record.  ROS  The following are not active complaints unless bolded Hoarseness, sore throat, dysphagia, dental problems, itching, sneezing,  nasal congestion or discharge of excess mucus or purulent secretions, ear ache,   fever, chills, sweats, unintended wt loss or wt gain, classically pleuritic or exertional cp,  orthopnea pnd or arm/hand swelling  or leg swelling, presyncope, palpitations, abdominal pain, anorexia, nausea, vomiting, diarrhea  or change in bowel habits or change in bladder habits, change in stools or change in urine, dysuria, hematuria,  rash, arthralgias, visual complaints, headache, numbness, weakness or ataxia or problems with walking or coordination,  change in mood or  memory.        Current Meds  Medication Sig  . amLODipine (NORVASC) 10 MG tablet TAKE 1 TABLET DAILY  . Aspirin-Salicylamide-Caffeine (BC HEADACHE POWDER PO) Take 1 packet by mouth daily as needed (headaches).   . budesonide-formoterol (SYMBICORT) 160-4.5 MCG/ACT inhaler USE 2  INHALATIONS TWICE A DAY  . calcium carbonate (OSCAL) 1500 (600 Ca) MG TABS tablet Take 600 mg by mouth daily.   . cetirizine (ZYRTEC) 10 MG tablet Take 10 mg by mouth daily as needed for allergies. Reported on 03/12/2015  . Cholecalciferol (D-3-5) 5000 units capsule Take 5,000 Units daily by mouth.  . diclofenac sodium (VOLTAREN) 1 % GEL Apply 1 application topically daily as needed for muscle pain.  . DULoxetine (CYMBALTA) 30 MG capsule Take 3 capsules (90 mg total) by mouth daily.  . feeding supplement, ENSURE ENLIVE, (ENSURE ENLIVE) LIQD Take 237 mLs by mouth 2 (two) times daily between meals.  Marland Kitchen guaiFENesin (MUCINEX) 600 MG 12 hr tablet Take 600 mg by mouth 2 (two) times daily.   Marland Kitchen HYDROcodone Bitartrate ER (HYSINGLA ER) 60 MG T24A Take 1 tablet by mouth daily.  Marland Kitchen  HYDROcodone-acetaminophen (NORCO) 7.5-325 MG tablet Take 1 tablet by mouth 4 (four) times daily.  Marland Kitchen ipratropium-albuterol (DUONEB) 0.5-2.5 (3) MG/3ML SOLN Inhale 3 mLs into the lungs 4 (four) times daily as needed (for shortness of breath/wheezing). *May use as needed  . Iron Polysacch Cmplx-B12-FA 150-0.025-1 MG CAPS Take 1 capsule daily by mouth.  . magic mouthwash w/lidocaine SOLN Take 5 mLs by mouth 3 (three) times daily.  . nicotine (NICODERM CQ - DOSED IN MG/24 HOURS) 21 mg/24hr patch Place 1 patch (21 mg total) onto the skin daily.  . Omega-3 Fatty Acids (FISH OIL) 1000 MG CAPS Take 1 capsule by mouth daily.   . OXYGEN 2lpm with sleep and as needed during the day  . pantoprazole (PROTONIX) 40 MG tablet TAKE 1 TABLET DAILY  . PROAIR HFA 108 (90 Base) MCG/ACT inhaler USE 2 INHALATIONS EVERY 6 HOURS AS NEEDED FOR WHEEZING OR SHORTNESS OF BREATH  . Tiotropium Bromide Monohydrate (SPIRIVA RESPIMAT) 2.5 MCG/ACT AERS Inhale 2 puffs into the lungs daily.  . tizanidine (ZANAFLEX) 6 MG capsule Take 1 capsule 3 (three) times daily by mouth.  . traZODone (DESYREL) 150 MG tablet Take 1.5-2 tablets (225-300 mg total) at bedtime by mouth.  .  [DISCONTINUED] Tiotropium Bromide Monohydrate (SPIRIVA RESPIMAT) 2.5 MCG/ACT AERS Inhale 2 puffs into the lungs daily.                Objective:   Physical Exam  Elderly appearing  wf nad amb with rollator   09/21/2017        104   08/24/17 105 lb 9.6 oz (47.9 kg)  07/31/17 106 lb (48.1 kg)  07/26/17 105 lb 3.2 oz (47.7 kg)     Vital signs reviewed - Note on arrival 02 sats  95% on RA       HEENT: nl dentition / oropharynx. Nl external ear canals without cough reflex - moderate bilateral non-specific turbinate edema     NECK :  without JVD/Nodes/TM/ nl carotid upstrokes bilaterally   LUNGS: no acc muscle use,  Mod barrel  contour chest wall with bilateral  Distant bs s audible wheeze and  without cough on insp or exp maneuver and mod   Hyperresonant  to  percussion bilaterally     CV:  RRR  no s3 or murmur or increase in P2, and no edema   ABD:  soft and nontender with pos mid insp Hoover's  in the supine position. No bruits or organomegaly appreciated, bowel sounds nl  MS:   Nl gait/  ext warm without deformities, calf tenderness, cyanosis or clubbing No obvious joint restrictions   SKIN: warm and dry without lesions    NEURO:  alert, approp, nl sensorium with  no motor or cerebellar deficits apparent.          Assessment:

## 2017-09-21 NOTE — Progress Notes (Signed)
PFT completed today. 09/21/17 

## 2017-09-22 ENCOUNTER — Encounter: Payer: Self-pay | Admitting: Internal Medicine

## 2017-09-22 NOTE — Assessment & Plan Note (Signed)
08/24/2017  > try spiriva respimat 2.5  X 2 puffs each am  - PFT's  09/21/2017  FEV1 1.61 (72 % ) ratio 63  p 14 % improvement from saba p nothing prior to study with DLCO  55 % corrects to 62 % for alv volume   - 09/21/2017  After extensive coaching inhaler device  effectiveness =    75% with smi and hfa  - alpha one screen 09/21/2017     Group D in terms of symptom/risk and laba/lama/ICS  therefore appropriate rx at this point so continue symb / spiriva though could consider trelegy if insurance coverage better.   I had an extended discussion with the patient reviewing all relevant studies completed to date and  lasting 15 to 20 minutes of a 25 minute visit    See device teaching which extended face to face time for this visit.  Each maintenance medication was reviewed in detail including emphasizing most importantly the difference between maintenance and prns and under what circumstances the prns are to be triggered using an action plan format that is not reflected in the computer generated alphabetically organized AVS which I have not found useful in most complex patients, especially with respiratory illnesses  Please see AVS for specific instructions unique to this visit that I personally wrote and verbalized to the the pt in detail and then reviewed with pt  by my nurse highlighting any  changes in therapy recommended at today's visit to their plan of care.

## 2017-09-22 NOTE — Assessment & Plan Note (Signed)
As of 08/24/2017  DME- [facility]  o2 2lpm with sleep and as needed daytime  Arranging for POC for daytime use/ referred to rehab at [facility]

## 2017-09-22 NOTE — Assessment & Plan Note (Signed)
4-5 min discussion re active cigarette smoking in addition to office E&M  Ask about tobacco use:   Ongoing  Advise quitting    I reviewed the Fletcher curve with the patient that basically indicates  if you quit smoking when your best day FEV1 is still  preserved (as is surprisingly still relatively the case here)  it is highly unlikely you will progress to severe disease and informed the patient there was  no medication on the market that has proven to alter the curve/ its downward trajectory  or the likelihood of progression of their disease(unlike other chronic medical conditions such as atheroclerosis where we do think we can change the natural hx with risk reducing meds)    Therefore stopping smoking and maintaining abstinence are  the most important aspects of care, not choice of inhalers or for that matter, doctors.   Treatment other than smoking cessation  is entirely directed by severity of symptoms and focused also on reducing exacerbations, not attempting to change the natural history of the disease.   Assess willingness:  Not committed at this point Assist in quit attempt:  Per PCP when ready Arrange follow up:   Follow up per Primary Care planned

## 2017-09-27 LAB — ALPHA-1 ANTITRYPSIN PHENOTYPE: A-1 Antitrypsin, Ser: 191 mg/dL (ref 83–199)

## 2017-09-28 NOTE — Progress Notes (Signed)
LMTCB

## 2017-10-01 NOTE — Progress Notes (Signed)
LMTCB

## 2017-10-03 NOTE — Progress Notes (Signed)
Spoke with the pt's spouse (okk per [employer]) and notified of results.

## 2017-10-29 ENCOUNTER — Encounter: Payer: Self-pay | Admitting: Family Medicine

## 2017-10-29 ENCOUNTER — Ambulatory Visit (INDEPENDENT_AMBULATORY_CARE_PROVIDER_SITE_OTHER): Admitting: Family Medicine

## 2017-10-29 VITALS — BP 171/88 | HR 78 | Temp 98.4°F | Ht 61.0 in | Wt 106.2 lb

## 2017-10-29 DIAGNOSIS — I10 Essential (primary) hypertension: Secondary | ICD-10-CM | POA: Diagnosis not present

## 2017-10-29 DIAGNOSIS — K760 Fatty (change of) liver, not elsewhere classified: Secondary | ICD-10-CM

## 2017-10-29 DIAGNOSIS — Z720 Tobacco use: Secondary | ICD-10-CM

## 2017-10-29 NOTE — Progress Notes (Signed)
Subjective: CC: HTN, Fatty liver, tobacco use PCP: Janora Norlander, DO Jasmine [name] is a 62 y.o. female presenting to clinic today for:  1.  Hypertension Patient reports compliance with medications but notes that she did not take her medicines this morning as she was rushing out the door and forgot.  Denies any chest pain, shortness of breath, headache or visual disturbance.  She reports good tolerance of blood pressure medication, Norvasc 10 mg daily.  She does not need refills today.  2.  Tobacco use disorder Patient reports that she has cut her smoking down to half a pack per day.  This is down from 2 packs/day.  She continues to have some shortness of breath but notes that breathing has improved quite a bit on current inhaler regimen and O2 at nighttime.  She is following with pulmonology and has plans for sleep study soon.  No hemoptysis.  She continues to have difficulty maintaining weight.  3.  Fatty liver Patient noted to have elevated LFTs during the last laboratory draw.  She has history of fatty liver.  She notes that she continues to work on improving her weight by supplementing with Ensure.  She had a 5-day history of nausea and vomiting after her husband went out of town.  She attributes this to panic.  Denies any hematochezia or melena.  Symptoms have totally resolved since he returned from his trip.  She does not wish to see a specialist at this time.   ROS: Per HPI  Allergies  Allergen Reactions  . Lyrica [Pregabalin] Other (See Comments)    EXTREME SHAKING/TREMBLING  . Darvon [Propoxyphene]     UNSPECIFIED REACTION   . Gabapentin     Extreme shaking and trembling   . Augmentin [Amoxicillin-Pot Clavulanate] Nausea And Vomiting    Has patient had a PCN reaction causing immediate rash, facial/tongue/throat swelling, SOB or lightheadedness with hypotension:no Has patient had a PCN reaction causing severe rash involving mucus membranes or skin necrosis:No Has  patient had a PCN reaction that required hospitalization:No Has patient had a PCN reaction occurring within the last 10 years:Yes--ONLY N/V If all of the above answers are "NO", then may proceed with Cephalosporin use.   . Erythromycin Itching and Rash       . Vibramycin [Doxycycline Calcium] Itching and Rash    "[place] "   Past Medical History:  Diagnosis Date  . Allergy   . Anemia   . Anxiety   . Arthritis   . Asthma   . Carpal tunnel syndrome    bilateral  . Cigarette smoker 08/25/2017  . Complication of anesthesia    in the past has had N/V, the last few surgeries she has not been  . COPD (chronic obstructive pulmonary disease) ([place])   . Easy bruising 07/10/2016  . GERD (gastroesophageal reflux disease)   . Headache   . Heart murmur 2005   never had any problems  . History of blood transfusion 2018  . History of kidney stones   . Hypertension   . Hypoglycemia   . Hypoglycemia   . Iron deficiency anemia 07/11/2016  . Neuromuscular disorder ([place])    pt denies  . Neuropathy    both arms  . Normocytic anemia 07/29/2015  . Pneumonia   . PONV (postoperative nausea and vomiting)   . Postoperative anemia due to acute blood loss 11/15/2015  . Sciatica   . Seizures ([place])    as a child when she would have  an asthma attack. none as an adult  . Shortness of breath dyspnea     Current Outpatient Medications:  .  amLODipine (NORVASC) 10 MG tablet, TAKE 1 TABLET DAILY, Disp: 90 tablet, Rfl: 1 .  Aspirin-Salicylamide-Caffeine (BC HEADACHE POWDER PO), Take 1 packet by mouth daily as needed (headaches). , Disp: , Rfl:  .  budesonide-formoterol (SYMBICORT) 160-4.5 MCG/ACT inhaler, USE 2 INHALATIONS TWICE A DAY, Disp: 10.2 g, Rfl: 0 .  calcium carbonate (OSCAL) 1500 (600 Ca) MG TABS tablet, Take 600 mg by mouth daily. , Disp: , Rfl:  .  cetirizine (ZYRTEC) 10 MG tablet, Take 10 mg by mouth daily as needed for allergies. Reported on 03/12/2015, Disp: , Rfl:  .  Cholecalciferol (D-3-5)  5000 units capsule, Take 5,000 Units daily by mouth., Disp: , Rfl:  .  diclofenac sodium (VOLTAREN) 1 % GEL, Apply 1 application topically daily as needed for muscle pain., Disp: , Rfl:  .  DULoxetine (CYMBALTA) 30 MG capsule, Take 3 capsules (90 mg total) by mouth daily., Disp: 270 capsule, Rfl: 0 .  feeding supplement, ENSURE ENLIVE, (ENSURE ENLIVE) LIQD, Take 237 mLs by mouth 2 (two) times daily between meals., Disp: 237 mL, Rfl: 12 .  guaiFENesin (MUCINEX) 600 MG 12 hr tablet, Take 600 mg by mouth 2 (two) times daily. , Disp: , Rfl:  .  HYDROcodone Bitartrate ER (HYSINGLA ER) 60 MG T24A, Take 1 tablet by mouth daily., Disp: , Rfl:  .  HYDROcodone-acetaminophen (NORCO) 7.5-325 MG tablet, Take 1 tablet by mouth 4 (four) times daily., Disp: , Rfl:  .  ipratropium-albuterol (DUONEB) 0.5-2.5 (3) MG/3ML SOLN, Inhale 3 mLs into the lungs 4 (four) times daily as needed (for shortness of breath/wheezing). *May use as needed, Disp: , Rfl:  .  Iron Polysacch Cmplx-B12-FA 150-0.025-1 MG CAPS, Take 1 capsule daily by mouth., Disp: 90 each, Rfl: 1 .  nicotine (NICODERM CQ - DOSED IN MG/24 HOURS) 21 mg/24hr patch, Place 1 patch (21 mg total) onto the skin daily., Disp: 84 patch, Rfl: 0 .  Omega-3 Fatty Acids (FISH OIL) 1000 MG CAPS, Take 1 capsule by mouth daily. , Disp: , Rfl:  .  OXYGEN, 2lpm with sleep and as needed during the day, Disp: , Rfl:  .  pantoprazole (PROTONIX) 40 MG tablet, TAKE 1 TABLET DAILY, Disp: 90 tablet, Rfl: 3 .  PROAIR HFA 108 (90 Base) MCG/ACT inhaler, USE 2 INHALATIONS EVERY 6 HOURS AS NEEDED FOR WHEEZING OR SHORTNESS OF BREATH, Disp: 25.5 g, Rfl: 1 .  Tiotropium Bromide Monohydrate (SPIRIVA RESPIMAT) 2.5 MCG/ACT AERS, Inhale 2 puffs into the lungs daily., Disp: 1 Inhaler, Rfl: 0 .  tizanidine (ZANAFLEX) 6 MG capsule, Take 1 capsule 3 (three) times daily by mouth., Disp: , Rfl:  .  traZODone (DESYREL) 150 MG tablet, Take 1.5-2 tablets (225-300 mg total) at bedtime by mouth., Disp: 180  tablet, Rfl: 3 .  magic mouthwash w/lidocaine SOLN, Take 5 mLs by mouth 3 (three) times daily. (Patient not taking: Reported on 10/29/2017), Disp: 240 mL, Rfl: 0 Social History   Socioeconomic History  . Marital status: Married    Spouse name: Not on file  . Number of children: Not on file  . Years of education: Not on file  . Highest education level: Not on file  Occupational History  . Not on file  Social Needs  . Financial resource strain: Not on file  . Food insecurity:    Worry: Not on file    Inability: Not  on file  . Transportation needs:    Medical: Not on file    Non-medical: Not on file  Tobacco Use  . Smoking status: Current Every Day Smoker    Packs/day: 2.00    Years: 35.00    Pack years: 70.00    Types: Cigarettes  . Smokeless tobacco: Never Used  . Tobacco comment: trying to quit  Substance and Sexual Activity  . Alcohol use: Yes    Alcohol/week: 2.0 standard drinks    Types: 2 Glasses of wine per week    Comment: occasionally  . Drug use: No  . Sexual activity: Not on file  Lifestyle  . Physical activity:    Days per week: Not on file    Minutes per session: Not on file  . Stress: Not on file  Relationships  . Social connections:    Talks on phone: Not on file    Gets together: Not on file    Attends religious service: Not on file    Active member of club or organization: Not on file    Attends meetings of clubs or organizations: Not on file    Relationship status: Not on file  . Intimate partner violence:    Fear of current or ex partner: Not on file    Emotionally abused: Not on file    Physically abused: Not on file    Forced sexual activity: Not on file  Other Topics Concern  . Not on file  Social History Narrative  . Not on file   Family History  Problem Relation Age of Onset  . Heart disease Mother   . Diabetes Mother   . Hypertension Mother   . Hyperlipidemia Mother   . COPD Mother        smoked  . COPD Father   . Diabetes  Father   . Heart disease Father   . Cancer Father        throat  . Dementia Father   . Asthma Son   . Hypertension Sister   . Hyperlipidemia Sister   . Hypertension Brother   . Hyperlipidemia Brother   . Hypertension Brother   . Hyperlipidemia Brother   . Emphysema Maternal Grandmother        smoked    Objective: Office vital signs reviewed. BP (!) 171/88   Pulse 78   Temp 98.4 F (36.9 C) (Oral)   Ht _0  (1.549 m)   Wt 106 lb 3.2 oz (48.2 kg)   BMI 20.07 kg/m   Physical Examination:  General: Awake, alert, thin, chronically ill appearing, No acute distress HEENT: sclera white, MMM Cardio: regular rate and rhythm, S1S2 heard, no murmurs appreciated Pulm: Globally decreased breath sounds.  Otherwise, clear to auscultation bilaterally, no wheezes, rhonchi or rales; normal work of breathing on room air Extremities: warm, well perfused, No edema, cyanosis or clubbing; +2 pulses bilaterally MSK: uses walker for ambulation  Assessment/ Plan: 63 y.o. female   1. Essential hypertension Blood pressure not at goal but patient has not taken her blood pressure medications.  I recommended that she take her Norvasc and monitor blood pressure.  She will call the office with blood pressure readings in 1 week.  If blood pressure persistently elevated, we need to consider adding medication.  Would consider ACE inhibitor versus ARB. - CMP14+EGFR  2. Hepatic steatosis Check lipid panel.  Patient has normal weight.  I did recommend consideration for evaluation by GI but patient declined this today. - Lipid  Panel  3. Tobacco abuse She is in the action phase of cessation.  She is down from 2 packs/day to 1/2 pack/day.  She is actively working on smoking cessation.   Orders Placed This Encounter  Procedures  . CMP14+EGFR  . Lipid Panel    Janora Norlander, [place] [phone]

## 2017-10-30 ENCOUNTER — Encounter: Payer: Self-pay | Admitting: Family Medicine

## 2017-10-30 ENCOUNTER — Telehealth: Payer: Self-pay | Admitting: *Deleted

## 2017-10-30 ENCOUNTER — Other Ambulatory Visit: Payer: Self-pay | Admitting: Family Medicine

## 2017-10-30 DIAGNOSIS — G8929 Other chronic pain: Secondary | ICD-10-CM

## 2017-10-30 DIAGNOSIS — M549 Dorsalgia, unspecified: Principal | ICD-10-CM

## 2017-10-30 LAB — CMP14+EGFR
A/G RATIO: 1.8 (ref 1.2–2.2)
ALBUMIN: 4.2 g/dL (ref 3.6–4.8)
ALT: 31 IU/L (ref 0–32)
AST: 29 IU/L (ref 0–40)
Alkaline Phosphatase: 78 IU/L (ref 39–117)
BILIRUBIN TOTAL: 0.3 mg/dL (ref 0.0–1.2)
BUN / CREAT RATIO: 22 (ref 12–28)
BUN: 11 mg/dL (ref 8–27)
CALCIUM: 9.6 mg/dL (ref 8.7–10.3)
CHLORIDE: 103 mmol/L (ref 96–106)
CO2: 22 mmol/L (ref 20–29)
Creatinine, Ser: 0.51 mg/dL — ABNORMAL LOW (ref 0.57–1.00)
GFR, EST AFRICAN AMERICAN: 118 mL/min/{1.73_m2} (ref >59)
GFR, EST NON AFRICAN AMERICAN: 102 mL/min/{1.73_m2} (ref >59)
Globulin, Total: 2.4 g/dL (ref 1.5–4.5)
Glucose: 106 mg/dL — ABNORMAL HIGH (ref 65–99)
Potassium: 3.5 mmol/L (ref 3.5–5.2)
Sodium: 142 mmol/L (ref 134–144)
TOTAL PROTEIN: 6.6 g/dL (ref 6.0–8.5)

## 2017-10-30 LAB — LIPID PANEL
CHOL/HDL RATIO: 2.8 ratio (ref 0.0–4.4)
Cholesterol, Total: 247 mg/dL — ABNORMAL HIGH (ref 100–199)
HDL: 89 mg/dL (ref >39)
LDL Calculated: 139 mg/dL — ABNORMAL HIGH (ref 0–99)
Triglycerides: 95 mg/dL (ref 0–149)
VLDL Cholesterol Cal: 19 mg/dL (ref 5–40)

## 2017-10-30 MED ORDER — ATORVASTATIN CALCIUM 40 MG PO TABS: 40.0000 mg | ORAL_TABLET | Freq: Every day | ORAL | 1 refills | Status: DC

## 2017-10-30 MED ORDER — DULOXETINE HCL 30 MG PO CPEP: 90.0000 mg | ORAL_CAPSULE | Freq: Every day | ORAL | 0 refills | Status: DC

## 2017-10-30 NOTE — Telephone Encounter (Signed)
Ok to refill 

## 2017-10-30 NOTE — Telephone Encounter (Signed)
Fax received from [facility] Refill Metanx 1 tablet BID #[id number] w/ 3 RFs

## 2017-10-31 ENCOUNTER — Other Ambulatory Visit: Payer: Self-pay | Admitting: Family Medicine

## 2017-10-31 ENCOUNTER — Encounter: Payer: Self-pay | Admitting: Family Medicine

## 2017-10-31 MED ORDER — METANX 3-90.314-2-35 MG PO CAPS: 1.0000 | ORAL_CAPSULE | Freq: Two times a day (BID) | ORAL | 4 refills | Status: DC

## 2017-11-21 ENCOUNTER — Encounter: Payer: Self-pay | Admitting: Family Medicine

## 2017-12-10 ENCOUNTER — Other Ambulatory Visit: Payer: Self-pay | Admitting: *Deleted

## 2017-12-10 MED ORDER — AMLODIPINE BESYLATE 10 MG PO TABS: 10.0000 mg | ORAL_TABLET | Freq: Every day | ORAL | 1 refills | Status: DC

## 2017-12-10 MED ORDER — PANTOPRAZOLE SODIUM 40 MG PO TBEC: 40.0000 mg | DELAYED_RELEASE_TABLET | Freq: Every day | ORAL | 1 refills | Status: DC

## 2017-12-10 MED ORDER — TRAZODONE HCL 150 MG PO TABS: 225.0000 mg | ORAL_TABLET | Freq: Every day | ORAL | 0 refills | Status: DC

## 2017-12-24 ENCOUNTER — Ambulatory Visit: Payer: PRIVATE HEALTH INSURANCE | Admitting: Internal Medicine

## 2018-01-09 ENCOUNTER — Ambulatory Visit (INDEPENDENT_AMBULATORY_CARE_PROVIDER_SITE_OTHER): Admitting: Internal Medicine

## 2018-01-09 ENCOUNTER — Encounter: Payer: Self-pay | Admitting: Internal Medicine

## 2018-01-09 DIAGNOSIS — J449 Chronic obstructive pulmonary disease, unspecified: Secondary | ICD-10-CM

## 2018-01-09 DIAGNOSIS — J9611 Chronic respiratory failure with hypoxia: Secondary | ICD-10-CM | POA: Diagnosis not present

## 2018-01-09 DIAGNOSIS — F17210 Nicotine dependence, cigarettes, uncomplicated: Secondary | ICD-10-CM | POA: Diagnosis not present

## 2018-01-09 MED ORDER — TIOTROPIUM BROMIDE MONOHYDRATE 2.5 MCG/ACT IN AERS: 2.0000 | INHALATION_SPRAY | Freq: Every day | RESPIRATORY_TRACT | 0 refills | Status: DC

## 2018-01-09 NOTE — Assessment & Plan Note (Addendum)
08/24/2017  > try spiriva respimat 2.5  X 2 puffs each am  - PFT's  09/21/2017  FEV1 1.61 (72 % ) ratio 63  p 14 % improvement from saba p nothing prior to study with DLCO  55 % corrects to 62 % for alv volume   - 09/21/2017  After extensive coaching inhaler device  effectiveness =    75% with smi and hfa  - alpha one screen 09/21/2017  MM level 191   - 01/09/2018  After extensive coaching inhaler device,  effectiveness =    75% (short Ti)    Group D in terms of symptom/risk and laba/lama/ICS  therefore appropriate rx at this point so no change in rx needed.  If she stops smoking likely will be less likely to experience aecopd and could be considered for lama/laba but for now will continue triple rx and f/u q 6 m, sooner if needed   Discussed Formulary restrictions will be an ongoing challenge for the forseable future and I would be happy to pick an alternative if the pt will first  provide me a list of them but pt  will need to return here for training for any new device that is required eg dpi vs hfa vs respimat.    In meantime we can always provide samples so the patient never runs out of any needed respiratory medications.

## 2018-01-09 NOTE — Assessment & Plan Note (Signed)
As of 08/24/2017  DME- [facility]  o2 2lpm with sleep and as needed daytime   Adequate control on present rx, reviewed in detail with pt > no change in rx needed

## 2018-01-09 NOTE — Assessment & Plan Note (Signed)
4-5 min discussion re active cigarette smoking in addition to office E&M  Ask about tobacco use:   active Advise quitting    Discussed the risks and costs (both direct and indirect)  of smoking relative to the benefits of quitting but patient unwilling to commit at this point to a specific quit date.   Assess willingness:  Not committed to completely quit but has it down to one-half ppd Assist in quit attempt:  Per PCP when ready Arrange follow up:   Follow up per Primary Care planned      I had an extended discussion with the patient reviewing all relevant studies completed to date and  lasting 15 to 20 minutes of a 25 minute visit    See device teaching which extended face to face time for this visit.  Each maintenance medication was reviewed in detail including emphasizing most importantly the difference between maintenance and prns and under what circumstances the prns are to be triggered using an action plan format that is not reflected in the computer generated alphabetically organized AVS which I have not found useful in most complex patients, especially with respiratory illnesses  Please see AVS for specific instructions unique to this visit that I personally wrote and verbalized to the the pt in detail and then reviewed with pt  by my nurse highlighting any  changes in therapy recommended at today's visit to their plan of care.

## 2018-01-09 NOTE — Patient Instructions (Signed)
Work on inhaler technique:  relax and gently blow all the way out then take a nice smooth deep breath back in, triggering the inhaler at same time you start breathing in.  Hold for up to 5 seconds if you can. Blow out symbicort out thru nose. Rinse and gargle with water when done   Please schedule a follow up visit in 6  months but call sooner if needed

## 2018-01-09 NOTE — Progress Notes (Signed)
Subjective:     Patient ID: Jasmine [name], female   DOB: 1955/12/04    MRN: [id number]    Brief patient profile: 62 yowf active smoker  dx with asthma as child but by HS outgrew it until her 41's with flares but better between until early 2000s and maintain on multliple inhalers and then 02/2016 hosp in [place] then [facility] April 2019 and newly started on 02 = 2lpm as needed daytime and hs and referred to pulmonary clinic 08/24/2017 by Dr   Danice Goltz.    Admit date: 06/30/2017 Discharge date: 07/09/2017  Time spent: 25 minutes  Recommendations for Outpatient Follow-up:  1. pls refer to sleep medicine for osa testing/epworht/stopbang 2. pls continue cessation counseling for Tob 3. Needs cxr 1 mo 4. Consider Megace/chantix? 5. Recheck LFT and Magnesium in 1-2 weeks---has elevated LFT unknown sig [?CVC liver]  Discharge Diagnoses:  Principal Problem:   Acute respiratory failure with hypoxia (HCC) Active Problems:   Chronic back pain   HTN (hypertension)   Chronic narcotic use   Hypokalemia   COPD with acute exacerbation (HCC)   Malnutrition of moderate degree   Discharge Condition: please refert  Diet recommendation: eat anything to gain weight for now       [facility] Weights   06/30/17 2122 07/01/17 0500 07/02/17 0412  Weight: 47.2 kg (104 lb 0.9 oz) 47.2 kg (104 lb 0.9 oz) 48.1 kg (106 lb 0.7 oz)    History of present illness:  Please see note from Dr. Paulene Floor "62 y.o.femalewith a history of COPD, GERD, neuropathy, chronic pain on chronic narcotics, hypertension. Patient presents with worsening shortness of breathover the past 3 to 4 weeks. She presented to ED with acute respiratory distress with hypoxia and subsequently started on bipap"    Hospital Course:  1. Acute respiratory failure with hypoxia-patient says that she responded very well to nightly BiPAP. She was able to sleep for the first time in a very long time. ?OSA-referrinf to Sleep as  OP.  Needs screening fro pulm htn as well? Could benefit from Cardio-pulm rehab if avail  [place] 2. COPD with acute exacerbation- continue current treatments with IV steroids, antibiotics and continuous around-the-clock neb treatments-narrowed and tapered on d/c home--5 day steroid taper 3. Essential Hypertension-stable. 4. Hypokalemia-replacement ordered. recheck mag as op 5. Chronic pain with opioid dependence-resume home pain med management regimen which has been stable per patient.        08/24/2017 1st [facility] office visit/ Jasmine [name]   Chief Complaint  Patient presents with  . Pulmonary Consult    Referred by Dr. Adam Phenix.  Pt states dxed with COPD approx 10 years ago. She states she was hospitalized in April 2019 for COPD flare and sent home with o2.  She states she gets SOB with minimal exertion such as walking from room to room at home. She uses proair 3 x per wk on average and has duoneb but has not needed it since starting o2.    MMRC3 = can't walk 100 yards even at a slow pace at a flat grade s stopping due to sob  Even on 02  Rattling cough more prod first thing in am = min mucoid  maint on symbicort 160    Off prednisone x sev weeks and about the same since stopped just on symbicort 160 2bid  And   2lpm with activity  Sleeping  2lpm and 30 degrees    rec Plan A = Automatic =  Symbicort 160  Take 2 puffs first thing in am and then another 2 puffs about 12 hours later and spiriva 2 pffs each am  Work on inhaler technique:     Plan B = Backup Only use your albuterol (Proair)as a rescue medication Plan C = Crisis - only use your albuterol nebulizer if you first try Plan B  Please schedule a follow up office visit in 4 weeks, sooner if needed with pfts  - check alpha one status and refer to rehab next ov      09/21/2017  f/u ov/Jasmine [name] re:  Copd GOLD II / still smoking / maint on symb 160/spiriva  Chief Complaint  Patient presents with  . Follow-up     PFT today, shortness of breath  Dyspnea:  MMRC3 = can't walk 100 yards even at a slow pace at a flat grade s stopping due to sob   Cough: none SABA use: rare 02: 2lpm hs and with activity and getting set up for POC rec No change rx   01/09/2018  f/u ov/Jasmine [name] re:  Copd gold II on symbicort / spiriva / still smoking  Chief Complaint  Patient presents with  . Follow-up    Breathing is overall doing well and she denies any new co's. She rarely uses her proair.   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   Sleeping as above  without nocturnal  or early am exacerbation  of respiratory  c/o's or need for noct saba. Also denies any obvious fluctuation of symptoms with weather or environmental changes or other aggravating or alleviating factors except as outlined above   No unusual exposure hx or h/o childhood pna/ asthma or knowledge of premature birth.  Current Allergies, Complete Past Medical History, Past Surgical History, Family History, and Social History were reviewed in [employer] record.  ROS  The following are not active complaints unless bolded Hoarseness, sore throat, dysphagia, dental problems, itching, sneezing,  nasal congestion or discharge of excess mucus or purulent secretions, ear ache,   fever, chills, sweats, unintended wt loss or wt gain, classically pleuritic or exertional cp,  orthopnea pnd or arm/hand swelling  or leg swelling, presyncope, palpitations, abdominal  pain, anorexia, nausea, vomiting, diarrhea  or change in bowel habits or change in bladder habits, change in stools or change in urine, dysuria, hematuria,  rash, arthralgias, visual complaints, headache, numbness, weakness or ataxia or problems with walking or coordination,  change in mood or  memory.        Current Meds  Medication Sig  . amLODipine (NORVASC) 10 MG tablet Take 1 tablet (10 mg total) by mouth daily.  . Aspirin-Salicylamide-Caffeine (BC HEADACHE POWDER PO) Take 1 packet by mouth daily as needed (headaches).   Marland Kitchen atorvastatin (LIPITOR) 40 MG tablet Take 1 tablet (40 mg total) by mouth daily.  . budesonide-formoterol (SYMBICORT)  160-4.5 MCG/ACT inhaler USE 2 INHALATIONS TWICE A DAY  . calcium carbonate (OSCAL) 1500 (600 Ca) MG TABS tablet Take 600 mg by mouth daily.   . cetirizine (ZYRTEC) 10 MG tablet Take 10 mg by mouth daily as needed for allergies. Reported on 03/12/2015  . Cholecalciferol (D-3-5) 5000 units capsule Take 5,000 Units daily by mouth.  . diclofenac sodium (VOLTAREN) 1 % GEL Apply 1 application topically daily as needed for muscle pain.  . DULoxetine (CYMBALTA) 30 MG capsule Take 3 capsules (90 mg total) by mouth daily.  . feeding supplement, ENSURE ENLIVE, (ENSURE ENLIVE) LIQD Take 237 mLs by mouth 2 (two) times daily between meals.  Marland Kitchen guaiFENesin (MUCINEX) 600 MG 12 hr tablet Take 600 mg by mouth 2 (two) times daily.   Marland Kitchen HYDROcodone Bitartrate ER (HYSINGLA ER) 60 MG T24A Take 1 tablet by mouth daily.  Marland Kitchen HYDROcodone-acetaminophen (NORCO) 7.5-325 MG tablet Take 1 tablet by mouth 4 (four) times daily.  Marland Kitchen ipratropium-albuterol (DUONEB) 0.5-2.5 (3) MG/3ML SOLN Inhale 3 mLs into the lungs 4 (four) times daily as needed (for shortness of breath/wheezing). *May use as needed  . Iron Polysacch Cmplx-B12-FA 150-0.025-1 MG CAPS Take 1 capsule daily by mouth.  Marland Kitchen L-Methylfolate-Algae-B12-B6 (METANX) 3-90.314-2-35 MG CAPS Take 1 tablet by mouth 2 (two) times daily.  . magic  mouthwash w/lidocaine SOLN Take 5 mLs by mouth 3 (three) times daily.  . nicotine (NICODERM CQ - DOSED IN MG/24 HOURS) 21 mg/24hr patch Place 1 patch (21 mg total) onto the skin daily.  . Omega-3 Fatty Acids (FISH OIL) 1000 MG CAPS Take 1 capsule by mouth daily.   . OXYGEN 2lpm with sleep and as needed during the day  . pantoprazole (PROTONIX) 40 MG tablet Take 1 tablet (40 mg total) by mouth daily.  Marland Kitchen PROAIR HFA 108 (90 Base) MCG/ACT inhaler USE 2 INHALATIONS EVERY 6 HOURS AS NEEDED FOR WHEEZING OR SHORTNESS OF BREATH  . Tiotropium Bromide Monohydrate (SPIRIVA RESPIMAT) 2.5 MCG/ACT AERS Inhale 2 puffs into the lungs daily.  . tizanidine (ZANAFLEX) 6 MG capsule Take 1 capsule 3 (three) times daily by mouth.  . traZODone (DESYREL) 150 MG tablet Take 1.5-2 tablets (225-300 mg total) by mouth at bedtime.  .          .        Objective:   Physical Exam  amb wf walks with rollator  01/09/2018        105  09/21/2017        104   08/24/17 105 lb 9.6 oz (47.9 kg)  07/31/17 106 lb (48.1 kg)  07/26/17 105 lb 3.2 oz (47.7 kg)     Vital signs reviewed - Note on arrival 02 sats  94% on RA      01/09/2018  82mod/brace on R foodt     HEENT: nl dentition / oropharynx. Nl external ear canals without cough reflex -  Mild bilateral non-specific turbinate edema     NECK :  without JVD/Nodes/TM/ nl carotid upstrokes bilaterally   LUNGS: no acc muscle use,  Mod barrel  contour chest wall with bilateral  Distant bs s audible wheeze and  without cough on insp or exp maneuver and mod  Hyperresonant  to  percussion bilaterally     CV:  RRR  no s3 or murmur or increase in P2, and no edema   ABD:  soft and nontender with pos mid insp Hoover's  in the supine position. No bruits or organomegaly appreciated, bowel sounds  nl  MS:   Slow awkward gait using rollator/   ext warm without deformities, calf tenderness, cyanosis or clubbing/  R foot/ankle brace    SKIN: warm and dry without lesions     NEURO:  alert, approp, nl sensorium with  no motor or cerebellar deficits apparent other than R foot drop            Assessment:

## 2018-02-04 ENCOUNTER — Other Ambulatory Visit: Payer: Self-pay | Admitting: Family Medicine

## 2018-02-04 DIAGNOSIS — G8929 Other chronic pain: Secondary | ICD-10-CM

## 2018-02-04 DIAGNOSIS — M549 Dorsalgia, unspecified: Principal | ICD-10-CM

## 2018-02-05 NOTE — Telephone Encounter (Signed)
Can we call patient to verify her current dose?  I reached out to her via MyChart previously and never got an answer back.  I want to make sure she is on 3 capsules daily.

## 2018-02-06 NOTE — Telephone Encounter (Signed)
Spoke with pt and she states she does take 3 capsules daily.

## 2018-02-22 ENCOUNTER — Other Ambulatory Visit (HOSPITAL_COMMUNITY): Payer: Self-pay | Admitting: *Deleted

## 2018-02-25 ENCOUNTER — Other Ambulatory Visit: Payer: Self-pay | Admitting: *Deleted

## 2018-02-25 MED ORDER — TRAZODONE HCL 150 MG PO TABS: 225.0000 mg | ORAL_TABLET | Freq: Every day | ORAL | 0 refills | Status: DC

## 2018-02-25 NOTE — Telephone Encounter (Signed)
Ov 05/03/18

## 2018-02-26 ENCOUNTER — Other Ambulatory Visit (HOSPITAL_COMMUNITY): Payer: Self-pay | Admitting: *Deleted

## 2018-03-05 ENCOUNTER — Other Ambulatory Visit (HOSPITAL_COMMUNITY): Payer: Self-pay | Admitting: Emergency Medicine

## 2018-03-05 MED ORDER — IRON POLYSACCH CMPLX-B12-FA 150-0.025-1 MG PO CAPS: 1.0000 | ORAL_CAPSULE | Freq: Every day | ORAL | 1 refills | Status: DC

## 2018-03-19 ENCOUNTER — Other Ambulatory Visit (HOSPITAL_COMMUNITY)

## 2018-03-22 ENCOUNTER — Other Ambulatory Visit (HOSPITAL_COMMUNITY)

## 2018-03-26 ENCOUNTER — Ambulatory Visit (HOSPITAL_COMMUNITY): Admitting: Hematology

## 2018-05-03 ENCOUNTER — Ambulatory Visit: Admitting: Family Medicine

## 2018-05-30 ENCOUNTER — Encounter: Payer: Self-pay | Admitting: Family Medicine

## 2018-05-30 IMAGING — CT CT L SPINE W/ CM
3 of 7 series · 10 of 33 positions shown, 12 images · non-contrast
Comparison: CT 09/28/2015

CLINICAL DATA: Previous lumbar fusion. Severe right leg pain. Right
foot numbness.
TECHNIQUE: Contiguous axial images were obtained through the Lumbar spine after
the intrathecal infusion of infusion. Coronal and sagittal
reconstructions were obtained of the axial image sets.

[Series 2: l spine soft · axial · 0.31mm/px · z∈[+320,+402]mm · 2 of 82 slices shown, 3 images]
[im 28/82  soft-tissue]
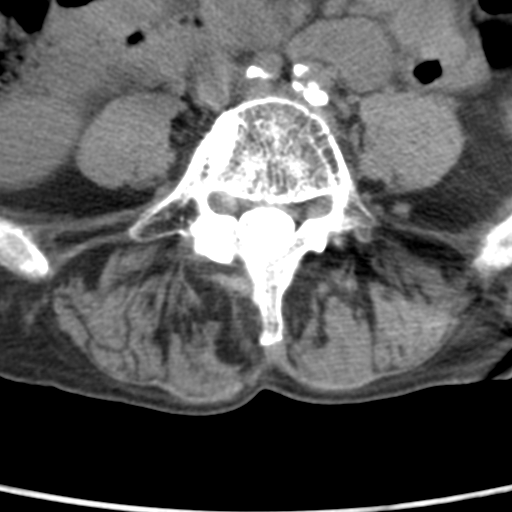
[im 28/82  bone]
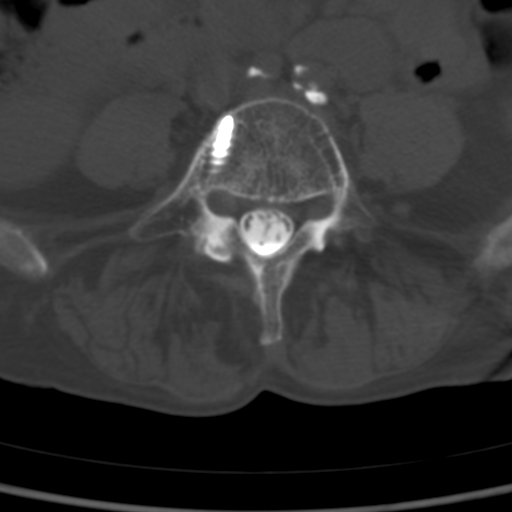
[im 55/82  bone]
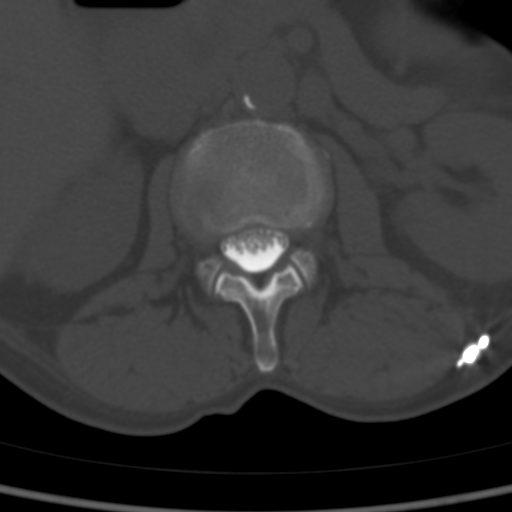

[Series 6: bone cor · coronal · 0.32mm/px · 3 of 47 slices shown]
[im 10/47  bone]
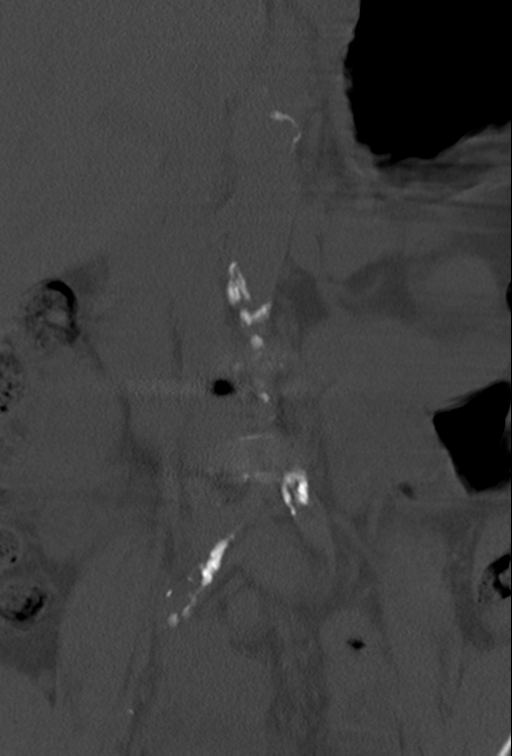
[im 19/47  bone]
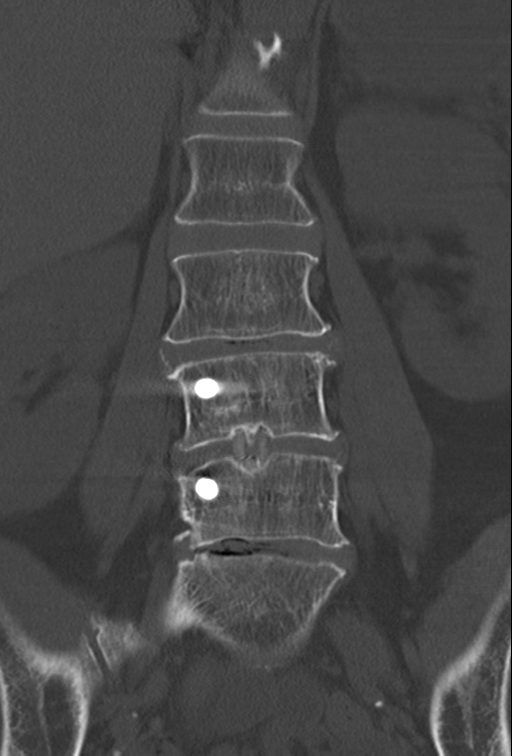
[im 28/47  bone]
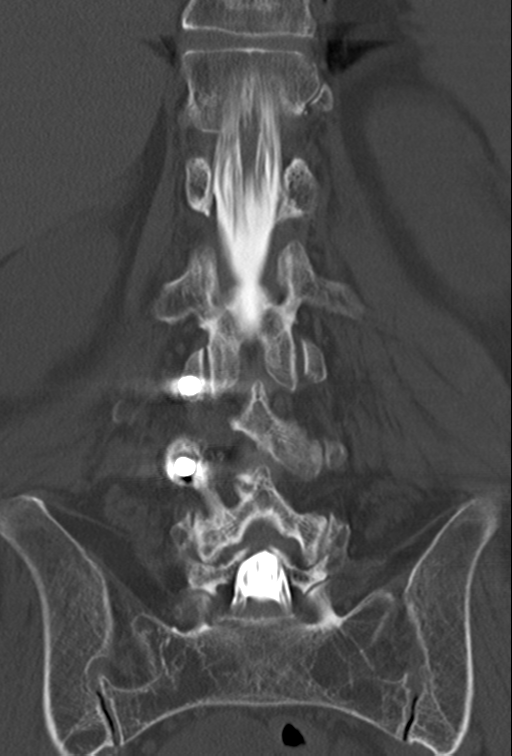

[Series 7: sag bone · sagittal · 0.27mm/px · 5 of 50 slices shown, 6 images]
[im 17/50  bone]
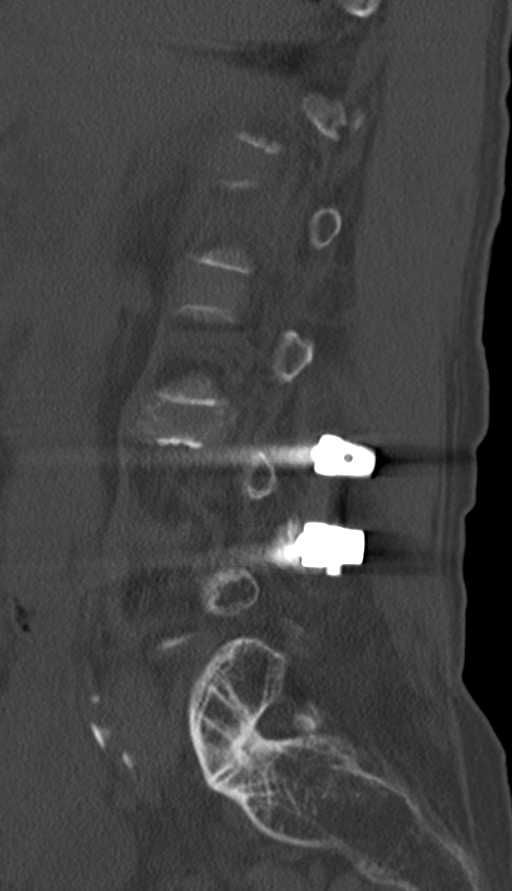
[im 21/50  bone]
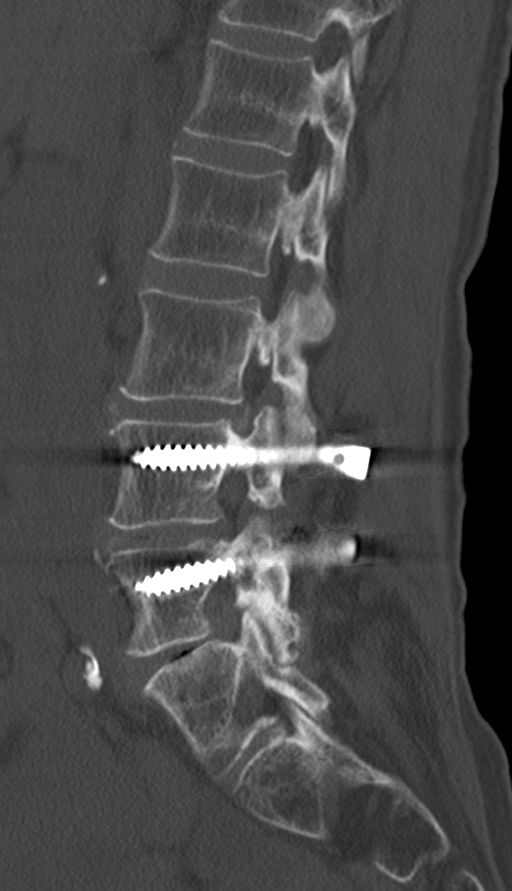
[im 25/50  soft-tissue]
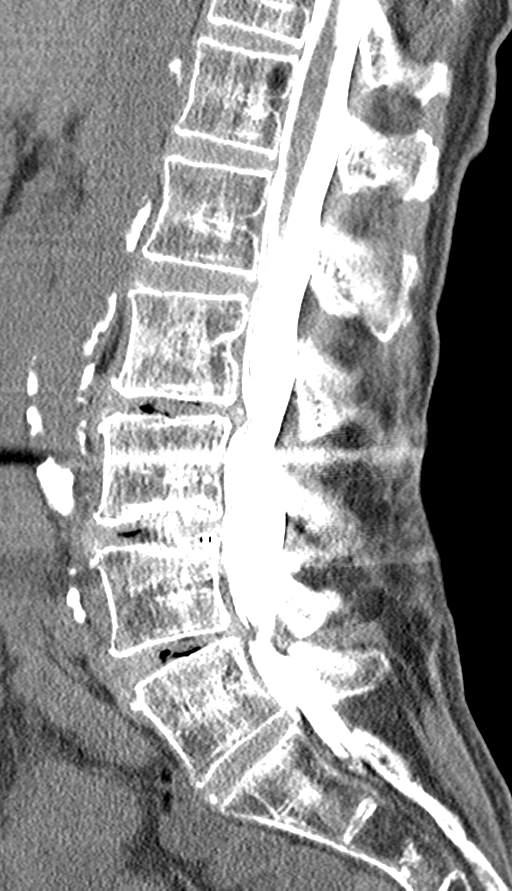
[im 25/50  bone]
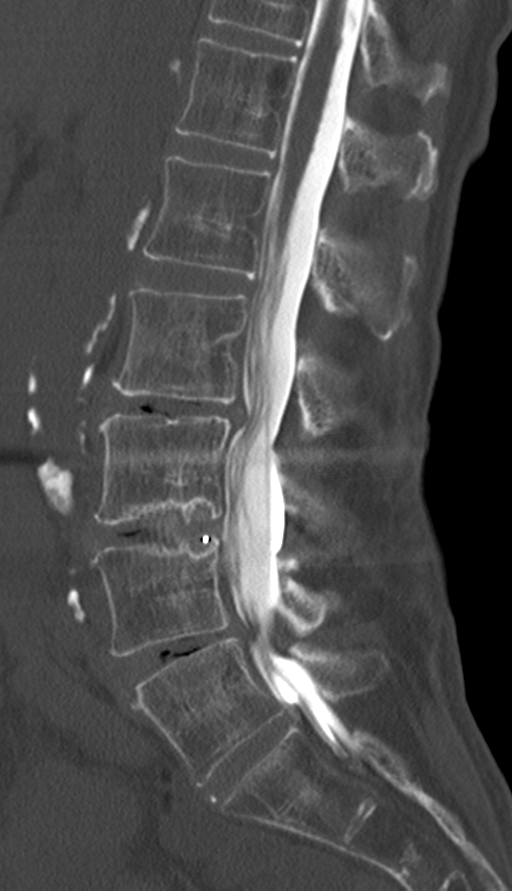
[im 29/50  bone]
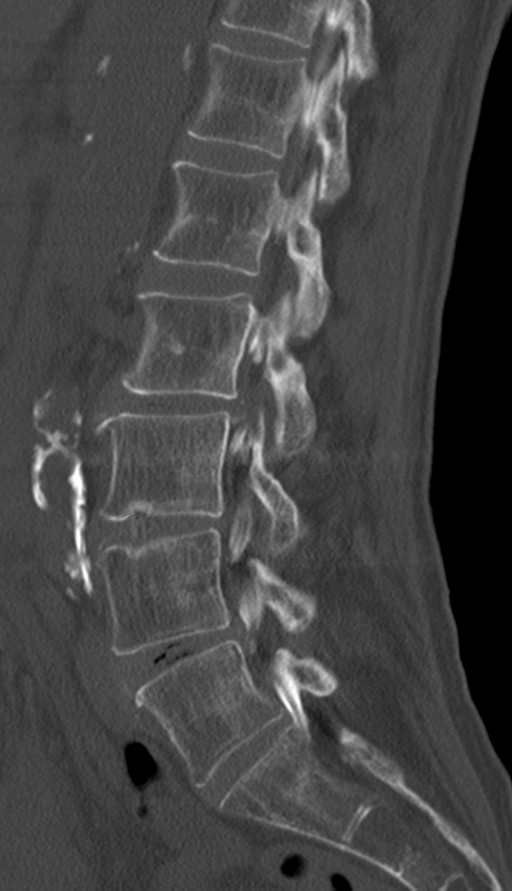
[im 33/50  bone]
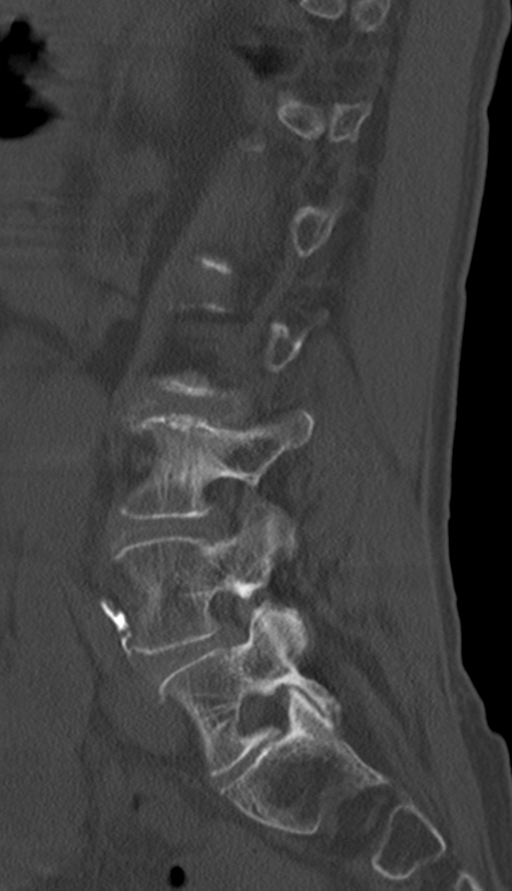

[10 of 33 positions shown; findings below may reference images not displayed]

EXAM:
LUMBAR MYELOGRAM

FLUOROSCOPY TIME:  0 minutes 51 seconds. 105.55 micro gray meter
squared

PROCEDURE:
After thorough discussion of risks and benefits of the procedure
including bleeding, infection, injury to nerves, blood vessels,
adjacent structures as well as headache and CSF leak, written and
oral informed consent was obtained. Consent was obtained by Dr. Pantaleon
Loayza. Time out form was completed.

Patient was positioned prone on the fluoroscopy table. Local
anesthesia was provided with 1% lidocaine without epinephrine after
prepped and draped in the usual sterile fashion. Puncture was
performed at L4-5 using a 3 1/2 inch 22-gauge spinal needle via left
para median approach. Using a single pass through the dura, the
needle was placed within the thecal sac, with return of clear CSF.
15 mL of Isovue 200 was injected into the thecal sac, with normal
opacification of the nerve roots and cauda equina consistent with
free flow within the subarachnoid space.

I personally performed the lumbar puncture and administered the
intrathecal contrast. I also personally performed acquisition of the
myelogram images.
FINDINGS: LUMBAR MYELOGRAM FINDINGS:

Same numbering scheme utilize as on the previous CT. L5 is described
as a transitional vertebra.

Bilateral lateral recess stenosis at L2-3, right worse than left.
Wide patency of the canal the fusion level of L3-4. Anterior
extradural defect at L4-5 with apparent compression of the right L5
nerve root.

L5-S1 is transitional and rudimentary.

Standing flexion extension films show 3 mm are retrolisthesis at
L2-3 with standing. No motion is demonstrated with bending.

CT LUMBAR MYELOGRAM FINDINGS:

T11-12: Normal.

T12-L1:  Normal.

L1-2:  Normal.

L2-3: Disc degeneration with vacuum phenomenon. Circumferential
bulging of the disc. Moderate stenosis of both lateral recesses.
Foraminal encroachment on the left by disc material could affect the
left L2 nerve root.

L3-4: Previous posterior decompression and fusion procedure.
Right-sided pedicle screws at L3 and L4. Nonunion with evidence of
motion, nitrogen gas in the disc space and along the interbody
spacer. Minimal periscrew lucency particularly notable at L4. Good
decompression with sufficient patency of the canal and foramina.

L4-5: Disc degeneration with vacuum phenomenon. Circumferential
protrusion of the disc. Stenosis of both lateral recesses right more
than left. Asymmetric disc material probably compresses the right L5
nerve root. Foraminal stenosis on the right could also affect the
exiting L4 nerve root.

L5-S1: Transitional level. No disc pathology. No facet arthropathy.
Transitional articulations are unremarkable.

Ordinary osteoarthritis of the sacroiliac joints
IMPRESSION: Same numbering scheme utilized. Previous surgery was at the L3-4
level, with L5 being transitional.

L2-3: Disc degeneration with circumferential disc protrusion.
Moderate stenosis of both lateral recesses. Foraminal stenosis on
the left because of disc material that could possibly affect the
left L2 nerve root. However, the patient does not describe
left-sided symptoms today.

L3-4: Nonunion. Evidence of motion with increased vacuum phenomenon
within the disc and along the interbody fusion material. Mild
lucency around the L4 screw.

L4-5: Circumferential disc protrusion more prominent towards the
right. Disc material focally compresses the right L5 nerve root in
the lateral recess. There is also right foraminal stenosis that
could affect the exiting L4 nerve root.

## 2018-06-19 ENCOUNTER — Encounter: Payer: Self-pay | Admitting: Internal Medicine

## 2018-07-10 ENCOUNTER — Ambulatory Visit: Payer: PRIVATE HEALTH INSURANCE | Admitting: Internal Medicine

## 2018-08-01 ENCOUNTER — Other Ambulatory Visit: Payer: Self-pay | Admitting: Family Medicine

## 2018-08-01 ENCOUNTER — Other Ambulatory Visit: Payer: Self-pay | Admitting: *Deleted

## 2018-08-01 ENCOUNTER — Encounter: Payer: Self-pay | Admitting: Family Medicine

## 2018-08-01 MED ORDER — ATORVASTATIN CALCIUM 40 MG PO TABS: 40.0000 mg | ORAL_TABLET | Freq: Every day | ORAL | 1 refills | Status: DC

## 2018-08-14 ENCOUNTER — Encounter: Payer: Self-pay | Admitting: Family Medicine

## 2018-08-14 ENCOUNTER — Other Ambulatory Visit: Payer: Self-pay | Admitting: Family Medicine

## 2018-09-09 ENCOUNTER — Other Ambulatory Visit: Payer: Self-pay | Admitting: Family Medicine

## 2018-09-09 ENCOUNTER — Other Ambulatory Visit: Payer: Self-pay | Admitting: Internal Medicine

## 2018-09-10 ENCOUNTER — Other Ambulatory Visit: Payer: Self-pay | Admitting: *Deleted

## 2018-09-10 MED ORDER — ALBUTEROL SULFATE HFA 108 (90 BASE) MCG/ACT IN AERS: INHALATION_SPRAY | RESPIRATORY_TRACT | 1 refills | Status: DC

## 2018-09-23 ENCOUNTER — Ambulatory Visit: Payer: PRIVATE HEALTH INSURANCE | Admitting: Internal Medicine

## 2018-11-20 ENCOUNTER — Encounter: Payer: Self-pay | Admitting: Family Medicine

## 2018-11-29 ENCOUNTER — Ambulatory Visit: Payer: PRIVATE HEALTH INSURANCE | Admitting: Internal Medicine

## 2018-12-02 ENCOUNTER — Ambulatory Visit (INDEPENDENT_AMBULATORY_CARE_PROVIDER_SITE_OTHER): Payer: Medicare Other | Admitting: Primary Care

## 2018-12-02 ENCOUNTER — Other Ambulatory Visit: Payer: Self-pay

## 2018-12-02 ENCOUNTER — Encounter: Payer: Self-pay | Admitting: Primary Care

## 2018-12-02 ENCOUNTER — Ambulatory Visit: Payer: PRIVATE HEALTH INSURANCE | Admitting: Internal Medicine

## 2018-12-02 VITALS — BP 136/74 | HR 97 | Temp 98.4°F | Ht 63.0 in | Wt 100.8 lb

## 2018-12-02 DIAGNOSIS — J9611 Chronic respiratory failure with hypoxia: Secondary | ICD-10-CM

## 2018-12-02 DIAGNOSIS — J449 Chronic obstructive pulmonary disease, unspecified: Secondary | ICD-10-CM

## 2018-12-02 DIAGNOSIS — Z720 Tobacco use: Secondary | ICD-10-CM | POA: Diagnosis not present

## 2018-12-02 DIAGNOSIS — Z23 Encounter for immunization: Secondary | ICD-10-CM

## 2018-12-02 NOTE — Progress Notes (Signed)
@Patient  ID: Jasmine [name], female    DOB: December 19, 1955, 63 y.o.   MRN: [id number]  No chief complaint on file.   Referring provider: Janora Norlander, DO   Brief patient profile: 89 yowf active smoker  dx with asthma as child but by HS outgrew it until her 55's with flares but better between until early 2000s and maintain on multliple inhalers and then 02/2016 hosp in [place] then [facility] April 2019 and newly started on 02 = 2lpm as needed daytime and hs and referred to pulmonary clinic 08/24/2017 by Dr. Danice Goltz.  HPI: 63 year old female, current every day smoker. PMH significant for COPD GOLD II, centrilobular emphysema, chronic respiratory failure with hypoxia, chronic back pain, chronic narcotic use, hepatic steatosis. Patient of Dr. Melvyn Novas, last seen on 01/09/18. Maintained on Symbicort 160 and Spiriva respimat 2.5. Encouraged to work on inhaler technique. Wears 2L oxygen at night and as needed. Recommended 6 month follow-up but unable   12/02/2018  Patient presents today follow-up visit. She has not been seen in 1 year. Needs ambulatory walk test today to re-qualify for oxygen. She is doing well, states that her breathing has been mostly fine. Uses oxygen at night and prn during the day. She is deconditioned d/t previous back surgeries and foot drop. Ambulating with rolling walker. Compliant with Symbicort 160 and Spiriva respimat. Doing better with technique. Uses her rescue inhaler on average once a week. Reports chest congestion, cough is baseline. She tried to take mucinex every day. Continues smoking 1ppd, she would like to cut back and eventually quit smoking. Her support system at home is her husband who occasionally smokes a cigar.    PFT's:   09/21/2017  FEV1 1.61 (72 % ) ratio 63  p 14 % improvement from saba p nothing prior to study with DLCO  55 % corrects to 62 % for alv volume    Labs: Alpha one screen 09/21/2017  MM level 191   Allergies  Allergen Reactions  . Lyrica  [Pregabalin] Other (See Comments)    EXTREME SHAKING/TREMBLING  . Darvon [Propoxyphene]     UNSPECIFIED REACTION   . Gabapentin     Extreme shaking and trembling   . Augmentin [Amoxicillin-Pot Clavulanate] Nausea And Vomiting    Has patient had a PCN reaction causing immediate rash, facial/tongue/throat swelling, SOB or lightheadedness with hypotension:no Has patient had a PCN reaction causing severe rash involving mucus membranes or skin necrosis:No Has patient had a PCN reaction that required hospitalization:No Has patient had a PCN reaction occurring within the last 10 years:Yes--ONLY N/V If all of the above answers are "NO", then may proceed with Cephalosporin use.   . Erythromycin Itching and Rash       . Vibramycin [Doxycycline Calcium] Itching and Rash    "[place] "    Immunization History  Administered Date(s) Administered  . Fluad Quad(high Dose 65+) 12/02/2018  . Influenza, Quadrivalent, Recombinant, Inj, Pf 01/16/2017  . Influenza,inj,Quad PF,6+ Mos 02/22/2015, 01/03/2016  . Influenza-Unspecified 12/04/2017    Past Medical History:  Diagnosis Date  . Allergy   . Anemia   . Anxiety   . Arthritis   . Asthma   . Carpal tunnel syndrome    bilateral  . Cigarette smoker 08/25/2017  . Complication of anesthesia    in the past has had N/V, the last few surgeries she has not been  . COPD (chronic obstructive pulmonary disease) ([place])   . Easy bruising 07/10/2016  . GERD (gastroesophageal  reflux disease)   . Headache   . Heart murmur 2005   never had any problems  . History of blood transfusion 2018  . History of kidney stones   . Hypertension   . Hypoglycemia   . Hypoglycemia   . Iron deficiency anemia 07/11/2016  . Neuromuscular disorder ([place])    pt denies  . Neuropathy    both arms  . Normocytic anemia 07/29/2015  . Pneumonia   . PONV (postoperative nausea and vomiting)   . Postoperative anemia due to acute blood loss 11/15/2015  . Sciatica   . Seizures  ([place])    as a child when she would have an asthma attack. none as an adult  . Shortness of breath dyspnea     Tobacco History: Social History   Tobacco Use  Smoking Status Current Every Day Smoker  . Packs/day: 2.00  . Years: 35.00  . Pack years: 70.00  . Types: Cigarettes  Smokeless Tobacco Never Used  Tobacco Comment   trying to quit   Ready to quit: Not Answered Counseling given: Not Answered Comment: trying to quit   Outpatient Medications Prior to Visit  Medication Sig Dispense Refill  . albuterol (PROAIR HFA) 108 (90 Base) MCG/ACT inhaler USE 2 INHALATIONS EVERY 6 HOURS AS NEEDED FOR WHEEZING OR SHORTNESS OF BREATH (Needs to be seen before next refill) 25.5 g 1  . amLODipine (NORVASC) 10 MG tablet Take 1 tablet (10 mg total) by mouth daily. 90 tablet 1  . Aspirin-Salicylamide-Caffeine (BC HEADACHE POWDER PO) Take 1 packet by mouth daily as needed (headaches).     Marland Kitchen atorvastatin (LIPITOR) 40 MG tablet Take 1 tablet (40 mg total) by mouth daily. 90 tablet 1  . budesonide-formoterol (SYMBICORT) 160-4.5 MCG/ACT inhaler USE 2 INHALATIONS TWICE A DAY 10.2 g 0  . calcium carbonate (OSCAL) 1500 (600 Ca) MG TABS tablet Take 600 mg by mouth daily.     . cetirizine (ZYRTEC) 10 MG tablet Take 10 mg by mouth daily as needed for allergies. Reported on 03/12/2015    . Cholecalciferol (D-3-5) 5000 units capsule Take 5,000 Units daily by mouth.    . diclofenac sodium (VOLTAREN) 1 % GEL Apply 1 application topically daily as needed for muscle pain.    . DULoxetine (CYMBALTA) 30 MG capsule TAKE 3 CAPSULES DAILY (MUST BE SEEN BEFORE NEXT REFILL) 270 capsule 4  . feeding supplement, ENSURE ENLIVE, (ENSURE ENLIVE) LIQD Take 237 mLs by mouth 2 (two) times daily between meals. 237 mL 12  . guaiFENesin (MUCINEX) 600 MG 12 hr tablet Take 600 mg by mouth 2 (two) times daily.     Marland Kitchen HYDROcodone Bitartrate ER (HYSINGLA ER) 60 MG T24A Take 1 tablet by mouth daily.    Marland Kitchen HYDROcodone-acetaminophen (NORCO)  7.5-325 MG tablet Take 1 tablet by mouth 4 (four) times daily.    Marland Kitchen ipratropium-albuterol (DUONEB) 0.5-2.5 (3) MG/3ML SOLN Inhale 3 mLs into the lungs 4 (four) times daily as needed (for shortness of breath/wheezing). *May use as needed    . Iron Polysacch Cmplx-B12-FA 150-0.025-1 MG CAPS Take 1 capsule by mouth daily. 90 each 1  . L-Methylfolate-Algae-B12-B6 (METANX) 3-90.314-2-35 MG CAPS Take 1 tablet by mouth 2 (two) times daily. 180 capsule 4  . magic mouthwash w/lidocaine SOLN Take 5 mLs by mouth 3 (three) times daily. 240 mL 0  . nicotine (NICODERM CQ - DOSED IN MG/24 HOURS) 21 mg/24hr patch Place 1 patch (21 mg total) onto the skin daily. 84 patch 0  .  Omega-3 Fatty Acids (FISH OIL) 1000 MG CAPS Take 1 capsule by mouth daily.     . OXYGEN 2lpm with sleep and as needed during the day    . pantoprazole (PROTONIX) 40 MG tablet Take 1 tablet (40 mg total) by mouth daily. 90 tablet 1  . SPIRIVA RESPIMAT 2.5 MCG/ACT AERS USE 2 INHALATIONS DAILY 4 g 2  . tizanidine (ZANAFLEX) 6 MG capsule Take 1 capsule 3 (three) times daily by mouth.    . traZODone (DESYREL) 150 MG tablet Take 1.5-2 tablets (225-300 mg total) by mouth at bedtime. 180 tablet 0   No facility-administered medications prior to visit.    Review of Systems  Review of Systems  Constitutional: Negative.   HENT: Positive for congestion.   Respiratory: Positive for cough and shortness of breath. Negative for wheezing.   Neurological: Positive for weakness.   Physical Exam  BP 136/74 (BP Location: Right Arm, Cuff Size: Normal)   Pulse 97   Temp 98.4 F (36.9 C) (Temporal)   Ht 5\' 3"  (1.6 m)   Wt 100 lb 12.8 oz (45.7 kg)   SpO2 95%   BMI 17.86 kg/m  Physical Exam Constitutional:      General: She is not in acute distress.    Appearance: Normal appearance. She is not ill-appearing.  HENT:     Head: Normocephalic and atraumatic.     Right Ear: Tympanic membrane normal.     Left Ear: Tympanic membrane normal.      Mouth/Throat:     Mouth: Mucous membranes are moist.     Pharynx: Oropharynx is clear.  Neck:     Musculoskeletal: Normal range of motion and neck supple.  Cardiovascular:     Rate and Rhythm: Normal rate and regular rhythm.  Pulmonary:     Effort: Pulmonary effort is normal. No respiratory distress.     Breath sounds: No wheezing.     Comments: CTA, diminished. Difficulty taking full breath  Musculoskeletal:     Comments: Ambulates with rolling walker d.t  foot drop  Skin:    General: Skin is warm and dry.  Neurological:     General: No focal deficit present.     Mental Status: She is alert and oriented to person, place, and time. Mental status is at baseline.  Psychiatric:        Mood and Affect: Mood normal.        Behavior: Behavior normal.        Thought Content: Thought content normal.        Judgment: Judgment normal.      Lab Results:  CBC    Component Value Date/Time   WBC 7.5 09/10/2017 1415   RBC 3.88 09/10/2017 1415   HGB 12.9 09/10/2017 1415   HGB 12.4 07/26/2017 1636   HCT 41.1 09/10/2017 1415   HCT 37.8 07/26/2017 1636   PLT 144 (L) 09/10/2017 1415   PLT 351 07/26/2017 1636   MCV 105.9 (H) 09/10/2017 1415   MCV 100 (H) 07/26/2017 1636   MCH 33.2 09/10/2017 1415   MCHC 31.4 09/10/2017 1415   RDW 14.9 09/10/2017 1415   RDW 15.3 07/26/2017 1636   LYMPHSABS 1.1 09/10/2017 1415   LYMPHSABS 1.8 07/26/2017 1636   MONOABS 0.4 09/10/2017 1415   EOSABS 0.1 09/10/2017 1415   EOSABS 0.1 07/26/2017 1636   BASOSABS 0.0 09/10/2017 1415   BASOSABS 0.0 07/26/2017 1636    BMET    Component Value Date/Time   NA 142 10/29/2017  1342   K 3.5 10/29/2017 1342   CL 103 10/29/2017 1342   CO2 22 10/29/2017 1342   GLUCOSE 106 (H) 10/29/2017 1342   GLUCOSE 52 (L) 09/10/2017 1415   BUN 11 10/29/2017 1342   CREATININE 0.51 (L) 10/29/2017 1342   CALCIUM 9.6 10/29/2017 1342   GFRNONAA 102 10/29/2017 1342   GFRAA 118 10/29/2017 1342    BNP No results found for: BNP   ProBNP No results found for: PROBNP  Imaging: No results found.   Assessment & Plan:   COPD GOLD II  - Stable - Continue Symbicort 160 two puffs twice daily - Continue Spiriva respimat 2.5 once daily (reviwed technique, improved 80% effectiveness) - Received high dose flu vaccine today  Chronic respiratory failure with hypoxia (HCC) - O2 95-98% RA at rest  - Ambulatory O2 walk test with no desaturations - Needs ONO on room air for history nocturnal hypoxemia   Tobacco abuse - Currently smoking 1ppd - Smoking cessation strongly encourage (taper amount and pick quit date)   Jasmine Ehrich, NP 12/02/2018

## 2018-12-02 NOTE — Assessment & Plan Note (Addendum)
-   O2 95-98% RA at rest  - Ambulatory O2 walk test with no desaturations - Needs ONO on room air for history nocturnal hypoxemia

## 2018-12-02 NOTE — Assessment & Plan Note (Addendum)
-   Currently smoking 1ppd - Smoking cessation strongly encourage (taper amount and pick quit date)

## 2018-12-02 NOTE — Assessment & Plan Note (Addendum)
-   Stable - Continue Symbicort 160 two puffs twice daily - Continue Spiriva respimat 2.5 once daily (reviwed technique, improved 80% effectiveness) - Received high dose flu vaccine today

## 2018-12-02 NOTE — Patient Instructions (Addendum)
Recommendations: Continue Symbicort twice daily Continue Spiriva once daily Take mucinex 1-2 times a day for chest congestion  Use incentive spirometer three times a day to encourage deep breathing exercise Strongly encourage smoking cessation  Office testing: Ambulatory O2 walk test today was normal   Orders: Over night oximetry on room air re: nocturnal hypoxia   Office treatment: Received high dose flu vaccine today  Follow-up: 6 months follow-up with Dr. Melvyn Novas   Steps to Quit Smoking Smoking tobacco is the leading cause of preventable death. It can affect almost every organ in the body. Smoking puts you and people around you at risk for many serious, long-lasting (chronic) diseases. Quitting smoking can be hard, but it is one of the best things that you can do for your health. It is never too late to quit. How do I get ready to quit? When you decide to quit smoking, make a plan to help you succeed. Before you quit:  Pick a date to quit. Set a date within the next 2 weeks to give you time to prepare.  Write down the reasons why you are quitting. Keep this list in places where you will see it often.  Tell your family, friends, and co-workers that you are quitting. Their support is important.  Talk with your doctor about the choices that may help you quit.  Find out if your health insurance will pay for these treatments.  Know the people, places, things, and activities that make you want to smoke (triggers). Avoid them. What first steps can I take to quit smoking?  Throw away all cigarettes at home, at work, and in your car.  Throw away the things that you use when you smoke, such as ashtrays and lighters.  Clean your car. Make sure to empty the ashtray.  Clean your home, including curtains and carpets. What can I do to help me quit smoking? Talk with your doctor about taking medicines and seeing a counselor at the same time. You are more likely to succeed when you do  both.  If you are pregnant or breastfeeding, talk with your doctor about counseling or other ways to quit smoking. Do not take medicine to help you quit smoking unless your doctor tells you to do so. To quit smoking: Quit right away  Quit smoking totally, instead of slowly cutting back on how much you smoke over a period of time.  Go to counseling. You are more likely to quit if you go to counseling sessions regularly. Take medicine You may take medicines to help you quit. Some medicines need a prescription, and some you can buy over-the-counter. Some medicines may contain a drug called nicotine to replace the nicotine in cigarettes. Medicines may:  Help you to stop having the desire to smoke (cravings).  Help to stop the problems that come when you stop smoking (withdrawal symptoms). Your doctor may ask you to use:  Nicotine patches, gum, or lozenges.  Nicotine inhalers or sprays.  Non-nicotine medicine that is taken by mouth. Find resources Find resources and other ways to help you quit smoking and remain smoke-free after you quit. These resources are most helpful when you use them often. They include:  Online chats with a Social worker.  Phone quitlines.  Printed Furniture conservator/restorer.  Support groups or group counseling.  Text messaging programs.  Mobile phone apps. Use apps on your mobile phone or tablet that can help you stick to your quit plan. There are many free apps for mobile phones  and tablets as well as websites. Examples include Quit Guide from the [employer] and smokefree.gov  What things can I do to make it easier to quit?   Talk to your family and friends. Ask them to support and encourage you.  Call a phone quitline (1-800-QUIT-NOW), reach out to support groups, or work with a Social worker.  Ask people who smoke to not smoke around you.  Avoid places that make you want to smoke, such as: ? Bars. ? Parties. ? Smoke-break areas at work.  Spend time with people who  do not smoke.  Lower the stress in your life. Stress can make you want to smoke. Try these things to help your stress: ? Getting regular exercise. ? Doing deep-breathing exercises. ? Doing yoga. ? Meditating. ? Doing a body scan. To do this, close your eyes, focus on one area of your body at a time from head to toe. Notice which parts of your body are tense. Try to relax the muscles in those areas. How will I feel when I quit smoking? Day 1 to 3 weeks Within the first 24 hours, you may start to have some problems that come from quitting tobacco. These problems are very bad 2-3 days after you quit, but they do not often last for more than 2-3 weeks. You may get these symptoms:  Mood swings.  Feeling restless, nervous, angry, or annoyed.  Trouble concentrating.  Dizziness.  Strong desire for high-sugar foods and nicotine.  Weight gain.  Trouble pooping (constipation).  Feeling like you may vomit (nausea).  Coughing or a sore throat.  Changes in how the medicines that you take for other issues work in your body.  Depression.  Trouble sleeping (insomnia). Week 3 and afterward After the first 2-3 weeks of quitting, you may start to notice more positive results, such as:  Better sense of smell and taste.  Less coughing and sore throat.  Slower heart rate.  Lower blood pressure.  Clearer skin.  Better breathing.  Fewer sick days. Quitting smoking can be hard. Do not give up if you fail the first time. Some people need to try a few times before they succeed. Do your best to stick to your quit plan, and talk with your doctor if you have any questions or concerns. Summary  Smoking tobacco is the leading cause of preventable death. Quitting smoking can be hard, but it is one of the best things that you can do for your health.  When you decide to quit smoking, make a plan to help you succeed.  Quit smoking right away, not slowly over a period of time.  When you start  quitting, seek help from your doctor, family, or friends. This information is not intended to replace advice given to you by your health care provider. Make sure you discuss any questions you have with your health care provider. Document Released: 12/17/2008 Document Revised: 05/10/2018 Document Reviewed: 05/11/2018 Elsevier Patient Education  [place].   Influenza Virus Vaccine (Flucelvax) What is this medicine? INFLUENZA VIRUS VACCINE (in floo EN zuh VAHY ruhs vak SEEN) helps to reduce the risk of getting influenza also known as the flu. The vaccine only helps protect you against some strains of the flu. This medicine may be used for other purposes; ask your health care provider or pharmacist if you have questions. COMMON BRAND NAME(S): FLUCELVAX What should I tell my health care provider before I take this medicine? They need to know if you have any of  these conditions:  bleeding disorder like hemophilia  fever or infection  Guillain-Barre syndrome or other neurological problems  immune system problems  infection with the human immunodeficiency virus (HIV) or AIDS  low blood platelet counts  multiple sclerosis  an unusual or allergic reaction to influenza virus vaccine, other medicines, foods, dyes or preservatives  pregnant or trying to get pregnant  breast-feeding How should I use this medicine? This vaccine is for injection into a muscle. It is given by a health care professional. A copy of Vaccine Information Statements will be given before each vaccination. Read this sheet carefully each time. The sheet may change frequently. Talk to your pediatrician regarding the use of this medicine in children. Special care may be needed. Overdosage: If you think you've taken too much of this medicine contact a poison control center or emergency room at once. Overdosage: If you think you have taken too much of this medicine contact a poison control center or emergency room  at once. NOTE: This medicine is only for you. Do not share this medicine with others. What if I miss a dose? This does not apply. What may interact with this medicine?  chemotherapy or radiation therapy  medicines that lower your immune system like etanercept, anakinra, infliximab, and adalimumab  medicines that treat or prevent blood clots like warfarin  phenytoin  steroid medicines like prednisone or cortisone  theophylline  vaccines This list may not describe all possible interactions. Give your health care provider a list of all the medicines, herbs, non-prescription drugs, or dietary supplements you use. Also tell them if you smoke, drink alcohol, or use illegal drugs. Some items may interact with your medicine. What should I watch for while using this medicine? Report any side effects that do not go away within 3 days to your doctor or health care professional. Call your health care provider if any unusual symptoms occur within 6 weeks of receiving this vaccine. You may still catch the flu, but the illness is not usually as bad. You cannot get the flu from the vaccine. The vaccine will not protect against colds or other illnesses that may cause fever. The vaccine is needed every year. What side effects may I notice from receiving this medicine? Side effects that you should report to your doctor or health care professional as soon as possible:  allergic reactions like skin rash, itching or hives, swelling of the face, lips, or tongue Side effects that usually do not require medical attention (Report these to your doctor or health care professional if they continue or are bothersome.):  fever  headache  muscle aches and pains  pain, tenderness, redness, or swelling at the injection site  tiredness This list may not describe all possible side effects. Call your doctor for medical advice about side effects. You may report side effects to FDA at 1-800-FDA-1088. Where should I  keep my medicine? The vaccine will be given by a health care professional in a clinic, pharmacy, doctor's office, or other health care setting. You will not be given vaccine doses to store at home. NOTE: This sheet is a summary. It may not cover all possible information. If you have questions about this medicine, talk to your doctor, pharmacist, or health care provider.  2020 Elsevier/Gold Standard (2011-02-01 14:06:47)

## 2018-12-03 ENCOUNTER — Telehealth: Payer: Self-pay

## 2018-12-03 NOTE — Telephone Encounter (Signed)
Order updated. Will route back to [facility] so they are aware.   Nothing further needed at this time.

## 2018-12-03 NOTE — Telephone Encounter (Signed)
Per [employer]:  Mariann Barter sent to Harland Dingwall, Melissa; [place], Jeanie Cooks, Stephani Police        I messaged / called - yesterday, order needs to state how it is to be done. for example ono on room air. Please have the Dr correct it or place a note in the comments on the order and let me know when that is done and I will pull it. Thanks

## 2018-12-03 NOTE — Addendum Note (Signed)
Addended by: Vivia Ewing on: 12/03/2018 09:11 AM   Modules accepted: Orders

## 2018-12-16 ENCOUNTER — Encounter: Payer: Self-pay | Admitting: Primary Care

## 2018-12-17 ENCOUNTER — Encounter: Payer: Self-pay | Admitting: Primary Care

## 2018-12-19 ENCOUNTER — Telehealth: Payer: Self-pay | Admitting: Primary Care

## 2018-12-19 DIAGNOSIS — J449 Chronic obstructive pulmonary disease, unspecified: Secondary | ICD-10-CM

## 2018-12-19 NOTE — Telephone Encounter (Signed)
ONO 12/17/18 showed O2 low 74%, baseline 83% on room air. Spent 5 hours 36 mins <88%. Renew oxygen order, needs to wear 3L at night when sleeping.

## 2018-12-20 ENCOUNTER — Telehealth: Payer: Self-pay | Admitting: Internal Medicine

## 2018-12-20 DIAGNOSIS — J449 Chronic obstructive pulmonary disease, unspecified: Secondary | ICD-10-CM

## 2018-12-20 NOTE — Telephone Encounter (Signed)
Called spoke with patient spouse Jeneen Rinks (dpr on file) and discussed ONO results as stated per [facility] NP.  Jeneen Rinks voiced his understanding.  Order sent to [employer] for requalification.  Nothing further needed; will sign off.

## 2018-12-20 NOTE — Telephone Encounter (Signed)
She needs to be on 3L

## 2018-12-20 NOTE — Telephone Encounter (Signed)
Great, thank you.  Order placed to reflect this. Called [employer] and spoke with Levada Dy to make her aware of this.    Nothing further needed; will sign off.

## 2018-12-20 NOTE — Telephone Encounter (Signed)
Beth please advise.  Care coordination note said 2L initially, but I changed this to 3L to match the ONO results.  ONO results say 3L.  When I spoke with spouse earlier to discuss results, did verify with him that patient wears 3L.

## 2018-12-24 ENCOUNTER — Telehealth: Payer: Self-pay | Admitting: Internal Medicine

## 2018-12-24 NOTE — Telephone Encounter (Signed)
atc patient, unable to reach. Left detailed message per DPR , let patient know per [employer] we did not need to do anything further from our office.

## 2019-01-05 ENCOUNTER — Emergency Department (HOSPITAL_COMMUNITY): Payer: Medicare Other

## 2019-01-05 ENCOUNTER — Inpatient Hospital Stay (HOSPITAL_COMMUNITY)
Admission: EM | Admit: 2019-01-05 | Discharge: 2019-01-10 | DRG: 439 | Disposition: A | Discharge status: Home / Self Care | Payer: Medicare Other | Attending: Family Medicine | Admitting: Family Medicine

## 2019-01-05 ENCOUNTER — Encounter (HOSPITAL_COMMUNITY): Payer: Self-pay

## 2019-01-05 ENCOUNTER — Other Ambulatory Visit: Payer: Self-pay

## 2019-01-05 DIAGNOSIS — K760 Fatty (change of) liver, not elsewhere classified: Secondary | ICD-10-CM | POA: Diagnosis present

## 2019-01-05 DIAGNOSIS — Z20828 Contact with and (suspected) exposure to other viral communicable diseases: Secondary | ICD-10-CM | POA: Diagnosis present

## 2019-01-05 DIAGNOSIS — R109 Unspecified abdominal pain: Secondary | ICD-10-CM

## 2019-01-05 DIAGNOSIS — J432 Centrilobular emphysema: Secondary | ICD-10-CM | POA: Diagnosis present

## 2019-01-05 DIAGNOSIS — F419 Anxiety disorder, unspecified: Secondary | ICD-10-CM | POA: Diagnosis present

## 2019-01-05 DIAGNOSIS — E876 Hypokalemia: Secondary | ICD-10-CM | POA: Diagnosis present

## 2019-01-05 DIAGNOSIS — K859 Acute pancreatitis without necrosis or infection, unspecified: Secondary | ICD-10-CM | POA: Diagnosis not present

## 2019-01-05 DIAGNOSIS — G894 Chronic pain syndrome: Secondary | ICD-10-CM | POA: Diagnosis present

## 2019-01-05 DIAGNOSIS — G8929 Other chronic pain: Secondary | ICD-10-CM

## 2019-01-05 DIAGNOSIS — F1190 Opioid use, unspecified, uncomplicated: Secondary | ICD-10-CM | POA: Diagnosis present

## 2019-01-05 DIAGNOSIS — F17210 Nicotine dependence, cigarettes, uncomplicated: Secondary | ICD-10-CM | POA: Diagnosis present

## 2019-01-05 DIAGNOSIS — Z79899 Other long term (current) drug therapy: Secondary | ICD-10-CM

## 2019-01-05 DIAGNOSIS — E46 Unspecified protein-calorie malnutrition: Secondary | ICD-10-CM | POA: Diagnosis present

## 2019-01-05 DIAGNOSIS — Z681 Body mass index (BMI) 19 or less, adult: Secondary | ICD-10-CM

## 2019-01-05 DIAGNOSIS — Z833 Family history of diabetes mellitus: Secondary | ICD-10-CM

## 2019-01-05 DIAGNOSIS — Z8349 Family history of other endocrine, nutritional and metabolic diseases: Secondary | ICD-10-CM

## 2019-01-05 DIAGNOSIS — Z800 Family history of malignant neoplasm of digestive organs: Secondary | ICD-10-CM

## 2019-01-05 DIAGNOSIS — F329 Major depressive disorder, single episode, unspecified: Secondary | ICD-10-CM | POA: Diagnosis present

## 2019-01-05 DIAGNOSIS — Z720 Tobacco use: Secondary | ICD-10-CM | POA: Diagnosis present

## 2019-01-05 DIAGNOSIS — K8689 Other specified diseases of pancreas: Secondary | ICD-10-CM | POA: Diagnosis present

## 2019-01-05 DIAGNOSIS — Z90710 Acquired absence of both cervix and uterus: Secondary | ICD-10-CM

## 2019-01-05 DIAGNOSIS — I10 Essential (primary) hypertension: Secondary | ICD-10-CM | POA: Diagnosis present

## 2019-01-05 DIAGNOSIS — Z79891 Long term (current) use of opiate analgesic: Secondary | ICD-10-CM

## 2019-01-05 DIAGNOSIS — K219 Gastro-esophageal reflux disease without esophagitis: Secondary | ICD-10-CM | POA: Diagnosis present

## 2019-01-05 DIAGNOSIS — Z8249 Family history of ischemic heart disease and other diseases of the circulatory system: Secondary | ICD-10-CM

## 2019-01-05 DIAGNOSIS — M549 Dorsalgia, unspecified: Secondary | ICD-10-CM

## 2019-01-05 HISTORY — DX: Acute pancreatitis without necrosis or infection, unspecified: K85.90

## 2019-01-05 LAB — URINALYSIS, ROUTINE W REFLEX MICROSCOPIC
Bilirubin Urine: NEGATIVE
Glucose, UA: NEGATIVE mg/dL
Hgb urine dipstick: NEGATIVE
Ketones, ur: 80 mg/dL — AB
Leukocytes,Ua: NEGATIVE
Nitrite: NEGATIVE
Protein, ur: 30 mg/dL — AB
Specific Gravity, Urine: 1.015 (ref 1.005–1.030)
pH: 6 (ref 5.0–8.0)

## 2019-01-05 LAB — DIFFERENTIAL
Abs Immature Granulocytes: 0.16 10*3/uL — ABNORMAL HIGH (ref 0.00–0.07)
Basophils Absolute: 0.1 10*3/uL (ref 0.0–0.1)
Basophils Relative: 0 %
Eosinophils Absolute: 0.1 10*3/uL (ref 0.0–0.5)
Eosinophils Relative: 0 %
Immature Granulocytes: 1 %
Lymphocytes Relative: 5 %
Lymphs Abs: 0.8 10*3/uL (ref 0.7–4.0)
Monocytes Absolute: 1.5 10*3/uL — ABNORMAL HIGH (ref 0.1–1.0)
Monocytes Relative: 10 %
Neutro Abs: 13.4 10*3/uL — ABNORMAL HIGH (ref 1.7–7.7)
Neutrophils Relative %: 84 %

## 2019-01-05 LAB — COMPREHENSIVE METABOLIC PANEL
ALT: 24 U/L (ref 0–44)
AST: 25 U/L (ref 15–41)
Albumin: 3.6 g/dL (ref 3.5–5.0)
Alkaline Phosphatase: 94 U/L (ref 38–126)
Anion gap: 20 — ABNORMAL HIGH (ref 5–15)
BUN: 11 mg/dL (ref 8–23)
CO2: 26 mmol/L (ref 22–32)
Calcium: 9.8 mg/dL (ref 8.9–10.3)
Chloride: 90 mmol/L — ABNORMAL LOW (ref 98–111)
Creatinine, Ser: 0.62 mg/dL (ref 0.44–1.00)
GFR calc Af Amer: >60 mL/min (ref >60)
GFR calc non Af Amer: >60 mL/min (ref >60)
Glucose, Bld: 121 mg/dL — ABNORMAL HIGH (ref 70–99)
Potassium: 2.4 mmol/L — CL (ref 3.5–5.1)
Sodium: 136 mmol/L (ref 135–145)
Total Bilirubin: 1.6 mg/dL — ABNORMAL HIGH (ref 0.3–1.2)
Total Protein: 7.3 g/dL (ref 6.5–8.1)

## 2019-01-05 LAB — ETHANOL: Alcohol, Ethyl (B): <10 mg/dL (ref <10)

## 2019-01-05 LAB — CBC
HCT: 43.4 % (ref 36.0–46.0)
Hemoglobin: 13.8 g/dL (ref 12.0–15.0)
MCH: 33.3 pg (ref 26.0–34.0)
MCHC: 31.8 g/dL (ref 30.0–36.0)
MCV: 104.8 fL — ABNORMAL HIGH (ref 80.0–100.0)
Platelets: 252 10*3/uL (ref 150–400)
RBC: 4.14 MIL/uL (ref 3.87–5.11)
RDW: 12.8 % (ref 11.5–15.5)
WBC: 16.2 10*3/uL — ABNORMAL HIGH (ref 4.0–10.5)
nRBC: 0 % (ref 0.0–0.2)

## 2019-01-05 LAB — LACTIC ACID, PLASMA
Lactic Acid, Venous: 1.2 mmol/L (ref 0.5–1.9)
Lactic Acid, Venous: 0.8 mmol/L (ref 0.5–1.9)

## 2019-01-05 LAB — MAGNESIUM: Magnesium: 2.1 mg/dL (ref 1.7–2.4)

## 2019-01-05 LAB — LIPASE, BLOOD: Lipase: 191 U/L — ABNORMAL HIGH (ref 11–51)

## 2019-01-05 MED ORDER — KCL IN DEXTROSE-NACL 40-5-0.9 MEQ/L-%-% IV SOLN
INTRAVENOUS | Status: AC
  Filled 2019-01-05: qty 2000

## 2019-01-05 MED ORDER — SODIUM CHLORIDE 0.9 % IV BOLUS
1000.0000 mL | Freq: Once | INTRAVENOUS | Status: AC
  Administered 2019-01-05: 1000 mL via INTRAVENOUS

## 2019-01-05 MED ORDER — IOHEXOL 300 MG/ML  SOLN
100.0000 mL | Freq: Once | INTRAMUSCULAR | Status: AC | PRN
  Administered 2019-01-05: 100 mL via INTRAVENOUS

## 2019-01-05 MED ORDER — HYDROMORPHONE HCL 1 MG/ML IJ SOLN
1.0000 mg | Freq: Once | INTRAMUSCULAR | Status: AC
  Administered 2019-01-05: 1 mg via INTRAVENOUS
  Filled 2019-01-05: qty 1

## 2019-01-05 MED ORDER — POTASSIUM CHLORIDE CRYS ER 20 MEQ PO TBCR
40.0000 meq | EXTENDED_RELEASE_TABLET | Freq: Once | ORAL | Status: AC
  Administered 2019-01-05: 40 meq via ORAL
  Filled 2019-01-05: qty 2

## 2019-01-05 MED ORDER — ACETAMINOPHEN 650 MG RE SUPP: 650.0000 mg | Freq: Four times a day (QID) | RECTAL | Status: DC | PRN

## 2019-01-05 MED ORDER — TIOTROPIUM BROMIDE MONOHYDRATE 2.5 MCG/ACT IN AERS: 2.5000 | INHALATION_SPRAY | Freq: Every day | RESPIRATORY_TRACT | Status: DC

## 2019-01-05 MED ORDER — PANTOPRAZOLE SODIUM 40 MG IV SOLR
40.0000 mg | INTRAVENOUS | Status: DC
  Administered 2019-01-06 – 2019-01-09 (×4): 40 mg via INTRAVENOUS
  Filled 2019-01-05 (×4): qty 40

## 2019-01-05 MED ORDER — POLYETHYLENE GLYCOL 3350 17 G PO PACK: 17.0000 g | PACK | Freq: Every day | ORAL | Status: DC | PRN

## 2019-01-05 MED ORDER — ORAL CARE MOUTH RINSE
15.0000 mL | Freq: Two times a day (BID) | OROMUCOSAL | Status: DC
  Administered 2019-01-06 – 2019-01-10 (×9): 15 mL via OROMUCOSAL

## 2019-01-05 MED ORDER — PROMETHAZINE HCL 25 MG/ML IJ SOLN
12.5000 mg | Freq: Four times a day (QID) | INTRAMUSCULAR | Status: DC | PRN
  Administered 2019-01-06 – 2019-01-07 (×4): 12.5 mg via INTRAVENOUS
  Filled 2019-01-05 (×5): qty 1

## 2019-01-05 MED ORDER — ACETAMINOPHEN 325 MG PO TABS: 650.0000 mg | ORAL_TABLET | Freq: Four times a day (QID) | ORAL | Status: DC | PRN

## 2019-01-05 MED ORDER — ENOXAPARIN SODIUM 40 MG/0.4ML ~~LOC~~ SOLN
40.0000 mg | SUBCUTANEOUS | Status: DC
  Administered 2019-01-06 – 2019-01-10 (×5): 40 mg via SUBCUTANEOUS
  Filled 2019-01-05 (×5): qty 0.4

## 2019-01-05 MED ORDER — FLUTICASONE FUROATE-VILANTEROL 200-25 MCG/INH IN AEPB
1.0000 | INHALATION_SPRAY | Freq: Every day | RESPIRATORY_TRACT | Status: DC
  Administered 2019-01-07 – 2019-01-10 (×4): 1 via RESPIRATORY_TRACT
  Filled 2019-01-05: qty 28

## 2019-01-05 MED ORDER — HYDROMORPHONE HCL 1 MG/ML IJ SOLN
1.0000 mg | INTRAMUSCULAR | Status: DC | PRN
  Administered 2019-01-06 – 2019-01-10 (×27): 1 mg via INTRAVENOUS
  Filled 2019-01-05 (×28): qty 1

## 2019-01-05 MED ORDER — LORAZEPAM 0.5 MG PO TABS
0.5000 mg | ORAL_TABLET | Freq: Once | ORAL | Status: AC
  Administered 2019-01-05: 0.5 mg via ORAL
  Filled 2019-01-05: qty 1

## 2019-01-05 MED ORDER — LABETALOL HCL 5 MG/ML IV SOLN
5.0000 mg | INTRAVENOUS | Status: DC | PRN
  Administered 2019-01-06: 5 mg via INTRAVENOUS
  Filled 2019-01-05: qty 4

## 2019-01-05 MED ORDER — KCL IN DEXTROSE-NACL 40-5-0.9 MEQ/L-%-% IV SOLN
INTRAVENOUS | Status: AC
  Administered 2019-01-05 – 2019-01-06 (×2): via INTRAVENOUS

## 2019-01-05 MED ORDER — AMLODIPINE BESYLATE 5 MG PO TABS
10.0000 mg | ORAL_TABLET | Freq: Every day | ORAL | Status: DC
  Administered 2019-01-06 – 2019-01-10 (×5): 10 mg via ORAL
  Filled 2019-01-05 (×5): qty 2

## 2019-01-05 MED ORDER — AMLODIPINE BESYLATE 5 MG PO TABS
10.0000 mg | ORAL_TABLET | Freq: Once | ORAL | Status: AC
  Administered 2019-01-05: 10 mg via ORAL
  Filled 2019-01-05: qty 2

## 2019-01-05 MED ORDER — IPRATROPIUM-ALBUTEROL 0.5-2.5 (3) MG/3ML IN SOLN: 3.0000 mL | Freq: Four times a day (QID) | RESPIRATORY_TRACT | Status: DC | PRN

## 2019-01-05 MED ORDER — DULOXETINE HCL 60 MG PO CPEP
90.0000 mg | ORAL_CAPSULE | Freq: Every day | ORAL | Status: DC
  Administered 2019-01-06 – 2019-01-10 (×5): 90 mg via ORAL
  Filled 2019-01-05 (×6): qty 1

## 2019-01-05 MED ORDER — POTASSIUM CHLORIDE 10 MEQ/100ML IV SOLN
10.0000 meq | INTRAVENOUS | Status: AC
  Administered 2019-01-05 (×3): 10 meq via INTRAVENOUS
  Filled 2019-01-05 (×2): qty 100

## 2019-01-05 NOTE — ED Provider Notes (Signed)
[facility] EMERGENCY DEPARTMENT Provider Note   CSN: [id number] Arrival date & time: 01/05/19  1416     History   Chief Complaint Chief Complaint  Patient presents with   Abdominal Pain    HPI Jasmine [name] is a 63 y.o. female with history of COPD, GERD, hypertension, chronic narcotic use for chronic back pain presents for evaluation of acute onset, constant epigastric abdominal pain for 5 days.  Reports symptoms began on Wednesday.  Pain is sharp, severe, worsens with movement, deep inspiration.  Radiates to the back and right upper quadrant of the abdomen.  Notes associated nausea and has had a few episodes of nonbloody nonbilious emesis.  Has not had a bowel movement since Wednesday.  Denies recent travel, known sick contacts, suspicious food intake, or recent treatment with antibiotics.  She has been taking her home narcotic medicines without relief of symptoms.  Is also tried an antacid and anti-gas medication over-the-counter without relief.  Denies fever, chest pain or shortness of breath.  Reports this does not feel like a COPD exacerbation.  She smokes around a pack of cigarettes daily, denies recreational drug use or excessive alcohol intake.  She is status post hysterectomy, no other abdominal surgeries.     The history is provided by the patient.    Past Medical History:  Diagnosis Date   Allergy    Anemia    Anxiety    Arthritis    Asthma    Carpal tunnel syndrome    bilateral   Cigarette smoker 11/04/5174   Complication of anesthesia    in the past has had N/V, the last few surgeries she has not been   COPD (chronic obstructive pulmonary disease) ([place])    Easy bruising 07/10/2016   GERD (gastroesophageal reflux disease)    Headache    Heart murmur 2005   never had any problems   History of blood transfusion 2018   History of kidney stones    Hypertension    Hypoglycemia    Hypoglycemia    Iron deficiency anemia 07/11/2016   Neuromuscular  disorder ([place])    pt denies   Neuropathy    both arms   Normocytic anemia 07/29/2015   Pancreatitis    Pneumonia    PONV (postoperative nausea and vomiting)    Postoperative anemia due to acute blood loss 11/15/2015   Sciatica    Seizures ([place])    as a child when she would have an asthma attack. 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   Tobacco abuse 08/04/2016   Iron deficiency anemia 07/11/2016   Easy bruising 07/10/2016   Lumbar stenosis 11/11/2015   Leg pain 09/24/2015   Vitamin D deficiency 07/29/2015   Chronic back pain 02/22/2015   HTN (hypertension) 02/22/2015   Chronic narcotic use 02/22/2015   COPD GOLD II  02/22/2015   Headache 02/22/2015    Past Surgical History:  Procedure Laterality Date   ABDOMINAL HYSTERECTOMY  1607   APPLICATION OF ROBOTIC ASSISTANCE FOR SPINAL  PROCEDURE N/A 09/05/2016   Procedure: APPLICATION OF ROBOTIC ASSISTANCE FOR SPINAL PROCEDURE;  Surgeon: Consuella Lose, MD;  Location: [place];  Service: Neurosurgery;  Laterality: N/A;   CERVICAL FUSION     CESAREAN SECTION  1982   COLONOSCOPY     cyst removal face     disk repair     neck and lumbar region   FOOT SURGERY Bilateral    to file bones down    HARDWARE REMOVAL N/A 09/05/2016   Procedure: HARDWARE REMOVAL AND REPLACEMENT OF LUMBAR FIVE SCREWS;  Surgeon: Consuella Lose, MD;  Location: [place];  Service: Neurosurgery;  Laterality: N/A;   IMPLANTATION /  PLACEMENT EPIDURAL NEUROSTIMULATOR ELECTRODES     LAMINECTOMY     2006   LUMBAR FUSION     SPINAL CORD STIMULATOR BATTERY EXCHANGE     SPINAL CORD STIMULATOR INSERTION N/A 07/05/2016   Procedure: REPLACEMENT OF LUMBAR SPINAL CORD STIMULATOR BATTERY;  Surgeon: Newman Pies, MD;  Location: [place];  Service: Neurosurgery;  Laterality: N/A;   TONSILLECTOMY       OB History   No obstetric history on file.      Home Medications    Prior to Admission medications   Medication Sig Start Date End Date Taking? Authorizing Provider  albuterol (PROAIR HFA) 108 (90 Base) MCG/ACT inhaler USE 2 INHALATIONS EVERY 6 HOURS AS NEEDED FOR WHEEZING OR SHORTNESS OF BREATH (Needs to be seen before next refill) 09/10/18  Yes Gottschalk, Ashly M, DO  amLODipine (NORVASC) 10 MG tablet Take 1 tablet (10 mg total) by mouth daily. 12/10/17  Yes Gottschalk, Leatrice Jewels M, DO  atorvastatin (LIPITOR) 40 MG tablet Take 1 tablet (40 mg total) by mouth daily. 08/01/18  Yes Ronnie Doss M, DO  calcium carbonate (OSCAL) 1500 (600 Ca) MG TABS tablet Take 600 mg by mouth daily.    Yes [provider]  Cholecalciferol (D-3-5) 5000 units capsule Take 5,000 Units daily by mouth.   Yes [provider]  diclofenac sodium (VOLTAREN) 1 % GEL Apply 1 application topically daily as needed for muscle pain. 06/30/16  Yes [provider]  DULoxetine (CYMBALTA) 30 MG capsule TAKE 3 CAPSULES DAILY (MUST BE SEEN BEFORE NEXT REFILL) 02/11/18  Yes Gottschalk, Leatrice Jewels M, DO  feeding supplement, ENSURE ENLIVE, (ENSURE ENLIVE) LIQD Take 237 mLs by mouth 2 (two) times daily between meals. 07/04/17  Yes Nita Sells, MD  HYDROcodone Bitartrate ER (HYSINGLA ER) 60 MG T24A Take 1 tablet by mouth daily.   Yes [provider]  HYDROcodone-acetaminophen (NORCO) 7.5-325 MG tablet Take 1 tablet by mouth 4 (four) times daily.   Yes [provider]  Iron Polysacch Cmplx-B12-FA 150-0.025-1 MG CAPS Take 1  capsule by mouth daily. 03/05/18  Yes Derek Jack, MD  L-Methylfolate-Algae-B12-B6 [facility]) 3-90.314-2-35 MG CAPS Take 1 tablet by mouth 2 (two) times daily. 10/31/17  Yes Gottschalk, Leatrice Jewels M, DO  Omega-3 Fatty Acids (FISH OIL) 1000 MG CAPS Take 1 capsule by mouth daily.    Yes [provider]  OXYGEN 2lpm with sleep and as needed during the day   Yes [provider]  pantoprazole (PROTONIX) 40 MG tablet Take 1 tablet (40 mg total) by mouth daily. 12/10/17  Yes Gottschalk, Leatrice Jewels M, DO  SPIRIVA RESPIMAT 2.5 MCG/ACT AERS USE 2 INHALATIONS DAILY 09/09/18  Yes Tanda Rockers, MD  tizanidine (ZANAFLEX) 6 MG capsule Take 1 capsule 3 (three) times daily by mouth. 11/27/16  Yes [provider]  Aspirin-Salicylamide-Caffeine (BC HEADACHE POWDER  PO) Take 1 packet by mouth daily as needed (headaches).     [provider]  budesonide-formoterol (SYMBICORT) 160-4.5 MCG/ACT inhaler USE 2 INHALATIONS TWICE A DAY 01/29/17   Timmothy Euler, MD  cetirizine (ZYRTEC) 10 MG tablet Take 10 mg by mouth daily as needed for allergies. Reported on 03/12/2015    [provider]  guaiFENesin (MUCINEX) 600 MG 12 hr tablet Take 600 mg by mouth 2 (two) times daily.     [provider]  ipratropium-albuterol (DUONEB) 0.5-2.5 (3) MG/3ML SOLN Inhale 3 mLs into the lungs 4 (four) times daily as needed (for shortness of breath/wheezing). *May use as needed 02/21/16   [provider]  magic mouthwash w/lidocaine SOLN Take 5 mLs by mouth 3 (three) times daily. 07/12/17   Terald Sleeper, PA-C  nicotine (NICODERM CQ - DOSED IN MG/24 HOURS) 21 mg/24hr patch Place 1 patch (21 mg total) onto the skin daily. 07/09/17   Janora Norlander, DO  traZODone (DESYREL) 150 MG tablet Take 1.5-2 tablets (225-300 mg total) by mouth at bedtime. Patient not taking: Reported on 01/05/2019 02/25/18   Janora Norlander, DO    Family History Family History  Problem Relation Age of Onset     Heart disease Mother    Diabetes Mother    Hypertension Mother    Hyperlipidemia Mother    COPD Mother        smoked   COPD Father    Diabetes Father    Heart disease Father    Cancer Father        throat   Dementia Father    Asthma Son    Hypertension Sister    Hyperlipidemia Sister    Hypertension Brother    Hyperlipidemia Brother    Hypertension Brother    Hyperlipidemia Brother    Emphysema Maternal Grandmother        smoked    Social History Social History   Tobacco Use   Smoking status: Current Every Day Smoker    Packs/day: 2.00    Years: 35.00    Pack years: 70.00    Types: Cigarettes   Smokeless tobacco: Never Used   Tobacco comment: trying to quit  Substance Use Topics   Alcohol use: Yes    Alcohol/week: 2.0 standard drinks    Types: 2 Glasses of wine per week    Comment: occasionally   Drug use: No     Allergies   Lyrica [pregabalin], Darvon [propoxyphene], Gabapentin, Augmentin [amoxicillin-pot clavulanate], Erythromycin, and Vibramycin [doxycycline calcium]   Review of Systems Review of Systems  Constitutional: Negative for chills and fever.  Respiratory: Negative for shortness of breath.   Cardiovascular: Negative for chest pain.  Gastrointestinal: Positive for abdominal pain, constipation, nausea and vomiting. Negative for diarrhea.  Genitourinary: Negative for dysuria, frequency, hematuria and urgency.  All other systems reviewed and are negative.    Physical Exam Updated Vital Signs BP (!) 166/104    Pulse (!) 109    Temp 98.7 F (37.1 C) (Oral)    Resp 19    Ht 5\' 3"  (1.6 m)    Wt 45.4 kg    SpO2 94%    BMI 17.71 kg/m   Physical Exam Vitals signs and nursing note reviewed.  Constitutional:      General: She is not in acute distress.    Appearance: She is well-developed.     Comments: Appears older than stated age, sitting upright appears uncomfortable  HENT:  Head: Normocephalic and atraumatic.   Eyes:     General:        Right eye: No discharge.        Left eye: No discharge.     Conjunctiva/sclera: Conjunctivae normal.  Neck:     Vascular: No JVD.     Trachea: No tracheal deviation.  Cardiovascular:     Rate and Rhythm: Regular rhythm. Tachycardia present.     Heart sounds: Normal heart sounds.  Pulmonary:     Effort: Pulmonary effort is normal.     Breath sounds: Normal breath sounds.  Abdominal:     General: Abdomen is flat. Bowel sounds are decreased. There is no distension.     Palpations: Abdomen is soft.     Tenderness: There is abdominal tenderness in the right upper quadrant, epigastric area and left upper quadrant. There is guarding. There is no right CVA tenderness, left CVA tenderness or rebound. Negative signs include Murphy's sign.     Comments: Maximally tender to palpation in the epigastric region.  Skin:    General: Skin is warm and dry.     Findings: No erythema.  Neurological:     Mental Status: She is alert.  Psychiatric:        Behavior: Behavior normal.      ED Treatments / Results  Labs (all labs ordered are listed, but only abnormal results are displayed) Labs Reviewed  LIPASE, BLOOD - Abnormal; Notable for the following components:      Result Value   Lipase 191 (*)    All other components within normal limits  COMPREHENSIVE METABOLIC PANEL - Abnormal; Notable for the following components:   Potassium 2.4 (*)    Chloride 90 (*)    Glucose, Bld 121 (*)    Total Bilirubin 1.6 (*)    Anion gap 20 (*)    All other components within normal limits  CBC - Abnormal; Notable for the following components:   WBC 16.2 (*)    MCV 104.8 (*)    All other components within normal limits  URINALYSIS, ROUTINE W REFLEX MICROSCOPIC - Abnormal; Notable for the following components:   Ketones, ur 80 (*)    Protein, ur 30 (*)    Bacteria, UA RARE (*)    All other components within normal limits  DIFFERENTIAL - Abnormal; Notable for the following  components:   Neutro Abs 13.4 (*)    Monocytes Absolute 1.5 (*)    Abs Immature Granulocytes 0.16 (*)    All other components within normal limits  SARS CORONAVIRUS 2 (TAT 6-24 HRS)  MAGNESIUM  LACTIC ACID, PLASMA  LACTIC ACID, PLASMA  ETHANOL  LIPID PANEL  HIV ANTIBODY (ROUTINE TESTING W REFLEX)  COMPREHENSIVE METABOLIC PANEL  CBC    EKG EKG Interpretation  Date/Time:  Sunday January 05 2019 14:45:05 EST Ventricular Rate:  122 PR Interval:    QRS Duration: 78 QT Interval:  428 QTC Calculation: 609 R Axis:   59 Text Interpretation: ** Critical Test Result: Long QTc ** Suspect unspecified pacemaker failure Accelerated Junctional rhythm Left ventricular hypertrophy with repolarization abnormality Abnormal ECG Confirmed by Nat Christen [phone]) on 01/05/2019 2:49:25 PM   Radiology Ct Abdomen Pelvis W Contrast  Result Date: 01/05/2019 CLINICAL DATA:  Abdominal pain.  Pancreatitis suspected. EXAM: CT ABDOMEN AND PELVIS WITH CONTRAST TECHNIQUE: Multidetector CT imaging of the abdomen and pelvis was performed using the standard protocol following bolus administration of intravenous contrast. CONTRAST:  116mL OMNIPAQUE IOHEXOL 300 MG/ML  SOLN COMPARISON:  04/09/2017 FINDINGS: Lower chest: Lung bases are clear. No effusions. Heart is normal size. Hepatobiliary: Severe diffuse fatty infiltration of the liver. Gallbladder is distended. Common bile duct dilated measuring up to 14 mm. Intrahepatic biliary ductal dilatation. No visible ductal stones. Pancreas: There is pancreatic ductal dilatation measuring up to 8 mm in the pancreatic head. There is inflammation around the pancreas, best seen around the pancreatic head and uncinate process most compatible with acute pancreatitis. Spleen: No focal abnormality.  Normal size. Adrenals/Urinary Tract: Small scattered cysts in the kidneys bilaterally. No hydronephrosis. Urinary bladder unremarkable. Stomach/Bowel: Large stool burden throughout the colon.  No evidence of bowel obstruction. Vascular/Lymphatic: Aortic atherosclerosis. No enlarged abdominal or pelvic lymph nodes. Reproductive: Prior hysterectomy.  No adnexal masses. Other: No free fluid or free air. Musculoskeletal: No acute bony abnormality. IMPRESSION: Stranding noted around the pancreas, most pronounced around the pancreatic head and uncinate process. Findings most compatible with acute pancreatitis. Dilated intrahepatic and extrahepatic biliary ducts as well as the pancreatic duct. Severe diffuse fatty infiltration of the liver. Small bilateral renal cysts. Aortic atherosclerosis. Electronically Signed   By: Rolm Baptise M.D.   On: 01/05/2019 19:51    Procedures .Critical Care Performed by: Renita Papa, PA-C Authorized by: Renita Papa, PA-C   Critical care provider statement:    Critical care time (minutes):  45   Critical care was necessary to treat or prevent imminent or life-threatening deterioration of the following conditions:  Metabolic crisis   Critical care was time spent personally by me on the following activities:  Discussions with consultants, evaluation of patient's response to treatment, examination of patient, ordering and performing treatments and interventions, ordering and review of laboratory studies, ordering and review of radiographic studies, pulse oximetry, re-evaluation of patient's condition, obtaining history from patient or surrogate and review of old charts   (including critical care time)  Medications Ordered in ED Medications  HYDROmorphone (DILAUDID) injection 1 mg (has no administration in time range)  amLODipine (NORVASC) tablet 10 mg (has no administration in time range)  fluticasone furoate-vilanterol (BREO ELLIPTA) 200-25 MCG/INH 1 puff (has no administration in time range)  DULoxetine (CYMBALTA) DR capsule 90 mg (has no administration in time range)  ipratropium-albuterol (DUONEB) 0.5-2.5 (3) MG/3ML nebulizer solution 3 mL (has no  administration in time range)  Tiotropium Bromide Monohydrate AERS 2.5 Act (has no administration in time range)  enoxaparin (LOVENOX) injection 40 mg (has no administration in time range)  acetaminophen (TYLENOL) tablet 650 mg (has no administration in time range)    Or  acetaminophen (TYLENOL) suppository 650 mg (has no administration in time range)  polyethylene glycol (MIRALAX / GLYCOLAX) packet 17 g (has no administration in time range)  dextrose 5 % and 0.9 % NaCl with KCl 40 mEq/L infusion (has no administration in time range)  pantoprazole (PROTONIX) injection 40 mg (has no administration in time range)  labetalol (NORMODYNE) injection 5 mg (has no administration in time range)  promethazine (PHENERGAN) injection 12.5 mg (has no administration in time range)  potassium chloride SA (KLOR-CON) CR tablet 40 mEq (40 mEq Oral Given 01/05/19 1614)  sodium chloride 0.9 % bolus 1,000 mL (0 mLs Intravenous Stopped 01/05/19 1746)  HYDROmorphone (DILAUDID) injection 1 mg (1 mg Intravenous Given 01/05/19 1646)  LORazepam (ATIVAN) tablet 0.5 mg (0.5 mg Oral Given 01/05/19 1646)  potassium chloride 10 mEq in 100 mL IVPB (0 mEq Intravenous Stopped 01/05/19 2045)  amLODipine (NORVASC) tablet 10 mg (10 mg Oral  Given 01/05/19 1755)  HYDROmorphone (DILAUDID) injection 1 mg (1 mg Intravenous Given 01/05/19 1755)  iohexol (OMNIPAQUE) 300 MG/ML solution 100 mL (100 mLs Intravenous Contrast Given 01/05/19 1922)  HYDROmorphone (DILAUDID) injection 1 mg (1 mg Intravenous Given 01/05/19 2054)  dextrose 5 % and 0.9 % NaCl with KCl 40 mEq/L 40-5-0.9 MEQ/L-%-% infusion (has no administration in time range)     Initial Impression / Assessment and Plan / ED Course  I have reviewed the triage vital signs and the nursing notes.  Pertinent labs & imaging results that were available during my care of the patient were reviewed by me and considered in my medical decision making (see chart for details).        Patient  presenting for evaluation of progressively worsening epigastric abdominal pain with nausea, vomiting, constipation.  She is afebrile, persistently tachycardic and hypertensive in the ED.  Appears quite uncomfortable.  No peritoneal signs on examination of the abdomen.  Lab work today reviewed by me shows leukocytosis of 16.2, no renal insufficiency.  Her potassium is low today at 2.4, likely in the setting of persistent vomiting.  Her initial EKG does show prolonged QT so she was given Ativan for her nausea.  Repeat EKG with persistent artifact due to her spinal cord stimulator however does show improved QTC.  Magnesium is within normal limits.  Will give p.o. and IV potassium.  UA shows evidence of dehydration with ketonuria and proteinuria but no evidence of UTI or nephrolithiasis.  Her lipase today is elevated at 191.  CT of the abdomen and pelvis shows evidence most compatible with acute pancreatitis.  Her pain has been difficult to control in the ED but she did have some improvement with Dilaudid.  She denies excessive alcohol intake at home, no history of diabetes.  Spoke with Dr. Denton Brick with [facility] hospitalist service who agrees to assume care of patient and bring her into the hospital for further evaluation and management.  Final Clinical Impressions(s) / ED Diagnoses   Final diagnoses:  Acute pancreatitis without infection or necrosis, unspecified pancreatitis type    ED Discharge Orders    None       Debroah Baller 01/05/19 2227    Lucrezia Starch, MD 01/06/19 1246

## 2019-01-05 NOTE — ED Notes (Signed)
Report given to floor rn. Will discuss bp with hospitalist

## 2019-01-05 NOTE — ED Triage Notes (Signed)
Pt reports constant abd pain, n/v x4 days. LNBM on Wednesday. Pt reports has not been able to keep anything down since Wednesday. Pt denies any changes in stool color, denies diarrhea, or rectal bleeding.

## 2019-01-05 NOTE — ED Notes (Signed)
hosp in room

## 2019-01-05 NOTE — H&P (Signed)
History and Physical    Jasmine [name] [id number] DOB: 1955-10-31 DOA: 01/05/2019  PCP: Janora Norlander, DO   Patient coming from: Home  I have personally briefly reviewed patient's old medical records in [place]  Chief Complaint: Abdominal pain  HPI: Jasmine [name] is a 63 y.o. female with medical history significant for  COPD and asthma, hypertension.  Patient presented to the ED with complaints of upper abdominal pain of 4 days duration.  She also reports episodes of vomiting-no coffee grounds, no blood, with barely any p.o. intake in the past 4 days.  Last bowel movement was 4 days ago. No fever no chills.  No loose stools.  She reports once a week intake of alcoholic beverages usually cocktails bourbon mixed with Dr. Malachi Bonds.  ED Course: Tachycardic to 123, blood pressure systolic 702O to 378H, O2 sats greater than 95% on room air.  Lipase elevated 191.  Potassium low 2.4.  Normal magnesium 2.1.  Normal lactic acid 1.2.  UA positive for ketones and proteins with rare bacteria.  Abdominal CT with contrast-findings suggestive of acute pancreatitis with intrahepatic and extrahepatic biliary duct dilatation, pancreatic duct dilatation.  Fatty liver.  Hospitalist admit for acute pancreatitis  Review of Systems: As per HPI all other systems reviewed and negative.  Past Medical History:  Diagnosis Date  . Allergy   . Anemia   . Anxiety   . Arthritis   . Asthma   . Carpal tunnel syndrome    bilateral  . Cigarette smoker 08/25/2017  . Complication of anesthesia    in the past has had N/V, the last few surgeries she has not been  . COPD (chronic obstructive pulmonary disease) ([place])   . Easy bruising 07/10/2016  . GERD (gastroesophageal reflux disease)   . Headache   . Heart murmur 2005   never had any problems  . History of blood transfusion 2018  . History of kidney stones   . Hypertension   . Hypoglycemia   . Hypoglycemia   . Iron deficiency anemia 07/11/2016  .  Neuromuscular disorder ([place])    pt denies  . Neuropathy    both arms  . Normocytic anemia 07/29/2015  . Pneumonia   . PONV (postoperative nausea and vomiting)   . Postoperative anemia due to acute blood loss 11/15/2015  . Sciatica   . Seizures ([place])    as a child when she would have an asthma attack. none as an adult  . Shortness of breath dyspnea     Past Surgical History:  Procedure Laterality Date  . ABDOMINAL HYSTERECTOMY  2005  . APPLICATION OF ROBOTIC ASSISTANCE FOR SPINAL PROCEDURE N/A 09/05/2016   Procedure: APPLICATION OF ROBOTIC ASSISTANCE FOR SPINAL PROCEDURE;  Surgeon: Consuella Lose, MD;  Location: [facility];  Service: Neurosurgery;  Laterality: N/A;  . CERVICAL FUSION    . [place]  . COLONOSCOPY    . cyst removal face    . disk repair     neck and lumbar region  . FOOT SURGERY Bilateral    to file bones down   . HARDWARE REMOVAL N/A 09/05/2016   Procedure: HARDWARE REMOVAL AND REPLACEMENT OF LUMBAR FIVE SCREWS;  Surgeon: Consuella Lose, MD;  Location: [facility];  Service: Neurosurgery;  Laterality: N/A;  . IMPLANTATION / PLACEMENT EPIDURAL NEUROSTIMULATOR ELECTRODES    . LAMINECTOMY     2006  . LUMBAR FUSION    . SPINAL CORD STIMULATOR BATTERY EXCHANGE    .  SPINAL CORD STIMULATOR INSERTION N/A 07/05/2016   Procedure: REPLACEMENT OF LUMBAR SPINAL CORD STIMULATOR BATTERY;  Surgeon: Newman Pies, MD;  Location: [facility];  Service: Neurosurgery;  Laterality: N/A;  . TONSILLECTOMY       reports that she has been smoking cigarettes. She has a 70.00 pack-year smoking history. She has never used smokeless tobacco. She reports current alcohol use of about 2.0 standard drinks of alcohol per week. She reports that she does not use drugs.  Allergies  Allergen Reactions  . Lyrica [Pregabalin] Other (See Comments)    EXTREME SHAKING/TREMBLING  . Darvon [Propoxyphene]     UNSPECIFIED REACTION   . Gabapentin     Extreme shaking and trembling   . Augmentin  [Amoxicillin-Pot Clavulanate] Nausea And Vomiting    Has patient had a PCN reaction causing immediate rash, facial/tongue/throat swelling, SOB or lightheadedness with hypotension:no Has patient had a PCN reaction causing severe rash involving mucus membranes or skin necrosis:No Has patient had a PCN reaction that required hospitalization:No Has patient had a PCN reaction occurring within the last 10 years:Yes--ONLY N/V If all of the above answers are "NO", then may proceed with Cephalosporin use.   . Erythromycin Itching and Rash       . Vibramycin [Doxycycline Calcium] Itching and Rash    "SPLOTCHING "    Family History  Problem Relation Age of Onset  . Heart disease Mother   . Diabetes Mother   . Hypertension Mother   . Hyperlipidemia Mother   . COPD Mother        smoked  . COPD Father   . Diabetes Father   . Heart disease Father   . Cancer Father        throat  . Dementia Father   . Asthma Son   . Hypertension Sister   . Hyperlipidemia Sister   . Hypertension Brother   . Hyperlipidemia Brother   . Hypertension Brother   . Hyperlipidemia Brother   . Emphysema Maternal Grandmother        smoked    Prior to Admission medications   Medication Sig Start Date End Date Taking? Authorizing Provider  albuterol (PROAIR HFA) 108 (90 Base) MCG/ACT inhaler USE 2 INHALATIONS EVERY 6 HOURS AS NEEDED FOR WHEEZING OR SHORTNESS OF BREATH (Needs to be seen before next refill) 09/10/18  Yes Gottschalk, Ashly M, DO  amLODipine (NORVASC) 10 MG tablet Take 1 tablet (10 mg total) by mouth daily. 12/10/17  Yes Gottschalk, Leatrice Jewels M, DO  atorvastatin (LIPITOR) 40 MG tablet Take 1 tablet (40 mg total) by mouth daily. 08/01/18  Yes Ronnie Doss M, DO  calcium carbonate (OSCAL) 1500 (600 Ca) MG TABS tablet Take 600 mg by mouth daily.    Yes [provider]  Cholecalciferol (D-3-5) 5000 units capsule Take 5,000 Units daily by mouth.   Yes [provider]  diclofenac sodium  (VOLTAREN) 1 % GEL Apply 1 application topically daily as needed for muscle pain. 06/30/16  Yes [provider]  DULoxetine (CYMBALTA) 30 MG capsule TAKE 3 CAPSULES DAILY (MUST BE SEEN BEFORE NEXT REFILL) 02/11/18  Yes Gottschalk, Leatrice Jewels M, DO  feeding supplement, ENSURE ENLIVE, (ENSURE ENLIVE) LIQD Take 237 mLs by mouth 2 (two) times daily between meals. 07/04/17  Yes Nita Sells, MD  HYDROcodone Bitartrate ER (HYSINGLA ER) 60 MG T24A Take 1 tablet by mouth daily.   Yes [provider]  HYDROcodone-acetaminophen (NORCO) 7.5-325 MG tablet Take 1 tablet by mouth 4 (four) times daily.  Yes [provider]  Iron Polysacch Cmplx-B12-FA 150-0.025-1 MG CAPS Take 1 capsule by mouth daily. 03/05/18  Yes Derek Jack, MD  L-Methylfolate-Algae-B12-B6 [facility]) 3-90.314-2-35 MG CAPS Take 1 tablet by mouth 2 (two) times daily. 10/31/17  Yes Gottschalk, Leatrice Jewels M, DO  Omega-3 Fatty Acids (FISH OIL) 1000 MG CAPS Take 1 capsule by mouth daily.    Yes [provider]  OXYGEN 2lpm with sleep and as needed during the day   Yes [provider]  pantoprazole (PROTONIX) 40 MG tablet Take 1 tablet (40 mg total) by mouth daily. 12/10/17  Yes Gottschalk, Leatrice Jewels M, DO  SPIRIVA RESPIMAT 2.5 MCG/ACT AERS USE 2 INHALATIONS DAILY 09/09/18  Yes Tanda Rockers, MD  tizanidine (ZANAFLEX) 6 MG capsule Take 1 capsule 3 (three) times daily by mouth. 11/27/16  Yes [provider]  Aspirin-Salicylamide-Caffeine (BC HEADACHE POWDER PO) Take 1 packet by mouth daily as needed (headaches).     [provider]  budesonide-formoterol (SYMBICORT) 160-4.5 MCG/ACT inhaler USE 2 INHALATIONS TWICE A DAY 01/29/17   Timmothy Euler, MD  cetirizine (ZYRTEC) 10 MG tablet Take 10 mg by mouth daily as needed for allergies. Reported on 03/12/2015    [provider]  guaiFENesin (MUCINEX) 600 MG 12 hr tablet Take 600 mg by mouth 2 (two) times daily.     [provider]   ipratropium-albuterol (DUONEB) 0.5-2.5 (3) MG/3ML SOLN Inhale 3 mLs into the lungs 4 (four) times daily as needed (for shortness of breath/wheezing). *May use as needed 02/21/16   [provider]  magic mouthwash w/lidocaine SOLN Take 5 mLs by mouth 3 (three) times daily. 07/12/17   Terald Sleeper, PA-C  nicotine (NICODERM CQ - DOSED IN MG/24 HOURS) 21 mg/24hr patch Place 1 patch (21 mg total) onto the skin daily. 07/09/17   Janora Norlander, DO  traZODone (DESYREL) 150 MG tablet Take 1.5-2 tablets (225-300 mg total) by mouth at bedtime. Patient not taking: Reported on 01/05/2019 02/25/18   Janora Norlander, DO    Physical Exam: Vitals:   01/05/19 1733 01/05/19 1734 01/05/19 1900 01/05/19 2000  BP: (!) 193/108  (!) 192/111 (!) 172/105  Pulse: (!) 114  73 (!) 109  Resp: 16 15 17 17   Temp:      TempSrc:      SpO2: 95%  100% 94%  Weight:      Height:        Constitutional: NAD, calm, comfortable Vitals:   01/05/19 1733 01/05/19 1734 01/05/19 1900 01/05/19 2000  BP: (!) 193/108  (!) 192/111 (!) 172/105  Pulse: (!) 114  73 (!) 109  Resp: 16 15 17 17   Temp:      TempSrc:      SpO2: 95%  100% 94%  Weight:      Height:       Eyes: PERRL, lids and conjunctivae normal ENMT: Mucous membranes are moist. Posterior pharynx clear of any exudate or lesions.Normal dentition.  Neck: normal, supple, no masses, no thyromegaly Respiratory: Bilateral reduced air entry, no wheezing, no crackles. Normal respiratory effort. No accessory muscle use.  Cardiovascular: Tachycardic but regular rate and rhythm, no murmurs / rubs / gallops. No extremity edema. 2+ pedal pulses.  Abdomen: Moderate epigastric tenderness mostly upper abdomen, with guarding, no masses palpated. No hepatosplenomegaly. Bowel sounds positive.  Musculoskeletal: no clubbing / cyanosis. No joint deformity upper and lower extremities. Good ROM, no contractures. Normal muscle tone.  Skin: no rashes, lesions, ulcers. No  induration  Neurologic: CN 2-12 grossly intact. Strength 5/5 in all 4.  Psychiatric: Normal judgment and insight. Alert and oriented x 3. Normal mood.   Labs on Admission: I have personally reviewed following labs and imaging studies  CBC: Recent Labs  Lab 01/05/19 1454  WBC 16.2*  NEUTROABS 13.4*  HGB 13.8  HCT 43.4  MCV 104.8*  PLT 010   Basic Metabolic Panel: Recent Labs  Lab 01/05/19 1454  NA 136  K 2.4*  CL 90*  CO2 26  GLUCOSE 121*  BUN 11  CREATININE 0.62  CALCIUM 9.8  MG 2.1   Liver Function Tests: Recent Labs  Lab 01/05/19 1454  AST 25  ALT 24  ALKPHOS 94  BILITOT 1.6*  PROT 7.3  ALBUMIN 3.6   Recent Labs  Lab 01/05/19 1454  LIPASE 191*   Urine analysis:    Component Value Date/Time   COLORURINE YELLOW 01/05/2019 1943   APPEARANCEUR CLEAR 01/05/2019 1943   APPEARANCEUR Clear 07/26/2017 1636   LABSPEC 1.015 01/05/2019 1943   PHURINE 6.0 01/05/2019 1943   GLUCOSEU NEGATIVE 01/05/2019 [place] NEGATIVE 01/05/2019 [place] NEGATIVE 01/05/2019 [place] Negative 07/26/2017 1636   KETONESUR 80 (A) 01/05/2019 1943   PROTEINUR 30 (A) 01/05/2019 1943   NITRITE NEGATIVE 01/05/2019 1943   LEUKOCYTESUR NEGATIVE 01/05/2019 1943    Radiological Exams on Admission: Ct Abdomen Pelvis W Contrast  Result Date: 01/05/2019 CLINICAL DATA:  Abdominal pain.  Pancreatitis suspected. EXAM: CT ABDOMEN AND PELVIS WITH CONTRAST TECHNIQUE: Multidetector CT imaging of the abdomen and pelvis was performed using the standard protocol following bolus administration of intravenous contrast. CONTRAST:  166mL OMNIPAQUE IOHEXOL 300 MG/ML  SOLN COMPARISON:  04/09/2017 FINDINGS: Lower chest: Lung bases are clear. No effusions. Heart is normal size. Hepatobiliary: Severe diffuse fatty infiltration of the liver. Gallbladder is distended. Common bile duct dilated measuring up to 14 mm. Intrahepatic biliary ductal dilatation. No visible ductal stones. Pancreas:  There is pancreatic ductal dilatation measuring up to 8 mm in the pancreatic head. There is inflammation around the pancreas, best seen around the pancreatic head and uncinate process most compatible with acute pancreatitis. Spleen: No focal abnormality.  Normal size. Adrenals/Urinary Tract: Small scattered cysts in the kidneys bilaterally. No hydronephrosis. Urinary bladder unremarkable. Stomach/Bowel: Large stool burden throughout the colon. No evidence of bowel obstruction. Vascular/Lymphatic: Aortic atherosclerosis. No enlarged abdominal or pelvic lymph nodes. Reproductive: Prior hysterectomy.  No adnexal masses. Other: No free fluid or free air. Musculoskeletal: No acute bony abnormality. IMPRESSION: Stranding noted around the pancreas, most pronounced around the pancreatic head and uncinate process. Findings most compatible with acute pancreatitis. Dilated intrahepatic and extrahepatic biliary ducts as well as the pancreatic duct. Severe diffuse fatty infiltration of the liver. Small bilateral renal cysts. Aortic atherosclerosis. Electronically Signed   By: Rolm Baptise M.D.   On: 01/05/2019 19:51    EKG: Independently reviewed.  Artifacts present from neurostimulator implant in patient's back for chronic back pain.  Otherwise EKG shows sinus tachycardia. Prolonged QTc.  Assessment/Plan Active Problems:   Acute pancreatitis   Acute pancreatitis- abdominal pain, vomiting, tachycardic, leukocytosis 16.2. Abdominal CT shows acute pancreatitis, intra and extrahepatic biliary ducts and pancreatic duct dilatation.  But liver enzymes are within normal limits.  Reports once weekly alcoholic beverage intake. -Pain control with IV Dilaudid, on chronic opioids, likely high pain tolerance. -1 L bolus normal saline given in ED, D5 N/s + 40KCl 125cc/hr x 15hrs -N.p.o. -IV Protonix 40 daily - ??  Need for right upper quadrant ultrasound -Will consult gastroenterology -Lipid panel a.m. -CMP, CBC a.m. -IV  Phenergan as needed -Blood alcohol level.  Hypokalemia-potassium 2.4.  Normal magnesium 2.1.  From poor p.o. intake and vomiting. -Replete  Hypertension-elevated, ? secondary to pain -Resume home Norvasc - PRN labetalol  Hx of COPD/asthma-stable.  Reports she is on 2 L of O2 chronically day and night. -Resume home bronchodilators -resume home O2.  Prolonged QTC- 492, likely secondary to hypokalemia.  She is on SSRIs.  Fatty liver-severe and diffuse as seen on abdominal CT.  With BMI 17. -Hold atorvastatin for now with poor p.o. intake.  Anxiety/depression/chronic pain-resume home Cymbalta.  She denies a history of seizures, she was told that she has "severe shaking" not seizures.  She is not on medications for this. -Hold home opioids  DVT prophylaxis: Lovenox Code Status: Full code Family Communication: None at bedside Disposition Plan: Per rounding team Consults called: Gastroenterology Admission status: Observation, telemetry   Genevie Elman Arlyce Dice MD [facility]  01/05/2019, 9:47 PM

## 2019-01-05 NOTE — ED Notes (Signed)
Pt has neuro stimulator implant in her back, this is causing artifact on ekg,  edp notified.

## 2019-01-05 NOTE — ED Notes (Signed)
Date and time results received: 01/05/19 3:46 PM    Test: 2.4 Critical Value: k+  Name of Provider Notified: Dr Roslynn Amble   Orders Received? Or Actions Taken?:

## 2019-01-06 ENCOUNTER — Encounter (HOSPITAL_COMMUNITY): Payer: Self-pay

## 2019-01-06 ENCOUNTER — Observation Stay (HOSPITAL_COMMUNITY): Payer: Medicare Other

## 2019-01-06 DIAGNOSIS — E876 Hypokalemia: Secondary | ICD-10-CM | POA: Diagnosis present

## 2019-01-06 DIAGNOSIS — Z79899 Other long term (current) drug therapy: Secondary | ICD-10-CM | POA: Diagnosis not present

## 2019-01-06 DIAGNOSIS — G8929 Other chronic pain: Secondary | ICD-10-CM | POA: Diagnosis not present

## 2019-01-06 DIAGNOSIS — M5441 Lumbago with sciatica, right side: Secondary | ICD-10-CM | POA: Diagnosis not present

## 2019-01-06 DIAGNOSIS — Z833 Family history of diabetes mellitus: Secondary | ICD-10-CM | POA: Diagnosis not present

## 2019-01-06 DIAGNOSIS — E46 Unspecified protein-calorie malnutrition: Secondary | ICD-10-CM | POA: Diagnosis present

## 2019-01-06 DIAGNOSIS — Z681 Body mass index (BMI) 19 or less, adult: Secondary | ICD-10-CM | POA: Diagnosis not present

## 2019-01-06 DIAGNOSIS — Z800 Family history of malignant neoplasm of digestive organs: Secondary | ICD-10-CM | POA: Diagnosis not present

## 2019-01-06 DIAGNOSIS — K8689 Other specified diseases of pancreas: Secondary | ICD-10-CM | POA: Diagnosis present

## 2019-01-06 DIAGNOSIS — Z8249 Family history of ischemic heart disease and other diseases of the circulatory system: Secondary | ICD-10-CM | POA: Diagnosis not present

## 2019-01-06 DIAGNOSIS — I10 Essential (primary) hypertension: Secondary | ICD-10-CM | POA: Diagnosis present

## 2019-01-06 DIAGNOSIS — Z90710 Acquired absence of both cervix and uterus: Secondary | ICD-10-CM | POA: Diagnosis not present

## 2019-01-06 DIAGNOSIS — Z8349 Family history of other endocrine, nutritional and metabolic diseases: Secondary | ICD-10-CM | POA: Diagnosis not present

## 2019-01-06 DIAGNOSIS — Z79891 Long term (current) use of opiate analgesic: Secondary | ICD-10-CM | POA: Diagnosis not present

## 2019-01-06 DIAGNOSIS — F17210 Nicotine dependence, cigarettes, uncomplicated: Secondary | ICD-10-CM | POA: Diagnosis present

## 2019-01-06 DIAGNOSIS — K859 Acute pancreatitis without necrosis or infection, unspecified: Secondary | ICD-10-CM | POA: Diagnosis present

## 2019-01-06 DIAGNOSIS — K8500 Idiopathic acute pancreatitis without necrosis or infection: Secondary | ICD-10-CM | POA: Diagnosis not present

## 2019-01-06 DIAGNOSIS — F329 Major depressive disorder, single episode, unspecified: Secondary | ICD-10-CM | POA: Diagnosis present

## 2019-01-06 DIAGNOSIS — Z20828 Contact with and (suspected) exposure to other viral communicable diseases: Secondary | ICD-10-CM | POA: Diagnosis present

## 2019-01-06 DIAGNOSIS — G894 Chronic pain syndrome: Secondary | ICD-10-CM | POA: Diagnosis present

## 2019-01-06 DIAGNOSIS — K219 Gastro-esophageal reflux disease without esophagitis: Secondary | ICD-10-CM | POA: Diagnosis present

## 2019-01-06 DIAGNOSIS — J432 Centrilobular emphysema: Secondary | ICD-10-CM | POA: Diagnosis present

## 2019-01-06 DIAGNOSIS — F419 Anxiety disorder, unspecified: Secondary | ICD-10-CM | POA: Diagnosis present

## 2019-01-06 DIAGNOSIS — K760 Fatty (change of) liver, not elsewhere classified: Secondary | ICD-10-CM | POA: Diagnosis present

## 2019-01-06 LAB — CBC
HCT: 39 % (ref 36.0–46.0)
Hemoglobin: 12.3 g/dL (ref 12.0–15.0)
MCH: 32.8 pg (ref 26.0–34.0)
MCHC: 31.5 g/dL (ref 30.0–36.0)
MCV: 104 fL — ABNORMAL HIGH (ref 80.0–100.0)
Platelets: 237 10*3/uL (ref 150–400)
RBC: 3.75 MIL/uL — ABNORMAL LOW (ref 3.87–5.11)
RDW: 12.9 % (ref 11.5–15.5)
WBC: 14.4 10*3/uL — ABNORMAL HIGH (ref 4.0–10.5)
nRBC: 0 % (ref 0.0–0.2)

## 2019-01-06 LAB — COMPREHENSIVE METABOLIC PANEL
ALT: 22 U/L (ref 0–44)
AST: 22 U/L (ref 15–41)
Albumin: 3.1 g/dL — ABNORMAL LOW (ref 3.5–5.0)
Alkaline Phosphatase: 83 U/L (ref 38–126)
Anion gap: 14 (ref 5–15)
BUN: 6 mg/dL — ABNORMAL LOW (ref 8–23)
CO2: 27 mmol/L (ref 22–32)
Calcium: 8.5 mg/dL — ABNORMAL LOW (ref 8.9–10.3)
Chloride: 95 mmol/L — ABNORMAL LOW (ref 98–111)
Creatinine, Ser: 0.39 mg/dL — ABNORMAL LOW (ref 0.44–1.00)
GFR calc Af Amer: >60 mL/min (ref >60)
GFR calc non Af Amer: >60 mL/min (ref >60)
Glucose, Bld: 197 mg/dL — ABNORMAL HIGH (ref 70–99)
Potassium: 2.6 mmol/L — CL (ref 3.5–5.1)
Sodium: 136 mmol/L (ref 135–145)
Total Bilirubin: 1 mg/dL (ref 0.3–1.2)
Total Protein: 6.3 g/dL — ABNORMAL LOW (ref 6.5–8.1)

## 2019-01-06 LAB — MAGNESIUM: Magnesium: 1.5 mg/dL — ABNORMAL LOW (ref 1.7–2.4)

## 2019-01-06 LAB — LIPID PANEL
Cholesterol: 180 mg/dL (ref 0–200)
HDL: 85 mg/dL (ref >40)
LDL Cholesterol: 77 mg/dL (ref 0–99)
Total CHOL/HDL Ratio: 2.1 RATIO
Triglycerides: 88 mg/dL (ref <150)
VLDL: 18 mg/dL (ref 0–40)

## 2019-01-06 LAB — HIV ANTIBODY (ROUTINE TESTING W REFLEX): HIV Screen 4th Generation wRfx: NONREACTIVE

## 2019-01-06 LAB — PHOSPHORUS: Phosphorus: 1.1 mg/dL — ABNORMAL LOW (ref 2.5–4.6)

## 2019-01-06 LAB — LIPASE, BLOOD: Lipase: 149 U/L — ABNORMAL HIGH (ref 11–51)

## 2019-01-06 LAB — SARS CORONAVIRUS 2 (TAT 6-24 HRS): SARS Coronavirus 2: NEGATIVE

## 2019-01-06 MED ORDER — POTASSIUM CHLORIDE CRYS ER 20 MEQ PO TBCR
40.0000 meq | EXTENDED_RELEASE_TABLET | Freq: Once | ORAL | Status: AC
  Administered 2019-01-06: 40 meq via ORAL
  Filled 2019-01-06: qty 2

## 2019-01-06 MED ORDER — MAGNESIUM SULFATE 2 GM/50ML IV SOLN
2.0000 g | Freq: Once | INTRAVENOUS | Status: AC
  Administered 2019-01-06: 2 g via INTRAVENOUS
  Filled 2019-01-06: qty 50

## 2019-01-06 MED ORDER — ALBUTEROL SULFATE (2.5 MG/3ML) 0.083% IN NEBU: 2.5000 mg | INHALATION_SOLUTION | RESPIRATORY_TRACT | Status: DC | PRN

## 2019-01-06 MED ORDER — UMECLIDINIUM BROMIDE 62.5 MCG/INH IN AEPB
1.0000 | INHALATION_SPRAY | Freq: Every day | RESPIRATORY_TRACT | Status: DC
  Administered 2019-01-07 – 2019-01-10 (×4): 1 via RESPIRATORY_TRACT
  Filled 2019-01-06: qty 7

## 2019-01-06 MED ORDER — NICOTINE 14 MG/24HR TD PT24
14.0000 mg | MEDICATED_PATCH | Freq: Every day | TRANSDERMAL | Status: DC
  Administered 2019-01-06 – 2019-01-10 (×5): 14 mg via TRANSDERMAL
  Filled 2019-01-06 (×5): qty 1

## 2019-01-06 MED ORDER — LORAZEPAM 2 MG/ML IJ SOLN
1.0000 mg | Freq: Four times a day (QID) | INTRAMUSCULAR | Status: DC | PRN
  Administered 2019-01-07: 1 mg via INTRAVENOUS
  Filled 2019-01-06: qty 1

## 2019-01-06 MED ORDER — POTASSIUM CHLORIDE CRYS ER 20 MEQ PO TBCR
40.0000 meq | EXTENDED_RELEASE_TABLET | ORAL | Status: AC
  Administered 2019-01-06 (×2): 40 meq via ORAL
  Filled 2019-01-06 (×2): qty 2

## 2019-01-06 MED ORDER — POTASSIUM PHOSPHATES 15 MMOLE/5ML IV SOLN
40.0000 meq | Freq: Once | INTRAVENOUS | Status: AC
  Administered 2019-01-06: 40 meq via INTRAVENOUS
  Filled 2019-01-06: qty 9.09

## 2019-01-06 MED ORDER — SODIUM CHLORIDE 0.9 % IV SOLN
INTRAVENOUS | Status: AC
  Administered 2019-01-06 – 2019-01-07 (×3): via INTRAVENOUS

## 2019-01-06 MED ORDER — LABETALOL HCL 5 MG/ML IV SOLN: 10.0000 mg | INTRAVENOUS | Status: DC | PRN

## 2019-01-06 NOTE — Progress Notes (Signed)
CRITICAL VALUE ALERT  Critical Value:  Potassium 2.6  Date & Time Notied:  01/06/2019 @ 4270  Provider Notified: Dr. Olevia Bowens  Orders Received/Actions taken: awaiting orders

## 2019-01-06 NOTE — Progress Notes (Signed)
Patient Demographics:    Jasmine [name], is a 63 y.o. female, DOB - 05-06-1955, TUU:[id number]  Admit date - 01/05/2019   Admitting Physician Ejiroghene Arlyce Dice, MD  Outpatient Primary MD for the patient is Janora Norlander, DO  LOS - 0   Chief Complaint  Patient presents with   Abdominal Pain        Subjective:    Jasmine [name] today has no fevers, No chest pain,  -Nausea and abdominal pain persist  Assessment  & Plan :    Active Problems:   Acute pancreatitis   Pancreatitis, acute  Brief Summary 63 year old smoker/COPD, HTN, who denies heavy alcohol use admitted on 01/05/2019 with abdominal pain and found to have acute pancreatitis with dilatation of the intra and extrahepatic biliary ducts as well as pancreatic duct -Unable to do MRCP due to implanted neurostimulator -  A/p 1)Acute pancreatitis with dilatation of intra and extrahepatic biliary ducts--- CT abdomen and pelvis and limited abdominal ultrasound report noted -GI consult appreciated -Keep n.p.o., continue aggressive IV fluids -Patient denies significant EtOH use -Triglycerides are only 88\-lipase down to 149 from 181 -WBC down to 14.4 from 16 -CT findings of CBD dilated to 14 mm, intrahepatic biliary ductal dilation, and pancreatic duct dilation measuring up to 8 mm. -Continue IV Protonix -IV Dilaudid as needed -Unable to do MRCP due to implanted neurostimulator -mother with history of cholangiocarcinoma, now deceased -07-16-22 need EUS as outpatient  2)FEN-significant electrolyte derangement with magnesium of 1.5, potassium of 2.6 and phosphorus of 1.1--- replace and recheck, -Continue IV fluids while n.p.o. -EKG with prolonged QT in the setting of antidepressant use- monitor closely -Monitor glucose/fingersticks while n.p.o.  3)HTN--stable, continue amlodipine milligrams daily,   may use IV labetalol when necessary  Every  4 hours for systolic blood pressure over 160 mmhg  4)Tobacco Abuse/COPD-not interested in smoking cessation, okay to give nicotine patch, may use albuterol as needed -Be judicious with bronchodilators due to prolonged QT   5)Chronic pain Syndrome/Depression/Anxiety--- continue Cymbalta 90 mg daily, continue as needed lorazepam -has implanted Neurostimulator   6)Fatty Liver--imaging studies noted, PTA patient was on Lipitor, lipid profile noted---  Disposition/Need for in-Hospital Stay- patient unable to be discharged at this time due to acute pancreatitis requiring IV fluids, IV narcotics  Code Status : Full code  Family Communication:   (patient is alert, awake and coherent) Discussed with Spouse --- Dr. Darliss Cheney- [phone]  Disposition Plan  :  TBD  Consults  :  Gi  DVT Prophylaxis  :  Lovenox -   - SCDs    Lab Results  Component Value Date   PLT 237 01/06/2019    Inpatient Medications  Scheduled Meds:  amLODipine  10 mg Oral Daily   DULoxetine  90 mg Oral Daily   enoxaparin (LOVENOX) injection  40 mg Subcutaneous Q24H   fluticasone furoate-vilanterol  1 puff Inhalation Daily   mouth rinse  15 mL Mouth Rinse BID   pantoprazole (PROTONIX) IV  40 mg Intravenous Q24H   potassium chloride  40 mEq Oral Q3H   Tiotropium Bromide Monohydrate  2.5 Act Each Nare Daily   Continuous Infusions:  sodium chloride 225 mL/hr at 01/06/19 1311   magnesium sulfate bolus IVPB  potassium PHOSPHATE IVPB (mEq)     PRN Meds:.acetaminophen **OR** acetaminophen, HYDROmorphone (DILAUDID) injection, ipratropium-albuterol, labetalol, LORazepam, polyethylene glycol, promethazine    Anti-infectives (From admission, onward)   None        Objective:   Vitals:   01/05/19 2225 01/06/19 0453 01/06/19 0455 01/06/19 0859  BP: (!) 151/97 (!) 184/100 (!) 182/102 (!) 166/95  Pulse: (!) 109  (!) 108   Resp:   16   Temp:      TempSrc:      SpO2:   98%   Weight:        Height:        Wt Readings from Last 3 Encounters:  01/05/19 42.2 kg  12/02/18 45.7 kg  01/09/18 47.9 kg     Intake/Output Summary (Last 24 hours) at 01/06/2019 1435 Last data filed at 01/06/2019 0500 Gross per 24 hour  Intake 1443.75 ml  Output 600 ml  Net 843.75 ml    Physical Exam  Gen:- Awake Alert, resting comfortably HEENT:- [place].AT, No sclera icterus Neck-Supple Neck,No JVD,.  Lungs-  CTAB , fair symmetrical air movement CV- S1, S2 normal, regular  Abd-  +ve B.Sounds, Abd Soft, diffuse abdominal discomfort , no rebound or guarding  extremity/Skin:- No  edema, pedal pulses present  Psych-affect is appropriate, oriented x3 Neuro-no new focal deficits, no tremors   Data Review:   Micro Results No results found for this or any previous visit (from the past 240 hour(s)).  Radiology Reports Ct Abdomen Pelvis W Contrast  Result Date: 01/05/2019 CLINICAL DATA:  Abdominal pain.  Pancreatitis suspected. EXAM: CT ABDOMEN AND PELVIS WITH CONTRAST TECHNIQUE: Multidetector CT imaging of the abdomen and pelvis was performed using the standard protocol following bolus administration of intravenous contrast. CONTRAST:  163mL OMNIPAQUE IOHEXOL 300 MG/ML  SOLN COMPARISON:  04/09/2017 FINDINGS: Lower chest: Lung bases are clear. No effusions. Heart is normal size. Hepatobiliary: Severe diffuse fatty infiltration of the liver. Gallbladder is distended. Common bile duct dilated measuring up to 14 mm. Intrahepatic biliary ductal dilatation. No visible ductal stones. Pancreas: There is pancreatic ductal dilatation measuring up to 8 mm in the pancreatic head. There is inflammation around the pancreas, best seen around the pancreatic head and uncinate process most compatible with acute pancreatitis. Spleen: No focal abnormality.  Normal size. Adrenals/Urinary Tract: Small scattered cysts in the kidneys bilaterally. No hydronephrosis. Urinary bladder unremarkable. Stomach/Bowel: Large stool burden  throughout the colon. No evidence of bowel obstruction. Vascular/Lymphatic: Aortic atherosclerosis. No enlarged abdominal or pelvic lymph nodes. Reproductive: Prior hysterectomy.  No adnexal masses. Other: No free fluid or free air. Musculoskeletal: No acute bony abnormality. IMPRESSION: Stranding noted around the pancreas, most pronounced around the pancreatic head and uncinate process. Findings most compatible with acute pancreatitis. Dilated intrahepatic and extrahepatic biliary ducts as well as the pancreatic duct. Severe diffuse fatty infiltration of the liver. Small bilateral renal cysts. Aortic atherosclerosis. Electronically Signed   By: Rolm Baptise M.D.   On: 01/05/2019 19:51   US Abdomen Limited Ruq  Result Date: 01/06/2019 CLINICAL DATA:  Abdominal pain for 5 days. Patient admitted with pancreatitis. EXAM: ULTRASOUND ABDOMEN LIMITED RIGHT UPPER QUADRANT COMPARISON:  CT abdomen and pelvis 01/05/2019. FINDINGS: Gallbladder: The gallbladder is distended. No gallstones or wall thickening visualized. No sonographic Murphy sign noted by sonographer. Common bile duct: Diameter: 17 mm. Liver: Echogenicity is diffusely increased. No focal lesion. Intrahepatic biliary ductal dilatation is best seen in the left hepatic lobe as on prior CT. Portal vein is  patent on color Doppler imaging with normal direction of blood flow towards the liver. Other: None. IMPRESSION: Negative for gallstones. No finding to explain the patient's intra and extrahepatic biliary ductal dilatation. Fatty infiltration of the liver. Please pick the correct US ABDOMEN LIMITED template. Electronically Signed   By: Inge Rise M.D.   On: 01/06/2019 11:28     CBC Recent Labs  Lab 01/05/19 1454 01/06/19 0552  WBC 16.2* 14.4*  HGB 13.8 12.3  HCT 43.4 39.0  PLT 252 237  MCV 104.8* 104.0*  MCH 33.3 32.8  MCHC 31.8 31.5  RDW 12.8 12.9  LYMPHSABS 0.8  --   MONOABS 1.5*  --   EOSABS 0.1  --   BASOSABS 0.1  --      Chemistries  Recent Labs  Lab 01/05/19 1454 01/06/19 0552 01/06/19 0657  NA 136 136  --   K 2.4* 2.6*  --   CL 90* 95*  --   CO2 26 27  --   GLUCOSE 121* 197*  --   BUN 11 6*  --   CREATININE 0.62 0.39*  --   CALCIUM 9.8 8.5*  --   MG 2.1  --  1.5*  AST 25 22  --   ALT 24 22  --   ALKPHOS 94 83  --   BILITOT 1.6* 1.0  --    ------------------------------------------------------------------------------------------------------------------ Recent Labs    01/06/19 0552  CHOL 180  HDL 85  LDLCALC 77  TRIG 88  CHOLHDL 2.1    No results found for: HGBA1C ------------------------------------------------------------------------------------------------------------------ No results for input(s): TSH, T4TOTAL, T3FREE, THYROIDAB in the last 72 hours.  Invalid input(s): FREET3 ------------------------------------------------------------------------------------------------------------------ No results for input(s): VITAMINB12, FOLATE, FERRITIN, TIBC, IRON, RETICCTPCT in the last 72 hours.  Coagulation profile No results for input(s): INR, PROTIME in the last 168 hours.  No results for input(s): DDIMER in the last 72 hours.  Cardiac Enzymes No results for input(s): CKMB, TROPONINI, MYOGLOBIN in the last 168 hours.  Invalid input(s): CK ------------------------------------------------------------------------------------------------------------------ No results found for: BNP   Roxan Hockey M.D on 01/06/2019 at 2:35 PM  Go to [email or web address] - for contact info  [facility] - Office  [phone]

## 2019-01-06 NOTE — Consult Note (Signed)
Referring Provider: Dr. Jenetta Downer Primary Care Physician:  Janora Norlander, DO Primary Gastroenterologist:  Dr. Gala Romney   Date of Admission: 01/05/2019 Date of Consultation: 01/06/2019  Reason for Consultation:  Acute pancreatitis, dilated intra and extrahepatic biliary and pancreatic ducts.    HPI:  Jasmine [name] is a 63 y.o. year old female presenting to the ED yesterday with abdominal pain, N/V for four days and found to have pancreatitis with lipase 191 on admission, CT with findings of acute pancreatitis and Dilated intrahepatic and extrahepatic biliary ducts as well as pancreatic duct. Severe diffuse fatty liver. CBD measuring up to 14 mm. Gallbladder distended. This morning, slight improvement in lipase to 149.  Tbili mildly elevated at 1.6 yesterday, with normal transaminases and alk phos, now with normal LFTs today. Denies any prior episodes of pancreatitis or similar pain. Lipid panel normal.   She notes acute onset of epigastric/RUQ pain radiating to back on Wednesday. Associated N/V. She decided to present to the ED yesterday due to persistent pain and concern that it may be her gallbladder. Pain is a 9 on scale of 1-10. Pain has not improved since admission. She has short-term relief with IV Dilaudid. Associated nausea. A few episodes of vomiting but not much. No fever or chills. No prior episodes. Decided to come to ED as was not getting better and couldn't go another day. Alcohol is occasional, a few times a month. Denies daily use. Smokes 2 ppd.   Gallbladder present. Has periodic bouts of vomiting, out of the blue, for past 10 years. No prior evaluation for this.  6 lbs lost over the past week. Otherwise, weight has been stable. Chronic GERD on Protonix. She is unsure if has had a colonoscopy. No family history of colorectal cancer or polyps. She does note that her mom was diagnosed with bile duct cancer at age 41 and passed away 4 months later.   Unable to have MRI due  to spinal cord stimulator.   Past Medical History:  Diagnosis Date  . Allergy   . Anemia   . Anxiety   . Arthritis   . Asthma   . Carpal tunnel syndrome    bilateral  . Cigarette smoker 08/25/2017  . Complication of anesthesia    in the past has had N/V, the last few surgeries she has not been  . COPD (chronic obstructive pulmonary disease) ([place])   . Easy bruising 07/10/2016  . GERD (gastroesophageal reflux disease)   . Headache   . Heart murmur 2005   never had any problems  . History of blood transfusion 2018  . History of kidney stones   . Hypertension   . Hypoglycemia   . Hypoglycemia   . Iron deficiency anemia 07/11/2016  . Neuromuscular disorder ([place])    pt denies  . Neuropathy    both arms  . Normocytic anemia 07/29/2015  . Pancreatitis   . Pneumonia   . PONV (postoperative nausea and vomiting)   . Postoperative anemia due to acute blood loss 11/15/2015  . Sciatica   . Seizures ([place])    as a child when she would have an asthma attack. none as an adult  . Shortness of breath dyspnea     Past Surgical History:  Procedure Laterality Date  . ABDOMINAL HYSTERECTOMY  2005  . APPLICATION OF ROBOTIC ASSISTANCE FOR SPINAL PROCEDURE N/A 09/05/2016   Procedure: APPLICATION OF ROBOTIC ASSISTANCE FOR SPINAL PROCEDURE;  Surgeon: Consuella Lose, MD;  Location: [place];  Service:  Neurosurgery;  Laterality: N/A;  . CERVICAL FUSION    . [place]  . COLONOSCOPY    . cyst removal face    . disk repair     neck and lumbar region  . FOOT SURGERY Bilateral    to file bones down   . HARDWARE REMOVAL N/A 09/05/2016   Procedure: HARDWARE REMOVAL AND REPLACEMENT OF LUMBAR FIVE SCREWS;  Surgeon: Consuella Lose, MD;  Location: [place];  Service: Neurosurgery;  Laterality: N/A;  . IMPLANTATION / PLACEMENT EPIDURAL NEUROSTIMULATOR ELECTRODES    . LAMINECTOMY     2006  . LUMBAR FUSION    . SPINAL CORD STIMULATOR BATTERY EXCHANGE    . SPINAL CORD STIMULATOR INSERTION N/A  07/05/2016   Procedure: REPLACEMENT OF LUMBAR SPINAL CORD STIMULATOR BATTERY;  Surgeon: Newman Pies, MD;  Location: [place];  Service: Neurosurgery;  Laterality: N/A;  . TONSILLECTOMY      Prior to Admission medications   Medication Sig Start Date End Date Taking? Authorizing Provider  albuterol (PROAIR HFA) 108 (90 Base) MCG/ACT inhaler USE 2 INHALATIONS EVERY 6 HOURS AS NEEDED FOR WHEEZING OR SHORTNESS OF BREATH (Needs to be seen before next refill) 09/10/18  Yes Gottschalk, Ashly M, DO  amLODipine (NORVASC) 10 MG tablet Take 1 tablet (10 mg total) by mouth daily. 12/10/17  Yes Gottschalk, Leatrice Jewels M, DO  atorvastatin (LIPITOR) 40 MG tablet Take 1 tablet (40 mg total) by mouth daily. 08/01/18  Yes Ronnie Doss M, DO  calcium carbonate (OSCAL) 1500 (600 Ca) MG TABS tablet Take 600 mg by mouth daily.    Yes [provider]  Cholecalciferol (D-3-5) 5000 units capsule Take 5,000 Units daily by mouth.   Yes [provider]  diclofenac sodium (VOLTAREN) 1 % GEL Apply 1 application topically daily as needed for muscle pain. 06/30/16  Yes [provider]  DULoxetine (CYMBALTA) 30 MG capsule TAKE 3 CAPSULES DAILY (MUST BE SEEN BEFORE NEXT REFILL) 02/11/18  Yes Gottschalk, Leatrice Jewels M, DO  feeding supplement, ENSURE ENLIVE, (ENSURE ENLIVE) LIQD Take 237 mLs by mouth 2 (two) times daily between meals. 07/04/17  Yes Nita Sells, MD  HYDROcodone Bitartrate ER (HYSINGLA ER) 60 MG T24A Take 1 tablet by mouth daily.   Yes [provider]  HYDROcodone-acetaminophen (NORCO) 7.5-325 MG tablet Take 1 tablet by mouth 4 (four) times daily.   Yes [provider]  Iron Polysacch Cmplx-B12-FA 150-0.025-1 MG CAPS Take 1 capsule by mouth daily. 03/05/18  Yes Derek Jack, MD  L-Methylfolate-Algae-B12-B6 [facility]) 3-90.314-2-35 MG CAPS Take 1 tablet by mouth 2 (two) times daily. 10/31/17  Yes Gottschalk, Leatrice Jewels M, DO  Omega-3 Fatty Acids (FISH OIL) 1000 MG CAPS Take 1  capsule by mouth daily.    Yes [provider]  OXYGEN 2lpm with sleep and as needed during the day   Yes [provider]  pantoprazole (PROTONIX) 40 MG tablet Take 1 tablet (40 mg total) by mouth daily. 12/10/17  Yes Gottschalk, Leatrice Jewels M, DO  SPIRIVA RESPIMAT 2.5 MCG/ACT AERS USE 2 INHALATIONS DAILY 09/09/18  Yes Tanda Rockers, MD  tizanidine (ZANAFLEX) 6 MG capsule Take 1 capsule 3 (three) times daily by mouth. 11/27/16  Yes [provider]  Aspirin-Salicylamide-Caffeine (BC HEADACHE POWDER PO) Take 1 packet by mouth daily as needed (headaches).     [provider]  budesonide-formoterol (SYMBICORT) 160-4.5 MCG/ACT inhaler USE 2 INHALATIONS TWICE A DAY 01/29/17   Timmothy Euler, MD  cetirizine (ZYRTEC) 10 MG tablet Take 10  mg by mouth daily as needed for allergies. Reported on 03/12/2015    [provider]  guaiFENesin (MUCINEX) 600 MG 12 hr tablet Take 600 mg by mouth 2 (two) times daily.     [provider]  ipratropium-albuterol (DUONEB) 0.5-2.5 (3) MG/3ML SOLN Inhale 3 mLs into the lungs 4 (four) times daily as needed (for shortness of breath/wheezing). *May use as needed 02/21/16   [provider]  magic mouthwash w/lidocaine SOLN Take 5 mLs by mouth 3 (three) times daily. 07/12/17   Terald Sleeper, PA-C  nicotine (NICODERM CQ - DOSED IN MG/24 HOURS) 21 mg/24hr patch Place 1 patch (21 mg total) onto the skin daily. 07/09/17   Janora Norlander, DO  traZODone (DESYREL) 150 MG tablet Take 1.5-2 tablets (225-300 mg total) by mouth at bedtime. Patient not taking: Reported on 01/05/2019 02/25/18   Janora Norlander, DO    Current Facility-Administered Medications  Medication Dose Route Frequency Provider Last Rate Last Dose  . acetaminophen (TYLENOL) tablet 650 mg  650 mg Oral Q6H PRN Emokpae, Ejiroghene E, MD       Or  . acetaminophen (TYLENOL) suppository 650 mg  650 mg Rectal Q6H PRN Emokpae, Ejiroghene E, MD      . amLODipine  (NORVASC) tablet 10 mg  10 mg Oral Daily Emokpae, Ejiroghene E, MD      . dextrose 5 % and 0.9 % NaCl with KCl 40 mEq/L infusion   Intravenous Continuous Emokpae, Ejiroghene E, MD 125 mL/hr at 01/05/19 2327    . DULoxetine (CYMBALTA) DR capsule 90 mg  90 mg Oral Daily Emokpae, Ejiroghene E, MD      . enoxaparin (LOVENOX) injection 40 mg  40 mg Subcutaneous Q24H Emokpae, Ejiroghene E, MD      . fluticasone furoate-vilanterol (BREO ELLIPTA) 200-25 MCG/INH 1 puff  1 puff Inhalation Daily Emokpae, Ejiroghene E, MD      . HYDROmorphone (DILAUDID) injection 1 mg  1 mg Intravenous Q3H PRN Emokpae, Ejiroghene E, MD   1 mg at 01/06/19 0619  . ipratropium-albuterol (DUONEB) 0.5-2.5 (3) MG/3ML nebulizer solution 3 mL  3 mL Inhalation QID PRN Emokpae, Ejiroghene E, MD      . labetalol (NORMODYNE) injection 5 mg  5 mg Intravenous Q2H PRN Emokpae, Ejiroghene E, MD   5 mg at 01/06/19 0502  . MEDLINE mouth rinse  15 mL Mouth Rinse BID Emokpae, Ejiroghene E, MD      . pantoprazole (PROTONIX) injection 40 mg  40 mg Intravenous Q24H Emokpae, Ejiroghene E, MD      . polyethylene glycol (MIRALAX / GLYCOLAX) packet 17 g  17 g Oral Daily PRN Emokpae, Ejiroghene E, MD      . potassium chloride SA (KLOR-CON) CR tablet 40 mEq  40 mEq Oral Once Reubin Milan, MD      . promethazine (PHENERGAN) injection 12.5 mg  12.5 mg Intravenous Q6H PRN Emokpae, Ejiroghene E, MD      . Tiotropium Bromide Monohydrate AERS 2.5 Act  2.5 Act Each Nare Daily Emokpae, Ejiroghene E, MD        Allergies as of 01/05/2019 - Review Complete 01/05/2019  Allergen Reaction Noted  . Lyrica [pregabalin] Other (See Comments) 06/29/2016  . Darvon [propoxyphene]  02/22/2015  . Gabapentin  08/31/2016  . Augmentin [amoxicillin-pot clavulanate] Nausea And Vomiting 03/19/2015  . Erythromycin Itching and Rash 02/22/2015  . Vibramycin [doxycycline calcium] Itching and Rash 02/22/2015    Family History  Problem Relation Age of Onset  .  Heart  disease Mother   . Diabetes Mother   . Hypertension Mother   . Hyperlipidemia Mother   . COPD Mother        smoked  . COPD Father   . Diabetes Father   . Heart disease Father   . Cancer Father        throat  . Dementia Father   . Asthma Son   . Hypertension Sister   . Hyperlipidemia Sister   . Hypertension Brother   . Hyperlipidemia Brother   . Hypertension Brother   . Hyperlipidemia Brother   . Emphysema Maternal Grandmother        smoked    Social History   Socioeconomic History  . Marital status: Married    Spouse name: Not on file  . Number of children: Not on file  . Years of education: Not on file  . Highest education level: Not on file  Occupational History  . Not on file  Social Needs  . Financial resource strain: Not on file  . Food insecurity    Worry: Not on file    Inability: Not on file  . Transportation needs    Medical: Not on file    Non-medical: Not on file  Tobacco Use  . Smoking status: Current Every Day Smoker    Packs/day: 2.00    Years: 35.00    Pack years: 70.00    Types: Cigarettes  . Smokeless tobacco: Never Used  . Tobacco comment: trying to quit  Substance and Sexual Activity  . Alcohol use: Yes    Alcohol/week: 2.0 standard drinks    Types: 2 Glasses of wine per week    Comment: occasionally  . Drug use: No  . Sexual activity: Not on file  Lifestyle  . Physical activity    Days per week: Not on file    Minutes per session: Not on file  . Stress: Not on file  Relationships  . Social Herbalist on phone: Not on file    Gets together: Not on file    Attends religious service: Not on file    Active member of club or organization: Not on file    Attends meetings of clubs or organizations: Not on file    Relationship status: Not on file  . Intimate partner violence    Fear of current or ex partner: Not on file    Emotionally abused: Not on file    Physically abused: Not on file    Forced sexual activity: Not on  file  Other Topics Concern  . Not on file  Social History Narrative  . Not on file    Review of Systems: Gen: see HPI CV: Denies chest pain, heart palpitations, syncope, edema  Resp: Denies shortness of breath with rest, cough, wheezing GI: see HPI  GU : Denies urinary burning, urinary frequency, urinary incontinence.  MS: Denies joint pain,swelling, cramping Derm: Denies rash, itching, dry skin Psych: Denies depression, anxiety,confusion, or memory loss Heme: Denies bruising, bleeding, and enlarged lymph nodes.  Physical Exam: Vital signs in last 24 hours: Temp:  [98.7 F (37.1 C)] 98.7 F (37.1 C) (11/01 1433) Pulse Rate:  [73-123] 108 (11/02 0455) Resp:  [11-27] 16 (11/02 0455) BP: (136-193)/(88-111) 182/102 (11/02 0455) SpO2:  [94 %-100 %] 98 % (11/02 0455) Weight:  [42.2 kg-45.4 kg] 42.2 kg (11/01 2132) Last BM Date: 01/01/19 General:   Alert, uncomfortable, grimacing, cooperative.   Eyes:  Sclera clear, no  icterus.   Conjunctiva pink. Ears:  Normal auditory acuity. Lungs:  Clear throughout to auscultation.    Heart:  S1 S2 present, HR 114 Abdomen:  +BS, Soft, significantly TTP RUQ, TTP epigastric, no rebound or guarding. Non-distended.  Rectal:  Deferred  Msk:  Symmetrical without gross deformities. Normal posture. Extremities:  Without edema. Thin.  Neurologic:  Alert and  oriented x4 Psych:  Alert and cooperative. Normal mood and affect.  Intake/Output from previous day: 11/01 0701 - 11/02 0700 In: 1443.8 [I.V.:443.8; IV Piggyback:1000] Out: 600  Intake/Output this shift: No intake/output data recorded.  Lab Results: Recent Labs    01/05/19 1454 01/06/19 0552  WBC 16.2* 14.4*  HGB 13.8 12.3  HCT 43.4 39.0  PLT 252 237   BMET Recent Labs    01/05/19 1454 01/06/19 0552  NA 136 136  K 2.4* 2.6*  CL 90* 95*  CO2 26 27  GLUCOSE 121* 197*  BUN 11 6*  CREATININE 0.62 0.39*  CALCIUM 9.8 8.5*   LFT Recent Labs    01/05/19 1454 01/06/19 0552   PROT 7.3 6.3*  ALBUMIN 3.6 3.1*  AST 25 22  ALT 24 22  ALKPHOS 94 83  BILITOT 1.6* 1.0   Studies/Results: Ct Abdomen Pelvis W Contrast  Result Date: 01/05/2019 CLINICAL DATA:  Abdominal pain.  Pancreatitis suspected. EXAM: CT ABDOMEN AND PELVIS WITH CONTRAST TECHNIQUE: Multidetector CT imaging of the abdomen and pelvis was performed using the standard protocol following bolus administration of intravenous contrast. CONTRAST:  147m OMNIPAQUE IOHEXOL 300 MG/ML  SOLN COMPARISON:  04/09/2017 FINDINGS: Lower chest: Lung bases are clear. No effusions. Heart is normal size. Hepatobiliary: Severe diffuse fatty infiltration of the liver. Gallbladder is distended. Common bile duct dilated measuring up to 14 mm. Intrahepatic biliary ductal dilatation. No visible ductal stones. Pancreas: There is pancreatic ductal dilatation measuring up to 8 mm in the pancreatic head. There is inflammation around the pancreas, best seen around the pancreatic head and uncinate process most compatible with acute pancreatitis. Spleen: No focal abnormality.  Normal size. Adrenals/Urinary Tract: Small scattered cysts in the kidneys bilaterally. No hydronephrosis. Urinary bladder unremarkable. Stomach/Bowel: Large stool burden throughout the colon. No evidence of bowel obstruction. Vascular/Lymphatic: Aortic atherosclerosis. No enlarged abdominal or pelvic lymph nodes. Reproductive: Prior hysterectomy.  No adnexal masses. Other: No free fluid or free air. Musculoskeletal: No acute bony abnormality. IMPRESSION: Stranding noted around the pancreas, most pronounced around the pancreatic head and uncinate process. Findings most compatible with acute pancreatitis. Dilated intrahepatic and extrahepatic biliary ducts as well as the pancreatic duct. Severe diffuse fatty infiltration of the liver. Small bilateral renal cysts. Aortic atherosclerosis. Electronically Signed   By: KRolm BaptiseM.D.   On: 01/05/2019 19:51     Impression: 63year old female admitted with acute uncomplicated first episode of pancreatitis, with normal HFP (only slightly elevated bilirubin on admission and now resolved), and CT findings of CBD dilated to 14 mm, intrahepatic biliary ductal dilation, and pancreatic duct dilation measuring up to 8 mm. Without improvement in abdominal pain this admission except for brief periods after IV Dilaudid, tachycardic around 114 at time of consultation, and visibly uncomfortable. She is unable to complete MRI/MRCP due to implanted neurostimulator. Gallbladder present.   During this window of time, need to maximize IV fluid resuscitation. Discussed with Dr. EDenton Brickand will change the IV fluids already present (NS with 40 mEq K ) [facility] normal saline as primary IV fluids for aggressive hydration at 539mkg/hour.  As she is unable to have an MRI, will order RUQ [place]. With findings on CT, will need follow-up with EUS as outpatient. As of note, mother with history of cholangiocarcinoma, now deceased.   Plan: Start NS IV at 225 ml/hour (weight-based) Remain NPO RUQ US abdomen Supportive measures with pain control: on chronic outpatient opioids and will likely require increase in Dilaudid dosing to manage pain Will continue to follow with you   Annitta Needs, PhD, ANP-BC [facility]     LOS: 0 days    01/06/2019, 7:56 AM

## 2019-01-06 NOTE — Progress Notes (Signed)
Initial Nutrition Assessment  DOCUMENTATION CODES:      INTERVENTION:  Provide nutrition education as appropriate   Initiate oral nutrition supplement as indicated once patient is cleared for diet advancement    NUTRITION DIAGNOSIS:   Increased nutrient needs related to acute illness(pancreatitis) as evidenced by percent weight loss(poor intake reported for at least the past 5 days, wt loss 7.7 lb (>5%) in a month).   GOAL: Pt to meet >/= 90% of their estimated nutrition needs      MONITOR:   Diet advancement, Labs, Weight trends  REASON FOR ASSESSMENT:   Malnutrition Screening Tool    ASSESSMENT: Patient is an underweight 63 yo who presents with acute pancreatitis. NPO currently. History of  COPD, PNA, HTN, GERD, Anemia.   Patient reports still experiencing significant pain and doesn't feel like talking. RD will follow up with her tomorrow for nutrition history details . Likely acute malnutrition.   Weight history: acute loss of 3.5 kg (7.7 lb)- >5% in a month.  Medications reviewed and include: Protonix, Lovenox, Cymbalta  BMP Latest Ref Rng & Units 01/06/2019 01/05/2019 10/29/2017  Glucose 70 - 99 mg/dL 197(H) 121(H) 106(H)  BUN 8 - 23 mg/dL 6(L) 11 11  Creatinine 0.44 - 1.00 mg/dL 0.39(L) 0.62 0.51(L)  BUN/Creat Ratio 12 - 28 - - 22  Sodium 135 - 145 mmol/L 136 136 142  Potassium 3.5 - 5.1 mmol/L 2.6(LL) 2.4(LL) 3.5  Chloride 98 - 111 mmol/L 95(L) 90(L) 103  CO2 22 - 32 mmol/L 27 26 22   Calcium 8.9 - 10.3 mg/dL 8.5(L) 9.8 9.6    NUTRITION - FOCUSED PHYSICAL EXAM: Deferred to next visit    Diet Order:   Diet Order            Diet NPO time specified Except for: Sips with Meds  Diet effective now              EDUCATION NEEDS: not appropriate for education today    Skin:  Skin Assessment: Reviewed RN Assessment(dry, flaky and red buttocks)  Last BM:  10/28  Height:   Ht Readings from Last 1 Encounters:  01/05/19 5\' 3"  (1.6 m)    Weight:   Wt  Readings from Last 1 Encounters:  01/05/19 42.2 kg    Ideal Body Weight:  52 kg  BMI:  Body mass index is 16.48 kg/m.  Estimated Nutritional Needs:   Kcal:  1260-1470 (30-35 kcal/kg/bw)  Protein:  55-63 (1.3-1.5 gr/kg/bw)  Fluid:  >1200 ml daily   Colman Cater MS,RD,CSG,LDN Office: [phone] Pager: [phone]

## 2019-01-07 DIAGNOSIS — K859 Acute pancreatitis without necrosis or infection, unspecified: Principal | ICD-10-CM

## 2019-01-07 LAB — COMPREHENSIVE METABOLIC PANEL
ALT: 23 U/L (ref 0–44)
AST: 32 U/L (ref 15–41)
Albumin: 2.8 g/dL — ABNORMAL LOW (ref 3.5–5.0)
Alkaline Phosphatase: 128 U/L — ABNORMAL HIGH (ref 38–126)
Anion gap: 10 (ref 5–15)
BUN: 6 mg/dL — ABNORMAL LOW (ref 8–23)
CO2: 25 mmol/L (ref 22–32)
Calcium: 8.3 mg/dL — ABNORMAL LOW (ref 8.9–10.3)
Chloride: 104 mmol/L (ref 98–111)
Creatinine, Ser: 0.37 mg/dL — ABNORMAL LOW (ref 0.44–1.00)
GFR calc Af Amer: >60 mL/min (ref >60)
GFR calc non Af Amer: >60 mL/min (ref >60)
Glucose, Bld: 144 mg/dL — ABNORMAL HIGH (ref 70–99)
Potassium: 3.4 mmol/L — ABNORMAL LOW (ref 3.5–5.1)
Sodium: 139 mmol/L (ref 135–145)
Total Bilirubin: 0.6 mg/dL (ref 0.3–1.2)
Total Protein: 6 g/dL — ABNORMAL LOW (ref 6.5–8.1)

## 2019-01-07 LAB — GLUCOSE, CAPILLARY
Glucose-Capillary: 105 mg/dL — ABNORMAL HIGH (ref 70–99)
Glucose-Capillary: 113 mg/dL — ABNORMAL HIGH (ref 70–99)
Glucose-Capillary: 132 mg/dL — ABNORMAL HIGH (ref 70–99)
Glucose-Capillary: 160 mg/dL — ABNORMAL HIGH (ref 70–99)

## 2019-01-07 LAB — CBC
HCT: 39.5 % (ref 36.0–46.0)
Hemoglobin: 12.1 g/dL (ref 12.0–15.0)
MCH: 32.6 pg (ref 26.0–34.0)
MCHC: 30.6 g/dL (ref 30.0–36.0)
MCV: 106.5 fL — ABNORMAL HIGH (ref 80.0–100.0)
Platelets: 210 10*3/uL (ref 150–400)
RBC: 3.71 MIL/uL — ABNORMAL LOW (ref 3.87–5.11)
RDW: 13.1 % (ref 11.5–15.5)
WBC: 14.1 10*3/uL — ABNORMAL HIGH (ref 4.0–10.5)
nRBC: 0 % (ref 0.0–0.2)

## 2019-01-07 MED ORDER — OXYCODONE HCL 5 MG PO TABS
10.0000 mg | ORAL_TABLET | ORAL | Status: DC | PRN
  Administered 2019-01-07 – 2019-01-10 (×8): 10 mg via ORAL
  Filled 2019-01-07 (×9): qty 2

## 2019-01-07 MED ORDER — SODIUM CHLORIDE 0.9 % IV SOLN
INTRAVENOUS | Status: DC
  Administered 2019-01-07 – 2019-01-10 (×9): via INTRAVENOUS

## 2019-01-07 MED ORDER — POTASSIUM CHLORIDE CRYS ER 20 MEQ PO TBCR
40.0000 meq | EXTENDED_RELEASE_TABLET | ORAL | Status: AC
  Administered 2019-01-07 (×2): 40 meq via ORAL
  Filled 2019-01-07 (×2): qty 2

## 2019-01-07 NOTE — Progress Notes (Signed)
Patient Demographics:    Jasmine [name], is a 63 y.o. female, DOB - November 05, 1955, ZTI:[id number]  Admit date - 01/05/2019   Admitting Physician Ejiroghene Arlyce Dice, MD  Outpatient Primary MD for the patient is Janora Norlander, DO  LOS - 1   Chief Complaint  Patient presents with   Abdominal Pain        Subjective:    Jasmine [name] today has no fevers, No chest pain,  -Nausea and abdominal pain persist -PTA patient was on very high doses of hydrocodone from pain specialist  Assessment  & Plan :    Active Problems:   Acute pancreatitis   Pancreatitis, acute  Brief Summary 63 year old smoker/COPD, HTN, who denies heavy alcohol use admitted on 01/05/2019 with abdominal pain and found to have acute pancreatitis with dilatation of the intra and extrahepatic biliary ducts as well as pancreatic duct -Unable to do MRCP due to implanted neurostimulator -  A/p 1)Acute pancreatitis with dilatation of intra and extrahepatic biliary ducts, as well as Pancreatic duct Dilatation--- CT abdomen and pelvis and limited abdominal ultrasound report noted -GI consult appreciated -Keep n.p.o., continue  IV fluids -Patient denies significant EtOH use -Triglycerides are only 88\-lipase down to 149 from 181 -WBC down to 14.4 from 16 -CT findings of CBD dilated to 14 mm, intrahepatic biliary ductal dilation, and pancreatic duct dilation measuring up to 8 mm. -Continue IV Protonix -IV Dilaudid as needed -PTA patient was on high doses of opiates -Requiring high doses of IV and oral opiates for pain control -Unable to do MRCP due to implanted neurostimulator -mother with history of cholangiocarcinoma, now deceased -07/15/22 need EUS as outpatient in about 6 weeks  2)FEN-significant electrolyte derangement with magnesium of 1.5, potassium of 2.6 and phosphorus of 1.1--- replace and recheck, -Continue IV fluids while  n.p.o. -EKG with prolonged QT in the setting of antidepressant use- monitor closely -Monitor glucose/fingersticks while n.p.o.  3)HTN--stable, continue amlodipine milligrams daily,   may use IV labetalol when necessary  Every 4 hours for systolic blood pressure over 160 mmhg  4)Tobacco Abuse/COPD-not interested in smoking cessation, okay to give nicotine patch, may use albuterol as needed -Be judicious with bronchodilators due to prolonged QT   5)Chronic pain Syndrome/Depression/Anxiety--- continue Cymbalta 90 mg daily, continue as needed lorazepam -has implanted Neurostimulator   6)Fatty Liver--imaging studies noted, PTA patient was on Lipitor, lipid profile noted---  Disposition/Need for in-Hospital Stay- patient unable to be discharged at this time due to acute pancreatitis requiring IV fluids, IV narcotics  Code Status : Full code  Family Communication:   (patient is alert, awake and coherent) Discussed with Spouse --- Dr. Darliss Cheney- [phone]  Disposition Plan  :  TBD  Consults  :  Gi  DVT Prophylaxis  :  Lovenox -   - SCDs    Lab Results  Component Value Date   PLT 210 01/07/2019    Inpatient Medications  Scheduled Meds:  amLODipine  10 mg Oral Daily   DULoxetine  90 mg Oral Daily   enoxaparin (LOVENOX) injection  40 mg Subcutaneous Q24H   fluticasone furoate-vilanterol  1 puff Inhalation Daily   mouth rinse  15 mL Mouth Rinse BID   nicotine  14 mg Transdermal Daily  pantoprazole (PROTONIX) IV  40 mg Intravenous Q24H   potassium chloride  40 mEq Oral Q3H   umeclidinium bromide  1 puff Inhalation Daily   Continuous Infusions:  sodium chloride 225 mL/hr at 01/07/19 0602   PRN Meds:.acetaminophen **OR** acetaminophen, albuterol, HYDROmorphone (DILAUDID) injection, labetalol, LORazepam, polyethylene glycol, promethazine    Anti-infectives (From admission, onward)   None        Objective:   Vitals:   01/07/19 0400 01/07/19 0530 01/07/19  0600 01/07/19 0652  BP: (!) 160/98 126/83    Pulse: (!) 123 (!) 116 (!) 109 (!) 102  Resp: 20 20    Temp: 99.5 F (37.5 C) 99.9 F (37.7 C)    TempSrc: Oral Oral    SpO2: 99% 98%    Weight:      Height:        Wt Readings from Last 3 Encounters:  01/05/19 42.2 kg  12/02/18 45.7 kg  01/09/18 47.9 kg     Intake/Output Summary (Last 24 hours) at 01/07/2019 0816 Last data filed at 01/07/2019 0300 Gross per 24 hour  Intake 1555.05 ml  Output --  Net 1555.05 ml    Physical Exam  Gen:- Awake Alert, resting comfortably HEENT:- [place].AT, No sclera icterus Neck-Supple Neck,No JVD,.  Lungs-  CTAB , fair symmetrical air movement CV- S1, S2 normal, regular  Abd-  +ve B.Sounds, Abd Soft, diffuse abdominal discomfort , no rebound or guarding  extremity/Skin:- No  edema, pedal pulses present  Psych-affect is appropriate, oriented x3 Neuro-no new focal deficits, no tremors   Data Review:   Micro Results Recent Results (from the past 240 hour(s))  SARS CORONAVIRUS 2 (TAT 6-24 HRS) Nasopharyngeal Nasopharyngeal Swab     Status: None   Collection Time: 01/05/19  7:43 PM   Specimen: Nasopharyngeal Swab  Result Value Ref Range Status   SARS Coronavirus 2 NEGATIVE NEGATIVE Final    Comment: (NOTE) SARS-CoV-2 target nucleic acids are NOT DETECTED. The SARS-CoV-2 RNA is generally detectable in upper and lower respiratory specimens during the acute phase of infection. Negative results do not preclude SARS-CoV-2 infection, do not rule out co-infections with other pathogens, and should not be used as the sole basis for treatment or other patient management decisions. Negative results must be combined with clinical observations, patient history, and epidemiological information. The expected result is Negative. Fact Sheet for Patients: SugarRoll.be Fact Sheet for Healthcare Providers: https://www.woods-mathews.com/ This test is not yet approved or  cleared by the [place] FDA and  has been authorized for detection and/or diagnosis of SARS-CoV-2 by FDA under an Emergency Use Authorization (EUA). This EUA will remain  in effect (meaning this test can be used) for the duration of the COVID-19 declaration under Section 56 4(b)(1) of the Act, 21 U.S.C. section 360bbb-3(b)(1), unless the authorization is terminated or revoked sooner. Performed at [facility], [place] [address] [place], [place] [address]     Radiology Reports Ct Abdomen Pelvis W Contrast  Result Date: 01/05/2019 CLINICAL DATA:  Abdominal pain.  Pancreatitis suspected. EXAM: CT ABDOMEN AND PELVIS WITH CONTRAST TECHNIQUE: Multidetector CT imaging of the abdomen and pelvis was performed using the standard protocol following bolus administration of intravenous contrast. CONTRAST:  121mL OMNIPAQUE IOHEXOL 300 MG/ML  SOLN COMPARISON:  04/09/2017 FINDINGS: Lower chest: Lung bases are clear. No effusions. Heart is normal size. Hepatobiliary: Severe diffuse fatty infiltration of the liver. Gallbladder is distended. Common bile duct dilated measuring up to 14 mm. Intrahepatic biliary ductal dilatation. No  visible ductal stones. Pancreas: There is pancreatic ductal dilatation measuring up to 8 mm in the pancreatic head. There is inflammation around the pancreas, best seen around the pancreatic head and uncinate process most compatible with acute pancreatitis. Spleen: No focal abnormality.  Normal size. Adrenals/Urinary Tract: Small scattered cysts in the kidneys bilaterally. No hydronephrosis. Urinary bladder unremarkable. Stomach/Bowel: Large stool burden throughout the colon. No evidence of bowel obstruction. Vascular/Lymphatic: Aortic atherosclerosis. No enlarged abdominal or pelvic lymph nodes. Reproductive: Prior hysterectomy.  No adnexal masses. Other: No free fluid or free air. Musculoskeletal: No acute bony abnormality. IMPRESSION: Stranding noted around the pancreas, most  pronounced around the pancreatic head and uncinate process. Findings most compatible with acute pancreatitis. Dilated intrahepatic and extrahepatic biliary ducts as well as the pancreatic duct. Severe diffuse fatty infiltration of the liver. Small bilateral renal cysts. Aortic atherosclerosis. Electronically Signed   By: Rolm Baptise M.D.   On: 01/05/2019 19:51   US Abdomen Limited Ruq  Result Date: 01/06/2019 CLINICAL DATA:  Abdominal pain for 5 days. Patient admitted with pancreatitis. EXAM: ULTRASOUND ABDOMEN LIMITED RIGHT UPPER QUADRANT COMPARISON:  CT abdomen and pelvis 01/05/2019. FINDINGS: Gallbladder: The gallbladder is distended. No gallstones or wall thickening visualized. No sonographic Murphy sign noted by sonographer. Common bile duct: Diameter: 17 mm. Liver: Echogenicity is diffusely increased. No focal lesion. Intrahepatic biliary ductal dilatation is best seen in the left hepatic lobe as on prior CT. Portal vein is patent on color Doppler imaging with normal direction of blood flow towards the liver. Other: None. IMPRESSION: Negative for gallstones. No finding to explain the patient's intra and extrahepatic biliary ductal dilatation. Fatty infiltration of the liver. Please pick the correct US ABDOMEN LIMITED template. Electronically Signed   By: Inge Rise M.D.   On: 01/06/2019 11:28     CBC Recent Labs  Lab 01/05/19 1454 01/06/19 0552 01/07/19 0521  WBC 16.2* 14.4* 14.1*  HGB 13.8 12.3 12.1  HCT 43.4 39.0 39.5  PLT 252 237 210  MCV 104.8* 104.0* 106.5*  MCH 33.3 32.8 32.6  MCHC 31.8 31.5 30.6  RDW 12.8 12.9 13.1  LYMPHSABS 0.8  --   --   MONOABS 1.5*  --   --   EOSABS 0.1  --   --   BASOSABS 0.1  --   --     Chemistries  Recent Labs  Lab 01/05/19 1454 01/06/19 0552 01/06/19 0657 01/07/19 0521  NA 136 136  --  139  K 2.4* 2.6*  --  3.4*  CL 90* 95*  --  104  CO2 26 27  --  25  GLUCOSE 121* 197*  --  144*  BUN 11 6*  --  6*  CREATININE 0.62 0.39*  --   0.37*  CALCIUM 9.8 8.5*  --  8.3*  MG 2.1  --  1.5*  --   AST 25 22  --  32  ALT 24 22  --  23  ALKPHOS 94 83  --  128*  BILITOT 1.6* 1.0  --  0.6   ------------------------------------------------------------------------------------------------------------------ Recent Labs    01/06/19 0552  CHOL 180  HDL 85  LDLCALC 77  TRIG 88  CHOLHDL 2.1    No results found for: HGBA1C ------------------------------------------------------------------------------------------------------------------ No results for input(s): TSH, T4TOTAL, T3FREE, THYROIDAB in the last 72 hours.  Invalid input(s): FREET3 ------------------------------------------------------------------------------------------------------------------ No results for input(s): VITAMINB12, FOLATE, FERRITIN, TIBC, IRON, RETICCTPCT in the last 72 hours.  Coagulation profile No results for input(s): INR, PROTIME in  the last 168 hours.  No results for input(s): DDIMER in the last 72 hours.  Cardiac Enzymes No results for input(s): CKMB, TROPONINI, MYOGLOBIN in the last 168 hours.  Invalid input(s): CK ------------------------------------------------------------------------------------------------------------------ No results found for: BNP   Roxan Hockey M.D on 01/07/2019 at 8:16 AM  Go to [email or web address] - for contact info  [facility] - Office  [phone]

## 2019-01-07 NOTE — Progress Notes (Addendum)
Subjective: Some improvement in upper abdominal pain. Still requiring pain medication every 4 hours. Feels she is able to sleep better. No nausea at this time. She was nauseous with 1 episode of vomiting this morning. 3 episodes of vomiting yesterday. No hematemesis. Nausea comes and goes. About the same as yesterday. BM this morning. Soft and formed. No blood in the stool. No melena. No GERD symptoms. HR still elevated today. Patient states HR has been elevated for a few months now. Per patient, her last meal was Wednesday 01/01/19.   Objective: Vital signs in last 24 hours: Temp:  [98.5 F (36.9 C)-99.9 F (37.7 C)] 99.9 F (37.7 C) (11/03 0530) Pulse Rate:  [102-123] 102 (11/03 0652) Resp:  [19-20] 20 (11/03 0530) BP: (126-166)/(83-98) 126/83 (11/03 0530) SpO2:  [98 %-100 %] 99 % (11/03 0818) Last BM Date: 01/07/19 General:   Alert and oriented, resting comfortably, ill appearing.  Head:  Normocephalic and atraumatic. Eyes:  No icterus, sclera clear. Conjuctiva pink.  Heart:  S1, S2 present, tachycardia present, no murmurs noted.  Lungs: Clear to auscultation bilaterally, without wheezing, rales, or rhonchi.  Abdomen:  Bowel sounds present, soft, non-distended. Significant tenderness to palpation in the epigastric area. Mild-moderate tenderness in the RUQ. No rebound or guarding.  Msk:  Symmetrical without gross deformities. Normal posture. Extremities:  Without edema. Neurologic:  Alert and  oriented x4;  grossly normal neurologically. Skin:  Warm and dry, intact without significant lesions.  Psych:  Normal mood and affect.  Intake/Output from previous day: 11/02 0701 - 11/03 0700 In: 1555.1 [I.V.:1005.1; IV Piggyback:550] Out: -  Intake/Output this shift: No intake/output data recorded.   Lab Results: Recent Labs    01/05/19 1454 01/06/19 0552 01/07/19 0521  WBC 16.2* 14.4* 14.1*  HGB 13.8 12.3 12.1  HCT 43.4 39.0 39.5  PLT 252 237 210   BMET Recent Labs     01/05/19 1454 01/06/19 0552 01/07/19 0521  NA 136 136 139  K 2.4* 2.6* 3.4*  CL 90* 95* 104  CO2 '26 27 25  ' GLUCOSE 121* 197* 144*  BUN 11 6* 6*  CREATININE 0.62 0.39* 0.37*  CALCIUM 9.8 8.5* 8.3*   LFT Recent Labs    01/05/19 1454 01/06/19 0552 01/07/19 0521  PROT 7.3 6.3* 6.0*  ALBUMIN 3.6 3.1* 2.8*  AST 25 22 32  ALT '24 22 23  ' ALKPHOS 94 83 128*  BILITOT 1.6* 1.0 0.6   Studies/Results: Ct Abdomen Pelvis W Contrast  Result Date: 01/05/2019 CLINICAL DATA:  Abdominal pain.  Pancreatitis suspected. EXAM: CT ABDOMEN AND PELVIS WITH CONTRAST TECHNIQUE: Multidetector CT imaging of the abdomen and pelvis was performed using the standard protocol following bolus administration of intravenous contrast. CONTRAST:  111m OMNIPAQUE IOHEXOL 300 MG/ML  SOLN COMPARISON:  04/09/2017 FINDINGS: Lower chest: Lung bases are clear. No effusions. Heart is normal size. Hepatobiliary: Severe diffuse fatty infiltration of the liver. Gallbladder is distended. Common bile duct dilated measuring up to 14 mm. Intrahepatic biliary ductal dilatation. No visible ductal stones. Pancreas: There is pancreatic ductal dilatation measuring up to 8 mm in the pancreatic head. There is inflammation around the pancreas, best seen around the pancreatic head and uncinate process most compatible with acute pancreatitis. Spleen: No focal abnormality.  Normal size. Adrenals/Urinary Tract: Small scattered cysts in the kidneys bilaterally. No hydronephrosis. Urinary bladder unremarkable. Stomach/Bowel: Large stool burden throughout the colon. No evidence of bowel obstruction. Vascular/Lymphatic: Aortic atherosclerosis. No enlarged abdominal or pelvic lymph nodes. Reproductive: Prior hysterectomy.  No adnexal masses. Other: No free fluid or free air. Musculoskeletal: No acute bony abnormality. IMPRESSION: Stranding noted around the pancreas, most pronounced around the pancreatic head and uncinate process. Findings most compatible with  acute pancreatitis. Dilated intrahepatic and extrahepatic biliary ducts as well as the pancreatic duct. Severe diffuse fatty infiltration of the liver. Small bilateral renal cysts. Aortic atherosclerosis. Electronically Signed   By: Rolm Baptise M.D.   On: 01/05/2019 19:51   US Abdomen Limited Ruq  Result Date: 01/06/2019 CLINICAL DATA:  Abdominal pain for 5 days. Patient admitted with pancreatitis. EXAM: ULTRASOUND ABDOMEN LIMITED RIGHT UPPER QUADRANT COMPARISON:  CT abdomen and pelvis 01/05/2019. FINDINGS: Gallbladder: The gallbladder is distended. No gallstones or wall thickening visualized. No sonographic Murphy sign noted by sonographer. Common bile duct: Diameter: 17 mm. Liver: Echogenicity is diffusely increased. No focal lesion. Intrahepatic biliary ductal dilatation is best seen in the left hepatic lobe as on prior CT. Portal vein is patent on color Doppler imaging with normal direction of blood flow towards the liver. Other: None. IMPRESSION: Negative for gallstones. No finding to explain the patient's intra and extrahepatic biliary ductal dilatation. Fatty infiltration of the liver. Please pick the correct US ABDOMEN LIMITED template. Electronically Signed   By: Inge Rise M.D.   On: 01/06/2019 11:28    Assessment: 63 year old female admitted with acute uncomplicated first episode of pancreatitis on 01/05/19, with normal LFTs initially, only slightly elevated bilirubin on admission now resolved. Denies any significant alcohol use. Triglycerides only 88. CT findings of CBD dilated to 14 mm, intrahepatic biliary ductal dilation, and pancreatic duct dilation measuring up to 8 mm. She is unable to complete MRI/MRCP due to implanted neurostimulator. Gallbladder present. RUQ [place] on 01/06/19 with gallbladder distended, no gallstones or wall thickening visualized. CBD 19m. Intrahepatic biliary ductal dilatation best seen in the left hepatic lobe as on prior CT.  Fatty infiltration of the liver. She  received NS IV at 2254mhr starting on 01/06/19 at 1300.   AST and ALT remain normal today. Alk phos slightly elevated at 128.Total bilirubin normal. Hematocrit and BUN were never elevated. Cr low at 0.37. Lipase trended down slightly yesterday from 191 to 149.  Patient has had minimal improvement thus far. Pain improved slightly, but continues to need Dilaudid every 3-4 hours. Nausea about the same. Vomited once this morning. No hematemesis. Remains tachycardic, although patient reports this has been present for a few months. She is without any signs or symtpoms of fluid overload. Due to minimal improvement, will continue IV fluids at 225 ml/hr for now. Will also continue NPO status for now. May be able to advance to clears tomorrow pending improvement in abdominal pain and nausea. May need to consider enteral feeding if she is unable to advance her diet in the near future as patient reports her last meal was 01/01/19.   She will need EUS outpatient as she is unable to have MRI to evaluate biliary and pancreatic ductal dilation. Of note, mother with history of cholangiocarcinoma, now deceased.   Plan: Continue NS IV at 225 ml/hr (weight based) for now to complete full 24 hours (1300 today). Will discuss with Dr. RoGala Romneyhe role of continuing aggressive IV fluid hydration after 24 hours as patient has had minimal improvement.  Remain NPO. Hopefully advance to clears tomorrow if clinically improving.  Continue IV Protonix 40 mg daily.  Continue supportive measures for pain control and nausea.  She will need EUS outpatient.   Addendum: Spoke with Dr. RoGala Romney  Will decrease fluids to more of a maintenance dose (150 ml/hr) starting at 1300 today. This completes full 24 hour aggressive fluid hydration.    LOS: 1 day    01/07/2019, 8:35 AM   Aliene Altes, PA-C [facility]

## 2019-01-07 NOTE — TOC Initial Note (Signed)
Transition of Care [facility]) - Initial/Assessment Note    Patient Details  Name: Jasmine [name] MRN: [id number] Date of Birth: Feb 03, 1956  [facility] CM/SW Contact:    Shade Flood, LCSW Phone Number: 01/07/2019, 11:58 AM  Clinical Narrative:                  Pt admitted from home. Pt is on Home O2. She was independent in ADLs prior to admission. Pt is able to obtain meds and get to appointments.   TOC will follow and continue to assess for dc needs.  Expected Discharge Plan: Home/Self Care Barriers to Discharge: Continued Medical Work up   Patient Goals and CMS Choice        Expected Discharge Plan and Services Expected Discharge Plan: Home/Self Care       Living arrangements for the past 2 months: Single Family Home                                      Prior Living Arrangements/Services Living arrangements for the past 2 months: Single Family Home Lives with:: Spouse Patient language and need for interpreter reviewed:: Yes Do you feel safe going back to the place where you live?: Yes      Need for Family Participation in Patient Care: No (Comment) Care giver support system in place?: Yes (comment)   Criminal Activity/Legal Involvement Pertinent to Current Situation/Hospitalization: No - Comment as needed  Activities of Daily Living Home Assistive Devices/Equipment: Scales, Shower chair with back, Bedside commode/3-in-1, Oxygen, Raised toilet seat with rails, Grab bars around toilet, Blood pressure cuff, Eyeglasses, Hand-held shower hose, Wheelchair, Grab bars in shower ADL Screening (condition at time of admission) Patient's cognitive ability adequate to safely complete daily activities?: Yes Is the patient deaf or have difficulty hearing?: No Does the patient have difficulty seeing, even when wearing glasses/contacts?: No Does the patient have difficulty concentrating, remembering, or making decisions?: No Patient able to express need for  assistance with ADLs?: Yes Does the patient have difficulty dressing or bathing?: Yes Independently performs ADLs?: No Communication: Independent Dressing (OT): Needs assistance Grooming: Needs assistance Is this a change from baseline?: Pre-admission baseline Feeding: Independent Bathing: Needs assistance Is this a change from baseline?: Pre-admission baseline Toileting: Independent In/Out Bed: Needs assistance Is this a change from baseline?: Pre-admission baseline Walks in Home: Needs assistance Is this a change from baseline?: Pre-admission baseline Does the patient have difficulty walking or climbing stairs?: No Weakness of Legs: Both Weakness of Arms/Hands: Both  Permission Sought/Granted                  Emotional Assessment Appearance:: Appears stated age     Orientation: : Oriented to Self, Oriented to Place, Oriented to  Time, Oriented to Situation Alcohol / Substance Use: Not Applicable Psych Involvement: No (comment)  Admission diagnosis:  Acute pancreatitis without infection or necrosis, unspecified pancreatitis type [K85.90] Patient Active Problem List   Diagnosis Date Noted  . Pancreatitis, acute 01/06/2019  . Acute pancreatitis 01/05/2019  . Cigarette smoker 08/25/2017  . Chronic respiratory failure with hypoxia ([place]) 08/25/2017  . Centrilobular emphysema ([place]) 07/09/2017  . Aortic atherosclerosis ([place]) 07/09/2017  . Hepatic steatosis 07/09/2017  . Malnutrition of moderate degree 07/02/2017  . Acute respiratory failure with hypoxia ([place]) 06/30/2017  . COPD with acute exacerbation ([place]) 06/30/2017  . 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/2016  . Tobacco  abuse 08/04/2016  . Iron deficiency anemia 07/11/2016  . Easy bruising 07/10/2016  . Lumbar stenosis 11/11/2015  . Leg pain 09/24/2015  . Vitamin D deficiency 07/29/2015  . Chronic back pain 02/22/2015  . HTN (hypertension) 02/22/2015  . Chronic narcotic use 02/22/2015  . COPD GOLD II   02/22/2015  . Headache 02/22/2015   PCP:  Janora Norlander, DO Pharmacy:   [place], [place] [address] [place] [address] Phone: [phone] Fax: [phone]  [facility] [address], [place] [address] Phone: [phone] Fax: [phone]  [place], [place] - [place] [address] [place] [address] [place] [address] Phone: [phone] Fax: [phone]     Social Determinants of Health (SDOH) Interventions    Readmission Risk Interventions Readmission Risk Prevention Plan 01/07/2019  Transportation Screening Complete  Palliative Care Screening Not Applicable  Medication Review (RN Care Manager) Complete  Some recent data might be hidden

## 2019-01-08 DIAGNOSIS — K8500 Idiopathic acute pancreatitis without necrosis or infection: Secondary | ICD-10-CM

## 2019-01-08 LAB — CBC
HCT: 37.4 % (ref 36.0–46.0)
Hemoglobin: 11.4 g/dL — ABNORMAL LOW (ref 12.0–15.0)
MCH: 32.9 pg (ref 26.0–34.0)
MCHC: 30.5 g/dL (ref 30.0–36.0)
MCV: 108.1 fL — ABNORMAL HIGH (ref 80.0–100.0)
Platelets: 224 10*3/uL (ref 150–400)
RBC: 3.46 MIL/uL — ABNORMAL LOW (ref 3.87–5.11)
RDW: 13.1 % (ref 11.5–15.5)
WBC: 10 10*3/uL (ref 4.0–10.5)
nRBC: 0 % (ref 0.0–0.2)

## 2019-01-08 LAB — RENAL FUNCTION PANEL
Albumin: 2.6 g/dL — ABNORMAL LOW (ref 3.5–5.0)
Anion gap: 13 (ref 5–15)
BUN: 6 mg/dL — ABNORMAL LOW (ref 8–23)
CO2: 21 mmol/L — ABNORMAL LOW (ref 22–32)
Calcium: 8.6 mg/dL — ABNORMAL LOW (ref 8.9–10.3)
Chloride: 104 mmol/L (ref 98–111)
Creatinine, Ser: 0.37 mg/dL — ABNORMAL LOW (ref 0.44–1.00)
GFR calc Af Amer: >60 mL/min (ref >60)
GFR calc non Af Amer: >60 mL/min (ref >60)
Glucose, Bld: 87 mg/dL (ref 70–99)
Phosphorus: 2.4 mg/dL — ABNORMAL LOW (ref 2.5–4.6)
Potassium: 3.3 mmol/L — ABNORMAL LOW (ref 3.5–5.1)
Sodium: 138 mmol/L (ref 135–145)

## 2019-01-08 LAB — GLUCOSE, CAPILLARY
Glucose-Capillary: 141 mg/dL — ABNORMAL HIGH (ref 70–99)
Glucose-Capillary: 54 mg/dL — ABNORMAL LOW (ref 70–99)
Glucose-Capillary: 72 mg/dL (ref 70–99)
Glucose-Capillary: 79 mg/dL (ref 70–99)
Glucose-Capillary: 94 mg/dL (ref 70–99)
Glucose-Capillary: 96 mg/dL (ref 70–99)

## 2019-01-08 MED ORDER — POTASSIUM CHLORIDE CRYS ER 20 MEQ PO TBCR
40.0000 meq | EXTENDED_RELEASE_TABLET | ORAL | Status: AC
  Administered 2019-01-08 (×2): 40 meq via ORAL
  Filled 2019-01-08 (×2): qty 2

## 2019-01-08 NOTE — Care Management Important Message (Signed)
Important Message  Patient Details  Name: Jasmine [name] MRN: [id number] Date of Birth: [birth year]   Medicare Important Message Given:  Yes     [name] 01/08/2019, 12:00 PM

## 2019-01-08 NOTE — Progress Notes (Signed)
Patient Demographics:    Jasmine [name], is a 63 y.o. female, DOB - 1955/05/13, AGT:[id number]  Admit date - 01/05/2019   Admitting Physician Ejiroghene Arlyce Dice, MD  Outpatient Primary MD for the patient is Janora Norlander, DO  LOS - 2   Chief Complaint  Patient presents with   Abdominal Pain        Subjective:    Jasmine [name] today has no fevers, No chest pain,   -PTA patient was on very high doses of hydrocodone from pain specialist  -Abdominal pain apparently improving since we adjusted her pain medications  Assessment  & Plan :    Active Problems:   Chronic back pain   HTN (hypertension)   Chronic narcotic use   Tobacco abuse   Centrilobular emphysema (HCC)   Acute pancreatitis   Pancreatitis, acute  Brief Summary 63 year old smoker/COPD, HTN, who denies heavy alcohol use admitted on 01/05/2019 with abdominal pain and found to have acute pancreatitis with dilatation of the intra and extrahepatic biliary ducts as well as pancreatic duct -Unable to do MRCP due to implanted neurostimulator -  A/p 1)Acute pancreatitis with dilatation of intra and extrahepatic biliary ducts, as well as Pancreatic duct Dilatation--- CT abdomen and pelvis and limited abdominal ultrasound report noted -GI consult appreciated -Okay to try clear liquid diet, -Continue IV fluids -Patient denies significant EtOH use -Triglycerides are only 88\-lipase down to 149 from 181 -WBC down to 10.0 from 16 -CT findings of CBD dilated to 14 mm, intrahepatic biliary ductal dilation, and pancreatic duct dilation measuring up to 8 mm. --Message Protonix to p.o. from IV if tolerating oral intake -IV Dilaudid as needed -PTA patient was on high doses of opiates -Requiring high doses of IV and oral opiates for pain control -Unable to do MRCP due to implanted neurostimulator -mother with history of cholangiocarcinoma,  now deceased -07-28-2022 need EUS as outpatient in about 6 weeks  2)FEN-significant electrolyte derangement -- replace and recheck, -Continue IV fluids  -EKG with prolonged QT in the setting of antidepressant use- monitor closely -Monitor glucose/fingersticks until oral intake improves  3)HTN--stable, continue amlodipine   may use IV labetalol when necessary  Every 4 hours for systolic blood pressure over 160 mmhg  4)Tobacco Abuse/COPD-not interested in smoking cessation, okay to give nicotine patch, may use albuterol as needed -Be judicious with bronchodilators due to prolonged QT   5)Chronic pain Syndrome/Depression/Anxiety--- continue Cymbalta 90 mg daily, continue as needed lorazepam -has implanted Neurostimulator   6)Fatty Liver--imaging studies noted, PTA patient was on Lipitor, lipid profile noted---  Disposition/Need for in-Hospital Stay- patient unable to be discharged at this time due to acute pancreatitis requiring IV fluids, IV narcotics  Code Status : Full code  Family Communication:   (patient is alert, awake and coherent) Discussed with Spouse --- Dr. Darliss Cheney- [phone]  Disposition Plan  :  TBD  Consults  :  Gi  DVT Prophylaxis  :  Lovenox -   - SCDs    Lab Results  Component Value Date   PLT 224 01/08/2019    Inpatient Medications  Scheduled Meds:  amLODipine  10 mg Oral Daily   DULoxetine  90 mg Oral Daily   enoxaparin (LOVENOX) injection  40 mg Subcutaneous Q24H  fluticasone furoate-vilanterol  1 puff Inhalation Daily   mouth rinse  15 mL Mouth Rinse BID   nicotine  14 mg Transdermal Daily   pantoprazole (PROTONIX) IV  40 mg Intravenous Q24H   umeclidinium bromide  1 puff Inhalation Daily   Continuous Infusions:  sodium chloride 150 mL/hr at 01/08/19 1351   PRN Meds:.acetaminophen **OR** acetaminophen, albuterol, HYDROmorphone (DILAUDID) injection, labetalol, LORazepam, oxyCODONE, polyethylene glycol,  promethazine    Anti-infectives (From admission, onward)   None        Objective:   Vitals:   01/08/19 0517 01/08/19 0730 01/08/19 0735 01/08/19 1310  BP: 118/79   104/68  Pulse: 88   99  Resp: 18   16  Temp: 98.2 F (36.8 C)   98.3 F (36.8 C)  TempSrc: Oral   Oral  SpO2: 98% 100% 100% 97%  Weight:      Height:        Wt Readings from Last 3 Encounters:  01/05/19 42.2 kg  12/02/18 45.7 kg  01/09/18 47.9 kg     Intake/Output Summary (Last 24 hours) at 01/08/2019 1922 Last data filed at 01/08/2019 1700 Gross per 24 hour  Intake 1480 ml  Output 3700 ml  Net -2220 ml    Physical Exam  Gen:- Awake Alert, resting comfortably HEENT:- Ages.AT, No sclera icterus Neck-Supple Neck,No JVD,.  Lungs-  CTAB , fair symmetrical air movement CV- S1, S2 normal, regular  Abd-  +ve B.Sounds, Abd Soft, less tender, no rebound or guarding  extremity/Skin:- No  edema, pedal pulses present  Psych-affect is appropriate, oriented x3 Neuro-no new focal deficits, no tremors   Data Review:   Micro Results Recent Results (from the past 240 hour(s))  SARS CORONAVIRUS 2 (TAT 6-24 HRS) Nasopharyngeal Nasopharyngeal Swab     Status: None   Collection Time: 01/05/19  7:43 PM   Specimen: Nasopharyngeal Swab  Result Value Ref Range Status   SARS Coronavirus 2 NEGATIVE NEGATIVE Final    Comment: (NOTE) SARS-CoV-2 target nucleic acids are NOT DETECTED. The SARS-CoV-2 RNA is generally detectable in upper and lower respiratory specimens during the acute phase of infection. Negative results do not preclude SARS-CoV-2 infection, do not rule out co-infections with other pathogens, and should not be used as the sole basis for treatment or other patient management decisions. Negative results must be combined with clinical observations, patient history, and epidemiological information. The expected result is Negative. Fact Sheet for Patients: SugarRoll.be Fact Sheet  for Healthcare Providers: https://www.woods-mathews.com/ This test is not yet approved or cleared by the [place] FDA and  has been authorized for detection and/or diagnosis of SARS-CoV-2 by FDA under an Emergency Use Authorization (EUA). This EUA will remain  in effect (meaning this test can be used) for the duration of the COVID-19 declaration under Section 56 4(b)(1) of the Act, 21 U.S.C. section 360bbb-3(b)(1), unless the authorization is terminated or revoked sooner. Performed at [facility], [place] [address], [place], [place] [address]     Radiology Reports Ct Abdomen Pelvis W Contrast  Result Date: 01/05/2019 CLINICAL DATA:  Abdominal pain.  Pancreatitis suspected. EXAM: CT ABDOMEN AND PELVIS WITH CONTRAST TECHNIQUE: Multidetector CT imaging of the abdomen and pelvis was performed using the standard protocol following bolus administration of intravenous contrast. CONTRAST:  17mL OMNIPAQUE IOHEXOL 300 MG/ML  SOLN COMPARISON:  04/09/2017 FINDINGS: Lower chest: Lung bases are clear. No effusions. Heart is normal size. Hepatobiliary: Severe diffuse fatty infiltration of the liver. Gallbladder is distended. Common  bile duct dilated measuring up to 14 mm. Intrahepatic biliary ductal dilatation. No visible ductal stones. Pancreas: There is pancreatic ductal dilatation measuring up to 8 mm in the pancreatic head. There is inflammation around the pancreas, best seen around the pancreatic head and uncinate process most compatible with acute pancreatitis. Spleen: No focal abnormality.  Normal size. Adrenals/Urinary Tract: Small scattered cysts in the kidneys bilaterally. No hydronephrosis. Urinary bladder unremarkable. Stomach/Bowel: Large stool burden throughout the colon. No evidence of bowel obstruction. Vascular/Lymphatic: Aortic atherosclerosis. No enlarged abdominal or pelvic lymph nodes. Reproductive: Prior hysterectomy.  No adnexal masses. Other: No free fluid or free air.  Musculoskeletal: No acute bony abnormality. IMPRESSION: Stranding noted around the pancreas, most pronounced around the pancreatic head and uncinate process. Findings most compatible with acute pancreatitis. Dilated intrahepatic and extrahepatic biliary ducts as well as the pancreatic duct. Severe diffuse fatty infiltration of the liver. Small bilateral renal cysts. Aortic atherosclerosis. Electronically Signed   By: Rolm Baptise M.D.   On: 01/05/2019 19:51   US Abdomen Limited Ruq  Result Date: 01/06/2019 CLINICAL DATA:  Abdominal pain for 5 days. Patient admitted with pancreatitis. EXAM: ULTRASOUND ABDOMEN LIMITED RIGHT UPPER QUADRANT COMPARISON:  CT abdomen and pelvis 01/05/2019. FINDINGS: Gallbladder: The gallbladder is distended. No gallstones or wall thickening visualized. No sonographic Murphy sign noted by sonographer. Common bile duct: Diameter: 17 mm. Liver: Echogenicity is diffusely increased. No focal lesion. Intrahepatic biliary ductal dilatation is best seen in the left hepatic lobe as on prior CT. Portal vein is patent on color Doppler imaging with normal direction of blood flow towards the liver. Other: None. IMPRESSION: Negative for gallstones. No finding to explain the patient's intra and extrahepatic biliary ductal dilatation. Fatty infiltration of the liver. Please pick the correct US ABDOMEN LIMITED template. Electronically Signed   By: Inge Rise M.D.   On: 01/06/2019 11:28     CBC Recent Labs  Lab 01/05/19 1454 01/06/19 0552 01/07/19 0521 01/08/19 0453  WBC 16.2* 14.4* 14.1* 10.0  HGB 13.8 12.3 12.1 11.4*  HCT 43.4 39.0 39.5 37.4  PLT 252 237 210 224  MCV 104.8* 104.0* 106.5* 108.1*  MCH 33.3 32.8 32.6 32.9  MCHC 31.8 31.5 30.6 30.5  RDW 12.8 12.9 13.1 13.1  LYMPHSABS 0.8  --   --   --   MONOABS 1.5*  --   --   --   EOSABS 0.1  --   --   --   BASOSABS 0.1  --   --   --     Chemistries  Recent Labs  Lab 01/05/19 1454 01/06/19 0552 01/06/19 0657  01/07/19 0521 01/08/19 0453  NA 136 136  --  139 138  K 2.4* 2.6*  --  3.4* 3.3*  CL 90* 95*  --  104 104  CO2 26 27  --  25 21*  GLUCOSE 121* 197*  --  144* 87  BUN 11 6*  --  6* 6*  CREATININE 0.62 0.39*  --  0.37* 0.37*  CALCIUM 9.8 8.5*  --  8.3* 8.6*  MG 2.1  --  1.5*  --   --   AST 25 22  --  32  --   ALT 24 22  --  23  --   ALKPHOS 94 83  --  128*  --   BILITOT 1.6* 1.0  --  0.6  --    ------------------------------------------------------------------------------------------------------------------ Recent Labs    01/06/19 0552  CHOL 180  HDL 85  LDLCALC 77  TRIG 88  CHOLHDL 2.1    No results found for: HGBA1C ------------------------------------------------------------------------------------------------------------------ No results for input(s): TSH, T4TOTAL, T3FREE, THYROIDAB in the last 72 hours.  Invalid input(s): FREET3 ------------------------------------------------------------------------------------------------------------------ No results for input(s): VITAMINB12, FOLATE, FERRITIN, TIBC, IRON, RETICCTPCT in the last 72 hours.  Coagulation profile No results for input(s): INR, PROTIME in the last 168 hours.  No results for input(s): DDIMER in the last 72 hours.  Cardiac Enzymes No results for input(s): CKMB, TROPONINI, MYOGLOBIN in the last 168 hours.  Invalid input(s): CK ------------------------------------------------------------------------------------------------------------------ No results found for: BNP   Roxan Hockey M.D on 01/08/2019 at 7:22 PM  Go to [email or web address] - for contact info  [facility] - Office  [phone]

## 2019-01-08 NOTE — Progress Notes (Signed)
Has tolerated clear liquid diet today with no c/o nausea.  Continues to complain of pain and request dilaudid q3 hours.

## 2019-01-08 NOTE — Progress Notes (Addendum)
    Subjective: Feels much better today. Pain improved from admission. Still able to tell when time for pain medication but not as severe. Not radiating to her back. No N/V. Wants to try drinking some liquids.   Objective: Vital signs in last 24 hours: Temp:  [98.1 F (36.7 C)-99.1 F (37.3 C)] 98.2 F (36.8 C) (11/04 0517) Pulse Rate:  [88-109] 88 (11/04 0517) Resp:  [18-20] 18 (11/04 0517) BP: (118-140)/(79-95) 118/79 (11/04 0517) SpO2:  [98 %-100 %] 100 % (11/04 0735) Last BM Date: 01/07/19 General:   Alert and oriented, pleasant Abdomen:  Bowel sounds present, soft, TTP RLQ, non-distended. No HSM or hernias noted. No rebound or guarding. No masses appreciated  Extremities:  Without  edema. Neurologic:  Alert and  oriented x4 Psych:  Normal mood and affect.  Intake/Output from previous day: 11/03 0701 - 11/04 0700 In: 1000 [I.V.:1000] Out: 3500 [Urine:3500] Intake/Output this shift: No intake/output data recorded.  Lab Results: Recent Labs    01/06/19 0552 01/07/19 0521 01/08/19 0453  WBC 14.4* 14.1* 10.0  HGB 12.3 12.1 11.4*  HCT 39.0 39.5 37.4  PLT 237 210 224   BMET Recent Labs    01/06/19 0552 01/07/19 0521 01/08/19 0453  NA 136 139 138  K 2.6* 3.4* 3.3*  CL 95* 104 104  CO2 27 25 21*  GLUCOSE 197* 144* 87  BUN 6* 6* 6*  CREATININE 0.39* 0.37* 0.37*  CALCIUM 8.5* 8.3* 8.6*   LFT Recent Labs    01/05/19 1454 01/06/19 0552 01/07/19 0521 01/08/19 0453  PROT 7.3 6.3* 6.0*  --   ALBUMIN 3.6 3.1* 2.8* 2.6*  AST 25 22 32  --   ALT 24 22 23   --   ALKPHOS 94 83 128*  --   BILITOT 1.6* 1.0 0.6  --      Studies/Results: US Abdomen Limited Ruq  Result Date: 01/06/2019 CLINICAL DATA:  Abdominal pain for 5 days. Patient admitted with pancreatitis. EXAM: ULTRASOUND ABDOMEN LIMITED RIGHT UPPER QUADRANT COMPARISON:  CT abdomen and pelvis 01/05/2019. FINDINGS: Gallbladder: The gallbladder is distended. No gallstones or wall thickening visualized. No  sonographic Murphy sign noted by sonographer. Common bile duct: Diameter: 17 mm. Liver: Echogenicity is diffusely increased. No focal lesion. Intrahepatic biliary ductal dilatation is best seen in the left hepatic lobe as on prior CT. Portal vein is patent on color Doppler imaging with normal direction of blood flow towards the liver. Other: None. IMPRESSION: Negative for gallstones. No finding to explain the patient's intra and extrahepatic biliary ductal dilatation. Fatty infiltration of the liver. Please pick the correct US ABDOMEN LIMITED template. Electronically Signed   By: Inge Rise M.D.   On: 01/06/2019 11:28    Assessment: 63 year old female admitted with acute uncomplicated pancreatitis, clinically improved from admission. Objectively, she appears markedly improved from time of consultation and in no distress. Appropriate for advancement to clear liquids.   CT findings of CBD dilated to 14 mm, intrahepatic biliary ductal dilation, and pancreatic duct dilation measuring up to 8 mm. Unable to have MRI. LFTs normal. RUQ [place] on file. Will need EUS as outpatient.   Plan: Clear liquids today PPI daily: change to oral once tolerating diet Continue potassium supplementation HFP, lipase in morning Will need EUS as outpatient: we will arrange   Jasmine Needs, PhD, ANP-BC [facility]    LOS: 2 days    01/08/2019, 9:46 AM

## 2019-01-09 ENCOUNTER — Telehealth: Payer: Self-pay | Admitting: Gastroenterology

## 2019-01-09 DIAGNOSIS — K8500 Idiopathic acute pancreatitis without necrosis or infection: Secondary | ICD-10-CM

## 2019-01-09 LAB — HEPATIC FUNCTION PANEL
ALT: 21 U/L (ref 0–44)
AST: 24 U/L (ref 15–41)
Albumin: 2.6 g/dL — ABNORMAL LOW (ref 3.5–5.0)
Alkaline Phosphatase: 91 U/L (ref 38–126)
Bilirubin, Direct: 0.2 mg/dL (ref 0.0–0.2)
Indirect Bilirubin: 0.3 mg/dL (ref 0.3–0.9)
Total Bilirubin: 0.5 mg/dL (ref 0.3–1.2)
Total Protein: 5.4 g/dL — ABNORMAL LOW (ref 6.5–8.1)

## 2019-01-09 LAB — GLUCOSE, CAPILLARY
Glucose-Capillary: 149 mg/dL — ABNORMAL HIGH (ref 70–99)
Glucose-Capillary: 152 mg/dL — ABNORMAL HIGH (ref 70–99)
Glucose-Capillary: 162 mg/dL — ABNORMAL HIGH (ref 70–99)
Glucose-Capillary: 86 mg/dL (ref 70–99)
Glucose-Capillary: 92 mg/dL (ref 70–99)
Glucose-Capillary: 98 mg/dL (ref 70–99)

## 2019-01-09 LAB — LIPASE, BLOOD: Lipase: 27 U/L (ref 11–51)

## 2019-01-09 MED ORDER — PANTOPRAZOLE SODIUM 40 MG PO TBEC
40.0000 mg | DELAYED_RELEASE_TABLET | Freq: Every day | ORAL | Status: DC
  Administered 2019-01-10: 40 mg via ORAL
  Filled 2019-01-09: qty 1

## 2019-01-09 NOTE — Progress Notes (Signed)
Patient Demographics:    Jasmine [name], is a 62 y.o. female, DOB - 1956-02-11, NOI:[id number]  Admit date - 01/05/2019   Admitting Physician Ejiroghene Arlyce Dice, MD  Outpatient Primary MD for the patient is Janora Norlander, DO  LOS - 3   Chief Complaint  Patient presents with   Abdominal Pain        Subjective:    Jasmine [name] today has no fevers, No chest pain,   -PTA patient was on very high doses of hydrocodone from pain specialist And the pain continues to improve, patient willing to try full liquid diet  Assessment  & Plan :    Active Problems:   Chronic back pain   HTN (hypertension)   Chronic narcotic use   Tobacco abuse   Centrilobular emphysema (HCC)   Acute pancreatitis   Pancreatitis, acute  Brief Summary 63 year old smoker/COPD, HTN, who denies heavy alcohol use admitted on 01/05/2019 with abdominal pain and found to have acute pancreatitis with dilatation of the intra and extrahepatic biliary ducts as well as pancreatic duct -Unable to do MRCP due to implanted neurostimulator -  A/p 1)Acute pancreatitis with dilatation of intra and extrahepatic biliary ducts, as well as Pancreatic duct Dilatation--- CT abdomen and pelvis and limited abdominal ultrasound report noted -GI consult appreciated -Continue IV fluids -Patient denies significant EtOH use -Triglycerides are only 88\-lipase down to 149 from 181 -WBC down to 10.0 from 16 -CT findings of CBD dilated to 14 mm, intrahepatic biliary ductal dilation, and pancreatic duct dilation measuring up to 8 mm. --c/n  Protonix   -IV Dilaudid as needed -PTA patient was on high doses of opiates --Abdominal pain improving requiring less narcotics  -Unable to do MRCP due to implanted neurostimulator -mother with history of cholangiocarcinoma, now deceased -EUS with Dr. Ardis Hughs or [facility] for further evaluation of idiopathic  pancreatitis, biliary and pancreatic duct dilation- in about 6 weeks as outpatient  2)FEN-significant electrolyte derangement -- replace and recheck, -Continue IV fluids  -EKG with prolonged QT in the setting of antidepressant use- monitor closely -Monitor glucose/fingersticks until oral intake improves  3)HTN--stable, continue amlodipine   may use IV labetalol when necessary  Every 4 hours for systolic blood pressure over 160 mmhg  4)Tobacco Abuse/COPD-not interested in smoking cessation, okay to give nicotine patch, may use albuterol as needed -Be judicious with bronchodilators due to prolonged QT   5)Chronic pain Syndrome/Depression/Anxiety--- continue Cymbalta 90 mg daily, continue as needed lorazepam -has implanted Neurostimulator   6)Fatty Liver--imaging studies noted, PTA patient was on Lipitor, lipid profile noted---  Disposition/Need for in-Hospital Stay- patient unable to be discharged at this time due to acute pancreatitis requiring IV fluids, IV narcotics  Code Status : Full code  Family Communication:   (patient is alert, awake and coherent) Discussed with Spouse --- Dr. Darliss [phone]  Disposition Plan  : Home in 1 to 2 days if continues to improve  Consults  :  Gi  DVT Prophylaxis  :  Lovenox -   - SCDs    Lab Results  Component Value Date   PLT 224 01/08/2019    Inpatient Medications  Scheduled Meds:  amLODipine  10 mg Oral Daily   DULoxetine  90 mg Oral Daily  enoxaparin (LOVENOX) injection  40 mg Subcutaneous Q24H   fluticasone furoate-vilanterol  1 puff Inhalation Daily   mouth rinse  15 mL Mouth Rinse BID   nicotine  14 mg Transdermal Daily   [START ON 01/10/2019] pantoprazole  40 mg Oral QAC breakfast   umeclidinium bromide  1 puff Inhalation Daily   Continuous Infusions:  sodium chloride 150 mL/hr at 01/09/19 1500   PRN Meds:.acetaminophen **OR** acetaminophen, albuterol, HYDROmorphone (DILAUDID) injection, labetalol,  LORazepam, oxyCODONE, polyethylene glycol, promethazine    Anti-infectives (From admission, onward)   None        Objective:   Vitals:   01/09/19 0714 01/09/19 0720 01/09/19 1443 01/09/19 1620  BP:   133/88 137/82  Pulse:   85 84  Resp:      Temp:   97.9 F (36.6 C)   TempSrc:   Oral   SpO2: 100% 100% 100%   Weight:      Height:        Wt Readings from Last 3 Encounters:  01/05/19 42.2 kg  12/02/18 45.7 kg  01/09/18 47.9 kg     Intake/Output Summary (Last 24 hours) at 01/09/2019 1710 Last data filed at 01/09/2019 1500 Gross per 24 hour  Intake 5570.48 ml  Output 4000 ml  Net 1570.48 ml    Physical Exam  Gen:- Awake Alert, resting comfortably HEENT:- [place].AT, No sclera icterus Neck-Supple Neck,No JVD,.  Lungs-  CTAB , fair symmetrical air movement CV- S1, S2 normal, regular  Abd-  +ve B.Sounds, Abd Soft, improving abdominal tenderness, no rebound or guarding  extremity/Skin:- No  edema, pedal pulses present  Psych-affect is appropriate, oriented x3 Neuro-no new focal deficits, no tremors   Data Review:   Micro Results Recent Results (from the past 240 hour(s))  SARS CORONAVIRUS 2 (TAT 6-24 HRS) Nasopharyngeal Nasopharyngeal Swab     Status: None   Collection Time: 01/05/19  7:43 PM   Specimen: Nasopharyngeal Swab  Result Value Ref Range Status   SARS Coronavirus 2 NEGATIVE NEGATIVE Final    Comment: (NOTE) SARS-CoV-2 target nucleic acids are NOT DETECTED. The SARS-CoV-2 RNA is generally detectable in upper and lower respiratory specimens during the acute phase of infection. Negative results do not preclude SARS-CoV-2 infection, do not rule out co-infections with other pathogens, and should not be used as the sole basis for treatment or other patient management decisions. Negative results must be combined with clinical observations, patient history, and epidemiological information. The expected result is Negative. Fact Sheet for  Patients: SugarRoll.be Fact Sheet for Healthcare Providers: https://www.woods-mathews.com/ This test is not yet approved or cleared by the [place] FDA and  has been authorized for detection and/or diagnosis of SARS-CoV-2 by FDA under an Emergency Use Authorization (EUA). This EUA will remain  in effect (meaning this test can be used) for the duration of the COVID-19 declaration under Section 56 4(b)(1) of the Act, 21 U.S.C. section 360bbb-3(b)(1), unless the authorization is terminated or revoked sooner. Performed at [facility], [place] [address], [place], [place] [address]     Radiology Reports Ct Abdomen Pelvis W Contrast  Result Date: 01/05/2019 CLINICAL DATA:  Abdominal pain.  Pancreatitis suspected. EXAM: CT ABDOMEN AND PELVIS WITH CONTRAST TECHNIQUE: Multidetector CT imaging of the abdomen and pelvis was performed using the standard protocol following bolus administration of intravenous contrast. CONTRAST:  124mL OMNIPAQUE IOHEXOL 300 MG/ML  SOLN COMPARISON:  04/09/2017 FINDINGS: Lower chest: Lung bases are clear. No effusions. Heart is normal size. Hepatobiliary: Severe  diffuse fatty infiltration of the liver. Gallbladder is distended. Common bile duct dilated measuring up to 14 mm. Intrahepatic biliary ductal dilatation. No visible ductal stones. Pancreas: There is pancreatic ductal dilatation measuring up to 8 mm in the pancreatic head. There is inflammation around the pancreas, best seen around the pancreatic head and uncinate process most compatible with acute pancreatitis. Spleen: No focal abnormality.  Normal size. Adrenals/Urinary Tract: Small scattered cysts in the kidneys bilaterally. No hydronephrosis. Urinary bladder unremarkable. Stomach/Bowel: Large stool burden throughout the colon. No evidence of bowel obstruction. Vascular/Lymphatic: Aortic atherosclerosis. No enlarged abdominal or pelvic lymph nodes. Reproductive: Prior  hysterectomy.  No adnexal masses. Other: No free fluid or free air. Musculoskeletal: No acute bony abnormality. IMPRESSION: Stranding noted around the pancreas, most pronounced around the pancreatic head and uncinate process. Findings most compatible with acute pancreatitis. Dilated intrahepatic and extrahepatic biliary ducts as well as the pancreatic duct. Severe diffuse fatty infiltration of the liver. Small bilateral renal cysts. Aortic atherosclerosis. Electronically Signed   By: Rolm Baptise M.D.   On: 01/05/2019 19:51   US Abdomen Limited Ruq  Result Date: 01/06/2019 CLINICAL DATA:  Abdominal pain for 5 days. Patient admitted with pancreatitis. EXAM: ULTRASOUND ABDOMEN LIMITED RIGHT UPPER QUADRANT COMPARISON:  CT abdomen and pelvis 01/05/2019. FINDINGS: Gallbladder: The gallbladder is distended. No gallstones or wall thickening visualized. No sonographic Murphy sign noted by sonographer. Common bile duct: Diameter: 17 mm. Liver: Echogenicity is diffusely increased. No focal lesion. Intrahepatic biliary ductal dilatation is best seen in the left hepatic lobe as on prior CT. Portal vein is patent on color Doppler imaging with normal direction of blood flow towards the liver. Other: None. IMPRESSION: Negative for gallstones. No finding to explain the patient's intra and extrahepatic biliary ductal dilatation. Fatty infiltration of the liver. Please pick the correct US ABDOMEN LIMITED template. Electronically Signed   By: Inge Rise M.D.   On: 01/06/2019 11:28     CBC Recent Labs  Lab 01/05/19 1454 01/06/19 0552 01/07/19 0521 01/08/19 0453  WBC 16.2* 14.4* 14.1* 10.0  HGB 13.8 12.3 12.1 11.4*  HCT 43.4 39.0 39.5 37.4  PLT 252 237 210 224  MCV 104.8* 104.0* 106.5* 108.1*  MCH 33.3 32.8 32.6 32.9  MCHC 31.8 31.5 30.6 30.5  RDW 12.8 12.9 13.1 13.1  LYMPHSABS 0.8  --   --   --   MONOABS 1.5*  --   --   --   EOSABS 0.1  --   --   --   BASOSABS 0.1  --   --   --     Chemistries  Recent  Labs  Lab 01/05/19 1454 01/06/19 0552 01/06/19 0657 01/07/19 0521 01/08/19 0453 01/09/19 0553  NA 136 136  --  139 138  --   K 2.4* 2.6*  --  3.4* 3.3*  --   CL 90* 95*  --  104 104  --   CO2 26 27  --  25 21*  --   GLUCOSE 121* 197*  --  144* 87  --   BUN 11 6*  --  6* 6*  --   CREATININE 0.62 0.39*  --  0.37* 0.37*  --   CALCIUM 9.8 8.5*  --  8.3* 8.6*  --   MG 2.1  --  1.5*  --   --   --   AST 25 22  --  32  --  24  ALT 24 22  --  23  --  21  ALKPHOS 94 83  --  128*  --  91  BILITOT 1.6* 1.0  --  0.6  --  0.5   ------------------------------------------------------------------------------------------------------------------ No results for input(s): CHOL, HDL, LDLCALC, TRIG, CHOLHDL, LDLDIRECT in the last 72 hours.  No results found for: HGBA1C ------------------------------------------------------------------------------------------------------------------ No results for input(s): TSH, T4TOTAL, T3FREE, THYROIDAB in the last 72 hours.  Invalid input(s): FREET3 ------------------------------------------------------------------------------------------------------------------ No results for input(s): VITAMINB12, FOLATE, FERRITIN, TIBC, IRON, RETICCTPCT in the last 72 hours.  Coagulation profile No results for input(s): INR, PROTIME in the last 168 hours.  No results for input(s): DDIMER in the last 72 hours.  Cardiac Enzymes No results for input(s): CKMB, TROPONINI, MYOGLOBIN in the last 168 hours.  Invalid input(s): CK ------------------------------------------------------------------------------------------------------------------ No results found for: BNP   Roxan Hockey M.D on 01/09/2019 at 5:10 PM  Go to [email or web address] - for contact info  [facility] - Office  [phone]

## 2019-01-09 NOTE — Progress Notes (Addendum)
Subjective:  Feeling much better.  Pain improved.  Diet being advanced to full liquids for breakfast.  No nausea or vomiting.  Has had a couple of loose stools.  Objective: Vital signs in last 24 hours: Temp:  [97.8 F (36.6 C)-98.3 F (36.8 C)] 97.8 F (36.6 C) (11/05 0536) Pulse Rate:  [83-99] 83 (11/05 0536) Resp:  [16-20] 18 (11/05 0536) BP: (104-135)/(68-92) 135/79 (11/05 0536) SpO2:  [97 %-100 %] 100 % (11/05 0720) Last BM Date: 01/09/19 General:   Alert,  Well-developed, well-nourished, pleasant and cooperative in NAD Head:  Normocephalic and atraumatic. Eyes:  Sclera clear, no icterus.  Abdomen:  Soft, mild upper abdominal tenderness. Normal bowel sounds, without guarding, and without rebound.   Extremities:  Without clubbing, deformity or edema. Neurologic:  Alert and  oriented x4;  grossly normal neurologically. Skin:  Intact without significant lesions or rashes. Psych:  Alert and cooperative. Normal mood and affect.  Intake/Output from previous day: 11/04 0701 - 11/05 0700 In: 480 [P.O.:480] Out: 3300 [Urine:3300] Intake/Output this shift: Total I/O In: -  Out: 800 [Urine:800]  Lab Results: CBC Recent Labs    01/07/19 0521 01/08/19 0453  WBC 14.1* 10.0  HGB 12.1 11.4*  HCT 39.5 37.4  MCV 106.5* 108.1*  PLT 210 224   BMET Recent Labs    01/07/19 0521 01/08/19 0453  NA 139 138  K 3.4* 3.3*  CL 104 104  CO2 25 21*  GLUCOSE 144* 87  BUN 6* 6*  CREATININE 0.37* 0.37*  CALCIUM 8.3* 8.6*   LFTs Recent Labs    01/07/19 0521 01/08/19 0453 01/09/19 0553  BILITOT 0.6  --  0.5  BILIDIR  --   --  0.2  IBILI  --   --  0.3  ALKPHOS 128*  --  91  AST 32  --  24  ALT 23  --  21  PROT 6.0*  --  5.4*  ALBUMIN 2.8* 2.6* 2.6*   Recent Labs    01/09/19 0553  LIPASE 27   PT/INR No results for input(s): LABPROT, INR in the last 72 hours.    Imaging Studies: Ct Abdomen Pelvis W Contrast  Result Date: 01/05/2019 CLINICAL DATA:  Abdominal pain.   Pancreatitis suspected. EXAM: CT ABDOMEN AND PELVIS WITH CONTRAST TECHNIQUE: Multidetector CT imaging of the abdomen and pelvis was performed using the standard protocol following bolus administration of intravenous contrast. CONTRAST:  136mL OMNIPAQUE IOHEXOL 300 MG/ML  SOLN COMPARISON:  04/09/2017 FINDINGS: Lower chest: Lung bases are clear. No effusions. Heart is normal size. Hepatobiliary: Severe diffuse fatty infiltration of the liver. Gallbladder is distended. Common bile duct dilated measuring up to 14 mm. Intrahepatic biliary ductal dilatation. No visible ductal stones. Pancreas: There is pancreatic ductal dilatation measuring up to 8 mm in the pancreatic head. There is inflammation around the pancreas, best seen around the pancreatic head and uncinate process most compatible with acute pancreatitis. Spleen: No focal abnormality.  Normal size. Adrenals/Urinary Tract: Small scattered cysts in the kidneys bilaterally. No hydronephrosis. Urinary bladder unremarkable. Stomach/Bowel: Large stool burden throughout the colon. No evidence of bowel obstruction. Vascular/Lymphatic: Aortic atherosclerosis. No enlarged abdominal or pelvic lymph nodes. Reproductive: Prior hysterectomy.  No adnexal masses. Other: No free fluid or free air. Musculoskeletal: No acute bony abnormality. IMPRESSION: Stranding noted around the pancreas, most pronounced around the pancreatic head and uncinate process. Findings most compatible with acute pancreatitis. Dilated intrahepatic and extrahepatic biliary ducts as well as the pancreatic duct. Severe diffuse fatty infiltration  of the liver. Small bilateral renal cysts. Aortic atherosclerosis. Electronically Signed   By: Rolm Baptise M.D.   On: 01/05/2019 19:51   US Abdomen Limited Ruq  Result Date: 01/06/2019 CLINICAL DATA:  Abdominal pain for 5 days. Patient admitted with pancreatitis. EXAM: ULTRASOUND ABDOMEN LIMITED RIGHT UPPER QUADRANT COMPARISON:  CT abdomen and pelvis 01/05/2019.  FINDINGS: Gallbladder: The gallbladder is distended. No gallstones or wall thickening visualized. No sonographic Murphy sign noted by sonographer. Common bile duct: Diameter: 17 mm. Liver: Echogenicity is diffusely increased. No focal lesion. Intrahepatic biliary ductal dilatation is best seen in the left hepatic lobe as on prior CT. Portal vein is patent on color Doppler imaging with normal direction of blood flow towards the liver. Other: None. IMPRESSION: Negative for gallstones. No finding to explain the patient's intra and extrahepatic biliary ductal dilatation. Fatty infiltration of the liver. Please pick the correct US ABDOMEN LIMITED template. Electronically Signed   By: Inge Rise M.D.   On: 01/06/2019 11:28  [2 weeks]   Assessment: 63 year old female admitted with acute uncomplicated pancreatitis, clinically improving since admission.  CT findings of common bile duct dilated to 14 mm, intrahepatic biliary ductal dilation, pancreatic duct dilation measuring up to 8 mm. Unable to have MRI due to implanted neurostimulator.  LFTs normal.  Right upper quadrant ultrasound showing no gallstones, gallbladder distended, CBD 17 mm.  Plan for outpatient EUS.  Use of Dilaudid has decreased.   Plan: 1. EUS in about 6 weeks to further evaluate as to the etiology of pancreatitis. 2. Change PPI to oral. 3. Hopefully home in the next 24 hours.  Laureen Ochs. Bernarda Caffey [facility] [phone] 11/5/202010:18 AM      LOS: 3 days

## 2019-01-09 NOTE — Telephone Encounter (Signed)
Referral sent 

## 2019-01-09 NOTE — Plan of Care (Signed)
  Problem: Education: Goal: Knowledge of Pancreatitis treatment and prevention will improve Outcome: Progressing   Problem: Health Behavior/Discharge Planning: Goal: Ability to formulate a plan to maintain an alcohol-free life will improve Outcome: Progressing   Problem: Nutritional: Goal: Ability to achieve adequate nutritional intake will improve Outcome: Progressing   Problem: Clinical Measurements: Goal: Complications related to the disease process, condition or treatment will be avoided or minimized Outcome: Progressing   

## 2019-01-09 NOTE — Telephone Encounter (Signed)
Please make arrangements for patient to have a EUS with Dr. Ardis Hughs or Associates for further evaluation of idiopathic pancreatitis, biliary and pancreatic duct dilation.

## 2019-01-09 NOTE — Addendum Note (Signed)
Addended by: Cheron Every on: 01/09/2019 10:26 AM   Modules accepted: Orders

## 2019-01-10 DIAGNOSIS — G8929 Other chronic pain: Secondary | ICD-10-CM

## 2019-01-10 DIAGNOSIS — M5441 Lumbago with sciatica, right side: Secondary | ICD-10-CM

## 2019-01-10 LAB — BASIC METABOLIC PANEL
Anion gap: 10 (ref 5–15)
BUN: <5 mg/dL — ABNORMAL LOW (ref 8–23)
CO2: 30 mmol/L (ref 22–32)
Calcium: 8.5 mg/dL — ABNORMAL LOW (ref 8.9–10.3)
Chloride: 101 mmol/L (ref 98–111)
Creatinine, Ser: <0.3 mg/dL — ABNORMAL LOW (ref 0.44–1.00)
Glucose, Bld: 103 mg/dL — ABNORMAL HIGH (ref 70–99)
Potassium: 2.7 mmol/L — CL (ref 3.5–5.1)
Sodium: 141 mmol/L (ref 135–145)

## 2019-01-10 LAB — CBC
HCT: 39.4 % (ref 36.0–46.0)
Hemoglobin: 12.1 g/dL (ref 12.0–15.0)
MCH: 32.6 pg (ref 26.0–34.0)
MCHC: 30.7 g/dL (ref 30.0–36.0)
MCV: 106.2 fL — ABNORMAL HIGH (ref 80.0–100.0)
Platelets: 228 10*3/uL (ref 150–400)
RBC: 3.71 MIL/uL — ABNORMAL LOW (ref 3.87–5.11)
RDW: 12.5 % (ref 11.5–15.5)
WBC: 4.1 10*3/uL (ref 4.0–10.5)
nRBC: 0 % (ref 0.0–0.2)

## 2019-01-10 LAB — VITAMIN B12: Vitamin B-12: 3525 pg/mL — ABNORMAL HIGH (ref 180–914)

## 2019-01-10 MED ORDER — SPIRIVA RESPIMAT 2.5 MCG/ACT IN AERS: 1.0000 | INHALATION_SPRAY | Freq: Every day | RESPIRATORY_TRACT | 2 refills | Status: DC

## 2019-01-10 MED ORDER — POTASSIUM CHLORIDE CRYS ER 20 MEQ PO TBCR: 40.0000 meq | EXTENDED_RELEASE_TABLET | ORAL | Status: DC

## 2019-01-10 MED ORDER — ACETAMINOPHEN 325 MG PO TABS: 650.0000 mg | ORAL_TABLET | Freq: Four times a day (QID) | ORAL | 2 refills | Status: DC | PRN

## 2019-01-10 MED ORDER — DULOXETINE HCL 30 MG PO CPEP: 90.0000 mg | ORAL_CAPSULE | Freq: Every day | ORAL | 4 refills | Status: DC

## 2019-01-10 MED ORDER — NICOTINE 14 MG/24HR TD PT24: 14.0000 mg | MEDICATED_PATCH | Freq: Every day | TRANSDERMAL | 0 refills | Status: AC

## 2019-01-10 MED ORDER — PANTOPRAZOLE SODIUM 40 MG PO TBEC: 40.0000 mg | DELAYED_RELEASE_TABLET | Freq: Every day | ORAL | 2 refills | Status: DC

## 2019-01-10 MED ORDER — IRON POLYSACCH CMPLX-B12-FA 150-0.025-1 MG PO CAPS: 1.0000 | ORAL_CAPSULE | Freq: Every day | ORAL | 3 refills | Status: DC

## 2019-01-10 MED ORDER — POTASSIUM CHLORIDE CRYS ER 20 MEQ PO TBCR
40.0000 meq | EXTENDED_RELEASE_TABLET | ORAL | Status: AC
  Administered 2019-01-10 (×2): 40 meq via ORAL
  Filled 2019-01-10 (×2): qty 2

## 2019-01-10 MED ORDER — AMLODIPINE BESYLATE 10 MG PO TABS: 10.0000 mg | ORAL_TABLET | Freq: Every day | ORAL | 1 refills | Status: DC

## 2019-01-10 MED ORDER — TRAZODONE HCL 150 MG PO TABS: 150.0000 mg | ORAL_TABLET | Freq: Every day | ORAL | 1 refills | Status: DC

## 2019-01-10 MED ORDER — ATORVASTATIN CALCIUM 40 MG PO TABS: 40.0000 mg | ORAL_TABLET | Freq: Every day | ORAL | 1 refills | Status: DC

## 2019-01-10 NOTE — Progress Notes (Signed)
IV, telemetry removed. D/C instructions reviewed with patient, verbalized understanding. To be transported to private vehicle via wheelchair. Will continue to monitor.

## 2019-01-10 NOTE — Discharge Summary (Addendum)
Jasmine [name], is a 63 y.o. female  DOB 11-05-1955  MRN [id number].  Admission date:  01/05/2019  Admitting Physician  Bethena Roys, MD  Discharge Date:  01/10/2019   Primary MD  Janora Norlander, DO  Recommendations for primary care physician for things to follow:  - 1)Avoid ibuprofen/Advil/Aleve/Motrin/Goody Powders/Naproxen/BC powders/Meloxicam/Diclofenac/Indomethacin and other Nonsteroidal anti-inflammatory medications as these will make you more likely to bleed and can cause stomach ulcers, can also cause Kidney problems.   2) soft diet advised  3) follow-up as outpatient for EUS with Dr. Ardis Hughs or [facility] for further evaluation of idiopathic pancreatitis, biliary and pancreatic duct dilation- in about 6 weeks as outpatient  Admission Diagnosis  Acute pancreatitis without infection or necrosis, unspecified pancreatitis type [K85.90]   Discharge Diagnosis  Acute pancreatitis without infection or necrosis, unspecified pancreatitis type [K85.90]    Active Problems:   Chronic back pain   HTN (hypertension)   Chronic narcotic use   Tobacco abuse   Centrilobular emphysema ([place])   Acute pancreatitis   Pancreatitis, acute      Past Medical History:  Diagnosis Date   Allergy    Anemia    Anxiety    Arthritis    Asthma    Carpal tunnel syndrome    bilateral   Cigarette smoker 08/26/6331   Complication of anesthesia    in the past has had N/V, the last few surgeries she has not been   COPD (chronic obstructive pulmonary disease) ([place])    Easy bruising 07/10/2016   GERD (gastroesophageal reflux disease)    Headache    Heart murmur 2005   never had any problems   History of blood transfusion 2018   History of kidney stones    Hypertension    Hypoglycemia    Hypoglycemia    Iron deficiency anemia 07/11/2016   Neuromuscular disorder ([place])    pt denies    Neuropathy    both arms   Normocytic anemia 07/29/2015   Pancreatitis    Pneumonia    PONV (postoperative nausea and vomiting)    Postoperative anemia due to acute blood loss 11/15/2015   Sciatica    Seizures ([place])    as a child when she would have an asthma attack. none as an adult   Shortness of breath dyspnea     Past Surgical History:  Procedure Laterality Date   ABDOMINAL HYSTERECTOMY  5456   APPLICATION OF ROBOTIC ASSISTANCE FOR SPINAL PROCEDURE N/A 09/05/2016   Procedure: APPLICATION OF ROBOTIC ASSISTANCE FOR SPINAL PROCEDURE;  Surgeon: Consuella Lose, MD;  Location: [facility];  Service: Neurosurgery;  Laterality: N/A;   CERVICAL FUSION     CESAREAN SECTION  1982   COLONOSCOPY     unsure last   cyst removal face     disk repair     neck and lumbar region   FOOT SURGERY Bilateral    to file bones down    HARDWARE REMOVAL N/A 09/05/2016   Procedure: HARDWARE REMOVAL AND REPLACEMENT OF LUMBAR  FIVE SCREWS;  Surgeon: Consuella Lose, MD;  Location: [facility];  Service: Neurosurgery;  Laterality: N/A;   IMPLANTATION / PLACEMENT EPIDURAL NEUROSTIMULATOR ELECTRODES     LAMINECTOMY     2006   LUMBAR FUSION     SPINAL CORD STIMULATOR BATTERY EXCHANGE     SPINAL CORD STIMULATOR INSERTION N/A 07/05/2016   Procedure: REPLACEMENT OF LUMBAR SPINAL CORD STIMULATOR BATTERY;  Surgeon: Newman Pies, MD;  Location: [facility];  Service: Neurosurgery;  Laterality: N/A;   TONSILLECTOMY         HPI  from the history and physical done on the day of admission:    -Chief Complaint: Abdominal pain  HPI: Jasmine [name] is a 63 y.o. female with medical history significant for  COPD and asthma, hypertension.  Patient presented to the ED with complaints of upper abdominal pain of 4 days duration.  She also reports episodes of vomiting-no coffee grounds, no blood, with barely any p.o. intake in the past 4 days.  Last bowel movement was 4 days ago. No fever no chills.  No loose  stools.  She reports once a week intake of alcoholic beverages usually cocktails bourbon mixed with Dr. Malachi Bonds.  ED Course: Tachycardic to 123, blood pressure systolic 465K to 354S, O2 sats greater than 95% on room air.  Lipase elevated 191.  Potassium low 2.4.  Normal magnesium 2.1.  Normal lactic acid 1.2.  UA positive for ketones and proteins with rare bacteria.  Abdominal CT with contrast-findings suggestive of acute pancreatitis with intrahepatic and extrahepatic biliary duct dilatation, pancreatic duct dilatation.  Fatty liver.  Hospitalist admit for acute pancreatitis    Hospital Course:   - Brief Summary 63 year old smoker/COPD, HTN, who denies heavy alcohol use admitted on 01/05/2019 with abdominal pain and found to have acute pancreatitis with dilatation of the intra and extrahepatic biliary ducts as well as pancreatic duct -We were Unable to do MRCP due to implanted neurostimulator -  A/p 1)Acute Pancreatitis with Dilatation of intra and extrahepatic biliary ducts, as well as Pancreatic duct Dilatation--- CT abdomen and pelvis and limited abdominal ultrasound report noted -GI consult appreciated -Patient denies significant EtOH use -Triglycerides are only 88-lipase down to 149 from 181 -WBC down to 4.1 from 16 -CT findings of CBD dilated to 14 mm, intrahepatic biliary ductal dilation, and pancreatic duct dilation measuring up to 8 mm. --Treated with  Protonix, dc home on PP -PTA patient was on high doses of opiates --Abdominal pain has improved significantly- -We were Unable to do MRCP due to implanted neurostimulator -mother with history of cholangiocarcinoma, now deceased -EUS with Dr. Ardis Hughs or [facility] for further evaluation of idiopathic pancreatitis, biliary and pancreatic duct dilation- in about 6 weeks as outpatient  2)FEN-significant electrolyte derangement -- replace and recheck,  3)HTN--stable, continue amlodipine   4)Tobacco Abuse/COPD smoking cessation  advised, okay to give nicotine patch, may use albuterol as needed  5)Chronic pain Syndrome/Depression/Anxiety--- continue Cymbalta 90 mg daily, -has implanted Neurostimulator -Resume home dose hydrocodone   6)Fatty Liver--imaging studies noted, PTA patient was on Lipitor, lipid profile noted---  Code Status : Full code  Family Communication:   (patient is alert, awake and coherent) Discussed with Spouse --- Dr. Darliss [phone]  Disposition Plan  : Home  Consults  :  Gi  Discharge Condition: stable  Follow UP--- Gi as advised  Diet and Activity recommendation:  As advised  Discharge Instructions    Discharge Instructions    Call MD for:  difficulty breathing,  headache or visual disturbances   Complete by: As directed    Call MD for:  extreme fatigue   Complete by: As directed    Call MD for:  persistant dizziness or light-headedness   Complete by: As directed    Call MD for:  persistant nausea and vomiting   Complete by: As directed    Call MD for:  severe uncontrolled pain   Complete by: As directed    Call MD for:  temperature >100.4   Complete by: As directed    Diet - low sodium heart healthy   Complete by: As directed    Discharge instructions   Complete by: As directed    1)Avoid ibuprofen/Advil/Aleve/Motrin/Goody Powders/Naproxen/BC powders/Meloxicam/Diclofenac/Indomethacin and other Nonsteroidal anti-inflammatory medications as these will make you more likely to bleed and can cause stomach ulcers, can also cause Kidney problems.   2) soft diet advised  3) follow-up as outpatient for EUS with Dr. Ardis Hughs or [facility] for further evaluation of idiopathic pancreatitis, biliary and pancreatic duct dilation- in about 6 weeks as outpatient   Increase activity slowly   Complete by: As directed        Discharge Medications     Allergies as of 01/10/2019      Reactions   Lyrica [pregabalin] Other (See Comments)   EXTREME SHAKING/TREMBLING    Darvon [propoxyphene]    UNSPECIFIED REACTION    Gabapentin    Extreme shaking and trembling    Augmentin [amoxicillin-pot Clavulanate] Nausea And Vomiting   Has patient had a PCN reaction causing immediate rash, facial/tongue/throat swelling, SOB or lightheadedness with hypotension:no Has patient had a PCN reaction causing severe rash involving mucus membranes or skin necrosis:No Has patient had a PCN reaction that required hospitalization:No Has patient had a PCN reaction occurring within the last 10 years:Yes--ONLY N/V If all of the above answers are "NO", then may proceed with Cephalosporin use.   Erythromycin Itching, Rash      Vibramycin [doxycycline Calcium] Itching, Rash   "[place] "      Medication List    STOP taking these medications   BC HEADACHE POWDER PO   nicotine 21 mg/24hr patch Commonly known as: NICODERM CQ - dosed in mg/24 hours Replaced by: nicotine 14 mg/24hr patch     TAKE these medications   acetaminophen 325 MG tablet Commonly known as: TYLENOL Take 2 tablets (650 mg total) by mouth every 6 (six) hours as needed for mild pain, fever or headache (or Fever >/= 101).   albuterol 108 (90 Base) MCG/ACT inhaler Commonly known as: ProAir HFA USE 2 INHALATIONS EVERY 6 HOURS AS NEEDED FOR WHEEZING OR SHORTNESS OF BREATH (Needs to be seen before next refill)   amLODipine 10 MG tablet Commonly known as: NORVASC Take 1 tablet (10 mg total) by mouth daily.   atorvastatin 40 MG tablet Commonly known as: Lipitor Take 1 tablet (40 mg total) by mouth daily.   budesonide-formoterol 160-4.5 MCG/ACT inhaler Commonly known as: Symbicort USE 2 INHALATIONS TWICE A DAY   calcium carbonate 1500 (600 Ca) MG Tabs tablet Commonly known as: OSCAL Take 600 mg by mouth daily.   cetirizine 10 MG tablet Commonly known as: ZYRTEC Take 10 mg by mouth daily as needed for allergies. Reported on 03/12/2015   D-3-5 125 MCG (5000 UT) capsule Generic drug:  Cholecalciferol Take 5,000 Units daily by mouth.   diclofenac sodium 1 % Gel Commonly known as: VOLTAREN Apply 1 application topically daily as needed for muscle pain.  DULoxetine 30 MG capsule Commonly known as: CYMBALTA Take 3 capsules (90 mg total) by mouth daily. What changed: See the new instructions.   feeding supplement (ENSURE ENLIVE) Liqd Take 237 mLs by mouth 2 (two) times daily between meals.   Fish Oil 1000 MG Caps Take 1 capsule by mouth daily.   guaiFENesin 600 MG 12 hr tablet Commonly known as: MUCINEX Take 600 mg by mouth 2 (two) times daily.   HYDROcodone-acetaminophen 7.5-325 MG tablet Commonly known as: NORCO Take 1 tablet by mouth 4 (four) times daily.   Hysingla ER 60 MG T24a Generic drug: HYDROcodone Bitartrate ER Take 1 tablet by mouth daily.   ipratropium-albuterol 0.5-2.5 (3) MG/3ML Soln Commonly known as: DUONEB Inhale 3 mLs into the lungs 4 (four) times daily as needed (for shortness of breath/wheezing). *May use as needed   Iron Polysacch Cmplx-B12-FA 150-0.025-1 MG Caps Take 1 capsule by mouth daily.   magic mouthwash w/lidocaine Soln Take 5 mLs by mouth 3 (three) times daily.   Metanx 3-90.314-2-35 MG Caps Take 1 tablet by mouth 2 (two) times daily.   nicotine 14 mg/24hr patch Commonly known as: NICODERM CQ - dosed in mg/24 hours Place 1 patch (14 mg total) onto the skin daily. Replaces: nicotine 21 mg/24hr patch   OXYGEN 2lpm with sleep and as needed during the day   pantoprazole 40 MG tablet Commonly known as: PROTONIX Take 1 tablet (40 mg total) by mouth daily.   Spiriva Respimat 2.5 MCG/ACT Aers Generic drug: Tiotropium Bromide Monohydrate Inhale 1 puff into the lungs daily. What changed: See the new instructions.   tizanidine 6 MG capsule Commonly known as: ZANAFLEX Take 1 capsule 3 (three) times daily by mouth.   traZODone 150 MG tablet Commonly known as: DESYREL Take 1 tablet (150 mg total) by mouth at  bedtime. What changed: how much to take      Major procedures and Radiology Reports - PLEASE review detailed and final reports for all details, in brief -   Ct Abdomen Pelvis W Contrast  Result Date: 01/05/2019 CLINICAL DATA:  Abdominal pain.  Pancreatitis suspected. EXAM: CT ABDOMEN AND PELVIS WITH CONTRAST TECHNIQUE: Multidetector CT imaging of the abdomen and pelvis was performed using the standard protocol following bolus administration of intravenous contrast. CONTRAST:  18mL OMNIPAQUE IOHEXOL 300 MG/ML  SOLN COMPARISON:  04/09/2017 FINDINGS: Lower chest: Lung bases are clear. No effusions. Heart is normal size. Hepatobiliary: Severe diffuse fatty infiltration of the liver. Gallbladder is distended. Common bile duct dilated measuring up to 14 mm. Intrahepatic biliary ductal dilatation. No visible ductal stones. Pancreas: There is pancreatic ductal dilatation measuring up to 8 mm in the pancreatic head. There is inflammation around the pancreas, best seen around the pancreatic head and uncinate process most compatible with acute pancreatitis. Spleen: No focal abnormality.  Normal size. Adrenals/Urinary Tract: Small scattered cysts in the kidneys bilaterally. No hydronephrosis. Urinary bladder unremarkable. Stomach/Bowel: Large stool burden throughout the colon. No evidence of bowel obstruction. Vascular/Lymphatic: Aortic atherosclerosis. No enlarged abdominal or pelvic lymph nodes. Reproductive: Prior hysterectomy.  No adnexal masses. Other: No free fluid or free air. Musculoskeletal: No acute bony abnormality. IMPRESSION: Stranding noted around the pancreas, most pronounced around the pancreatic head and uncinate process. Findings most compatible with acute pancreatitis. Dilated intrahepatic and extrahepatic biliary ducts as well as the pancreatic duct. Severe diffuse fatty infiltration of the liver. Small bilateral renal cysts. Aortic atherosclerosis. Electronically Signed   By: Rolm Baptise M.D.    On: 01/05/2019  19:51   US Abdomen Limited Ruq  Result Date: 01/06/2019 CLINICAL DATA:  Abdominal pain for 5 days. Patient admitted with pancreatitis. EXAM: ULTRASOUND ABDOMEN LIMITED RIGHT UPPER QUADRANT COMPARISON:  CT abdomen and pelvis 01/05/2019. FINDINGS: Gallbladder: The gallbladder is distended. No gallstones or wall thickening visualized. No sonographic Murphy sign noted by sonographer. Common bile duct: Diameter: 17 mm. Liver: Echogenicity is diffusely increased. No focal lesion. Intrahepatic biliary ductal dilatation is best seen in the left hepatic lobe as on prior CT. Portal vein is patent on color Doppler imaging with normal direction of blood flow towards the liver. Other: None. IMPRESSION: Negative for gallstones. No finding to explain the patient's intra and extrahepatic biliary ductal dilatation. Fatty infiltration of the liver. Please pick the correct US ABDOMEN LIMITED template. Electronically Signed   By: Inge Rise M.D.   On: 01/06/2019 11:28    Micro Results   Recent Results (from the past 240 hour(s))  SARS CORONAVIRUS 2 (TAT 6-24 HRS) Nasopharyngeal Nasopharyngeal Swab     Status: None   Collection Time: 01/05/19  7:43 PM   Specimen: Nasopharyngeal Swab  Result Value Ref Range Status   SARS Coronavirus 2 NEGATIVE NEGATIVE Final    Comment: (NOTE) SARS-CoV-2 target nucleic acids are NOT DETECTED. The SARS-CoV-2 RNA is generally detectable in upper and lower respiratory specimens during the acute phase of infection. Negative results do not preclude SARS-CoV-2 infection, do not rule out co-infections with other pathogens, and should not be used as the sole basis for treatment or other patient management decisions. Negative results must be combined with clinical observations, patient history, and epidemiological information. The expected result is Negative. Fact Sheet for Patients: SugarRoll.be Fact Sheet for Healthcare  Providers: https://www.woods-mathews.com/ This test is not yet approved or cleared by the [place] FDA and  has been authorized for detection and/or diagnosis of SARS-CoV-2 by FDA under an Emergency Use Authorization (EUA). This EUA will remain  in effect (meaning this test can be used) for the duration of the COVID-19 declaration under Section 56 4(b)(1) of the Act, 21 U.S.C. section 360bbb-3(b)(1), unless the authorization is terminated or revoked sooner. Performed at [facility], [place] [address] [place], [place] [address]        Today   Subjective    Jasmine [name] today has no new complaints -Tolerating oral intake well  No Nausea, Vomiting or Diarrhea No fever  Or chills           Patient has been seen and examined prior to discharge   Objective   Blood pressure 131/80, pulse 77, temperature 98.2 F (36.8 C), resp. rate 20, height 5\' 3"  (1.6 m), weight 42.2 kg, SpO2 100 %.   Intake/Output Summary (Last 24 hours) at 01/10/2019 1523 Last data filed at 01/10/2019 0900 Gross per 24 hour  Intake 1224.67 ml  Output 6550 ml  Net -5325.33 ml   Exam Gen:- Awake Alert, no acute distress  HEENT:- [place].AT, No sclera icterus Neck-Supple Neck,No JVD,.  Lungs-  CTAB , good air movement bilaterally  CV- S1, S2 normal, regular Abd-  +ve B.Sounds, Abd Soft, mostly resolved abdominal tenderness,   Extremity/Skin:- No  edema,   good pulses Psych-affect is appropriate, oriented x3 Neuro-no new focal deficits, no tremors    Data Review   CBC w Diff:  Lab Results  Component Value Date   WBC 4.1 01/10/2019   HGB 12.1 01/10/2019   HGB 12.4 07/26/2017   HCT 39.4 01/10/2019   HCT  37.8 07/26/2017   PLT 228 01/10/2019   PLT 351 07/26/2017   LYMPHOPCT 5 01/05/2019   MONOPCT 10 01/05/2019   EOSPCT 0 01/05/2019   BASOPCT 0 01/05/2019    CMP:  Lab Results  Component Value Date   NA 141 01/10/2019   NA 142 10/29/2017   K 2.7 (LL) 01/10/2019   CL 101  01/10/2019   CO2 30 01/10/2019   BUN <5 (L) 01/10/2019   BUN 11 10/29/2017   CREATININE <0.30 (L) 01/10/2019   PROT 5.4 (L) 01/09/2019   PROT 6.6 10/29/2017   ALBUMIN 2.6 (L) 01/09/2019   ALBUMIN 4.2 10/29/2017   BILITOT 0.5 01/09/2019   BILITOT 0.3 10/29/2017   ALKPHOS 91 01/09/2019   AST 24 01/09/2019   ALT 21 01/09/2019  .   Total Discharge time is about 33 minutes  Roxan Hockey M.D on 01/10/2019 at 3:23 PM  Go to [email or web address] -  for contact info  [facility] - Office  [phone]

## 2019-01-10 NOTE — Discharge Instructions (Signed)
1)Avoid ibuprofen/Advil/Aleve/Motrin/Goody Powders/Naproxen/BC powders/Meloxicam/Diclofenac/Indomethacin and other Nonsteroidal anti-inflammatory medications as these will make you more likely to bleed and can cause stomach ulcers, can also cause Kidney problems.   2) soft diet advised  3) follow-up as outpatient for EUS with Dr. Ardis Hughs or Associates for further evaluation of idiopathic pancreatitis, biliary and pancreatic duct dilation- in about 6 weeks as outpatient

## 2019-01-10 NOTE — Care Management Important Message (Signed)
Important Message  Patient Details  Name: Jasmine [name] MRN: [id number] Date of Birth: 01-Jul-1955   Medicare Important Message Given:  Yes     [name] 01/10/2019, 1:12 PM

## 2019-01-10 NOTE — Progress Notes (Signed)
CRITICAL VALUE ALERT  Critical Value:  Potassium 2.7  Date & Time Notied: 01/10/2019 3361  Provider Notified: Dr. Denton Brick  Orders Received/Actions taken: awaiting further orders

## 2019-01-14 ENCOUNTER — Telehealth: Payer: Self-pay

## 2019-01-14 NOTE — Telephone Encounter (Signed)
Jasmine Banister, MD  Timothy Lasso, RN; Mansouraty, Telford Nab., MD        She needs upper EUS in about 6 weeks for recent mild acute pancreatitis, dilated biliary and pancreatic ducts, normal LFTs. Either myself or Dr. Rush Landmark. Thanks

## 2019-01-15 ENCOUNTER — Other Ambulatory Visit: Payer: Self-pay

## 2019-01-15 DIAGNOSIS — K8500 Idiopathic acute pancreatitis without necrosis or infection: Secondary | ICD-10-CM

## 2019-01-15 NOTE — Telephone Encounter (Signed)
EUS scheduled for 02/20/19 at 1030 am [facility] with Dr Ardis Hughs , pt instructed and medications reviewed.  Patient instructions mailed to home.  Patient to call with any questions or concerns. Covid testing also verbalized understanding for 02/17/19 at 950 am.

## 2019-01-24 ENCOUNTER — Telehealth: Payer: Self-pay | Admitting: Gastroenterology

## 2019-01-24 NOTE — Telephone Encounter (Signed)
Pt needs to reschedule her procedure at the hospital

## 2019-01-27 NOTE — Telephone Encounter (Signed)
The pt has an appt for COVID on 12/14 and EUS on 12/17.  Left message on machine to call back to confirm she needs to reschedule the existing appt?

## 2019-01-27 NOTE — Telephone Encounter (Signed)
I spoke with the pt and her husband and they have decided to leave the appt as scheduled. They will call back if they want to change it in the future.

## 2019-02-06 ENCOUNTER — Telehealth: Payer: Self-pay | Admitting: Gastroenterology

## 2019-02-06 NOTE — Telephone Encounter (Signed)
Appt cancelled per pt request.

## 2019-02-17 ENCOUNTER — Other Ambulatory Visit (HOSPITAL_COMMUNITY): Payer: TRICARE For Life (TFL)

## 2019-02-20 ENCOUNTER — Ambulatory Visit (HOSPITAL_COMMUNITY): Admit: 2019-02-20 | Payer: TRICARE For Life (TFL) | Admitting: Gastroenterology

## 2019-02-20 ENCOUNTER — Encounter (HOSPITAL_COMMUNITY): Payer: Self-pay

## 2019-02-20 SURGERY — UPPER ENDOSCOPIC ULTRASOUND (EUS) RADIAL
Anesthesia: Monitor Anesthesia Care

## 2019-05-06 ENCOUNTER — Ambulatory Visit: Payer: TRICARE For Life (TFL) | Admitting: Internal Medicine

## 2019-07-28 ENCOUNTER — Other Ambulatory Visit (HOSPITAL_COMMUNITY): Payer: Self-pay | Admitting: *Deleted

## 2019-07-28 DIAGNOSIS — E876 Hypokalemia: Secondary | ICD-10-CM

## 2019-07-28 DIAGNOSIS — D508 Other iron deficiency anemias: Secondary | ICD-10-CM

## 2019-07-29 ENCOUNTER — Inpatient Hospital Stay (HOSPITAL_COMMUNITY): Payer: Medicare Other | Attending: Hematology

## 2019-07-29 ENCOUNTER — Other Ambulatory Visit: Payer: Self-pay

## 2019-07-29 DIAGNOSIS — E876 Hypokalemia: Secondary | ICD-10-CM

## 2019-07-29 DIAGNOSIS — D508 Other iron deficiency anemias: Secondary | ICD-10-CM | POA: Diagnosis present

## 2019-07-29 LAB — COMPREHENSIVE METABOLIC PANEL
ALT: 31 U/L (ref 0–44)
AST: 35 U/L (ref 15–41)
Albumin: 3.9 g/dL (ref 3.5–5.0)
Alkaline Phosphatase: 73 U/L (ref 38–126)
Anion gap: 14 (ref 5–15)
BUN: 19 mg/dL (ref 8–23)
CO2: 24 mmol/L (ref 22–32)
Calcium: 9.4 mg/dL (ref 8.9–10.3)
Chloride: 101 mmol/L (ref 98–111)
Creatinine, Ser: 0.49 mg/dL (ref 0.44–1.00)
GFR calc Af Amer: >60 mL/min (ref >60)
GFR calc non Af Amer: >60 mL/min (ref >60)
Glucose, Bld: 107 mg/dL — ABNORMAL HIGH (ref 70–99)
Potassium: 3.4 mmol/L — ABNORMAL LOW (ref 3.5–5.1)
Sodium: 139 mmol/L (ref 135–145)
Total Bilirubin: 0.5 mg/dL (ref 0.3–1.2)
Total Protein: 6.8 g/dL (ref 6.5–8.1)

## 2019-07-29 LAB — CBC WITH DIFFERENTIAL/PLATELET
Abs Immature Granulocytes: 0.05 10*3/uL (ref 0.00–0.07)
Basophils Absolute: 0.1 10*3/uL (ref 0.0–0.1)
Basophils Relative: 1 %
Eosinophils Absolute: 0.1 10*3/uL (ref 0.0–0.5)
Eosinophils Relative: 2 %
HCT: 38.6 % (ref 36.0–46.0)
Hemoglobin: 11.9 g/dL — ABNORMAL LOW (ref 12.0–15.0)
Immature Granulocytes: 1 %
Lymphocytes Relative: 24 %
Lymphs Abs: 1.7 10*3/uL (ref 0.7–4.0)
MCH: 33.6 pg (ref 26.0–34.0)
MCHC: 30.8 g/dL (ref 30.0–36.0)
MCV: 109 fL — ABNORMAL HIGH (ref 80.0–100.0)
Monocytes Absolute: 0.7 10*3/uL (ref 0.1–1.0)
Monocytes Relative: 10 %
Neutro Abs: 4.4 10*3/uL (ref 1.7–7.7)
Neutrophils Relative %: 62 %
Platelets: 206 10*3/uL (ref 150–400)
RBC: 3.54 MIL/uL — ABNORMAL LOW (ref 3.87–5.11)
RDW: 12.9 % (ref 11.5–15.5)
WBC: 7.1 10*3/uL (ref 4.0–10.5)
nRBC: 0 % (ref 0.0–0.2)

## 2019-07-29 LAB — LACTATE DEHYDROGENASE: LDH: 150 U/L (ref 98–192)

## 2019-07-29 LAB — FERRITIN: Ferritin: 50 ng/mL (ref 11–307)

## 2019-08-05 ENCOUNTER — Ambulatory Visit (INDEPENDENT_AMBULATORY_CARE_PROVIDER_SITE_OTHER): Payer: Medicare Other | Admitting: Internal Medicine

## 2019-08-05 ENCOUNTER — Inpatient Hospital Stay (HOSPITAL_COMMUNITY): Payer: Medicare Other | Attending: Nurse Practitioner | Admitting: Nurse Practitioner

## 2019-08-05 ENCOUNTER — Other Ambulatory Visit: Payer: Self-pay

## 2019-08-05 ENCOUNTER — Encounter: Payer: Self-pay | Admitting: Internal Medicine

## 2019-08-05 DIAGNOSIS — F17210 Nicotine dependence, cigarettes, uncomplicated: Secondary | ICD-10-CM

## 2019-08-05 DIAGNOSIS — D508 Other iron deficiency anemias: Secondary | ICD-10-CM | POA: Diagnosis not present

## 2019-08-05 DIAGNOSIS — J449 Chronic obstructive pulmonary disease, unspecified: Secondary | ICD-10-CM

## 2019-08-05 DIAGNOSIS — J9611 Chronic respiratory failure with hypoxia: Secondary | ICD-10-CM

## 2019-08-05 MED ORDER — IRON POLYSACCH CMPLX-B12-FA 150-0.025-1 MG PO CAPS: 1.0000 | ORAL_CAPSULE | Freq: Every day | ORAL | 4 refills | Status: DC

## 2019-08-05 NOTE — Progress Notes (Signed)
Subjective:     Patient ID: Jasmine [name], female   DOB: 10-Apr-1955    MRN: [id number]    Brief patient profile: 64yowf active smoker/MM  dx with asthma as child but by HS outgrew it until her 49's with flares but better between until early 2000s and maintain on multliple inhalers and then 02/2016 hosp in [place] then [facility] April 2019 and newly started on 02 = 2lpm as needed daytime and hs and referred to pulmonary clinic 08/24/2017 by Dr   Danice Goltz with GOLD II spirometry documented 09/21/17     Admit date: 06/30/2017 Discharge date: 07/09/2017    Recommendations for Outpatient Follow-up:  1. pls refer to sleep medicine for osa testing/epworht/stopbang 2. pls continue cessation counseling for Tob 3. Needs cxr 1 mo 4. Consider Megace/chantix? 5. Recheck LFT and Magnesium in 1-2 weeks---has elevated LFT unknown sig [?CVC liver]  Discharge Diagnoses:  Principal Problem:   Acute respiratory failure with hypoxia (HCC) Active Problems:   Chronic back pain   HTN (hypertension)   Chronic narcotic use   Hypokalemia   COPD with acute exacerbation (HCC)   Malnutrition of moderate degree   Discharge Condition: please refert  Diet recommendation: eat anything to gain weight for now       [facility] Weights   06/30/17 2122 07/01/17 0500 07/02/17 0412  Weight: 47.2 kg (104 lb 0.9 oz) 47.2 kg (104 lb 0.9 oz) 48.1 kg (106 lb 0.7 oz)    History of present illness:  Please see note from Dr. Paulene Floor "64 y.o.femalewith a history of COPD, GERD, neuropathy, chronic pain on chronic narcotics, hypertension. Patient presents with worsening shortness of breathover the past 3 to 4 weeks. She presented to ED with acute respiratory distress with hypoxia and subsequently started on bipap"    Hospital Course:  1. Acute respiratory failure with hypoxia-patient says that she responded very well to nightly BiPAP. She was able to sleep for the first time in a very long time.  ?OSA-referrinf to Sleep as OP.  Needs screening fro pulm htn as well? Could benefit from Cardio-pulm rehab if avail  [place] this [place] 2. COPD with acute exacerbation- continue current treatments with IV steroids, antibiotics and continuous around-the-clock neb treatments-narrowed and tapered on d/c home--5 day steroid taper 3. Essential Hypertension-stable. 4. Hypokalemia-replacement ordered. recheck mag as op 5. Chronic pain with opioid dependence-resume home pain med management regimen which has been stable per patient.        08/24/2017 1st [facility] office visit/ Tahmid Stonehocker   Chief Complaint  Patient presents with  . Pulmonary Consult    Referred by Dr. Adam Phenix.  Pt states dxed with COPD approx 10 years ago. She states she was hospitalized in April 2019 for COPD flare and sent home with o2.  She states she gets SOB with minimal exertion such as walking from room to room at home. She uses proair 3 x per wk on average and has duoneb but has not needed it since starting o2.    MMRC3 = can't walk 100 yards even at a slow pace at a flat grade s stopping due to sob  Even on 02  Rattling cough more prod first thing in am = min mucoid  maint on symbicort 160    Off prednisone x sev weeks and about the same since stopped just on symbicort 160 2bid  And   2lpm with activity  Sleeping  2lpm and 30 degrees    rec Plan A = Automatic =  Symbicort 160  Take 2 puffs first thing in am and then another 2 puffs about 12 hours later and spiriva 2 pffs each am  Work on inhaler technique:     Plan B = Backup Only use your albuterol (Proair)as a rescue medication Plan C = Crisis - only use your albuterol nebulizer if you first try Plan B      08/05/2019  f/u ov/Janiqua Friscia re:  GOLD II/ symb /spiriva still smoking  - had Moderna vaccine  Chief Complaint  Patient presents with  . Follow-up    Breathing has been worse over the past 2 months- she is waking up in the morning feeling SOB and has to use  her rescue inhaler. She is using the rescue inhaler most days.   Dyspnea: foot slows her down> sob  Cough: minimal in am / white  Sleeping: on 02 flat bed  SABA use: not using symb first thing as rec and uses saba first  02: 2lpm hs only / wants portable system   No obvious day to day or daytime variability or assoc excess/ purulent sputum or mucus plugs or hemoptysis or cp or chest tightness, subjective wheeze or overt sinus or hb symptoms.   Sleeping as above without nocturna  exacerbation  of respiratory  c/o's or need for noct saba. Also denies any obvious fluctuation of symptoms with weather or environmental changes or other aggravating or alleviating factors except as outlined above   No unusual exposure hx or h/o childhood pna  or knowledge of premature birth.  Current Allergies, Complete Past Medical History, Past Surgical History, Family History, and Social History were reviewed in [employer] record.  ROS  The following are not active complaints unless bolded Hoarseness, sore throat, dysphagia, dental problems, itching, sneezing,  nasal congestion or discharge of excess mucus or purulent secretions, ear ache,   fever, chills, sweats, unintended wt loss or wt gain, classically pleuritic or exertional cp,  orthopnea pnd or arm/hand swelling  or leg swelling, presyncope, palpitations, abdominal pain, anorexia, nausea, vomiting, diarrhea  or change in bowel habits or change in bladder habits, change in stools or change in urine, dysuria, hematuria,  rash, arthralgias, visual complaints, headache, numbness, weakness or ataxia or problems with walking or coordination,  change in mood or  memory.        Current Meds  Medication Sig  . acetaminophen (TYLENOL) 325 MG tablet Take 2 tablets (650 mg total) by mouth every 6 (six) hours as needed for mild pain, fever or headache (or Fever >/= 101).  Marland Kitchen albuterol (PROAIR HFA) 108 (90 Base) MCG/ACT inhaler USE 2 INHALATIONS  EVERY 6 HOURS AS NEEDED FOR WHEEZING OR SHORTNESS OF BREATH (Needs to be seen before next refill)  . amLODipine (NORVASC) 10 MG tablet Take 1 tablet (10 mg total) by mouth daily.  Marland Kitchen atorvastatin (LIPITOR) 40 MG tablet Take 1 tablet (40 mg total) by mouth daily.  . budesonide-formoterol (SYMBICORT) 160-4.5 MCG/ACT inhaler USE 2 INHALATIONS TWICE A DAY  . calcium carbonate (OSCAL) 1500 (600 Ca) MG TABS tablet Take 600 mg by mouth daily.   . cetirizine (ZYRTEC) 10 MG tablet Take 10 mg by mouth daily as needed for allergies. Reported on 03/12/2015  . Cholecalciferol (D-3-5) 5000 units capsule Take 5,000 Units daily by mouth.  . diclofenac sodium (VOLTAREN) 1 % GEL Apply 1 application topically daily as needed for muscle pain.  . DULoxetine (CYMBALTA) 30 MG capsule Take 3 capsules (90 mg total) by  mouth daily.  . feeding supplement, ENSURE ENLIVE, (ENSURE ENLIVE) LIQD Take 237 mLs by mouth 2 (two) times daily between meals.  Marland Kitchen guaiFENesin (MUCINEX) 600 MG 12 hr tablet Take 600 mg by mouth 2 (two) times daily.   Marland Kitchen HYDROcodone Bitartrate ER (HYSINGLA ER) 60 MG T24A Take 1 tablet by mouth daily.  Marland Kitchen HYDROcodone-acetaminophen (NORCO) 7.5-325 MG tablet Take 1 tablet by mouth 4 (four) times daily.  Marland Kitchen ipratropium-albuterol (DUONEB) 0.5-2.5 (3) MG/3ML SOLN Inhale 3 mLs into the lungs 4 (four) times daily as needed (for shortness of breath/wheezing). *May use as needed  . Iron Polysacch Cmplx-B12-FA 150-0.025-1 MG CAPS Take 1 capsule by mouth daily.  Marland Kitchen L-Methylfolate-Algae-B12-B6 (METANX) 3-90.314-2-35 MG CAPS Take 1 tablet by mouth 2 (two) times daily.  . magic mouthwash w/lidocaine SOLN Take 5 mLs by mouth 3 (three) times daily.  . Omega-3 Fatty Acids (FISH OIL) 1000 MG CAPS Take 1 capsule by mouth daily.   . OXYGEN 2lpm with sleep and as needed during the day  . pantoprazole (PROTONIX) 40 MG tablet Take 1 tablet (40 mg total) by mouth daily.  . Tiotropium Bromide Monohydrate (SPIRIVA RESPIMAT) 2.5 MCG/ACT AERS  Inhale 1 puff into the lungs daily. (Patient taking differently: Inhale 2 puffs into the lungs daily. )  . tizanidine (ZANAFLEX) 6 MG capsule Take 1 capsule 3 (three) times daily by mouth.  . traZODone (DESYREL) 150 MG tablet Take 1 tablet (150 mg total) by mouth at bedtime. (Patient taking differently: 1 1/2 tablet at bedtime)             Objective:   Physical Exam  amb wf walks with rollator looks much older than reported age   08/05/2019           98.6  01/09/2018        105  09/21/2017        104   08/24/17 105 lb 9.6 oz (47.9 kg)  07/31/17 106 lb (48.1 kg)  07/26/17 105 lb 3.2 oz (47.7 kg)    Vital signs reviewed  08/05/2019  - Note at rest 02 sats  92% on RA     HEENT : pt wearing mask not removed for exam due to covid -19 concerns.    NECK :  without JVD/Nodes/TM/ nl carotid upstrokes bilaterally   LUNGS: no acc muscle use,  Mod barrel  contour chest wall with bilateral  Distant bs s audible wheeze and  without cough on insp or exp maneuvers and mod  Hyperresonant  to  percussion bilaterally     CV:  RRR  no s3 or murmur or increase in P2, and no edema   ABD:  soft and nontender with pos mid insp Hoover's  in the supine position. No bruits or organomegaly appreciated, bowel sounds nl  MS:     ext warm without deformities, calf tenderness, cyanosis or clubbing No obvious joint restrictions x wears brace R foot   SKIN: warm and dry without lesions    NEURO:  alert, approp, nl sensorium with  R foot drop                         Assessment:

## 2019-08-05 NOTE — Progress Notes (Signed)
[facility] Follow up:    Jasmine Norlander, DO [place] [address]   DIAGNOSIS: Iron deficiency anemia  CURRENT THERAPY: Oral iron therapy  INTERVAL HISTORY: Jasmine [name] 64 y.o. female was called for iron deficiency anemia.  Patient reports she ran out of her oral iron and has been feeling more fatigued lately.  She denies any bright red bleeding per rectum or melena.  She denies any easy bruising or bleeding. Denies any nausea, vomiting, or diarrhea. Denies any new pains. Had not noticed any recent bleeding such as epistaxis, hematuria or hematochezia. Denies recent chest pain on exertion, shortness of breath on minimal exertion, pre-syncopal episodes, or palpitations. Denies any numbness or tingling in hands or feet. Denies any recent fevers, infections, or recent hospitalizations. Patient reports appetite at 100% and energy level at 75%.  She is eating well and maintain her weight at this time.    Patient Active Problem List   Diagnosis Date Noted  . Pancreatitis, acute 01/06/2019  . Acute pancreatitis 01/05/2019  . Cigarette smoker 08/25/2017  . Chronic respiratory failure with hypoxia ([place]) 08/25/2017  . Centrilobular emphysema ([place]) 07/09/2017  . Aortic atherosclerosis ([place]) 07/09/2017  . Hepatic steatosis 07/09/2017  . Malnutrition of moderate degree 07/02/2017  . Acute respiratory failure with hypoxia ([place]) 06/30/2017  . COPD with acute exacerbation ([place]) 06/30/2017  . 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/2016  . Tobacco abuse 08/04/2016  . Iron deficiency anemia 07/11/2016  . Easy bruising 07/10/2016  . Lumbar stenosis 11/11/2015  . Leg pain 09/24/2015  . Vitamin D deficiency 07/29/2015  . Chronic back pain 02/22/2015  . HTN (hypertension) 02/22/2015  . Chronic narcotic use 02/22/2015  . COPD GOLD II  02/22/2015  . Headache 02/22/2015    is allergic to lyrica [pregabalin]; darvon [propoxyphene]; gabapentin; augmentin  [amoxicillin-pot clavulanate]; erythromycin; and vibramycin [doxycycline calcium].  MEDICAL HISTORY: Past Medical History:  Diagnosis Date  . Allergy   . Anemia   . Anxiety   . Arthritis   . Asthma   . Carpal tunnel syndrome    bilateral  . Cigarette smoker 08/25/2017  . Complication of anesthesia    in the past has had N/V, the last few surgeries she has not been  . COPD (chronic obstructive pulmonary disease) ([place])   . Easy bruising 07/10/2016  . GERD (gastroesophageal reflux disease)   . Headache   . Heart murmur 2005   never had any problems  . History of blood transfusion 2018  . History of kidney stones   . Hypertension   . Hypoglycemia   . Hypoglycemia   . Iron deficiency anemia 07/11/2016  . Neuromuscular disorder ([place])    pt denies  . Neuropathy    both arms  . Normocytic anemia 07/29/2015  . Pancreatitis   . Pneumonia   . PONV (postoperative nausea and vomiting)   . Postoperative anemia due to acute blood loss 11/15/2015  . Sciatica   . Seizures ([place])    as a child when she would have an asthma attack. none as an adult  . Shortness of breath dyspnea     SURGICAL HISTORY: Past Surgical History:  Procedure Laterality Date  . ABDOMINAL HYSTERECTOMY  2005  . APPLICATION OF ROBOTIC ASSISTANCE FOR SPINAL PROCEDURE N/A 09/05/2016   Procedure: APPLICATION OF ROBOTIC ASSISTANCE FOR SPINAL PROCEDURE;  Surgeon: Consuella Lose, MD;  Location: [facility];  Service: Neurosurgery;  Laterality: N/A;  . CERVICAL FUSION    .  [place]  . COLONOSCOPY     unsure last  . cyst removal face    . disk repair     neck and lumbar region  . FOOT SURGERY Bilateral    to file bones down   . HARDWARE REMOVAL N/A 09/05/2016   Procedure: HARDWARE REMOVAL AND REPLACEMENT OF LUMBAR FIVE SCREWS;  Surgeon: Consuella Lose, MD;  Location: [facility];  Service: Neurosurgery;  Laterality: N/A;  . IMPLANTATION / PLACEMENT EPIDURAL NEUROSTIMULATOR ELECTRODES    . LAMINECTOMY     2006  .  LUMBAR FUSION    . SPINAL CORD STIMULATOR BATTERY EXCHANGE    . SPINAL CORD STIMULATOR INSERTION N/A 07/05/2016   Procedure: REPLACEMENT OF LUMBAR SPINAL CORD STIMULATOR BATTERY;  Surgeon: Newman Pies, MD;  Location: [facility];  Service: Neurosurgery;  Laterality: N/A;  . TONSILLECTOMY      SOCIAL HISTORY: Social History   Socioeconomic History  . Marital status: Married    Spouse name: Not on file  . Number of children: Not on file  . Years of education: Not on file  . Highest education level: Not on file  Occupational History  . Not on file  Tobacco Use  . Smoking status: Current Every Day Smoker    Packs/day: 2.00    Years: 35.00    Pack years: 70.00    Types: Cigarettes  . Smokeless tobacco: Never Used  . Tobacco comment: trying to quit  Substance and Sexual Activity  . Alcohol use: Yes    Alcohol/week: 2.0 standard drinks    Types: 2 Glasses of wine per week    Comment: occasionally  . Drug use: No  . Sexual activity: Not on file  Other Topics Concern  . Not on file  Social History Narrative  . Not on file   Social Determinants of Health   Financial Resource Strain:   . Difficulty of Paying Living Expenses:   Food Insecurity:   . Worried About Charity fundraiser in the Last Year:   . Arboriculturist in the Last Year:   Transportation Needs:   . Film/video editor (Medical):   Marland Kitchen Lack of Transportation (Non-Medical):   Physical Activity:   . Days of Exercise per Week:   . Minutes of Exercise per Session:   Stress:   . Feeling of Stress :   Social Connections:   . Frequency of Communication with Friends and Family:   . Frequency of Social Gatherings with Friends and Family:   . Attends Religious Services:   . Active Member of Clubs or Organizations:   . Attends Archivist Meetings:   Marland Kitchen Marital Status:   Intimate Partner Violence:   . Fear of Current or Ex-Partner:   . Emotionally Abused:   Marland Kitchen Physically Abused:   . Sexually Abused:      FAMILY HISTORY: Family History  Problem Relation Age of Onset  . Heart disease Mother   . Diabetes Mother   . Hypertension Mother   . Hyperlipidemia Mother   . COPD Mother        smoked  . Cancer Mother        bile duct  . COPD Father   . Diabetes Father   . Heart disease Father   . Cancer Father        throat  . Dementia Father   . Asthma Son   . Hypertension Sister   . Hyperlipidemia Sister   . Hypertension  Brother   . Hyperlipidemia Brother   . Hypertension Brother   . Hyperlipidemia Brother   . Emphysema Maternal Grandmother        smoked  . Colon cancer Neg Hx   . Colon polyps Neg Hx     Review of Systems  Constitutional: Positive for fatigue.  All other systems reviewed and are negative.   Vital signs: -Deferred due to telephone visit  Physical Exam -Deferred due to telephone visit -Patient was alert and oriented over the phone and in no acute distress   LABORATORY DATA:  CBC    Component Value Date/Time   WBC 7.1 07/29/2019 1410   RBC 3.54 (L) 07/29/2019 1410   HGB 11.9 (L) 07/29/2019 1410   HGB 12.4 07/26/2017 1636   HCT 38.6 07/29/2019 1410   HCT 37.8 07/26/2017 1636   PLT 206 07/29/2019 1410   PLT 351 07/26/2017 1636   MCV 109.0 (H) 07/29/2019 1410   MCV 100 (H) 07/26/2017 1636   MCH 33.6 07/29/2019 1410   MCHC 30.8 07/29/2019 1410   RDW 12.9 07/29/2019 1410   RDW 15.3 07/26/2017 1636   LYMPHSABS 1.7 07/29/2019 1410   LYMPHSABS 1.8 07/26/2017 1636   MONOABS 0.7 07/29/2019 1410   EOSABS 0.1 07/29/2019 1410   EOSABS 0.1 07/26/2017 1636   BASOSABS 0.1 07/29/2019 1410   BASOSABS 0.0 07/26/2017 1636    CMP     Component Value Date/Time   NA 139 07/29/2019 1410   NA 142 10/29/2017 1342   K 3.4 (L) 07/29/2019 1410   CL 101 07/29/2019 1410   CO2 24 07/29/2019 1410   GLUCOSE 107 (H) 07/29/2019 1410   BUN 19 07/29/2019 1410   BUN 11 10/29/2017 1342   CREATININE 0.49 07/29/2019 1410   CALCIUM 9.4 07/29/2019 1410   PROT 6.8  07/29/2019 1410   PROT 6.6 10/29/2017 1342   ALBUMIN 3.9 07/29/2019 1410   ALBUMIN 4.2 10/29/2017 1342   AST 35 07/29/2019 1410   ALT 31 07/29/2019 1410   ALKPHOS 73 07/29/2019 1410   BILITOT 0.5 07/29/2019 1410   BILITOT 0.3 10/29/2017 1342   GFRNONAA >60 07/29/2019 1410   GFRAA >60 07/29/2019 1410   All questions were answered to patient's stated satisfaction. Encouraged patient to call with any new concerns or questions before his next visit to the cancer center and we can certain see him sooner, if needed.     ASSESSMENT and THERAPY PLAN:   Iron deficiency anemia 1.  Iron deficiency anemia: -Is due to relative iron deficiency and poor diet. -She was last treated with IV iron on 07/20/2016. -She has switched to oral iron which she is tolerating very well. -Labs done on 07/29/2019 showed hemoglobin 11.9 and ferritin 50. -Patient reports she is feeling fatigued throughout the day.  However she did run out of her oral iron. -She denies any bright red bleeding per rectum or melena. -We we will prescribe her oral iron so she can start it back. -We will see her back in 6 months with repeat labs.     Orders Placed This Encounter  Procedures  . Lactate dehydrogenase    Standing Status:   Future    Standing Expiration Date:   08/04/2020  . CBC with Differential/Platelet    Standing Status:   Future    Standing Expiration Date:   08/04/2020  . Comprehensive metabolic panel    Standing Status:   Future    Standing Expiration Date:   08/04/2020  .  Ferritin    Standing Status:   Future    Standing Expiration Date:   08/04/2020  . Iron and TIBC    Standing Status:   Future    Standing Expiration Date:   08/04/2020  . Vitamin B12    Standing Status:   Future    Standing Expiration Date:   08/04/2020  . VITAMIN D 25 Hydroxy (Vit-D Deficiency, Fractures)    Standing Status:   Future    Standing Expiration Date:   08/04/2020    All questions were answered. The patient knows to call the  clinic with any problems, questions or concerns. We can certainly see the patient much sooner if necessary. This note was electronically signed.  I provided 30 minutes of non face-to-face telephone visit time during this encounter, and > 50% was spent counseling as documented under my assessment & plan.   Glennie Isle, NP-C 08/05/2019

## 2019-08-05 NOTE — Assessment & Plan Note (Signed)
Counseled re importance of smoking cessation but did not meet time criteria for separate billing    Each maintenance medication was reviewed in detail including emphasizing most importantly the difference between maintenance and prns and under what circumstances the prns are to be triggered using an action plan format where appropriate.  Total time for H and P, chart review, counseling, teaching device/  directly observing portions of ambulatory 02 saturation study/  and generating customized AVS unique to this office visit / charting = 30 min    >>> f/u q 6 m

## 2019-08-05 NOTE — Assessment & Plan Note (Signed)
Active smoker 08/24/2017  > try spiriva respimat 2.5  X 2 puffs each am  - PFT's  09/21/2017  FEV1 1.61 (72 % ) ratio 63  p 14 % improvement from saba p nothing prior to study with DLCO  55 % corrects to 62 % for alv volume   - alpha one screen 09/21/2017  MM level 191  -08/05/2019  After extensive coaching inhaler device,  effectiveness =    75% (short Ti)    Group D in terms of symptom/risk and laba/lama/ICS  therefore appropriate rx at this point >>>  Continue symb 160/spiriva but use them both first thing in am   Did not qualify for amb 02 - see spep a/p

## 2019-08-05 NOTE — Patient Instructions (Signed)
Plan A = Automatic = Always=    symbicort then spiriva first thing then symbicot 12 hours later   Work on inhaler technique:  relax and gently blow all the way out then take a nice smooth deep breath back in, triggering the inhaler at same time you start breathing in.  Hold for up to 5 seconds if you can. Blow out thru nose. Rinse and gargle with water when done      Plan B = Backup (to supplement plan A, not to replace it) Only use your albuterol inhaler as a rescue medication to be used if you can't catch your breath by resting or doing a relaxed purse lip breathing pattern.  - The less you use it, the better it will work when you need it. - Ok to use the inhaler up to 2 puffs  every 4 hours if you must but call for appointment if use goes up over your usual need - Don't leave home without it !!  (think of it like the spare tire for your car)   Plan C = Crisis (instead of Plan B but only if Plan B stops working) - only use your albuterol nebulizer if you first try Plan B and it fails to help > ok to use the nebulizer up to every 4 hours but if start needing it regularly call for immediate appointment   The key is to stop smoking completely before smoking completely stops you!      Please schedule a follow up visit in 6 months but call sooner if needed

## 2019-08-05 NOTE — Assessment & Plan Note (Signed)
As of 08/24/2017  DME- [facility]  o2 2lpm with sleep and as needed daytime -  12/02/2018 ambulatory O2 walk test with no desaturations.   -  08/05/2019   Walked RA  approx   200 ft  @ slow pace  stopped due to  Sob/ no desats  Adequate control on present rx, reviewed in detail with pt > no change in rx needed

## 2019-08-05 NOTE — Assessment & Plan Note (Addendum)
1.  Iron deficiency anemia: -Is due to relative iron deficiency and poor diet. -She was last treated with IV iron on 07/20/2016. -She has switched to oral iron which she is tolerating very well. -Labs done on 07/29/2019 showed hemoglobin 11.9 and ferritin 50. -Patient reports she is feeling fatigued throughout the day.  However she did run out of her oral iron. -She denies any bright red bleeding per rectum or melena. -We we will prescribe her oral iron so she can start it back. -We will see her back in 6 months with repeat labs.

## 2019-08-11 ENCOUNTER — Encounter: Payer: Self-pay | Admitting: Family Medicine

## 2019-08-20 ENCOUNTER — Telehealth: Payer: Self-pay | Admitting: Family Medicine

## 2019-08-20 ENCOUNTER — Ambulatory Visit (INDEPENDENT_AMBULATORY_CARE_PROVIDER_SITE_OTHER): Payer: Medicare Other | Admitting: Family Medicine

## 2019-08-20 ENCOUNTER — Encounter: Payer: Self-pay | Admitting: Family Medicine

## 2019-08-20 ENCOUNTER — Other Ambulatory Visit: Payer: Self-pay

## 2019-08-20 VITALS — BP 177/94 | HR 99 | Temp 97.1°F | Ht 62.0 in | Wt 96.0 lb

## 2019-08-20 DIAGNOSIS — I10 Essential (primary) hypertension: Secondary | ICD-10-CM

## 2019-08-20 DIAGNOSIS — K760 Fatty (change of) liver, not elsewhere classified: Secondary | ICD-10-CM

## 2019-08-20 DIAGNOSIS — E876 Hypokalemia: Secondary | ICD-10-CM

## 2019-08-20 DIAGNOSIS — G8929 Other chronic pain: Secondary | ICD-10-CM

## 2019-08-20 DIAGNOSIS — Z23 Encounter for immunization: Secondary | ICD-10-CM

## 2019-08-20 DIAGNOSIS — M5441 Lumbago with sciatica, right side: Secondary | ICD-10-CM

## 2019-08-20 DIAGNOSIS — Z720 Tobacco use: Secondary | ICD-10-CM

## 2019-08-20 DIAGNOSIS — Z7409 Other reduced mobility: Secondary | ICD-10-CM

## 2019-08-20 DIAGNOSIS — D508 Other iron deficiency anemias: Secondary | ICD-10-CM

## 2019-08-20 DIAGNOSIS — J432 Centrilobular emphysema: Secondary | ICD-10-CM

## 2019-08-20 MED ORDER — DULOXETINE HCL 30 MG PO CPEP
90.0000 mg | ORAL_CAPSULE | Freq: Every day | ORAL | 0 refills | Status: DC
Start: 2019-08-20 — End: 2019-11-25

## 2019-08-20 MED ORDER — AMLODIPINE BESYLATE 10 MG PO TABS: 10.0000 mg | ORAL_TABLET | Freq: Every day | ORAL | 1 refills | Status: DC

## 2019-08-20 MED ORDER — DULOXETINE HCL 30 MG PO CPEP: 90.0000 mg | ORAL_CAPSULE | Freq: Every day | ORAL | 4 refills | Status: DC

## 2019-08-20 MED ORDER — TRAZODONE HCL 150 MG PO TABS: 150.0000 mg | ORAL_TABLET | Freq: Every day | ORAL | 1 refills | Status: DC

## 2019-08-20 MED ORDER — ATORVASTATIN CALCIUM 40 MG PO TABS: 40.0000 mg | ORAL_TABLET | Freq: Every day | ORAL | 1 refills | Status: DC

## 2019-08-20 MED ORDER — TRAZODONE HCL 150 MG PO TABS
150.0000 mg | ORAL_TABLET | Freq: Every day | ORAL | 0 refills | Status: DC
Start: 2019-08-20 — End: 2019-11-25

## 2019-08-20 MED ORDER — CHANTIX STARTING MONTH PAK 0.5 MG X 11 & 1 MG X 42 PO TABS: ORAL_TABLET | ORAL | 0 refills | Status: DC

## 2019-08-20 MED ORDER — VARENICLINE TARTRATE 1 MG PO TABS: 1.0000 mg | ORAL_TABLET | Freq: Two times a day (BID) | ORAL | 0 refills | Status: DC

## 2019-08-20 NOTE — Telephone Encounter (Signed)
No notes in today's visit concerning a wheel chair.  Does she need a face to  face visit to assess for wheel chair?

## 2019-08-20 NOTE — Telephone Encounter (Signed)
Please let Jasmine [name] know who she wants me to send the new order to.

## 2019-08-20 NOTE — Addendum Note (Signed)
Addended by: Janora Norlander on: 08/20/2019 03:18 PM   Modules accepted: Orders

## 2019-08-20 NOTE — Telephone Encounter (Signed)
Please print and sign . [facility] no longer files claims so they will need to take to some one who will. Thanks

## 2019-08-20 NOTE — Telephone Encounter (Signed)
No problem I will order

## 2019-08-20 NOTE — Progress Notes (Addendum)
Subjective: CC: HTN, Fatty liver, tobacco use PCP: Janora Norlander, DO RKY:HCWCBJ C Waddle is a 64 y.o. female presenting to clinic today for:  1.  Hypertension Patient reports compliance with amlodipine.  However, she did not take her medication prior to today's visit.  Her blood pressure is typically well controlled at home.  She does note that she had hypokalemia when she was in the hospital last winter and wanted to check this again.  She does admit to nausea and vomiting prior to doctor's office visits.  She becomes very panicky and subsequently has these episodes.  However, she does not have this on a daily occurrence.  She has been gaining weight is up from 86 pounds.  2.  Tobacco use disorder Patient continues to smoke daily.  She is very interested in pursuing Chantix as the patches were ineffective in helping her stop.  She has known emphysema.  No hemoptysis.  3.  Fatty liver She is compliant with her Lipitor.  She is trying to improve her weight as above.  4.  Chronic back pain Patient with ongoing chronic back pain that is stable with Norco and Cymbalta.  She uses rolling walker for ambulation but her current one is in need of replacement.  ROS: Per HPI  Allergies  Allergen Reactions  . Lyrica [Pregabalin] Other (See Comments)    EXTREME SHAKING/TREMBLING  . Darvon [Propoxyphene]     UNSPECIFIED REACTION   . Gabapentin     Extreme shaking and trembling   . Augmentin [Amoxicillin-Pot Clavulanate] Nausea And Vomiting    Has patient had a PCN reaction causing immediate rash, facial/tongue/throat swelling, SOB or lightheadedness with hypotension:no Has patient had a PCN reaction causing severe rash involving mucus membranes or skin necrosis:No Has patient had a PCN reaction that required hospitalization:No Has patient had a PCN reaction occurring within the last 10 years:Yes--ONLY N/V If all of the above answers are "NO", then may proceed with Cephalosporin use.   .  Erythromycin Itching and Rash       . Vibramycin [Doxycycline Calcium] Itching and Rash    "[place] "   Past Medical History:  Diagnosis Date  . Allergy   . Anemia   . Anxiety   . Arthritis   . Asthma   . Carpal tunnel syndrome    bilateral  . Cigarette smoker 08/25/2017  . Complication of anesthesia    in the past has had N/V, the last few surgeries she has not been  . COPD (chronic obstructive pulmonary disease) ([place])   . Easy bruising 07/10/2016  . GERD (gastroesophageal reflux disease)   . Headache   . Heart murmur 2005   never had any problems  . History of blood transfusion 2018  . History of kidney stones   . Hypertension   . Hypoglycemia   . Hypoglycemia   . Iron deficiency anemia 07/11/2016  . Neuromuscular disorder ([place])    pt denies  . Neuropathy    both arms  . Normocytic anemia 07/29/2015  . Pancreatitis   . Pneumonia   . PONV (postoperative nausea and vomiting)   . Postoperative anemia due to acute blood loss 11/15/2015  . Sciatica   . Seizures ([place])    as a child when she would have an asthma attack. none as an adult  . Shortness of breath dyspnea     Current Outpatient Medications:  .  acetaminophen (TYLENOL) 325 MG tablet, Take 2 tablets (650 mg total) by mouth every  6 (six) hours as needed for mild pain, fever or headache (or Fever >/= 101)., Disp: 12 tablet, Rfl: 2 .  albuterol (PROAIR HFA) 108 (90 Base) MCG/ACT inhaler, USE 2 INHALATIONS EVERY 6 HOURS AS NEEDED FOR WHEEZING OR SHORTNESS OF BREATH (Needs to be seen before next refill), Disp: 25.5 g, Rfl: 1 .  amLODipine (NORVASC) 10 MG tablet, Take 1 tablet (10 mg total) by mouth daily., Disp: 90 tablet, Rfl: 1 .  atorvastatin (LIPITOR) 40 MG tablet, Take 1 tablet (40 mg total) by mouth daily., Disp: 90 tablet, Rfl: 1 .  budesonide-formoterol (SYMBICORT) 160-4.5 MCG/ACT inhaler, USE 2 INHALATIONS TWICE A DAY, Disp: 10.2 g, Rfl: 0 .  calcium carbonate (OSCAL) 1500 (600 Ca) MG TABS tablet, Take 600 mg  by mouth daily. , Disp: , Rfl:  .  cetirizine (ZYRTEC) 10 MG tablet, Take 10 mg by mouth daily as needed for allergies. Reported on 03/12/2015, Disp: , Rfl:  .  Cholecalciferol (D-3-5) 5000 units capsule, Take 5,000 Units daily by mouth., Disp: , Rfl:  .  diclofenac sodium (VOLTAREN) 1 % GEL, Apply 1 application topically daily as needed for muscle pain., Disp: , Rfl:  .  DULoxetine (CYMBALTA) 30 MG capsule, Take 3 capsules (90 mg total) by mouth daily., Disp: 270 capsule, Rfl: 4 .  feeding supplement, ENSURE ENLIVE, (ENSURE ENLIVE) LIQD, Take 237 mLs by mouth 2 (two) times daily between meals., Disp: 237 mL, Rfl: 12 .  guaiFENesin (MUCINEX) 600 MG 12 hr tablet, Take 600 mg by mouth 2 (two) times daily. , Disp: , Rfl:  .  HYDROcodone Bitartrate ER (HYSINGLA ER) 60 MG T24A, Take 1 tablet by mouth daily., Disp: , Rfl:  .  HYDROcodone-acetaminophen (NORCO) 7.5-325 MG tablet, Take 1 tablet by mouth 4 (four) times daily., Disp: , Rfl:  .  ipratropium-albuterol (DUONEB) 0.5-2.5 (3) MG/3ML SOLN, Inhale 3 mLs into the lungs 4 (four) times daily as needed (for shortness of breath/wheezing). *May use as needed, Disp: , Rfl:  .  Iron Polysacch Cmplx-B12-FA 150-0.025-1 MG CAPS, Take 1 capsule by mouth daily., Disp: 90 capsule, Rfl: 4 .  L-Methylfolate-Algae-B12-B6 (METANX) 3-90.314-2-35 MG CAPS, Take 1 tablet by mouth 2 (two) times daily., Disp: 180 capsule, Rfl: 4 .  magic mouthwash w/lidocaine SOLN, Take 5 mLs by mouth 3 (three) times daily., Disp: 240 mL, Rfl: 0 .  Omega-3 Fatty Acids (FISH OIL) 1000 MG CAPS, Take 1 capsule by mouth daily. , Disp: , Rfl:  .  OXYGEN, 2lpm with sleep and as needed during the day, Disp: , Rfl:  .  pantoprazole (PROTONIX) 40 MG tablet, Take 1 tablet (40 mg total) by mouth daily., Disp: 90 tablet, Rfl: 2 .  Tiotropium Bromide Monohydrate (SPIRIVA RESPIMAT) 2.5 MCG/ACT AERS, Inhale 1 puff into the lungs daily. (Patient taking differently: Inhale 2 puffs into the lungs daily. ), Disp:  4 g, Rfl: 2 .  tizanidine (ZANAFLEX) 6 MG capsule, Take 1 capsule 3 (three) times daily by mouth., Disp: , Rfl:  .  traZODone (DESYREL) 150 MG tablet, Take 1 tablet (150 mg total) by mouth at bedtime. (Patient taking differently: 1 1/2 tablet at bedtime), Disp: 90 tablet, Rfl: 1 Social History   Socioeconomic History  . Marital status: Married    Spouse name: Not on file  . Number of children: Not on file  . Years of education: Not on file  . Highest education level: Not on file  Occupational History  . Not on file  Tobacco Use  .  Smoking status: Current Every Day Smoker    Packs/day: 2.00    Years: 35.00    Pack years: 70.00    Types: Cigarettes  . Smokeless tobacco: Never Used  . Tobacco comment: trying to quit  Vaping Use  . Vaping Use: Never used  Substance and Sexual Activity  . Alcohol use: Yes    Alcohol/week: 2.0 standard drinks    Types: 2 Glasses of wine per week    Comment: occasionally  . Drug use: No  . Sexual activity: Not on file  Other Topics Concern  . Not on file  Social History Narrative  . Not on file   Social Determinants of Health   Financial Resource Strain:   . Difficulty of Paying Living Expenses:   Food Insecurity:   . Worried About Charity fundraiser in the Last Year:   . Arboriculturist in the Last Year:   Transportation Needs:   . Film/video editor (Medical):   Marland Kitchen Lack of Transportation (Non-Medical):   Physical Activity:   . Days of Exercise per Week:   . Minutes of Exercise per Session:   Stress:   . Feeling of Stress :   Social Connections:   . Frequency of Communication with Friends and Family:   . Frequency of Social Gatherings with Friends and Family:   . Attends Religious Services:   . Active Member of Clubs or Organizations:   . Attends Archivist Meetings:   Marland Kitchen Marital Status:   Intimate Partner Violence:   . Fear of Current or Ex-Partner:   . Emotionally Abused:   Marland Kitchen Physically Abused:   . Sexually  Abused:    Family History  Problem Relation Age of Onset  . Heart disease Mother   . Diabetes Mother   . Hypertension Mother   . Hyperlipidemia Mother   . COPD Mother        smoked  . Cancer Mother        bile duct  . COPD Father   . Diabetes Father   . Heart disease Father   . Cancer Father        throat  . Dementia Father   . Asthma Son   . Hypertension Sister   . Hyperlipidemia Sister   . Hypertension Brother   . Hyperlipidemia Brother   . Hypertension Brother   . Hyperlipidemia Brother   . Emphysema Maternal Grandmother        smoked  . Colon cancer Neg Hx   . Colon polyps Neg Hx     Objective: Office vital signs reviewed. BP (!) 177/94   Pulse 99   Temp (!) 97.1 F (36.2 C) (Temporal)   Ht 5\' 2"  (1.575 m)   Wt 96 lb (43.5 kg)   SpO2 96%   BMI 17.56 kg/m   Physical Examination:  General: Awake, alert, malnourished, chronically ill appearing, smells of tobacco. No acute distress HEENT: sclera white, MMM Cardio: regular rate and rhythm, S1S2 heard, no murmurs appreciated Pulm: Globally decreased breath sounds.  Otherwise, clear to auscultation bilaterally, no wheezes, rhonchi or rales; normal work of breathing on room air Extremities: warm, well perfused, No edema, cyanosis or clubbing; +2 pulses bilaterally MSK: uses walker for ambulation  Assessment/ Plan: 64 y.o. female   1. Chronic right-sided low back pain with right-sided sciatica Cymbalta renewed.  Face-to-face visit for new rolling walker performed today.  This has been [facility]ed to [facility] per her request  2.  Essential hypertension Not controlled patient did not take medication today.  Future lab for lipid to be obtained at her next hematology lab draw - Lipid panel; Future  3. Hepatic steatosis - Lipid panel; Future  4. Centrilobular emphysema ([place]) Followed by pulmonology  5. Other iron deficiency anemia Followed by hematology  6. Hypokalemia Check magnesium.  Last potassium  was done about 2-1/2 weeks ago and was 3.4. - Magnesium; Future  7. Tobacco use Chantix ordered.  Counseled on possible side effects.  I had like to see her back in 3 months for recheck. - varenicline (CHANTIX STARTING MONTH PAK) 0.5 MG X 11 & 1 MG X 42 tablet; Take 0.5 mg tablet by mouth once daily x3 days, then 0.5 mg tablet twice daily x4 days, then increase to one 1 mg tablet twice daily.  Dispense: 53 tablet; Refill: 0 - varenicline (CHANTIX CONTINUING MONTH PAK) 1 MG tablet; Take 1 tablet (1 mg total) by mouth 2 (two) times daily.  Dispense: 180 tablet; Refill: 0  Orders Placed This Encounter  Procedures  . For home use only DME 4 wheeled rolling walker with seat ([id number])    Order Specific Question:   Patient needs a walker to treat with the following condition    Answer:   Impaired mobility and endurance [3614431]    Order Specific Question:   Patient needs a walker to treat with the following condition    Answer:   Chronic back pain [341141]  . Pneumococcal conjugate vaccine 13-valent  . Lipid panel    Standing Status:   Future    Standing Expiration Date:   08/19/2020  . Magnesium    Standing Status:   Future    Standing Expiration Date:   08/19/2020    Janora Norlander, DO [place] [phone]

## 2019-08-21 MED ORDER — TRAZODONE HCL 150 MG PO TABS: 150.0000 mg | ORAL_TABLET | Freq: Every day | ORAL | 1 refills | Status: DC

## 2019-08-21 NOTE — Telephone Encounter (Signed)
Pt would like to pickup Rx today

## 2019-08-21 NOTE — Addendum Note (Signed)
Addended by: Janora Norlander on: 08/21/2019 08:53 AM   Modules accepted: Orders

## 2019-08-21 NOTE — Telephone Encounter (Signed)
No problem.  Placed up front for pick up

## 2019-08-21 NOTE — Addendum Note (Signed)
Addended by: Janora Norlander on: 08/21/2019 08:58 AM   Modules accepted: Orders

## 2019-09-03 ENCOUNTER — Encounter: Payer: Self-pay | Admitting: Family Medicine

## 2019-09-03 ENCOUNTER — Encounter (HOSPITAL_COMMUNITY): Payer: Self-pay | Admitting: Nurse Practitioner

## 2019-09-03 ENCOUNTER — Other Ambulatory Visit (HOSPITAL_COMMUNITY): Payer: Self-pay | Admitting: Hematology

## 2019-09-03 DIAGNOSIS — D508 Other iron deficiency anemias: Secondary | ICD-10-CM

## 2019-09-03 DIAGNOSIS — Z720 Tobacco use: Secondary | ICD-10-CM

## 2019-09-03 MED ORDER — VARENICLINE TARTRATE 1 MG PO TABS: 1.0000 mg | ORAL_TABLET | Freq: Two times a day (BID) | ORAL | 0 refills | Status: DC

## 2019-09-03 MED ORDER — IRON POLYSACCH CMPLX-B12-FA 150-0.025-1 MG PO CAPS: 1.0000 | ORAL_CAPSULE | Freq: Every day | ORAL | 4 refills | Status: DC

## 2019-09-03 MED ORDER — CHANTIX STARTING MONTH PAK 0.5 MG X 11 & 1 MG X 42 PO TABS: ORAL_TABLET | ORAL | 0 refills | Status: DC

## 2019-09-04 ENCOUNTER — Other Ambulatory Visit (HOSPITAL_COMMUNITY): Payer: Self-pay | Admitting: *Deleted

## 2019-09-05 ENCOUNTER — Encounter: Payer: Self-pay | Admitting: Family Medicine

## 2019-09-05 ENCOUNTER — Other Ambulatory Visit: Payer: Self-pay | Admitting: Internal Medicine

## 2019-09-05 ENCOUNTER — Other Ambulatory Visit: Payer: Self-pay | Admitting: Family Medicine

## 2019-09-09 ENCOUNTER — Other Ambulatory Visit: Payer: Self-pay | Admitting: Family Medicine

## 2019-09-09 ENCOUNTER — Other Ambulatory Visit: Payer: Self-pay

## 2019-09-09 DIAGNOSIS — J42 Unspecified chronic bronchitis: Secondary | ICD-10-CM

## 2019-09-09 MED ORDER — PANTOPRAZOLE SODIUM 40 MG PO TBEC: 40.0000 mg | DELAYED_RELEASE_TABLET | Freq: Every day | ORAL | 2 refills | Status: DC

## 2019-09-09 MED ORDER — BUDESONIDE-FORMOTEROL FUMARATE 160-4.5 MCG/ACT IN AERO: INHALATION_SPRAY | RESPIRATORY_TRACT | 0 refills | Status: DC

## 2019-09-10 ENCOUNTER — Other Ambulatory Visit (HOSPITAL_COMMUNITY): Payer: Self-pay | Admitting: *Deleted

## 2019-09-11 ENCOUNTER — Other Ambulatory Visit (HOSPITAL_COMMUNITY): Payer: Self-pay | Admitting: Hematology

## 2019-09-11 ENCOUNTER — Telehealth: Payer: Self-pay | Admitting: Family Medicine

## 2019-09-11 DIAGNOSIS — D508 Other iron deficiency anemias: Secondary | ICD-10-CM

## 2019-09-11 DIAGNOSIS — J42 Unspecified chronic bronchitis: Secondary | ICD-10-CM

## 2019-09-11 NOTE — Telephone Encounter (Signed)
  Prescription Request  09/11/2019  What is the name of the medication or equipment? Symbicort  Have you contacted your pharmacy to request a refill? (if applicable) Yes  Which pharmacy would you like this sent to? [facility]   Patient notified that their request is being sent to the clinical staff for review and that they should receive a response within 2 business days.   [facility].  Mail Order  90 day supply  Please call husband about this & leave message on vm.

## 2019-09-12 MED ORDER — BUDESONIDE-FORMOTEROL FUMARATE 160-4.5 MCG/ACT IN AERO: INHALATION_SPRAY | RESPIRATORY_TRACT | 0 refills | Status: DC

## 2019-09-12 NOTE — Telephone Encounter (Signed)
90 day supply of Symbicort sent to [facility] as requested and pt's husband aware.

## 2019-09-23 ENCOUNTER — Encounter: Payer: Self-pay | Admitting: Family Medicine

## 2019-09-23 ENCOUNTER — Other Ambulatory Visit: Payer: Self-pay | Admitting: Family Medicine

## 2019-09-23 DIAGNOSIS — Z720 Tobacco use: Secondary | ICD-10-CM

## 2019-09-23 MED ORDER — BUPROPION HCL ER (SR) 150 MG PO TB12: ORAL_TABLET | ORAL | 3 refills | Status: DC

## 2019-11-25 ENCOUNTER — Ambulatory Visit (INDEPENDENT_AMBULATORY_CARE_PROVIDER_SITE_OTHER): Payer: Medicare Other | Admitting: Family Medicine

## 2019-11-25 ENCOUNTER — Telehealth: Payer: Self-pay | Admitting: Family Medicine

## 2019-11-25 ENCOUNTER — Encounter: Payer: Self-pay | Admitting: Family Medicine

## 2019-11-25 VITALS — BP 155/104

## 2019-11-25 DIAGNOSIS — Z9889 Other specified postprocedural states: Secondary | ICD-10-CM | POA: Diagnosis not present

## 2019-11-25 DIAGNOSIS — R569 Unspecified convulsions: Secondary | ICD-10-CM

## 2019-11-25 DIAGNOSIS — Z720 Tobacco use: Secondary | ICD-10-CM

## 2019-11-25 DIAGNOSIS — H9192 Unspecified hearing loss, left ear: Secondary | ICD-10-CM

## 2019-11-25 NOTE — Telephone Encounter (Signed)
Tried calling pt x2. Number busy.

## 2019-11-25 NOTE — Progress Notes (Signed)
Telephone visit  Subjective: CC: f/u tobacco use PCP: Janora Norlander, DO YPP:JKDTOI Jasmine [name] is a 64 y.o. female calls for telephone consult today. Patient provides verbal consent for consult held via phone.  Due to COVID-19 pandemic this visit was conducted virtually. This visit type was conducted due to national recommendations for restrictions regarding the COVID-19 Pandemic (e.g. social distancing, sheltering in place) in an effort to limit this patient's exposure and mitigate transmission in our community. All issues noted in this document were discussed and addressed.  A physical exam was not performed with this format.   Location of patient: home Location of provider: [facility] Others present for call: spouse  1. Difficulty hearing She notes that hearing loss started over the last few months.  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  Denies any dizziness, falls.  No drainage from the ear or fevers.  2. Smoking/seizure-like activity She never started the Wellbutrin because she was worried about her childhood seizures being an issue.  Additionally she goes on to report some shaking and trembling that she experienced on gabapentin.  Her previous PCP felt this was not seizure activity and it seemed to totally resolve after discontinuing the gabapentin.  She has never been evaluated by a neurologist but would be interested in this potentially.  She is going to try the patches again.  ROS: Per HPI  Allergies  Allergen Reactions  . Lyrica [Pregabalin] Other (See Comments)    EXTREME SHAKING/TREMBLING  . Darvon [Propoxyphene]     UNSPECIFIED REACTION   . Gabapentin     Extreme shaking and trembling   . Augmentin [Amoxicillin-Pot Clavulanate] Nausea And Vomiting    Has patient had a PCN reaction causing immediate rash, facial/tongue/throat swelling, SOB or lightheadedness with hypotension:no Has patient had a PCN reaction causing severe rash  involving mucus membranes or skin necrosis:No Has patient had a PCN reaction that required hospitalization:No Has patient had a PCN reaction occurring within the last 10 years:Yes--ONLY N/V If all of the above answers are "NO", then may proceed with Cephalosporin use.   . Erythromycin Itching and Rash       . Vibramycin [Doxycycline Calcium] Itching and Rash    "[place] "   Past Medical History:  Diagnosis Date  . Allergy   . Anemia   . Anxiety   . Arthritis   . Asthma   . Carpal tunnel syndrome   . Cigarette smoker 08/25/2017  . Complication of anesthesia    in the past has had N/V, the last few surgeries she has not been  . COPD (chronic obstructive pulmonary disease) ([place])   . Easy bruising 07/10/2016  . GERD (gastroesophageal reflux disease)   . Headache   . Heart murmur 2005   never had any problems  . History of blood transfusion 2018  . History of kidney stones   . Hypertension   . Hypoglycemia   . Hypoglycemia   . Iron deficiency anemia 07/11/2016  . Neuropathy    both arms  . Normocytic anemia 07/29/2015  . Pancreatitis   . Pneumonia   . PONV (postoperative nausea and vomiting)   . Postoperative anemia due to acute blood loss 11/15/2015  . Sciatica   . Seizures ([place])    as a child when she would have an asthma attack. none as an adult  . Shortness of breath dyspnea     Current Outpatient Medications:  .  acetaminophen (TYLENOL) 325 MG tablet,  Take 2 tablets (650 mg total) by mouth every 6 (six) hours as needed for mild pain, fever or headache (or Fever >/= 101)., Disp: 12 tablet, Rfl: 2 .  albuterol (PROAIR HFA) 108 (90 Base) MCG/ACT inhaler, USE 2 INHALATIONS EVERY 6 HOURS AS NEEDED FOR WHEEZING OR SHORTNESS OF BREATH (NEED TO BE SEEN BEFORE NEXT REFILL), Disp: 25.5 g, Rfl: 1 .  amLODipine (NORVASC) 10 MG tablet, Take 1 tablet (10 mg total) by mouth daily., Disp: 90 tablet, Rfl: 1 .  atorvastatin (LIPITOR) 40 MG tablet, Take 1 tablet (40 mg total) by mouth  daily., Disp: 90 tablet, Rfl: 1 .  budesonide-formoterol (SYMBICORT) 160-4.5 MCG/ACT inhaler, USE 2 INHALATIONS TWICE A DAY, Disp: 20.4 g, Rfl: 0 .  cetirizine (ZYRTEC) 10 MG tablet, Take 10 mg by mouth daily as needed for allergies. Reported on 03/12/2015, Disp: , Rfl:  .  Cholecalciferol (D-3-5) 5000 units capsule, Take 5,000 Units daily by mouth., Disp: , Rfl:  .  diclofenac sodium (VOLTAREN) 1 % GEL, Apply 1 application topically daily as needed for muscle pain., Disp: , Rfl:  .  DULoxetine (CYMBALTA) 30 MG capsule, Take 3 capsules (90 mg total) by mouth daily., Disp: 270 capsule, Rfl: 4 .  feeding supplement, ENSURE ENLIVE, (ENSURE ENLIVE) LIQD, Take 237 mLs by mouth 2 (two) times daily between meals., Disp: 237 mL, Rfl: 12 .  guaiFENesin (MUCINEX) 600 MG 12 hr tablet, Take 600 mg by mouth 2 (two) times daily. , Disp: , Rfl:  .  HYDROcodone Bitartrate ER (HYSINGLA ER) 60 MG T24A, Take 1 tablet by mouth daily., Disp: , Rfl:  .  HYDROcodone-acetaminophen (NORCO) 7.5-325 MG tablet, Take 1 tablet by mouth 4 (four) times daily., Disp: , Rfl:  .  ipratropium-albuterol (DUONEB) 0.5-2.5 (3) MG/3ML SOLN, Inhale 3 mLs into the lungs 4 (four) times daily as needed (for shortness of breath/wheezing). *May use as needed, Disp: , Rfl:  .  L-Methylfolate-Algae-B12-B6 (METANX) 3-90.314-2-35 MG CAPS, TAKE 1 TABLET BY MOUTH TWO TIMES A DAY, Disp: 180 capsule, Rfl: 4 .  Omega-3 Fatty Acids (FISH OIL) 1000 MG CAPS, Take 1 capsule by mouth daily. , Disp: , Rfl:  .  OXYGEN, 2lpm with sleep and as needed during the day, Disp: , Rfl:  .  pantoprazole (PROTONIX) 40 MG tablet, Take 1 tablet (40 mg total) by mouth daily., Disp: 90 tablet, Rfl: 2 .  POLY-IRON 150 FORTE 150-25-1 MG-MCG-MG CAPS, Take 1 capsule by mouth once daily, Disp: 30 capsule, Rfl: 0 .  Tiotropium Bromide Monohydrate (SPIRIVA RESPIMAT) 2.5 MCG/ACT AERS, Inhale 2 puffs into the lungs daily., Disp: 12 g, Rfl: 3 .  tizanidine (ZANAFLEX) 6 MG capsule, Take 1  capsule 3 (three) times daily by mouth., Disp: , Rfl:  .  traZODone (DESYREL) 150 MG tablet, Take 1-2 tablets (150-300 mg total) by mouth at bedtime., Disp: 180 tablet, Rfl: 1  Assessment/ Plan: 64 y.o. female   1. Hearing loss of left ear, unspecified hearing loss type She is going to perform self cerumen debridement with OTC Debrox and get back to me if she wants to see the ENT again  2. History of ear surgery As above  3. Seizure-like activity ([place]) I highly recommended that she see a neurologist for evaluation.  She will like to hold off on this for now.  In the interim I have advised against any medications which lower the seizure threshold including Wellbutrin, Chantix and tramadol.  She voiced good understanding of the plan.  We  will plan for [facility] neurologic referral as she also needs a sleep study.  4. Tobacco use Going to trial patches again and will let me know she needs refills on the patches.   Start time: 1:06pm End time: 1:19pm  Total time spent on patient care (including telephone call/ virtual visit): 19 minutes  [place], [place] [phone]

## 2019-11-25 NOTE — Telephone Encounter (Signed)
Please have pt see RN in 1 week for BP check and have her bring in her home BP cuff

## 2019-11-25 NOTE — Telephone Encounter (Signed)
Appt made patient aware  °

## 2019-12-02 ENCOUNTER — Ambulatory Visit: Payer: Medicare Other

## 2019-12-15 ENCOUNTER — Other Ambulatory Visit: Payer: Self-pay | Admitting: Family Medicine

## 2019-12-15 DIAGNOSIS — J42 Unspecified chronic bronchitis: Secondary | ICD-10-CM

## 2020-01-27 ENCOUNTER — Telehealth: Payer: Self-pay | Admitting: Internal Medicine

## 2020-01-27 NOTE — Telephone Encounter (Signed)
Called and spoke to pt and pt's husband. Appt has been made for 11/29 at 1115 in [place]. Pt verbalized understanding and denied any further questions or concerns at this time.

## 2020-01-30 ENCOUNTER — Other Ambulatory Visit: Payer: Self-pay | Admitting: Family Medicine

## 2020-02-02 ENCOUNTER — Ambulatory Visit: Payer: TRICARE For Life (TFL) | Admitting: Internal Medicine

## 2020-02-02 NOTE — Progress Notes (Deleted)
Subjective:     Patient ID: Jasmine [name], female   DOB: 07-01-1955    MRN: [id number]    Brief patient profile: [id number]   active smoker/MM  dx with asthma as child but by HS outgrew it until her 61's with flares but better between until early 2000s and maintain on multliple inhalers and then 02/2016 hosp in [place] then [facility] April 2019 and newly started on 02 = 2lpm as needed daytime and hs and referred to pulmonary clinic 08/24/2017 by Dr   Jasmine [name] with GOLD II spirometry documented 09/21/17     Admit date: 06/30/2017 Discharge date: 07/09/2017    Recommendations for Outpatient Follow-up:  1. pls refer to sleep medicine for osa testing/epworht/stopbang 2. pls continue cessation counseling for Tob 3. Needs cxr 1 mo 4. Consider Megace/chantix? 5. Recheck LFT and Magnesium in 1-2 weeks---has elevated LFT unknown sig [?CVC liver]  Discharge Diagnoses:  Principal Problem:   Acute respiratory failure with hypoxia (HCC) Active Problems:   Chronic back pain   HTN (hypertension)   Chronic narcotic use   Hypokalemia   COPD with acute exacerbation (HCC)   Malnutrition of moderate degree   Discharge Condition: please refert  Diet recommendation: eat anything to gain weight for now       [facility] Weights   06/30/17 2122 07/01/17 0500 07/02/17 0412  Weight: 47.2 kg (104 lb 0.9 oz) 47.2 kg (104 lb 0.9 oz) 48.1 kg (106 lb 0.7 oz)    History of present illness:  Please see note from Dr. Paulene [name] "64 y.o.femalewith a history of COPD, GERD, neuropathy, chronic pain on chronic narcotics, hypertension. Patient presents with worsening shortness of breathover the past 3 to 4 weeks. She presented to ED with acute respiratory distress with hypoxia and subsequently started on bipap"    Hospital Course:  1. Acute respiratory failure with hypoxia-patient says that she responded very well to nightly BiPAP. She was able to sleep for the first time in a very long time.  ?OSA-referrinf to Sleep as OP.  Needs screening fro pulm htn as well? Could benefit from Cardio-pulm rehab if avail  [place] this [place] 2. COPD with acute exacerbation- continue current treatments with IV steroids, antibiotics and continuous around-the-clock neb treatments-narrowed and tapered on d/c home--5 day steroid taper 3. Essential Hypertension-stable. 4. Hypokalemia-replacement ordered. recheck mag as op 5. Chronic pain with opioid dependence-resume home pain med management regimen which has been stable per patient.        08/24/2017 1st [facility] office visit/ Jasmine [name]   Chief Complaint  Patient presents with  . Pulmonary Consult    Referred by Dr. Adam [name].  Pt states dxed with COPD approx 10 years ago. She states she was hospitalized in April 2019 for COPD flare and sent home with o2.  She states she gets SOB with minimal exertion such as walking from room to room at home. She uses proair 3 x per wk on average and has duoneb but has not needed it since starting o2.    MMRC3 = can't walk 100 yards even at a slow pace at a flat grade s stopping due to sob  Even on 02  Rattling cough more prod first thing in am = min mucoid  maint on symbicort 160    Off prednisone x sev weeks and about the same since stopped just on symbicort 160 2bid  And   2lpm with activity  Sleeping  2lpm and 30 degrees    rec Plan A =  Automatic =   Symbicort 160  Take 2 puffs first thing in am and then another 2 puffs about 12 hours later and spiriva 2 pffs each am  Work on inhaler technique:     Plan B = Backup Only use your albuterol (Proair)as a rescue medication Plan C = Crisis - only use your albuterol nebulizer if you first try Plan B      08/05/2019  f/u ov/Jasmine [name] re:  GOLD II/ symb /spiriva still smoking  - had Moderna vaccine  Chief Complaint  Patient presents with  . Follow-up    Breathing has been worse over the past 2 months- she is waking up in the morning feeling SOB and has to use  her rescue inhaler. She is using the rescue inhaler most days.   Dyspnea: foot slows her down> sob  Cough: minimal in am / white  Sleeping: on 02 flat bed  SABA use: not using symb first thing as rec and uses saba first  02: 2lpm hs only / wants portable system rec Plan A = Automatic = Always=    symbicort then spiriva first thing then symbicot 12 hours later  Work on inhaler technique:  Plan B = Backup (to supplement plan A, not to replace it) Only use your albuterol inhaler as a rescue medication Plan C = Crisis (instead of Plan B but only if Plan B stops working) - only use your albuterol nebulizer if you first try Plan B and it fails to help > ok to use the nebulizer up to every 4 hours but if start needing it regularly call for immediate appointment The key is to stop smoking completely before smoking completely stops you!       02/02/2020  f/u ov/[place] office/Jasmine [name] re: GOLD II/ 02 dep  No chief complaint on file.    Dyspnea:  *** Cough: *** Sleeping: *** SABA use: *** 02: ***   No obvious day to day or daytime variability or assoc excess/ purulent sputum or mucus plugs or hemoptysis or cp or chest tightness, subjective wheeze or overt sinus or hb symptoms.   *** without nocturnal  or early am exacerbation  of respiratory  c/o's or need for noct saba. Also denies any obvious fluctuation of symptoms with weather or environmental changes or other aggravating or alleviating factors except as outlined above   No unusual exposure hx or h/o childhood pna/ asthma or knowledge of premature birth.  Current Allergies, Complete Past Medical History, Past Surgical History, Family History, and Social History were reviewed in [employer] record.  ROS  The following are not active complaints unless bolded Hoarseness, sore throat, dysphagia, dental problems, itching, sneezing,  nasal congestion or discharge of excess mucus or purulent secretions, ear ache,    fever, chills, sweats, unintended wt loss or wt gain, classically pleuritic or exertional cp,  orthopnea pnd or arm/hand swelling  or leg swelling, presyncope, palpitations, abdominal pain, anorexia, nausea, vomiting, diarrhea  or change in bowel habits or change in bladder habits, change in stools or change in urine, dysuria, hematuria,  rash, arthralgias, visual complaints, headache, numbness, weakness or ataxia or problems with walking or coordination,  change in mood or  memory.        No outpatient medications have been marked as taking for the 02/02/20 encounter (Appointment) with Tanda Rockers, MD.                   Objective:   Physical Exam  02/02/2020       *** 08/05/2019           98.6  01/09/2018        105  09/21/2017        104   08/24/17 105 lb 9.6 oz (47.9 kg)  07/31/17 106 lb (48.1 kg)  07/26/17 105 lb 3.2 oz (47.7 kg)     Vital signs reviewed  02/02/2020  - Note at rest 02 sats  ***% on ***     ,  Mod barrel  contour       R foot drop                         Assessment:

## 2020-02-04 ENCOUNTER — Inpatient Hospital Stay (HOSPITAL_COMMUNITY): Payer: TRICARE For Life (TFL)

## 2020-02-05 ENCOUNTER — Other Ambulatory Visit (HOSPITAL_COMMUNITY): Payer: Self-pay

## 2020-02-05 DIAGNOSIS — E876 Hypokalemia: Secondary | ICD-10-CM

## 2020-02-05 DIAGNOSIS — D508 Other iron deficiency anemias: Secondary | ICD-10-CM

## 2020-02-06 ENCOUNTER — Other Ambulatory Visit (HOSPITAL_COMMUNITY)
Admission: RE | Admit: 2020-02-06 | Discharge: 2020-02-06 | Disposition: A | Discharge status: Home / Self Care | Payer: Medicare Other | Source: Ambulatory Visit | Attending: Family Medicine | Admitting: Family Medicine

## 2020-02-06 ENCOUNTER — Other Ambulatory Visit: Payer: Self-pay

## 2020-02-06 ENCOUNTER — Inpatient Hospital Stay (HOSPITAL_COMMUNITY): Payer: Medicare Other | Attending: Hematology

## 2020-02-06 DIAGNOSIS — Z87442 Personal history of urinary calculi: Secondary | ICD-10-CM | POA: Insufficient documentation

## 2020-02-06 DIAGNOSIS — K219 Gastro-esophageal reflux disease without esophagitis: Secondary | ICD-10-CM | POA: Diagnosis not present

## 2020-02-06 DIAGNOSIS — F17210 Nicotine dependence, cigarettes, uncomplicated: Secondary | ICD-10-CM | POA: Insufficient documentation

## 2020-02-06 DIAGNOSIS — E559 Vitamin D deficiency, unspecified: Secondary | ICD-10-CM | POA: Insufficient documentation

## 2020-02-06 DIAGNOSIS — M1990 Unspecified osteoarthritis, unspecified site: Secondary | ICD-10-CM | POA: Insufficient documentation

## 2020-02-06 DIAGNOSIS — Z79899 Other long term (current) drug therapy: Secondary | ICD-10-CM | POA: Insufficient documentation

## 2020-02-06 DIAGNOSIS — E876 Hypokalemia: Secondary | ICD-10-CM

## 2020-02-06 DIAGNOSIS — D509 Iron deficiency anemia, unspecified: Secondary | ICD-10-CM | POA: Insufficient documentation

## 2020-02-06 DIAGNOSIS — J441 Chronic obstructive pulmonary disease with (acute) exacerbation: Secondary | ICD-10-CM | POA: Diagnosis not present

## 2020-02-06 DIAGNOSIS — I10 Essential (primary) hypertension: Secondary | ICD-10-CM | POA: Insufficient documentation

## 2020-02-06 DIAGNOSIS — K760 Fatty (change of) liver, not elsewhere classified: Secondary | ICD-10-CM | POA: Diagnosis present

## 2020-02-06 DIAGNOSIS — D508 Other iron deficiency anemias: Secondary | ICD-10-CM

## 2020-02-06 LAB — COMPREHENSIVE METABOLIC PANEL
ALT: 35 U/L (ref 0–44)
AST: 43 U/L — ABNORMAL HIGH (ref 15–41)
Albumin: 4.1 g/dL (ref 3.5–5.0)
Alkaline Phosphatase: 64 U/L (ref 38–126)
Anion gap: 15 (ref 5–15)
BUN: 9 mg/dL (ref 8–23)
CO2: 22 mmol/L (ref 22–32)
Calcium: 9.4 mg/dL (ref 8.9–10.3)
Chloride: 101 mmol/L (ref 98–111)
Creatinine, Ser: 0.47 mg/dL (ref 0.44–1.00)
GFR, Estimated: >60 mL/min (ref >60)
Glucose, Bld: 90 mg/dL (ref 70–99)
Potassium: 4 mmol/L (ref 3.5–5.1)
Sodium: 138 mmol/L (ref 135–145)
Total Bilirubin: 0.5 mg/dL (ref 0.3–1.2)
Total Protein: 7.2 g/dL (ref 6.5–8.1)

## 2020-02-06 LAB — MAGNESIUM: Magnesium: 1.7 mg/dL (ref 1.7–2.4)

## 2020-02-06 LAB — CBC WITH DIFFERENTIAL/PLATELET
Abs Immature Granulocytes: 0.01 10*3/uL (ref 0.00–0.07)
Basophils Absolute: 0.1 10*3/uL (ref 0.0–0.1)
Basophils Relative: 1 %
Eosinophils Absolute: 0.1 10*3/uL (ref 0.0–0.5)
Eosinophils Relative: 3 %
HCT: 38.4 % (ref 36.0–46.0)
Hemoglobin: 11.9 g/dL — ABNORMAL LOW (ref 12.0–15.0)
Immature Granulocytes: 0 %
Lymphocytes Relative: 30 %
Lymphs Abs: 1.5 10*3/uL (ref 0.7–4.0)
MCH: 32.9 pg (ref 26.0–34.0)
MCHC: 31 g/dL (ref 30.0–36.0)
MCV: 106.1 fL — ABNORMAL HIGH (ref 80.0–100.0)
Monocytes Absolute: 0.6 10*3/uL (ref 0.1–1.0)
Monocytes Relative: 11 %
Neutro Abs: 2.7 10*3/uL (ref 1.7–7.7)
Neutrophils Relative %: 55 %
Platelets: 219 10*3/uL (ref 150–400)
RBC: 3.62 MIL/uL — ABNORMAL LOW (ref 3.87–5.11)
RDW: 13.4 % (ref 11.5–15.5)
WBC: 4.9 10*3/uL (ref 4.0–10.5)
nRBC: 0 % (ref 0.0–0.2)

## 2020-02-06 LAB — LIPID PANEL
Cholesterol: 265 mg/dL — ABNORMAL HIGH (ref 0–200)
HDL: >135 mg/dL (ref >40)
Triglycerides: 84 mg/dL (ref <150)
VLDL: 17 mg/dL (ref 0–40)

## 2020-02-06 LAB — LACTATE DEHYDROGENASE: LDH: 165 U/L (ref 98–192)

## 2020-02-06 LAB — IRON AND TIBC
Iron: 66 ug/dL (ref 28–170)
Saturation Ratios: 15 % (ref 10.4–31.8)
TIBC: 448 ug/dL (ref 250–450)
UIBC: 382 ug/dL

## 2020-02-06 LAB — VITAMIN B12: Vitamin B-12: 3275 pg/mL — ABNORMAL HIGH (ref 180–914)

## 2020-02-06 LAB — VITAMIN D 25 HYDROXY (VIT D DEFICIENCY, FRACTURES): Vit D, 25-Hydroxy: 45.25 ng/mL (ref 30–100)

## 2020-02-06 LAB — FERRITIN: Ferritin: 25 ng/mL (ref 11–307)

## 2020-02-10 ENCOUNTER — Other Ambulatory Visit (HOSPITAL_COMMUNITY): Payer: Self-pay | Admitting: Hematology

## 2020-02-10 DIAGNOSIS — D508 Other iron deficiency anemias: Secondary | ICD-10-CM

## 2020-02-11 ENCOUNTER — Telehealth (HOSPITAL_COMMUNITY): Payer: TRICARE For Life (TFL) | Admitting: Nurse Practitioner

## 2020-02-13 ENCOUNTER — Other Ambulatory Visit: Payer: Self-pay

## 2020-02-13 ENCOUNTER — Inpatient Hospital Stay (HOSPITAL_BASED_OUTPATIENT_CLINIC_OR_DEPARTMENT_OTHER): Payer: Medicare Other | Admitting: Hematology and Oncology

## 2020-02-13 DIAGNOSIS — D508 Other iron deficiency anemias: Secondary | ICD-10-CM | POA: Diagnosis not present

## 2020-02-13 NOTE — Progress Notes (Addendum)
[facility] Follow up:    Jasmine Norlander, DO [place] [id number]   DIAGNOSIS: Iron deficiency anemia  CURRENT THERAPY: Oral iron therapy  INTERVAL HISTORY:  Jasmine [name] 64 y.o. female was called for iron deficiency anemia.   She is here for a telephone visit. She tells me her energy and appetite are good, she has been taking oral iron once a day. She denies any easy bruising or bleeding. Denies any nausea, vomiting, or diarrhea. Denies any new pains. Had not noticed any recent bleeding such as epistaxis, hematuria or hematochezia. Denies recent chest pain on exertion, shortness of breath on minimal exertion, pre-syncopal episodes, or palpitations. Denies any numbness or tingling in hands or feet. Denies any recent fevers, infections, or recent hospitalizations.    Patient Active Problem List   Diagnosis Date Noted  . Pancreatitis, acute 01/06/2019  . Acute pancreatitis 01/05/2019  . Cigarette smoker 08/25/2017  . Chronic respiratory failure with hypoxia ([place]) 08/25/2017  . Centrilobular emphysema ([place]) 07/09/2017  . Aortic atherosclerosis ([place]) 07/09/2017  . Hepatic steatosis 07/09/2017  . Malnutrition of moderate degree 07/02/2017  . Acute respiratory failure with hypoxia ([place]) 06/30/2017  . COPD with acute exacerbation ([place]) 06/30/2017  . 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/2016  . Tobacco abuse 08/04/2016  . Iron deficiency anemia 07/11/2016  . Easy bruising 07/10/2016  . Lumbar stenosis 11/11/2015  . Leg pain 09/24/2015  . Vitamin D deficiency 07/29/2015  . Chronic back pain 02/22/2015  . HTN (hypertension) 02/22/2015  . Chronic narcotic use 02/22/2015  . COPD GOLD II  02/22/2015  . Headache 02/22/2015    is allergic to lyrica [pregabalin], darvon [propoxyphene], gabapentin, augmentin [amoxicillin-pot clavulanate], erythromycin, and vibramycin [doxycycline calcium].  MEDICAL HISTORY: Past Medical History:  Diagnosis  Date  . Allergy   . Anemia   . Anxiety   . Arthritis   . Asthma   . Carpal tunnel syndrome   . Cigarette smoker 08/25/2017  . Complication of anesthesia    in the past has had N/V, the last few surgeries she has not been  . COPD (chronic obstructive pulmonary disease) ([place])   . Easy bruising 07/10/2016  . GERD (gastroesophageal reflux disease)   . Headache   . Heart murmur 2005   never had any problems  . History of blood transfusion 2018  . History of kidney stones   . Hypertension   . Hypoglycemia   . Hypoglycemia   . Iron deficiency anemia 07/11/2016  . Neuropathy    both arms  . Normocytic anemia 07/29/2015  . Pancreatitis   . Pneumonia   . PONV (postoperative nausea and vomiting)   . Postoperative anemia due to acute blood loss 11/15/2015  . Sciatica   . Seizures ([place])    as a child when she would have an asthma attack. none as an adult  . Shortness of breath dyspnea     SURGICAL HISTORY: Past Surgical History:  Procedure Laterality Date  . ABDOMINAL HYSTERECTOMY  2005  . APPLICATION OF ROBOTIC ASSISTANCE FOR SPINAL PROCEDURE N/A 09/05/2016   Procedure: APPLICATION OF ROBOTIC ASSISTANCE FOR SPINAL PROCEDURE;  Surgeon: Consuella Lose, MD;  Location: [place] [facility];  Service: Neurosurgery;  Laterality: N/A;  . CERVICAL FUSION    . [place]  . COLONOSCOPY     unsure last  . cyst removal face    . disk repair     neck and lumbar region  . FOOT  SURGERY Bilateral    to file bones down   . HARDWARE REMOVAL N/A 09/05/2016   Procedure: HARDWARE REMOVAL AND REPLACEMENT OF LUMBAR FIVE SCREWS;  Surgeon: Consuella Lose, MD;  Location: [place] [facility];  Service: Neurosurgery;  Laterality: N/A;  . IMPLANTATION / PLACEMENT EPIDURAL NEUROSTIMULATOR ELECTRODES    . LAMINECTOMY     2006  . LUMBAR FUSION    . SPINAL CORD STIMULATOR BATTERY EXCHANGE    . SPINAL CORD STIMULATOR INSERTION N/A 07/05/2016   Procedure: REPLACEMENT OF LUMBAR SPINAL CORD STIMULATOR BATTERY;  Surgeon: Newman Pies, MD;  Location: [facility];  Service: Neurosurgery;  Laterality: N/A;  . TONSILLECTOMY      SOCIAL HISTORY: Social History   Socioeconomic History  . Marital status: Married    Spouse name: Not on file  . Number of children: Not on file  . Years of education: Not on file  . Highest education level: Not on file  Occupational History  . Not on file  Tobacco Use  . Smoking status: Current Every Day Smoker    Packs/day: 2.00    Years: 35.00    Pack years: 70.00    Types: Cigarettes  . Smokeless tobacco: Never Used  . Tobacco comment: trying to quit  Vaping Use  . Vaping Use: Never used  Substance and Sexual Activity  . Alcohol use: Yes    Alcohol/week: 2.0 standard drinks    Types: 2 Glasses of wine per week    Comment: occasionally  . Drug use: No  . Sexual activity: Not on file  Other Topics Concern  . Not on file  Social History Narrative  . Not on file   Social Determinants of Health   Financial Resource Strain: Not on file  Food Insecurity: Not on file  Transportation Needs: Not on file  Physical Activity: Not on file  Stress: Not on file  Social Connections: Not on file  Intimate Partner Violence: Not on file    FAMILY HISTORY: Family History  Problem Relation Age of Onset  . Heart disease Mother   . Diabetes Mother   . Hypertension Mother   . Hyperlipidemia Mother   . COPD Mother        smoked  . Cancer Mother        bile duct  . COPD Father   . Diabetes Father   . Heart disease Father   . Cancer Father        throat  . Dementia Father   . Asthma Son   . Hypertension Sister   . Hyperlipidemia Sister   . Hypertension Brother   . Hyperlipidemia Brother   . Hypertension Brother   . Hyperlipidemia Brother   . Emphysema Maternal Grandmother        smoked  . Colon cancer Neg Hx   . Colon polyps Neg Hx     Review of Systems  Constitutional: Negative for fatigue.  All other systems reviewed and are negative.   Vital signs: -Deferred due  to telephone visit  Physical Exam -Deferred due to telephone visit   LABORATORY DATA:  CBC    Component Value Date/Time   WBC 4.9 02/06/2020 1433   RBC 3.62 (L) 02/06/2020 1433   HGB 11.9 (L) 02/06/2020 1433   HGB 12.4 07/26/2017 1636   HCT 38.4 02/06/2020 1433   HCT 37.8 07/26/2017 1636   PLT 219 02/06/2020 1433   PLT 351 07/26/2017 1636   MCV 106.1 (H) 02/06/2020 1433   MCV 100 (  H) 07/26/2017 1636   MCH 32.9 02/06/2020 1433   MCHC 31.0 02/06/2020 1433   RDW 13.4 02/06/2020 1433   RDW 15.3 07/26/2017 1636   LYMPHSABS 1.5 02/06/2020 1433   LYMPHSABS 1.8 07/26/2017 1636   MONOABS 0.6 02/06/2020 1433   EOSABS 0.1 02/06/2020 1433   EOSABS 0.1 07/26/2017 1636   BASOSABS 0.1 02/06/2020 1433   BASOSABS 0.0 07/26/2017 1636    CMP     Component Value Date/Time   NA 138 02/06/2020 1433   NA 142 10/29/2017 1342   K 4.0 02/06/2020 1433   CL 101 02/06/2020 1433   CO2 22 02/06/2020 1433   GLUCOSE 90 02/06/2020 1433   BUN 9 02/06/2020 1433   BUN 11 10/29/2017 1342   CREATININE 0.47 02/06/2020 1433   CALCIUM 9.4 02/06/2020 1433   PROT 7.2 02/06/2020 1433   PROT 6.6 10/29/2017 1342   ALBUMIN 4.1 02/06/2020 1433   ALBUMIN 4.2 10/29/2017 1342   AST 43 (H) 02/06/2020 1433   ALT 35 02/06/2020 1433   ALKPHOS 64 02/06/2020 1433   BILITOT 0.5 02/06/2020 1433   BILITOT 0.3 10/29/2017 1342   GFRNONAA >60 02/06/2020 1433   GFRAA >60 07/29/2019 1410   All questions were answered to patient's stated satisfaction. Encouraged patient to call with any new concerns or questions before his next visit to the cancer center and we can certain see him sooner, if needed.   Reviewed labs, ferritin of 25, Hb of 11.9, B 12 3275, vit D 45  ASSESSMENT and THERAPY PLAN:   No problem-specific Assessment & Plan notes found for this encounter. 1.  Iron deficiency anemia: -Is due to relative iron deficiency and poor diet. -She was last treated with IV iron on 07/20/2016. -She has switched to oral  iron which she is tolerating very well. -Labs done 02/2020 showed hemoglobin 11.9 and ferritin 25 -Patient reports she is feeling well, no complaints. -She denies any bright red bleeding per rectum or melena. - I have suggested to increase oral iron to BID and repeat labs in 3 months and RTC for FU with Dr Delton Coombes.  She mentioned she is working with her PCP for age appropriate cancer screening  No orders of the defined types were placed in this encounter.   All questions were answered. The patient knows to call the clinic with any problems, questions or concerns. We can certainly see the patient much sooner if necessary.   Benay Pike, MD 02/13/2020

## 2020-02-19 ENCOUNTER — Ambulatory Visit (INDEPENDENT_AMBULATORY_CARE_PROVIDER_SITE_OTHER): Payer: Medicare Other | Admitting: Internal Medicine

## 2020-02-19 ENCOUNTER — Telehealth: Payer: Self-pay | Admitting: Internal Medicine

## 2020-02-19 ENCOUNTER — Other Ambulatory Visit: Payer: Self-pay

## 2020-02-19 ENCOUNTER — Encounter: Payer: Self-pay | Admitting: Internal Medicine

## 2020-02-19 DIAGNOSIS — F17210 Nicotine dependence, cigarettes, uncomplicated: Secondary | ICD-10-CM | POA: Diagnosis not present

## 2020-02-19 DIAGNOSIS — R058 Other specified cough: Secondary | ICD-10-CM | POA: Insufficient documentation

## 2020-02-19 DIAGNOSIS — J449 Chronic obstructive pulmonary disease, unspecified: Secondary | ICD-10-CM

## 2020-02-19 DIAGNOSIS — J9611 Chronic respiratory failure with hypoxia: Secondary | ICD-10-CM

## 2020-02-19 NOTE — Patient Instructions (Addendum)
Protonix 40 mg Take 30- 60 min before your first and last meals of the day   GERD (REFLUX)  is an extremely common cause of respiratory symptoms just like yours , many times with no obvious heartburn at all.    It can be treated with medication, but also with lifestyle changes including elevation of the head of your bed (ideally with 6 -8inch blocks under the headboard of your bed),  Smoking cessation, avoidance of late meals, excessive alcohol, and avoid fatty foods, chocolate, peppermint, colas, red wine, and acidic juices such as orange juice.  NO MINT OR MENTHOL PRODUCTS SO NO COUGH DROPS  USE SUGARLESS CANDY INSTEAD (Jolley ranchers or Stover's or Life Savers) or even ice chips will also do - the key is to swallow to prevent all throat clearing. NO OIL BASED VITAMINS - use powdered substitutes.  Avoid fish oil when coughing.  We will be scheduling you ONO on room air to qualify you for 02   Please remember to go to the  x-ray department  @  [facility] for your tests - we will call you with the results when they are available     Please schedule a follow up visit in 6  months but call sooner if needed   .

## 2020-02-19 NOTE — Assessment & Plan Note (Signed)
Active smoker 08/24/2017  > try spiriva respimat 2.5  X 2 puffs each am  - PFT's  09/21/2017  FEV1 1.61 (72 % ) ratio 63  p 14 % improvement from saba p nothing prior to study with DLCO  55 % corrects to 62 % for alv volume   - alpha one screen 09/21/2017  MM level 191  -08/05/2019  After extensive coaching inhaler device,  effectiveness =    75% (short Ti)    Group D in terms of symptom/risk and laba/lama/ICS  therefore appropriate rx at this point >>>  Continue spiriva/ symbicort  I spent extra time with pt today reviewing appropriate use of albuterol for prn use on exertion with the following points: 1) saba is for relief of sob that does not improve by walking a slower pace or resting but rather if the pt does not improve after trying this first. 2) If the pt is convinced, as many are, that saba helps recover from activity faster then it's easy to tell if this is the case by re-challenging : ie stop, take the inhaler, then p 5 minutes try the exact same activity (intensity of workload) that just caused the symptoms and see if they are substantially diminished or not after saba 3) if there is an activity that reproducibly causes the symptoms, try the saba 15 min before the activity on alternate days   If in fact the saba really does help, then fine to continue to use it prn but advised may need to look closer at the maintenance regimen being used to achieve better control of airways disease with exertion.

## 2020-02-19 NOTE — Progress Notes (Signed)
Subjective:     Patient ID: Jasmine [name], female   DOB: Nov 27, 1955    MRN: [id number]    Brief patient profile: [id number]   active smoker/MM  dx with asthma as child but by HS outgrew it until her 24's with flares but better between until early 2000s and maintain on multliple inhalers and then 02/2016 hosp in [place] then [facility] April 2019 and newly started on 02 = 2lpm as needed daytime and hs and referred to pulmonary clinic 08/24/2017 by Dr   Danice [name] with GOLD II spirometry documented 09/21/17     Admit date: 06/30/2017 Discharge date: 07/09/2017    Recommendations for Outpatient Follow-up:  1. pls refer to sleep medicine for osa testing/epworht/stopbang 2. pls continue cessation counseling for Tob 3. Needs cxr 1 mo 4. Consider Megace/chantix? 5. Recheck LFT and Magnesium in 1-2 weeks---has elevated LFT unknown sig [?CVC liver]  Discharge Diagnoses:  Principal Problem:   Acute respiratory failure with hypoxia (HCC) Active Problems:   Chronic back pain   HTN (hypertension)   Chronic narcotic use   Hypokalemia   COPD with acute exacerbation (HCC)   Malnutrition of moderate degree   Discharge Condition: please refert  Diet recommendation: eat anything to gain weight for now       [facility] Weights   06/30/17 2122 07/01/17 0500 07/02/17 0412  Weight: 47.2 kg (104 lb 0.9 oz) 47.2 kg (104 lb 0.9 oz) 48.1 kg (106 lb 0.7 oz)    History of present illness:  Please see note from Jasmine [name] "64 y.o.femalewith a history of COPD, GERD, neuropathy, chronic pain on chronic narcotics, hypertension. Patient presents with worsening shortness of breathover the past 3 to 4 weeks. She presented to ED with acute respiratory distress with hypoxia and subsequently started on bipap"    Hospital Course:  1. Acute respiratory failure with hypoxia-patient says that she responded very well to nightly BiPAP. She was able to sleep for the first time in a very long time.  ?OSA-referrinf to Sleep as OP.  Needs screening fro pulm htn as well? Could benefit from Cardio-pulm rehab if avail  [place] 2. COPD with acute exacerbation- continue current treatments with IV steroids, antibiotics and continuous around-the-clock neb treatments-narrowed and tapered on d/c home--5 day steroid taper 3. Essential Hypertension-stable. 4. Hypokalemia-replacement ordered. recheck mag as op 5. Chronic pain with opioid dependence-resume home pain med management regimen which has been stable per patient.        08/24/2017 1st [facility] office visit/ Jasmine [name]   Chief Complaint  Patient presents with  . Pulmonary Consult    Referred by Jasmine [name].  Pt states dxed with COPD approx 10 years ago. She states she was hospitalized in April 2019 for COPD flare and sent home with o2.  She states she gets SOB with minimal exertion such as walking from room to room at home. She uses proair 3 x per wk on average and has duoneb but has not needed it since starting o2.    MMRC3 = can't walk 100 yards even at a slow pace at a flat grade s stopping due to sob  Even on 02  Rattling cough more prod first thing in am = min mucoid  maint on symbicort 160    Off prednisone x sev weeks and about the same since stopped just on symbicort 160 2bid  And   2lpm with activity  Sleeping  2lpm and 30 degrees    rec Plan  A = Automatic =   Symbicort 160  Take 2 puffs first thing in am and then another 2 puffs about 12 hours later and spiriva 2 pffs each am  Work on inhaler technique:     Plan B = Backup Only use your albuterol (Proair)as a rescue medication Plan C = Crisis - only use your albuterol nebulizer if you first try Plan B      08/05/2019  f/u ov/Jasmine [name] re:  GOLD II/ symb /spiriva still smoking  - had Moderna vaccine  Chief Complaint  Patient presents with  . Follow-up    Breathing has been worse over the past 2 months- she is waking up in the morning feeling SOB and has to use  her rescue inhaler. She is using the rescue inhaler most days.   Dyspnea: foot slows her down> sob  Cough: minimal in am / white  Sleeping: on 02 flat bed  SABA use: not using symb first thing as rec and uses saba first  02: 2lpm hs only / wants portable system rec Plan A = Automatic = Always=    symbicort then spiriva first thing then symbicot 12 hours later  Work on inhaler technique:  Plan B = Backup (to supplement plan A, not to replace it) Only use your albuterol inhaler as a rescue medication Plan C = Crisis (instead of Plan B but only if Plan B stops working) - only use your albuterol nebulizer if you first try Plan B and it fails to help > ok to use the nebulizer up to every 4 hours but if start needing it regularly call for immediate appointment The key is to stop smoking completely before smoking completely stops you!       02/19/2020  f/u ov/[place] office/Jasmine [name] re: GOLD II/ 02 dep still smoking Chief Complaint  Patient presents with  . Follow-up    Here for o2 recert- c/o waking up in the night coughing and "gagging" x 3 months. Cough is occ prod with clear sputum. She is using her albuterol inhaler about once per day.   Dyspnea: foot drop limiting / around the house doe  Cough: nightly cough/ gag 4 h after hs  and continues until gets up and uses saba in am / min clear mucus  Sleeping: bed is flat with wedge  SABA use: as above/ no neb  02: 2lpm hs  On ppi with breakfast    No obvious day to day or daytime variability or assoc excess/ purulent sputum or mucus plugs or hemoptysis or cp or chest tightness, subjective wheeze or overt sinus or hb symptoms.     Also denies any obvious fluctuation of symptoms with weather or environmental changes or other aggravating or alleviating factors except as outlined above   No unusual exposure hx or h/o childhood pna/ asthma or knowledge of premature birth.  Current Allergies, Complete Past Medical History, Past Surgical  History, Family History, and Social History were reviewed in [employer] record.  ROS  The following are not active complaints unless bolded Hoarseness, sore throat, dysphagia, dental problems, itching, sneezing,  nasal congestion or discharge of excess mucus or purulent secretions, ear ache,   fever, chills, sweats, unintended wt loss or wt gain, classically pleuritic or exertional cp,  orthopnea pnd or arm/hand swelling  or leg swelling, presyncope, palpitations, abdominal pain, anorexia, nausea, vomiting, diarrhea  or change in bowel habits or change in bladder habits, change in stools or change in urine, dysuria, hematuria,  rash, arthralgias, visual complaints, headache, numbness, weakness or ataxia or problems with walking or coordination,  change in mood or  memory.        Current Meds  Medication Sig  . acetaminophen (TYLENOL) 325 MG tablet Take 2 tablets (650 mg total) by mouth every 6 (six) hours as needed for mild pain, fever or headache (or Fever >/= 101).  Marland Kitchen albuterol (PROAIR HFA) 108 (90 Base) MCG/ACT inhaler USE 2 INHALATIONS EVERY 6 HOURS AS NEEDED FOR WHEEZING OR SHORTNESS OF BREATH (NEED TO BE SEEN BEFORE NEXT REFILL)  . amLODipine (NORVASC) 10 MG tablet Take 1 tablet (10 mg total) by mouth daily.  Marland Kitchen atorvastatin (LIPITOR) 40 MG tablet Take 1 tablet (40 mg total) by mouth daily.  . budesonide-formoterol (SYMBICORT) 160-4.5 MCG/ACT inhaler USE 2 INHALATIONS TWICE A DAY  . cetirizine (ZYRTEC) 10 MG tablet Take 10 mg by mouth daily as needed for allergies. Reported on 03/12/2015  . Cholecalciferol 125 MCG (5000 UT) capsule Take 5,000 Units daily by mouth.  . diclofenac sodium (VOLTAREN) 1 % GEL Apply 1 application topically daily as needed for muscle pain.  . DULoxetine (CYMBALTA) 30 MG capsule Take 3 capsules (90 mg total) by mouth daily.  . feeding supplement, ENSURE ENLIVE, (ENSURE ENLIVE) LIQD Take 237 mLs by mouth 2 (two) times daily between meals.  Marland Kitchen  guaiFENesin (MUCINEX) 600 MG 12 hr tablet Take 600 mg by mouth 2 (two) times daily.  Marland Kitchen HYDROcodone Bitartrate ER 60 MG T24A Take 1 tablet by mouth daily.  Marland Kitchen HYDROcodone-acetaminophen (NORCO) 7.5-325 MG tablet Take 1 tablet by mouth 4 (four) times daily.  Marland Kitchen ipratropium-albuterol (DUONEB) 0.5-2.5 (3) MG/3ML SOLN Inhale 3 mLs into the lungs 4 (four) times daily as needed (for shortness of breath/wheezing). *May use as needed  . L-Methylfolate-Algae-B12-B6 (METANX) 3-90.314-2-35 MG CAPS TAKE 1 TABLET BY MOUTH TWO TIMES A DAY  . magnesium oxide (MAG-OX) 400 (241.3 Mg) MG tablet Take 1 tablet by mouth 2 (two) times daily.  . magnesium oxide (MAG-OX) 400 MG tablet Take 400 mg by mouth 2 (two) times daily.  . Omega-3 Fatty Acids (FISH OIL) 1000 MG CAPS Take 1 capsule by mouth daily.   . ondansetron (ZOFRAN-ODT) 8 MG disintegrating tablet Take 8 mg by mouth 3 (three) times daily.  . OXYGEN 2lpm with sleep and as needed during the day  . pantoprazole (PROTONIX) 40 MG tablet Take 1 tablet (40 mg total) by mouth daily.  Marland Kitchen POLY-IRON 150 FORTE 150-25-1 MG-MCG-MG CAPS Take 1 capsule by mouth once daily  . Tiotropium Bromide Monohydrate (SPIRIVA RESPIMAT) 2.5 MCG/ACT AERS Inhale 2 puffs into the lungs daily.  . tizanidine (ZANAFLEX) 6 MG capsule Take 1 capsule 3 (three) times daily by mouth.  . traZODone (DESYREL) 150 MG tablet TAKE 1 TO 2 TABLETS AT BEDTIME                   Objective:   Physical Exam   thin amb wf using rollator   02/19/2020       96  08/05/2019           98.6  01/09/2018        105  09/21/2017        104   08/24/17 105 lb 9.6 oz (47.9 kg)  07/31/17 106 lb (48.1 kg)  07/26/17 105 lb 3.2 oz (47.7 kg)     Vital signs reviewed  02/19/2020  - Note at rest 02 sats  94% on RA  HEENT : pt wearing mask not removed for exam due to covid -19 concerns.    NECK :  without JVD/Nodes/TM/ nl carotid upstrokes bilaterally   LUNGS: no acc muscle use,  Mod barrel  contour chest wall  with bilateral  Distant bs s audible wheeze and  without cough on insp or exp maneuvers and mod  Hyperresonant  to  percussion bilaterally     CV:  RRR  no s3 or murmur or increase in P2, and no edema   ABD:  soft and nontender with pos mid insp Hoover's  in the supine position. No bruits or organomegaly appreciated, bowel sounds nl  MS:     ext warm without deformities, calf tenderness, cyanosis or clubbing No obvious joint restrictions   SKIN: warm and dry without lesions    NEURO:  alert, approp, nl sensorium with R Foot drop, wearing brace, slow gait with rollator            CXR PA and Lateral:   02/19/2020 :    I personally reviewed images and agree with radiology impression as follows:   Did not go for cxr as rec        Assessment:

## 2020-02-19 NOTE — Telephone Encounter (Signed)
The ONO order MW placed for this patient needs to include the directions on how the ONO is to be administered.  (on room air, etc).  Will you please update the order or create a new order so the DME can accept & full this order?  Thank you,  [facility]

## 2020-02-19 NOTE — Assessment & Plan Note (Addendum)
Counseled re importance of smoking cessation but did not meet time criteria for separate billing   °

## 2020-02-19 NOTE — Telephone Encounter (Signed)
New order placed with requested information.  Nothing further needed.  Routing back to PCCs as FYI. Thanks!

## 2020-02-19 NOTE — Assessment & Plan Note (Addendum)
Onset 11/2019 with gagging 4h p hs  - rec max gerd rx 02/19/2020   rec cxr but did not go as requested   Each maintenance medication was reviewed in detail including emphasizing most importantly the difference between maintenance and prns and under what circumstances the prns are to be triggered using an action plan format where appropriate.  Total time for H and P, chart review, counseling,  directly observing portions of ambulatory 02 saturation study/  and generating customized AVS unique to this office visit / charting > 30 min

## 2020-02-19 NOTE — Assessment & Plan Note (Signed)
As of 08/24/2017  DME- [facility]  o2 2lpm with sleep and as needed daytime -  12/02/2018 ambulatory O2 walk test with no desaturations.   -  08/05/2019   Walked RA  approx   200 ft  @ slow pace  stopped due to  Sob/ no desats    -  02/19/2020   Walked RA  approx  239ft  @ slow pace  stopped due to  Sob/ sats still 94%    - ONO RA ordered  No role for amb 02 at this point - she is way more limited by sob and deconditioning/ foot drop from reaching high levels of 02 demand thatwould be required to desaturate with highest level of activity

## 2020-02-20 ENCOUNTER — Encounter (HOSPITAL_COMMUNITY): Payer: Self-pay

## 2020-02-20 ENCOUNTER — Other Ambulatory Visit: Payer: Self-pay

## 2020-02-20 ENCOUNTER — Ambulatory Visit (HOSPITAL_COMMUNITY)
Admission: RE | Admit: 2020-02-20 | Discharge: 2020-02-20 | Disposition: A | Discharge status: Home / Self Care | Payer: Medicare Other | Source: Ambulatory Visit | Attending: Internal Medicine | Admitting: Internal Medicine

## 2020-02-20 DIAGNOSIS — J449 Chronic obstructive pulmonary disease, unspecified: Secondary | ICD-10-CM

## 2020-02-23 ENCOUNTER — Other Ambulatory Visit: Payer: Self-pay

## 2020-02-23 ENCOUNTER — Ambulatory Visit (HOSPITAL_COMMUNITY)
Admission: RE | Admit: 2020-02-23 | Discharge: 2020-02-23 | Disposition: A | Discharge status: Home / Self Care | Payer: Medicare Other | Source: Ambulatory Visit | Attending: Internal Medicine | Admitting: Internal Medicine

## 2020-02-23 DIAGNOSIS — J449 Chronic obstructive pulmonary disease, unspecified: Secondary | ICD-10-CM | POA: Diagnosis present

## 2020-02-24 ENCOUNTER — Telehealth: Payer: Self-pay | Admitting: Internal Medicine

## 2020-02-24 ENCOUNTER — Encounter: Payer: Self-pay | Admitting: Internal Medicine

## 2020-02-24 DIAGNOSIS — R918 Other nonspecific abnormal finding of lung field: Secondary | ICD-10-CM | POA: Insufficient documentation

## 2020-02-24 NOTE — Telephone Encounter (Signed)
Please see other phone note dated today  Pt given results and CT ordered

## 2020-02-24 NOTE — Telephone Encounter (Signed)
Call pt: Reviewed cxr and finding suggest partial collapse of one her lobes but the study is not definitive and ideally needs to go ahead with CT chest with contrast dx pulmonary infiltrates   Spoke with the pt and notified of results/recs  She verbalized understanding and ok to proceed with CT with contrast  I have placed order  cmet done on 02/06/20

## 2020-02-24 NOTE — Telephone Encounter (Signed)
Called [place] radiology and spoke with Malachy Mood  She is calling to bring pt's cxr to Dr Gustavus Bryant attn  Pt had cxr 02/24/20 and impression states:   IMPRESSION: COPD changes with RIGHT lower lobe atelectasis versus infiltrate.  New 14 x 10 mm nodular density in the RIGHT mid lung; CT chest with contrast recommended for further evaluation.  These results will be called to the ordering clinician or representative by the Radiologist Assistant, and communication documented in the PACS or [employer].   Electronically Signed   By: Lavonia Dana M.D.   On: 02/24/2020 12:39  Forwarding to Dr Melvyn Novas marked as urgent

## 2020-02-24 NOTE — Telephone Encounter (Signed)
lmtcb for pt.     Call pt: Reviewed cxr and finding suggest partial collapse of one her lobes but the study is not definitive and ideally needs to go ahead with CT chest with contrast dx pulmonary infiltrates

## 2020-02-24 NOTE — Telephone Encounter (Signed)
See result note.  

## 2020-03-10 ENCOUNTER — Other Ambulatory Visit: Payer: Self-pay

## 2020-03-10 ENCOUNTER — Ambulatory Visit (INDEPENDENT_AMBULATORY_CARE_PROVIDER_SITE_OTHER): Payer: Medicare Other | Admitting: Family Medicine

## 2020-03-10 VITALS — BP 136/76 | HR 94 | Temp 91.0°F | Ht 62.0 in | Wt 98.0 lb

## 2020-03-10 DIAGNOSIS — I10 Essential (primary) hypertension: Secondary | ICD-10-CM

## 2020-03-10 DIAGNOSIS — Z23 Encounter for immunization: Secondary | ICD-10-CM | POA: Diagnosis not present

## 2020-03-10 DIAGNOSIS — J9611 Chronic respiratory failure with hypoxia: Secondary | ICD-10-CM

## 2020-03-10 DIAGNOSIS — Z1211 Encounter for screening for malignant neoplasm of colon: Secondary | ICD-10-CM

## 2020-03-10 DIAGNOSIS — J432 Centrilobular emphysema: Secondary | ICD-10-CM | POA: Diagnosis not present

## 2020-03-10 DIAGNOSIS — Z780 Asymptomatic menopausal state: Secondary | ICD-10-CM

## 2020-03-10 NOTE — Progress Notes (Signed)
Subjective: CC: Follow-up chronic respiratory failure PCP: Janora Norlander, DO PJK:DTOIZT C Jasmine [name] is a 65 y.o. female presenting to clinic today for:  1.  Chronic respiratory failure with centrilobular emphysema Patient has ongoing respiratory issues and in fact over the last 3 to 4 months she has been having much more difficulty with nighttime symptoms.  She was evaluated by her pulmonologist who thought that this may be related to GERD and increased her PPI but also went ahead and obtained a chest x-ray.  According to her there is some changes on her chest x-ray that is concerning for possible collapsed lung and therefore they are going to proceed with CT scan on the 14th of the month.  She continues to use oxygen at nighttime and or when she lies down only.  She feels stable from a breathing standpoint during the daytime.  2.  Hypertension Patient is now compliant with her blood pressure medicine.  She has been monitoring her blood pressures closely and notes that if she misses a day she does have spike in blood pressure.  No reports of chest pain but she reports some shortness of breath with lying down as above  3. Preventative care Patient notes that she has never had a colonoscopy would like to proceed with 1 She needs to get mammogram scheduled She also is due for bone density and would like to have that arranged.  She will take her flu shot today but would like to get the rest of her vaccinations at [facility] and is asking for a copy of her immunization record ROS: Per HPI  Allergies  Allergen Reactions  . Lyrica [Pregabalin] Other (See Comments)    EXTREME SHAKING/TREMBLING  . Darvon [Propoxyphene]     UNSPECIFIED REACTION   . Gabapentin     Extreme shaking and trembling   . Augmentin [Amoxicillin-Pot Clavulanate] Nausea And Vomiting    Has patient had a PCN reaction causing immediate rash, facial/tongue/throat swelling, SOB or lightheadedness with hypotension:no Has  patient had a PCN reaction causing severe rash involving mucus membranes or skin necrosis:No Has patient had a PCN reaction that required hospitalization:No Has patient had a PCN reaction occurring within the last 10 years:Yes--ONLY N/V If all of the above answers are "NO", then may proceed with Cephalosporin use.   . Erythromycin Itching and Rash       . Vibramycin [Doxycycline Calcium] Itching and Rash    "[place] "   Past Medical History:  Diagnosis Date  . Allergy   . Anemia   . Anxiety   . Arthritis   . Asthma   . Carpal tunnel syndrome   . Cigarette smoker 08/25/2017  . Complication of anesthesia    in the past has had N/V, the last few surgeries she has not been  . COPD (chronic obstructive pulmonary disease) ([place])   . Easy bruising 07/10/2016  . GERD (gastroesophageal reflux disease)   . Headache   . Heart murmur 2005   never had any problems  . History of blood transfusion 2018  . History of kidney stones   . Hypertension   . Hypoglycemia   . Hypoglycemia   . Iron deficiency anemia 07/11/2016  . Neuropathy    both arms  . Normocytic anemia 07/29/2015  . Pancreatitis   . Pneumonia   . PONV (postoperative nausea and vomiting)   . Postoperative anemia due to acute blood loss 11/15/2015  . Sciatica   . Seizures ([employer])    as  a child when she would have an asthma attack. none as an adult  . Shortness of breath dyspnea     Current Outpatient Medications:  .  acetaminophen (TYLENOL) 325 MG tablet, Take 2 tablets (650 mg total) by mouth every 6 (six) hours as needed for mild pain, fever or headache (or Fever >/= 101)., Disp: 12 tablet, Rfl: 2 .  albuterol (PROAIR HFA) 108 (90 Base) MCG/ACT inhaler, USE 2 INHALATIONS EVERY 6 HOURS AS NEEDED FOR WHEEZING OR SHORTNESS OF BREATH (NEED TO BE SEEN BEFORE NEXT REFILL), Disp: 25.5 g, Rfl: 1 .  amLODipine (NORVASC) 10 MG tablet, Take 1 tablet (10 mg total) by mouth daily., Disp: 90 tablet, Rfl: 1 .  atorvastatin (LIPITOR) 40 MG  tablet, Take 1 tablet (40 mg total) by mouth daily., Disp: 90 tablet, Rfl: 1 .  budesonide-formoterol (SYMBICORT) 160-4.5 MCG/ACT inhaler, USE 2 INHALATIONS TWICE A DAY, Disp: 20.4 g, Rfl: 0 .  cetirizine (ZYRTEC) 10 MG tablet, Take 10 mg by mouth daily as needed for allergies. Reported on 03/12/2015, Disp: , Rfl:  .  Cholecalciferol 125 MCG (5000 UT) capsule, Take 5,000 Units daily by mouth., Disp: , Rfl:  .  diclofenac sodium (VOLTAREN) 1 % GEL, Apply 1 application topically daily as needed for muscle pain., Disp: , Rfl:  .  DULoxetine (CYMBALTA) 30 MG capsule, Take 3 capsules (90 mg total) by mouth daily., Disp: 270 capsule, Rfl: 4 .  feeding supplement, ENSURE ENLIVE, (ENSURE ENLIVE) LIQD, Take 237 mLs by mouth 2 (two) times daily between meals., Disp: 237 mL, Rfl: 12 .  guaiFENesin (MUCINEX) 600 MG 12 hr tablet, Take 600 mg by mouth 2 (two) times daily., Disp: , Rfl:  .  HYDROcodone Bitartrate ER 60 MG T24A, Take 1 tablet by mouth daily., Disp: , Rfl:  .  HYDROcodone-acetaminophen (NORCO) 7.5-325 MG tablet, Take 1 tablet by mouth 4 (four) times daily., Disp: , Rfl:  .  ipratropium-albuterol (DUONEB) 0.5-2.5 (3) MG/3ML SOLN, Inhale 3 mLs into the lungs 4 (four) times daily as needed (for shortness of breath/wheezing). *May use as needed, Disp: , Rfl:  .  L-Methylfolate-Algae-B12-B6 (METANX) 3-90.314-2-35 MG CAPS, TAKE 1 TABLET BY MOUTH TWO TIMES A DAY, Disp: 180 capsule, Rfl: 4 .  magnesium oxide (MAG-OX) 400 (241.3 Mg) MG tablet, Take 1 tablet by mouth 2 (two) times daily., Disp: , Rfl:  .  magnesium oxide (MAG-OX) 400 MG tablet, Take 400 mg by mouth 2 (two) times daily., Disp: , Rfl:  .  Omega-3 Fatty Acids (FISH OIL) 1000 MG CAPS, Take 1 capsule by mouth daily. , Disp: , Rfl:  .  ondansetron (ZOFRAN-ODT) 8 MG disintegrating tablet, Take 8 mg by mouth 3 (three) times daily., Disp: , Rfl:  .  OXYGEN, 2lpm with sleep and as needed during the day, Disp: , Rfl:  .  pantoprazole (PROTONIX) 40 MG  tablet, Take 1 tablet (40 mg total) by mouth daily., Disp: 90 tablet, Rfl: 2 .  POLY-IRON 150 FORTE 150-25-1 MG-MCG-MG CAPS, Take 1 capsule by mouth once daily, Disp: 30 capsule, Rfl: 0 .  Tiotropium Bromide Monohydrate (SPIRIVA RESPIMAT) 2.5 MCG/ACT AERS, Inhale 2 puffs into the lungs daily., Disp: 12 g, Rfl: 3 .  tizanidine (ZANAFLEX) 6 MG capsule, Take 1 capsule 3 (three) times daily by mouth., Disp: , Rfl:  .  traZODone (DESYREL) 150 MG tablet, TAKE 1 TO 2 TABLETS AT BEDTIME, Disp: 180 tablet, Rfl: 3 Social History   Socioeconomic History  . Marital status: Married  Spouse name: Not on file  . Number of children: Not on file  . Years of education: Not on file  . Highest education level: Not on file  Occupational History  . Not on file  Tobacco Use  . Smoking status: Current Every Day Smoker    Packs/day: 2.00    Years: 35.00    Pack years: 70.00    Types: Cigarettes  . Smokeless tobacco: Never Used  . Tobacco comment: trying to quit  Vaping Use  . Vaping Use: Never used  Substance and Sexual Activity  . Alcohol use: Yes    Alcohol/week: 2.0 standard drinks    Types: 2 Glasses of wine per week    Comment: occasionally  . Drug use: No  . Sexual activity: Not on file  Other Topics Concern  . Not on file  Social History Narrative  . Not on file   Social Determinants of Health   Financial Resource Strain: Not on file  Food Insecurity: Not on file  Transportation Needs: Not on file  Physical Activity: Not on file  Stress: Not on file  Social Connections: Not on file  Intimate Partner Violence: Not on file   Family History  Problem Relation Age of Onset  . Heart disease Mother   . Diabetes Mother   . Hypertension Mother   . Hyperlipidemia Mother   . COPD Mother        smoked  . Cancer Mother        bile duct  . COPD Father   . Diabetes Father   . Heart disease Father   . Cancer Father        throat  . Dementia Father   . Asthma Son   . Hypertension  Sister   . Hyperlipidemia Sister   . Hypertension Brother   . Hyperlipidemia Brother   . Hypertension Brother   . Hyperlipidemia Brother   . Emphysema Maternal Grandmother        smoked  . Colon cancer Neg Hx   . Colon polyps Neg Hx     Objective: Office vital signs reviewed. BP 136/76   Pulse 94   Temp (!) 91 F (32.8 C) (Temporal)   Ht 5\' 2"  (1.575 m)   Wt 98 lb (44.5 kg)   SpO2 91%   BMI 17.92 kg/m   Physical Examination:  General: Awake, alert, chronically ill-appearing female, malnourished, No acute distress Cardio: regular rate and rhythm, S1S2 heard, no murmurs appreciated Pulm: Globally decreased breath sounds with mild rhonchi noted at the right base  Assessment/ Plan: 65 y.o. female   Essential hypertension  Chronic respiratory failure with hypoxia (HCC)  Centrilobular emphysema (HCC)  Screen for colon cancer - Plan: Ambulatory referral to Gastroenterology  Asymptomatic postmenopausal estrogen deficiency - Plan: DG [facility] DEXA  Blood pressure is under good control.  She may continue current regimen.    Continue following up with pulmonology as scheduled.  I am interested to see what her CAT scan will show  Referral to GI in [place] has been placed for colon cancer screening  She will schedule mammogram and DEXA scan at checkout today.  Would like to see her back in the next 3 to 6 months for full physical exam with fasting labs.  No orders of the defined types were placed in this encounter.  No orders of the defined types were placed in this encounter.    Janora Norlander, DO [place] [phone]

## 2020-03-10 NOTE — Patient Instructions (Signed)
Schedule mammogram, bone density at check out.

## 2020-03-15 ENCOUNTER — Encounter: Payer: Self-pay | Admitting: Internal Medicine

## 2020-03-19 ENCOUNTER — Ambulatory Visit (HOSPITAL_COMMUNITY)
Admission: RE | Admit: 2020-03-19 | Discharge: 2020-03-19 | Disposition: A | Discharge status: Home / Self Care | Payer: Medicare Other | Source: Ambulatory Visit | Attending: Internal Medicine | Admitting: Internal Medicine

## 2020-03-19 ENCOUNTER — Other Ambulatory Visit: Payer: Self-pay

## 2020-03-19 DIAGNOSIS — R918 Other nonspecific abnormal finding of lung field: Secondary | ICD-10-CM

## 2020-03-19 MED ORDER — IOHEXOL 300 MG/ML  SOLN
75.0000 mL | Freq: Once | INTRAMUSCULAR | Status: AC | PRN
  Administered 2020-03-19: 75 mL via INTRAVENOUS

## 2020-03-20 LAB — POCT I-STAT CREATININE: Creatinine, Ser: 0.8 mg/dL (ref 0.44–1.00)

## 2020-03-22 ENCOUNTER — Other Ambulatory Visit: Payer: Self-pay | Admitting: Internal Medicine

## 2020-03-22 ENCOUNTER — Encounter: Payer: Self-pay | Admitting: Internal Medicine

## 2020-03-22 DIAGNOSIS — R911 Solitary pulmonary nodule: Secondary | ICD-10-CM

## 2020-03-22 NOTE — Progress Notes (Signed)
Spoke with pt and notified of results per Dr. Melvyn Novas. Pt verbalized understanding and denied any questions. She was agreeable to PET and I have placed the order.

## 2020-03-25 ENCOUNTER — Other Ambulatory Visit: Payer: Self-pay

## 2020-03-25 ENCOUNTER — Encounter (HOSPITAL_COMMUNITY): Payer: Self-pay | Admitting: Emergency Medicine

## 2020-03-25 ENCOUNTER — Emergency Department (HOSPITAL_COMMUNITY)
Admission: EM | Admit: 2020-03-25 | Discharge: 2020-03-26 | Disposition: A | Discharge status: Home / Self Care | Payer: Medicare Other | Attending: Emergency Medicine | Admitting: Emergency Medicine

## 2020-03-25 DIAGNOSIS — J441 Chronic obstructive pulmonary disease with (acute) exacerbation: Secondary | ICD-10-CM | POA: Insufficient documentation

## 2020-03-25 DIAGNOSIS — Z79899 Other long term (current) drug therapy: Secondary | ICD-10-CM | POA: Diagnosis not present

## 2020-03-25 DIAGNOSIS — R0789 Other chest pain: Secondary | ICD-10-CM | POA: Diagnosis present

## 2020-03-25 DIAGNOSIS — M546 Pain in thoracic spine: Secondary | ICD-10-CM | POA: Insufficient documentation

## 2020-03-25 DIAGNOSIS — R112 Nausea with vomiting, unspecified: Secondary | ICD-10-CM | POA: Diagnosis not present

## 2020-03-25 DIAGNOSIS — J45909 Unspecified asthma, uncomplicated: Secondary | ICD-10-CM | POA: Insufficient documentation

## 2020-03-25 DIAGNOSIS — F17210 Nicotine dependence, cigarettes, uncomplicated: Secondary | ICD-10-CM | POA: Diagnosis not present

## 2020-03-25 DIAGNOSIS — I10 Essential (primary) hypertension: Secondary | ICD-10-CM | POA: Diagnosis not present

## 2020-03-25 DIAGNOSIS — M549 Dorsalgia, unspecified: Secondary | ICD-10-CM

## 2020-03-25 DIAGNOSIS — R053 Chronic cough: Secondary | ICD-10-CM | POA: Diagnosis not present

## 2020-03-25 LAB — LIPASE, BLOOD: Lipase: 30 U/L (ref 11–51)

## 2020-03-25 LAB — COMPREHENSIVE METABOLIC PANEL
ALT: 37 U/L (ref 0–44)
AST: 43 U/L — ABNORMAL HIGH (ref 15–41)
Albumin: 3.9 g/dL (ref 3.5–5.0)
Alkaline Phosphatase: 64 U/L (ref 38–126)
Anion gap: 15 (ref 5–15)
BUN: 10 mg/dL (ref 8–23)
CO2: 26 mmol/L (ref 22–32)
Calcium: 9.3 mg/dL (ref 8.9–10.3)
Chloride: 96 mmol/L — ABNORMAL LOW (ref 98–111)
Creatinine, Ser: 0.56 mg/dL (ref 0.44–1.00)
GFR, Estimated: >60 mL/min (ref >60)
Glucose, Bld: 88 mg/dL (ref 70–99)
Potassium: 3.8 mmol/L (ref 3.5–5.1)
Sodium: 137 mmol/L (ref 135–145)
Total Bilirubin: 0.4 mg/dL (ref 0.3–1.2)
Total Protein: 7.3 g/dL (ref 6.5–8.1)

## 2020-03-25 LAB — CBC
HCT: 41.3 % (ref 36.0–46.0)
Hemoglobin: 12.6 g/dL (ref 12.0–15.0)
MCH: 32.7 pg (ref 26.0–34.0)
MCHC: 30.5 g/dL (ref 30.0–36.0)
MCV: 107.3 fL — ABNORMAL HIGH (ref 80.0–100.0)
Platelets: 247 10*3/uL (ref 150–400)
RBC: 3.85 MIL/uL — ABNORMAL LOW (ref 3.87–5.11)
RDW: 14.4 % (ref 11.5–15.5)
WBC: 6.5 10*3/uL (ref 4.0–10.5)
nRBC: 0 % (ref 0.0–0.2)

## 2020-03-25 NOTE — ED Triage Notes (Signed)
Pt c/o vomiting all day today. Unable to keep anything down. Pt also c/o some chest pain that radiates to her back.

## 2020-03-26 ENCOUNTER — Telehealth: Payer: Self-pay

## 2020-03-26 ENCOUNTER — Emergency Department (HOSPITAL_COMMUNITY): Payer: Medicare Other

## 2020-03-26 ENCOUNTER — Telehealth: Payer: Self-pay | Admitting: Internal Medicine

## 2020-03-26 DIAGNOSIS — R0789 Other chest pain: Secondary | ICD-10-CM | POA: Diagnosis not present

## 2020-03-26 LAB — URINALYSIS, ROUTINE W REFLEX MICROSCOPIC
Bilirubin Urine: NEGATIVE
Glucose, UA: NEGATIVE mg/dL
Hgb urine dipstick: NEGATIVE
Ketones, ur: 5 mg/dL — AB
Leukocytes,Ua: NEGATIVE
Nitrite: NEGATIVE
Protein, ur: NEGATIVE mg/dL
Specific Gravity, Urine: 1.034 — ABNORMAL HIGH (ref 1.005–1.030)
pH: 7 (ref 5.0–8.0)

## 2020-03-26 LAB — TROPONIN I (HIGH SENSITIVITY)
Troponin I (High Sensitivity): 6 ng/L (ref <18)
Troponin I (High Sensitivity): 7 ng/L (ref <18)

## 2020-03-26 LAB — RAPID URINE DRUG SCREEN, HOSP PERFORMED
Amphetamines: NOT DETECTED
Barbiturates: NOT DETECTED
Benzodiazepines: NOT DETECTED
Cocaine: NOT DETECTED
Opiates: POSITIVE — AB
Tetrahydrocannabinol: NOT DETECTED

## 2020-03-26 LAB — ETHANOL: Alcohol, Ethyl (B): 48 mg/dL — ABNORMAL HIGH (ref <10)

## 2020-03-26 LAB — D-DIMER, QUANTITATIVE: D-Dimer, Quant: 0.82 ug/mL-FEU — ABNORMAL HIGH (ref 0.00–0.50)

## 2020-03-26 MED ORDER — FENTANYL CITRATE (PF) 100 MCG/2ML IJ SOLN
25.0000 ug | Freq: Once | INTRAMUSCULAR | Status: AC
Start: 2020-03-26 — End: 2020-03-26
  Administered 2020-03-26: 25 ug via INTRAVENOUS
  Filled 2020-03-26: qty 2

## 2020-03-26 MED ORDER — SODIUM CHLORIDE 0.9 % IV BOLUS
500.0000 mL | Freq: Once | INTRAVENOUS | Status: AC
  Administered 2020-03-26: 500 mL via INTRAVENOUS

## 2020-03-26 MED ORDER — METHOCARBAMOL 500 MG PO TABS: ORAL_TABLET | ORAL | 0 refills | Status: DC

## 2020-03-26 MED ORDER — IOHEXOL 350 MG/ML SOLN
100.0000 mL | Freq: Once | INTRAVENOUS | Status: AC | PRN
  Administered 2020-03-26: 100 mL via INTRAVENOUS

## 2020-03-26 MED ORDER — FAMOTIDINE IN NACL 20-0.9 MG/50ML-% IV SOLN
20.0000 mg | Freq: Once | INTRAVENOUS | Status: AC
  Administered 2020-03-26: 20 mg via INTRAVENOUS
  Filled 2020-03-26: qty 50

## 2020-03-26 MED ORDER — ONDANSETRON HCL 4 MG/2ML IJ SOLN
4.0000 mg | Freq: Once | INTRAMUSCULAR | Status: AC
  Administered 2020-03-26: 4 mg via INTRAVENOUS
  Filled 2020-03-26: qty 2

## 2020-03-26 MED ORDER — SODIUM CHLORIDE 0.9 % IV BOLUS
1000.0000 mL | Freq: Once | INTRAVENOUS | Status: AC
  Administered 2020-03-26: 1000 mL via INTRAVENOUS

## 2020-03-26 NOTE — ED Provider Notes (Signed)
[facility] EMERGENCY DEPARTMENT Provider Note   CSN: [id number] Arrival date & time: 03/25/20  2018   Time seen 1:05 AM  History Chief Complaint  Patient presents with  . Emesis    Jasmine [name] is a 65 y.o. female.  HPI   Patient states when she awoke this morning, January 20 she was having central chest pain that shoots into the back between her shoulder blades.  The pain has been there steady and has been present all day.  She describes it as not sharp but aching.  Nothing she does makes it feel better nothing she does makes it feel worse.  She states she has had this pain before once in a while over the past few years but not as bad as today.  She has had nausea and vomiting today approximately 15 times without blood.  She denies abdominal pain or diarrhea.  She states she has a chronic cough and she is currently being evaluated for possible lung cancer.  Patient currently smokes a half a pack per day.  She is on oxygen when she lays down to sleep or nap and she wears 2 L/min nasal cannula.  She states she took her blood pressure at home tonight and it was elevated but she cannot recall how high and she took an extra Norvasc.  Patient has had the [place] x2  PCP Janora Norlander, DO   Past Medical History:  Diagnosis Date  . Allergy   . Anemia   . Anxiety   . Arthritis   . Asthma   . Carpal tunnel syndrome   . Cigarette smoker 08/25/2017  . Complication of anesthesia    in the past has had N/V, the last few surgeries she has not been  . COPD (chronic obstructive pulmonary disease) ([place])   . Easy bruising 07/10/2016  . GERD (gastroesophageal reflux disease)   . Headache   . Heart murmur 2005   never had any problems  . History of blood transfusion 2018  . History of kidney stones   . Hypertension   . Hypoglycemia   . Hypoglycemia   . Iron deficiency anemia 07/11/2016  . Neuropathy    both arms  . Normocytic anemia 07/29/2015  . Pancreatitis   .  Pneumonia   . PONV (postoperative nausea and vomiting)   . Postoperative anemia due to acute blood loss 11/15/2015  . Sciatica   . Seizures ([place])    as a child when she would have an asthma attack. none as an adult  . Shortness of breath dyspnea     Patient Active Problem List   Diagnosis Date Noted  . Solitary pulmonary nodule 03/22/2020  . Pulmonary infiltrate on chest x-ray 02/24/2020  . Nocturnal cough 02/19/2020  . Pancreatitis, acute 01/06/2019  . Acute pancreatitis 01/05/2019  . Cigarette smoker 08/25/2017  . Chronic respiratory failure with hypoxia ([place]) 08/25/2017  . Centrilobular emphysema ([place]) 07/09/2017  . Aortic atherosclerosis ([place]) 07/09/2017  . Hepatic steatosis 07/09/2017  . Malnutrition of moderate degree 07/02/2017  . Acute respiratory failure with hypoxia ([place]) 06/30/2017  . COPD with acute exacerbation ([place]) 06/30/2017  . 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/2016  . Tobacco abuse 08/04/2016  . Iron deficiency anemia 07/11/2016  . Easy bruising 07/10/2016  . Lumbar stenosis 11/11/2015  . Leg pain 09/24/2015  . Vitamin D deficiency 07/29/2015  . Chronic back pain 02/22/2015  . HTN (hypertension) 02/22/2015  . Chronic narcotic use 02/22/2015  . COPD GOLD  II  02/22/2015  . Headache 02/22/2015    Past Surgical History:  Procedure Laterality Date  . ABDOMINAL HYSTERECTOMY  2005  . APPLICATION OF ROBOTIC ASSISTANCE FOR SPINAL PROCEDURE N/A 09/05/2016   Procedure: APPLICATION OF ROBOTIC ASSISTANCE FOR SPINAL PROCEDURE;  Surgeon: Consuella Lose, MD;  Location: [place];  Service: Neurosurgery;  Laterality: N/A;  . CERVICAL FUSION    . [place]  . COLONOSCOPY     unsure last  . cyst removal face    . disk repair     neck and lumbar region  . FOOT SURGERY Bilateral    to file bones down   . HARDWARE REMOVAL N/A 09/05/2016   Procedure: HARDWARE REMOVAL AND REPLACEMENT OF LUMBAR FIVE SCREWS;  Surgeon: Consuella Lose, MD;  Location: [place];   Service: Neurosurgery;  Laterality: N/A;  . IMPLANTATION / PLACEMENT EPIDURAL NEUROSTIMULATOR ELECTRODES    . LAMINECTOMY     2006  . LUMBAR FUSION    . SPINAL CORD STIMULATOR BATTERY EXCHANGE    . SPINAL CORD STIMULATOR INSERTION N/A 07/05/2016   Procedure: REPLACEMENT OF LUMBAR SPINAL CORD STIMULATOR BATTERY;  Surgeon: Newman Pies, MD;  Location: [place];  Service: Neurosurgery;  Laterality: N/A;  . TONSILLECTOMY       OB History   No obstetric history on file.     Family History  Problem Relation Age of Onset  . Heart disease Mother   . Diabetes Mother   . Hypertension Mother   . Hyperlipidemia Mother   . COPD Mother        smoked  . Cancer Mother        bile duct  . COPD Father   . Diabetes Father   . Heart disease Father   . Cancer Father        throat  . Dementia Father   . Asthma Son   . Hypertension Sister   . Hyperlipidemia Sister   . Hypertension Brother   . Hyperlipidemia Brother   . Hypertension Brother   . Hyperlipidemia Brother   . Emphysema Maternal Grandmother        smoked  . Colon cancer Neg Hx   . Colon polyps Neg Hx     Social History   Tobacco Use  . Smoking status: Current Every Day Smoker    Packs/day: 2.00    Years: 35.00    Pack years: 70.00    Types: Cigarettes  . Smokeless tobacco: Never Used  . Tobacco comment: trying to quit  Vaping Use  . Vaping Use: Never used  Substance Use Topics  . Alcohol use: Yes    Alcohol/week: 2.0 standard drinks    Types: 2 Glasses of wine per week    Comment: occasionally  . Drug use: No  lives with spouse  Home Medications Prior to Admission medications   Medication Sig Start Date End Date Taking? Authorizing Provider  methocarbamol (ROBAXIN) 500 MG tablet Take 1 or 2 po Q 6hrs for pain 03/26/20  Yes Rolland Porter, MD  acetaminophen (TYLENOL) 325 MG tablet Take 2 tablets (650 mg total) by mouth every 6 (six) hours as needed for mild pain, fever or headache (or Fever >/= 101). 01/10/19   Roxan Hockey, MD  albuterol (PROAIR HFA) 108 (90 Base) MCG/ACT inhaler USE 2 INHALATIONS EVERY 6 HOURS AS NEEDED FOR WHEEZING OR SHORTNESS OF BREATH (NEED TO BE SEEN BEFORE NEXT REFILL) 09/09/19   Ronnie Doss M, DO  amLODipine (NORVASC) 10 MG  tablet Take 1 tablet (10 mg total) by mouth daily. 08/20/19   Janora Norlander, DO  atorvastatin (LIPITOR) 40 MG tablet Take 1 tablet (40 mg total) by mouth daily. 08/20/19   Ronnie Doss M, DO  budesonide-formoterol (SYMBICORT) 160-4.5 MCG/ACT inhaler USE 2 INHALATIONS TWICE A DAY 12/15/19   Ronnie Doss M, DO  cetirizine (ZYRTEC) 10 MG tablet Take 10 mg by mouth daily as needed for allergies. Reported on 03/12/2015    [provider]  Cholecalciferol 125 MCG (5000 UT) capsule Take 5,000 Units daily by mouth.    [provider]  diclofenac sodium (VOLTAREN) 1 % GEL Apply 1 application topically daily as needed for muscle pain. 06/30/16   [provider]  DULoxetine (CYMBALTA) 30 MG capsule Take 3 capsules (90 mg total) by mouth daily. 08/20/19   Janora Norlander, DO  feeding supplement, ENSURE ENLIVE, (ENSURE ENLIVE) LIQD Take 237 mLs by mouth 2 (two) times daily between meals. 07/04/17   Nita Sells, MD  guaiFENesin (MUCINEX) 600 MG 12 hr tablet Take 600 mg by mouth 2 (two) times daily.    [provider]  HYDROcodone Bitartrate ER 60 MG T24A Take 1 tablet by mouth daily.    [provider]  HYDROcodone-acetaminophen (NORCO) 7.5-325 MG tablet Take 1 tablet by mouth 4 (four) times daily.    [provider]  ipratropium-albuterol (DUONEB) 0.5-2.5 (3) MG/3ML SOLN Inhale 3 mLs into the lungs 4 (four) times daily as needed (for shortness of breath/wheezing). *May use as needed 02/21/16   [provider]  L-Methylfolate-Algae-B12-B6 [place]) 3-90.314-2-35 MG CAPS TAKE 1 TABLET BY MOUTH TWO TIMES A DAY 09/10/19   Ronnie Doss M, DO  magnesium oxide (MAG-OX) 400 (241.3 Mg) MG tablet Take  1 tablet by mouth 2 (two) times daily. 02/07/20   [provider]  magnesium oxide (MAG-OX) 400 MG tablet Take 400 mg by mouth 2 (two) times daily.    [provider]  Omega-3 Fatty Acids (FISH OIL) 1000 MG CAPS Take 1 capsule by mouth daily.     [provider]  ondansetron (ZOFRAN-ODT) 8 MG disintegrating tablet Take 8 mg by mouth 3 (three) times daily. 01/10/20   [provider]  OXYGEN 2lpm with sleep and as needed during the day    [provider]  pantoprazole (PROTONIX) 40 MG tablet Take 1 tablet (40 mg total) by mouth daily. Patient taking differently: Take 40 mg by mouth 2 (two) times daily before a meal. 09/09/19   Gottschalk, Ashly M, DO  POLY-IRON 150 FORTE 150-25-1 MG-MCG-MG CAPS Take 1 capsule by mouth once daily Patient taking differently: Take 1 capsule by mouth 2 (two) times daily. 02/11/20   Derek Jack, MD  Tiotropium Bromide Monohydrate (SPIRIVA RESPIMAT) 2.5 MCG/ACT AERS Inhale 2 puffs into the lungs daily. 09/09/19   Tanda Rockers, MD  tizanidine (ZANAFLEX) 6 MG capsule Take 1 capsule 3 (three) times daily by mouth. 11/27/16   [provider]  traZODone (DESYREL) 150 MG tablet TAKE 1 TO 2 TABLETS AT BEDTIME 02/02/20   Ronnie Doss M, DO    Allergies    Lyrica [pregabalin], Darvon [propoxyphene], Gabapentin, Augmentin [amoxicillin-pot clavulanate], Erythromycin, and Vibramycin [doxycycline calcium]  Review of Systems   Review of Systems  All other systems reviewed and are negative.   Physical Exam Updated Vital Signs BP (!) 151/83   Pulse 93   Temp 98.4 F (36.9 C) (Oral)   Resp 18   Ht 5\' 3"  (1.6  m)   Wt 44.5 kg   SpO2 93%   BMI 17.36 kg/m   Physical Exam Vitals and nursing note reviewed.  Constitutional:      General: She is not in acute distress.    Appearance: She is not ill-appearing or toxic-appearing.     Comments: Thin underweight elderly female  HENT:     Head: Normocephalic and  atraumatic.     Right Ear: External ear normal.     Left Ear: External ear normal.  Eyes:     Extraocular Movements: Extraocular movements intact.     Conjunctiva/sclera: Conjunctivae normal.     Pupils: Pupils are equal, round, and reactive to light.  Cardiovascular:     Rate and Rhythm: Normal rate and regular rhythm.     Pulses: Normal pulses.     Heart sounds: Normal heart sounds. No murmur heard.     Comments: Patient has bounding radial pulses bilaterally Pulmonary:     Effort: Pulmonary effort is normal.     Breath sounds: Normal breath sounds. Decreased air movement present.  Abdominal:     General: Abdomen is flat. Bowel sounds are normal.     Palpations: Abdomen is soft. There is no mass.  Musculoskeletal:        General: No swelling. Normal range of motion.     Cervical back: Normal range of motion.     Comments: Patient has foot drop on the right which is chronic  Skin:    General: Skin is warm and dry.  Neurological:     General: No focal deficit present.     Mental Status: She is oriented to person, place, and time.     Cranial Nerves: No cranial nerve deficit.  Psychiatric:        Mood and Affect: Mood normal.        Behavior: Behavior normal.        Thought Content: Thought content normal.     ED Results / Procedures / Treatments   Labs (all labs ordered are listed, but only abnormal results are displayed) Results for orders placed or performed during the hospital encounter of 03/25/20  Lipase, blood  Result Value Ref Range   Lipase 30 11 - 51 U/L  Comprehensive metabolic panel  Result Value Ref Range   Sodium 137 135 - 145 mmol/L   Potassium 3.8 3.5 - 5.1 mmol/L   Chloride 96 (L) 98 - 111 mmol/L   CO2 26 22 - 32 mmol/L   Glucose, Bld 88 70 - 99 mg/dL   BUN 10 8 - 23 mg/dL   Creatinine, Ser 0.56 0.44 - 1.00 mg/dL   Calcium 9.3 8.9 - 10.3 mg/dL   Total Protein 7.3 6.5 - 8.1 g/dL   Albumin 3.9 3.5 - 5.0 g/dL   AST 43 (H) 15 - 41 U/L   ALT 37 0 -  44 U/L   Alkaline Phosphatase 64 38 - 126 U/L   Total Bilirubin 0.4 0.3 - 1.2 mg/dL   GFR, Estimated >60 >60 mL/min   Anion gap 15 5 - 15  CBC  Result Value Ref Range   WBC 6.5 4.0 - 10.5 K/uL   RBC 3.85 (L) 3.87 - 5.11 MIL/uL   Hemoglobin 12.6 12.0 - 15.0 g/dL   HCT 41.3 36.0 - 46.0 %   MCV 107.3 (H) 80.0 - 100.0 fL   MCH 32.7 26.0 - 34.0 pg   MCHC 30.5 30.0 - 36.0 g/dL   RDW 14.4 11.5 -  15.5 %   Platelets 247 150 - 400 K/uL   nRBC 0.0 0.0 - 0.2 %  Urinalysis, Routine w reflex microscopic Urine, Clean Catch  Result Value Ref Range   Color, Urine YELLOW YELLOW   APPearance CLEAR CLEAR   Specific Gravity, Urine 1.034 (H) 1.005 - 1.030   pH 7.0 5.0 - 8.0   Glucose, UA NEGATIVE NEGATIVE mg/dL   Hgb urine dipstick NEGATIVE NEGATIVE   Bilirubin Urine NEGATIVE NEGATIVE   Ketones, ur 5 (A) NEGATIVE mg/dL   Protein, ur NEGATIVE NEGATIVE mg/dL   Nitrite NEGATIVE NEGATIVE   Leukocytes,Ua NEGATIVE NEGATIVE  Ethanol  Result Value Ref Range   Alcohol, Ethyl (B) 48 (H) <10 mg/dL  Urine rapid drug screen (hosp performed)  Result Value Ref Range   Opiates POSITIVE (A) NONE DETECTED   Cocaine NONE DETECTED NONE DETECTED   Benzodiazepines NONE DETECTED NONE DETECTED   Amphetamines NONE DETECTED NONE DETECTED   Tetrahydrocannabinol NONE DETECTED NONE DETECTED   Barbiturates NONE DETECTED NONE DETECTED  D-dimer, quantitative  Result Value Ref Range   D-Dimer, Quant 0.82 (H) 0.00 - 0.50 ug/mL-FEU  Troponin I (High Sensitivity)  Result Value Ref Range   Troponin I (High Sensitivity) 6 <18 ng/L  Troponin I (High Sensitivity)  Result Value Ref Range   Troponin I (High Sensitivity) 7 <18 ng/L   Laboratory interpretation all normal except minor elevation of 1 LFT, alcohol ingestion, positive UDS as expected, concentrated urinalysis     EKG EKG Interpretation  Date/Time:  Thursday March 25 2020 20:59:39 EST Ventricular Rate:  81 PR Interval:  152 QRS Duration: 74 QT  Interval:  408 QTC Calculation: 473 R Axis:   64 Text Interpretation: Normal sinus rhythm Cannot rule out Anterior infarct , age undetermined ST & T wave abnormality, consider inferior ischemia Electrode noise No significant change since last tracing 05 Jan 2020 Confirmed by Rolland Porter [phone]) on 03/26/2020 1:24:16 AM   Radiology CT Angio Chest/Abd/Pel for Dissection W and/or W/WO  Result Date: 03/26/2020 CLINICAL DATA:  Chest pain. Concern for aortic dissection. Vomiting all day. EXAM: CT ANGIOGRAPHY CHEST, ABDOMEN AND PELVIS TECHNIQUE: Non-contrast CT of the chest was initially obtained. Multidetector CT imaging through the chest, abdomen and pelvis was performed using the standard protocol during bolus administration of intravenous contrast. Multiplanar reconstructed images and MIPs were obtained and reviewed to evaluate the vascular anatomy. CONTRAST:  132mL OMNIPAQUE IOHEXOL 350 MG/ML SOLN COMPARISON:  CT dated January 05, 2019.  CT dated March 19, 2020 FINDINGS: CTA CHEST FINDINGS Cardiovascular: There is no evidence for thoracic aortic dissection. There is no evidence for thoracic aortic aneurysm. Atherosclerotic changes are noted of the thoracic aorta. The main right and left pulmonary arteries are dilated suggestive of pulmonary artery hypertension. There is no evidence for large centrally located pulmonary embolism. There is moderate stenosis at the origin of the left carotid artery. The arch vessels are patent where visualized. Mediastinum/Nodes: -- No mediastinal lymphadenopathy. -- No hilar lymphadenopathy. -- No axillary lymphadenopathy. -- No supraclavicular lymphadenopathy. -- Normal thyroid gland where visualized. -  Unremarkable esophagus. Lungs/Pleura: Severe emphysematous changes are noted. Again noted is a spiculated pulmonary nodule in the right upper lobe measuring approximately 1.5 cm, highly suspicious for bronchogenic carcinoma. There is no pneumothorax. No significant pleural  effusion. Musculoskeletal: There are old left-sided rib fractures. There is no definite acute displaced fracture identified on today's study. Review of the MIP images confirms the above findings. CTA ABDOMEN AND PELVIS FINDINGS VASCULAR Aorta:  There are atherosclerotic changes throughout the abdominal aorta without evidence for an aneurysm or dissection. Celiac: There is moderate narrowing at the origin of the celiac axis. SMA: There is mild narrowing at the origin of the SMA. Renals: There is mild-to-moderate narrowing of both renal arteries, left worse than right. IMA: Patent without evidence of aneurysm, dissection, vasculitis or significant stenosis. Inflow: There are atherosclerotic changes of both common iliac arteries resulting in mild stenosis bilaterally. Veins: No obvious venous abnormality within the limitations of this arterial phase study. Review of the MIP images confirms the above findings. NON-VASCULAR Hepatobiliary: There is probable hepatic steatosis. The gallbladder is hyperenhancing.There is intrahepatic and extrahepatic biliary ductal dilatation. Pancreas: The pancreatic duct is dilated. There are few calcifications involving the pancreatic head. Spleen: Unremarkable. Adrenals/Urinary Tract: --Adrenal glands: Unremarkable. --Right kidney/ureter: There is an indeterminate exophytic nodule rising from the lower pole of the right kidney measuring approximately 1.5 cm (axial series 5, image 130). This is favored to represent a proteinaceous or hemorrhagic cyst as it is minimally changed since 2020. --Left kidney/ureter: No hydronephrosis or radiopaque kidney stones. --Urinary bladder: The urinary bladder is distended. Stomach/Bowel: --Stomach/Duodenum: There is diffuse circumferential wall thickening of the gastric antrum proximal duodenum. There is an air and fluid collection adjacent to the gastric antrum which is favored to represent the duodenal bulb as opposed to a contained perforation.  --Small bowel: Unremarkable. --Colon: There is a large amount of stool throughout the colon. --Appendix: Normal. Lymphatic: --No retroperitoneal lymphadenopathy. --No mesenteric lymphadenopathy. --No pelvic or inguinal lymphadenopathy. Reproductive: Status post hysterectomy. No adnexal mass. Other: No ascites or free air. The abdominal wall is normal. Musculoskeletal. The patient is status post prior lumbar fusion. There is no acute compression fracture. Review of the MIP images confirms the above findings. IMPRESSION: 1. There is no evidence for thoracic aortic dissection. 2. There is diffuse circumferential wall thickening of the gastric antrum and proximal duodenum. Findings are concerning for peptic ulcer disease. Underlying mass is not excluded. Correlation with endoscopy is recommended. 3. There is intrahepatic and extrahepatic biliary ductal dilatation with dilatation of the pancreatic duct. An obstructing lesion is not clearly identified on this study. Correlation with laboratory studies is recommended. Follow-up with MRCP/ERCP is recommended. 4. Persistent spiculated pulmonary nodule in the right upper lobe measuring approximately 1.5 cm, highly suspicious for bronchogenic carcinoma. 5. Dilated main right and left pulmonary arteries suggestive of pulmonary artery hypertension. 6. Large amount of stool throughout the colon. 7. Distended urinary bladder. Aortic Atherosclerosis (ICD10-I70.0) and Emphysema (ICD10-J43.9). Electronically Signed   By: Constance Holster M.D.   On: 03/26/2020 03:04      CT Chest W Contrast  Result Date: 03/19/2020 CLINICAL DATA:  Dyspnea, unclear etiology with abnormal chest imaging by report. IMPRESSION: 1. 1.5 x 1.5 x 1.7 cm RIGHT upper lobe pulmonary nodule tethering the minor fissure and extending to visceral pleura in the RIGHT chest most suggestive of bronchogenic neoplasm. Referral to multi disciplinary thoracic oncologic setting with PET for further evaluation or  biopsy as warranted. 2. No mediastinal or hilar lymphadenopathy 3. Small perifissural nodule in the RIGHT chest along the minor fissure likely small intrapulmonary lymph node not changed since 2019. 4. Signs of of gastric antral edema, correlate with any symptoms of gastritis. 5. Marked biliary ductal distension and signs of pancreatic ductal dilation and irregularity showing no change when comparing this study to the study of April of 2019 and diminished in terms of pancreatic ductal distension when compared to the  examination of November of 2020. Area is incompletely imaged. Given the decreased caliber of the pancreatic duct since the most recent comparison study suspect this is due to chronic pancreatitis. Consider 6 month follow-up given presence of both pancreatic and biliary duct distension. 6. Marked hepatic steatosis without visible lesion in the imaged portions of the liver. 7. Emphysema and aortic atherosclerosis. Aortic Atherosclerosis (ICD10-I70.0) and Emphysema (ICD10-J43.9). Electronically Signed   By: Zetta Bills M.D.   On: 03/19/2020 17:26    Procedures Procedures (including critical care time)  Medications Ordered in ED Medications  sodium chloride 0.9 % bolus 1,000 mL (0 mLs Intravenous Stopped 03/26/20 0343)  ondansetron (ZOFRAN) injection 4 mg (4 mg Intravenous Given 03/26/20 0153)  iohexol (OMNIPAQUE) 350 MG/ML injection 100 mL (100 mLs Intravenous Contrast Given 03/26/20 0225)  famotidine (PEPCID) IVPB 20 mg premix (0 mg Intravenous Stopped 03/26/20 0442)  fentaNYL (SUBLIMAZE) injection 25 mcg (25 mcg Intravenous Given 03/26/20 0356)  sodium chloride 0.9 % bolus 500 mL (500 mLs Intravenous New Bag/Given 03/26/20 0446)    ED Course  I have reviewed the triage vital signs and the nursing notes.  Pertinent labs & imaging results that were available during my care of the patient were reviewed by me and considered in my medical decision making (see chart for details).    MDM  Rules/Calculators/A&P                          Patient's description of her chest pain is worrisome, it is characteristic of dissection or it could also represent PE with her underlying diagnosis of lung cancer.  Unfortunately patient has a nerve stimulator in place and did not bring her unit remote with her.  Her husband is going to bring it in.  Patient's husband brought in her nerve stimulator module and she was able to have the CTA done.  Recheck at 4:00 AM patient states her chest pain is almost gone, she states she still has some pain behind her shoulder blades.  Patient told nursing staff she is in pain management.  Review of the [place] shows she got #30 [place] ER 60 mg tablets on January 7 and #120 hydrocodone 7.5/325 on January 12 prescribed by [facility] in [place].  Patient's lipase is normal so her dilated biliary tree can be evaluated as an outpatient.  After reviewing her scan concern was for peptic ulcer disease she was given Pepcid IV.  Patient has been on Protonix in the past, we will need to make sure she stays on it.  4:38 AM patient's delta troponin is negative.  Her urinalysis shows a high specific gravity at 1.034, otherwise negative.  She was given of 500 cc bolus of fluid since we gave her contrast dye.  She had gotten the 1000 cc prior to giving [place] a urine sample.  Final Clinical Impression(s) / ED Diagnoses Final diagnoses:  Atypical chest pain  Upper back pain    Rx / DC Orders ED Discharge Orders         Ordered    methocarbamol (ROBAXIN) 500 MG tablet        03/26/20 0443         Plan discharge  Rolland Porter, MD, Barbette Or, MD 03/26/20 [phone]

## 2020-03-26 NOTE — Telephone Encounter (Signed)
Transition of Care Contact from [place]  Date of Discharge: 03/25/20 Date of Contact: 03/26/20 Method of contact: phone - attempted  Attempted to contact patient to discuss transition of care from inpatient admission.  Patient did not answer the phone.  Message was left on patient's voicemail informing them we would attempt to call them again   [place], [place]

## 2020-03-26 NOTE — Telephone Encounter (Signed)
ONO on 2lpm done by [facility] 03/09/20 Per MW- looks okay, continue o2 2lpm   I called and spoke with the pt's spouse ok per DPR and notified of results/recs and he verbalized understanding and will inform the pt.

## 2020-03-26 NOTE — Discharge Instructions (Signed)
Use ice and heat for comfort.  You should have plenty of pain pills to take.  You can take the muscle relaxer to see if that will help.  You did have some abnormalities on your CT scan of the ducts in your gallbladder and pancreas.  However your blood test for that was normal.  You can talk to your primary care doctor about ordering the recommended tests for that.  Also on your CT scan that showed some thickening of your stomach and your proximal intestine that is concerning for ulcers, make sure you take your Protonix every day.  If you do not have a gastroenterologist you can call Dr. Roseanne Kaufman office to have him follow-up on this.

## 2020-03-29 ENCOUNTER — Ambulatory Visit (INDEPENDENT_AMBULATORY_CARE_PROVIDER_SITE_OTHER): Payer: Medicare Other | Admitting: *Deleted

## 2020-03-29 DIAGNOSIS — Z0000 Encounter for general adult medical examination without abnormal findings: Secondary | ICD-10-CM | POA: Diagnosis not present

## 2020-03-29 NOTE — Progress Notes (Signed)
MEDICARE ANNUAL WELLNESS VISIT  03/29/2020  Telephone Visit Disclaimer This Medicare AWV was conducted by telephone due to national recommendations for restrictions regarding the COVID-19 Pandemic (e.g. social distancing).  I verified, using two identifiers, that I am speaking with Jasmine [name] or their authorized healthcare agent. I discussed the limitations, risks, security, and privacy concerns of performing an evaluation and management service by telephone and the potential availability of an in-person appointment in the future. The patient expressed understanding and agreed to proceed.  Location of Patient: Home Location of Provider (nurse): [facility]  Subjective:    AERIEL BOULAY is a 65 y.o. female patient of Janora Norlander, DO who had a Medicare Annual Wellness Visit today via telephone. Yulianna is retired and lives with her husband [name] and her 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 She has one son who passed away. She reports that she is socially active and does interact with friends/family regularly. She is minimally physically active and enjoys watching TV.  Patient Care Team: Janora Norlander, DO as PCP - General (Family Medicine)  Advanced Directives 03/29/2020 03/25/2020 01/08/2019 01/05/2019 01/05/2019 09/11/2017 06/30/2017  Does Patient Have a Medical Advance Directive? Yes No Yes Yes Yes Yes Yes  Type of Paramedic of [place];Living will - [place];Living will [place];Living will [place];Living will [place];Living will [place];Living will  Does patient want to make changes to medical advance directive? - - - No - Patient declined - No - Patient declined No - Patient declined  Copy of [place] in Chart? Yes - validated most recent copy scanned in chart (See row information) - - - - No - copy requested No - copy requested  Would patient like  information on creating a medical advance directive? - No - Patient declined - - - - No - Patient declined    Hospital Utilization Over the Past 12 Months: # of hospitalizations or ER visits: 1 # of surgeries: 0  Review of Systems    Patient reports that her overall health is unchanged compared to last year.  History obtained from the patient and patient chart.   Patient Reported Readings (BP, Pulse, CBG, Weight, etc) none  Pain Assessment Pain : No/denies pain     Current Medications & Allergies (verified) Allergies as of 03/29/2020      Reactions   Lyrica [pregabalin] Other (See Comments)   EXTREME SHAKING/TREMBLING   Darvon [propoxyphene]    UNSPECIFIED REACTION    Gabapentin    Extreme shaking and trembling    Augmentin [amoxicillin-pot Clavulanate] Nausea And Vomiting   Has patient had a PCN reaction causing immediate rash, facial/tongue/throat swelling, SOB or lightheadedness with hypotension:no Has patient had a PCN reaction causing severe rash involving mucus membranes or skin necrosis:No Has patient had a PCN reaction that required hospitalization:No Has patient had a PCN reaction occurring within the last 10 years:Yes--ONLY N/V If all of the above answers are "NO", then may proceed with Cephalosporin use.   Erythromycin Itching, Rash      Vibramycin [doxycycline Calcium] Itching, Rash   "[place] "      Medication List       Accurate as of March 29, 2020 11:24 AM. If you have any questions, ask your nurse or doctor.        acetaminophen 325 MG tablet Commonly known as: TYLENOL Take 2 tablets (650 mg total) by mouth every  6 (six) hours as needed for mild pain, fever or headache (or Fever >/= 101).   albuterol 108 (90 Base) MCG/ACT inhaler Commonly known as: ProAir HFA USE 2 INHALATIONS EVERY 6 HOURS AS NEEDED FOR WHEEZING OR SHORTNESS OF BREATH (NEED TO BE SEEN BEFORE NEXT REFILL)   amLODipine 10 MG tablet Commonly known as: NORVASC Take 1 tablet  (10 mg total) by mouth daily.   atorvastatin 40 MG tablet Commonly known as: Lipitor Take 1 tablet (40 mg total) by mouth daily.   budesonide-formoterol 160-4.5 MCG/ACT inhaler Commonly known as: SYMBICORT USE 2 INHALATIONS TWICE A DAY   cetirizine 10 MG tablet Commonly known as: ZYRTEC Take 10 mg by mouth daily as needed for allergies. Reported on 03/12/2015   Cholecalciferol 125 MCG (5000 UT) capsule Take 5,000 Units daily by mouth.   diclofenac sodium 1 % Gel Commonly known as: VOLTAREN Apply 1 application topically daily as needed for muscle pain.   DULoxetine 30 MG capsule Commonly known as: CYMBALTA Take 3 capsules (90 mg total) by mouth daily.   feeding supplement Liqd Take 237 mLs by mouth 2 (two) times daily between meals.   Fish Oil 1000 MG Caps Take 1 capsule by mouth daily.   guaiFENesin 600 MG 12 hr tablet Commonly known as: MUCINEX Take 600 mg by mouth 2 (two) times daily.   HYDROcodone Bitartrate ER 60 MG T24a Take 1 tablet by mouth daily.   HYDROcodone-acetaminophen 7.5-325 MG tablet Commonly known as: NORCO Take 1 tablet by mouth 4 (four) times daily.   ipratropium-albuterol 0.5-2.5 (3) MG/3ML Soln Commonly known as: DUONEB Inhale 3 mLs into the lungs 4 (four) times daily as needed (for shortness of breath/wheezing). *May use as needed   magnesium oxide 400 (241.3 Mg) MG tablet Commonly known as: MAG-OX Take 1 tablet by mouth 2 (two) times daily.   magnesium oxide 400 MG tablet Commonly known as: MAG-OX Take 400 mg by mouth 2 (two) times daily.   Metanx 3-90.314-2-35 MG Caps TAKE 1 TABLET BY MOUTH TWO TIMES A DAY   methocarbamol 500 MG tablet Commonly known as: ROBAXIN Take 1 or 2 po Q 6hrs for pain   ondansetron 8 MG disintegrating tablet Commonly known as: ZOFRAN-ODT Take 8 mg by mouth 3 (three) times daily.   OXYGEN 2lpm with sleep and as needed during the day   pantoprazole 40 MG tablet Commonly known as: PROTONIX Take 1 tablet  (40 mg total) by mouth daily. What changed: when to take this   Poly-Iron 150 Forte 150-0.025-1 MG Caps Generic drug: Iron Polysacch Cmplx-B12-FA Take 1 capsule by mouth once daily What changed: when to take this   Spiriva Respimat 2.5 MCG/ACT Aers Generic drug: Tiotropium Bromide Monohydrate Inhale 2 puffs into the lungs daily.   tizanidine 6 MG capsule Commonly known as: ZANAFLEX Take 1 capsule 3 (three) times daily by mouth.   traZODone 150 MG tablet Commonly known as: DESYREL TAKE 1 TO 2 TABLETS AT BEDTIME       History (reviewed): Past Medical History:  Diagnosis Date  . Allergy   . Anemia   . Anxiety   . Arthritis   . Asthma   . Carpal tunnel syndrome   . Cigarette smoker 08/25/2017  . Complication of anesthesia    in the past has had N/V, the last few surgeries she has not been  . COPD (chronic obstructive pulmonary disease) ([place])   . Easy bruising 07/10/2016  . GERD (gastroesophageal reflux disease)   .  Headache   . Heart murmur 2005   never had any problems  . History of blood transfusion 2018  . History of kidney stones   . Hypertension   . Hypoglycemia   . Hypoglycemia   . Iron deficiency anemia 07/11/2016  . Neuropathy    both arms  . Normocytic anemia 07/29/2015  . Pancreatitis   . Pneumonia   . PONV (postoperative nausea and vomiting)   . Postoperative anemia due to acute blood loss 11/15/2015  . Sciatica   . Seizures ([place])    as a child when she would have an asthma attack. none as an adult  . Shortness of breath dyspnea    Past Surgical History:  Procedure Laterality Date  . ABDOMINAL HYSTERECTOMY  2005  . APPLICATION OF ROBOTIC ASSISTANCE FOR SPINAL PROCEDURE N/A 09/05/2016   Procedure: APPLICATION OF ROBOTIC ASSISTANCE FOR SPINAL PROCEDURE;  Surgeon: Consuella Lose, MD;  Location: [facility];  Service: Neurosurgery;  Laterality: N/A;  . CERVICAL FUSION    . [place]  . COLONOSCOPY     unsure last  . cyst removal face    . disk  repair     neck and lumbar region  . FOOT SURGERY Bilateral    to file bones down   . HARDWARE REMOVAL N/A 09/05/2016   Procedure: HARDWARE REMOVAL AND REPLACEMENT OF LUMBAR FIVE SCREWS;  Surgeon: Consuella Lose, MD;  Location: [facility];  Service: Neurosurgery;  Laterality: N/A;  . IMPLANTATION / PLACEMENT EPIDURAL NEUROSTIMULATOR ELECTRODES    . LAMINECTOMY     2006  . LUMBAR FUSION    . SPINAL CORD STIMULATOR BATTERY EXCHANGE    . SPINAL CORD STIMULATOR INSERTION N/A 07/05/2016   Procedure: REPLACEMENT OF LUMBAR SPINAL CORD STIMULATOR BATTERY;  Surgeon: Newman Pies, MD;  Location: [facility];  Service: Neurosurgery;  Laterality: N/A;  . TONSILLECTOMY     Family History  Problem Relation Age of Onset  . Heart disease Mother   . Diabetes Mother   . Hypertension Mother   . Hyperlipidemia Mother   . COPD Mother        smoked  . Cancer Mother        bile duct  . COPD Father   . Diabetes Father   . Heart disease Father   . Cancer Father        throat  . Dementia Father   . Asthma Son   . Hypertension Sister   . Hyperlipidemia Sister   . Hypertension Brother   . Hyperlipidemia Brother   . Hypertension Brother   . Hyperlipidemia Brother   . Emphysema Maternal Grandmother        smoked  . Colon cancer Neg Hx   . Colon polyps Neg Hx    Social History   Socioeconomic History  . Marital status: Married    Spouse name: [name]  . Number of children: 1  . Years of education: Not on file  . Highest education level: Some college, no degree  Occupational History  . Not on file  Tobacco Use  . Smoking status: Current Every Day Smoker    Packs/day: 0.50    Years: 35.00    Pack years: 17.50    Types: Cigarettes  . Smokeless tobacco: Never Used  . Tobacco comment: trying to quit  Vaping Use  . Vaping Use: Never used  Substance and Sexual Activity  . Alcohol use: Yes    Alcohol/week: 2.0 standard drinks    Types:  2 Glasses of wine per week    Comment: occasionally  . Drug use:  No  . Sexual activity: Not on file  Other Topics Concern  . Not on file  Social History Narrative   Retired, 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 1 son, deceased. 2 pet dogs. Enjoys watching TV.   Social Determinants of Health   Financial Resource Strain: Not on file  Food Insecurity: Not on file  Transportation Needs: Not on file  Physical Activity: Not on file  Stress: Not on file  Social Connections: Not on file    Activities of Daily Living In your present state of health, do you have any difficulty performing the following activities: 03/29/2020  Hearing? N  Vision? N  Difficulty concentrating or making decisions? N  Walking or climbing stairs? N  Dressing or bathing? N  Doing errands, shopping? Y  Comment does not Physiological scientist and eating ? N  Using the Toilet? N  In the past six months, have you accidently leaked urine? N  Do you have problems with loss of bowel control? N  Managing your Medications? N  Managing your Finances? N  Housekeeping or managing your Housekeeping? N  Some recent data might be hidden    Patient Education/ Literacy How often do you need to have someone help you when you read instructions, pamphlets, or other written materials from your doctor or pharmacy?: 1 - Never What is the last grade level you completed in school?: 1 year college  Exercise Current Exercise Habits: Home exercise routine, Type of exercise: Other - see comments (elliptical), Time (Minutes): 15, Frequency (Times/Week): 3, Weekly Exercise (Minutes/Week): 45, Intensity: Mild, Exercise limited by: None identified  Diet Patient reports consuming 2 meals a day and 0 snack(s) a day Patient reports that her primary diet is: Regular Patient reports that she does have regular access to food.   Depression Screen PHQ 2/9 Scores 03/29/2020 03/10/2020 08/20/2019 10/29/2017 07/26/2017 07/09/2017 06/30/2017  PHQ - 2 Score 0 0 0 1 1 0 0  PHQ- 9 Score - 0 0 - - - -      Fall Risk Fall Risk  03/29/2020 03/10/2020 08/20/2019 07/26/2017 07/09/2017  Falls in the past year? 1 0 1 No No  Number falls in past yr: 1 - 1 - -  Injury with Fall? 0 - 0 - -  Risk for fall due to : History of fall(s);Impaired mobility;Orthopedic patient - Impaired balance/gait;History of fall(s) - -  Follow up Falls evaluation completed - Falls prevention discussed - -     Objective:  Jasmine [name] seemed alert and oriented and she participated appropriately during our telephone visit.  Blood Pressure Weight BMI  BP Readings from Last 3 Encounters:  03/26/20 (!) 155/81  03/10/20 136/76  02/19/20 110/66   Wt Readings from Last 3 Encounters:  03/25/20 98 lb (44.5 kg)  03/10/20 98 lb (44.5 kg)  02/19/20 96 lb 6.4 oz (43.7 kg)   BMI Readings from Last 1 Encounters:  03/25/20 17.36 kg/m    *Unable to obtain current vital signs, weight, and BMI due to telephone visit type  Hearing/Vision  . Yanil did not seem to have difficulty with hearing/understanding during the telephone conversation . Reports that she has not had a formal eye exam by an eye care professional within the past year . Reports that she has not had a formal hearing evaluation within the past year *Unable to fully assess hearing  and vision during telephone visit type  Cognitive Function: 6CIT Screen 03/29/2020  What Year? 0 points  What month? 0 points  What time? 0 points  Count back from 20 2 points  Months in reverse 0 points  Repeat phrase 0 points  Total Score 2   (Normal:0-7, Significant for Dysfunction: >8)  Normal Cognitive Function Screening: Yes   Immunization & Health Maintenance Record Immunization History  Administered Date(s) Administered  . Fluad Quad(high Dose 65+) 12/02/2018, 03/10/2020  . Influenza, Quadrivalent, Recombinant, Inj, Pf 01/16/2017  . Influenza,inj,Quad PF,6+ Mos 02/22/2015, 01/03/2016  . Influenza-Unspecified 12/04/2017  . Moderna Sars-Covid-2 Vaccination 05/29/2019,  06/29/2019  . Pneumococcal Conjugate-13 08/20/2019    Health Maintenance  Topic Date Due  . Hepatitis C Screening  Never done  . TETANUS/TDAP  Never done  . COLONOSCOPY (Pts 45-81yrs Insurance coverage will need to be confirmed)  Never done  . DEXA SCAN  Never done  . MAMMOGRAM  08/31/2018  . COVID-19 Vaccine (3 - Booster for Moderna series) 12/29/2019  . PNA vac Low Risk Adult (2 of 2 - PPSV23) 08/19/2020  . INFLUENZA VACCINE  Completed       Assessment  This is a routine wellness examination for ADAYAH AROCHO.  Health Maintenance: Due or Overdue Health Maintenance Due  Topic Date Due  . Hepatitis C Screening  Never done  . TETANUS/TDAP  Never done  . COLONOSCOPY (Pts 45-37yrs Insurance coverage will need to be confirmed)  Never done  . DEXA SCAN  Never done  . MAMMOGRAM  08/31/2018  . COVID-19 Vaccine (3 - Booster for Moderna series) 12/29/2019    Jasmine [name] does not need a referral for Community Assistance: Care Management:   no Social Work:    no Prescription Assistance:  no Nutrition/Diabetes Education:  no   Plan:  Personalized Goals Goals Addressed            This Visit's Progress   . Patient Stated       03/29/2020 AWV Goal: Fall Prevention  . Over the next year, patient will decrease their risk for falls by: o Using assistive devices, such as a cane or walker, as needed o Identifying fall risks within their home and correcting them by: - Removing throw rugs - Adding handrails to stairs or ramps - Removing clutter and keeping a clear pathway throughout the home - Increasing light, especially at night - Adding shower handles/bars - Raising toilet seat o Identifying potential personal risk factors for falls: - Medication side effects - Incontinence/urgency - Vestibular dysfunction - Hearing loss - Musculoskeletal disorders - Neurological disorders - Orthostatic hypotension        Personalized Health Maintenance & Screening  Recommendations  Td vaccine , Colon screening, mammogram  Lung Cancer Screening Recommended: yes (Low Dose CT Chest recommended if Age 41-80 years, 30 pack-year currently smoking OR have quit w/in past 15 years) Hepatitis C Screening recommended: yes HIV Screening recommended: no  Advanced Directives: Written information was not prepared per patient's request.  Referrals & Orders No orders of the defined types were placed in this encounter.   Follow-up Plan . Follow-up with Janora Norlander, DO as planned .    I have personally reviewed and noted the following in the patient's chart:   . Medical and social history . Use of alcohol, tobacco or illicit drugs  . Current medications and supplements . Functional ability and status . Nutritional status . Physical activity . Advanced directives . List of  other physicians . Hospitalizations, surgeries, and ER visits in previous 12 months . Vitals . Screenings to include cognitive, depression, and falls . Referrals and appointments  In addition, I have reviewed and discussed with Jasmine [name] certain preventive protocols, quality metrics, and best practice recommendations. A written personalized care plan for preventive services as well as general preventive health recommendations is available and can be mailed to the patient at her request.      Baldomero Lamy, LPN  5/32/9924

## 2020-04-05 ENCOUNTER — Encounter (HOSPITAL_COMMUNITY): Payer: Self-pay

## 2020-04-05 ENCOUNTER — Ambulatory Visit (HOSPITAL_COMMUNITY): Admission: RE | Admit: 2020-04-05 | Payer: Medicare Other | Source: Ambulatory Visit

## 2020-04-19 ENCOUNTER — Ambulatory Visit (HOSPITAL_COMMUNITY)
Admission: RE | Admit: 2020-04-19 | Discharge: 2020-04-19 | Disposition: A | Discharge status: Home / Self Care | Payer: Medicare Other | Source: Ambulatory Visit | Attending: Internal Medicine | Admitting: Internal Medicine

## 2020-04-19 ENCOUNTER — Other Ambulatory Visit: Payer: Self-pay

## 2020-04-19 DIAGNOSIS — R911 Solitary pulmonary nodule: Secondary | ICD-10-CM | POA: Diagnosis present

## 2020-04-19 MED ORDER — FLUDEOXYGLUCOSE F - 18 (FDG) INJECTION
6.5200 | Freq: Once | INTRAVENOUS | Status: AC | PRN
  Administered 2020-04-19: 6.52 via INTRAVENOUS

## 2020-04-20 ENCOUNTER — Other Ambulatory Visit: Payer: Self-pay | Admitting: Internal Medicine

## 2020-04-20 DIAGNOSIS — R911 Solitary pulmonary nodule: Secondary | ICD-10-CM

## 2020-04-20 NOTE — Progress Notes (Signed)
Referral to Dr Roxan Hockey was made

## 2020-04-22 ENCOUNTER — Other Ambulatory Visit: Payer: Self-pay

## 2020-04-22 ENCOUNTER — Ambulatory Visit (INDEPENDENT_AMBULATORY_CARE_PROVIDER_SITE_OTHER): Payer: Medicare Other | Admitting: Nurse Practitioner

## 2020-04-22 ENCOUNTER — Encounter: Payer: Self-pay | Admitting: Nurse Practitioner

## 2020-04-22 DIAGNOSIS — Z0000 Encounter for general adult medical examination without abnormal findings: Secondary | ICD-10-CM

## 2020-04-22 DIAGNOSIS — R935 Abnormal findings on diagnostic imaging of other abdominal regions, including retroperitoneum: Secondary | ICD-10-CM | POA: Diagnosis not present

## 2020-04-22 DIAGNOSIS — K219 Gastro-esophageal reflux disease without esophagitis: Secondary | ICD-10-CM | POA: Diagnosis not present

## 2020-04-22 MED ORDER — SUPREP BOWEL PREP KIT 17.5-3.13-1.6 GM/177ML PO SOLN
1.0000 | ORAL | 0 refills | Status: DC
Start: 2020-04-22 — End: 2020-06-21

## 2020-04-22 NOTE — Patient Instructions (Signed)
Your health issues we discussed today were:   GERD (reflux/heartburn) with nausea and abnormal CT: 1. Stop taking Protonix 2. I sent a prescription for Dexilant 60 mg to your pharmacy.  Take this pursing in the morning on energy stomach 3. We will schedule your upper endoscopy to occur at the same time as your colonoscopy 4. Further recommendations will follow your procedure 5. Call us in 3 to 4 weeks if you do not have any improvement on Dexilant  Need for colonoscopy: 1. We will schedule colonoscopy for you 2. Further recommendations will follow your colonoscopy 3. Call if you have any problems or concerning symptoms.  Overall I recommend:  1. Continue other current medications 2. Return for follow-up in 2 months versus 3 months (after your procedures) 3. Call us for any questions or concerns   ---------------------------------------------------------------  I am glad you have gotten your COVID-19 vaccination!  Even though you are fully vaccinated you should continue to follow CDC and state/local guidelines.  ---------------------------------------------------------------   At [facility] we value your feedback. You may receive a survey about your visit today. Please share your experience as we strive to create trusting relationships with our patients to provide genuine, compassionate, quality care.  We appreciate your understanding and patience as we review any laboratory studies, imaging, and other diagnostic tests that are ordered as we care for you. Our office policy is 5 business days for review of these results, and any emergent or urgent results are addressed in a timely manner for your best interest. If you do not hear from our office in 1 week, please contact us.   We also encourage the use of MyChart, which contains your medical information for your review as well. If you are not enrolled in this feature, an access code is on this after visit summary for  your convenience. Thank you for allowing [place] to be involved in your care.  It was great to see you today!  I hope you have a safe and warm winter!!  Best of luck with your lung work-up!!!!

## 2020-04-22 NOTE — Progress Notes (Signed)
CC'ED TO PCP 

## 2020-04-22 NOTE — Progress Notes (Signed)
Referring Provider: Janora Norlander, DO Primary Care Physician:  Janora Norlander, DO Primary GI:  Dr. Gala Romney  Chief Complaint  Patient presents with  . Consult    Never had TCS done prior. No FH colon cacner, no polyps in family    HPI:   Jasmine [name] is a 65 y.o. female who presents on referral from primary care to schedule colonoscopy.  Nurse/phone triage was deferred office visit due to medications likely necessitating augmented sedation.  Reviewed PCP office visit 03/10/2020 which notes she had never had a colonoscopy and recommended proceeding with scheduling.  She was subsequently referred to GI.  The patient was last seen by our service during her hospital admission in November 2020.  She was admitted for uncomplicated pancreatitis for which she clinically improved with standard treatment.  CT findings included CBD dilation 14 mm, intrahepatic biliary ductal dilation, pancreatic duct dilation measuring 8 mm.  Not a candidate for MRI due to implanted neurostimulator.  LFTs normal.  Plans for outpatient EUS in 6 weeks after discharge.  EUS was initially scheduled for 02/20/2019.  However, she called 02/06/2019 to cancel because of problems with COPD.  It does not appear that the procedure has been rescheduled.  Today she states she is doing okay overall. She states she was never able to to get EUS done. She wants to hold off for a bit on re-referral as she has multiple appointments upcoming for pulmonary nodule concerning for cancer. Tentatively planning surgical resection. Never had a colonoscopy. Denies abdominal pain, hematochezia, melena, fever, chills, unintentional weight loss. Has persistent nausea, ongoing GERD symptoms on Protonix 40 mg bid. She is agreeable to trying another PPI. Denies URI or flu-like symptoms. Denies loss of sense of taste or smell. The patient has received COVID-19 vaccination(s). They have had a booster as well. Denies chest pain, dyspnea, dizziness,  lightheadedness, syncope, near syncope. Denies any other upper or lower GI symptoms.  Past Medical History:  Diagnosis Date  . Allergy   . Anemia   . Anxiety   . Arthritis   . Asthma   . Carpal tunnel syndrome   . Cigarette smoker 08/25/2017  . Complication of anesthesia    in the past has had N/V, the last few surgeries she has not been  . COPD (chronic obstructive pulmonary disease) ([place])   . Easy bruising 07/10/2016  . GERD (gastroesophageal reflux disease)   . Headache   . Heart murmur 2005   never had any problems  . History of blood transfusion 2018  . History of kidney stones   . Hypertension   . Hypoglycemia   . Hypoglycemia   . Iron deficiency anemia 07/11/2016  . Neuropathy    both arms  . Normocytic anemia 07/29/2015  . Pancreatitis   . Pneumonia   . PONV (postoperative nausea and vomiting)   . Postoperative anemia due to acute blood loss 11/15/2015  . Sciatica   . Seizures ([place])    as a child when she would have an asthma attack. none as an adult  . Shortness of breath dyspnea     Past Surgical History:  Procedure Laterality Date  . ABDOMINAL HYSTERECTOMY  2005  . APPLICATION OF ROBOTIC ASSISTANCE FOR SPINAL PROCEDURE N/A 09/05/2016   Procedure: APPLICATION OF ROBOTIC ASSISTANCE FOR SPINAL PROCEDURE;  Surgeon: Consuella Lose, MD;  Location: [place];  Service: Neurosurgery;  Laterality: N/A;  . CERVICAL FUSION    . [place]  .  COLONOSCOPY     unsure last  . cyst removal face    . disk repair     neck and lumbar region  . FOOT SURGERY Bilateral    to file bones down   . HARDWARE REMOVAL N/A 09/05/2016   Procedure: HARDWARE REMOVAL AND REPLACEMENT OF LUMBAR FIVE SCREWS;  Surgeon: Consuella Lose, MD;  Location: [place];  Service: Neurosurgery;  Laterality: N/A;  . IMPLANTATION / PLACEMENT EPIDURAL NEUROSTIMULATOR ELECTRODES    . LAMINECTOMY     2006  . LUMBAR FUSION    . SPINAL CORD STIMULATOR BATTERY EXCHANGE    . SPINAL CORD STIMULATOR  INSERTION N/A 07/05/2016   Procedure: REPLACEMENT OF LUMBAR SPINAL CORD STIMULATOR BATTERY;  Surgeon: Newman Pies, MD;  Location: [place];  Service: Neurosurgery;  Laterality: N/A;  . TONSILLECTOMY      Current Outpatient Medications  Medication Sig Dispense Refill  . acetaminophen (TYLENOL) 325 MG tablet Take 2 tablets (650 mg total) by mouth every 6 (six) hours as needed for mild pain, fever or headache (or Fever >/= 101). 12 tablet 2  . albuterol (PROAIR HFA) 108 (90 Base) MCG/ACT inhaler USE 2 INHALATIONS EVERY 6 HOURS AS NEEDED FOR WHEEZING OR SHORTNESS OF BREATH (NEED TO BE SEEN BEFORE NEXT REFILL) 25.5 g 1  . amLODipine (NORVASC) 10 MG tablet Take 1 tablet (10 mg total) by mouth daily. 90 tablet 1  . atorvastatin (LIPITOR) 40 MG tablet Take 1 tablet (40 mg total) by mouth daily. 90 tablet 1  . budesonide-formoterol (SYMBICORT) 160-4.5 MCG/ACT inhaler USE 2 INHALATIONS TWICE A DAY 20.4 g 0  . cetirizine (ZYRTEC) 10 MG tablet Take 10 mg by mouth daily as needed for allergies. Reported on 03/12/2015    . Cholecalciferol 125 MCG (5000 UT) capsule Take 5,000 Units daily by mouth.    . diclofenac sodium (VOLTAREN) 1 % GEL Apply 1 application topically daily as needed for muscle pain.    . DULoxetine (CYMBALTA) 30 MG capsule Take 3 capsules (90 mg total) by mouth daily. 270 capsule 4  . feeding supplement, ENSURE ENLIVE, (ENSURE ENLIVE) LIQD Take 237 mLs by mouth 2 (two) times daily between meals. 237 mL 12  . guaiFENesin (MUCINEX) 600 MG 12 hr tablet Take 600 mg by mouth 2 (two) times daily.    Marland Kitchen HYDROcodone Bitartrate ER 60 MG T24A Take 1 tablet by mouth daily.    Marland Kitchen HYDROcodone-acetaminophen (NORCO) 7.5-325 MG tablet Take 1 tablet by mouth 4 (four) times daily.    Marland Kitchen ipratropium-albuterol (DUONEB) 0.5-2.5 (3) MG/3ML SOLN Inhale 3 mLs into the lungs 4 (four) times daily as needed (for shortness of breath/wheezing). *May use as needed    . L-Methylfolate-Algae-B12-B6 (METANX) 3-90.314-2-35 MG CAPS  TAKE 1 TABLET BY MOUTH TWO TIMES A DAY (Patient taking differently: daily.) 180 capsule 4  . magnesium oxide (MAG-OX) 400 (241.3 Mg) MG tablet Take 1 tablet by mouth 2 (two) times daily.    . ondansetron (ZOFRAN-ODT) 8 MG disintegrating tablet Take 8 mg by mouth 3 (three) times daily.    . OXYGEN 2lpm with sleep and as needed during the day    . pantoprazole (PROTONIX) 40 MG tablet Take 1 tablet (40 mg total) by mouth daily. (Patient taking differently: Take 40 mg by mouth 2 (two) times daily before a meal.) 90 tablet 2  . POLY-IRON 150 FORTE 150-25-1 MG-MCG-MG CAPS Take 1 capsule by mouth once daily (Patient taking differently: Take 1 capsule by mouth 2 (two) times daily.) 30  capsule 0  . Tiotropium Bromide Monohydrate (SPIRIVA RESPIMAT) 2.5 MCG/ACT AERS Inhale 2 puffs into the lungs daily. 12 g 3  . tizanidine (ZANAFLEX) 6 MG capsule Take 1 capsule 3 (three) times daily by mouth.    . traZODone (DESYREL) 150 MG tablet TAKE 1 TO 2 TABLETS AT BEDTIME 180 tablet 3   No current facility-administered medications for this visit.    Allergies as of 04/22/2020 - Review Complete 04/22/2020  Allergen Reaction Noted  . Lyrica [pregabalin] Other (See Comments) 06/29/2016  . Darvon [propoxyphene]  02/22/2015  . Gabapentin  08/31/2016  . Augmentin [amoxicillin-pot clavulanate] Nausea And Vomiting 03/19/2015  . Erythromycin Itching and Rash 02/22/2015  . Vibramycin [doxycycline calcium] Itching and Rash 02/22/2015    Family History  Problem Relation Age of Onset  . Heart disease Mother   . Diabetes Mother   . Hypertension Mother   . Hyperlipidemia Mother   . COPD Mother        smoked  . Cancer Mother        bile duct  . COPD Father   . Diabetes Father   . Heart disease Father   . Cancer Father        throat  . Dementia Father   . Asthma Son   . Hypertension Sister   . Hyperlipidemia Sister   . Hypertension Brother   . Hyperlipidemia Brother   . Hypertension Brother   . Hyperlipidemia  Brother   . Emphysema Maternal Grandmother        smoked  . Colon cancer Neg Hx   . Colon polyps Neg Hx     Social History   Socioeconomic History  . Marital status: Married    Spouse name: [name]  . Number of children: 1  . Years of education: Not on file  . Highest education level: Some college, no degree  Occupational History  . Not on file  Tobacco Use  . Smoking status: Current Every Day Smoker    Packs/day: 0.50    Years: 35.00    Pack years: 17.50    Types: Cigarettes  . Smokeless tobacco: Never Used  . Tobacco comment: trying to quit  Vaping Use  . Vaping Use: Never used  Substance and Sexual Activity  . Alcohol use: Yes    Alcohol/week: 2.0 standard drinks    Types: 2 Glasses of wine per week    Comment: occasionally  . Drug use: No  . Sexual activity: Not on file  Other Topics Concern  . Not on file  Social History Narrative   Retired, 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 1 son, deceased. 2 pet dogs. Enjoys watching TV.   Social Determinants of Health   Financial Resource Strain: Not on file  Food Insecurity: Not on file  Transportation Needs: Not on file  Physical Activity: Not on file  Stress: Not on file  Social Connections: Not on file    Subjective: Review of Systems  Constitutional: Negative for chills, fever, malaise/fatigue and weight loss.  HENT: Negative for congestion and sore throat.   Respiratory: Negative for cough and shortness of breath.   Cardiovascular: Negative for chest pain and palpitations.  Gastrointestinal: Positive for heartburn and nausea. Negative for abdominal pain, blood in stool, diarrhea, melena and vomiting.  Musculoskeletal: Negative for joint pain and myalgias.  Skin: Negative for rash.  Neurological: Negative for dizziness and weakness.  Endo/Heme/Allergies: Does not bruise/bleed easily.  Psychiatric/Behavioral: Negative for depression. The patient  is not nervous/anxious.   All other systems reviewed  and are negative.    Objective: BP 113/70   Pulse (!) 127   Temp 97.7 F (36.5 C)   Ht _0  (1.575 m)   Wt 95 lb 12.8 oz (43.5 kg)   BMI 17.52 kg/m  Physical Exam Vitals and nursing note reviewed.  Constitutional:      General: She is not in acute distress.    Appearance: Normal appearance. She is well-developed and normal weight. She is not ill-appearing, toxic-appearing or diaphoretic.  HENT:     Head: Normocephalic and atraumatic.     Nose: No congestion or rhinorrhea.  Eyes:     General: No scleral icterus. Cardiovascular:     Rate and Rhythm: Normal rate and regular rhythm.     Heart sounds: Normal heart sounds.  Pulmonary:     Effort: Pulmonary effort is normal. No respiratory distress.     Breath sounds: Normal breath sounds.  Abdominal:     General: Bowel sounds are normal.     Palpations: Abdomen is soft. There is no hepatomegaly, splenomegaly or mass.     Tenderness: There is no abdominal tenderness. There is no guarding or rebound.     Hernia: No hernia is present.  Skin:    General: Skin is warm and dry.     Coloration: Skin is not jaundiced.     Findings: No rash.  Neurological:     General: No focal deficit present.     Mental Status: She is alert and oriented to person, place, and time.  Psychiatric:        Attention and Perception: Attention normal.        Mood and Affect: Mood normal.        Speech: Speech normal.        Behavior: Behavior normal.        Thought Content: Thought content normal.        Cognition and Memory: Cognition and memory normal.      Assessment:  Very pleasant 65 year old female presents on referral from primary care to schedule a colonoscopy in addition to evaluation of GERD symptoms with associated nausea and noted abnormal CT.  No other red flag/warning signs or symptoms.  The patient has never had a colonoscopy before.  GERD symptoms: She has chronic GERD and has ongoing symptoms as well as nausea despite Protonix 40  mg twice daily.  She is agreeable to try another PPI.  Given her persistent symptoms despite a good dose of PPI we will also plan for endoscopic evaluation further evaluate.  Abnormal CT of the abdomen: CT during hospital admission November 2020 found CBD dilation of 14 mm with intrahepatic biliary ductal dilation, pancreatic ductal dilation measuring 8 mm but not a candidate for MRI due to implanted neurotransmitter.  LFTs were normal at that time and recommended EUS 6 weeks after discharge.  This was initially scheduled for December 2020 however she canceled because of COPD difficulties and does not appear to have been rescheduled.  Today she states she would like to hold off a bit on referral she has multiple appointments upcoming for pulmonary nodule concerning for cancer.  Tentative plans for surgical resection.  Need for colonoscopy: Patient states she has never had a colonoscopy before.  This will also be beneficial given her concerns for possible lung cancer as they will likely want other screenings completed as well.  No overt lower GI symptoms.  We can proceed at  this time.   Proceed with colonoscopy and EGD on propofol/MAC by Dr. Gala Romney in near future: the risks, benefits, and alternatives have been discussed with the patient in detail. The patient states understanding and desires to proceed.  ASA III (COPD on oxygen)  The patient is currently on Cymbalta, hydrocodone, iron, trazodone. The patient is not on any other anticoagulants, anxiolytics, chronic pain medications, antidepressants, antidiabetics, or iron supplements. We will have her hold iron for a week prior to procedures. We will plan for the procedure on propofol/MAC to promote adequate sedation.   Plan: 1. Stop Protonix 2. Start Dexilant 60 mg daily 3. Colonoscopy and EGD as described above 4. Follow-up in 2 months (or 3 months, dependent on procedure date)    Thank you for allowing [place] to participate in the care of [place], DNP, AGNP-C Adult & Gerontological Nurse Practitioner [facility]   04/22/2020 1:54 PM   Disclaimer: This note was dictated with voice recognition software. Similar sounding words can inadvertently be transcribed and may not be corrected upon review.

## 2020-04-27 ENCOUNTER — Other Ambulatory Visit: Payer: Self-pay

## 2020-04-27 ENCOUNTER — Telehealth: Payer: Self-pay | Admitting: Internal Medicine

## 2020-04-27 MED ORDER — OMEPRAZOLE 40 MG PO CPDR: 40.0000 mg | DELAYED_RELEASE_CAPSULE | Freq: Two times a day (BID) | ORAL | 3 refills | Status: DC

## 2020-04-27 NOTE — Telephone Encounter (Signed)
Pt said her prescription of Dexilant has not been sent to [facility] in [place] yet. Please advise.

## 2020-04-27 NOTE — Telephone Encounter (Signed)
Spoke with pt and Walden Field, NP. Dexilant 60 mg isn't covered under pts insurance. Ok per Walden Field, NP to send in Omeprazole 40 mg bid.

## 2020-05-04 ENCOUNTER — Other Ambulatory Visit: Payer: Self-pay | Admitting: Family Medicine

## 2020-05-04 DIAGNOSIS — J42 Unspecified chronic bronchitis: Secondary | ICD-10-CM

## 2020-05-06 ENCOUNTER — Other Ambulatory Visit: Payer: Self-pay

## 2020-05-06 ENCOUNTER — Institutional Professional Consult (permissible substitution) (INDEPENDENT_AMBULATORY_CARE_PROVIDER_SITE_OTHER): Payer: Medicare Other | Admitting: Thoracic Surgery (Cardiothoracic Vascular Surgery)

## 2020-05-06 ENCOUNTER — Other Ambulatory Visit: Payer: Self-pay | Admitting: *Deleted

## 2020-05-06 ENCOUNTER — Encounter: Payer: Self-pay | Admitting: Thoracic Surgery (Cardiothoracic Vascular Surgery)

## 2020-05-06 VITALS — BP 136/72 | HR 112 | Resp 20 | Ht 62.0 in | Wt 96.0 lb

## 2020-05-06 DIAGNOSIS — R911 Solitary pulmonary nodule: Secondary | ICD-10-CM

## 2020-05-06 DIAGNOSIS — Z01818 Encounter for other preprocedural examination: Secondary | ICD-10-CM

## 2020-05-06 DIAGNOSIS — I10 Essential (primary) hypertension: Secondary | ICD-10-CM

## 2020-05-06 NOTE — Progress Notes (Signed)
PCP is Janora Norlander, DO Referring Provider is Janora Norlander, DO  Chief Complaint  Patient presents with  . Consult    Initial surgical consult, solitary pulmonary nodule, PET 2/14, CTA chest 1/21, CT chest 1/14    HPI: Mrs. Clements is sent for consultation regarding a right upper lobe lung nodule.  Janely Gullickson is a 65 year old woman with a past medical history significant for asthma, COPD, tobacco abuse, reflux, hypertension, hypoglycemia, anemia, anxiety, chronic pain, sciatica, neuropathy, right foot drop, pancreatitis, seizures as a child, and pneumonia.    She is being evaluated for dyspnea.  A chest x-ray in December showed right lower lobe atelectasis versus consolidation and a right midlung nodule.  She had a CT on 03/19/2020 which showed a 1.5 x 1.5 x 1.7 cm right upper lobe lung nodule.  There was a small subpleural nodule along the minor fissure in the middle lobe.  She presented to the emergency room on 03/26/2020 with chest pain rating to her back and nausea and vomiting.  She ruled out for MI.  A CT angiogram showed no evidence of aortic dissection or pulmonary embolism.  The nodule was unchanged.  There was mention of enlarged main pulmonary arteries.  A PET/CT was done on 04/19/2020.  It showed the right upper lobe nodule was hypermetabolic with an SUV of 5.5.   She has had asthma since childhood.  She currently is smoking 1/2 pack of cigarettes daily.  She uses home oxygen at night when she sleeps but not during the day.  Physical activity is very limited due to pain and foot drop.  Short of breath with even relatively minor physical activity.  Zubrod Score: At the time of surgery this patient's most appropriate activity status/level should be described as: []    0    Normal activity, no symptoms []    1    Restricted in physical strenuous activity but ambulatory, able to do out light work []    2    Ambulatory and capable of self care, unable to do work activities, up  and about >50 % of waking hours                              []    3    Only limited self care, in bed greater than 50% of waking hours []    4    Completely disabled, no self care, confined to bed or chair []    5    Moribund  Past Medical History:  Diagnosis Date  . Allergy   . Anemia   . Anxiety   . Arthritis   . Asthma   . Carpal tunnel syndrome   . Cigarette smoker 08/25/2017  . Complication of anesthesia    in the past has had N/V, the last few surgeries she has not been  . COPD (chronic obstructive pulmonary disease) ([place])   . Easy bruising 07/10/2016  . GERD (gastroesophageal reflux disease)   . Headache   . Heart murmur 2005   never had any problems  . History of blood transfusion 2018  . History of kidney stones   . Hypertension   . Hypoglycemia   . Hypoglycemia   . Iron deficiency anemia 07/11/2016  . Neuropathy    both arms  . Normocytic anemia 07/29/2015  . Pancreatitis   . Pneumonia   . PONV (postoperative nausea and vomiting)   .  Postoperative anemia due to acute blood loss 11/15/2015  . Sciatica   . Seizures ([place])    as a child when she would have an asthma attack. none as an adult  . Shortness of breath dyspnea     Past Surgical History:  Procedure Laterality Date  . ABDOMINAL HYSTERECTOMY  2005  . APPLICATION OF ROBOTIC ASSISTANCE FOR SPINAL PROCEDURE N/A 09/05/2016   Procedure: APPLICATION OF ROBOTIC ASSISTANCE FOR SPINAL PROCEDURE;  Surgeon: Consuella Lose, MD;  Location: [place];  Service: Neurosurgery;  Laterality: N/A;  . CERVICAL FUSION    . [facility]  . COLONOSCOPY     unsure last  . cyst removal face    . disk repair     neck and lumbar region  . FOOT SURGERY Bilateral    to file bones down   . HARDWARE REMOVAL N/A 09/05/2016   Procedure: HARDWARE REMOVAL AND REPLACEMENT OF LUMBAR FIVE SCREWS;  Surgeon: Consuella Lose, MD;  Location: [facility];  Service: Neurosurgery;  Laterality: N/A;  . IMPLANTATION / PLACEMENT EPIDURAL  NEUROSTIMULATOR ELECTRODES    . LAMINECTOMY     2006  . LUMBAR FUSION    . SPINAL CORD STIMULATOR BATTERY EXCHANGE    . SPINAL CORD STIMULATOR INSERTION N/A 07/05/2016   Procedure: REPLACEMENT OF LUMBAR SPINAL CORD STIMULATOR BATTERY;  Surgeon: Newman Pies, MD;  Location: [facility];  Service: Neurosurgery;  Laterality: N/A;  . TONSILLECTOMY      Family History  Problem Relation Age of Onset  . Heart disease Mother   . Diabetes Mother   . Hypertension Mother   . Hyperlipidemia Mother   . COPD Mother        smoked  . Cancer Mother        bile duct  . COPD Father   . Diabetes Father   . Heart disease Father   . Cancer Father        throat  . Dementia Father   . Asthma Son   . Hypertension Sister   . Hyperlipidemia Sister   . Hypertension Brother   . Hyperlipidemia Brother   . Hypertension Brother   . Hyperlipidemia Brother   . Emphysema Maternal Grandmother        smoked  . Colon cancer Neg Hx   . Colon polyps Neg Hx     Social History Social History   Tobacco Use  . Smoking status: Current Every Day Smoker    Packs/day: 0.50    Years: 35.00    Pack years: 17.50    Types: Cigarettes  . Smokeless tobacco: Never Used  . Tobacco comment: trying to quit  Vaping Use  . Vaping Use: Never used  Substance Use Topics  . Alcohol use: Yes    Alcohol/week: 2.0 standard drinks    Types: 2 Glasses of wine per week    Comment: occasionally  . Drug use: No    Current Outpatient Medications  Medication Sig Dispense Refill  . acetaminophen (TYLENOL) 325 MG tablet Take 2 tablets (650 mg total) by mouth every 6 (six) hours as needed for mild pain, fever or headache (or Fever >/= 101). 12 tablet 2  . albuterol (PROAIR HFA) 108 (90 Base) MCG/ACT inhaler USE 2 INHALATIONS EVERY 6 HOURS AS NEEDED FOR WHEEZING OR SHORTNESS OF BREATH (NEED TO BE SEEN BEFORE NEXT REFILL) 25.5 g 1  . amLODipine (NORVASC) 10 MG tablet Take 1 tablet (10 mg total) by mouth daily. 90 tablet 1  .  atorvastatin (LIPITOR) 40 MG tablet Take 1 tablet (40 mg total) by mouth daily. 90 tablet 1  . budesonide-formoterol (SYMBICORT) 160-4.5 MCG/ACT inhaler USE 2 INHALATIONS TWICE A DAY 20.4 g 2  . cetirizine (ZYRTEC) 10 MG tablet Take 10 mg by mouth daily as needed for allergies. Reported on 03/12/2015    . Cholecalciferol 125 MCG (5000 UT) capsule Take 5,000 Units daily by mouth.    . diclofenac sodium (VOLTAREN) 1 % GEL Apply 1 application topically daily as needed for muscle pain.    . DULoxetine (CYMBALTA) 30 MG capsule Take 3 capsules (90 mg total) by mouth daily. 270 capsule 4  . feeding supplement, ENSURE ENLIVE, (ENSURE ENLIVE) LIQD Take 237 mLs by mouth 2 (two) times daily between meals. 237 mL 12  . guaiFENesin (MUCINEX) 600 MG 12 hr tablet Take 600 mg by mouth 2 (two) times daily.    Marland Kitchen HYDROcodone Bitartrate ER 60 MG T24A Take 1 tablet by mouth daily.    Marland Kitchen HYDROcodone-acetaminophen (NORCO) 7.5-325 MG tablet Take 1 tablet by mouth 4 (four) times daily.    Marland Kitchen ipratropium-albuterol (DUONEB) 0.5-2.5 (3) MG/3ML SOLN Inhale 3 mLs into the lungs 4 (four) times daily as needed (for shortness of breath/wheezing). *May use as needed    . L-Methylfolate-Algae-B12-B6 (METANX) 3-90.314-2-35 MG CAPS TAKE 1 TABLET BY MOUTH TWO TIMES A DAY (Patient taking differently: daily.) 180 capsule 4  . magnesium oxide (MAG-OX) 400 (241.3 Mg) MG tablet Take 1 tablet by mouth 2 (two) times daily.    . Na Sulfate-K Sulfate-Mg Sulf (SUPREP BOWEL PREP KIT) 17.5-3.13-1.6 GM/177ML SOLN Take 1 kit by mouth as directed. 354 mL 0  . omeprazole (PRILOSEC) 40 MG capsule Take 1 capsule (40 mg total) by mouth 2 (two) times daily. Take 1 capsule 30 mins before breakfast and evening meal. 90 capsule 3  . ondansetron (ZOFRAN-ODT) 8 MG disintegrating tablet Take 8 mg by mouth 3 (three) times daily.    . OXYGEN 2lpm with sleep and as needed during the day    . POLY-IRON 150 FORTE 150-25-1 MG-MCG-MG CAPS Take 1 capsule by mouth once daily  (Patient taking differently: Take 1 capsule by mouth 2 (two) times daily.) 30 capsule 0  . Tiotropium Bromide Monohydrate (SPIRIVA RESPIMAT) 2.5 MCG/ACT AERS Inhale 2 puffs into the lungs daily. 12 g 3  . tizanidine (ZANAFLEX) 6 MG capsule Take 1 capsule 3 (three) times daily by mouth.    . traZODone (DESYREL) 150 MG tablet TAKE 1 TO 2 TABLETS AT BEDTIME 180 tablet 3   No current facility-administered medications for this visit.    Allergies  Allergen Reactions  . Lyrica [Pregabalin] Other (See Comments)    EXTREME SHAKING/TREMBLING  . Darvon [Propoxyphene]     UNSPECIFIED REACTION   . Gabapentin     Extreme shaking and trembling   . Augmentin [Amoxicillin-Pot Clavulanate] Nausea And Vomiting    Has patient had a PCN reaction causing immediate rash, facial/tongue/throat swelling, SOB or lightheadedness with hypotension:no Has patient had a PCN reaction causing severe rash involving mucus membranes or skin necrosis:No Has patient had a PCN reaction that required hospitalization:No Has patient had a PCN reaction occurring within the last 10 years:Yes--ONLY N/V If all of the above answers are "NO", then may proceed with Cephalosporin use.   . Erythromycin Itching and Rash       . Vibramycin [Doxycycline Calcium] Itching and Rash    "[place] "    Review of Systems  Constitutional: Positive for unexpected  weight change. Negative for activity change (Limited).  HENT: Negative for trouble swallowing and voice change.   Eyes: Negative for visual disturbance.  Respiratory: Positive for cough, shortness of breath and wheezing. Negative for chest tightness.   Cardiovascular: Negative for chest pain and leg swelling.  Gastrointestinal: Positive for abdominal pain (Reflux).  Genitourinary: Negative for difficulty urinating and dysuria.  Musculoskeletal:       Leg cramps  Neurological: Positive for weakness (Right foot drop).       Chronic pain.  Falls  Hematological: Bruises/bleeds  easily.  All other systems reviewed and are negative.   BP 136/72 (BP Location: Right Arm, Patient Position: Sitting)   Pulse (!) 112   Resp 20   Ht 5' 2" (1.575 m)   Wt 96 lb (43.5 kg)   SpO2 93% Comment: RA  BMI 17.56 kg/m  Physical Exam Vitals reviewed.  Constitutional:      General: She is not in acute distress. HENT:     Head: Normocephalic and atraumatic.  Eyes:     General: No scleral icterus. Cardiovascular:     Rate and Rhythm: Regular rhythm. Tachycardia present.     Heart sounds: Murmur (2/6 systolic) heard.  No friction rub. No gallop.   Pulmonary:     Effort: Pulmonary effort is normal. No respiratory distress.     Breath sounds: No wheezing or rales.     Comments: Diminished breath sounds bilaterally Abdominal:     General: There is no distension.     Palpations: Abdomen is soft.     Tenderness: There is no abdominal tenderness.  Musculoskeletal:     Comments: Early clubbing  Skin:    General: Skin is warm and dry.  Neurological:     General: No focal deficit present.     Mental Status: She is alert and oriented to person, place, and time.     Cranial Nerves: No cranial nerve deficit.     Motor: Weakness (Right foot drop, brace present) present.     Gait: Gait abnormal.    Diagnostic Tests: CT chest 03/26/2020 IMPRESSION: 1. There is no evidence for thoracic aortic dissection. 2. There is diffuse circumferential wall thickening of the gastric antrum and proximal duodenum. Findings are concerning for peptic ulcer disease. Underlying mass is not excluded. Correlation with endoscopy is recommended. 3. There is intrahepatic and extrahepatic biliary ductal dilatation with dilatation of the pancreatic duct. An obstructing lesion is not clearly identified on this study. Correlation with laboratory studies is recommended. Follow-up with MRCP/ERCP is recommended. 4. Persistent spiculated pulmonary nodule in the right upper lobe measuring approximately 1.5  cm, highly suspicious for bronchogenic carcinoma. 5. Dilated main right and left pulmonary arteries suggestive of pulmonary artery hypertension. 6. Large amount of stool throughout the colon. 7. Distended urinary bladder.  Aortic Atherosclerosis (ICD10-I70.0) and Emphysema (ICD10-J43.9).   Electronically Signed   By: Constance Holster M.D.   On: 03/26/2020 03:04 NUCLEAR MEDICINE PET SKULL BASE TO THIGH  TECHNIQUE: 6.5 mCi F-18 FDG was injected intravenously. Full-ring PET imaging was performed from the skull base to thigh after the radiotracer. CT data was obtained and used for attenuation correction and anatomic localization.  Fasting blood glucose: 97 mg/dl  COMPARISON:  CT chest 03/19/2020  FINDINGS: Mediastinal blood pool activity: SUV max 2.01  Liver activity: SUV max NA  NECK: No hypermetabolic lymph nodes in the neck.  Incidental CT findings: none  CHEST: No hypermetabolic mediastinal or hilar nodes. The right upper lobe pulmonary  nodule is again seen measuring 1.7 cm with an SUV max of 5.5, image 105/3. Adjacent perifissural nodule is stable measuring 0.6 cm within SUV max of 0.48.  Incidental CT findings: Moderate changes of emphysema. Aortic atherosclerosis. Coronary artery atherosclerotic calcifications.  ABDOMEN/PELVIS: No abnormal hypermetabolic activity within the liver, pancreas, adrenal glands, or spleen. No hypermetabolic lymph nodes in the abdomen or pelvis.  Incidental CT findings: Diffuse hepatic steatosis.  SKELETON: No focal hypermetabolic activity to suggest skeletal metastasis.  Incidental CT findings: Postop change from lumbar spine fusion identified. Dorsal column stimulator is identified within the thoracic spine.  IMPRESSION: 1. Right upper lobe lung nodule is FDG avid and concerning for primary pulmonary neoplasm. No signs of FDG avid nodal metastasis or distant metastatic disease. 2. Aortic Atherosclerosis  (ICD10-I70.0) and Emphysema (ICD10-J43.9). 3. Coronary artery calcifications. 4. Hepatic steatosis.   Electronically Signed   By: Kerby Moors M.D.   On: 04/20/2020 10:03 I personally reviewed the CT and PET/CT images.  There is a new right upper lobe lung nodule with tenting of the minor fissure and avid FDG uptake.  There also has coronary artery and aortic atherosclerosis, emphysema changes, dilated pulmonary arteries.  Pulmonary function testing FVC 2.25 (77%) FEV1 1.41 (62%) FEV1 1.61 (72%) postbronchodilator DLCO 11.74 (55%)  Impression: Charmon Thorson is a 65 year old woman with a past medical history significant for asthma, COPD, tobacco abuse, reflux, hypertension, hypoglycemia, anemia, anxiety, chronic pain, sciatica, neuropathy, right foot drop, pancreatitis, seizures as a child, and pneumonia.  She has had asthma since childhood.  She has been smoking for over 35 years.  Currently 1/2 pack/day.  Also has COPD with FEV1 of about 60% of predicted.  There is some response to bronchodilators.  She was found to have a lung nodule on a chest x-ray back in December.  On CT of the chest there was a 1.5 cm right upper lobe lung nodule adjacent to the minor fissure.  On PET CT that nodule is hypermetabolic.  There is no evidence of regional or distant metastatic disease.  We discussed the differential diagnosis which includes primary bronchogenic carcinoma as well as infectious and inflammatory nodules.  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  This was not present on a CT 2 years ago.  It has to be considered a primary bronchogenic carcinoma less can be proven otherwise.  We discussed potential options of treatment which include surgical resection versus radiation.  By pulmonary function testing she has adequate reserve to tolerate resection but clinically her dyspnea is concerning that  she might not do well with surgery.  Even though she has COPD, her dyspnea seems out of proportion to the degree of COPD.  Deconditioning probably plays a significant role given her limited physical activities due to pain and foot drop.  She ruled out for MI recently but not had any other cardiac work-up.  There was a question of dilated pulmonary arteries on her CT.  We will obtain a 2D echo to assess LV function and evaluate for possible pulmonary hypertension.  We will plan to do a 6-minute walk test in the office today.  I think she would benefit from pulmonary rehabilitation.  I am not sure if there is an active program currently he has many of these were shut down during Covid.  We will check and see if they are doing pulmonary rehab at [facility] currently and if so  we will make that referral.  Plan: 6-minute walk test We will try to arrange pulmonary rehab 2D echocardiogram to evaluate LV function and assess for possible pulmonary hypertension Return in 2 weeks to discuss results  I spent over 45 minutes in review of records, images, and in consultation with Mrs. Thul today.  Melrose Nakayama, MD [facility] [phone]  6-minute walk test [facility] 588 feet, slow pace, no stopping.  No desaturation.  Hypertensive response.  Mild tachycardia.  Not ideal, but a little better than I expected after talking to her.  Still puts her into a high risk group for pulmonary resection.

## 2020-05-06 NOTE — Progress Notes (Signed)
6 Minute Walk Test Results  Patient: Jasmine [name] Date:  05/06/2020   Supplemental O2 during test? NO      Baseline End  Time   1602  1609       Heartrate  101           114      Dyspnea  NO            NO       Fatigue  NO           YES     O2 sat   92%           94%       Blood pressure 115/83          172/82     Patient ambulated at a slow pace (with walker) for a total distance of 588 feet with 0 stops.  Ambulation was limited primarily due to Leg pain.  Overall the test was tolerated well. No stops.

## 2020-05-07 ENCOUNTER — Other Ambulatory Visit: Payer: Self-pay | Admitting: *Deleted

## 2020-05-07 DIAGNOSIS — R911 Solitary pulmonary nodule: Secondary | ICD-10-CM

## 2020-05-07 NOTE — Progress Notes (Signed)
Per Dr. Roxan Hockey, Pulmonary Rehab order placed for pre-op conditioning at [facility].

## 2020-05-19 ENCOUNTER — Inpatient Hospital Stay (HOSPITAL_COMMUNITY): Payer: Medicare Other | Attending: Hematology

## 2020-05-19 ENCOUNTER — Other Ambulatory Visit: Payer: Self-pay

## 2020-05-19 ENCOUNTER — Other Ambulatory Visit: Payer: Self-pay | Admitting: Thoracic Surgery (Cardiothoracic Vascular Surgery)

## 2020-05-19 DIAGNOSIS — Z01818 Encounter for other preprocedural examination: Secondary | ICD-10-CM

## 2020-05-19 DIAGNOSIS — I272 Pulmonary hypertension, unspecified: Secondary | ICD-10-CM

## 2020-05-19 DIAGNOSIS — D508 Other iron deficiency anemias: Secondary | ICD-10-CM

## 2020-05-19 DIAGNOSIS — R911 Solitary pulmonary nodule: Secondary | ICD-10-CM | POA: Diagnosis not present

## 2020-05-19 DIAGNOSIS — D509 Iron deficiency anemia, unspecified: Secondary | ICD-10-CM | POA: Diagnosis present

## 2020-05-19 LAB — CBC WITH DIFFERENTIAL/PLATELET
Abs Immature Granulocytes: 0.03 10*3/uL (ref 0.00–0.07)
Basophils Absolute: 0.1 10*3/uL (ref 0.0–0.1)
Basophils Relative: 1 %
Eosinophils Absolute: 0.1 10*3/uL (ref 0.0–0.5)
Eosinophils Relative: 2 %
HCT: 38 % (ref 36.0–46.0)
Hemoglobin: 11.7 g/dL — ABNORMAL LOW (ref 12.0–15.0)
Immature Granulocytes: 0 %
Lymphocytes Relative: 18 %
Lymphs Abs: 1.3 10*3/uL (ref 0.7–4.0)
MCH: 33.2 pg (ref 26.0–34.0)
MCHC: 30.8 g/dL (ref 30.0–36.0)
MCV: 108 fL — ABNORMAL HIGH (ref 80.0–100.0)
Monocytes Absolute: 0.9 10*3/uL (ref 0.1–1.0)
Monocytes Relative: 12 %
Neutro Abs: 4.9 10*3/uL (ref 1.7–7.7)
Neutrophils Relative %: 67 %
Platelets: 200 10*3/uL (ref 150–400)
RBC: 3.52 MIL/uL — ABNORMAL LOW (ref 3.87–5.11)
RDW: 14.5 % (ref 11.5–15.5)
WBC: 7.2 10*3/uL (ref 4.0–10.5)
nRBC: 0 % (ref 0.0–0.2)

## 2020-05-19 LAB — FERRITIN: Ferritin: 99 ng/mL (ref 11–307)

## 2020-05-19 LAB — IRON AND TIBC
Iron: 27 ug/dL — ABNORMAL LOW (ref 28–170)
Saturation Ratios: 6 % — ABNORMAL LOW (ref 10.4–31.8)
TIBC: 446 ug/dL (ref 250–450)
UIBC: 419 ug/dL

## 2020-05-26 ENCOUNTER — Other Ambulatory Visit (HOSPITAL_COMMUNITY): Payer: Self-pay

## 2020-05-26 ENCOUNTER — Telehealth (HOSPITAL_COMMUNITY): Payer: TRICARE For Life (TFL) | Admitting: Hematology

## 2020-05-26 ENCOUNTER — Other Ambulatory Visit (HOSPITAL_COMMUNITY): Payer: Self-pay | Admitting: *Deleted

## 2020-05-26 ENCOUNTER — Encounter (HOSPITAL_COMMUNITY): Payer: Self-pay

## 2020-05-26 DIAGNOSIS — D508 Other iron deficiency anemias: Secondary | ICD-10-CM

## 2020-05-26 MED ORDER — POLY-IRON 150 FORTE 150-25-1 MG-MCG-MG PO CAPS: 1.0000 | ORAL_CAPSULE | Freq: Two times a day (BID) | ORAL | 2 refills | Status: DC

## 2020-05-26 NOTE — Telephone Encounter (Signed)
Chart reviewed, iron increased to twice daily per Dr. Rob Hickman last office note.

## 2020-05-27 MED ORDER — POLY-IRON 150 FORTE 150-25-1 MG-MCG-MG PO CAPS: 1.0000 | ORAL_CAPSULE | Freq: Two times a day (BID) | ORAL | 2 refills | Status: DC

## 2020-05-28 ENCOUNTER — Inpatient Hospital Stay (HOSPITAL_BASED_OUTPATIENT_CLINIC_OR_DEPARTMENT_OTHER): Payer: Medicare Other | Admitting: Hematology

## 2020-05-28 ENCOUNTER — Encounter: Payer: Self-pay | Admitting: Family Medicine

## 2020-05-28 ENCOUNTER — Other Ambulatory Visit: Payer: Self-pay

## 2020-05-28 DIAGNOSIS — D508 Other iron deficiency anemias: Secondary | ICD-10-CM

## 2020-05-28 NOTE — Progress Notes (Signed)
Virtual Visit via Telephone Note  I connected with Jasmine [name] on 05/28/20 at 11:30 AM EDT by telephone and verified that I am speaking with the correct person using two identifiers.  Location: Patient: At home Provider: At home   I discussed the limitations, risks, security and privacy concerns of performing an evaluation and management service by telephone and the availability of in person appointments. I also discussed with the patient that there may be a patient responsible charge related to this service. The patient expressed understanding and agreed to proceed.   History of Present Illness: This patient is followed in our clinic for iron deficiency anemia.  She received Feraheme last time in May 2018.   Observations/Objective: She denies any bleeding per rectum or melena.  She is taking iron tablet twice daily without any major issues like constipation.  Energy levels are feeling good.  She reports to me that they found a new spot on her lungs when he was having breathing issues in December.  She was seen by Dr. Roxan Hockey and is about to start pulmonary rehab for optimization prior to surgery.  Assessment and Plan:  1.  Macrocytic anemia: -Reviewed her hemoglobin which was 11.7 and hematocrit 38 which was normal. -Ferritin was 99, improved from 25 December. -She will continue iron tablet twice daily. -We will do work-up for macrocytosis with a TSH, T77, folic acid, methylmalonic acid at next visit.  2.  Right upper lobe lung nodule: -I have reviewed PET scan from 04/19/2020.  New right upper lobe lung nodule measuring 1.7 cm with SUV 5.5.  Adjacent perifissural nodule measuring 0.6 cm.  No signs of nodal metastasis. -She has follow-up with Dr. Roxan Hockey on 06/08/2020. -RTC 4 weeks after surgery to discuss results.   Follow Up Instructions:   RTC 10 weeks with labs. I discussed the assessment and treatment plan with the patient. The patient was provided an opportunity to  ask questions and all were answered. The patient agreed with the plan and demonstrated an understanding of the instructions.   The patient was advised to call back or seek an in-person evaluation if the symptoms worsen or if the condition fails to improve as anticipated.  I provided 21 minutes of non-face-to-face time during this encounter.   Derek Jack, MD

## 2020-06-02 ENCOUNTER — Ambulatory Visit (INDEPENDENT_AMBULATORY_CARE_PROVIDER_SITE_OTHER): Payer: Medicare Other

## 2020-06-02 DIAGNOSIS — I272 Pulmonary hypertension, unspecified: Secondary | ICD-10-CM | POA: Diagnosis not present

## 2020-06-02 DIAGNOSIS — Z01818 Encounter for other preprocedural examination: Secondary | ICD-10-CM

## 2020-06-02 DIAGNOSIS — Z01810 Encounter for preprocedural cardiovascular examination: Secondary | ICD-10-CM | POA: Diagnosis not present

## 2020-06-02 LAB — ECHOCARDIOGRAM COMPLETE
AR max vel: 1.83 cm2
AV Area VTI: 1.99 cm2
AV Area mean vel: 1.99 cm2
AV Mean grad: 5.3 mmHg
AV Peak grad: 12 mmHg
Ao pk vel: 1.73 m/s
Area-P 1/2: 8.25 cm2
Calc EF: 61 %
MV M vel: 5.43 m/s
MV Peak grad: 117.8 mmHg
S' Lateral: 2.64 cm
Single Plane A2C EF: 65.2 %
Single Plane A4C EF: 56.7 %

## 2020-06-08 ENCOUNTER — Encounter: Payer: Medicare Other | Admitting: Thoracic Surgery (Cardiothoracic Vascular Surgery)

## 2020-06-09 NOTE — Patient Instructions (Signed)
Jasmine [name]  06/09/2020     @PREFPERIOPPHARMACY @   Your procedure is scheduled on  06/14/2020   Report to [facility] at  1000  A.M.   Call this number if you have problems the morning of surgery:  [phone]   Remember:  Follow the diet and prep instructions given to you by the office.                      Take these medicines the morning of surgery with A SIP OF WATER  Amlodipine, zyrtec, cymbalta, hydrocodone or Norco, metanx, prilosec, zofran (if needed), zanaflex (if needed).  Use your inhalers and your nebulizer before you come.     Please brush your teeth.   Do not wear jewelry, make-up or nail polish.  Do not wear lotions, powders, or perfumes, or deodorant.  Do not shave 48 hours prior to surgery.  Men may shave face and neck.  Do not bring valuables to the hospital.  [facility] is not responsible for any belongings or valuables.   Contacts, dentures or bridgework may not be worn into surgery.  Leave your suitcase in the car.  After surgery it may be brought to your room.  For patients admitted to the hospital, discharge time will be determined by your treatment team.  Patients discharged the day of surgery will not be allowed to drive home and must have someone with them for 24 hours.     Special instructions:  DO NOT smoke tobacco or vape for 24 hours before your procedure.   Please read over the following fact sheets that you were given. Anesthesia Post-op Instructions and Care and Recovery After Surgery       Upper Endoscopy, Adult, Care After This sheet gives you information about how to care for yourself after your procedure. Your health care provider may also give you more specific instructions. If you have problems or questions, contact your health care provider. What can I expect after the procedure? After the procedure, it is common to have:  A sore throat.  Mild stomach pain or discomfort.  Bloating.  Nausea. Follow  these instructions at home:  Follow instructions from your health care provider about what to eat or drink after your procedure.  Return to your normal activities as told by your health care provider. Ask your health care provider what activities are safe for you.  Take over-the-counter and prescription medicines only as told by your health care provider.  If you were given a sedative during the procedure, it can affect you for several hours. Do not drive or operate machinery until your health care provider says that it is safe.  Keep all follow-up visits as told by your health care provider. This is important.   Contact a health care provider if you have:  A sore throat that lasts longer than one day.  Trouble swallowing. Get help right away if:  You vomit blood or your vomit looks like coffee grounds.  You have: ? A fever. ? Bloody, black, or tarry stools. ? A severe sore throat or you cannot swallow. ? Difficulty breathing. ? Severe pain in your chest or abdomen. Summary  After the procedure, it is common to have a sore throat, mild stomach discomfort, bloating, and nausea.  If you were given a sedative during the procedure, it can affect you for several hours. Do not drive or operate machinery until your health  care provider says that it is safe.  Follow instructions from your health care provider about what to eat or drink after your procedure.  Return to your normal activities as told by your health care provider. This information is not intended to replace advice given to you by your health care provider. Make sure you discuss any questions you have with your health care provider. Document Revised: 02/18/2019 Document Reviewed: 07/23/2017 Elsevier Patient Education  2021 [place].  Colonoscopy, Adult, Care After This sheet gives you information about how to care for yourself after your procedure. Your health care provider may also give you more specific  instructions. If you have problems or questions, contact your health care provider. What can I expect after the procedure? After the procedure, it is common to have:  A small amount of blood in your stool for 24 hours after the procedure.  Some gas.  Mild cramping or bloating of your abdomen. Follow these instructions at home: Eating and drinking  Drink enough fluid to keep your urine pale yellow.  Follow instructions from your health care provider about eating or drinking restrictions.  Resume your normal diet as instructed by your health care provider. Avoid heavy or fried foods that are hard to digest.   Activity  Rest as told by your health care provider.  Avoid sitting for a long time without moving. Get up to take short walks every 1-2 hours. This is important to improve blood flow and breathing. Ask for help if you feel weak or unsteady.  Return to your normal activities as told by your health care provider. Ask your health care provider what activities are safe for you. Managing cramping and bloating  Try walking around when you have cramps or feel bloated.  Apply heat to your abdomen as told by your health care provider. Use the heat source that your health care provider recommends, such as a moist heat pack or a heating pad. ? Place a towel between your skin and the heat source. ? Leave the heat on for 20-30 minutes. ? Remove the heat if your skin turns bright red. This is especially important if you are unable to feel pain, heat, or cold. You may have a greater risk of getting burned.   General instructions  If you were given a sedative during the procedure, it can affect you for several hours. Do not drive or operate machinery until your health care provider says that it is safe.  For the first 24 hours after the procedure: ? Do not sign important documents. ? Do not drink alcohol. ? Do your regular daily activities at a slower pace than normal. ? Eat soft foods  that are easy to digest.  Take over-the-counter and prescription medicines only as told by your health care provider.  Keep all follow-up visits as told by your health care provider. This is important. Contact a health care provider if:  You have blood in your stool 2-3 days after the procedure. Get help right away if you have:  More than a small spotting of blood in your stool.  Large blood clots in your stool.  Swelling of your abdomen.  Nausea or vomiting.  A fever.  Increasing pain in your abdomen that is not relieved with medicine. Summary  After the procedure, it is common to have a small amount of blood in your stool. You may also have mild cramping and bloating of your abdomen.  If you were given a sedative during the  procedure, it can affect you for several hours. Do not drive or operate machinery until your health care provider says that it is safe.  Get help right away if you have a lot of blood in your stool, nausea or vomiting, a fever, or increased pain in your abdomen. This information is not intended to replace advice given to you by your health care provider. Make sure you discuss any questions you have with your health care provider. Document Revised: 02/14/2019 Document Reviewed: 09/16/2018 Elsevier Patient Education  2021 [place] After This sheet gives you information about how to care for yourself after your procedure. Your health care provider may also give you more specific instructions. If you have problems or questions, contact your health care provider. What can I expect after the procedure? After the procedure, it is common to have:  Tiredness.  Forgetfulness about what happened after the procedure.  Impaired judgment for important decisions.  Nausea or vomiting.  Some difficulty with balance. Follow these instructions at home: For the time period you were told by your health care provider:  Rest as  needed.  Do not participate in activities where you could fall or become injured.  Do not drive or use machinery.  Do not drink alcohol.  Do not take sleeping pills or medicines that cause drowsiness.  Do not make important decisions or sign legal documents.  Do not take care of children on your own.      Eating and drinking  Follow the diet that is recommended by your health care provider.  Drink enough fluid to keep your urine pale yellow.  If you vomit: ? Drink water, juice, or soup when you can drink without vomiting. ? Make sure you have little or no nausea before eating solid foods. General instructions  Have a responsible adult stay with you for the time you are told. It is important to have someone help care for you until you are awake and alert.  Take over-the-counter and prescription medicines only as told by your health care provider.  If you have sleep apnea, surgery and certain medicines can increase your risk for breathing problems. Follow instructions from your health care provider about wearing your sleep device: ? Anytime you are sleeping, including during daytime naps. ? While taking prescription pain medicines, sleeping medicines, or medicines that make you drowsy.  Avoid smoking.  Keep all follow-up visits as told by your health care provider. This is important. Contact a health care provider if:  You keep feeling nauseous or you keep vomiting.  You feel light-headed.  You are still sleepy or having trouble with balance after 24 hours.  You develop a rash.  You have a fever.  You have redness or swelling around the IV site. Get help right away if:  You have trouble breathing.  You have new-onset confusion at home. Summary  For several hours after your procedure, you may feel tired. You may also be forgetful and have poor judgment.  Have a responsible adult stay with you for the time you are told. It is important to have someone help care  for you until you are awake and alert.  Rest as told. Do not drive or operate machinery. Do not drink alcohol or take sleeping pills.  Get help right away if you have trouble breathing, or if you suddenly become confused. This information is not intended to replace advice given to you by your health care provider. Make sure you  discuss any questions you have with your health care provider. Document Revised: 11/06/2019 Document Reviewed: 01/23/2019 Elsevier Patient Education  2021 [employer].

## 2020-06-10 ENCOUNTER — Encounter (HOSPITAL_COMMUNITY): Payer: Self-pay

## 2020-06-10 ENCOUNTER — Encounter (HOSPITAL_COMMUNITY)
Admission: RE | Admit: 2020-06-10 | Discharge: 2020-06-10 | Disposition: A | Discharge status: Home / Self Care | Payer: Medicare Other | Source: Ambulatory Visit | Attending: Internal Medicine | Admitting: Internal Medicine

## 2020-06-10 ENCOUNTER — Other Ambulatory Visit (HOSPITAL_COMMUNITY)
Admission: RE | Admit: 2020-06-10 | Discharge: 2020-06-10 | Disposition: A | Discharge status: Home / Self Care | Payer: Medicare Other | Source: Ambulatory Visit | Attending: Internal Medicine | Admitting: Internal Medicine

## 2020-06-10 ENCOUNTER — Other Ambulatory Visit: Payer: Self-pay

## 2020-06-10 DIAGNOSIS — Z01812 Encounter for preprocedural laboratory examination: Secondary | ICD-10-CM | POA: Insufficient documentation

## 2020-06-10 DIAGNOSIS — Z20822 Contact with and (suspected) exposure to covid-19: Secondary | ICD-10-CM | POA: Diagnosis not present

## 2020-06-10 HISTORY — DX: Other chronic pain: G89.29

## 2020-06-10 HISTORY — DX: Malignant (primary) neoplasm, unspecified: C80.1

## 2020-06-11 ENCOUNTER — Encounter (HOSPITAL_COMMUNITY): Payer: Medicare Other

## 2020-06-11 LAB — SARS CORONAVIRUS 2 (TAT 6-24 HRS): SARS Coronavirus 2: NEGATIVE

## 2020-06-14 ENCOUNTER — Ambulatory Visit (HOSPITAL_COMMUNITY)
Admission: RE | Admit: 2020-06-14 | Discharge: 2020-06-14 | Disposition: A | Discharge status: Home / Self Care | Payer: Medicare Other | Attending: Internal Medicine | Admitting: Internal Medicine

## 2020-06-14 ENCOUNTER — Encounter (HOSPITAL_COMMUNITY)
Admission: RE | Disposition: A | Discharge status: Home / Self Care | Payer: Self-pay | Source: Home / Self Care | Attending: Internal Medicine

## 2020-06-14 ENCOUNTER — Ambulatory Visit (HOSPITAL_COMMUNITY): Payer: Medicare Other | Admitting: Anesthesiology

## 2020-06-14 ENCOUNTER — Encounter (HOSPITAL_COMMUNITY): Payer: Self-pay | Admitting: Internal Medicine

## 2020-06-14 DIAGNOSIS — K219 Gastro-esophageal reflux disease without esophagitis: Secondary | ICD-10-CM | POA: Diagnosis not present

## 2020-06-14 DIAGNOSIS — Z79899 Other long term (current) drug therapy: Secondary | ICD-10-CM | POA: Diagnosis not present

## 2020-06-14 DIAGNOSIS — Z981 Arthrodesis status: Secondary | ICD-10-CM | POA: Insufficient documentation

## 2020-06-14 DIAGNOSIS — K635 Polyp of colon: Secondary | ICD-10-CM | POA: Diagnosis not present

## 2020-06-14 DIAGNOSIS — Z880 Allergy status to penicillin: Secondary | ICD-10-CM | POA: Insufficient documentation

## 2020-06-14 DIAGNOSIS — Z8349 Family history of other endocrine, nutritional and metabolic diseases: Secondary | ICD-10-CM | POA: Diagnosis not present

## 2020-06-14 DIAGNOSIS — D123 Benign neoplasm of transverse colon: Secondary | ICD-10-CM | POA: Diagnosis not present

## 2020-06-14 DIAGNOSIS — Z820 Family history of epilepsy and other diseases of the nervous system: Secondary | ICD-10-CM | POA: Insufficient documentation

## 2020-06-14 DIAGNOSIS — Z800 Family history of malignant neoplasm of digestive organs: Secondary | ICD-10-CM | POA: Insufficient documentation

## 2020-06-14 DIAGNOSIS — Z881 Allergy status to other antibiotic agents status: Secondary | ICD-10-CM | POA: Diagnosis not present

## 2020-06-14 DIAGNOSIS — Z825 Family history of asthma and other chronic lower respiratory diseases: Secondary | ICD-10-CM | POA: Insufficient documentation

## 2020-06-14 DIAGNOSIS — Z802 Family history of malignant neoplasm of other respiratory and intrathoracic organs: Secondary | ICD-10-CM | POA: Diagnosis not present

## 2020-06-14 DIAGNOSIS — Z1211 Encounter for screening for malignant neoplasm of colon: Secondary | ICD-10-CM | POA: Diagnosis present

## 2020-06-14 DIAGNOSIS — K3189 Other diseases of stomach and duodenum: Secondary | ICD-10-CM | POA: Diagnosis not present

## 2020-06-14 DIAGNOSIS — Z888 Allergy status to other drugs, medicaments and biological substances status: Secondary | ICD-10-CM | POA: Diagnosis not present

## 2020-06-14 DIAGNOSIS — F17210 Nicotine dependence, cigarettes, uncomplicated: Secondary | ICD-10-CM | POA: Insufficient documentation

## 2020-06-14 DIAGNOSIS — Z85118 Personal history of other malignant neoplasm of bronchus and lung: Secondary | ICD-10-CM | POA: Diagnosis not present

## 2020-06-14 DIAGNOSIS — Z7951 Long term (current) use of inhaled steroids: Secondary | ICD-10-CM | POA: Diagnosis not present

## 2020-06-14 DIAGNOSIS — Z87442 Personal history of urinary calculi: Secondary | ICD-10-CM | POA: Insufficient documentation

## 2020-06-14 DIAGNOSIS — Z8249 Family history of ischemic heart disease and other diseases of the circulatory system: Secondary | ICD-10-CM | POA: Insufficient documentation

## 2020-06-14 DIAGNOSIS — Z79891 Long term (current) use of opiate analgesic: Secondary | ICD-10-CM | POA: Diagnosis not present

## 2020-06-14 DIAGNOSIS — Z833 Family history of diabetes mellitus: Secondary | ICD-10-CM | POA: Diagnosis not present

## 2020-06-14 HISTORY — PX: COLONOSCOPY WITH PROPOFOL: SHX5780

## 2020-06-14 HISTORY — PX: BIOPSY: SHX5522

## 2020-06-14 HISTORY — PX: POLYPECTOMY: SHX149

## 2020-06-14 HISTORY — PX: ESOPHAGOGASTRODUODENOSCOPY (EGD) WITH PROPOFOL: SHX5813

## 2020-06-14 SURGERY — COLONOSCOPY WITH PROPOFOL
Anesthesia: General

## 2020-06-14 MED ORDER — LIDOCAINE VISCOUS HCL 2 % MT SOLN
OROMUCOSAL | Status: AC
  Filled 2020-06-14: qty 15

## 2020-06-14 MED ORDER — PROPOFOL 10 MG/ML IV BOLUS
INTRAVENOUS | Status: AC
  Filled 2020-06-14: qty 60

## 2020-06-14 MED ORDER — PROPOFOL 10 MG/ML IV BOLUS
INTRAVENOUS | Status: DC | PRN
  Administered 2020-06-14: 50 mg via INTRAVENOUS
  Administered 2020-06-14: 15 mg via INTRAVENOUS
  Administered 2020-06-14 (×3): 50 mg via INTRAVENOUS
  Administered 2020-06-14: 80 mg via INTRAVENOUS
  Administered 2020-06-14: 20 mg via INTRAVENOUS
  Administered 2020-06-14: 50 mg via INTRAVENOUS
  Administered 2020-06-14: 30 mg via INTRAVENOUS
  Administered 2020-06-14: 50 mg via INTRAVENOUS
  Administered 2020-06-14: 20 mg via INTRAVENOUS
  Administered 2020-06-14 (×2): 50 mg via INTRAVENOUS
  Administered 2020-06-14: 30 mg via INTRAVENOUS
  Administered 2020-06-14: 50 mg via INTRAVENOUS
  Administered 2020-06-14: 20 mg via INTRAVENOUS

## 2020-06-14 MED ORDER — LACTATED RINGERS IV SOLN: INTRAVENOUS | Status: DC

## 2020-06-14 MED ORDER — STERILE WATER FOR IRRIGATION IR SOLN
Status: DC | PRN
  Administered 2020-06-14: 200 mL

## 2020-06-14 MED ORDER — LIDOCAINE VISCOUS HCL 2 % MT SOLN: 15.0000 mL | Freq: Once | OROMUCOSAL | Status: DC

## 2020-06-14 MED ORDER — PROPOFOL 10 MG/ML IV BOLUS
INTRAVENOUS | Status: AC
  Filled 2020-06-14: qty 40

## 2020-06-14 MED ORDER — PROPOFOL 500 MG/50ML IV EMUL
INTRAVENOUS | Status: DC | PRN
  Administered 2020-06-14: 150 ug/kg/min via INTRAVENOUS

## 2020-06-14 NOTE — Discharge Instructions (Signed)
Colonoscopy Discharge Instructions  Read the instructions outlined below and refer to this sheet in the next few weeks. These discharge instructions provide you with general information on caring for yourself after you leave the hospital. Your doctor may also give you specific instructions. While your treatment has been planned according to the most current medical practices available, unavoidable complications occasionally occur. If you have any problems or questions after discharge, call Dr. Gala Romney at [phone]. ACTIVITY  You may resume your regular activity, but move at a slower pace for the next 24 hours.   Take frequent rest periods for the next 24 hours.   Walking will help get rid of the air and reduce the bloated feeling in your belly (abdomen).   No driving for 24 hours (because of the medicine (anesthesia) used during the test).    Do not sign any important legal documents or operate any machinery for 24 hours (because of the anesthesia used during the test).  NUTRITION  Drink plenty of fluids.   You may resume your normal diet as instructed by your doctor.   Begin with a light meal and progress to your normal diet. Heavy or fried foods are harder to digest and may make you feel sick to your stomach (nauseated).   Avoid alcoholic beverages for 24 hours or as instructed.  MEDICATIONS  You may resume your normal medications unless your doctor tells you otherwise.  WHAT YOU CAN EXPECT TODAY  Some feelings of bloating in the abdomen.   Passage of more gas than usual.   Spotting of blood in your stool or on the toilet paper.  IF YOU HAD POLYPS REMOVED DURING THE COLONOSCOPY:  No aspirin products for 7 days or as instructed.   No alcohol for 7 days or as instructed.   Eat a soft diet for the next 24 hours.  FINDING OUT THE RESULTS OF YOUR TEST Not all test results are available during your visit. If your test results are not back during the visit, make an appointment  with your caregiver to find out the results. Do not assume everything is normal if you have not heard from your caregiver or the medical facility. It is important for you to follow up on all of your test results.  SEEK IMMEDIATE MEDICAL ATTENTION IF:  You have more than a spotting of blood in your stool.   Your belly is swollen (abdominal distention).   You are nauseated or vomiting.   You have a temperature over 101.   You have abdominal pain or discomfort that is severe or gets worse throughout the day.    EGD Discharge instructions Please read the instructions outlined below and refer to this sheet in the next few weeks. These discharge instructions provide you with general information on caring for yourself after you leave the hospital. Your doctor may also give you specific instructions. While your treatment has been planned according to the most current medical practices available, unavoidable complications occasionally occur. If you have any problems or questions after discharge, please call your doctor. ACTIVITY  You may resume your regular activity but move at a slower pace for the next 24 hours.   Take frequent rest periods for the next 24 hours.   Walking will help expel (get rid of) the air and reduce the bloated feeling in your abdomen.   No driving for 24 hours (because of the anesthesia (medicine) used during the test).   You may shower.   Do not sign  any important legal documents or operate any machinery for 24 hours (because of the anesthesia used during the test).  NUTRITION  Drink plenty of fluids.   You may resume your normal diet.   Begin with a light meal and progress to your normal diet.   Avoid alcoholic beverages for 24 hours or as instructed by your caregiver.  MEDICATIONS  You may resume your normal medications unless your caregiver tells you otherwise.  WHAT YOU CAN EXPECT TODAY  You may experience abdominal discomfort such as a feeling of  fullness or "gas" pains.  FOLLOW-UP  Your doctor will discuss the results of your test with you.  SEEK IMMEDIATE MEDICAL ATTENTION IF ANY OF THE FOLLOWING OCCUR:  Excessive nausea (feeling sick to your stomach) and/or vomiting.   Severe abdominal pain and distention (swelling).   Trouble swallowing.   Temperature over 101 F (37.8 C).   Rectal bleeding or vomiting of blood.   5 polyps removed your colon today  Your stomach was inflamed.  Biopsies taken.  Continue omeprazole twice daily  Further recommendations to follow pending review of pathology report  Office visit with [place] in 4 weeks -   At patient request, I called Trude Mcburney at [phone] rolled to voicemail.  Message left.   Monitored Anesthesia Care, Care After This sheet gives you information about how to care for yourself after your procedure. Your health care provider may also give you more specific instructions. If you have problems or questions, contact your health care provider. What can I expect after the procedure? After the procedure, it is common to have:  Tiredness.  Forgetfulness about what happened after the procedure.  Impaired judgment for important decisions.  Nausea or vomiting.  Some difficulty with balance. Follow these instructions at home: For the time period you were told by your health care provider:  Rest as needed.  Do not participate in activities where you could fall or become injured.  Do not drive or use machinery.  Do not drink alcohol.  Do not take sleeping pills or medicines that cause drowsiness.  Do not make important decisions or sign legal documents.  Do not take care of children on your own.      Eating and drinking  Follow the diet that is recommended by your health care provider.  Drink enough fluid to keep your urine pale yellow.  If you vomit: ? Drink water, juice, or soup when you can drink without vomiting. ? Make sure you have little or  no nausea before eating solid foods. General instructions  Have a responsible adult stay with you for the time you are told. It is important to have someone help care for you until you are awake and alert.  Take over-the-counter and prescription medicines only as told by your health care provider.  If you have sleep apnea, surgery and certain medicines can increase your risk for breathing problems. Follow instructions from your health care provider about wearing your sleep device: ? Anytime you are sleeping, including during daytime naps. ? While taking prescription pain medicines, sleeping medicines, or medicines that make you drowsy.  Avoid smoking.  Keep all follow-up visits as told by your health care provider. This is important. Contact a health care provider if:  You keep feeling nauseous or you keep vomiting.  You feel light-headed.  You are still sleepy or having trouble with balance after 24 hours.  You develop a rash.  You have a fever.  You have redness  or swelling around the IV site. Get help right away if:  You have trouble breathing.  You have new-onset confusion at home. Summary  For several hours after your procedure, you may feel tired. You may also be forgetful and have poor judgment.  Have a responsible adult stay with you for the time you are told. It is important to have someone help care for you until you are awake and alert.  Rest as told. Do not drive or operate machinery. Do not drink alcohol or take sleeping pills.  Get help right away if you have trouble breathing, or if you suddenly become confused. This information is not intended to replace advice given to you by your health care provider. Make sure you discuss any questions you have with your health care provider. Document Revised: 11/06/2019 Document Reviewed: 01/23/2019 Elsevier Patient Education  2021 [place].    Colon Polyps  Colon polyps are tissue growths inside the colon,  which is part of the large intestine. They are one of the types of polyps that can grow in the body. A polyp may be a round bump or a mushroom-shaped growth. You could have one polyp or more than one. Most colon polyps are noncancerous (benign). However, some colon polyps can become cancerous over time. Finding and removing the polyps early can help prevent this. What are the causes? The exact cause of colon polyps is not known. What increases the risk? The following factors may make you more likely to develop this condition:  Having a family history of colorectal cancer or colon polyps.  Being older than 67 years of age.  Being younger than 67 years of age and having a significant family history of colorectal cancer or colon polyps or a genetic condition that puts you at higher risk of getting colon polyps.  Having inflammatory bowel disease, such as ulcerative colitis or Crohn's disease.  Having certain conditions passed from parent to child (hereditary conditions), such as: ? Familial adenomatous polyposis (FAP). ? Lynch syndrome. ? Turcot syndrome. ? Peutz-Jeghers syndrome. ? MUTYH-associated polyposis (MAP).  Being overweight.  Certain lifestyle factors. These include smoking cigarettes, drinking too much alcohol, not getting enough exercise, and eating a diet that is high in fat and red meat and low in fiber.  Having had childhood cancer that was treated with radiation of the abdomen. What are the signs or symptoms? Many times, there are no symptoms. If you have symptoms, they may include:  Blood coming from the rectum during a bowel movement.  Blood in the stool (feces). The blood may be bright red or very dark in color.  Pain in the abdomen.  A change in bowel habits, such as constipation or diarrhea. How is this diagnosed? This condition is diagnosed with a colonoscopy. This is a procedure in which a lighted, flexible scope is inserted into the opening between the  buttocks (anus) and then passed into the colon to examine the area. Polyps are sometimes found when a colonoscopy is done as part of routine cancer screening tests. How is this treated? This condition is treated by removing any polyps that are found. Most polyps can be removed during a colonoscopy. Those polyps will then be tested for cancer. Additional treatment may be needed depending on the results of testing. Follow these instructions at home: Eating and drinking  Eat foods that are high in fiber, such as fruits, vegetables, and whole grains.  Eat foods that are high in calcium and vitamin D, such  as milk, cheese, yogurt, eggs, liver, fish, and broccoli.  Limit foods that are high in fat, such as fried foods and desserts.  Limit the amount of red meat, precooked or cured meat, or other processed meat that you eat, such as hot dogs, sausages, bacon, or meat loaves.  Limit sugary drinks.   Lifestyle  Maintain a healthy weight, or lose weight if recommended by your health care provider.  Exercise every day or as told by your health care provider.  Do not use any products that contain nicotine or tobacco, such as cigarettes, e-cigarettes, and chewing tobacco. If you need help quitting, ask your health care provider.  Do not drink alcohol if: ? Your health care provider tells you not to drink. ? You are pregnant, may be pregnant, or are planning to become pregnant.  If you drink alcohol: ? Limit how much you use to:  0-1 drink a day for women.  0-2 drinks a day for men. ? Know how much alcohol is in your drink. In the [place] one drink equals one 12 oz bottle of beer (355 mL), one 5 oz glass of wine (148 mL), or one 1 oz glass of hard liquor (44 mL). General instructions  Take over-the-counter and prescription medicines only as told by your health care provider.  Keep all follow-up visits. This is important. This includes having regularly scheduled colonoscopies. Talk to your  health care provider about when you need a colonoscopy. Contact a health care provider if:  You have new or worsening bleeding during a bowel movement.  You have new or increased blood in your stool.  You have a change in bowel habits.  You lose weight for no known reason. Summary  Colon polyps are tissue growths inside the colon, which is part of the large intestine. They are one type of polyp that can grow in the body.  Most colon polyps are noncancerous (benign), but some can become cancerous over time.  This condition is diagnosed with a colonoscopy.  This condition is treated by removing any polyps that are found. Most polyps can be removed during a colonoscopy. This information is not intended to replace advice given to you by your health care provider. Make sure you discuss any questions you have with your health care provider. Document Revised: 06/11/2019 Document Reviewed: 06/11/2019 Elsevier Patient Education  2021 [employer].

## 2020-06-14 NOTE — Anesthesia Postprocedure Evaluation (Signed)
Anesthesia Post Note  Patient: TESIA LYBRAND  Procedure(s) Performed: COLONOSCOPY WITH PROPOFOL (N/A ) ESOPHAGOGASTRODUODENOSCOPY (EGD) WITH PROPOFOL (N/A ) BIOPSY POLYPECTOMY INTESTINAL  Patient location during evaluation: PACU Anesthesia Type: General Level of consciousness: awake and alert and oriented Pain management: pain level controlled Vital Signs Assessment: post-procedure vital signs reviewed and stable Respiratory status: spontaneous breathing Cardiovascular status: blood pressure returned to baseline and stable Postop Assessment: no apparent nausea or vomiting Anesthetic complications: no   No complications documented.   Last Vitals:  Vitals:   06/14/20 1123 06/14/20 1331  BP: 129/78 124/63  Pulse: (!) 112 96  Resp: 18 15  Temp: 37.1 C 37.2 C  SpO2: 93% 98%    Last Pain:  Vitals:   06/14/20 1331  TempSrc: Oral  PainSc: 8                  Terease Marcotte

## 2020-06-14 NOTE — Transfer of Care (Signed)
Immediate Anesthesia Transfer of Care Note  Patient: Jasmine [name]  Procedure(s) Performed: COLONOSCOPY WITH PROPOFOL (N/A ) ESOPHAGOGASTRODUODENOSCOPY (EGD) WITH PROPOFOL (N/A ) BIOPSY POLYPECTOMY INTESTINAL  Patient Location: Short Stay  Anesthesia Type:General  Level of Consciousness: awake  Airway & Oxygen Therapy: Patient Spontanous Breathing  Post-op Assessment: Report given to RN  Post vital signs: Reviewed  Last Vitals:  Vitals Value Taken Time  BP    Temp    Pulse    Resp    SpO2      Last Pain:  Vitals:   06/14/20 1238  TempSrc:   PainSc: 8       Patients Stated Pain Goal: 8 (65/78/46 9629)  Complications: No complications documented.

## 2020-06-14 NOTE — Anesthesia Preprocedure Evaluation (Addendum)
Anesthesia Evaluation  Patient identified by MRN, date of birth, ID band Patient awake    Reviewed: Allergy & Precautions, NPO status , Patient's Chart, lab work & pertinent test results  History of Anesthesia Complications (+) PONV and history of anesthetic complications  Airway Mallampati: II  TM Distance: >3 FB Neck ROM: Full    Dental  (+) Dental Advisory Given, Missing   Pulmonary shortness of breath and Long-Term Oxygen Therapy, asthma , pneumonia, COPD,  COPD inhaler, Current Smoker,  Right lung cancer   Pulmonary exam normal breath sounds clear to auscultation       Cardiovascular Exercise Tolerance: Poor hypertension, Pt. on medications Normal cardiovascular exam+ Valvular Problems/Murmurs  Rhythm:Regular Rate:Normal     Neuro/Psych  Headaches, Seizures -,  Anxiety  Neuromuscular disease    GI/Hepatic GERD  Medicated and Poorly Controlled,(+)     substance abuse  ,   Endo/Other  negative endocrine ROS  Renal/GU negative Renal ROS     Musculoskeletal  (+) Arthritis  (Lumbar fusion, sciatica , back pain), narcotic dependent  Abdominal   Peds  Hematology  (+) anemia ,   Anesthesia Other Findings Spinal cord stimulator  Reproductive/Obstetrics                           Anesthesia Physical Anesthesia Plan  ASA: IV  Anesthesia Plan: General   Post-op Pain Management:    Induction: Intravenous  PONV Risk Score and Plan: Propofol infusion  Airway Management Planned: Nasal Cannula and Natural Airway  Additional Equipment:   Intra-op Plan:   Post-operative Plan:   Informed Consent: I have reviewed the patients History and Physical, chart, labs and discussed the procedure including the risks, benefits and alternatives for the proposed anesthesia with the patient or authorized representative who has indicated his/her understanding and acceptance.     Dental advisory  given  Plan Discussed with: CRNA and Surgeon  Anesthesia Plan Comments:         Anesthesia Quick Evaluation

## 2020-06-14 NOTE — Op Note (Signed)
[facility] Patient Name: Jasmine [name] Procedure Date: 06/14/2020 11:47 AM MRN: [id number] Date of Birth: 09/03/1955 Attending MD: Norvel Richards , MD CSN: [id number] Age: 65 Admit Type: Outpatient Procedure:                Upper GI endoscopy Indications:              Suspected gastro-esophageal reflux disease Providers:                Norvel Richards, MD, Crystal Page, Aram Candela Referring MD:              Medicines:                Propofol per Anesthesia Complications:            No immediate complications. Estimated Blood Loss:     Estimated blood loss was minimal. Estimated blood                            loss was minimal. Procedure:                Pre-Anesthesia Assessment:                           - Prior to the procedure, a History and Physical                            was performed, and patient medications and                            allergies were reviewed. The patient's tolerance of                            previous anesthesia was also reviewed. The risks                            and benefits of the procedure and the sedation                            options and risks were discussed with the patient.                            All questions were answered, and informed consent                            was obtained. Prior Anticoagulants: The patient has                            taken no previous anticoagulant or antiplatelet                            agents. ASA Grade Assessment: III - A patient with                            severe systemic disease. After reviewing the risks  and benefits, the patient was deemed in                            satisfactory condition to undergo the procedure.                           After obtaining informed consent, the endoscope was                            passed under direct vision. Throughout the                            procedure, the patient's blood pressure, pulse, and                             oxygen saturations were monitored continuously. The                            [id number]-H190 ([id number]) scope was introduced through the                            mouth, and advanced to the second part of duodenum.                            The upper GI endoscopy was accomplished without                            difficulty. The patient tolerated the procedure                            well. The upper GI endoscopy was accomplished                            without difficulty. The patient tolerated the                            procedure well. Scope In: 12:45:51 PM Scope Out: 12:50:23 PM Total Procedure Duration: 0 hours 4 minutes 32 seconds  Findings:      The examined esophagus was normal.      Antral erosions were found; marked erythema of the antrum -no ulcer or       infiltrating process seen. This was biopsied with a cold forceps for       histology. Estimated blood loss was minimal.      The duodenal bulb and second portion of the duodenum were normal. Impression:               - Normal esophagus.                           - Erosive gastropathy -predominantly antrum. Marked                            erythema. No ulcer or infiltrating process. Patent  pylorus. Antrum biopsied                           - Normal duodenal bulb and second portion of the                            duodenum. Moderate Sedation:      Moderate (conscious) sedation was personally administered by an       anesthesia professional. The following parameters were monitored: oxygen       saturation, heart rate, blood pressure, respiratory rate, EKG, adequacy       of pulmonary ventilation, and response to care. Recommendation:           - Patient has a contact number available for                            emergencies. The signs and symptoms of potential                            delayed complications were discussed with the                             patient. Return to normal activities tomorrow.                            Written discharge instructions were provided to the                            patient.                           - Resume previous diet.                           - Continue present medications. Continue omeprazole                            to continue omeprazole twice daily                           - Return to my office in 1 month. See colonoscopy                            report. Procedure Code(s):        --- Professional ---                           [phone], Esophagogastroduodenoscopy, flexible,                            transoral; with biopsy, single or multiple Diagnosis Code(s):        --- Professional ---                           K31.89, Other diseases of stomach and duodenum CPT copyright 2019 American Medical Association. All rights reserved. The codes documented in this report are preliminary and  upon coder review may  be revised to meet current compliance requirements. Cristopher Estimable. Courtenay Hirth, MD Norvel Richards, MD 06/14/2020 1:31:36 PM This report has been signed electronically. Number of Addenda: 0

## 2020-06-14 NOTE — H&P (Signed)
'@LOGO' @   Primary Care Physician:  Janora Norlander, DO Primary Gastroenterologist:  Dr. Gala Romney  Pre-Procedure History & Physical: HPI:  Jasmine [name] is a 65 y.o. female here for first ever average risk screening colonoscopy and further evaluation of longstanding GERD via EGD.  No dysphagia. Reflux better on omeprazole 40 mg twice daily.  Past Medical History:  Diagnosis Date  . Allergy   . Anemia   . Anxiety   . Arthritis   . Asthma   . Carpal tunnel syndrome   . Chronic back pain   . Cigarette smoker 08/25/2017  . Complication of anesthesia    in the past has had N/V, the last few surgeries she has not been  . COPD (chronic obstructive pulmonary disease) ([place])   . Easy bruising 07/10/2016  . GERD (gastroesophageal reflux disease)   . Headache   . Heart murmur 2005   never had any problems  . History of blood transfusion 2018  . History of kidney stones   . Hypertension   . Hypoglycemia   . Hypoglycemia   . Iron deficiency anemia 07/11/2016  . Neuropathy    both arms  . Normocytic anemia 07/29/2015  . Pancreatitis   . Pneumonia   . PONV (postoperative nausea and vomiting)   . Postoperative anemia due to acute blood loss 11/15/2015  . R lung cancer   . Sciatica   . Seizures ([place])    as a child when she would have an asthma attack. none as an adult  . Shortness of breath dyspnea     Past Surgical History:  Procedure Laterality Date  . ABDOMINAL HYSTERECTOMY  2005  . APPLICATION OF ROBOTIC ASSISTANCE FOR SPINAL PROCEDURE N/A 09/05/2016   Procedure: APPLICATION OF ROBOTIC ASSISTANCE FOR SPINAL PROCEDURE;  Surgeon: Consuella Lose, MD;  Location: [facility];  Service: Neurosurgery;  Laterality: N/A;  . CERVICAL FUSION    . [place]  . COLONOSCOPY     unsure last  . cyst removal face    . disk repair     neck and lumbar region  . FOOT SURGERY Bilateral    to file bones down   . HARDWARE REMOVAL N/A 09/05/2016   Procedure: HARDWARE REMOVAL AND REPLACEMENT  OF LUMBAR FIVE SCREWS;  Surgeon: Consuella Lose, MD;  Location: [facility];  Service: Neurosurgery;  Laterality: N/A;  . IMPLANTATION / PLACEMENT EPIDURAL NEUROSTIMULATOR ELECTRODES    . LAMINECTOMY     2006  . LUMBAR FUSION    . SPINAL CORD STIMULATOR BATTERY EXCHANGE    . SPINAL CORD STIMULATOR INSERTION N/A 07/05/2016   Procedure: REPLACEMENT OF LUMBAR SPINAL CORD STIMULATOR BATTERY;  Surgeon: Newman Pies, MD;  Location: [facility];  Service: Neurosurgery;  Laterality: N/A;  . TONSILLECTOMY      Prior to Admission medications   Medication Sig Start Date End Date Taking? Authorizing Provider  acetaminophen (TYLENOL) 325 MG tablet Take 2 tablets (650 mg total) by mouth every 6 (six) hours as needed for mild pain, fever or headache (or Fever >/= 101). 01/10/19  Yes Emokpae, Courage, MD  albuterol (PROAIR HFA) 108 (90 Base) MCG/ACT inhaler USE 2 INHALATIONS EVERY 6 HOURS AS NEEDED FOR WHEEZING OR SHORTNESS OF BREATH (NEED TO BE SEEN BEFORE NEXT REFILL) 09/09/19  Yes Gottschalk, Ashly M, DO  amLODipine (NORVASC) 10 MG tablet Take 1 tablet (10 mg total) by mouth daily. 08/20/19  Yes Gottschalk, Leatrice Jewels M, DO  atorvastatin (LIPITOR) 40 MG tablet Take 1 tablet (  40 mg total) by mouth daily. 08/20/19  Yes Gottschalk, Ashly M, DO  budesonide-formoterol (SYMBICORT) 160-4.5 MCG/ACT inhaler USE 2 INHALATIONS TWICE A DAY Patient taking differently: Inhale 2 puffs into the lungs in the morning and at bedtime. USE 2 INHALATIONS TWICE A DAY 05/05/20  Yes Gottschalk, Ashly M, DO  cetirizine (ZYRTEC) 10 MG tablet Take 10 mg by mouth daily as needed for allergies. Reported on 03/12/2015   Yes [provider]  Cholecalciferol 125 MCG (5000 UT) capsule Take 5,000 Units daily by mouth.   Yes [provider]  diclofenac sodium (VOLTAREN) 1 % GEL Apply 1 application topically daily as needed for muscle pain. 06/30/16  Yes [provider]  DULoxetine (CYMBALTA) 30 MG capsule Take 3 capsules (90 mg total) by  mouth daily. 08/20/19  Yes Gottschalk, Leatrice Jewels M, DO  feeding supplement, ENSURE ENLIVE, (ENSURE ENLIVE) LIQD Take 237 mLs by mouth 2 (two) times daily between meals. 07/04/17  Yes Nita Sells, MD  guaiFENesin (MUCINEX) 600 MG 12 hr tablet Take 600 mg by mouth 2 (two) times daily.   Yes [provider]  HYDROcodone Bitartrate ER 60 MG T24A Take 1 tablet by mouth daily.   Yes [provider]  HYDROcodone-acetaminophen (NORCO) 7.5-325 MG tablet Take 1 tablet by mouth 4 (four) times daily.   Yes [provider]  ipratropium-albuterol (DUONEB) 0.5-2.5 (3) MG/3ML SOLN Inhale 3 mLs into the lungs 4 (four) times daily as needed (for shortness of breath/wheezing). *May use as needed 02/21/16  Yes [provider]  Iron Polysacch Cmplx-B12-FA (POLY-IRON 150 FORTE) 150-0.025-1 MG CAPS Take 1 capsule by mouth 2 (two) times daily. 05/27/20  Yes Derek Jack, MD  L-Methylfolate-Algae-B12-B6 [facility]) 3-90.314-2-35 MG CAPS TAKE 1 TABLET BY MOUTH TWO TIMES A DAY Patient taking differently: Take 1 tablet by mouth daily. 09/10/19  Yes Gottschalk, Leatrice Jewels M, DO  magnesium oxide (MAG-OX) 400 (241.3 Mg) MG tablet Take 1 tablet by mouth 2 (two) times daily. 02/07/20  Yes [provider]  omeprazole (PRILOSEC) 40 MG capsule Take 1 capsule (40 mg total) by mouth 2 (two) times daily. Take 1 capsule 30 mins before breakfast and evening meal. 04/27/20  Yes Gill, Eric A, NP  ondansetron (ZOFRAN-ODT) 8 MG disintegrating tablet Take 8 mg by mouth 3 (three) times daily. 01/10/20  Yes [provider]  OXYGEN 2lpm with sleep and as needed during the day   Yes [provider]  Tiotropium Bromide Monohydrate (SPIRIVA RESPIMAT) 2.5 MCG/ACT AERS Inhale 2 puffs into the lungs daily. 09/09/19  Yes Tanda Rockers, MD  tizanidine (ZANAFLEX) 6 MG capsule Take 6 mg by mouth 3 (three) times daily. 11/27/16  Yes [provider]  traZODone (DESYREL) 150 MG tablet TAKE 1 TO 2  TABLETS AT BEDTIME Patient taking differently: Take 150-300 mg by mouth at bedtime. 02/02/20  Yes Gottschalk, Ashly M, DO  Na Sulfate-K Sulfate-Mg Sulf (SUPREP BOWEL PREP KIT) 17.5-3.13-1.6 GM/177ML SOLN Take 1 kit by mouth as directed. Patient not taking: No sig reported 04/22/20   Daneil Dolin, MD    Allergies as of 04/22/2020 - Review Complete 04/22/2020  Allergen Reaction Noted  . Lyrica [pregabalin] Other (See Comments) 06/29/2016  . Darvon [propoxyphene]  02/22/2015  . Gabapentin  08/31/2016  . Augmentin [amoxicillin-pot clavulanate] Nausea And Vomiting 03/19/2015  . Erythromycin Itching and Rash 02/22/2015  . Vibramycin [doxycycline calcium] Itching and Rash 02/22/2015    Family History  Problem Relation Age of Onset  . Heart disease Mother   .  Diabetes Mother   . Hypertension Mother   . Hyperlipidemia Mother   . COPD Mother        smoked  . Cancer Mother        bile duct  . COPD Father   . Diabetes Father   . Heart disease Father   . Cancer Father        throat  . Dementia Father   . Asthma Son   . Hypertension Sister   . Hyperlipidemia Sister   . Hypertension Brother   . Hyperlipidemia Brother   . Hypertension Brother   . Hyperlipidemia Brother   . Emphysema Maternal Grandmother        smoked  . Colon cancer Neg Hx   . Colon polyps Neg Hx     Social History   Socioeconomic History  . Marital status: Married    Spouse name: [name]  . Number of children: 1  . Years of education: Not on file  . Highest education level: Some college, no degree  Occupational History  . Not on file  Tobacco Use  . Smoking status: Current Every Day Smoker    Packs/day: 0.50    Years: 35.00    Pack years: 17.50    Types: Cigarettes  . Smokeless tobacco: Never Used  . Tobacco comment: trying to quit  Vaping Use  . Vaping Use: Never used  Substance and Sexual Activity  . Alcohol use: Yes    Alcohol/week: 2.0 standard drinks    Types: 2 Glasses of wine per week     Comment: occasionally  . Drug use: No  . Sexual activity: Not on file  Other Topics Concern  . Not on file  Social History Narrative   Retired, 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 1 son, deceased. 2 pet dogs. Enjoys watching TV.   Social Determinants of Health   Financial Resource Strain: Not on file  Food Insecurity: Not on file  Transportation Needs: Not on file  Physical Activity: Not on file  Stress: Not on file  Social Connections: Not on file  Intimate Partner Violence: Not At Risk  . Fear of Current or Ex-Partner: No  . Emotionally Abused: No  . Physically Abused: No  . Sexually Abused: No    Review of Systems: See HPI, otherwise negative ROS  Physical Exam: BP 129/78   Pulse (!) 112   Temp 98.8 F (37.1 C) (Oral)   Resp 18   SpO2 93%  General:   Alert,  Well-developed, well-nourished, pleasant and cooperative in NAD Neck:  Supple; no masses or thyromegaly. No significant cervical adenopathy. Lungs:  Clear throughout to auscultation.   No wheezes, crackles, or rhonchi. No acute distress. Heart:  Regular rate and rhythm; no murmurs, clicks, rubs,  or gallops. Abdomen: Non-distended, normal bowel sounds.  Soft and nontender without appreciable mass or hepatosplenomegaly.  Pulses:  Normal pulses noted. Extremities:  Without clubbing or edema.  Impression/Plan: 66 year old lady here for first ever average risk screening colonoscopy.  Also, longstanding GERD.  No dysphagia.  Here for screening EGD as well.  I have offered the patient both an EGD and a colonoscopy  The risks, benefits, limitations, imponderables and alternatives regarding both EGD and colonoscopy have been reviewed with the patient. Questions have been answered. All parties agreeable.       Notice: This dictation was prepared with Dragon dictation along with smaller phrase technology. Any transcriptional errors that result from this process are unintentional  and may not be corrected  upon review.

## 2020-06-14 NOTE — Op Note (Signed)
[facility] Patient Name: Jasmine [name] Procedure Date: 06/14/2020 12:52 PM MRN: [id number] Date of Birth: 27-Dec-1955 Attending MD: Norvel Richards , MD CSN: [id number] Age: 65 Admit Type: Outpatient Procedure:                Colonoscopy Indications:              Screening for colorectal malignant neoplasm Providers:                Norvel Richards, MD, Crystal Page, Aram Candela Referring MD:              Medicines:                Propofol per Anesthesia Complications:            No immediate complications. Estimated Blood Loss:     Estimated blood loss was minimal. Estimated blood                            loss was minimal. Procedure:                Pre-Anesthesia Assessment:                           - Prior to the procedure, a History and Physical                            was performed, and patient medications and                            allergies were reviewed. The patient's tolerance of                            previous anesthesia was also reviewed. The risks                            and benefits of the procedure and the sedation                            options and risks were discussed with the patient.                            All questions were answered, and informed consent                            was obtained. Prior Anticoagulants: The patient has                            taken no previous anticoagulant or antiplatelet                            agents. ASA Grade Assessment: III - A patient with                            severe systemic disease. After reviewing the risks  and benefits, the patient was deemed in                            satisfactory condition to undergo the procedure.                           After obtaining informed consent, the colonoscope                            was passed under direct vision. Throughout the                            procedure, the patient's blood pressure, pulse, and                             oxygen saturations were monitored continuously. The                            [id number]-H190DL ([id number]) scope was introduced through                            the anus and advanced to the the cecum, identified                            by appendiceal orifice and ileocecal valve. The                            colonoscopy was performed without difficulty. The                            patient tolerated the procedure well. The quality                            of the bowel preparation was adequate. Scope In: 12:55:11 PM Scope Out: 1:26:12 PM Scope Withdrawal Time: 0 hours 13 minutes 25 seconds  Total Procedure Duration: 0 hours 31 minutes 1 second  Findings:      The perianal and digital rectal examinations were normal.      Four semi-pedunculated polyps were found in the splenic flexure and       hepatic flexure. The polyps were 6 to 9 mm in size. These polyps were       removed with a cold snare. Resection and retrieval were complete.       Estimated blood loss was minimal.      A 20 mm polyp was found in the hepatic flexure. The polyp was       multi-lobulated. The polyp was removed with a hot snare. Resection and       retrieval were complete. Estimated blood loss: none.      The exam was otherwise without abnormality on direct and retroflexion       views. Impression:               - Four 6 to 9 mm polyps at the splenic flexure and  at the hepatic flexure, removed with a cold snare.                            Resected and retrieved.                           - One 20 mm polyp at the hepatic flexure, removed                            with a hot snare. Resected and retrieved.                           - The examination was otherwise normal on direct                            and retroflexion views. Moderate Sedation:      Moderate (conscious) sedation was personally administered by an       anesthesia professional. The following  parameters were monitored: oxygen       saturation, heart rate, blood pressure, respiratory rate, EKG, adequacy       of pulmonary ventilation, and response to care. Recommendation:           - Patient has a contact number available for                            emergencies. The signs and symptoms of potential                            delayed complications were discussed with the                            patient. Return to normal activities tomorrow.                            Written discharge instructions were provided to the                            patient.                           - Advance diet as tolerated.                           - Continue present medications.                           - Repeat colonoscopy date to be determined after                            pending pathology results are reviewed for                            surveillance.                           - Return to GI office in  1 month. See EGD report. Procedure Code(s):        --- Professional ---                           [phone], Colonoscopy, flexible; with removal of                            tumor(s), polyp(s), or other lesion(s) by snare                            technique Diagnosis Code(s):        --- Professional ---                           K63.5, Polyp of colon                           Z12.11, Encounter for screening for malignant                            neoplasm of colon CPT copyright 2019 American Medical Association. All rights reserved. The codes documented in this report are preliminary and upon coder review may  be revised to meet current compliance requirements. Cristopher Estimable. Kelty Szafran, MD Norvel Richards, MD 06/14/2020 1:35:40 PM This report has been signed electronically. Number of Addenda: 0

## 2020-06-16 ENCOUNTER — Encounter: Payer: Self-pay | Admitting: Gastroenterology

## 2020-06-16 ENCOUNTER — Encounter (HOSPITAL_COMMUNITY): Payer: Self-pay

## 2020-06-16 ENCOUNTER — Other Ambulatory Visit: Payer: Self-pay

## 2020-06-16 ENCOUNTER — Encounter (HOSPITAL_COMMUNITY)
Admission: RE | Admit: 2020-06-16 | Discharge: 2020-06-16 | Disposition: A | Discharge status: Home / Self Care | Payer: Medicare Other | Source: Ambulatory Visit | Attending: Thoracic Surgery (Cardiothoracic Vascular Surgery) | Admitting: Thoracic Surgery (Cardiothoracic Vascular Surgery)

## 2020-06-16 VITALS — BP 124/60 | HR 97 | Ht 63.0 in | Wt 97.7 lb

## 2020-06-16 DIAGNOSIS — R0600 Dyspnea, unspecified: Secondary | ICD-10-CM | POA: Diagnosis present

## 2020-06-16 DIAGNOSIS — J45909 Unspecified asthma, uncomplicated: Secondary | ICD-10-CM

## 2020-06-16 NOTE — Progress Notes (Signed)
Cardiac/Pulmonary Rehab Medication Review by a Pharmacist  Does the patient  feel that his/her medications are working for him/her?  yes  Has the patient been experiencing any side effects to the medications prescribed?  no  Does the patient measure his/her own blood pressure or blood glucose at home?  yes   Does the patient have any problems obtaining medications due to transportation or finances?   no  Understanding of regimen: good Understanding of indications: good Potential of compliance: good  Questions asked to Determine Patient Understanding of Medication Regimen:  1. What is the name of the medication?  2. What is the medication used for?  3. When should it be taken?  4. How much should be taken?  5. How will you take it?  6. What side effects should you report?  Understanding Defined as: Excellent: All questions above are correct Good: Questions 1-4 are correct Fair: Questions 1-2 are correct  Poor: 1 or none of the above questions are correct   Pharmacist comments: patient presents for pulmonary rehab. We reviewed her medications. She is compliant with her current regimen and tolerating. She does monitor her BP at home. She has continued to smoke but she is trying to quit. She has smoked for 45 years and is finding it hard to stop. She is currently using nicorette gum and patches. Discussed changing routine and trying to have a "friend" that helps with this change.  Thanks for the opportunity to participate in the care of this patient,  Isac Sarna, BS [place], [place] Pharmacist Pager [phone] 06/16/2020 1:53 PM

## 2020-06-16 NOTE — Progress Notes (Signed)
Pulmonary Individual Treatment Plan  Patient Details  Name: Jasmine [name] MRN: [id number] Date of Birth: 1956/03/06 Referring Provider:   Flowsheet Row PULMONARY REHAB OTHER RESP ORIENTATION from 06/16/2020 in [facility]  Referring Provider Roxan Hockey      Initial Encounter Date:  Flowsheet Row PULMONARY REHAB OTHER RESP ORIENTATION from 06/16/2020 in [facility]  Date 06/16/20      Visit Diagnosis: Dyspnea, unspecified type  Asthma, unspecified asthma severity, unspecified whether complicated, unspecified whether persistent  Patient's Home Medications on Admission:   Current Outpatient Medications:  .  acetaminophen (TYLENOL) 325 MG tablet, Take 2 tablets (650 mg total) by mouth every 6 (six) hours as needed for mild pain, fever or headache (or Fever >/= 101)., Disp: 12 tablet, Rfl: 2 .  albuterol (PROAIR HFA) 108 (90 Base) MCG/ACT inhaler, USE 2 INHALATIONS EVERY 6 HOURS AS NEEDED FOR WHEEZING OR SHORTNESS OF BREATH (NEED TO BE SEEN BEFORE NEXT REFILL), Disp: 25.5 g, Rfl: 1 .  amLODipine (NORVASC) 10 MG tablet, Take 1 tablet (10 mg total) by mouth daily., Disp: 90 tablet, Rfl: 1 .  atorvastatin (LIPITOR) 40 MG tablet, Take 1 tablet (40 mg total) by mouth daily., Disp: 90 tablet, Rfl: 1 .  budesonide-formoterol (SYMBICORT) 160-4.5 MCG/ACT inhaler, USE 2 INHALATIONS TWICE A DAY (Patient taking differently: Inhale 2 puffs into the lungs in the morning and at bedtime. USE 2 INHALATIONS TWICE A DAY), Disp: 20.4 g, Rfl: 2 .  cetirizine (ZYRTEC) 10 MG tablet, Take 10 mg by mouth daily as needed for allergies. Reported on 03/12/2015, Disp: , Rfl:  .  Cholecalciferol 125 MCG (5000 UT) capsule, Take 5,000 Units daily by mouth., Disp: , Rfl:  .  diclofenac sodium (VOLTAREN) 1 % GEL, Apply 1 application topically daily as needed for muscle pain., Disp: , Rfl:  .  DULoxetine (CYMBALTA) 30 MG capsule, Take 3 capsules (90 mg total) by mouth daily., Disp:  270 capsule, Rfl: 4 .  feeding supplement, ENSURE ENLIVE, (ENSURE ENLIVE) LIQD, Take 237 mLs by mouth 2 (two) times daily between meals., Disp: 237 mL, Rfl: 12 .  guaiFENesin (MUCINEX) 600 MG 12 hr tablet, Take 600 mg by mouth 2 (two) times daily., Disp: , Rfl:  .  HYDROcodone Bitartrate ER 60 MG T24A, Take 1 tablet by mouth daily., Disp: , Rfl:  .  HYDROcodone-acetaminophen (NORCO) 7.5-325 MG tablet, Take 1 tablet by mouth 4 (four) times daily., Disp: , Rfl:  .  ipratropium-albuterol (DUONEB) 0.5-2.5 (3) MG/3ML SOLN, Inhale 3 mLs into the lungs 4 (four) times daily as needed (for shortness of breath/wheezing). *May use as needed, Disp: , Rfl:  .  Iron Polysacch Cmplx-B12-FA (POLY-IRON 150 FORTE) 150-0.025-1 MG CAPS, Take 1 capsule by mouth 2 (two) times daily. (Patient taking differently: Take 1 capsule by mouth daily.), Disp: 60 capsule, Rfl: 2 .  L-Methylfolate-Algae-B12-B6 (METANX) 3-90.314-2-35 MG CAPS, TAKE 1 TABLET BY MOUTH TWO TIMES A DAY (Patient taking differently: Take 1 tablet by mouth daily.), Disp: 180 capsule, Rfl: 4 .  magnesium oxide (MAG-OX) 400 (241.3 Mg) MG tablet, Take 1 tablet by mouth 2 (two) times daily., Disp: , Rfl:  .  Na Sulfate-K Sulfate-Mg Sulf (SUPREP BOWEL PREP KIT) 17.5-3.13-1.6 GM/177ML SOLN, Take 1 kit by mouth as directed. (Patient not taking: No sig reported), Disp: 354 mL, Rfl: 0 .  omeprazole (PRILOSEC) 40 MG capsule, Take 1 capsule (40 mg total) by mouth 2 (two) times daily. Take 1 capsule 30 mins before breakfast and  evening meal., Disp: 90 capsule, Rfl: 3 .  ondansetron (ZOFRAN-ODT) 8 MG disintegrating tablet, Take 8 mg by mouth every 8 (eight) hours as needed., Disp: , Rfl:  .  OXYGEN, 2lpm with sleep and as needed during the day, Disp: , Rfl:  .  Tiotropium Bromide Monohydrate (SPIRIVA RESPIMAT) 2.5 MCG/ACT AERS, Inhale 2 puffs into the lungs daily., Disp: 12 g, Rfl: 3 .  tizanidine (ZANAFLEX) 6 MG capsule, Take 6 mg by mouth 3 (three) times daily., Disp: ,  Rfl:  .  traZODone (DESYREL) 150 MG tablet, TAKE 1 TO 2 TABLETS AT BEDTIME (Patient taking differently: Take 150-300 mg by mouth at bedtime.), Disp: 180 tablet, Rfl: 3  Past Medical History: Past Medical History:  Diagnosis Date  . Allergy   . Anemia   . Anxiety   . Arthritis   . Asthma   . Carpal tunnel syndrome   . Chronic back pain   . Cigarette smoker 08/25/2017  . Complication of anesthesia    in the past has had N/V, the last few surgeries she has not been  . COPD (chronic obstructive pulmonary disease) ( [place])   . Easy bruising 07/10/2016  . GERD (gastroesophageal reflux disease)   . Headache   . Heart murmur 2005   never had any problems  . History of blood transfusion 2018  . History of kidney stones   . Hypertension   . Hypoglycemia   . Hypoglycemia   . Iron deficiency anemia 07/11/2016  . Neuropathy    both arms  . Normocytic anemia 07/29/2015  . Pancreatitis   . Pneumonia   . PONV (postoperative nausea and vomiting)   . Postoperative anemia due to acute blood loss 11/15/2015  . R lung cancer   . Sciatica   . Seizures ([place])    as a child when she would have an asthma attack. none as an adult  . Shortness of breath dyspnea     Tobacco Use: Social History   Tobacco Use  Smoking Status Current Every Day Smoker  . Packs/day: 0.50  . Years: 35.00  . Pack years: 17.50  . Types: Cigarettes  Smokeless Tobacco Never Used  Tobacco Comment   trying to quit    Labs: Recent Review Flowsheet Data    Labs for ITP Cardiac and Pulmonary Rehab Latest Ref Rng & Units 02/22/2015 06/30/2017 10/29/2017 01/06/2019 02/06/2020   Cholestrol 0 - 200 mg/dL 258(H) - 247(H) 180 265(H)   LDLCALC 0 - 99 mg/dL 151(H) - 139(H) 77 NOT CALCULATED   HDL >40 mg/dL 92 - 89 85 >135   Trlycerides <150 mg/dL 74 - 95 88 84   PHART 7.350 - 7.450 - 7.321(L) - - -   PCO2ART 32.0 - 48.0 mmHg - 34.6 - - -   HCO3 20.0 - 28.0 mmol/L - 18.3(L) - - -   O2SAT % - 89.9 - - -      Capillary Blood  Glucose: Lab Results  Component Value Date   GLUCAP 162 (H) 01/09/2019   GLUCAP 149 (H) 01/09/2019   GLUCAP 152 (H) 01/09/2019   GLUCAP 98 01/09/2019   GLUCAP 86 01/09/2019     Pulmonary Assessment Scores:  Pulmonary Assessment Scores    Row Name 06/16/20 1443         ADL UCSD   ADL Phase Entry     SOB Score total 103     Rest 2     Walk 3     Stairs 5  Bath 4     Dress 5     Shop 5           CAT Score   CAT Score 34           mMRC Score   mMRC Score 3           UCSD: Self-administered rating of dyspnea associated with activities of daily living (ADLs) 6-point scale (0 = "not at all" to 5 = "maximal or unable to do because of breathlessness")  Scoring Scores range from 0 to 120.  Minimally important difference is 5 units  CAT: CAT can identify the health impairment of COPD patients and is better correlated with disease progression.  CAT has a scoring range of zero to 40. The CAT score is classified into four groups of low (less than 10), medium (10 - 20), high (21-30) and very high (31-40) based on the impact level of disease on health status. A CAT score over 10 suggests significant symptoms.  A worsening CAT score could be explained by an exacerbation, poor medication adherence, poor inhaler technique, or progression of COPD or comorbid conditions.  CAT MCID is 2 points  mMRC: mMRC (Modified Medical Research Council) Dyspnea Scale is used to assess the degree of baseline functional disability in patients of respiratory disease due to dyspnea. No minimal important difference is established. A decrease in score of 1 point or greater is considered a positive change.   Pulmonary Function Assessment:   Exercise Target Goals: Exercise Program Goal: Individual exercise prescription set using results from initial 6 min walk test and THRR while considering  patient's activity barriers and safety.   Exercise Prescription Goal: Initial exercise prescription  builds to 30-45 minutes a day of aerobic activity, 2-3 days per week.  Home exercise guidelines will be given to patient during program as part of exercise prescription that the participant will acknowledge.  Activity Barriers & Risk Stratification:  Activity Barriers & Cardiac Risk Stratification - 06/16/20 1316      Activity Barriers & Cardiac Risk Stratification   Activity Barriers Arthritis;Back Problems;Neck/Spine Problems;Deconditioning;Joint Problems;Shortness of Breath;Balance Concerns;History of Falls;Assistive Device    Cardiac Risk Stratification Moderate           6 Minute Walk:  6 Minute Walk    Row Name 06/16/20 1424         6 Minute Walk   Phase Initial     Distance 700 feet     Walk Time 6 minutes     # of Rest Breaks 0     MPH 1.3     METS 2.63     RPE 11     Perceived Dyspnea  11     VO2 Peak 9.19     Symptoms No     Resting HR 97 bpm     Resting BP 124/60     Resting Oxygen Saturation  90 %     Exercise Oxygen Saturation  during 6 min walk 90 %     Max Ex. HR 112 bpm     Max Ex. BP 130/64     2 Minute Post BP 124/62           Interval HR   1 Minute HR 106     2 Minute HR 110     3 Minute HR 110     4 Minute HR 112     5 Minute HR 111     6 Minute HR 112  2 Minute Post HR 99     Interval Heart Rate? Yes           Interval Oxygen   Interval Oxygen? Yes     Baseline Oxygen Saturation % 90 %     1 Minute Oxygen Saturation % 91 %     1 Minute Liters of Oxygen 0 L     2 Minute Oxygen Saturation % 90 %     2 Minute Liters of Oxygen 0 L     3 Minute Oxygen Saturation % 90 %     3 Minute Liters of Oxygen 0 L     4 Minute Oxygen Saturation % 92 %     4 Minute Liters of Oxygen 0 L     5 Minute Oxygen Saturation % 91 %     5 Minute Liters of Oxygen 0 L     6 Minute Oxygen Saturation % 91 %     6 Minute Liters of Oxygen 0 L     2 Minute Post Oxygen Saturation % 93 %     2 Minute Post Liters of Oxygen 0 L            Oxygen Initial  Assessment:  Oxygen Initial Assessment - 06/16/20 1428      Home Oxygen   Home Oxygen Device Home Concentrator;Portable Concentrator    Sleep Oxygen Prescription Continuous    Liters per minute 2    Home Exercise Oxygen Prescription None    Home Resting Oxygen Prescription --   Patient uses O2 at home at 2 L at bedtime and when sleeping during the day and prn with exertion.   Compliance with Home Oxygen Use Yes      Initial 6 min Walk   Oxygen Used None      Program Oxygen Prescription   Program Oxygen Prescription None      Intervention   Short Term Goals To learn and exhibit compliance with exercise, home and travel O2 prescription;To learn and understand importance of monitoring SPO2 with pulse oximeter and demonstrate accurate use of the pulse oximeter.;To learn and understand importance of maintaining oxygen saturations>88%;To learn and demonstrate proper pursed lip breathing techniques or other breathing techniques.;To learn and demonstrate proper use of respiratory medications    Long  Term Goals Exhibits compliance with exercise, home and travel O2 prescription;Verbalizes importance of monitoring SPO2 with pulse oximeter and return demonstration;Maintenance of O2 saturations>88%;Exhibits proper breathing techniques, such as pursed lip breathing or other method taught during program session;Compliance with respiratory medication;Demonstrates proper use of MDI's           Oxygen Re-Evaluation:   Oxygen Discharge (Final Oxygen Re-Evaluation):   Initial Exercise Prescription:  Initial Exercise Prescription - 06/16/20 1400      Date of Initial Exercise RX and Referring Provider   Date 06/16/20    Referring Provider Roxan Hockey    Expected Discharge Date 10/19/20      NuStep   Level 1    SPM 60    Minutes 22      Arm Ergometer   Level 1    RPM 50    Minutes 17      Prescription Details   Frequency (times per week) 2    Duration Progress to 30 minutes of  continuous aerobic without signs/symptoms of physical distress      Intensity   THRR 40-80% of Max Heartrate 62-123    Ratings of Perceived Exertion 11-13    Perceived  Dyspnea 0-4      Progression   Progression Continue to progress workloads to maintain intensity without signs/symptoms of physical distress.      Resistance Training   Training Prescription Yes    Weight 2    Reps 10-15           Perform Capillary Blood Glucose checks as needed.  Exercise Prescription Changes:   Exercise Comments:   Exercise Goals and Review:   Exercise Goals    Row Name 06/16/20 1427             Exercise Goals   Increase Physical Activity Yes       Intervention Develop an individualized exercise prescription for aerobic and resistive training based on initial evaluation findings, risk stratification, comorbidities and participant's personal goals.;Provide advice, education, support and counseling about physical activity/exercise needs.       Expected Outcomes Short Term: Attend rehab on a regular basis to increase amount of physical activity.;Long Term: Add in home exercise to make exercise part of routine and to increase amount of physical activity.;Long Term: Exercising regularly at least 3-5 days a week.       Increase Strength and Stamina Yes       Intervention Provide advice, education, support and counseling about physical activity/exercise needs.;Develop an individualized exercise prescription for aerobic and resistive training based on initial evaluation findings, risk stratification, comorbidities and participant's personal goals.       Expected Outcomes Short Term: Increase workloads from initial exercise prescription for resistance, speed, and METs.;Short Term: Perform resistance training exercises routinely during rehab and add in resistance training at home;Long Term: Improve cardiorespiratory fitness, muscular endurance and strength as measured by increased METs and functional  capacity (6MWT)       Able to understand and use rate of perceived exertion (RPE) scale Yes       Intervention Provide education and explanation on how to use RPE scale       Expected Outcomes Short Term: Able to use RPE daily in rehab to express subjective intensity level;Long Term:  Able to use RPE to guide intensity level when exercising independently       Able to understand and use Dyspnea scale Yes       Intervention Provide education and explanation on how to use Dyspnea scale       Expected Outcomes Short Term: Able to use Dyspnea scale daily in rehab to express subjective sense of shortness of breath during exertion;Long Term: Able to use Dyspnea scale to guide intensity level when exercising independently       Knowledge and understanding of Target Heart Rate Range (THRR) Yes       Intervention Provide education and explanation of THRR including how the numbers were predicted and where they are located for reference       Expected Outcomes Short Term: Able to state/look up THRR;Long Term: Able to use THRR to govern intensity when exercising independently;Short Term: Able to use daily as guideline for intensity in rehab       Able to check pulse independently Yes       Intervention Provide education and demonstration on how to check pulse in carotid and radial arteries.;Review the importance of being able to check your own pulse for safety during independent exercise       Expected Outcomes Short Term: Able to explain why pulse checking is important during independent exercise;Long Term: Able to check pulse independently and accurately  Understanding of Exercise Prescription Yes       Intervention Provide education, explanation, and written materials on patient's individual exercise prescription       Expected Outcomes Short Term: Able to explain program exercise prescription;Long Term: Able to explain home exercise prescription to exercise independently              Exercise Goals  Re-Evaluation :   Discharge Exercise Prescription (Final Exercise Prescription Changes):   Nutrition:  Target Goals: Understanding of nutrition guidelines, daily intake of sodium <1519m, cholesterol <2089m calories 30% from fat and 7% or less from saturated fats, daily to have 5 or more servings of fruits and vegetables.  Biometrics:  Pre Biometrics - 06/16/20 1427      Pre Biometrics   Height 5' 3" (1.6 m)    Weight 44.3 kg    Waist Circumference 28.5 inches    Hip Circumference 32 inches    Waist to Hip Ratio 0.89 %    BMI (Calculated) 17.3    Triceps Skinfold 14 mm    % Body Fat 27.8 %    Grip Strength 15.6 kg    Flexibility 0 in    Single Leg Stand 0 seconds            Nutrition Therapy Plan and Nutrition Goals:  Nutrition Therapy & Goals - 06/16/20 1428      Personal Nutrition Goals   Comments Patient scored 27 on her diet assessment. Score explained and handout provided and discussed regarding making healthier choices. Patient says she and her husband both cook and try to eat healthy. We provide 2 educational sessions on heart healthy nutrition and offer RD referral.      Intervention Plan   Intervention Nutrition handout(s) given to patient.           Nutrition Assessments:  Nutrition Assessments - 06/16/20 1431      MEDFICTS Scores   Pre Score 27          MEDIFICTS Score Key:  ?70 Need to make dietary changes   40-70 Heart Healthy Diet  ? 40 Therapeutic Level Cholesterol Diet   Picture Your Plate Scores:  <4<53nhealthy dietary pattern with much room for improvement.  41-50 Dietary pattern unlikely to meet recommendations for good health and room for improvement.  51-60 More healthful dietary pattern, with some room for improvement.   >60 Healthy dietary pattern, although there may be some specific behaviors that could be improved.    Nutrition Goals Re-Evaluation:   Nutrition Goals Discharge (Final Nutrition Goals  Re-Evaluation):   Psychosocial: Target Goals: Acknowledge presence or absence of significant depression and/or stress, maximize coping skills, provide positive support system. Participant is able to verbalize types and ability to use techniques and skills needed for reducing stress and depression.  Initial Review & Psychosocial Screening:  Initial Psych Review & Screening - 06/16/20 1445      Initial Review   Current issues with Current Anxiety/Panic;Current Stress Concerns;Current Sleep Concerns    Source of Stress Concerns Unable to perform yard/household activities;Chronic Illness    Comments Patient rates her stress level at an 8/10 naming her overall declining health and not being strong enough to have a lung nodule surgically removed as her main stress concerns today.      Family Dynamics   Good Support System? Yes    Comments Patient lives with her husband of 19 years and says he is her only support but that he is very supportative. He  is present with her today.      Barriers   Psychosocial barriers to participate in program There are no identifiable barriers or psychosocial needs.      Screening Interventions   Interventions Encouraged to exercise    Expected Outcomes Short Term goal: Utilizing psychosocial counselor, staff and physician to assist with identification of specific Stressors or current issues interfering with healing process. Setting desired goal for each stressor or current issue identified.;Short Term goal: Identification and review with participant of any Quality of Life or Depression concerns found by scoring the questionnaire.;Long Term goal: The participant improves quality of Life and PHQ9 Scores as seen by post scores and/or verbalization of changes           Quality of Life Scores:  Quality of Life - 06/16/20 1428      Quality of Life   Select Quality of Life      Quality of Life Scores   Health/Function Pre 3.77 %    Socioeconomic Pre 7.6 %     Psych/Spiritual Pre 14.75 %    Family Pre 10.63 %    GLOBAL Pre 7.52 %          Scores of 19 and below usually indicate a poorer quality of life in these areas.  A difference of  2-3 points is a clinically meaningful difference.  A difference of 2-3 points in the total score of the Quality of Life Index has been associated with significant improvement in overall quality of life, self-image, physical symptoms, and general health in studies assessing change in quality of life.   PHQ-9: Recent Review Flowsheet Data    Depression screen [facility] 2/9 06/16/2020 03/29/2020 03/10/2020 08/20/2019 10/29/2017   Decreased Interest 0 0 0 0 1   Down, Depressed, Hopeless 1 0 0 0 0   PHQ - 2 Score 1 0 0 0 1   Altered sleeping 2 - 0 0 -   Tired, decreased energy 3 - 0 0 -   Change in appetite 1 - 0 0 -   Feeling bad or failure about yourself  1 - 0 0 -   Trouble concentrating 2 - 0 0 -   Moving slowly or fidgety/restless 2 - 0 0 -   Suicidal thoughts 0 - 0 0 -   PHQ-9 Score 12 - 0 0 -   Difficult doing work/chores Very difficult - - - -     Interpretation of Total Score  Total Score Depression Severity:  1-4 = Minimal depression, 5-9 = Mild depression, 10-14 = Moderate depression, 15-19 = Moderately severe depression, 20-27 = Severe depression   Psychosocial Evaluation and Intervention:  Psychosocial Evaluation - 06/16/20 1446      Psychosocial Evaluation & Interventions   Interventions Stress management education;Relaxation education;Encouraged to exercise with the program and follow exercise prescription    Comments Patient's initial QOL score was 7.52% overall scoreing lowest in health/function at 3.77%. Her PHQ-9 score was 12. She denies any depression or anxiety but does have a history of anxiety and is on cymbalta which she says is for her back pain and is a sleep aide. She also takes trazodone to help her sleep. She rates her stress level at an 8 due to her recent diagnoses of a lung nodule that  needs to be surgically removed but she is not strong enough. This is her main goal for doing PR is to get strong enough for surgery. She also says she hates not being able  to do anything and not being able to help her husband clean the house. She mainly answered all questions today with a yes or no and was rushed. Her husband had another appointment and she was concerned about him being able to make the appointment.    Expected Outcomes Patient's anxiety, stress concerns, and sleep will be managed with no additional psychosocial issues identified.    Continue Psychosocial Services  No Follow up required           Psychosocial Re-Evaluation:   Psychosocial Discharge (Final Psychosocial Re-Evaluation):    Education: Education Goals: Education classes will be provided on a weekly basis, covering required topics. Participant will state understanding/return demonstration of topics presented.  Learning Barriers/Preferences:  Learning Barriers/Preferences - 06/16/20 1432      Learning Barriers/Preferences   Learning Barriers None    Learning Preferences Written Material           Education Topics: How Lungs Work and Diseases: - Discuss the anatomy of the lungs and diseases that can affect the lungs, such as COPD.   Exercise: -Discuss the importance of exercise, FITT principles of exercise, normal and abnormal responses to exercise, and how to exercise safely.   Environmental Irritants: -Discuss types of environmental irritants and how to limit exposure to environmental irritants.   Meds/Inhalers and oxygen: - Discuss respiratory medications, definition of an inhaler and oxygen, and the proper way to use an inhaler and oxygen.   Energy Saving Techniques: - Discuss methods to conserve energy and decrease shortness of breath when performing activities of daily living.    Bronchial Hygiene / Breathing Techniques: - Discuss breathing mechanics, pursed-lip breathing technique,   proper posture, effective ways to clear airways, and other functional breathing techniques   Cleaning Equipment: - Provides group verbal and written instruction about the health risks of elevated stress, cause of high stress, and healthy ways to reduce stress.   Nutrition I: Fats: - Discuss the types of cholesterol, what cholesterol does to the body, and how cholesterol levels can be controlled.   Nutrition II: Labels: -Discuss the different components of food labels and how to read food labels.   Respiratory Infections: - Discuss the signs and symptoms of respiratory infections, ways to prevent respiratory infections, and the importance of seeking medical treatment when having a respiratory infection.   Stress I: Signs and Symptoms: - Discuss the causes of stress, how stress may lead to anxiety and depression, and ways to limit stress.   Stress II: Relaxation: -Discuss relaxation techniques to limit stress.   Oxygen for Home/Travel: - Discuss how to prepare for travel when on oxygen and proper ways to transport and store oxygen to ensure safety.   Knowledge Questionnaire Score:  Knowledge Questionnaire Score - 06/16/20 1432      Knowledge Questionnaire Score   Pre Score 12/18           Core Components/Risk Factors/Patient Goals at Admission:  Personal Goals and Risk Factors at Admission - 06/16/20 1457      Core Components/Risk Factors/Patient Goals on Admission    Weight Management Weight Maintenance    Tobacco Cessation Yes    Number of packs per day 1/2 ppd    Intervention Assist the participant in steps to quit. Provide individualized education and counseling about committing to Tobacco Cessation, relapse prevention, and pharmacological support that can be provided by physician.    Improve shortness of breath with ADL's Yes    Intervention Provide education, individualized exercise plan and  daily activity instruction to help decrease symptoms of SOB with  activities of daily living.    Expected Outcomes Short Term: Improve cardiorespiratory fitness to achieve a reduction of symptoms when performing ADLs;Long Term: Be able to perform more ADLs without symptoms or delay the onset of symptoms    Stress Yes    Intervention Offer individual and/or small group education and counseling on adjustment to heart disease, stress management and health-related lifestyle change. Teach and support self-help strategies.;Refer participants experiencing significant psychosocial distress to appropriate mental health specialists for further evaluation and treatment. When possible, include family members and significant others in education/counseling sessions.    Expected Outcomes Short Term: Participant demonstrates changes in health-related behavior, relaxation and other stress management skills, ability to obtain effective social support, and compliance with psychotropic medications if prescribed.;Long Term: Emotional wellbeing is indicated by absence of clinically significant psychosocial distress or social isolation.    Personal Goal Other Yes    Personal Goal Get strong enough to have lung nodule surgery; have less SOB with walking; be able to help her husband with cleaning house.    Intervention Provide pulmonary rehab program with patient supplemental exercise at home.    Expected Outcomes Patient will complete the program meeting both personal and program goals.           Core Components/Risk Factors/Patient Goals Review:    Core Components/Risk Factors/Patient Goals at Discharge (Final Review):    ITP Comments:   Comments: Patient arrived for 1st visit/orientation/education at 1230. Patient was referred to CR by Modesto Charon, MD due to Dyspnea (R06.66) and Asthma (J45.909). During orientation advised patient on arrival and appointment times what to wear, what to do before, during and after exercise. Reviewed attendance and class policy.  Pt is  scheduled to return Cardiac Rehab on 06/22/20 at 1:30. Pt was advised to come to class 15 minutes before class starts.  Discussed RPE/Dpysnea scales. Patient participated in warm up stretches. Patient was able to complete 6 minute walk test.  Patient was measured for the equipment. Discussed equipment safety with patient. Took patient pre-anthropometric measurements. Patient finished visit at 1415.

## 2020-06-17 ENCOUNTER — Encounter (HOSPITAL_COMMUNITY): Payer: Self-pay | Admitting: Internal Medicine

## 2020-06-17 LAB — SURGICAL PATHOLOGY

## 2020-06-18 ENCOUNTER — Other Ambulatory Visit: Payer: Self-pay | Admitting: Family Medicine

## 2020-06-21 ENCOUNTER — Encounter: Payer: Self-pay | Admitting: Family Medicine

## 2020-06-21 ENCOUNTER — Ambulatory Visit (INDEPENDENT_AMBULATORY_CARE_PROVIDER_SITE_OTHER): Payer: Medicare Other | Admitting: Thoracic Surgery (Cardiothoracic Vascular Surgery)

## 2020-06-21 ENCOUNTER — Other Ambulatory Visit: Payer: Self-pay

## 2020-06-21 ENCOUNTER — Encounter: Payer: Self-pay | Admitting: Thoracic Surgery (Cardiothoracic Vascular Surgery)

## 2020-06-21 VITALS — BP 112/68 | HR 110 | Resp 20 | Ht 63.0 in | Wt 98.8 lb

## 2020-06-21 DIAGNOSIS — R911 Solitary pulmonary nodule: Secondary | ICD-10-CM | POA: Diagnosis not present

## 2020-06-21 MED ORDER — AMLODIPINE BESYLATE 10 MG PO TABS: 1.0000 | ORAL_TABLET | Freq: Every day | ORAL | 0 refills | Status: DC

## 2020-06-21 NOTE — Progress Notes (Signed)
[address]       [place] [address]             [phone]    HPI: [name] returns to discuss the results of her studies.  Jasmine [name] is a 65 year old woman with a history of asthma, COPD, tobacco abuse, reflux, hypertension, hyperglycemia, anemia, anxiety, chronic pain, sciatica, neuropathy, right foot drop, pancreatitis, pneumonia, and seizures as a child.  She was being evaluated for dyspnea and had a CT scan which showed 1.5 x 1.5 x 1.7 cm spiculated right upper lobe lung nodule adjacent to and retracting the minor fissure.  There was a small subpleural nodule in the middle lobe along the fissure.  Also there was mention of enlarged main pulmonary arteries and the report.  She had a PET/CT which showed the right upper lobe nodule was hypermetabolic with an SUV of 5.5 with no evidence of regional or distant metastatic disease.  I saw her in the office on 05/06/2020.  She was unable to do well with a 6-minute walk test.  I think that was mostly due to her foot drop and other issues rather than getting out of breath.  She has started pulmonary rehab.  She had an echocardiogram and now returns to discuss the results of that study and further discuss management of her lung nodule.  Past Medical History:  Diagnosis Date  . Allergy   . Anemia   . Anxiety   . Arthritis   . Asthma   . Carpal tunnel syndrome   . Chronic back pain   . Cigarette smoker 08/25/2017  . Complication of anesthesia    in the past has had N/V, the last few surgeries she has not been  . COPD (chronic obstructive pulmonary disease) ([place])   . Easy bruising 07/10/2016  . GERD (gastroesophageal reflux disease)   . Headache   . Heart murmur 2005   never had any problems  . History of blood transfusion 2018  . History of kidney stones   . Hypertension   . Hypoglycemia   . Hypoglycemia   . Iron deficiency anemia 07/11/2016  . Neuropathy    both arms  . Normocytic anemia 07/29/2015  .  Pancreatitis   . Pneumonia   . PONV (postoperative nausea and vomiting)   . Postoperative anemia due to acute blood loss 11/15/2015  . R lung cancer   . Sciatica   . Seizures ([place])    as a child when she would have an asthma attack. none as an adult  . Shortness of breath dyspnea      Current Outpatient Medications  Medication Sig Dispense Refill  . acetaminophen (TYLENOL) 325 MG tablet Take 2 tablets (650 mg total) by mouth every 6 (six) hours as needed for mild pain, fever or headache (or Fever >/= 101). 12 tablet 2  . albuterol (PROAIR HFA) 108 (90 Base) MCG/ACT inhaler USE 2 INHALATIONS EVERY 6 HOURS AS NEEDED FOR WHEEZING OR SHORTNESS OF BREATH (NEED TO BE SEEN BEFORE NEXT REFILL) 25.5 g 1  . amLODipine (NORVASC) 10 MG tablet Take 1 tablet (10 mg total) by mouth daily. 90 tablet 0  . atorvastatin (LIPITOR) 40 MG tablet Take 1 tablet (40 mg total) by mouth daily. 90 tablet 1  . budesonide-formoterol (SYMBICORT) 160-4.5 MCG/ACT inhaler USE 2 INHALATIONS TWICE A DAY (Patient taking differently: Inhale 2 puffs into the lungs in the morning and at bedtime. USE 2 INHALATIONS TWICE A DAY)  20.4 g 2  . cetirizine (ZYRTEC) 10 MG tablet Take 10 mg by mouth daily as needed for allergies. Reported on 03/12/2015    . Cholecalciferol 125 MCG (5000 UT) capsule Take 5,000 Units daily by mouth.    . diclofenac sodium (VOLTAREN) 1 % GEL Apply 1 application topically daily as needed for muscle pain.    . DULoxetine (CYMBALTA) 30 MG capsule Take 3 capsules (90 mg total) by mouth daily. 270 capsule 4  . feeding supplement, ENSURE ENLIVE, (ENSURE ENLIVE) LIQD Take 237 mLs by mouth 2 (two) times daily between meals. 237 mL 12  . guaiFENesin (MUCINEX) 600 MG 12 hr tablet Take 600 mg by mouth 2 (two) times daily.    Marland Kitchen HYDROcodone Bitartrate ER 60 MG T24A Take 1 tablet by mouth daily.    Marland Kitchen HYDROcodone-acetaminophen (NORCO) 7.5-325 MG tablet Take 1 tablet by mouth 4 (four) times daily.    Marland Kitchen ipratropium-albuterol  (DUONEB) 0.5-2.5 (3) MG/3ML SOLN Inhale 3 mLs into the lungs 4 (four) times daily as needed (for shortness of breath/wheezing). *May use as needed    . Iron Polysacch Cmplx-B12-FA (POLY-IRON 150 FORTE) 150-0.025-1 MG CAPS Take 1 capsule by mouth 2 (two) times daily. (Patient taking differently: Take 1 capsule by mouth daily.) 60 capsule 2  . L-Methylfolate-Algae-B12-B6 (METANX) 3-90.314-2-35 MG CAPS TAKE 1 TABLET BY MOUTH TWO TIMES A DAY (Patient taking differently: Take 1 tablet by mouth daily.) 180 capsule 4  . magnesium oxide (MAG-OX) 400 (241.3 Mg) MG tablet Take 1 tablet by mouth 2 (two) times daily.    Marland Kitchen omeprazole (PRILOSEC) 40 MG capsule Take 1 capsule (40 mg total) by mouth 2 (two) times daily. Take 1 capsule 30 mins before breakfast and evening meal. 90 capsule 3  . ondansetron (ZOFRAN-ODT) 8 MG disintegrating tablet Take 8 mg by mouth every 8 (eight) hours as needed.    . OXYGEN 2lpm with sleep and as needed during the day    . Tiotropium Bromide Monohydrate (SPIRIVA RESPIMAT) 2.5 MCG/ACT AERS Inhale 2 puffs into the lungs daily. 12 g 3  . tizanidine (ZANAFLEX) 6 MG capsule Take 6 mg by mouth 3 (three) times daily.    . traZODone (DESYREL) 150 MG tablet TAKE 1 TO 2 TABLETS AT BEDTIME (Patient taking differently: Take 150-300 mg by mouth at bedtime.) 180 tablet 3   No current facility-administered medications for this visit.    Physical Exam BP 112/68 (BP Location: Left Arm, Patient Position: Sitting, Cuff Size: Normal)   Pulse (!) 110   Resp 20   Ht 5\' 3"  (1.6 m)   Wt 98 lb 12.8 oz (44.8 kg)   SpO2 94% Comment: RA  BMI 17.44 kg/m  65 year old woman in no acute distress Alert and oriented x3 with no focal deficits Lungs diminished breath sounds bilaterally, no wheezing No cervical or supraclavicular adenopathy Cardiac regular rate and rhythm with a 2/6 systolic murmur  Diagnostic Tests: I again reviewed her CT and PET/CT.  1.7 cm spiculated nodule inferior right upper lobe  anteriorly that is markedly hypermetabolic on PET/CT.  Evidence of emphysema.  No hilar or mediastinal adenopathy or distant metastatic disease.  Echocardiogram 06/02/2020 IMPRESSIONS    1. Left ventricular ejection fraction, by estimation, is 60 to 65%. The  left ventricle has normal function. The left ventricle has no regional  wall motion abnormalities. Left ventricular diastolic parameters are  consistent with Grade I diastolic  dysfunction (impaired relaxation).  2. Right ventricular systolic function is normal. The right  ventricular  size is mildly enlarged.  3. The mitral valve is normal in structure. Mild mitral valve  regurgitation. No evidence of mitral stenosis.  4. The aortic valve has an indeterminant number of cusps. Aortic valve  regurgitation is not visualized. No aortic stenosis is present.  5. Indeterminant PASP, inadequate TR jet.  6. The inferior vena cava is normal in size with greater than 50%  respiratory variability, suggesting right atrial pressure of 3 mmHg.  I reviewed the report from her echocardiogram.  No evidence of pulmonary hypertension by echo.  Normal LV function.  No significant valvular pathology.  Impression: Jasmine [name] is a 65 year old woman with a history of asthma, COPD, tobacco abuse, reflux, hypertension, hyperglycemia, anemia, anxiety, chronic pain, sciatica, neuropathy, right foot drop, pancreatitis, pneumonia, and seizures as a child.  She has been evaluated for dyspnea and was found to have a 1.5 x 1.5 x 1.7 cm spiculated nodule in the right upper lobe.  There is some thickening of the minor fissure and possible noted along the middle lobe adjacent to that.  PET scan showed the lesion was hypermetabolic.  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  We have previously discussed potential treatment with either stereotactic radiation or surgical resection.   She would be relatively high risk for surgery given her comorbidities and emphysema.  The 6-minute walk test was not of much assistance due to her foot drop.  Her PFTs show adequate reserves tolerate an upper lobectomy and her emphysema is most prominent in the upper lobes.  Echo does not show any signs of pulmonary hypertension or right heart strain.  I do think she is potentially a candidate for surgery.  We will definitely still be a high risk procedure given her other issues.  Again I tried to encourage her to consider radiation.  Although not as likely to cure the cancer there is much less risk upfront with that treatment.  Despite that she still wishes to pursue surgical resection.  She has started pulmonary rehab.  That goes through August and we certainly cannot wait that long.  I will plan to see her back in about 3 weeks and we can make a final decision.  She was told again to quit smoking  Plan: Quit smoking Pulmonary rehab Return in 3 weeks  I spent over 20 minutes in review of records, images, and in consultation with Jasmine [name] today. Melrose Nakayama, MD [facility] [phone]

## 2020-06-22 ENCOUNTER — Encounter (HOSPITAL_COMMUNITY)
Admission: RE | Admit: 2020-06-22 | Discharge: 2020-06-22 | Disposition: A | Discharge status: Home / Self Care | Payer: Medicare Other | Source: Ambulatory Visit | Attending: Thoracic Surgery (Cardiothoracic Vascular Surgery) | Admitting: Thoracic Surgery (Cardiothoracic Vascular Surgery)

## 2020-06-22 ENCOUNTER — Other Ambulatory Visit: Payer: Self-pay | Admitting: Family Medicine

## 2020-06-22 ENCOUNTER — Encounter: Payer: Self-pay | Admitting: Internal Medicine

## 2020-06-22 VITALS — Wt 95.9 lb

## 2020-06-22 DIAGNOSIS — R0600 Dyspnea, unspecified: Secondary | ICD-10-CM | POA: Diagnosis not present

## 2020-06-22 DIAGNOSIS — J45909 Unspecified asthma, uncomplicated: Secondary | ICD-10-CM

## 2020-06-22 NOTE — Progress Notes (Signed)
Daily Session Note  Patient Details  Name: Jasmine [name] MRN: [id number] Date of Birth: 04-Oct-1955 Referring Provider:   [place] from 06/16/2020 in [place]  Referring Provider Roxan Hockey      Encounter Date: 06/22/2020  Check In:  Session Check In - 06/22/20 1330      Check-In   Supervising physician immediately available to respond to emergencies [facility] MD immediately available    Physician(s) Dr. Harl Bowie    Location [facility]    Staff Present Geanie Cooley, RN;Madison Audria Nine, MS, Exercise Physiologist;Etosha Wetherell Kris Mouton, MS, ACSM-CEP, Exercise Physiologist    Virtual Visit No    Medication changes reported     No    Fall or balance concerns reported    Yes    Comments Patient has had multiple falls in the past. She has impaired balance and uses a rolator for ambulation.    Tobacco Cessation No Change    Warm-up and Cool-down Performed as group-led instruction    Resistance Training Performed Yes    VAD Patient? No    PAD/SET Patient? No      Pain Assessment   Currently in Pain? Yes    Pain Score 8     Pain Location Back    Pain Orientation Lower    Pain Descriptors / Indicators Aching    Pain Type Chronic pain    Pain Radiating Towards none    Pain Onset More than a month ago    Pain Frequency Constant    Pain Relieving Factors multiple pain medications    Effect of Pain on Daily Activities limits mobility at times    Multiple Pain Sites Yes      2nd Pain Site   Pain Score 6    Pain Location Arm    Pain Orientation Right;Left    Pain Descriptors / Indicators Discomfort    Pain Type Acute pain    Pain Onset Today    Pain Frequency Occasional    Aggravating Factors  with exercise      3rd Pain Site   Pain Score 6    Pain Location Leg    Pain Orientation Right;Left    Pain Descriptors / Indicators Discomfort    Pain Type Acute pain    Pain Onset Today    Pain Frequency  Occasional    Aggravating Factors  with exercise           Capillary Blood Glucose: No results found for this or any previous visit (from the past 24 hour(s)).    Social History   Tobacco Use  Smoking Status Current Every Day Smoker  . Packs/day: 0.50  . Years: 35.00  . Pack years: 17.50  . Types: Cigarettes  Smokeless Tobacco Never Used  Tobacco Comment   trying to quit    Goals Met:  Independence with exercise equipment Exercise tolerated well No report of cardiac concerns or symptoms Strength training completed today  Goals Unmet:  Not Applicable  Comments: checkout time is 1430   Dr. Kathie Dike is Medical Director for [facility] Pulmonary Rehab.

## 2020-06-23 ENCOUNTER — Encounter: Payer: Self-pay | Admitting: Family Medicine

## 2020-06-23 MED ORDER — NICOTINE 7 MG/24HR TD PT24: 7.0000 mg | MEDICATED_PATCH | Freq: Every day | TRANSDERMAL | 0 refills | Status: DC

## 2020-06-23 MED ORDER — NICOTINE 14 MG/24HR TD PT24: 14.0000 mg | MEDICATED_PATCH | Freq: Every day | TRANSDERMAL | 0 refills | Status: DC

## 2020-06-23 NOTE — Telephone Encounter (Signed)
Pts husband says she is smoking 1/2 a pack to 3/4 ppd.

## 2020-06-24 ENCOUNTER — Encounter (HOSPITAL_COMMUNITY)
Admission: RE | Admit: 2020-06-24 | Discharge: 2020-06-24 | Disposition: A | Discharge status: Home / Self Care | Payer: Medicare Other | Source: Ambulatory Visit | Attending: Thoracic Surgery (Cardiothoracic Vascular Surgery) | Admitting: Thoracic Surgery (Cardiothoracic Vascular Surgery)

## 2020-06-24 ENCOUNTER — Other Ambulatory Visit: Payer: Self-pay

## 2020-06-24 DIAGNOSIS — R0600 Dyspnea, unspecified: Secondary | ICD-10-CM | POA: Diagnosis not present

## 2020-06-24 DIAGNOSIS — J45909 Unspecified asthma, uncomplicated: Secondary | ICD-10-CM

## 2020-06-24 NOTE — Progress Notes (Signed)
Daily Session Note  Patient Details  Name: Jasmine [name] MRN: [id number] Date of Birth: Apr 15, 1955 Referring Provider:   [place] from 06/16/2020 in [place]  Referring Provider Roxan Hockey      Encounter Date: 06/24/2020  Check In:  Session Check In - 06/24/20 1330      Check-In   Supervising physician immediately available to respond to emergencies [facility] MD immediately available    Physician(s) Dr. Harl Bowie    Location [facility]    Staff Present Aundra Dubin, RN, BSN;Phyllis Billingsley, RN;Madison Audria Nine, MS, Exercise Physiologist    Virtual Visit No    Medication changes reported     No    Fall or balance concerns reported    Yes    Comments Patient has had multiple falls in the past. She has impaired balance and uses a rolator for ambulation.    Tobacco Cessation No Change    Warm-up and Cool-down Performed as group-led instruction    Resistance Training Performed Yes    VAD Patient? No    PAD/SET Patient? No      Pain Assessment   Currently in Pain? No/denies    Pain Score 0-No pain    Multiple Pain Sites No           Capillary Blood Glucose: No results found for this or any previous visit (from the past 24 hour(s)).    Social History   Tobacco Use  Smoking Status Current Every Day Smoker  . Packs/day: 0.50  . Years: 35.00  . Pack years: 17.50  . Types: Cigarettes  Smokeless Tobacco Never Used  Tobacco Comment   trying to quit    Goals Met:  Proper associated with RPD/PD & O2 Sat Independence with exercise equipment Improved SOB with ADL's Using PLB without cueing & demonstrates good technique Exercise tolerated well No report of cardiac concerns or symptoms Strength training completed today  Goals Unmet:  Not Applicable  Comments: Check out 1430.   Dr. Kathie Dike is Medical Director for [facility] Pulmonary Rehab.

## 2020-06-29 ENCOUNTER — Encounter (HOSPITAL_COMMUNITY)
Admission: RE | Admit: 2020-06-29 | Discharge: 2020-06-29 | Disposition: A | Discharge status: Home / Self Care | Payer: Medicare Other | Source: Ambulatory Visit | Attending: Thoracic Surgery (Cardiothoracic Vascular Surgery) | Admitting: Thoracic Surgery (Cardiothoracic Vascular Surgery)

## 2020-06-29 ENCOUNTER — Other Ambulatory Visit: Payer: Self-pay

## 2020-06-29 DIAGNOSIS — R0600 Dyspnea, unspecified: Secondary | ICD-10-CM | POA: Diagnosis not present

## 2020-06-29 DIAGNOSIS — J45909 Unspecified asthma, uncomplicated: Secondary | ICD-10-CM

## 2020-06-29 NOTE — Progress Notes (Signed)
Daily Session Note  Patient Details  Name: Jasmine [name] MRN: [id number] Date of Birth: March 10, 1955 Referring Provider:   [place] from 06/16/2020 in [place]  Referring Provider Roxan Hockey      Encounter Date: 06/29/2020  Check In:  Session Check In - 06/29/20 1330      Check-In   Supervising physician immediately available to respond to emergencies [facility] MD immediately available    Physician(s) Dr. Harl Bowie    Location [facility]    Staff Present Geanie Cooley, RN;Madison Audria Nine, MS, Exercise Physiologist;Darol Cush Kris Mouton, MS, ACSM-CEP, Exercise Physiologist    Virtual Visit No    Medication changes reported     No    Fall or balance concerns reported    Yes    Comments Patient has had multiple falls in the past. She has impaired balance and uses a rolator for ambulation.    Tobacco Cessation No Change    Warm-up and Cool-down Performed as group-led instruction    Resistance Training Performed Yes    VAD Patient? No    PAD/SET Patient? No      Pain Assessment   Currently in Pain? Yes    Pain Score 7     Pain Location Back    Pain Orientation Lower    Pain Descriptors / Indicators Aching    Pain Type Chronic pain    Pain Onset More than a month ago    Pain Frequency Constant    Pain Relieving Factors multiple pain medications    Effect of Pain on Daily Activities limits mobility at times    Multiple Pain Sites No           Capillary Blood Glucose: No results found for this or any previous visit (from the past 24 hour(s)).    Social History   Tobacco Use  Smoking Status Current Every Day Smoker  . Packs/day: 0.50  . Years: 35.00  . Pack years: 17.50  . Types: Cigarettes  Smokeless Tobacco Never Used  Tobacco Comment   trying to quit    Goals Met:  Independence with exercise equipment Exercise tolerated well No report of cardiac concerns or symptoms Strength  training completed today  Goals Unmet:  Not Applicable  Comments: checkout time is 1430   Dr. Kathie Dike is Medical Director for [facility] Pulmonary Rehab.

## 2020-07-01 ENCOUNTER — Other Ambulatory Visit: Payer: Self-pay

## 2020-07-01 ENCOUNTER — Encounter (HOSPITAL_COMMUNITY)
Admission: RE | Admit: 2020-07-01 | Discharge: 2020-07-01 | Disposition: A | Discharge status: Home / Self Care | Payer: Medicare Other | Source: Ambulatory Visit | Attending: Thoracic Surgery (Cardiothoracic Vascular Surgery) | Admitting: Thoracic Surgery (Cardiothoracic Vascular Surgery)

## 2020-07-01 DIAGNOSIS — R0600 Dyspnea, unspecified: Secondary | ICD-10-CM

## 2020-07-01 DIAGNOSIS — J45909 Unspecified asthma, uncomplicated: Secondary | ICD-10-CM

## 2020-07-01 NOTE — Progress Notes (Signed)
Daily Session Note  Patient Details  Name: Jasmine [name] MRN: [id number] Date of Birth: Oct 18, 1955 Referring Provider:   [place] from 06/16/2020 in [place]  Referring Provider Roxan Hockey      Encounter Date: 07/01/2020  Check In:  Session Check In - 07/01/20 1330      Check-In   Supervising physician immediately available to respond to emergencies [facility] MD immediately available    Physician(s) Dr. Domenic Polite    Location [facility]    Staff Present Cathren Harsh, MS, Exercise Physiologist;Phyllis Billingsley, RN    Virtual Visit No    Medication changes reported     No    Fall or balance concerns reported    Yes    Tobacco Cessation No Change    Warm-up and Cool-down Performed as group-led instruction    Resistance Training Performed Yes    VAD Patient? No    PAD/SET Patient? No      Pain Assessment   Currently in Pain? No/denies    Multiple Pain Sites No           Capillary Blood Glucose: No results found for this or any previous visit (from the past 24 hour(s)).    Social History   Tobacco Use  Smoking Status Current Every Day Smoker  . Packs/day: 0.50  . Years: 35.00  . Pack years: 17.50  . Types: Cigarettes  Smokeless Tobacco Never Used  Tobacco Comment   trying to quit    Goals Met:  Independence with exercise equipment Exercise tolerated well No report of cardiac concerns or symptoms Strength training completed today  Goals Unmet:  Not Applicable  Comments: check out 1430   Dr. Kathie Dike is Medical Director for [facility] Pulmonary Rehab.

## 2020-07-04 HISTORY — PX: OTHER SURGICAL HISTORY: SHX169

## 2020-07-06 ENCOUNTER — Encounter (HOSPITAL_COMMUNITY)
Admission: RE | Admit: 2020-07-06 | Discharge: 2020-07-06 | Disposition: A | Discharge status: Home / Self Care | Payer: Medicare Other | Source: Ambulatory Visit | Attending: Thoracic Surgery (Cardiothoracic Vascular Surgery) | Admitting: Thoracic Surgery (Cardiothoracic Vascular Surgery)

## 2020-07-06 ENCOUNTER — Other Ambulatory Visit: Payer: Self-pay

## 2020-07-06 VITALS — Wt 95.7 lb

## 2020-07-06 DIAGNOSIS — J45909 Unspecified asthma, uncomplicated: Secondary | ICD-10-CM

## 2020-07-06 DIAGNOSIS — R0600 Dyspnea, unspecified: Secondary | ICD-10-CM

## 2020-07-06 NOTE — Progress Notes (Signed)
Daily Session Note  Patient Details  Name: Jasmine [name] MRN: [id number] Date of Birth: 06-25-1955 Referring Provider:   Flowsheet Row PULMONARY REHAB OTHER RESP ORIENTATION from 06/16/2020 in [place]  Referring Provider Roxan Hockey      Encounter Date: 07/06/2020  Check In:  Session Check In - 07/06/20 1330      Check-In   Supervising physician immediately available to respond to emergencies [facility] MD immediately available    Physician(s) Dr. Harl Bowie    Location [facility]    Staff Present Cathren Harsh, MS, Exercise Physiologist;Phyllis Billingsley, Edison Simon, MS, ACSM-CEP, Exercise Physiologist    Virtual Visit No    Medication changes reported     No    Fall or balance concerns reported    No    Comments Patient has had multiple falls in the past. She has impaired balance and uses a rolator for ambulation.    Tobacco Cessation No Change    Warm-up and Cool-down Performed as group-led instruction    Resistance Training Performed Yes    VAD Patient? No    PAD/SET Patient? No      Pain Assessment   Currently in Pain? No/denies    Pain Score 4     Pain Location Back    Pain Orientation Lower    Pain Descriptors / Indicators Aching    Pain Type Chronic pain    Pain Onset More than a month ago    Pain Frequency Constant    Pain Relieving Factors multiple pain medications    Effect of Pain on Daily Activities limits mobility at times    Multiple Pain Sites No           Capillary Blood Glucose: No results found for this or any previous visit (from the past 24 hour(s)).    Social History   Tobacco Use  Smoking Status Current Every Day Smoker  . Packs/day: 0.50  . Years: 35.00  . Pack years: 17.50  . Types: Cigarettes  Smokeless Tobacco Never Used  Tobacco Comment   trying to quit    Goals Met:  Independence with exercise equipment Exercise tolerated well No report of cardiac concerns or symptoms Strength  training completed today  Goals Unmet:  Not Applicable  Comments: checkout time is 1430   Dr. Kathie Dike is Medical Director for [facility] Pulmonary Rehab.

## 2020-07-08 ENCOUNTER — Encounter (HOSPITAL_COMMUNITY)
Admission: RE | Admit: 2020-07-08 | Discharge: 2020-07-08 | Disposition: A | Discharge status: Home / Self Care | Payer: Medicare Other | Source: Ambulatory Visit | Attending: Thoracic Surgery (Cardiothoracic Vascular Surgery) | Admitting: Thoracic Surgery (Cardiothoracic Vascular Surgery)

## 2020-07-08 ENCOUNTER — Other Ambulatory Visit: Payer: Self-pay

## 2020-07-08 DIAGNOSIS — R0600 Dyspnea, unspecified: Secondary | ICD-10-CM

## 2020-07-08 DIAGNOSIS — J45909 Unspecified asthma, uncomplicated: Secondary | ICD-10-CM

## 2020-07-08 NOTE — Progress Notes (Signed)
Pulmonary Individual Treatment Plan  Patient Details  Name: Jasmine [name] MRN: [id number] Date of Birth: 11/07/1955 Referring Provider:   Flowsheet Row PULMONARY REHAB OTHER RESP ORIENTATION from 06/16/2020 in [facility]  Referring Provider Roxan Hockey      Initial Encounter Date:  Flowsheet Row PULMONARY REHAB OTHER RESP ORIENTATION from 06/16/2020 in [facility]  Date 06/16/20      Visit Diagnosis: Dyspnea, unspecified type  Asthma, unspecified asthma severity, unspecified whether complicated, unspecified whether persistent  Patient's Home Medications on Admission:   Current Outpatient Medications:  .  acetaminophen (TYLENOL) 325 MG tablet, Take 2 tablets (650 mg total) by mouth every 6 (six) hours as needed for mild pain, fever or headache (or Fever >/= 101)., Disp: 12 tablet, Rfl: 2 .  albuterol (PROAIR HFA) 108 (90 Base) MCG/ACT inhaler, USE 2 INHALATIONS EVERY 6 HOURS AS NEEDED FOR WHEEZING OR SHORTNESS OF BREATH (NEED TO BE SEEN BEFORE NEXT REFILL), Disp: 25.5 g, Rfl: 1 .  amLODipine (NORVASC) 10 MG tablet, Take 1 tablet (10 mg total) by mouth daily., Disp: 90 tablet, Rfl: 0 .  atorvastatin (LIPITOR) 40 MG tablet, Take 1 tablet (40 mg total) by mouth daily., Disp: 90 tablet, Rfl: 1 .  budesonide-formoterol (SYMBICORT) 160-4.5 MCG/ACT inhaler, USE 2 INHALATIONS TWICE A DAY (Patient taking differently: Inhale 2 puffs into the lungs in the morning and at bedtime. USE 2 INHALATIONS TWICE A DAY), Disp: 20.4 g, Rfl: 2 .  cetirizine (ZYRTEC) 10 MG tablet, Take 10 mg by mouth daily as needed for allergies. Reported on 03/12/2015, Disp: , Rfl:  .  Cholecalciferol 125 MCG (5000 UT) capsule, Take 5,000 Units daily by mouth., Disp: , Rfl:  .  diclofenac sodium (VOLTAREN) 1 % GEL, Apply 1 application topically daily as needed for muscle pain., Disp: , Rfl:  .  DULoxetine (CYMBALTA) 30 MG capsule, Take 3 capsules (90 mg total) by mouth daily., Disp:  270 capsule, Rfl: 4 .  feeding supplement, ENSURE ENLIVE, (ENSURE ENLIVE) LIQD, Take 237 mLs by mouth 2 (two) times daily between meals., Disp: 237 mL, Rfl: 12 .  guaiFENesin (MUCINEX) 600 MG 12 hr tablet, Take 600 mg by mouth 2 (two) times daily., Disp: , Rfl:  .  HYDROcodone Bitartrate ER 60 MG T24A, Take 1 tablet by mouth daily., Disp: , Rfl:  .  HYDROcodone-acetaminophen (NORCO) 7.5-325 MG tablet, Take 1 tablet by mouth 4 (four) times daily., Disp: , Rfl:  .  ipratropium-albuterol (DUONEB) 0.5-2.5 (3) MG/3ML SOLN, Inhale 3 mLs into the lungs 4 (four) times daily as needed (for shortness of breath/wheezing). *May use as needed, Disp: , Rfl:  .  Iron Polysacch Cmplx-B12-FA (POLY-IRON 150 FORTE) 150-0.025-1 MG CAPS, Take 1 capsule by mouth 2 (two) times daily. (Patient taking differently: Take 1 capsule by mouth daily.), Disp: 60 capsule, Rfl: 2 .  L-Methylfolate-Algae-B12-B6 (METANX) 3-90.314-2-35 MG CAPS, TAKE 1 TABLET BY MOUTH TWO TIMES A DAY (Patient taking differently: Take 1 tablet by mouth daily.), Disp: 180 capsule, Rfl: 4 .  magnesium oxide (MAG-OX) 400 (241.3 Mg) MG tablet, Take 1 tablet by mouth 2 (two) times daily., Disp: , Rfl:  .  nicotine (NICODERM CQ) 14 mg/24hr patch, Place 1 patch (14 mg total) onto the skin daily., Disp: 30 patch, Rfl: 0 .  nicotine (NICODERM CQ) 7 mg/24hr patch, Place 1 patch (7 mg total) onto the skin daily., Disp: 60 patch, Rfl: 0 .  omeprazole (PRILOSEC) 40 MG capsule, Take 1 capsule (40 mg  total) by mouth 2 (two) times daily. Take 1 capsule 30 mins before breakfast and evening meal., Disp: 90 capsule, Rfl: 3 .  ondansetron (ZOFRAN-ODT) 8 MG disintegrating tablet, Take 8 mg by mouth every 8 (eight) hours as needed., Disp: , Rfl:  .  OXYGEN, 2lpm with sleep and as needed during the day, Disp: , Rfl:  .  Tiotropium Bromide Monohydrate (SPIRIVA RESPIMAT) 2.5 MCG/ACT AERS, Inhale 2 puffs into the lungs daily., Disp: 12 g, Rfl: 3 .  tizanidine (ZANAFLEX) 6 MG  capsule, Take 6 mg by mouth 3 (three) times daily., Disp: , Rfl:  .  traZODone (DESYREL) 150 MG tablet, TAKE 1 TO 2 TABLETS AT BEDTIME (Patient taking differently: Take 150-300 mg by mouth at bedtime.), Disp: 180 tablet, Rfl: 3  Past Medical History: Past Medical History:  Diagnosis Date  . Allergy   . Anemia   . Anxiety   . Arthritis   . Asthma   . Carpal tunnel syndrome   . Chronic back pain   . Cigarette smoker 08/25/2017  . Complication of anesthesia    in the past has had N/V, the last few surgeries she has not been  . COPD (chronic obstructive pulmonary disease) ([place])   . Easy bruising 07/10/2016  . GERD (gastroesophageal reflux disease)   . Headache   . Heart murmur 2005   never had any problems  . History of blood transfusion 2018  . History of kidney stones   . Hypertension   . Hypoglycemia   . Hypoglycemia   . Iron deficiency anemia 07/11/2016  . Neuropathy    both arms  . Normocytic anemia 07/29/2015  . Pancreatitis   . Pneumonia   . PONV (postoperative nausea and vomiting)   . Postoperative anemia due to acute blood loss 11/15/2015  . R lung cancer   . Sciatica   . Seizures ([place])    as a child when she would have an asthma attack. none as an adult  . Shortness of breath dyspnea     Tobacco Use: Social History   Tobacco Use  Smoking Status Current Every Day Smoker  . Packs/day: 0.50  . Years: 35.00  . Pack years: 17.50  . Types: Cigarettes  Smokeless Tobacco Never Used  Tobacco Comment   trying to quit    Labs: Recent Review Flowsheet Data    Labs for ITP Cardiac and Pulmonary Rehab Latest Ref Rng & Units 02/22/2015 06/30/2017 10/29/2017 01/06/2019 02/06/2020   Cholestrol 0 - 200 mg/dL 258(H) - 247(H) 180 265(H)   LDLCALC 0 - 99 mg/dL 151(H) - 139(H) 77 NOT CALCULATED   HDL >40 mg/dL 92 - 89 85 >135   Trlycerides <150 mg/dL 74 - 95 88 84   PHART 7.350 - 7.450 - 7.321(L) - - -   PCO2ART 32.0 - 48.0 mmHg - 34.6 - - -   HCO3 20.0 - 28.0 mmol/L - 18.3(L) -  - -   O2SAT % - 89.9 - - -      Capillary Blood Glucose: Lab Results  Component Value Date   GLUCAP 162 (H) 01/09/2019   GLUCAP 149 (H) 01/09/2019   GLUCAP 152 (H) 01/09/2019   GLUCAP 98 01/09/2019   GLUCAP 86 01/09/2019     Pulmonary Assessment Scores:  Pulmonary Assessment Scores    Row Name 06/16/20 1443         ADL UCSD   ADL Phase Entry     SOB Score total 103  Rest 2     Walk 3     Stairs 5     Bath 4     Dress 5     Shop 5           CAT Score   CAT Score 34           mMRC Score   mMRC Score 3           UCSD: Self-administered rating of dyspnea associated with activities of daily living (ADLs) 6-point scale (0 = "not at all" to 5 = "maximal or unable to do because of breathlessness")  Scoring Scores range from 0 to 120.  Minimally important difference is 5 units  CAT: CAT can identify the health impairment of COPD patients and is better correlated with disease progression.  CAT has a scoring range of zero to 40. The CAT score is classified into four groups of low (less than 10), medium (10 - 20), high (21-30) and very high (31-40) based on the impact level of disease on health status. A CAT score over 10 suggests significant symptoms.  A worsening CAT score could be explained by an exacerbation, poor medication adherence, poor inhaler technique, or progression of COPD or comorbid conditions.  CAT MCID is 2 points  mMRC: mMRC (Modified Medical Research Council) Dyspnea Scale is used to assess the degree of baseline functional disability in patients of respiratory disease due to dyspnea. No minimal important difference is established. A decrease in score of 1 point or greater is considered a positive change.   Pulmonary Function Assessment:   Exercise Target Goals: Exercise Program Goal: Individual exercise prescription set using results from initial 6 min walk test and THRR while considering  patient's activity barriers and safety.   Exercise  Prescription Goal: Initial exercise prescription builds to 30-45 minutes a day of aerobic activity, 2-3 days per week.  Home exercise guidelines will be given to patient during program as part of exercise prescription that the participant will acknowledge.  Activity Barriers & Risk Stratification:  Activity Barriers & Cardiac Risk Stratification - 06/16/20 1316      Activity Barriers & Cardiac Risk Stratification   Activity Barriers Arthritis;Back Problems;Neck/Spine Problems;Deconditioning;Joint Problems;Shortness of Breath;Balance Concerns;History of Falls;Assistive Device    Cardiac Risk Stratification Moderate           6 Minute Walk:  6 Minute Walk    Row Name 06/16/20 1424         6 Minute Walk   Phase Initial     Distance 700 feet     Walk Time 6 minutes     # of Rest Breaks 0     MPH 1.3     METS 2.63     RPE 11     Perceived Dyspnea  11     VO2 Peak 9.19     Symptoms No     Resting HR 97 bpm     Resting BP 124/60     Resting Oxygen Saturation  90 %     Exercise Oxygen Saturation  during 6 min walk 90 %     Max Ex. HR 112 bpm     Max Ex. BP 130/64     2 Minute Post BP 124/62           Interval HR   1 Minute HR 106     2 Minute HR 110     3 Minute HR 110     4 Minute  HR 112     5 Minute HR 111     6 Minute HR 112     2 Minute Post HR 99     Interval Heart Rate? Yes           Interval Oxygen   Interval Oxygen? Yes     Baseline Oxygen Saturation % 90 %     1 Minute Oxygen Saturation % 91 %     1 Minute Liters of Oxygen 0 L     2 Minute Oxygen Saturation % 90 %     2 Minute Liters of Oxygen 0 L     3 Minute Oxygen Saturation % 90 %     3 Minute Liters of Oxygen 0 L     4 Minute Oxygen Saturation % 92 %     4 Minute Liters of Oxygen 0 L     5 Minute Oxygen Saturation % 91 %     5 Minute Liters of Oxygen 0 L     6 Minute Oxygen Saturation % 91 %     6 Minute Liters of Oxygen 0 L     2 Minute Post Oxygen Saturation % 93 %     2 Minute Post Liters of  Oxygen 0 L            Oxygen Initial Assessment:  Oxygen Initial Assessment - 06/16/20 1428      Home Oxygen   Home Oxygen Device Home Concentrator;Portable Concentrator    Sleep Oxygen Prescription Continuous    Liters per minute 2    Home Exercise Oxygen Prescription None    Home Resting Oxygen Prescription --   Patient uses O2 at home at 2 L at bedtime and when sleeping during the day and prn with exertion.   Compliance with Home Oxygen Use Yes      Initial 6 min Walk   Oxygen Used None      Program Oxygen Prescription   Program Oxygen Prescription None      Intervention   Short Term Goals To learn and exhibit compliance with exercise, home and travel O2 prescription;To learn and understand importance of monitoring SPO2 with pulse oximeter and demonstrate accurate use of the pulse oximeter.;To learn and understand importance of maintaining oxygen saturations>88%;To learn and demonstrate proper pursed lip breathing techniques or other breathing techniques.;To learn and demonstrate proper use of respiratory medications    Long  Term Goals Exhibits compliance with exercise, home and travel O2 prescription;Verbalizes importance of monitoring SPO2 with pulse oximeter and return demonstration;Maintenance of O2 saturations>88%;Exhibits proper breathing techniques, such as pursed lip breathing or other method taught during program session;Compliance with respiratory medication;Demonstrates proper use of MDI's           Oxygen Re-Evaluation:  Oxygen Re-Evaluation    Row Name 07/06/20 1416             Program Oxygen Prescription   Program Oxygen Prescription None               Home Oxygen   Home Oxygen Device Home Concentrator;Portable Concentrator       Sleep Oxygen Prescription Continuous       Liters per minute 2       Home Exercise Oxygen Prescription None       Compliance with Home Oxygen Use Yes               Goals/Expected Outcomes   Short Term Goals To learn and  exhibit compliance  with exercise, home and travel O2 prescription;To learn and understand importance of monitoring SPO2 with pulse oximeter and demonstrate accurate use of the pulse oximeter.;To learn and understand importance of maintaining oxygen saturations>88%;To learn and demonstrate proper pursed lip breathing techniques or other breathing techniques.;To learn and demonstrate proper use of respiratory medications       Long  Term Goals Exhibits compliance with exercise, home and travel O2 prescription;Verbalizes importance of monitoring SPO2 with pulse oximeter and return demonstration;Maintenance of O2 saturations>88%;Exhibits proper breathing techniques, such as pursed lip breathing or other method taught during program session;Compliance with respiratory medication;Demonstrates proper use of MDI's              Oxygen Discharge (Final Oxygen Re-Evaluation):  Oxygen Re-Evaluation - 07/06/20 1416      Program Oxygen Prescription   Program Oxygen Prescription None      Home Oxygen   Home Oxygen Device Home Concentrator;Portable Concentrator    Sleep Oxygen Prescription Continuous    Liters per minute 2    Home Exercise Oxygen Prescription None    Compliance with Home Oxygen Use Yes      Goals/Expected Outcomes   Short Term Goals To learn and exhibit compliance with exercise, home and travel O2 prescription;To learn and understand importance of monitoring SPO2 with pulse oximeter and demonstrate accurate use of the pulse oximeter.;To learn and understand importance of maintaining oxygen saturations>88%;To learn and demonstrate proper pursed lip breathing techniques or other breathing techniques.;To learn and demonstrate proper use of respiratory medications    Long  Term Goals Exhibits compliance with exercise, home and travel O2 prescription;Verbalizes importance of monitoring SPO2 with pulse oximeter and return demonstration;Maintenance of O2 saturations>88%;Exhibits proper breathing  techniques, such as pursed lip breathing or other method taught during program session;Compliance with respiratory medication;Demonstrates proper use of MDI's           Initial Exercise Prescription:  Initial Exercise Prescription - 06/16/20 1400      Date of Initial Exercise RX and Referring Provider   Date 06/16/20    Referring Provider Roxan Hockey    Expected Discharge Date 10/19/20      NuStep   Level 1    SPM 60    Minutes 22      Arm Ergometer   Level 1    RPM 50    Minutes 17      Prescription Details   Frequency (times per week) 2    Duration Progress to 30 minutes of continuous aerobic without signs/symptoms of physical distress      Intensity   THRR 40-80% of Max Heartrate 62-123    Ratings of Perceived Exertion 11-13    Perceived Dyspnea 0-4      Progression   Progression Continue to progress workloads to maintain intensity without signs/symptoms of physical distress.      Resistance Training   Training Prescription Yes    Weight 2    Reps 10-15           Perform Capillary Blood Glucose checks as needed.  Exercise Prescription Changes:   Exercise Prescription Changes    Row Name 06/22/20 1500 07/06/20 1400           Response to Exercise   Blood Pressure (Admit) 122/62 128/76      Blood Pressure (Exercise) 128/60 138/76      Blood Pressure (Exit) 102/58 112/68      Heart Rate (Admit) 99 bpm 113 bpm      Heart Rate (Exercise)  112 bpm 115 bpm      Heart Rate (Exit) 99 bpm 99 bpm      Oxygen Saturation (Admit) 96 % 94 %      Oxygen Saturation (Exercise) 94 % 94 %      Oxygen Saturation (Exit) 95 % 94 %      Rating of Perceived Exertion (Exercise) 13 14      Perceived Dyspnea (Exercise) 15 15      Duration Continue with 30 min of aerobic exercise without signs/symptoms of physical distress. Continue with 30 min of aerobic exercise without signs/symptoms of physical distress.      Intensity THRR unchanged THRR unchanged              Progression   Progression Continue to progress workloads to maintain intensity without signs/symptoms of physical distress. Continue to progress workloads to maintain intensity without signs/symptoms of physical distress.             Resistance Training   Training Prescription Yes Yes      Weight 1 lbs 1 lb      Reps 10-15 10-15      Time 10 Minutes 10 Minutes             NuStep   Level 1 1      SPM 58 74      Minutes 17 17      METs 1.5 1.5             Arm Ergometer   Level 1 1      RPM 54 46      Minutes 17 22      METs 2.5 2.3             Exercise Comments:   Exercise Comments    Row Name 06/22/20 1537           Exercise Comments Patient completed first exercise session today. She tolerated exercise well for the most part. She was very tired after the session. She is very deconditioned, but is excited to keep coming to rehab and getting stronger and more in shape. She is highly motivated.              Exercise Goals and Review:   Exercise Goals    Row Name 06/16/20 1427 07/06/20 1416           Exercise Goals   Increase Physical Activity Yes Yes      Intervention Develop an individualized exercise prescription for aerobic and resistive training based on initial evaluation findings, risk stratification, comorbidities and participant's personal goals.;Provide advice, education, support and counseling about physical activity/exercise needs. Develop an individualized exercise prescription for aerobic and resistive training based on initial evaluation findings, risk stratification, comorbidities and participant's personal goals.;Provide advice, education, support and counseling about physical activity/exercise needs.      Expected Outcomes Short Term: Attend rehab on a regular basis to increase amount of physical activity.;Long Term: Add in home exercise to make exercise part of routine and to increase amount of physical activity.;Long Term: Exercising regularly at  least 3-5 days a week. Short Term: Attend rehab on a regular basis to increase amount of physical activity.;Long Term: Add in home exercise to make exercise part of routine and to increase amount of physical activity.;Long Term: Exercising regularly at least 3-5 days a week.      Increase Strength and Stamina Yes Yes      Intervention Provide advice, education, support and counseling about  physical activity/exercise needs.;Develop an individualized exercise prescription for aerobic and resistive training based on initial evaluation findings, risk stratification, comorbidities and participant's personal goals. Provide advice, education, support and counseling about physical activity/exercise needs.;Develop an individualized exercise prescription for aerobic and resistive training based on initial evaluation findings, risk stratification, comorbidities and participant's personal goals.      Expected Outcomes Short Term: Increase workloads from initial exercise prescription for resistance, speed, and METs.;Short Term: Perform resistance training exercises routinely during rehab and add in resistance training at home;Long Term: Improve cardiorespiratory fitness, muscular endurance and strength as measured by increased METs and functional capacity (6MWT) Short Term: Increase workloads from initial exercise prescription for resistance, speed, and METs.;Short Term: Perform resistance training exercises routinely during rehab and add in resistance training at home;Long Term: Improve cardiorespiratory fitness, muscular endurance and strength as measured by increased METs and functional capacity (6MWT)      Able to understand and use rate of perceived exertion (RPE) scale Yes Yes      Intervention Provide education and explanation on how to use RPE scale Provide education and explanation on how to use RPE scale      Expected Outcomes Short Term: Able to use RPE daily in rehab to express subjective intensity level;Long  Term:  Able to use RPE to guide intensity level when exercising independently Short Term: Able to use RPE daily in rehab to express subjective intensity level;Long Term:  Able to use RPE to guide intensity level when exercising independently      Able to understand and use Dyspnea scale Yes Yes      Intervention Provide education and explanation on how to use Dyspnea scale Provide education and explanation on how to use Dyspnea scale      Expected Outcomes Short Term: Able to use Dyspnea scale daily in rehab to express subjective sense of shortness of breath during exertion;Long Term: Able to use Dyspnea scale to guide intensity level when exercising independently Short Term: Able to use Dyspnea scale daily in rehab to express subjective sense of shortness of breath during exertion;Long Term: Able to use Dyspnea scale to guide intensity level when exercising independently      Knowledge and understanding of Target Heart Rate Range (THRR) Yes Yes      Intervention Provide education and explanation of THRR including how the numbers were predicted and where they are located for reference Provide education and explanation of THRR including how the numbers were predicted and where they are located for reference      Expected Outcomes Short Term: Able to state/look up THRR;Long Term: Able to use THRR to govern intensity when exercising independently;Short Term: Able to use daily as guideline for intensity in rehab Short Term: Able to state/look up THRR;Long Term: Able to use THRR to govern intensity when exercising independently;Short Term: Able to use daily as guideline for intensity in rehab      Able to check pulse independently Yes Yes      Intervention Provide education and demonstration on how to check pulse in carotid and radial arteries.;Review the importance of being able to check your own pulse for safety during independent exercise Provide education and demonstration on how to check pulse in carotid and  radial arteries.;Review the importance of being able to check your own pulse for safety during independent exercise      Expected Outcomes Short Term: Able to explain why pulse checking is important during independent exercise;Long Term: Able to check pulse independently and  accurately Short Term: Able to explain why pulse checking is important during independent exercise;Long Term: Able to check pulse independently and accurately      Understanding of Exercise Prescription Yes Yes      Intervention Provide education, explanation, and written materials on patient's individual exercise prescription Provide education, explanation, and written materials on patient's individual exercise prescription      Expected Outcomes Short Term: Able to explain program exercise prescription;Long Term: Able to explain home exercise prescription to exercise independently Short Term: Able to explain program exercise prescription;Long Term: Able to explain home exercise prescription to exercise independently             Exercise Goals Re-Evaluation :  Exercise Goals Re-Evaluation    Row Name 07/06/20 1417             Exercise Goal Re-Evaluation   Exercise Goals Review Increase Physical Activity;Increase Strength and Stamina;Able to understand and use rate of perceived exertion (RPE) scale;Able to understand and use Dyspnea scale;Knowledge and understanding of Target Heart Rate Range (THRR);Able to check pulse independently;Understanding of Exercise Prescription       Comments Patient has completed 5 exercise sessions. She has tolerated exercise well with no complaints. She is deconditioned but making progress in the program. She is eager to get healthier and stronger. She is very excited about coming to rehab and thinks it is helping her. She is currently exercising at 1.5 METs on the NuStep. Will continue to monitor and progress as able.       Expected Outcomes Through exercise at rehab and at home patient will  achieve their goals.              Discharge Exercise Prescription (Final Exercise Prescription Changes):  Exercise Prescription Changes - 07/06/20 1400      Response to Exercise   Blood Pressure (Admit) 128/76    Blood Pressure (Exercise) 138/76    Blood Pressure (Exit) 112/68    Heart Rate (Admit) 113 bpm    Heart Rate (Exercise) 115 bpm    Heart Rate (Exit) 99 bpm    Oxygen Saturation (Admit) 94 %    Oxygen Saturation (Exercise) 94 %    Oxygen Saturation (Exit) 94 %    Rating of Perceived Exertion (Exercise) 14    Perceived Dyspnea (Exercise) 15    Duration Continue with 30 min of aerobic exercise without signs/symptoms of physical distress.    Intensity THRR unchanged      Progression   Progression Continue to progress workloads to maintain intensity without signs/symptoms of physical distress.      Resistance Training   Training Prescription Yes    Weight 1 lb    Reps 10-15    Time 10 Minutes      NuStep   Level 1    SPM 74    Minutes 17    METs 1.5      Arm Ergometer   Level 1    RPM 46    Minutes 22    METs 2.3           Nutrition:  Target Goals: Understanding of nutrition guidelines, daily intake of sodium 1500mg , cholesterol 200mg , calories 30% from fat and 7% or less from saturated fats, daily to have 5 or more servings of fruits and vegetables.  Biometrics:  Pre Biometrics - 07/06/20 1441      Pre Biometrics   Weight 43.4 kg    BMI (Calculated) 16.95  Nutrition Therapy Plan and Nutrition Goals:  Nutrition Therapy & Goals - 06/28/20 1505      Personal Nutrition Goals   Comments Nutritional handout given to patient on how to choose heart healthy meals.      Intervention Plan   Intervention Nutrition handout(s) given to patient.           Nutrition Assessments:  Nutrition Assessments - 06/16/20 1431      MEDFICTS Scores   Pre Score 27          MEDIFICTS Score Key:  ?70 Need to make dietary changes   40-70  Heart Healthy Diet  ? 40 Therapeutic Level Cholesterol Diet   Picture Your Plate Scores:  <45 Unhealthy dietary pattern with much room for improvement.  41-50 Dietary pattern unlikely to meet recommendations for good health and room for improvement.  51-60 More healthful dietary pattern, with some room for improvement.   >60 Healthy dietary pattern, although there may be some specific behaviors that could be improved.    Nutrition Goals Re-Evaluation:   Nutrition Goals Discharge (Final Nutrition Goals Re-Evaluation):   Psychosocial: Target Goals: Acknowledge presence or absence of significant depression and/or stress, maximize coping skills, provide positive support system. Participant is able to verbalize types and ability to use techniques and skills needed for reducing stress and depression.  Initial Review & Psychosocial Screening:  Initial Psych Review & Screening - 06/16/20 1445      Initial Review   Current issues with Current Anxiety/Panic;Current Stress Concerns;Current Sleep Concerns    Source of Stress Concerns Unable to perform yard/household activities;Chronic Illness    Comments Patient rates her stress level at an 8/10 naming her overall declining health and not being strong enough to have a lung nodule surgically removed as her main stress concerns today.      Family Dynamics   Good Support System? Yes    Comments Patient lives with her husband of 19 years and says he is her only support but that he is very supportative. He is present with her today.      Barriers   Psychosocial barriers to participate in program There are no identifiable barriers or psychosocial needs.      Screening Interventions   Interventions Encouraged to exercise    Expected Outcomes Short Term goal: Utilizing psychosocial counselor, staff and physician to assist with identification of specific Stressors or current issues interfering with healing process. Setting desired goal for each  stressor or current issue identified.;Short Term goal: Identification and review with participant of any Quality of Life or Depression concerns found by scoring the questionnaire.;Long Term goal: The participant improves quality of Life and PHQ9 Scores as seen by post scores and/or verbalization of changes           Quality of Life Scores:  Quality of Life - 06/16/20 1428      Quality of Life   Select Quality of Life      Quality of Life Scores   Health/Function Pre 3.77 %    Socioeconomic Pre 7.6 %    Psych/Spiritual Pre 14.75 %    Family Pre 10.63 %    GLOBAL Pre 7.52 %          Scores of 19 and below usually indicate a poorer quality of life in these areas.  A difference of  2-3 points is a clinically meaningful difference.  A difference of 2-3 points in the total score of the Quality of Life Index has been  associated with significant improvement in overall quality of life, self-image, physical symptoms, and general health in studies assessing change in quality of life.   PHQ-9: Recent Review Flowsheet Data    Depression screen [facility] 2/9 06/16/2020 03/29/2020 03/10/2020 08/20/2019 10/29/2017   Decreased Interest 0 0 0 0 1   Down, Depressed, Hopeless 1 0 0 0 0   PHQ - 2 Score 1 0 0 0 1   Altered sleeping 2 - 0 0 -   Tired, decreased energy 3 - 0 0 -   Change in appetite 1 - 0 0 -   Feeling bad or failure about yourself  1 - 0 0 -   Trouble concentrating 2 - 0 0 -   Moving slowly or fidgety/restless 2 - 0 0 -   Suicidal thoughts 0 - 0 0 -   PHQ-9 Score 12 - 0 0 -   Difficult doing work/chores Very difficult - - - -     Interpretation of Total Score  Total Score Depression Severity:  1-4 = Minimal depression, 5-9 = Mild depression, 10-14 = Moderate depression, 15-19 = Moderately severe depression, 20-27 = Severe depression   Psychosocial Evaluation and Intervention:  Psychosocial Evaluation - 06/16/20 1446      Psychosocial Evaluation & Interventions   Interventions Stress  management education;Relaxation education;Encouraged to exercise with the program and follow exercise prescription    Comments Patient's initial QOL score was 7.52% overall scoreing lowest in health/function at 3.77%. Her PHQ-9 score was 12. She denies any depression or anxiety but does have a history of anxiety and is on cymbalta which she says is for her back pain and is a sleep aide. She also takes trazodone to help her sleep. She rates her stress level at an 8 due to her recent diagnoses of a lung nodule that needs to be surgically removed but she is not strong enough. This is her main goal for doing PR is to get strong enough for surgery. She also says she hates not being able to do anything and not being able to help her husband clean the house. She mainly answered all questions today with a yes or no and was rushed. Her husband had another appointment and she was concerned about him being able to make the appointment.    Expected Outcomes Patient's anxiety, stress concerns, and sleep will be managed with no additional psychosocial issues identified.    Continue Psychosocial Services  No Follow up required           Psychosocial Re-Evaluation:  Psychosocial Re-Evaluation    [place] Name 06/28/20 1447             Psychosocial Re-Evaluation   Current issues with Current Stress Concerns;Current Sleep Concerns       Interventions Encouraged to attend Pulmonary Rehabilitation for the exercise;Relaxation education;Stress management education       Continue Psychosocial Services  No Follow up required       Comments Patient new to the program completing 2 sessisons. She scored 7.52 on QOL. Health causing alot of stress.  Plan to encouage a positiveoutlook and attitude.  Will continue to monitor as she progresses in the program.   Encourage breating technique such as purse lipped breathing. She was referred to pulmonary rehab by Dr. Roxan Hockey with a diagnosis of Dyspnea and asthma.                Initial Review   Source of Stress Concerns Unable to perform yard/household  activities;Chronic Illness              Psychosocial Discharge (Final Psychosocial Re-Evaluation):  Psychosocial Re-Evaluation - 06/28/20 1447      Psychosocial Re-Evaluation   Current issues with Current Stress Concerns;Current Sleep Concerns    Interventions Encouraged to attend Pulmonary Rehabilitation for the exercise;Relaxation education;Stress management education    Continue Psychosocial Services  No Follow up required    Comments Patient new to the program completing 2 sessisons. She scored 7.52 on QOL. Health causing alot of stress.  Plan to encouage a positiveoutlook and attitude.  Will continue to monitor as she progresses in the program.   Encourage breating technique such as purse lipped breathing. She was referred to pulmonary rehab by Dr. Roxan Hockey with a diagnosis of Dyspnea and asthma.      Initial Review   Source of Stress Concerns Unable to perform yard/household activities;Chronic Illness            Education: Education Goals: Education classes will be provided on a weekly basis, covering required topics. Participant will state understanding/return demonstration of topics presented.  Learning Barriers/Preferences:  Learning Barriers/Preferences - 06/16/20 1432      Learning Barriers/Preferences   Learning Barriers None    Learning Preferences Written Material           Education Topics: How Lungs Work and Diseases: - Discuss the anatomy of the lungs and diseases that can affect the lungs, such as COPD.   Exercise: -Discuss the importance of exercise, FITT principles of exercise, normal and abnormal responses to exercise, and how to exercise safely.   Environmental Irritants: -Discuss types of environmental irritants and how to limit exposure to environmental irritants.   Meds/Inhalers and oxygen: - Discuss respiratory medications, definition of an inhaler and oxygen,  and the proper way to use an inhaler and oxygen. Flowsheet Row PULMONARY REHAB OTHER RESPIRATORY from 07/01/2020 in [place]  Date 06/24/20  Educator [facility]      Energy Saving Techniques: - Discuss methods to conserve energy and decrease shortness of breath when performing activities of daily living.  Flowsheet Row PULMONARY REHAB OTHER RESPIRATORY from 07/01/2020 in [place]  Date 07/01/20  Educator mk  Instruction Review Code 2- Demonstrated Understanding      Bronchial Hygiene / Breathing Techniques: - Discuss breathing mechanics, pursed-lip breathing technique,  proper posture, effective ways to clear airways, and other functional breathing techniques   Cleaning Equipment: - Provides group verbal and written instruction about the health risks of elevated stress, cause of high stress, and healthy ways to reduce stress.   Nutrition I: Fats: - Discuss the types of cholesterol, what cholesterol does to the body, and how cholesterol levels can be controlled.   Nutrition II: Labels: -Discuss the different components of food labels and how to read food labels.   Respiratory Infections: - Discuss the signs and symptoms of respiratory infections, ways to prevent respiratory infections, and the importance of seeking medical treatment when having a respiratory infection.   Stress I: Signs and Symptoms: - Discuss the causes of stress, how stress may lead to anxiety and depression, and ways to limit stress.   Stress II: Relaxation: -Discuss relaxation techniques to limit stress.   Oxygen for Home/Travel: - Discuss how to prepare for travel when on oxygen and proper ways to transport and store oxygen to ensure safety.   Knowledge Questionnaire Score:  Knowledge Questionnaire Score - 06/16/20 1432      Knowledge Questionnaire  Score   Pre Score 12/18           Core Components/Risk Factors/Patient Goals at Admission:  Personal  Goals and Risk Factors at Admission - 06/16/20 1457      Core Components/Risk Factors/Patient Goals on Admission    Weight Management Weight Maintenance    Tobacco Cessation Yes    Number of packs per day 1/2 ppd    Intervention Assist the participant in steps to quit. Provide individualized education and counseling about committing to Tobacco Cessation, relapse prevention, and pharmacological support that can be provided by physician.    Improve shortness of breath with ADL's Yes    Intervention Provide education, individualized exercise plan and daily activity instruction to help decrease symptoms of SOB with activities of daily living.    Expected Outcomes Short Term: Improve cardiorespiratory fitness to achieve a reduction of symptoms when performing ADLs;Long Term: Be able to perform more ADLs without symptoms or delay the onset of symptoms    Stress Yes    Intervention Offer individual and/or small group education and counseling on adjustment to heart disease, stress management and health-related lifestyle change. Teach and support self-help strategies.;Refer participants experiencing significant psychosocial distress to appropriate mental health specialists for further evaluation and treatment. When possible, include family members and significant others in education/counseling sessions.    Expected Outcomes Short Term: Participant demonstrates changes in health-related behavior, relaxation and other stress management skills, ability to obtain effective social support, and compliance with psychotropic medications if prescribed.;Long Term: Emotional wellbeing is indicated by absence of clinically significant psychosocial distress or social isolation.    Personal Goal Other Yes    Personal Goal Get strong enough to have lung nodule surgery; have less SOB with walking; be able to help her husband with cleaning house.    Intervention Provide pulmonary rehab program with patient supplemental  exercise at home.    Expected Outcomes Patient will complete the program meeting both personal and program goals.           Core Components/Risk Factors/Patient Goals Review:   Goals and Risk Factor Review    Row Name 06/28/20 1516             Core Components/Risk Factors/Patient Goals Review   Personal Goals Review Tobacco Cessation;Improve shortness of breath with ADL's;Stress       Review Patient is new to PR 2 sessions. She has O2 sat between 91-95% on RA . We will encourage breathing techniques such as purse lipped breathing.   Her personal goals are to be able to walk better with less SOB and get strong enough to have lung surgery.       Expected Outcomes We will continue to monitor as she progresses in the program.              Core Components/Risk Factors/Patient Goals at Discharge (Final Review):   Goals and Risk Factor Review - 06/28/20 1516      Core Components/Risk Factors/Patient Goals Review   Personal Goals Review Tobacco Cessation;Improve shortness of breath with ADL's;Stress    Review Patient is new to PR 2 sessions. She has O2 sat between 91-95% on RA . We will encourage breathing techniques such as purse lipped breathing.   Her personal goals are to be able to walk better with less SOB and get strong enough to have lung surgery.    Expected Outcomes We will continue to monitor as she progresses in the program.  ITP Comments:   Comments: ITP REVIEW Pt is making expected progress toward pulmonary rehab goals after completing 6 sessions. Recommend continued exercise, life style modification, education, and utilization of breathing techniques to increase stamina and strength and decrease shortness of breath with exertion.

## 2020-07-08 NOTE — Progress Notes (Signed)
Daily Session Note  Patient Details  Name: Jasmine [name] MRN: [id number] Date of Birth: 1955-11-22 Referring Provider:   Flowsheet Row PULMONARY REHAB OTHER RESP ORIENTATION from 06/16/2020 in [place]  Referring Provider Roxan Hockey      Encounter Date: 07/08/2020  Check In:  Session Check In - 07/08/20 1330      Check-In   Supervising physician immediately available to respond to emergencies [facility] MD immediately available    Physician(s) Dr. Domenic Polite    Staff Present Aundra Dubin, RN, BSN;Madison Audria Nine, MS, Exercise Physiologist    Virtual Visit No    Medication changes reported     No    Fall or balance concerns reported    No    Tobacco Cessation No Change    Warm-up and Cool-down Performed as group-led instruction    Resistance Training Performed Yes    VAD Patient? No    PAD/SET Patient? No      Pain Assessment   Currently in Pain? Yes    Pain Score 6     Pain Location Back    Pain Orientation Lower    Pain Descriptors / Indicators Aching    Pain Type Chronic pain    Pain Radiating Towards None    Pain Onset More than a month ago    Pain Frequency Constant    Effect of Pain on Daily Activities Limits mobility at times.    Multiple Pain Sites No           Capillary Blood Glucose: No results found for this or any previous visit (from the past 24 hour(s)).    Social History   Tobacco Use  Smoking Status Current Every Day Smoker  . Packs/day: 0.50  . Years: 35.00  . Pack years: 17.50  . Types: Cigarettes  Smokeless Tobacco Never Used  Tobacco Comment   trying to quit    Goals Met:  Proper associated with RPD/PD & O2 Sat Independence with exercise equipment Improved SOB with ADL's Using PLB without cueing & demonstrates good technique Exercise tolerated well No report of cardiac concerns or symptoms Strength training completed today  Goals Unmet:  Not Applicable  Comments: Check out 1430.   Dr. Kathie Dike is  Medical Director for [facility].

## 2020-07-09 ENCOUNTER — Ambulatory Visit: Payer: Medicare Other | Admitting: Family Medicine

## 2020-07-09 ENCOUNTER — Ambulatory Visit (INDEPENDENT_AMBULATORY_CARE_PROVIDER_SITE_OTHER): Payer: Medicare Other

## 2020-07-09 ENCOUNTER — Other Ambulatory Visit: Payer: Self-pay

## 2020-07-09 ENCOUNTER — Encounter: Payer: Self-pay | Admitting: Family Medicine

## 2020-07-09 ENCOUNTER — Ambulatory Visit (INDEPENDENT_AMBULATORY_CARE_PROVIDER_SITE_OTHER): Payer: Medicare Other | Admitting: Family Medicine

## 2020-07-09 VITALS — BP 136/76 | HR 115 | Temp 98.1°F | Ht 63.0 in | Wt 96.0 lb

## 2020-07-09 DIAGNOSIS — I10 Essential (primary) hypertension: Secondary | ICD-10-CM | POA: Diagnosis not present

## 2020-07-09 DIAGNOSIS — Z0000 Encounter for general adult medical examination without abnormal findings: Secondary | ICD-10-CM

## 2020-07-09 DIAGNOSIS — D508 Other iron deficiency anemias: Secondary | ICD-10-CM

## 2020-07-09 DIAGNOSIS — Z780 Asymptomatic menopausal state: Secondary | ICD-10-CM

## 2020-07-09 DIAGNOSIS — Z720 Tobacco use: Secondary | ICD-10-CM

## 2020-07-09 DIAGNOSIS — M5441 Lumbago with sciatica, right side: Secondary | ICD-10-CM

## 2020-07-09 DIAGNOSIS — J9611 Chronic respiratory failure with hypoxia: Secondary | ICD-10-CM | POA: Diagnosis not present

## 2020-07-09 DIAGNOSIS — J432 Centrilobular emphysema: Secondary | ICD-10-CM

## 2020-07-09 DIAGNOSIS — D649 Anemia, unspecified: Secondary | ICD-10-CM

## 2020-07-09 DIAGNOSIS — E785 Hyperlipidemia, unspecified: Secondary | ICD-10-CM | POA: Diagnosis not present

## 2020-07-09 DIAGNOSIS — R636 Underweight: Secondary | ICD-10-CM

## 2020-07-09 DIAGNOSIS — G8929 Other chronic pain: Secondary | ICD-10-CM

## 2020-07-09 MED ORDER — AMLODIPINE BESYLATE 10 MG PO TABS: 1.0000 | ORAL_TABLET | Freq: Every day | ORAL | 3 refills | Status: DC

## 2020-07-09 MED ORDER — LEVALBUTEROL TARTRATE 45 MCG/ACT IN AERO
2.0000 | INHALATION_SPRAY | Freq: Four times a day (QID) | RESPIRATORY_TRACT | 12 refills | Status: DC | PRN
Start: 2020-07-09 — End: 2020-08-17

## 2020-07-09 MED ORDER — ATORVASTATIN CALCIUM 40 MG PO TABS: 40.0000 mg | ORAL_TABLET | Freq: Every day | ORAL | 3 refills | Status: DC

## 2020-07-09 MED ORDER — DULOXETINE HCL 30 MG PO CPEP: 90.0000 mg | ORAL_CAPSULE | Freq: Every day | ORAL | 4 refills | Status: DC

## 2020-07-09 NOTE — Progress Notes (Signed)
Jasmine [name] is a 65 y.o. female presents to office today for annual physical exam examination.    Concerns today include: 1.  Lung cancer Patient was told that she has a right-sided lung cancer.  She will be seeing her specialist soon to discuss having surgery.  This was determined to be a high risk surgery but she did not want to go through radiation and then potentially need surgery later.  She admits that breathing is not the best.  She is compliant with all of her inhalers.  In fact she reports to me that the albuterol seems to make her very tremulous and she wants to know if there are any alternatives to this.  She is trying her best to wean from cigarettes but unfortunately her insurance stopped paying for her nicotine patches.  She was down to 14 mg daily.  She was given the 1 800 quit now number but she has not yet contacted them.  It is on her to do list  Marital status: married, Substance use: smoking Diet: poor, not totally compliant with Ensure daily. Exercise: No structured due to breathing Last mammogram: Needs Last pap smear: N/A Immunizations needed: Immunization History  Administered Date(s) Administered  . Fluad Quad(high Dose 65+) 12/02/2018, 03/10/2020  . Influenza, Quadrivalent, Recombinant, Inj, Pf 01/16/2017  . Influenza,inj,Quad PF,6+ Mos 02/22/2015, 01/03/2016  . Influenza-Unspecified 12/04/2017  . Moderna Sars-Covid-2 Vaccination 05/29/2019, 06/29/2019  . PFIZER(Purple Top)SARS-COV-2 Vaccination 03/30/2020  . Pneumococcal Conjugate-13 08/20/2019  . Tdap 05/29/2020     Past Medical History:  Diagnosis Date  . Allergy   . Anemia   . Anxiety   . Arthritis   . Asthma   . Carpal tunnel syndrome   . Chronic back pain   . Cigarette smoker 08/25/2017  . Complication of anesthesia    in the past has had N/V, the last few surgeries she has not been  . COPD (chronic obstructive pulmonary disease) ([place])   . Easy bruising 07/10/2016  . GERD (gastroesophageal  reflux disease)   . Headache   . Heart murmur 2005   never had any problems  . History of blood transfusion 2018  . History of kidney stones   . Hypertension   . Hypoglycemia   . Hypoglycemia   . Iron deficiency anemia 07/11/2016  . Neuropathy    both arms  . Normocytic anemia 07/29/2015  . Pancreatitis   . Pneumonia   . PONV (postoperative nausea and vomiting)   . Postoperative anemia due to acute blood loss 11/15/2015  . R lung cancer   . Sciatica   . Seizures ([place])    as a child when she would have an asthma attack. none as an adult  . Shortness of breath dyspnea    Social History   Socioeconomic History  . Marital status: Married    Spouse name: [name]  . Number of children: 1  . Years of education: Not on file  . Highest education level: Some college, no degree  Occupational History  . Not on file  Tobacco Use  . Smoking status: Current Every Day Smoker    Packs/day: 0.50    Years: 35.00    Pack years: 17.50    Types: Cigarettes  . Smokeless tobacco: Never Used  . Tobacco comment: trying to quit  Vaping Use  . Vaping Use: Never used  Substance and Sexual Activity  . Alcohol use: Yes    Alcohol/week: 2.0 standard drinks    Types: 2  Glasses of wine per week    Comment: occasionally  . Drug use: No  . Sexual activity: Not on file  Other Topics Concern  . Not on file  Social History Narrative   Retired, 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 1 son, deceased. 2 pet dogs. Enjoys watching TV.   Social Determinants of Health   Financial Resource Strain: Not on file  Food Insecurity: Not on file  Transportation Needs: Not on file  Physical Activity: Not on file  Stress: Not on file  Social Connections: Not on file  Intimate Partner Violence: Not At Risk  . Fear of Current or Ex-Partner: No  . Emotionally Abused: No  . Physically Abused: No  . Sexually Abused: No   Past Surgical History:  Procedure Laterality Date  . ABDOMINAL HYSTERECTOMY  2005   . APPLICATION OF ROBOTIC ASSISTANCE FOR SPINAL PROCEDURE N/A 09/05/2016   Procedure: APPLICATION OF ROBOTIC ASSISTANCE FOR SPINAL PROCEDURE;  Surgeon: Consuella Lose, MD;  Location: [facility];  Service: Neurosurgery;  Laterality: N/A;  . BIOPSY  06/14/2020   Procedure: BIOPSY;  Surgeon: Daneil Dolin, MD;  [facility];  Service: Endoscopy;;  . CERVICAL FUSION    . [place]  . COLONOSCOPY     unsure last  . COLONOSCOPY WITH PROPOFOL N/A 06/14/2020   Procedure: COLONOSCOPY WITH PROPOFOL;  Surgeon: Daneil Dolin, MD;  [facility];  Service: Endoscopy;  Laterality: N/A;  PM  . cyst removal face    . disk repair     neck and lumbar region  . ESOPHAGOGASTRODUODENOSCOPY (EGD) WITH PROPOFOL N/A 06/14/2020   Procedure: ESOPHAGOGASTRODUODENOSCOPY (EGD) WITH PROPOFOL;  Surgeon: Daneil Dolin, MD;  [facility];  Service: Endoscopy;  Laterality: N/A;  . FOOT SURGERY Bilateral    to file bones down   . HARDWARE REMOVAL N/A 09/05/2016   Procedure: HARDWARE REMOVAL AND REPLACEMENT OF LUMBAR FIVE SCREWS;  Surgeon: Consuella Lose, MD;  Location: [facility];  Service: Neurosurgery;  Laterality: N/A;  . IMPLANTATION / PLACEMENT EPIDURAL NEUROSTIMULATOR ELECTRODES    . LAMINECTOMY     2006  . LUMBAR FUSION    . POLYPECTOMY  06/14/2020   Procedure: POLYPECTOMY INTESTINAL;  Surgeon: Daneil Dolin, MD;  [facility];  Service: Endoscopy;;  . SPINAL CORD STIMULATOR BATTERY EXCHANGE    . SPINAL CORD STIMULATOR INSERTION N/A 07/05/2016   Procedure: REPLACEMENT OF LUMBAR SPINAL CORD STIMULATOR BATTERY;  Surgeon: Newman Pies, MD;  Location: [facility];  Service: Neurosurgery;  Laterality: N/A;  . TONSILLECTOMY     Family History  Problem Relation Age of Onset  . Heart disease Mother   . Diabetes Mother   . Hypertension Mother   . Hyperlipidemia Mother   . COPD Mother        smoked  . Cancer Mother        bile duct  . COPD Father   . Diabetes  Father   . Heart disease Father   . Cancer Father        throat  . Dementia Father   . Asthma Son   . Hypertension Sister   . Hyperlipidemia Sister   . Hypertension Brother   . Hyperlipidemia Brother   . Hypertension Brother   . Hyperlipidemia Brother   . Emphysema Maternal Grandmother        smoked  . Colon cancer Neg Hx   . Colon polyps Neg Hx  Current Outpatient Medications:  .  acetaminophen (TYLENOL) 325 MG tablet, Take 2 tablets (650 mg total) by mouth every 6 (six) hours as needed for mild pain, fever or headache (or Fever >/= 101)., Disp: 12 tablet, Rfl: 2 .  albuterol (PROAIR HFA) 108 (90 Base) MCG/ACT inhaler, USE 2 INHALATIONS EVERY 6 HOURS AS NEEDED FOR WHEEZING OR SHORTNESS OF BREATH (NEED TO BE SEEN BEFORE NEXT REFILL), Disp: 25.5 g, Rfl: 1 .  amLODipine (NORVASC) 10 MG tablet, Take 1 tablet (10 mg total) by mouth daily., Disp: 90 tablet, Rfl: 0 .  atorvastatin (LIPITOR) 40 MG tablet, Take 1 tablet (40 mg total) by mouth daily., Disp: 90 tablet, Rfl: 1 .  budesonide-formoterol (SYMBICORT) 160-4.5 MCG/ACT inhaler, USE 2 INHALATIONS TWICE A DAY (Patient taking differently: Inhale 2 puffs into the lungs in the morning and at bedtime. USE 2 INHALATIONS TWICE A DAY), Disp: 20.4 g, Rfl: 2 .  cetirizine (ZYRTEC) 10 MG tablet, Take 10 mg by mouth daily as needed for allergies. Reported on 03/12/2015, Disp: , Rfl:  .  Cholecalciferol 125 MCG (5000 UT) capsule, Take 5,000 Units daily by mouth., Disp: , Rfl:  .  diclofenac sodium (VOLTAREN) 1 % GEL, Apply 1 application topically daily as needed for muscle pain., Disp: , Rfl:  .  DULoxetine (CYMBALTA) 30 MG capsule, Take 3 capsules (90 mg total) by mouth daily., Disp: 270 capsule, Rfl: 4 .  feeding supplement, ENSURE ENLIVE, (ENSURE ENLIVE) LIQD, Take 237 mLs by mouth 2 (two) times daily between meals., Disp: 237 mL, Rfl: 12 .  guaiFENesin (MUCINEX) 600 MG 12 hr tablet, Take 600 mg by mouth 2 (two) times daily., Disp: , Rfl:  .   HYDROcodone Bitartrate ER 60 MG T24A, Take 1 tablet by mouth daily., Disp: , Rfl:  .  HYDROcodone-acetaminophen (NORCO) 7.5-325 MG tablet, Take 1 tablet by mouth 4 (four) times daily., Disp: , Rfl:  .  ipratropium-albuterol (DUONEB) 0.5-2.5 (3) MG/3ML SOLN, Inhale 3 mLs into the lungs 4 (four) times daily as needed (for shortness of breath/wheezing). *May use as needed, Disp: , Rfl:  .  Iron Polysacch Cmplx-B12-FA (POLY-IRON 150 FORTE) 150-0.025-1 MG CAPS, Take 1 capsule by mouth 2 (two) times daily. (Patient taking differently: Take 1 capsule by mouth daily.), Disp: 60 capsule, Rfl: 2 .  L-Methylfolate-Algae-B12-B6 (METANX) 3-90.314-2-35 MG CAPS, TAKE 1 TABLET BY MOUTH TWO TIMES A DAY (Patient taking differently: Take 1 tablet by mouth daily.), Disp: 180 capsule, Rfl: 4 .  magnesium oxide (MAG-OX) 400 (241.3 Mg) MG tablet, Take 1 tablet by mouth 2 (two) times daily., Disp: , Rfl:  .  nicotine (NICODERM CQ) 14 mg/24hr patch, Place 1 patch (14 mg total) onto the skin daily., Disp: 30 patch, Rfl: 0 .  nicotine (NICODERM CQ) 7 mg/24hr patch, Place 1 patch (7 mg total) onto the skin daily., Disp: 60 patch, Rfl: 0 .  omeprazole (PRILOSEC) 40 MG capsule, Take 1 capsule (40 mg total) by mouth 2 (two) times daily. Take 1 capsule 30 mins before breakfast and evening meal., Disp: 90 capsule, Rfl: 3 .  ondansetron (ZOFRAN-ODT) 8 MG disintegrating tablet, Take 8 mg by mouth every 8 (eight) hours as needed., Disp: , Rfl:  .  OXYGEN, 2lpm with sleep and as needed during the day, Disp: , Rfl:  .  Tiotropium Bromide Monohydrate (SPIRIVA RESPIMAT) 2.5 MCG/ACT AERS, Inhale 2 puffs into the lungs daily., Disp: 12 g, Rfl: 3 .  tizanidine (ZANAFLEX) 6 MG capsule, Take 6 mg by mouth  3 (three) times daily., Disp: , Rfl:  .  traZODone (DESYREL) 150 MG tablet, TAKE 1 TO 2 TABLETS AT BEDTIME (Patient taking differently: Take 150-300 mg by mouth at bedtime.), Disp: 180 tablet, Rfl: 3  Allergies  Allergen Reactions  . Lyrica  [Pregabalin] Other (See Comments)    EXTREME SHAKING/TREMBLING  . Darvon [Propoxyphene]     UNSPECIFIED REACTION   . Gabapentin     Extreme shaking and trembling   . Augmentin [Amoxicillin-Pot Clavulanate] Nausea And Vomiting    Has patient had a PCN reaction causing immediate rash, facial/tongue/throat swelling, SOB or lightheadedness with hypotension:no Has patient had a PCN reaction causing severe rash involving mucus membranes or skin necrosis:No Has patient had a PCN reaction that required hospitalization:No Has patient had a PCN reaction occurring within the last 10 years:Yes--ONLY N/V If all of the above answers are "NO", then may proceed with Cephalosporin use.   . Erythromycin Itching and Rash       . Vibramycin [Doxycycline Calcium] Itching and Rash    "[place] "     ROS: Review of Systems Pertinent items noted in HPI and remainder of comprehensive ROS otherwise negative.    Physical exam BP 136/76   Pulse (!) 115   Temp 98.1 F (36.7 C)   Ht _0  (1.6 m)   Wt 96 lb (43.5 kg)   SpO2 90%   BMI 17.01 kg/m  General appearance: alert, cooperative and thin, underweight Head: Normocephalic, without obvious abnormality, atraumatic Eyes: negative findings: lids and lashes normal, conjunctivae and sclerae normal, corneas clear and pupils equal, round, reactive to light and accomodation Ears: normal TM's and external ear canals both ears Nose: Nares normal. Septum midline. Mucosa normal. No drainage or sinus tenderness. Throat: lips, mucosa, and tongue normal; teeth and gums normal and mallampati 3 Neck: no adenopathy, supple, symmetrical, trachea midline and thyroid not enlarged, symmetric, no tenderness/mass/nodules Back: symmetric, no curvature. ROM normal. No CVA tenderness. Lungs: Globally decreased breath sounds.  She has difficulty breathing when laying flat.  No wheezes, rhonchi or rales noted. Heart: regular rate and rhythm, S1, S2 normal, no murmur, click, rub  or gallop Abdomen: soft, non-tender; bowel sounds normal; no masses,  no organomegaly Extremities: extremities normal, atraumatic, no cyanosis or edema Pulses: 2+ and symmetric Skin: Skin color, texture, turgor normal. No rashes or lesions Lymph nodes: Cervical, supraclavicular, and axillary nodes normal. Neurologic: tremulous, unsteady gait. Requires assistance for ambulation   Assessment/ Plan: Jasmine [name] here for annual physical exam.   Annual physical exam  Essential hypertension - Plan: CMP14+EGFR  Hyperlipidemia, unspecified hyperlipidemia type - Plan: Lipid Panel, TSH, CMP14+EGFR  Chronic respiratory failure with hypoxia (HCC) - Plan: levalbuterol (XOPENEX HFA) 45 MCG/ACT inhaler  Centrilobular emphysema ([place]) - Plan: levalbuterol (XOPENEX HFA) 45 MCG/ACT inhaler  Tobacco use  Asymptomatic postmenopausal estrogen deficiency - Plan: DG [facility] DEXA  Underweight - Plan: DG [facility] DEXA  Chronic right-sided low back pain with right-sided sciatica - Plan: DULoxetine (CYMBALTA) 30 MG capsule  Anemia, unspecified type - Plan: CBC  Blood pressure is well controlled.  Continue current regimen  Check fasting lipid panel, TSH and CMP  Ongoing breathing issues and plans for right lung resection due to cancer  She is quite jittery on exam.  She attributes this to use of albuterol.  Trial of Xopenex.  She will contact me if this is helpful and we will make the switch and sent to mail order pharmacy  Continue Cymbalta.  Renewal  sent  DEXA scan ordered.  Check CBC given known anemia in the past and possible upcoming surgery  Counseled on healthy lifestyle choices, including diet (rich in fruits, vegetables and lean meats and low in salt and simple carbohydrates) and exercise (at least 30 minutes of moderate physical activity daily).  Patient to follow up in 1 year for annual exam or sooner if needed.  Nanako Stopher M. Lajuana Ripple, DO

## 2020-07-09 NOTE — Patient Instructions (Signed)
Preventive Care 67 Years and Older, Female Preventive care refers to lifestyle choices and visits with your health care provider that can promote health and wellness. This includes:  A yearly physical exam. This is also called an annual wellness visit.  Regular dental and eye exams.  Immunizations.  Screening for certain conditions.  Healthy lifestyle choices, such as: ? Eating a healthy diet. ? Getting regular exercise. ? Not using drugs or products that contain nicotine and tobacco. ? Limiting alcohol use. What can I expect for my preventive care visit? Physical exam Your health care provider will check your:  Height and weight. These may be used to calculate your BMI (body mass index). BMI is a measurement that tells if you are at a healthy weight.  Heart rate and blood pressure.  Body temperature.  Skin for abnormal spots. Counseling Your health care provider may ask you questions about your:  Past medical problems.  Family's medical history.  Alcohol, tobacco, and drug use.  Emotional well-being.  Home life and relationship well-being.  Sexual activity.  Diet, exercise, and sleep habits.  History of falls.  Memory and ability to understand (cognition).  Work and work Statistician.  Pregnancy and menstrual history.  Access to firearms. What immunizations do I need? Vaccines are usually given at various ages, according to a schedule. Your health care provider will recommend vaccines for you based on your age, medical history, and lifestyle or other factors, such as travel or where you work.   What tests do I need? Blood tests  Lipid and cholesterol levels. These may be checked every 5 years, or more often depending on your overall health.  Hepatitis C test.  Hepatitis B test. Screening  Lung cancer screening. You may have this screening every year starting at age 67 if you have a 30-pack-year history of smoking and currently smoke or have quit within  the past 15 years.  Colorectal cancer screening. ? All adults should have this screening starting at age 67 and continuing until age 67. ? Your health care provider may recommend screening at age 67 if you are at increased risk. ? You will have tests every 1-10 years, depending on your results and the type of screening test.  Diabetes screening. ? This is done by checking your blood sugar (glucose) after you have not eaten for a while (fasting). ? You may have this done every 1-3 years.  Mammogram. ? This may be done every 1-2 years. ? Talk with your health care provider about how often you should have regular mammograms.  Abdominal aortic aneurysm (AAA) screening. You may need this if you are a current or former smoker.  BRCA-related cancer screening. This may be done if you have a family history of breast, ovarian, tubal, or peritoneal cancers. Other tests  STD (sexually transmitted disease) testing, if you are at risk.  Bone density scan. This is done to screen for osteoporosis. You may have this done starting at age 67. Talk with your health care provider about your test results, treatment options, and if necessary, the need for more tests. Follow these instructions at home: Eating and drinking  Eat a diet that includes fresh fruits and vegetables, whole grains, lean protein, and low-fat dairy products. Limit your intake of foods with high amounts of sugar, saturated fats, and salt.  Take vitamin and mineral supplements as recommended by your health care provider.  Do not drink alcohol if your health care provider tells you not to drink.  If you drink alcohol: ? Limit how much you have to 0-1 drink a day. ? Be aware of how much alcohol is in your drink. In the [place] one drink equals one 12 oz bottle of beer (355 mL), one 5 oz glass of wine (148 mL), or one 1 oz glass of hard liquor (44 mL).   Lifestyle  Take daily care of your teeth and gums. Brush your teeth every morning  and night with fluoride toothpaste. Floss one time each day.  Stay active. Exercise for at least 30 minutes 5 or more days each week.  Do not use any products that contain nicotine or tobacco, such as cigarettes, e-cigarettes, and chewing tobacco. If you need help quitting, ask your health care provider.  Do not use drugs.  If you are sexually active, practice safe sex. Use a condom or other form of protection in order to prevent STIs (sexually transmitted infections).  Talk with your health care provider about taking a low-dose aspirin or statin.  Find healthy ways to cope with stress, such as: ? Meditation, yoga, or listening to music. ? Journaling. ? Talking to a trusted person. ? Spending time with friends and family. Safety  Always wear your seat belt while driving or riding in a vehicle.  Do not drive: ? If you have been drinking alcohol. Do not ride with someone who has been drinking. ? When you are tired or distracted. ? While texting.  Wear a helmet and other protective equipment during sports activities.  If you have firearms in your house, make sure you follow all gun safety procedures. What's next?  Visit your health care provider once a year for an annual wellness visit.  Ask your health care provider how often you should have your eyes and teeth checked.  Stay up to date on all vaccines. This information is not intended to replace advice given to you by your health care provider. Make sure you discuss any questions you have with your health care provider. Document Revised: 02/11/2020 Document Reviewed: 02/14/2018 Elsevier Patient Education  2021 [employer]  

## 2020-07-10 LAB — CMP14+EGFR
ALT: 23 IU/L (ref 0–32)
AST: 29 IU/L (ref 0–40)
Albumin/Globulin Ratio: 1.9 (ref 1.2–2.2)
Albumin: 4.3 g/dL (ref 3.8–4.8)
Alkaline Phosphatase: 92 IU/L (ref 44–121)
BUN/Creatinine Ratio: 14 (ref 12–28)
BUN: 8 mg/dL (ref 8–27)
Bilirubin Total: 0.5 mg/dL (ref 0.0–1.2)
CO2: 24 mmol/L (ref 20–29)
Calcium: 9.3 mg/dL (ref 8.7–10.3)
Chloride: 97 mmol/L (ref 96–106)
Creatinine, Ser: 0.57 mg/dL (ref 0.57–1.00)
Globulin, Total: 2.3 g/dL (ref 1.5–4.5)
Glucose: 81 mg/dL (ref 65–99)
Potassium: 4.4 mmol/L (ref 3.5–5.2)
Sodium: 141 mmol/L (ref 134–144)
Total Protein: 6.6 g/dL (ref 6.0–8.5)
eGFR: 100 mL/min/{1.73_m2} (ref >59)

## 2020-07-10 LAB — CBC
Hematocrit: 34.7 % (ref 34.0–46.6)
Hemoglobin: 11.7 g/dL (ref 11.1–15.9)
MCH: 32 pg (ref 26.6–33.0)
MCHC: 33.7 g/dL (ref 31.5–35.7)
MCV: 95 fL (ref 79–97)
Platelets: 245 10*3/uL (ref 150–450)
RBC: 3.66 x10E6/uL — ABNORMAL LOW (ref 3.77–5.28)
RDW: 12.9 % (ref 11.7–15.4)
WBC: 12.6 10*3/uL — ABNORMAL HIGH (ref 3.4–10.8)

## 2020-07-10 LAB — LIPID PANEL
Chol/HDL Ratio: 1.6 ratio (ref 0.0–4.4)
Cholesterol, Total: 242 mg/dL — ABNORMAL HIGH (ref 100–199)
HDL: 152 mg/dL (ref >39)
LDL Chol Calc (NIH): 80 mg/dL (ref 0–99)
Triglycerides: 60 mg/dL (ref 0–149)
VLDL Cholesterol Cal: 10 mg/dL (ref 5–40)

## 2020-07-10 LAB — TSH: TSH: 2.05 u[IU]/mL (ref 0.450–4.500)

## 2020-07-13 ENCOUNTER — Other Ambulatory Visit: Payer: Self-pay | Admitting: Internal Medicine

## 2020-07-13 ENCOUNTER — Encounter (HOSPITAL_COMMUNITY): Payer: Medicare Other

## 2020-07-14 ENCOUNTER — Ambulatory Visit: Payer: Medicare Other | Admitting: Thoracic Surgery (Cardiothoracic Vascular Surgery)

## 2020-07-15 ENCOUNTER — Encounter (HOSPITAL_COMMUNITY): Payer: Medicare Other

## 2020-07-19 ENCOUNTER — Other Ambulatory Visit: Payer: Self-pay | Admitting: *Deleted

## 2020-07-19 ENCOUNTER — Encounter: Payer: Self-pay | Admitting: Thoracic Surgery (Cardiothoracic Vascular Surgery)

## 2020-07-19 ENCOUNTER — Encounter: Payer: Self-pay | Admitting: *Deleted

## 2020-07-19 ENCOUNTER — Ambulatory Visit (INDEPENDENT_AMBULATORY_CARE_PROVIDER_SITE_OTHER): Payer: Medicare Other | Admitting: Thoracic Surgery (Cardiothoracic Vascular Surgery)

## 2020-07-19 ENCOUNTER — Other Ambulatory Visit: Payer: Self-pay

## 2020-07-19 VITALS — BP 150/80 | HR 100 | Temp 98.1°F | Resp 20 | Ht 63.0 in | Wt 94.0 lb

## 2020-07-19 DIAGNOSIS — R911 Solitary pulmonary nodule: Secondary | ICD-10-CM

## 2020-07-19 NOTE — Progress Notes (Signed)
[address]       [place] [address]             [phone]    HPI: [name] returns to further discuss management of her right upper lobe lung nodule  Jasmine [name] is a 65 year old woman with a history of asthma, COPD, tobacco abuse, reflux, hypertension, hyperglycemia, anemia, anxiety, chronic pain, sciatica, neuropathy, right foot drop, pancreatitis, pneumonia, and seizures as a child.  She was being evaluated for dyspnea.  A CT showed a 1.5 x 1.5 x 1.7 cm spiculated right upper lobe lung nodule.  The nodule was adjacent to and retracting the minor fissure.  There was a small subpleural nodule in the middle lobe along the fissure.  She had a PET/CT which showed the nodule was hypermetabolic with SUV of 5.5.  There was concern for enlarged main pulmonary arteries on the CT chest.  We did an echocardiogram which showed some right ventricular enlargement but normal systolic function and no evidence of right heart failure.  She has been participating in pulmonary rehab.  She continues to smoke.  She says she has cut back.  She has not been able to gain weight but her weight has been stable.  She still is adamantly in favor of surgery and does not want to do radiation.  She uses 2 L nasal cannula oxygen at night and as needed during the day. Zubrod Score: At the time of surgery this patient's most appropriate activity status/level should be described as: []     0    Normal activity, no symptoms [x]     1    Restricted in physical strenuous activity but ambulatory, able to do out light work []     2    Ambulatory and capable of self care, unable to do work activities, up and about >50 % of waking hours                              []     3    Only limited self care, in bed greater than 50% of waking hours []     4    Completely disabled, no self care, confined to bed or chair []     5    Moribund  Past Medical History:  Diagnosis Date  . Allergy   . Anemia   . Anxiety    . Arthritis   . Asthma   . Carpal tunnel syndrome   . Chronic back pain   . Cigarette smoker 08/25/2017  . Complication of anesthesia    in the past has had N/V, the last few surgeries she has not been  . COPD (chronic obstructive pulmonary disease) ([place])   . Easy bruising 07/10/2016  . GERD (gastroesophageal reflux disease)   . Headache   . Heart murmur 2005   never had any problems  . History of blood transfusion 2018  . History of kidney stones   . Hypertension   . Hypoglycemia   . Hypoglycemia   . Iron deficiency anemia 07/11/2016  . Neuropathy    both arms  . Normocytic anemia 07/29/2015  . Pancreatitis   . Pneumonia   . PONV (postoperative nausea and vomiting)   . Postoperative anemia due to acute blood loss 11/15/2015  . R lung cancer   . Sciatica   . Seizures ([place])    as a child when she would have an  asthma attack. none as an adult  . Shortness of breath dyspnea    Past Surgical History:  Procedure Laterality Date  . ABDOMINAL HYSTERECTOMY  2005  . APPLICATION OF ROBOTIC ASSISTANCE FOR SPINAL PROCEDURE N/A 09/05/2016   Procedure: APPLICATION OF ROBOTIC ASSISTANCE FOR SPINAL PROCEDURE;  Surgeon: Consuella Lose, MD;  Location: [facility];  Service: Neurosurgery;  Laterality: N/A;  . BIOPSY  06/14/2020   Procedure: BIOPSY;  Surgeon: Daneil Dolin, MD;  [facility];  Service: Endoscopy;;  . CERVICAL FUSION    . [place]  . COLONOSCOPY     unsure last  . COLONOSCOPY WITH PROPOFOL N/A 06/14/2020   Procedure: COLONOSCOPY WITH PROPOFOL;  Surgeon: Daneil Dolin, MD;  [facility];  Service: Endoscopy;  Laterality: N/A;  PM  . cyst removal face    . disk repair     neck and lumbar region  . ESOPHAGOGASTRODUODENOSCOPY (EGD) WITH PROPOFOL N/A 06/14/2020   Procedure: ESOPHAGOGASTRODUODENOSCOPY (EGD) WITH PROPOFOL;  Surgeon: Daneil Dolin, MD;  [facility];  Service: Endoscopy;  Laterality: N/A;  . FOOT SURGERY Bilateral    to  file bones down   . HARDWARE REMOVAL N/A 09/05/2016   Procedure: HARDWARE REMOVAL AND REPLACEMENT OF LUMBAR FIVE SCREWS;  Surgeon: Consuella Lose, MD;  Location: [facility];  Service: Neurosurgery;  Laterality: N/A;  . IMPLANTATION / PLACEMENT EPIDURAL NEUROSTIMULATOR ELECTRODES    . LAMINECTOMY     2006  . LUMBAR FUSION    . POLYPECTOMY  06/14/2020   Procedure: POLYPECTOMY INTESTINAL;  Surgeon: Daneil Dolin, MD;  [facility];  Service: Endoscopy;;  . SPINAL CORD STIMULATOR BATTERY EXCHANGE    . SPINAL CORD STIMULATOR INSERTION N/A 07/05/2016   Procedure: REPLACEMENT OF LUMBAR SPINAL CORD STIMULATOR BATTERY;  Surgeon: Newman Pies, MD;  Location: [facility];  Service: Neurosurgery;  Laterality: N/A;  . TONSILLECTOMY     Social History   Socioeconomic History  . Marital status: Married    Spouse name: [name]  . Number of children: 1  . Years of education: Not on file  . Highest education level: Some college, no degree  Occupational History  . Not on file  Tobacco Use  . Smoking status: Current Every Day Smoker    Packs/day: 0.50    Years: 35.00    Pack years: 17.50    Types: Cigarettes  . Smokeless tobacco: Never Used  . Tobacco comment: trying to quit  Vaping Use  . Vaping Use: Never used  Substance and Sexual Activity  . Alcohol use: Yes    Alcohol/week: 2.0 standard drinks    Types: 2 Glasses of wine per week    Comment: occasionally  . Drug use: No  . Sexual activity: Not on file  Other Topics Concern  . Not on file  Social History Narrative   Retired, 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 1 son, deceased. 2 pet dogs. Enjoys watching TV.   Social Determinants of Health   Financial Resource Strain: Not on file  Food Insecurity: Not on file  Transportation Needs: Not on file  Physical Activity: Not on file  Stress: Not on file  Social Connections: Not on file  Intimate Partner Violence: Not At Risk  . Fear of Current or Ex-Partner: No  .  Emotionally Abused: No  . Physically Abused: No  . Sexually Abused: No   Current Outpatient Medications  Medication Sig Dispense Refill  .  acetaminophen (TYLENOL) 325 MG tablet Take 2 tablets (650 mg total) by mouth every 6 (six) hours as needed for mild pain, fever or headache (or Fever >/= 101). 12 tablet 2  . amLODipine (NORVASC) 10 MG tablet Take 1 tablet (10 mg total) by mouth daily. 90 tablet 3  . atorvastatin (LIPITOR) 40 MG tablet Take 1 tablet (40 mg total) by mouth daily. 90 tablet 3  . budesonide-formoterol (SYMBICORT) 160-4.5 MCG/ACT inhaler USE 2 INHALATIONS TWICE A DAY (Patient taking differently: Inhale 2 puffs into the lungs in the morning and at bedtime. USE 2 INHALATIONS TWICE A DAY) 20.4 g 2  . cetirizine (ZYRTEC) 10 MG tablet Take 10 mg by mouth daily as needed for allergies. Reported on 03/12/2015    . Cholecalciferol 125 MCG (5000 UT) capsule Take 5,000 Units daily by mouth.    . diclofenac sodium (VOLTAREN) 1 % GEL Apply 1 application topically daily as needed for muscle pain.    . DULoxetine (CYMBALTA) 30 MG capsule Take 3 capsules (90 mg total) by mouth daily. 270 capsule 4  . feeding supplement, ENSURE ENLIVE, (ENSURE ENLIVE) LIQD Take 237 mLs by mouth 2 (two) times daily between meals. 237 mL 12  . guaiFENesin (MUCINEX) 600 MG 12 hr tablet Take 600 mg by mouth 2 (two) times daily.    Marland Kitchen HYDROcodone Bitartrate ER 60 MG T24A Take 1 tablet by mouth daily.    Marland Kitchen HYDROcodone-acetaminophen (NORCO) 7.5-325 MG tablet Take 1 tablet by mouth 4 (four) times daily.    Marland Kitchen ipratropium-albuterol (DUONEB) 0.5-2.5 (3) MG/3ML SOLN Inhale 3 mLs into the lungs 4 (four) times daily as needed (for shortness of breath/wheezing). *May use as needed    . Iron Polysacch Cmplx-B12-FA (POLY-IRON 150 FORTE) 150-0.025-1 MG CAPS Take 1 capsule by mouth 2 (two) times daily. (Patient taking differently: Take 1 capsule by mouth daily.) 60 capsule 2  . L-Methylfolate-Algae-B12-B6 (METANX) 3-90.314-2-35 MG  CAPS TAKE 1 TABLET BY MOUTH TWO TIMES A DAY (Patient taking differently: Take 1 tablet by mouth daily.) 180 capsule 4  . levalbuterol (XOPENEX HFA) 45 MCG/ACT inhaler Inhale 2 puffs into the lungs every 6 (six) hours as needed for wheezing or shortness of breath. 1 each 12  . magnesium oxide (MAG-OX) 400 (241.3 Mg) MG tablet Take 1 tablet by mouth 2 (two) times daily.    . nicotine (NICODERM CQ) 14 mg/24hr patch Place 1 patch (14 mg total) onto the skin daily. 30 patch 0  . nicotine (NICODERM CQ) 7 mg/24hr patch Place 1 patch (7 mg total) onto the skin daily. 60 patch 0  . omeprazole (PRILOSEC) 40 MG capsule Take 1 capsule (40 mg total) by mouth 2 (two) times daily. Take 1 capsule 30 mins before breakfast and evening meal. 90 capsule 3  . ondansetron (ZOFRAN-ODT) 8 MG disintegrating tablet Take 8 mg by mouth every 8 (eight) hours as needed.    . OXYGEN 2lpm with sleep and as needed during the day    . SPIRIVA RESPIMAT 2.5 MCG/ACT AERS USE 2 INHALATIONS DAILY 12 g 3  . tizanidine (ZANAFLEX) 6 MG capsule Take 6 mg by mouth 3 (three) times daily.    . traZODone (DESYREL) 150 MG tablet TAKE 1 TO 2 TABLETS AT BEDTIME (Patient taking differently: Take 150-300 mg by mouth at bedtime.) 180 tablet 3   No current facility-administered medications for this visit.    Physical Exam BP (!) 150/80   Pulse 100   Temp 98.1 F (36.7 C) (Skin)  Resp 20   Ht 5\' 3"  (1.6 m)   Wt 94 lb (42.6 kg)   SpO2 91% Comment: RA  BMI 16.20 kg/m  65 year old woman in no acute distress Alert and oriented x3 with no focal deficits Thin No cervical or supraclavicular adenopathy Lungs diminished breath sounds bilaterally, no wheezing or rails Cardiac regular rate and rhythm with 2/6 systolic murmur Abdomen benign Mild clubbing, no cyanosis or edema No peripheral edema  Diagnostic Tests: NUCLEAR MEDICINE PET SKULL BASE TO THIGH  TECHNIQUE: 6.5 mCi F-18 FDG was injected intravenously. Full-ring PET imaging was  performed from the skull base to thigh after the radiotracer. CT data was obtained and used for attenuation correction and anatomic localization.  Fasting blood glucose: 97 mg/dl  COMPARISON:  CT chest 03/19/2020  FINDINGS: Mediastinal blood pool activity: SUV max 2.01  Liver activity: SUV max NA  NECK: No hypermetabolic lymph nodes in the neck.  Incidental CT findings: none  CHEST: No hypermetabolic mediastinal or hilar nodes. The right upper lobe pulmonary nodule is again seen measuring 1.7 cm with an SUV max of 5.5, image 105/3. Adjacent perifissural nodule is stable measuring 0.6 cm within SUV max of 0.48.  Incidental CT findings: Moderate changes of emphysema. Aortic atherosclerosis. Coronary artery atherosclerotic calcifications.  ABDOMEN/PELVIS: No abnormal hypermetabolic activity within the liver, pancreas, adrenal glands, or spleen. No hypermetabolic lymph nodes in the abdomen or pelvis.  Incidental CT findings: Diffuse hepatic steatosis.  SKELETON: No focal hypermetabolic activity to suggest skeletal metastasis.  Incidental CT findings: Postop change from lumbar spine fusion identified. Dorsal column stimulator is identified within the thoracic spine.  IMPRESSION: 1. Right upper lobe lung nodule is FDG avid and concerning for primary pulmonary neoplasm. No signs of FDG avid nodal metastasis or distant metastatic disease. 2. Aortic Atherosclerosis (ICD10-I70.0) and Emphysema (ICD10-J43.9). 3. Coronary artery calcifications. 4. Hepatic steatosis.   Electronically Signed   By: Kerby Moors M.D.   On: 04/20/2020 10:03 I personally reviewed the CT and PET/CT images again.  There is a hypermetabolic right upper lobe nodule that is highly suspicious for a new primary bronchogenic carcinoma.  No evidence of regional or distant metastatic disease.  Pulmonary function testing FVC 2.25 (77%) FEV1 1.41 (62%) FEV1 1.61 (72%)  postbronchodilator DLCO 11.74 (55%)  Impression: Jasmine [name] is a 65 year old woman with a history of asthma, COPD, tobacco abuse, reflux, hypertension, hyperglycemia, anemia, anxiety, chronic pain, sciatica, neuropathy, right foot drop, pancreatitis, and pneumonia.  She was found to have a right upper lobe lung nodule on a CT that was done to evaluate for dyspnea.  She had a PET/CT which showed nodules hypermetabolic.  There was no evidence of regional or distant metastatic disease.  She has clinical stage Ia (T1, N0) disease although there is a possibility of visceral pleural involvement and may be 1B (T2, N0).  We again reviewed the differential diagnosis.  She understands this most likely is a primary bronchogenic carcinoma.  Infectious and inflammatory nodules are also in the differential diagnosis.  We discussed potential treatment options for lung cancer including surgery versus radiation therapy.  She understands relative advantages and disadvantages of each approach.  She was encouraged to consider radiation, but she remains uninterested in radiation and wants to have surgery, even with the understanding that it is a relatively high risk procedure in her case.  I described the proposed operation of robotic right VATS for wedge resection and probable right upper lobectomy.  We will base the decision to do  lobectomy on the intraoperative frozen section of the nodule.  I informed her and her husband of the need for general anesthesia, the incisions to be used, the use of a drainage tube postoperatively, the expected hospital stay, and the overall recovery.  I informed him of the indications, risk, benefits, and alternatives.  They understand the risks include, but not limited to death, MI, DVT, PE, bleeding, possible need for transfusion, infection, prolonged air leak, cardiac arrhythmias, rib fractures, chronic pain, as well as possibility of other unforeseeable complications.  She accepts the  risk and wishes to proceed.  Plan: Robotic right VATS for wedge resection and possible right upper lobectomy on Thursday, 07/29/2020  Melrose Nakayama, MD [facility] [phone]

## 2020-07-19 NOTE — H&P (View-Only) (Signed)
[address]       [place] [address]             [phone]    HPI: Jasmine [name] returns to further discuss management of her right upper lobe lung nodule  Jasmine [name] is a 65 year old woman with a history of asthma, COPD, tobacco abuse, reflux, hypertension, hyperglycemia, anemia, anxiety, chronic pain, sciatica, neuropathy, right foot drop, pancreatitis, pneumonia, and seizures as a child.  She was being evaluated for dyspnea.  A CT showed a 1.5 x 1.5 x 1.7 cm spiculated right upper lobe lung nodule.  The nodule was adjacent to and retracting the minor fissure.  There was a small subpleural nodule in the middle lobe along the fissure.  She had a PET/CT which showed the nodule was hypermetabolic with SUV of 5.5.  There was concern for enlarged main pulmonary arteries on the CT chest.  We did an echocardiogram which showed some right ventricular enlargement but normal systolic function and no evidence of right heart failure.  She has been participating in pulmonary rehab.  She continues to smoke.  She says she has cut back.  She has not been able to gain weight but her weight has been stable.  She still is adamantly in favor of surgery and does not want to do radiation.  She uses 2 L nasal cannula oxygen at night and as needed during the day. Zubrod Score: At the time of surgery this patient's most appropriate activity status/level should be described as: []     0    Normal activity, no symptoms [x]     1    Restricted in physical strenuous activity but ambulatory, able to do out light work []     2    Ambulatory and capable of self care, unable to do work activities, up and about >50 % of waking hours                              []     3    Only limited self care, in bed greater than 50% of waking hours []     4    Completely disabled, no self care, confined to bed or chair []     5    Moribund  Past Medical History:  Diagnosis Date  . Allergy   . Anemia   . Anxiety    . Arthritis   . Asthma   . Carpal tunnel syndrome   . Chronic back pain   . Cigarette smoker 08/25/2017  . Complication of anesthesia    in the past has had N/V, the last few surgeries she has not been  . COPD (chronic obstructive pulmonary disease) ([facility] [place])   . Easy bruising 07/10/2016  . GERD (gastroesophageal reflux disease)   . Headache   . Heart murmur 2005   never had any problems  . History of blood transfusion 2018  . History of kidney stones   . Hypertension   . Hypoglycemia   . Hypoglycemia   . Iron deficiency anemia 07/11/2016  . Neuropathy    both arms  . Normocytic anemia 07/29/2015  . Pancreatitis   . Pneumonia   . PONV (postoperative nausea and vomiting)   . Postoperative anemia due to acute blood loss 11/15/2015  . R lung cancer   . Sciatica   . Seizures ([place])    as a child when she would have an  asthma attack. none as an adult  . Shortness of breath dyspnea    Past Surgical History:  Procedure Laterality Date  . ABDOMINAL HYSTERECTOMY  2005  . APPLICATION OF ROBOTIC ASSISTANCE FOR SPINAL PROCEDURE N/A 09/05/2016   Procedure: APPLICATION OF ROBOTIC ASSISTANCE FOR SPINAL PROCEDURE;  Surgeon: Consuella Lose, MD;  Location: [facility];  Service: Neurosurgery;  Laterality: N/A;  . BIOPSY  06/14/2020   Procedure: BIOPSY;  Surgeon: Daneil Dolin, MD;  [facility];  Service: Endoscopy;;  . CERVICAL FUSION    . [place]  . COLONOSCOPY     unsure last  . COLONOSCOPY WITH PROPOFOL N/A 06/14/2020   Procedure: COLONOSCOPY WITH PROPOFOL;  Surgeon: Daneil Dolin, MD;  [facility];  Service: Endoscopy;  Laterality: N/A;  PM  . cyst removal face    . disk repair     neck and lumbar region  . ESOPHAGOGASTRODUODENOSCOPY (EGD) WITH PROPOFOL N/A 06/14/2020   Procedure: ESOPHAGOGASTRODUODENOSCOPY (EGD) WITH PROPOFOL;  Surgeon: Daneil Dolin, MD;  [facility];  Service: Endoscopy;  Laterality: N/A;  . FOOT SURGERY Bilateral    to  file bones down   . HARDWARE REMOVAL N/A 09/05/2016   Procedure: HARDWARE REMOVAL AND REPLACEMENT OF LUMBAR FIVE SCREWS;  Surgeon: Consuella Lose, MD;  Location: [facility];  Service: Neurosurgery;  Laterality: N/A;  . IMPLANTATION / PLACEMENT EPIDURAL NEUROSTIMULATOR ELECTRODES    . LAMINECTOMY     2006  . LUMBAR FUSION    . POLYPECTOMY  06/14/2020   Procedure: POLYPECTOMY INTESTINAL;  Surgeon: Daneil Dolin, MD;  [facility];  Service: Endoscopy;;  . SPINAL CORD STIMULATOR BATTERY EXCHANGE    . SPINAL CORD STIMULATOR INSERTION N/A 07/05/2016   Procedure: REPLACEMENT OF LUMBAR SPINAL CORD STIMULATOR BATTERY;  Surgeon: Newman Pies, MD;  [facility];  Service: Neurosurgery;  Laterality: N/A;  . TONSILLECTOMY     Social History   Socioeconomic History  . Marital status: Married    Spouse name: [name]  . Number of children: 1  . Years of education: Not on file  . Highest education level: Some college, no degree  Occupational History  . Not on file  Tobacco Use  . Smoking status: Current Every Day Smoker    Packs/day: 0.50    Years: 35.00    Pack years: 17.50    Types: Cigarettes  . Smokeless tobacco: Never Used  . Tobacco comment: trying to quit  Vaping Use  . Vaping Use: Never used  Substance and Sexual Activity  . Alcohol use: Yes    Alcohol/week: 2.0 standard drinks    Types: 2 Glasses of wine per week    Comment: occasionally  . Drug use: No  . Sexual activity: Not on file  Other Topics Concern  . Not on file  Social History Narrative   Retired, 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 1 son, deceased. 2 pet dogs. Enjoys watching TV.   Social Determinants of Health   Financial Resource Strain: Not on file  Food Insecurity: Not on file  Transportation Needs: Not on file  Physical Activity: Not on file  Stress: Not on file  Social Connections: Not on file  Intimate Partner Violence: Not At Risk  . Fear of Current or Ex-Partner: No  .  Emotionally Abused: No  . Physically Abused: No  . Sexually Abused: No   Current Outpatient Medications  Medication Sig Dispense Refill  .  acetaminophen (TYLENOL) 325 MG tablet Take 2 tablets (650 mg total) by mouth every 6 (six) hours as needed for mild pain, fever or headache (or Fever >/= 101). 12 tablet 2  . amLODipine (NORVASC) 10 MG tablet Take 1 tablet (10 mg total) by mouth daily. 90 tablet 3  . atorvastatin (LIPITOR) 40 MG tablet Take 1 tablet (40 mg total) by mouth daily. 90 tablet 3  . budesonide-formoterol (SYMBICORT) 160-4.5 MCG/ACT inhaler USE 2 INHALATIONS TWICE A DAY (Patient taking differently: Inhale 2 puffs into the lungs in the morning and at bedtime. USE 2 INHALATIONS TWICE A DAY) 20.4 g 2  . cetirizine (ZYRTEC) 10 MG tablet Take 10 mg by mouth daily as needed for allergies. Reported on 03/12/2015    . Cholecalciferol 125 MCG (5000 UT) capsule Take 5,000 Units daily by mouth.    . diclofenac sodium (VOLTAREN) 1 % GEL Apply 1 application topically daily as needed for muscle pain.    . DULoxetine (CYMBALTA) 30 MG capsule Take 3 capsules (90 mg total) by mouth daily. 270 capsule 4  . feeding supplement, ENSURE ENLIVE, (ENSURE ENLIVE) LIQD Take 237 mLs by mouth 2 (two) times daily between meals. 237 mL 12  . guaiFENesin (MUCINEX) 600 MG 12 hr tablet Take 600 mg by mouth 2 (two) times daily.    Marland Kitchen HYDROcodone Bitartrate ER 60 MG T24A Take 1 tablet by mouth daily.    Marland Kitchen HYDROcodone-acetaminophen (NORCO) 7.5-325 MG tablet Take 1 tablet by mouth 4 (four) times daily.    Marland Kitchen ipratropium-albuterol (DUONEB) 0.5-2.5 (3) MG/3ML SOLN Inhale 3 mLs into the lungs 4 (four) times daily as needed (for shortness of breath/wheezing). *May use as needed    . Iron Polysacch Cmplx-B12-FA (POLY-IRON 150 FORTE) 150-0.025-1 MG CAPS Take 1 capsule by mouth 2 (two) times daily. (Patient taking differently: Take 1 capsule by mouth daily.) 60 capsule 2  . L-Methylfolate-Algae-B12-B6 (METANX) 3-90.314-2-35 MG  CAPS TAKE 1 TABLET BY MOUTH TWO TIMES A DAY (Patient taking differently: Take 1 tablet by mouth daily.) 180 capsule 4  . levalbuterol (XOPENEX HFA) 45 MCG/ACT inhaler Inhale 2 puffs into the lungs every 6 (six) hours as needed for wheezing or shortness of breath. 1 each 12  . magnesium oxide (MAG-OX) 400 (241.3 Mg) MG tablet Take 1 tablet by mouth 2 (two) times daily.    . nicotine (NICODERM CQ) 14 mg/24hr patch Place 1 patch (14 mg total) onto the skin daily. 30 patch 0  . nicotine (NICODERM CQ) 7 mg/24hr patch Place 1 patch (7 mg total) onto the skin daily. 60 patch 0  . omeprazole (PRILOSEC) 40 MG capsule Take 1 capsule (40 mg total) by mouth 2 (two) times daily. Take 1 capsule 30 mins before breakfast and evening meal. 90 capsule 3  . ondansetron (ZOFRAN-ODT) 8 MG disintegrating tablet Take 8 mg by mouth every 8 (eight) hours as needed.    . OXYGEN 2lpm with sleep and as needed during the day    . SPIRIVA RESPIMAT 2.5 MCG/ACT AERS USE 2 INHALATIONS DAILY 12 g 3  . tizanidine (ZANAFLEX) 6 MG capsule Take 6 mg by mouth 3 (three) times daily.    . traZODone (DESYREL) 150 MG tablet TAKE 1 TO 2 TABLETS AT BEDTIME (Patient taking differently: Take 150-300 mg by mouth at bedtime.) 180 tablet 3   No current facility-administered medications for this visit.    Physical Exam BP (!) 150/80   Pulse 100   Temp 98.1 F (36.7 C) (Skin)  Resp 20   Ht 5\' 3"  (1.6 m)   Wt 94 lb (42.6 kg)   SpO2 91% Comment: RA  BMI 16.37 kg/m  65 year old woman in no acute distress Alert and oriented x3 with no focal deficits Thin No cervical or supraclavicular adenopathy Lungs diminished breath sounds bilaterally, no wheezing or rails Cardiac regular rate and rhythm with 2/6 systolic murmur Abdomen benign Mild clubbing, no cyanosis or edema No peripheral edema  Diagnostic Tests: NUCLEAR MEDICINE PET SKULL BASE TO THIGH  TECHNIQUE: 6.5 mCi F-18 FDG was injected intravenously. Full-ring PET imaging was  performed from the skull base to thigh after the radiotracer. CT data was obtained and used for attenuation correction and anatomic localization.  Fasting blood glucose: 97 mg/dl  COMPARISON:  CT chest 03/19/2020  FINDINGS: Mediastinal blood pool activity: SUV max 2.01  Liver activity: SUV max NA  NECK: No hypermetabolic lymph nodes in the neck.  Incidental CT findings: none  CHEST: No hypermetabolic mediastinal or hilar nodes. The right upper lobe pulmonary nodule is again seen measuring 1.7 cm with an SUV max of 5.5, image 105/3. Adjacent perifissural nodule is stable measuring 0.6 cm within SUV max of 0.48.  Incidental CT findings: Moderate changes of emphysema. Aortic atherosclerosis. Coronary artery atherosclerotic calcifications.  ABDOMEN/PELVIS: No abnormal hypermetabolic activity within the liver, pancreas, adrenal glands, or spleen. No hypermetabolic lymph nodes in the abdomen or pelvis.  Incidental CT findings: Diffuse hepatic steatosis.  SKELETON: No focal hypermetabolic activity to suggest skeletal metastasis.  Incidental CT findings: Postop change from lumbar spine fusion identified. Dorsal column stimulator is identified within the thoracic spine.  IMPRESSION: 1. Right upper lobe lung nodule is FDG avid and concerning for primary pulmonary neoplasm. No signs of FDG avid nodal metastasis or distant metastatic disease. 2. Aortic Atherosclerosis (ICD10-I70.0) and Emphysema (ICD10-J43.9). 3. Coronary artery calcifications. 4. Hepatic steatosis.   Electronically Signed   By: Kerby Moors M.D.   On: 04/20/2020 10:03 I personally reviewed the CT and PET/CT images again.  There is a hypermetabolic right upper lobe nodule that is highly suspicious for a new primary bronchogenic carcinoma.  No evidence of regional or distant metastatic disease.  Pulmonary function testing FVC 2.25 (77%) FEV1 1.41 (62%) FEV1 1.61 (72%)  postbronchodilator DLCO 11.74 (55%)  Impression: Jasmine [name] is a 65 year old woman with a history of asthma, COPD, tobacco abuse, reflux, hypertension, hyperglycemia, anemia, anxiety, chronic pain, sciatica, neuropathy, right foot drop, pancreatitis, and pneumonia.  She was found to have a right upper lobe lung nodule on a CT that was done to evaluate for dyspnea.  She had a PET/CT which showed nodules hypermetabolic.  There was no evidence of regional or distant metastatic disease.  She has clinical stage Ia (T1, N0) disease although there is a possibility of visceral pleural involvement and may be 1B (T2, N0).  We again reviewed the differential diagnosis.  She understands this most likely is a primary bronchogenic carcinoma.  Infectious and inflammatory nodules are also in the differential diagnosis.  We discussed potential treatment options for lung cancer including surgery versus radiation therapy.  She understands relative advantages and disadvantages of each approach.  She was encouraged to consider radiation, but she remains uninterested in radiation and wants to have surgery, even with the understanding that it is a relatively high risk procedure in her case.  I described the proposed operation of robotic right VATS for wedge resection and probable right upper lobectomy.  We will base the decision to do  lobectomy on the intraoperative frozen section of the nodule.  I informed her and her husband of the need for general anesthesia, the incisions to be used, the use of a drainage tube postoperatively, the expected hospital stay, and the overall recovery.  I informed him of the indications, risk, benefits, and alternatives.  They understand the risks include, but not limited to death, MI, DVT, PE, bleeding, possible need for transfusion, infection, prolonged air leak, cardiac arrhythmias, rib fractures, chronic pain, as well as possibility of other unforeseeable complications.  She accepts the  risk and wishes to proceed.  Plan: Robotic right VATS for wedge resection and possible right upper lobectomy on Thursday, 07/29/2020  Melrose Nakayama, MD [facility] [phone]

## 2020-07-20 ENCOUNTER — Ambulatory Visit: Payer: Medicare Other | Admitting: Nurse Practitioner

## 2020-07-20 ENCOUNTER — Telehealth: Payer: Self-pay

## 2020-07-20 ENCOUNTER — Encounter (HOSPITAL_COMMUNITY): Payer: Medicare Other

## 2020-07-20 ENCOUNTER — Telehealth (HOSPITAL_COMMUNITY): Payer: Self-pay | Admitting: Surgery

## 2020-07-20 NOTE — Progress Notes (Signed)
Discharge Progress Report  Patient Details  Name: Jasmine [name] MRN: [id number] Date of Birth: [birth year] Referring Provider:   [place] from 06/16/2020 in [place]  Referring Provider Roxan Hockey       Number of Visits: 7  Reason for Discharge:  Early Exit:  Personal  Smoking History:  Social History   Tobacco Use  Smoking Status Current Every Day Smoker  . Packs/day: 0.50  . Years: 35.00  . Pack years: 17.50  . Types: Cigarettes  Smokeless Tobacco Never Used  Tobacco Comment   trying to quit    Diagnosis:  Dyspnea, unspecified type  Asthma, unspecified asthma severity, unspecified whether complicated, unspecified whether persistent  ADL UCSD:  Pulmonary Assessment Scores    Row Name 06/16/20 1443         ADL UCSD   ADL Phase Entry     SOB Score total 103     Rest 2     Walk 3     Stairs 5     Bath 4     Dress 5     Shop 5           CAT Score   CAT Score 34           mMRC Score   mMRC Score 3            Initial Exercise Prescription:  Initial Exercise Prescription - 06/16/20 1400      Date of Initial Exercise RX and Referring Provider   Date 06/16/20    Referring Provider Roxan Hockey    Expected Discharge Date 10/19/20      NuStep   Level 1    SPM 60    Minutes 22      Arm Ergometer   Level 1    RPM 50    Minutes 17      Prescription Details   Frequency (times per week) 2    Duration Progress to 30 minutes of continuous aerobic without signs/symptoms of physical distress      Intensity   THRR 40-80% of Max Heartrate 62-123    Ratings of Perceived Exertion 11-13    Perceived Dyspnea 0-4      Progression   Progression Continue to progress workloads to maintain intensity without signs/symptoms of physical distress.      Resistance Training   Training Prescription Yes    Weight 2    Reps 10-15           Discharge Exercise Prescription (Final Exercise  Prescription Changes):  Exercise Prescription Changes - 07/06/20 1400      Response to Exercise   Blood Pressure (Admit) 128/76    Blood Pressure (Exercise) 138/76    Blood Pressure (Exit) 112/68    Heart Rate (Admit) 113 bpm    Heart Rate (Exercise) 115 bpm    Heart Rate (Exit) 99 bpm    Oxygen Saturation (Admit) 94 %    Oxygen Saturation (Exercise) 94 %    Oxygen Saturation (Exit) 94 %    Rating of Perceived Exertion (Exercise) 14    Perceived Dyspnea (Exercise) 15    Duration Continue with 30 min of aerobic exercise without signs/symptoms of physical distress.    Intensity THRR unchanged      Progression   Progression Continue to progress workloads to maintain intensity without signs/symptoms of physical distress.      Resistance Training   Training Prescription Yes    Weight  1 lb    Reps 10-15    Time 10 Minutes      NuStep   Level 1    SPM 74    Minutes 17    METs 1.5      Arm Ergometer   Level 1    RPM 46    Minutes 22    METs 2.3           Functional Capacity:  6 Minute Walk    Row Name 06/16/20 1424         6 Minute Walk   Phase Initial     Distance 700 feet     Walk Time 6 minutes     # of Rest Breaks 0     MPH 1.3     METS 2.63     RPE 11     Perceived Dyspnea  11     VO2 Peak 9.19     Symptoms No     Resting HR 97 bpm     Resting BP 124/60     Resting Oxygen Saturation  90 %     Exercise Oxygen Saturation  during 6 min walk 90 %     Max Ex. HR 112 bpm     Max Ex. BP 130/64     2 Minute Post BP 124/62           Interval HR   1 Minute HR 106     2 Minute HR 110     3 Minute HR 110     4 Minute HR 112     5 Minute HR 111     6 Minute HR 112     2 Minute Post HR 99     Interval Heart Rate? Yes           Interval Oxygen   Interval Oxygen? Yes     Baseline Oxygen Saturation % 90 %     1 Minute Oxygen Saturation % 91 %     1 Minute Liters of Oxygen 0 L     2 Minute Oxygen Saturation % 90 %     2 Minute Liters of Oxygen 0 L      3 Minute Oxygen Saturation % 90 %     3 Minute Liters of Oxygen 0 L     4 Minute Oxygen Saturation % 92 %     4 Minute Liters of Oxygen 0 L     5 Minute Oxygen Saturation % 91 %     5 Minute Liters of Oxygen 0 L     6 Minute Oxygen Saturation % 91 %     6 Minute Liters of Oxygen 0 L     2 Minute Post Oxygen Saturation % 93 %     2 Minute Post Liters of Oxygen 0 L            Psychological, QOL, Others - Outcomes: PHQ 2/9: Depression screen [facility] 2/9 07/09/2020 06/16/2020 03/29/2020 03/10/2020 08/20/2019  Decreased Interest 0 0 0 0 0  Down, Depressed, Hopeless 0 1 0 0 0  PHQ - 2 Score 0 1 0 0 0  Altered sleeping - 2 - 0 0  Tired, decreased energy - 3 - 0 0  Change in appetite - 1 - 0 0  Feeling bad or failure about yourself  - 1 - 0 0  Trouble concentrating - 2 - 0 0  Moving slowly or fidgety/restless - 2 - 0 0  Suicidal thoughts - 0 - 0 0  PHQ-9 Score - 12 - 0 0  Difficult doing work/chores - Very difficult - - -  Some recent data might be hidden    Quality of Life:  Quality of Life - 06/16/20 1428      Quality of Life   Select Quality of Life      Quality of Life Scores   Health/Function Pre 3.77 %    Socioeconomic Pre 7.6 %    Psych/Spiritual Pre 14.75 %    Family Pre 10.63 %    GLOBAL Pre 7.52 %           Personal Goals: Goals established at orientation with interventions provided to work toward goal.  Personal Goals and Risk Factors at Admission - 06/16/20 1457      Core Components/Risk Factors/Patient Goals on Admission    Weight Management Weight Maintenance    Tobacco Cessation Yes    Number of packs per day 1/2 ppd    Intervention Assist the participant in steps to quit. Provide individualized education and counseling about committing to Tobacco Cessation, relapse prevention, and pharmacological support that can be provided by physician.    Improve shortness of breath with ADL's Yes    Intervention Provide education, individualized exercise plan and daily  activity instruction to help decrease symptoms of SOB with activities of daily living.    Expected Outcomes Short Term: Improve cardiorespiratory fitness to achieve a reduction of symptoms when performing ADLs;Long Term: Be able to perform more ADLs without symptoms or delay the onset of symptoms    Stress Yes    Intervention Offer individual and/or small group education and counseling on adjustment to heart disease, stress management and health-related lifestyle change. Teach and support self-help strategies.;Refer participants experiencing significant psychosocial distress to appropriate mental health specialists for further evaluation and treatment. When possible, include family members and significant others in education/counseling sessions.    Expected Outcomes Short Term: Participant demonstrates changes in health-related behavior, relaxation and other stress management skills, ability to obtain effective social support, and compliance with psychotropic medications if prescribed.;Long Term: Emotional wellbeing is indicated by absence of clinically significant psychosocial distress or social isolation.    Personal Goal Other Yes    Personal Goal Get strong enough to have lung nodule surgery; have less SOB with walking; be able to help her husband with cleaning house.    Intervention Provide pulmonary rehab program with patient supplemental exercise at home.    Expected Outcomes Patient will complete the program meeting both personal and program goals.            Personal Goals Discharge:  Goals and Risk Factor Review    Row Name 06/28/20 1516             Core Components/Risk Factors/Patient Goals Review   Personal Goals Review Tobacco Cessation;Improve shortness of breath with ADL's;Stress       Review Patient is new to PR 2 sessions. She has O2 sat between 91-95% on RA . We will encourage breathing techniques such as purse lipped breathing.   Her personal goals are to be able to walk  better with less SOB and get strong enough to have lung surgery.       Expected Outcomes We will continue to monitor as she progresses in the program.              Exercise Goals and Review:  Exercise Goals    Row Name 06/16/20 1427  07/06/20 1416           Exercise Goals   Increase Physical Activity Yes Yes      Intervention Develop an individualized exercise prescription for aerobic and resistive training based on initial evaluation findings, risk stratification, comorbidities and participant's personal goals.;Provide advice, education, support and counseling about physical activity/exercise needs. Develop an individualized exercise prescription for aerobic and resistive training based on initial evaluation findings, risk stratification, comorbidities and participant's personal goals.;Provide advice, education, support and counseling about physical activity/exercise needs.      Expected Outcomes Short Term: Attend rehab on a regular basis to increase amount of physical activity.;Long Term: Add in home exercise to make exercise part of routine and to increase amount of physical activity.;Long Term: Exercising regularly at least 3-5 days a week. Short Term: Attend rehab on a regular basis to increase amount of physical activity.;Long Term: Add in home exercise to make exercise part of routine and to increase amount of physical activity.;Long Term: Exercising regularly at least 3-5 days a week.      Increase Strength and Stamina Yes Yes      Intervention Provide advice, education, support and counseling about physical activity/exercise needs.;Develop an individualized exercise prescription for aerobic and resistive training based on initial evaluation findings, risk stratification, comorbidities and participant's personal goals. Provide advice, education, support and counseling about physical activity/exercise needs.;Develop an individualized exercise prescription for aerobic and resistive training  based on initial evaluation findings, risk stratification, comorbidities and participant's personal goals.      Expected Outcomes Short Term: Increase workloads from initial exercise prescription for resistance, speed, and METs.;Short Term: Perform resistance training exercises routinely during rehab and add in resistance training at home;Long Term: Improve cardiorespiratory fitness, muscular endurance and strength as measured by increased METs and functional capacity (6MWT) Short Term: Increase workloads from initial exercise prescription for resistance, speed, and METs.;Short Term: Perform resistance training exercises routinely during rehab and add in resistance training at home;Long Term: Improve cardiorespiratory fitness, muscular endurance and strength as measured by increased METs and functional capacity (6MWT)      Able to understand and use rate of perceived exertion (RPE) scale Yes Yes      Intervention Provide education and explanation on how to use RPE scale Provide education and explanation on how to use RPE scale      Expected Outcomes Short Term: Able to use RPE daily in rehab to express subjective intensity level;Long Term:  Able to use RPE to guide intensity level when exercising independently Short Term: Able to use RPE daily in rehab to express subjective intensity level;Long Term:  Able to use RPE to guide intensity level when exercising independently      Able to understand and use Dyspnea scale Yes Yes      Intervention Provide education and explanation on how to use Dyspnea scale Provide education and explanation on how to use Dyspnea scale      Expected Outcomes Short Term: Able to use Dyspnea scale daily in rehab to express subjective sense of shortness of breath during exertion;Long Term: Able to use Dyspnea scale to guide intensity level when exercising independently Short Term: Able to use Dyspnea scale daily in rehab to express subjective sense of shortness of breath during  exertion;Long Term: Able to use Dyspnea scale to guide intensity level when exercising independently      Knowledge and understanding of Target Heart Rate Range (THRR) Yes Yes      Intervention Provide education and explanation  of THRR including how the numbers were predicted and where they are located for reference Provide education and explanation of THRR including how the numbers were predicted and where they are located for reference      Expected Outcomes Short Term: Able to state/look up THRR;Long Term: Able to use THRR to govern intensity when exercising independently;Short Term: Able to use daily as guideline for intensity in rehab Short Term: Able to state/look up THRR;Long Term: Able to use THRR to govern intensity when exercising independently;Short Term: Able to use daily as guideline for intensity in rehab      Able to check pulse independently Yes Yes      Intervention Provide education and demonstration on how to check pulse in carotid and radial arteries.;Review the importance of being able to check your own pulse for safety during independent exercise Provide education and demonstration on how to check pulse in carotid and radial arteries.;Review the importance of being able to check your own pulse for safety during independent exercise      Expected Outcomes Short Term: Able to explain why pulse checking is important during independent exercise;Long Term: Able to check pulse independently and accurately Short Term: Able to explain why pulse checking is important during independent exercise;Long Term: Able to check pulse independently and accurately      Understanding of Exercise Prescription Yes Yes      Intervention Provide education, explanation, and written materials on patient's individual exercise prescription Provide education, explanation, and written materials on patient's individual exercise prescription      Expected Outcomes Short Term: Able to explain program exercise  prescription;Long Term: Able to explain home exercise prescription to exercise independently Short Term: Able to explain program exercise prescription;Long Term: Able to explain home exercise prescription to exercise independently             Exercise Goals Re-Evaluation:  Exercise Goals Re-Evaluation    Row Name 07/06/20 1417             Exercise Goal Re-Evaluation   Exercise Goals Review Increase Physical Activity;Increase Strength and Stamina;Able to understand and use rate of perceived exertion (RPE) scale;Able to understand and use Dyspnea scale;Knowledge and understanding of Target Heart Rate Range (THRR);Able to check pulse independently;Understanding of Exercise Prescription       Comments Patient has completed 5 exercise sessions. She has tolerated exercise well with no complaints. She is deconditioned but making progress in the program. She is eager to get healthier and stronger. She is very excited about coming to rehab and thinks it is helping her. She is currently exercising at 1.5 METs on the NuStep. Will continue to monitor and progress as able.       Expected Outcomes Through exercise at rehab and at home patient will achieve their goals.              Nutrition & Weight - Outcomes:  Pre Biometrics - 07/06/20 1441      Pre Biometrics   Weight 43.4 kg    BMI (Calculated) 16.95            Nutrition:  Nutrition Therapy & Goals - 06/28/20 1505      Personal Nutrition Goals   Comments Nutritional handout given to patient on how to choose heart healthy meals.      Intervention Plan   Intervention Nutrition handout(s) given to patient.           Nutrition Discharge:  Nutrition Assessments - 06/16/20 1431  MEDFICTS Scores   Pre Score 27           Education Questionnaire Score:  Knowledge Questionnaire Score - 06/16/20 1432      Knowledge Questionnaire Score   Pre Score 12/18          Patient was discharged due to having a surgery coming  up. Once she is recovered from surgery she can get another referral to come back and restart the program.

## 2020-07-20 NOTE — Telephone Encounter (Signed)
Pt had left a message stating that she was having surgery this Thursday, so she needed to cancel her appointment in June.  She stated that Dr. Roxan Hockey told her it may take up to 2 months before she would be strong enough to come back to our office for her follow up.  I notified Dr. Delton Coombes, and he stated that the pt could come in the last week of June or the first week of July.  Amy called the pt and got her rescheduled for July 6 with labs and a follow up appointment.

## 2020-07-21 ENCOUNTER — Ambulatory Visit (HOSPITAL_COMMUNITY)
Admission: RE | Admit: 2020-07-21 | Discharge: 2020-07-21 | Disposition: A | Discharge status: Home / Self Care | Payer: Medicare Other | Source: Ambulatory Visit | Attending: Thoracic Surgery (Cardiothoracic Vascular Surgery) | Admitting: Thoracic Surgery (Cardiothoracic Vascular Surgery)

## 2020-07-21 ENCOUNTER — Other Ambulatory Visit: Payer: Self-pay

## 2020-07-21 DIAGNOSIS — R911 Solitary pulmonary nodule: Secondary | ICD-10-CM | POA: Diagnosis present

## 2020-07-22 ENCOUNTER — Encounter (HOSPITAL_COMMUNITY): Payer: Medicare Other

## 2020-07-26 NOTE — Progress Notes (Signed)
Surgical Instructions    Your procedure is scheduled on 07/29/20.  Report to [facility] Main Entrance "A" at 05:30 A.M., then check in with the Admitting office.  Call this number if you have problems the morning of surgery:  [phone]   If you have any questions prior to your surgery date call [phone]: Open Monday-Friday 8am-4pm    Remember:  Do not eat or drink after midnight the night before your surgery      Take these medicines the morning of surgery with A SIP OF WATER  acetaminophen (TYLENOL) if needed amLODipine (NORVASC) atorvastatin (LIPITOR) budesonide-formoterol (SYMBICORT)  cetirizine (ZYRTEC) if needed DULoxetine (CYMBALTA) guaiFENesin (MUCINEX) HYDROcodone Bitartrate ER HYDROcodone-acetaminophen (NORCO) ipratropium-albuterol (DUONEB) nebulizer if needed levalbuterol (XOPENEX HFA) inhaler  if needed omeprazole (PRILOSEC) ondansetron (ZOFRAN-ODT) if needed SPIRIVA RESPIMAT  tizanidine (ZANAFLEX)  As of today, STOP taking any Aspirin (unless otherwise instructed by your surgeon) Aleve, Naproxen, Ibuprofen, Motrin, Advil, Goody's, BC's, all herbal medications, fish oil, diclofenac sodium (VOLTAREN) and all vitamins.                     Do not wear jewelry, make up, or nail polish            Do not wear lotions, powders, perfumes, or deodorant.            Do not shave 48 hours prior to surgery.              Do not bring valuables to the hospital.            [facility] is not responsible for any belongings or valuables.  Do NOT Smoke (Tobacco/Vaping) or drink Alcohol 24 hours prior to your procedure If you use a CPAP at night, you may bring all equipment for your overnight stay.   Contacts, glasses, dentures or bridgework may not be worn into surgery, please bring cases for these belongings   For patients admitted to the hospital, discharge time will be determined by your treatment team.   Patients discharged the day of surgery will not be allowed to  drive home, and someone needs to stay with them for 24 hours.    Special instructions:    Oral Hygiene is also important to reduce your risk of infection.  Remember - BRUSH YOUR TEETH THE MORNING OF SURGERY WITH YOUR REGULAR TOOTHPASTE   [place]- Preparing For Surgery  Before surgery, you can play an important role. Because skin is not sterile, your skin needs to be as free of germs as possible. You can reduce the number of germs on your skin by washing with CHG (chlorahexidine gluconate) Soap before surgery.  CHG is an antiseptic cleaner which kills germs and bonds with the skin to continue killing germs even after washing.     Please do not use if you have an allergy to CHG or antibacterial soaps. If your skin becomes reddened/irritated stop using the CHG.  Do not shave (including legs and underarms) for at least 48 hours prior to first CHG shower. It is OK to shave your face.  Please follow these instructions carefully.    1.  Shower the NIGHT BEFORE SURGERY and the MORNING OF SURGERY with CHG Soap.   If you chose to wash your hair, wash your hair first as usual with your normal shampoo. After you shampoo, rinse your hair and body thoroughly to remove the shampoo.  Then [employer] and genitals (private parts) with your normal soap and  rinse thoroughly to remove soap.  2. After that Use CHG Soap as you would any other liquid soap. You can apply CHG directly to the skin and wash gently with a scrungie or a clean washcloth.   3. Apply the CHG Soap to your body ONLY FROM THE NECK DOWN.  Do not use on open wounds or open sores. Avoid contact with your eyes, ears, mouth and genitals (private parts). Wash Face and genitals (private parts)  with your normal soap.   4. Wash thoroughly, paying special attention to the area where your surgery will be performed.  5. Thoroughly rinse your body with warm water from the neck down.  6. DO NOT shower/wash with your normal soap after using and  rinsing off the CHG Soap.  7. Pat yourself dry with a CLEAN TOWEL.  8. Wear CLEAN PAJAMAS to bed the night before surgery  9. Place CLEAN SHEETS on your bed the night before your surgery  10. DO NOT SLEEP WITH PETS.   Day of Surgery: Take a shower with CHG soap. Wear Clean/Comfortable clothing the morning of surgery Do not apply any deodorants/lotions.   Remember to brush your teeth WITH YOUR REGULAR TOOTHPASTE.   Please read over the following fact sheets that you were given.

## 2020-07-27 ENCOUNTER — Other Ambulatory Visit: Payer: Self-pay

## 2020-07-27 ENCOUNTER — Ambulatory Visit (HOSPITAL_COMMUNITY)
Admission: RE | Admit: 2020-07-27 | Discharge: 2020-07-27 | Disposition: A | Discharge status: Home / Self Care | Payer: Medicare Other | Source: Ambulatory Visit | Attending: Thoracic Surgery (Cardiothoracic Vascular Surgery) | Admitting: Thoracic Surgery (Cardiothoracic Vascular Surgery)

## 2020-07-27 ENCOUNTER — Encounter (HOSPITAL_COMMUNITY)
Admission: RE | Admit: 2020-07-27 | Discharge: 2020-07-27 | Disposition: A | Discharge status: Home / Self Care | Payer: Medicare Other | Source: Ambulatory Visit | Attending: Thoracic Surgery (Cardiothoracic Vascular Surgery) | Admitting: Thoracic Surgery (Cardiothoracic Vascular Surgery)

## 2020-07-27 ENCOUNTER — Encounter (HOSPITAL_COMMUNITY): Payer: Medicare Other

## 2020-07-27 ENCOUNTER — Encounter (HOSPITAL_COMMUNITY): Payer: Self-pay

## 2020-07-27 DIAGNOSIS — Z20822 Contact with and (suspected) exposure to covid-19: Secondary | ICD-10-CM | POA: Insufficient documentation

## 2020-07-27 DIAGNOSIS — R911 Solitary pulmonary nodule: Secondary | ICD-10-CM | POA: Diagnosis present

## 2020-07-27 DIAGNOSIS — J439 Emphysema, unspecified: Secondary | ICD-10-CM | POA: Insufficient documentation

## 2020-07-27 DIAGNOSIS — Z01818 Encounter for other preprocedural examination: Secondary | ICD-10-CM | POA: Diagnosis present

## 2020-07-27 LAB — CBC
HCT: 39.1 % (ref 36.0–46.0)
Hemoglobin: 12.1 g/dL (ref 12.0–15.0)
MCH: 32.8 pg (ref 26.0–34.0)
MCHC: 30.9 g/dL (ref 30.0–36.0)
MCV: 106 fL — ABNORMAL HIGH (ref 80.0–100.0)
Platelets: 251 10*3/uL (ref 150–400)
RBC: 3.69 MIL/uL — ABNORMAL LOW (ref 3.87–5.11)
RDW: 15.7 % — ABNORMAL HIGH (ref 11.5–15.5)
WBC: 5.4 10*3/uL (ref 4.0–10.5)
nRBC: 0 % (ref 0.0–0.2)

## 2020-07-27 LAB — URINALYSIS, ROUTINE W REFLEX MICROSCOPIC
Bilirubin Urine: NEGATIVE
Glucose, UA: NEGATIVE mg/dL
Hgb urine dipstick: NEGATIVE
Ketones, ur: NEGATIVE mg/dL
Leukocytes,Ua: NEGATIVE
Nitrite: NEGATIVE
Protein, ur: 30 mg/dL — AB
Specific Gravity, Urine: 1.017 (ref 1.005–1.030)
pH: 5 (ref 5.0–8.0)

## 2020-07-27 LAB — SARS CORONAVIRUS 2 (TAT 6-24 HRS): SARS Coronavirus 2: NEGATIVE

## 2020-07-27 LAB — BLOOD GAS, ARTERIAL
Acid-base deficit: 2.9 mmol/L — ABNORMAL HIGH (ref 0.0–2.0)
Bicarbonate: 21.9 mmol/L (ref 20.0–28.0)
Drawn by: 606861
FIO2: 21
O2 Saturation: 93.2 %
Patient temperature: 37
pCO2 arterial: 41.8 mmHg (ref 32.0–48.0)
pH, Arterial: 7.34 — ABNORMAL LOW (ref 7.350–7.450)
pO2, Arterial: 76.1 mmHg — ABNORMAL LOW (ref 83.0–108.0)

## 2020-07-27 LAB — COMPREHENSIVE METABOLIC PANEL
ALT: 31 U/L (ref 0–44)
AST: 48 U/L — ABNORMAL HIGH (ref 15–41)
Albumin: 3.6 g/dL (ref 3.5–5.0)
Alkaline Phosphatase: 71 U/L (ref 38–126)
Anion gap: 15 (ref 5–15)
BUN: 12 mg/dL (ref 8–23)
CO2: 20 mmol/L — ABNORMAL LOW (ref 22–32)
Calcium: 8.9 mg/dL (ref 8.9–10.3)
Chloride: 104 mmol/L (ref 98–111)
Creatinine, Ser: 0.5 mg/dL (ref 0.44–1.00)
GFR, Estimated: >60 mL/min (ref >60)
Glucose, Bld: 83 mg/dL (ref 70–99)
Potassium: 3.7 mmol/L (ref 3.5–5.1)
Sodium: 139 mmol/L (ref 135–145)
Total Bilirubin: 0.9 mg/dL (ref 0.3–1.2)
Total Protein: 6.9 g/dL (ref 6.5–8.1)

## 2020-07-27 LAB — SURGICAL PCR SCREEN
MRSA, PCR: NEGATIVE
Staphylococcus aureus: NEGATIVE

## 2020-07-27 LAB — TYPE AND SCREEN
ABO/RH(D): A POS
Antibody Screen: NEGATIVE

## 2020-07-27 LAB — APTT: aPTT: 30 seconds (ref 24–36)

## 2020-07-27 LAB — PROTIME-INR
INR: 0.8 (ref 0.8–1.2)
Prothrombin Time: 11.5 seconds (ref 11.4–15.2)

## 2020-07-27 NOTE — Progress Notes (Signed)
PCP - Ronnie Doss, DO Cardiologist - Denies  Chest x-ray - 07/27/20 EKG - 07/27/20 Stress Test - Denies ECHO - 05/06/20 Cardiac Cath - Denies  Sleep Study - 03/09/20 No OSA  DM - Denies  COVID TEST- 07/27/20  Anesthesia review: No  Patient denies shortness of breath, fever, cough and chest pain at PAT appointment   All instructions explained to the patient, with a verbal understanding of the material. Patient agrees to go over the instructions while at home for a better understanding. The opportunity to ask questions was provided.

## 2020-07-28 NOTE — Anesthesia Preprocedure Evaluation (Addendum)
Anesthesia Evaluation  Patient identified by MRN, date of birth, ID band Patient awake    Reviewed: Allergy & Precautions, NPO status , Patient's Chart, lab work & pertinent test results  History of Anesthesia Complications (+) PONV and history of anesthetic complications  Airway Mallampati: II  TM Distance: >3 FB Neck ROM: Full    Dental  (+) Dental Advisory Given   Pulmonary shortness of breath, asthma , COPD,  COPD inhaler and oxygen dependent, Current Smoker and Patient abstained from smoking.,    breath sounds clear to auscultation       Cardiovascular hypertension, Pt. on medications  Rhythm:Regular Rate:Normal  Normal EF. Mild MR.    Neuro/Psych  Headaches,  Neuromuscular disease    GI/Hepatic Neg liver ROS, GERD  Medicated,  Endo/Other  negative endocrine ROS  Renal/GU negative Renal ROS     Musculoskeletal  (+) Arthritis ,   Abdominal   Peds  Hematology  (+) anemia ,   Anesthesia Other Findings   Reproductive/Obstetrics                            Lab Results  Component Value Date   WBC 5.4 07/27/2020   HGB 12.1 07/27/2020   HCT 39.1 07/27/2020   MCV 106.0 (H) 07/27/2020   PLT 251 07/27/2020   Lab Results  Component Value Date   CREATININE 0.50 07/27/2020   BUN 12 07/27/2020   NA 139 07/27/2020   K 3.7 07/27/2020   CL 104 07/27/2020   CO2 20 (L) 07/27/2020    Anesthesia Physical Anesthesia Plan  ASA: IV  Anesthesia Plan: General   Post-op Pain Management:    Induction: Intravenous  PONV Risk Score and Plan: 4 or greater and Ondansetron, Dexamethasone, Midazolam, Treatment may vary due to age or medical condition, Scopolamine patch - Pre-op, Propofol infusion and Aprepitant  Airway Management Planned: Double Lumen EBT  Additional Equipment: Arterial line  Intra-op Plan:   Post-operative Plan: Extubation in OR and Possible Post-op  intubation/ventilation  Informed Consent: I have reviewed the patients History and Physical, chart, labs and discussed the procedure including the risks, benefits and alternatives for the proposed anesthesia with the patient or authorized representative who has indicated his/her understanding and acceptance.     Dental advisory given  Plan Discussed with: CRNA  Anesthesia Plan Comments: (22F Left DLT. Aline, GETA.)      Anesthesia Quick Evaluation

## 2020-07-29 ENCOUNTER — Other Ambulatory Visit: Payer: Self-pay

## 2020-07-29 ENCOUNTER — Inpatient Hospital Stay (HOSPITAL_COMMUNITY)
Admission: RE | Admit: 2020-07-29 | Discharge: 2020-08-03 | DRG: 164 | Disposition: A | Discharge status: Home / Self Care | Payer: Medicare Other | Source: Ambulatory Visit | Attending: Thoracic Surgery (Cardiothoracic Vascular Surgery) | Admitting: Thoracic Surgery (Cardiothoracic Vascular Surgery)

## 2020-07-29 ENCOUNTER — Encounter (HOSPITAL_COMMUNITY): Payer: Self-pay | Admitting: Thoracic Surgery (Cardiothoracic Vascular Surgery)

## 2020-07-29 ENCOUNTER — Encounter (HOSPITAL_COMMUNITY): Payer: Medicare Other

## 2020-07-29 ENCOUNTER — Inpatient Hospital Stay (HOSPITAL_COMMUNITY): Payer: Medicare Other | Admitting: Registered Nurse

## 2020-07-29 ENCOUNTER — Inpatient Hospital Stay (HOSPITAL_COMMUNITY): Payer: Medicare Other

## 2020-07-29 ENCOUNTER — Inpatient Hospital Stay (HOSPITAL_COMMUNITY): Payer: Medicare Other | Admitting: Emergency Medicine

## 2020-07-29 ENCOUNTER — Encounter (HOSPITAL_COMMUNITY)
Admission: RE | Disposition: A | Discharge status: Home / Self Care | Payer: Self-pay | Source: Ambulatory Visit | Attending: Thoracic Surgery (Cardiothoracic Vascular Surgery)

## 2020-07-29 ENCOUNTER — Ambulatory Visit: Payer: Medicare Other | Admitting: Gastroenterology

## 2020-07-29 DIAGNOSIS — J939 Pneumothorax, unspecified: Secondary | ICD-10-CM | POA: Diagnosis not present

## 2020-07-29 DIAGNOSIS — M21371 Foot drop, right foot: Secondary | ICD-10-CM | POA: Diagnosis present

## 2020-07-29 DIAGNOSIS — Z981 Arthrodesis status: Secondary | ICD-10-CM | POA: Diagnosis not present

## 2020-07-29 DIAGNOSIS — I2510 Atherosclerotic heart disease of native coronary artery without angina pectoris: Secondary | ICD-10-CM | POA: Diagnosis present

## 2020-07-29 DIAGNOSIS — J432 Centrilobular emphysema: Secondary | ICD-10-CM | POA: Diagnosis present

## 2020-07-29 DIAGNOSIS — G629 Polyneuropathy, unspecified: Secondary | ICD-10-CM | POA: Diagnosis present

## 2020-07-29 DIAGNOSIS — Z9682 Presence of neurostimulator: Secondary | ICD-10-CM | POA: Diagnosis not present

## 2020-07-29 DIAGNOSIS — C3411 Malignant neoplasm of upper lobe, right bronchus or lung: Secondary | ICD-10-CM | POA: Diagnosis present

## 2020-07-29 DIAGNOSIS — R911 Solitary pulmonary nodule: Secondary | ICD-10-CM | POA: Diagnosis present

## 2020-07-29 DIAGNOSIS — Z9981 Dependence on supplemental oxygen: Secondary | ICD-10-CM | POA: Diagnosis not present

## 2020-07-29 DIAGNOSIS — K219 Gastro-esophageal reflux disease without esophagitis: Secondary | ICD-10-CM | POA: Diagnosis present

## 2020-07-29 DIAGNOSIS — Z79899 Other long term (current) drug therapy: Secondary | ICD-10-CM | POA: Diagnosis not present

## 2020-07-29 DIAGNOSIS — Z902 Acquired absence of lung [part of]: Secondary | ICD-10-CM

## 2020-07-29 DIAGNOSIS — I700 Atherosclerosis of aorta: Secondary | ICD-10-CM | POA: Diagnosis present

## 2020-07-29 DIAGNOSIS — F17210 Nicotine dependence, cigarettes, uncomplicated: Secondary | ICD-10-CM | POA: Diagnosis present

## 2020-07-29 DIAGNOSIS — K760 Fatty (change of) liver, not elsewhere classified: Secondary | ICD-10-CM | POA: Diagnosis present

## 2020-07-29 DIAGNOSIS — G8929 Other chronic pain: Secondary | ICD-10-CM | POA: Diagnosis present

## 2020-07-29 DIAGNOSIS — Z4682 Encounter for fitting and adjustment of non-vascular catheter: Secondary | ICD-10-CM

## 2020-07-29 DIAGNOSIS — Z09 Encounter for follow-up examination after completed treatment for conditions other than malignant neoplasm: Secondary | ICD-10-CM

## 2020-07-29 DIAGNOSIS — I10 Essential (primary) hypertension: Secondary | ICD-10-CM | POA: Diagnosis present

## 2020-07-29 HISTORY — PX: INTERCOSTAL NERVE BLOCK: SHX5021

## 2020-07-29 HISTORY — PX: LYMPH NODE BIOPSY: SHX201

## 2020-07-29 SURGERY — WEDGE RESECTION, LUNG, ROBOT-ASSISTED, THORACOSCOPIC
Anesthesia: General | Site: Chest | Laterality: Right

## 2020-07-29 MED ORDER — ORAL CARE MOUTH RINSE: 15.0000 mL | Freq: Once | OROMUCOSAL | Status: DC

## 2020-07-29 MED ORDER — LACTATED RINGERS IV SOLN: INTRAVENOUS | Status: DC

## 2020-07-29 MED ORDER — LIDOCAINE 2% (20 MG/ML) 5 ML SYRINGE
INTRAMUSCULAR | Status: DC | PRN
  Administered 2020-07-29: 60 mg via INTRAVENOUS

## 2020-07-29 MED ORDER — SENNOSIDES-DOCUSATE SODIUM 8.6-50 MG PO TABS
1.0000 | ORAL_TABLET | Freq: Every day | ORAL | Status: DC
  Administered 2020-07-29 – 2020-08-02 (×5): 1 via ORAL
  Filled 2020-07-29 (×5): qty 1

## 2020-07-29 MED ORDER — PROPOFOL 1000 MG/100ML IV EMUL
INTRAVENOUS | Status: AC
  Filled 2020-07-29: qty 100

## 2020-07-29 MED ORDER — MORPHINE SULFATE (PF) 2 MG/ML IV SOLN
2.0000 mg | INTRAVENOUS | Status: DC | PRN
  Administered 2020-07-29 – 2020-08-01 (×10): 2 mg via INTRAVENOUS
  Filled 2020-07-29 (×10): qty 1

## 2020-07-29 MED ORDER — TRAMADOL HCL 50 MG PO TABS: 50.0000 mg | ORAL_TABLET | Freq: Four times a day (QID) | ORAL | Status: DC | PRN

## 2020-07-29 MED ORDER — BUPIVACAINE HCL (PF) 0.5 % IJ SOLN
INTRAMUSCULAR | Status: AC
  Filled 2020-07-29: qty 30

## 2020-07-29 MED ORDER — CHLORHEXIDINE GLUCONATE 0.12 % MT SOLN: 15.0000 mL | Freq: Once | OROMUCOSAL | Status: AC

## 2020-07-29 MED ORDER — FENTANYL CITRATE (PF) 250 MCG/5ML IJ SOLN
INTRAMUSCULAR | Status: AC
  Filled 2020-07-29: qty 5

## 2020-07-29 MED ORDER — ONDANSETRON HCL 4 MG/2ML IJ SOLN
INTRAMUSCULAR | Status: DC | PRN
  Administered 2020-07-29: 4 mg via INTRAVENOUS

## 2020-07-29 MED ORDER — BUPIVACAINE LIPOSOME 1.3 % IJ SUSP
INTRAMUSCULAR | Status: DC | PRN
  Administered 2020-07-29: 100 mL

## 2020-07-29 MED ORDER — SCOPOLAMINE 1 MG/3DAYS TD PT72
1.0000 | MEDICATED_PATCH | TRANSDERMAL | Status: DC
  Administered 2020-07-29: 1.5 mg via TRANSDERMAL
  Filled 2020-07-29: qty 1

## 2020-07-29 MED ORDER — PROPOFOL 500 MG/50ML IV EMUL
INTRAVENOUS | Status: DC | PRN
  Administered 2020-07-29: 25 ug/kg/min via INTRAVENOUS

## 2020-07-29 MED ORDER — TIZANIDINE HCL 2 MG PO TABS
6.0000 mg | ORAL_TABLET | Freq: Three times a day (TID) | ORAL | Status: DC
  Administered 2020-07-30 – 2020-08-03 (×13): 6 mg via ORAL
  Filled 2020-07-29 (×13): qty 3

## 2020-07-29 MED ORDER — LIDOCAINE 2% (20 MG/ML) 5 ML SYRINGE
INTRAMUSCULAR | Status: AC
  Filled 2020-07-29: qty 5

## 2020-07-29 MED ORDER — IRON POLYSACCH CMPLX-B12-FA 150-0.025-1 MG PO CAPS: 1.0000 | ORAL_CAPSULE | Freq: Every day | ORAL | Status: DC

## 2020-07-29 MED ORDER — ALBUMIN HUMAN 5 % IV SOLN: INTRAVENOUS | Status: DC | PRN

## 2020-07-29 MED ORDER — MIDAZOLAM HCL 2 MG/2ML IJ SOLN
INTRAMUSCULAR | Status: AC
  Filled 2020-07-29: qty 2

## 2020-07-29 MED ORDER — OXYCODONE HCL 5 MG PO TABS
5.0000 mg | ORAL_TABLET | ORAL | Status: DC | PRN
  Administered 2020-07-29 – 2020-07-30 (×3): 10 mg via ORAL
  Filled 2020-07-29 (×3): qty 2

## 2020-07-29 MED ORDER — ACETAMINOPHEN 160 MG/5ML PO SOLN: 1000.0000 mg | Freq: Four times a day (QID) | ORAL | Status: DC

## 2020-07-29 MED ORDER — ATORVASTATIN CALCIUM 40 MG PO TABS
40.0000 mg | ORAL_TABLET | Freq: Every day | ORAL | Status: DC
  Administered 2020-07-30 – 2020-08-03 (×5): 40 mg via ORAL
  Filled 2020-07-29 (×5): qty 1

## 2020-07-29 MED ORDER — MIDAZOLAM HCL 5 MG/5ML IJ SOLN
INTRAMUSCULAR | Status: DC | PRN
  Administered 2020-07-29: 2 mg via INTRAVENOUS

## 2020-07-29 MED ORDER — CHLORHEXIDINE GLUCONATE 0.12 % MT SOLN
OROMUCOSAL | Status: AC
  Administered 2020-07-29: 15 mL via OROMUCOSAL
  Filled 2020-07-29: qty 15

## 2020-07-29 MED ORDER — CHLORHEXIDINE GLUCONATE CLOTH 2 % EX PADS: 6.0000 | MEDICATED_PAD | Freq: Every day | CUTANEOUS | Status: DC

## 2020-07-29 MED ORDER — LEVALBUTEROL TARTRATE 45 MCG/ACT IN AERO: 2.0000 | INHALATION_SPRAY | Freq: Four times a day (QID) | RESPIRATORY_TRACT | Status: DC | PRN

## 2020-07-29 MED ORDER — ACETAMINOPHEN 500 MG PO TABS
1000.0000 mg | ORAL_TABLET | Freq: Four times a day (QID) | ORAL | Status: DC
  Administered 2020-07-29 – 2020-08-03 (×17): 1000 mg via ORAL
  Filled 2020-07-29 (×18): qty 2

## 2020-07-29 MED ORDER — ENOXAPARIN SODIUM 40 MG/0.4ML IJ SOSY
40.0000 mg | PREFILLED_SYRINGE | Freq: Every day | INTRAMUSCULAR | Status: DC
  Administered 2020-07-30: 40 mg via SUBCUTANEOUS
  Filled 2020-07-29: qty 0.4

## 2020-07-29 MED ORDER — PROPOFOL 10 MG/ML IV BOLUS
INTRAVENOUS | Status: AC
  Filled 2020-07-29: qty 20

## 2020-07-29 MED ORDER — LORATADINE 10 MG PO TABS
10.0000 mg | ORAL_TABLET | Freq: Every day | ORAL | Status: DC
  Administered 2020-07-30 – 2020-08-03 (×5): 10 mg via ORAL
  Filled 2020-07-29 (×5): qty 1

## 2020-07-29 MED ORDER — BISACODYL 5 MG PO TBEC
10.0000 mg | DELAYED_RELEASE_TABLET | Freq: Every day | ORAL | Status: DC
  Administered 2020-07-30 – 2020-08-03 (×5): 10 mg via ORAL
  Filled 2020-07-29 (×5): qty 2

## 2020-07-29 MED ORDER — VANCOMYCIN HCL 1000 MG/200ML IV SOLN
1000.0000 mg | Freq: Two times a day (BID) | INTRAVENOUS | Status: AC
  Administered 2020-07-29: 1000 mg via INTRAVENOUS
  Filled 2020-07-29: qty 200

## 2020-07-29 MED ORDER — CHLORHEXIDINE GLUCONATE 0.12 % MT SOLN: 15.0000 mL | Freq: Once | OROMUCOSAL | Status: DC

## 2020-07-29 MED ORDER — AMISULPRIDE (ANTIEMETIC) 5 MG/2ML IV SOLN
10.0000 mg | Freq: Once | INTRAVENOUS | Status: DC | PRN
Start: 2020-07-29 — End: 2020-07-29

## 2020-07-29 MED ORDER — HEMOSTATIC AGENTS (NO CHARGE) OPTIME
TOPICAL | Status: DC | PRN
  Administered 2020-07-29 (×3): 1 via TOPICAL

## 2020-07-29 MED ORDER — PHENYLEPHRINE 40 MCG/ML (10ML) SYRINGE FOR IV PUSH (FOR BLOOD PRESSURE SUPPORT)
PREFILLED_SYRINGE | INTRAVENOUS | Status: DC | PRN
  Administered 2020-07-29 (×2): 80 ug via INTRAVENOUS

## 2020-07-29 MED ORDER — UMECLIDINIUM BROMIDE 62.5 MCG/INH IN AEPB
1.0000 | INHALATION_SPRAY | Freq: Every day | RESPIRATORY_TRACT | Status: DC
  Administered 2020-07-30 – 2020-08-03 (×4): 1 via RESPIRATORY_TRACT
  Filled 2020-07-29: qty 7

## 2020-07-29 MED ORDER — ENSURE ENLIVE PO LIQD
237.0000 mL | Freq: Two times a day (BID) | ORAL | Status: DC
  Administered 2020-07-29 – 2020-08-02 (×6): 237 mL via ORAL

## 2020-07-29 MED ORDER — EPHEDRINE SULFATE-NACL 50-0.9 MG/10ML-% IV SOSY
PREFILLED_SYRINGE | INTRAVENOUS | Status: DC | PRN
  Administered 2020-07-29: 5 mg via INTRAVENOUS
  Administered 2020-07-29: 10 mg via INTRAVENOUS

## 2020-07-29 MED ORDER — PANTOPRAZOLE SODIUM 40 MG PO TBEC
40.0000 mg | DELAYED_RELEASE_TABLET | Freq: Every day | ORAL | Status: DC
  Administered 2020-07-30 – 2020-08-03 (×5): 40 mg via ORAL
  Filled 2020-07-29 (×5): qty 1

## 2020-07-29 MED ORDER — KETOROLAC TROMETHAMINE 15 MG/ML IJ SOLN
15.0000 mg | Freq: Three times a day (TID) | INTRAMUSCULAR | Status: DC | PRN
  Administered 2020-07-29: 15 mg via INTRAVENOUS
  Filled 2020-07-29: qty 1

## 2020-07-29 MED ORDER — PHENYLEPHRINE 40 MCG/ML (10ML) SYRINGE FOR IV PUSH (FOR BLOOD PRESSURE SUPPORT)
PREFILLED_SYRINGE | INTRAVENOUS | Status: AC
  Filled 2020-07-29: qty 10

## 2020-07-29 MED ORDER — 0.9 % SODIUM CHLORIDE (POUR BTL) OPTIME
TOPICAL | Status: DC | PRN
  Administered 2020-07-29: 2000 mL

## 2020-07-29 MED ORDER — FE FUMARATE-B12-VIT C-FA-IFC PO CAPS
1.0000 | ORAL_CAPSULE | Freq: Every day | ORAL | Status: DC
  Administered 2020-07-30 – 2020-08-03 (×5): 1 via ORAL
  Filled 2020-07-29 (×5): qty 1

## 2020-07-29 MED ORDER — ONDANSETRON HCL 4 MG/2ML IJ SOLN: 4.0000 mg | Freq: Four times a day (QID) | INTRAMUSCULAR | Status: DC | PRN

## 2020-07-29 MED ORDER — ROCURONIUM BROMIDE 10 MG/ML (PF) SYRINGE
PREFILLED_SYRINGE | INTRAVENOUS | Status: AC
  Filled 2020-07-29: qty 10

## 2020-07-29 MED ORDER — APREPITANT 40 MG PO CAPS
40.0000 mg | ORAL_CAPSULE | Freq: Once | ORAL | Status: AC
  Administered 2020-07-29: 40 mg via ORAL
  Filled 2020-07-29: qty 1

## 2020-07-29 MED ORDER — IPRATROPIUM-ALBUTEROL 0.5-2.5 (3) MG/3ML IN SOLN: 3.0000 mL | Freq: Four times a day (QID) | RESPIRATORY_TRACT | Status: DC | PRN

## 2020-07-29 MED ORDER — FENTANYL CITRATE (PF) 100 MCG/2ML IJ SOLN: 25.0000 ug | INTRAMUSCULAR | Status: DC | PRN

## 2020-07-29 MED ORDER — ACETAMINOPHEN 500 MG PO TABS
1000.0000 mg | ORAL_TABLET | Freq: Once | ORAL | Status: AC
Start: 2020-07-29 — End: 2020-07-29
  Administered 2020-07-29: 1000 mg via ORAL
  Filled 2020-07-29: qty 2

## 2020-07-29 MED ORDER — NICOTINE 14 MG/24HR TD PT24
14.0000 mg | MEDICATED_PATCH | Freq: Every day | TRANSDERMAL | Status: DC
  Administered 2020-07-29 – 2020-08-03 (×6): 14 mg via TRANSDERMAL
  Filled 2020-07-29 (×6): qty 1

## 2020-07-29 MED ORDER — DEXTROSE-NACL 5-0.9 % IV SOLN: INTRAVENOUS | Status: DC

## 2020-07-29 MED ORDER — ONDANSETRON HCL 4 MG/2ML IJ SOLN
INTRAMUSCULAR | Status: AC
  Filled 2020-07-29: qty 2

## 2020-07-29 MED ORDER — EPHEDRINE 5 MG/ML INJ
INTRAVENOUS | Status: AC
  Filled 2020-07-29: qty 10

## 2020-07-29 MED ORDER — VANCOMYCIN HCL IN DEXTROSE 1-5 GM/200ML-% IV SOLN: 1000.0000 mg | INTRAVENOUS | Status: AC

## 2020-07-29 MED ORDER — ORAL CARE MOUTH RINSE: 15.0000 mL | Freq: Once | OROMUCOSAL | Status: AC

## 2020-07-29 MED ORDER — BUPIVACAINE LIPOSOME 1.3 % IJ SUSP
INTRAMUSCULAR | Status: AC
  Filled 2020-07-29: qty 20

## 2020-07-29 MED ORDER — GUAIFENESIN ER 600 MG PO TB12
600.0000 mg | ORAL_TABLET | Freq: Two times a day (BID) | ORAL | Status: DC
  Administered 2020-07-29 – 2020-08-03 (×10): 600 mg via ORAL
  Filled 2020-07-29 (×10): qty 1

## 2020-07-29 MED ORDER — DEXAMETHASONE SODIUM PHOSPHATE 10 MG/ML IJ SOLN
INTRAMUSCULAR | Status: AC
  Filled 2020-07-29: qty 1

## 2020-07-29 MED ORDER — SUGAMMADEX SODIUM 200 MG/2ML IV SOLN
INTRAVENOUS | Status: DC | PRN
  Administered 2020-07-29 (×2): 100 mg via INTRAVENOUS

## 2020-07-29 MED ORDER — TIOTROPIUM BROMIDE MONOHYDRATE 2.5 MCG/ACT IN AERS: 2.0000 | INHALATION_SPRAY | Freq: Every day | RESPIRATORY_TRACT | Status: DC

## 2020-07-29 MED ORDER — ROCURONIUM BROMIDE 10 MG/ML (PF) SYRINGE
PREFILLED_SYRINGE | INTRAVENOUS | Status: DC | PRN
  Administered 2020-07-29: 50 mg via INTRAVENOUS
  Administered 2020-07-29: 20 mg via INTRAVENOUS
  Administered 2020-07-29: 30 mg via INTRAVENOUS

## 2020-07-29 MED ORDER — MOMETASONE FURO-FORMOTEROL FUM 200-5 MCG/ACT IN AERO
2.0000 | INHALATION_SPRAY | Freq: Two times a day (BID) | RESPIRATORY_TRACT | Status: DC
  Administered 2020-07-29: 2 via RESPIRATORY_TRACT
  Filled 2020-07-29: qty 8.8

## 2020-07-29 MED ORDER — FENTANYL CITRATE (PF) 250 MCG/5ML IJ SOLN
INTRAMUSCULAR | Status: DC | PRN
  Administered 2020-07-29: 50 ug via INTRAVENOUS
  Administered 2020-07-29 (×2): 100 ug via INTRAVENOUS

## 2020-07-29 MED ORDER — VANCOMYCIN HCL IN DEXTROSE 1-5 GM/200ML-% IV SOLN
INTRAVENOUS | Status: AC
  Administered 2020-07-29: 1000 mg via INTRAVENOUS
  Filled 2020-07-29: qty 200

## 2020-07-29 MED ORDER — TRAZODONE HCL 50 MG PO TABS
150.0000 mg | ORAL_TABLET | Freq: Every day | ORAL | Status: DC
  Administered 2020-07-30 – 2020-08-02 (×4): 300 mg via ORAL
  Filled 2020-07-29 (×4): qty 6

## 2020-07-29 MED ORDER — PROPOFOL 10 MG/ML IV BOLUS
INTRAVENOUS | Status: DC | PRN
  Administered 2020-07-29: 150 mg via INTRAVENOUS

## 2020-07-29 MED ORDER — DEXAMETHASONE SODIUM PHOSPHATE 10 MG/ML IJ SOLN
INTRAMUSCULAR | Status: DC | PRN
  Administered 2020-07-29: 10 mg via INTRAVENOUS

## 2020-07-29 MED ORDER — DULOXETINE HCL 60 MG PO CPEP
90.0000 mg | ORAL_CAPSULE | Freq: Every day | ORAL | Status: DC
  Administered 2020-07-30 – 2020-08-03 (×5): 90 mg via ORAL
  Filled 2020-07-29 (×5): qty 1

## 2020-07-29 MED ORDER — AMLODIPINE BESYLATE 10 MG PO TABS
10.0000 mg | ORAL_TABLET | Freq: Every day | ORAL | Status: DC
  Administered 2020-07-30 – 2020-08-03 (×5): 10 mg via ORAL
  Filled 2020-07-29 (×5): qty 1

## 2020-07-29 MED ORDER — SODIUM CHLORIDE 0.9 % IV SOLN
INTRAVENOUS | Status: AC | PRN
  Administered 2020-07-29: 1000 mL

## 2020-07-29 MED ORDER — PHENYLEPHRINE HCL-NACL 10-0.9 MG/250ML-% IV SOLN
INTRAVENOUS | Status: DC | PRN
  Administered 2020-07-29: 15 ug/min via INTRAVENOUS

## 2020-07-29 SURGICAL SUPPLY — 115 items
APPLIER CLIP ROT 10 11.4 M/L (STAPLE)
BLADE CLIPPER SURG (BLADE) ×2 IMPLANT
BNDG COHESIVE 6X5 TAN STRL LF (GAUZE/BANDAGES/DRESSINGS) IMPLANT
CANISTER SUCT 3000ML PPV (MISCELLANEOUS) ×2 IMPLANT
CANNULA REDUC XI 12-8 STAPL (CANNULA) ×4
CANNULA REDUCER 12-8 DVNC XI (CANNULA) ×2 IMPLANT
CATH THORACIC 28FR (CATHETERS) IMPLANT
CATH THORACIC 28FR RT ANG (CATHETERS) IMPLANT
CATH THORACIC 36FR (CATHETERS) IMPLANT
CATH THORACIC 36FR RT ANG (CATHETERS) IMPLANT
CLIP APPLIE ROT 10 11.4 M/L (STAPLE) IMPLANT
CLIP VESOCCLUDE MED 6/CT (CLIP) IMPLANT
CNTNR URN SCR LID CUP LEK RST (MISCELLANEOUS) ×19 IMPLANT
CONN ST 1/4X3/8  BEN (MISCELLANEOUS)
CONN ST 1/4X3/8 BEN (MISCELLANEOUS) IMPLANT
CONT SPEC 4OZ STRL OR WHT (MISCELLANEOUS) ×38
DEFOGGER SCOPE WARMER CLEARIFY (MISCELLANEOUS) ×2 IMPLANT
DERMABOND ADVANCED (GAUZE/BANDAGES/DRESSINGS) ×1
DERMABOND ADVANCED .7 DNX12 (GAUZE/BANDAGES/DRESSINGS) ×1 IMPLANT
DRAIN CHANNEL 28F RND 3/8 FF (WOUND CARE) IMPLANT
DRAIN CHANNEL 32F RND 10.7 FF (WOUND CARE) IMPLANT
DRAPE ARM DVNC X/XI (DISPOSABLE) ×4 IMPLANT
DRAPE COLUMN DVNC XI (DISPOSABLE) ×1 IMPLANT
DRAPE CV SPLIT W-CLR ANES SCRN (DRAPES) ×2 IMPLANT
DRAPE DA VINCI XI ARM (DISPOSABLE) ×8
DRAPE DA VINCI XI COLUMN (DISPOSABLE) ×2
DRAPE INCISE IOBAN 66X45 STRL (DRAPES) IMPLANT
DRAPE ORTHO SPLIT 77X108 STRL (DRAPES) ×2
DRAPE SURG ORHT 6 SPLT 77X108 (DRAPES) ×1 IMPLANT
ELECT BLADE 6.5 EXT (BLADE) IMPLANT
ELECT REM PT RETURN 9FT ADLT (ELECTROSURGICAL) ×2
ELECTRODE REM PT RTRN 9FT ADLT (ELECTROSURGICAL) ×1 IMPLANT
GAUZE KITTNER 4X5 RF (MISCELLANEOUS) ×8 IMPLANT
GAUZE SPONGE 4X4 12PLY STRL (GAUZE/BANDAGES/DRESSINGS) ×2 IMPLANT
GAUZE SPONGE 4X4 12PLY STRL LF (GAUZE/BANDAGES/DRESSINGS) ×2 IMPLANT
GLOVE TRIUMPH SURG SIZE 7.5 (KITS) ×4 IMPLANT
GOWN STRL REUS W/ TWL LRG LVL3 (GOWN DISPOSABLE) ×2 IMPLANT
GOWN STRL REUS W/ TWL XL LVL3 (GOWN DISPOSABLE) ×3 IMPLANT
GOWN STRL REUS W/TWL 2XL LVL3 (GOWN DISPOSABLE) ×4 IMPLANT
GOWN STRL REUS W/TWL LRG LVL3 (GOWN DISPOSABLE) ×4
GOWN STRL REUS W/TWL XL LVL3 (GOWN DISPOSABLE) ×6
HEMOSTAT SURGICEL 2X14 (HEMOSTASIS) ×8 IMPLANT
IRRIGATION STRYKERFLOW (MISCELLANEOUS) ×1 IMPLANT
IRRIGATOR STRYKERFLOW (MISCELLANEOUS) ×2
KIT BASIN OR (CUSTOM PROCEDURE TRAY) ×2 IMPLANT
KIT SUCTION CATH 14FR (SUCTIONS) IMPLANT
KIT TURNOVER KIT B (KITS) ×2 IMPLANT
LOOP VESSEL SUPERMAXI WHITE (MISCELLANEOUS) IMPLANT
NEEDLE HYPO 25GX1X1/2 BEV (NEEDLE) ×2 IMPLANT
NEEDLE SPNL 22GX3.5 QUINCKE BK (NEEDLE) ×2 IMPLANT
NS IRRIG 1000ML POUR BTL (IV SOLUTION) ×2 IMPLANT
OBTURATOR OPTICAL STANDARD 8MM (TROCAR)
OBTURATOR OPTICAL STND 8 DVNC (TROCAR)
OBTURATOR OPTICALSTD 8 DVNC (TROCAR) IMPLANT
PACK CHEST (CUSTOM PROCEDURE TRAY) ×2 IMPLANT
PAD ARMBOARD 7.5X6 YLW CONV (MISCELLANEOUS) ×4 IMPLANT
PORT ACCESS TROCAR AIRSEAL 12 (TROCAR) ×1 IMPLANT
PORT ACCESS TROCAR AIRSEAL 5M (TROCAR) ×1
RELOAD STAPLER 2.5X45 WHT DVNC (STAPLE) ×2 IMPLANT
RELOAD STAPLER 3.5X45 BLU DVNC (STAPLE) ×4 IMPLANT
RELOAD STAPLER 4.3X45 GRN DVNC (STAPLE) ×7 IMPLANT
RELOAD STAPLER 45 4.6 BLK DVNC (STAPLE) ×1 IMPLANT
SCISSORS LAP 5X35 DISP (ENDOMECHANICALS) IMPLANT
SEAL CANN UNIV 5-8 DVNC XI (MISCELLANEOUS) ×2 IMPLANT
SEAL XI 5MM-8MM UNIVERSAL (MISCELLANEOUS) ×4
SEALANT PROGEL (MISCELLANEOUS) IMPLANT
SEALANT SURG COSEAL 4ML (VASCULAR PRODUCTS) IMPLANT
SEALANT SURG COSEAL 8ML (VASCULAR PRODUCTS) IMPLANT
SET TRI-LUMEN FLTR TB AIRSEAL (TUBING) ×2 IMPLANT
SET TUBE SMOKE EVAC HIGH FLOW (TUBING) ×2 IMPLANT
SOLUTION ELECTROLUBE (MISCELLANEOUS) IMPLANT
SPECIMEN JAR MEDIUM (MISCELLANEOUS) IMPLANT
SPONGE INTESTINAL PEANUT (DISPOSABLE) IMPLANT
SPONGE TONSIL TAPE 1 RFD (DISPOSABLE) IMPLANT
STAPLER 45 SUREFORM CVD (STAPLE) ×4
STAPLER 45 SUREFORM CVD DVNC (STAPLE) ×2 IMPLANT
STAPLER CANNULA SEAL DVNC XI (STAPLE) ×2 IMPLANT
STAPLER CANNULA SEAL XI (STAPLE) ×4
STAPLER RELOAD 2.5X45 WHITE (STAPLE) ×4
STAPLER RELOAD 2.5X45 WHT DVNC (STAPLE) ×2
STAPLER RELOAD 3.5X45 BLU DVNC (STAPLE) ×4
STAPLER RELOAD 3.5X45 BLUE (STAPLE) ×8
STAPLER RELOAD 4.3X45 GREEN (STAPLE) ×14
STAPLER RELOAD 4.3X45 GRN DVNC (STAPLE) ×7
STAPLER RELOAD 45 4.6 BLK (STAPLE) ×2
STAPLER RELOAD 45 4.6 BLK DVNC (STAPLE) ×1
SUT PDS AB 3-0 SH 27 (SUTURE) IMPLANT
SUT PROLENE 4 0 RB 1 (SUTURE)
SUT PROLENE 4-0 RB1 .5 CRCL 36 (SUTURE) IMPLANT
SUT SILK  1 MH (SUTURE) ×4
SUT SILK 1 MH (SUTURE) ×2 IMPLANT
SUT SILK 1 TIES 10X30 (SUTURE) IMPLANT
SUT SILK 2 0 SH (SUTURE) IMPLANT
SUT SILK 2 0SH CR/8 30 (SUTURE) ×2 IMPLANT
SUT SILK 3 0SH CR/8 30 (SUTURE) IMPLANT
SUT VIC AB 1 CTX 36 (SUTURE) ×2
SUT VIC AB 1 CTX36XBRD ANBCTR (SUTURE) ×1 IMPLANT
SUT VIC AB 2-0 CTX 36 (SUTURE) ×2 IMPLANT
SUT VIC AB 3-0 MH 27 (SUTURE) IMPLANT
SUT VIC AB 3-0 X1 27 (SUTURE) ×2 IMPLANT
SUT VICRYL 0 TIES 12 18 (SUTURE) ×2 IMPLANT
SUT VICRYL 0 UR6 27IN ABS (SUTURE) ×4 IMPLANT
SUT VICRYL 2 TP 1 (SUTURE) IMPLANT
SYR 20ML LL LF (SYRINGE) ×4 IMPLANT
SYSTEM RETRIEVAL ANCHOR 12 (MISCELLANEOUS) ×2 IMPLANT
SYSTEM SAHARA CHEST DRAIN ATS (WOUND CARE) ×2 IMPLANT
TAPE CLOTH 4X10 WHT NS (GAUZE/BANDAGES/DRESSINGS) ×2 IMPLANT
TAPE PAPER 3X10 WHT MICROPORE (GAUZE/BANDAGES/DRESSINGS) ×2 IMPLANT
TIP APPLICATOR SPRAY EXTEND 16 (VASCULAR PRODUCTS) IMPLANT
TOWEL GREEN STERILE (TOWEL DISPOSABLE) ×4 IMPLANT
TRAY FOLEY MTR SLVR 16FR STAT (SET/KITS/TRAYS/PACK) ×2 IMPLANT
TROCAR BLADELESS 15MM (ENDOMECHANICALS) IMPLANT
TROCAR XCEL 12X100 BLDLESS (ENDOMECHANICALS) ×2 IMPLANT
TROCAR XCEL BLADELESS 5X75MML (TROCAR) IMPLANT
WATER STERILE IRR 1000ML POUR (IV SOLUTION) ×2 IMPLANT

## 2020-07-29 NOTE — Anesthesia Procedure Notes (Signed)
Procedure Name: Intubation Date/Time: 07/29/2020 8:11 AM Performed by: Trinna Post., CRNA Pre-anesthesia Checklist: Patient identified, Emergency Drugs available, Suction available, Patient being monitored and Timeout performed Patient Re-evaluated:Patient Re-evaluated prior to induction Oxygen Delivery Method: Circle system utilized Preoxygenation: Pre-oxygenation with 100% oxygen Induction Type: IV induction Ventilation: Mask ventilation without difficulty Laryngoscope Size: Mac and 3 Grade View: Grade II Endobronchial tube: Left, Double lumen EBT and EBT position confirmed by auscultation and 37 Fr Number of attempts: 1 Airway Equipment and Method: Rigid stylet Placement Confirmation: ETT inserted through vocal cords under direct vision,  positive ETCO2 and breath sounds checked- equal and bilateral Tube secured with: Tape Dental Injury: Teeth and Oropharynx as per pre-operative assessment  Difficulty Due To: Difficult Airway- due to anterior larynx

## 2020-07-29 NOTE — Interval H&P Note (Signed)
History and Physical Interval Note:  07/29/2020 7:49 AM  Jasmine [name]  has presented today for surgery, with the diagnosis of RUL NODULE.  The various methods of treatment have been discussed with the patient and family. After consideration of risks, benefits and other options for treatment, the patient has consented to  Procedure(s): XI ROBOTIC ASSISTED THORASCOPY-WEDGE RESECTION, possible upper lobectomy (Right) as a surgical intervention.  The patient's history has been reviewed, patient examined, no change in status, stable for surgery.  I have reviewed the patient's chart and labs.  Questions were answered to the patient's satisfaction.     Melrose Nakayama

## 2020-07-29 NOTE — Anesthesia Procedure Notes (Signed)
Arterial Line Insertion Start/End5/26/2022 7:30 AM Performed by: Suzette Battiest, MD, anesthesiologist  Patient location: Pre-op. Preanesthetic checklist: patient identified, IV checked, site marked, risks and benefits discussed, surgical consent, monitors and equipment checked, pre-op evaluation, timeout performed and anesthesia consent Lidocaine 1% used for infiltration Left, radial was placed Catheter size: 20 G Hand hygiene performed  and maximum sterile barriers used   Attempts: 1 Procedure performed using ultrasound guided technique. Ultrasound Notes:anatomy identified, needle tip was noted to be adjacent to the nerve/plexus identified and no ultrasound evidence of intravascular and/or intraneural injection Following insertion, dressing applied and Biopatch. Post procedure assessment: normal  Patient tolerated the procedure well with no immediate complications.

## 2020-07-29 NOTE — Brief Op Note (Addendum)
07/29/2020  3:48 PM  PATIENT:  Jasmine [name]  65 y.o. 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)  PROCEDURE:  Procedure(s): XI ROBOTIC ASSISTED THORASCOPY-WEDGE RESECTION, WITH RIGHT UPPER LOBECTOMY (Right) INTERCOSTAL NERVE BLOCK (Right) LYMPH NODE BIOPSY (Right)  SURGEON:  Surgeon(s) and Role:    * Melrose Nakayama, MD - Primary  PHYSICIAN ASSISTANT: WAYNE GOLD PA-C  ASSISTANTS: none   ANESTHESIA:   general  EBL:  30 mL   BLOOD ADMINISTERED:none  DRAINS: (1 21 F ) Blake drain(s) in the RIGHT HEMITHORAX   LOCAL MEDICATIONS USED:  EXPAREL  SPECIMEN:  Source of Specimen:  RUL AND MULTIPLE LN SAMPLES  DISPOSITION OF SPECIMEN:  PATHOLOGY  COUNTS:  YES  TOURNIQUET:  * No tourniquets in log *  DICTATION: .Other Dictation: Dictation Number [id number]  PLAN OF CARE: Admit to inpatient   PATIENT DISPOSITION:  PACU - hemodynamically stable.   Delay start of Pharmacological VTE agent (>24hrs) due to surgical blood loss or risk of bleeding: no  COMPLICATIONS: NO KNOWN  Wedge resection performed with rim of middle lobe en bloc as unclear if fissure involved.

## 2020-07-29 NOTE — Transfer of Care (Signed)
Immediate Anesthesia Transfer of Care Note  Patient: Jasmine [name]  Procedure(s) Performed: XI ROBOTIC ASSISTED THORASCOPY-WEDGE RESECTION, WITH RIGHT UPPER LOBECTOMY (Right Chest) INTERCOSTAL NERVE BLOCK (Right Chest) LYMPH NODE BIOPSY (Right Chest)  Patient Location: PACU  Anesthesia Type:General  Level of Consciousness: drowsy  Airway & Oxygen Therapy: Patient Spontanous Breathing and Patient connected to nasal cannula oxygen  Post-op Assessment: Report given to RN and Post -op Vital signs reviewed and stable  Post vital signs: Reviewed and stable  Last Vitals:  Vitals Value Taken Time  BP 135/74 07/29/20 1154  Temp    Pulse 95 07/29/20 1157  Resp 20 07/29/20 1157  SpO2 99 % 07/29/20 1157  Vitals shown include unvalidated device data.  Last Pain:  Vitals:   07/29/20 0605  TempSrc:   PainSc: 6          Complications: No complications documented.

## 2020-07-29 NOTE — Op Note (Signed)
NAME: Jasmine [name], Jasmine [name] MEDICAL RECORD NO: [id number] ACCOUNT NO: [id number] DATE OF BIRTH: 10/12/1955 FACILITY: [facility] LOCATION: [facility] PHYSICIAN: Revonda Standard. Roxan Hockey, MD  Operative Report   DATE OF PROCEDURE: 07/29/2020  PREOPERATIVE DIAGNOSIS:  Right upper lobe lung nodule.  POSTOPERATIVE DIAGNOSIS:  Non-small cell carcinoma, right upper lobe, clinical stage IA (T1, N0).  PROCEDURE PERFORMED:   Xi robotic-assisted thoracoscopy, Wedge resection right upper lobe nodule with en bloc portion of middle lobe, Right upper lobectomy, Lymph node dissection, and Intercostal nerve blocks levels 3 through 10.  SURGEON:  Revonda Standard. Roxan Hockey, MD  ASSISTANT:  Jadene Pierini, PA-C.  ANESTHESIA:  General.  FINDINGS:  Mass in the inferior aspect of upper lobe adherent to the middle lobe, small portion of the middle lobe taken en bloc with the wedge resection to avoid possibly violating tumor planes.  Frozen section revealed non-small cell carcinoma.  Bronchial  margin was negative for tumor.  CLINICAL NOTE:  Jasmine [name] is a 65 year old woman with a history of tobacco abuse and COPD.  She was being evaluated for dyspnea and a CT showed a 1.5 x 1.5 x 1.7 cm spiculated right upper lobe nodule adjacent to and retracting the minor fissure.  On  PET CT the nodule was hypermetabolic with an SUV of 5.5.  She was offered the option of surgery versus a biopsy followed by stereotactic radiation.  She was adamant about having surgical resection despite the high risk nature of the procedure.  OPERATIVE NOTE:  Jasmine [name] was brought to the preoperative holding area on 07/29/2020.  Anesthesia placed a central venous catheter and arterial blood pressure monitoring line.  She was taken to the operating room and anesthetized and intubated with a  double lumen endotracheal tube.  Intravenous antibiotics were administered.  Foley catheter was placed.  Sequential compression devices were placed on the calves for DVT  prophylaxis.  She was placed in a left lateral decubitus position and the right  chest was prepped and draped in the usual sterile fashion.  A Bair Hugger was in place for active warming.  Single lung ventilation of the left lung was initiated and was tolerated well throughout the procedure.  A timeout was performed.  A solution containing 20 mL of liposomal bupivacaine 30 mL of 0.5% bupivacaine and 50 mL of saline was prepared.  This was used for local at the incision sites as well as the intercostal nerve blocks.  An incision was made in  the eighth interspace in the mid axillary line, an 8 mm port was inserted.  The thoracoscope was advanced into the chest.  After confirming intrapleural placement carbon dioxide was insufflated per protocol.  There was good isolation of the right  lung.  There were some adhesions of the middle and upper lobe to the chest wall anterolaterally.  A 12 mm port was placed in the eighth interspace, 5 cm anterior to the camera port.  Intercostal nerve blocks then were performed from the 3rd to the 10th  interspace.  The needle was inserted from the posterior approach and 10 mL of the bupivacaine solution was injected into a subpleural plane at each level.  Two additional eighth interspace ports were placed for the robotic ports and a 12 mm AirSeal port  was placed in the tenth interspace posterolaterally for the assistant.  The robot was brought to the table and deployed, the camera arm was docked.  Targeting was performed.  The remaining arms were docked.  The instruments were inserted with  thoracoscopic visualization.  The adhesions were taken down.  These were thin filmy adhesions.  Inspection of the major fissure revealed it was very complete in the central portion of the confluence of the fissures, the minor fissure was incomplete.  The mass was visible in the  anterior aspect of the upper lobe and the anterolateral aspect abutting the fissure, some thin filmy adhesions  were taken down in this area, but as the tumor was approached, there was not a clear plane with the fissure.  Decision was made to perform the  wedge resection, taking a rim of the middle lobe with the specimen to avoid violating the tumor planes.  This was performed with sequential firings of the robotic stapler.  The specimen was placed into an endoscopic retrieval bag and removed and sent for  frozen section.  While awaiting the results of the frozen section the lymph node dissection was begun.  The inferior ligament was divided.  All lymph nodes that were encountered were removed and sent for permanent pathology.  All appeared grossly  benign, although some were enlarged.  Pleural reflection was divided at the hilum posteriorly and level 8 and 7 nodes were removed superiorly.  The pleura superior to the azygos vein was incised and multiple enlarged 4R nodes were removed and then a 2R  node was removed as well.  By this point, the frozen section returned showing non-small cell carcinoma.  The dissection was begun over the basilar segmental trunk of the pulmonary artery.  The pleura was divided with the bipolar cautery.  This dissection  was carried posteriorally.  The superior segmental branch was identified.  A node was removed there.  An level 11 node had been removed previously and the major fissure was completed posteriorly with sequential firings of the robotic stapler.  The lung then  was retracted posteriorly.  The superior pulmonary vein was identified.  The middle lobe branch was identified and preserved.  The upper lobe branches were dissected out, encircled and divided with the robotic stapler using a vascular cartridge.   Additional nodes were dissected out at the takeoff of the upper lobe arterial branches, which arose as a common trunk from the main pulmonary artery.  This trunk was encircled and then divided with the robotic stapler.  Additional nodes were dissected  out around the  bronchus.  The minor fissure then was completed using the robotic stapler again with the stapler biased to the middle lobe to ensure that the entire upper lobe was removed.  At this point, there was a node removed from the bronchus and  then the robotic stapler was placed across the right upper lobe bronchus at its origin, flush with the bronchus intermedius. The stapler was closed.  A test inflation revealed good aeration of the lower and middle lobes.  The stapler was fired transecting the  right upper lobe bronchus.  Stapler then was used to tack the middle lobe to the lower lobe to prevent torsion.  The majority of the sponges and the vessel loop were removed.  The upper lobe was placed into a 12 mm endoscopic retrieval bag, which was  then closed and brought down to the inferior portion of the chest.  The robotic instruments were removed and the robot was undocked.  The anterior eighth interspace incision was lengthened to 3 cm and the right upper lobe was removed through that  incision and sent for frozen section and the bronchial margin was subsequently returned with no tumor  seen.  The remaining sponges were removed.  The chest was copiously irrigated with warm saline.  A test inflation to 30 cm water revealed no leakage  from the bronchial stump.  There was good expansion of the lower and middle lobes.  A 28-French Blake drain was placed through the original port incision and secured with a #1 silk suture.  The frozen section returned showing the bronchial margin was  clear.  The remaining incisions were closed in standard fashion.  Dermabond was applied.  The chest tube was placed to a Pleur-evac on waterseal.  The patient was placed back in the supine position.  She was extubated in the operating room and taken to  the [facility] in good condition.  All sponge, needle and instrument counts were correct at the end of the procedure.   PUS D: 07/29/2020 5:47:41 pm T: 07/29/2020  7:30:00 pm  JOB: [id number]/ [id number]

## 2020-07-29 NOTE — Anesthesia Postprocedure Evaluation (Signed)
Anesthesia Post Note  Patient: Jasmine [name]  Procedure(s) Performed: XI ROBOTIC ASSISTED THORASCOPY-WEDGE RESECTION, WITH RIGHT UPPER LOBECTOMY (Right Chest) INTERCOSTAL NERVE BLOCK (Right Chest) LYMPH NODE BIOPSY (Right Chest)     Patient location during evaluation: PACU Anesthesia Type: General Level of consciousness: awake and alert Pain management: pain level controlled Vital Signs Assessment: post-procedure vital signs reviewed and stable Respiratory status: spontaneous breathing, nonlabored ventilation, respiratory function stable and patient connected to nasal cannula oxygen Cardiovascular status: blood pressure returned to baseline and stable Postop Assessment: no apparent nausea or vomiting Anesthetic complications: no   No complications documented.  Last Vitals:  Vitals:   07/29/20 1340 07/29/20 1405  BP: 136/82 138/84  Pulse: (!) 101 100  Resp: (!) 23 (!) 21  Temp: 37.2 C 37.2 C  SpO2: 95% 96%    Last Pain:  Vitals:   07/29/20 1405  TempSrc: Oral  PainSc: 8                  Tiajuana Amass

## 2020-07-30 ENCOUNTER — Encounter (HOSPITAL_COMMUNITY): Payer: Self-pay | Admitting: Thoracic Surgery (Cardiothoracic Vascular Surgery)

## 2020-07-30 ENCOUNTER — Inpatient Hospital Stay (HOSPITAL_COMMUNITY): Payer: Medicare Other

## 2020-07-30 LAB — CBC
HCT: 33.9 % — ABNORMAL LOW (ref 36.0–46.0)
Hemoglobin: 10.9 g/dL — ABNORMAL LOW (ref 12.0–15.0)
MCH: 33.7 pg (ref 26.0–34.0)
MCHC: 32.2 g/dL (ref 30.0–36.0)
MCV: 105 fL — ABNORMAL HIGH (ref 80.0–100.0)
Platelets: 184 10*3/uL (ref 150–400)
RBC: 3.23 MIL/uL — ABNORMAL LOW (ref 3.87–5.11)
RDW: 15.4 % (ref 11.5–15.5)
WBC: 8.8 10*3/uL (ref 4.0–10.5)
nRBC: 0.2 % (ref 0.0–0.2)

## 2020-07-30 LAB — BASIC METABOLIC PANEL
Anion gap: 10 (ref 5–15)
BUN: 9 mg/dL (ref 8–23)
CO2: 27 mmol/L (ref 22–32)
Calcium: 9.6 mg/dL (ref 8.9–10.3)
Chloride: 98 mmol/L (ref 98–111)
Creatinine, Ser: 0.52 mg/dL (ref 0.44–1.00)
GFR, Estimated: >60 mL/min (ref >60)
Glucose, Bld: 123 mg/dL — ABNORMAL HIGH (ref 70–99)
Potassium: 4.3 mmol/L (ref 3.5–5.1)
Sodium: 135 mmol/L (ref 135–145)

## 2020-07-30 MED ORDER — MOMETASONE FURO-FORMOTEROL FUM 200-5 MCG/ACT IN AERO
2.0000 | INHALATION_SPRAY | Freq: Two times a day (BID) | RESPIRATORY_TRACT | Status: DC
  Administered 2020-07-30 – 2020-08-03 (×8): 2 via RESPIRATORY_TRACT
  Filled 2020-07-30: qty 8.8

## 2020-07-30 MED ORDER — OXYCODONE HCL 5 MG PO TABS
10.0000 mg | ORAL_TABLET | ORAL | Status: DC | PRN
  Administered 2020-07-30 – 2020-08-02 (×5): 10 mg via ORAL
  Filled 2020-07-30 (×5): qty 2

## 2020-07-30 MED ORDER — MAGNESIUM OXIDE -MG SUPPLEMENT 400 (240 MG) MG PO TABS
400.0000 mg | ORAL_TABLET | Freq: Two times a day (BID) | ORAL | Status: DC
  Administered 2020-07-30 – 2020-08-03 (×9): 400 mg via ORAL
  Filled 2020-07-30 (×9): qty 1

## 2020-07-30 MED ORDER — HYDROCODONE BITARTRATE ER 60 MG PO T24A: 60.0000 mg | EXTENDED_RELEASE_TABLET | Freq: Every day | ORAL | Status: DC

## 2020-07-30 MED ORDER — OXYCODONE HCL ER 15 MG PO T12A
60.0000 mg | EXTENDED_RELEASE_TABLET | Freq: Two times a day (BID) | ORAL | Status: DC
  Administered 2020-07-30 – 2020-08-03 (×9): 60 mg via ORAL
  Filled 2020-07-30 (×9): qty 4

## 2020-07-30 NOTE — Progress Notes (Signed)
Call from pharmacy to have pt spouse bring in her long acting hydrocodone from home.  Patient states she knows that medication will not help with her pain so she is not going to have her spouse bring it in.  Patient wants to stay with the oxycodone and morphine

## 2020-07-30 NOTE — Plan of Care (Signed)

## 2020-07-30 NOTE — Discharge Instructions (Signed)
Robot-Assisted Thoracic Surgery, Care After The following information offers guidance on how to care for yourself after your procedure. Your health care provider may also give you more specific instructions. If you have problems or questions, contact your health care provider. What can I expect after the procedure? After the procedure, it is common to have:  Some pain and aches in the area of your surgical incisions.  Pain when breathing in (inhaling) and coughing.  Tiredness (fatigue).  Trouble sleeping.  Constipation. Follow these instructions at home: Medicines  Take over-the-counter and prescription medicines only as told by your health care provider.  If you were prescribed an antibiotic medicine, take it as told by your health care provider. Do not stop taking the antibiotic even if you start to feel better.  Talk with your health care provider about safe and effective ways to manage pain after your procedure. Pain management should fit your specific health needs.  Take pain medicine before pain becomes severe. Relieving and controlling your pain will make breathing easier for you.  Ask your health care provider if the medicine prescribed to you requires you to avoid driving or using machinery. Eating and drinking Follow instructions from your health care provider about eating or drinking restrictions. These will vary depending on what procedure you had. Your health care provider may recommend:  A liquid diet or soft diet for the first few days.  Meals that are smaller and more frequent.  A diet of fruits, vegetables, whole grains, and low-fat proteins.  Limiting foods that are high in fat and processed sugar, including fried or sweet foods. Incision care  Follow instructions from your health care provider about how to take care of your incisions. Make sure you: ? Wash your hands with soap and water for at least 20 seconds before and after you change your bandage  (dressing). If soap and water are not available, use hand sanitizer. ? Change your dressing as told by your health care provider. ? Leave stitches (sutures), skin glue, or adhesive strips in place. These skin closures may need to stay in place for 2 weeks or longer. If adhesive strip edges start to loosen and curl up, you may trim the loose edges. Do not remove adhesive strips completely unless your health care provider tells you to do that.  Check your incision area every day for signs of infection. Check for: ? Redness, swelling, or more pain. ? Fluid or blood. ? Warmth. ? Pus or a bad smell. Activity  Return to your normal activities as told by your health care provider. Ask your health care provider what activities are safe for you.  Ask your health care provider when it is safe for you to drive.  Do not lift anything that is heavier than 10 lb (4.5 kg), or the limit that you are told, until your health care provider says that it is safe.  Rest as told by your health care provider.  Avoid sitting for a long time without moving. Get up to take short walks every 1-2 hours. This is important to improve blood flow and breathing. Ask for help if you feel weak or unsteady.  Do exercises as told by your health care provider. Pneumonia prevention  Do deep breathing exercises and cough regularly as directed. This helps clear mucus and opens your lungs. Doing this helps prevent lung infection (pneumonia).  If you were given an incentive spirometer, use it as told. An incentive spirometer is a tool that measures how  well you are filling your lungs with each breath.  Coughing may hurt less if you try to support your chest. This is called splinting. Try one of these when you cough: ? Hold a pillow against your chest. ? Place the palms of both hands on top of your incision area.  Do not use any products that contain nicotine or tobacco. These products include cigarettes, chewing tobacco, and  vaping devices, such as e-cigarettes. If you need help quitting, ask your health care provider.  Avoid secondhand smoke.   General instructions  If you have a drainage tube: ? Follow instructions from your health care provider about how to take care of it. ? Do not travel by airplane after your tube is removed until your health care provider tells you it is safe.  You may need to take these actions to prevent or treat constipation: ? Drink enough fluid to keep your urine pale yellow. ? Take over-the-counter or prescription medicines. ? Eat foods that are high in fiber, such as beans, whole grains, and fresh fruits and vegetables. ? Limit foods that are high in fat and processed sugars, such as fried or sweet foods.  Keep all follow-up visits. This is important. Contact a health care provider if:  You have redness, swelling, or more pain around an incision.  You have fluid or blood coming from an incision.  An incision feels warm to the touch.  You have pus or a bad smell coming from an incision.  You have a fever.  You cannot eat or drink without vomiting.  Your pain medicine is not controlling your pain. Get help right away if:  You have chest pain.  Your heart is beating quickly.  You have trouble breathing.  You have trouble speaking.  You are confused.  You feel weak or dizzy, or you faint. These symptoms may represent a serious problem that is an emergency. Do not wait to see if the symptoms will go away. Get medical help right away. Call your local emergency services (911 in the [place] Do not drive yourself to the hospital. Summary  Talk with your health care provider about safe and effective ways to manage pain after your procedure. Pain management should fit your specific health needs.  Return to your normal activities as told by your health care provider. Ask your health care provider what activities are safe for you.  Do deep breathing exercises and cough  regularly as directed. This helps to clear mucus and prevent pneumonia. If it hurts to cough, ease pain by holding a pillow against your chest or by placing the palms of both hands over your incisions. This information is not intended to replace advice given to you by your health care provider. Make sure you discuss any questions you have with your health care provider. Document Revised: 11/14/2019 Document Reviewed: 11/14/2019 Elsevier Patient Education  2021 [employer].

## 2020-07-30 NOTE — Discharge Summary (Addendum)
Physician Discharge Summary       [address]       [place] [address]             [phone]    Patient ID: Jasmine [name] MRN: [id number] DOB/AGE: 07-02-1955 65 y.o.  Admit date: 07/29/2020 Discharge date: 08/03/2020  Admission Diagnoses: Right upper lobe lung nodule Discharge Diagnoses:  1. Non small cell carcinoma RUL 2. S/p Xi robotic-assisted thoracoscopy, wedge resection, right upper lobe nodule with en bloc portion of middle lobe, right upper lobectomy, lymph node dissection, and intercostal nerve blocks at levels 3 through 10  3. History of the following: . Allergy   . Anemia   . Anxiety   . Arthritis   . Asthma   . Carpal tunnel syndrome   . Chronic back pain   . Cigarette smoker 08/25/2017  . Complication of anesthesia    in the past has had N/V, the last few surgeries she has not been  . COPD (chronic obstructive pulmonary disease) ([place])   . Easy bruising 07/10/2016  . GERD (gastroesophageal reflux disease)   . Headache   . Heart murmur 2005   never had any problems  . History of blood transfusion 2018  . History of kidney stones   . Hypertension   . Hypoglycemia   . Hypoglycemia   . Iron deficiency anemia 07/11/2016  . Neuropathy    both arms  . Normocytic anemia 07/29/2015  . Pancreatitis   . Pneumonia   . PONV (postoperative nausea and vomiting)   . Postoperative anemia due to acute blood loss 11/15/2015  . R lung cancer   . Sciatica   . Seizures ([place])    as a child when she would have an asthma attack. none as an adult   Consults: None  Procedure (s):  Xi robotic-assisted thoracoscopy, wedge resection, right upper lobe nodule with en bloc portion of middle lobe, right upper lobectomy, lymph node dissection, and intercostal nerve blocks at levels 3 through 10 by Dr. Roxan Hockey on 07/29/2020.  Pathology: pending  History of Presenting Illness: Jasmine [name] is a 65 year old woman with a history of  asthma, COPD, tobacco abuse, reflux, hypertension, hyperglycemia, anemia, anxiety, chronic pain, sciatica, neuropathy, right foot drop, pancreatitis, pneumonia, and seizures as a child.  She was being evaluated for dyspnea.  A CT showed a 1.5 x 1.5 x 1.7 cm spiculated right upper lobe lung nodule.  The nodule was adjacent to and retracting the minor fissure.  There was a small subpleural nodule in the middle lobe along the fissure.  She had a PET/CT which showed the nodule was hypermetabolic with SUV of 5.5.  There was concern for enlarged main pulmonary arteries on the CT chest.  We did an echocardiogram which showed some right ventricular enlargement but normal systolic function and no evidence of right heart failure.  She has been participating in pulmonary rehab.  She continues to smoke.  She says she has cut back.  She has not been able to gain weight but her weight has been stable.  She still is adamantly in favor of surgery and does not want to do radiation.  She uses 2 L nasal cannula oxygen at night and as needed during the day.  Dr. Roxan Hockey described the proposed operation of robotic right VATS for wedge resection and probable right upper lobectomy.  We will base the decision to do lobectomy on the intraoperative frozen section of the nodule.  Dr. Roxan Hockey informed  her and her husband of the need for general anesthesia, the incisions to be used, the use of a drainage tube postoperatively, the expected hospital stay, and the overall recovery.  Dr. Roxan Hockey informed them of the indications, risk, benefits, and alternatives.  They understand and agree to proceed with surgery. She presented to [facility] on 05/26 in order to undergo a Xi robotic-assisted thoracoscopy, wedge resection, right upper lobe nodule with en bloc portion of middle lobe, right upper lobectomy, lymph node dissection, and intercostal nerve blocks.  Brief Hospital Course:  She remained afebrile and hemodynamically  stable.A line and foley were removed early in her post operative course. Chest tube was to water seal and there was an air leak with cough. Daily chest x rays were obtained and remained stable. Air leak did resolve and chest tube was removed on 05/28. Follow up chest x ray showed resolving small right pneumothorax. She is oxygenating well on on 2 liters via [place]. She was on oxygen prior to admission (PRN during the day and on it at night). She is tolerating a diet. All wounds are clean, dry, and healing without signs of infection. She has had issues with pain control post op; however, she had chronic pain issues (was on hydrocodone) prior to surgery. Chest x ray today showed small, stable right hydropneumothorax. She is felt surgically stable for discharge today.   Latest Vital Signs: Blood pressure 116/70, pulse (!) 104, temperature 97.6 F (36.4 C), temperature source Oral, resp. rate 19, height 5\' 3"  (1.6 m), weight 43.1 kg, SpO2 95 %.  Physical Exam:  General appearance: alert, cooperative and no distress Heart: regular rate and rhythm, S1, S2 normal, no murmur, click, rub or gallop Lungs: clear to auscultation bilaterally and rhonchi in lower lobes Abdomen: soft, non-tender; bowel sounds normal; no masses,  no organomegaly Extremities: extremities normal, atraumatic, no cyanosis or edema Wound: clean and dry  Discharge Condition: Stable and discharged to home.  Recent laboratory studies:  Lab Results  Component Value Date   WBC 5.8 07/31/2020   HGB 10.3 (L) 07/31/2020   HCT 33.1 (L) 07/31/2020   MCV 106.1 (H) 07/31/2020   PLT 144 (L) 07/31/2020   Lab Results  Component Value Date   NA 133 (L) 07/31/2020   K 3.7 07/31/2020   CL 99 07/31/2020   CO2 28 07/31/2020   CREATININE 0.56 07/31/2020   GLUCOSE 133 (H) 07/31/2020      Diagnostic Studies: DG Chest 2 View  Result Date: 08/03/2020 CLINICAL DATA:  65 year old female status post right upper lobectomy. Follow-up study. EXAM:  CHEST - 2 VIEW COMPARISON:  Chest x-ray 08/02/2020. FINDINGS: Postoperative changes of recent right upper lobectomy redemonstrated. There continues to be a small right-sided hydropneumothorax best appreciated on the lateral projection where there is predominantly upper and anterior pneumothorax and a small amount of fluid layering anteriorly. This is stable to slightly decreased in volume compared to yesterday's examination. Persistent opacities throughout the right middle and lower lobes likely reflect re-expansion pulmonary edema and/or residual areas of subsegmental atelectasis. Left lung is clear. No left pleural effusion. No left pneumothorax. Heart size is normal. Atherosclerotic calcifications in the thoracic aorta. Resolving subcutaneous emphysema in the right chest wall. Spinal cord stimulator in the midthoracic region incidentally noted. IMPRESSION: 1. Status post right upper lobectomy with slowly resolving right-sided hydropneumothorax, as above. 2. Aortic atherosclerosis. Electronically Signed   By: Vinnie Langton M.D.   On: 08/03/2020 06:16   DG Chest 2 View  Result Date: 08/02/2020 CLINICAL DATA:  65 year old female with history of pneumothorax. Cough. EXAM: CHEST - 2 VIEW COMPARISON:  Chest x-ray 08/01/2020. FINDINGS: Postoperative changes of right upper lobectomy are again noted, with compensatory hyperexpansion of the right middle and lower lobes. There continues to be some lucencies in the upper right hemithorax compatible with a small amount of residual right-sided pneumothorax, as well as some layering air-fluid levels, indicative of small right pleural effusion. Patchy ill-defined opacities and areas of interstitial prominence in the right lung likely reflect re-expansion pulmonary edema, or residual areas of atelectasis. Left lung is clear. No left pleural effusion. No left pneumothorax. No evidence of pulmonary edema. Heart size is normal. Upper mediastinal contours are within normal  limits. Aortic atherosclerosis. Spinal cord stimulator projecting over the midthoracic region. Subcutaneous emphysema in the right chest wall again noted. IMPRESSION: 1. Persistent small right-sided hydropneumothorax in this patient status post recent right upper lobectomy. 2. Aortic atherosclerosis. Electronically Signed   By: Vinnie Langton M.D.   On: 08/02/2020 08:17   DG Chest 2 View  Result Date: 08/01/2020 CLINICAL DATA:  65 year old female with history of right upper lobectomy. EXAM: CHEST - 2 VIEW COMPARISON:  07/31/2020 FINDINGS: Postsurgical changes after right upper lobectomy. Slight interval decrease in trace right superolateral pneumothorax. Trace right pleural effusion. The left lung is clear. Similar-appearing extensive right axillary and neck subcutaneous emphysema. Spinal cord stimulator remains in place in unchanged position. Atherosclerotic calcification of the aortic arch. No acute osseous abnormality. IMPRESSION: 1. Resolving small right pneumothorax. 2. Persistent right axillary and supraclavicular subcutaneous emphysema. Electronically Signed   By: Ruthann Cancer MD   On: 08/01/2020 09:15   DG Chest 2 View  Result Date: 07/28/2020 CLINICAL DATA:  65 year old female undergoing preoperative evaluation prior to lung resection EXAM: CHEST - 2 VIEW COMPARISON:  Chest CT 07/21/2020 FINDINGS: Stable appearance of right upper lobe pulmonary nodule. Cardiac and mediastinal contours are within normal limits. Atherosclerotic calcifications present in the transverse aorta. No pulmonary edema, pneumonia, pleural effusion or pneumothorax. Emphysema is present. Epidural spinal stimulator noted incidentally. IMPRESSION: 1. Stable chest x-ray without evidence of acute cardiopulmonary process. 2. Right upper lobe pulmonary nodule. 3. Emphysema. Electronically Signed   By: Jacqulynn Cadet M.D.   On: 07/28/2020 17:13   CT CHEST WO CONTRAST  Result Date: 07/21/2020 CLINICAL DATA:  Follow-up right  upper lobe pulmonary nodule. EXAM: CT CHEST WITHOUT CONTRAST TECHNIQUE: Multidetector CT imaging of the chest was performed following the standard protocol without IV contrast. COMPARISON:  03/26/2020 FINDINGS: Cardiovascular: No acute findings. Aortic and coronary atherosclerotic calcification noted. Mediastinum/Nodes: No masses or pathologically enlarged lymph nodes identified on this unenhanced exam. Lungs/Pleura: Moderate centrilobular emphysema again seen. Spiculated nodule in the inferolateral right upper lobe abuts the minor fissure and left lateral chest wall. This currently measures 1.9 x 1.9 cm on image 63/5, increased in size from 1.5 x 1.2 cm previously. No other suspicious pulmonary nodules masses identified. No evidence of pleural effusion. Upper Abdomen:  Unremarkable. Musculoskeletal: No suspicious bone lesions. Neurostimulator leads noted within the thoracic spinal canal. IMPRESSION: Increased size of 1.9 cm spiculated nodule in inferolateral right upper lobe, consistent with primary bronchogenic carcinoma. No evidence of lymphadenopathy or pleural effusion. Aortic Atherosclerosis (ICD10-I70.0) and Emphysema (ICD10-J43.9). Electronically Signed   By: Marlaine Hind M.D.   On: 07/21/2020 18:55   DG Chest 1V REPEAT Same Day  Result Date: 07/31/2020 CLINICAL DATA:  Chest tube removal.  Follow-up exam. EXAM: CHEST - 1 VIEW  SAME DAY COMPARISON:  07/23/2020 at 5:54 a.m., and older studies. FINDINGS: Right-sided chest tube has been removed. There is a small right pneumothorax best seen along the lateral aspect of the right mid to upper hemithorax. Right lung is better expanded than the earlier study. There is mild hazy opacity associated with pulmonary anastomosis staples in the central right upper lobe, unchanged from the earlier exam. Remainder of the right lung is clear. Left lung is hyperexpanded, but clear. Right-sided subcutaneous emphysema is stable. IMPRESSION: 1. Status post right chest tube  removal. A small right pneumothorax has developed following chest tube removal. Electronically Signed   By: Lajean Manes M.D.   On: 07/31/2020 12:24   DG CHEST PORT 1 VIEW  Result Date: 07/31/2020 CLINICAL DATA:  Follow-up after right lung surgery. Chest tube in place. EXAM: PORTABLE CHEST 1 VIEW COMPARISON:  07/30/2020 and older studies. FINDINGS: Postsurgical changes on the right are stable with right Peri/suprahilar pulmonary anastomosis staples and hazy opacity in the right upper lung bordering the minor fissure. Right-sided chest tube tip is stable, projecting at the right apex. Left lung is hyperexpanded, but clear. Right anterolateral chest wall and neck base subcutaneous emphysema is without significant change from the previous day's study. IMPRESSION: 1. No significant change from the previous day's exam. 2. Stable changes from right lung surgery. Stable right chest tube. No pneumothorax. 3. Subcutaneous emphysema along the right anterolateral chest wall and right neck base. Electronically Signed   By: Lajean Manes M.D.   On: 07/31/2020 08:31   DG CHEST PORT 1 VIEW  Result Date: 07/30/2020 CLINICAL DATA:  Chest tube, post lobectomy EXAM: PORTABLE CHEST 1 VIEW COMPARISON:  Portable exam 0619 hours compared to 07/29/2020 FINDINGS: RIGHT thoracostomy tube and intraspinal stimulator unchanged. Normal heart size, mediastinal contours, and pulmonary vascularity. Atherosclerotic calcification aorta. Emphysematous changes with postsurgical changes of the RIGHT upper lobe. Mild atelectasis versus infiltrate RIGHT upper lobe. Remaining lungs clear. No definite pleural effusion or pneumothorax. Increased RIGHT chest wall emphysema extending into RIGHT cervical region. IMPRESSION: Postsurgical changes RIGHT upper lobe with atelectasis versus infiltrate. Underlying COPD. Aortic Atherosclerosis (ICD10-I70.0) and Emphysema (ICD10-J43.9). Electronically Signed   By: Lavonia Dana M.D.   On: 07/30/2020 08:17   DG  Chest Port 1 View  Result Date: 07/29/2020 CLINICAL DATA:  Lobectomy.  Chest tube. EXAM: PORTABLE CHEST 1 VIEW COMPARISON:  07/27/2020 FINDINGS: Interval lobectomy on the right. Chest tube is in place. Small amount of pleural air at the apex. No unexpected pneumothorax. Left chest remains clear. Aortic atherosclerosis as seen previously. IMPRESSION: Status post lobectomy on the right. Chest tube is in place. Small amount of expected pleural air. Electronically Signed   By: Nelson Chimes M.D.   On: 07/29/2020 14:26   DG [facility] DEXA  Result Date: 07/15/2020 CLINICAL DATA:  Postmenopausal. EXAM: DUAL X-RAY ABSORPTIOMETRY (DXA) FOR BONE MINERAL DENSITY TECHNIQUE: Bone mineral density measurements are performed of the spine, hip, and forearm, as appropriate, per International Society of Clinical Densitometry recommendations. The pertinent regions of interest are reported below. Non-contributory values are not reported. Images are obtained for bone mineral density measurement and are not obtained for diagnostic purposes. FINDINGS: Distal 1/3 radius, left Bone Mineral Density (BMD):  0.460 g/cm2 Young Adult T-Score:  -4.8 Z-Score:  -3.3 RIGHT FEMUR NECK Bone Mineral Density (BMD):  0.659 g/cm2 Young Adult T-Score: -2.6 Z-Score:  -0.7 Unit: This study was performed at [facility] on a [place]. Scan quality: The scan  quality is good. Exclusions: Lumbar spine ASSESSMENT: Patient's diagnostic category is OSTEOPOROSIS by WHO Criteria. FRACTURE RISK: INCREASED FRAX: World Health Organization FRAX assessment of absolute fracture risk is not calculated for this patient because the patient has osteoporosis. COMPARISON: None. RECOMMENDATIONS 1. All patients should optimize calcium and vitamin D intake. 2. Consider FDA-approved medical therapies in postmenopausal women and men aged 38 years and older, based on the following: - A hip or vertebral (clinical or morphometric) fracture - T-score less  than or equal to -2.5 at the femoral neck or spine after appropriate evaluation to exclude secondary causes - Low bone mass (T-score between -1.0 and -2.5 at the femoral neck or spine) and a 10-year probability of a hip fracture greater than or equal to 3% or a 10-year probability of a major osteoporosis-related fracture greater than or equal to 20% based on the [place]-adapted WHO algorithm - Clinician judgment and/or patient preferences may indicate treatment for people with 10-year fracture probabilities above or below these levels 3. Patients with diagnosis of osteoporosis or at high risk for fracture should have regular bone mineral density tests. For patients eligible for Medicare, routine testing is allowed once every 2 years. The testing frequency can be increased to one year for patients who have rapidly progressing disease, those who are receiving or discontinuing medical therapy to restore bone mass, or have additional risk factors. Electronically Signed   By: Dorise Bullion III M.D   On: 07/15/2020 12:04    Discharge Medications: Allergies as of 08/03/2020      Reactions   Lyrica [pregabalin] Other (See Comments)   EXTREME SHAKING/TREMBLING   Darvon [propoxyphene]    UNSPECIFIED REACTION    Gabapentin    Extreme shaking and trembling    Augmentin [amoxicillin-pot Clavulanate] Nausea And Vomiting   Has patient had a PCN reaction causing immediate rash, facial/tongue/throat swelling, SOB or lightheadedness with hypotension:no Has patient had a PCN reaction causing severe rash involving mucus membranes or skin necrosis:No Has patient had a PCN reaction that required hospitalization:No Has patient had a PCN reaction occurring within the last 10 years:Yes--ONLY N/V If all of the above answers are "NO", then may proceed with Cephalosporin use.   Erythromycin Itching, Rash      Vibramycin [doxycycline Calcium] Itching, Rash   "SPLOTCHING "      Medication List    TAKE these medications    acetaminophen 325 MG tablet Commonly known as: TYLENOL Take 2 tablets (650 mg total) by mouth every 6 (six) hours as needed for mild pain, fever or headache (or Fever >/= 101).   amLODipine 10 MG tablet Commonly known as: NORVASC Take 1 tablet (10 mg total) by mouth daily.   atorvastatin 40 MG tablet Commonly known as: Lipitor Take 1 tablet (40 mg total) by mouth daily.   budesonide-formoterol 160-4.5 MCG/ACT inhaler Commonly known as: SYMBICORT USE 2 INHALATIONS TWICE A DAY What changed: See the new instructions.   cetirizine 10 MG tablet Commonly known as: ZYRTEC Take 10 mg by mouth daily as needed for allergies.   Cholecalciferol 125 MCG (5000 UT) capsule Take 5,000 Units daily by mouth.   diclofenac sodium 1 % Gel Commonly known as: VOLTAREN Apply 1 application topically daily as needed for muscle pain.   DULoxetine 30 MG capsule Commonly known as: CYMBALTA Take 3 capsules (90 mg total) by mouth daily.   feeding supplement Liqd Take 237 mLs by mouth 2 (two) times daily between meals.   guaiFENesin 600 MG 12 hr  tablet Commonly known as: MUCINEX Take 600 mg by mouth 2 (two) times daily.   HYDROcodone Bitartrate ER 60 MG T24a Take 60 mg by mouth daily.   HYDROcodone-acetaminophen 7.5-325 MG tablet Commonly known as: NORCO Take 1 tablet by mouth 4 (four) times daily.   ipratropium-albuterol 0.5-2.5 (3) MG/3ML Soln Commonly known as: DUONEB Inhale 3 mLs into the lungs 4 (four) times daily as needed (for shortness of breath/wheezing). *May use as needed   levalbuterol 45 MCG/ACT inhaler Commonly known as: Xopenex HFA Inhale 2 puffs into the lungs every 6 (six) hours as needed for wheezing or shortness of breath.   magnesium oxide 400 (241.3 Mg) MG tablet Commonly known as: MAG-OX Take 400 mg by mouth 2 (two) times daily.   Metanx 3-90.314-2-35 MG Caps TAKE 1 TABLET BY MOUTH TWO TIMES A DAY What changed: when to take this   nicotine 14 mg/24hr  patch Commonly known as: Nicoderm CQ Place 1 patch (14 mg total) onto the skin daily.   nicotine 7 mg/24hr patch Commonly known as: Nicoderm CQ Place 1 patch (7 mg total) onto the skin daily.   omeprazole 40 MG capsule Commonly known as: PRILOSEC Take 1 capsule (40 mg total) by mouth 2 (two) times daily. Take 1 capsule 30 mins before breakfast and evening meal.   ondansetron 8 MG disintegrating tablet Commonly known as: ZOFRAN-ODT Take 8 mg by mouth every 8 (eight) hours as needed for vomiting or nausea.   OXYGEN Inhale 2 L into the lungs See admin instructions. 2lpm with sleep and as needed during the day   Poly-Iron 150 Forte 150-0.025-1 MG Caps Generic drug: Iron Polysacch Cmplx-B12-FA Take 1 capsule by mouth 2 (two) times daily. What changed: when to take this   Spiriva Respimat 2.5 MCG/ACT Aers Generic drug: Tiotropium Bromide Monohydrate USE 2 INHALATIONS DAILY What changed: See the new instructions.   tizanidine 6 MG capsule Commonly known as: ZANAFLEX Take 6 mg by mouth 3 (three) times daily.   traZODone 150 MG tablet Commonly known as: DESYREL TAKE 1 TO 2 TABLETS AT BEDTIME What changed: See the new instructions.       Follow Up Appointments:  Follow-up Information    Melrose Nakayama, MD. Go on 08/17/2020.   Specialty: Cardiothoracic Surgery Why: PA/LAT CXR to be taken (at [facility] which is in the same building as Dr. Leonarda Salon office) on 06/14 at 3:45 pm;Appointment time is at 4:15 pm Contact information: [place] [address] [place], [place], DO. Call in 1 day(s).   Specialty: Family Medicine Contact information: [place] [address] [phone]               Signed: Alvester Chou 08/03/2020, 8:19 AM

## 2020-07-30 NOTE — Progress Notes (Addendum)
      [address]       [place] [address]             [phone]       1 Day Post-Op Procedure(s) (LRB): XI ROBOTIC ASSISTED THORASCOPY-WEDGE RESECTION, WITH RIGHT UPPER LOBECTOMY (Right) INTERCOSTAL NERVE BLOCK (Right) LYMPH NODE BIOPSY (Right)  Subjective: Patient requesting Trazodone for sleep;as she has taken for many years. She does have some incisional/right chest tube pain. She denies nausea or vomiting.  Objective: Vital signs in last 24 hours: Temp:  [97.4 F (36.3 C)-99 F (37.2 C)] 98.4 F (36.9 C) (05/27 0318) Pulse Rate:  [85-108] 93 (05/26 2330) Cardiac Rhythm: Normal sinus rhythm;Heart block (05/26 2000) Resp:  [12-24] 19 (05/27 0558) BP: (123-155)/(74-88) 155/83 (05/27 0318) SpO2:  [95 %-100 %] 98 % (05/27 0318) Arterial Line BP: (132-152)/(67-69) 132/67 (05/26 1325)      Intake/Output from previous day: 05/26 0701 - 05/27 0700 In: 2708.7 [P.O.:480; I.V.:1778.7; IV Piggyback:450] Out: 4355 [Urine:4025; Blood:30; Chest Tube:300]   Physical Exam:  Cardiovascular: RRR. Pulmonary: Clear to auscultation on the left and coarse on the right Abdomen: Soft, non tender, bowel sounds present. Extremities: No lower extremity edema. Wounds: Clean and dry.  No erythema or signs of infection. Chest Tube: to water seal, air leak with cough  Lab Results: CZY:SAYTKZ Labs    07/27/20 1142 07/30/20 0029  WBC 5.4 8.8  HGB 12.1 10.9*  HCT 39.1 33.9*  PLT 251 184   BMET:  Recent Labs    07/27/20 1142 07/30/20 0029  NA 139 135  K 3.7 4.3  CL 104 98  CO2 20* 27  GLUCOSE 83 123*  BUN 12 9  CREATININE 0.50 0.52  CALCIUM 8.9 9.6    PT/INR:  Recent Labs    07/27/20 1142  LABPROT 11.5  INR 0.8   ABG:  INR: Will add last result for INR, ABG once components are confirmed Will add last 4 CBG results once components are confirmed  Assessment/Plan:  1. CV - SR, hypertensive. 2.  Pulmonary - On 2 liters via [place]. Wean as able. Chest  tube with 300 cc of output since surgery and chest tube is to water seal. There is an air leak with cough. CXR this am appears stable (subcutaneous emphysema right lateral chest wall). Await final pathology.Chest tube to remain for now. Encourage incentive spirometer. 3. Per patient, requesting Trazodone as has taken for 30+ years to sleep; is on order for this evening 4. Decrease IVF  Donielle M ZimmermanPA-C 07/30/2020,7:25 AM Patient seen and examined, agree with above Will restart long acting hydrocodone SCD + enoxaparin for DVT prophylaxis Ambulate  Remo Lipps C. Roxan Hockey, MD [facility] [phone]

## 2020-07-30 NOTE — Progress Notes (Signed)
PT Cancellation Note  Patient Details Name: Jasmine [name] MRN: [id number] DOB: Aug 19, 1955   Cancelled Treatment:    Reason Eval/Treat Not Completed: Patient declined, no reason specified (pt refused mobility at this time stating she hasn't slept and just had pain medicine and needs to sleep. Requesting deferral to PM or next date)   Lamarr Lulas 07/30/2020, 12:15 PM  Bayard Males, PT Acute Rehabilitation Services Pager: [phone] Office: [phone]

## 2020-07-31 ENCOUNTER — Inpatient Hospital Stay (HOSPITAL_COMMUNITY): Payer: Medicare Other

## 2020-07-31 LAB — CBC
HCT: 33.1 % — ABNORMAL LOW (ref 36.0–46.0)
Hemoglobin: 10.3 g/dL — ABNORMAL LOW (ref 12.0–15.0)
MCH: 33 pg (ref 26.0–34.0)
MCHC: 31.1 g/dL (ref 30.0–36.0)
MCV: 106.1 fL — ABNORMAL HIGH (ref 80.0–100.0)
Platelets: 144 10*3/uL — ABNORMAL LOW (ref 150–400)
RBC: 3.12 MIL/uL — ABNORMAL LOW (ref 3.87–5.11)
RDW: 15.3 % (ref 11.5–15.5)
WBC: 5.8 10*3/uL (ref 4.0–10.5)
nRBC: 0 % (ref 0.0–0.2)

## 2020-07-31 LAB — COMPREHENSIVE METABOLIC PANEL
ALT: 55 U/L — ABNORMAL HIGH (ref 0–44)
AST: 86 U/L — ABNORMAL HIGH (ref 15–41)
Albumin: 2.9 g/dL — ABNORMAL LOW (ref 3.5–5.0)
Alkaline Phosphatase: 71 U/L (ref 38–126)
Anion gap: 6 (ref 5–15)
BUN: 10 mg/dL (ref 8–23)
CO2: 28 mmol/L (ref 22–32)
Calcium: 9.3 mg/dL (ref 8.9–10.3)
Chloride: 99 mmol/L (ref 98–111)
Creatinine, Ser: 0.56 mg/dL (ref 0.44–1.00)
GFR, Estimated: >60 mL/min (ref >60)
Glucose, Bld: 133 mg/dL — ABNORMAL HIGH (ref 70–99)
Potassium: 3.7 mmol/L (ref 3.5–5.1)
Sodium: 133 mmol/L — ABNORMAL LOW (ref 135–145)
Total Bilirubin: 0.6 mg/dL (ref 0.3–1.2)
Total Protein: 5.5 g/dL — ABNORMAL LOW (ref 6.5–8.1)

## 2020-07-31 MED ORDER — ENOXAPARIN SODIUM 30 MG/0.3ML IJ SOSY
30.0000 mg | PREFILLED_SYRINGE | Freq: Every day | INTRAMUSCULAR | Status: DC
  Administered 2020-07-31 – 2020-08-02 (×3): 30 mg via SUBCUTANEOUS
  Filled 2020-07-31 (×3): qty 0.3

## 2020-07-31 NOTE — Evaluation (Signed)
Physical Therapy Evaluation Patient Details Name: Jasmine [name] MRN: [id number] DOB: [birth year] Today's Date: 07/31/2020   History of Present Illness  pt is a 65 y/o female presenting 5/26 for surgery for R upper lobe lung nodule.  s/p Si robotic-assisted thoracoscopy, wedge resection--right upper lobe nodule, right upper lobectomy.  PMHx:  COPD, tobacco abuse, HTN, sciatica, neuropathy, R foot drop, cervica/lumbar fusion sx's, spinal cord stimulators  Clinical Impression  Pt admitted with/for R lung nodule, s/p tumor resenction/R Upper lobectomy.  Pt currently limited functionally due to the problems listed below.  (see problems list.)  Pt will benefit from PT to maximize function and safety to be able to get home safely with available assist.     Follow Up Recommendations No PT follow up;Supervision - Intermittent    Equipment Recommendations  None recommended by PT    Recommendations for Other Services       Precautions / Restrictions Precautions Precautions: Fall      Mobility  Bed Mobility Overal bed mobility: Modified Independent             General bed mobility comments: HOB raised, but pt moving well, boosting up in bed well.    Transfers Overall transfer level: Needs assistance   Transfers: Sit to/from Stand;Stand Pivot Transfers Sit to Stand: Supervision;Modified independent (Device/Increase time) Stand pivot transfers: Supervision;Modified independent (Device/Increase time)          Ambulation/Gait Ambulation/Gait assistance: Supervision Gait Distance (Feet): 200 Feet (x2) Assistive device: 4-wheeled walker Gait Pattern/deviations: Step-through pattern   Gait velocity interpretation: <1.8 ft/sec, indicate of risk for recurrent falls General Gait Details: steady with rollator, sats on 3L [place] maintatin>95%, EHR consistently 110's/low 120's, with spike to 140 bpm.  mild dyspnea only  Stairs Stairs: Yes Stairs assistance: Min guard Stair Management:  One rail Left;Step to pattern;Alternating pattern;Forwards;Sideways Number of Stairs: 4 General stair comments: safe with rail  Wheelchair Mobility    Modified Rankin (Stroke Patients Only)       Balance Overall balance assessment: Needs assistance Sitting-balance support: No upper extremity supported;Feet supported Sitting balance-Leahy Scale: Good     Standing balance support: Bilateral upper extremity supported;No upper extremity supported;During functional activity Standing balance-Leahy Scale: Fair                               Pertinent Vitals/Pain Pain Assessment: Faces Faces Pain Scale: No hurt Pain Intervention(s): Monitored during session    Home Living Family/patient expects to be discharged to:: Private residence Living Arrangements: Spouse/significant other Available Help at Discharge: Family;Available 24 hours/day Type of Home: House Home Access: Stairs to enter Entrance Stairs-Rails: Left Entrance Stairs-Number of Steps: 3 Home Layout: Able to live on main level with bedroom/bathroom;Two level Home Equipment: Walker - 4 wheels;Bedside commode;Shower seat;Grab bars - toilet;Grab bars - tub/shower;Wheelchair - manual      Prior Function Level of Independence: Independent with assistive device(s)         Comments: in a home-like environment I, husband assists transfer in/out of shower.     Hand Dominance        Extremity/Trunk Assessment   Upper Extremity Assessment Upper Extremity Assessment: Overall WFL for tasks assessed    Lower Extremity Assessment Lower Extremity Assessment: Overall WFL for tasks assessed    Cervical / Trunk Assessment Cervical / Trunk Assessment: Normal  Communication   Communication: No difficulties  Cognition Arousal/Alertness: Awake/alert Behavior During Therapy: WFL for tasks assessed/performed Overall  Cognitive Status: Within Functional Limits for tasks assessed                                         General Comments General comments (skin integrity, edema, etc.): vss on RA at rest or 3L with mobility    Exercises     Assessment/Plan    PT Assessment Patient needs continued PT services  PT Problem List Decreased activity tolerance;Decreased mobility;Decreased knowledge of use of DME;Cardiopulmonary status limiting activity       PT Treatment Interventions Gait training;Functional mobility training;Therapeutic activities;Patient/family education;DME instruction    PT Goals (Current goals can be found in the Care Plan section)  Acute Rehab PT Goals PT Goal Formulation: With patient Time For Goal Achievement: 08/07/20 Potential to Achieve Goals: Good    Frequency Min 3X/week   Barriers to discharge        Co-evaluation               AM-PAC PT "6 Clicks" Mobility  Outcome Measure Help needed turning from your back to your side while in a flat bed without using bedrails?: None Help needed moving from lying on your back to sitting on the side of a flat bed without using bedrails?: None Help needed moving to and from a bed to a chair (including a wheelchair)?: A Little Help needed standing up from a chair using your arms (e.g., wheelchair or bedside chair)?: A Little Help needed to walk in hospital room?: A Little Help needed climbing 3-5 steps with a railing? : A Little 6 Click Score: 20    End of Session Equipment Utilized During Treatment: Oxygen Activity Tolerance: Patient tolerated treatment well Patient left: in bed;with bed alarm set;with family/visitor present Nurse Communication: Mobility status PT Visit Diagnosis: Other abnormalities of gait and mobility (R26.89);Difficulty in walking, not elsewhere classified (R26.2)    Time: 7014-1030 PT Time Calculation (min) (ACUTE ONLY): 45 min   Charges:   PT Evaluation $PT Eval Moderate Complexity: 1 Mod PT Treatments $Gait Training: 8-22 mins $Therapeutic Activity: 8-22 mins         07/31/2020  Ginger Carne., PT Acute Rehabilitation Services [phone]  (pager) [phone]  (office)  [name] Kamaiyah Uselton 07/31/2020, 3:37 PM

## 2020-07-31 NOTE — Progress Notes (Signed)
Chest tube removed without difficulty. Patient tolerated well. Petroleum gauze over site and sutures tied. Tagaderm over site

## 2020-07-31 NOTE — Progress Notes (Signed)
2 Days Post-Op Procedure(s) (LRB): XI ROBOTIC ASSISTED THORASCOPY-WEDGE RESECTION, WITH RIGHT UPPER LOBECTOMY (Right) INTERCOSTAL NERVE BLOCK (Right) LYMPH NODE BIOPSY (Right) Subjective: Still has incisional pain, but improved  Objective: Vital signs in last 24 hours: Temp:  [97.6 F (36.4 C)-98.5 F (36.9 C)] 98.5 F (36.9 C) (05/28 0742) Pulse Rate:  [83-110] 110 (05/28 0742) Cardiac Rhythm: Sinus tachycardia (05/28 0700) Resp:  [10-19] 19 (05/28 0742) BP: (128-146)/(80-96) 128/88 (05/28 0742) SpO2:  [94 %-99 %] 98 % (05/28 0742)  Hemodynamic parameters for last 24 hours:    Intake/Output from previous day: 05/27 0701 - 05/28 0700 In: 1988.6 [P.O.:480; I.V.:1508.6] Out: 870 [Urine:500; Chest Tube:370] Intake/Output this shift: No intake/output data recorded.  General appearance: alert, cooperative and no distress Neurologic: intact Heart: regular rate and rhythm Lungs: diminished breath sounds right base no air leak  Lab Results: Recent Labs    07/30/20 0029 07/31/20 0015  WBC 8.8 5.8  HGB 10.9* 10.3*  HCT 33.9* 33.1*  PLT 184 144*   BMET:  Recent Labs    07/30/20 0029 07/31/20 0015  NA 135 133*  K 4.3 3.7  CL 98 99  CO2 27 28  GLUCOSE 123* 133*  BUN 9 10  CREATININE 0.52 0.56  CALCIUM 9.6 9.3    PT/INR: No results for input(s): LABPROT, INR in the last 72 hours. ABG    Component Value Date/Time   PHART 7.340 (L) 07/27/2020 1157   HCO3 21.9 07/27/2020 1157   ACIDBASEDEF 2.9 (H) 07/27/2020 1157   O2SAT 93.2 07/27/2020 1157   CBG (last 3)  No results for input(s): GLUCAP in the last 72 hours.  Assessment/Plan: S/P Procedure(s) (LRB): XI ROBOTIC ASSISTED THORASCOPY-WEDGE RESECTION, WITH RIGHT UPPER LOBECTOMY (Right) INTERCOSTAL NERVE BLOCK (Right) LYMPH NODE BIOPSY (Right) Doing well POD # 2  No air leak- will dc chest tube CXR shows some opacity of RML- monitor Pain control improved Needs to ambulate more SCD + enoxaparin   LOS: 2  days    Melrose Nakayama 07/31/2020

## 2020-08-01 ENCOUNTER — Inpatient Hospital Stay (HOSPITAL_COMMUNITY): Payer: Medicare Other

## 2020-08-01 MED ORDER — METOPROLOL TARTRATE 5 MG/5ML IV SOLN
2.5000 mg | INTRAVENOUS | Status: AC | PRN
  Administered 2020-08-01 – 2020-08-03 (×3): 2.5 mg via INTRAVENOUS
  Filled 2020-08-01 (×2): qty 5

## 2020-08-01 NOTE — Progress Notes (Addendum)
      [address]       [place] [address]             [phone]       3 Days Post-Op Procedure(s) (LRB): XI ROBOTIC ASSISTED THORASCOPY-WEDGE RESECTION, WITH RIGHT UPPER LOBECTOMY (Right) INTERCOSTAL NERVE BLOCK (Right) LYMPH NODE BIOPSY (Right)  Subjective: Patient states recently returned from x ray and is having some incisional pain.  Objective: Vital signs in last 24 hours: Temp:  [97.6 F (36.4 C)-99 F (37.2 C)] 97.7 F (36.5 C) (05/29 0726) Pulse Rate:  [90-113] 113 (05/29 0726) Cardiac Rhythm: Normal sinus rhythm (05/29 0700) Resp:  [13-19] 18 (05/29 0726) BP: (121-166)/(75-98) 137/83 (05/29 0726) SpO2:  [95 %-100 %] 95 % (05/29 0726)     Intake/Output from previous day: 05/28 0701 - 05/29 0700 In: 240 [P.O.:240] Out: -    Physical Exam:  Cardiovascular: Tachycardic Pulmonary: Clear to auscultation on the left and coarse on the right Abdomen: Soft, non tender, bowel sounds present. Extremities: No lower extremity edema. Wounds: Clean and dry.  No erythema or signs of infection.   Lab Results: CBC: Recent Labs    07/30/20 0029 07/31/20 0015  WBC 8.8 5.8  HGB 10.9* 10.3*  HCT 33.9* 33.1*  PLT 184 144*   BMET:  Recent Labs    07/30/20 0029 07/31/20 0015  NA 135 133*  K 4.3 3.7  CL 98 99  CO2 27 28  GLUCOSE 123* 133*  BUN 9 10  CREATININE 0.52 0.56  CALCIUM 9.6 9.3    PT/INR:  No results for input(s): LABPROT, INR in the last 72 hours. ABG:  INR: Will add last result for INR, ABG once components are confirmed Will add last 4 CBG results once components are confirmed  Assessment/Plan:  1. CV - ST at times. On Amlodipine 10 mg daily as taken prior to surgery 2.  Pulmonary - On 2 liters via [place]. Of note, she uses 2 L of oxygen at nigh and PRN during the day prior to surgery. CXR this am appears stable (subcutaneous emphysema right lateral chest wall), probable trace right apical pneumothorax.  Await final pathology.  Encourage incentive spirometer. 3. Will discuss disposition with Dr. Edman Circle M ZimmermanPA-C 08/01/2020,9:03 AM Patient seen and examined, agree with above Likely home tomorrow  Revonda Standard. Roxan Hockey, MD [facility] [phone]

## 2020-08-01 NOTE — Progress Notes (Signed)
Pt woke up from her nap, very confused, asking for pain meds. HR sustained 130-140, ST, no afib on the monitor. Assess pt, MD paged about elevated HR. Verbal orders given. Lopressor 2.5 given x2. Reassess pt later in the afternoon. Pt stable, back in SR HR 95.

## 2020-08-01 NOTE — Plan of Care (Signed)

## 2020-08-02 ENCOUNTER — Inpatient Hospital Stay (HOSPITAL_COMMUNITY): Payer: Medicare Other

## 2020-08-02 MED ORDER — IPRATROPIUM-ALBUTEROL 20-100 MCG/ACT IN AERS
2.0000 | INHALATION_SPRAY | Freq: Four times a day (QID) | RESPIRATORY_TRACT | Status: DC | PRN
  Filled 2020-08-02: qty 4

## 2020-08-02 NOTE — Progress Notes (Signed)
Physical Therapy Treatment Patient Details Name: Jasmine [name] MRN: [id number] DOB: 03-30-1955 Today's Date: 08/02/2020    History of Present Illness 65 year old admitted 5/26 s/p  RUL wedge resection . PMHx: asthma, COPD, tobacco abuse, reflux, hypertension, hyperglycemia, anemia, anxiety, chronic pain, sciatica, neuropathy, right foot drop, pancreatitis    PT Comments    Pt pleasant and very willing to walk with increased distance completed today with use of rollator. Pt requires 2L for activity and gait to maintain sats >90%. Pt educated for [employer] with showering, O2 immediately after showering, sitting to dry off after bathing, pulse ox use and energy conservation throughout the day. Pt verbalized understanding and states she uses O2 intermittently and only checks pulse ox every other day.     Follow Up Recommendations  No PT follow up;Supervision - Intermittent     Equipment Recommendations  None recommended by PT    Recommendations for Other Services       Precautions / Restrictions Precautions Precautions: Fall    Mobility  Bed Mobility Overal bed mobility: Modified Independent             General bed mobility comments: HOB 30 degrees    Transfers Overall transfer level: Modified independent               General transfer comment: pt able to stand from bed and toilet without assist  Ambulation/Gait Ambulation/Gait assistance: Supervision Gait Distance (Feet): 700 Feet Assistive device: 4-wheeled walker Gait Pattern/deviations: Step-through pattern   Gait velocity interpretation: 1.31 - 2.62 ft/sec, indicative of limited community ambulator General Gait Details: cues for safety and energy conservation. 3 standing pauses with gait. HR 110-135 with gait with SpO2 92-95% on 2L. Attempted gait with 1L with desaturation to 87%   Stairs             Wheelchair Mobility    Modified Rankin (Stroke Patients Only)       Balance  Overall balance assessment: Needs assistance   Sitting balance-Leahy Scale: Good     Standing balance support: Bilateral upper extremity supported;No upper extremity supported;During functional activity Standing balance-Leahy Scale: Fair Standing balance comment: rollator for gait able to stand after toileting and walk 10' without UE support                            Cognition Arousal/Alertness: Awake/alert Behavior During Therapy: WFL for tasks assessed/performed Overall Cognitive Status: Within Functional Limits for tasks assessed                                        Exercises      General Comments        Pertinent Vitals/Pain Pain Assessment: No/denies pain    Home Living                      Prior Function            PT Goals (current goals can now be found in the care plan section) Progress towards PT goals: Progressing toward goals    Frequency    Min 3X/week      PT Plan Current plan remains appropriate    Co-evaluation              AM-PAC PT "6 Clicks" Mobility   Outcome Measure  Help needed turning from  your back to your side while in a flat bed without using bedrails?: None Help needed moving from lying on your back to sitting on the side of a flat bed without using bedrails?: None Help needed moving to and from a bed to a chair (including a wheelchair)?: None Help needed standing up from a chair using your arms (e.g., wheelchair or bedside chair)?: A Little Help needed to walk in hospital room?: A Little Help needed climbing 3-5 steps with a railing? : A Little 6 Click Score: 21    End of Session Equipment Utilized During Treatment: Oxygen Activity Tolerance: Patient tolerated treatment well Patient left: in bed;with call bell/phone within reach Nurse Communication: Mobility status PT Visit Diagnosis: Other abnormalities of gait and mobility (R26.89);Difficulty in walking, not elsewhere classified  (R26.2)     Time: 4081-4481 PT Time Calculation (min) (ACUTE ONLY): 25 min  Charges:  $Gait Training: 23-37 mins                     Bayard Males, PT Acute Rehabilitation Services Pager: [phone] Office: [place] 08/02/2020, 11:57 AM

## 2020-08-02 NOTE — Plan of Care (Signed)

## 2020-08-02 NOTE — Progress Notes (Signed)
4 Days Post-Op Procedure(s) (LRB): XI ROBOTIC ASSISTED THORASCOPY-WEDGE RESECTION, WITH RIGHT UPPER LOBECTOMY (Right) INTERCOSTAL NERVE BLOCK (Right) LYMPH NODE BIOPSY (Right) Subjective: A little better today, had a bad day yesterday  Objective: Vital signs in last 24 hours: Temp:  [97.8 F (36.6 C)-99.4 F (37.4 C)] 99.1 F (37.3 C) (05/30 0725) Pulse Rate:  [86-117] 115 (05/30 0725) Cardiac Rhythm: Sinus tachycardia (05/30 0700) Resp:  [15-20] 19 (05/30 0725) BP: (95-153)/(63-87) 150/82 (05/30 0725) SpO2:  [92 %-99 %] 94 % (05/30 0725)  Hemodynamic parameters for last 24 hours:    Intake/Output from previous day: 05/29 0701 - 05/30 0700 In: 720 [P.O.:720] Out: 100 [Urine:100] Intake/Output this shift: No intake/output data recorded.  General appearance: alert, cooperative and no distress Neurologic: intact Heart: tachy, regular Lungs: diminished breath sounds right base, no wheezing Wound: clean and dry  Lab Results: Recent Labs    07/31/20 0015  WBC 5.8  HGB 10.3*  HCT 33.1*  PLT 144*   BMET:  Recent Labs    07/31/20 0015  NA 133*  K 3.7  CL 99  CO2 28  GLUCOSE 133*  BUN 10  CREATININE 0.56  CALCIUM 9.3    PT/INR: No results for input(s): LABPROT, INR in the last 72 hours. ABG    Component Value Date/Time   PHART 7.340 (L) 07/27/2020 1157   HCO3 21.9 07/27/2020 1157   ACIDBASEDEF 2.9 (H) 07/27/2020 1157   O2SAT 93.2 07/27/2020 1157   CBG (last 3)  No results for input(s): GLUCAP in the last 72 hours.  Assessment/Plan: S/P Procedure(s) (LRB): XI ROBOTIC ASSISTED THORASCOPY-WEDGE RESECTION, WITH RIGHT UPPER LOBECTOMY (Right) INTERCOSTAL NERVE BLOCK (Right) LYMPH NODE BIOPSY (Right) -POD # 4 Pain control remains an issue. Was on high doses of hydrocodone preop, now on oxycontin and PRn oxycodone Dc Toradol CXr stable Needs to ambulate Hopefully ready for DC tomorrow   LOS: 4 days    Melrose Nakayama 08/02/2020

## 2020-08-03 ENCOUNTER — Inpatient Hospital Stay (HOSPITAL_COMMUNITY): Payer: Medicare Other

## 2020-08-03 ENCOUNTER — Encounter (HOSPITAL_COMMUNITY): Payer: Medicare Other

## 2020-08-03 NOTE — Progress Notes (Signed)
Discharge instructions reviewed and patient verbalized understanding.  Written copy of instructions given to patient with all appointments highlighted  Via wheelchair to spouses waiting car in stable condition

## 2020-08-03 NOTE — Progress Notes (Signed)
      [address]       [place] [address]             [phone]      5 Days Post-Op Procedure(s) (LRB): XI ROBOTIC ASSISTED THORASCOPY-WEDGE RESECTION, WITH RIGHT UPPER LOBECTOMY (Right) INTERCOSTAL NERVE BLOCK (Right) LYMPH NODE BIOPSY (Right) Subjective: Feels okay this morning, knows to hold her pillow when she is coughing or sneezing.   Objective: Vital signs in last 24 hours: Temp:  [97.6 F (36.4 C)-98.4 F (36.9 C)] 97.6 F (36.4 C) (05/31 0734) Pulse Rate:  [90-105] 104 (05/31 0734) Cardiac Rhythm: Sinus tachycardia (05/31 0711) Resp:  [15-20] 19 (05/31 0734) BP: (116-180)/(70-86) 116/70 (05/31 0734) SpO2:  [94 %-97 %] 95 % (05/31 0334)     Intake/Output from previous day: 05/30 0701 - 05/31 0700 In: 480 [P.O.:480] Out: -  Intake/Output this shift: No intake/output data recorded.  General appearance: alert, cooperative and no distress Heart: regular rate and rhythm, S1, S2 normal, no murmur, click, rub or gallop Lungs: clear to auscultation bilaterally and rhonchi in lower lobes Abdomen: soft, non-tender; bowel sounds normal; no masses,  no organomegaly Extremities: extremities normal, atraumatic, no cyanosis or edema Wound: clean and dry  Lab Results: No results for input(s): WBC, HGB, HCT, PLT in the last 72 hours. BMET: No results for input(s): NA, K, CL, CO2, GLUCOSE, BUN, CREATININE, CALCIUM in the last 72 hours.  PT/INR: No results for input(s): LABPROT, INR in the last 72 hours. ABG    Component Value Date/Time   PHART 7.340 (L) 07/27/2020 1157   HCO3 21.9 07/27/2020 1157   ACIDBASEDEF 2.9 (H) 07/27/2020 1157   O2SAT 93.2 07/27/2020 1157   CBG (last 3)  No results for input(s): GLUCAP in the last 72 hours.  Assessment/Plan: S/P Procedure(s) (LRB): XI ROBOTIC ASSISTED THORASCOPY-WEDGE RESECTION, WITH RIGHT UPPER LOBECTOMY (Right) INTERCOSTAL NERVE BLOCK (Right) LYMPH NODE BIOPSY (Right)  1. CV-NSR in the 70s, BP  elevated early this morning but back down to 120/77. Continue Norvasc and Lipitor 2. Pulm- Remains on 2L . She is on home oxygen. Slowly resolving right-sided hydropneumothorax on CXR. 3. Renal-creatinine 0.56, electrolytes okay 4. H and H stable 10.3/33.1 5. Chronic pain-will discharge on home pain medication regimen  Plan: Okay for discharge today. Will follow-up in 2 weeks with Dr. Roxan Hockey and get her chest tube sutures removed at that time.    LOS: 5 days    Elgie Collard 08/03/2020

## 2020-08-03 NOTE — Plan of Care (Signed)
  Problem: Education: Goal: Knowledge of General Education information will improve Description: Including pain rating scale, medication(s)/side effects and non-pharmacologic comfort measures Outcome: Progressing   Problem: Health Behavior/Discharge Planning: Goal: Ability to manage health-related needs will improve Outcome: Progressing   Problem: Clinical Measurements: Goal: Ability to maintain clinical measurements within normal limits will improve Outcome: Progressing   Problem: Activity: Goal: Risk for activity intolerance will decrease Outcome: Progressing   Problem: Nutrition: Goal: Adequate nutrition will be maintained Outcome: Progressing   Problem: Coping: Goal: Level of anxiety will decrease Outcome: Progressing   Problem: Elimination: Goal: Will not experience complications related to bowel motility Outcome: Progressing Goal: Will not experience complications related to urinary retention Outcome: Progressing   Problem: Pain Managment: Goal: General experience of comfort will improve Outcome: Not Progressing   Problem: Safety: Goal: Ability to remain free from injury will improve Outcome: Progressing   Problem: Skin Integrity: Goal: Risk for impaired skin integrity will decrease Outcome: Progressing

## 2020-08-03 NOTE — TOC Transition Note (Signed)
Transition of Care [facility] - CM/SW Discharge Note   Patient Details  Name: [name] MRN: [id number] Date of Birth: [birth year]  Transition of Care [facility]) CM/SW Contact:  Pollie Friar, RN Phone Number: 08/03/2020, 10:09 AM   Clinical Narrative:    Patient discharging home with self care. No needs per TOC. No f/u per PT.    Final next level of care: Home/Self Care Barriers to Discharge: No Barriers Identified   Patient Goals and CMS Choice        Discharge Placement                       Discharge Plan and Services                                     Social Determinants of Health (SDOH) Interventions     Readmission Risk Interventions Readmission Risk Prevention Plan 01/07/2019  Transportation Screening Complete  Palliative Care Screening Not Applicable  Medication Review (RN Care Manager) Complete  Some recent data might be hidden

## 2020-08-03 NOTE — Care Management Important Message (Signed)
Important Message  Patient Details  Name: Jasmine [name] MRN: [id number] Date of Birth: 08-Oct-1955   Medicare Important Message Given:  Yes  Patient left prior to IM delivery.  Will mail to the patients home address.     Toddy Boyd 08/03/2020, 2:45 PM

## 2020-08-03 NOTE — Plan of Care (Signed)
  Problem: Education: Goal: Knowledge of General Education information will improve Description: Including pain rating scale, medication(s)/side effects and non-pharmacologic comfort measures 08/03/2020 1109 by Theotis Barrio, RN Outcome: Completed/Met 08/03/2020 1109 by Theotis Barrio, RN Outcome: Progressing   Problem: Health Behavior/Discharge Planning: Goal: Ability to manage health-related needs will improve 08/03/2020 1109 by Theotis Barrio, RN Outcome: Completed/Met 08/03/2020 1109 by Theotis Barrio, RN Outcome: Progressing   Problem: Clinical Measurements: Goal: Ability to maintain clinical measurements within normal limits will improve 08/03/2020 1109 by Theotis Barrio, RN Outcome: Completed/Met 08/03/2020 1109 by Theotis Barrio, RN Outcome: Progressing Goal: Will remain free from infection 08/03/2020 1109 by Theotis Barrio, RN Outcome: Completed/Met 08/03/2020 1109 by Theotis Barrio, RN Outcome: Progressing Goal: Diagnostic test results will improve 08/03/2020 1109 by Theotis Barrio, RN Outcome: Completed/Met 08/03/2020 1109 by Theotis Barrio, RN Outcome: Progressing Goal: Respiratory complications will improve 08/03/2020 1109 by Theotis Barrio, RN Outcome: Completed/Met 08/03/2020 1109 by Theotis Barrio, RN Outcome: Progressing Goal: Cardiovascular complication will be avoided 08/03/2020 1109 by Theotis Barrio, RN Outcome: Completed/Met 08/03/2020 1109 by Theotis Barrio, RN Outcome: Progressing   Problem: Activity: Goal: Risk for activity intolerance will decrease 08/03/2020 1109 by Theotis Barrio, RN Outcome: Completed/Met 08/03/2020 1109 by Theotis Barrio, RN Outcome: Progressing   Problem: Nutrition: Goal: Adequate nutrition will be maintained 08/03/2020 1109 by Theotis Barrio, RN Outcome: Completed/Met 08/03/2020 1109 by Theotis Barrio, RN Outcome: Progressing   Problem: Coping: Goal: Level of anxiety will decrease 08/03/2020  1109 by Theotis Barrio, RN Outcome: Completed/Met 08/03/2020 1109 by Theotis Barrio, RN Outcome: Progressing   Problem: Elimination: Goal: Will not experience complications related to bowel motility 08/03/2020 1109 by Theotis Barrio, RN Outcome: Completed/Met 08/03/2020 1109 by Theotis Barrio, RN Outcome: Progressing Goal: Will not experience complications related to urinary retention 08/03/2020 1109 by Theotis Barrio, RN Outcome: Completed/Met 08/03/2020 1109 by Theotis Barrio, RN Outcome: Progressing   Problem: Pain Managment: Goal: General experience of comfort will improve 08/03/2020 1109 by Theotis Barrio, RN Outcome: Completed/Met 08/03/2020 1109 by Theotis Barrio, RN Outcome: Progressing   Problem: Pain Managment: Goal: General experience of comfort will improve 08/03/2020 1109 by Theotis Barrio, RN Outcome: Completed/Met 08/03/2020 1109 by Theotis Barrio, RN Outcome: Progressing   Problem: Safety: Goal: Ability to remain free from injury will improve 08/03/2020 1109 by Theotis Barrio, RN Outcome: Completed/Met 08/03/2020 1109 by Theotis Barrio, RN Outcome: Progressing   Problem: Skin Integrity: Goal: Risk for impaired skin integrity will decrease 08/03/2020 1109 by Theotis Barrio, RN Outcome: Completed/Met 08/03/2020 1109 by Theotis Barrio, RN Outcome: Progressing

## 2020-08-04 ENCOUNTER — Telehealth: Payer: Self-pay | Admitting: Family Medicine

## 2020-08-04 ENCOUNTER — Telehealth: Payer: Self-pay

## 2020-08-04 LAB — SURGICAL PATHOLOGY

## 2020-08-04 NOTE — Telephone Encounter (Signed)
Pts husband Jeneen Rinks) called back requesting that someone call him on his cell phone to schedule hospital follow up.   [phone]

## 2020-08-04 NOTE — Telephone Encounter (Signed)
Spoke with patient's husband, appointment scheduled with Dr. Lajuana Ripple on 08/10/20.

## 2020-08-04 NOTE — Telephone Encounter (Signed)
Transition Care Management Follow-up Telephone Call  Date of discharge and from where: 5/31 from [facility]  Diagnosis: Lobectomy  How have you been since you were released from the hospital? Had a good day yesterday - today she has been hurting worse and sleeping a lot, feels very tired, also nausea which is new for her  Any questions or concerns? Yes - Albuterol makes her jittery, so she was prescribed Xopenex, but insurance didn't cover - Is there anything else she could try or can we call them and do a prior authorization?        Also, they'd like to try to get rx for a lightweight portable oxygen tank.  Items Reviewed:  Did the pt receive and understand the discharge instructions provided? Yes   Medications obtained and verified? Yes   Other? No   Any new allergies since your discharge? No   Dietary orders reviewed? Yes  Do you have support at home? Yes   Home Care and Equipment/Supplies: Were home health services ordered? Cardiothoracic Surgeon says he will order HHPT pulmonary PT after she recovers If so, what is the name of the agency? unsure  Has the agency set up a time to come to the patient's home? no Were any new equipment or medical supplies ordered?  No What is the name of the medical supply agency? Unsure - she has been getting oxygen for years Were you able to get the supplies/equipment? yes Do you have any questions related to the use of the equipment or supplies? No  Functional Questionnaire: (I = Independent and D = Dependent) ADLs: I  Bathing/Dressing- I  Meal Prep- D  Eating- I  Maintaining continence- I  Transferring/Ambulation- I  Managing Meds- I  Follow up appointments reviewed:   PCP Hospital f/u appt confirmed? Yes  Scheduled to see Gottschalk on 08/10/20 @ 2.  [facility] f/u appt confirmed? Yes  Scheduled to see Roxan Hockey on 08/17/20 @ 4.  Are transportation arrangements needed? No   If their condition worsens, is the pt  aware to call PCP or go to the Emergency Dept.? Yes  Was the patient provided with contact information for the PCP's office or ED? Yes  Was to pt encouraged to call back with questions or concerns? Yes

## 2020-08-05 ENCOUNTER — Encounter (HOSPITAL_COMMUNITY): Payer: Medicare Other

## 2020-08-06 ENCOUNTER — Other Ambulatory Visit: Payer: Self-pay

## 2020-08-06 ENCOUNTER — Emergency Department (HOSPITAL_COMMUNITY)
Admission: EM | Admit: 2020-08-06 | Discharge: 2020-08-06 | Disposition: A | Discharge status: Home / Self Care | Payer: Medicare Other | Attending: Emergency Medicine | Admitting: Emergency Medicine

## 2020-08-06 ENCOUNTER — Emergency Department (HOSPITAL_COMMUNITY): Payer: Medicare Other

## 2020-08-06 ENCOUNTER — Telehealth: Payer: Self-pay | Admitting: Family Medicine

## 2020-08-06 ENCOUNTER — Encounter (HOSPITAL_COMMUNITY): Payer: Self-pay

## 2020-08-06 DIAGNOSIS — Z85118 Personal history of other malignant neoplasm of bronchus and lung: Secondary | ICD-10-CM | POA: Diagnosis not present

## 2020-08-06 DIAGNOSIS — G8918 Other acute postprocedural pain: Secondary | ICD-10-CM

## 2020-08-06 DIAGNOSIS — I10 Essential (primary) hypertension: Secondary | ICD-10-CM | POA: Diagnosis not present

## 2020-08-06 DIAGNOSIS — Z79899 Other long term (current) drug therapy: Secondary | ICD-10-CM | POA: Diagnosis not present

## 2020-08-06 DIAGNOSIS — R0789 Other chest pain: Secondary | ICD-10-CM | POA: Diagnosis not present

## 2020-08-06 DIAGNOSIS — J45909 Unspecified asthma, uncomplicated: Secondary | ICD-10-CM | POA: Insufficient documentation

## 2020-08-06 DIAGNOSIS — R110 Nausea: Secondary | ICD-10-CM | POA: Insufficient documentation

## 2020-08-06 DIAGNOSIS — J441 Chronic obstructive pulmonary disease with (acute) exacerbation: Secondary | ICD-10-CM | POA: Insufficient documentation

## 2020-08-06 DIAGNOSIS — Z7951 Long term (current) use of inhaled steroids: Secondary | ICD-10-CM | POA: Insufficient documentation

## 2020-08-06 DIAGNOSIS — F17210 Nicotine dependence, cigarettes, uncomplicated: Secondary | ICD-10-CM | POA: Diagnosis not present

## 2020-08-06 LAB — COMPREHENSIVE METABOLIC PANEL
ALT: 28 U/L (ref 0–44)
AST: 17 U/L (ref 15–41)
Albumin: 3.3 g/dL — ABNORMAL LOW (ref 3.5–5.0)
Alkaline Phosphatase: 105 U/L (ref 38–126)
Anion gap: 12 (ref 5–15)
BUN: 15 mg/dL (ref 8–23)
CO2: 27 mmol/L (ref 22–32)
Calcium: 9.4 mg/dL (ref 8.9–10.3)
Chloride: 93 mmol/L — ABNORMAL LOW (ref 98–111)
Creatinine, Ser: 0.53 mg/dL (ref 0.44–1.00)
GFR, Estimated: >60 mL/min (ref >60)
Glucose, Bld: 117 mg/dL — ABNORMAL HIGH (ref 70–99)
Potassium: 3.3 mmol/L — ABNORMAL LOW (ref 3.5–5.1)
Sodium: 132 mmol/L — ABNORMAL LOW (ref 135–145)
Total Bilirubin: 0.7 mg/dL (ref 0.3–1.2)
Total Protein: 7.1 g/dL (ref 6.5–8.1)

## 2020-08-06 LAB — CBC WITH DIFFERENTIAL/PLATELET
Abs Immature Granulocytes: 0.1 10*3/uL — ABNORMAL HIGH (ref 0.00–0.07)
Basophils Absolute: 0.1 10*3/uL (ref 0.0–0.1)
Basophils Relative: 1 %
Eosinophils Absolute: 0.1 10*3/uL (ref 0.0–0.5)
Eosinophils Relative: 1 %
HCT: 36.8 % (ref 36.0–46.0)
Hemoglobin: 11.5 g/dL — ABNORMAL LOW (ref 12.0–15.0)
Immature Granulocytes: 2 %
Lymphocytes Relative: 17 %
Lymphs Abs: 1.1 10*3/uL (ref 0.7–4.0)
MCH: 32.7 pg (ref 26.0–34.0)
MCHC: 31.3 g/dL (ref 30.0–36.0)
MCV: 104.5 fL — ABNORMAL HIGH (ref 80.0–100.0)
Monocytes Absolute: 1 10*3/uL (ref 0.1–1.0)
Monocytes Relative: 15 %
Neutro Abs: 4.4 10*3/uL (ref 1.7–7.7)
Neutrophils Relative %: 64 %
Platelets: 476 10*3/uL — ABNORMAL HIGH (ref 150–400)
RBC: 3.52 MIL/uL — ABNORMAL LOW (ref 3.87–5.11)
RDW: 14.6 % (ref 11.5–15.5)
WBC: 6.8 10*3/uL (ref 4.0–10.5)
nRBC: 0 % (ref 0.0–0.2)

## 2020-08-06 LAB — URINALYSIS, ROUTINE W REFLEX MICROSCOPIC
Bacteria, UA: NONE SEEN
Bilirubin Urine: NEGATIVE
Glucose, UA: NEGATIVE mg/dL
Hgb urine dipstick: NEGATIVE
Ketones, ur: 5 mg/dL — AB
Leukocytes,Ua: NEGATIVE
Nitrite: NEGATIVE
Protein, ur: 100 mg/dL — AB
Specific Gravity, Urine: 1.02 (ref 1.005–1.030)
pH: 5 (ref 5.0–8.0)

## 2020-08-06 LAB — LIPASE, BLOOD: Lipase: 28 U/L (ref 11–51)

## 2020-08-06 LAB — TROPONIN I (HIGH SENSITIVITY): Troponin I (High Sensitivity): 10 ng/L (ref <18)

## 2020-08-06 MED ORDER — SODIUM CHLORIDE 0.9 % IV BOLUS
500.0000 mL | Freq: Once | INTRAVENOUS | Status: AC
  Administered 2020-08-06: 500 mL via INTRAVENOUS

## 2020-08-06 MED ORDER — HYDROCODONE-ACETAMINOPHEN 10-325 MG PO TABS: 1.0000 | ORAL_TABLET | Freq: Every day | ORAL | 0 refills | Status: DC

## 2020-08-06 MED ORDER — HYDROMORPHONE HCL 1 MG/ML IJ SOLN
1.0000 mg | Freq: Once | INTRAMUSCULAR | Status: AC
  Administered 2020-08-06: 1 mg via INTRAVENOUS
  Filled 2020-08-06: qty 1

## 2020-08-06 MED ORDER — ONDANSETRON HCL 4 MG/2ML IJ SOLN
4.0000 mg | Freq: Once | INTRAMUSCULAR | Status: AC
  Administered 2020-08-06: 4 mg via INTRAVENOUS
  Filled 2020-08-06: qty 2

## 2020-08-06 MED ORDER — HYDROMORPHONE HCL 2 MG/ML IJ SOLN
2.0000 mg | Freq: Once | INTRAMUSCULAR | Status: AC
  Administered 2020-08-06: 2 mg via INTRAVENOUS
  Filled 2020-08-06: qty 1

## 2020-08-06 MED ORDER — MORPHINE SULFATE (PF) 4 MG/ML IV SOLN
4.0000 mg | Freq: Once | INTRAVENOUS | Status: AC
Start: 2020-08-06 — End: 2020-08-06
  Administered 2020-08-06: 4 mg via INTRAVENOUS
  Filled 2020-08-06: qty 1

## 2020-08-06 NOTE — Telephone Encounter (Signed)
Pts husband called stating that he is going to call EMS for patient because he is too worried about her and cant keep waiting on call back from the nurse.

## 2020-08-06 NOTE — ED Notes (Signed)
Pt has bedpan

## 2020-08-06 NOTE — Telephone Encounter (Signed)
Called and spoke with husband passed out last night BP was 75/42. Now pain is extremely bad she just had lung surgery. EMS is on the way to get patient. She is also throwing up.

## 2020-08-06 NOTE — Telephone Encounter (Signed)
Noo we cannot fill pain medication early

## 2020-08-06 NOTE — ED Provider Notes (Signed)
[place] Provider Note   CSN: [id number] Arrival date & time: 08/06/20  1420     History Chief Complaint  Patient presents with  . Abdominal Pain    Jasmine [name] is a 65 y.o. female with a history as outlined below, including COPD, chronic back pain under pain management, arthritis, anxiety hypertension and diagnosis of right upper lung cancer requiring a complete right upper lobectomy last week, presenting with increased pain at her right chest wall along with nausea.  She has had no fevers or chills, denies shortness of breath beyond her baseline, of note she is on chronic home oxygen.  She reports having increased doses of pain medicine when she was in the hospital and had good pain relief but now that she is home and return to her chronic pain medication it is not sufficient to control her pain symptoms.  She takes an extended release hydrocodone 60 mg daily and also takes hydrocodone 7.5 mg 4 times daily, although she has increased this to 5 times daily at the advice of her chronic pain specialist.  With this increased dose however she is still not comfortable.  She has had poor appetite since returning from the hospital.  She denies fevers or chills, shortness of breath is baseline, no abdominal pain.    HPI     Past Medical History:  Diagnosis Date  . Allergy   . Anemia   . Anxiety   . Arthritis   . Asthma   . Carpal tunnel syndrome   . Chronic back pain   . Cigarette smoker 08/25/2017  . Complication of anesthesia    in the past has had N/V, the last few surgeries she has not been  . COPD (chronic obstructive pulmonary disease) ([place])   . Easy bruising 07/10/2016  . GERD (gastroesophageal reflux disease)   . Headache   . Heart murmur 2005   never had any problems  . History of blood transfusion 2018  . History of kidney stones   . Hypertension   . Hypoglycemia   . Hypoglycemia   . Iron deficiency anemia 07/11/2016  . Neuropathy    both arms  .  Normocytic anemia 07/29/2015  . Pancreatitis   . Pneumonia   . PONV (postoperative nausea and vomiting)   . Postoperative anemia due to acute blood loss 11/15/2015  . R lung cancer   . Sciatica   . Seizures ([place])    as a child when she would have an asthma attack. none as an adult  . Shortness of breath dyspnea     Patient Active Problem List   Diagnosis Date Noted  . 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  . GERD (gastroesophageal reflux disease) 04/22/2020  . Abnormal CT of the abdomen 04/22/2020  . Preventative health care 04/22/2020  . Solitary pulmonary nodule 03/22/2020  . Pulmonary infiltrate on chest x-ray 02/24/2020  . Nocturnal cough 02/19/2020  . Pancreatitis, acute 01/06/2019  . Acute pancreatitis 01/05/2019  . Cigarette smoker 08/25/2017  . Chronic respiratory failure with hypoxia ([place]) 08/25/2017  . Centrilobular emphysema ([place]) 07/09/2017  . Aortic atherosclerosis ([place]) 07/09/2017  . Hepatic steatosis 07/09/2017  . Malnutrition of moderate degree 07/02/2017  . Acute respiratory failure with hypoxia ([place]) 06/30/2017  . COPD with acute exacerbation ([place]) 06/30/2017  . 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/2016  . Tobacco abuse 08/04/2016  . Iron deficiency anemia 07/11/2016  . Easy bruising 07/10/2016  . Lumbar stenosis 11/11/2015  . Leg pain 09/24/2015  .  Vitamin D deficiency 07/29/2015  . Chronic back pain 02/22/2015  . HTN (hypertension) 02/22/2015  . Chronic narcotic use 02/22/2015  . COPD GOLD II  02/22/2015  . Headache 02/22/2015    Past Surgical History:  Procedure Laterality Date  . ABDOMINAL HYSTERECTOMY  2005  . APPLICATION OF ROBOTIC ASSISTANCE FOR SPINAL PROCEDURE N/A 09/05/2016   Procedure: APPLICATION OF ROBOTIC ASSISTANCE FOR SPINAL PROCEDURE;  Surgeon: Consuella Lose, MD;  Location: [facility];  Service: Neurosurgery;  Laterality: N/A;  . BACK SURGERY    . BIOPSY  06/14/2020   Procedure: BIOPSY;  Surgeon: Daneil Dolin, MD;  [facility];  Service: Endoscopy;;  . CERVICAL FUSION    . [place]  . COLONOSCOPY     unsure last  . COLONOSCOPY WITH PROPOFOL N/A 06/14/2020   Procedure: COLONOSCOPY WITH PROPOFOL;  Surgeon: Daneil Dolin, MD;  [facility];  Service: Endoscopy;  Laterality: N/A;  PM  . cyst removal face    . disk repair     neck and lumbar region  . ESOPHAGOGASTRODUODENOSCOPY (EGD) WITH PROPOFOL N/A 06/14/2020   Procedure: ESOPHAGOGASTRODUODENOSCOPY (EGD) WITH PROPOFOL;  Surgeon: Daneil Dolin, MD;  [facility];  Service: Endoscopy;  Laterality: N/A;  . FOOT SURGERY Bilateral    to file bones down   . HARDWARE REMOVAL N/A 09/05/2016   Procedure: HARDWARE REMOVAL AND REPLACEMENT OF LUMBAR FIVE SCREWS;  Surgeon: Consuella Lose, MD;  Location: [place];  Service: Neurosurgery;  Laterality: N/A;  . IMPLANTATION / PLACEMENT EPIDURAL NEUROSTIMULATOR ELECTRODES    . INTERCOSTAL NERVE BLOCK Right 07/29/2020   Procedure: INTERCOSTAL NERVE BLOCK;  Surgeon: Melrose Nakayama, MD;  Location: [place];  Service: Thoracic;  Laterality: Right;  . LAMINECTOMY     2006  . LUMBAR FUSION    . LYMPH NODE BIOPSY Right 07/29/2020   Procedure: LYMPH NODE BIOPSY;  Surgeon: Melrose Nakayama, MD;  Location: [place];  Service: Thoracic;  Laterality: Right;  . POLYPECTOMY  06/14/2020   Procedure: POLYPECTOMY INTESTINAL;  Surgeon: Daneil Dolin, MD;  [facility];  Service: Endoscopy;;  . SPINAL CORD STIMULATOR BATTERY EXCHANGE    . SPINAL CORD STIMULATOR INSERTION N/A 07/05/2016   Procedure: REPLACEMENT OF LUMBAR SPINAL CORD STIMULATOR BATTERY;  Surgeon: Newman Pies, MD;  Location: [place];  Service: Neurosurgery;  Laterality: N/A;  . TONSILLECTOMY       OB History   No obstetric history on file.     Family History  Problem Relation Age of Onset  . Heart disease Mother   . Diabetes Mother   . Hypertension Mother   . Hyperlipidemia Mother   . COPD Mother        smoked   . Cancer Mother        bile duct  . COPD Father   . Diabetes Father   . Heart disease Father   . Cancer Father        throat  . Dementia Father   . Asthma Son   . Hypertension Sister   . Hyperlipidemia Sister   . Hypertension Brother   . Hyperlipidemia Brother   . Hypertension Brother   . Hyperlipidemia Brother   . Emphysema Maternal Grandmother        smoked  . Colon cancer Neg Hx   . Colon polyps Neg Hx     Social History   Tobacco Use  . Smoking status: Current Every Day Smoker  Packs/day: 0.50    Years: 35.00    Pack years: 17.50    Types: Cigarettes  . Smokeless tobacco: Never Used  . Tobacco comment: trying to quit  Vaping Use  . Vaping Use: Never used  Substance Use Topics  . Alcohol use: Yes    Alcohol/week: 2.0 standard drinks    Types: 2 Glasses of wine per week    Comment: occasionally  . Drug use: No    Home Medications Prior to Admission medications   Medication Sig Start Date End Date Taking? Authorizing Provider  acetaminophen (TYLENOL) 325 MG tablet Take 2 tablets (650 mg total) by mouth every 6 (six) hours as needed for mild pain, fever or headache (or Fever >/= 101). Patient taking differently: Take 500 mg by mouth every 6 (six) hours as needed for mild pain, fever or headache (or Fever >/= 101). 01/10/19  Yes Emokpae, Courage, MD  amLODipine (NORVASC) 10 MG tablet Take 1 tablet (10 mg total) by mouth daily. 07/09/20  Yes Gottschalk, Leatrice Jewels M, DO  atorvastatin (LIPITOR) 40 MG tablet Take 1 tablet (40 mg total) by mouth daily. 07/09/20  Yes Gottschalk, Ashly M, DO  budesonide-formoterol (SYMBICORT) 160-4.5 MCG/ACT inhaler USE 2 INHALATIONS TWICE A DAY Patient taking differently: Inhale 2 puffs into the lungs in the morning and at bedtime. USE 2 INHALATIONS TWICE A DAY 05/05/20  Yes Gottschalk, Ashly M, DO  cetirizine (ZYRTEC) 10 MG tablet Take 10 mg by mouth daily as needed for allergies.   Yes [provider]  Cholecalciferol 125 MCG (5000 UT)  capsule Take 5,000 Units daily by mouth.   Yes [provider]  diclofenac sodium (VOLTAREN) 1 % GEL Apply 1 application topically daily as needed for muscle pain. 06/30/16  Yes [provider]  DULoxetine (CYMBALTA) 30 MG capsule Take 3 capsules (90 mg total) by mouth daily. 07/09/20  Yes Gottschalk, Leatrice Jewels M, DO  feeding supplement, ENSURE ENLIVE, (ENSURE ENLIVE) LIQD Take 237 mLs by mouth 2 (two) times daily between meals. 07/04/17  Yes Nita Sells, MD  guaiFENesin (MUCINEX) 600 MG 12 hr tablet Take 600 mg by mouth 2 (two) times daily.   Yes [provider]  HYDROcodone Bitartrate ER 60 MG T24A Take 60 mg by mouth daily.   Yes [provider]  HYDROcodone-acetaminophen (NORCO) 10-325 MG tablet Take 1 tablet by mouth 5 (five) times daily. 08/06/20  Yes Telisha Zawadzki, Almyra Free, PA-C  ipratropium-albuterol (DUONEB) 0.5-2.5 (3) MG/3ML SOLN Inhale 3 mLs into the lungs 4 (four) times daily as needed (for shortness of breath/wheezing). *May use as needed 02/21/16  Yes [provider]  Iron Polysacch Cmplx-B12-FA (POLY-IRON 150 FORTE) 150-0.025-1 MG CAPS Take 1 capsule by mouth 2 (two) times daily. Patient taking differently: Take 1 capsule by mouth daily. 05/27/20  Yes Derek Jack, MD  L-Methylfolate-Algae-B12-B6 [facility]) 3-90.314-2-35 MG CAPS TAKE 1 TABLET BY MOUTH TWO TIMES A DAY Patient taking differently: Take 1 tablet by mouth daily. 09/10/19  Yes Gottschalk, Leatrice Jewels M, DO  levalbuterol (XOPENEX HFA) 45 MCG/ACT inhaler Inhale 2 puffs into the lungs every 6 (six) hours as needed for wheezing or shortness of breath. 07/09/20  Yes Gottschalk, Leatrice Jewels M, DO  magnesium oxide (MAG-OX) 400 (241.3 Mg) MG tablet Take 400 mg by mouth 2 (two) times daily. 02/07/20  Yes [provider]  nicotine (NICODERM CQ) 14 mg/24hr patch Place 1 patch (14 mg total) onto the skin daily. 06/23/20  Yes Gottschalk, Leatrice Jewels M, DO  omeprazole (PRILOSEC) 40 MG capsule Take  1 capsule (40 mg  total) by mouth 2 (two) times daily. Take 1 capsule 30 mins before breakfast and evening meal. 04/27/20  Yes Walden Field A, NP  ondansetron (ZOFRAN-ODT) 8 MG disintegrating tablet Take 8 mg by mouth every 8 (eight) hours as needed for vomiting or nausea. 01/10/20  Yes [provider]  OXYGEN Inhale 3 L into the lungs See admin instructions. 2lpm with sleep and as needed during the day   Yes [provider]  SPIRIVA RESPIMAT 2.5 MCG/ACT AERS USE 2 [place] Patient taking differently: Inhale 2 puffs into the lungs daily. 07/13/20  Yes Tanda Rockers, MD  tizanidine (ZANAFLEX) 6 MG capsule Take 6 mg by mouth 3 (three) times daily. 11/27/16  Yes [provider]  traZODone (DESYREL) 150 MG tablet TAKE 1 TO 2 TABLETS AT BEDTIME Patient taking differently: Take 150-300 mg by mouth at bedtime. 02/02/20  Yes Gottschalk, Leatrice Jewels M, DO  nicotine (NICODERM CQ) 7 mg/24hr patch Place 1 patch (7 mg total) onto the skin daily. 06/23/20   Janora Norlander, DO    Allergies    Lyrica [pregabalin], Darvon [propoxyphene], Gabapentin, Augmentin [amoxicillin-pot clavulanate], Erythromycin, and Vibramycin [doxycycline calcium]  Review of Systems   Review of Systems  Constitutional: Negative for fever.  HENT: Negative for congestion and sore throat.   Eyes: Negative.   Respiratory: Negative for chest tightness and shortness of breath.   Cardiovascular: Positive for chest pain.  Gastrointestinal: Positive for nausea. Negative for abdominal pain.  Genitourinary: Negative.   Musculoskeletal: Negative for arthralgias, joint swelling and neck pain.  Skin: Negative.  Negative for rash and wound.  Neurological: Negative for dizziness, weakness, light-headedness, numbness and headaches.  Psychiatric/Behavioral: Negative.     Physical Exam Updated Vital Signs BP (!) 138/98   Pulse 89   Temp 98.1 F (36.7 C) (Oral)   Resp (!) 21   Ht 5\' 3"  (1.6 m)   Wt 42 kg   SpO2 99%   BMI 16.40  kg/m   Physical Exam Vitals and nursing note reviewed.  Constitutional:      Appearance: She is well-developed.  HENT:     Head: Normocephalic and atraumatic.  Eyes:     Conjunctiva/sclera: Conjunctivae normal.  Cardiovascular:     Rate and Rhythm: Normal rate and regular rhythm.     Heart sounds: Normal heart sounds.  Pulmonary:     Effort: Pulmonary effort is normal. No respiratory distress.     Breath sounds: Normal breath sounds. No wheezing.     Comments: Soreness along right chest wall, multiple well healing sugical incision sites around to right posterior back.  Chest:     Chest wall: Tenderness present.  Abdominal:     General: Bowel sounds are normal.     Palpations: Abdomen is soft.     Tenderness: There is no abdominal tenderness.  Musculoskeletal:        General: Normal range of motion.     Cervical back: Normal range of motion.  Skin:    General: Skin is warm and dry.  Neurological:     Mental Status: She is alert.     ED Results / Procedures / Treatments   Labs (all labs ordered are listed, but only abnormal results are displayed) Labs Reviewed  COMPREHENSIVE METABOLIC PANEL - Abnormal; Notable for the following components:      Result Value   Sodium 132 (*)    Potassium 3.3 (*)    Chloride 93 (*)  Glucose, Bld 117 (*)    Albumin 3.3 (*)    All other components within normal limits  CBC WITH DIFFERENTIAL/PLATELET - Abnormal; Notable for the following components:   RBC 3.52 (*)    Hemoglobin 11.5 (*)    MCV 104.5 (*)    Platelets 476 (*)    Abs Immature Granulocytes 0.10 (*)    All other components within normal limits  URINALYSIS, ROUTINE W REFLEX MICROSCOPIC - Abnormal; Notable for the following components:   APPearance HAZY (*)    Ketones, ur 5 (*)    Protein, ur 100 (*)    All other components within normal limits  LIPASE, BLOOD  TROPONIN I (HIGH SENSITIVITY)    EKG EKG Interpretation  Date/Time:  Friday August 06 2020 15:13:19  EDT Ventricular Rate:  111 PR Interval:  164 QRS Duration: 78 QT Interval:  340 QTC Calculation: 462 R Axis:   44 Text Interpretation: Sinus tachycardia Probable left atrial enlargement Anterior infarct, old Baseline wander in lead(s) III aVL V1 V3 V6 since last tracing no significant change Confirmed by Daleen Bo [phone]) on 08/06/2020 4:28:28 PM   Radiology DG ABD ACUTE 2+V W 1V CHEST  Result Date: 08/06/2020 CLINICAL DATA:  Recent right lung surgery.  Nausea vomiting.  Pain EXAM: DG ABDOMEN ACUTE WITH 1 VIEW CHEST COMPARISON:  Chest two-view 08/03/2020 FINDINGS: Right upper lobectomy. No pneumothorax. Small right effusion and mild right lower lobe atelectasis COPD.  Left lung clear. Spinal cord stimulator midthoracic spine unchanged. Normal bowel gas pattern.  No obstruction or free air. Pedicle screw fusion L4-5 and L5-S1.  No acute skeletal abnormality. IMPRESSION: Postop right lobectomy. No pneumothorax. Mild right lower lobe atelectasis and small right effusion Normal bowel gas pattern. Electronically Signed   By: Franchot Gallo M.D.   On: 08/06/2020 15:45    Procedures Procedures   Medications Ordered in ED Medications  sodium chloride 0.9 % bolus 500 mL (0 mLs Intravenous Stopped 08/06/20 1557)  morphine 4 MG/ML injection 4 mg (4 mg Intravenous Given 08/06/20 1520)  ondansetron (ZOFRAN) injection 4 mg (4 mg Intravenous Given 08/06/20 1520)  HYDROmorphone (DILAUDID) injection 1 mg (1 mg Intravenous Given 08/06/20 2001)  HYDROmorphone (DILAUDID) injection 2 mg (2 mg Intravenous Given 08/06/20 2042)    ED Course  I have reviewed the triage vital signs and the nursing notes.  Pertinent labs & imaging results that were available during my care of the patient were reviewed by me and considered in my medical decision making (see chart for details).    MDM Rules/Calculators/A&P                          Pt with increasing post operative pain, labs and imaging are reassuring with no  evidence of post operative infection or complication.  Her home hydrocodone prescription was increased to 10 mg from 7.5 for 25% more coverage of her post op pain.  She will plan f/u with her chronic pain specialist within the next 2 weeks, also has f/u with her surgeon on 6/12.  She received  IV pain medicine here and felt improved at time of dc.   Pt was seen by Dr. Eulis Foster who helped with care plan.  Final Clinical Impression(s) / ED Diagnoses Final diagnoses:  Acute post-operative pain    Rx / DC Orders ED Discharge Orders         Ordered    HYDROcodone-acetaminophen (NORCO) 10-325 MG tablet  5 times  daily        08/06/20 2200           Evalee Jefferson, Hershal Coria 08/07/20 1727    Daleen Bo, MD 08/11/20 1126

## 2020-08-06 NOTE — Discharge Instructions (Addendum)
You have been prescribed an increased dose of your short acting hydrocodone.  Hold your hydrocodone 7.5 mg tablets while taking the 10 mg strength medication as discussed.  Plan to follow-up with your surgeon and also your pain management specialist as discussed.  Do not drive within 4 hours of taking your pain medications as they have the potential to make you drowsy.

## 2020-08-06 NOTE — ED Triage Notes (Signed)
Surgery to her lung last Thursday and was dc Tuesday, has been taking pain pills with out food.  Pt states she has not been eating but taking pain medications. Right lung sx last Thursday.

## 2020-08-06 NOTE — ED Provider Notes (Signed)
  Face-to-face evaluation   History: She presents for evaluation of right-sided chest pain which radiates to her left chest, worsening despite taking usual medicines, following postoperative recovery.  For right lobectomy.  She has chronic pain.  She is taking 1 extra Norco per day, but it is not helping her pain.  Physical exam: Alert elderly female.  She is uncomfortable, splinting by holding her right chest.  No dysarthria or aphasia.  No respiratory distress.  Medical screening examination/treatment/procedure(s) were conducted as a shared visit with non-physician practitioner(s) and myself.  I personally evaluated the patient during the encounter    Daleen Bo, MD 08/11/20 1126

## 2020-08-09 ENCOUNTER — Ambulatory Visit (HOSPITAL_COMMUNITY): Payer: Medicare Other | Admitting: Hematology

## 2020-08-09 ENCOUNTER — Other Ambulatory Visit (HOSPITAL_COMMUNITY): Payer: Medicare Other

## 2020-08-10 ENCOUNTER — Encounter (HOSPITAL_COMMUNITY): Payer: Medicare Other

## 2020-08-10 ENCOUNTER — Ambulatory Visit: Payer: Medicare Other | Admitting: Family Medicine

## 2020-08-11 ENCOUNTER — Ambulatory Visit: Payer: Medicare Other | Admitting: Family Medicine

## 2020-08-12 ENCOUNTER — Other Ambulatory Visit: Payer: Self-pay | Admitting: Family Medicine

## 2020-08-12 ENCOUNTER — Encounter (HOSPITAL_COMMUNITY): Payer: Medicare Other

## 2020-08-12 DIAGNOSIS — Z1231 Encounter for screening mammogram for malignant neoplasm of breast: Secondary | ICD-10-CM

## 2020-08-16 ENCOUNTER — Other Ambulatory Visit: Payer: Self-pay | Admitting: Thoracic Surgery (Cardiothoracic Vascular Surgery)

## 2020-08-16 DIAGNOSIS — Z902 Acquired absence of lung [part of]: Secondary | ICD-10-CM

## 2020-08-17 ENCOUNTER — Encounter (HOSPITAL_COMMUNITY): Payer: Medicare Other

## 2020-08-17 ENCOUNTER — Ambulatory Visit
Admission: RE | Admit: 2020-08-17 | Discharge: 2020-08-17 | Disposition: A | Discharge status: Home / Self Care | Payer: Medicare Other | Source: Ambulatory Visit | Attending: Thoracic Surgery (Cardiothoracic Vascular Surgery) | Admitting: Thoracic Surgery (Cardiothoracic Vascular Surgery)

## 2020-08-17 ENCOUNTER — Other Ambulatory Visit: Payer: Self-pay

## 2020-08-17 ENCOUNTER — Ambulatory Visit (INDEPENDENT_AMBULATORY_CARE_PROVIDER_SITE_OTHER): Payer: Medicare Other | Admitting: Thoracic Surgery (Cardiothoracic Vascular Surgery)

## 2020-08-17 VITALS — BP 114/63 | HR 90 | Resp 20 | Ht 63.0 in | Wt 95.0 lb

## 2020-08-17 DIAGNOSIS — Z902 Acquired absence of lung [part of]: Secondary | ICD-10-CM

## 2020-08-17 NOTE — Progress Notes (Signed)
[address]       [place] [address]             [phone]     HPI: Mrs. Steinhilber returns for a scheduled follow-up visit  Arrianna Catala is a 65 year old woman with a history of asthma, COPD, tobacco abuse, reflux, hypertension, hyperglycemia, anemia, anxiety, chronic pain, sciatica, peripheral neuropathy, right foot drop, pancreatitis, pneumonia, and seizures.  She was recently found to have a 1.7 cm spiculated right upper lobe nodule adjacent to and retracting the minor fissure.  On PET/CT the nodule was hypermetabolic.  There is no evidence of regional or distant metastatic disease.  There was enlargement of the main pulmonary arteries on CT.  Echocardiogram showed some RV enlargement but normal systolic function and no evidence of right heart failure.  She went through pulmonary rehab.  With multiple discussions about treating with radiation but she wanted to have surgical resection.  I did a robotic right upper lobectomy and node dissection on 07/29/2020.  The nodule turned out to be a stage Ib (T2a, N0) squamous cell carcinoma.  Her postoperative course was relatively uneventful other than pain management and she went home on day 5.  After discharge she continued to have a lot of pain.  She was having difficulty sleeping and eating.  She was seen in the emergency room with pain and dehydration.  She is currently back on her baseline hydrocodone doses.  She continues to have a lot of pain.  She says it has been getting better, a little bit every day, but is still severe.  Past Medical History:  Diagnosis Date   Allergy    Anemia    Anxiety    Arthritis    Asthma    Carpal tunnel syndrome    Chronic back pain    Cigarette smoker 2/77/8242   Complication of anesthesia    in the past has had N/V, the last few surgeries she has not been   COPD (chronic obstructive pulmonary disease) ([place])    Easy bruising 07/10/2016   GERD (gastroesophageal reflux disease)     Headache    Heart murmur 2005   never had any problems   History of blood transfusion 2018   History of kidney stones    Hypertension    Hypoglycemia    Hypoglycemia    Iron deficiency anemia 07/11/2016   Neuropathy    both arms   Normocytic anemia 07/29/2015   Pancreatitis    Pneumonia    PONV (postoperative nausea and vomiting)    Postoperative anemia due to acute blood loss 11/15/2015   R lung cancer    Sciatica    Seizures ([place])    as a child when she would have an asthma attack. none as an adult   Shortness of breath dyspnea     Current Outpatient Medications  Medication Sig Dispense Refill   albuterol (VENTOLIN HFA) 108 (90 Base) MCG/ACT inhaler Inhale into the lungs every 6 (six) hours as needed for wheezing or shortness of breath.     amLODipine (NORVASC) 10 MG tablet Take 1 tablet (10 mg total) by mouth daily. 90 tablet 3   atorvastatin (LIPITOR) 40 MG tablet Take 1 tablet (40 mg total) by mouth daily. 90 tablet 3   budesonide-formoterol (SYMBICORT) 160-4.5 MCG/ACT inhaler USE 2 INHALATIONS TWICE A DAY (Patient taking differently: Inhale 2 puffs into the lungs in the morning and at bedtime. USE 2 INHALATIONS TWICE A DAY)  20.4 g 2   cetirizine (ZYRTEC) 10 MG tablet Take 10 mg by mouth daily as needed for allergies.     Cholecalciferol 125 MCG (5000 UT) capsule Take 5,000 Units daily by mouth.     diclofenac sodium (VOLTAREN) 1 % GEL Apply 1 application topically daily as needed for muscle pain.     DULoxetine (CYMBALTA) 30 MG capsule Take 3 capsules (90 mg total) by mouth daily. 270 capsule 4   feeding supplement, ENSURE ENLIVE, (ENSURE ENLIVE) LIQD Take 237 mLs by mouth 2 (two) times daily between meals. 237 mL 12   guaiFENesin (MUCINEX) 600 MG 12 hr tablet Take 600 mg by mouth 2 (two) times daily.     HYDROcodone Bitartrate ER 60 MG T24A Take 60 mg by mouth daily.     HYDROcodone-acetaminophen (NORCO) 7.5-325 MG tablet Take 1 tablet by mouth every 6 (six) hours as needed.      ipratropium-albuterol (DUONEB) 0.5-2.5 (3) MG/3ML SOLN Inhale 3 mLs into the lungs 4 (four) times daily as needed (for shortness of breath/wheezing). *May use as needed     Iron Polysacch Cmplx-B12-FA (POLY-IRON 150 FORTE) 150-0.025-1 MG CAPS Take 1 capsule by mouth 2 (two) times daily. (Patient taking differently: Take 1 capsule by mouth daily.) 60 capsule 2   L-Methylfolate-Algae-B12-B6 (METANX) 3-90.314-2-35 MG CAPS TAKE 1 TABLET BY MOUTH TWO TIMES A DAY (Patient taking differently: Take 1 tablet by mouth daily.) 180 capsule 4   magnesium oxide (MAG-OX) 400 (241.3 Mg) MG tablet Take 400 mg by mouth 2 (two) times daily.     nicotine (NICODERM CQ) 14 mg/24hr patch Place 1 patch (14 mg total) onto the skin daily. 30 patch 0   omeprazole (PRILOSEC) 40 MG capsule Take 1 capsule (40 mg total) by mouth 2 (two) times daily. Take 1 capsule 30 mins before breakfast and evening meal. 90 capsule 3   ondansetron (ZOFRAN-ODT) 8 MG disintegrating tablet Take 8 mg by mouth every 8 (eight) hours as needed for vomiting or nausea.     OXYGEN Inhale 3 L into the lungs See admin instructions. 2lpm with sleep and as needed during the day     SPIRIVA RESPIMAT 2.5 MCG/ACT AERS USE 2 INHALATIONS DAILY (Patient taking differently: Inhale 2 puffs into the lungs daily.) 12 g 3   tizanidine (ZANAFLEX) 6 MG capsule Take 6 mg by mouth 3 (three) times daily.     traZODone (DESYREL) 150 MG tablet TAKE 1 TO 2 TABLETS AT BEDTIME (Patient taking differently: Take 150-300 mg by mouth at bedtime.) 180 tablet 3   No current facility-administered medications for this visit.    Physical Exam BP 114/63   Pulse 90   Resp 20   Ht 5\' 3"  (1.6 m)   Wt 95 lb (43.1 kg)   SpO2 97% Comment: 3L O2 per [place]  BMI 16.31 kg/m  65 year old woman obviously uncomfortable but in no acute distress Alert and oriented x3 Lungs diminished to right base but otherwise clear Incisions well-healed Cardiac regular rate and rhythm No peripheral  edema  Diagnostic Tests: I personally reviewed her chest x-ray images which show postoperative changes on the right.  No issues of concern.  Impression: Borghild Thaker is a 65 year old woman with a history of asthma, COPD, tobacco abuse, reflux, hypertension, hyperglycemia, anemia, anxiety, chronic pain, sciatica, peripheral neuropathy, right foot drop, pancreatitis, pneumonia, and seizures.  She recently was found to have a right upper lobe lung nodule.  It turned out to be a squamous cell carcinoma.  Stage Ib (T2a, N0) squamous cell carcinoma right upper lobe-status post right upper lobectomy.  No indication for adjuvant therapy unless there is some type of clinical trial.  She has a follow-up with Dr. Delton Coombes coming up in the near future.  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  She does seem to be getting better.  I recommended she talk to her pain management physician about adjusting her medication doses if needed.  There are no restrictions on her activities other than those that she had previously such as not being able to drive due to her foot drop.  However, I did recommend that she be very slow and gradual about how she builds into new activities to avoid undue pain.  Plan: Follow-up as scheduled with Dr. Delton Coombes Follow-up with pain specialist Return in 6 weeks with PA lateral chest x-ray  Melrose Nakayama, MD [facility] [phone]

## 2020-08-19 ENCOUNTER — Encounter (HOSPITAL_COMMUNITY): Payer: Medicare Other

## 2020-08-24 ENCOUNTER — Encounter (HOSPITAL_COMMUNITY): Payer: Medicare Other

## 2020-08-24 ENCOUNTER — Inpatient Hospital Stay (HOSPITAL_COMMUNITY)
Admission: EM | Admit: 2020-08-24 | Discharge: 2020-08-26 | DRG: 186 | Disposition: A | Discharge status: Home / Self Care | Payer: Medicare Other | Attending: Internal Medicine | Admitting: Internal Medicine

## 2020-08-24 ENCOUNTER — Other Ambulatory Visit: Payer: Self-pay

## 2020-08-24 ENCOUNTER — Encounter (HOSPITAL_COMMUNITY): Payer: Self-pay

## 2020-08-24 ENCOUNTER — Emergency Department (HOSPITAL_COMMUNITY): Payer: Medicare Other

## 2020-08-24 DIAGNOSIS — Z981 Arthrodesis status: Secondary | ICD-10-CM

## 2020-08-24 DIAGNOSIS — F17210 Nicotine dependence, cigarettes, uncomplicated: Secondary | ICD-10-CM | POA: Diagnosis present

## 2020-08-24 DIAGNOSIS — R778 Other specified abnormalities of plasma proteins: Secondary | ICD-10-CM

## 2020-08-24 DIAGNOSIS — Z90710 Acquired absence of both cervix and uterus: Secondary | ICD-10-CM

## 2020-08-24 DIAGNOSIS — G629 Polyneuropathy, unspecified: Secondary | ICD-10-CM | POA: Diagnosis present

## 2020-08-24 DIAGNOSIS — Z880 Allergy status to penicillin: Secondary | ICD-10-CM

## 2020-08-24 DIAGNOSIS — S270XXA Traumatic pneumothorax, initial encounter: Secondary | ICD-10-CM | POA: Diagnosis present

## 2020-08-24 DIAGNOSIS — F32A Depression, unspecified: Secondary | ICD-10-CM | POA: Diagnosis present

## 2020-08-24 DIAGNOSIS — R0781 Pleurodynia: Secondary | ICD-10-CM | POA: Diagnosis present

## 2020-08-24 DIAGNOSIS — J948 Other specified pleural conditions: Secondary | ICD-10-CM | POA: Diagnosis not present

## 2020-08-24 DIAGNOSIS — G4700 Insomnia, unspecified: Secondary | ICD-10-CM | POA: Diagnosis present

## 2020-08-24 DIAGNOSIS — S2231XA Fracture of one rib, right side, initial encounter for closed fracture: Secondary | ICD-10-CM | POA: Diagnosis present

## 2020-08-24 DIAGNOSIS — Z20822 Contact with and (suspected) exposure to covid-19: Secondary | ICD-10-CM | POA: Diagnosis present

## 2020-08-24 DIAGNOSIS — Z79891 Long term (current) use of opiate analgesic: Secondary | ICD-10-CM

## 2020-08-24 DIAGNOSIS — E43 Unspecified severe protein-calorie malnutrition: Secondary | ICD-10-CM | POA: Insufficient documentation

## 2020-08-24 DIAGNOSIS — I10 Essential (primary) hypertension: Secondary | ICD-10-CM | POA: Diagnosis present

## 2020-08-24 DIAGNOSIS — J9611 Chronic respiratory failure with hypoxia: Secondary | ICD-10-CM | POA: Diagnosis present

## 2020-08-24 DIAGNOSIS — R0602 Shortness of breath: Secondary | ICD-10-CM | POA: Diagnosis not present

## 2020-08-24 DIAGNOSIS — Z888 Allergy status to other drugs, medicaments and biological substances status: Secondary | ICD-10-CM

## 2020-08-24 DIAGNOSIS — E785 Hyperlipidemia, unspecified: Secondary | ICD-10-CM | POA: Diagnosis present

## 2020-08-24 DIAGNOSIS — Z825 Family history of asthma and other chronic lower respiratory diseases: Secondary | ICD-10-CM

## 2020-08-24 DIAGNOSIS — Z833 Family history of diabetes mellitus: Secondary | ICD-10-CM

## 2020-08-24 DIAGNOSIS — C3491 Malignant neoplasm of unspecified part of right bronchus or lung: Secondary | ICD-10-CM | POA: Diagnosis present

## 2020-08-24 DIAGNOSIS — Z79899 Other long term (current) drug therapy: Secondary | ICD-10-CM

## 2020-08-24 DIAGNOSIS — Z809 Family history of malignant neoplasm, unspecified: Secondary | ICD-10-CM

## 2020-08-24 DIAGNOSIS — R079 Chest pain, unspecified: Secondary | ICD-10-CM | POA: Diagnosis present

## 2020-08-24 DIAGNOSIS — J449 Chronic obstructive pulmonary disease, unspecified: Secondary | ICD-10-CM | POA: Diagnosis present

## 2020-08-24 DIAGNOSIS — Z9889 Other specified postprocedural states: Secondary | ICD-10-CM

## 2020-08-24 DIAGNOSIS — Z7951 Long term (current) use of inhaled steroids: Secondary | ICD-10-CM

## 2020-08-24 DIAGNOSIS — K219 Gastro-esophageal reflux disease without esophagitis: Secondary | ICD-10-CM | POA: Diagnosis present

## 2020-08-24 DIAGNOSIS — Z83438 Family history of other disorder of lipoprotein metabolism and other lipidemia: Secondary | ICD-10-CM

## 2020-08-24 DIAGNOSIS — Z9981 Dependence on supplemental oxygen: Secondary | ICD-10-CM

## 2020-08-24 DIAGNOSIS — Z8249 Family history of ischemic heart disease and other diseases of the circulatory system: Secondary | ICD-10-CM

## 2020-08-24 DIAGNOSIS — J90 Pleural effusion, not elsewhere classified: Secondary | ICD-10-CM | POA: Diagnosis present

## 2020-08-24 DIAGNOSIS — G894 Chronic pain syndrome: Secondary | ICD-10-CM | POA: Diagnosis present

## 2020-08-24 DIAGNOSIS — Z902 Acquired absence of lung [part of]: Secondary | ICD-10-CM

## 2020-08-24 LAB — BASIC METABOLIC PANEL
Anion gap: 11 (ref 5–15)
BUN: 30 mg/dL — ABNORMAL HIGH (ref 8–23)
CO2: 22 mmol/L (ref 22–32)
Calcium: 8.9 mg/dL (ref 8.9–10.3)
Chloride: 104 mmol/L (ref 98–111)
Creatinine, Ser: 0.48 mg/dL (ref 0.44–1.00)
GFR, Estimated: >60 mL/min (ref >60)
Glucose, Bld: 116 mg/dL — ABNORMAL HIGH (ref 70–99)
Potassium: 3.7 mmol/L (ref 3.5–5.1)
Sodium: 137 mmol/L (ref 135–145)

## 2020-08-24 LAB — RESP PANEL BY RT-PCR (FLU A&B, COVID) ARPGX2
Influenza A by PCR: NEGATIVE
Influenza B by PCR: NEGATIVE
SARS Coronavirus 2 by RT PCR: NEGATIVE

## 2020-08-24 LAB — CBC
HCT: 31.9 % — ABNORMAL LOW (ref 36.0–46.0)
Hemoglobin: 9.8 g/dL — ABNORMAL LOW (ref 12.0–15.0)
MCH: 33.2 pg (ref 26.0–34.0)
MCHC: 30.7 g/dL (ref 30.0–36.0)
MCV: 108.1 fL — ABNORMAL HIGH (ref 80.0–100.0)
Platelets: 228 10*3/uL (ref 150–400)
RBC: 2.95 MIL/uL — ABNORMAL LOW (ref 3.87–5.11)
RDW: 15.1 % (ref 11.5–15.5)
WBC: 7.2 10*3/uL (ref 4.0–10.5)
nRBC: 0 % (ref 0.0–0.2)

## 2020-08-24 LAB — TROPONIN I (HIGH SENSITIVITY): Troponin I (High Sensitivity): 33 ng/L — ABNORMAL HIGH (ref <18)

## 2020-08-24 MED ORDER — HYDROMORPHONE HCL 1 MG/ML IJ SOLN
0.5000 mg | Freq: Once | INTRAMUSCULAR | Status: AC
  Administered 2020-08-24: 0.5 mg via INTRAVENOUS
  Filled 2020-08-24: qty 1

## 2020-08-24 NOTE — ED Notes (Signed)
Patient transported to X-ray 

## 2020-08-24 NOTE — ED Triage Notes (Signed)
Pt to er room number 12, per ems pt is here for pain and sob, states that she is normally on 3L O2 via , states that pt was satting 99 on 2L.  States that pt had a R lobectomy on may 26th, pt states that it hurts to take a deep breath.  Pt reports a gradual onset of symptoms that got much worse today.  Pt talking in full sentences.  Resps even and unlabored.

## 2020-08-24 NOTE — ED Provider Notes (Signed)
[facility] Emergency Department Provider Note MRN:  [id number]  Arrival date & time: 08/25/20     Chief Complaint   Shortness of Breath   History of Present Illness   LUVERNE FARONE is a 65 y.o. year-old female with a history of COPD, lung cancer presenting to the ED with chief complaint of chest pain.  Location: Right side of the chest Duration: Several days but worse today Onset: Gradual Timing: Constant Description: Sharp Severity: Severe Exacerbating/Alleviating Factors: Worse with deep breaths Associated Symptoms: Shortness of breath Pertinent Negatives: Denies fever or cough, no abdominal pain   Review of Systems  A complete 10 system review of systems was obtained and all systems are negative except as noted in the HPI and PMH.   Patient's Health History    Past Medical History:  Diagnosis Date   Allergy    Anemia    Anxiety    Arthritis    Asthma    Carpal tunnel syndrome    Chronic back pain    Cigarette smoker 1/60/1093   Complication of anesthesia    in the past has had N/V, the last few surgeries she has not been   COPD (chronic obstructive pulmonary disease) ([place])    Easy bruising 07/10/2016   GERD (gastroesophageal reflux disease)    Headache    Heart murmur 2005   never had any problems   History of blood transfusion 2018   History of kidney stones    Hypertension    Hypoglycemia    Hypoglycemia    Iron deficiency anemia 07/11/2016   Neuropathy    both arms   Normocytic anemia 07/29/2015   Pancreatitis    Pneumonia    PONV (postoperative nausea and vomiting)    Postoperative anemia due to acute blood loss 11/15/2015   R lung cancer    Sciatica    Seizures ([place])    as a child when she would have an asthma attack. none as an adult   Shortness of breath dyspnea     Past Surgical History:  Procedure Laterality Date   ABDOMINAL HYSTERECTOMY  2355   APPLICATION OF ROBOTIC ASSISTANCE FOR SPINAL PROCEDURE N/A 09/05/2016    Procedure: APPLICATION OF ROBOTIC ASSISTANCE FOR SPINAL PROCEDURE;  Surgeon: Consuella Lose, MD;  Location: [facility];  Service: Neurosurgery;  Laterality: N/A;   BACK SURGERY     BIOPSY  06/14/2020   Procedure: BIOPSY;  Surgeon: Daneil Dolin, MD;  [facility];  Service: Endoscopy;;   [place]   COLONOSCOPY     unsure last   COLONOSCOPY WITH PROPOFOL N/A 06/14/2020   Procedure: COLONOSCOPY WITH PROPOFOL;  Surgeon: Daneil Dolin, MD;  [facility];  Service: Endoscopy;  Laterality: N/A;  PM   cyst removal face     disk repair     neck and lumbar region   ESOPHAGOGASTRODUODENOSCOPY (EGD) WITH PROPOFOL N/A 06/14/2020   Procedure: ESOPHAGOGASTRODUODENOSCOPY (EGD) WITH PROPOFOL;  Surgeon: Daneil Dolin, MD;  [facility];  Service: Endoscopy;  Laterality: N/A;   FOOT SURGERY Bilateral    to file bones down    HARDWARE REMOVAL N/A 09/05/2016   Procedure: HARDWARE REMOVAL AND REPLACEMENT OF LUMBAR FIVE SCREWS;  Surgeon: Consuella Lose, MD;  Location: [facility];  Service: Neurosurgery;  Laterality: N/A;   IMPLANTATION / PLACEMENT EPIDURAL NEUROSTIMULATOR ELECTRODES     INTERCOSTAL NERVE BLOCK Right 07/29/2020   Procedure:  INTERCOSTAL NERVE BLOCK;  Surgeon: Melrose Nakayama, MD;  Location: [facility];  Service: Thoracic;  Laterality: Right;   LAMINECTOMY     2006   LUMBAR FUSION     LYMPH NODE BIOPSY Right 07/29/2020   Procedure: LYMPH NODE BIOPSY;  Surgeon: Melrose Nakayama, MD;  Location: [facility];  Service: Thoracic;  Laterality: Right;   POLYPECTOMY  06/14/2020   Procedure: POLYPECTOMY INTESTINAL;  Surgeon: Daneil Dolin, MD;  [facility];  Service: Endoscopy;;   SPINAL CORD STIMULATOR BATTERY EXCHANGE     SPINAL CORD STIMULATOR INSERTION N/A 07/05/2016   Procedure: REPLACEMENT OF LUMBAR SPINAL CORD STIMULATOR BATTERY;  Surgeon: Newman Pies, MD;  Location: [facility];  Service: Neurosurgery;  Laterality: N/A;    TONSILLECTOMY      Family History  Problem Relation Age of Onset   Heart disease Mother    Diabetes Mother    Hypertension Mother    Hyperlipidemia Mother    COPD Mother        smoked   Cancer Mother        bile duct   COPD Father    Diabetes Father    Heart disease Father    Cancer Father        throat   Dementia Father    Asthma Son    Hypertension Sister    Hyperlipidemia Sister    Hypertension Brother    Hyperlipidemia Brother    Hypertension Brother    Hyperlipidemia Brother    Emphysema Maternal Grandmother        smoked   Colon cancer Neg Hx    Colon polyps Neg Hx     Social History   Socioeconomic History   Marital status: Married    Spouse name: [name]   Number of children: 1   Years of education: Not on file   Highest education level: Some college, no degree  Occupational History   Not on file  Tobacco Use   Smoking status: Every Day    Packs/day: 0.25    Years: 35.00    Pack years: 8.75    Types: Cigarettes   Smokeless tobacco: Never   Tobacco comments:    trying to quit  Vaping Use   Vaping Use: Never used  Substance and Sexual Activity   Alcohol use: Yes    Alcohol/week: 2.0 standard drinks    Types: 2 Glasses of wine per week    Comment: occasionally   Drug use: No   Sexual activity: Yes    Birth control/protection: None  Other Topics Concern   Not on file  Social History Narrative   Retired, 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 1 son, deceased. 2 pet dogs. Enjoys watching TV.   Social Determinants of Health   Financial Resource Strain: Not on file  Food Insecurity: Not on file  Transportation Needs: Not on file  Physical Activity: Not on file  Stress: Not on file  Social Connections: Not on file  Intimate Partner Violence: Not At Risk   Fear of Current or Ex-Partner: No   Emotionally Abused: No   Physically Abused: No   Sexually Abused: No     Physical Exam   Vitals:   08/25/20 0230 08/25/20 0300  BP: (!)  160/92 (!) 142/72  Pulse: (!) 103 94  Resp: 16 13  Temp:    SpO2: 99% 99%    CONSTITUTIONAL: Chronically ill-appearing, NAD NEURO:  Alert and oriented x 3,  no focal deficits EYES:  eyes equal and reactive ENT/NECK:  no LAD, no JVD CARDIO: Regular rate, well-perfused, normal S1 and S2 PULM:  CTAB no wheezing or rhonchi GI/GU:  normal bowel sounds, non-distended, non-tender MSK/SPINE:  No gross deformities, no edema SKIN:  no rash, atraumatic PSYCH:  Appropriate speech and behavior  *Additional and/or pertinent findings included in MDM below  Diagnostic and Interventional Summary    EKG Interpretation  Date/Time:  Tuesday August 24 2020 21:57:58 EDT Ventricular Rate:  90 PR Interval:  207 QRS Duration: 63 QT Interval:  373 QTC Calculation: 457 R Axis:   62 Text Interpretation: Sinus rhythm Probable left atrial enlargement Probable left ventricular hypertrophy Confirmed by Elnora Morrison [phone]) on 08/24/2020 10:55:30 PM        Labs Reviewed  BASIC METABOLIC PANEL - Abnormal; Notable for the following components:      Result Value   Glucose, Bld 116 (*)    BUN 30 (*)    All other components within normal limits  CBC - Abnormal; Notable for the following components:   RBC 2.95 (*)    Hemoglobin 9.8 (*)    HCT 31.9 (*)    MCV 108.1 (*)    All other components within normal limits  TROPONIN I (HIGH SENSITIVITY) - Abnormal; Notable for the following components:   Troponin I (High Sensitivity) 33 (*)    All other components within normal limits  TROPONIN I (HIGH SENSITIVITY) - Abnormal; Notable for the following components:   Troponin I (High Sensitivity) 42 (*)    All other components within normal limits  RESP PANEL BY RT-PCR (FLU A&B, COVID) [id number]    CT Angio Chest Pulmonary Embolism (PE) W or WO Contrast  Final Result    DG Chest 2 View  Final Result      Medications  HYDROmorphone (DILAUDID) injection 0.5 mg (0.5 mg Intravenous Given 08/24/20 2322)  iohexol  (OMNIPAQUE) 350 MG/ML injection 100 mL (100 mLs Intravenous Contrast Given 08/25/20 0055)  HYDROmorphone (DILAUDID) injection 0.5 mg (0.5 mg Intravenous Given 08/25/20 0217)     Procedures  /  Critical Care Procedures  ED Course and Medical Decision Making  I have reviewed the triage vital signs, the nursing notes, and pertinent available records from the EMR.  Listed above are laboratory and imaging tests that I personally ordered, reviewed, and interpreted and then considered in my medical decision making (see below for details).  Pleuritic chest pain about 1 month removed from lobectomy, CTA needed to evaluate for PE, evaluate for postoperative bleeding or complication.     Troponin is minimally elevated, slightly increasing on repeat.  CT revealing new moderate sized effusion near the lobectomy site.  Discussed case with Dr. Kipp Brood of cardiothoracic surgery, thoracentesis is planned, can be done outpatient.  However given the elevated troponins will admit to medicine, will admit to the [facility] center where cardiothoracic surgery will be able to manage this in house.  Barth Kirks. Sedonia Small, MD [place] [email or web address]  Final Clinical Impressions(s) / ED Diagnoses     ICD-10-CM   1. Chest pain, unspecified type  R07.9     2. Elevated troponin  R77.8     3. Pleural effusion  J90       ED Discharge Orders     None        Discharge Instructions Discussed with and Provided to Patient:   Discharge Instructions   None  Maudie Flakes, MD 08/25/20 [phone]

## 2020-08-25 ENCOUNTER — Inpatient Hospital Stay (HOSPITAL_COMMUNITY): Payer: Medicare Other

## 2020-08-25 ENCOUNTER — Emergency Department (HOSPITAL_COMMUNITY): Payer: Medicare Other

## 2020-08-25 ENCOUNTER — Encounter (HOSPITAL_COMMUNITY): Payer: Self-pay | Admitting: Internal Medicine

## 2020-08-25 DIAGNOSIS — R0781 Pleurodynia: Secondary | ICD-10-CM | POA: Diagnosis not present

## 2020-08-25 DIAGNOSIS — R079 Chest pain, unspecified: Secondary | ICD-10-CM | POA: Diagnosis present

## 2020-08-25 DIAGNOSIS — S2231XA Fracture of one rib, right side, initial encounter for closed fracture: Secondary | ICD-10-CM | POA: Diagnosis present

## 2020-08-25 DIAGNOSIS — J948 Other specified pleural conditions: Secondary | ICD-10-CM | POA: Diagnosis present

## 2020-08-25 DIAGNOSIS — Z90710 Acquired absence of both cervix and uterus: Secondary | ICD-10-CM | POA: Diagnosis not present

## 2020-08-25 DIAGNOSIS — E43 Unspecified severe protein-calorie malnutrition: Secondary | ICD-10-CM | POA: Diagnosis present

## 2020-08-25 DIAGNOSIS — J449 Chronic obstructive pulmonary disease, unspecified: Secondary | ICD-10-CM | POA: Diagnosis present

## 2020-08-25 DIAGNOSIS — Z880 Allergy status to penicillin: Secondary | ICD-10-CM | POA: Diagnosis not present

## 2020-08-25 DIAGNOSIS — J439 Emphysema, unspecified: Secondary | ICD-10-CM | POA: Diagnosis not present

## 2020-08-25 DIAGNOSIS — K219 Gastro-esophageal reflux disease without esophagitis: Secondary | ICD-10-CM | POA: Diagnosis present

## 2020-08-25 DIAGNOSIS — Z888 Allergy status to other drugs, medicaments and biological substances status: Secondary | ICD-10-CM | POA: Diagnosis not present

## 2020-08-25 DIAGNOSIS — I10 Essential (primary) hypertension: Secondary | ICD-10-CM | POA: Diagnosis present

## 2020-08-25 DIAGNOSIS — Z902 Acquired absence of lung [part of]: Secondary | ICD-10-CM | POA: Diagnosis not present

## 2020-08-25 DIAGNOSIS — F17210 Nicotine dependence, cigarettes, uncomplicated: Secondary | ICD-10-CM | POA: Diagnosis present

## 2020-08-25 DIAGNOSIS — S270XXA Traumatic pneumothorax, initial encounter: Secondary | ICD-10-CM | POA: Diagnosis present

## 2020-08-25 DIAGNOSIS — Z20822 Contact with and (suspected) exposure to covid-19: Secondary | ICD-10-CM | POA: Diagnosis present

## 2020-08-25 DIAGNOSIS — J9611 Chronic respiratory failure with hypoxia: Secondary | ICD-10-CM

## 2020-08-25 DIAGNOSIS — C3491 Malignant neoplasm of unspecified part of right bronchus or lung: Secondary | ICD-10-CM | POA: Diagnosis present

## 2020-08-25 DIAGNOSIS — Z9889 Other specified postprocedural states: Secondary | ICD-10-CM | POA: Diagnosis not present

## 2020-08-25 DIAGNOSIS — Z981 Arthrodesis status: Secondary | ICD-10-CM | POA: Diagnosis not present

## 2020-08-25 DIAGNOSIS — G894 Chronic pain syndrome: Secondary | ICD-10-CM | POA: Diagnosis present

## 2020-08-25 DIAGNOSIS — R0602 Shortness of breath: Secondary | ICD-10-CM | POA: Diagnosis present

## 2020-08-25 DIAGNOSIS — J90 Pleural effusion, not elsewhere classified: Secondary | ICD-10-CM

## 2020-08-25 DIAGNOSIS — F32A Depression, unspecified: Secondary | ICD-10-CM | POA: Diagnosis present

## 2020-08-25 DIAGNOSIS — G629 Polyneuropathy, unspecified: Secondary | ICD-10-CM | POA: Diagnosis present

## 2020-08-25 DIAGNOSIS — Z720 Tobacco use: Secondary | ICD-10-CM

## 2020-08-25 DIAGNOSIS — Z833 Family history of diabetes mellitus: Secondary | ICD-10-CM | POA: Diagnosis not present

## 2020-08-25 DIAGNOSIS — Z83438 Family history of other disorder of lipoprotein metabolism and other lipidemia: Secondary | ICD-10-CM | POA: Diagnosis not present

## 2020-08-25 DIAGNOSIS — Z825 Family history of asthma and other chronic lower respiratory diseases: Secondary | ICD-10-CM | POA: Diagnosis not present

## 2020-08-25 DIAGNOSIS — Z9981 Dependence on supplemental oxygen: Secondary | ICD-10-CM | POA: Diagnosis not present

## 2020-08-25 LAB — CBC
HCT: 35.6 % — ABNORMAL LOW (ref 36.0–46.0)
Hemoglobin: 10.9 g/dL — ABNORMAL LOW (ref 12.0–15.0)
MCH: 32.7 pg (ref 26.0–34.0)
MCHC: 30.6 g/dL (ref 30.0–36.0)
MCV: 106.9 fL — ABNORMAL HIGH (ref 80.0–100.0)
Platelets: 231 10*3/uL (ref 150–400)
RBC: 3.33 MIL/uL — ABNORMAL LOW (ref 3.87–5.11)
RDW: 15.1 % (ref 11.5–15.5)
WBC: 5.3 10*3/uL (ref 4.0–10.5)
nRBC: 0 % (ref 0.0–0.2)

## 2020-08-25 LAB — MAGNESIUM: Magnesium: 1.9 mg/dL (ref 1.7–2.4)

## 2020-08-25 LAB — BODY FLUID CELL COUNT WITH DIFFERENTIAL
Eos, Fluid: 1 %
Lymphs, Fluid: 89 %
Monocyte-Macrophage-Serous Fluid: 8 % — ABNORMAL LOW (ref 50–90)
Neutrophil Count, Fluid: 2 % (ref 0–25)
Total Nucleated Cell Count, Fluid: 1383 cu mm — ABNORMAL HIGH (ref 0–1000)

## 2020-08-25 LAB — GRAM STAIN: Gram Stain: NONE SEEN

## 2020-08-25 LAB — COMPREHENSIVE METABOLIC PANEL
ALT: 19 U/L (ref 0–44)
AST: 24 U/L (ref 15–41)
Albumin: 3.6 g/dL (ref 3.5–5.0)
Alkaline Phosphatase: 78 U/L (ref 38–126)
Anion gap: 9 (ref 5–15)
BUN: 28 mg/dL — ABNORMAL HIGH (ref 8–23)
CO2: 26 mmol/L (ref 22–32)
Calcium: 9.4 mg/dL (ref 8.9–10.3)
Chloride: 102 mmol/L (ref 98–111)
Creatinine, Ser: 0.41 mg/dL — ABNORMAL LOW (ref 0.44–1.00)
GFR, Estimated: >60 mL/min (ref >60)
Glucose, Bld: 114 mg/dL — ABNORMAL HIGH (ref 70–99)
Potassium: 4.4 mmol/L (ref 3.5–5.1)
Sodium: 137 mmol/L (ref 135–145)
Total Bilirubin: 0.5 mg/dL (ref 0.3–1.2)
Total Protein: 6.8 g/dL (ref 6.5–8.1)

## 2020-08-25 LAB — PROTIME-INR
INR: 0.9 (ref 0.8–1.2)
Prothrombin Time: 11.9 seconds (ref 11.4–15.2)

## 2020-08-25 LAB — HIV ANTIBODY (ROUTINE TESTING W REFLEX): HIV Screen 4th Generation wRfx: NONREACTIVE

## 2020-08-25 LAB — PHOSPHORUS: Phosphorus: 4.4 mg/dL (ref 2.5–4.6)

## 2020-08-25 LAB — TROPONIN I (HIGH SENSITIVITY): Troponin I (High Sensitivity): 42 ng/L — ABNORMAL HIGH (ref <18)

## 2020-08-25 LAB — APTT: aPTT: 28 seconds (ref 24–36)

## 2020-08-25 MED ORDER — TRAZODONE HCL 50 MG PO TABS
200.0000 mg | ORAL_TABLET | Freq: Every evening | ORAL | Status: DC | PRN
  Administered 2020-08-25: 200 mg via ORAL
  Filled 2020-08-25: qty 4

## 2020-08-25 MED ORDER — KETOROLAC TROMETHAMINE 15 MG/ML IJ SOLN
15.0000 mg | Freq: Four times a day (QID) | INTRAMUSCULAR | Status: DC | PRN
  Administered 2020-08-25: 15 mg via INTRAVENOUS
  Filled 2020-08-25: qty 1

## 2020-08-25 MED ORDER — METHOCARBAMOL 1000 MG/10ML IJ SOLN
500.0000 mg | Freq: Three times a day (TID) | INTRAVENOUS | Status: DC | PRN
  Administered 2020-08-25: 500 mg via INTRAVENOUS
  Filled 2020-08-25: qty 5

## 2020-08-25 MED ORDER — POLYSACCHARIDE IRON COMPLEX 150 MG PO CAPS
150.0000 mg | ORAL_CAPSULE | Freq: Every day | ORAL | Status: DC
  Administered 2020-08-25 – 2020-08-26 (×2): 150 mg via ORAL
  Filled 2020-08-25 (×2): qty 1

## 2020-08-25 MED ORDER — HYDROMORPHONE HCL 1 MG/ML IJ SOLN
1.0000 mg | INTRAMUSCULAR | Status: DC | PRN
  Administered 2020-08-25 – 2020-08-26 (×7): 1 mg via INTRAVENOUS
  Filled 2020-08-25 (×7): qty 1

## 2020-08-25 MED ORDER — HYDROCODONE-ACETAMINOPHEN 5-325 MG PO TABS
1.5000 | ORAL_TABLET | Freq: Four times a day (QID) | ORAL | Status: DC | PRN
  Administered 2020-08-26: 1.5 via ORAL
  Filled 2020-08-25: qty 2

## 2020-08-25 MED ORDER — AMLODIPINE BESYLATE 5 MG PO TABS
10.0000 mg | ORAL_TABLET | Freq: Every day | ORAL | Status: DC
  Administered 2020-08-25 – 2020-08-26 (×2): 10 mg via ORAL
  Filled 2020-08-25 (×2): qty 2

## 2020-08-25 MED ORDER — MAGNESIUM OXIDE -MG SUPPLEMENT 400 (240 MG) MG PO TABS
400.0000 mg | ORAL_TABLET | Freq: Two times a day (BID) | ORAL | Status: DC
  Administered 2020-08-25 – 2020-08-26 (×3): 400 mg via ORAL
  Filled 2020-08-25 (×3): qty 1

## 2020-08-25 MED ORDER — OXYCODONE HCL ER 15 MG PO T12A
15.0000 mg | EXTENDED_RELEASE_TABLET | Freq: Two times a day (BID) | ORAL | Status: DC
  Administered 2020-08-25 – 2020-08-26 (×3): 15 mg via ORAL
  Filled 2020-08-25 (×7): qty 1

## 2020-08-25 MED ORDER — FLUTICASONE FUROATE-VILANTEROL 200-25 MCG/INH IN AEPB
1.0000 | INHALATION_SPRAY | Freq: Every day | RESPIRATORY_TRACT | Status: DC
  Administered 2020-08-25 – 2020-08-26 (×2): 1 via RESPIRATORY_TRACT
  Filled 2020-08-25: qty 28

## 2020-08-25 MED ORDER — IOHEXOL 350 MG/ML SOLN
100.0000 mL | Freq: Once | INTRAVENOUS | Status: AC | PRN
  Administered 2020-08-25: 100 mL via INTRAVENOUS

## 2020-08-25 MED ORDER — UMECLIDINIUM BROMIDE 62.5 MCG/INH IN AEPB
1.0000 | INHALATION_SPRAY | Freq: Every day | RESPIRATORY_TRACT | Status: DC
  Administered 2020-08-25 – 2020-08-26 (×2): 1 via RESPIRATORY_TRACT
  Filled 2020-08-25: qty 7

## 2020-08-25 MED ORDER — DULOXETINE HCL 60 MG PO CPEP
90.0000 mg | ORAL_CAPSULE | Freq: Every day | ORAL | Status: DC
  Administered 2020-08-25 – 2020-08-26 (×2): 90 mg via ORAL
  Filled 2020-08-25: qty 3
  Filled 2020-08-25: qty 1

## 2020-08-25 MED ORDER — ATORVASTATIN CALCIUM 40 MG PO TABS
40.0000 mg | ORAL_TABLET | Freq: Every day | ORAL | Status: DC
  Administered 2020-08-25 – 2020-08-26 (×2): 40 mg via ORAL
  Filled 2020-08-25 (×2): qty 1

## 2020-08-25 MED ORDER — NICOTINE 14 MG/24HR TD PT24
14.0000 mg | MEDICATED_PATCH | Freq: Every day | TRANSDERMAL | Status: DC
  Administered 2020-08-25 – 2020-08-26 (×2): 14 mg via TRANSDERMAL
  Filled 2020-08-25 (×2): qty 1

## 2020-08-25 MED ORDER — ENSURE ENLIVE PO LIQD
237.0000 mL | Freq: Two times a day (BID) | ORAL | Status: DC
  Administered 2020-08-25 – 2020-08-26 (×2): 237 mL via ORAL

## 2020-08-25 MED ORDER — PANTOPRAZOLE SODIUM 40 MG PO TBEC
40.0000 mg | DELAYED_RELEASE_TABLET | Freq: Every day | ORAL | Status: DC
  Administered 2020-08-25 – 2020-08-26 (×2): 40 mg via ORAL
  Filled 2020-08-25 (×2): qty 1

## 2020-08-25 MED ORDER — HYDROMORPHONE HCL 1 MG/ML IJ SOLN
0.5000 mg | Freq: Once | INTRAMUSCULAR | Status: AC
  Administered 2020-08-25: 0.5 mg via INTRAVENOUS
  Filled 2020-08-25: qty 1

## 2020-08-25 MED ORDER — DEXTROMETHORPHAN POLISTIREX ER 30 MG/5ML PO SUER
30.0000 mg | Freq: Two times a day (BID) | ORAL | Status: DC | PRN
  Administered 2020-08-25: 30 mg via ORAL
  Filled 2020-08-25: qty 5

## 2020-08-25 MED ORDER — IPRATROPIUM-ALBUTEROL 0.5-2.5 (3) MG/3ML IN SOLN: 3.0000 mL | Freq: Four times a day (QID) | RESPIRATORY_TRACT | Status: DC | PRN

## 2020-08-25 MED ORDER — DICLOFENAC SODIUM 1 % TD GEL: 1.0000 "application " | Freq: Every day | TRANSDERMAL | Status: DC | PRN

## 2020-08-25 MED ORDER — HYDROCODONE BITARTRATE ER 60 MG PO T24A: 60.0000 mg | EXTENDED_RELEASE_TABLET | Freq: Every day | ORAL | Status: DC

## 2020-08-25 NOTE — Sedation Documentation (Signed)
Pt tolerated right thoracentesis procedure well today and 120 mL of clear light amber fluid removed and sent to lab for processing. PT taken to chest xray post procedure and read by radiologist then returned to ED room 12 via stretcher with stable vital signs and no acute distress noted. PT verbalized understanding of procedure instructions and given paper copy of post procedure instructions. Report given to ED nurse Vaughan Basta, RN upon return.

## 2020-08-25 NOTE — Progress Notes (Signed)
Jasmine [name]  Code Status: FULL [name] is a 65 y.o. female patient admitted from ED awake, alert - oriented X4 - no acute distress noted. Fall assessment complete, with patient able to verbalize understanding of risk associated with falls, and verbalized understanding to call nursing before up out of bed. Call light within reach, patient able to voice, and demonstrate understanding.  No evidence of skin break down noted on exam.  ?  Will cont to eval and treat per MD orders.  Melonie Florida, RN  08/25/2020

## 2020-08-25 NOTE — H&P (Signed)
History and Physical    Jasmine [name] JJH:[id number] DOB: 1956-02-20 DOA: 08/24/2020  PCP: Janora Norlander, DO   Patient coming from: Home  I have personally briefly reviewed patient's old medical records in [place]  Chief Complaint: Pleuritic chest pain  HPI: Jasmine [name] is a 65 y.o. 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  Patient expressed experiencing ongoing chest pain for the last 3-4 weeks after upper lobectomy on the right side, but in the last 5 days prior to admission pain has become worse and has been associated with shortness of breath and dyspnea on exertion. Patient reports no fever, no chills, no nausea, no vomiting, no dysuria, no hematuria, no melena, no hematochezia, no new focal deficits (patient with chronic right foot drop and bilateral upper extremities neuropathy).  Reports no sick contacts.  Patient is s/p vaccination x2 with Levan Hurst and [employer] booster  ED Course: In the ED CT chest demonstrating slight pneumothorax in the apical region along with expected changes from recent surgery; positive moderate loculated pleural effusion and right side seventh rib healing fracture.  Mild troponin flat elevation, no acute ischemic changes on EKG and otherwise a stable blood work.  TRH has been consulted to further assist patient with management of her pain and to further address ongoing pleural effusion.  Initially cardiothoracic surgeon contacted with recommendations to go to [facility] for thoracentesis and further evaluation; I have discussed with Dr. Roxan Hockey (patient's cardiothoracic surgeon that performed upper lobectomy in May 26/2022).  Who recommended to perform  thoracentesis to rule out infection and to continue medical management.  No need for transfer.  Review of Systems: As per HPI otherwise all other systems reviewed and are negative.  Past Medical History:  Diagnosis Date   Allergy    Anemia    Anxiety    Arthritis    Asthma    Carpal tunnel syndrome    Chronic back pain    Cigarette smoker 10/21/5629   Complication of anesthesia    in the past has had N/V, the last few surgeries she has not been   COPD (chronic obstructive pulmonary disease) ([place])    Easy bruising 07/10/2016   GERD (gastroesophageal reflux disease)    Headache    Heart murmur 2005   never had any problems   History of blood transfusion 2018   History of kidney stones    Hypertension    Hypoglycemia    Hypoglycemia    Iron deficiency anemia 07/11/2016   Neuropathy    both arms   Normocytic anemia 07/29/2015   Pancreatitis    Pneumonia    PONV (postoperative nausea and vomiting)    Postoperative anemia due to acute blood loss 11/15/2015   R lung cancer    Sciatica    Seizures ([place])    as a child when she would have an asthma attack. none as an adult   Shortness of breath dyspnea     Past Surgical History:  Procedure Laterality Date   ABDOMINAL HYSTERECTOMY  4970   APPLICATION OF ROBOTIC ASSISTANCE FOR SPINAL PROCEDURE N/A 09/05/2016   Procedure: APPLICATION OF ROBOTIC ASSISTANCE FOR SPINAL PROCEDURE;  Surgeon: Consuella Lose, MD;  Location: [place];  Service: Neurosurgery;  Laterality: N/A;   BACK SURGERY  BIOPSY  06/14/2020   Procedure: BIOPSY;  Surgeon: Daneil Dolin, MD;  [facility];  Service: Endoscopy;;   [place]   COLONOSCOPY     unsure last   COLONOSCOPY WITH PROPOFOL N/A 06/14/2020   Procedure: COLONOSCOPY WITH PROPOFOL;  Surgeon: Daneil Dolin, MD;  [facility];  Service: Endoscopy;  Laterality: N/A;  PM   cyst removal face     disk repair     neck and lumbar region    ESOPHAGOGASTRODUODENOSCOPY (EGD) WITH PROPOFOL N/A 06/14/2020   Procedure: ESOPHAGOGASTRODUODENOSCOPY (EGD) WITH PROPOFOL;  Surgeon: Daneil Dolin, MD;  [facility];  Service: Endoscopy;  Laterality: N/A;   FOOT SURGERY Bilateral    to file bones down    HARDWARE REMOVAL N/A 09/05/2016   Procedure: HARDWARE REMOVAL AND REPLACEMENT OF LUMBAR FIVE SCREWS;  Surgeon: Consuella Lose, MD;  Location: [place];  Service: Neurosurgery;  Laterality: N/A;   IMPLANTATION / PLACEMENT EPIDURAL NEUROSTIMULATOR ELECTRODES     INTERCOSTAL NERVE BLOCK Right 07/29/2020   Procedure: INTERCOSTAL NERVE BLOCK;  Surgeon: Melrose Nakayama, MD;  [facility];  Service: Thoracic;  Laterality: Right;   LAMINECTOMY     2006   LUMBAR FUSION     LYMPH NODE BIOPSY Right 07/29/2020   Procedure: LYMPH NODE BIOPSY;  Surgeon: Melrose Nakayama, MD;  Location: [place];  Service: Thoracic;  Laterality: Right;   POLYPECTOMY  06/14/2020   Procedure: POLYPECTOMY INTESTINAL;  Surgeon: Daneil Dolin, MD;  [facility];  Service: Endoscopy;;   SPINAL CORD STIMULATOR BATTERY EXCHANGE     SPINAL CORD STIMULATOR INSERTION N/A 07/05/2016   Procedure: REPLACEMENT OF LUMBAR SPINAL CORD STIMULATOR BATTERY;  Surgeon: Newman Pies, MD;  Location: [place];  Service: Neurosurgery;  Laterality: N/A;   TONSILLECTOMY      Social History  reports that she has been smoking cigarettes. She has a 8.75 pack-year smoking history. She has never used smokeless tobacco. She reports current alcohol use of about 2.0 standard drinks of alcohol per week. She reports that she does not use drugs.  Allergies  Allergen Reactions   Lyrica [Pregabalin] Other (See Comments)    EXTREME SHAKING/TREMBLING   Darvon [Propoxyphene]     UNSPECIFIED REACTION    Gabapentin     Extreme shaking and trembling    Augmentin [Amoxicillin-Pot Clavulanate] Nausea And Vomiting    Has patient had a PCN reaction causing immediate rash,  facial/tongue/throat swelling, SOB or lightheadedness with hypotension:no Has patient had a PCN reaction causing severe rash involving mucus membranes or skin necrosis:No Has patient had a PCN reaction that required hospitalization:No Has patient had a PCN reaction occurring within the last 10 years:Yes--ONLY N/V If all of the above answers are "NO", then may proceed with Cephalosporin use.    Erythromycin Itching and Rash        Vibramycin [Doxycycline Calcium] Itching and Rash    "[place] "    Family History  Problem Relation Age of Onset   Heart disease Mother    Diabetes Mother    Hypertension Mother    Hyperlipidemia Mother    COPD Mother        smoked   Cancer Mother        bile duct   COPD Father    Diabetes Father    Heart disease Father    Cancer Father  throat   Dementia Father    Asthma Son    Hypertension Sister    Hyperlipidemia Sister    Hypertension Brother    Hyperlipidemia Brother    Hypertension Brother    Hyperlipidemia Brother    Emphysema Maternal Grandmother        smoked   Colon cancer Neg Hx    Colon polyps Neg Hx     Prior to Admission medications   Medication Sig Start Date End Date Taking? Authorizing Provider  albuterol (VENTOLIN HFA) 108 (90 Base) MCG/ACT inhaler Inhale into the lungs every 6 (six) hours as needed for wheezing or shortness of breath.   Yes [provider]  amLODipine (NORVASC) 10 MG tablet Take 1 tablet (10 mg total) by mouth daily. 07/09/20  Yes Gottschalk, Leatrice Jewels M, DO  atorvastatin (LIPITOR) 40 MG tablet Take 1 tablet (40 mg total) by mouth daily. 07/09/20  Yes Gottschalk, Ashly M, DO  budesonide-formoterol (SYMBICORT) 160-4.5 MCG/ACT inhaler USE 2 INHALATIONS TWICE A DAY Patient taking differently: Inhale 2 puffs into the lungs in the morning and at bedtime. USE 2 INHALATIONS TWICE A DAY 05/05/20  Yes Ronnie Doss M, DO  Cholecalciferol 125 MCG (5000 UT) capsule Take 5,000 Units daily by mouth.   Yes  [provider]  diclofenac sodium (VOLTAREN) 1 % GEL Apply 1 application topically daily as needed for muscle pain. 06/30/16  Yes [provider]  DULoxetine (CYMBALTA) 30 MG capsule Take 3 capsules (90 mg total) by mouth daily. 07/09/20  Yes Gottschalk, Leatrice Jewels M, DO  feeding supplement, ENSURE ENLIVE, (ENSURE ENLIVE) LIQD Take 237 mLs by mouth 2 (two) times daily between meals. 07/04/17  Yes Nita Sells, MD  guaiFENesin (MUCINEX) 600 MG 12 hr tablet Take 600 mg by mouth 2 (two) times daily.   Yes [provider]  HYDROcodone Bitartrate ER 60 MG T24A Take 60 mg by mouth daily.   Yes [provider]  HYDROcodone-acetaminophen (NORCO) 7.5-325 MG tablet Take 1 tablet by mouth every 6 (six) hours as needed. 08/09/20  Yes [provider]  ipratropium-albuterol (DUONEB) 0.5-2.5 (3) MG/3ML SOLN Inhale 3 mLs into the lungs 4 (four) times daily as needed (for shortness of breath/wheezing). *May use as needed 02/21/16  Yes [provider]  Iron Polysacch Cmplx-B12-FA (POLY-IRON 150 FORTE) 150-0.025-1 MG CAPS Take 1 capsule by mouth 2 (two) times daily. Patient taking differently: Take 1 capsule by mouth daily. 05/27/20  Yes Derek Jack, MD  L-Methylfolate-Algae-B12-B6 [facility]) 3-90.314-2-35 MG CAPS TAKE 1 TABLET BY MOUTH TWO TIMES A DAY Patient taking differently: Take 1 tablet by mouth daily. 09/10/19  Yes Gottschalk, Leatrice Jewels M, DO  magnesium oxide (MAG-OX) 400 (241.3 Mg) MG tablet Take 400 mg by mouth 2 (two) times daily. 02/07/20  Yes [provider]  nicotine (NICODERM CQ) 14 mg/24hr patch Place 1 patch (14 mg total) onto the skin daily. 06/23/20  Yes Gottschalk, Leatrice Jewels M, DO  omeprazole (PRILOSEC) 40 MG capsule Take 1 capsule (40 mg total) by mouth 2 (two) times daily. Take 1 capsule 30 mins before breakfast and evening meal. 04/27/20  Yes Walden Field A, NP  ondansetron (ZOFRAN-ODT) 8 MG disintegrating tablet Take 8 mg by mouth every 8 (eight)  hours as needed for vomiting or nausea. 01/10/20  Yes [provider]  OXYGEN Inhale 3 L into the lungs See admin instructions. continuous   Yes [provider]  SPIRIVA RESPIMAT 2.5 MCG/ACT AERS USE 2 INHALATIONS DAILY Patient taking differently: Inhale 2 puffs  into the lungs daily. 07/13/20  Yes Tanda Rockers, MD  tizanidine (ZANAFLEX) 6 MG capsule Take 6 mg by mouth 3 (three) times daily. 11/27/16  Yes [provider]  traZODone (DESYREL) 150 MG tablet TAKE 1 TO 2 TABLETS AT BEDTIME Patient taking differently: Take 150-300 mg by mouth at bedtime. 02/02/20   Janora Norlander, DO    Physical Exam: Vitals:   08/25/20 0600 08/25/20 0630 08/25/20 0700 08/25/20 0730  BP: (!) 158/85 (!) 169/84 (!) 153/84 (!) 145/78  Pulse:  (!) 103 83   Resp: 14 20 12 12   Temp:      TempSrc:      SpO2:  94% 100%   Weight:      Height:        Constitutional: Reporting right-sided chest pleuritic discomfort; no nausea, no vomiting, no fever. Vitals:   08/25/20 0600 08/25/20 0630 08/25/20 0700 08/25/20 0730  BP: (!) 158/85 (!) 169/84 (!) 153/84 (!) 145/78  Pulse:  (!) 103 83   Resp: 14 20 12 12   Temp:      TempSrc:      SpO2:  94% 100%   Weight:      Height:       Eyes: PERRL, lids and conjunctivae normal; no icterus, no nystagmus. ENMT: Mucous membranes are moist. Posterior pharynx clear of any exudate or lesions. Neck: normal, supple, no masses, no thyromegaly Respiratory: Good air movement bilaterally; no using accessory muscle.  Good oxygen saturation on chronic supplementation. Cardiovascular: Regular rate and rhythm, no murmurs / rubs / gallops. No extremity edema.  No JVD on exam. Abdomen: no tenderness, no masses palpated. No hepatosplenomegaly. Bowel sounds positive.  Musculoskeletal: no cyanosis or clubbing; moving 4 limbs spontaneously.  No joint swelling or deformity. Skin: no rashes, no petechiae. Neurologic: CN 2-12 grossly intact.  No new focal  deficit. Psychiatric: Normal judgment and insight. Alert and oriented x 3. Normal mood.    Labs on Admission: I have personally reviewed following labs and imaging studies  CBC: Recent Labs  Lab 08/24/20 2205 08/25/20 0357  WBC 7.2 5.3  HGB 9.8* 10.9*  HCT 31.9* 35.6*  MCV 108.1* 106.9*  PLT 228 295    Basic Metabolic Panel: Recent Labs  Lab 08/24/20 2205 08/25/20 0357  NA 137 137  K 3.7 4.4  CL 104 102  CO2 22 26  GLUCOSE 116* 114*  BUN 30* 28*  CREATININE 0.48 0.41*  CALCIUM 8.9 9.4  MG  --  1.9  PHOS  --  4.4    GFR: Estimated Creatinine Clearance: 46.4 mL/min (A) (by C-G formula based on SCr of 0.41 mg/dL (L)).  Liver Function Tests: Recent Labs  Lab 08/25/20 0357  AST 24  ALT 19  ALKPHOS 78  BILITOT 0.5  PROT 6.8  ALBUMIN 3.6    Urine analysis:    Component Value Date/Time   COLORURINE YELLOW 08/06/2020 1711   APPEARANCEUR HAZY (A) 08/06/2020 1711   APPEARANCEUR Clear 07/26/2017 1636   LABSPEC 1.020 08/06/2020 1711   PHURINE 5.0 08/06/2020 1711   GLUCOSEU NEGATIVE 08/06/2020 1711   HGBUR NEGATIVE 08/06/2020 1711   BILIRUBINUR NEGATIVE 08/06/2020 1711   BILIRUBINUR Negative 07/26/2017 1636   KETONESUR 5 (A) 08/06/2020 1711   PROTEINUR 100 (A) 08/06/2020 1711   NITRITE NEGATIVE 08/06/2020 1711   LEUKOCYTESUR NEGATIVE 08/06/2020 1711    Radiological Exams on Admission: DG Chest 2 View  Result Date: 08/24/2020 CLINICAL DATA:  Chest pain and shortness of breath. Patient  reports pain since May 26 a thorough lobectomy but worse today. History of lung cancer. EXAM: CHEST - 2 VIEW COMPARISON:  Radiograph 1 week ago 08/17/2020.  Chest CT 07/21/2020 FINDINGS: Postsurgical change in the right hemithorax with chain sutures at the hilum and postsurgical volume loss. Again seen blunting of the right costophrenic angle which may be postsurgical or small effusion. Previous pneumothorax is not definitively seen on the current exam. Moderate apical thickening  may represent loculated fluid or scarring. Stable heart size and mediastinal contours. Aortic atherosclerosis. Emphysema. No focal abnormality in the left lung. Spinal stimulator in place. No acute osseous abnormalities are seen. IMPRESSION: Postsurgical change in the right hemithorax. Blunting of the right costophrenic angle may be postsurgical or small effusion, no residual pneumothorax component. No acute radiographic findings. Electronically Signed   By: Keith Rake M.D.   On: 08/24/2020 22:38   CT Angio Chest Pulmonary Embolism (PE) W or WO Contrast  Result Date: 08/25/2020 CLINICAL DATA:  PE suspected, high prob vs post op bleeding or complication EXAM: CT ANGIOGRAPHY CHEST WITH CONTRAST TECHNIQUE: Multidetector CT imaging of the chest was performed using the standard protocol during bolus administration of intravenous contrast. Multiplanar CT image reconstructions and MIPs were obtained to evaluate the vascular anatomy. CONTRAST:  119mL OMNIPAQUE IOHEXOL 350 MG/ML SOLN COMPARISON:  PET CT 04/19/2020 FINDINGS: Cardiovascular: Normal heart size. No significant pericardial effusion. The thoracic aorta is normal in caliber. At least mild atherosclerotic plaque of the thoracic aorta. At least 2 vessel coronary artery calcifications. Mediastinum/Nodes: No enlarged mediastinal, hilar, or axillary lymph nodes. Thyroid gland, trachea, and esophagus demonstrate no significant findings. Lungs/Pleura: Surgical changes status post right upper lobectomy. Interval development of a small to moderate-sized loculated basilar and apical pleural effusion. Trace right apical pneumothorax. Centrilobular emphysematous changes. No focal consolidation. No pulmonary nodule or mass. Passive atelectasis of the right lower lobe. No left pleural effusion or pneumothorax. No findings of hemothorax. Upper Abdomen: Interval development of intra and extrahepatic biliary ductal dilatation. Grossly stable versus slightly increased main  pancreatic duct dilatation. Otherwise no acute abnormality. Musculoskeletal: No chest wall abnormality. No suspicious lytic or blastic osseous lesions. No acute displaced fracture. Multilevel degenerative changes of the spine. Neural stimulator leads noted terminating within the cyst midthoracic posterior spinal canal. Review of the MIP images confirms the above findings. IMPRESSION: 1. No pulmonary embolus. 2. Surgical changes status post right upper lobectomy with interval development of a trace apical right pneumothorax and small to moderate-sized right loculated basilar and apical pleural effusion. 3. Interval development of intra and extrahepatic biliary ductal dilatation with likely slightly increased main pancreatic duct dilatation. Recommend nonemergent MRI pancreatic protocol. 4. Aortic Atherosclerosis (ICD10-I70.0) and Emphysema (ICD10-J43.9). Electronically Signed   By: Iven Finn M.D.   On: 08/25/2020 01:28    EKG: Independently reviewed.  Sinus rhythm; no acute ischemic changes.  Assessment/Plan 1-Chest pain -Pleuritic in nature; secondary to right-sided healing rib fracture versus pleural effusion. -S/p recent right upper lobectomy secondary to squamous lung cancer.   -Case discussed with cardiothoracic surgery who recommended thoracentesis and fluid evaluation to rule out infection -Continue as needed analgesics and supportive care -Patient with underlying history of chronic pain, which will make it difficult to achieve good control.  2-chronic respiratory failure/COPD/asthma -Continue home oxygen supplementation -Continue as needed bronchodilators and maintenance therapy. -No wheezing currently. -Continue as needed antitussive medications.  3-gastroesophageal flux disease -Continue PPI.  4-history of tobacco abuse -Cessation counseling provided. -Nicotine patch has been ordered.  5-squamous lung cancer -Continue outpatient follow-up with oncology service -S/p robotic  lobectomy.  6-history of depression/insomnia -Continue the use of Cymbalta and trazodone.  7-hypertension -Continue Norvasc -Heart healthy diet has been started.  8-history of chronic pain syndrome -Resume home analgesic regimen -IV Dilaudid has been added to help with breakthrough and severe pain in the setting of new pleuritic chest discomfort.  9-gastroesophageal reflux disease -Continue PPI.  10-hyperlipidemia -Continue statins.   DVT prophylaxis: SCDs  Code Status:   Full code Family Communication:  No family at bedside. Disposition Plan:   Patient is from:  Home  Anticipated DC to:  Home  Anticipated DC date:  Approximately 48 hours.  Anticipated DC barriers: Control pleuritic chest pain and rule out pleural effusion infection.  Consults called:  Cardiothoracic surgeon curbside (Dr. Roxan Hockey).  Admission status:  Inpatient,  MedSurg bed, length of stay more than 2 midnight.  Severity of Illness: The appropriate patient status for this patient is INPATIENT. Inpatient status is judged to be reasonable and necessary in order to provide the required intensity of service to ensure the patient's safety. The patient's presenting symptoms, physical exam findings, and initial radiographic and laboratory data in the context of their chronic comorbidities is felt to place them at high risk for further clinical deterioration. Furthermore, it is not anticipated that the patient will be medically stable for discharge from the hospital within 2 midnights of admission. The following factors support the patient status of inpatient.   " The patient's presenting symptoms include pleuritic chest discomfort. " The worrisome physical exam findings include intractable pleuritic chest discomfort with worsening breathing.. " The initial radiographic and laboratory data are worrisome because of right-sided pleural effusion moderate inside along with 7 rib fracture. " The chronic co-morbidities  include COPD/asthma, recent upper lobectomy, lung cancer, chronic respiratory failure with hypoxia..   * I certify that at the point of admission it is my clinical judgment that the patient will require inpatient hospital care spanning beyond 2 midnights from the point of admission due to high intensity of service, high risk for further deterioration and high frequency of surveillance required.Barton Dubois MD [facility]  How to contact the [facility] Attending or Consulting provider [place] or covering provider during after hours [place], for this patient?   Check the care team in [facility] and look for a) attending/consulting [facility] provider listed and b) the [facility] team listed Log into [email or web address] and use [place]'s universal password to access. If you do not have the password, please contact the hospital operator. Locate the [facility] provider you are looking for under [facility] and page to a number that you can be directly reached. If you still have difficulty reaching the provider, please page the [facility] (Director on Call) for the Hospitalists listed on amion for assistance.  08/25/2020, 9:47 AM

## 2020-08-25 NOTE — ED Notes (Signed)
Pt ambulatory to restroom with one person assist. Pt also requesting pain medication. Dr. Josephine Cables informed via Epic secure chat.

## 2020-08-25 NOTE — ED Notes (Signed)
Pt hooked back up to the monitor and given ensure at this time. Pt requesting pain medicine and cough medicine charge nurse notified.

## 2020-08-25 NOTE — ED Notes (Signed)
Patient transported to CT 

## 2020-08-25 NOTE — Procedures (Signed)
   US guided Rt thoracentesis Loculated effusion  120 cc yellow fluid obtained Sent for labs per MD  Pt tolerated well CXR: no PTX per Dr Thornton Papas  EBL: none

## 2020-08-25 NOTE — ED Notes (Signed)
Pt going for thoracentesis - in ultrasound . Pt will be returning to this room.

## 2020-08-25 NOTE — ED Notes (Signed)
Pt returned from procedure.

## 2020-08-26 ENCOUNTER — Encounter (HOSPITAL_COMMUNITY): Payer: Medicare Other

## 2020-08-26 DIAGNOSIS — K219 Gastro-esophageal reflux disease without esophagitis: Secondary | ICD-10-CM

## 2020-08-26 DIAGNOSIS — E43 Unspecified severe protein-calorie malnutrition: Secondary | ICD-10-CM

## 2020-08-26 DIAGNOSIS — G894 Chronic pain syndrome: Secondary | ICD-10-CM

## 2020-08-26 DIAGNOSIS — Z9889 Other specified postprocedural states: Secondary | ICD-10-CM

## 2020-08-26 LAB — AMYLASE, BODY FLUID (OTHER): Amylase, Body Fluid: 53 U/L

## 2020-08-26 LAB — PH, BODY FLUID: pH, Body Fluid: 7.3

## 2020-08-26 LAB — PROTEIN, BODY FLUID (OTHER): Total Protein, Body Fluid Other: 4.3 g/dL

## 2020-08-26 LAB — CYTOLOGY - NON PAP

## 2020-08-26 MED ORDER — ADULT MULTIVITAMIN W/MINERALS CH
1.0000 | ORAL_TABLET | Freq: Every day | ORAL | Status: DC
  Administered 2020-08-26: 1 via ORAL
  Filled 2020-08-26: qty 1

## 2020-08-26 MED ORDER — DEXTROMETHORPHAN POLISTIREX ER 30 MG/5ML PO SUER: 30.0000 mg | Freq: Three times a day (TID) | ORAL | 0 refills | Status: DC | PRN

## 2020-08-26 MED ORDER — ENSURE ENLIVE PO LIQD
237.0000 mL | Freq: Three times a day (TID) | ORAL | Status: DC
  Administered 2020-08-26: 237 mL via ORAL

## 2020-08-26 NOTE — Discharge Instructions (Signed)
[facility] Stay Proper nutrition can help your body recover from illness and injury.   Foods and beverages high in protein, vitamins, and minerals help rebuild muscle loss, promote healing, & reduce fall risk.   In addition to eating healthy foods, a nutrition shake is an easy, delicious way to get the nutrition you need during and after your hospital stay  It is recommended that you continue to drink 3 bottles per day of Ensure to help with weight gain.   Tips for adding a nutrition shake into your routine: As allowed, drink one with vitamins or medications instead of water or juice Enjoy one as a tasty mid-morning or afternoon snack Drink cold or make a milkshake out of it Drink one instead of milk with cereal or snacks Use as a coffee creamer   Available at the following grocery stores and pharmacies:           * [place] [facility] [phone]            For COUPONS visit: www.ensure.com/join or [email or web address]   Suggested Substitutions Ensure Plus = Boost Plus = Carnation Breakfast Essentials = Boost Compact Ensure Active Clear = Boost Breeze Glucerna Shake = Boost Glucose Control = Carnation Breakfast Essentials SUGAR FREE  Suggestions For Increasing Calories And Protein Several small meals a day are easier to eat and digest than three large ones. Space meals about 2 to 3 hours apart to maximize comfort. Stop eating 2 to 3 hours before bed and sleep with your head elevated if gastric reflux (GERD) and heartburn are problems. Do not eat your favorite foods if you are feeling bad. Save them for when you feel good! Eat breakfast-type foods at any meal. Eggs are usually easy to eat and are great any time of the day. (The same goes for pancakes and waffles.) Eat when you feel hungry. Most people have the  greatest appetite in the morning because they have not eaten all night. If this is the best meal for you, then pile on those calories and other nutrients in the morning and at lunch. Then you can have a smaller dinner without losing total calories for the day. Eat leftovers or nutritious snacks in the afternoon and early evening to round out your day. Try homemade or commercially prepared nutrition bars and puddings, as well as calorie- and protein-rich liquid nutritional supplements. Benefits of Physical Activity Talk to your doctor about physical activity. Light or moderate physical activity can help maintain muscle and promote an appetite. Walking in the neighborhood or the local mall is a great way to get up, get out, and get moving. If you are unsteady on your feet, try walking around the dining room table. Save Room for [employer]! Drink most fluids between meals instead of with meals. (It is fine to have a sip to help swallow food at meal time.) Fluids (which usually have fewer calories and nutrients than solid food) can take up valuable space in your stomach.  Foods Recommended High-Protein Foods Milk products Add cheese to toast, crackers, sandwiches, baked potatoes, vegetables, soups, noodles, meat, and fruit. Use reduced-fat (2%) or whole milk in place of water when cooking cereal and cream soups.  Include cream sauces on vegetables and pasta. Add powdered milk to cream soups and mashed potatoes.  Eggs Have hard-cooked eggs readily available in the refrigerator. Chop and add to salads, casseroles, soups, and vegetables. Make a quick egg salad. All eggs should be well cooked to avoid the risk of harmful bacteria.  Meats, poultry, and fish Add leftover cooked meats to soups, casseroles, salads, and omelets. Make dip by mixing diced, chopped, or shredded meat with sour cream and spices.  Beans, legumes, nuts, and seeds Sprinkle nuts and seeds on cereals, fruit, and desserts such as ice  cream, pudding, and custard. Also serve nuts and seeds on vegetables, salads, and pasta. Spread peanut butter on toast, bread, English muffins, and fruit, or blend it in a milk shake. Add beans and peas to salads, soups, casseroles, and vegetable dishes.  High-Calorie Foods Butter, margarine, and  oils Melt butter or margarine over potatoes, rice, pasta, and cooked vegetables. Add melted butter or margarine into soups and casseroles and spread on bread for sandwiches before spreading sandwich spread or peanut butter. Saut or stir-fry vegetables, meats, chicken and fish such as shrimp/scallops in olive or canola oil. A variety of oils add calories and can be used to [employer], chicken, or fish.  Milk products Add whipping cream to desserts, pancakes, waffles, fruit, and hot chocolate, and fold it into soups and casseroles. Add sour cream to baked potatoes and vegetables.  Salad dressing Use regular (not low-fat or diet) mayonnaise and salad dressing on sandwiches and in dips with vegetables and fruit.   Sweets Add jelly and honey to bread and crackers. Add jam to fruit and ice cream and as a topping over cake.   Copyright 2020  [employer]. All rights reserved.

## 2020-08-26 NOTE — Progress Notes (Signed)
Discharge instructions given pt and husband verbalized understanding. Discharged via wheelchair patient already had home o2 set up.

## 2020-08-26 NOTE — Progress Notes (Signed)
Initial Nutrition Assessment  DOCUMENTATION CODES:  Severe malnutrition in context of chronic illness, Underweight  INTERVENTION:  Recommend liberalizing diet to regular.  Add Ensure Enlive po TID, each supplement provides 350 kcal and 20 grams of protein.  Add Magic cup TID with meals, each supplement provides 290 kcal and 9 grams of protein.  Add MVI with minerals daily.  NUTRITION DIAGNOSIS:  Severe Malnutrition related to chronic illness as evidenced by severe fat depletion, severe muscle depletion.  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 6/22 - thoracentesis (120 ml of yellow fluid removed)  Spoke with pt at bedside. Pt in good spirits. She reports that she eats well, whatever her husband cooks (stews, roasts, fish, chicken). She also drinks 2 Ensures at home.  She reports that she has struggled with low weight for the better part of the last 6 years. She started losing weight around then. She reports that usually stays in the 90s for weight, but has been as low as 87 lbs. Per Epic, pt's weight has been stable for the past 2 months.  On exam, pt severely depleted in most areas. Of note, pt ambulates with a walker and sometimes a wheelchair due to weakness and no muscle left in legs; this was felt on exam.  Given the above information, pt is severely malnourished in the chronic setting. Pt has likely been malnourished for quite a while.  Recommend increasing Ensures to TID from BID, adding Magic Cup TID, and  adding MVI with minerals daily. Recommend liberalizing diet to regular or at most low sodium so pt may have foods with higher fat content - secure chatted MD regarding this.  Also recommended pt drink 3 Ensures daily at home for weight gain - pt agreeable.  Medications: reviewed; EE BID, Mag-Ox BID, oxycodone BID, Robaxin in D5 TID via IV, Dilaudid PRN (given twice today)  Labs: reviewed; Glucose 114 (H)  NUTRITION - FOCUSED PHYSICAL EXAM: Flowsheet Row Most Recent Value  Orbital Region Moderate depletion  Upper Arm Region Severe depletion  Thoracic and Lumbar Region Moderate depletion  Buccal Region Severe depletion  Temple Region Moderate depletion  Clavicle Bone Region Severe depletion  Clavicle and Acromion Bone Region Severe depletion  Scapular Bone Region Severe depletion  Dorsal Hand Moderate depletion  Patellar Region Severe depletion  Anterior Thigh Region Severe depletion  Posterior Calf Region Severe depletion  Edema (RD Assessment) None  Hair Reviewed  Eyes Reviewed  Mouth Reviewed  Skin Reviewed  Nails Reviewed   Diet Order:   Diet Order             Diet Heart Room service appropriate? Yes; Fluid consistency: Thin  Diet effective now                  EDUCATION NEEDS:  Education needs have been addressed  Skin:  Skin Assessment: Skin Integrity Issues: Skin Integrity Issues:: Incisions Incisions: R chest, closed  Last BM:  PTA/unknown  Height:  Ht Readings from Last 1 Encounters:  08/24/20 5\' 3"  (1.6 m)  Weight:  Wt Readings from Last 1 Encounters:  08/24/20 43.1 kg   Ideal Body Weight:  52.3 kg  BMI:  Body mass index is 16.83 kg/m.  Estimated Nutritional Needs:  Kcal:  1900-2100 Protein:  65-80 grams Fluid:  >1.9 L  Derrel Nip, RD, LDN (she/her/hers) Registered Dietitian I After-Hours/Weekend Pager # in [place]

## 2020-08-26 NOTE — Discharge Summary (Signed)
Physician Discharge Summary  Jasmine [name] CWC:[id number] DOB: May 21, 1955 DOA: 08/24/2020  PCP: Janora Norlander, DO  Admit date: 08/24/2020 Discharge date: 08/26/2020  Time spent: 35 minutes  Recommendations for Outpatient Follow-up:  Repeat CBC to follow hemoglobin and stability. Repeat basic metabolic panel to evaluate lites renal function. Continue to follow patient response to analgesic management. Follow final results of thoracentesis fluid.  Discharge Diagnoses:  Active Problems:   Chest pain   Pleuritic chest pain   Protein-calorie malnutrition, severe Hypertension Tobacco abuse Squamous cell lung cancer Chronic respiratory failure with hypoxia/COPD and asthma Severe protein calorie malnutrition GERD  Discharge Condition: Stable and improved.  Patient discharged home with instruction to follow-up with PCP, oncology service and cardiothoracic service.  CODE STATUS: Full code.  Diet recommendation: Heart healthy diet.  Filed Weights   08/24/20 2153  Weight: 43.1 kg    History of present illness:  Jasmine [name] is a 65 y.o. 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  Patient expressed experiencing ongoing chest pain for the last 3-4 weeks after upper lobectomy on the right side, but in the last 5 days prior to admission pain has become worse and has been associated with shortness of breath and dyspnea on exertion. Patient reports no fever, no chills, no nausea, no vomiting, no dysuria, no hematuria, no melena, no hematochezia, no new focal deficits (patient with chronic right foot drop and bilateral upper extremities neuropathy).  Reports no sick contacts.   Patient is s/p vaccination  x2 with Levan Hurst and [employer] booster   ED Course: In the ED CT chest demonstrating slight pneumothorax in the apical region along with expected changes from recent surgery; positive moderate loculated pleural effusion and right side seventh rib healing fracture.  Mild troponin flat elevation, no acute ischemic changes on EKG and otherwise a stable blood work.  TRH has been consulted to further assist patient with management of her pain and to further address ongoing pleural effusion.  Initially cardiothoracic surgeon contacted with recommendations to go to [facility] for thoracentesis and further evaluation; I have discussed with Dr. Roxan Hockey (patient's cardiothoracic surgeon that performed upper lobectomy in May 26/2022).  Who recommended to perform thoracentesis to rule out infection and to continue medical management.  No need for transfer.  Hospital Course:  1-chest pain -Pleuritic in nature and associated with right-sided healing rib fracture and pleural effusion. -Recent robotic right upper lobectomy. -Status postthoracentesis that is demonstrating no acute infection -Patient reports an improvement after fluid removal and analgesics therapy. -Resume home chronic pain management and follow-up with pain clinic provider to further adjust her treatment. -Continue outpatient follow-up with cardiothoracic surgery as previously instructed.  2-chronic respiratory failure/COPD/asthma -No wheezing -Continue oxygen supplementation -Resume home bronchodilator regimen. -Delsym has been added to help with coughing spells and by that minimization of pleuritic chest discomfort.  3-history of tobacco abuse -Cessation counseling provided -Nicotine patch offered.  4-GERD -Continue PPI.  5-squamous right lung cancer -Continue outpatient follow-up with oncology service -Status post robotic lobectomy -Continue outpatient follow-up with cardiothoracic surgeon as previously  instructed.  7-history of depression/insomnia -Continue Cymbalta and trazodone.  8-hypertension -Continue treatment with Norvasc  9-severe protein calorie malnutrition -Patient instructed for good oral nutrition and 3 times a day feeding supplementation in between meals..  10-history  of chronic pain syndrome -Continue outpatient follow-up with pain management clinic -Narcotic prescription provided at discharge.  11-hyperlipidemia -Continue statins.  Procedures: Thoracentesis See below for x-ray reports.  Consultations: Cardiothoracic surgeon was curbside (Dr. Roxan Hockey). IR  Discharge Exam: Vitals:   08/26/20 0807 08/26/20 1413  BP: 135/73 136/72  Pulse:  76  Resp:  18  Temp:  97.6 F (36.4 C)  SpO2:  100%    General: Chronically ill and underweight in appearance; demonstrating good oxygen saturation on chronic supplementation.  Reporting some improvement in her chest pain.  Patient is afebrile, no nausea, no vomiting, hemodynamically stable. Cardiovascular: S1 and S2, no rubs, no gallops, no JVD. Respiratory: Positive scattered rhonchi; no wheezing, no crackles, no using accessory muscles. Abdomen: Soft, nontender, positive bowel sounds Extremities/musculoskeletal: No cyanosis or clubbing.  Patient complaining of right-sided costal pain with deep breath and coughing spells.  Discharge Instructions   Discharge Instructions     Diet - low sodium heart healthy   Complete by: As directed    Discharge instructions   Complete by: As directed    Take medications as prescribed Arrange follow-up with PCP and pain management provider. Use antitussive medications to decrease coughing spells Maintain adequate hydration   Increase activity slowly   Complete by: As directed       Allergies as of 08/26/2020       Reactions   Lyrica [pregabalin] Other (See Comments)   EXTREME SHAKING/TREMBLING   Darvon [propoxyphene]    UNSPECIFIED REACTION    Gabapentin     Extreme shaking and trembling    Augmentin [amoxicillin-pot Clavulanate] Nausea And Vomiting   Has patient had a PCN reaction causing immediate rash, facial/tongue/throat swelling, SOB or lightheadedness with hypotension:no Has patient had a PCN reaction causing severe rash involving mucus membranes or skin necrosis:No Has patient had a PCN reaction that required hospitalization:No Has patient had a PCN reaction occurring within the last 10 years:Yes--ONLY N/V If all of the above answers are "NO", then may proceed with Cephalosporin use.   Erythromycin Itching, Rash      Vibramycin [doxycycline Calcium] Itching, Rash   "[place] "        Medication List     STOP taking these medications    tizanidine 6 MG capsule Commonly known as: ZANAFLEX       TAKE these medications    albuterol 108 (90 Base) MCG/ACT inhaler Commonly known as: VENTOLIN HFA Inhale into the lungs every 6 (six) hours as needed for wheezing or shortness of breath.   amLODipine 10 MG tablet Commonly known as: NORVASC Take 1 tablet (10 mg total) by mouth daily.   atorvastatin 40 MG tablet Commonly known as: Lipitor Take 1 tablet (40 mg total) by mouth daily.   budesonide-formoterol 160-4.5 MCG/ACT inhaler Commonly known as: SYMBICORT USE 2 INHALATIONS TWICE A DAY What changed: See the new instructions.   Cholecalciferol 125 MCG (5000 UT) capsule Take 5,000 Units daily by mouth.   dextromethorphan 30 MG/5ML liquid Commonly known as: DELSYM Take 5 mLs (30 mg total) by mouth every 8 (eight) hours as needed for cough.   diclofenac sodium 1 % Gel Commonly known as: VOLTAREN Apply 1 application topically daily as needed for muscle pain.   DULoxetine 30 MG capsule Commonly known as: CYMBALTA Take 3 capsules (90 mg total) by mouth daily.   feeding supplement Liqd Take 237 mLs by mouth 2 (two) times daily between meals.   guaiFENesin 600 MG 12 hr tablet Commonly known  as: MUCINEX Take 600 mg by  mouth 2 (two) times daily.   HYDROcodone Bitartrate ER 60 MG T24a Take 60 mg by mouth daily.   HYDROcodone-acetaminophen 7.5-325 MG tablet Commonly known as: NORCO Take 1 tablet by mouth every 6 (six) hours as needed.   ipratropium-albuterol 0.5-2.5 (3) MG/3ML Soln Commonly known as: DUONEB Inhale 3 mLs into the lungs 4 (four) times daily as needed (for shortness of breath/wheezing). *May use as needed   magnesium oxide 400 (241.3 Mg) MG tablet Commonly known as: MAG-OX Take 400 mg by mouth 2 (two) times daily.   Metanx 3-90.314-2-35 MG Caps TAKE 1 TABLET BY MOUTH TWO TIMES A DAY What changed: when to take this   nicotine 14 mg/24hr patch Commonly known as: Nicoderm CQ Place 1 patch (14 mg total) onto the skin daily.   omeprazole 40 MG capsule Commonly known as: PRILOSEC Take 1 capsule (40 mg total) by mouth 2 (two) times daily. Take 1 capsule 30 mins before breakfast and evening meal.   ondansetron 8 MG disintegrating tablet Commonly known as: ZOFRAN-ODT Take 8 mg by mouth every 8 (eight) hours as needed for vomiting or nausea.   OXYGEN Inhale 3 L into the lungs See admin instructions. continuous   Poly-Iron 150 Forte 150-0.025-1 MG Caps Generic drug: Iron Polysacch Cmplx-B12-FA Take 1 capsule by mouth 2 (two) times daily. What changed: when to take this   Spiriva Respimat 2.5 MCG/ACT Aers Generic drug: Tiotropium Bromide Monohydrate USE 2 INHALATIONS DAILY What changed: See the new instructions.   traZODone 150 MG tablet Commonly known as: DESYREL TAKE 1 TO 2 TABLETS AT BEDTIME What changed: See the new instructions.       Allergies  Allergen Reactions   Lyrica [Pregabalin] Other (See Comments)    EXTREME SHAKING/TREMBLING   Darvon [Propoxyphene]     UNSPECIFIED REACTION    Gabapentin     Extreme shaking and trembling    Augmentin [Amoxicillin-Pot Clavulanate] Nausea And Vomiting    Has patient had a PCN reaction causing immediate rash,  facial/tongue/throat swelling, SOB or lightheadedness with hypotension:no Has patient had a PCN reaction causing severe rash involving mucus membranes or skin necrosis:No Has patient had a PCN reaction that required hospitalization:No Has patient had a PCN reaction occurring within the last 10 years:Yes--ONLY N/V If all of the above answers are "NO", then may proceed with Cephalosporin use.    Erythromycin Itching and Rash        Vibramycin [Doxycycline Calcium] Itching and Rash    "[place] "    Follow-up Information     Ronnie Doss M, DO. Schedule an appointment as soon as possible for a visit in 10 day(s).   Specialty: Family Medicine Contact information: [place] [address] [phone]                  The results of significant diagnostics from this hospitalization (including imaging, microbiology, ancillary and laboratory) are listed below for reference.    Significant Diagnostic Studies: DG Chest 1 View  Result Date: 08/25/2020 CLINICAL DATA:  Post RIGHT thoracentesis EXAM: CHEST  1 VIEW COMPARISON:  08/24/2020 FINDINGS: Intraspinal stimulator lead leads midthoracic spine unchanged. Normal heart size, mediastinal contours, and pulmonary vascularity. Atherosclerotic calcification aorta. Emphysematous and bronchitic changes consistent with COPD. Post surgical staple lines in the RIGHT upper lobe. Decreased RIGHT pleural effusion and basilar atelectasis since prior study post thoracentesis. Loculated effusion at RIGHT apex unchanged. No pneumothorax. Bones demineralized. IMPRESSION: No pneumothorax  following RIGHT thoracentesis. Electronically Signed   By: Lavonia Dana M.D.   On: 08/25/2020 10:50   DG Chest 2 View  Result Date: 08/24/2020 CLINICAL DATA:  Chest pain and shortness of breath. Patient reports pain since May 26 a thorough lobectomy but worse today. History of lung cancer. EXAM: CHEST - 2 VIEW COMPARISON:  Radiograph 1 week ago 08/17/2020.   Chest CT 07/21/2020 FINDINGS: Postsurgical change in the right hemithorax with chain sutures at the hilum and postsurgical volume loss. Again seen blunting of the right costophrenic angle which may be postsurgical or small effusion. Previous pneumothorax is not definitively seen on the current exam. Moderate apical thickening may represent loculated fluid or scarring. Stable heart size and mediastinal contours. Aortic atherosclerosis. Emphysema. No focal abnormality in the left lung. Spinal stimulator in place. No acute osseous abnormalities are seen. IMPRESSION: Postsurgical change in the right hemithorax. Blunting of the right costophrenic angle may be postsurgical or small effusion, no residual pneumothorax component. No acute radiographic findings. Electronically Signed   By: Keith Rake M.D.   On: 08/24/2020 22:38   DG Chest 2 View  Result Date: 08/17/2020 CLINICAL DATA:  Status post right lobectomy EXAM: CHEST - 2 VIEW COMPARISON:  08/03/2020 FINDINGS: Cardiac shadow is stable. Spinal stimulator is again noted. Left lung is well aerated and clear. Postsurgical changes are noted on the right. Previously seen hydropneumothorax has reduced significantly in the interval from the prior exam with only a small anterior component identified. No bony abnormality is noted. IMPRESSION: Postsurgical changes on the right with a tiny residual right hydropneumothorax significantly improved from the prior exam. Electronically Signed   By: Inez Catalina M.D.   On: 08/17/2020 15:59   DG Chest 2 View  Result Date: 08/03/2020 CLINICAL DATA:  65 year old female status post right upper lobectomy. Follow-up study. EXAM: CHEST - 2 VIEW COMPARISON:  Chest x-ray 08/02/2020. FINDINGS: Postoperative changes of recent right upper lobectomy redemonstrated. There continues to be a small right-sided hydropneumothorax best appreciated on the lateral projection where there is predominantly upper and anterior pneumothorax and a  small amount of fluid layering anteriorly. This is stable to slightly decreased in volume compared to yesterday's examination. Persistent opacities throughout the right middle and lower lobes likely reflect re-expansion pulmonary edema and/or residual areas of subsegmental atelectasis. Left lung is clear. No left pleural effusion. No left pneumothorax. Heart size is normal. Atherosclerotic calcifications in the thoracic aorta. Resolving subcutaneous emphysema in the right chest wall. Spinal cord stimulator in the midthoracic region incidentally noted. IMPRESSION: 1. Status post right upper lobectomy with slowly resolving right-sided hydropneumothorax, as above. 2. Aortic atherosclerosis. Electronically Signed   By: Vinnie Langton M.D.   On: 08/03/2020 06:16   DG Chest 2 View  Result Date: 08/02/2020 CLINICAL DATA:  65 year old female with history of pneumothorax. Cough. EXAM: CHEST - 2 VIEW COMPARISON:  Chest x-ray 08/01/2020. FINDINGS: Postoperative changes of right upper lobectomy are again noted, with compensatory hyperexpansion of the right middle and lower lobes. There continues to be some lucencies in the upper right hemithorax compatible with a small amount of residual right-sided pneumothorax, as well as some layering air-fluid levels, indicative of small right pleural effusion. Patchy ill-defined opacities and areas of interstitial prominence in the right lung likely reflect re-expansion pulmonary edema, or residual areas of atelectasis. Left lung is clear. No left pleural effusion. No left pneumothorax. No evidence of pulmonary edema. Heart size is normal. Upper mediastinal contours are within normal limits. Aortic atherosclerosis.  Spinal cord stimulator projecting over the midthoracic region. Subcutaneous emphysema in the right chest wall again noted. IMPRESSION: 1. Persistent small right-sided hydropneumothorax in this patient status post recent right upper lobectomy. 2. Aortic atherosclerosis.  Electronically Signed   By: Vinnie Langton M.D.   On: 08/02/2020 08:17   DG Chest 2 View  Result Date: 08/01/2020 CLINICAL DATA:  66 year old female with history of right upper lobectomy. EXAM: CHEST - 2 VIEW COMPARISON:  07/31/2020 FINDINGS: Postsurgical changes after right upper lobectomy. Slight interval decrease in trace right superolateral pneumothorax. Trace right pleural effusion. The left lung is clear. Similar-appearing extensive right axillary and neck subcutaneous emphysema. Spinal cord stimulator remains in place in unchanged position. Atherosclerotic calcification of the aortic arch. No acute osseous abnormality. IMPRESSION: 1. Resolving small right pneumothorax. 2. Persistent right axillary and supraclavicular subcutaneous emphysema. Electronically Signed   By: Ruthann Cancer MD   On: 08/01/2020 09:15   CT Angio Chest Pulmonary Embolism (PE) W or WO Contrast  Result Date: 08/25/2020 CLINICAL DATA:  PE suspected, high prob vs post op bleeding or complication EXAM: CT ANGIOGRAPHY CHEST WITH CONTRAST TECHNIQUE: Multidetector CT imaging of the chest was performed using the standard protocol during bolus administration of intravenous contrast. Multiplanar CT image reconstructions and MIPs were obtained to evaluate the vascular anatomy. CONTRAST:  188mL OMNIPAQUE IOHEXOL 350 MG/ML SOLN COMPARISON:  PET CT 04/19/2020 FINDINGS: Cardiovascular: Normal heart size. No significant pericardial effusion. The thoracic aorta is normal in caliber. At least mild atherosclerotic plaque of the thoracic aorta. At least 2 vessel coronary artery calcifications. Mediastinum/Nodes: No enlarged mediastinal, hilar, or axillary lymph nodes. Thyroid gland, trachea, and esophagus demonstrate no significant findings. Lungs/Pleura: Surgical changes status post right upper lobectomy. Interval development of a small to moderate-sized loculated basilar and apical pleural effusion. Trace right apical pneumothorax. Centrilobular  emphysematous changes. No focal consolidation. No pulmonary nodule or mass. Passive atelectasis of the right lower lobe. No left pleural effusion or pneumothorax. No findings of hemothorax. Upper Abdomen: Interval development of intra and extrahepatic biliary ductal dilatation. Grossly stable versus slightly increased main pancreatic duct dilatation. Otherwise no acute abnormality. Musculoskeletal: No chest wall abnormality. No suspicious lytic or blastic osseous lesions. No acute displaced fracture. Multilevel degenerative changes of the spine. Neural stimulator leads noted terminating within the cyst midthoracic posterior spinal canal. Review of the MIP images confirms the above findings. IMPRESSION: 1. No pulmonary embolus. 2. Surgical changes status post right upper lobectomy with interval development of a trace apical right pneumothorax and small to moderate-sized right loculated basilar and apical pleural effusion. 3. Interval development of intra and extrahepatic biliary ductal dilatation with likely slightly increased main pancreatic duct dilatation. Recommend nonemergent MRI pancreatic protocol. 4. Aortic Atherosclerosis (ICD10-I70.0) and Emphysema (ICD10-J43.9). Electronically Signed   By: Iven Finn M.D.   On: 08/25/2020 01:28   DG Chest 1V REPEAT Same Day  Result Date: 07/31/2020 CLINICAL DATA:  Chest tube removal.  Follow-up exam. EXAM: CHEST - 1 VIEW SAME DAY COMPARISON:  07/23/2020 at 5:54 a.m., and older studies. FINDINGS: Right-sided chest tube has been removed. There is a small right pneumothorax best seen along the lateral aspect of the right mid to upper hemithorax. Right lung is better expanded than the earlier study. There is mild hazy opacity associated with pulmonary anastomosis staples in the central right upper lobe, unchanged from the earlier exam. Remainder of the right lung is clear. Left lung is hyperexpanded, but clear. Right-sided subcutaneous emphysema is stable. IMPRESSION:  1.  Status post right chest tube removal. A small right pneumothorax has developed following chest tube removal. Electronically Signed   By: Lajean Manes M.D.   On: 07/31/2020 12:24   DG CHEST PORT 1 VIEW  Result Date: 07/31/2020 CLINICAL DATA:  Follow-up after right lung surgery. Chest tube in place. EXAM: PORTABLE CHEST 1 VIEW COMPARISON:  07/30/2020 and older studies. FINDINGS: Postsurgical changes on the right are stable with right Peri/suprahilar pulmonary anastomosis staples and hazy opacity in the right upper lung bordering the minor fissure. Right-sided chest tube tip is stable, projecting at the right apex. Left lung is hyperexpanded, but clear. Right anterolateral chest wall and neck base subcutaneous emphysema is without significant change from the previous day's study. IMPRESSION: 1. No significant change from the previous day's exam. 2. Stable changes from right lung surgery. Stable right chest tube. No pneumothorax. 3. Subcutaneous emphysema along the right anterolateral chest wall and right neck base. Electronically Signed   By: Lajean Manes M.D.   On: 07/31/2020 08:31   DG CHEST PORT 1 VIEW  Result Date: 07/30/2020 CLINICAL DATA:  Chest tube, post lobectomy EXAM: PORTABLE CHEST 1 VIEW COMPARISON:  Portable exam 0619 hours compared to 07/29/2020 FINDINGS: RIGHT thoracostomy tube and intraspinal stimulator unchanged. Normal heart size, mediastinal contours, and pulmonary vascularity. Atherosclerotic calcification aorta. Emphysematous changes with postsurgical changes of the RIGHT upper lobe. Mild atelectasis versus infiltrate RIGHT upper lobe. Remaining lungs clear. No definite pleural effusion or pneumothorax. Increased RIGHT chest wall emphysema extending into RIGHT cervical region. IMPRESSION: Postsurgical changes RIGHT upper lobe with atelectasis versus infiltrate. Underlying COPD. Aortic Atherosclerosis (ICD10-I70.0) and Emphysema (ICD10-J43.9). Electronically Signed   By: Lavonia Dana  M.D.   On: 07/30/2020 08:17   DG Chest Port 1 View  Result Date: 07/29/2020 CLINICAL DATA:  Lobectomy.  Chest tube. EXAM: PORTABLE CHEST 1 VIEW COMPARISON:  07/27/2020 FINDINGS: Interval lobectomy on the right. Chest tube is in place. Small amount of pleural air at the apex. No unexpected pneumothorax. Left chest remains clear. Aortic atherosclerosis as seen previously. IMPRESSION: Status post lobectomy on the right. Chest tube is in place. Small amount of expected pleural air. Electronically Signed   By: Nelson Chimes M.D.   On: 07/29/2020 14:26   DG ABD ACUTE 2+V W 1V CHEST  Result Date: 08/06/2020 CLINICAL DATA:  Recent right lung surgery.  Nausea vomiting.  Pain EXAM: DG ABDOMEN ACUTE WITH 1 VIEW CHEST COMPARISON:  Chest two-view 08/03/2020 FINDINGS: Right upper lobectomy. No pneumothorax. Small right effusion and mild right lower lobe atelectasis COPD.  Left lung clear. Spinal cord stimulator midthoracic spine unchanged. Normal bowel gas pattern.  No obstruction or free air. Pedicle screw fusion L4-5 and L5-S1.  No acute skeletal abnormality. IMPRESSION: Postop right lobectomy. No pneumothorax. Mild right lower lobe atelectasis and small right effusion Normal bowel gas pattern. Electronically Signed   By: Franchot Gallo M.D.   On: 08/06/2020 15:45   US THORACENTESIS ASP PLEURAL SPACE W/IMG GUIDE  Result Date: 08/25/2020 INDICATION: Post robotic RUL lobectomy 07/29/2020 Loculated pleural effusion EXAM: ULTRASOUND GUIDED Right THORACENTESIS MEDICATIONS: 10 cc 1% lidocaine. COMPLICATIONS: None immediate. PROCEDURE: An ultrasound guided thoracentesis was thoroughly discussed with the patient and questions answered. The benefits, risks, alternatives and complications were also discussed. The patient understands and wishes to proceed with the procedure. Written consent was obtained. Ultrasound was performed to localize and mark an adequate pocket of fluid in the right chest. The area was then prepped and  draped in the  normal sterile fashion. 1% Lidocaine was used for local anesthesia. Under ultrasound guidance a Yueh catheter was introduced. Thoracentesis was performed. The catheter was removed and a dressing applied. FINDINGS: A total of approximately 120 cc of yellow fluid was removed. Samples were sent to the laboratory as requested by the clinical team. IMPRESSION: Successful ultrasound guided right thoracentesis yielding 120 cc of pleural fluid. Post CXR show NO PTX per Dr Thornton Papas Read by Lavonia [facility] Electronically Signed   By: Lavonia Dana M.D.   On: 08/25/2020 10:44    Microbiology: Recent Results (from the past 240 hour(s))  Resp Panel by RT-PCR (Flu A&B, Covid) Nasopharyngeal Swab     Status: None   Collection Time: 08/24/20 10:09 PM   Specimen: Nasopharyngeal Swab; Nasopharyngeal(NP) swabs in vial transport medium  Result Value Ref Range Status   SARS Coronavirus 2 by RT PCR NEGATIVE NEGATIVE Final    Comment: (NOTE) SARS-CoV-2 target nucleic acids are NOT DETECTED.  The SARS-CoV-2 RNA is generally detectable in upper respiratory specimens during the acute phase of infection. The lowest concentration of SARS-CoV-2 viral copies this assay can detect is 138 copies/mL. A negative result does not preclude SARS-Cov-2 infection and should not be used as the sole basis for treatment or other patient management decisions. A negative result may occur with  improper specimen collection/handling, submission of specimen other than nasopharyngeal swab, presence of viral mutation(s) within the areas targeted by this assay, and inadequate number of viral copies(<138 copies/mL). A negative result must be combined with clinical observations, patient history, and epidemiological information. The expected result is Negative.  Fact Sheet for Patients:  EntrepreneurPulse.com.au  Fact Sheet for Healthcare Providers:  IncredibleEmployment.be  This test is no  t yet approved or cleared by the [place] FDA and  has been authorized for detection and/or diagnosis of SARS-CoV-2 by FDA under an Emergency Use Authorization (EUA). This EUA will remain  in effect (meaning this test can be used) for the duration of the COVID-19 declaration under Section 564(b)(1) of the Act, 21 U.S.C.section 360bbb-3(b)(1), unless the authorization is terminated  or revoked sooner.       Influenza A by PCR NEGATIVE NEGATIVE Final   Influenza B by PCR NEGATIVE NEGATIVE Final    Comment: (NOTE) The Xpert Xpress SARS-CoV-2/FLU/RSV plus assay is intended as an aid in the diagnosis of influenza from Nasopharyngeal swab specimens and should not be used as a sole basis for treatment. Nasal washings and aspirates are unacceptable for Xpert Xpress SARS-CoV-2/FLU/RSV testing.  Fact Sheet for Patients: EntrepreneurPulse.com.au  Fact Sheet for Healthcare Providers: IncredibleEmployment.be  This test is not yet approved or cleared by the [place] FDA and has been authorized for detection and/or diagnosis of SARS-CoV-2 by FDA under an Emergency Use Authorization (EUA). This EUA will remain in effect (meaning this test can be used) for the duration of the COVID-19 declaration under Section 564(b)(1) of the Act, 21 U.S.C. section 360bbb-3(b)(1), unless the authorization is terminated or revoked.  Performed at [facility], [address] [place], [place] [address]   Gram stain     Status: None   Collection Time: 08/25/20 10:30 AM   Specimen: Pleura  Result Value Ref Range Status   Specimen Description PLEURAL  Final   Special Requests NONE  Final   Gram Stain   Final    NO ORGANISMS SEEN WBC SEEN CYTOSPIN SMEAR Performed at [facility], [address], [place],  [address]    Report Status  08/25/2020 FINAL  Final  Culture, body fluid w Gram Stain-bottle     Status: None (Preliminary result)   Collection Time:  08/25/20 10:32 AM   Specimen: Pleura  Result Value Ref Range Status   Specimen Description PLEURAL RIGHT LUNG  Final   Special Requests   Final    BOTTLES DRAWN AEROBIC AND ANAEROBIC Blood Culture adequate volume Performed at [facility], [address] [place], [place] [address]    Culture PENDING  Incomplete   Report Status PENDING  Incomplete     Labs: Basic Metabolic Panel: Recent Labs  Lab 08/24/20 2205 08/25/20 0357  NA 137 137  K 3.7 4.4  CL 104 102  CO2 22 26  GLUCOSE 116* 114*  BUN 30* 28*  CREATININE 0.48 0.41*  CALCIUM 8.9 9.4  MG  --  1.9  PHOS  --  4.4   Liver Function Tests: Recent Labs  Lab 08/25/20 0357  AST 24  ALT 19  ALKPHOS 78  BILITOT 0.5  PROT 6.8  ALBUMIN 3.6   CBC: Recent Labs  Lab 08/24/20 2205 08/25/20 0357  WBC 7.2 5.3  HGB 9.8* 10.9*  HCT 31.9* 35.6*  MCV 108.1* 106.9*  PLT 228 231    Signed:  Barton Dubois MD.  [facility] 08/26/2020, 5:00 PM

## 2020-08-27 ENCOUNTER — Telehealth: Payer: Self-pay | Admitting: Family Medicine

## 2020-08-27 ENCOUNTER — Telehealth: Payer: Self-pay

## 2020-08-27 NOTE — Telephone Encounter (Signed)
Transition Care Management Unsuccessful Follow-up Telephone Call  Date of discharge and from where:  [facility]   Diagnosis: pleuritic chest pain   Attempts:  1st Attempt  Reason for unsuccessful TCM follow-up call:  Left voice message on home number

## 2020-08-27 NOTE — Telephone Encounter (Signed)
Please call patient to schedule hospital follow up with Dr Lajuana Ripple. Patient needs afternoon appt, asap.

## 2020-08-27 NOTE — Telephone Encounter (Signed)
Transition Care Management Unsuccessful Follow-up Telephone Call  Date of discharge and from where:  08/26/20 [name]   Diagnosis: pleuritic chest pain   Attempts:  2nd Attempt  Reason for unsuccessful TCM follow-up call:  Unable to leave message voice mail full and doesn't ring on cell

## 2020-08-30 ENCOUNTER — Other Ambulatory Visit: Payer: Self-pay | Admitting: Family Medicine

## 2020-08-30 ENCOUNTER — Ambulatory Visit (INDEPENDENT_AMBULATORY_CARE_PROVIDER_SITE_OTHER): Payer: Medicare Other | Admitting: Family Medicine

## 2020-08-30 ENCOUNTER — Encounter: Payer: Self-pay | Admitting: Family Medicine

## 2020-08-30 ENCOUNTER — Other Ambulatory Visit: Payer: Self-pay

## 2020-08-30 VITALS — BP 141/87 | HR 103 | Temp 97.6°F | Ht 63.0 in | Wt 96.6 lb

## 2020-08-30 DIAGNOSIS — Z09 Encounter for follow-up examination after completed treatment for conditions other than malignant neoplasm: Secondary | ICD-10-CM

## 2020-08-30 DIAGNOSIS — E440 Moderate protein-calorie malnutrition: Secondary | ICD-10-CM

## 2020-08-30 DIAGNOSIS — Z9889 Other specified postprocedural states: Secondary | ICD-10-CM

## 2020-08-30 DIAGNOSIS — Z902 Acquired absence of lung [part of]: Secondary | ICD-10-CM

## 2020-08-30 DIAGNOSIS — M8000XA Age-related osteoporosis with current pathological fracture, unspecified site, initial encounter for fracture: Secondary | ICD-10-CM

## 2020-08-30 DIAGNOSIS — J90 Pleural effusion, not elsewhere classified: Secondary | ICD-10-CM

## 2020-08-30 LAB — CULTURE, BODY FLUID W GRAM STAIN -BOTTLE
Culture: NO GROWTH
Special Requests: ADEQUATE

## 2020-08-30 MED ORDER — ENSURE HIGH PROTEIN PO LIQD
ORAL | 99 refills | Status: DC
Start: 2020-08-30 — End: 2022-06-17

## 2020-08-30 NOTE — Progress Notes (Signed)
Subjective: CC: Hospital discharge follow-up PCP: Janora Norlander, DO SNK:[id number] J[facility] [name] is a 65 y.o. female presenting to clinic today for:  1.  Hospital discharge follow-up for pleuritic chest pain Patient was seen in the emergency department and CAT scan was obtained which demonstrated slight pneumothorax in the apical region along with a moderate loculated pleural effusion and a healing right rib fracture.  Her troponins were noted to be mildly elevated but there were no ischemic changes on EKG.  She was admitted for ongoing pleural effusion and a thoracentesis was performed, this ultimately did improve her symptomology.  She was discharged on home chronic pain management and follow-up with me for recheck.  She is also to follow-up with cardiothoracic surgery.  At discharge from the hospital her hemoglobin was noted to be 10.9, white blood cell 5.3, normal platelet level, normal kidney function, liver enzymes and electrolytes.  Body fluid culture was negative for evidence of infection.  Overall she continues to feel better after thoracentesis.  She has follow-up scheduled with her pain management doctor (who did change her pain medications to OxyContin and oxycodone.  She reports good pain relief with this), pulmonology and cardiothoracic surgery.  She has been drinking ensures 3 times per day and trying to change the frequency of meals such that she is getting more calories throughout the day.  Doing well on as needed albuterol.  Unfortunately Xopenex was not covered  2.  Osteoporosis Noted on previous DEXA scan and recently noted to have a healing rib fracture.  This is considered a pathologic rib fracture.  She takes vitamin D daily.  She worked with nutrition as above.  Has plans for dental extraction with implants this year.   ROS: Per HPI  Allergies  Allergen Reactions   Lyrica [Pregabalin] Other (See Comments)    EXTREME SHAKING/TREMBLING   Darvon [Propoxyphene]      UNSPECIFIED REACTION    Gabapentin     Extreme shaking and trembling    Augmentin [Amoxicillin-Pot Clavulanate] Nausea And Vomiting    Has patient had a PCN reaction causing immediate rash, facial/tongue/throat swelling, SOB or lightheadedness with hypotension:no Has patient had a PCN reaction causing severe rash involving mucus membranes or skin necrosis:No Has patient had a PCN reaction that required hospitalization:No Has patient had a PCN reaction occurring within the last 10 years:Yes--ONLY N/V If all of the above answers are "NO", then may proceed with Cephalosporin use.    Erythromycin Itching and Rash        Vibramycin [Doxycycline Calcium] Itching and Rash    "[place] "   Past Medical History:  Diagnosis Date   Allergy    Anemia    Anxiety    Arthritis    Asthma    Carpal tunnel syndrome    Chronic back pain    Cigarette smoker 3/41/9379   Complication of anesthesia    in the past has had N/V, the last few surgeries she has not been   COPD (chronic obstructive pulmonary disease) ([place])    Easy bruising 07/10/2016   GERD (gastroesophageal reflux disease)    Headache    Heart murmur 2005   never had any problems   History of blood transfusion 2018   History of kidney stones    Hypertension    Hypoglycemia    Hypoglycemia    Iron deficiency anemia 07/11/2016   Neuropathy    both arms   Normocytic anemia 07/29/2015   Pancreatitis    Pneumonia  PONV (postoperative nausea and vomiting)    Postoperative anemia due to acute blood loss 11/15/2015   R lung cancer    Sciatica    Seizures ([place])    as a child when she would have an asthma attack. none as an adult   Shortness of breath dyspnea     Current Outpatient Medications:    albuterol (VENTOLIN HFA) 108 (90 Base) MCG/ACT inhaler, Inhale into the lungs every 6 (six) hours as needed for wheezing or shortness of breath., Disp: , Rfl:    amLODipine (NORVASC) 10 MG tablet, Take 1 tablet (10 mg total) by mouth  daily., Disp: 90 tablet, Rfl: 3   atorvastatin (LIPITOR) 40 MG tablet, Take 1 tablet (40 mg total) by mouth daily., Disp: 90 tablet, Rfl: 3   budesonide-formoterol (SYMBICORT) 160-4.5 MCG/ACT inhaler, USE 2 INHALATIONS TWICE A DAY, Disp: 20.4 g, Rfl: 2   Cholecalciferol 125 MCG (5000 UT) capsule, Take 5,000 Units daily by mouth., Disp: , Rfl:    dextromethorphan (DELSYM) 30 MG/5ML liquid, Take 5 mLs (30 mg total) by mouth every 8 (eight) hours as needed for cough., Disp: 148 mL, Rfl: 0   diclofenac sodium (VOLTAREN) 1 % GEL, Apply 1 application topically daily as needed for muscle pain., Disp: , Rfl:    DULoxetine (CYMBALTA) 30 MG capsule, Take 3 capsules (90 mg total) by mouth daily., Disp: 270 capsule, Rfl: 4   feeding supplement, ENSURE ENLIVE, (ENSURE ENLIVE) LIQD, Take 237 mLs by mouth 2 (two) times daily between meals., Disp: 237 mL, Rfl: 12   guaiFENesin (MUCINEX) 600 MG 12 hr tablet, Take 600 mg by mouth 2 (two) times daily., Disp: , Rfl:    HYDROcodone Bitartrate ER 60 MG T24A, Take 60 mg by mouth daily., Disp: , Rfl:    HYDROcodone-acetaminophen (NORCO) 7.5-325 MG tablet, Take 1 tablet by mouth every 6 (six) hours as needed., Disp: , Rfl:    ipratropium-albuterol (DUONEB) 0.5-2.5 (3) MG/3ML SOLN, Inhale 3 mLs into the lungs 4 (four) times daily as needed (for shortness of breath/wheezing). *May use as needed, Disp: , Rfl:    Iron Polysacch Cmplx-B12-FA (POLY-IRON 150 FORTE) 150-0.025-1 MG CAPS, Take 1 capsule by mouth 2 (two) times daily., Disp: 60 capsule, Rfl: 2   L-Methylfolate-Algae-B12-B6 (METANX) 3-90.314-2-35 MG CAPS, TAKE 1 TABLET BY MOUTH TWO TIMES A DAY, Disp: 180 capsule, Rfl: 4   magnesium oxide (MAG-OX) 400 (241.3 Mg) MG tablet, Take 400 mg by mouth 2 (two) times daily., Disp: , Rfl:    nicotine (NICODERM CQ) 14 mg/24hr patch, Place 1 patch (14 mg total) onto the skin daily., Disp: 30 patch, Rfl: 0   omeprazole (PRILOSEC) 40 MG capsule, Take 1 capsule (40 mg total) by mouth 2  (two) times daily. Take 1 capsule 30 mins before breakfast and evening meal., Disp: 90 capsule, Rfl: 3   ondansetron (ZOFRAN-ODT) 8 MG disintegrating tablet, Take 8 mg by mouth every 8 (eight) hours as needed for vomiting or nausea., Disp: , Rfl:    OXYGEN, Inhale 3 L into the lungs See admin instructions. continuous, Disp: , Rfl:    SPIRIVA RESPIMAT 2.5 MCG/ACT AERS, USE 2 INHALATIONS DAILY, Disp: 12 g, Rfl: 3   traZODone (DESYREL) 150 MG tablet, TAKE 1 TO 2 TABLETS AT BEDTIME, Disp: 180 tablet, Rfl: 3 Social History   Socioeconomic History   Marital status: Married    Spouse name: [name]   Number of children: 1   Years of education: Not on file   Highest  education level: Some college, no degree  Occupational History   Not on file  Tobacco Use   Smoking status: Every Day    Packs/day: 0.25    Years: 35.00    Pack years: 8.75    Types: Cigarettes   Smokeless tobacco: Never   Tobacco comments:    trying to quit  Vaping Use   Vaping Use: Never used  Substance and Sexual Activity   Alcohol use: Yes    Alcohol/week: 2.0 standard drinks    Types: 2 Glasses of wine per week    Comment: occasionally   Drug use: No   Sexual activity: Yes    Birth control/protection: None  Other Topics Concern   Not on file  Social History Narrative   Retired, 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 1 son, deceased. 2 pet dogs. Enjoys watching TV.   Social Determinants of Health   Financial Resource Strain: Not on file  Food Insecurity: Not on file  Transportation Needs: Not on file  Physical Activity: Not on file  Stress: Not on file  Social Connections: Not on file  Intimate Partner Violence: Not At Risk   Fear of Current or Ex-Partner: No   Emotionally Abused: No   Physically Abused: No   Sexually Abused: No   Family History  Problem Relation Age of Onset   Heart disease Mother    Diabetes Mother    Hypertension Mother    Hyperlipidemia Mother    COPD Mother        smoked    Cancer Mother        bile duct   COPD Father    Diabetes Father    Heart disease Father    Cancer Father        throat   Dementia Father    Asthma Son    Hypertension Sister    Hyperlipidemia Sister    Hypertension Brother    Hyperlipidemia Brother    Hypertension Brother    Hyperlipidemia Brother    Emphysema Maternal Grandmother        smoked   Colon cancer Neg Hx    Colon polyps Neg Hx     Objective: Office vital signs reviewed. BP (!) 141/87   Pulse (!) 103   Temp 97.6 F (36.4 Jasmine)   Ht 5\' 3"  (1.6 m)   Wt 96 lb 9.6 oz (43.8 kg)   SpO2 100%   BMI 17.11 kg/m   Physical Examination:  General: Awake, alert, underweight, No acute distress HEENT: Normal, sclera white Cardio: regular rate and rhythm, S1S2 heard, no murmurs appreciated Pulm: Mild wheezing noted in the right midlung field.  No rhonchi, rales.  She is breathing normally on 3 L supplemental oxygen. MSK: Thin female.  Ambulating with use of rolling walker. Neuro: Mentation clear  Recent Results (from the past 240 hour(s))  Resp Panel by RT-PCR (Flu A&B, Covid) Nasopharyngeal Swab     Status: None   Collection Time: 08/24/20 10:09 PM   Specimen: Nasopharyngeal Swab; Nasopharyngeal(NP) swabs in vial transport medium  Result Value Ref Range Status   SARS Coronavirus 2 by RT PCR NEGATIVE NEGATIVE Final    Comment: (NOTE) SARS-CoV-2 target nucleic acids are NOT DETECTED.  The SARS-CoV-2 RNA is generally detectable in upper respiratory specimens during the acute phase of infection. The lowest concentration of SARS-CoV-2 viral copies this assay can detect is 138 copies/mL. A negative result does not preclude SARS-Cov-2 infection and should not be  used as the sole basis for treatment or other patient management decisions. A negative result may occur with  improper specimen collection/handling, submission of specimen other than nasopharyngeal swab, presence of viral mutation(s) within the areas targeted by  this assay, and inadequate number of viral copies(<138 copies/mL). A negative result must be combined with clinical observations, patient history, and epidemiological information. The expected result is Negative.  Fact Sheet for Patients:  EntrepreneurPulse.com.au  Fact Sheet for Healthcare Providers:  IncredibleEmployment.be  This test is no t yet approved or cleared by the [place] FDA and  has been authorized for detection and/or diagnosis of SARS-CoV-2 by FDA under an Emergency Use Authorization (EUA). This EUA will remain  in effect (meaning this test can be used) for the duration of the COVID-19 declaration under Section 564(b)(1) of the Act, 21 U.S.[place].section 360bbb-3(b)(1), unless the authorization is terminated  or revoked sooner.       Influenza A by PCR NEGATIVE NEGATIVE Final   Influenza B by PCR NEGATIVE NEGATIVE Final    Comment: (NOTE) The Xpert Xpress SARS-CoV-2/FLU/RSV plus assay is intended as an aid in the diagnosis of influenza from Nasopharyngeal swab specimens and should not be used as a sole basis for treatment. Nasal washings and aspirates are unacceptable for Xpert Xpress SARS-CoV-2/FLU/RSV testing.  Fact Sheet for Patients: EntrepreneurPulse.com.au  Fact Sheet for Healthcare Providers: IncredibleEmployment.be  This test is not yet approved or cleared by the [place] FDA and has been authorized for detection and/or diagnosis of SARS-CoV-2 by FDA under an Emergency Use Authorization (EUA). This EUA will remain in effect (meaning this test can be used) for the duration of the COVID-19 declaration under Section 564(b)(1) of the Act, 21 U.S.[place]asmine. section 360bbb-3(b)(1), unless the authorization is terminated or revoked.  Performed at [facility], [address] [place], [place] [address]   Gram stain     Status: None   Collection Time: 08/25/20 10:30 AM   Specimen: Pleura   Result Value Ref Range Status   Specimen Description PLEURAL  Final   Special Requests NONE  Final   Gram Stain   Final    NO ORGANISMS SEEN WBC SEEN CYTOSPIN SMEAR Performed at [facility], [address] [place], [place] [address]    Report Status 08/25/2020 FINAL  Final  Culture, body fluid w Gram Stain-bottle     Status: None   Collection Time: 08/25/20 10:32 AM   Specimen: Pleura  Result Value Ref Range Status   Specimen Description PLEURAL RIGHT LUNG  Final   Special Requests   Final    BOTTLES DRAWN AEROBIC AND ANAEROBIC Blood Culture adequate volume   Culture   Final    NO GROWTH 5 DAYS Performed at [facility], [address], [place], [place] [address]    Report Status 08/30/2020 FINAL  Final    Assessment/ Plan: 65 y.o. female   Hospital discharge follow-up  Pleural effusion on right - Plan: Basic Metabolic Panel, CBC  S/P thoracentesis - Plan: Basic Metabolic Panel, CBC  S/P lobectomy of lung - Plan: Basic Metabolic Panel, CBC  Age-related osteoporosis with current pathological fracture, initial encounter  Malnutrition of moderate degree ([place])  I reviewed her discharge summary and recommendations.  Repeat labs have been ordered.  Will CC chart to her specialist, whom she will be seeing in the next couple of weeks  Clinically seems to be breathing much better after thoracentesis and transition over to chronic O2.  Keep appoint with pulmonology.  We  discussed that the rib fracture noted is considered pathologic and likely reflective of new diagnosis of osteoporosis.  I would like to start bisphosphonates on this patient but she has upcoming dental extractions with plan for implants.  Given the risk of osteonecrosis of the jaw we will hold off on bisphosphonates but I have given her information on weight bearing exercise, adequate vitamin D and calcium.  She is also working with a nutritionist to put on weight.  No orders of the defined types were placed in  this encounter.  No orders of the defined types were placed in this encounter.   Today's visit is for Transitional Care Management.  The patient [facility] with a primary diagnosis of pleuritic chest pain.   Contact with the patient and/or caregiver, by a clinical staff member, was made on 08/27/20 and was documented as a telephone encounter within the EMR.  Through chart review and discussion with the patient I have determined that management of their condition is of moderate complexity.    Janora Norlander, DO [place] [phone]

## 2020-08-30 NOTE — Patient Instructions (Addendum)
Vit D level was on the low end of normal in December.   We talked about bisphosphonates for treatment of your osteoporosis.  I think that this is worth starting as soon as possible but I do want to get any dental procedures you are planning prior to initiation, as there is an increased risk of osteonecrosis of the jaw with these particular medications.  Your body fluid cultures showed no infection.

## 2020-08-31 ENCOUNTER — Encounter (HOSPITAL_COMMUNITY): Payer: Medicare Other

## 2020-08-31 LAB — CBC
Hematocrit: 33.6 % — ABNORMAL LOW (ref 34.0–46.6)
Hemoglobin: 11.1 g/dL (ref 11.1–15.9)
MCH: 32.4 pg (ref 26.6–33.0)
MCHC: 33 g/dL (ref 31.5–35.7)
MCV: 98 fL — ABNORMAL HIGH (ref 79–97)
Platelets: 271 10*3/uL (ref 150–450)
RBC: 3.43 x10E6/uL — ABNORMAL LOW (ref 3.77–5.28)
RDW: 12.9 % (ref 11.7–15.4)
WBC: 9 10*3/uL (ref 3.4–10.8)

## 2020-08-31 LAB — BASIC METABOLIC PANEL
BUN/Creatinine Ratio: 55 — ABNORMAL HIGH (ref 12–28)
BUN: 22 mg/dL (ref 8–27)
CO2: 24 mmol/L (ref 20–29)
Calcium: 9.6 mg/dL (ref 8.7–10.3)
Chloride: 97 mmol/L (ref 96–106)
Creatinine, Ser: 0.4 mg/dL — ABNORMAL LOW (ref 0.57–1.00)
Glucose: 111 mg/dL — ABNORMAL HIGH (ref 65–99)
Potassium: 5 mmol/L (ref 3.5–5.2)
Sodium: 138 mmol/L (ref 134–144)
eGFR: 108 mL/min/{1.73_m2} (ref >59)

## 2020-09-01 NOTE — Telephone Encounter (Signed)
Transition Care Management Unsuccessful Follow-up Telephone Call  Date of discharge and from where:  [facility]  Diagnosis:  pleuritic chest pain   Attempts:  3rd Attempt  Reason for unsuccessful TCM follow-up call:  Unable to leave message  Unable to reach patient for TCM

## 2020-09-02 ENCOUNTER — Encounter (HOSPITAL_COMMUNITY): Payer: Medicare Other

## 2020-09-07 ENCOUNTER — Encounter (HOSPITAL_COMMUNITY): Payer: Medicare Other

## 2020-09-08 ENCOUNTER — Other Ambulatory Visit (HOSPITAL_COMMUNITY): Payer: Medicare Other

## 2020-09-08 ENCOUNTER — Ambulatory Visit (HOSPITAL_COMMUNITY): Payer: Medicare Other | Admitting: Hematology and Oncology

## 2020-09-08 ENCOUNTER — Inpatient Hospital Stay (HOSPITAL_COMMUNITY): Payer: Medicare Other

## 2020-09-08 ENCOUNTER — Ambulatory Visit (HOSPITAL_COMMUNITY): Payer: Medicare Other | Admitting: Physician Assistant

## 2020-09-09 ENCOUNTER — Encounter (HOSPITAL_COMMUNITY): Payer: Medicare Other

## 2020-09-11 ENCOUNTER — Encounter (HOSPITAL_COMMUNITY): Payer: Self-pay | Admitting: *Deleted

## 2020-09-11 ENCOUNTER — Emergency Department (HOSPITAL_COMMUNITY)
Admission: EM | Admit: 2020-09-11 | Discharge: 2020-09-12 | Disposition: A | Discharge status: Home / Self Care | Payer: Medicare Other | Attending: Emergency Medicine | Admitting: Emergency Medicine

## 2020-09-11 ENCOUNTER — Emergency Department (HOSPITAL_COMMUNITY): Payer: Medicare Other

## 2020-09-11 DIAGNOSIS — R519 Headache, unspecified: Secondary | ICD-10-CM | POA: Insufficient documentation

## 2020-09-11 DIAGNOSIS — R1013 Epigastric pain: Secondary | ICD-10-CM | POA: Diagnosis not present

## 2020-09-11 DIAGNOSIS — I10 Essential (primary) hypertension: Secondary | ICD-10-CM | POA: Insufficient documentation

## 2020-09-11 DIAGNOSIS — R112 Nausea with vomiting, unspecified: Secondary | ICD-10-CM | POA: Diagnosis present

## 2020-09-11 DIAGNOSIS — Z85118 Personal history of other malignant neoplasm of bronchus and lung: Secondary | ICD-10-CM | POA: Insufficient documentation

## 2020-09-11 DIAGNOSIS — J45909 Unspecified asthma, uncomplicated: Secondary | ICD-10-CM | POA: Insufficient documentation

## 2020-09-11 DIAGNOSIS — J449 Chronic obstructive pulmonary disease, unspecified: Secondary | ICD-10-CM | POA: Insufficient documentation

## 2020-09-11 DIAGNOSIS — F17210 Nicotine dependence, cigarettes, uncomplicated: Secondary | ICD-10-CM | POA: Diagnosis not present

## 2020-09-11 LAB — COMPREHENSIVE METABOLIC PANEL
ALT: 21 U/L (ref 0–44)
AST: 22 U/L (ref 15–41)
Albumin: 4.2 g/dL (ref 3.5–5.0)
Alkaline Phosphatase: 100 U/L (ref 38–126)
Anion gap: 9 (ref 5–15)
BUN: 9 mg/dL (ref 8–23)
CO2: 31 mmol/L (ref 22–32)
Calcium: 9.6 mg/dL (ref 8.9–10.3)
Chloride: 95 mmol/L — ABNORMAL LOW (ref 98–111)
Creatinine, Ser: 0.4 mg/dL — ABNORMAL LOW (ref 0.44–1.00)
GFR, Estimated: >60 mL/min (ref >60)
Glucose, Bld: 129 mg/dL — ABNORMAL HIGH (ref 70–99)
Potassium: 3.9 mmol/L (ref 3.5–5.1)
Sodium: 135 mmol/L (ref 135–145)
Total Bilirubin: 0.6 mg/dL (ref 0.3–1.2)
Total Protein: 8.5 g/dL — ABNORMAL HIGH (ref 6.5–8.1)

## 2020-09-11 LAB — CBC
HCT: 43.6 % (ref 36.0–46.0)
Hemoglobin: 13.5 g/dL (ref 12.0–15.0)
MCH: 32.5 pg (ref 26.0–34.0)
MCHC: 31 g/dL (ref 30.0–36.0)
MCV: 105.1 fL — ABNORMAL HIGH (ref 80.0–100.0)
Platelets: 347 10*3/uL (ref 150–400)
RBC: 4.15 MIL/uL (ref 3.87–5.11)
RDW: 14 % (ref 11.5–15.5)
WBC: 8.1 10*3/uL (ref 4.0–10.5)
nRBC: 0 % (ref 0.0–0.2)

## 2020-09-11 LAB — LIPASE, BLOOD: Lipase: 20 U/L (ref 11–51)

## 2020-09-11 LAB — TROPONIN I (HIGH SENSITIVITY): Troponin I (High Sensitivity): 7 ng/L (ref <18)

## 2020-09-11 MED ORDER — PROCHLORPERAZINE EDISYLATE 10 MG/2ML IJ SOLN
10.0000 mg | Freq: Once | INTRAMUSCULAR | Status: AC
  Administered 2020-09-11: 10 mg via INTRAVENOUS
  Filled 2020-09-11: qty 2

## 2020-09-11 MED ORDER — DIPHENHYDRAMINE HCL 50 MG/ML IJ SOLN
25.0000 mg | Freq: Once | INTRAMUSCULAR | Status: AC
  Administered 2020-09-11: 25 mg via INTRAVENOUS
  Filled 2020-09-11: qty 1

## 2020-09-11 MED ORDER — SODIUM CHLORIDE 0.9 % IV BOLUS
1000.0000 mL | Freq: Once | INTRAVENOUS | Status: AC
  Administered 2020-09-11: 1000 mL via INTRAVENOUS

## 2020-09-11 MED ORDER — ONDANSETRON HCL 4 MG/2ML IJ SOLN
4.0000 mg | Freq: Once | INTRAMUSCULAR | Status: AC | PRN
  Administered 2020-09-11: 4 mg via INTRAVENOUS
  Filled 2020-09-11: qty 2

## 2020-09-11 NOTE — ED Provider Notes (Signed)
[facility] Emergency Department Provider Note MRN:  [id number]  Arrival date & time: 09/12/20     Chief Complaint   Nausea   History of Present Illness   Jasmine [name] is a 65 y.o. year-old female with a history of COPD, lung cancer presenting to the ED with chief complaint of nausea.  Persistent nausea all day today with multiple episodes of nonbloody nonbilious emesis.  Associated with epigastric abdominal pain which is moderate to severe.  Throughout the day also experiencing dull frontal headaches which are becoming more and more severe.  Denies diarrhea, no chest pain or shortness of breath other than her chronic right-sided chest pain related to recent lung resection.  No lower abdominal pain, no numbness or weakness to the arms or legs.  Denies fever.  Review of Systems  A complete 10 system review of systems was obtained and all systems are negative except as noted in the HPI and PMH.   Patient's Health History    Past Medical History:  Diagnosis Date   Allergy    Anemia    Anxiety    Arthritis    Asthma    Carpal tunnel syndrome    Chronic back pain    Cigarette smoker 1/77/9390   Complication of anesthesia    in the past has had N/V, the last few surgeries she has not been   COPD (chronic obstructive pulmonary disease) ([place])    Easy bruising 07/10/2016   GERD (gastroesophageal reflux disease)    Headache    Heart murmur 2005   never had any problems   History of blood transfusion 2018   History of kidney stones    Hypertension    Hypoglycemia    Hypoglycemia    Iron deficiency anemia 07/11/2016   Neuropathy    both arms   Normocytic anemia 07/29/2015   Pancreatitis    Pneumonia    PONV (postoperative nausea and vomiting)    Postoperative anemia due to acute blood loss 11/15/2015   R lung cancer    Sciatica    Seizures ([place])    as a child when she would have an asthma attack. none as an adult   Shortness of breath dyspnea      Past Surgical History:  Procedure Laterality Date   ABDOMINAL HYSTERECTOMY  3009   APPLICATION OF ROBOTIC ASSISTANCE FOR SPINAL PROCEDURE N/A 09/05/2016   Procedure: APPLICATION OF ROBOTIC ASSISTANCE FOR SPINAL PROCEDURE;  Surgeon: Consuella Lose, MD;  Location: [place];  Service: Neurosurgery;  Laterality: N/A;   BACK SURGERY     BIOPSY  06/14/2020   Procedure: BIOPSY;  Surgeon: Daneil Dolin, MD;  [facility];  Service: Endoscopy;;   [place]   COLONOSCOPY     unsure last   COLONOSCOPY WITH PROPOFOL N/A 06/14/2020   Procedure: COLONOSCOPY WITH PROPOFOL;  Surgeon: Daneil Dolin, MD;  [facility];  Service: Endoscopy;  Laterality: N/A;  PM   cyst removal face     disk repair     neck and lumbar region   ESOPHAGOGASTRODUODENOSCOPY (EGD) WITH PROPOFOL N/A 06/14/2020   Procedure: ESOPHAGOGASTRODUODENOSCOPY (EGD) WITH PROPOFOL;  Surgeon: Daneil Dolin, MD;  [facility];  Service: Endoscopy;  Laterality: N/A;   FOOT SURGERY Bilateral    to file bones down    HARDWARE REMOVAL N/A 09/05/2016   Procedure: HARDWARE REMOVAL AND REPLACEMENT OF LUMBAR FIVE SCREWS;  Surgeon: Consuella Lose, MD;  Location: [place];  Service: Neurosurgery;  Laterality: N/A;   IMPLANTATION / PLACEMENT EPIDURAL NEUROSTIMULATOR ELECTRODES     INTERCOSTAL NERVE BLOCK Right 07/29/2020   Procedure: INTERCOSTAL NERVE BLOCK;  Surgeon: Melrose Nakayama, MD;  Location: [place];  Service: Thoracic;  Laterality: Right;   LAMINECTOMY     2006   lobectomy Right 07/2020   LUMBAR FUSION     LYMPH NODE BIOPSY Right 07/29/2020   Procedure: LYMPH NODE BIOPSY;  Surgeon: Melrose Nakayama, MD;  Location: [place];  Service: Thoracic;  Laterality: Right;   POLYPECTOMY  06/14/2020   Procedure: POLYPECTOMY INTESTINAL;  Surgeon: Daneil Dolin, MD;  [facility];  Service: Endoscopy;;   SPINAL CORD STIMULATOR BATTERY EXCHANGE     SPINAL CORD  STIMULATOR INSERTION N/A 07/05/2016   Procedure: REPLACEMENT OF LUMBAR SPINAL CORD STIMULATOR BATTERY;  Surgeon: Newman Pies, MD;  Location: [place];  Service: Neurosurgery;  Laterality: N/A;   TONSILLECTOMY      Family History  Problem Relation Age of Onset   Heart disease Mother    Diabetes Mother    Hypertension Mother    Hyperlipidemia Mother    COPD Mother        smoked   Cancer Mother        bile duct   COPD Father    Diabetes Father    Heart disease Father    Cancer Father        throat   Dementia Father    Asthma Son    Hypertension Sister    Hyperlipidemia Sister    Hypertension Brother    Hyperlipidemia Brother    Hypertension Brother    Hyperlipidemia Brother    Emphysema Maternal Grandmother        smoked   Colon cancer Neg Hx    Colon polyps Neg Hx     Social History   Socioeconomic History   Marital status: Married    Spouse name: [name]   Number of children: 1   Years of education: Not on file   Highest education level: Some college, no degree  Occupational History   Not on file  Tobacco Use   Smoking status: Every Day    Packs/day: 0.25    Years: 35.00    Pack years: 8.75    Types: Cigarettes   Smokeless tobacco: Never   Tobacco comments:    trying to quit  Vaping Use   Vaping Use: Never used  Substance and Sexual Activity   Alcohol use: Yes    Alcohol/week: 2.0 standard drinks    Types: 2 Glasses of wine per week    Comment: occasionally   Drug use: No   Sexual activity: Yes    Birth control/protection: None  Other Topics Concern   Not on file  Social History Narrative   Retired, 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 1 son, deceased. 2 pet dogs. Enjoys watching TV.   Social Determinants of Health   Financial Resource Strain: Not on file  Food Insecurity: Not on file  Transportation Needs: Not on file  Physical Activity: Not on file  Stress: Not on file  Social Connections: Not on file  Intimate Partner Violence: Not  At Risk   Fear of Current or Ex-Partner: No   Emotionally Abused: No   Physically Abused: No   Sexually Abused: No     Physical Exam   Vitals:   09/11/20 2234  09/12/20 0000  BP:  131/74  Pulse:  84  Resp:  14  Temp: 98.3 F (36.8 C)   SpO2:  100%    CONSTITUTIONAL: Chronically ill-appearing, NAD NEURO:  Alert and oriented x 3, no focal deficits EYES:  eyes equal and reactive ENT/NECK:  no LAD, no JVD CARDIO: Tachycardic rate, well-perfused, normal S1 and S2 PULM:  CTAB no wheezing or rhonchi GI/GU:  normal bowel sounds, non-distended, non-tender MSK/SPINE:  No gross deformities, no edema SKIN:  no rash, atraumatic PSYCH:  Appropriate speech and behavior  *Additional and/or pertinent findings included in MDM below  Diagnostic and Interventional Summary    EKG Interpretation  Date/Time:    Ventricular Rate:    PR Interval:    QRS Duration:   QT Interval:    QTC Calculation:   R Axis:     Text Interpretation:          Labs Reviewed  COMPREHENSIVE METABOLIC PANEL - Abnormal; Notable for the following components:      Result Value   Chloride 95 (*)    Glucose, Bld 129 (*)    Creatinine, Ser 0.40 (*)    Total Protein 8.5 (*)    All other components within normal limits  CBC - Abnormal; Notable for the following components:   MCV 105.1 (*)    All other components within normal limits  LIPASE, BLOOD  URINALYSIS, ROUTINE W REFLEX MICROSCOPIC  TROPONIN I (HIGH SENSITIVITY)  TROPONIN I (HIGH SENSITIVITY)    CT HEAD WO CONTRAST  Final Result    DG Chest Port 1 View  Final Result      Medications  ondansetron [facility]) injection 4 mg (4 mg Intravenous Given 09/11/20 2253)  sodium chloride 0.9 % bolus 1,000 mL (1,000 mLs Intravenous New Bag/Given 09/11/20 2329)  diphenhydrAMINE (BENADRYL) injection 25 mg (25 mg Intravenous Given 09/11/20 2330)  prochlorperazine (COMPAZINE) injection 10 mg (10 mg Intravenous Given 09/11/20 2330)     Procedures  /  Critical  Care Procedures  ED Course and Medical Decision Making  I have reviewed the triage vital signs, the nursing notes, and pertinent available records from the EMR.  Listed above are laboratory and imaging tests that I personally ordered, reviewed, and interpreted and then considered in my medical decision making (see below for details).  Considering appendicitis, atypical presentation of ACS, gastritis, intracranial mass or increased pressure.  Awaiting labs, CT, chest x-ray, will reassess.     Patient's symptoms are resolved after Benadryl, Compazine, liter of fluids.  No further vomiting here in the emergency department.  Labs are overall reassuring, troponin negative x2.  Chest x-ray largely unchanged, CT head is normal.  No abdominal pain or tenderness, moving bowels normally.  Nothing to suggest emergent process, could be explained by gastritis of some kind, advised close observation of symptoms at home, strict return precautions, appropriate for discharge.  Barth Kirks. Sedonia Small, MD [place] [email or web address]  Final Clinical Impressions(s) / ED Diagnoses     ICD-10-CM   1. Non-intractable vomiting with nausea, unspecified vomiting type  R11.2       ED Discharge Orders          Ordered    ondansetron (ZOFRAN ODT) 4 MG disintegrating tablet  Every 8 hours PRN        09/12/20 0149             Discharge Instructions Discussed with and Provided to Patient:     Discharge  Instructions      You were evaluated in the Emergency Department and after careful evaluation, we did not find any emergent condition requiring admission or further testing in the hospital.  Your exam/testing today was overall reassuring.  Recommend using the Zofran as needed at home for nausea and following up closely with your primary care doctor.  Please return to the Emergency Department if you experience any worsening of your condition.  Thank you for  allowing [place] to be a part of your care.         Maudie Flakes, MD 09/12/20 [phone]

## 2020-09-11 NOTE — ED Triage Notes (Signed)
Pt arrived via [facility], pt has had upper right lobe removed in May 2022, has been vomiting since 0500 today.

## 2020-09-12 ENCOUNTER — Emergency Department (HOSPITAL_COMMUNITY): Payer: Medicare Other

## 2020-09-12 DIAGNOSIS — R112 Nausea with vomiting, unspecified: Secondary | ICD-10-CM | POA: Diagnosis not present

## 2020-09-12 LAB — TROPONIN I (HIGH SENSITIVITY): Troponin I (High Sensitivity): 7 ng/L (ref <18)

## 2020-09-12 MED ORDER — ONDANSETRON 4 MG PO TBDP: 4.0000 mg | ORAL_TABLET | Freq: Three times a day (TID) | ORAL | 0 refills | Status: DC | PRN

## 2020-09-12 NOTE — Discharge Instructions (Addendum)
You were evaluated in the Emergency Department and after careful evaluation, we did not find any emergent condition requiring admission or further testing in the hospital.  Your exam/testing today was overall reassuring.  Recommend using the Zofran as needed at home for nausea and following up closely with your primary care doctor.  Please return to the Emergency Department if you experience any worsening of your condition.  Thank you for allowing [place] to be a part of your care.

## 2020-09-14 ENCOUNTER — Encounter (HOSPITAL_COMMUNITY): Payer: Medicare Other

## 2020-09-15 ENCOUNTER — Ambulatory Visit (INDEPENDENT_AMBULATORY_CARE_PROVIDER_SITE_OTHER): Payer: Medicare Other | Admitting: Family Medicine

## 2020-09-15 ENCOUNTER — Encounter: Payer: Self-pay | Admitting: Family Medicine

## 2020-09-15 ENCOUNTER — Ambulatory Visit (INDEPENDENT_AMBULATORY_CARE_PROVIDER_SITE_OTHER): Payer: Medicare Other

## 2020-09-15 ENCOUNTER — Ambulatory Visit: Payer: Medicare Other | Admitting: Internal Medicine

## 2020-09-15 ENCOUNTER — Other Ambulatory Visit: Payer: Self-pay

## 2020-09-15 VITALS — BP 126/72 | HR 101 | Ht 63.0 in | Wt 96.0 lb

## 2020-09-15 DIAGNOSIS — W19XXXA Unspecified fall, initial encounter: Secondary | ICD-10-CM | POA: Diagnosis not present

## 2020-09-15 DIAGNOSIS — M7989 Other specified soft tissue disorders: Secondary | ICD-10-CM

## 2020-09-15 DIAGNOSIS — M79672 Pain in left foot: Secondary | ICD-10-CM

## 2020-09-15 DIAGNOSIS — S93602A Unspecified sprain of left foot, initial encounter: Secondary | ICD-10-CM

## 2020-09-15 DIAGNOSIS — M79671 Pain in right foot: Secondary | ICD-10-CM | POA: Diagnosis not present

## 2020-09-15 DIAGNOSIS — S93601A Unspecified sprain of right foot, initial encounter: Secondary | ICD-10-CM

## 2020-09-15 MED ORDER — PREDNISONE 20 MG PO TABS: ORAL_TABLET | ORAL | 0 refills | Status: DC

## 2020-09-15 NOTE — Progress Notes (Deleted)
Subjective:     Patient ID: Jasmine [name], female   DOB: 06/30/1955    MRN: [id number]    Brief patient profile: 42 yowf   active smoker/MM  dx with asthma as child but by HS outgrew it until her 53's with flares but better between until early 2000s and maintain on multliple inhalers and then 02/2016 hosp in [place] then [facility] April 2019 and newly started on 02 = 2lpm as needed daytime and hs and referred to pulmonary clinic 08/24/2017 by Dr   Danice Goltz with GOLD II spirometry documented 09/21/17     Admit date: 06/30/2017 Discharge date: 07/09/2017     Recommendations for Outpatient Follow-up:  pls refer to sleep medicine for osa testing/epworht/stopbang pls continue cessation counseling for Tob Needs cxr 1 mo Consider Megace/chantix? Recheck LFT and Magnesium in 1-2 weeks---has elevated LFT unknown sig [?CVC liver]   Discharge Diagnoses:  Principal Problem:   Acute respiratory failure with hypoxia (HCC) Active Problems:   Chronic back pain   HTN (hypertension)   Chronic narcotic use   Hypokalemia   COPD with acute exacerbation (HCC)   Malnutrition of moderate degree     Discharge Condition: please refert   Diet recommendation: eat anything to gain weight for now        [facility] Weights    06/30/17 2122 07/01/17 0500 07/02/17 0412  Weight: 47.2 kg (104 lb 0.9 oz) 47.2 kg (104 lb 0.9 oz) 48.1 kg (106 lb 0.7 oz)      History of present illness:  Please see note from Dr. Paulene Floor "65 y.o. female with a history of COPD, GERD, neuropathy, chronic pain on chronic narcotics, hypertension.  Patient presents with worsening shortness of breath over the past 3 to 4 weeks.  She presented to ED with acute respiratory distress with hypoxia and subsequently started on bipap"       Hospital Course:  Acute respiratory failure with hypoxia-patient says that she responded very well to nightly BiPAP.  She was able to sleep for the first time in a very long time. ?OSA-referrinf to  Sleep as OP.  Needs screening fro pulm htn as well? Could benefit from Cardio-pulm rehab if avail  [place] COPD [place]ation- continue current treatments with IV steroids, antibiotics and continuous around-the-clock neb treatments-narrowed and tapered on d/c home--5 day steroid taper Essential Hypertension-stable. Hypokalemia-replacement ordered.  recheck mag as op Chronic pain with opioid dependence-resume home pain med management regimen which has been stable per patient.          08/24/2017 1st [facility] office visit/ Rossie Scarfone   Chief Complaint  Patient presents with   Pulmonary Consult    Referred by Dr. Adam Phenix.  Pt states dxed with COPD approx 10 years ago. She states she was hospitalized in April 2019 for COPD flare and sent home with o2.  She states she gets SOB with minimal exertion such as walking from room to room at home. She uses proair 3 x per wk on average and has duoneb but has not needed it since starting o2.    MMRC3 = can't walk 100 yards even at a slow pace at a flat grade s stopping due to sob  Even on 02  Rattling cough more prod first thing in am = min mucoid  maint on symbicort 160    Off prednisone x sev weeks and about the same since stopped just on symbicort 160 2bid  And   2lpm with activity  Sleeping  2lpm and 30 degrees    rec Plan A = Automatic =   Symbicort 160  Take 2 puffs first thing in am and then another 2 puffs about 12 hours later and spiriva 2 pffs each am  Work on inhaler technique:     Plan B = Backup Only use your albuterol (Proair)as a rescue medication Plan C = Crisis - only use your albuterol nebulizer if you first try Plan B      08/05/2019  f/u ov/Charlene Cowdrey re:  GOLD II/ symb /spiriva still smoking  - had Moderna vaccine  Chief Complaint  Patient presents with   Follow-up    Breathing has been worse over the past 2 months- she is waking up in the morning feeling SOB and has to use her rescue inhaler. She is  using the rescue inhaler most days.   Dyspnea: foot slows her down> sob  Cough: minimal in am / white  Sleeping: on 02 flat bed  SABA use: not using symb first thing as rec and uses saba first  02: 2lpm hs only / wants portable system rec Plan A = Automatic = Always=    symbicort then spiriva first thing then symbicot 12 hours later  Work on inhaler technique:  Plan B = Backup (to supplement plan A, not to replace it) Only use your albuterol inhaler as a rescue medication Plan C = Crisis (instead of Plan B but only if Plan B stops working) - only use your albuterol nebulizer if you first try Plan B and it fails to help > ok to use the nebulizer up to every 4 hours but if start needing it regularly call for immediate appointment The key is to stop smoking completely before smoking completely stops you!       02/19/2020  f/u ov/[place] office/Revere Maahs re: GOLD II/ 02 dep still smoking Chief Complaint  Patient presents with   Follow-up    Here for o2 recert- c/o waking up in the night coughing and "gagging" x 3 months. Cough is occ prod with clear sputum. She is using her albuterol inhaler about once per day.   Dyspnea: foot drop limiting / around the house doe  Cough: nightly cough/ gag 4 h after hs  and continues until gets up and uses saba in am / min clear mucus  Sleeping: bed is flat with wedge  SABA use: as above/ no neb  02: 2lpm hs  On ppi with breakfast  Rec Protonix 40 mg Take 30- 60 min before your first and last meals of the day  GERD   diet  - ONO RA  03/09/2020 :   desats on RA < 89% x 2 h 39 min > 03/23/2020 rec 2lpm hs   Admit date: 08/24/2020 Discharge date: 08/26/2020   Recommendations for Outpatient Follow-up:  Repeat CBC to follow hemoglobin and stability. Repeat basic metabolic panel to evaluate lites renal function. Continue to follow patient response to analgesic management. Follow final results of thoracentesis fluid.   Discharge Diagnoses:  Active  Problems:   Chest pain   Pleuritic chest pain   Protein-calorie malnutrition, severe Hypertension Tobacco abuse Squamous cell lung cancer Chronic respiratory failure with hypoxia/COPD and asthma Severe protein calorie malnutrition GERD     Filed Weights    08/24/20 2153  Weight: 43.1 kg      History of present illness:  Jasmine [name] is a 65 y.o. 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  Patient expressed experiencing ongoing chest pain for the last 3-4 weeks after upper lobectomy on the right side, but in the last 5 days prior to admission pain has become worse and has been associated with shortness of breath and dyspnea on exertion. Patient reports no fever, no chills, no nausea, no vomiting, no dysuria, no hematuria, no melena, no hematochezia, no new focal deficits (patient with chronic right foot drop and bilateral upper extremities neuropathy).  Reports no sick contacts.   Patient is s/p vaccination x2 with Jasmine [name] and [employer] booster   ED Course: In the ED CT chest demonstrating slight pneumothorax in the apical region along with expected changes from recent surgery; positive moderate loculated pleural effusion and right side seventh rib healing fracture.  Mild troponin flat elevation, no acute ischemic changes on EKG and otherwise a stable blood work.  TRH has been consulted to further assist patient with management of her pain and to further address ongoing pleural effusion.  Initially cardiothoracic surgeon contacted with recommendations to go to [facility] for thoracentesis and further evaluation; I have discussed with Dr. Roxan Hockey (patient's cardiothoracic surgeon that performed upper  lobectomy in May 26/2022).  Who recommended to perform thoracentesis to rule out infection and to continue medical management.  No need for transfer.   Hospital Course:  1-chest pain -Pleuritic in nature and associated with right-sided healing rib fracture and pleural effusion. -Recent robotic right upper lobectomy. -Status postthoracentesis  =  120 cc only/ benign exudate lymphocytic  -Patient reports an improvement after fluid removal and analgesics therapy. -Resume home chronic pain management and follow-up with pain clinic provider to further adjust her treatment. -Continue outpatient follow-up with cardiothoracic surgery as previously instructed.   2-chronic respiratory failure/COPD/asthma -No wheezing -Continue oxygen supplementation -Resume home bronchodilator regimen. -Delsym has been added to help with coughing spells and by that minimization of pleuritic chest discomfort.   3-history of tobacco abuse -Cessation counseling provided -Nicotine patch offered.   4-GERD -Continue PPI.   5-squamous right lung cancer -Continue outpatient follow-up with oncology service -Status post robotic lobectomy -Continue outpatient follow-up with cardiothoracic surgeon as previously instructed.   7-history of depression/insomnia -Continue Cymbalta and trazodone.   8-hypertension -Continue treatment with Norvasc   9-severe protein calorie malnutrition -Patient instructed for good oral nutrition and 3 times a day feeding supplementation in between meals..   10-history of chronic pain syndrome -Continue outpatient follow-up with pain management clinic -Narcotic prescription provided at discharge.   11-hyperlipidemia -Continue statins.  09/15/2020  f/u ov/[place] office/Jaiyla Granados re:  No chief complaint on file.   Dyspnea:  *** Cough: *** Sleeping: *** SABA use: *** 02: *** Covid status: *** Lung cancer screening: ***   No obvious day to day or daytime variability or assoc  excess/ purulent sputum or mucus plugs or hemoptysis or cp or chest tightness, subjective wheeze or overt sinus or hb symptoms.   *** without nocturnal  or early am exacerbation  of respiratory  c/o's or need for noct saba. Also denies any obvious fluctuation of symptoms with weather or environmental changes or other aggravating or alleviating factors except as outlined above   No unusual exposure hx or h/o childhood pna/ asthma or knowledge of premature birth.  Current Allergies, Complete Past Medical History, Past Surgical History, Family History, and Social  History were reviewed in [facility] record.  ROS  The following are not active complaints unless bolded Hoarseness, sore throat, dysphagia, dental problems, itching, sneezing,  nasal congestion or discharge of excess mucus or purulent secretions, ear ache,   fever, chills, sweats, unintended wt loss or wt gain, classically pleuritic or exertional cp,  orthopnea pnd or arm/hand swelling  or leg swelling, presyncope, palpitations, abdominal pain, anorexia, nausea, vomiting, diarrhea  or change in bowel habits or change in bladder habits, change in stools or change in urine, dysuria, hematuria,  rash, arthralgias, visual complaints, headache, numbness, weakness or ataxia or problems with walking or coordination,  change in mood or  memory.        No outpatient medications have been marked as taking for the 09/15/20 encounter (Appointment) with Tanda Rockers, MD.                             Objective:   Physical Exam    09/15/2020         ***  02/19/2020       96  08/05/2019           98.6  01/09/2018        105  09/21/2017        104   08/24/17 105 lb 9.6 oz (47.9 kg)  07/31/17 106 lb (48.1 kg)  07/26/17 105 lb 3.2 oz (47.7 kg)     Vital signs reviewed  02/19/2020  - Note at rest 02 sats  94% on RA           Mod bar ***                 I personally reviewed images and agree with radiology  impression as follows:   Chest CTa  08/25/20  1. No pulmonary embolus. 2. Surgical changes status post right upper lobectomy with interval development of a trace apical right pneumothorax and small to moderate-sized right loculated basilar and apical pleural effusion       Assessment:

## 2020-09-15 NOTE — Progress Notes (Signed)
BP 126/72   Pulse (!) 101   Ht 5\' 3"  (1.6 m)   Wt 96 lb (43.5 kg)   SpO2 95%   BMI 17.01 kg/m    Subjective:   Patient ID: Jasmine [name], female    DOB: April 27, 1955, 65 y.o.   MRN: [id number]  HPI: Jasmine [name] is a 65 y.o. female presenting on 09/15/2020 for Fall and Foot Injury (Swelling and pain. Bilaterally.)   HPI Patient is coming in today because she had a fall where she twisted her feet underneath her.  She says she was feeling lightheaded yesterday and started to go down and somehow twisted and fell with her feet twisted underneath her.  Since then she has been having pain and having trouble even standing on her feet because of the pain and she is coming in today because she wanted to get some x-rays to have it looked at.  She does have swelling and pain worse in the right foot than the left foot and bruising on the right foot as well.  Relevant past medical, surgical, family and social history reviewed and updated as indicated. Interim medical history since our last visit reviewed. Allergies and medications reviewed and updated.  Review of Systems  Constitutional:  Negative for chills and fever.  Eyes:  Negative for visual disturbance.  Respiratory:  Negative for chest tightness and shortness of breath.   Cardiovascular:  Negative for chest pain.  Musculoskeletal:  Positive for arthralgias, gait problem and joint swelling. Negative for back pain.  Skin:  Negative for rash.  Psychiatric/Behavioral:  Negative for agitation and behavioral problems.   All other systems reviewed and are negative.  Per HPI unless specifically indicated above   Allergies as of 09/15/2020       Reactions   Lyrica [pregabalin] Other (See Comments)   EXTREME SHAKING/TREMBLING   Darvon [propoxyphene]    UNSPECIFIED REACTION    Gabapentin    Extreme shaking and trembling    Augmentin [amoxicillin-pot Clavulanate] Nausea And Vomiting   Has patient had a PCN reaction causing immediate  rash, facial/tongue/throat swelling, SOB or lightheadedness with hypotension:no Has patient had a PCN reaction causing severe rash involving mucus membranes or skin necrosis:No Has patient had a PCN reaction that required hospitalization:No Has patient had a PCN reaction occurring within the last 10 years:Yes--ONLY N/V If all of the above answers are "NO", then may proceed with Cephalosporin use.   Erythromycin Itching, Rash      Vibramycin [doxycycline Calcium] Itching, Rash   "[place] "        Medication List        Accurate as of September 15, 2020  3:46 PM. If you have any questions, ask your nurse or doctor.          albuterol 108 (90 Base) MCG/ACT inhaler Commonly known as: VENTOLIN HFA Inhale into the lungs every 6 (six) hours as needed for wheezing or shortness of breath.   amLODipine 10 MG tablet Commonly known as: NORVASC Take 1 tablet (10 mg total) by mouth daily.   atorvastatin 40 MG tablet Commonly known as: Lipitor Take 1 tablet (40 mg total) by mouth daily.   budesonide-formoterol 160-4.5 MCG/ACT inhaler Commonly known as: SYMBICORT USE 2 INHALATIONS TWICE A DAY   Cholecalciferol 125 MCG (5000 UT) capsule Take 5,000 Units by mouth daily.   dextromethorphan 30 MG/5ML liquid Commonly known as: DELSYM Take 5 mLs (30 mg total) by mouth every 8 (eight) hours as needed for  cough.   diclofenac sodium 1 % Gel Commonly known as: VOLTAREN Apply 1 application topically daily as needed for muscle pain.   diclofenac Sodium 1 % Gel Commonly known as: VOLTAREN Apply 2 g topically 4 (four) times daily.   DULoxetine 30 MG capsule Commonly known as: CYMBALTA Take 3 capsules (90 mg total) by mouth daily.   Ensure High Protein Liqd Drink 1 can 3 times daily.   ipratropium-albuterol 0.5-2.5 (3) MG/3ML Soln Commonly known as: DUONEB Inhale 3 mLs into the lungs 4 (four) times daily as needed (for shortness of breath/wheezing). *May use as needed   magnesium  oxide 400 (241.3 Mg) MG tablet Commonly known as: MAG-OX Take 400 mg by mouth 2 (two) times daily.   Metanx 3-90.314-2-35 MG Caps TAKE 1 TABLET BY MOUTH TWO TIMES A DAY   nicotine 14 mg/24hr patch Commonly known as: Nicoderm CQ Place 1 patch (14 mg total) onto the skin daily.   omeprazole 40 MG capsule Commonly known as: PRILOSEC Take 1 capsule (40 mg total) by mouth 2 (two) times daily. Take 1 capsule 30 mins before breakfast and evening meal.   ondansetron 4 MG disintegrating tablet Commonly known as: Zofran ODT Take 1 tablet (4 mg total) by mouth every 8 (eight) hours as needed for nausea or vomiting.   oxyCODONE-acetaminophen 10-325 MG tablet Commonly known as: PERCOCET Take 1 tablet by mouth 5 (five) times daily.   OxyCONTIN 15 mg 12 hr tablet Generic drug: oxyCODONE SMARTSIG:1 Tablet(s) By Mouth Every 12 Hours   OXYGEN Inhale 3 L into the lungs See admin instructions. continuous   Poly-Iron 150 Forte 150-0.025-1 MG Caps Generic drug: Iron Polysacch Cmplx-B12-FA Take 1 capsule by mouth 2 (two) times daily.   predniSONE 20 MG tablet Commonly known as: DELTASONE 2 po at same time daily for 5 days Started by: Fransisca Kaufmann Awab Abebe, MD   Spiriva Respimat 2.5 MCG/ACT Aers Generic drug: Tiotropium Bromide Monohydrate USE 2 INHALATIONS DAILY   tizanidine 6 MG capsule Commonly known as: ZANAFLEX Take 6 mg by mouth 3 (three) times daily.   traZODone 150 MG tablet Commonly known as: DESYREL TAKE 1 TO 2 TABLETS AT BEDTIME         Objective:   BP 126/72   Pulse (!) 101   Ht 5\' 3"  (1.6 m)   Wt 96 lb (43.5 kg)   SpO2 95%   BMI 17.01 kg/m   Wt Readings from Last 3 Encounters:  09/15/20 96 lb (43.5 kg)  09/11/20 96 lb (43.5 kg)  08/30/20 96 lb 9.6 oz (43.8 kg)    Physical Exam Vitals and nursing note reviewed.  Constitutional:      General: She is not in acute distress.    Appearance: She is well-developed. She is not diaphoretic.  Musculoskeletal:         General: Normal range of motion.     Right foot: Normal range of motion. Swelling and tenderness present. No deformity, laceration or bony tenderness.     Left foot: Normal range of motion. Swelling and tenderness present. No deformity, laceration or bony tenderness.  Skin:    General: Skin is warm and dry.     Findings: No rash.  Neurological:     Mental Status: She is alert and oriented to person, place, and time.     Coordination: Coordination normal.  Psychiatric:        Behavior: Behavior normal.    Bilateral foot x-ray: No signs of acute bony abnormality or  fracture, await final read from radiology  Assessment & Plan:   Problem List Items Addressed This Visit   None Visit Diagnoses     Foot sprain, right, initial encounter    -  Primary   Relevant Medications   predniSONE (DELTASONE) 20 MG tablet   Fall, initial encounter       Relevant Orders   DG Foot Complete Left   DG Foot Complete Right   Foot swelling       Relevant Orders   DG Foot Complete Left   DG Foot Complete Right   Pain in both feet       Relevant Orders   DG Foot Complete Left   DG Foot Complete Right   Foot sprain, left, initial encounter       Relevant Medications   predniSONE (DELTASONE) 20 MG tablet       Will do short course of prednisone that if patient wants she can wear a boot to help but other than that conservative management.  No signs of fracture Follow up plan: Return if symptoms worsen or fail to improve.  Counseling provided for all of the vaccine components Orders Placed This Encounter  Procedures   DG Foot Complete Left   DG Foot Complete Right    Caryl Pina, MD [facility] 09/15/2020, 3:46 PM

## 2020-09-16 ENCOUNTER — Encounter (HOSPITAL_COMMUNITY): Payer: Medicare Other

## 2020-09-21 ENCOUNTER — Encounter: Payer: Self-pay | Admitting: Internal Medicine

## 2020-09-21 ENCOUNTER — Encounter (HOSPITAL_COMMUNITY): Payer: Medicare Other

## 2020-09-23 ENCOUNTER — Encounter (HOSPITAL_COMMUNITY): Payer: Medicare Other

## 2020-09-24 ENCOUNTER — Ambulatory Visit (HOSPITAL_COMMUNITY): Payer: Medicare Other | Admitting: Hematology and Oncology

## 2020-09-24 ENCOUNTER — Inpatient Hospital Stay (HOSPITAL_COMMUNITY): Payer: Medicare Other

## 2020-09-28 ENCOUNTER — Encounter (HOSPITAL_COMMUNITY): Payer: Medicare Other

## 2020-09-30 ENCOUNTER — Encounter (HOSPITAL_COMMUNITY): Payer: Medicare Other

## 2020-10-04 ENCOUNTER — Other Ambulatory Visit: Payer: Self-pay | Admitting: Thoracic Surgery (Cardiothoracic Vascular Surgery)

## 2020-10-04 DIAGNOSIS — Z902 Acquired absence of lung [part of]: Secondary | ICD-10-CM

## 2020-10-05 ENCOUNTER — Ambulatory Visit
Admission: RE | Admit: 2020-10-05 | Discharge: 2020-10-05 | Disposition: A | Discharge status: Home / Self Care | Payer: Medicare Other | Source: Ambulatory Visit | Attending: Thoracic Surgery (Cardiothoracic Vascular Surgery) | Admitting: Thoracic Surgery (Cardiothoracic Vascular Surgery)

## 2020-10-05 ENCOUNTER — Other Ambulatory Visit: Payer: Self-pay

## 2020-10-05 ENCOUNTER — Encounter: Payer: Self-pay | Admitting: Thoracic Surgery (Cardiothoracic Vascular Surgery)

## 2020-10-05 ENCOUNTER — Ambulatory Visit (INDEPENDENT_AMBULATORY_CARE_PROVIDER_SITE_OTHER): Payer: Self-pay | Admitting: Thoracic Surgery (Cardiothoracic Vascular Surgery)

## 2020-10-05 ENCOUNTER — Encounter (HOSPITAL_COMMUNITY): Payer: Medicare Other

## 2020-10-05 VITALS — BP 125/69 | HR 100 | Resp 20 | Ht 63.0 in | Wt 97.0 lb

## 2020-10-05 DIAGNOSIS — Z902 Acquired absence of lung [part of]: Secondary | ICD-10-CM

## 2020-10-05 DIAGNOSIS — R911 Solitary pulmonary nodule: Secondary | ICD-10-CM

## 2020-10-05 NOTE — Progress Notes (Signed)
[address]       [place] [address]             [phone]      HPI: Jasmine [name] returns for a scheduled follow-up visit  Jasmine [name] is a 65 year old woman with a history of tobacco abuse, COPD, asthma, reflux, hypertension, hyperglycemia, anemia, anxiety, chronic pain, sciatica, peripheral neuropathy, foot drop, pancreatitis, pneumonia, and seizures.  She was found to have a spiculated right upper lobe nodule that was hypermetabolic on PET/CT.  She was felt to be a high risk candidate for surgery, and I try to talk her into doing stereotactic radiation.  Eventually, she underwent a robotic assisted right upper lobectomy on 07/29/2020.  Postoperative course was remarkable only for the degree of pain.  She was taking a long-acting and short acting hydrocodone.  She still was not getting relief with that.  I last saw her in the office on 08/17/2020.  She was still having severe issues with pain.  In the interim since her last visit she saw her pain physician and was changed to OxyContin as a standing dose and oxycodone as needed.  That has been more effective for her.  Past Medical History:  Diagnosis Date   Allergy    Anemia    Anxiety    Arthritis    Asthma    Carpal tunnel syndrome    Chronic back pain    Cigarette smoker 11/18/7827   Complication of anesthesia    in the past has had N/V, the last few surgeries she has not been   COPD (chronic obstructive pulmonary disease) ([place])    Easy bruising 07/10/2016   GERD (gastroesophageal reflux disease)    Headache    Heart murmur 2005   never had any problems   History of blood transfusion 2018   History of kidney stones    Hypertension    Hypoglycemia    Hypoglycemia    Iron deficiency anemia 07/11/2016   Neuropathy    both arms   Normocytic anemia 07/29/2015   Pancreatitis    Pneumonia    PONV (postoperative nausea and vomiting)    Postoperative anemia due to acute blood loss 11/15/2015   R lung  cancer    Sciatica    Seizures ([place])    as a child when she would have an asthma attack. none as an adult   Shortness of breath dyspnea     Current Outpatient Medications  Medication Sig Dispense Refill   albuterol (VENTOLIN HFA) 108 (90 Base) MCG/ACT inhaler Inhale into the lungs every 6 (six) hours as needed for wheezing or shortness of breath.     amLODipine (NORVASC) 10 MG tablet Take 1 tablet (10 mg total) by mouth daily. 90 tablet 3   atorvastatin (LIPITOR) 40 MG tablet Take 1 tablet (40 mg total) by mouth daily. 90 tablet 3   budesonide-formoterol (SYMBICORT) 160-4.5 MCG/ACT inhaler USE 2 INHALATIONS TWICE A DAY 20.4 g 2   Cholecalciferol 125 MCG (5000 UT) capsule Take 5,000 Units by mouth daily.     dextromethorphan (DELSYM) 30 MG/5ML liquid Take 5 mLs (30 mg total) by mouth every 8 (eight) hours as needed for cough. 148 mL 0   diclofenac sodium (VOLTAREN) 1 % GEL Apply 1 application topically daily as needed for muscle pain.     diclofenac Sodium (VOLTAREN) 1 % GEL Apply 2 g topically 4 (four) times daily.     DULoxetine (CYMBALTA) 30 MG  capsule Take 3 capsules (90 mg total) by mouth daily. 270 capsule 4   ipratropium-albuterol (DUONEB) 0.5-2.5 (3) MG/3ML SOLN Inhale 3 mLs into the lungs 4 (four) times daily as needed (for shortness of breath/wheezing). *May use as needed     Iron Polysacch Cmplx-B12-FA (POLY-IRON 150 FORTE) 150-0.025-1 MG CAPS Take 1 capsule by mouth 2 (two) times daily. 60 capsule 2   L-Methylfolate-Algae-B12-B6 (METANX) 3-90.314-2-35 MG CAPS TAKE 1 TABLET BY MOUTH TWO TIMES A DAY 180 capsule 4   magnesium oxide (MAG-OX) 400 (241.3 Mg) MG tablet Take 400 mg by mouth 2 (two) times daily.     nicotine (NICODERM CQ) 14 mg/24hr patch Place 1 patch (14 mg total) onto the skin daily. 30 patch 0   Nutritional Supplements (ENSURE HIGH PROTEIN) LIQD Drink 1 can 3 times daily. 21330 mL PRN   omeprazole (PRILOSEC) 40 MG capsule Take 1 capsule (40 mg total) by mouth 2 (two)  times daily. Take 1 capsule 30 mins before breakfast and evening meal. 90 capsule 3   ondansetron (ZOFRAN ODT) 4 MG disintegrating tablet Take 1 tablet (4 mg total) by mouth every 8 (eight) hours as needed for nausea or vomiting. 20 tablet 0   oxyCODONE-acetaminophen (PERCOCET) 10-325 MG tablet Take 1 tablet by mouth 5 (five) times daily.     OXYCONTIN 15 MG 12 hr tablet SMARTSIG:1 Tablet(s) By Mouth Every 12 Hours     OXYGEN Inhale 3 L into the lungs See admin instructions. continuous     SPIRIVA RESPIMAT 2.5 MCG/ACT AERS USE 2 INHALATIONS DAILY 12 g 3   tizanidine (ZANAFLEX) 6 MG capsule Take 6 mg by mouth 3 (three) times daily.     traZODone (DESYREL) 150 MG tablet TAKE 1 TO 2 TABLETS AT BEDTIME 180 tablet 3   predniSONE (DELTASONE) 20 MG tablet 2 po at same time daily for 5 days (Patient not taking: Reported on 10/05/2020) 10 tablet 0   No current facility-administered medications for this visit.    Physical Exam BP 125/69   Pulse 100   Resp 20   Ht 5\' 3"  (1.6 m)   Wt 97 lb (44 kg)   SpO2 100% Comment: 4L O2 per [place]  BMI 17.55 kg/m  65 year old woman in no acute distress Alert and oriented x3 with no focal deficits Incisions well-healed Lungs diminished at right base otherwise clear Cardiac regular rate and rhythm  Diagnostic Tests: I personally reviewed her chest x-ray.  Shows postoperative changes from right upper lobectomy.  Impression: Jasmine [name] is a 65 year old woman with a history of tobacco abuse, COPD, asthma, reflux, hypertension, hyperglycemia, anemia, anxiety, chronic pain, sciatica, peripheral neuropathy, foot drop, pancreatitis, pneumonia, and seizures.   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  Status post right upper lobectomy.  She sees Dr. Delton Coombes again in the near future.  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  She does say that it seems to get a little better each day, although it still  significant.  She seems in a much better frame of mind related to the pain today than she was previously.  Tobacco abuse-unfortunately she continues to smoke but a relatively small amount.  Emphasized the importance of complete cessation.  Plan: I will plan to see her back in about 3 months with a PA and lateral chest x-ray  Melrose Nakayama, MD [facility] [phone]

## 2020-10-07 ENCOUNTER — Encounter (HOSPITAL_COMMUNITY): Payer: Medicare Other

## 2020-10-10 ENCOUNTER — Encounter (HOSPITAL_COMMUNITY): Payer: Self-pay | Admitting: Emergency Medicine

## 2020-10-10 ENCOUNTER — Emergency Department (HOSPITAL_COMMUNITY): Payer: Medicare Other

## 2020-10-10 ENCOUNTER — Inpatient Hospital Stay (HOSPITAL_COMMUNITY)
Admission: EM | Admit: 2020-10-10 | Discharge: 2020-10-14 | DRG: 392 | Disposition: A | Discharge status: Home / Self Care | Payer: Medicare Other | Attending: Internal Medicine | Admitting: Internal Medicine

## 2020-10-10 ENCOUNTER — Other Ambulatory Visit: Payer: Self-pay

## 2020-10-10 DIAGNOSIS — Z825 Family history of asthma and other chronic lower respiratory diseases: Secondary | ICD-10-CM

## 2020-10-10 DIAGNOSIS — M549 Dorsalgia, unspecified: Secondary | ICD-10-CM | POA: Diagnosis present

## 2020-10-10 DIAGNOSIS — Z79899 Other long term (current) drug therapy: Secondary | ICD-10-CM

## 2020-10-10 DIAGNOSIS — K219 Gastro-esophageal reflux disease without esophagitis: Secondary | ICD-10-CM | POA: Diagnosis present

## 2020-10-10 DIAGNOSIS — F17210 Nicotine dependence, cigarettes, uncomplicated: Secondary | ICD-10-CM | POA: Diagnosis present

## 2020-10-10 DIAGNOSIS — R809 Proteinuria, unspecified: Secondary | ICD-10-CM | POA: Diagnosis present

## 2020-10-10 DIAGNOSIS — E872 Acidosis, unspecified: Secondary | ICD-10-CM

## 2020-10-10 DIAGNOSIS — Z902 Acquired absence of lung [part of]: Secondary | ICD-10-CM

## 2020-10-10 DIAGNOSIS — R569 Unspecified convulsions: Secondary | ICD-10-CM | POA: Diagnosis present

## 2020-10-10 DIAGNOSIS — J449 Chronic obstructive pulmonary disease, unspecified: Secondary | ICD-10-CM | POA: Diagnosis present

## 2020-10-10 DIAGNOSIS — K529 Noninfective gastroenteritis and colitis, unspecified: Secondary | ICD-10-CM | POA: Diagnosis not present

## 2020-10-10 DIAGNOSIS — Z7951 Long term (current) use of inhaled steroids: Secondary | ICD-10-CM

## 2020-10-10 DIAGNOSIS — Z880 Allergy status to penicillin: Secondary | ICD-10-CM

## 2020-10-10 DIAGNOSIS — Z8719 Personal history of other diseases of the digestive system: Secondary | ICD-10-CM

## 2020-10-10 DIAGNOSIS — G8929 Other chronic pain: Secondary | ICD-10-CM | POA: Diagnosis present

## 2020-10-10 DIAGNOSIS — Z90710 Acquired absence of both cervix and uterus: Secondary | ICD-10-CM

## 2020-10-10 DIAGNOSIS — Z87442 Personal history of urinary calculi: Secondary | ICD-10-CM

## 2020-10-10 DIAGNOSIS — J432 Centrilobular emphysema: Secondary | ICD-10-CM | POA: Diagnosis present

## 2020-10-10 DIAGNOSIS — M1990 Unspecified osteoarthritis, unspecified site: Secondary | ICD-10-CM | POA: Diagnosis present

## 2020-10-10 DIAGNOSIS — E860 Dehydration: Secondary | ICD-10-CM | POA: Diagnosis present

## 2020-10-10 DIAGNOSIS — E876 Hypokalemia: Secondary | ICD-10-CM | POA: Diagnosis not present

## 2020-10-10 DIAGNOSIS — J302 Other seasonal allergic rhinitis: Secondary | ICD-10-CM | POA: Diagnosis present

## 2020-10-10 DIAGNOSIS — Z85118 Personal history of other malignant neoplasm of bronchus and lung: Secondary | ICD-10-CM

## 2020-10-10 DIAGNOSIS — E8889 Other specified metabolic disorders: Secondary | ICD-10-CM | POA: Diagnosis present

## 2020-10-10 DIAGNOSIS — F419 Anxiety disorder, unspecified: Secondary | ICD-10-CM | POA: Diagnosis present

## 2020-10-10 DIAGNOSIS — Z981 Arthrodesis status: Secondary | ICD-10-CM

## 2020-10-10 DIAGNOSIS — Z888 Allergy status to other drugs, medicaments and biological substances status: Secondary | ICD-10-CM

## 2020-10-10 DIAGNOSIS — Z9981 Dependence on supplemental oxygen: Secondary | ICD-10-CM

## 2020-10-10 DIAGNOSIS — Z881 Allergy status to other antibiotic agents status: Secondary | ICD-10-CM

## 2020-10-10 DIAGNOSIS — I700 Atherosclerosis of aorta: Secondary | ICD-10-CM | POA: Diagnosis present

## 2020-10-10 DIAGNOSIS — Z20822 Contact with and (suspected) exposure to covid-19: Secondary | ICD-10-CM | POA: Diagnosis present

## 2020-10-10 DIAGNOSIS — Z8249 Family history of ischemic heart disease and other diseases of the circulatory system: Secondary | ICD-10-CM

## 2020-10-10 DIAGNOSIS — J9611 Chronic respiratory failure with hypoxia: Secondary | ICD-10-CM | POA: Diagnosis present

## 2020-10-10 DIAGNOSIS — R824 Acetonuria: Secondary | ICD-10-CM | POA: Diagnosis present

## 2020-10-10 DIAGNOSIS — I10 Essential (primary) hypertension: Secondary | ICD-10-CM | POA: Diagnosis present

## 2020-10-10 DIAGNOSIS — Z885 Allergy status to narcotic agent status: Secondary | ICD-10-CM

## 2020-10-10 LAB — CBC
HCT: 39.9 % (ref 36.0–46.0)
Hemoglobin: 12 g/dL (ref 12.0–15.0)
MCH: 31.7 pg (ref 26.0–34.0)
MCHC: 30.1 g/dL (ref 30.0–36.0)
MCV: 105.3 fL — ABNORMAL HIGH (ref 80.0–100.0)
Platelets: 344 10*3/uL (ref 150–400)
RBC: 3.79 MIL/uL — ABNORMAL LOW (ref 3.87–5.11)
RDW: 13 % (ref 11.5–15.5)
WBC: 8.5 10*3/uL (ref 4.0–10.5)
nRBC: 0 % (ref 0.0–0.2)

## 2020-10-10 LAB — COMPREHENSIVE METABOLIC PANEL
ALT: 22 U/L (ref 0–44)
AST: 23 U/L (ref 15–41)
Albumin: 3.9 g/dL (ref 3.5–5.0)
Alkaline Phosphatase: 126 U/L (ref 38–126)
Anion gap: 22 — ABNORMAL HIGH (ref 5–15)
BUN: 11 mg/dL (ref 8–23)
CO2: 17 mmol/L — ABNORMAL LOW (ref 22–32)
Calcium: 9.1 mg/dL (ref 8.9–10.3)
Chloride: 94 mmol/L — ABNORMAL LOW (ref 98–111)
Creatinine, Ser: 0.68 mg/dL (ref 0.44–1.00)
GFR, Estimated: >60 mL/min (ref >60)
Glucose, Bld: 119 mg/dL — ABNORMAL HIGH (ref 70–99)
Potassium: 3.6 mmol/L (ref 3.5–5.1)
Sodium: 133 mmol/L — ABNORMAL LOW (ref 135–145)
Total Bilirubin: 1.3 mg/dL — ABNORMAL HIGH (ref 0.3–1.2)
Total Protein: 8 g/dL (ref 6.5–8.1)

## 2020-10-10 LAB — RESP PANEL BY RT-PCR (FLU A&B, COVID) ARPGX2
Influenza A by PCR: NEGATIVE
Influenza B by PCR: NEGATIVE
SARS Coronavirus 2 by RT PCR: NEGATIVE

## 2020-10-10 LAB — LIPASE, BLOOD: Lipase: 27 U/L (ref 11–51)

## 2020-10-10 LAB — LACTIC ACID, PLASMA: Lactic Acid, Venous: 0.9 mmol/L (ref 0.5–1.9)

## 2020-10-10 LAB — APTT: aPTT: 28 seconds (ref 24–36)

## 2020-10-10 LAB — PROTIME-INR
INR: 0.9 (ref 0.8–1.2)
Prothrombin Time: 11.9 seconds (ref 11.4–15.2)

## 2020-10-10 MED ORDER — POTASSIUM CHLORIDE IN NACL 20-0.9 MEQ/L-% IV SOLN
INTRAVENOUS | Status: DC
  Filled 2020-10-10: qty 1000

## 2020-10-10 MED ORDER — PROCHLORPERAZINE EDISYLATE 10 MG/2ML IJ SOLN
5.0000 mg | INTRAMUSCULAR | Status: DC | PRN
  Administered 2020-10-11 – 2020-10-13 (×6): 5 mg via INTRAVENOUS
  Filled 2020-10-10 (×6): qty 2

## 2020-10-10 MED ORDER — HYDROMORPHONE HCL 1 MG/ML IJ SOLN
0.5000 mg | Freq: Once | INTRAMUSCULAR | Status: AC
  Administered 2020-10-10: 0.5 mg via INTRAVENOUS
  Filled 2020-10-10: qty 1

## 2020-10-10 MED ORDER — SODIUM CHLORIDE 0.9 % IV BOLUS
1000.0000 mL | Freq: Once | INTRAVENOUS | Status: AC
  Administered 2020-10-10: 1000 mL via INTRAVENOUS

## 2020-10-10 MED ORDER — ONDANSETRON HCL 4 MG/2ML IJ SOLN
4.0000 mg | Freq: Once | INTRAMUSCULAR | Status: AC
  Administered 2020-10-10: 4 mg via INTRAVENOUS
  Filled 2020-10-10: qty 2

## 2020-10-10 NOTE — ED Provider Notes (Signed)
[facility] EMERGENCY DEPARTMENT Provider Note   CSN: [id number] Arrival date & time: 10/10/20  2030     History No chief complaint on file.   Jasmine [name] is a 65 y.o. female.  Patient states she has been having vomiting and diarrhea for 3 days.  She feels very weak.  She has been coughing no fever.  No blood in her diarrhea or vomit  The history is provided by the patient and medical records. No language interpreter was used.  Emesis Severity:  Moderate Duration:  3 days Timing:  Constant Quality:  Undigested food Progression:  Worsening Chronicity:  New Recent urination:  Normal Context: not post-tussive   Relieved by:  Nothing Ineffective treatments:  None tried Associated symptoms: diarrhea   Associated symptoms: no abdominal pain, no cough and no headaches       Past Medical History:  Diagnosis Date   Allergy    Anemia    Anxiety    Arthritis    Asthma    Carpal tunnel syndrome    Chronic back pain    Cigarette smoker 07/09/3974   Complication of anesthesia    in the past has had N/V, the last few surgeries she has not been   COPD (chronic obstructive pulmonary disease) ([place])    Easy bruising 07/10/2016   GERD (gastroesophageal reflux disease)    Headache    Heart murmur 2005   never had any problems   History of blood transfusion 2018   History of kidney stones    Hypertension    Hypoglycemia    Hypoglycemia    Iron deficiency anemia 07/11/2016   Neuropathy    both arms   Normocytic anemia 07/29/2015   Pancreatitis    Pneumonia    PONV (postoperative nausea and vomiting)    Postoperative anemia due to acute blood loss 11/15/2015   R lung cancer    Sciatica    Seizures ([place])    as a child when she would have an asthma attack. none as an adult   Shortness of breath dyspnea     Patient Active Problem List   Diagnosis Date Noted   Acute gastroenteritis 10/10/2020   Protein-calorie malnutrition, severe 08/26/2020   Chest pain 08/25/2020    Pleuritic chest pain 08/25/2020   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   GERD (gastroesophageal reflux disease) 04/22/2020   Abnormal CT of the abdomen 04/22/2020   Preventative health care 04/22/2020   Solitary pulmonary nodule 03/22/2020   Pulmonary infiltrate on chest x-ray 02/24/2020   Nocturnal cough 02/19/2020   Pancreatitis, acute 01/06/2019   Acute pancreatitis 01/05/2019   Cigarette smoker 08/25/2017   Chronic respiratory failure with hypoxia ([place]) 08/25/2017   Centrilobular emphysema ([place]) 07/09/2017   Aortic atherosclerosis ([place]) 07/09/2017   Hepatic steatosis 07/09/2017   Malnutrition of moderate degree 07/02/2017   Acute respiratory failure with hypoxia ([place]) 06/30/2017   COPD with acute exacerbation ([place]) 06/30/2017   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/2016   Tobacco abuse 08/04/2016   Iron deficiency anemia 07/11/2016   Easy bruising 07/10/2016   Lumbar stenosis 11/11/2015   Leg pain 09/24/2015   Vitamin D deficiency 07/29/2015   Chronic back pain 02/22/2015   HTN (hypertension) 02/22/2015   Chronic narcotic use 02/22/2015   COPD GOLD II  02/22/2015   Headache 02/22/2015    Past Surgical History:  Procedure Laterality Date   ABDOMINAL HYSTERECTOMY  7341   APPLICATION OF ROBOTIC ASSISTANCE FOR SPINAL PROCEDURE N/A 09/05/2016  Procedure: APPLICATION OF ROBOTIC ASSISTANCE FOR SPINAL PROCEDURE;  Surgeon: Consuella Lose, MD;  Location: [place];  Service: Neurosurgery;  Laterality: N/A;   BACK SURGERY     BIOPSY  06/14/2020   Procedure: BIOPSY;  Surgeon: Daneil Dolin, MD;  [facility];  Service: Endoscopy;;   [place]   COLONOSCOPY     unsure last   COLONOSCOPY WITH PROPOFOL N/A 06/14/2020   Procedure: COLONOSCOPY WITH PROPOFOL;  Surgeon: Daneil Dolin, MD;  [facility];  Service: Endoscopy;  Laterality: N/A;  PM   cyst removal face     disk repair     neck and lumbar region    ESOPHAGOGASTRODUODENOSCOPY (EGD) WITH PROPOFOL N/A 06/14/2020   Procedure: ESOPHAGOGASTRODUODENOSCOPY (EGD) WITH PROPOFOL;  Surgeon: Daneil Dolin, MD;  [facility];  Service: Endoscopy;  Laterality: N/A;   FOOT SURGERY Bilateral    to file bones down    HARDWARE REMOVAL N/A 09/05/2016   Procedure: HARDWARE REMOVAL AND REPLACEMENT OF LUMBAR FIVE SCREWS;  Surgeon: Consuella Lose, MD;  Location: [place];  Service: Neurosurgery;  Laterality: N/A;   IMPLANTATION / PLACEMENT EPIDURAL NEUROSTIMULATOR ELECTRODES     INTERCOSTAL NERVE BLOCK Right 07/29/2020   Procedure: INTERCOSTAL NERVE BLOCK;  Surgeon: Melrose Nakayama, MD;  Location: [place];  Service: Thoracic;  Laterality: Right;   LAMINECTOMY     2006   lobectomy Right 07/2020   LUMBAR FUSION     LYMPH NODE BIOPSY Right 07/29/2020   Procedure: LYMPH NODE BIOPSY;  Surgeon: Melrose Nakayama, MD;  Location: [place];  Service: Thoracic;  Laterality: Right;   POLYPECTOMY  06/14/2020   Procedure: POLYPECTOMY INTESTINAL;  Surgeon: Daneil Dolin, MD;  [facility];  Service: Endoscopy;;   SPINAL CORD STIMULATOR BATTERY EXCHANGE     SPINAL CORD STIMULATOR INSERTION N/A 07/05/2016   Procedure: REPLACEMENT OF LUMBAR SPINAL CORD STIMULATOR BATTERY;  Surgeon: Newman Pies, MD;  Location: [place];  Service: Neurosurgery;  Laterality: N/A;   TONSILLECTOMY       OB History   No obstetric history on file.     Family History  Problem Relation Age of Onset   Heart disease Mother    Diabetes Mother    Hypertension Mother    Hyperlipidemia Mother    COPD Mother        smoked   Cancer Mother        bile duct   COPD Father    Diabetes Father    Heart disease Father    Cancer Father        throat   Dementia Father    Asthma Son    Hypertension Sister    Hyperlipidemia Sister    Hypertension Brother    Hyperlipidemia Brother    Hypertension Brother    Hyperlipidemia Brother    Emphysema Maternal Grandmother         smoked   Colon cancer Neg Hx    Colon polyps Neg Hx     Social History   Tobacco Use   Smoking status: Every Day    Packs/day: 0.25    Years: 35.00    Pack years: 8.75    Types: Cigarettes   Smokeless tobacco: Never   Tobacco comments:    trying to quit  Vaping Use   Vaping Use: Never used  Substance Use Topics   Alcohol use: Yes    Alcohol/week:  2.0 standard drinks    Types: 2 Glasses of wine per week    Comment: occasionally   Drug use: No    Home Medications Prior to Admission medications   Medication Sig Start Date End Date Taking? Authorizing Provider  albuterol (VENTOLIN HFA) 108 (90 Base) MCG/ACT inhaler Inhale into the lungs every 6 (six) hours as needed for wheezing or shortness of breath.    [provider]  amLODipine (NORVASC) 10 MG tablet Take 1 tablet (10 mg total) by mouth daily. 07/09/20   Janora Norlander, DO  atorvastatin (LIPITOR) 40 MG tablet Take 1 tablet (40 mg total) by mouth daily. 07/09/20   Ronnie Doss M, DO  budesonide-formoterol (SYMBICORT) 160-4.5 MCG/ACT inhaler USE 2 INHALATIONS TWICE A DAY 05/05/20   Ronnie Doss M, DO  Cholecalciferol 125 MCG (5000 UT) capsule Take 5,000 Units by mouth daily.    [provider]  dextromethorphan (DELSYM) 30 MG/5ML liquid Take 5 mLs (30 mg total) by mouth every 8 (eight) hours as needed for cough. 08/26/20   Barton Dubois, MD  diclofenac sodium (VOLTAREN) 1 % GEL Apply 1 application topically daily as needed for muscle pain. 06/30/16   [provider]  diclofenac Sodium (VOLTAREN) 1 % GEL Apply 2 g topically 4 (four) times daily. 08/27/20   [provider]  DULoxetine (CYMBALTA) 30 MG capsule Take 3 capsules (90 mg total) by mouth daily. 07/09/20   Janora Norlander, DO  ipratropium-albuterol (DUONEB) 0.5-2.5 (3) MG/3ML SOLN Inhale 3 mLs into the lungs 4 (four) times daily as needed (for shortness of breath/wheezing). *May use as needed 02/21/16   [provider]  Iron Polysacch Cmplx-B12-FA (POLY-IRON 150 FORTE) 150-0.025-1 MG CAPS Take 1 capsule by mouth 2 (two) times daily. 05/27/20   Derek Jack, MD  L-Methylfolate-Algae-B12-B6 [facility]) 3-90.314-2-35 MG CAPS TAKE 1 TABLET BY MOUTH TWO TIMES A DAY 09/10/19   Ronnie Doss M, DO  magnesium oxide (MAG-OX) 400 (241.3 Mg) MG tablet Take 400 mg by mouth 2 (two) times daily. 02/07/20   [provider]  nicotine (NICODERM CQ) 14 mg/24hr patch Place 1 patch (14 mg total) onto the skin daily. 06/23/20   Janora Norlander, DO  Nutritional Supplements (ENSURE HIGH PROTEIN) LIQD Drink 1 can 3 times daily. 08/30/20   Janora Norlander, DO  omeprazole (PRILOSEC) 40 MG capsule Take 1 capsule (40 mg total) by mouth 2 (two) times daily. Take 1 capsule 30 mins before breakfast and evening meal. 04/27/20   Carlis Stable, NP  ondansetron (ZOFRAN ODT) 4 MG disintegrating tablet Take 1 tablet (4 mg total) by mouth every 8 (eight) hours as needed for nausea or vomiting. 09/12/20   Maudie Flakes, MD  oxyCODONE-acetaminophen (PERCOCET) 10-325 MG tablet Take 1 tablet by mouth 5 (five) times daily. 08/27/20   [provider]  OXYCONTIN 15 MG 12 hr tablet SMARTSIG:1 Tablet(s) By Mouth Every 12 Hours 08/28/20   [provider]  OXYGEN Inhale 3 L into the lungs See admin instructions. continuous    [provider]  predniSONE (DELTASONE) 20 MG tablet 2 po at same time daily for 5 days Patient not taking: Reported on 10/05/2020 09/15/20   Dettinger, Fransisca Kaufmann, MD  SPIRIVA RESPIMAT 2.5 MCG/ACT AERS USE 2 INHALATIONS DAILY 07/13/20   Tanda Rockers, MD  tizanidine (ZANAFLEX) 6 MG capsule Take 6 mg by mouth 3 (three) times daily. 08/27/20   [provider]  traZODone (DESYREL) 150 MG tablet TAKE 1  TO 2 TABLETS AT BEDTIME 02/02/20   Ronnie Doss M, DO    Allergies    Lyrica [pregabalin], Darvon [propoxyphene], Gabapentin, Augmentin [amoxicillin-pot clavulanate],  Erythromycin, and Vibramycin [doxycycline calcium]  Review of Systems   Review of Systems  Constitutional:  Negative for appetite change and fatigue.  HENT:  Negative for congestion, ear discharge and sinus pressure.   Eyes:  Negative for discharge.  Respiratory:  Negative for cough.   Cardiovascular:  Negative for chest pain.  Gastrointestinal:  Positive for diarrhea and vomiting. Negative for abdominal pain.  Genitourinary:  Negative for frequency and hematuria.  Musculoskeletal:  Negative for back pain.  Skin:  Negative for rash.  Neurological:  Negative for seizures and headaches.  Psychiatric/Behavioral:  Negative for hallucinations.    Physical Exam Updated Vital Signs BP (!) 144/84   Pulse (!) 101   Temp 98.4 F (36.9 C) (Oral)   Resp 16   Ht 5\' 3"  (1.6 m)   Wt 44 kg   SpO2 100%   BMI 17.18 kg/m   Physical Exam Vitals and nursing note reviewed.  Constitutional:      Appearance: She is well-developed.  HENT:     Head: Normocephalic.     Mouth/Throat:     Mouth: Mucous membranes are moist.  Eyes:     General: No scleral icterus.    Conjunctiva/sclera: Conjunctivae normal.  Neck:     Thyroid: No thyromegaly.  Cardiovascular:     Rate and Rhythm: Normal rate and regular rhythm.     Heart sounds: No murmur heard.   No friction rub. No gallop.  Pulmonary:     Breath sounds: No stridor. No wheezing or rales.  Chest:     Chest wall: No tenderness.  Abdominal:     General: There is no distension.     Tenderness: There is no abdominal tenderness. There is no rebound.     Comments: Minimal tenderness  Musculoskeletal:        General: Normal range of motion.     Cervical back: Neck supple.  Lymphadenopathy:     Cervical: No cervical adenopathy.  Skin:    Findings: No erythema or rash.  Neurological:     Mental Status: She is alert and oriented to person, place, and time.     Motor: No abnormal muscle tone.     Coordination: Coordination normal.   Psychiatric:        Behavior: Behavior normal.    ED Results / Procedures / Treatments   Labs (all labs ordered are listed, but only abnormal results are displayed) Labs Reviewed  COMPREHENSIVE METABOLIC PANEL - Abnormal; Notable for the following components:      Result Value   Sodium 133 (*)    Chloride 94 (*)    CO2 17 (*)    Glucose, Bld 119 (*)    Total Bilirubin 1.3 (*)    Anion gap 22 (*)    All other components within normal limits  CBC - Abnormal; Notable for the following components:   RBC 3.79 (*)    MCV 105.3 (*)    All other components within normal limits  RESP PANEL BY RT-PCR (FLU A&B, COVID) ARPGX2  CULTURE, BLOOD (SINGLE)  LIPASE, BLOOD  LACTIC ACID, PLASMA  PROTIME-INR  APTT  URINALYSIS, ROUTINE W REFLEX MICROSCOPIC  LACTIC ACID, PLASMA    EKG None  Radiology DG ABD ACUTE 2+V W 1V CHEST  Result Date: 10/10/2020 CLINICAL DATA:  Nausea and vomiting with shortness  of breath, initial encounter EXAM: DG ABDOMEN ACUTE WITH 1 VIEW CHEST COMPARISON:  10/05/2020, 08/06/2020 FINDINGS: Cardiac shadow is stable. Spinal stimulator is again noted. Postsurgical changes are noted in the right lung with mild volume loss. No focal infiltrate or effusion is seen. Mild chronic interstitial changes are seen. Scattered large and small bowel gas is noted. No free air is noted. Postsurgical changes in the lower lumbosacral spine are seen. No acute fracture is noted. No soft tissue abnormality is seen. IMPRESSION: Chronic changes in the chest and abdomen without acute abnormality. Electronically Signed   By: Inez Catalina M.D.   On: 10/10/2020 21:36    Procedures Procedures   Medications Ordered in ED Medications  sodium chloride 0.9 % bolus 1,000 mL (1,000 mLs Intravenous New Bag/Given 10/10/20 2136)  ondansetron [facility]) injection 4 mg (4 mg Intravenous Given 10/10/20 2134)  HYDROmorphone (DILAUDID) injection 0.5 mg (0.5 mg Intravenous Given 10/10/20 2134)    ED Course  I have  reviewed the triage vital signs and the nursing notes.  Pertinent labs & imaging results that were available during my care of the patient were reviewed by me and considered in my medical decision making (see chart for details).    MDM Rules/Calculators/A&P                           Patient with persistent nausea vomiting diarrhea with dehydration and acidosis.  She will be admitted to medicine Final Clinical Impression(s) / ED Diagnoses Final diagnoses:  Acidosis  Dehydration    Rx / DC Orders ED Discharge Orders     None        Milton Ferguson, MD 10/17/20 [phone]

## 2020-10-10 NOTE — H&P (Signed)
History and Physical    Jasmine [name] PZW:[id number] DOB: 24-Apr-1955 DOA: 10/10/2020  PCP: Janora Norlander, DO   Patient coming from: Home.  I have personally briefly reviewed patient's old medical records in [place]  Chief Complaint: Nausea vomiting and diarrhea.  HPI: Jasmine [name] is a 65 y.o. female with medical history significant of seasonal allergies, normocytic anemia, history of blood transfusion, anxiety, osteoarthritis, COPD, active light smoker, chronic back pain, easy bruising, GERD, headache, nephrolithiasis, hypertension, hyperglycemia, neuropathy, normocytic anemia, history of pancreatitis, right lung cancer, sciatica, seizures as a child who is coming to the emergency department with complaints of numerous episodes of nausea/emesis and diarrhea associated with subjective fever at home since Thursday.  Denies any constipation, melena or hematochezia.  No dysuria, flank pain, frequency or hematuria.  She denied travel history or sick contacts.  She had a COVID test at home that was negative.  No rhinorrhea, sore throat, hemoptysis, chest pain, palpitations, diaphoresis, PND, orthopnea or pitting edema lower extremities.  She has felt lightheaded on occasion.  No polyuria, polydipsia, polyphagia or blurred vision.  ED Course:  Initial vital signs were temperature 98.4 F, pulse 10 17, respiration 18, BP 175/99 mmHg O2 sat 100%On 3.5 L via nasal cannula.  The patient received Zofran 4 mg IVP x2, hydromorphone 0.5 mg IVP x1.  I added Protonix 40 mg IVP, dextrose 12.5 g, potassium supplementation, bicarbonate 1 amp IVP, dextrose gel and temporary low-dose insulin infusion.  Lab work: Her urinalysis showed ketonuria of 80 and proteinuria 100 mg/dL.  CBC with a white count of 8.5, hemoglobin 4.0 g/dL platelets 344.   PT was 11.9 CMP showed a sodium of 133, potassium 3.6, chloride 94 and CO2 17 mmol/L.  Glucose 119 and total bilirubin 1.3 mg/dL.  The rest of the CMP values  were normal.  Lipase was normal., INR 0.9 and PTT 28.  Lactic acid was normal.  Beta hydroxybutyric acid was more than 8.00 mmol/L.  Imaging: Three-way abdomen showed chronic changes in the chest and abdomen without acute abnormality.  Please see images and full daily report for further detail.  Review of Systems: As per HPI otherwise all other systems reviewed and are negative.  Past Medical History:  Diagnosis Date   Allergy    Anemia    Anxiety    Arthritis    Asthma    Carpal tunnel syndrome    Chronic back pain    Cigarette smoker 06/27/5359   Complication of anesthesia    in the past has had N/V, the last few surgeries she has not been   COPD (chronic obstructive pulmonary disease) ([place])    Easy bruising 07/10/2016   GERD (gastroesophageal reflux disease)    Headache    Heart murmur 2005   never had any problems   History of blood transfusion 2018   History of kidney stones    Hypertension    Hypoglycemia    Hypoglycemia    Iron deficiency anemia 07/11/2016   Neuropathy    both arms   Normocytic anemia 07/29/2015   Pancreatitis    Pneumonia    PONV (postoperative nausea and vomiting)    Postoperative anemia due to acute blood loss 11/15/2015   R lung cancer    Sciatica    Seizures ([place])    as a child when she would have an asthma attack. none as an adult   Shortness of breath dyspnea    Past Surgical History:  Procedure Laterality  Date   ABDOMINAL HYSTERECTOMY  1700   APPLICATION OF ROBOTIC ASSISTANCE FOR SPINAL PROCEDURE N/A 09/05/2016   Procedure: APPLICATION OF ROBOTIC ASSISTANCE FOR SPINAL PROCEDURE;  Surgeon: Consuella Lose, MD;  Location: [place];  Service: Neurosurgery;  Laterality: N/A;   BACK SURGERY     BIOPSY  06/14/2020   Procedure: BIOPSY;  Surgeon: Daneil Dolin, MD;  [facility];  Service: Endoscopy;;   [place]   COLONOSCOPY     unsure last   COLONOSCOPY WITH PROPOFOL N/A 06/14/2020   Procedure:  COLONOSCOPY WITH PROPOFOL;  Surgeon: Daneil Dolin, MD;  [facility];  Service: Endoscopy;  Laterality: N/A;  PM   cyst removal face     disk repair     neck and lumbar region   ESOPHAGOGASTRODUODENOSCOPY (EGD) WITH PROPOFOL N/A 06/14/2020   Procedure: ESOPHAGOGASTRODUODENOSCOPY (EGD) WITH PROPOFOL;  Surgeon: Daneil Dolin, MD;  [facility];  Service: Endoscopy;  Laterality: N/A;   FOOT SURGERY Bilateral    to file bones down    HARDWARE REMOVAL N/A 09/05/2016   Procedure: HARDWARE REMOVAL AND REPLACEMENT OF LUMBAR FIVE SCREWS;  Surgeon: Consuella Lose, MD;  Location: [place];  Service: Neurosurgery;  Laterality: N/A;   IMPLANTATION / PLACEMENT EPIDURAL NEUROSTIMULATOR ELECTRODES     INTERCOSTAL NERVE BLOCK Right 07/29/2020   Procedure: INTERCOSTAL NERVE BLOCK;  Surgeon: Melrose Nakayama, MD;  Location: [place];  Service: Thoracic;  Laterality: Right;   LAMINECTOMY     2006   lobectomy Right 07/2020   LUMBAR FUSION     LYMPH NODE BIOPSY Right 07/29/2020   Procedure: LYMPH NODE BIOPSY;  Surgeon: Melrose Nakayama, MD;  Location: [place];  Service: Thoracic;  Laterality: Right;   POLYPECTOMY  06/14/2020   Procedure: POLYPECTOMY INTESTINAL;  Surgeon: Daneil Dolin, MD;  [facility];  Service: Endoscopy;;   SPINAL CORD STIMULATOR BATTERY EXCHANGE     SPINAL CORD STIMULATOR INSERTION N/A 07/05/2016   Procedure: REPLACEMENT OF LUMBAR SPINAL CORD STIMULATOR BATTERY;  Surgeon: Newman Pies, MD;  Location: [place];  Service: Neurosurgery;  Laterality: N/A;   TONSILLECTOMY     Social History  reports that she has been smoking cigarettes. She has a 8.75 pack-year smoking history. She has never used smokeless tobacco. She reports current alcohol use of about 2.0 standard drinks of alcohol per week. She reports that she does not use drugs.  Allergies  Allergen Reactions   Lyrica [Pregabalin] Other (See Comments)    EXTREME SHAKING/TREMBLING    Darvon [Propoxyphene]     UNSPECIFIED REACTION    Gabapentin     Extreme shaking and trembling    Augmentin [Amoxicillin-Pot Clavulanate] Nausea And Vomiting    Has patient had a PCN reaction causing immediate rash, facial/tongue/throat swelling, SOB or lightheadedness with hypotension:no Has patient had a PCN reaction causing severe rash involving mucus membranes or skin necrosis:No Has patient had a PCN reaction that required hospitalization:No Has patient had a PCN reaction occurring within the last 10 years:Yes--ONLY N/V If all of the above answers are "NO", then may proceed with Cephalosporin use.    Erythromycin Itching and Rash        Vibramycin [Doxycycline Calcium] Itching and Rash    "Wylie "    Family History  Problem Relation Age of Onset   Heart disease Mother    Diabetes Mother    Hypertension Mother  Hyperlipidemia Mother    COPD Mother        smoked   Cancer Mother        bile duct   COPD Father    Diabetes Father    Heart disease Father    Cancer Father        throat   Dementia Father    Asthma Son    Hypertension Sister    Hyperlipidemia Sister    Hypertension Brother    Hyperlipidemia Brother    Hypertension Brother    Hyperlipidemia Brother    Emphysema Maternal Grandmother        smoked   Colon cancer Neg Hx    Colon polyps Neg Hx    Prior to Admission medications   Medication Sig Start Date End Date Taking? Authorizing Provider  albuterol (VENTOLIN HFA) 108 (90 Base) MCG/ACT inhaler Inhale into the lungs every 6 (six) hours as needed for wheezing or shortness of breath.    [provider]  amLODipine (NORVASC) 10 MG tablet Take 1 tablet (10 mg total) by mouth daily. 07/09/20   Janora Norlander, DO  atorvastatin (LIPITOR) 40 MG tablet Take 1 tablet (40 mg total) by mouth daily. 07/09/20   Ronnie Doss M, DO  budesonide-formoterol (SYMBICORT) 160-4.5 MCG/ACT inhaler USE 2 INHALATIONS TWICE A DAY 05/05/20   Ronnie Doss M,  DO  Cholecalciferol 125 MCG (5000 UT) capsule Take 5,000 Units by mouth daily.    [provider]  dextromethorphan (DELSYM) 30 MG/5ML liquid Take 5 mLs (30 mg total) by mouth every 8 (eight) hours as needed for cough. 08/26/20   Barton Dubois, MD  diclofenac sodium (VOLTAREN) 1 % GEL Apply 1 application topically daily as needed for muscle pain. 06/30/16   [provider]  diclofenac Sodium (VOLTAREN) 1 % GEL Apply 2 g topically 4 (four) times daily. 08/27/20   [provider]  DULoxetine (CYMBALTA) 30 MG capsule Take 3 capsules (90 mg total) by mouth daily. 07/09/20   Janora Norlander, DO  ipratropium-albuterol (DUONEB) 0.5-2.5 (3) MG/3ML SOLN Inhale 3 mLs into the lungs 4 (four) times daily as needed (for shortness of breath/wheezing). *May use as needed 02/21/16   [provider]  Iron Polysacch Cmplx-B12-FA (POLY-IRON 150 FORTE) 150-0.025-1 MG CAPS Take 1 capsule by mouth 2 (two) times daily. 05/27/20   Derek Jack, MD  L-Methylfolate-Algae-B12-B6 [facility]) 3-90.314-2-35 MG CAPS TAKE 1 TABLET BY MOUTH TWO TIMES A DAY 09/10/19   Ronnie Doss M, DO  magnesium oxide (MAG-OX) 400 (241.3 Mg) MG tablet Take 400 mg by mouth 2 (two) times daily. 02/07/20   [provider]  nicotine (NICODERM CQ) 14 mg/24hr patch Place 1 patch (14 mg total) onto the skin daily. 06/23/20   Janora Norlander, DO  Nutritional Supplements (ENSURE HIGH PROTEIN) LIQD Drink 1 can 3 times daily. 08/30/20   Janora Norlander, DO  omeprazole (PRILOSEC) 40 MG capsule Take 1 capsule (40 mg total) by mouth 2 (two) times daily. Take 1 capsule 30 mins before breakfast and evening meal. 04/27/20   Carlis Stable, NP  ondansetron (ZOFRAN ODT) 4 MG disintegrating tablet Take 1 tablet (4 mg total) by mouth every 8 (eight) hours as needed for nausea or vomiting. 09/12/20   Maudie Flakes, MD  oxyCODONE-acetaminophen (PERCOCET) 10-325 MG tablet Take 1 tablet by mouth 5 (five) times daily.  08/27/20   [provider]  OXYCONTIN 15 MG 12 hr tablet SMARTSIG:1 Tablet(s) By Mouth Every 12 Hours  08/28/20   [provider]  OXYGEN Inhale 3 L into the lungs See admin instructions. continuous    [provider]  predniSONE (DELTASONE) 20 MG tablet 2 po at same time daily for 5 days Patient not taking: Reported on 10/05/2020 09/15/20   Dettinger, Fransisca Kaufmann, MD  SPIRIVA RESPIMAT 2.5 MCG/ACT AERS USE 2 INHALATIONS DAILY 07/13/20   Tanda Rockers, MD  tizanidine (ZANAFLEX) 6 MG capsule Take 6 mg by mouth 3 (three) times daily. 08/27/20   [provider]  traZODone (DESYREL) 150 MG tablet TAKE 1 TO 2 TABLETS AT BEDTIME 02/02/20   Janora Norlander, DO   Physical Exam: Vitals:   10/10/20 2100 10/10/20 2130 10/10/20 2200 10/10/20 2230  BP: (!) 168/102 (!) 163/99 (!) 173/99 (!) 144/84  Pulse: (!) 117 (!) 118 (!) 114 (!) 101  Resp: (!) 22 16 15 16   Temp:      TempSrc:      SpO2: 100% 100% 100% 100%  Weight:      Height:       Constitutional: Chronically ill-appearing.  Under nourished.  NAD, calm, comfortable Eyes: PERRL, lids and conjunctivae normal.  Injected sclera. ENMT: Mucous membranes are dry.  Posterior pharynx clear of any exudate or lesions. Neck: normal, supple, no masses, no thyromegaly Respiratory: clear to auscultation bilaterally, no wheezing, no crackles. Normal respiratory effort. No accessory muscle use.  Cardiovascular: Sinus tachycardia in the 110s, no murmurs / rubs / gallops. No extremity edema. 2+ pedal pulses. No carotid bruits.  Abdomen: No distention.  Bowel sounds positive.  Soft, positive for epigastric tenderness, no masses palpated. No hepatosplenomegaly.   Musculoskeletal: Mild to moderate generalized weakness.  No clubbing / cyanosis. Good ROM, no contractures. Normal muscle tone.  Skin: no rashes, lesions, ulcers on very limited dermatological semination. Neurologic: CN 2-12 grossly intact. Sensation intact, DTR normal.  Strength 5/5 in all 4.  Psychiatric: Normal judgment and insight. Alert and oriented x 3. Normal mood.   Labs on Admission: I have personally reviewed following labs and imaging studies  CBC: Recent Labs  Lab 10/10/20 2104  WBC 8.5  HGB 12.0  HCT 39.9  MCV 105.3*  PLT 664   Basic Metabolic Panel: Recent Labs  Lab 10/10/20 2104  NA 133*  K 3.6  CL 94*  CO2 17*  GLUCOSE 119*  BUN 11  CREATININE 0.68  CALCIUM 9.1   GFR: Estimated Creatinine Clearance: 47.4 mL/min (by C-G formula based on SCr of 0.68 mg/dL).  Liver Function Tests: Recent Labs  Lab 10/10/20 2104  AST 23  ALT 22  ALKPHOS 126  BILITOT 1.3*  PROT 8.0  ALBUMIN 3.9   Radiological Exams on Admission: DG ABD ACUTE 2+V W 1V CHEST  Result Date: 10/10/2020 CLINICAL DATA:  Nausea and vomiting with shortness of breath, initial encounter EXAM: DG ABDOMEN ACUTE WITH 1 VIEW CHEST COMPARISON:  10/05/2020, 08/06/2020 FINDINGS: Cardiac shadow is stable. Spinal stimulator is again noted. Postsurgical changes are noted in the right lung with mild volume loss. No focal infiltrate or effusion is seen. Mild chronic interstitial changes are seen. Scattered large and small bowel gas is noted. No free air is noted. Postsurgical changes in the lower lumbosacral spine are seen. No acute fracture is noted. No soft tissue abnormality is seen. IMPRESSION: Chronic changes in the chest and abdomen without acute abnormality. Electronically Signed   By: Inez Catalina M.D.   On: 10/10/2020 21:36    Echocardiogram on 06/02/2020.  IMPRESSIONS:  1. Left ventricular ejection fraction, by estimation, is 60 to 65%. The  left ventricle has normal function. The left ventricle has no regional  wall motion abnormalities. Left ventricular diastolic parameters are  consistent with Grade I diastolic  dysfunction (impaired relaxation).   2. Right ventricular systolic function is normal. The right ventricular  size is mildly enlarged.   3. The mitral  valve is normal in structure. Mild mitral valve  regurgitation. No evidence of mitral stenosis.   4. The aortic valve has an indeterminant number of cusps. Aortic valve  regurgitation is not visualized. No aortic stenosis is present.   5. Indeterminant PASP, inadequate TR jet.   6. The inferior vena cava is normal in size with greater than 50%  respiratory variability, suggesting right atrial pressure of 3 mmHg.   EKG: Independently reviewed.  Vent. rate 114 BPM PR interval 166 ms QRS duration 66 ms QT/QTcB 325/448 ms P-R-T axes 80 49 54 Sinus tachycardia Biatrial enlargement Probable anterior infarct, old Artifact in lead(s) II III aVR aVL aVF V1 V3 V4 V5 V6  Assessment/Plan Principal Problem:   Acute gastroenteritis Observation/telemetry. Continue IV fluids. Clear liquid diet. Follow CBC and chemistry.  Active Problems:   Nondiabetic ketosis After several days of nausea and vomiting. Continue IV fluids with dextrose. Continue dextrose gel. Follow beta HBA, anion gap, electrolytes. Replace as needed.    HTN (hypertension) Continue amlodipine 10 mg p.o. daily.    Aortic atherosclerosis (HCC) Continue atorvastatin 40 mg p.o. daily.    COPD GOLD II    Chronic respiratory failure with hypoxia (HCC) Continue supplemental oxygen. Continue Symbicort twice daily. Bronchodilators as needed.    GERD (gastroesophageal reflux disease) Continue PPI.   DVT prophylaxis: Lovenox SQ. Code Status:   Full code. Family Communication:   Disposition Plan:   Patient is from:  Home.  Anticipated DC to:  Home.  Anticipated DC date:  10/11/2020 or 10/12/2020.  Anticipated DC barriers: Clinical status. Consults called:   Admission status:  Observation/stepdown.  Severity of Illness:  Reubin Milan MD [facility]  How to contact the [facility] Attending or Consulting provider [place] or covering provider during after hours [place], for this patient?   Check the care team  in [facility] and look for a) attending/consulting [facility] provider listed and b) the [facility] team listed Log into [email or web address] and use [place]'s universal password to access. If you do not have the password, please contact the hospital operator. Locate the [facility] provider you are looking for under [facility] and page to a number that you can be directly reached. If you still have difficulty reaching the provider, please page the [facility] (Director on Call) for the Hospitalists listed on amion for assistance.  10/10/2020, 11:44 PM   This document was prepared using Dragon voice recognition software may contain some unintended transcription errors.

## 2020-10-10 NOTE — ED Triage Notes (Addendum)
Pt brought in via [facility] for c/o N/V/D, fevers x 2 days, increased SOB, and pain between shoulder blades and @ recent lobectomy site. States vomited "at least 7 times today". Pt given 4mg  Zofran IV en route.

## 2020-10-11 ENCOUNTER — Encounter (HOSPITAL_COMMUNITY): Payer: Self-pay

## 2020-10-11 ENCOUNTER — Encounter (HOSPITAL_COMMUNITY): Payer: Self-pay | Admitting: Internal Medicine

## 2020-10-11 ENCOUNTER — Inpatient Hospital Stay (HOSPITAL_COMMUNITY): Payer: Medicare Other

## 2020-10-11 DIAGNOSIS — I700 Atherosclerosis of aorta: Secondary | ICD-10-CM | POA: Diagnosis present

## 2020-10-11 DIAGNOSIS — Z87442 Personal history of urinary calculi: Secondary | ICD-10-CM | POA: Diagnosis not present

## 2020-10-11 DIAGNOSIS — J449 Chronic obstructive pulmonary disease, unspecified: Secondary | ICD-10-CM

## 2020-10-11 DIAGNOSIS — Z20822 Contact with and (suspected) exposure to covid-19: Secondary | ICD-10-CM | POA: Diagnosis present

## 2020-10-11 DIAGNOSIS — K219 Gastro-esophageal reflux disease without esophagitis: Secondary | ICD-10-CM | POA: Diagnosis present

## 2020-10-11 DIAGNOSIS — J9611 Chronic respiratory failure with hypoxia: Secondary | ICD-10-CM

## 2020-10-11 DIAGNOSIS — K529 Noninfective gastroenteritis and colitis, unspecified: Secondary | ICD-10-CM | POA: Diagnosis present

## 2020-10-11 DIAGNOSIS — E8889 Other specified metabolic disorders: Secondary | ICD-10-CM

## 2020-10-11 DIAGNOSIS — R569 Unspecified convulsions: Secondary | ICD-10-CM | POA: Diagnosis present

## 2020-10-11 DIAGNOSIS — E872 Acidosis: Secondary | ICD-10-CM | POA: Diagnosis present

## 2020-10-11 DIAGNOSIS — J302 Other seasonal allergic rhinitis: Secondary | ICD-10-CM | POA: Diagnosis present

## 2020-10-11 DIAGNOSIS — Z85118 Personal history of other malignant neoplasm of bronchus and lung: Secondary | ICD-10-CM | POA: Diagnosis not present

## 2020-10-11 DIAGNOSIS — F17210 Nicotine dependence, cigarettes, uncomplicated: Secondary | ICD-10-CM | POA: Diagnosis present

## 2020-10-11 DIAGNOSIS — J432 Centrilobular emphysema: Secondary | ICD-10-CM | POA: Diagnosis present

## 2020-10-11 DIAGNOSIS — I10 Essential (primary) hypertension: Secondary | ICD-10-CM

## 2020-10-11 DIAGNOSIS — Z90710 Acquired absence of both cervix and uterus: Secondary | ICD-10-CM | POA: Diagnosis not present

## 2020-10-11 DIAGNOSIS — G8929 Other chronic pain: Secondary | ICD-10-CM | POA: Diagnosis present

## 2020-10-11 DIAGNOSIS — E876 Hypokalemia: Secondary | ICD-10-CM | POA: Diagnosis not present

## 2020-10-11 DIAGNOSIS — Z9981 Dependence on supplemental oxygen: Secondary | ICD-10-CM | POA: Diagnosis not present

## 2020-10-11 DIAGNOSIS — E860 Dehydration: Secondary | ICD-10-CM | POA: Diagnosis present

## 2020-10-11 DIAGNOSIS — R809 Proteinuria, unspecified: Secondary | ICD-10-CM | POA: Diagnosis present

## 2020-10-11 DIAGNOSIS — F419 Anxiety disorder, unspecified: Secondary | ICD-10-CM | POA: Diagnosis present

## 2020-10-11 DIAGNOSIS — M1990 Unspecified osteoarthritis, unspecified site: Secondary | ICD-10-CM | POA: Diagnosis present

## 2020-10-11 DIAGNOSIS — Z825 Family history of asthma and other chronic lower respiratory diseases: Secondary | ICD-10-CM | POA: Diagnosis not present

## 2020-10-11 DIAGNOSIS — M549 Dorsalgia, unspecified: Secondary | ICD-10-CM | POA: Diagnosis present

## 2020-10-11 DIAGNOSIS — Z8249 Family history of ischemic heart disease and other diseases of the circulatory system: Secondary | ICD-10-CM | POA: Diagnosis not present

## 2020-10-11 DIAGNOSIS — R824 Acetonuria: Secondary | ICD-10-CM | POA: Diagnosis present

## 2020-10-11 LAB — BASIC METABOLIC PANEL
Anion gap: 14 (ref 5–15)
Anion gap: 9 (ref 5–15)
BUN: 9 mg/dL (ref 8–23)
BUN: 5 mg/dL — ABNORMAL LOW (ref 8–23)
CO2: 20 mmol/L — ABNORMAL LOW (ref 22–32)
CO2: 24 mmol/L (ref 22–32)
Calcium: 8.2 mg/dL — ABNORMAL LOW (ref 8.9–10.3)
Calcium: 8.2 mg/dL — ABNORMAL LOW (ref 8.9–10.3)
Chloride: 101 mmol/L (ref 98–111)
Chloride: 100 mmol/L (ref 98–111)
Creatinine, Ser: 0.57 mg/dL (ref 0.44–1.00)
Creatinine, Ser: 0.41 mg/dL — ABNORMAL LOW (ref 0.44–1.00)
GFR, Estimated: >60 mL/min (ref >60)
GFR, Estimated: >60 mL/min (ref >60)
Glucose, Bld: 109 mg/dL — ABNORMAL HIGH (ref 70–99)
Glucose, Bld: 234 mg/dL — ABNORMAL HIGH (ref 70–99)
Potassium: 3.6 mmol/L (ref 3.5–5.1)
Potassium: 2.9 mmol/L — ABNORMAL LOW (ref 3.5–5.1)
Sodium: 135 mmol/L (ref 135–145)
Sodium: 133 mmol/L — ABNORMAL LOW (ref 135–145)

## 2020-10-11 LAB — BLOOD GAS, VENOUS
Acid-base deficit: 5.1 mmol/L — ABNORMAL HIGH (ref 0.0–2.0)
Bicarbonate: 19.1 mmol/L — ABNORMAL LOW (ref 20.0–28.0)
FIO2: 32
O2 Saturation: 60.5 %
Patient temperature: 37
pCO2, Ven: 46.4 mmHg (ref 44.0–60.0)
pH, Ven: 7.27 (ref 7.250–7.430)
pO2, Ven: 37.1 mmHg (ref 32.0–45.0)

## 2020-10-11 LAB — URINALYSIS, ROUTINE W REFLEX MICROSCOPIC
Bacteria, UA: NONE SEEN
Bilirubin Urine: NEGATIVE
Glucose, UA: NEGATIVE mg/dL
Hgb urine dipstick: NEGATIVE
Ketones, ur: 80 mg/dL — AB
Leukocytes,Ua: NEGATIVE
Nitrite: NEGATIVE
Protein, ur: 100 mg/dL — AB
Specific Gravity, Urine: 1.011 (ref 1.005–1.030)
pH: 6 (ref 5.0–8.0)

## 2020-10-11 LAB — BETA-HYDROXYBUTYRIC ACID
Beta-Hydroxybutyric Acid: >8 mmol/L — ABNORMAL HIGH (ref 0.05–0.27)
Beta-Hydroxybutyric Acid: 5.78 mmol/L — ABNORMAL HIGH (ref 0.05–0.27)

## 2020-10-11 LAB — GLUCOSE, CAPILLARY
Glucose-Capillary: 164 mg/dL — ABNORMAL HIGH (ref 70–99)
Glucose-Capillary: 120 mg/dL — ABNORMAL HIGH (ref 70–99)
Glucose-Capillary: 90 mg/dL (ref 70–99)

## 2020-10-11 LAB — C DIFFICILE QUICK SCREEN W PCR REFLEX
C Diff antigen: NEGATIVE
C Diff interpretation: NOT DETECTED
C Diff toxin: NEGATIVE

## 2020-10-11 LAB — CBG MONITORING, ED
Glucose-Capillary: 185 mg/dL — ABNORMAL HIGH (ref 70–99)
Glucose-Capillary: 190 mg/dL — ABNORMAL HIGH (ref 70–99)

## 2020-10-11 LAB — MAGNESIUM: Magnesium: 1.8 mg/dL (ref 1.7–2.4)

## 2020-10-11 LAB — HEMOGLOBIN A1C
Hgb A1c MFr Bld: 5.1 % (ref 4.8–5.6)
Mean Plasma Glucose: 99.67 mg/dL

## 2020-10-11 MED ORDER — DIPHENOXYLATE-ATROPINE 2.5-0.025 MG PO TABS
1.0000 | ORAL_TABLET | Freq: Four times a day (QID) | ORAL | Status: DC | PRN
  Administered 2020-10-11: 1 via ORAL
  Filled 2020-10-11 (×2): qty 1

## 2020-10-11 MED ORDER — POTASSIUM CHLORIDE IN NACL 20-0.9 MEQ/L-% IV SOLN: INTRAVENOUS | Status: DC

## 2020-10-11 MED ORDER — BOOST / RESOURCE BREEZE PO LIQD CUSTOM
1.0000 | Freq: Three times a day (TID) | ORAL | Status: DC
  Administered 2020-10-11 – 2020-10-13 (×3): 1 via ORAL

## 2020-10-11 MED ORDER — ALBUTEROL SULFATE (2.5 MG/3ML) 0.083% IN NEBU: 2.5000 mg | INHALATION_SOLUTION | Freq: Four times a day (QID) | RESPIRATORY_TRACT | Status: DC | PRN

## 2020-10-11 MED ORDER — METOPROLOL TARTRATE 5 MG/5ML IV SOLN
5.0000 mg | Freq: Once | INTRAVENOUS | Status: AC
  Administered 2020-10-11: 5 mg via INTRAVENOUS
  Filled 2020-10-11: qty 5

## 2020-10-11 MED ORDER — ATORVASTATIN CALCIUM 40 MG PO TABS
40.0000 mg | ORAL_TABLET | Freq: Every evening | ORAL | Status: DC
  Administered 2020-10-11 – 2020-10-12 (×2): 40 mg via ORAL
  Filled 2020-10-11 (×2): qty 1

## 2020-10-11 MED ORDER — AMLODIPINE BESYLATE 5 MG PO TABS
10.0000 mg | ORAL_TABLET | Freq: Every day | ORAL | Status: DC
  Administered 2020-10-11 – 2020-10-14 (×4): 10 mg via ORAL
  Filled 2020-10-11 (×5): qty 2

## 2020-10-11 MED ORDER — HYDROMORPHONE HCL 2 MG/ML IJ SOLN
2.0000 mg | Freq: Once | INTRAMUSCULAR | Status: AC
  Administered 2020-10-11: 2 mg via INTRAVENOUS
  Filled 2020-10-11: qty 1

## 2020-10-11 MED ORDER — TIOTROPIUM BROMIDE MONOHYDRATE 2.5 MCG/ACT IN AERS: 2.0000 | INHALATION_SPRAY | Freq: Every day | RESPIRATORY_TRACT | Status: DC

## 2020-10-11 MED ORDER — ONDANSETRON HCL 4 MG/2ML IJ SOLN
4.0000 mg | Freq: Once | INTRAMUSCULAR | Status: AC
  Administered 2020-10-11: 4 mg via INTRAVENOUS
  Filled 2020-10-11: qty 2

## 2020-10-11 MED ORDER — DULOXETINE HCL 60 MG PO CPEP
90.0000 mg | ORAL_CAPSULE | Freq: Every day | ORAL | Status: DC
  Administered 2020-10-11 – 2020-10-14 (×4): 90 mg via ORAL
  Filled 2020-10-11: qty 3
  Filled 2020-10-11 (×3): qty 1

## 2020-10-11 MED ORDER — SODIUM BICARBONATE 8.4 % IV SOLN
50.0000 meq | Freq: Once | INTRAVENOUS | Status: AC
  Administered 2020-10-11: 50 meq via INTRAVENOUS

## 2020-10-11 MED ORDER — MAGNESIUM SULFATE 2 GM/50ML IV SOLN
2.0000 g | Freq: Once | INTRAVENOUS | Status: AC
  Administered 2020-10-11: 2 g via INTRAVENOUS
  Filled 2020-10-11: qty 50

## 2020-10-11 MED ORDER — LACTATED RINGERS IV BOLUS: 20.0000 mL/kg | Freq: Once | INTRAVENOUS | Status: DC

## 2020-10-11 MED ORDER — OXYCODONE HCL ER 15 MG PO T12A
15.0000 mg | EXTENDED_RELEASE_TABLET | Freq: Two times a day (BID) | ORAL | Status: DC
  Administered 2020-10-11 – 2020-10-14 (×7): 15 mg via ORAL
  Filled 2020-10-11 (×6): qty 1

## 2020-10-11 MED ORDER — GLUCOSE 40 % PO GEL
1.0000 | ORAL | Status: DC
Start: 2020-10-11 — End: 2020-10-11
  Administered 2020-10-11: 31 g via ORAL
  Filled 2020-10-11: qty 1

## 2020-10-11 MED ORDER — DICLOFENAC SODIUM 1 % TD GEL
2.0000 g | Freq: Every day | TRANSDERMAL | Status: DC | PRN
  Filled 2020-10-11: qty 100

## 2020-10-11 MED ORDER — TIZANIDINE HCL 4 MG PO TABS
6.0000 mg | ORAL_TABLET | Freq: Three times a day (TID) | ORAL | Status: DC
  Administered 2020-10-11 – 2020-10-14 (×10): 6 mg via ORAL
  Filled 2020-10-11 (×5): qty 3
  Filled 2020-10-11 (×2): qty 2
  Filled 2020-10-11 (×3): qty 3

## 2020-10-11 MED ORDER — HYDROMORPHONE HCL 1 MG/ML IJ SOLN: 0.5000 mg | INTRAMUSCULAR | Status: DC | PRN

## 2020-10-11 MED ORDER — TRAZODONE HCL 50 MG PO TABS
150.0000 mg | ORAL_TABLET | Freq: Every evening | ORAL | Status: DC | PRN
  Administered 2020-10-11 – 2020-10-13 (×3): 150 mg via ORAL
  Filled 2020-10-11 (×3): qty 3

## 2020-10-11 MED ORDER — ENOXAPARIN SODIUM 30 MG/0.3ML IJ SOSY
30.0000 mg | PREFILLED_SYRINGE | INTRAMUSCULAR | Status: DC
  Administered 2020-10-11 – 2020-10-13 (×3): 30 mg via SUBCUTANEOUS
  Filled 2020-10-11 (×3): qty 0.3

## 2020-10-11 MED ORDER — IPRATROPIUM-ALBUTEROL 0.5-2.5 (3) MG/3ML IN SOLN: 3.0000 mL | Freq: Four times a day (QID) | RESPIRATORY_TRACT | Status: DC | PRN

## 2020-10-11 MED ORDER — HYDROMORPHONE HCL 1 MG/ML IJ SOLN
0.5000 mg | INTRAMUSCULAR | Status: DC | PRN
  Administered 2020-10-11 – 2020-10-14 (×17): 0.5 mg via INTRAVENOUS
  Filled 2020-10-11 (×17): qty 0.5

## 2020-10-11 MED ORDER — HYDROMORPHONE HCL 1 MG/ML IJ SOLN
0.5000 mg | INTRAMUSCULAR | Status: DC | PRN
Start: 2020-10-11 — End: 2020-10-11
  Administered 2020-10-11: 0.5 mg via INTRAVENOUS
  Filled 2020-10-11: qty 1

## 2020-10-11 MED ORDER — GLUCOSE 40 % PO GEL: 1.0000 | Freq: Once | ORAL | Status: DC | PRN

## 2020-10-11 MED ORDER — UMECLIDINIUM BROMIDE 62.5 MCG/INH IN AEPB
1.0000 | INHALATION_SPRAY | Freq: Every day | RESPIRATORY_TRACT | Status: DC
  Administered 2020-10-11 – 2020-10-14 (×4): 1 via RESPIRATORY_TRACT
  Filled 2020-10-11 (×2): qty 7

## 2020-10-11 MED ORDER — GLUCOSE 40 % PO GEL
1.0000 | Freq: Once | ORAL | Status: AC
  Administered 2020-10-11: 31 g via ORAL
  Filled 2020-10-11: qty 1

## 2020-10-11 MED ORDER — MOMETASONE FURO-FORMOTEROL FUM 200-5 MCG/ACT IN AERO
2.0000 | INHALATION_SPRAY | Freq: Two times a day (BID) | RESPIRATORY_TRACT | Status: DC
  Administered 2020-10-11 – 2020-10-14 (×6): 2 via RESPIRATORY_TRACT
  Filled 2020-10-11 (×2): qty 8.8

## 2020-10-11 MED ORDER — KCL IN DEXTROSE-NACL 20-5-0.9 MEQ/L-%-% IV SOLN
INTRAVENOUS | Status: DC
  Filled 2020-10-11 (×3): qty 1000

## 2020-10-11 MED ORDER — INSULIN ASPART 100 UNIT/ML IJ SOLN
0.0000 [IU] | Freq: Three times a day (TID) | INTRAMUSCULAR | Status: DC
  Administered 2020-10-12 – 2020-10-13 (×4): 1 [IU] via SUBCUTANEOUS
  Administered 2020-10-13: 2 [IU] via SUBCUTANEOUS
  Administered 2020-10-14: 1 [IU] via SUBCUTANEOUS

## 2020-10-11 MED ORDER — POTASSIUM CHLORIDE 10 MEQ/100ML IV SOLN
10.0000 meq | INTRAVENOUS | Status: DC
  Administered 2020-10-11: 10 meq via INTRAVENOUS
  Filled 2020-10-11: qty 100

## 2020-10-11 MED ORDER — INSULIN (MYXREDLIN) INFUSION FOR HYPERTRIGLYCERIDEMIA
0.0113 [IU]/kg/h | INTRAVENOUS | Status: DC
  Administered 2020-10-11: 0.0113 [IU]/kg/h via INTRAVENOUS
  Filled 2020-10-11: qty 100

## 2020-10-11 MED ORDER — ALBUTEROL SULFATE HFA 108 (90 BASE) MCG/ACT IN AERS: 2.0000 | INHALATION_SPRAY | Freq: Four times a day (QID) | RESPIRATORY_TRACT | Status: DC | PRN

## 2020-10-11 MED ORDER — PANTOPRAZOLE SODIUM 40 MG PO TBEC
40.0000 mg | DELAYED_RELEASE_TABLET | Freq: Every day | ORAL | Status: DC
  Administered 2020-10-11 – 2020-10-12 (×2): 40 mg via ORAL
  Filled 2020-10-11 (×4): qty 1

## 2020-10-11 MED ORDER — SODIUM CHLORIDE 0.9 % IV BOLUS
880.0000 mL | Freq: Once | INTRAVENOUS | Status: AC
  Administered 2020-10-11: 880 mL via INTRAVENOUS

## 2020-10-11 MED ORDER — HYDROMORPHONE HCL 1 MG/ML IJ SOLN: 1.0000 mg | INTRAMUSCULAR | Status: DC | PRN

## 2020-10-11 MED ORDER — DEXTROSE 50 % IV SOLN
12.5000 g | Freq: Once | INTRAVENOUS | Status: AC
  Administered 2020-10-11: 12.5 g via INTRAVENOUS
  Filled 2020-10-11: qty 50

## 2020-10-11 MED ORDER — PANTOPRAZOLE SODIUM 40 MG IV SOLR
40.0000 mg | Freq: Once | INTRAVENOUS | Status: AC
  Administered 2020-10-11: 40 mg via INTRAVENOUS
  Filled 2020-10-11: qty 40

## 2020-10-11 NOTE — Care Management Obs Status (Signed)
[place] NOTIFICATION   Patient Details  Name: Jasmine [name] MRN: [id number] Date of Birth: 1955/05/07   Medicare Observation Status Notification Given:  Yes (RN, Estill Bamberg agreed to deliver letter due to precautions)    [name] 10/11/2020, 4:36 PM

## 2020-10-11 NOTE — Plan of Care (Signed)
Patient educated regarding enteric isolation precautions. Still not able to collect stool sample- two stool episodes while urinating. Urine was clear with very minimal amount of sediment from potential stool, no large amount of diarrhea present. Patient educated on the use of dilaudid- it is not scheduled and she will have to call/inform us when she needs breakthrough pain medication. No questions about plan of care at this time.

## 2020-10-11 NOTE — TOC Progression Note (Signed)
Received TOC consult from RN stating pt would benefit from PT eval for outpatient PT. Reviewed chart and MD ordered a PT evaluation at 7:30 this AM. Eval is still pending. TOC will follow up once evaluation is complete and recommendations are made.

## 2020-10-11 NOTE — ED Notes (Signed)
Patient's bedside commode emptied at this time.

## 2020-10-11 NOTE — ED Notes (Signed)
POC blood glucose. Dr. Olevia Bowens made aware. No new orders. Pt medicated per MAR.

## 2020-10-11 NOTE — ED Notes (Signed)
Patient going to floor 2 room 02

## 2020-10-11 NOTE — Progress Notes (Signed)
Inpatient Diabetes Program Recommendations  AACE/ADA: New Consensus Statement on Inpatient Glycemic Control (2015)  Target Ranges:  Prepandial:   less than 140 mg/dL      Peak postprandial:   less than 180 mg/dL (1-2 hours)      Critically ill patients:  140 - 180 mg/dL   Lab Results  Component Value Date   GLUCAP 190 (H) 10/11/2020   HGBA1C 5.1 10/11/2020    Review of Glycemic Control  Diabetes history: none  Current orders for Inpatient glycemic control:  None  A1c 5.1% on 8/8  Inpatient Diabetes Program Recommendations:    Hyperglycemia 185/190. Stress response. Hx pancreatitis in the past.  -  consider Novolog 0-9 units tid  Thanks, Tama Headings RN, MSN, BC-ADM Inpatient Diabetes Coordinator Team Pager [phone] (8a-5p)

## 2020-10-11 NOTE — ED Notes (Signed)
Verbal order from Dr. Olevia Bowens to change LR bolus to normal saline bolus at same dose and rate.  Pt medicated per MAR. Ice water provided. VS updated and stable. Pt denies further needs at this time. Side rails up x 2, bed locked and low, call bell within reach. Will continue to monitor.

## 2020-10-11 NOTE — Progress Notes (Signed)
PROGRESS NOTE   Jasmine [name]  YTK:[id number] DOB: [birth year] DOA: 10/10/2020 PCP: Janora Norlander, DO   No chief complaint on file.  Level of care: Telemetry  Brief Admission History:  65 y.o. female with medical history significant of seasonal allergies, normocytic anemia, history of blood transfusion, anxiety, osteoarthritis, COPD, active light smoker, chronic back pain, easy bruising, GERD, headache, nephrolithiasis, hypertension, hyperglycemia, neuropathy, normocytic anemia, history of pancreatitis, right lung cancer, sciatica, seizures as a child who is coming to the emergency department with complaints of numerous episodes of nausea/emesis and diarrhea associated with subjective fever at home since Thursday.  Denies any constipation, melena or hematochezia.  No dysuria, flank pain, frequency or hematuria.  She denied travel history or sick contacts.  She had a COVID test at home that was negative.  No rhinorrhea, sore throat, hemoptysis, chest pain, palpitations, diaphoresis, PND, orthopnea or pitting edema lower extremities.  She has felt lightheaded on occasion.  No polyuria, polydipsia, polyphagia or blurred vision.  Assessment & Plan:   Principal Problem:   Acute gastroenteritis Active Problems:   HTN (hypertension)   COPD GOLD II    Aortic atherosclerosis (HCC)   Chronic respiratory failure with hypoxia (HCC)   GERD (gastroesophageal reflux disease)   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HCC)  Acute gastroenteritis - Pt reports no known sick contacts.  She is being admitted for IV fluids, IV nausea medication, electrolyte replacement and other supportive measures.  GI pathogen panel ordered.  Check portable abdomen xray in AM.    Ketoacidosis - likely from severe dehydration, treating with IV fluids and supportive measures.    Essential hypertension - resume  home medications.   Chronic respiratory failure with hypoxia - secondary to COPD GOLD II - she is resumed on home  bronchodilators symbicorts and supplemental oxygen.   GERD - protonix ordered for GI protection.    Chronic pain and opioid dependence - Pt is on high dose opioid therapy at home.  We have resumed her home pain medication for basal pain control with IV ordered for severe breakthrough symptoms.    DVT prophylaxis: enoxaparin  Code Status: Full  Family Communication: none present  Disposition: anticipate home when medically stable.  Status is: Observation  The patient will require care spanning > 2 midnights and should be moved to inpatient because: IV treatments appropriate due to intensity of illness or inability to take PO and Inpatient level of care appropriate due to severity of illness  Dispo: The patient is from: Home              Anticipated d/c is to: Home              Patient currently is not medically stable to d/c.   Difficult to place patient No       Consultants:  N/a   Procedures:  N/a   Antimicrobials:  N/a    Subjective: Pt reports that she continues to have loose stool, nausea and vomiting and pain is uncontrolled.   Objective: Vitals:   10/11/20 0800 10/11/20 1000 10/11/20 1132 10/11/20 1135  BP: (!) 160/89 132/81  110/83  Pulse: 81   77  Resp: 12 14  20   Temp:    98.3 F (36.8 C)  TempSrc:    Oral  SpO2: 99%  98% 100%  Weight:      Height:        Intake/Output Summary (Last 24 hours) at 10/11/2020 1449 Last data filed at 10/11/2020 0941 Gross per 24  hour  Intake 2151.92 ml  Output --  Net 2151.92 ml   Filed Weights   10/10/20 2041  Weight: 44 kg    Examination:  General exam: emaciated chronically ill appearing female, Appears calm and comfortable  Respiratory system: Clear to auscultation. Respiratory effort normal. Cardiovascular system: normal S1 & S2 heard. No JVD, murmurs, rubs, gallops or clicks. No pedal edema. Gastrointestinal system: Abdomen is nondistended, soft and nontender. No organomegaly or masses felt. Normal bowel sounds  heard. Central nervous system: Alert and oriented. No focal neurological deficits. Extremities: Symmetric 5 x 5 power. Skin: No rashes, lesions or ulcers Psychiatry: Judgement and insight appear normal. Mood & affect appropriate.   Data Reviewed: I have personally reviewed following labs and imaging studies  CBC: Recent Labs  Lab 10/10/20 2104  WBC 8.5  HGB 12.0  HCT 39.9  MCV 105.3*  PLT 465    Basic Metabolic Panel: Recent Labs  Lab 10/10/20 2104 10/11/20 0330 10/11/20 0453 10/11/20 1055  NA 133* 135  --  133*  K 3.6 3.6  --  2.9*  CL 94* 101  --  100  CO2 17* 20*  --  24  GLUCOSE 119* 109*  --  234*  BUN 11 9  --  5*  CREATININE 0.68 0.57  --  0.41*  CALCIUM 9.1 8.2*  --  8.2*  MG  --   --  1.8  --     GFR: Estimated Creatinine Clearance: 47.4 mL/min (A) (by C-G formula based on SCr of 0.41 mg/dL (L)).  Liver Function Tests: Recent Labs  Lab 10/10/20 2104  AST 23  ALT 22  ALKPHOS 126  BILITOT 1.3*  PROT 8.0  ALBUMIN 3.9    CBG: Recent Labs  Lab 10/11/20 0543 10/11/20 0806 10/11/20 1414  GLUCAP 185* 190* 164*    Recent Results (from the past 240 hour(s))  Resp Panel by RT-PCR (Flu A&B, Covid) Nasopharyngeal Swab     Status: None   Collection Time: 10/10/20  9:38 PM   Specimen: Nasopharyngeal Swab; Nasopharyngeal(NP) swabs in vial transport medium  Result Value Ref Range Status   SARS Coronavirus 2 by RT PCR NEGATIVE NEGATIVE Final    Comment: (NOTE) SARS-CoV-2 target nucleic acids are NOT DETECTED.  The SARS-CoV-2 RNA is generally detectable in upper respiratory specimens during the acute phase of infection. The lowest concentration of SARS-CoV-2 viral copies this assay can detect is 138 copies/mL. A negative result does not preclude SARS-Cov-2 infection and should not be used as the sole basis for treatment or other patient management decisions. A negative result may occur with  improper specimen collection/handling, submission of  specimen other than nasopharyngeal swab, presence of viral mutation(s) within the areas targeted by this assay, and inadequate number of viral copies(<138 copies/mL). A negative result must be combined with clinical observations, patient history, and epidemiological information. The expected result is Negative.  Fact Sheet for Patients:  EntrepreneurPulse.com.au  Fact Sheet for Healthcare Providers:  IncredibleEmployment.be  This test is no t yet approved or cleared by the [place] FDA and  has been authorized for detection and/or diagnosis of SARS-CoV-2 by FDA under an Emergency Use Authorization (EUA). This EUA will remain  in effect (meaning this test can be used) for the duration of the COVID-19 declaration under Section 564(b)(1) of the Act, 21 U.S.C.section 360bbb-3(b)(1), unless the authorization is terminated  or revoked sooner.       Influenza A by PCR NEGATIVE NEGATIVE Final   Influenza  B by PCR NEGATIVE NEGATIVE Final    Comment: (NOTE) The Xpert Xpress SARS-CoV-2/FLU/RSV plus assay is intended as an aid in the diagnosis of influenza from Nasopharyngeal swab specimens and should not be used as a sole basis for treatment. Nasal washings and aspirates are unacceptable for Xpert Xpress SARS-CoV-2/FLU/RSV testing.  Fact Sheet for Patients: EntrepreneurPulse.com.au  Fact Sheet for Healthcare Providers: IncredibleEmployment.be  This test is not yet approved or cleared by the [place] FDA and has been authorized for detection and/or diagnosis of SARS-CoV-2 by FDA under an Emergency Use Authorization (EUA). This EUA will remain in effect (meaning this test can be used) for the duration of the COVID-19 declaration under Section 564(b)(1) of the Act, 21 U.S.C. section 360bbb-3(b)(1), unless the authorization is terminated or revoked.  Performed at [facility], [address],  [place],  [id number]      Radiology Studies: DG ABD ACUTE 2+V W 1V CHEST  Result Date: 10/10/2020 CLINICAL DATA:  Nausea and vomiting with shortness of breath, initial encounter EXAM: DG ABDOMEN ACUTE WITH 1 VIEW CHEST COMPARISON:  10/05/2020, 08/06/2020 FINDINGS: Cardiac shadow is stable. Spinal stimulator is again noted. Postsurgical changes are noted in the right lung with mild volume loss. No focal infiltrate or effusion is seen. Mild chronic interstitial changes are seen. Scattered large and small bowel gas is noted. No free air is noted. Postsurgical changes in the lower lumbosacral spine are seen. No acute fracture is noted. No soft tissue abnormality is seen. IMPRESSION: Chronic changes in the chest and abdomen without acute abnormality. Electronically Signed   By: Inez Catalina M.D.   On: 10/10/2020 21:36    Scheduled Meds:  amLODipine  10 mg Oral Daily   atorvastatin  40 mg Oral QPM   DULoxetine  90 mg Oral Daily   enoxaparin (LOVENOX) injection  30 mg Subcutaneous Q24H   feeding supplement  1 Container Oral TID BM   insulin aspart  0-9 Units Subcutaneous TID WC   mometasone-formoterol  2 puff Inhalation BID   oxyCODONE  15 mg Oral Q12H   pantoprazole  40 mg Oral Daily   tiZANidine  6 mg Oral TID   umeclidinium bromide  1 puff Inhalation Daily   Continuous Infusions:  0.9 % NaCl with KCl 20 mEq / L 100 mL/hr at 10/11/20 1210    LOS: 0 days   Time spent: 38 minutes   Esperansa Sarabia Wynetta Emery, MD How to contact the [facility] Attending or Consulting provider [place] or covering provider during after hours [place], for this patient?  Check the care team in [facility] and look for a) attending/consulting [facility] provider listed and b) the [facility] team listed Log into [email or web address] and use [place]'s universal password to access. If you do not have the password, please contact the hospital operator. Locate the [facility] provider you are looking for under [facility] and page to a number that you can be  directly reached. If you still have difficulty reaching the provider, please page the [facility] (Director on Call) for the Hospitalists listed on amion for assistance.  10/11/2020, 2:49 PM

## 2020-10-12 ENCOUNTER — Encounter (HOSPITAL_COMMUNITY): Payer: Self-pay

## 2020-10-12 ENCOUNTER — Encounter (HOSPITAL_COMMUNITY): Payer: Medicare Other

## 2020-10-12 ENCOUNTER — Ambulatory Visit (HOSPITAL_COMMUNITY): Payer: Medicare Other | Admitting: Hematology

## 2020-10-12 ENCOUNTER — Inpatient Hospital Stay (HOSPITAL_COMMUNITY): Payer: Medicare Other

## 2020-10-12 DIAGNOSIS — E876 Hypokalemia: Secondary | ICD-10-CM

## 2020-10-12 LAB — CBC WITH DIFFERENTIAL/PLATELET
Abs Immature Granulocytes: 0.02 10*3/uL (ref 0.00–0.07)
Basophils Absolute: 0 10*3/uL (ref 0.0–0.1)
Basophils Relative: 1 %
Eosinophils Absolute: 0.1 10*3/uL (ref 0.0–0.5)
Eosinophils Relative: 2 %
HCT: 38.9 % (ref 36.0–46.0)
Hemoglobin: 11.5 g/dL — ABNORMAL LOW (ref 12.0–15.0)
Immature Granulocytes: 0 %
Lymphocytes Relative: 27 %
Lymphs Abs: 1.6 10*3/uL (ref 0.7–4.0)
MCH: 31.3 pg (ref 26.0–34.0)
MCHC: 29.6 g/dL — ABNORMAL LOW (ref 30.0–36.0)
MCV: 106 fL — ABNORMAL HIGH (ref 80.0–100.0)
Monocytes Absolute: 1 10*3/uL (ref 0.1–1.0)
Monocytes Relative: 17 %
Neutro Abs: 3.1 10*3/uL (ref 1.7–7.7)
Neutrophils Relative %: 53 %
Platelets: 265 10*3/uL (ref 150–400)
RBC: 3.67 MIL/uL — ABNORMAL LOW (ref 3.87–5.11)
RDW: 12.8 % (ref 11.5–15.5)
WBC: 6 10*3/uL (ref 4.0–10.5)
nRBC: 0 % (ref 0.0–0.2)

## 2020-10-12 LAB — COMPREHENSIVE METABOLIC PANEL
ALT: 19 U/L (ref 0–44)
AST: 25 U/L (ref 15–41)
Albumin: 3.3 g/dL — ABNORMAL LOW (ref 3.5–5.0)
Alkaline Phosphatase: 96 U/L (ref 38–126)
Anion gap: 9 (ref 5–15)
BUN: <5 mg/dL — ABNORMAL LOW (ref 8–23)
CO2: 28 mmol/L (ref 22–32)
Calcium: 9.2 mg/dL (ref 8.9–10.3)
Chloride: 103 mmol/L (ref 98–111)
Creatinine, Ser: 0.38 mg/dL — ABNORMAL LOW (ref 0.44–1.00)
GFR, Estimated: >60 mL/min (ref >60)
Glucose, Bld: 111 mg/dL — ABNORMAL HIGH (ref 70–99)
Potassium: 2.8 mmol/L — ABNORMAL LOW (ref 3.5–5.1)
Sodium: 140 mmol/L (ref 135–145)
Total Bilirubin: 0.7 mg/dL (ref 0.3–1.2)
Total Protein: 6.4 g/dL — ABNORMAL LOW (ref 6.5–8.1)

## 2020-10-12 LAB — GLUCOSE, CAPILLARY
Glucose-Capillary: 115 mg/dL — ABNORMAL HIGH (ref 70–99)
Glucose-Capillary: 132 mg/dL — ABNORMAL HIGH (ref 70–99)
Glucose-Capillary: 148 mg/dL — ABNORMAL HIGH (ref 70–99)
Glucose-Capillary: 126 mg/dL — ABNORMAL HIGH (ref 70–99)
Glucose-Capillary: 101 mg/dL — ABNORMAL HIGH (ref 70–99)

## 2020-10-12 LAB — MAGNESIUM: Magnesium: 1.7 mg/dL (ref 1.7–2.4)

## 2020-10-12 MED ORDER — POTASSIUM CHLORIDE 10 MEQ/100ML IV SOLN
10.0000 meq | INTRAVENOUS | Status: AC
  Administered 2020-10-12 (×4): 10 meq via INTRAVENOUS
  Filled 2020-10-12 (×4): qty 100

## 2020-10-12 MED ORDER — PROSOURCE PLUS PO LIQD
30.0000 mL | Freq: Two times a day (BID) | ORAL | Status: DC
  Administered 2020-10-13 – 2020-10-14 (×3): 30 mL via ORAL
  Filled 2020-10-12 (×3): qty 30

## 2020-10-12 NOTE — Progress Notes (Signed)
PT Cancellation Note  Patient Details Name: Jasmine [name] MRN: [id number] DOB: 1956-01-01   Cancelled Treatment:    Reason Eval/Treat Not Completed: Patient declined, no reason specified; Patient stated she did not want to participate with physical therapy at the moment. Will check back later.   11:10 AM, 10/12/20 Mearl Latin PT, DPT Physical Therapist at [facility]

## 2020-10-12 NOTE — Progress Notes (Signed)
Initial Nutrition Assessment  DOCUMENTATION CODES:   Underweight  INTERVENTION:  Boost Breeze po TID, each supplement provides 250 kcal and 9 grams of protein   ProSource 30 ml TID (each 30 ml provides 100 kcal, 15 gr protein)   NUTRITION DIAGNOSIS:   Inadequate oral intake related to acute illness (gastroenteritis) as evidenced by  (Clear liquid diet and po intake 25-50%).   GOAL:   Pt to meet >/= 90% of their estimated nutrition needs     MONITOR:  Diet advancement, Supplement acceptance, PO intake, Weight trends, Skin, Labs  REASON FOR ASSESSMENT:   Malnutrition Screening Tool    ASSESSMENT: Patient is an underweight 65 yo female with history of GERD, COPD, Chronic back pain, pancreatitis, lung cancer and HTN. Presents with nausea, vomiting and diarrhea.   Patient taking clears. PO: 25-50%. No vomiting today. Patient says intake better today. Last "normal" meal Wednesday. At home she has been taking mostly juices, tea and other liquids. She is drinking the [employer] and likes those.   Medications reviewed. Weights have ranged between 43-44 kg the past 3 months. Likely malnutrition given her poor intake the past 6 days and minimal fat/muscle reserves.   Labs: BMP Latest Ref Rng & Units 10/12/2020 10/11/2020 10/11/2020  Glucose 70 - 99 mg/dL 111(H) 234(H) 109(H)  BUN 8 - 23 mg/dL <5(L) 5(L) 9  Creatinine 0.44 - 1.00 mg/dL 0.38(L) 0.41(L) 0.57  BUN/Creat Ratio 12 - 28 - - -  Sodium 135 - 145 mmol/L 140 133(L) 135  Potassium 3.5 - 5.1 mmol/L 2.8(L) 2.9(L) 3.6  Chloride 98 - 111 mmol/L 103 100 101  CO2 22 - 32 mmol/L 28 24 20(L)  Calcium 8.9 - 10.3 mg/dL 9.2 8.2(L) 8.2(L)      NUTRITION - FOCUSED PHYSICAL EXAM:  Unable to complete Nutrition-Focused physical exam at this time.  Nursing working with patient during RD visit.   Diet Order:   Diet Order             Diet clear liquid Room service appropriate? Yes; Fluid consistency: Thin  Diet effective now                    EDUCATION NEEDS:  Not appropriate for education at this time  Skin:  Skin Assessment: Reviewed RN Assessment  Last BM:  8/8 per nursing assessment- diarrhea and loss of appetite  Height:   Ht Readings from Last 1 Encounters:  10/10/20 5\' 3"  (1.6 m)    Weight:   Wt Readings from Last 1 Encounters:  10/10/20 44 kg    Ideal Body Weight:   52 kg  BMI:  Body mass index is 17.18 kg/m.  Estimated Nutritional Needs:   Kcal:  4585-9292  Protein:  65-70 gr  Fluid:  >1300 ml daily  Colman Cater MS,RD,CSG,LDN Contact: Shea Evans

## 2020-10-12 NOTE — Progress Notes (Addendum)
PROGRESS NOTE   Jasmine [name]  DPO:[id number] DOB: 06/25/1955 DOA: 10/10/2020 PCP: Janora Norlander, DO   No chief complaint on file.  Level of care: Telemetry  Brief Admission History:  65 y.o. female with medical history significant of seasonal allergies, normocytic anemia, history of blood transfusion, anxiety, osteoarthritis, COPD, active light smoker, chronic back pain, easy bruising, GERD, headache, nephrolithiasis, hypertension, hyperglycemia, neuropathy, normocytic anemia, history of pancreatitis, right lung cancer, sciatica, seizures as a child who is coming to the emergency department with complaints of numerous episodes of nausea/emesis and diarrhea associated with subjective fever at home since Thursday.  Denies any constipation, melena or hematochezia.  No dysuria, flank pain, frequency or hematuria.  She denied travel history or sick contacts.  She had a COVID test at home that was negative.  No rhinorrhea, sore throat, hemoptysis, chest pain, palpitations, diaphoresis, PND, orthopnea or pitting edema lower extremities.  She has felt lightheaded on occasion.  No polyuria, polydipsia, polyphagia or blurred vision.  Assessment & Plan:   Principal Problem:   Acute gastroenteritis Active Problems:   HTN (hypertension)   COPD GOLD II    Aortic atherosclerosis (HCC)   Chronic respiratory failure with hypoxia (HCC)   GERD (gastroesophageal reflux disease)   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HCC)   Gastroenteritis  Acute gastroenteritis - Pt reports no known sick contacts.  She was admitted for IV fluids, IV nausea medication, electrolyte replacement and other supportive measures.  GI pathogen panel still pending. C diff testing has been negative.   portable abdomen xray no acute findings, no severe constipation (given high dose opioid use).  Up with PT requested.  Pt not ready to advance diet.  Continue clear liquid diet for now.   Ketoacidosis - likely from severe dehydration, treating with  IV fluids and supportive measures.    Essential hypertension - resumed home medications.   Chronic respiratory failure with hypoxia - secondary to COPD GOLD II - she is resumed on home bronchodilators symbicorts and supplemental oxygen.   Hypokalemia - IV replacement ordered, recheck in AM.    GERD - protonix ordered for GI protection.    Chronic pain and opioid dependence - Pt is on high dose opioid therapy at home.  We have resumed her home pain medication for basal pain control with IV ordered for severe breakthrough symptoms.    DVT prophylaxis: enoxaparin  Code Status: Full  Family Communication: none present  Disposition: anticipate home when medically stable possibly tomorrow  Status is: Inpatient   The patient will require care spanning > 2 midnights and should be moved to inpatient because: IV treatments appropriate due to intensity of illness or inability to take PO and Inpatient level of care appropriate due to severity of illness  Dispo: The patient is from: Home              Anticipated d/c is to: Home              Patient currently is not medically stable to d/c.   Difficult to place patient No   Consultants:  N/a   Procedures:  N/a   Antimicrobials:  N/a    Subjective: Pt says diarrhea is slowing down but still having malaise, nausea and stomach upset, neck and shoulder pain persists.    Objective: Vitals:   10/11/20 2017 10/12/20 0003 10/12/20 0500 10/12/20 0723  BP: (!) 141/85 (!) 154/97 (!) 171/97   Pulse: 81 91 98   Resp: 18 18 18    Temp:  98.5 F (36.9 C) 98.6 F (37 C) 98 F (36.7 C)   TempSrc: Oral Oral Oral   SpO2: 100% 100% 100% 100%  Weight:      Height:        Intake/Output Summary (Last 24 hours) at 10/12/2020 1125 Last data filed at 10/11/2020 1915 Gross per 24 hour  Intake 900.99 ml  Output 925 ml  Net -24.01 ml   Filed Weights   10/10/20 2041  Weight: 44 kg    Examination:  General exam: emaciated chronically ill appearing  female, Appears calm and comfortable  Respiratory system: Clear to auscultation. Respiratory effort normal. Cardiovascular system: normal S1 & S2 heard. No JVD, murmurs, rubs, gallops or clicks. No pedal edema. Gastrointestinal system: Abdomen is nondistended, soft and nontender. No organomegaly or masses felt. Normal bowel sounds heard. Central nervous system: Alert and oriented. No focal neurological deficits. Extremities: Symmetric 5 x 5 power. Skin: No rashes, lesions or ulcers Psychiatry: Judgement and insight appear normal. Mood & affect appropriate.   Data Reviewed: I have personally reviewed following labs and imaging studies  CBC: Recent Labs  Lab 10/10/20 2104 10/12/20 0255  WBC 8.5 6.0  NEUTROABS  --  3.1  HGB 12.0 11.5*  HCT 39.9 38.9  MCV 105.3* 106.0*  PLT 344 338    Basic Metabolic Panel: Recent Labs  Lab 10/10/20 2104 10/11/20 0330 10/11/20 0453 10/11/20 1055 10/12/20 0255  NA 133* 135  --  133* 140  K 3.6 3.6  --  2.9* 2.8*  CL 94* 101  --  100 103  CO2 17* 20*  --  24 28  GLUCOSE 119* 109*  --  234* 111*  BUN 11 9  --  5* <5*  CREATININE 0.68 0.57  --  0.41* 0.38*  CALCIUM 9.1 8.2*  --  8.2* 9.2  MG  --   --  1.8  --  1.7    GFR: Estimated Creatinine Clearance: 47.4 mL/min (A) (by C-G formula based on SCr of 0.38 mg/dL (L)).  Liver Function Tests: Recent Labs  Lab 10/10/20 2104 10/12/20 0255  AST 23 25  ALT 22 19  ALKPHOS 126 96  BILITOT 1.3* 0.7  PROT 8.0 6.4*  ALBUMIN 3.9 3.3*    CBG: Recent Labs  Lab 10/11/20 1414 10/11/20 1654 10/11/20 2102 10/12/20 0248 10/12/20 0731  GLUCAP 164* 120* 90 115* 132*    Recent Results (from the past 240 hour(s))  Resp Panel by RT-PCR (Flu A&B, Covid) Nasopharyngeal Swab     Status: None   Collection Time: 10/10/20  9:38 PM   Specimen: Nasopharyngeal Swab; Nasopharyngeal(NP) swabs in vial transport medium  Result Value Ref Range Status   SARS Coronavirus 2 by RT PCR NEGATIVE NEGATIVE Final     Comment: (NOTE) SARS-CoV-2 target nucleic acids are NOT DETECTED.  The SARS-CoV-2 RNA is generally detectable in upper respiratory specimens during the acute phase of infection. The lowest concentration of SARS-CoV-2 viral copies this assay can detect is 138 copies/mL. A negative result does not preclude SARS-Cov-2 infection and should not be used as the sole basis for treatment or other patient management decisions. A negative result may occur with  improper specimen collection/handling, submission of specimen other than nasopharyngeal swab, presence of viral mutation(s) within the areas targeted by this assay, and inadequate number of viral copies(<138 copies/mL). A negative result must be combined with clinical observations, patient history, and epidemiological information. The expected result is Negative.  Fact Sheet for Patients:  EntrepreneurPulse.com.au  Fact Sheet for Healthcare Providers:  IncredibleEmployment.be  This test is no t yet approved or cleared by the [place] FDA and  has been authorized for detection and/or diagnosis of SARS-CoV-2 by FDA under an Emergency Use Authorization (EUA). This EUA will remain  in effect (meaning this test can be used) for the duration of the COVID-19 declaration under Section 564(b)(1) of the Act, 21 U.S.C.section 360bbb-3(b)(1), unless the authorization is terminated  or revoked sooner.       Influenza A by PCR NEGATIVE NEGATIVE Final   Influenza B by PCR NEGATIVE NEGATIVE Final    Comment: (NOTE) The Xpert Xpress SARS-CoV-2/FLU/RSV plus assay is intended as an aid in the diagnosis of influenza from Nasopharyngeal swab specimens and should not be used as a sole basis for treatment. Nasal washings and aspirates are unacceptable for Xpert Xpress SARS-CoV-2/FLU/RSV testing.  Fact Sheet for Patients: EntrepreneurPulse.com.au  Fact Sheet for Healthcare  Providers: IncredibleEmployment.be  This test is not yet approved or cleared by the [place] FDA and has been authorized for detection and/or diagnosis of SARS-CoV-2 by FDA under an Emergency Use Authorization (EUA). This EUA will remain in effect (meaning this test can be used) for the duration of the COVID-19 declaration under Section 564(b)(1) of the Act, 21 U.S.C. section 360bbb-3(b)(1), unless the authorization is terminated or revoked.  Performed at [facility], [address], [place], [place] [address]   C Difficile Quick Screen w PCR reflex     Status: None   Collection Time: 10/11/20  5:16 PM   Specimen: Stool  Result Value Ref Range Status   C Diff antigen NEGATIVE NEGATIVE Final   C Diff toxin NEGATIVE NEGATIVE Final   C Diff interpretation No C. difficile detected.  Final    Comment: Performed at [facility], [address], [place],  [address]     Radiology Studies: DG ABD ACUTE 2+V W 1V CHEST  Result Date: 10/10/2020 CLINICAL DATA:  Nausea and vomiting with shortness of breath, initial encounter EXAM: DG ABDOMEN ACUTE WITH 1 VIEW CHEST COMPARISON:  10/05/2020, 08/06/2020 FINDINGS: Cardiac shadow is stable. Spinal stimulator is again noted. Postsurgical changes are noted in the right lung with mild volume loss. No focal infiltrate or effusion is seen. Mild chronic interstitial changes are seen. Scattered large and small bowel gas is noted. No free air is noted. Postsurgical changes in the lower lumbosacral spine are seen. No acute fracture is noted. No soft tissue abnormality is seen. IMPRESSION: Chronic changes in the chest and abdomen without acute abnormality. Electronically Signed   By: Inez Catalina M.D.   On: 10/10/2020 21:36   DG Abd Portable 1V  Result Date: 10/12/2020 CLINICAL DATA:  Gastroenteritis.  Chronic back pain. EXAM: PORTABLE ABDOMEN - 1 VIEW COMPARISON:  10/10/2020 FINDINGS: Stable appearance of spinal stimulator. Stable  postoperative findings in the lower lumbar spine. Stable appearance of accentuated density along the right hemidiaphragm, chronic right pleural effusion is a possibility. Gas is present in the colon. No overt dilated bowel. Vascular calcifications noted. IMPRESSION: 1. Stable accentuated density along the right hemidiaphragm likely postoperative or due to chronic pleural fluid. 2. No acute intra-abdominal findings.  No dilated bowel identified. 3. Postoperative findings in the spine. No change in the appearance of the visualized part of the spinal stimulator apparatus. Electronically Signed   By: Van Clines M.D.   On: 10/12/2020 05:39    Scheduled Meds:  amLODipine  10 mg Oral Daily   atorvastatin  40  mg Oral QPM   DULoxetine  90 mg Oral Daily   enoxaparin (LOVENOX) injection  30 mg Subcutaneous Q24H   feeding supplement  1 Container Oral TID BM   insulin aspart  0-9 Units Subcutaneous TID WC   mometasone-formoterol  2 puff Inhalation BID   oxyCODONE  15 mg Oral Q12H   pantoprazole  40 mg Oral Daily   tiZANidine  6 mg Oral TID   umeclidinium bromide  1 puff Inhalation Daily   Continuous Infusions:  0.9 % NaCl with KCl 20 mEq / L 100 mL/hr at 10/12/20 0112   potassium chloride 10 mEq (10/12/20 1020)    LOS: 1 day   Time spent: 34 minutes   Domingos Riggi Wynetta Emery, MD How to contact the [facility] Attending or Consulting provider [place] or covering provider during after hours [place], for this patient?  Check the care team in [facility] and look for a) attending/consulting [facility] provider listed and b) the [facility] team listed Log into [email or web address] and use [place]'s universal password to access. If you do not have the password, please contact the hospital operator. Locate the [facility] provider you are looking for under [facility] and page to a number that you can be directly reached. If you still have difficulty reaching the provider, please page the [facility] (Director on Call) for the Hospitalists listed on  amion for assistance.  10/12/2020, 11:25 AM

## 2020-10-13 LAB — CBC WITH DIFFERENTIAL/PLATELET
Abs Immature Granulocytes: 0.02 10*3/uL (ref 0.00–0.07)
Basophils Absolute: 0 10*3/uL (ref 0.0–0.1)
Basophils Relative: 1 %
Eosinophils Absolute: 0.1 10*3/uL (ref 0.0–0.5)
Eosinophils Relative: 3 %
HCT: 36.1 % (ref 36.0–46.0)
Hemoglobin: 10.9 g/dL — ABNORMAL LOW (ref 12.0–15.0)
Immature Granulocytes: 1 %
Lymphocytes Relative: 30 %
Lymphs Abs: 1.3 10*3/uL (ref 0.7–4.0)
MCH: 31.6 pg (ref 26.0–34.0)
MCHC: 30.2 g/dL (ref 30.0–36.0)
MCV: 104.6 fL — ABNORMAL HIGH (ref 80.0–100.0)
Monocytes Absolute: 0.8 10*3/uL (ref 0.1–1.0)
Monocytes Relative: 18 %
Neutro Abs: 2.1 10*3/uL (ref 1.7–7.7)
Neutrophils Relative %: 47 %
Platelets: 215 10*3/uL (ref 150–400)
RBC: 3.45 MIL/uL — ABNORMAL LOW (ref 3.87–5.11)
RDW: 12.7 % (ref 11.5–15.5)
WBC: 4.4 10*3/uL (ref 4.0–10.5)
nRBC: 0 % (ref 0.0–0.2)

## 2020-10-13 LAB — GLUCOSE, CAPILLARY
Glucose-Capillary: 108 mg/dL — ABNORMAL HIGH (ref 70–99)
Glucose-Capillary: 132 mg/dL — ABNORMAL HIGH (ref 70–99)
Glucose-Capillary: 176 mg/dL — ABNORMAL HIGH (ref 70–99)
Glucose-Capillary: 91 mg/dL (ref 70–99)
Glucose-Capillary: 155 mg/dL — ABNORMAL HIGH (ref 70–99)

## 2020-10-13 LAB — COMPREHENSIVE METABOLIC PANEL
ALT: 18 U/L (ref 0–44)
AST: 19 U/L (ref 15–41)
Albumin: 3.1 g/dL — ABNORMAL LOW (ref 3.5–5.0)
Alkaline Phosphatase: 91 U/L (ref 38–126)
Anion gap: 6 (ref 5–15)
BUN: <5 mg/dL — ABNORMAL LOW (ref 8–23)
CO2: 27 mmol/L (ref 22–32)
Calcium: 9.1 mg/dL (ref 8.9–10.3)
Chloride: 105 mmol/L (ref 98–111)
Creatinine, Ser: 0.36 mg/dL — ABNORMAL LOW (ref 0.44–1.00)
GFR, Estimated: >60 mL/min (ref >60)
Glucose, Bld: 121 mg/dL — ABNORMAL HIGH (ref 70–99)
Potassium: 2.9 mmol/L — ABNORMAL LOW (ref 3.5–5.1)
Sodium: 138 mmol/L (ref 135–145)
Total Bilirubin: 0.5 mg/dL (ref 0.3–1.2)
Total Protein: 6 g/dL — ABNORMAL LOW (ref 6.5–8.1)

## 2020-10-13 LAB — MAGNESIUM: Magnesium: 1.4 mg/dL — ABNORMAL LOW (ref 1.7–2.4)

## 2020-10-13 MED ORDER — POTASSIUM CHLORIDE CRYS ER 20 MEQ PO TBCR
40.0000 meq | EXTENDED_RELEASE_TABLET | Freq: Once | ORAL | Status: AC
  Administered 2020-10-13: 40 meq via ORAL
  Filled 2020-10-13: qty 2

## 2020-10-13 MED ORDER — PANTOPRAZOLE SODIUM 40 MG IV SOLR
40.0000 mg | Freq: Two times a day (BID) | INTRAVENOUS | Status: DC
  Administered 2020-10-13 (×2): 40 mg via INTRAVENOUS
  Filled 2020-10-13 (×2): qty 40

## 2020-10-13 MED ORDER — MAGNESIUM SULFATE 2 GM/50ML IV SOLN
2.0000 g | Freq: Once | INTRAVENOUS | Status: AC
  Administered 2020-10-13: 2 g via INTRAVENOUS
  Filled 2020-10-13: qty 50

## 2020-10-13 MED ORDER — ONDANSETRON HCL 4 MG/2ML IJ SOLN
4.0000 mg | Freq: Four times a day (QID) | INTRAMUSCULAR | Status: DC
  Administered 2020-10-13 – 2020-10-14 (×3): 4 mg via INTRAVENOUS
  Filled 2020-10-13 (×3): qty 2

## 2020-10-13 NOTE — Progress Notes (Signed)
Patient respiration rate  post administration of dilaudid caused a drop in her RR which got as low as 8-9 while asleep. Patient instructed that RN was concerned giving other doses of dilaudid as she is on scheduled OxyContin/which possibly could of  been the cause. RN to discuss this with AM provider.

## 2020-10-13 NOTE — Care Management Important Message (Signed)
Important Message  Patient Details  Name: Jasmine [name] MRN: [id number] Date of Birth: 1956-02-25   Medicare Important Message Given:  Yes     [name] 10/13/2020, 12:46 PM

## 2020-10-13 NOTE — Plan of Care (Signed)
  Problem: Health Behavior/Discharge Planning: Goal: Ability to manage health-related needs will improve Outcome: Progressing   

## 2020-10-13 NOTE — Progress Notes (Signed)
PROGRESS NOTE  Jasmine [name] NKN:[id number] DOB: 12-02-1955 DOA: 10/10/2020 PCP: Janora Norlander, DO  Brief History:  65 y.o. female with medical history significant of seasonal allergies, normocytic anemia, history of blood transfusion, anxiety, osteoarthritis, COPD, active light smoker, chronic back pain, easy bruising, GERD, headache, nephrolithiasis, hypertension, hyperglycemia, neuropathy, normocytic anemia, history of pancreatitis, right lung cancer, sciatica, seizures as a child who is coming to the emergency department with complaints of numerous episodes of nausea/emesis and diarrhea associated with subjective fever at home since Thursday.  Denies any constipation, melena or hematochezia.  No dysuria, flank pain, frequency or hematuria.  She denied travel history or sick contacts.  She had a COVID test at home that was negative.  No rhinorrhea, sore throat, hemoptysis, chest pain, palpitations, diaphoresis, PND, orthopnea or pitting edema lower extremities.  She has felt lightheaded on occasion.  No polyuria, polydipsia, polyphagia or blurred vision.  Assessment/Plan:   Acute gastroenteritis -  -Pt reports no known sick contacts.   -She was admitted for IV fluids, IV nausea medication, electrolyte replacement and other supportive measures.  GI pathogen panel still pending. C diff testing has been negative.    -portable abdomen xray no acute findings, no severe constipation (given high dose opioid use).   -Up with PT requested but patient refused   -still not tolerating diet advancement in last 24 hours -start zofran around the clock -increase protonix to bid -GI pathogen panel pending   Ketoacidosis -  -likely from severe dehydration and starvation ketosis -continue IV fluids and supportive measures.   -improved   Essential hypertension - ] -resumed home medications.   Chronic respiratory failure with hypoxia - -on 3.5L at home - secondary to COPD GOLD II - she is  resumed on home bronchodilators symbicorts and supplemental oxygen.    Hypokalemia/hypomagnesemia -IV an po replacement  GERD -  -protonix ordered   Chronic pain and opioid dependence - -resumed her home pain medication for basal pain control with IV ordered for severe breakthrough symptoms.     DVT prophylaxis: enoxaparin Code Status: Full Family Communication: none present Disposition: anticipate home when medically stable possibly tomorrow  Status is: Inpatient    The patient will require care spanning > 2 midnights and should be moved to inpatient because: IV treatments appropriate due to intensity of illness or inability to take PO and Inpatient level of care appropriate due to severity of illness   Dispo: The patient is from: Home              Anticipated d/c is to: Home              Patient currently is not medically stable to d/c.              Difficult to place patient No   Procedures: As Listed in Progress Note Above  Antibiotics: None  RN Pressure Injury Documentation:        Subjective:  Patient still feeling weak.  Did not want to advance diet yesterday.  Complains of usual back pain.  Denies cp, sob, vomiting, diarrhea, abd pain Objective: Vitals:   10/12/20 2003 10/13/20 0600 10/13/20 0619 10/13/20 0802  BP: (!) 141/88 (!) 169/85 140/84   Pulse: 78     Resp: 18     Temp: 98.2 F (36.8 C) 98 F (36.7 C)    TempSrc: Oral Oral    SpO2: 100% 100%  100%  Weight:  Height:        Intake/Output Summary (Last 24 hours) at 10/13/2020 1023 Last data filed at 10/12/2020 2200 Gross per 24 hour  Intake 240 ml  Output --  Net 240 ml   Weight change:  Exam:  General:  Pt is alert, follows commands appropriately, not in acute distress HEENT: No icterus, No thrush, No neck mass, [place]/AT Cardiovascular: RRR, S1/S2, no rubs, no gallops Respiratory: diminished BS but CTA Abdomen: Soft/+BS, non tender, non distended, no guarding Extremities: No edema, No  lymphangitis, No petechiae, No rashes, no synovitis   Data Reviewed: I have personally reviewed following labs and imaging studies Basic Metabolic Panel: Recent Labs  Lab 10/10/20 2104 10/11/20 0330 10/11/20 0453 10/11/20 1055 10/12/20 0255 10/13/20 0344  NA 133* 135  --  133* 140 138  K 3.6 3.6  --  2.9* 2.8* 2.9*  CL 94* 101  --  100 103 105  CO2 17* 20*  --  24 28 27   GLUCOSE 119* 109*  --  234* 111* 121*  BUN 11 9  --  5* <5* <5*  CREATININE 0.68 0.57  --  0.41* 0.38* 0.36*  CALCIUM 9.1 8.2*  --  8.2* 9.2 9.1  MG  --   --  1.8  --  1.7 1.4*   Liver Function Tests: Recent Labs  Lab 10/10/20 2104 10/12/20 0255 10/13/20 0344  AST 23 25 19   ALT 22 19 18   ALKPHOS 126 96 91  BILITOT 1.3* 0.7 0.5  PROT 8.0 6.4* 6.0*  ALBUMIN 3.9 3.3* 3.1*   Recent Labs  Lab 10/10/20 2104  LIPASE 27   No results for input(s): AMMONIA in the last 168 hours. Coagulation Profile: Recent Labs  Lab 10/10/20 2139  INR 0.9   CBC: Recent Labs  Lab 10/10/20 2104 10/12/20 0255 10/13/20 0344  WBC 8.5 6.0 4.4  NEUTROABS  --  3.1 2.1  HGB 12.0 11.5* 10.9*  HCT 39.9 38.9 36.1  MCV 105.3* 106.0* 104.6*  PLT 344 265 215   Cardiac Enzymes: No results for input(s): CKTOTAL, CKMB, CKMBINDEX, TROPONINI in the last 168 hours. BNP: Invalid input(s): POCBNP CBG: Recent Labs  Lab 10/12/20 1202 10/12/20 1602 10/12/20 2007 10/13/20 0306 10/13/20 0757  GLUCAP 148* 126* 101* 108* 132*   HbA1C: Recent Labs    10/11/20 0330  HGBA1C 5.1   Urine analysis:    Component Value Date/Time   COLORURINE YELLOW 10/11/2020 0017   APPEARANCEUR CLEAR 10/11/2020 0017   APPEARANCEUR Clear 07/26/2017 1636   LABSPEC 1.011 10/11/2020 0017   PHURINE 6.0 10/11/2020 0017   GLUCOSEU NEGATIVE 10/11/2020 0017   HGBUR NEGATIVE 10/11/2020 0017   BILIRUBINUR NEGATIVE 10/11/2020 0017   BILIRUBINUR Negative 07/26/2017 1636   KETONESUR 80 (A) 10/11/2020 0017   PROTEINUR 100 (A) 10/11/2020 0017    NITRITE NEGATIVE 10/11/2020 0017   LEUKOCYTESUR NEGATIVE 10/11/2020 0017   Sepsis Labs: @LABRCNTIP (procalcitonin:4,lacticidven:4) ) Recent Results (from the past 240 hour(s))  Resp Panel by RT-PCR (Flu A&B, Covid) Nasopharyngeal Swab     Status: None   Collection Time: 10/10/20  9:38 PM   Specimen: Nasopharyngeal Swab; Nasopharyngeal(NP) swabs in vial transport medium  Result Value Ref Range Status   SARS Coronavirus 2 by RT PCR NEGATIVE NEGATIVE Final    Comment: (NOTE) SARS-CoV-2 target nucleic acids are NOT DETECTED.  The SARS-CoV-2 RNA is generally detectable in upper respiratory specimens during the acute phase of infection. The lowest concentration of SARS-CoV-2 viral copies this assay can detect is  138 copies/mL. A negative result does not preclude SARS-Cov-2 infection and should not be used as the sole basis for treatment or other patient management decisions. A negative result may occur with  improper specimen collection/handling, submission of specimen other than nasopharyngeal swab, presence of viral mutation(s) within the areas targeted by this assay, and inadequate number of viral copies(<138 copies/mL). A negative result must be combined with clinical observations, patient history, and epidemiological information. The expected result is Negative.  Fact Sheet for Patients:  EntrepreneurPulse.com.au  Fact Sheet for Healthcare Providers:  IncredibleEmployment.be  This test is no t yet approved or cleared by the [place] FDA and  has been authorized for detection and/or diagnosis of SARS-CoV-2 by FDA under an Emergency Use Authorization (EUA). This EUA will remain  in effect (meaning this test can be used) for the duration of the COVID-19 declaration under Section 564(b)(1) of the Act, 21 U.S.C.section 360bbb-3(b)(1), unless the authorization is terminated  or revoked sooner.       Influenza A by PCR NEGATIVE NEGATIVE Final    Influenza B by PCR NEGATIVE NEGATIVE Final    Comment: (NOTE) The Xpert Xpress SARS-CoV-2/FLU/RSV plus assay is intended as an aid in the diagnosis of influenza from Nasopharyngeal swab specimens and should not be used as a sole basis for treatment. Nasal washings and aspirates are unacceptable for Xpert Xpress SARS-CoV-2/FLU/RSV testing.  Fact Sheet for Patients: EntrepreneurPulse.com.au  Fact Sheet for Healthcare Providers: IncredibleEmployment.be  This test is not yet approved or cleared by the [place] FDA and has been authorized for detection and/or diagnosis of SARS-CoV-2 by FDA under an Emergency Use Authorization (EUA). This EUA will remain in effect (meaning this test can be used) for the duration of the COVID-19 declaration under Section 564(b)(1) of the Act, 21 U.S.C. section 360bbb-3(b)(1), unless the authorization is terminated or revoked.  Performed at [facility], [address] [place], [place] [address]   Blood culture (routine single)     Status: None (Preliminary result)   Collection Time: 10/10/20  9:39 PM   Specimen: Right Antecubital; Blood  Result Value Ref Range Status   Specimen Description RIGHT ANTECUBITAL  Final   Special Requests   Final    BOTTLES DRAWN AEROBIC AND ANAEROBIC Blood Culture adequate volume   Culture   Final    NO GROWTH 3 DAYS Performed at [facility], [address] [place], [place] [address]    Report Status PENDING  Incomplete  C Difficile Quick Screen w PCR reflex     Status: None   Collection Time: 10/11/20  5:16 PM   Specimen: Stool  Result Value Ref Range Status   C Diff antigen NEGATIVE NEGATIVE Final   C Diff toxin NEGATIVE NEGATIVE Final   C Diff interpretation No C. difficile detected.  Final    Comment: Performed at [facility], [address], [place], [place] [address]     Scheduled Meds:  (feeding supplement) PROSource Plus  30 mL Oral BID BM   amLODipine  10 mg  Oral Daily   atorvastatin  40 mg Oral QPM   DULoxetine  90 mg Oral Daily   enoxaparin (LOVENOX) injection  30 mg Subcutaneous Q24H   feeding supplement  1 Container Oral TID BM   insulin aspart  0-9 Units Subcutaneous TID WC   mometasone-formoterol  2 puff Inhalation BID   ondansetron (ZOFRAN) IV  4 mg Intravenous Q6H   oxyCODONE  15 mg Oral Q12H   pantoprazole (PROTONIX) IV  40 mg Intravenous Q12H   potassium chloride  40 mEq Oral Once   tiZANidine  6 mg Oral TID   umeclidinium bromide  1 puff Inhalation Daily   Continuous Infusions:  0.9 % NaCl with KCl 20 mEq / L 100 mL/hr at 10/13/20 0849   magnesium sulfate bolus IVPB      Procedures/Studies: DG Chest 2 View  Result Date: 10/05/2020 CLINICAL DATA:  Status post lobectomy. EXAM: CHEST - 2 VIEW COMPARISON:  09/11/2020, 08/25/2020, 08/24/2020.  CT 08/25/2020. FINDINGS: Mediastinum hilar structures stable. Postsurgical changes right lung again noted without interim change. Stable right base pleural-parenchymal thickening consistent with scarring. Left lung is clear. No pneumothorax. Neurostimulator noted in stable position. Degenerative change thoracic spine. Prior cervical spine fusion. Prior lumbar spine fusion. IMPRESSION: Stable postsurgical changes right lung with stable right base pleural-parenchymal thickening consistent with scarring. Electronically Signed   By: Marcello Moores  Register   On: 10/05/2020 13:56   DG ABD ACUTE 2+V W 1V CHEST  Result Date: 10/10/2020 CLINICAL DATA:  Nausea and vomiting with shortness of breath, initial encounter EXAM: DG ABDOMEN ACUTE WITH 1 VIEW CHEST COMPARISON:  10/05/2020, 08/06/2020 FINDINGS: Cardiac shadow is stable. Spinal stimulator is again noted. Postsurgical changes are noted in the right lung with mild volume loss. No focal infiltrate or effusion is seen. Mild chronic interstitial changes are seen. Scattered large and small bowel gas is noted. No free air is noted. Postsurgical changes in the lower  lumbosacral spine are seen. No acute fracture is noted. No soft tissue abnormality is seen. IMPRESSION: Chronic changes in the chest and abdomen without acute abnormality. Electronically Signed   By: Inez Catalina M.D.   On: 10/10/2020 21:36   DG Abd Portable 1V  Result Date: 10/12/2020 CLINICAL DATA:  Gastroenteritis.  Chronic back pain. EXAM: PORTABLE ABDOMEN - 1 VIEW COMPARISON:  10/10/2020 FINDINGS: Stable appearance of spinal stimulator. Stable postoperative findings in the lower lumbar spine. Stable appearance of accentuated density along the right hemidiaphragm, chronic right pleural effusion is a possibility. Gas is present in the colon. No overt dilated bowel. Vascular calcifications noted. IMPRESSION: 1. Stable accentuated density along the right hemidiaphragm likely postoperative or due to chronic pleural fluid. 2. No acute intra-abdominal findings.  No dilated bowel identified. 3. Postoperative findings in the spine. No change in the appearance of the visualized part of the spinal stimulator apparatus. Electronically Signed   By: Van Clines M.D.   On: 10/12/2020 05:39   DG Foot Complete Left  Result Date: 09/18/2020 CLINICAL DATA:  Recent fall with pain and swelling EXAM: LEFT FOOT - COMPLETE 3+ VIEW COMPARISON:  None. FINDINGS: No fracture or malalignment. Advanced arthritis at the first MTP joint. Hypoplastic appearing fifth digit phalanges. IMPRESSION: No acute osseous abnormality Electronically Signed   By: Donavan Foil M.D.   On: 09/18/2020 16:35   DG Foot Complete Right  Result Date: 09/18/2020 CLINICAL DATA:  Recent fall with swelling EXAM: RIGHT FOOT COMPLETE - 3+ VIEW COMPARISON:  None. FINDINGS: Bones appear demineralized. No acute displaced fracture or malalignment. Dorsal soft tissue swelling. Small plantar calcaneal spur. Mild degenerative change at the first MTP joint IMPRESSION: No definite acute osseous abnormality Electronically Signed   By: Donavan Foil M.D.   On:  09/18/2020 16:37    Orson Eva, DO  [facility]  If 7PM-7AM, please contact night-coverage [email or web address] Password [email or web address] 10/13/2020, 10:23 AM   LOS: 2 days

## 2020-10-13 NOTE — Evaluation (Signed)
Physical Therapy Evaluation Patient Details Name: Jasmine [name] MRN: [id number] DOB: 05/26/1955 Today's Date: 10/13/2020   History of Present Illness  Jasmine [name] is a 65 y.o. female with medical history significant of seasonal allergies, normocytic anemia, history of blood transfusion, anxiety, osteoarthritis, COPD, active light smoker, chronic back pain, easy bruising, GERD, headache, nephrolithiasis, hypertension, hyperglycemia, neuropathy, normocytic anemia, history of pancreatitis, right lung cancer, sciatica, seizures as a child who is coming to the emergency department with complaints of numerous episodes of nausea/emesis and diarrhea associated with subjective fever at home since Thursday.  Denies any constipation, melena or hematochezia.  No dysuria, flank pain, frequency or hematuria.  She denied travel history or sick contacts.  She had a COVID test at home that was negative.  No rhinorrhea, sore throat, hemoptysis, chest pain, palpitations, diaphoresis, PND, orthopnea or pitting edema lower extremities.  She has felt lightheaded on occasion.  No polyuria, polydipsia, polyphagia or blurred vision.   Clinical Impression  Patient not eager to participate with physical therapy due to c/o pain but is agreeable. Patient transitions to long sitting independently and is able to maintain balance without assist. Patient not wishing to perform any further mobility today. RN stated patient had been ambulating in room without assist and patient stated she was near baseline. Patient educated on following up with OPPT if necessary and does not require additional PT services at this time. Patient discharged to care of nursing for ambulation daily as tolerated for length of stay.     Follow Up Recommendations No PT follow up    Equipment Recommendations  None recommended by PT    Recommendations for Other Services       Precautions / Restrictions Precautions Precautions:  Fall Restrictions Weight Bearing Restrictions: No      Mobility  Bed Mobility Overal bed mobility: Independent             General bed mobility comments: to transition to long sitting    Transfers                    Ambulation/Gait                Stairs            Wheelchair Mobility    Modified Rankin (Stroke Patients Only)       Balance   Sitting-balance support: No upper extremity supported Sitting balance-Leahy Scale: Good Sitting balance - Comments: in long sitting                                     Pertinent Vitals/Pain Pain Assessment: Faces Faces Pain Scale: Hurts little more Pain Location: neck and shoulders Pain Descriptors / Indicators: Sore Pain Intervention(s): Limited activity within patient's tolerance;Monitored during session;Repositioned;Premedicated before session;Patient requesting pain meds-RN notified    Home Living Family/patient expects to be discharged to:: Private residence Living Arrangements: Spouse/significant other Available Help at Discharge: Family;Available 24 hours/day Type of Home: House Home Access: Stairs to enter Entrance Stairs-Rails: Left Entrance Stairs-Number of Steps: 3 Home Layout: Able to live on main level with bedroom/bathroom;Two level Home Equipment: Walker - 4 wheels;Bedside commode;Shower seat;Grab bars - toilet;Grab bars - tub/shower;Wheelchair - manual;Cane - single point      Prior Function Level of Independence: Independent with assistive device(s)         Comments: household ambulation with rollator, husband assists PRN  Hand Dominance        Extremity/Trunk Assessment   Upper Extremity Assessment Upper Extremity Assessment: Generalized weakness    Lower Extremity Assessment Lower Extremity Assessment: Generalized weakness    Cervical / Trunk Assessment Cervical / Trunk Assessment: Normal  Communication   Communication: No difficulties   Cognition Arousal/Alertness: Awake/alert Behavior During Therapy: WFL for tasks assessed/performed Overall Cognitive Status: Within Functional Limits for tasks assessed                                        General Comments      Exercises     Assessment/Plan    PT Assessment Patent does not need any further PT services  PT Problem List         PT Treatment Interventions      PT Goals (Current goals can be found in the Care Plan section)  Acute Rehab PT Goals Patient Stated Goal: decrease pain PT Goal Formulation: With patient Time For Goal Achievement: 10/13/20 Potential to Achieve Goals: Fair    Frequency     Barriers to discharge        Co-evaluation               AM-PAC PT "6 Clicks" Mobility  Outcome Measure Help needed turning from your back to your side while in a flat bed without using bedrails?: None Help needed moving from lying on your back to sitting on the side of a flat bed without using bedrails?: None Help needed moving to and from a bed to a chair (including a wheelchair)?: None Help needed standing up from a chair using your arms (e.g., wheelchair or bedside chair)?: None Help needed to walk in hospital room?: A Little Help needed climbing 3-5 steps with a railing? : A Little 6 Click Score: 22    End of Session   Activity Tolerance: Patient limited by pain Patient left: in bed;with call bell/phone within reach Nurse Communication: Patient requests pain meds PT Visit Diagnosis: Other abnormalities of gait and mobility (R26.89);Muscle weakness (generalized) (M62.81)    Time: 3825-0539 PT Time Calculation (min) (ACUTE ONLY): 6 min   Charges:   PT Evaluation $PT Eval Low Complexity: 1 Low          12:56 PM, 10/13/20 Mearl Latin PT, DPT Physical Therapist at [facility]

## 2020-10-13 NOTE — TOC Initial Note (Signed)
Transition of Care [facility]) - Initial/Assessment Note    Patient Details  Name: Jasmine [name] MRN: [id number] Date of Birth: [birth year]  Transition of Care [facility]) CM/SW Contact:    Shade Flood, LCSW Phone Number: 10/13/2020, 11:14 AM  Clinical Narrative:                   Pt admitted from home. She lives with her husband in [place] and plan is for return to home at dc. MD anticipating dc today or tomorrow. PT has evaluated pt and stated that pt does not need any PT follow up at dc. No immediate TOC needs identified. Will be available if needs arise.  Expected Discharge Plan: Home/Self Care Barriers to Discharge: Continued Medical Work up   Patient Goals and CMS Choice Patient states their goals for this hospitalization and ongoing recovery are:: go home      Expected Discharge Plan and Services Expected Discharge Plan: Home/Self Care In-house Referral: Clinical Social Work     Living arrangements for the past 2 months: Single Family Home                                      Prior Living Arrangements/Services Living arrangements for the past 2 months: Single Family Home Lives with:: Spouse Patient language and need for interpreter reviewed:: Yes Do you feel safe going back to the place where you live?: Yes      Need for Family Participation in Patient Care: No (Comment) Care giver support system in place?: Yes (comment)   Criminal Activity/Legal Involvement Pertinent to Current Situation/Hospitalization: No - Comment as needed  Activities of Daily Living Home Assistive Devices/Equipment: Environmental consultant (specify type) Armed forces training and education officer) ADL Screening (condition at time of admission) Patient's cognitive ability adequate to safely complete daily activities?: Yes Is the patient deaf or have difficulty hearing?: No Does the patient have difficulty seeing, even when wearing glasses/contacts?: No Does the patient have difficulty concentrating, remembering, or making decisions?:  No Patient able to express need for assistance with ADLs?: Yes Does the patient have difficulty dressing or bathing?: Yes Independently performs ADLs?: No Communication: Independent Dressing (OT): Independent Grooming: Independent Feeding: Independent Bathing: Needs assistance Is this a change from baseline?: Pre-admission baseline Toileting: Independent In/Out Bed: Independent Walks in Home: Independent with device (comment) Does the patient have difficulty walking or climbing stairs?: Yes Weakness of Legs: Both (more on right side) Weakness of Arms/Hands: None  Permission Sought/Granted                  Emotional Assessment       Orientation: : Oriented to Self, Oriented to Place, Oriented to  Time, Oriented to Situation Alcohol / Substance Use: Not Applicable Psych Involvement: No (comment)  Admission diagnosis:  Acidosis [E87.2] Dehydration [E86.0] Acute gastroenteritis [K52.9] Gastroenteritis [K52.9] Patient Active Problem List   Diagnosis Date Noted   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[place]) 10/11/2020   Gastroenteritis 10/11/2020   Acute gastroenteritis 10/10/2020   Protein-calorie malnutrition, severe 08/26/2020   Chest pain 08/25/2020   Pleuritic chest pain 08/25/2020   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   GERD (gastroesophageal reflux disease) 04/22/2020   Abnormal CT of the abdomen 04/22/2020   Preventative health care 04/22/2020   Solitary pulmonary nodule 03/22/2020   Pulmonary infiltrate on chest x-ray 02/24/2020   Nocturnal cough 02/19/2020   Pancreatitis, acute 01/06/2019   Acute pancreatitis 01/05/2019   Cigarette smoker 08/25/2017  Chronic respiratory failure with hypoxia ([place]) 08/25/2017   Centrilobular emphysema ([place]) 07/09/2017   Aortic atherosclerosis ([place]) 07/09/2017   Hepatic steatosis 07/09/2017   Malnutrition of moderate degree 07/02/2017   Acute respiratory failure with hypoxia ([place]) 06/30/2017   COPD with acute exacerbation ([place]) 06/30/2017    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/2016   Tobacco abuse 08/04/2016   Iron deficiency anemia 07/11/2016   Easy bruising 07/10/2016   Lumbar stenosis 11/11/2015   Leg pain 09/24/2015   Vitamin D deficiency 07/29/2015   Chronic back pain 02/22/2015   HTN (hypertension) 02/22/2015   Chronic narcotic use 02/22/2015   COPD GOLD II  02/22/2015   Headache 02/22/2015   PCP:  Janora Norlander, DO Pharmacy:   [id number] - [place], [place] [address] [place] [address] Phone: [phone] Fax: [phone]  [facility] [phone] - [place], [place] - [address] [place] [address] Phone: [phone] Fax: [phone]     Social Determinants of Health (SDOH) Interventions    Readmission Risk Interventions Readmission Risk Prevention Plan 10/13/2020 01/07/2019  Transportation Screening Complete Complete  Palliative Care Screening - Not Applicable  Medication Review ([place]) Complete Complete  PCP or Specialist appointment within 3-5 days of discharge Complete -  [place] or Home Care Consult Complete -  SW Recovery Care/Counseling Consult Complete -  Palliative Care Screening Not Applicable -  [place] Not Applicable -  Some recent data might be hidden

## 2020-10-14 ENCOUNTER — Encounter (HOSPITAL_COMMUNITY): Payer: Medicare Other

## 2020-10-14 ENCOUNTER — Telehealth: Payer: Self-pay | Admitting: Internal Medicine

## 2020-10-14 LAB — CBC WITH DIFFERENTIAL/PLATELET
Abs Immature Granulocytes: 0.01 10*3/uL (ref 0.00–0.07)
Basophils Absolute: 0 10*3/uL (ref 0.0–0.1)
Basophils Relative: 1 %
Eosinophils Absolute: 0.1 10*3/uL (ref 0.0–0.5)
Eosinophils Relative: 3 %
HCT: 35 % — ABNORMAL LOW (ref 36.0–46.0)
Hemoglobin: 10.5 g/dL — ABNORMAL LOW (ref 12.0–15.0)
Immature Granulocytes: 0 %
Lymphocytes Relative: 24 %
Lymphs Abs: 1.2 10*3/uL (ref 0.7–4.0)
MCH: 31.9 pg (ref 26.0–34.0)
MCHC: 30 g/dL (ref 30.0–36.0)
MCV: 106.4 fL — ABNORMAL HIGH (ref 80.0–100.0)
Monocytes Absolute: 0.9 10*3/uL (ref 0.1–1.0)
Monocytes Relative: 17 %
Neutro Abs: 3 10*3/uL (ref 1.7–7.7)
Neutrophils Relative %: 55 %
Platelets: 217 10*3/uL (ref 150–400)
RBC: 3.29 MIL/uL — ABNORMAL LOW (ref 3.87–5.11)
RDW: 12.9 % (ref 11.5–15.5)
WBC: 5.2 10*3/uL (ref 4.0–10.5)
nRBC: 0 % (ref 0.0–0.2)

## 2020-10-14 LAB — COMPREHENSIVE METABOLIC PANEL
ALT: 18 U/L (ref 0–44)
AST: 16 U/L (ref 15–41)
Albumin: 2.9 g/dL — ABNORMAL LOW (ref 3.5–5.0)
Alkaline Phosphatase: 82 U/L (ref 38–126)
Anion gap: 6 (ref 5–15)
BUN: 9 mg/dL (ref 8–23)
CO2: 26 mmol/L (ref 22–32)
Calcium: 9.1 mg/dL (ref 8.9–10.3)
Chloride: 106 mmol/L (ref 98–111)
Creatinine, Ser: 0.35 mg/dL — ABNORMAL LOW (ref 0.44–1.00)
GFR, Estimated: >60 mL/min (ref >60)
Glucose, Bld: 111 mg/dL — ABNORMAL HIGH (ref 70–99)
Potassium: 3.5 mmol/L (ref 3.5–5.1)
Sodium: 138 mmol/L (ref 135–145)
Total Bilirubin: 0.4 mg/dL (ref 0.3–1.2)
Total Protein: 5.7 g/dL — ABNORMAL LOW (ref 6.5–8.1)

## 2020-10-14 LAB — GLUCOSE, CAPILLARY
Glucose-Capillary: 120 mg/dL — ABNORMAL HIGH (ref 70–99)
Glucose-Capillary: 122 mg/dL — ABNORMAL HIGH (ref 70–99)

## 2020-10-14 LAB — MAGNESIUM: Magnesium: 1.8 mg/dL (ref 1.7–2.4)

## 2020-10-14 NOTE — Discharge Summary (Signed)
Physician Discharge Summary  Jasmine [name]:[id number] DOB: 1955/06/19 DOA: 10/10/2020  PCP: Janora Norlander, DO  Admit date: 10/10/2020 Discharge date: 10/14/2020  Admitted From: Home Disposition:  Home   Recommendations for Outpatient Follow-up:  Follow up with PCP in 1-2 weeks Please obtain BMP/CBC in one week     Discharge Condition: Stable CODE STATUS:FULL Diet recommendation: Heart Healthy    Brief/Interim Summary: 65 y.o. female with medical history significant of seasonal allergies, normocytic anemia, history of blood transfusion, anxiety, osteoarthritis, COPD, active light smoker, chronic back pain, easy bruising, GERD, headache, nephrolithiasis, hypertension, hyperglycemia, neuropathy, normocytic anemia, history of pancreatitis, right lung cancer, sciatica, seizures as a child who is coming to the emergency department with complaints of numerous episodes of nausea/emesis and diarrhea associated with subjective fever at home since Thursday.  Denies any constipation, melena or hematochezia.  No dysuria, flank pain, frequency or hematuria.  She denied travel history or sick contacts.  She had a COVID test at home that was negative.  No rhinorrhea, sore throat, hemoptysis, chest pain, palpitations, diaphoresis, PND, orthopnea or pitting edema lower extremities.  She has felt lightheaded on occasion.  No polyuria, polydipsia, polyphagia or blurred vision.    Discharge Diagnoses:  Acute gastroenteritis -  -Pt reports no known sick contacts.   -She was admitted for IV fluids, IV nausea medication, electrolyte replacement and other supportive measures.  GI pathogen panel still pending. C diff testing has been negative.    -portable abdomen xray no acute findings, no severe constipation (given high dose opioid use).   -Up with PT requested but patient refused   -still not tolerating diet advancement in last 24 hours -start zofran around the clock -increase protonix to bid  during hospitalization -GI pathogen panel pending -Cdiff negative -tolerating soft diet at time of d/c without n/v/d   Ketoacidosis -  -likely from severe dehydration and starvation ketosis -continue IV fluids and supportive measures.   -improved   Essential hypertension - ] -resumed home medications.   Chronic respiratory failure with hypoxia - -on 3.5L at home - secondary to COPD GOLD II - she is resumed on home bronchodilators symbicorts and supplemental oxygen.  -stable   Hypokalemia/hypomagnesemia -IV an po replacement   GERD -  -protonix ordered   Chronic pain and opioid dependence - -resumed her home pain medication for basal pain control with IV ordered for severe breakthrough symptoms.     Discharge Instructions   Allergies as of 10/14/2020       Reactions   Lyrica [pregabalin] Other (See Comments)   EXTREME SHAKING/TREMBLING   Darvon [propoxyphene]    UNSPECIFIED REACTION    Gabapentin    Extreme shaking and trembling    Augmentin [amoxicillin-pot Clavulanate] Nausea And Vomiting   Has patient had a PCN reaction causing immediate rash, facial/tongue/throat swelling, SOB or lightheadedness with hypotension:no Has patient had a PCN reaction causing severe rash involving mucus membranes or skin necrosis:No Has patient had a PCN reaction that required hospitalization:No Has patient had a PCN reaction occurring within the last 10 years:Yes--ONLY N/V If all of the above answers are "NO", then may proceed with Cephalosporin use.   Erythromycin Itching, Rash      Vibramycin [doxycycline Calcium] Itching, Rash   "SPLOTCHING "        Medication List     TAKE these medications    albuterol 108 (90 Base) MCG/ACT inhaler Commonly known as: VENTOLIN HFA Inhale into the lungs every 6 (six) hours as  needed for wheezing or shortness of breath.   amLODipine 10 MG tablet Commonly known as: NORVASC Take 1 tablet (10 mg total) by mouth daily.   atorvastatin 40 MG  tablet Commonly known as: Lipitor Take 1 tablet (40 mg total) by mouth daily.   budesonide-formoterol 160-4.5 MCG/ACT inhaler Commonly known as: SYMBICORT USE 2 INHALATIONS TWICE A DAY What changed: See the new instructions.   Cholecalciferol 125 MCG (5000 UT) capsule Take 5,000 Units by mouth daily.   dextromethorphan 30 MG/5ML liquid Commonly known as: DELSYM Take 5 mLs (30 mg total) by mouth every 8 (eight) hours as needed for cough.   diclofenac sodium 1 % Gel Commonly known as: VOLTAREN Apply 1 application topically daily as needed for muscle pain.   DULoxetine 30 MG capsule Commonly known as: CYMBALTA Take 3 capsules (90 mg total) by mouth daily.   Ensure High Protein Liqd Drink 1 can 3 times daily.   ipratropium-albuterol 0.5-2.5 (3) MG/3ML Soln Commonly known as: DUONEB Inhale 3 mLs into the lungs 4 (four) times daily as needed (for shortness of breath/wheezing). *May use as needed   magnesium oxide 400 (241.3 Mg) MG tablet Commonly known as: MAG-OX Take 400 mg by mouth 2 (two) times daily.   Metanx 3-90.314-2-35 MG Caps TAKE 1 TABLET BY MOUTH TWO TIMES A DAY What changed: when to take this   nicotine 14 mg/24hr patch Commonly known as: Nicoderm CQ Place 1 patch (14 mg total) onto the skin daily.   omeprazole 40 MG capsule Commonly known as: PRILOSEC Take 1 capsule (40 mg total) by mouth 2 (two) times daily. Take 1 capsule 30 mins before breakfast and evening meal.   ondansetron 4 MG disintegrating tablet Commonly known as: Zofran ODT Take 1 tablet (4 mg total) by mouth every 8 (eight) hours as needed for nausea or vomiting.   oxyCODONE-acetaminophen 10-325 MG tablet Commonly known as: PERCOCET Take 1 tablet by mouth 5 (five) times daily.   OxyCONTIN 15 mg 12 hr tablet Generic drug: oxyCODONE Take 15 mg by mouth every 12 (twelve) hours.   OXYGEN Inhale 3 L into the lungs See admin instructions. continuous   Poly-Iron 150 Forte 150-0.025-1 MG  Caps Generic drug: Iron Polysacch Cmplx-B12-FA Take 1 capsule by mouth 2 (two) times daily.   Spiriva Respimat 2.5 MCG/ACT Aers Generic drug: Tiotropium Bromide Monohydrate USE 2 INHALATIONS DAILY What changed: See the new instructions.   tizanidine 6 MG capsule Commonly known as: ZANAFLEX Take 6 mg by mouth 3 (three) times daily.   traZODone 150 MG tablet Commonly known as: DESYREL TAKE 1 TO 2 TABLETS AT BEDTIME What changed: See the new instructions.        Allergies  Allergen Reactions   Lyrica [Pregabalin] Other (See Comments)    EXTREME SHAKING/TREMBLING   Darvon [Propoxyphene]     UNSPECIFIED REACTION    Gabapentin     Extreme shaking and trembling    Augmentin [Amoxicillin-Pot Clavulanate] Nausea And Vomiting    Has patient had a PCN reaction causing immediate rash, facial/tongue/throat swelling, SOB or lightheadedness with hypotension:no Has patient had a PCN reaction causing severe rash involving mucus membranes or skin necrosis:No Has patient had a PCN reaction that required hospitalization:No Has patient had a PCN reaction occurring within the last 10 years:Yes--ONLY N/V If all of the above answers are "NO", then may proceed with Cephalosporin use.    Erythromycin Itching and Rash        Vibramycin [Doxycycline Calcium] Itching and Rash    "  [place] "    Consultations: none   Procedures/Studies: DG Chest 2 View  Result Date: 10/05/2020 CLINICAL DATA:  Status post lobectomy. EXAM: CHEST - 2 VIEW COMPARISON:  09/11/2020, 08/25/2020, 08/24/2020.  CT 08/25/2020. FINDINGS: Mediastinum hilar structures stable. Postsurgical changes right lung again noted without interim change. Stable right base pleural-parenchymal thickening consistent with scarring. Left lung is clear. No pneumothorax. Neurostimulator noted in stable position. Degenerative change thoracic spine. Prior cervical spine fusion. Prior lumbar spine fusion. IMPRESSION: Stable postsurgical changes  right lung with stable right base pleural-parenchymal thickening consistent with scarring. Electronically Signed   By: Marcello Moores  Register   On: 10/05/2020 13:56   DG ABD ACUTE 2+V W 1V CHEST  Result Date: 10/10/2020 CLINICAL DATA:  Nausea and vomiting with shortness of breath, initial encounter EXAM: DG ABDOMEN ACUTE WITH 1 VIEW CHEST COMPARISON:  10/05/2020, 08/06/2020 FINDINGS: Cardiac shadow is stable. Spinal stimulator is again noted. Postsurgical changes are noted in the right lung with mild volume loss. No focal infiltrate or effusion is seen. Mild chronic interstitial changes are seen. Scattered large and small bowel gas is noted. No free air is noted. Postsurgical changes in the lower lumbosacral spine are seen. No acute fracture is noted. No soft tissue abnormality is seen. IMPRESSION: Chronic changes in the chest and abdomen without acute abnormality. Electronically Signed   By: Inez Catalina M.D.   On: 10/10/2020 21:36   DG Abd Portable 1V  Result Date: 10/12/2020 CLINICAL DATA:  Gastroenteritis.  Chronic back pain. EXAM: PORTABLE ABDOMEN - 1 VIEW COMPARISON:  10/10/2020 FINDINGS: Stable appearance of spinal stimulator. Stable postoperative findings in the lower lumbar spine. Stable appearance of accentuated density along the right hemidiaphragm, chronic right pleural effusion is a possibility. Gas is present in the colon. No overt dilated bowel. Vascular calcifications noted. IMPRESSION: 1. Stable accentuated density along the right hemidiaphragm likely postoperative or due to chronic pleural fluid. 2. No acute intra-abdominal findings.  No dilated bowel identified. 3. Postoperative findings in the spine. No change in the appearance of the visualized part of the spinal stimulator apparatus. Electronically Signed   By: Van Clines M.D.   On: 10/12/2020 05:39   DG Foot Complete Left  Result Date: 09/18/2020 CLINICAL DATA:  Recent fall with pain and swelling EXAM: LEFT FOOT - COMPLETE 3+ VIEW  COMPARISON:  None. FINDINGS: No fracture or malalignment. Advanced arthritis at the first MTP joint. Hypoplastic appearing fifth digit phalanges. IMPRESSION: No acute osseous abnormality Electronically Signed   By: Donavan Foil M.D.   On: 09/18/2020 16:35   DG Foot Complete Right  Result Date: 09/18/2020 CLINICAL DATA:  Recent fall with swelling EXAM: RIGHT FOOT COMPLETE - 3+ VIEW COMPARISON:  None. FINDINGS: Bones appear demineralized. No acute displaced fracture or malalignment. Dorsal soft tissue swelling. Small plantar calcaneal spur. Mild degenerative change at the first MTP joint IMPRESSION: No definite acute osseous abnormality Electronically Signed   By: Donavan Foil M.D.   On: 09/18/2020 16:37        Discharge Exam: Vitals:   10/14/20 0500 10/14/20 0758  BP: (!) 157/89   Pulse: 79   Resp: 16   Temp: 98.1 F (36.7 C)   SpO2: 100% 100%   Vitals:   10/13/20 1950 10/13/20 2013 10/14/20 0500 10/14/20 0758  BP:  (!) 165/93 (!) 157/89   Pulse:  79 79   Resp:  18 16   Temp:  98.1 F (36.7 C) 98.1 F (36.7 C)   TempSrc:  Oral  Oral   SpO2: 100% 100% 100% 100%  Weight:      Height:        General: Pt is alert, awake, not in acute distress Cardiovascular: RRR, S1/S2 +, no rubs, no gallops Respiratory: diminished BS.  Bibasilar rales. No wheeze Abdominal: Soft, NT, ND, bowel sounds + Extremities: no edema, no cyanosis   The results of significant diagnostics from this hospitalization (including imaging, microbiology, ancillary and laboratory) are listed below for reference.    Significant Diagnostic Studies: DG Chest 2 View  Result Date: 10/05/2020 CLINICAL DATA:  Status post lobectomy. EXAM: CHEST - 2 VIEW COMPARISON:  09/11/2020, 08/25/2020, 08/24/2020.  CT 08/25/2020. FINDINGS: Mediastinum hilar structures stable. Postsurgical changes right lung again noted without interim change. Stable right base pleural-parenchymal thickening consistent with scarring. Left lung is  clear. No pneumothorax. Neurostimulator noted in stable position. Degenerative change thoracic spine. Prior cervical spine fusion. Prior lumbar spine fusion. IMPRESSION: Stable postsurgical changes right lung with stable right base pleural-parenchymal thickening consistent with scarring. Electronically Signed   By: Marcello Moores  Register   On: 10/05/2020 13:56   DG ABD ACUTE 2+V W 1V CHEST  Result Date: 10/10/2020 CLINICAL DATA:  Nausea and vomiting with shortness of breath, initial encounter EXAM: DG ABDOMEN ACUTE WITH 1 VIEW CHEST COMPARISON:  10/05/2020, 08/06/2020 FINDINGS: Cardiac shadow is stable. Spinal stimulator is again noted. Postsurgical changes are noted in the right lung with mild volume loss. No focal infiltrate or effusion is seen. Mild chronic interstitial changes are seen. Scattered large and small bowel gas is noted. No free air is noted. Postsurgical changes in the lower lumbosacral spine are seen. No acute fracture is noted. No soft tissue abnormality is seen. IMPRESSION: Chronic changes in the chest and abdomen without acute abnormality. Electronically Signed   By: Inez Catalina M.D.   On: 10/10/2020 21:36   DG Abd Portable 1V  Result Date: 10/12/2020 CLINICAL DATA:  Gastroenteritis.  Chronic back pain. EXAM: PORTABLE ABDOMEN - 1 VIEW COMPARISON:  10/10/2020 FINDINGS: Stable appearance of spinal stimulator. Stable postoperative findings in the lower lumbar spine. Stable appearance of accentuated density along the right hemidiaphragm, chronic right pleural effusion is a possibility. Gas is present in the colon. No overt dilated bowel. Vascular calcifications noted. IMPRESSION: 1. Stable accentuated density along the right hemidiaphragm likely postoperative or due to chronic pleural fluid. 2. No acute intra-abdominal findings.  No dilated bowel identified. 3. Postoperative findings in the spine. No change in the appearance of the visualized part of the spinal stimulator apparatus. Electronically  Signed   By: Van Clines M.D.   On: 10/12/2020 05:39   DG Foot Complete Left  Result Date: 09/18/2020 CLINICAL DATA:  Recent fall with pain and swelling EXAM: LEFT FOOT - COMPLETE 3+ VIEW COMPARISON:  None. FINDINGS: No fracture or malalignment. Advanced arthritis at the first MTP joint. Hypoplastic appearing fifth digit phalanges. IMPRESSION: No acute osseous abnormality Electronically Signed   By: Donavan Foil M.D.   On: 09/18/2020 16:35   DG Foot Complete Right  Result Date: 09/18/2020 CLINICAL DATA:  Recent fall with swelling EXAM: RIGHT FOOT COMPLETE - 3+ VIEW COMPARISON:  None. FINDINGS: Bones appear demineralized. No acute displaced fracture or malalignment. Dorsal soft tissue swelling. Small plantar calcaneal spur. Mild degenerative change at the first MTP joint IMPRESSION: No definite acute osseous abnormality Electronically Signed   By: Donavan Foil M.D.   On: 09/18/2020 16:37    Microbiology: Recent Results (from the past 240 hour(s))  Resp Panel by RT-PCR (Flu A&B, Covid) Nasopharyngeal Swab     Status: None   Collection Time: 10/10/20  9:38 PM   Specimen: Nasopharyngeal Swab; Nasopharyngeal(NP) swabs in vial transport medium  Result Value Ref Range Status   SARS Coronavirus 2 by RT PCR NEGATIVE NEGATIVE Final    Comment: (NOTE) SARS-CoV-2 target nucleic acids are NOT DETECTED.  The SARS-CoV-2 RNA is generally detectable in upper respiratory specimens during the acute phase of infection. The lowest concentration of SARS-CoV-2 viral copies this assay can detect is 138 copies/mL. A negative result does not preclude SARS-Cov-2 infection and should not be used as the sole basis for treatment or other patient management decisions. A negative result may occur with  improper specimen collection/handling, submission of specimen other than nasopharyngeal swab, presence of viral mutation(s) within the areas targeted by this assay, and inadequate number of viral copies(<138  copies/mL). A negative result must be combined with clinical observations, patient history, and epidemiological information. The expected result is Negative.  Fact Sheet for Patients:  EntrepreneurPulse.com.au  Fact Sheet for Healthcare Providers:  IncredibleEmployment.be  This test is no t yet approved or cleared by the [place] FDA and  has been authorized for detection and/or diagnosis of SARS-CoV-2 by FDA under an Emergency Use Authorization (EUA). This EUA will remain  in effect (meaning this test can be used) for the duration of the COVID-19 declaration under Section 564(b)(1) of the Act, 21 U.S.C.section 360bbb-3(b)(1), unless the authorization is terminated  or revoked sooner.       Influenza A by PCR NEGATIVE NEGATIVE Final   Influenza B by PCR NEGATIVE NEGATIVE Final    Comment: (NOTE) The Xpert Xpress SARS-CoV-2/FLU/RSV plus assay is intended as an aid in the diagnosis of influenza from Nasopharyngeal swab specimens and should not be used as a sole basis for treatment. Nasal washings and aspirates are unacceptable for Xpert Xpress SARS-CoV-2/FLU/RSV testing.  Fact Sheet for Patients: EntrepreneurPulse.com.au  Fact Sheet for Healthcare Providers: IncredibleEmployment.be  This test is not yet approved or cleared by the [place] FDA and has been authorized for detection and/or diagnosis of SARS-CoV-2 by FDA under an Emergency Use Authorization (EUA). This EUA will remain in effect (meaning this test can be used) for the duration of the COVID-19 declaration under Section 564(b)(1) of the Act, 21 U.S.C. section 360bbb-3(b)(1), unless the authorization is terminated or revoked.  Performed at [facility], [address], [place], [place] [address]   Blood culture (routine single)     Status: None (Preliminary result)   Collection Time: 10/10/20  9:39 PM   Specimen: Right Antecubital;  Blood  Result Value Ref Range Status   Specimen Description RIGHT ANTECUBITAL  Final   Special Requests   Final    BOTTLES DRAWN AEROBIC AND ANAEROBIC Blood Culture adequate volume   Culture   Final    NO GROWTH 3 DAYS Performed at [facility], [address] [place], [place] [address]    Report Status PENDING  Incomplete  C Difficile Quick Screen w PCR reflex     Status: None   Collection Time: 10/11/20  5:16 PM   Specimen: Stool  Result Value Ref Range Status   C Diff antigen NEGATIVE NEGATIVE Final   C Diff toxin NEGATIVE NEGATIVE Final   C Diff interpretation No C. difficile detected.  Final    Comment: Performed at [facility], [address], [place], [place] [address]     Labs: Basic Metabolic Panel: Recent Labs  Lab 10/11/20 0330 10/11/20 0453 10/11/20 1055 10/12/20 0255 10/13/20 0344 10/14/20 0317  NA 135  --  133* 140 138 138  K 3.6  --  2.9* 2.8* 2.9* 3.5  CL 101  --  100 103 105 106  CO2 20*  --  24 28 27 26   GLUCOSE 109*  --  234* 111* 121* 111*  BUN 9  --  5* <5* <5* 9  CREATININE 0.57  --  0.41* 0.38* 0.36* 0.35*  CALCIUM 8.2*  --  8.2* 9.2 9.1 9.1  MG  --  1.8  --  1.7 1.4* 1.8   Liver Function Tests: Recent Labs  Lab 10/10/20 2104 10/12/20 0255 10/13/20 0344 10/14/20 0317  AST 23 25 19 16   ALT 22 19 18 18   ALKPHOS 126 96 91 82  BILITOT 1.3* 0.7 0.5 0.4  PROT 8.0 6.4* 6.0* 5.7*  ALBUMIN 3.9 3.3* 3.1* 2.9*   Recent Labs  Lab 10/10/20 2104  LIPASE 27   No results for input(s): AMMONIA in the last 168 hours. CBC: Recent Labs  Lab 10/10/20 2104 10/12/20 0255 10/13/20 0344 10/14/20 0317  WBC 8.5 6.0 4.4 5.2  NEUTROABS  --  3.1 2.1 3.0  HGB 12.0 11.5* 10.9* 10.5*  HCT 39.9 38.9 36.1 35.0*  MCV 105.3* 106.0* 104.6* 106.4*  PLT 344 265 215 217   Cardiac Enzymes: No results for input(s): CKTOTAL, CKMB, CKMBINDEX, TROPONINI in the last 168 hours. BNP: Invalid input(s): POCBNP CBG: Recent Labs  Lab 10/13/20 1132 10/13/20 1652  10/13/20 2028 10/14/20 0254 10/14/20 0755  GLUCAP 176* 91 155* 120* 122*    Time coordinating discharge:  36 minutes  Signed:  Orson Eva, DO [facility] Pager: [phone] 10/14/2020, 9:48 AM

## 2020-10-14 NOTE — Telephone Encounter (Signed)
Spoke with the pt  She states that she is due to recert for o2  Advised will need appt for this and it's already scheduled for oct  Declined sooner appt with APP in [place] and she declined  She will inform adapt of her upcoming appt

## 2020-10-14 NOTE — Progress Notes (Signed)
Discharge instructions reviewed with patient and husband. Both verbalized understanding of instructions. Patient discharged home in stable condition with husband.

## 2020-10-15 ENCOUNTER — Ambulatory Visit: Payer: Medicare Other | Admitting: Internal Medicine

## 2020-10-15 ENCOUNTER — Telehealth: Payer: Self-pay

## 2020-10-15 LAB — GASTROINTESTINAL PANEL BY PCR, STOOL (REPLACES STOOL CULTURE)

## 2020-10-15 LAB — CULTURE, BLOOD (SINGLE)
Culture: NO GROWTH
Special Requests: ADEQUATE

## 2020-10-15 NOTE — Telephone Encounter (Signed)
Transition Care Management Unsuccessful Follow-up Telephone Call  Date of discharge and from where:  [facility] 8/11  Diagnosis:  acute gastroenteritis   Attempts:  1st Attempt  Reason for unsuccessful TCM follow-up call:  Left voice message on pt home #   Transition Care Management Unsuccessful Follow-up Telephone Call  Date of discharge and from where:  [facility] 10/14/20  Diagnosis: acute gastroenteritis   Attempts:  2nd Attempt  Reason for unsuccessful TCM follow-up call:  Unable to leave message no voice mail set up on pt cell #

## 2020-10-19 ENCOUNTER — Encounter (HOSPITAL_COMMUNITY): Payer: Medicare Other

## 2020-10-20 NOTE — Telephone Encounter (Signed)
Transition Care Management Unsuccessful Follow-up Telephone Call  Date of discharge and from where:  [facility] 10/14/20  Diagnosis: acute gastroenteritis   Attempts:  3rd Attempt  Reason for unsuccessful TCM follow-up call:  Unable to reach patient

## 2020-10-21 ENCOUNTER — Encounter (HOSPITAL_COMMUNITY): Payer: Medicare Other

## 2020-11-03 ENCOUNTER — Encounter: Payer: Self-pay | Admitting: Internal Medicine

## 2020-11-03 ENCOUNTER — Other Ambulatory Visit: Payer: Self-pay

## 2020-11-03 ENCOUNTER — Ambulatory Visit (INDEPENDENT_AMBULATORY_CARE_PROVIDER_SITE_OTHER): Payer: Medicare Other | Admitting: Internal Medicine

## 2020-11-03 VITALS — BP 110/58 | HR 108 | Temp 98.2°F | Ht 60.0 in | Wt 92.1 lb

## 2020-11-03 DIAGNOSIS — J449 Chronic obstructive pulmonary disease, unspecified: Secondary | ICD-10-CM | POA: Diagnosis not present

## 2020-11-03 DIAGNOSIS — J9611 Chronic respiratory failure with hypoxia: Secondary | ICD-10-CM | POA: Diagnosis not present

## 2020-11-03 DIAGNOSIS — F17210 Nicotine dependence, cigarettes, uncomplicated: Secondary | ICD-10-CM

## 2020-11-03 NOTE — Progress Notes (Signed)
Subjective:     Patient ID: Jasmine [name], female   DOB: 05-15-1955    MRN: [id number]    Brief patient profile: 79    yowf   active smoker/MM  dx with asthma as child but by HS outgrew it until her 89's with flares but better between until early 2000s and maintain on multliple inhalers and then 02/2016 hosp in [place] then [facility] April 2019 and newly started on 02 = 2lpm as needed daytime and hs and referred to pulmonary clinic 08/24/2017 by Dr   Danice Goltz with GOLD II spirometry documented 09/21/17     Admit date: 06/30/2017 Discharge date: 07/09/2017     Recommendations for Outpatient Follow-up:  pls refer to sleep medicine for osa testing/epworht/stopbang pls continue cessation counseling for Tob Needs cxr 1 mo Consider Megace/chantix? Recheck LFT and Magnesium in 1-2 weeks---has elevated LFT unknown sig [?CVC liver]   Discharge Diagnoses:  Principal Problem:   Acute respiratory failure with hypoxia (HCC) Active Problems:   Chronic back pain   HTN (hypertension)   Chronic narcotic use   Hypokalemia   COPD with acute exacerbation (HCC)   Malnutrition of moderate degree     Discharge Condition: please refert   Diet recommendation: eat anything to gain weight for now        [facility] Weights    06/30/17 2122 07/01/17 0500 07/02/17 0412  Weight: 47.2 kg (104 lb 0.9 oz) 47.2 kg (104 lb 0.9 oz) 48.1 kg (106 lb 0.7 oz)      History of present illness:  Please see note from Dr. Paulene Floor "65 y.o. female with a history of COPD, GERD, neuropathy, chronic pain on chronic narcotics, hypertension.  Patient presents with worsening shortness of breath over the past 3 to 4 weeks.  She presented to ED with acute respiratory distress with hypoxia and subsequently started on bipap"       Hospital Course:  Acute respiratory failure with hypoxia-patient says that she responded very well to nightly BiPAP.  She was able to sleep for the first time in a very long time. ?OSA-referrinf  to Sleep as OP.  Needs screening fro pulm htn as well? Could benefit from Cardio-pulm rehab if avail  [place]ation- continue current treatments with IV steroids, antibiotics and continuous around-the-clock neb treatments-narrowed and tapered on d/c home--5 day steroid taper Essential Hypertension-stable. Hypokalemia-replacement ordered.  recheck mag as op Chronic pain with opioid dependence-resume home pain med management regimen which has been stable per patient.          08/24/2017 1st [facility] office visit/ Jasmine [name]   Chief Complaint  Patient presents with   Pulmonary Consult    Referred by Dr. Adam Phenix.  Pt states dxed with COPD approx 10 years ago. She states she was hospitalized in April 2019 for COPD flare and sent home with o2.  She states she gets SOB with minimal exertion such as walking from room to room at home. She uses proair 3 x per wk on average and has duoneb but has not needed it since starting o2.    MMRC3 = can't walk 100 yards even at a slow pace at a flat grade s stopping due to sob  Even on 02  Rattling cough more prod first thing in am = min mucoid  maint on symbicort 160    Off prednisone x sev weeks and about the same since stopped just on symbicort 160 2bid  And  2lpm with activity  Sleeping  2lpm and 30 degrees    rec Plan A = Automatic =   Symbicort 160  Take 2 puffs first thing in am and then another 2 puffs about 12 hours later and spiriva 2 pffs each am  Work on inhaler technique:     Plan B = Backup Only use your albuterol (Proair)as a rescue medication Plan C = Crisis - only use your albuterol nebulizer if you first try Plan B      08/05/2019  f/u ov/Jasmine [name] re:  GOLD II/ symb /spiriva still smoking  - had Cochran vaccine  Chief Complaint  Patient presents with   Follow-up    Breathing has been worse over the past 2 months- she is waking up in the morning feeling SOB and has to use her rescue inhaler. She is  using the rescue inhaler most days.   Dyspnea: foot slows her down> sob  Cough: minimal in am / white  Sleeping: on 02 flat bed  SABA use: not using symb first thing as rec and uses saba first  02: 2lpm hs only / wants portable system rec Plan A = Automatic = Always=    symbicort then spiriva first thing then symbicot 12 hours later  Work on inhaler technique:  Plan B = Backup (to supplement plan A, not to replace it) Only use your albuterol inhaler as a rescue medication Plan C = Crisis (instead of Plan B but only if Plan B stops working) - only use your albuterol nebulizer if you first try Plan B and it fails to help > ok to use the nebulizer up to every 4 hours but if start needing it regularly call for immediate appointment The key is to stop smoking completely before smoking completely stops you!       02/19/2020  f/u ov/[place] office/Jasmine [name] re: GOLD II/ 02 dep still smoking Chief Complaint  Patient presents with   Follow-up    Here for o2 recert- c/o waking up in the night coughing and "gagging" x 3 months. Cough is occ prod with clear sputum. She is using her albuterol inhaler about once per day.   Dyspnea: foot drop limiting / around the house doe  Cough: nightly cough/ gag 4 h after hs  and continues until gets up and uses saba in am / min clear mucus  Sleeping: bed is flat with wedge  SABA use: as above/ no neb  02: 2lpm hs  On ppi with breakfast  Rec Protonix 40 mg Take 30- 60 min before your first and last meals of the day  GERD diet reviewed, bed blocks rec  We will be scheduling you ONO on room air to qualify you for 02  Please remember to go to the  x-ray department  pos SPN CTchest 03/19/20 : 1. 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 After surgery and lately she reports a cough with clear mucus.  - MR   Dyspnea:  limited by R foot drop / using rollator  Cough: still coughing thick clear mucus esp in am / using delyms  Sleeping: bed is flat/ some am congestion  SABA use: too much  02: 4lpm 24/7      No obvious day to day or daytime variability or assoc  purulent sputum or mucus plugs or hemoptysis or cp or chest tightness, subjective wheeze or overt sinus or hb symptoms.   Sleeping  without nocturnal    exacerbation  of respiratory  c/o's or need for noct saba. Also denies any obvious fluctuation of symptoms with weather or environmental changes or other aggravating or alleviating factors except as outlined above   No unusual exposure hx or h/o childhood pna/ asthma or knowledge of premature birth.  Current Allergies, Complete Past Medical History, Past Surgical History, Family History, and Social History were reviewed in [employer] record.  ROS  The following are not active complaints unless bolded Hoarseness, sore throat, dysphagia, dental problems, itching, sneezing,  nasal congestion or discharge of excess mucus or purulent secretions, ear ache,   fever, chills, sweats, unintended wt loss or wt gain, classically pleuritic or exertional cp,  orthopnea pnd or arm/hand swelling  or leg swelling, presyncope, palpitations, abdominal pain, anorexia, nausea, vomiting, diarrhea  or change in bowel habits or change in bladder habits, change in stools or change in urine, dysuria, hematuria,  rash, arthralgias, visual complaints, headache, numbness, weakness or ataxia or problems with walking or coordination,   change in mood or  memory.        Current Meds  Medication Sig   albuterol (VENTOLIN HFA) 108 (90 Base) MCG/ACT inhaler Inhale into the lungs every 6 (six) hours as needed for wheezing or shortness of breath.   amLODipine (NORVASC) 10 MG tablet Take 1 tablet (10 mg total) by mouth daily.   atorvastatin (LIPITOR) 40 MG tablet Take 1 tablet (40 mg total) by mouth daily.   budesonide-formoterol (SYMBICORT) 160-4.5 MCG/ACT inhaler USE 2 INHALATIONS TWICE A DAY   Cholecalciferol 125 MCG (5000 UT) capsule Take 5,000 Units by mouth daily.   dextromethorphan (DELSYM) 30 MG/5ML liquid Take 5 mLs (30 mg total) by mouth every 8 (eight) hours as needed for cough.   diclofenac sodium (VOLTAREN) 1 % GEL Apply 1 application topically daily as needed for muscle pain.   DULoxetine (CYMBALTA) 30 MG capsule Take 3 capsules (90 mg total) by mouth daily.   ipratropium-albuterol (DUONEB) 0.5-2.5 (3) MG/3ML SOLN Inhale 3 mLs into the lungs 4 (four) times daily as needed (for shortness of breath/wheezing). *May use as needed   Iron Polysacch Cmplx-B12-FA (POLY-IRON 150 FORTE) 150-0.025-1 MG CAPS Take 1 capsule by mouth 2 (two) times daily.   L-Methylfolate-Algae-B12-B6 (METANX) 3-90.314-2-35 MG CAPS TAKE 1 TABLET BY MOUTH TWO TIMES A DAY (Patient taking differently: Take 1 tablet by mouth in the morning and at bedtime.)   magnesium oxide (MAG-OX) 400 (241.3 Mg) MG tablet Take 400 mg by mouth 2 (two) times daily.   nicotine (NICODERM CQ) 14 mg/24hr patch Place 1 patch (14 mg total) onto the skin daily.   Nutritional Supplements (ENSURE HIGH PROTEIN) LIQD Drink 1 can 3 times daily.   omeprazole (PRILOSEC) 40 MG capsule Take 1 capsule (40 mg total) by mouth 2 (two) times daily. Take 1 capsule  30 mins before breakfast and evening meal.   ondansetron (ZOFRAN ODT) 4 MG disintegrating tablet Take 1 tablet (4 mg total) by mouth every 8 (eight) hours as needed for nausea or vomiting.   oxyCODONE-acetaminophen (PERCOCET) 10-325  MG tablet Take 1 tablet by mouth 5 (five) times daily.   OXYCONTIN 15 MG 12 hr tablet Take 15 mg by mouth every 12 (twelve) hours.   OXYGEN Inhale 3 L into the lungs See admin instructions. continuous   SPIRIVA RESPIMAT 2.5 MCG/ACT AERS USE 2 INHALATIONS DAILY (Patient taking differently: Inhale 2 puffs into the lungs daily.)   tizanidine (ZANAFLEX) 6 MG capsule Take 6 mg by mouth 3 (three) times daily.   traZODone (DESYREL) 150 MG tablet TAKE 1 TO 2 TABLETS AT BEDTIME (Patient taking differently: Take 150-300 mg by mouth at bedtime.)                       Objective:   Physical Exam    11/03/2020         92 02/19/2020       96  08/05/2019           98.6  01/09/2018        105  09/21/2017        104   08/24/17 105 lb 9.6 oz (47.9 kg)  07/31/17 106 lb (48.1 kg)  07/26/17 105 lb 3.2 oz (47.7 kg)     Vital signs reviewed  11/03/2020  - Note at rest 02 sats  96% on RA   General appearance:    chronically ill wf  amb / rollator      HEENT : pt wearing mask not removed for exam due to covid -19 concerns.    NECK :  without JVD/Nodes/TM/ nl carotid upstrokes bilaterally   LUNGS: no acc muscle use,  Mod barrel  contour chest wall with bilateral  Distant bs s audible wheeze and  without cough on insp or exp maneuvers and mod  Hyperresonant  to  percussion bilaterally     CV:  RRR  no s3 or murmur or increase in P2, and no edema   ABD:  soft and nontender with pos mid insp Hoover's  in the supine position. No bruits or organomegaly appreciated, bowel sounds nl  MS:     ext warm without deformities, calf tenderness, cyanosis or clubbing No obvious joint restrictions   SKIN: warm and dry without lesions    NEURO:  alert, approp, nl sensorium with  R foot drop/ in ankle brace.          CXR PA and Lateral:   02/19/2020 :    I personally reviewed images and agree with radiology impression as follows:   Did not go for cxr as rec        Assessment:

## 2020-11-03 NOTE — Patient Instructions (Addendum)
For cough/ congestion > mucinex dm 1200 mg every 12 hours as needed and use flutter valve   The key is to stop smoking completely before smoking completely stops you!   Plan A = Automatic = Always=    symbicort 160 / spiriva 2 of each then 12 hours later 2 puffs of symbicort   Plan B = Backup (to supplement plan A, not to replace it) Only use your albuterol inhaler as a rescue medication to be used if you can't catch your breath by resting or doing a relaxed purse lip breathing pattern.  - The less you use it, the better it will work when you need it. - Ok to use the inhaler up to 2 puffs  every 4 hours if you must but call for appointment if use goes up over your usual need - Don't leave home without it !!  (think of it like the spare tire for your car)   Plan C = Crisis (instead of Plan B but only if Plan B stops working) - only use your albuterol nebulizer if you first try Plan B and it fails to help > ok to use the nebulizer up to every 4 hours but if start needing it regularly call for immediate appointment   Ok to try albuterol 15 min before an activity (on alternating days)  that you know would usually make you short of breath and see if it makes any difference and if makes none then don't take albuterol after activity unless you can't catch your breath as this means it's the resting that helps, not the albuterol.  We will contact [employer] [facility] to do a best fit evaluation  - late add did not need amb 02 based on today's eval   Make sure you check your oxygen saturation  at your highest level of activity  to be sure it stays over 90% and adjust  02 flow upward to maintain this level if needed but remember to turn it back to previous settings when you stop (to conserve your supply).   Please schedule a follow up visit in 3 months but call sooner if needed

## 2020-11-04 ENCOUNTER — Encounter: Payer: Self-pay | Admitting: Internal Medicine

## 2020-11-04 NOTE — Assessment & Plan Note (Signed)
4-5 min discussion re active cigarette smoking in addition to office E&M  Ask about tobacco use:   Ongoing  Advise quitting   I took an extended  opportunity with this patient to outline the consequences of continued cigarette use  in airway disorders based on all the data we have from the multiple national lung health studies (perfomed over decades at millions of dollars in cost)  indicating that smoking cessation, not choice of inhalers or physicians, is the most important aspect of her care and will improve mucociliary clearance, a main issue for her, w/in 6 weeks of stopping  Assess willingness:  Not committed at this point Assist in quit attempt:  Per PCP when ready Arrange follow up:   Follow up per Primary Care planned

## 2020-11-04 NOTE — Assessment & Plan Note (Signed)
As of 08/24/2017  DME- [facility]  o2 2lpm with sleep and as needed daytime -  12/02/2018 ambulatory O2 walk test with no desaturations.   -  08/05/2019   Walked RA  approx   200 ft  @ slow pace  stopped due to  Sob/ no desats    -  02/19/2020   Walked RA  approx  261ft  @ slow pace  stopped due to  Sob/ sats still 94%    - ONO RA  03/09/2020 :   desats on RA < 89% x 2 h 39 min > 03/23/2020 rec 2lpm   - 11/03/2020 no desats walking 450 ft RA   Continue noct 02/ monitor ex sats   Each maintenance medication was reviewed in detail including emphasizing most importantly the difference between maintenance and prns and under what circumstances the prns are to be triggered using an action plan format where appropriate.  Total time for H and P, chart review, counseling, reviewing hfa/smi device(s) , directly observing portions of ambulatory 02 saturation study/ and generating customized AVS unique to this office visit / same day charting = > 30 min

## 2020-11-04 NOTE — Assessment & Plan Note (Signed)
Active smoker 08/24/2017  > try spiriva respimat 2.5  X 2 puffs each am  - PFT's  09/21/2017  FEV1 1.61 (72 % ) ratio 63  p 14 % improvement from saba p nothing prior to study with DLCO  55 % corrects to 62 % for alv volume   - alpha one screen 09/21/2017  MM level 191  - 11/03/2020  After extensive coaching inhaler device,  effectiveness =    75% with smi and hfa > continue symb/spiriva    Group D in terms of symptom/risk and laba/lama/ICS  therefore appropriate rx at this point >>>  Continue sym/spiriva and work on technique and more approp saba  I spent extra time with pt today reviewing appropriate use of albuterol for prn use on exertion with the following points: 1) saba is for relief of sob that does not improve by walking a slower pace or resting but rather if the pt does not improve after trying this first. 2) If the pt is convinced, as many are, that saba helps recover from activity faster then it's easy to tell if this is the case by re-challenging : ie stop, take the inhaler, then p 5 minutes try the exact same activity (intensity of workload) that just caused the symptoms and see if they are substantially diminished or not after saba 3) if there is an activity that reproducibly causes the symptoms, try the saba 15 min before the activity on alternate days   If in fact the saba really does help, then fine to continue to use it prn but advised may need to look closer at the maintenance regimen being used to achieve better control of airways disease with exertion.    >>> changed cough rx to mucinex dm/ flutter

## 2020-11-05 ENCOUNTER — Encounter: Payer: Self-pay | Admitting: Family Medicine

## 2020-11-05 ENCOUNTER — Ambulatory Visit (INDEPENDENT_AMBULATORY_CARE_PROVIDER_SITE_OTHER): Payer: Medicare Other | Admitting: Family Medicine

## 2020-11-05 ENCOUNTER — Other Ambulatory Visit: Payer: Self-pay

## 2020-11-05 VITALS — BP 108/65 | HR 93 | Temp 98.4°F | Ht 60.0 in | Wt 93.2 lb

## 2020-11-05 DIAGNOSIS — R197 Diarrhea, unspecified: Secondary | ICD-10-CM

## 2020-11-05 DIAGNOSIS — E43 Unspecified severe protein-calorie malnutrition: Secondary | ICD-10-CM

## 2020-11-05 DIAGNOSIS — Z23 Encounter for immunization: Secondary | ICD-10-CM

## 2020-11-05 NOTE — Patient Instructions (Signed)
Bring the stool back Tues if possible. Blood results should be back by then. May try Xifaxan for 2 weeks if no infection found.

## 2020-11-05 NOTE — Progress Notes (Signed)
Subjective: CC: Diarrhea PCP: Jasmine Norlander, DO VXY:[id number] Jasmine [name] is a 65 y.o. female presenting to clinic today for:  1.  Diarrhea Patient reports ongoing loose stools.  These have been present for 4 weeks now.  Infection about 3 weeks ago she was hospitalized due to dehydration and other complications surrounding what sounded like a viral gastroenteritis.  No blood in stool.  Stools have been less watery but still quite loose.  This seemed to be most prevalent in the morning.  She takes anywhere from 2-4 doses of Imodium before lunchtime every day in efforts to reduce stooling and does note reduction towards the afternoon.  There is a family history of some type of colon disease but she is not quite sure what it is.  This was her maternal aunt.  She had a colonoscopy that had a few polyps recently but nothing out of the way as far as IBD goes.  She is hydrating regularly with water and Pedialyte.  She takes magnesium supplement.   ROS: Per HPI  Allergies  Allergen Reactions   Lyrica [Pregabalin] Other (See Comments)    EXTREME SHAKING/TREMBLING   Darvon [Propoxyphene]     UNSPECIFIED REACTION    Gabapentin     Extreme shaking and trembling    Augmentin [Amoxicillin-Pot Clavulanate] Nausea And Vomiting    Has patient had a PCN reaction causing immediate rash, facial/tongue/throat swelling, SOB or lightheadedness with hypotension:no Has patient had a PCN reaction causing severe rash involving mucus membranes or skin necrosis:No Has patient had a PCN reaction that required hospitalization:No Has patient had a PCN reaction occurring within the last 10 years:Yes--ONLY N/V If all of the above answers are "NO", then may proceed with Cephalosporin use.    Erythromycin Itching and Rash        Vibramycin [Doxycycline Calcium] Itching and Rash    "Jasmine [name] "   Past Medical History:  Diagnosis Date   Allergy    Anemia    Anxiety    Arthritis    Asthma    Carpal tunnel  syndrome    Chronic back pain    Cigarette smoker 3/74/8270   Complication of anesthesia    in the past has had N/V, the last few surgeries she has not been   COPD (chronic obstructive pulmonary disease) ([place])    Easy bruising 07/10/2016   GERD (gastroesophageal reflux disease)    Headache    Heart murmur 2005   never had any problems   History of blood transfusion 2018   History of kidney stones    Hypertension    Hypoglycemia    Hypoglycemia    Iron deficiency anemia 07/11/2016   Neuropathy    both arms   Normocytic anemia 07/29/2015   Pancreatitis    Pneumonia    PONV (postoperative nausea and vomiting)    Postoperative anemia due to acute blood loss 11/15/2015   R lung cancer    Sciatica    Seizures ([place])    as a child when she would have an asthma attack. none as an adult   Shortness of breath dyspnea     Current Outpatient Medications:    albuterol (VENTOLIN HFA) 108 (90 Base) MCG/ACT inhaler, Inhale into the lungs every 6 (six) hours as needed for wheezing or shortness of breath., Disp: , Rfl:    amLODipine (NORVASC) 10 MG tablet, Take 1 tablet (10 mg total) by mouth daily., Disp: 90 tablet, Rfl: 3   atorvastatin (LIPITOR) 40 MG  tablet, Take 1 tablet (40 mg total) by mouth daily., Disp: 90 tablet, Rfl: 3   budesonide-formoterol (SYMBICORT) 160-4.5 MCG/ACT inhaler, USE 2 INHALATIONS TWICE A DAY, Disp: 20.4 g, Rfl: 2   Cholecalciferol 125 MCG (5000 UT) capsule, Take 5,000 Units by mouth daily., Disp: , Rfl:    dextromethorphan (DELSYM) 30 MG/5ML liquid, Take 5 mLs (30 mg total) by mouth every 8 (eight) hours as needed for cough., Disp: 148 mL, Rfl: 0   diclofenac sodium (VOLTAREN) 1 % GEL, Apply 1 application topically daily as needed for muscle pain., Disp: , Rfl:    DULoxetine (CYMBALTA) 30 MG capsule, Take 3 capsules (90 mg total) by mouth daily., Disp: 270 capsule, Rfl: 4   ipratropium-albuterol (DUONEB) 0.5-2.5 (3) MG/3ML SOLN, Inhale 3 mLs into the lungs 4 (four) times  daily as needed (for shortness of breath/wheezing). *May use as needed, Disp: , Rfl:    Iron Polysacch Cmplx-B12-FA (POLY-IRON 150 FORTE) 150-0.025-1 MG CAPS, Take 1 capsule by mouth 2 (two) times daily., Disp: 60 capsule, Rfl: 2   L-Methylfolate-Algae-B12-B6 (METANX) 3-90.314-2-35 MG CAPS, TAKE 1 TABLET BY MOUTH TWO TIMES A DAY (Patient taking differently: Take 1 tablet by mouth in the morning and at bedtime.), Disp: 180 capsule, Rfl: 4   magnesium oxide (MAG-OX) 400 (241.3 Mg) MG tablet, Take 400 mg by mouth 2 (two) times daily., Disp: , Rfl:    nicotine (NICODERM CQ) 14 mg/24hr patch, Place 1 patch (14 mg total) onto the skin daily., Disp: 30 patch, Rfl: 0   Nutritional Supplements (ENSURE HIGH PROTEIN) LIQD, Drink 1 can 3 times daily., Disp: 21330 mL, Rfl: PRN   omeprazole (PRILOSEC) 40 MG capsule, Take 1 capsule (40 mg total) by mouth 2 (two) times daily. Take 1 capsule 30 mins before breakfast and evening meal., Disp: 90 capsule, Rfl: 3   ondansetron (ZOFRAN ODT) 4 MG disintegrating tablet, Take 1 tablet (4 mg total) by mouth every 8 (eight) hours as needed for nausea or vomiting., Disp: 20 tablet, Rfl: 0   oxyCODONE-acetaminophen (PERCOCET) 10-325 MG tablet, Take 1 tablet by mouth 5 (five) times daily., Disp: , Rfl:    OXYCONTIN 15 MG 12 hr tablet, Take 15 mg by mouth every 12 (twelve) hours., Disp: , Rfl:    OXYGEN, Inhale 3 L into the lungs See admin instructions. continuous, Disp: , Rfl:    SPIRIVA RESPIMAT 2.5 MCG/ACT AERS, USE 2 INHALATIONS DAILY (Patient taking differently: Inhale 2 puffs into the lungs daily.), Disp: 12 g, Rfl: 3   tizanidine (ZANAFLEX) 6 MG capsule, Take 6 mg by mouth 3 (three) times daily., Disp: , Rfl:    traZODone (DESYREL) 150 MG tablet, TAKE 1 TO 2 TABLETS AT BEDTIME (Patient taking differently: Take 150-300 mg by mouth at bedtime.), Disp: 180 tablet, Rfl: 3 Social History   Socioeconomic History   Marital status: Married    Spouse name: Jasmine [name]   Number of  children: 1   Years of education: Not on file   Highest education level: Some college, no degree  Occupational History   Not on file  Tobacco Use   Smoking status: Every Day    Packs/day: 0.25    Years: 35.00    Pack years: 8.75    Types: Cigarettes   Smokeless tobacco: Never   Tobacco comments:    trying to quit  Vaping Use   Vaping Use: Never used  Substance and Sexual Activity   Alcohol use: Yes    Alcohol/week: 2.0 standard drinks  Types: 2 Glasses of wine per week    Comment: occasionally   Drug use: No   Sexual activity: Yes    Birth control/protection: None  Other Topics Concern   Not on file  Social History Narrative   Retired, lives at home with husband Jasmine [name] and 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 1 son, deceased. 2 pet dogs. Enjoys watching TV.   Social Determinants of Health   Financial Resource Strain: Not on file  Food Insecurity: Not on file  Transportation Needs: Not on file  Physical Activity: Not on file  Stress: Not on file  Social Connections: Not on file  Intimate Partner Violence: Not At Risk   Fear of Current or Ex-Partner: No   Emotionally Abused: No   Physically Abused: No   Sexually Abused: No   Family History  Problem Relation Age of Onset   Heart disease Mother    Diabetes Mother    Hypertension Mother    Hyperlipidemia Mother    COPD Mother        smoked   Cancer Mother        bile duct   COPD Father    Diabetes Father    Heart disease Father    Cancer Father        throat   Dementia Father    Asthma Son    Hypertension Sister    Hyperlipidemia Sister    Hypertension Brother    Hyperlipidemia Brother    Hypertension Brother    Hyperlipidemia Brother    Emphysema Maternal Grandmother        smoked   Colon cancer Neg Hx    Colon polyps Neg Hx     Objective: Office vital signs reviewed. BP 108/65   Pulse 93   Temp 98.4 F (36.9 Jasmine) (Temporal)   Ht 5' (1.524 m)   Wt 93 lb 3.2 oz (42.3 kg)   BMI 18.20 kg/m   Physical Examination:   General: Awake, alert, malnourished, No acute distress HEENT: No scleral icterus Cardio: regular rate and rhythm, S1S2 heard, no murmurs appreciated Pulm: clear to auscultation bilaterally, no wheezes, rhonchi or rales; normal work of breathing on supplemental oxygen GI: soft, flat, mild epigastric tenderness to palpation.  Nondistended.  No guarding or rebound MSK: Ambulates with use of rolling walker  Assessment/ Plan: 65 y.o. female   Diarrhea, unspecified type - Plan: TSH, T4, Free, CMP14+EGFR, Magnesium, CBC, Cdiff NAA+O+P+Stool Culture, Fecal fat, qualitative, Pancreatic Elastase, Fecal  Protein-calorie malnutrition, severe  Likely postinfectious but we will look for metabolic derangements and bacterial infection.  Stool studies have been ordered.  Home care instructions reviewed.  Reinforced adequate hydration.  We discussed red flag signs and symptoms warranting further evaluation.  She voiced good understanding will follow up as needed or in 4 months as scheduled  No orders of the defined types were placed in this encounter.  No orders of the defined types were placed in this encounter.    Jasmine Norlander, DO [place] [phone]

## 2020-11-06 LAB — CBC
Hematocrit: 32.7 % — ABNORMAL LOW (ref 34.0–46.6)
Hemoglobin: 10.7 g/dL — ABNORMAL LOW (ref 11.1–15.9)
MCH: 30.6 pg (ref 26.6–33.0)
MCHC: 32.7 g/dL (ref 31.5–35.7)
MCV: 93 fL (ref 79–97)
Platelets: 329 10*3/uL (ref 150–450)
RBC: 3.5 x10E6/uL — ABNORMAL LOW (ref 3.77–5.28)
RDW: 12 % (ref 11.7–15.4)
WBC: 7.5 10*3/uL (ref 3.4–10.8)

## 2020-11-06 LAB — CMP14+EGFR
ALT: 10 IU/L (ref 0–32)
AST: 18 IU/L (ref 0–40)
Albumin/Globulin Ratio: 1.7 (ref 1.2–2.2)
Albumin: 3.8 g/dL (ref 3.8–4.8)
Alkaline Phosphatase: 96 IU/L (ref 44–121)
BUN/Creatinine Ratio: 17 (ref 12–28)
BUN: 8 mg/dL (ref 8–27)
Bilirubin Total: <0.2 mg/dL (ref 0.0–1.2)
CO2: 20 mmol/L (ref 20–29)
Calcium: 9.3 mg/dL (ref 8.7–10.3)
Chloride: 103 mmol/L (ref 96–106)
Creatinine, Ser: 0.46 mg/dL — ABNORMAL LOW (ref 0.57–1.00)
Globulin, Total: 2.3 g/dL (ref 1.5–4.5)
Glucose: 72 mg/dL (ref 65–99)
Potassium: 3.6 mmol/L (ref 3.5–5.2)
Sodium: 140 mmol/L (ref 134–144)
Total Protein: 6.1 g/dL (ref 6.0–8.5)
eGFR: 105 mL/min/{1.73_m2} (ref >59)

## 2020-11-06 LAB — T4, FREE: Free T4: 0.91 ng/dL (ref 0.82–1.77)

## 2020-11-06 LAB — TSH: TSH: 1.49 u[IU]/mL (ref 0.450–4.500)

## 2020-11-06 LAB — MAGNESIUM: Magnesium: 1.7 mg/dL (ref 1.6–2.3)

## 2020-11-10 ENCOUNTER — Other Ambulatory Visit: Payer: Self-pay

## 2020-11-10 ENCOUNTER — Other Ambulatory Visit: Payer: Medicare Other

## 2020-11-12 ENCOUNTER — Other Ambulatory Visit: Payer: Self-pay | Admitting: Family Medicine

## 2020-11-12 ENCOUNTER — Encounter: Payer: Self-pay | Admitting: Family Medicine

## 2020-11-12 DIAGNOSIS — A0472 Enterocolitis due to Clostridium difficile, not specified as recurrent: Secondary | ICD-10-CM

## 2020-11-12 MED ORDER — VANCOMYCIN HCL 125 MG PO CAPS
125.0000 mg | ORAL_CAPSULE | Freq: Four times a day (QID) | ORAL | 0 refills | Status: DC
Start: 2020-11-12 — End: 2020-11-22

## 2020-11-12 NOTE — Telephone Encounter (Signed)
Dr. Darnell Level,  You mentioned pt trying Xifaxan if stool studies were normal. Do you want to send in?

## 2020-11-14 LAB — PANCREATIC ELASTASE, FECAL: Pancreatic Elastase, Fecal: <50 ug Elast./g — ABNORMAL LOW (ref >200)

## 2020-11-14 LAB — FECAL FAT, QUALITATIVE
Fat Qual Neutral, Stl: NORMAL
Fat Qual Total, Stl: NORMAL

## 2020-11-16 NOTE — Progress Notes (Signed)
R/C about labs

## 2020-11-18 ENCOUNTER — Encounter: Payer: Self-pay | Admitting: Internal Medicine

## 2020-11-22 ENCOUNTER — Encounter: Payer: Self-pay | Admitting: Family Medicine

## 2020-11-22 ENCOUNTER — Other Ambulatory Visit: Payer: Self-pay | Admitting: Family Medicine

## 2020-11-22 DIAGNOSIS — A0472 Enterocolitis due to Clostridium difficile, not specified as recurrent: Secondary | ICD-10-CM

## 2020-11-22 MED ORDER — VANCOMYCIN HCL 125 MG PO CAPS: 125.0000 mg | ORAL_CAPSULE | Freq: Four times a day (QID) | ORAL | 0 refills | Status: DC

## 2020-11-23 LAB — CDIFF NAA+O+P+STOOL CULTURE
E coli, Shiga toxin Assay: NEGATIVE
Toxigenic C. Difficile by PCR: POSITIVE — AB

## 2020-11-24 ENCOUNTER — Other Ambulatory Visit: Payer: Self-pay | Admitting: Family Medicine

## 2020-11-24 DIAGNOSIS — J42 Unspecified chronic bronchitis: Secondary | ICD-10-CM

## 2020-11-25 ENCOUNTER — Other Ambulatory Visit: Payer: Self-pay

## 2020-11-25 ENCOUNTER — Observation Stay (HOSPITAL_COMMUNITY)
Admission: EM | Admit: 2020-11-25 | Discharge: 2020-11-28 | Disposition: A | Discharge status: Home / Self Care | Payer: Medicare Other | Attending: Emergency Medicine | Admitting: Emergency Medicine

## 2020-11-25 ENCOUNTER — Emergency Department (HOSPITAL_COMMUNITY): Payer: Medicare Other

## 2020-11-25 ENCOUNTER — Encounter (HOSPITAL_COMMUNITY): Payer: Self-pay | Admitting: *Deleted

## 2020-11-25 DIAGNOSIS — Z85118 Personal history of other malignant neoplasm of bronchus and lung: Secondary | ICD-10-CM | POA: Diagnosis not present

## 2020-11-25 DIAGNOSIS — Z20822 Contact with and (suspected) exposure to covid-19: Secondary | ICD-10-CM | POA: Insufficient documentation

## 2020-11-25 DIAGNOSIS — Z8619 Personal history of other infectious and parasitic diseases: Secondary | ICD-10-CM

## 2020-11-25 DIAGNOSIS — K219 Gastro-esophageal reflux disease without esophagitis: Secondary | ICD-10-CM | POA: Diagnosis present

## 2020-11-25 DIAGNOSIS — F17210 Nicotine dependence, cigarettes, uncomplicated: Secondary | ICD-10-CM | POA: Insufficient documentation

## 2020-11-25 DIAGNOSIS — R718 Other abnormality of red blood cells: Secondary | ICD-10-CM

## 2020-11-25 DIAGNOSIS — R112 Nausea with vomiting, unspecified: Secondary | ICD-10-CM | POA: Insufficient documentation

## 2020-11-25 DIAGNOSIS — I10 Essential (primary) hypertension: Secondary | ICD-10-CM | POA: Diagnosis not present

## 2020-11-25 DIAGNOSIS — J449 Chronic obstructive pulmonary disease, unspecified: Secondary | ICD-10-CM | POA: Insufficient documentation

## 2020-11-25 DIAGNOSIS — R0789 Other chest pain: Secondary | ICD-10-CM | POA: Diagnosis present

## 2020-11-25 DIAGNOSIS — Z720 Tobacco use: Secondary | ICD-10-CM | POA: Diagnosis present

## 2020-11-25 DIAGNOSIS — Z79899 Other long term (current) drug therapy: Secondary | ICD-10-CM | POA: Diagnosis not present

## 2020-11-25 DIAGNOSIS — J45909 Unspecified asthma, uncomplicated: Secondary | ICD-10-CM | POA: Insufficient documentation

## 2020-11-25 DIAGNOSIS — R079 Chest pain, unspecified: Secondary | ICD-10-CM

## 2020-11-25 LAB — CBC
HCT: 41.1 % (ref 36.0–46.0)
Hemoglobin: 12.6 g/dL (ref 12.0–15.0)
MCH: 31.5 pg (ref 26.0–34.0)
MCHC: 30.7 g/dL (ref 30.0–36.0)
MCV: 102.8 fL — ABNORMAL HIGH (ref 80.0–100.0)
Platelets: 270 10*3/uL (ref 150–400)
RBC: 4 MIL/uL (ref 3.87–5.11)
RDW: 15.3 % (ref 11.5–15.5)
WBC: 6.9 10*3/uL (ref 4.0–10.5)
nRBC: 0 % (ref 0.0–0.2)

## 2020-11-25 LAB — BASIC METABOLIC PANEL
Anion gap: 11 (ref 5–15)
BUN: 8 mg/dL (ref 8–23)
CO2: 28 mmol/L (ref 22–32)
Calcium: 9.1 mg/dL (ref 8.9–10.3)
Chloride: 97 mmol/L — ABNORMAL LOW (ref 98–111)
Creatinine, Ser: 0.48 mg/dL (ref 0.44–1.00)
GFR, Estimated: >60 mL/min (ref >60)
Glucose, Bld: 99 mg/dL (ref 70–99)
Potassium: 4.7 mmol/L (ref 3.5–5.1)
Sodium: 136 mmol/L (ref 135–145)

## 2020-11-25 LAB — TROPONIN I (HIGH SENSITIVITY): Troponin I (High Sensitivity): 10 ng/L (ref <18)

## 2020-11-25 MED ORDER — SPIRONOLACTONE 25 MG PO TABS: 25.0000 mg | ORAL_TABLET | Freq: Every day | ORAL | 0 refills | Status: DC

## 2020-11-25 MED ORDER — ONDANSETRON HCL 4 MG/2ML IJ SOLN
4.0000 mg | Freq: Once | INTRAMUSCULAR | Status: AC
  Administered 2020-11-25: 4 mg via INTRAVENOUS
  Filled 2020-11-25: qty 2

## 2020-11-25 MED ORDER — OXYCODONE-ACETAMINOPHEN 5-325 MG PO TABS
2.0000 | ORAL_TABLET | Freq: Once | ORAL | Status: AC
  Administered 2020-11-25: 2 via ORAL
  Filled 2020-11-25: qty 2

## 2020-11-25 MED ORDER — LACTATED RINGERS IV BOLUS
500.0000 mL | Freq: Once | INTRAVENOUS | Status: AC
  Administered 2020-11-25: 500 mL via INTRAVENOUS

## 2020-11-25 NOTE — ED Triage Notes (Signed)
Pt with hx of COPD and lung CA with mid sternal cp today, took 324 mg ASA and 10mg  oxy at home.  Pt with c/o N/V/D with hx of C-diff. 4mg  zofran and 1 SL NTG given en route and 500 cc bolus. 20 G to rt FA

## 2020-11-25 NOTE — ED Provider Notes (Signed)
Medical screening examination/treatment/procedure(s) were conducted as a shared visit with non-physician practitioner(s) and myself.  I personally evaluated the patient during the encounter.  Clinical Impression:   Final diagnoses:  None    This patient is a 65 year old female history of COPD but no prior cardiac history presenting with increasing chest pain over the course of the day, it is in the center of the chest into the left parasternal area as well as between her shoulder blades.  She does have a cough more than usual, her lungs are diminished with some expiratory wheezing but she is in no distress with her respiratory function.  Heart rate is between 101 115 and a sinus tachycardia, no edema, soft abdomen, she does appear somewhat uncomfortable.  EKG was nondiagnostic, no obvious acute ischemia.  She is hypertensive at 160/104, she will need a CT scan to look for dissection.  Second troponin pending, discussed with Dr. Stark Jock at the change of shift, he will follow-up results and disposition accordingly.   EKG Interpretation  Date/Time:  Thursday November 25 2020 21:23:56 EDT Ventricular Rate:  98 PR Interval:  171 QRS Duration: 78 QT Interval:  347 QTC Calculation: 443 R Axis:   43 Text Interpretation: Sinus rhythm LAE, consider biatrial enlargement Confirmed by Noemi Chapel [phone]) on 11/25/2020 9:29:09 PM          Noemi Chapel, MD 11/30/20 1302

## 2020-11-25 NOTE — ED Provider Notes (Signed)
[facility] EMERGENCY DEPARTMENT Provider Note   CSN: [id number] Arrival date & time: 11/25/20  2108     History Chief Complaint  Patient presents with   Chest Pain    Jasmine [name] is a 65 y.o. female history of COPD, lung cancer, hypertension, hyperlipidemia presents to the ED via EMS for evaluation of chest pain, nausea, vomiting since noon today.  She reports the pain is throbbing and radiates to her back.  She is also experiencing pleuritic chest pain with inspiration.  The patient denies any change in shortness of breath at baseline, patient wears at home oxygen.  She reports abdominal pain only with vomiting.  The patient reports some diarrhea, second series of antibiotics for C. difficile treatment.  She denies any diaphoresis, headache, weakness, dizziness, or blurry vision.  The patient took 324 mg of ASA and 10 mg of oxycodone prior to EMS.  EMS gave patient 4 mg of Zofran, 1 sublingual nitrogen, and 500 cc bolus.   Chest Pain Associated symptoms: abdominal pain, cough, nausea, palpitations, shortness of breath and vomiting   Associated symptoms: no back pain, no fever and no weakness       Past Medical History:  Diagnosis Date   Allergy    Anemia    Anxiety    Arthritis    Asthma    Carpal tunnel syndrome    Chronic back pain    Cigarette smoker 9/37/1696   Complication of anesthesia    in the past has had N/V, the last few surgeries she has not been   COPD (chronic obstructive pulmonary disease) ([place])    Easy bruising 07/10/2016   GERD (gastroesophageal reflux disease)    Headache    Heart murmur 2005   never had any problems   History of blood transfusion 2018   History of kidney stones    Hypertension    Hypoglycemia    Hypoglycemia    Iron deficiency anemia 07/11/2016   Neuropathy    both arms   Normocytic anemia 07/29/2015   Pancreatitis    Pneumonia    PONV (postoperative nausea and vomiting)    Postoperative anemia due to acute blood loss  11/15/2015   R lung cancer    Sciatica    Seizures ([place])    as a child when she would have an asthma attack. none as an adult   Shortness of breath dyspnea     Patient Active Problem List   Diagnosis Date Noted   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[place]) 10/11/2020   Gastroenteritis 10/11/2020   Acute gastroenteritis 10/10/2020   Protein-calorie malnutrition, severe 08/26/2020   Chest pain 08/25/2020   Pleuritic chest pain 08/25/2020   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   GERD (gastroesophageal reflux disease) 04/22/2020   Abnormal CT of the abdomen 04/22/2020   Preventative health care 04/22/2020   Solitary pulmonary nodule 03/22/2020   Pulmonary infiltrate on chest x-ray 02/24/2020   Nocturnal cough 02/19/2020   Pancreatitis, acute 01/06/2019   Acute pancreatitis 01/05/2019   Cigarette smoker 08/25/2017   Chronic respiratory failure with hypoxia ([place]) 08/25/2017   Centrilobular emphysema ([place]) 07/09/2017   Aortic atherosclerosis ([place]) 07/09/2017   Hepatic steatosis 07/09/2017   Malnutrition of moderate degree 07/02/2017   Acute respiratory failure with hypoxia ([place]) 06/30/2017   COPD with acute exacerbation ([place]) 06/30/2017   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/2016   Tobacco abuse 08/04/2016   Iron deficiency anemia 07/11/2016   Easy bruising 07/10/2016   Lumbar stenosis 11/11/2015  Leg pain 09/24/2015   Vitamin D deficiency 07/29/2015   Chronic back pain 02/22/2015   HTN (hypertension) 02/22/2015   Chronic narcotic use 02/22/2015   COPD GOLD II  02/22/2015   Headache 02/22/2015    Past Surgical History:  Procedure Laterality Date   ABDOMINAL HYSTERECTOMY  2947   APPLICATION OF ROBOTIC ASSISTANCE FOR SPINAL PROCEDURE N/A 09/05/2016   Procedure: APPLICATION OF ROBOTIC ASSISTANCE FOR SPINAL PROCEDURE;  Surgeon: Consuella Lose, MD;  Location: [place];  Service: Neurosurgery;  Laterality: N/A;   BACK SURGERY     BIOPSY  06/14/2020   Procedure: BIOPSY;  Surgeon: Daneil Dolin,  MD;  [facility];  Service: Endoscopy;;   [place]   COLONOSCOPY     unsure last   COLONOSCOPY WITH PROPOFOL N/A 06/14/2020   Procedure: COLONOSCOPY WITH PROPOFOL;  Surgeon: Daneil Dolin, MD;  [facility];  Service: Endoscopy;  Laterality: N/A;  PM   cyst removal face     disk repair     neck and lumbar region   ESOPHAGOGASTRODUODENOSCOPY (EGD) WITH PROPOFOL N/A 06/14/2020   Procedure: ESOPHAGOGASTRODUODENOSCOPY (EGD) WITH PROPOFOL;  Surgeon: Daneil Dolin, MD;  [facility];  Service: Endoscopy;  Laterality: N/A;   FOOT SURGERY Bilateral    to file bones down    HARDWARE REMOVAL N/A 09/05/2016   Procedure: HARDWARE REMOVAL AND REPLACEMENT OF LUMBAR FIVE SCREWS;  Surgeon: Consuella Lose, MD;  Location: [place];  Service: Neurosurgery;  Laterality: N/A;   IMPLANTATION / PLACEMENT EPIDURAL NEUROSTIMULATOR ELECTRODES     INTERCOSTAL NERVE BLOCK Right 07/29/2020   Procedure: INTERCOSTAL NERVE BLOCK;  Surgeon: Melrose Nakayama, MD;  Location: [place];  Service: Thoracic;  Laterality: Right;   LAMINECTOMY     2006   lobectomy Right 07/2020   LUMBAR FUSION     LYMPH NODE BIOPSY Right 07/29/2020   Procedure: LYMPH NODE BIOPSY;  Surgeon: Melrose Nakayama, MD;  Location: [place];  Service: Thoracic;  Laterality: Right;   POLYPECTOMY  06/14/2020   Procedure: POLYPECTOMY INTESTINAL;  Surgeon: Daneil Dolin, MD;  [facility];  Service: Endoscopy;;   SPINAL CORD STIMULATOR BATTERY EXCHANGE     SPINAL CORD STIMULATOR INSERTION N/A 07/05/2016   Procedure: REPLACEMENT OF LUMBAR SPINAL CORD STIMULATOR BATTERY;  Surgeon: Newman Pies, MD;  Location: [place];  Service: Neurosurgery;  Laterality: N/A;   TONSILLECTOMY       OB History   No obstetric history on file.     Family History  Problem Relation Age of Onset   Heart disease Mother    Diabetes Mother    Hypertension Mother    Hyperlipidemia  Mother    COPD Mother        smoked   Cancer Mother        bile duct   COPD Father    Diabetes Father    Heart disease Father    Cancer Father        throat   Dementia Father    Asthma Son    Hypertension Sister    Hyperlipidemia Sister    Hypertension Brother    Hyperlipidemia Brother    Hypertension Brother    Hyperlipidemia Brother    Emphysema Maternal Grandmother        smoked   Colon cancer Neg Hx    Colon polyps Neg Hx     Social History  Tobacco Use   Smoking status: Every Day    Packs/day: 0.25    Years: 35.00    Pack years: 8.75    Types: Cigarettes   Smokeless tobacco: Never   Tobacco comments:    trying to quit  Vaping Use   Vaping Use: Never used  Substance Use Topics   Alcohol use: Yes    Alcohol/week: 2.0 standard drinks    Types: 2 Glasses of wine per week    Comment: occasionally   Drug use: No    Home Medications Prior to Admission medications   Medication Sig Start Date End Date Taking? Authorizing Provider  albuterol (VENTOLIN HFA) 108 (90 Base) MCG/ACT inhaler Inhale into the lungs every 6 (six) hours as needed for wheezing or shortness of breath.    [provider]  amLODipine (NORVASC) 10 MG tablet Take 1 tablet (10 mg total) by mouth daily. 07/09/20   Janora Norlander, DO  atorvastatin (LIPITOR) 40 MG tablet Take 1 tablet (40 mg total) by mouth daily. 07/09/20   Ronnie Doss M, DO  budesonide-formoterol (SYMBICORT) 160-4.5 MCG/ACT inhaler USE 2 INHALATIONS TWICE A DAY 11/25/20   Ronnie Doss M, DO  Cholecalciferol 125 MCG (5000 UT) capsule Take 5,000 Units by mouth daily.    [provider]  dextromethorphan (DELSYM) 30 MG/5ML liquid Take 5 mLs (30 mg total) by mouth every 8 (eight) hours as needed for cough. 08/26/20   Barton Dubois, MD  diclofenac sodium (VOLTAREN) 1 % GEL Apply 1 application topically daily as needed for muscle pain. 06/30/16   [provider]  DULoxetine (CYMBALTA) 30 MG capsule Take  3 capsules (90 mg total) by mouth daily. 07/09/20   Janora Norlander, DO  ipratropium-albuterol (DUONEB) 0.5-2.5 (3) MG/3ML SOLN Inhale 3 mLs into the lungs 4 (four) times daily as needed (for shortness of breath/wheezing). *May use as needed 02/21/16   [provider]  Iron Polysacch Cmplx-B12-FA (POLY-IRON 150 FORTE) 150-0.025-1 MG CAPS Take 1 capsule by mouth 2 (two) times daily. 05/27/20   Derek Jack, MD  L-Methylfolate-Algae-B12-B6 [facility]) 3-90.314-2-35 MG CAPS TAKE 1 TABLET BY MOUTH TWO TIMES A DAY Patient taking differently: Take 1 tablet by mouth in the morning and at bedtime. 09/10/19   Janora Norlander, DO  magnesium oxide (MAG-OX) 400 (241.3 Mg) MG tablet Take 400 mg by mouth 2 (two) times daily. 02/07/20   [provider]  nicotine (NICODERM CQ) 14 mg/24hr patch Place 1 patch (14 mg total) onto the skin daily. 06/23/20   Janora Norlander, DO  Nutritional Supplements (ENSURE HIGH PROTEIN) LIQD Drink 1 can 3 times daily. 08/30/20   Janora Norlander, DO  omeprazole (PRILOSEC) 40 MG capsule Take 1 capsule (40 mg total) by mouth 2 (two) times daily. Take 1 capsule 30 mins before breakfast and evening meal. 04/27/20   Carlis Stable, NP  ondansetron (ZOFRAN ODT) 4 MG disintegrating tablet Take 1 tablet (4 mg total) by mouth every 8 (eight) hours as needed for nausea or vomiting. 09/12/20   Maudie Flakes, MD  oxyCODONE-acetaminophen (PERCOCET) 10-325 MG tablet Take 1 tablet by mouth 5 (five) times daily. 08/27/20   [provider]  OXYCONTIN 15 MG 12 hr tablet Take 15 mg by mouth every 12 (twelve) hours. 08/28/20   [provider]  OXYGEN Inhale 3 L into the lungs See admin instructions. continuous    [provider]  SPIRIVA RESPIMAT 2.5 MCG/ACT AERS USE 2 INHALATIONS DAILY Patient taking differently:  Inhale 2 puffs into the lungs daily. 07/13/20   Tanda Rockers, MD  tizanidine (ZANAFLEX) 6 MG capsule Take 6 mg by mouth 3 (three) times  daily. 08/27/20   [provider]  traZODone (DESYREL) 150 MG tablet TAKE 1 TO 2 TABLETS AT BEDTIME Patient taking differently: Take 150-300 mg by mouth at bedtime. 02/02/20   Janora Norlander, DO  vancomycin (VANCOCIN) 125 MG capsule Take 1 capsule (125 mg total) by mouth 4 (four) times daily for 10 days. 11/22/20 12/02/20  Janora Norlander, DO    Allergies    Lyrica [pregabalin], Darvon [propoxyphene], Gabapentin, Augmentin [amoxicillin-pot clavulanate], Erythromycin, and Vibramycin [doxycycline calcium]  Review of Systems   Review of Systems  Constitutional:  Negative for chills and fever.  HENT:  Negative for ear pain and sore throat.   Eyes:  Negative for pain and visual disturbance.  Respiratory:  Positive for cough and shortness of breath.   Cardiovascular:  Positive for chest pain and palpitations.  Gastrointestinal:  Positive for abdominal pain, nausea and vomiting. Negative for blood in stool.  Genitourinary:  Negative for dysuria and hematuria.  Musculoskeletal:  Negative for arthralgias and back pain.  Skin:  Negative for color change and rash.  Neurological:  Negative for seizures, syncope and weakness.  All other systems reviewed and are negative.  Physical Exam Updated Vital Signs BP (!) 167/95   Pulse 97   Temp 98.2 F (36.8 C) (Oral)   Resp 15   Ht 5' (1.524 m)   Wt 39.9 kg   SpO2 100%   BMI 17.19 kg/m   Physical Exam Vitals and nursing note reviewed.  Constitutional:      General: She is not in acute distress.    Appearance: Normal appearance. She is not toxic-appearing.     Comments: The patient appears uncomfortable  HENT:     Head: Normocephalic and atraumatic.  Eyes:     General: No scleral icterus. Cardiovascular:     Rate and Rhythm: Regular rhythm. Tachycardia present.     Comments: Radial pulses 2+. Pulmonary:     Effort: Pulmonary effort is normal. No respiratory distress.     Breath sounds: Decreased breath sounds present.      Comments: Diminished breath sounds throughout.  The patient is on oxygen at baseline.  No respiratory distress.  No cyanosis, nasal flaring, or accessory muscle use. Chest:     Chest wall: No deformity or tenderness.  Abdominal:     General: Abdomen is flat. Bowel sounds are normal.     Palpations: Abdomen is soft.     Tenderness: There is no abdominal tenderness. There is no guarding or rebound.  Musculoskeletal:        General: No deformity.     Cervical back: Normal range of motion.     Comments: Patient has right dropfoot at baseline for patient.  Grip strength strong and equal bilaterally.  Skin:    General: Skin is warm and dry.  Neurological:     Mental Status: She is alert and oriented to person, place, and time. Mental status is at baseline.     Motor: No weakness.     Comments: Sensation intact.  The patient reports she has right foot drop at baseline from prior back surgery.    ED Results / Procedures / Treatments   Labs (all labs ordered are listed, but only abnormal results are displayed) Labs Reviewed  BASIC METABOLIC PANEL - Abnormal; Notable for the following components:  Result Value   Chloride 97 (*)    All other components within normal limits  CBC - Abnormal; Notable for the following components:   MCV 102.8 (*)    All other components within normal limits  TROPONIN I (HIGH SENSITIVITY)  TROPONIN I (HIGH SENSITIVITY)    EKG EKG Interpretation  Date/Time:  Thursday November 25 2020 21:23:56 EDT Ventricular Rate:  98 PR Interval:  171 QRS Duration: 78 QT Interval:  347 QTC Calculation: 443 R Axis:   43 Text Interpretation: Sinus rhythm LAE, consider biatrial enlargement Confirmed by Noemi Chapel [phone]) on 11/25/2020 9:29:09 PM  Radiology DG Chest 2 View  Result Date: 11/25/2020 CLINICAL DATA:  Chest pain.  History of COPD and lung cancer. EXAM: CHEST - 2 VIEW COMPARISON:  Chest x-ray 09/11/2020.  CT chest 08/25/2020. FINDINGS: Surgical staple  lines are again noted in the right suprahilar region. There is stable blunting of the right costophrenic angle. There is no focal lung infiltrate. There is no evidence for pneumothorax. The cardiomediastinal silhouette is within normal limits. Thoracic spinal stimulator device is again noted. Lumbar fusion hardware is partially visualized. IMPRESSION: 1. No acute cardiopulmonary process. 2. Stable chronic right pleural scarring/loculated effusion. Electronically Signed   By: Ronney Asters M.D.   On: 11/25/2020 23:28    Procedures Procedures   Medications Ordered in ED Medications  morphine 4 MG/ML injection 4 mg (has no administration in time range)  ondansetron (ZOFRAN) injection 4 mg (4 mg Intravenous Given 11/25/20 2302)  oxyCODONE-acetaminophen (PERCOCET/ROXICET) 5-325 MG per tablet 2 tablet (2 tablets Oral Given 11/25/20 2303)  lactated ringers bolus 500 mL (500 mLs Intravenous New Bag/Given 11/25/20 2301)    ED Course  I have reviewed the triage vital signs and the nursing notes.  Pertinent labs & imaging results that were available during my care of the patient were reviewed by me and considered in my medical decision making (see chart for details).  Jasmine [name] is a 65 year old female with history of COPD, lung cancer, hypertension, hyperlipidemia presents to the ED via EMS for evaluation of chest pain, nausea, vomiting since noon today.  Diagnosis includes but is not limited to aortic dissection, aortic aneurysm, ACS, malignant pain, GERD, gastritis.  Based on patient's medical history and physical exam, a cardiac work-up will be initiated.  Initial troponin of 10.  CBC shows no leukocytosis or anemia.  CMP negative.  Chest x-ray shows stable chronic right pleural scarring/loculated effusion, but no acute cardiopulmonary process.  EKG shows normal sinus rhythm with possible biatrial enlargement.  No ST elevation.  Pending CT angio.  Reports she is still in pain, at home Percocet  ordered with Zofran.  500 mL LR bolus given.  12:31 AM Care of TYLAN BRIGUGLIO transferred to Dr. Stark Jock at the end of my shift as the patient will require reassessment once labs/imaging have resulted. Patient presentation, ED course, and plan of care discussed with review of all pertinent labs and imaging. Please see his/her note for further details regarding further ED course and disposition. Disposition to be determined by oncoming provider.   MDM Rules/Calculators/A&P                          Final Clinical Impression(s) / ED Diagnoses Final diagnoses:  None    Rx / DC Orders ED Discharge Orders          Ordered    spironolactone (ALDACTONE) 25 MG tablet  Daily,  Status:  Discontinued        11/25/20 2326             Sherrell Puller, PA-C 11/26/20 0100    Noemi Chapel, MD 11/30/20 1302

## 2020-11-26 ENCOUNTER — Observation Stay (HOSPITAL_BASED_OUTPATIENT_CLINIC_OR_DEPARTMENT_OTHER): Payer: Medicare Other

## 2020-11-26 ENCOUNTER — Other Ambulatory Visit: Payer: Self-pay

## 2020-11-26 ENCOUNTER — Telehealth: Payer: Self-pay | Admitting: Internal Medicine

## 2020-11-26 DIAGNOSIS — K219 Gastro-esophageal reflux disease without esophagitis: Secondary | ICD-10-CM

## 2020-11-26 DIAGNOSIS — J449 Chronic obstructive pulmonary disease, unspecified: Secondary | ICD-10-CM

## 2020-11-26 DIAGNOSIS — R0789 Other chest pain: Secondary | ICD-10-CM | POA: Diagnosis not present

## 2020-11-26 DIAGNOSIS — Z720 Tobacco use: Secondary | ICD-10-CM

## 2020-11-26 DIAGNOSIS — R718 Other abnormality of red blood cells: Secondary | ICD-10-CM

## 2020-11-26 DIAGNOSIS — R079 Chest pain, unspecified: Secondary | ICD-10-CM | POA: Diagnosis not present

## 2020-11-26 DIAGNOSIS — I10 Essential (primary) hypertension: Secondary | ICD-10-CM

## 2020-11-26 DIAGNOSIS — R112 Nausea with vomiting, unspecified: Secondary | ICD-10-CM

## 2020-11-26 DIAGNOSIS — Z8619 Personal history of other infectious and parasitic diseases: Secondary | ICD-10-CM

## 2020-11-26 LAB — ECHOCARDIOGRAM LIMITED
AV Mean grad: 4.2 mmHg
AV Peak grad: 7.7 mmHg
Ao pk vel: 1.38 m/s
Calc EF: 63.5 %
Height: 60 in
S' Lateral: 3.8 cm
Single Plane A2C EF: 60.6 %
Single Plane A4C EF: 63.6 %
Weight: 1408 oz

## 2020-11-26 LAB — RESP PANEL BY RT-PCR (FLU A&B, COVID) ARPGX2
Influenza A by PCR: NEGATIVE
Influenza B by PCR: NEGATIVE
SARS Coronavirus 2 by RT PCR: NEGATIVE

## 2020-11-26 LAB — TROPONIN I (HIGH SENSITIVITY): Troponin I (High Sensitivity): 9 ng/L (ref <18)

## 2020-11-26 MED ORDER — HYDROMORPHONE HCL 1 MG/ML IJ SOLN
1.0000 mg | Freq: Once | INTRAMUSCULAR | Status: AC
Start: 2020-11-26 — End: 2020-11-26
  Administered 2020-11-26: 1 mg via INTRAVENOUS
  Filled 2020-11-26: qty 1

## 2020-11-26 MED ORDER — IOHEXOL 350 MG/ML SOLN
100.0000 mL | Freq: Once | INTRAVENOUS | Status: AC | PRN
  Administered 2020-11-26: 100 mL via INTRAVENOUS

## 2020-11-26 MED ORDER — AMLODIPINE BESYLATE 5 MG PO TABS
10.0000 mg | ORAL_TABLET | Freq: Every day | ORAL | Status: DC
  Administered 2020-11-26 – 2020-11-28 (×3): 10 mg via ORAL
  Filled 2020-11-26 (×3): qty 2

## 2020-11-26 MED ORDER — HYDROMORPHONE HCL 1 MG/ML IJ SOLN
0.5000 mg | INTRAMUSCULAR | Status: DC | PRN
Start: 2020-11-26 — End: 2020-11-27
  Administered 2020-11-26 – 2020-11-27 (×8): 0.5 mg via INTRAVENOUS
  Filled 2020-11-26 (×8): qty 0.5

## 2020-11-26 MED ORDER — ALBUTEROL SULFATE (2.5 MG/3ML) 0.083% IN NEBU: 2.5000 mg | INHALATION_SOLUTION | Freq: Four times a day (QID) | RESPIRATORY_TRACT | Status: DC | PRN

## 2020-11-26 MED ORDER — KETOROLAC TROMETHAMINE 30 MG/ML IJ SOLN
30.0000 mg | Freq: Once | INTRAMUSCULAR | Status: AC
  Administered 2020-11-26: 30 mg via INTRAVENOUS
  Filled 2020-11-26: qty 1

## 2020-11-26 MED ORDER — ONDANSETRON HCL 4 MG/2ML IJ SOLN
4.0000 mg | Freq: Four times a day (QID) | INTRAMUSCULAR | Status: DC | PRN
  Administered 2020-11-26 – 2020-11-27 (×6): 4 mg via INTRAVENOUS
  Filled 2020-11-26 (×6): qty 2

## 2020-11-26 MED ORDER — MORPHINE SULFATE (PF) 4 MG/ML IV SOLN
4.0000 mg | Freq: Once | INTRAVENOUS | Status: AC
  Administered 2020-11-26: 4 mg via INTRAVENOUS
  Filled 2020-11-26: qty 1

## 2020-11-26 MED ORDER — ONDANSETRON HCL 4 MG/2ML IJ SOLN
4.0000 mg | Freq: Once | INTRAMUSCULAR | Status: AC
  Administered 2020-11-26: 4 mg via INTRAVENOUS
  Filled 2020-11-26: qty 2

## 2020-11-26 MED ORDER — ENSURE ENLIVE PO LIQD
237.0000 mL | Freq: Three times a day (TID) | ORAL | Status: DC
  Administered 2020-11-26 – 2020-11-28 (×4): 237 mL via ORAL

## 2020-11-26 MED ORDER — HYDRALAZINE HCL 20 MG/ML IJ SOLN: 10.0000 mg | Freq: Four times a day (QID) | INTRAMUSCULAR | Status: DC | PRN

## 2020-11-26 MED ORDER — OXYCODONE HCL 5 MG PO TABS
5.0000 mg | ORAL_TABLET | ORAL | Status: DC | PRN
  Administered 2020-11-26 – 2020-11-27 (×6): 5 mg via ORAL
  Filled 2020-11-26 (×6): qty 1

## 2020-11-26 MED ORDER — DM-GUAIFENESIN ER 30-600 MG PO TB12
1.0000 | ORAL_TABLET | Freq: Two times a day (BID) | ORAL | Status: DC
  Administered 2020-11-26 – 2020-11-28 (×5): 1 via ORAL
  Filled 2020-11-26 (×5): qty 1

## 2020-11-26 MED ORDER — NICOTINE 14 MG/24HR TD PT24
14.0000 mg | MEDICATED_PATCH | Freq: Every day | TRANSDERMAL | Status: DC
  Administered 2020-11-26 – 2020-11-28 (×3): 14 mg via TRANSDERMAL
  Filled 2020-11-26 (×3): qty 1

## 2020-11-26 MED ORDER — IPRATROPIUM-ALBUTEROL 0.5-2.5 (3) MG/3ML IN SOLN: 3.0000 mL | Freq: Four times a day (QID) | RESPIRATORY_TRACT | Status: DC | PRN

## 2020-11-26 MED ORDER — ACETAMINOPHEN 325 MG PO TABS: 650.0000 mg | ORAL_TABLET | Freq: Four times a day (QID) | ORAL | Status: DC | PRN

## 2020-11-26 MED ORDER — ALBUTEROL SULFATE HFA 108 (90 BASE) MCG/ACT IN AERS: 2.0000 | INHALATION_SPRAY | Freq: Four times a day (QID) | RESPIRATORY_TRACT | Status: DC | PRN

## 2020-11-26 MED ORDER — MOMETASONE FURO-FORMOTEROL FUM 200-5 MCG/ACT IN AERO
2.0000 | INHALATION_SPRAY | Freq: Two times a day (BID) | RESPIRATORY_TRACT | Status: DC
  Administered 2020-11-26 – 2020-11-28 (×5): 2 via RESPIRATORY_TRACT
  Filled 2020-11-26: qty 8.8

## 2020-11-26 MED ORDER — ENOXAPARIN SODIUM 30 MG/0.3ML IJ SOSY
30.0000 mg | PREFILLED_SYRINGE | INTRAMUSCULAR | Status: DC
  Administered 2020-11-26 – 2020-11-28 (×3): 30 mg via SUBCUTANEOUS
  Filled 2020-11-26 (×3): qty 0.3

## 2020-11-26 MED ORDER — VANCOMYCIN HCL 125 MG PO CAPS
125.0000 mg | ORAL_CAPSULE | Freq: Four times a day (QID) | ORAL | Status: DC
  Administered 2020-11-26 – 2020-11-28 (×9): 125 mg via ORAL
  Filled 2020-11-26 (×14): qty 1

## 2020-11-26 MED ORDER — PANTOPRAZOLE SODIUM 40 MG PO TBEC
40.0000 mg | DELAYED_RELEASE_TABLET | Freq: Every day | ORAL | Status: DC
  Administered 2020-11-26 – 2020-11-28 (×3): 40 mg via ORAL
  Filled 2020-11-26 (×3): qty 1

## 2020-11-26 MED ORDER — ATORVASTATIN CALCIUM 40 MG PO TABS
40.0000 mg | ORAL_TABLET | Freq: Every day | ORAL | Status: DC
  Administered 2020-11-26 – 2020-11-28 (×3): 40 mg via ORAL
  Filled 2020-11-26 (×3): qty 1

## 2020-11-26 NOTE — Progress Notes (Signed)
Patient requested more pain and nausea meds after asking if she could get lunch tray to eat. Had secretary bring her a barf bag. Advised patient not to eat and to let us get ECHO done since her pain and nausea were just medicated early as requested by Dr. Manuella Ghazi. She says she isn't throwing up, just has nausea, but advised again to not eat.

## 2020-11-26 NOTE — H&P (Signed)
History and Physical  RAYLYNNE CUBBAGE DXI:[id number] DOB: 02-28-1956 DOA: 11/25/2020  Referring physician: Veryl Speak MD PCP: Janora Norlander, DO  Patient coming from: Home  Chief Complaint: Chest pain  HPI: LAKRISHA ISEMAN is a 65 y.o. female with medical history significant for  normocytic anemia, history of blood transfusion, anxiety, osteoarthritis, COPD, active light smoker, chronic back pain, easy bruising, GERD, headache, nephrolithiasis, hypertension, hyperglycemia, neuropathy, normocytic anemia, history of pancreatitis, right lung cancer, sciatica and seasonal allergies who presents to the emergency department due to chest pain, nausea and vomiting which started yesterday) 9/22).  Chest pain was in anterior chest and has a reproducible component, the pain was described as throbbing.  She states that she had a CT found that she was on second round of antibiotic treatment.  She denies fever, chills, headache, blurry vision or weakness.  Aspirin 324mg  oxycodone 10 mg taken prior to EMS did not alleviate the pain.  ED Course:  In the emergency department, she was hemodynamically stable, BP on arrival was 172/95.  Work-up in the ED showed normal CBC except for elevated MCV at 102.8 and normal BMP.  Troponin x2- 10 > 9.  Influenza A, B, SARS coronavirus 2 was negative. CT angiography chest abdomen and pelvis showed no acute intrathoracic, abdominal or pelvic pathology.  No aortic dissection or aneurysm Chest x-ray showed no acute cardiopulmonary process She was treated with pain medication including Dilaudid, morphine, Toradol, Percocet.  IV Zofran was given due to nausea.  Hospitalist was asked to admit patient for further evaluation and management.  Review of Systems: Constitutional: Negative for chills and fever.  HENT: Negative for ear pain and sore throat.   Eyes: Negative for pain and visual disturbance.  Respiratory: Positive for cough and shortness of breath.   Cardiovascular:  Positive for chest pain and negative for palpitations.  Gastrointestinal: Positive for abdominal pain, nausea and vomiting.  Endocrine: Negative for polyphagia and polyuria.  Genitourinary: Negative for decreased urine volume, dysuria, enuresis Musculoskeletal: Negative for arthralgias and back pain.  Skin: Negative for color change and rash.  Allergic/Immunologic: Negative for immunocompromised state.  Neurological: Negative for tremors, syncope, speech difficulty Hematological: Does not bruise/bleed easily.  All other systems reviewed and are negative   Past Medical History:  Diagnosis Date   Allergy    Anemia    Anxiety    Arthritis    Asthma    Carpal tunnel syndrome    Chronic back pain    Cigarette smoker 7/67/3419   Complication of anesthesia    in the past has had N/V, the last few surgeries she has not been   COPD (chronic obstructive pulmonary disease) ([place])    Easy bruising 07/10/2016   GERD (gastroesophageal reflux disease)    Headache    Heart murmur 2005   never had any problems   History of blood transfusion 2018   History of kidney stones    Hypertension    Hypoglycemia    Hypoglycemia    Iron deficiency anemia 07/11/2016   Neuropathy    both arms   Normocytic anemia 07/29/2015   Pancreatitis    Pneumonia    PONV (postoperative nausea and vomiting)    Postoperative anemia due to acute blood loss 11/15/2015   R lung cancer    Sciatica    Seizures ([place])    as a child when she would have an asthma attack. none as an adult   Shortness of breath dyspnea    Past  Surgical History:  Procedure Laterality Date   ABDOMINAL HYSTERECTOMY  5638   APPLICATION OF ROBOTIC ASSISTANCE FOR SPINAL PROCEDURE N/A 09/05/2016   Procedure: APPLICATION OF ROBOTIC ASSISTANCE FOR SPINAL PROCEDURE;  Surgeon: Consuella Lose, MD;  Location: [place];  Service: Neurosurgery;  Laterality: N/A;   BACK SURGERY     BIOPSY  06/14/2020   Procedure: BIOPSY;  Surgeon: Daneil Dolin, MD;   [facility];  Service: Endoscopy;;   [place]   COLONOSCOPY     unsure last   COLONOSCOPY WITH PROPOFOL N/A 06/14/2020   Procedure: COLONOSCOPY WITH PROPOFOL;  Surgeon: Daneil Dolin, MD;  [facility];  Service: Endoscopy;  Laterality: N/A;  PM   cyst removal face     disk repair     neck and lumbar region   ESOPHAGOGASTRODUODENOSCOPY (EGD) WITH PROPOFOL N/A 06/14/2020   Procedure: ESOPHAGOGASTRODUODENOSCOPY (EGD) WITH PROPOFOL;  Surgeon: Daneil Dolin, MD;  [facility];  Service: Endoscopy;  Laterality: N/A;   FOOT SURGERY Bilateral    to file bones down    HARDWARE REMOVAL N/A 09/05/2016   Procedure: HARDWARE REMOVAL AND REPLACEMENT OF LUMBAR FIVE SCREWS;  Surgeon: Consuella Lose, MD;  Location: [place];  Service: Neurosurgery;  Laterality: N/A;   IMPLANTATION / PLACEMENT EPIDURAL NEUROSTIMULATOR ELECTRODES     INTERCOSTAL NERVE BLOCK Right 07/29/2020   Procedure: INTERCOSTAL NERVE BLOCK;  Surgeon: Melrose Nakayama, MD;  Location: [place];  Service: Thoracic;  Laterality: Right;   LAMINECTOMY     2006   lobectomy Right 07/2020   LUMBAR FUSION     LYMPH NODE BIOPSY Right 07/29/2020   Procedure: LYMPH NODE BIOPSY;  Surgeon: Melrose Nakayama, MD;  Location: [place];  Service: Thoracic;  Laterality: Right;   POLYPECTOMY  06/14/2020   Procedure: POLYPECTOMY INTESTINAL;  Surgeon: Daneil Dolin, MD;  [facility];  Service: Endoscopy;;   SPINAL CORD STIMULATOR BATTERY EXCHANGE     SPINAL CORD STIMULATOR INSERTION N/A 07/05/2016   Procedure: REPLACEMENT OF LUMBAR SPINAL CORD STIMULATOR BATTERY;  Surgeon: Newman Pies, MD;  Location: [place];  Service: Neurosurgery;  Laterality: N/A;   TONSILLECTOMY      Social History:  reports that she has been smoking cigarettes. She has a 8.75 pack-year smoking history. She has never used smokeless tobacco. She reports current alcohol use of about 2.0  standard drinks per week. She reports that she does not use drugs.   Allergies  Allergen Reactions   Lyrica [Pregabalin] Other (See Comments)    EXTREME SHAKING/TREMBLING   Darvon [Propoxyphene]     UNSPECIFIED REACTION    Gabapentin     Extreme shaking and trembling    Augmentin [Amoxicillin-Pot Clavulanate] Nausea And Vomiting    Has patient had a PCN reaction causing immediate rash, facial/tongue/throat swelling, SOB or lightheadedness with hypotension:no Has patient had a PCN reaction causing severe rash involving mucus membranes or skin necrosis:No Has patient had a PCN reaction that required hospitalization:No Has patient had a PCN reaction occurring within the last 10 years:Yes--ONLY N/V If all of the above answers are "NO", then may proceed with Cephalosporin use.    Erythromycin Itching and Rash        Vibramycin [Doxycycline Calcium] Itching and Rash    "[place] "    Family History  Problem Relation Age of Onset   Heart disease Mother    Diabetes Mother  Hypertension Mother    Hyperlipidemia Mother    COPD Mother        smoked   Cancer Mother        bile duct   COPD Father    Diabetes Father    Heart disease Father    Cancer Father        throat   Dementia Father    Asthma Son    Hypertension Sister    Hyperlipidemia Sister    Hypertension Brother    Hyperlipidemia Brother    Hypertension Brother    Hyperlipidemia Brother    Emphysema Maternal Grandmother        smoked   Colon cancer Neg Hx    Colon polyps Neg Hx      Prior to Admission medications   Medication Sig Start Date End Date Taking? Authorizing Provider  albuterol (VENTOLIN HFA) 108 (90 Base) MCG/ACT inhaler Inhale into the lungs every 6 (six) hours as needed for wheezing or shortness of breath.    [provider]  amLODipine (NORVASC) 10 MG tablet Take 1 tablet (10 mg total) by mouth daily. 07/09/20   Janora Norlander, DO  atorvastatin (LIPITOR) 40 MG tablet Take 1 tablet  (40 mg total) by mouth daily. 07/09/20   Ronnie Doss M, DO  budesonide-formoterol (SYMBICORT) 160-4.5 MCG/ACT inhaler USE 2 INHALATIONS TWICE A DAY 11/25/20   Ronnie Doss M, DO  Cholecalciferol 125 MCG (5000 UT) capsule Take 5,000 Units by mouth daily.    [provider]  dextromethorphan (DELSYM) 30 MG/5ML liquid Take 5 mLs (30 mg total) by mouth every 8 (eight) hours as needed for cough. 08/26/20   Barton Dubois, MD  diclofenac sodium (VOLTAREN) 1 % GEL Apply 1 application topically daily as needed for muscle pain. 06/30/16   [provider]  DULoxetine (CYMBALTA) 30 MG capsule Take 3 capsules (90 mg total) by mouth daily. 07/09/20   Janora Norlander, DO  ipratropium-albuterol (DUONEB) 0.5-2.5 (3) MG/3ML SOLN Inhale 3 mLs into the lungs 4 (four) times daily as needed (for shortness of breath/wheezing). *May use as needed 02/21/16   [provider]  Iron Polysacch Cmplx-B12-FA (POLY-IRON 150 FORTE) 150-0.025-1 MG CAPS Take 1 capsule by mouth 2 (two) times daily. 05/27/20   Derek Jack, MD  L-Methylfolate-Algae-B12-B6 [facility]) 3-90.314-2-35 MG CAPS TAKE 1 TABLET BY MOUTH TWO TIMES A DAY Patient taking differently: Take 1 tablet by mouth in the morning and at bedtime. 09/10/19   Janora Norlander, DO  magnesium oxide (MAG-OX) 400 (241.3 Mg) MG tablet Take 400 mg by mouth 2 (two) times daily. 02/07/20   [provider]  nicotine (NICODERM CQ) 14 mg/24hr patch Place 1 patch (14 mg total) onto the skin daily. 06/23/20   Janora Norlander, DO  Nutritional Supplements (ENSURE HIGH PROTEIN) LIQD Drink 1 can 3 times daily. 08/30/20   Janora Norlander, DO  omeprazole (PRILOSEC) 40 MG capsule Take 1 capsule (40 mg total) by mouth 2 (two) times daily. Take 1 capsule 30 mins before breakfast and evening meal. 04/27/20   Carlis Stable, NP  ondansetron (ZOFRAN ODT) 4 MG disintegrating tablet Take 1 tablet (4 mg total) by mouth every 8 (eight) hours as needed for  nausea or vomiting. 09/12/20   Maudie Flakes, MD  oxyCODONE-acetaminophen (PERCOCET) 10-325 MG tablet Take 1 tablet by mouth 5 (five) times daily. 08/27/20   [provider]  OXYCONTIN 15 MG 12 hr tablet Take 15 mg by mouth every 12 (  twelve) hours. 08/28/20   [provider]  OXYGEN Inhale 3 L into the lungs See admin instructions. continuous    [provider]  SPIRIVA RESPIMAT 2.5 MCG/ACT AERS USE 2 INHALATIONS DAILY Patient taking differently: Inhale 2 puffs into the lungs daily. 07/13/20   Tanda Rockers, MD  tizanidine (ZANAFLEX) 6 MG capsule Take 6 mg by mouth 3 (three) times daily. 08/27/20   [provider]  traZODone (DESYREL) 150 MG tablet TAKE 1 TO 2 TABLETS AT BEDTIME Patient taking differently: Take 150-300 mg by mouth at bedtime. 02/02/20   Janora Norlander, DO  vancomycin (VANCOCIN) 125 MG capsule Take 1 capsule (125 mg total) by mouth 4 (four) times daily for 10 days. 11/22/20 12/02/20  Janora Norlander, DO    Physical Exam: BP (!) 149/80 (BP Location: Left Arm)   Pulse 100   Temp 98.3 F (36.8 C) (Oral)   Resp 17   Ht 5' (1.524 m)   Wt 39.9 kg   SpO2 100%   BMI 17.19 kg/m   General: 65 y.o. year-old female well developed well nourished in no acute distress.  Alert and oriented x3. HEENT: NCAT, EOMI Neck: Supple, trachea medial Cardiovascular: Regular rate and rhythm with no rubs or gallops.  No thyromegaly or JVD noted.  No lower extremity edema. 2/4 pulses in all 4 extremities. Respiratory: Decreased breath sounds with no wheezes or rales.  Abdomen: Soft, nontender nondistended with normal bowel sounds x4 quadrants. Muskuloskeletal: No cyanosis, clubbing or edema noted bilaterally Neuro: CN II-XII intact, strength 5/5 x 4, sensation, reflexes intact Skin: No ulcerative lesions noted or rashes Psychiatry: Mood is appropriate for condition and setting          Labs on Admission:  Basic Metabolic Panel: Recent Labs  Lab  11/25/20 2220  NA 136  K 4.7  CL 97*  CO2 28  GLUCOSE 99  BUN 8  CREATININE 0.48  CALCIUM 9.1   Liver Function Tests: No results for input(s): AST, ALT, ALKPHOS, BILITOT, PROT, ALBUMIN in the last 168 hours. No results for input(s): LIPASE, AMYLASE in the last 168 hours. No results for input(s): AMMONIA in the last 168 hours. CBC: Recent Labs  Lab 11/25/20 2220  WBC 6.9  HGB 12.6  HCT 41.1  MCV 102.8*  PLT 270   Cardiac Enzymes: No results for input(s): CKTOTAL, CKMB, CKMBINDEX, TROPONINI in the last 168 hours.  BNP (last 3 results) No results for input(s): BNP in the last 8760 hours.  ProBNP (last 3 results) No results for input(s): PROBNP in the last 8760 hours.  CBG: No results for input(s): GLUCAP in the last 168 hours.  Radiological Exams on Admission: DG Chest 2 View  Result Date: 11/25/2020 CLINICAL DATA:  Chest pain.  History of COPD and lung cancer. EXAM: CHEST - 2 VIEW COMPARISON:  Chest x-ray 09/11/2020.  CT chest 08/25/2020. FINDINGS: Surgical staple lines are again noted in the right suprahilar region. There is stable blunting of the right costophrenic angle. There is no focal lung infiltrate. There is no evidence for pneumothorax. The cardiomediastinal silhouette is within normal limits. Thoracic spinal stimulator device is again noted. Lumbar fusion hardware is partially visualized. IMPRESSION: 1. No acute cardiopulmonary process. 2. Stable chronic right pleural scarring/loculated effusion. Electronically Signed   By: Ronney Asters M.D.   On: 11/25/2020 23:28   CT Angio Chest/Abd/Pel for Dissection W and/or Wo Contrast  Result Date: 11/26/2020 CLINICAL DATA:  Chest and back pain.  Concern  for aortic dissection. EXAM: CT ANGIOGRAPHY CHEST, ABDOMEN AND PELVIS TECHNIQUE: Non-contrast CT of the chest was initially obtained. Multidetector CT imaging through the chest, abdomen and pelvis was performed using the standard protocol during bolus administration of  intravenous contrast. Multiplanar reconstructed images and MIPs were obtained and reviewed to evaluate the vascular anatomy. CONTRAST:  132mL OMNIPAQUE IOHEXOL 350 MG/ML SOLN COMPARISON:  Chest CT dated 08/25/2020. FINDINGS: Evaluation is limited due to streak artifact caused by spinal hardware. CTA CHEST FINDINGS Cardiovascular: There is no cardiomegaly or pericardial effusion. Mild atherosclerotic calcification of the thoracic aorta. No aneurysmal dilatation or dissection. The origins of the great vessels of the arch appear patent as visualized. No pulmonary artery embolus identified. Mediastinum/Nodes: No hilar or mediastinal adenopathy. The esophagus is grossly unremarkable. No mediastinal fluid collection. Lungs/Pleura: Moderate emphysema. Postsurgical changes of right upper lobe with areas of scarring. Similar appearance of a 14 x 19 mm nodular density anterior to the right hilum, likely scarring. Attention on follow-up imaging recommended. No consolidative changes. Trace right pleural effusion. No pneumothorax. The central airways are patent. Musculoskeletal: Osteopenia with degenerative changes of the spine. No acute osseous pathology. Thoracic spinal stimulator. Review of the MIP images confirms the above findings. CTA ABDOMEN AND PELVIS FINDINGS VASCULAR Aorta: Moderate atherosclerotic calcification of the aorta. No aneurysmal dilatation or dissection. No periaortic fluid collection. Celiac: Atherosclerotic calcification of the origin of the celiac artery. The celiac axis and its major branches are patent. SMA: Patent without evidence of aneurysm, dissection, vasculitis or significant stenosis. Renals: The renal arteries are patent. IMA: Patent without evidence of aneurysm, dissection, vasculitis or significant stenosis. Inflow: Moderate atherosclerotic calcification of the iliac arteries. The iliac arteries remain patent. Veins: The IVC is grossly unremarkable as visualized. No portal venous gas. Review  of the MIP images confirms the above findings. NON-VASCULAR No intra-abdominal free air or free fluid. Hepatobiliary: Fatty liver. There is dilatation of the central intrahepatic biliary trees and common bile duct. The common bile duct measures 16 mm in diameter. There is a 7 mm focus of calcification in the head of the pancreas in the region of the ampulla. Further evaluation with MRCP on a nonemergent/outpatient basis is recommended. The gallbladder is unremarkable. Pancreas: There is dilatation of the main pancreatic duct measuring 6 mm in diameter, likely secondary to the 7 mm ampullary stone. No acute inflammatory changes. Spleen: Normal in size without focal abnormality. Adrenals/Urinary Tract: The adrenal glands unremarkable. There is no hydronephrosis on either side. Small bilateral renal cysts and indeterminate 2 cm hypodense lesion in the inferior pole of the right kidney which is not characterized on this CT. Further evaluation with ultrasound on a nonemergent/outpatient basis recommended. The urinary bladder is distended otherwise unremarkable. Stomach/Bowel: There is no bowel obstruction or active inflammation. The appendix is not visualized with certainty. No inflammatory changes identified in the right lower quadrant. Lymphatic: No adenopathy. Reproductive: Hysterectomy. Other: None Musculoskeletal: Osteopenia with degenerative changes of the spine and posterior fusion hardware. No acute osseous pathology. Review of the MIP images confirms the above findings. IMPRESSION: 1. No acute intrathoracic, abdominal, or pelvic pathology. No aortic dissection or aneurysm. 2. A 7 mm focus of calcification in the region of the ampulla with dilatation of the central intrahepatic biliary trees and main pancreatic duct. Further evaluation with MRCP on a nonemergent/outpatient basis is recommended. 3. Fatty liver. 4. Small bilateral renal cysts and indeterminate 2 cm hypodense lesion in the inferior pole of the right  kidney. Follow-up with ultrasound  on a nonemergent/outpatient basis recommended. 5. Aortic Atherosclerosis (ICD10-I70.0) and Emphysema (ICD10-J43.9). Electronically Signed   By: Anner Crete M.D.   On: 11/26/2020 01:09    EKG: I independently viewed the EKG done and my findings are as followed: Normal sinus rhythm at a rate of 98 bpm  Assessment/Plan Present on Admission:  Atypical chest pain  HTN (hypertension)  COPD GOLD II   Tobacco abuse  GERD (gastroesophageal reflux disease)  Principal Problem:   Atypical chest pain Active Problems:   HTN (hypertension)   COPD GOLD II    Tobacco abuse   GERD (gastroesophageal reflux disease)   Elevated MCV   Nausea & vomiting   History of Clostridioides difficile infection  Atypical chest pain Patient's chest pain appears to be pleuritic in nature Troponin x2 was negative EKG showed normal sinus rhythm Continue Tylenol, oxycodone  Nausea and vomiting Continue Zofran as needed  History of C. difficile Patient states that she has been having diarrhea for about 1 month and that she was diagnosed with C. difficile and currently on second round of antibiotics Continue vancomycin   Essential hypertension (uncontrolled) Continue amlodipine when med rec is updated Continue IV hydralazine 10 mg every 6 hours as needed for SBP > 170  Aortic atherosclerosis  Continue Lipitor  Chronic respiratory failure with hypoxia in the setting of COPD Continue Ventolin, Dulera, DuoNeb, Mucinex when med rec is updated  GERD Continue Protonix  Tobacco abuse Patient counseled on tobacco abuse cessation Continue nicotine patch  DVT prophylaxis: Lovenox  Code Status: Full code  Family Communication: None at bedside  Disposition Plan:  Patient is from:                        home Anticipated DC to:                   SNF or family members home Anticipated DC date:               2-3 days Anticipated DC barriers:          Patient requires  inpatient management of chest pain  Consults called: None  Admission status: Observation    Bernadette Hoit MD [facility]  11/26/2020, 8:09 AM

## 2020-11-26 NOTE — Progress Notes (Signed)
Patient's pain was non relieved by oxycodone. Patient stated that she received toradol and the medicine that starts with a "d" in the Er. Notified Dr. Manuella Ghazi who provided orders for medication. Patient had an incontinent episode and states that some times she does not know she has to go to the bathroom.

## 2020-11-26 NOTE — ED Notes (Signed)
Pt given ice water per request.

## 2020-11-26 NOTE — Progress Notes (Signed)
PROGRESS NOTE    Jasmine [name]  QIH:[id number] DOB: May 12, 1955 DOA: 11/25/2020 PCP: Janora Norlander, DO    Brief Narrative: Kestrel Mis is a 65 year old female who presents with atypical chest pain. She reports sharp throbbing pain in her chest, left side, from front to back, tender to palpation, pain without palpation, pain with breathing. Hx of copd, lung cancer, c-diff currently, htn, hld, anemia, chronic back pain, lumbar fusion, spinal stimulator. Patient reports nausea and vomiting, possibly before and after the chest pain started. Patient denies exertion when the chest pain started, and as it is made worse when she breathes, exertion provokes her chest pain. Her pain radiates front and back of her left chest, more deeply than superficial. Initial chest CTA in the ED did not show any sign of aortic dissection.   Assessment & Plan:   Principal Problem:   Atypical chest pain - serial troponin have not resulted in any notable findings -Chest CTA for aortic dissection was without aneurysmal dilatation or dissection. -no cardiomegaly or effusion noted -no pulmonary artery embolus noted -No pneumothorax -no acute osseous pathology -esophagus is unremarkable, no mediastinal fluid collection  Active Problems:    HTN uncontrolled -most recent 142/79 -home 10mg  amlodipine qd continued -most likely elevated due to current chest discomfort -hydralazine 10mg  prn q6hrs in the event of a systolic in excess of 875IEPP    COPD -duoneb nebulizer prn for shortness of breath -albuterol nebulizer prn for shortness of breath -dulera 200/5 2puff bid -albuterol mdi, 58mcg, 2 puff, q6hr, prn    Tobacco abuse -nicotine patch in use -smoking cessation and emphasize the risks of smoking and home oxygen    Nausea & vomiting -ondansetron 4mg  iv prn q4hrs as needed for continued nausea and vomiting    History of Clostridioides difficile infection -currently she reports completing 1x 10  day course of po vancomycin and reports that she is still experiencing symptoms and diarrhea -current 125mg  vancomycin po qid  Aortic atherosclerosis -continue her atorvastatin 40mg  qd  DVT prophylaxis: lovenox Code Status: full Disposition Plan:    Consultants:  cardiology    Subjective: Patient is uncomfortable and restless  Objective: Vitals:   11/26/20 0430 11/26/20 0500 11/26/20 1116 11/26/20 1302  BP: 137/85 (!) 149/80 (!) 150/82 (!) 142/79  Pulse: 84 100 89 97  Resp: 18 17 16 18   Temp:  98.3 F (36.8 C) 98.2 F (36.8 C) 98.1 F (36.7 C)  TempSrc:  Oral Oral Oral  SpO2: 100% 100% 100% 100%  Weight:      Height:        Intake/Output Summary (Last 24 hours) at 11/26/2020 1359 Last data filed at 11/26/2020 0925 Gross per 24 hour  Intake 980 ml  Output --  Net 980 ml   Filed Weights   11/25/20 2114  Weight: 39.9 kg    Examination:  General exam: patient is supine in bed, in a dark room, responsive, restless, uncomfortable  Respiratory system: Clear to auscultation. Respiratory effort normal.  Cardiovascular system: S1 & S2 heard, RRR. No JVD, murmurs, rubs, gallops or clicks. No pedal edema.  Gastrointestinal system: Abdomen is nondistended, soft and nontender. No organomegaly or masses felt. Normal bowel sounds heard.  Central nervous system: Alert and oriented. No focal neurological deficits.  Extremities: moves all extremities appropriately, no pedal edema  Skin: no obvious rashes or lesions, no rashes or lesions on the chest or back at the site of the pain  Psychiatry: Judgement and insight appear  normal. Mood & affect appropriate.     Data Reviewed: I have personally reviewed following labs and imaging studies  CBC: Recent Labs  Lab 11/25/20 2220  WBC 6.9  HGB 12.6  HCT 41.1  MCV 102.8*  PLT 462   Basic Metabolic Panel: Recent Labs  Lab 11/25/20 2220  NA 136  K 4.7  CL 97*  CO2 28  GLUCOSE 99  BUN 8  CREATININE 0.48   CALCIUM 9.1   GFR: Estimated Creatinine Clearance: 43 mL/min (by C-G formula based on SCr of 0.48 mg/dL). Liver Function Tests: No results for input(s): AST, ALT, ALKPHOS, BILITOT, PROT, ALBUMIN in the last 168 hours. No results for input(s): LIPASE, AMYLASE in the last 168 hours. No results for input(s): AMMONIA in the last 168 hours. Coagulation Profile: No results for input(s): INR, PROTIME in the last 168 hours. Cardiac Enzymes: No results for input(s): CKTOTAL, CKMB, CKMBINDEX, TROPONINI in the last 168 hours.  Urine analysis:    Component Value Date/Time   COLORURINE YELLOW 10/11/2020 0017   APPEARANCEUR CLEAR 10/11/2020 0017   APPEARANCEUR Clear 07/26/2017 1636   LABSPEC 1.011 10/11/2020 0017   PHURINE 6.0 10/11/2020 0017   GLUCOSEU NEGATIVE 10/11/2020 0017   HGBUR NEGATIVE 10/11/2020 0017   BILIRUBINUR NEGATIVE 10/11/2020 0017   BILIRUBINUR Negative 07/26/2017 1636   KETONESUR 80 (A) 10/11/2020 0017   PROTEINUR 100 (A) 10/11/2020 0017   NITRITE NEGATIVE 10/11/2020 0017   LEUKOCYTESUR NEGATIVE 10/11/2020 0017   Sepsis Labs: @LABRCNTIP (procalcitonin:4,lacticidven:4)  ) Recent Results (from the past 240 hour(s))  Resp Panel by RT-PCR (Flu A&B, Covid) Nasopharyngeal Swab     Status: None   Collection Time: 11/26/20  4:35 AM   Specimen: Nasopharyngeal Swab; Nasopharyngeal(NP) swabs in vial transport medium  Result Value Ref Range Status   SARS Coronavirus 2 by RT PCR NEGATIVE NEGATIVE Final    Comment: (NOTE) SARS-CoV-2 target nucleic acids are NOT DETECTED.  The SARS-CoV-2 RNA is generally detectable in upper respiratory specimens during the acute phase of infection. The lowest concentration of SARS-CoV-2 viral copies this assay can detect is 138 copies/mL. A negative result does not preclude SARS-Cov-2 infection and should not be used as the sole basis for treatment or other patient management decisions. A negative result may occur with  improper specimen  collection/handling, submission of specimen other than nasopharyngeal swab, presence of viral mutation(s) within the areas targeted by this assay, and inadequate number of viral copies(<138 copies/mL). A negative result must be combined with clinical observations, patient history, and epidemiological information. The expected result is Negative.  Fact Sheet for Patients:  EntrepreneurPulse.com.au  Fact Sheet for Healthcare Providers:  IncredibleEmployment.be  This test is no t yet approved or cleared by the [place] FDA and  has been authorized for detection and/or diagnosis of SARS-CoV-2 by FDA under an Emergency Use Authorization (EUA). This EUA will remain  in effect (meaning this test can be used) for the duration of the COVID-19 declaration under Section 564(b)(1) of the Act, 21 U.S.C.section 360bbb-3(b)(1), unless the authorization is terminated  or revoked sooner.       Influenza A by PCR NEGATIVE NEGATIVE Final   Influenza B by PCR NEGATIVE NEGATIVE Final    Comment: (NOTE) The Xpert Xpress SARS-CoV-2/FLU/RSV plus assay is intended as an aid in the diagnosis of influenza from Nasopharyngeal swab specimens and should not be used as a sole basis for treatment. Nasal washings and aspirates are unacceptable for Xpert Xpress SARS-CoV-2/FLU/RSV testing.  Fact Sheet for  Patients: EntrepreneurPulse.com.au  Fact Sheet for Healthcare Providers: IncredibleEmployment.be  This test is not yet approved or cleared by the [place] FDA and has been authorized for detection and/or diagnosis of SARS-CoV-2 by FDA under an Emergency Use Authorization (EUA). This EUA will remain in effect (meaning this test can be used) for the duration of the COVID-19 declaration under Section 564(b)(1) of the Act, 21 U.S.C. section 360bbb-3(b)(1), unless the authorization is terminated or revoked.  Performed at [facility], [address], [place], [place] [address]          Radiology Studies: DG Chest 2 View  Result Date: 11/25/2020 CLINICAL DATA:  Chest pain.  History of COPD and lung cancer. EXAM: CHEST - 2 VIEW COMPARISON:  Chest x-ray 09/11/2020.  CT chest 08/25/2020. FINDINGS: Surgical staple lines are again noted in the right suprahilar region. There is stable blunting of the right costophrenic angle. There is no focal lung infiltrate. There is no evidence for pneumothorax. The cardiomediastinal silhouette is within normal limits. Thoracic spinal stimulator device is again noted. Lumbar fusion hardware is partially visualized. IMPRESSION: 1. No acute cardiopulmonary process. 2. Stable chronic right pleural scarring/loculated effusion. Electronically Signed   By: Ronney Asters M.D.   On: 11/25/2020 23:28   CT Angio Chest/Abd/Pel for Dissection W and/or Wo Contrast  Result Date: 11/26/2020 CLINICAL DATA:  Chest and back pain.  Concern for aortic dissection. EXAM: CT ANGIOGRAPHY CHEST, ABDOMEN AND PELVIS TECHNIQUE: Non-contrast CT of the chest was initially obtained. Multidetector CT imaging through the chest, abdomen and pelvis was performed using the standard protocol during bolus administration of intravenous contrast. Multiplanar reconstructed images and MIPs were obtained and reviewed to evaluate the vascular anatomy. CONTRAST:  135mL OMNIPAQUE IOHEXOL 350 MG/ML SOLN COMPARISON:  Chest CT dated 08/25/2020. FINDINGS: Evaluation is limited due to streak artifact caused by spinal hardware. CTA CHEST FINDINGS Cardiovascular: There is no cardiomegaly or pericardial effusion. Mild atherosclerotic calcification of the thoracic aorta. No aneurysmal dilatation or dissection. The origins of the great vessels of the arch appear patent as visualized. No pulmonary artery embolus identified. Mediastinum/Nodes: No hilar or mediastinal adenopathy. The esophagus is grossly unremarkable. No mediastinal fluid collection.  Lungs/Pleura: Moderate emphysema. Postsurgical changes of right upper lobe with areas of scarring. Similar appearance of a 14 x 19 mm nodular density anterior to the right hilum, likely scarring. Attention on follow-up imaging recommended. No consolidative changes. Trace right pleural effusion. No pneumothorax. The central airways are patent. Musculoskeletal: Osteopenia with degenerative changes of the spine. No acute osseous pathology. Thoracic spinal stimulator. Review of the MIP images confirms the above findings. CTA ABDOMEN AND PELVIS FINDINGS VASCULAR Aorta: Moderate atherosclerotic calcification of the aorta. No aneurysmal dilatation or dissection. No periaortic fluid collection. Celiac: Atherosclerotic calcification of the origin of the celiac artery. The celiac axis and its major branches are patent. SMA: Patent without evidence of aneurysm, dissection, vasculitis or significant stenosis. Renals: The renal arteries are patent. IMA: Patent without evidence of aneurysm, dissection, vasculitis or significant stenosis. Inflow: Moderate atherosclerotic calcification of the iliac arteries. The iliac arteries remain patent. Veins: The IVC is grossly unremarkable as visualized. No portal venous gas. Review of the MIP images confirms the above findings. NON-VASCULAR No intra-abdominal free air or free fluid. Hepatobiliary: Fatty liver. There is dilatation of the central intrahepatic biliary trees and common bile duct. The common bile duct measures 16 mm in diameter. There is a 7 mm focus of calcification in the head of the pancreas in  the region of the ampulla. Further evaluation with MRCP on a nonemergent/outpatient basis is recommended. The gallbladder is unremarkable. Pancreas: There is dilatation of the main pancreatic duct measuring 6 mm in diameter, likely secondary to the 7 mm ampullary stone. No acute inflammatory changes. Spleen: Normal in size without focal abnormality. Adrenals/Urinary Tract: The adrenal  glands unremarkable. There is no hydronephrosis on either side. Small bilateral renal cysts and indeterminate 2 cm hypodense lesion in the inferior pole of the right kidney which is not characterized on this CT. Further evaluation with ultrasound on a nonemergent/outpatient basis recommended. The urinary bladder is distended otherwise unremarkable. Stomach/Bowel: There is no bowel obstruction or active inflammation. The appendix is not visualized with certainty. No inflammatory changes identified in the right lower quadrant. Lymphatic: No adenopathy. Reproductive: Hysterectomy. Other: None Musculoskeletal: Osteopenia with degenerative changes of the spine and posterior fusion hardware. No acute osseous pathology. Review of the MIP images confirms the above findings. IMPRESSION: 1. No acute intrathoracic, abdominal, or pelvic pathology. No aortic dissection or aneurysm. 2. A 7 mm focus of calcification in the region of the ampulla with dilatation of the central intrahepatic biliary trees and main pancreatic duct. Further evaluation with MRCP on a nonemergent/outpatient basis is recommended. 3. Fatty liver. 4. Small bilateral renal cysts and indeterminate 2 cm hypodense lesion in the inferior pole of the right kidney. Follow-up with ultrasound on a nonemergent/outpatient basis recommended. 5. Aortic Atherosclerosis (ICD10-I70.0) and Emphysema (ICD10-J43.9). Electronically Signed   By: Anner Crete M.D.   On: 11/26/2020 01:09        Scheduled Meds:  amLODipine  10 mg Oral Daily   atorvastatin  40 mg Oral Daily   dextromethorphan-guaiFENesin  1 tablet Oral BID   enoxaparin (LOVENOX) injection  30 mg Subcutaneous Q24H   mometasone-formoterol  2 puff Inhalation BID   nicotine  14 mg Transdermal Daily   pantoprazole  40 mg Oral Daily   vancomycin  125 mg Oral QID   Continuous Infusions:   LOS: 0 days        Natash Berman D Oz Gammel, student [facility] Pager [phone]  If 7PM-7AM,  please contact night-coverage [email or web address] Password [facility] 11/26/2020, 1:59 PM

## 2020-11-26 NOTE — Progress Notes (Signed)
Patient stated that she normally drinks ensure twice a day. Order in the computer at this time. Evidently dietician had spoken to her and ordered.

## 2020-11-26 NOTE — Telephone Encounter (Signed)
Called patient to give results for Overnight Oximetry test. Spoke to pt's husband (Per DPR) and gave him the results to use 2L O2 at night time. He voiced understanding. Jasmine [name] pt was currently In the hospital for chest pain but would relay the message to her.   FYI Dr. Melvyn Novas.

## 2020-11-26 NOTE — Telephone Encounter (Deleted)
Called patient to give results for Overnight Oximetry test. Spoke to pt's husband (Per DPR) and gave him the results. He voiced understanding. Jasmine [name] pt was currently In the hospital,

## 2020-11-26 NOTE — Progress Notes (Signed)
Patient called [place] to room with another episode of incontinence. Purewick was laying next to her, dry and not in place. Patient reports she didn't move it, but tech states it was placed when she last left the room. Rearranged patient's room for her and all belongings in place.

## 2020-11-26 NOTE — Progress Notes (Signed)
PHARMACIST - PHYSICIAN COMMUNICATION  CONCERNING:  Enoxaparin (Lovenox) for DVT Prophylaxis    RECOMMENDATION: Patient was prescribed enoxaprin 40mg  q24 hours for VTE prophylaxis.   Filed Weights   11/25/20 2114  Weight: 39.9 kg (88 lb)    Body mass index is 17.19 kg/m.  Estimated Creatinine Clearance: 43 mL/min (by C-G formula based on SCr of 0.48 mg/dL).   Patient is candidate for enoxaparin 30mg  every 24 hours based on CrCl <49ml/min or Weight <45kg  DESCRIPTION: Pharmacy has adjusted enoxaparin dose per [facility] policy.  Patient is now receiving enoxaparin 30 mg every 24 hours    Berta Minor, PharmD Clinical Pharmacist  11/26/2020 9:20 AM

## 2020-11-26 NOTE — Progress Notes (Signed)
Limited 2D echocardiogram completed.  11/26/2020 2:12 PM Kelby Aline., MHA, RVT, RDCS, RDMS

## 2020-11-26 NOTE — Progress Notes (Signed)
Attempted 2D echocardiogram, however patient states she is nauseated and wants nausea medicine. Will attempt again later as schedule permits.  11/26/2020 11:16 AM Kelby Aline., MHA, RVT, RDCS, RDMS

## 2020-11-26 NOTE — ED Notes (Signed)
Pt asking for more water- informed will not give more water at this time b/c she is having nausea.

## 2020-11-26 NOTE — Progress Notes (Signed)
Initial Nutrition Assessment  DOCUMENTATION CODES:   Severe malnutrition in context of chronic illness  INTERVENTION:  Ensure Enlive po TID, each supplement provides 350 kcal and 20 grams of protein   Magic cup TID with meals, each supplement provides 290 kcal and 9 grams of protein   NUTRITION DIAGNOSIS:   Severe Malnutrition related to chronic illness as evidenced by severe fat depletion, severe muscle depletion, energy intake < or equal to 75% for > or equal to 1 month, percent weight loss.   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 Presents with atypical chest pain.   Patient eats very little at home due to poor appetite per her report. She has been drinking ONS 2 per day. No chocolate. Prefers vanilla flavor. Complains that she is just not able to make herself eat. No known problem with chewing.   Weight loss noted since June admission when she weighed 43 kg. Currently 39.9 kg. A loss of 7% x 3 months. Severe muscle and fat depletions in multiple regions. Diagnosed with chronic severe malnutrition by RD during June hospitalization. She continues to be chronically undernourished.   Medications reviewed and include:  Protonix, Nicoderm patch   Labs: BMP Latest Ref Rng & Units 11/25/2020 11/05/2020 10/14/2020  Glucose 70 - 99 mg/dL 99 72 111(H)  BUN 8 - 23 mg/dL 8 8 9   Creatinine 0.44 - 1.00 mg/dL 0.48 0.46(L) 0.35(L)  BUN/Creat Ratio 12 - 28 - 17 -  Sodium 135 - 145 mmol/L 136 140 138  Potassium 3.5 - 5.1 mmol/L 4.7 3.6 3.5  Chloride 98 - 111 mmol/L 97(L) 103 106  CO2 22 - 32 mmol/L 28 20 26   Calcium 8.9 - 10.3 mg/dL 9.1 9.3 9.1       NUTRITION - FOCUSED PHYSICAL EXAM:  Flowsheet Row Most Recent Value  Orbital Region Moderate depletion  Upper Arm Region Severe depletion  Thoracic and Lumbar Region Severe depletion  Buccal Region Moderate depletion  Temple Region Moderate depletion  Clavicle Bone Region Severe depletion  Clavicle and Acromion Bone Region Severe depletion  Dorsal Hand Moderate depletion  Patellar Region Severe depletion  Anterior Thigh Region Severe depletion  Posterior Calf Region Severe depletion  Edema (RD Assessment) None  Hair Reviewed  Eyes Reviewed  Mouth Reviewed  Skin Reviewed  Nails Reviewed      Diet Order:   Diet Order             Diet Heart Room service appropriate? Yes; Fluid consistency: Thin  Diet effective now                   EDUCATION NEEDS:  Education needs have been addressed  Skin:  Skin Assessment: Reviewed RN Assessment  Last BM:  9/21  Height:   Ht Readings from Last 1 Encounters:  11/25/20 5' (1.524 m)    Weight:   Wt Readings from Last 1 Encounters:  11/25/20 39.9 kg    Ideal Body Weight:   45 kg  BMI:  Body mass index is 17.19 kg/m.  Estimated Nutritional Needs:   Kcal:  1400-1560  Protein:  68-76 gr  Fluid:  1200 ml  daily   Colman Cater MS,RD,CSG,LDN Contact: Shea Evans

## 2020-11-26 NOTE — ED Notes (Signed)
Patient transported to CT 

## 2020-11-26 NOTE — Progress Notes (Signed)
Notified night doc adefeso of pt request for tizanidine,cymbalta, and trazodone. Doc seen message and no new orders

## 2020-11-26 NOTE — ED Provider Notes (Signed)
  Physical Exam  BP (!) 149/84   Pulse 89   Temp 98.2 F (36.8 C) (Oral)   Resp (P) 16   Ht 5' (1.524 m)   Wt 39.9 kg   SpO2 100%   BMI 17.19 kg/m   Physical Exam Vitals and nursing note reviewed.  Constitutional:      Comments: Patient is a somewhat cachectic female in no acute distress.  HENT:     Head: Normocephalic and atraumatic.  Cardiovascular:     Rate and Rhythm: Normal rate and regular rhythm.  Pulmonary:     Effort: No tachypnea or accessory muscle usage.     Breath sounds: Examination of the right-middle field reveals wheezing. Examination of the left-middle field reveals wheezing. Wheezing present.  Musculoskeletal:        General: Normal range of motion.     Right lower leg: No tenderness. No edema.     Left lower leg: No tenderness. No edema.  Skin:    General: Skin is warm and dry.  Neurological:     Mental Status: She is alert.    ED Course/Procedures   Clinical Course as of 11/26/20 0438  Thu Nov 25, 2020  2355 Chloride(!): 97 [RR]    Clinical Course User Index [RR] Sherrell Puller, PA-C    Procedures  MDM  Care assumed from Dr. Sabra Heck at shift change.  Patient awaiting results of a CTA of the chest and repeat troponin.  Patient presenting here with chest pain radiating through into her back.  The studies have returned and are unremarkable.  Patient reporting no relief and continues to experience what she describes as 10 out of 10 pain.  She has received morphine and now Dilaudid/Toradol.  Her pain has improved and is now rated as a 7 out of 10.  She reports ongoing nausea and restlessness.  I am uncertain as to the etiology of her pain, but will discuss with the hospitalist for admission/observation.       Veryl Speak, MD 11/26/20 [phone]

## 2020-11-27 DIAGNOSIS — R0789 Other chest pain: Secondary | ICD-10-CM | POA: Diagnosis not present

## 2020-11-27 LAB — CBC
HCT: 40.1 % (ref 36.0–46.0)
Hemoglobin: 12.4 g/dL (ref 12.0–15.0)
MCH: 30.8 pg (ref 26.0–34.0)
MCHC: 30.9 g/dL (ref 30.0–36.0)
MCV: 99.8 fL (ref 80.0–100.0)
Platelets: 268 10*3/uL (ref 150–400)
RBC: 4.02 MIL/uL (ref 3.87–5.11)
RDW: 14.8 % (ref 11.5–15.5)
WBC: 5.6 10*3/uL (ref 4.0–10.5)
nRBC: 0 % (ref 0.0–0.2)

## 2020-11-27 LAB — COMPREHENSIVE METABOLIC PANEL
ALT: 15 U/L (ref 0–44)
AST: 22 U/L (ref 15–41)
Albumin: 3.3 g/dL — ABNORMAL LOW (ref 3.5–5.0)
Alkaline Phosphatase: 79 U/L (ref 38–126)
Anion gap: 11 (ref 5–15)
BUN: 11 mg/dL (ref 8–23)
CO2: 30 mmol/L (ref 22–32)
Calcium: 9.5 mg/dL (ref 8.9–10.3)
Chloride: 93 mmol/L — ABNORMAL LOW (ref 98–111)
Creatinine, Ser: 0.47 mg/dL (ref 0.44–1.00)
GFR, Estimated: >60 mL/min (ref >60)
Glucose, Bld: 115 mg/dL — ABNORMAL HIGH (ref 70–99)
Potassium: 3.7 mmol/L (ref 3.5–5.1)
Sodium: 134 mmol/L — ABNORMAL LOW (ref 135–145)
Total Bilirubin: 0.7 mg/dL (ref 0.3–1.2)
Total Protein: 6.7 g/dL (ref 6.5–8.1)

## 2020-11-27 LAB — PHOSPHORUS: Phosphorus: 3 mg/dL (ref 2.5–4.6)

## 2020-11-27 LAB — APTT: aPTT: 30 seconds (ref 24–36)

## 2020-11-27 LAB — PROTIME-INR
INR: 0.8 (ref 0.8–1.2)
Prothrombin Time: 11.1 seconds — ABNORMAL LOW (ref 11.4–15.2)

## 2020-11-27 LAB — MAGNESIUM: Magnesium: 2 mg/dL (ref 1.7–2.4)

## 2020-11-27 MED ORDER — HYDROMORPHONE HCL 1 MG/ML IJ SOLN
0.5000 mg | Freq: Four times a day (QID) | INTRAMUSCULAR | Status: DC | PRN
Start: 2020-11-27 — End: 2020-11-28
  Administered 2020-11-27 – 2020-11-28 (×2): 0.5 mg via INTRAVENOUS
  Filled 2020-11-27 (×2): qty 0.5

## 2020-11-27 MED ORDER — TRAZODONE HCL 50 MG PO TABS
150.0000 mg | ORAL_TABLET | Freq: Every day | ORAL | Status: DC
  Administered 2020-11-27: 150 mg via ORAL
  Filled 2020-11-27: qty 3

## 2020-11-27 MED ORDER — DULOXETINE HCL 60 MG PO CPEP
90.0000 mg | ORAL_CAPSULE | Freq: Every day | ORAL | Status: DC
  Administered 2020-11-27 – 2020-11-28 (×2): 90 mg via ORAL
  Filled 2020-11-27 (×2): qty 1

## 2020-11-27 MED ORDER — TIZANIDINE HCL 2 MG PO TABS
6.0000 mg | ORAL_TABLET | Freq: Three times a day (TID) | ORAL | Status: DC
  Administered 2020-11-27 – 2020-11-28 (×4): 6 mg via ORAL
  Filled 2020-11-27 (×5): qty 3

## 2020-11-27 MED ORDER — OXYCODONE HCL 5 MG PO TABS
10.0000 mg | ORAL_TABLET | ORAL | Status: DC | PRN
  Administered 2020-11-27 – 2020-11-28 (×5): 10 mg via ORAL
  Filled 2020-11-27 (×5): qty 2

## 2020-11-27 NOTE — Plan of Care (Signed)
  Problem: Acute Rehab PT Goals(only PT should resolve) Goal: Pt Will Ambulate Flowsheets (Taken 11/27/2020 1029) Pt will Ambulate:  75 feet  with modified independence Goal: Pt/caregiver will Perform Home Exercise Program Flowsheets (Taken 11/27/2020 1029) Pt/caregiver will Perform Home Exercise Program:  For increased strengthening  For improved balance

## 2020-11-27 NOTE — Progress Notes (Signed)
PROGRESS NOTE    Jasmine [name]  AQT:[id number] DOB: 03/04/1956 DOA: 11/25/2020 PCP: Janora Norlander, DO   Brief Narrative:   Jasmine [name] is a 65 year old female who presents with atypical chest pain. She reports sharp throbbing pain in her chest, left side, from front to back, tender to palpation, pain without palpation, pain with breathing. Hx of copd, lung cancer, c-diff currently, htn, hld, anemia, chronic back pain, lumbar fusion, spinal stimulator.  Work-up essentially unrevealing for ACS and no aortic dissection or other acute abnormalities noted.  She continues to have severe pain that appears to be musculoskeletal in nature.  Assessment & Plan:   Principal Problem:   Atypical chest pain Active Problems:   HTN (hypertension)   COPD GOLD II    Tobacco abuse   GERD (gastroesophageal reflux disease)   Elevated MCV   Nausea & vomiting   History of Clostridioides difficile infection   Ongoing chest pain suspicious of musculoskeletal causes -Chest CTA negative for aortic dissection -No significant findings of ACS with limited echo on 9/23 with poor imaging at that time -Wean IV Dilaudid use and increased oral oxycodone  Hypertension-controlled -Continue current medications  COPD -No active exacerbation -Continue breathing treatments  Tobacco abuse -Continue nicotine patch -Encourage smoking cessation  History of prior C. difficile colitis -Completed vancomycin course  Aortic atherosclerosis -Continue statin  DVT prophylaxis: Lovenox Code Status: Full Family Communication: None at bedside Disposition Plan:  Status is: Observation  The patient will require care spanning > 2 midnights and should be moved to inpatient because: Ongoing active pain requiring inpatient pain management  Dispo: The patient is from: Home              Anticipated d/c is to: Home              Patient currently is not medically stable to d/c.   Difficult to place patient  No       Nutritional Assessment:  The patient's BMI is: Body mass index is 17.19 kg/m.Marland Kitchen  Seen by dietician.  I agree with the assessment and plan as outlined below:  Nutrition Status: Nutrition Problem: Severe Malnutrition Etiology: chronic illness Signs/Symptoms: severe fat depletion, severe muscle depletion, energy intake < or equal to 75% for > or equal to 1 month, percent weight loss Percent weight loss: 7 % (x 3 months) Interventions: Ensure Enlive (each supplement provides 350kcal and 20 grams of protein), Magic cup, Liberalize Diet  . Consultants:  None  Procedures:  None  Antimicrobials:  Anti-infectives (From admission, onward)    Start     Dose/Rate Route Frequency Ordered Stop   11/26/20 0930  vancomycin (VANCOCIN) capsule 125 mg        125 mg Oral 4 times daily 11/26/20 0913         Subjective: Patient seen and evaluated today with ongoing chest wall pain that is quite severe, but she is slowly improving.  Objective: Vitals:   11/26/20 1945 11/26/20 2111 11/27/20 0517 11/27/20 0743  BP:  (!) 148/87 (!) 154/88   Pulse:  83 84   Resp:  18 18   Temp:  98.2 F (36.8 C) 98.1 F (36.7 C)   TempSrc:  Oral Oral   SpO2: 100% 100% 99% 100%  Weight:      Height:        Intake/Output Summary (Last 24 hours) at 11/27/2020 1447 Last data filed at 11/27/2020 0700 Gross per 24 hour  Intake 120 ml  Output 800 ml  Net -680 ml   Filed Weights   11/25/20 2114  Weight: 39.9 kg    Examination:  General exam: Appears calm and comfortable, thin/frail Respiratory system: Clear to auscultation. Respiratory effort normal.  Currently on nasal cannula oxygen. Cardiovascular system: S1 & S2 heard, RRR.  Gastrointestinal system: Abdomen is soft Central nervous system: Alert and awake Extremities: No edema Skin: No significant lesions noted Psychiatry: Flat affect.    Data Reviewed: I have personally reviewed following labs and imaging  studies  CBC: Recent Labs  Lab 11/25/20 2220 11/27/20 0630  WBC 6.9 5.6  HGB 12.6 12.4  HCT 41.1 40.1  MCV 102.8* 99.8  PLT 270 081   Basic Metabolic Panel: Recent Labs  Lab 11/25/20 2220 11/27/20 0630  NA 136 134*  K 4.7 3.7  CL 97* 93*  CO2 28 30  GLUCOSE 99 115*  BUN 8 11  CREATININE 0.48 0.47  CALCIUM 9.1 9.5  MG  --  2.0  PHOS  --  3.0   GFR: Estimated Creatinine Clearance: 43 mL/min (by C-G formula based on SCr of 0.47 mg/dL). Liver Function Tests: Recent Labs  Lab 11/27/20 0630  AST 22  ALT 15  ALKPHOS 79  BILITOT 0.7  PROT 6.7  ALBUMIN 3.3*   No results for input(s): LIPASE, AMYLASE in the last 168 hours. No results for input(s): AMMONIA in the last 168 hours. Coagulation Profile: Recent Labs  Lab 11/27/20 0630  INR 0.8   Cardiac Enzymes: No results for input(s): CKTOTAL, CKMB, CKMBINDEX, TROPONINI in the last 168 hours. BNP (last 3 results) No results for input(s): PROBNP in the last 8760 hours. HbA1C: No results for input(s): HGBA1C in the last 72 hours. CBG: No results for input(s): GLUCAP in the last 168 hours. Lipid Profile: No results for input(s): CHOL, HDL, LDLCALC, TRIG, CHOLHDL, LDLDIRECT in the last 72 hours. Thyroid Function Tests: No results for input(s): TSH, T4TOTAL, FREET4, T3FREE, THYROIDAB in the last 72 hours. Anemia Panel: No results for input(s): VITAMINB12, FOLATE, FERRITIN, TIBC, IRON, RETICCTPCT in the last 72 hours. Sepsis Labs: No results for input(s): PROCALCITON, LATICACIDVEN in the last 168 hours.  Recent Results (from the past 240 hour(s))  Resp Panel by RT-PCR (Flu A&B, Covid) Nasopharyngeal Swab     Status: None   Collection Time: 11/26/20  4:35 AM   Specimen: Nasopharyngeal Swab; Nasopharyngeal(NP) swabs in vial transport medium  Result Value Ref Range Status   SARS Coronavirus 2 by RT PCR NEGATIVE NEGATIVE Final    Comment: (NOTE) SARS-CoV-2 target nucleic acids are NOT DETECTED.  The SARS-CoV-2 RNA  is generally detectable in upper respiratory specimens during the acute phase of infection. The lowest concentration of SARS-CoV-2 viral copies this assay can detect is 138 copies/mL. A negative result does not preclude SARS-Cov-2 infection and should not be used as the sole basis for treatment or other patient management decisions. A negative result may occur with  improper specimen collection/handling, submission of specimen other than nasopharyngeal swab, presence of viral mutation(s) within the areas targeted by this assay, and inadequate number of viral copies(<138 copies/mL). A negative result must be combined with clinical observations, patient history, and epidemiological information. The expected result is Negative.  Fact Sheet for Patients:  EntrepreneurPulse.com.au  Fact Sheet for Healthcare Providers:  IncredibleEmployment.be  This test is no t yet approved or cleared by the [place] FDA and  has been authorized for detection and/or diagnosis of SARS-CoV-2 by FDA under an Emergency  Use Authorization (EUA). This EUA will remain  in effect (meaning this test can be used) for the duration of the COVID-19 declaration under Section 564(b)(1) of the Act, 21 U.S.C.section 360bbb-3(b)(1), unless the authorization is terminated  or revoked sooner.       Influenza A by PCR NEGATIVE NEGATIVE Final   Influenza B by PCR NEGATIVE NEGATIVE Final    Comment: (NOTE) The Xpert Xpress SARS-CoV-2/FLU/RSV plus assay is intended as an aid in the diagnosis of influenza from Nasopharyngeal swab specimens and should not be used as a sole basis for treatment. Nasal washings and aspirates are unacceptable for Xpert Xpress SARS-CoV-2/FLU/RSV testing.  Fact Sheet for Patients: EntrepreneurPulse.com.au  Fact Sheet for Healthcare Providers: IncredibleEmployment.be  This test is not yet approved or cleared by the  [place] FDA and has been authorized for detection and/or diagnosis of SARS-CoV-2 by FDA under an Emergency Use Authorization (EUA). This EUA will remain in effect (meaning this test can be used) for the duration of the COVID-19 declaration under Section 564(b)(1) of the Act, 21 U.S.C. section 360bbb-3(b)(1), unless the authorization is terminated or revoked.  Performed at [facility], [address], [place], [place] [address]          Radiology Studies: DG Chest 2 View  Result Date: 11/25/2020 CLINICAL DATA:  Chest pain.  History of COPD and lung cancer. EXAM: CHEST - 2 VIEW COMPARISON:  Chest x-ray 09/11/2020.  CT chest 08/25/2020. FINDINGS: Surgical staple lines are again noted in the right suprahilar region. There is stable blunting of the right costophrenic angle. There is no focal lung infiltrate. There is no evidence for pneumothorax. The cardiomediastinal silhouette is within normal limits. Thoracic spinal stimulator device is again noted. Lumbar fusion hardware is partially visualized. IMPRESSION: 1. No acute cardiopulmonary process. 2. Stable chronic right pleural scarring/loculated effusion. Electronically Signed   By: Ronney Asters M.D.   On: 11/25/2020 23:28   CT Angio Chest/Abd/Pel for Dissection W and/or Wo Contrast  Result Date: 11/26/2020 CLINICAL DATA:  Chest and back pain.  Concern for aortic dissection. EXAM: CT ANGIOGRAPHY CHEST, ABDOMEN AND PELVIS TECHNIQUE: Non-contrast CT of the chest was initially obtained. Multidetector CT imaging through the chest, abdomen and pelvis was performed using the standard protocol during bolus administration of intravenous contrast. Multiplanar reconstructed images and MIPs were obtained and reviewed to evaluate the vascular anatomy. CONTRAST:  16mL OMNIPAQUE IOHEXOL 350 MG/ML SOLN COMPARISON:  Chest CT dated 08/25/2020. FINDINGS: Evaluation is limited due to streak artifact caused by spinal hardware. CTA CHEST FINDINGS  Cardiovascular: There is no cardiomegaly or pericardial effusion. Mild atherosclerotic calcification of the thoracic aorta. No aneurysmal dilatation or dissection. The origins of the great vessels of the arch appear patent as visualized. No pulmonary artery embolus identified. Mediastinum/Nodes: No hilar or mediastinal adenopathy. The esophagus is grossly unremarkable. No mediastinal fluid collection. Lungs/Pleura: Moderate emphysema. Postsurgical changes of right upper lobe with areas of scarring. Similar appearance of a 14 x 19 mm nodular density anterior to the right hilum, likely scarring. Attention on follow-up imaging recommended. No consolidative changes. Trace right pleural effusion. No pneumothorax. The central airways are patent. Musculoskeletal: Osteopenia with degenerative changes of the spine. No acute osseous pathology. Thoracic spinal stimulator. Review of the MIP images confirms the above findings. CTA ABDOMEN AND PELVIS FINDINGS VASCULAR Aorta: Moderate atherosclerotic calcification of the aorta. No aneurysmal dilatation or dissection. No periaortic fluid collection. Celiac: Atherosclerotic calcification of the origin of the celiac artery. The celiac axis and its  major branches are patent. SMA: Patent without evidence of aneurysm, dissection, vasculitis or significant stenosis. Renals: The renal arteries are patent. IMA: Patent without evidence of aneurysm, dissection, vasculitis or significant stenosis. Inflow: Moderate atherosclerotic calcification of the iliac arteries. The iliac arteries remain patent. Veins: The IVC is grossly unremarkable as visualized. No portal venous gas. Review of the MIP images confirms the above findings. NON-VASCULAR No intra-abdominal free air or free fluid. Hepatobiliary: Fatty liver. There is dilatation of the central intrahepatic biliary trees and common bile duct. The common bile duct measures 16 mm in diameter. There is a 7 mm focus of calcification in the head of  the pancreas in the region of the ampulla. Further evaluation with MRCP on a nonemergent/outpatient basis is recommended. The gallbladder is unremarkable. Pancreas: There is dilatation of the main pancreatic duct measuring 6 mm in diameter, likely secondary to the 7 mm ampullary stone. No acute inflammatory changes. Spleen: Normal in size without focal abnormality. Adrenals/Urinary Tract: The adrenal glands unremarkable. There is no hydronephrosis on either side. Small bilateral renal cysts and indeterminate 2 cm hypodense lesion in the inferior pole of the right kidney which is not characterized on this CT. Further evaluation with ultrasound on a nonemergent/outpatient basis recommended. The urinary bladder is distended otherwise unremarkable. Stomach/Bowel: There is no bowel obstruction or active inflammation. The appendix is not visualized with certainty. No inflammatory changes identified in the right lower quadrant. Lymphatic: No adenopathy. Reproductive: Hysterectomy. Other: None Musculoskeletal: Osteopenia with degenerative changes of the spine and posterior fusion hardware. No acute osseous pathology. Review of the MIP images confirms the above findings. IMPRESSION: 1. No acute intrathoracic, abdominal, or pelvic pathology. No aortic dissection or aneurysm. 2. A 7 mm focus of calcification in the region of the ampulla with dilatation of the central intrahepatic biliary trees and main pancreatic duct. Further evaluation with MRCP on a nonemergent/outpatient basis is recommended. 3. Fatty liver. 4. Small bilateral renal cysts and indeterminate 2 cm hypodense lesion in the inferior pole of the right kidney. Follow-up with ultrasound on a nonemergent/outpatient basis recommended. 5. Aortic Atherosclerosis (ICD10-I70.0) and Emphysema (ICD10-J43.9). Electronically Signed   By: Anner Crete M.D.   On: 11/26/2020 01:09   ECHOCARDIOGRAM LIMITED  Result Date: 11/26/2020    ECHOCARDIOGRAM LIMITED REPORT    Patient Name:   BRYANT LIPPS Angulo Date of Exam: 11/26/2020 Medical Rec #:  [id number]       Height:       60.0 in Accession #:    [id number]      Weight:       88.0 lb Date of Birth:  June 05, 1955       BSA:          1.317 m Patient Age:    14 years        BP:           142/79 mmHg Patient Gender: F               HR:           98 bpm. Exam Location:  Inpatient Procedure: Limited Echo, Cardiac Doppler and Color Doppler Indications:    R07.9* Chest pain, unspecified  History:        Patient has prior history of Echocardiogram examinations. COPD;                 Risk Factors:Hypertension.  Sonographer:    Maudry Mayhew MHA, RDMS, RVT, RDCS Referring Phys: [id number] Royanne Foots Juncal  Comments: Suboptimal parasternal window. IMPRESSIONS  1. Left ventricular endocardial border not optimally defined to evaluate regional wall motion. Left ventricular diastolic parameters are consistent with Grade I diastolic dysfunction (impaired relaxation).  2. The mitral valve was not well visualized. No evidence of mitral valve regurgitation. No evidence of mitral stenosis.  3. The aortic valve was not well visualized. Aortic valve regurgitation is not visualized. No aortic stenosis is present.  4. Tricuspid regurgitation signal is inadequate for assessing PA pressure.  5. The inferior vena cava is normal in size with greater than 50% respiratory variability, suggesting right atrial pressure of 3 mmHg.  6. Limited echo for chest pain FINDINGS  Left Ventricle: Left ventricular endocardial border not optimally defined to evaluate regional wall motion. Left ventricular diastolic parameters are consistent with Grade I diastolic dysfunction (impaired relaxation). Right Ventricle: Tricuspid regurgitation signal is inadequate for assessing PA pressure. Pericardium: There is no evidence of pericardial effusion. Mitral Valve: The mitral valve was not well visualized. No evidence of mitral valve stenosis. Aortic Valve: The aortic valve was  not well visualized. Aortic valve regurgitation is not visualized. No aortic stenosis is present. Aortic valve mean gradient measures 4.2 mmHg. Aortic valve peak gradient measures 7.7 mmHg. Pulmonic Valve: The pulmonic valve was not well visualized. Pulmonic valve regurgitation is not visualized. No evidence of pulmonic stenosis. Aorta: The aortic root is normal in size and structure. Venous: The inferior vena cava is normal in size with greater than 50% respiratory variability, suggesting right atrial pressure of 3 mmHg. LEFT VENTRICLE PLAX 2D LVIDd:         4.50 cm LVIDs:         3.80 cm LV PW:         0.70 cm LV IVS:        0.60 cm  LV Volumes (MOD) LV vol d, MOD A2C: 57.6 ml LV vol d, MOD A4C: 48.6 ml LV vol s, MOD A2C: 22.7 ml LV vol s, MOD A4C: 17.7 ml LV SV MOD A2C:     34.9 ml LV SV MOD A4C:     48.6 ml LV SV MOD BP:      35.1 ml AORTIC VALVE AV Vmax:           138.45 cm/s AV Vmean:          98.721 cm/s AV VTI:            0.227 m AV Peak Grad:      7.7 mmHg AV Mean Grad:      4.2 mmHg LVOT Vmax:         126.09 cm/s LVOT Vmean:        76.685 cm/s LVOT VTI:          0.211 m LVOT/AV VTI ratio: 0.93  AORTA Ao Root diam: 2.40 cm  SHUNTS Systemic VTI: 0.21 m Carlyle Dolly MD Electronically signed by Carlyle Dolly MD Signature Date/Time: 11/26/2020/4:07:54 PM    Final         Scheduled Meds:  amLODipine  10 mg Oral Daily   atorvastatin  40 mg Oral Daily   dextromethorphan-guaiFENesin  1 tablet Oral BID   DULoxetine  90 mg Oral Daily   enoxaparin (LOVENOX) injection  30 mg Subcutaneous Q24H   feeding supplement  237 mL Oral TID BM   mometasone-formoterol  2 puff Inhalation BID   nicotine  14 mg Transdermal Daily   pantoprazole  40 mg Oral Daily   tiZANidine  6 mg  Oral TID   traZODone  150 mg Oral QHS   vancomycin  125 mg Oral QID     LOS: 0 days    Time spent: 35 minutes    Gay Moncivais Darleen Crocker, DO [facility]  If 7PM-7AM, please contact night-coverage [email or web address] 11/27/2020,  2:47 PM

## 2020-11-27 NOTE — Evaluation (Signed)
Physical Therapy Evaluation Patient Details Name: Jasmine [name] MRN: [id number] DOB: 17-May-1955 Today's Date: 11/27/2020  History of Present Illness  HPI: Jasmine [name] is a 65 y.o. female with medical history significant for  normocytic anemia, history of blood transfusion, anxiety, osteoarthritis, COPD, active light smoker, chronic back pain, easy bruising, GERD, headache, nephrolithiasis, hypertension, hyperglycemia, neuropathy, normocytic anemia, history of pancreatitis, right lung cancer, sciatica and seasonal allergies who presents to the emergency department due to chest pain, nausea and vomiting which started yesterday) 9/22).  Chest pain was in anterior chest and has a reproducible component, the pain was described as throbbing.  She states that she had a CT found that she was on second round of antibiotic treatment.  She denies fever, chills, headache, blurry vision or weakness.  Aspirin 324mg  oxycodone 10 mg taken prior to EMS did not alleviate the pain.  Clinical Impression  PT states that she is falling a lot at home and has been for awhile.  Therapist asked if she ever considered Assisted living.  PT states she would like to go but husband refuses.        Recommendations for follow up therapy are one component of a multi-disciplinary discharge planning process, led by the attending physician.  Recommendations may be updated based on patient status, additional functional criteria and insurance authorization.  Follow Up Recommendations Home health PT    Equipment Recommendations  None recommended by PT    Recommendations for Other Services  none     Precautions / Restrictions   falls     Mobility  Bed Mobility Overal bed mobility: Modified Independent                  Transfers Overall transfer level: Modified independent Equipment used: Rolling walker (2 wheeled)                Ambulation/Gait Ambulation/Gait assistance: Modified independent  (Device/Increase time) Gait Distance (Feet): 48 Feet Assistive device: Rolling walker (2 wheeled) Gait Pattern/deviations: Decreased dorsiflexion - right   Gait velocity interpretation: 1.31 - 2.62 ft/sec, indicative of limited community ambulator           Pertinent Vitals/Pain Pain Assessment: No/denies pain    Home Living Family/patient expects to be discharged to:: Private residence Living Arrangements: Spouse/significant other Available Help at Discharge: Family;Available 24 hours/day Type of Home: House Home Access: Stairs to enter   [employer] of Steps: 2 Home Layout: Able to live on main level with bedroom/bathroom;Two level Home Equipment: Walker - 4 wheels;Bedside commode;Shower seat;Grab bars - toilet;Grab bars - tub/shower;Wheelchair - manual;Cane - single point Additional Comments: Has an AFO for prolong ambulation    Prior Function Level of Independence: Independent with assistive device(s)               Hand Dominance        Extremity/Trunk Assessment   Upper Extremity Assessment Upper Extremity Assessment: Defer to OT evaluation    Lower Extremity Assessment Lower Extremity Assessment: Generalized weakness;Overall WFL for tasks assessed       Communication   Communication: No difficulties  Cognition Arousal/Alertness: Awake/alert Behavior During Therapy: WFL for tasks assessed/performed Overall Cognitive Status: Within Functional Limits for tasks assessed                                               Assessment/Plan  PT Assessment Patient needs continued PT services  PT Problem List Decreased strength;Decreased activity tolerance       PT Treatment Interventions Gait training;Therapeutic exercise    PT Goals (Current goals can be found in the Care Plan section)  Acute Rehab PT Goals PT Goal Formulation: With patient Time For Goal Achievement: 12/03/20 Potential to Achieve Goals: Good     Frequency Min 2X/week           AM-PAC PT "6 Clicks" Mobility  Outcome Measure Help needed turning from your back to your side while in a flat bed without using bedrails?: None Help needed moving from lying on your back to sitting on the side of a flat bed without using bedrails?: None Help needed moving to and from a bed to a chair (including a wheelchair)?: None Help needed standing up from a chair using your arms (e.g., wheelchair or bedside chair)?: None Help needed to walk in hospital room?: A Little Help needed climbing 3-5 steps with a railing? : A Little 6 Click Score: 22    End of Session Equipment Utilized During Treatment: Gait belt;Oxygen (6L pt is on O2 at home) Activity Tolerance: Patient limited by fatigue Patient left: in bed (per pt request) Nurse Communication: Mobility status PT Visit Diagnosis: Repeated falls (R29.6);Muscle weakness (generalized) (M62.81)    Time: 1000-1030 PT Time Calculation (min) (ACUTE ONLY): 30 min   Charges:   PT Evaluation $PT Eval Low Complexity: Roann, PT CLT [phone]  11/27/2020, 10:30 AM

## 2020-11-27 NOTE — Care Management Obs Status (Signed)
[facility] NOTIFICATION   Patient Details  Name: Jasmine [name] MRN: [id number] Date of Birth: [birth year]   Medicare Observation Status Notification Given:  Yes    Natasha Bence, LCSW 11/27/2020, 12:57 PM

## 2020-11-27 NOTE — Plan of Care (Signed)
  Problem: Education: Goal: Knowledge of the prescribed therapeutic regimen will improve Outcome: Progressing   Problem: Activity: Goal: Ability to perform activities at highest level will improve Outcome: Progressing   Problem: Bowel/Gastric: Goal: Occurrences of nausea will decrease Outcome: Progressing Goal: Bowel function will improve Outcome: Progressing   Problem: Nutritional: Goal: Maintenance of adequate nutrition will improve Outcome: Progressing   Problem: Clinical Measurements: Goal: Will remain free from infection Outcome: Progressing   Problem: Skin Integrity: Goal: Status of oral mucous membranes will improve Outcome: Progressing

## 2020-11-28 DIAGNOSIS — R0789 Other chest pain: Secondary | ICD-10-CM | POA: Diagnosis not present

## 2020-11-28 LAB — CBC
HCT: 36.7 % (ref 36.0–46.0)
Hemoglobin: 11.5 g/dL — ABNORMAL LOW (ref 12.0–15.0)
MCH: 31.7 pg (ref 26.0–34.0)
MCHC: 31.3 g/dL (ref 30.0–36.0)
MCV: 101.1 fL — ABNORMAL HIGH (ref 80.0–100.0)
Platelets: 229 10*3/uL (ref 150–400)
RBC: 3.63 MIL/uL — ABNORMAL LOW (ref 3.87–5.11)
RDW: 14.8 % (ref 11.5–15.5)
WBC: 4.7 10*3/uL (ref 4.0–10.5)
nRBC: 0 % (ref 0.0–0.2)

## 2020-11-28 LAB — BASIC METABOLIC PANEL
Anion gap: 8 (ref 5–15)
BUN: 11 mg/dL (ref 8–23)
CO2: 31 mmol/L (ref 22–32)
Calcium: 9.2 mg/dL (ref 8.9–10.3)
Chloride: 96 mmol/L — ABNORMAL LOW (ref 98–111)
Creatinine, Ser: 0.46 mg/dL (ref 0.44–1.00)
GFR, Estimated: >60 mL/min (ref >60)
Glucose, Bld: 131 mg/dL — ABNORMAL HIGH (ref 70–99)
Potassium: 3.3 mmol/L — ABNORMAL LOW (ref 3.5–5.1)
Sodium: 135 mmol/L (ref 135–145)

## 2020-11-28 LAB — MAGNESIUM: Magnesium: 1.9 mg/dL (ref 1.7–2.4)

## 2020-11-28 MED ORDER — POTASSIUM CHLORIDE CRYS ER 20 MEQ PO TBCR
40.0000 meq | EXTENDED_RELEASE_TABLET | Freq: Once | ORAL | Status: AC
  Administered 2020-11-28: 40 meq via ORAL
  Filled 2020-11-28: qty 2

## 2020-11-28 NOTE — TOC Transition Note (Signed)
Transition of Care [facility]) - CM/SW Discharge Note   Patient Details  Name: Jasmine [name] MRN: [id number] Date of Birth: 08-05-1955  Transition of Care [facility] CM/SW Contact:  Natasha Bence, LCSW Phone Number: 11/28/2020, 12:35 PM   Clinical Narrative:    CSW notified of patient's readiness for discharge and [place] needs. Patient agreeable to [facility]. CSW referred patient to [place] with Alvis Lemmings. [name] agreeable to provide [facility] services. TOC signing off.    Final next level of care: [place] Barriers to Discharge: Barriers Resolved   Patient Goals and CMS Choice Patient states their goals for this hospitalization and ongoing recovery are:: Return home with [facility] CMS Medicare.gov Compare Post Acute Care list provided to:: Patient Choice offered to / list presented to : Patient  Discharge Placement                    Patient and family notified of of transfer: 11/28/20  Discharge Plan and Services                          HH Arranged: PT [facility] Agency: [place] Date [place]: 11/28/20 Time [place]: [id number] Representative spoke with at [place]: [place]) Interventions     Readmission Risk Interventions Readmission Risk Prevention Plan 10/13/2020 01/07/2019  Transportation Screening Complete Complete  Palliative Care Screening - Not Applicable  Medication Review (RN Care Manager) Complete Complete  PCP or Specialist appointment within 3-5 days of discharge Complete -  [place] or Home Care Consult Complete -  SW Recovery Care/Counseling Consult Complete -  Palliative Care Screening Not Applicable -  [place] Not Applicable -  Some recent data might be hidden

## 2020-11-28 NOTE — Discharge Summary (Signed)
Physician Discharge Summary  Jasmine [name] PPI:[id number] DOB: 1955/08/11 DOA: 11/25/2020  PCP: Janora Norlander, DO  Admit date: 11/25/2020  Discharge date: 11/28/2020  Admitted From:Home  Disposition:  Home  Recommendations for Outpatient Follow-up:  Follow up with PCP in 1-2 weeks Continue on home pain medications as prior Continue other home medications as prior  Home Health: None  Equipment/Devices: Has chronic home O2  Discharge Condition:Stable  CODE STATUS: Full  Diet recommendation: Heart Healthy  Brief/Interim Summary:  Jasmine [name] is a 65 year old female who presents with atypical chest pain. She reports sharp throbbing pain in her chest, left side, from front to back, tender to palpation, pain without palpation, pain with breathing. Hx of copd, lung cancer, c-diff currently, htn, hld, anemia, chronic back pain, lumbar fusion, spinal stimulator.  Work-up essentially unrevealing for ACS and no aortic dissection or other acute abnormalities noted.  She was noted to have significant ongoing pain that was musculoskeletal in nature and required a couple days of admission for IV pain medications and pain control.  She is now in stable condition for discharge with no other acute findings noted.  Discharge Diagnoses:  Principal Problem:   Atypical chest pain Active Problems:   HTN (hypertension)   COPD GOLD II    Tobacco abuse   GERD (gastroesophageal reflux disease)   Elevated MCV   Nausea & vomiting   History of Clostridioides difficile infection  Principal discharge diagnosis: Atypical chest pain secondary to musculoskeletal causes.  Discharge Instructions  Discharge Instructions     Diet - low sodium heart healthy   Complete by: As directed    Increase activity slowly   Complete by: As directed       Allergies as of 11/28/2020       Reactions   Lyrica [pregabalin] Other (See Comments)   EXTREME SHAKING/TREMBLING   Darvon [propoxyphene]     UNSPECIFIED REACTION    Gabapentin    Extreme shaking and trembling    Augmentin [amoxicillin-pot Clavulanate] Nausea And Vomiting   Has patient had a PCN reaction causing immediate rash, facial/tongue/throat swelling, SOB or lightheadedness with hypotension:no Has patient had a PCN reaction causing severe rash involving mucus membranes or skin necrosis:No Has patient had a PCN reaction that required hospitalization:No Has patient had a PCN reaction occurring within the last 10 years:Yes--ONLY N/V If all of the above answers are "NO", then may proceed with Cephalosporin use.   Erythromycin Itching, Rash      Vibramycin [doxycycline Calcium] Itching, Rash   "[place] "        Medication List     STOP taking these medications    vancomycin 125 MG capsule Commonly known as: Vancocin       TAKE these medications    albuterol 108 (90 Base) MCG/ACT inhaler Commonly known as: VENTOLIN HFA Inhale into the lungs every 6 (six) hours as needed for wheezing or shortness of breath.   amLODipine 10 MG tablet Commonly known as: NORVASC Take 1 tablet (10 mg total) by mouth daily.   atorvastatin 40 MG tablet Commonly known as: Lipitor Take 1 tablet (40 mg total) by mouth daily.   budesonide-formoterol 160-4.5 MCG/ACT inhaler Commonly known as: SYMBICORT USE 2 INHALATIONS TWICE A DAY   Cholecalciferol 125 MCG (5000 UT) capsule Take 5,000 Units by mouth daily.   dextromethorphan 30 MG/5ML liquid Commonly known as: DELSYM Take 5 mLs (30 mg total) by mouth every 8 (eight) hours as needed for cough.  dextromethorphan-guaiFENesin 30-600 MG 12hr tablet Commonly known as: MUCINEX DM Take 1 tablet by mouth 2 (two) times daily.   diclofenac sodium 1 % Gel Commonly known as: VOLTAREN Apply 1 application topically daily as needed for muscle pain.   DULoxetine 30 MG capsule Commonly known as: CYMBALTA Take 3 capsules (90 mg total) by mouth daily.   Ensure High Protein  Liqd Drink 1 can 3 times daily.   ipratropium-albuterol 0.5-2.5 (3) MG/3ML Soln Commonly known as: DUONEB Inhale 3 mLs into the lungs 4 (four) times daily as needed (for shortness of breath/wheezing). *May use as needed   magnesium oxide 400 (241.3 Mg) MG tablet Commonly known as: MAG-OX Take 400 mg by mouth 2 (two) times daily.   Metanx 3-90.314-2-35 MG Caps TAKE 1 TABLET BY MOUTH TWO TIMES A DAY What changed: when to take this   nicotine 14 mg/24hr patch Commonly known as: Nicoderm CQ Place 1 patch (14 mg total) onto the skin daily.   omeprazole 40 MG capsule Commonly known as: PRILOSEC Take 1 capsule (40 mg total) by mouth 2 (two) times daily. Take 1 capsule 30 mins before breakfast and evening meal.   ondansetron 4 MG disintegrating tablet Commonly known as: Zofran ODT Take 1 tablet (4 mg total) by mouth every 8 (eight) hours as needed for nausea or vomiting.   oxyCODONE-acetaminophen 10-325 MG tablet Commonly known as: PERCOCET Take 1 tablet by mouth 5 (five) times daily.   OxyCONTIN 15 mg 12 hr tablet Generic drug: oxyCODONE Take 15 mg by mouth every 12 (twelve) hours.   OXYGEN Inhale 3 L into the lungs See admin instructions. continuous   Poly-Iron 150 Forte 150-0.025-1 MG Caps Generic drug: Iron Polysacch Cmplx-B12-FA Take 1 capsule by mouth 2 (two) times daily.   Spiriva Respimat 2.5 MCG/ACT Aers Generic drug: Tiotropium Bromide Monohydrate USE 2 INHALATIONS DAILY What changed: See the new instructions.   tizanidine 6 MG capsule Commonly known as: ZANAFLEX Take 6 mg by mouth 3 (three) times daily.   traZODone 150 MG tablet Commonly known as: DESYREL TAKE 1 TO 2 TABLETS AT BEDTIME What changed: See the new instructions.        Follow-up Information     Ronnie Doss M, DO. Schedule an appointment as soon as possible for a visit in 1 week(s).   Specialty: Family Medicine Contact information: [place]  [address] [phone]                Allergies  Allergen Reactions   Lyrica [Pregabalin] Other (See Comments)    EXTREME SHAKING/TREMBLING   Darvon [Propoxyphene]     UNSPECIFIED REACTION    Gabapentin     Extreme shaking and trembling    Augmentin [Amoxicillin-Pot Clavulanate] Nausea And Vomiting    Has patient had a PCN reaction causing immediate rash, facial/tongue/throat swelling, SOB or lightheadedness with hypotension:no Has patient had a PCN reaction causing severe rash involving mucus membranes or skin necrosis:No Has patient had a PCN reaction that required hospitalization:No Has patient had a PCN reaction occurring within the last 10 years:Yes--ONLY N/V If all of the above answers are "NO", then may proceed with Cephalosporin use.    Erythromycin Itching and Rash        Vibramycin [Doxycycline Calcium] Itching and Rash    "[place] "    Consultations: None   Procedures/Studies: DG Chest 2 View  Result Date: 11/25/2020 CLINICAL DATA:  Chest pain.  History of COPD and lung cancer. EXAM: CHEST -  2 VIEW COMPARISON:  Chest x-ray 09/11/2020.  CT chest 08/25/2020. FINDINGS: Surgical staple lines are again noted in the right suprahilar region. There is stable blunting of the right costophrenic angle. There is no focal lung infiltrate. There is no evidence for pneumothorax. The cardiomediastinal silhouette is within normal limits. Thoracic spinal stimulator device is again noted. Lumbar fusion hardware is partially visualized. IMPRESSION: 1. No acute cardiopulmonary process. 2. Stable chronic right pleural scarring/loculated effusion. Electronically Signed   By: Ronney Asters M.D.   On: 11/25/2020 23:28   CT Angio Chest/Abd/Pel for Dissection W and/or Wo Contrast  Result Date: 11/26/2020 CLINICAL DATA:  Chest and back pain.  Concern for aortic dissection. EXAM: CT ANGIOGRAPHY CHEST, ABDOMEN AND PELVIS TECHNIQUE: Non-contrast CT of the chest was initially obtained.  Multidetector CT imaging through the chest, abdomen and pelvis was performed using the standard protocol during bolus administration of intravenous contrast. Multiplanar reconstructed images and MIPs were obtained and reviewed to evaluate the vascular anatomy. CONTRAST:  162mL OMNIPAQUE IOHEXOL 350 MG/ML SOLN COMPARISON:  Chest CT dated 08/25/2020. FINDINGS: Evaluation is limited due to streak artifact caused by spinal hardware. CTA CHEST FINDINGS Cardiovascular: There is no cardiomegaly or pericardial effusion. Mild atherosclerotic calcification of the thoracic aorta. No aneurysmal dilatation or dissection. The origins of the great vessels of the arch appear patent as visualized. No pulmonary artery embolus identified. Mediastinum/Nodes: No hilar or mediastinal adenopathy. The esophagus is grossly unremarkable. No mediastinal fluid collection. Lungs/Pleura: Moderate emphysema. Postsurgical changes of right upper lobe with areas of scarring. Similar appearance of a 14 x 19 mm nodular density anterior to the right hilum, likely scarring. Attention on follow-up imaging recommended. No consolidative changes. Trace right pleural effusion. No pneumothorax. The central airways are patent. Musculoskeletal: Osteopenia with degenerative changes of the spine. No acute osseous pathology. Thoracic spinal stimulator. Review of the MIP images confirms the above findings. CTA ABDOMEN AND PELVIS FINDINGS VASCULAR Aorta: Moderate atherosclerotic calcification of the aorta. No aneurysmal dilatation or dissection. No periaortic fluid collection. Celiac: Atherosclerotic calcification of the origin of the celiac artery. The celiac axis and its major branches are patent. SMA: Patent without evidence of aneurysm, dissection, vasculitis or significant stenosis. Renals: The renal arteries are patent. IMA: Patent without evidence of aneurysm, dissection, vasculitis or significant stenosis. Inflow: Moderate atherosclerotic calcification of  the iliac arteries. The iliac arteries remain patent. Veins: The IVC is grossly unremarkable as visualized. No portal venous gas. Review of the MIP images confirms the above findings. NON-VASCULAR No intra-abdominal free air or free fluid. Hepatobiliary: Fatty liver. There is dilatation of the central intrahepatic biliary trees and common bile duct. The common bile duct measures 16 mm in diameter. There is a 7 mm focus of calcification in the head of the pancreas in the region of the ampulla. Further evaluation with MRCP on a nonemergent/outpatient basis is recommended. The gallbladder is unremarkable. Pancreas: There is dilatation of the main pancreatic duct measuring 6 mm in diameter, likely secondary to the 7 mm ampullary stone. No acute inflammatory changes. Spleen: Normal in size without focal abnormality. Adrenals/Urinary Tract: The adrenal glands unremarkable. There is no hydronephrosis on either side. Small bilateral renal cysts and indeterminate 2 cm hypodense lesion in the inferior pole of the right kidney which is not characterized on this CT. Further evaluation with ultrasound on a nonemergent/outpatient basis recommended. The urinary bladder is distended otherwise unremarkable. Stomach/Bowel: There is no bowel obstruction or active inflammation. The appendix is not visualized with certainty. No  inflammatory changes identified in the right lower quadrant. Lymphatic: No adenopathy. Reproductive: Hysterectomy. Other: None Musculoskeletal: Osteopenia with degenerative changes of the spine and posterior fusion hardware. No acute osseous pathology. Review of the MIP images confirms the above findings. IMPRESSION: 1. No acute intrathoracic, abdominal, or pelvic pathology. No aortic dissection or aneurysm. 2. A 7 mm focus of calcification in the region of the ampulla with dilatation of the central intrahepatic biliary trees and main pancreatic duct. Further evaluation with MRCP on a nonemergent/outpatient basis  is recommended. 3. Fatty liver. 4. Small bilateral renal cysts and indeterminate 2 cm hypodense lesion in the inferior pole of the right kidney. Follow-up with ultrasound on a nonemergent/outpatient basis recommended. 5. Aortic Atherosclerosis (ICD10-I70.0) and Emphysema (ICD10-J43.9). Electronically Signed   By: Anner Crete M.D.   On: 11/26/2020 01:09   ECHOCARDIOGRAM LIMITED  Result Date: 11/26/2020    ECHOCARDIOGRAM LIMITED REPORT   Patient Name:   NAIYAH KLOSTERMANN Truex Date of Exam: 11/26/2020 Medical Rec #:  [id number]       Height:       60.0 in Accession #:    [id number]      Weight:       88.0 lb Date of Birth:  [birth year]       BSA:          1.317 m Patient Age:    72 years        BP:           142/79 mmHg Patient Gender: F               HR:           98 bpm. Exam Location:  Inpatient Procedure: Limited Echo, Cardiac Doppler and Color Doppler Indications:    R07.9* Chest pain, unspecified  History:        Patient has prior history of Echocardiogram examinations. COPD;                 Risk Factors:Hypertension.  Sonographer:    Maudry Mayhew MHA, [place], RVT, RDCS Referring Phys: [id number] Royanne Foots [facility]  Sonographer Comments: Suboptimal parasternal window. IMPRESSIONS  1. Left ventricular endocardial border not optimally defined to evaluate regional wall motion. Left ventricular diastolic parameters are consistent with Grade I diastolic dysfunction (impaired relaxation).  2. The mitral valve was not well visualized. No evidence of mitral valve regurgitation. No evidence of mitral stenosis.  3. The aortic valve was not well visualized. Aortic valve regurgitation is not visualized. No aortic stenosis is present.  4. Tricuspid regurgitation signal is inadequate for assessing PA pressure.  5. The inferior vena cava is normal in size with greater than 50% respiratory variability, suggesting right atrial pressure of 3 mmHg.  6. Limited echo for chest pain FINDINGS  Left Ventricle: Left ventricular endocardial  border not optimally defined to evaluate regional wall motion. Left ventricular diastolic parameters are consistent with Grade I diastolic dysfunction (impaired relaxation). Right Ventricle: Tricuspid regurgitation signal is inadequate for assessing PA pressure. Pericardium: There is no evidence of pericardial effusion. Mitral Valve: The mitral valve was not well visualized. No evidence of mitral valve stenosis. Aortic Valve: The aortic valve was not well visualized. Aortic valve regurgitation is not visualized. No aortic stenosis is present. Aortic valve mean gradient measures 4.2 mmHg. Aortic valve peak gradient measures 7.7 mmHg. Pulmonic Valve: The pulmonic valve was not well visualized. Pulmonic valve regurgitation is not visualized. No evidence of pulmonic stenosis. Aorta: The aortic root is normal in  size and structure. Venous: The inferior vena cava is normal in size with greater than 50% respiratory variability, suggesting right atrial pressure of 3 mmHg. LEFT VENTRICLE PLAX 2D LVIDd:         4.50 cm LVIDs:         3.80 cm LV PW:         0.70 cm LV IVS:        0.60 cm  LV Volumes (MOD) LV vol d, MOD A2C: 57.6 ml LV vol d, MOD A4C: 48.6 ml LV vol s, MOD A2C: 22.7 ml LV vol s, MOD A4C: 17.7 ml LV SV MOD A2C:     34.9 ml LV SV MOD A4C:     48.6 ml LV SV MOD BP:      35.1 ml AORTIC VALVE AV Vmax:           138.45 cm/s AV Vmean:          98.721 cm/s AV VTI:            0.227 m AV Peak Grad:      7.7 mmHg AV Mean Grad:      4.2 mmHg LVOT Vmax:         126.09 cm/s LVOT Vmean:        76.685 cm/s LVOT VTI:          0.211 m LVOT/AV VTI ratio: 0.93  AORTA Ao Root diam: 2.40 cm  SHUNTS Systemic VTI: 0.21 m Carlyle Dolly MD Electronically signed by Carlyle Dolly MD Signature Date/Time: 11/26/2020/4:07:54 PM    Final      Discharge Exam: Vitals:   11/28/20 0513 11/28/20 0742  BP: 133/80   Pulse: 80   Resp: 19   Temp: 98 F (36.7 C)   SpO2: 100% 100%   Vitals:   11/27/20 1938 11/27/20 2114 11/28/20 0513  11/28/20 0742  BP:  (!) 145/92 133/80   Pulse:  92 80   Resp:  19 19   Temp:  97.9 F (36.6 C) 98 F (36.7 C)   TempSrc:  Oral Oral   SpO2: 100% 100% 100% 100%  Weight:      Height:        General: Pt is alert, awake, not in acute distress Cardiovascular: RRR, S1/S2 +, no rubs, no gallops Respiratory: CTA bilaterally, no wheezing, no rhonchi, on nasal cannula oxygen Abdominal: Soft, NT, ND, bowel sounds + Extremities: no edema, no cyanosis    The results of significant diagnostics from this hospitalization (including imaging, microbiology, ancillary and laboratory) are listed below for reference.     Microbiology: Recent Results (from the past 240 hour(s))  Resp Panel by RT-PCR (Flu A&B, Covid) Nasopharyngeal Swab     Status: None   Collection Time: 11/26/20  4:35 AM   Specimen: Nasopharyngeal Swab; Nasopharyngeal(NP) swabs in vial transport medium  Result Value Ref Range Status   SARS Coronavirus 2 by RT PCR NEGATIVE NEGATIVE Final    Comment: (NOTE) SARS-CoV-2 target nucleic acids are NOT DETECTED.  The SARS-CoV-2 RNA is generally detectable in upper respiratory specimens during the acute phase of infection. The lowest concentration of SARS-CoV-2 viral copies this assay can detect is 138 copies/mL. A negative result does not preclude SARS-Cov-2 infection and should not be used as the sole basis for treatment or other patient management decisions. A negative result may occur with  improper specimen collection/handling, submission of specimen other than nasopharyngeal swab, presence of viral mutation(s) within the areas targeted by this  assay, and inadequate number of viral copies(<138 copies/mL). A negative result must be combined with clinical observations, patient history, and epidemiological information. The expected result is Negative.  Fact Sheet for Patients:  EntrepreneurPulse.com.au  Fact Sheet for Healthcare Providers:   IncredibleEmployment.be  This test is no t yet approved or cleared by the [place] FDA and  has been authorized for detection and/or diagnosis of SARS-CoV-2 by FDA under an Emergency Use Authorization (EUA). This EUA will remain  in effect (meaning this test can be used) for the duration of the COVID-19 declaration under Section 564(b)(1) of the Act, 21 U.S.C.section 360bbb-3(b)(1), unless the authorization is terminated  or revoked sooner.       Influenza A by PCR NEGATIVE NEGATIVE Final   Influenza B by PCR NEGATIVE NEGATIVE Final    Comment: (NOTE) The Xpert Xpress SARS-CoV-2/FLU/RSV plus assay is intended as an aid in the diagnosis of influenza from Nasopharyngeal swab specimens and should not be used as a sole basis for treatment. Nasal washings and aspirates are unacceptable for Xpert Xpress SARS-CoV-2/FLU/RSV testing.  Fact Sheet for Patients: EntrepreneurPulse.com.au  Fact Sheet for Healthcare Providers: IncredibleEmployment.be  This test is not yet approved or cleared by the [place] FDA and has been authorized for detection and/or diagnosis of SARS-CoV-2 by FDA under an Emergency Use Authorization (EUA). This EUA will remain in effect (meaning this test can be used) for the duration of the COVID-19 declaration under Section 564(b)(1) of the Act, 21 U.S.C. section 360bbb-3(b)(1), unless the authorization is terminated or revoked.  Performed at [facility], [address], [place],  [address]      Labs: BNP (last 3 results) No results for input(s): BNP in the last 8760 hours. Basic Metabolic Panel: Recent Labs  Lab 11/25/20 2220 11/27/20 0630 11/28/20 0539  NA 136 134* 135  K 4.7 3.7 3.3*  CL 97* 93* 96*  CO2 28 30 31   GLUCOSE 99 115* 131*  BUN 8 11 11   CREATININE 0.48 0.47 0.46  CALCIUM 9.1 9.5 9.2  MG  --  2.0 1.9  PHOS  --  3.0  --    Liver Function Tests: Recent Labs  Lab  11/27/20 0630  AST 22  ALT 15  ALKPHOS 79  BILITOT 0.7  PROT 6.7  ALBUMIN 3.3*   No results for input(s): LIPASE, AMYLASE in the last 168 hours. No results for input(s): AMMONIA in the last 168 hours. CBC: Recent Labs  Lab 11/25/20 2220 11/27/20 0630 11/28/20 0539  WBC 6.9 5.6 4.7  HGB 12.6 12.4 11.5*  HCT 41.1 40.1 36.7  MCV 102.8* 99.8 101.1*  PLT 270 268 229   Cardiac Enzymes: No results for input(s): CKTOTAL, CKMB, CKMBINDEX, TROPONINI in the last 168 hours. BNP: Invalid input(s): POCBNP CBG: No results for input(s): GLUCAP in the last 168 hours. D-Dimer No results for input(s): DDIMER in the last 72 hours. Hgb A1c No results for input(s): HGBA1C in the last 72 hours. Lipid Profile No results for input(s): CHOL, HDL, LDLCALC, TRIG, CHOLHDL, LDLDIRECT in the last 72 hours. Thyroid function studies No results for input(s): TSH, T4TOTAL, T3FREE, THYROIDAB in the last 72 hours.  Invalid input(s): FREET3 Anemia work up No results for input(s): VITAMINB12, FOLATE, FERRITIN, TIBC, IRON, RETICCTPCT in the last 72 hours. Urinalysis    Component Value Date/Time   COLORURINE YELLOW 10/11/2020 0017   APPEARANCEUR CLEAR 10/11/2020 0017   APPEARANCEUR Clear 07/26/2017 1636   LABSPEC 1.011 10/11/2020 0017   PHURINE 6.0  10/11/2020 0017   GLUCOSEU NEGATIVE 10/11/2020 0017   HGBUR NEGATIVE 10/11/2020 0017   BILIRUBINUR NEGATIVE 10/11/2020 0017   BILIRUBINUR Negative 07/26/2017 1636   KETONESUR 80 (A) 10/11/2020 0017   PROTEINUR 100 (A) 10/11/2020 0017   NITRITE NEGATIVE 10/11/2020 0017   LEUKOCYTESUR NEGATIVE 10/11/2020 0017   Sepsis Labs Invalid input(s): PROCALCITONIN,  WBC,  LACTICIDVEN Microbiology Recent Results (from the past 240 hour(s))  Resp Panel by RT-PCR (Flu A&B, Covid) Nasopharyngeal Swab     Status: None   Collection Time: 11/26/20  4:35 AM   Specimen: Nasopharyngeal Swab; Nasopharyngeal(NP) swabs in vial transport medium  Result Value Ref Range  Status   SARS Coronavirus 2 by RT PCR NEGATIVE NEGATIVE Final    Comment: (NOTE) SARS-CoV-2 target nucleic acids are NOT DETECTED.  The SARS-CoV-2 RNA is generally detectable in upper respiratory specimens during the acute phase of infection. The lowest concentration of SARS-CoV-2 viral copies this assay can detect is 138 copies/mL. A negative result does not preclude SARS-Cov-2 infection and should not be used as the sole basis for treatment or other patient management decisions. A negative result may occur with  improper specimen collection/handling, submission of specimen other than nasopharyngeal swab, presence of viral mutation(s) within the areas targeted by this assay, and inadequate number of viral copies(<138 copies/mL). A negative result must be combined with clinical observations, patient history, and epidemiological information. The expected result is Negative.  Fact Sheet for Patients:  EntrepreneurPulse.com.au  Fact Sheet for Healthcare Providers:  IncredibleEmployment.be  This test is no t yet approved or cleared by the [place] FDA and  has been authorized for detection and/or diagnosis of SARS-CoV-2 by FDA under an Emergency Use Authorization (EUA). This EUA will remain  in effect (meaning this test can be used) for the duration of the COVID-19 declaration under Section 564(b)(1) of the Act, 21 U.S.C.section 360bbb-3(b)(1), unless the authorization is terminated  or revoked sooner.       Influenza A by PCR NEGATIVE NEGATIVE Final   Influenza B by PCR NEGATIVE NEGATIVE Final    Comment: (NOTE) The Xpert Xpress SARS-CoV-2/FLU/RSV plus assay is intended as an aid in the diagnosis of influenza from Nasopharyngeal swab specimens and should not be used as a sole basis for treatment. Nasal washings and aspirates are unacceptable for Xpert Xpress SARS-CoV-2/FLU/RSV testing.  Fact Sheet for  Patients: EntrepreneurPulse.com.au  Fact Sheet for Healthcare Providers: IncredibleEmployment.be  This test is not yet approved or cleared by the [place] FDA and has been authorized for detection and/or diagnosis of SARS-CoV-2 by FDA under an Emergency Use Authorization (EUA). This EUA will remain in effect (meaning this test can be used) for the duration of the COVID-19 declaration under Section 564(b)(1) of the Act, 21 U.S.C. section 360bbb-3(b)(1), unless the authorization is terminated or revoked.  Performed at [facility], [address], [place], [place] [address]      Time coordinating discharge: 35 minutes  SIGNED:   Rodena Goldmann, DO [facility] 11/28/2020, 9:33 AM  If 7PM-7AM, please contact night-coverage [email or web address]

## 2020-11-28 NOTE — Progress Notes (Signed)
PT Cancellation Note  Patient Details Name: Jasmine [name] MRN: [id number] DOB: 1956-02-08   Cancelled Treatment:    Order received for evaluation and treat.  PT was evaluated yesterday; please see note.    Rayetta Humphrey, PT CLT [phone]  11/28/2020, 10:04 AM

## 2020-12-01 ENCOUNTER — Ambulatory Visit: Payer: Medicare Other | Admitting: Family Medicine

## 2020-12-14 ENCOUNTER — Other Ambulatory Visit: Payer: Self-pay

## 2020-12-14 ENCOUNTER — Ambulatory Visit (INDEPENDENT_AMBULATORY_CARE_PROVIDER_SITE_OTHER): Payer: Medicare Other

## 2020-12-14 DIAGNOSIS — Z9181 History of falling: Secondary | ICD-10-CM

## 2020-12-14 DIAGNOSIS — J90 Pleural effusion, not elsewhere classified: Secondary | ICD-10-CM

## 2020-12-14 DIAGNOSIS — M170 Bilateral primary osteoarthritis of knee: Secondary | ICD-10-CM

## 2020-12-14 DIAGNOSIS — J439 Emphysema, unspecified: Secondary | ICD-10-CM | POA: Diagnosis not present

## 2020-12-14 DIAGNOSIS — J9611 Chronic respiratory failure with hypoxia: Secondary | ICD-10-CM

## 2020-12-14 DIAGNOSIS — A0472 Enterocolitis due to Clostridium difficile, not specified as recurrent: Secondary | ICD-10-CM

## 2020-12-14 DIAGNOSIS — Z981 Arthrodesis status: Secondary | ICD-10-CM

## 2020-12-14 DIAGNOSIS — I10 Essential (primary) hypertension: Secondary | ICD-10-CM

## 2020-12-14 DIAGNOSIS — J984 Other disorders of lung: Secondary | ICD-10-CM

## 2020-12-14 DIAGNOSIS — G5600 Carpal tunnel syndrome, unspecified upper limb: Secondary | ICD-10-CM

## 2020-12-14 DIAGNOSIS — G629 Polyneuropathy, unspecified: Secondary | ICD-10-CM

## 2020-12-14 DIAGNOSIS — I708 Atherosclerosis of other arteries: Secondary | ICD-10-CM

## 2020-12-14 DIAGNOSIS — J45909 Unspecified asthma, uncomplicated: Secondary | ICD-10-CM

## 2020-12-14 DIAGNOSIS — Z9682 Presence of neurostimulator: Secondary | ICD-10-CM

## 2020-12-14 DIAGNOSIS — Z902 Acquired absence of lung [part of]: Secondary | ICD-10-CM

## 2020-12-14 DIAGNOSIS — M5430 Sciatica, unspecified side: Secondary | ICD-10-CM

## 2020-12-14 DIAGNOSIS — Z79891 Long term (current) use of opiate analgesic: Secondary | ICD-10-CM

## 2020-12-14 DIAGNOSIS — K219 Gastro-esophageal reflux disease without esophagitis: Secondary | ICD-10-CM

## 2020-12-14 DIAGNOSIS — M160 Bilateral primary osteoarthritis of hip: Secondary | ICD-10-CM

## 2020-12-14 DIAGNOSIS — G40909 Epilepsy, unspecified, not intractable, without status epilepticus: Secondary | ICD-10-CM

## 2020-12-14 DIAGNOSIS — K8689 Other specified diseases of pancreas: Secondary | ICD-10-CM

## 2020-12-14 DIAGNOSIS — K760 Fatty (change of) liver, not elsewhere classified: Secondary | ICD-10-CM

## 2020-12-14 DIAGNOSIS — Z7951 Long term (current) use of inhaled steroids: Secondary | ICD-10-CM

## 2020-12-14 DIAGNOSIS — I700 Atherosclerosis of aorta: Secondary | ICD-10-CM

## 2020-12-14 DIAGNOSIS — E785 Hyperlipidemia, unspecified: Secondary | ICD-10-CM

## 2020-12-14 DIAGNOSIS — M21371 Foot drop, right foot: Secondary | ICD-10-CM

## 2020-12-14 DIAGNOSIS — Z8701 Personal history of pneumonia (recurrent): Secondary | ICD-10-CM

## 2020-12-14 DIAGNOSIS — Z85118 Personal history of other malignant neoplasm of bronchus and lung: Secondary | ICD-10-CM

## 2020-12-14 DIAGNOSIS — Z9981 Dependence on supplemental oxygen: Secondary | ICD-10-CM

## 2020-12-14 DIAGNOSIS — F419 Anxiety disorder, unspecified: Secondary | ICD-10-CM

## 2020-12-14 DIAGNOSIS — M479 Spondylosis, unspecified: Secondary | ICD-10-CM

## 2020-12-14 DIAGNOSIS — D509 Iron deficiency anemia, unspecified: Secondary | ICD-10-CM

## 2020-12-14 DIAGNOSIS — Q6102 Congenital multiple renal cysts: Secondary | ICD-10-CM

## 2020-12-14 DIAGNOSIS — D62 Acute posthemorrhagic anemia: Secondary | ICD-10-CM

## 2020-12-14 DIAGNOSIS — M8588 Other specified disorders of bone density and structure, other site: Secondary | ICD-10-CM

## 2020-12-14 DIAGNOSIS — F17210 Nicotine dependence, cigarettes, uncomplicated: Secondary | ICD-10-CM

## 2020-12-14 DIAGNOSIS — G8929 Other chronic pain: Secondary | ICD-10-CM

## 2020-12-15 ENCOUNTER — Ambulatory Visit: Payer: Medicare Other | Admitting: Internal Medicine

## 2020-12-17 NOTE — Telephone Encounter (Signed)
Called patient regarding her mychart message. Spoke to husband (per DPR) regarding a portable oxygen concentrator. Husband stated that he thought pt was walked in office during last OV and believed she didn't go below 90% but was unsure. Documentation of previous walks in office copied and pasted from 11/03/20 OV notes below in bold. Informed husband that if she had walked and did not go below 90 during the walk that she would not qualify for portable oxygen. He voiced understanding. Husband is wanting to know if Dr. Melvyn Novas would recommend patient walking again to see if she could qualify for oxygen or have any other recommendations before the husband buys a portable oxygen concentrator out of pocket. Patient is currently using 2L O2 at night time and during the day as needed.   o2 2lpm with sleep and as needed daytime -  12/02/2018 ambulatory O2 walk test with no desaturations.   -  08/05/2019   Walked RA  approx   200 ft  @ slow pace  stopped due to  Sob/ no desats    -  02/19/2020   Walked RA  approx  254ft  @ slow pace  stopped due to  Sob/ sats still 94%    - ONO RA  03/09/2020 :   desats on RA < 89% x 2 h 39 min > 03/23/2020 rec 2lpm   - 11/03/2020 no desats walking 450 ft RA   Dr. Melvyn Novas please advise.

## 2020-12-21 ENCOUNTER — Telehealth: Payer: Self-pay | Admitting: Internal Medicine

## 2020-12-21 NOTE — Telephone Encounter (Signed)
I have faxed over the information that they request to [facility].  Nothing further is needed.

## 2021-01-03 ENCOUNTER — Other Ambulatory Visit: Payer: Self-pay | Admitting: Thoracic Surgery (Cardiothoracic Vascular Surgery)

## 2021-01-03 DIAGNOSIS — R0781 Pleurodynia: Secondary | ICD-10-CM

## 2021-01-03 DIAGNOSIS — R0789 Other chest pain: Secondary | ICD-10-CM

## 2021-01-04 ENCOUNTER — Encounter: Payer: Self-pay | Admitting: Thoracic Surgery (Cardiothoracic Vascular Surgery)

## 2021-01-04 ENCOUNTER — Ambulatory Visit
Admission: RE | Admit: 2021-01-04 | Discharge: 2021-01-04 | Disposition: A | Discharge status: Home / Self Care | Payer: Medicare Other | Source: Ambulatory Visit | Attending: Thoracic Surgery (Cardiothoracic Vascular Surgery) | Admitting: Thoracic Surgery (Cardiothoracic Vascular Surgery)

## 2021-01-04 ENCOUNTER — Ambulatory Visit (INDEPENDENT_AMBULATORY_CARE_PROVIDER_SITE_OTHER): Payer: Medicare Other | Admitting: Thoracic Surgery (Cardiothoracic Vascular Surgery)

## 2021-01-04 ENCOUNTER — Other Ambulatory Visit: Payer: Self-pay

## 2021-01-04 VITALS — BP 134/65 | HR 105 | Resp 20 | Ht 60.0 in | Wt 99.0 lb

## 2021-01-04 DIAGNOSIS — Z902 Acquired absence of lung [part of]: Secondary | ICD-10-CM

## 2021-01-04 DIAGNOSIS — R0789 Other chest pain: Secondary | ICD-10-CM

## 2021-01-04 DIAGNOSIS — C3411 Malignant neoplasm of upper lobe, right bronchus or lung: Secondary | ICD-10-CM | POA: Diagnosis not present

## 2021-01-04 NOTE — Progress Notes (Signed)
[address]       ,[place] [address]             [phone]     HPI: Jasmine [name] returns for a scheduled follow-up visit  Jasmine [name] is a 65 year old woman with a history of tobacco abuse, COPD, chronic pain, asthma, reflux, hypertension, anemia, anxiety, peripheral neuropathy, pancreatitis, pneumonia, and seizures.  She had a robotic assisted right upper lobectomy in May 2022.  She had a T2, N0, stage Ib non-small cell carcinoma.  She had significant pain postoperatively.  She continues to have issues with shortness of breath.  She remains on home oxygen.  She has been on oxygen since the surgery.  She was recently hospitalized with pneumonia.  They did an extensive work-up including CT angiogram to rule out aortic dissection.  There was some scarring in the right hilum but overall unremarkable postoperative appearance.  There was some dilatation of her biliary tree and main pancreatic duct.  She is having that evaluated in the near future.  She continues to have some discomfort but overall her pain is much better than it was at her last visit.  Primary concern is that she still tires easily.  Past Medical History:  Diagnosis Date   Allergy    Anemia    Anxiety    Arthritis    Asthma    Carpal tunnel syndrome    Chronic back pain    Cigarette smoker 4/40/3474   Complication of anesthesia    in the past has had N/V, the last few surgeries she has not been   COPD (chronic obstructive pulmonary disease) ([place])    Easy bruising 07/10/2016   GERD (gastroesophageal reflux disease)    Headache    Heart murmur 2005   never had any problems   History of blood transfusion 2018   History of kidney stones    Hypertension    Hypoglycemia    Hypoglycemia    Iron deficiency anemia 07/11/2016   Neuropathy    both arms   Normocytic anemia 07/29/2015   Pancreatitis    Pneumonia    PONV (postoperative nausea and vomiting)    Postoperative anemia due to acute blood  loss 11/15/2015   R lung cancer    Sciatica    Seizures ([place])    as a child when she would have an asthma attack. none as an adult   Shortness of breath dyspnea     Current Outpatient Medications  Medication Sig Dispense Refill   albuterol (VENTOLIN HFA) 108 (90 Base) MCG/ACT inhaler Inhale into the lungs every 6 (six) hours as needed for wheezing or shortness of breath.     amLODipine (NORVASC) 10 MG tablet Take 1 tablet (10 mg total) by mouth daily. 90 tablet 3   atorvastatin (LIPITOR) 40 MG tablet Take 1 tablet (40 mg total) by mouth daily. 90 tablet 3   budesonide-formoterol (SYMBICORT) 160-4.5 MCG/ACT inhaler USE 2 INHALATIONS TWICE A DAY 30 g 0   Cholecalciferol 125 MCG (5000 UT) capsule Take 5,000 Units by mouth daily.     dextromethorphan (DELSYM) 30 MG/5ML liquid Take 5 mLs (30 mg total) by mouth every 8 (eight) hours as needed for cough. (Patient not taking: Reported on 11/26/2020) 148 mL 0   dextromethorphan-guaiFENesin (MUCINEX DM) 30-600 MG 12hr tablet Take 1 tablet by mouth 2 (two) times daily.     diclofenac sodium (VOLTAREN) 1 % GEL Apply 1 application topically daily as needed  for muscle pain.     DULoxetine (CYMBALTA) 30 MG capsule Take 3 capsules (90 mg total) by mouth daily. 270 capsule 4   ipratropium-albuterol (DUONEB) 0.5-2.5 (3) MG/3ML SOLN Inhale 3 mLs into the lungs 4 (four) times daily as needed (for shortness of breath/wheezing). *May use as needed     Iron Polysacch Cmplx-B12-FA (POLY-IRON 150 FORTE) 150-0.025-1 MG CAPS Take 1 capsule by mouth 2 (two) times daily. (Patient not taking: Reported on 11/26/2020) 60 capsule 2   L-Methylfolate-Algae-B12-B6 (METANX) 3-90.314-2-35 MG CAPS TAKE 1 TABLET BY MOUTH TWO TIMES A DAY (Patient taking differently: Take 1 tablet by mouth in the morning and at bedtime.) 180 capsule 4   magnesium oxide (MAG-OX) 400 (241.3 Mg) MG tablet Take 400 mg by mouth 2 (two) times daily.     nicotine (NICODERM CQ) 14 mg/24hr patch Place 1 patch (14  mg total) onto the skin daily. 30 patch 0   Nutritional Supplements (ENSURE HIGH PROTEIN) LIQD Drink 1 can 3 times daily. 21330 mL PRN   omeprazole (PRILOSEC) 40 MG capsule Take 1 capsule (40 mg total) by mouth 2 (two) times daily. Take 1 capsule 30 mins before breakfast and evening meal. 90 capsule 3   ondansetron (ZOFRAN ODT) 4 MG disintegrating tablet Take 1 tablet (4 mg total) by mouth every 8 (eight) hours as needed for nausea or vomiting. 20 tablet 0   oxyCODONE-acetaminophen (PERCOCET) 10-325 MG tablet Take 1 tablet by mouth 5 (five) times daily.     OXYCONTIN 15 MG 12 hr tablet Take 15 mg by mouth every 12 (twelve) hours.     OXYGEN Inhale 3 L into the lungs See admin instructions. continuous     SPIRIVA RESPIMAT 2.5 MCG/ACT AERS USE 2 INHALATIONS DAILY (Patient taking differently: Inhale 2 puffs into the lungs daily.) 12 g 3   tizanidine (ZANAFLEX) 6 MG capsule Take 6 mg by mouth 3 (three) times daily.     traZODone (DESYREL) 150 MG tablet TAKE 1 TO 2 TABLETS AT BEDTIME (Patient taking differently: Take 150-300 mg by mouth at bedtime.) 180 tablet 3   No current facility-administered medications for this visit.    Physical Exam BP 134/65   Pulse (!) 105   Resp 20   Ht 5' (1.524 m)   Wt 99 lb (44.9 kg)   SpO2 98% Comment: 3L O2 per [place]  BMI 19.44 kg/m  64 year old woman in no acute distress Alert and oriented x3 with no focal deficits Lungs diminished breath sounds bilaterally, absent right base Cardiac regular, mildly tachycardic Incisions well-healed No cervical or supraclavicular adenopathy  Diagnostic Tests: CHEST - 2 VIEW   COMPARISON:  11/25/2020.   FINDINGS: Heart size and mediastinal contours are stable. Postop change within the right suprahilar region with suture chain is again noted and appears stable from previous exam. There is asymmetric right lung volume loss. Blunting of the right costophrenic angle is unchanged. New opacity within the right lung base is  noted which may represent atelectasis or pneumonia. Left lung appears clear. Dorsal column stimulator is again noted terminating in the midthoracic spine.   IMPRESSION: New right lung base opacity which may represent atelectasis or pneumonia.     Electronically Signed   By: Kerby Moors M.D.   On: 01/04/2021 14:22 CT ANGIOGRAPHY CHEST, ABDOMEN AND PELVIS   TECHNIQUE: Non-contrast CT of the chest was initially obtained.   Multidetector CT imaging through the chest, abdomen and pelvis was performed using the standard protocol during bolus administration  of intravenous contrast. Multiplanar reconstructed images and MIPs were obtained and reviewed to evaluate the vascular anatomy.   CONTRAST:  167mL OMNIPAQUE IOHEXOL 350 MG/ML SOLN   COMPARISON:  Chest CT dated 08/25/2020.   FINDINGS: Evaluation is limited due to streak artifact caused by spinal hardware.   CTA CHEST FINDINGS   Cardiovascular: There is no cardiomegaly or pericardial effusion. Mild atherosclerotic calcification of the thoracic aorta. No aneurysmal dilatation or dissection. The origins of the great vessels of the arch appear patent as visualized. No pulmonary artery embolus identified.   Mediastinum/Nodes: No hilar or mediastinal adenopathy. The esophagus is grossly unremarkable. No mediastinal fluid collection.   Lungs/Pleura: Moderate emphysema. Postsurgical changes of right upper lobe with areas of scarring. Similar appearance of a 14 x 19 mm nodular density anterior to the right hilum, likely scarring. Attention on follow-up imaging recommended. No consolidative changes. Trace right pleural effusion. No pneumothorax. The central airways are patent.   Musculoskeletal: Osteopenia with degenerative changes of the spine. No acute osseous pathology. Thoracic spinal stimulator.   Review of the MIP images confirms the above findings.   CTA ABDOMEN AND PELVIS FINDINGS   VASCULAR   Aorta: Moderate  atherosclerotic calcification of the aorta. No aneurysmal dilatation or dissection. No periaortic fluid collection.   Celiac: Atherosclerotic calcification of the origin of the celiac artery. The celiac axis and its major branches are patent.   SMA: Patent without evidence of aneurysm, dissection, vasculitis or significant stenosis.   Renals: The renal arteries are patent.   IMA: Patent without evidence of aneurysm, dissection, vasculitis or significant stenosis.   Inflow: Moderate atherosclerotic calcification of the iliac arteries. The iliac arteries remain patent.   Veins: The IVC is grossly unremarkable as visualized. No portal venous gas.   Review of the MIP images confirms the above findings.   NON-VASCULAR   No intra-abdominal free air or free fluid.   Hepatobiliary: Fatty liver. There is dilatation of the central intrahepatic biliary trees and common bile duct. The common bile duct measures 16 mm in diameter. There is a 7 mm focus of calcification in the head of the pancreas in the region of the ampulla. Further evaluation with MRCP on a nonemergent/outpatient basis is recommended. The gallbladder is unremarkable.   Pancreas: There is dilatation of the main pancreatic duct measuring 6 mm in diameter, likely secondary to the 7 mm ampullary stone. No acute inflammatory changes.   Spleen: Normal in size without focal abnormality.   Adrenals/Urinary Tract: The adrenal glands unremarkable. There is no hydronephrosis on either side. Small bilateral renal cysts and indeterminate 2 cm hypodense lesion in the inferior pole of the right kidney which is not characterized on this CT. Further evaluation with ultrasound on a nonemergent/outpatient basis recommended. The urinary bladder is distended otherwise unremarkable.   Stomach/Bowel: There is no bowel obstruction or active inflammation. The appendix is not visualized with certainty. No inflammatory changes identified  in the right lower quadrant.   Lymphatic: No adenopathy.   Reproductive: Hysterectomy.   Other: None   Musculoskeletal: Osteopenia with degenerative changes of the spine and posterior fusion hardware. No acute osseous pathology.   Review of the MIP images confirms the above findings.   IMPRESSION: 1. No acute intrathoracic, abdominal, or pelvic pathology. No aortic dissection or aneurysm. 2. A 7 mm focus of calcification in the region of the ampulla with dilatation of the central intrahepatic biliary trees and main pancreatic duct. Further evaluation with MRCP on a nonemergent/outpatient basis is  recommended. 3. Fatty liver. 4. Small bilateral renal cysts and indeterminate 2 cm hypodense lesion in the inferior pole of the right kidney. Follow-up with ultrasound on a nonemergent/outpatient basis recommended. 5. Aortic Atherosclerosis (ICD10-I70.0) and Emphysema (ICD10-J43.9).     Electronically Signed   By: Anner Crete M.D.   On: 11/26/2020 01:09  I reviewed her chest x-ray as well as her CT from September.  There is an opacity at the right base.  Likely due to her recent pulmonary issues.  She is not currently having any fever or productive cough.  Impression: Jasmine [name] is a 65 year old woman with a history of tobacco abuse, COPD, chronic pain, asthma, reflux, hypertension, anemia, anxiety, peripheral neuropathy, pancreatitis, pneumonia, and seizures.  She had a robotic assisted right upper lobectomy in May 2022.  She had a T2, N0, stage Ib non-small cell carcinoma.  She had significant pain postoperatively.  Stage Ib non-small cell carcinoma-she never saw Dr. Delton Coombes postoperatively.  She had a lot of other issues going on.  She needs to establish follow-up with him.  He will need to do her long-term follow-up.  She is due for a CT now, but he can probably use the one from September.  Postoperative intercostal neuralgia-improved.  She does still have some chronic  pain issues there and elsewhere.  Dilated pancreatic and bile duct-recommended that she follow-up with Dr. Lajuana Ripple regarding that.  Plan: We will help her schedule appointment with Dr. Delton Coombes Return in 6 months with PA and lateral chest x-ray  I spent over 30 minutes in review of records, images, and in consultation with Jasmine [name] today.  Melrose Nakayama, MD [facility] [phone]

## 2021-01-10 ENCOUNTER — Ambulatory Visit (INDEPENDENT_AMBULATORY_CARE_PROVIDER_SITE_OTHER): Payer: Medicare Other | Admitting: Family Medicine

## 2021-01-10 ENCOUNTER — Other Ambulatory Visit: Payer: Self-pay

## 2021-01-10 ENCOUNTER — Ambulatory Visit
Admission: RE | Admit: 2021-01-10 | Discharge: 2021-01-10 | Disposition: A | Discharge status: Home / Self Care | Payer: Medicare Other | Source: Ambulatory Visit | Attending: Family Medicine | Admitting: Family Medicine

## 2021-01-10 ENCOUNTER — Encounter: Payer: Self-pay | Admitting: Family Medicine

## 2021-01-10 VITALS — BP 138/82 | HR 97 | Temp 96.9°F | Ht 60.0 in | Wt 99.0 lb

## 2021-01-10 DIAGNOSIS — Z902 Acquired absence of lung [part of]: Secondary | ICD-10-CM | POA: Diagnosis not present

## 2021-01-10 DIAGNOSIS — M67431 Ganglion, right wrist: Secondary | ICD-10-CM

## 2021-01-10 DIAGNOSIS — E876 Hypokalemia: Secondary | ICD-10-CM

## 2021-01-10 DIAGNOSIS — R233 Spontaneous ecchymoses: Secondary | ICD-10-CM

## 2021-01-10 DIAGNOSIS — N35919 Unspecified urethral stricture, male, unspecified site: Secondary | ICD-10-CM

## 2021-01-10 DIAGNOSIS — N3592 Unspecified urethral stricture, female: Secondary | ICD-10-CM | POA: Diagnosis not present

## 2021-01-10 DIAGNOSIS — D649 Anemia, unspecified: Secondary | ICD-10-CM

## 2021-01-10 DIAGNOSIS — R195 Other fecal abnormalities: Secondary | ICD-10-CM

## 2021-01-10 DIAGNOSIS — E43 Unspecified severe protein-calorie malnutrition: Secondary | ICD-10-CM

## 2021-01-10 DIAGNOSIS — Z23 Encounter for immunization: Secondary | ICD-10-CM | POA: Diagnosis not present

## 2021-01-10 DIAGNOSIS — N3281 Overactive bladder: Secondary | ICD-10-CM

## 2021-01-10 DIAGNOSIS — Z1231 Encounter for screening mammogram for malignant neoplasm of breast: Secondary | ICD-10-CM

## 2021-01-10 MED ORDER — MIRABEGRON ER 25 MG PO TB24: 25.0000 mg | ORAL_TABLET | Freq: Every day | ORAL | 0 refills | Status: DC

## 2021-01-10 NOTE — Progress Notes (Signed)
Subjective: CC: 62-month follow-up PCP: Jasmine Norlander, DO BMW:UXLKGM C Jasmine [name] is a 65 y.o. female presenting to clinic today for:  1.  Urethral irritation Patient reports urethral pain.  She notices this whenever she has to go to the bathroom really bad.  Once she is able to urinate the pain totally resolves.  No reports of hematuria  2.  Easy bruising Patient reports ongoing easy bruisability and easy skin tearing.  She wants to know if there is anything else that she can do.  She is already on high-calorie boost/Ensure and has gained about 10 pounds as a result.  She is not taking any aspirin or other medications that would promote bruising or bleeding.  She has follow-up with Jasmine [name] soon for her anemia and cancer.  3.  Loose stools Patient reports ongoing soft/loose stools despite 2 rounds of treatment for C. difficile.  Continues to have a very foul odor and she wonders if this is normal.  She reports no fevers, abdominal pain or hematochezia or melena.  She is followed by Jasmine [name] for her colonoscopies but has not yet reached out to them for recommendations.  She is taking a probiotic and eating yogurt  4.  Wrist cyst Patient reports what she thinks is a ganglion cyst on the radial aspect of the right wrist.  Denies any sensory changes, burning or weakness.  She has had similar that required removal on the left side.  ROS: Per HPI  Allergies  Allergen Reactions   Lyrica [Pregabalin] Other (See Comments)    EXTREME SHAKING/TREMBLING   Darvon [Propoxyphene]     UNSPECIFIED REACTION    Gabapentin     Extreme shaking and trembling    Augmentin [Amoxicillin-Pot Clavulanate] Nausea And Vomiting    Has patient had a PCN reaction causing immediate rash, facial/tongue/throat swelling, SOB or lightheadedness with hypotension:no Has patient had a PCN reaction causing severe rash involving mucus membranes or skin necrosis:No Has patient had a PCN reaction that required  hospitalization:No Has patient had a PCN reaction occurring within the last 10 years:Yes--ONLY N/V If all of the above answers are "NO", then may proceed with Cephalosporin use.    Erythromycin Itching and Rash        Vibramycin [Doxycycline Calcium] Itching and Rash    "[place] "   Past Medical History:  Diagnosis Date   Allergy    Anemia    Anxiety    Arthritis    Asthma    Carpal tunnel syndrome    Chronic back pain    Cigarette smoker 0/12/2723   Complication of anesthesia    in the past has had N/V, the last few surgeries she has not been   COPD (chronic obstructive pulmonary disease) ([place])    Easy bruising 07/10/2016   GERD (gastroesophageal reflux disease)    Headache    Heart murmur 2005   never had any problems   History of blood transfusion 2018   History of kidney stones    Hypertension    Hypoglycemia    Hypoglycemia    Iron deficiency anemia 07/11/2016   Neuropathy    both arms   Normocytic anemia 07/29/2015   Pancreatitis    Pneumonia    PONV (postoperative nausea and vomiting)    Postoperative anemia due to acute blood loss 11/15/2015   R lung cancer    Sciatica    Seizures ([place])    as a child when she would have an asthma attack. none  as an adult   Shortness of breath dyspnea     Current Outpatient Medications:    albuterol (VENTOLIN HFA) 108 (90 Base) MCG/ACT inhaler, Inhale into the lungs every 6 (six) hours as needed for wheezing or shortness of breath., Disp: , Rfl:    amLODipine (NORVASC) 10 MG tablet, Take 1 tablet (10 mg total) by mouth daily., Disp: 90 tablet, Rfl: 3   atorvastatin (LIPITOR) 40 MG tablet, Take 1 tablet (40 mg total) by mouth daily., Disp: 90 tablet, Rfl: 3   budesonide-formoterol (SYMBICORT) 160-4.5 MCG/ACT inhaler, USE 2 INHALATIONS TWICE A DAY, Disp: 30 g, Rfl: 0   Cholecalciferol 125 MCG (5000 UT) capsule, Take 5,000 Units by mouth daily., Disp: , Rfl:    dextromethorphan (DELSYM) 30 MG/5ML liquid, Take 5 mLs (30 mg total)  by mouth every 8 (eight) hours as needed for cough. (Patient not taking: Reported on 11/26/2020), Disp: 148 mL, Rfl: 0   dextromethorphan-guaiFENesin (MUCINEX DM) 30-600 MG 12hr tablet, Take 1 tablet by mouth 2 (two) times daily., Disp: , Rfl:    diclofenac sodium (VOLTAREN) 1 % GEL, Apply 1 application topically daily as needed for muscle pain., Disp: , Rfl:    DULoxetine (CYMBALTA) 30 MG capsule, Take 3 capsules (90 mg total) by mouth daily., Disp: 270 capsule, Rfl: 4   ipratropium-albuterol (DUONEB) 0.5-2.5 (3) MG/3ML SOLN, Inhale 3 mLs into the lungs 4 (four) times daily as needed (for shortness of breath/wheezing). *May use as needed, Disp: , Rfl:    Iron Polysacch Cmplx-B12-FA (POLY-IRON 150 FORTE) 150-0.025-1 MG CAPS, Take 1 capsule by mouth 2 (two) times daily. (Patient not taking: Reported on 11/26/2020), Disp: 60 capsule, Rfl: 2   L-Methylfolate-Algae-B12-B6 (METANX) 3-90.314-2-35 MG CAPS, TAKE 1 TABLET BY MOUTH TWO TIMES A DAY (Patient taking differently: Take 1 tablet by mouth in the morning and at bedtime.), Disp: 180 capsule, Rfl: 4   magnesium oxide (MAG-OX) 400 (241.3 Mg) MG tablet, Take 400 mg by mouth 2 (two) times daily., Disp: , Rfl:    nicotine (NICODERM CQ) 14 mg/24hr patch, Place 1 patch (14 mg total) onto the skin daily., Disp: 30 patch, Rfl: 0   Nutritional Supplements (ENSURE HIGH PROTEIN) LIQD, Drink 1 can 3 times daily., Disp: 21330 mL, Rfl: PRN   omeprazole (PRILOSEC) 40 MG capsule, Take 1 capsule (40 mg total) by mouth 2 (two) times daily. Take 1 capsule 30 mins before breakfast and evening meal., Disp: 90 capsule, Rfl: 3   ondansetron (ZOFRAN ODT) 4 MG disintegrating tablet, Take 1 tablet (4 mg total) by mouth every 8 (eight) hours as needed for nausea or vomiting., Disp: 20 tablet, Rfl: 0   oxyCODONE-acetaminophen (PERCOCET) 10-325 MG tablet, Take 1 tablet by mouth 5 (five) times daily., Disp: , Rfl:    OXYCONTIN 15 MG 12 hr tablet, Take 15 mg by mouth every 12 (twelve)  hours., Disp: , Rfl:    OXYGEN, Inhale 3 L into the lungs See admin instructions. continuous, Disp: , Rfl:    SPIRIVA RESPIMAT 2.5 MCG/ACT AERS, USE 2 INHALATIONS DAILY (Patient taking differently: Inhale 2 puffs into the lungs daily.), Disp: 12 g, Rfl: 3   tizanidine (ZANAFLEX) 6 MG capsule, Take 6 mg by mouth 3 (three) times daily., Disp: , Rfl:    traZODone (DESYREL) 150 MG tablet, TAKE 1 TO 2 TABLETS AT BEDTIME (Patient taking differently: Take 150-300 mg by mouth at bedtime.), Disp: 180 tablet, Rfl: 3 Social History   Socioeconomic History   Marital status: Married  Spouse name: [name]   Number of children: 1   Years of education: Not on file   Highest education level: Some college, no degree  Occupational History   Not on file  Tobacco Use   Smoking status: Every Day    Packs/day: 0.25    Years: 35.00    Pack years: 8.75    Types: Cigarettes   Smokeless tobacco: Never   Tobacco comments:    trying to quit  Vaping Use   Vaping Use: Never used  Substance and Sexual Activity   Alcohol use: Yes    Alcohol/week: 2.0 standard drinks    Types: 2 Glasses of wine per week    Comment: occasionally   Drug use: No   Sexual activity: Yes    Birth control/protection: None  Other Topics Concern   Not on file  Social History Narrative   Retired, 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 1 son, deceased. 2 pet dogs. Enjoys watching TV.   Social Determinants of Health   Financial Resource Strain: Not on file  Food Insecurity: Not on file  Transportation Needs: Not on file  Physical Activity: Not on file  Stress: Not on file  Social Connections: Not on file  Intimate Partner Violence: Not At Risk   Fear of Current or Ex-Partner: No   Emotionally Abused: No   Physically Abused: No   Sexually Abused: No   Family History  Problem Relation Age of Onset   Heart disease Mother    Diabetes Mother    Hypertension Mother    Hyperlipidemia Mother    COPD Mother         smoked   Cancer Mother        bile duct   COPD Father    Diabetes Father    Heart disease Father    Cancer Father        throat   Dementia Father    Asthma Son    Hypertension Sister    Hyperlipidemia Sister    Hypertension Brother    Hyperlipidemia Brother    Hypertension Brother    Hyperlipidemia Brother    Emphysema Maternal Grandmother        smoked   Colon cancer Neg Hx    Colon polyps Neg Hx     Objective: Office vital signs reviewed. BP 138/82   Pulse 97   Temp (!) 96.9 F (36.1 C) (Temporal)   Ht 5' (1.524 m)   Wt 99 lb (44.9 kg)   SpO2 95%   BMI 19.33 kg/m   Physical Examination:  General: Awake, alert, appears healthier than previous checkup, No acute distress HEENT: Normal; sclera white Cardio: regular rate and rhythm, S1S2 heard, no murmurs appreciated Pulm: Globally decreased breath sounds.  She has normal work of breathing on 3 L supplemental oxygen via nasal cannula. Extremities: BB sized well-circumscribed, mobile cyst appreciated on the distal radial aspect of the right wrist.  Consistent with ganglion cyst  Assessment/ Plan: 65 y.o. female   S/P lobectomy of lung  Protein-calorie malnutrition, severe - Plan: CANCELED: Anemia Profile B  Hypokalemia - Plan: CANCELED: Anemia Profile B, CANCELED: Basic Metabolic Panel  Anemia, unspecified type - Plan: CANCELED: Anemia Profile B  Overactive bladder - Plan: mirabegron ER (MYRBETRIQ) 25 MG TB24 tablet  Urethral spasm - Plan: mirabegron ER (MYRBETRIQ) 25 MG TB24 tablet  Abnormal stools - Plan: Ambulatory referral to Gastroenterology  Ganglion of right wrist  Easy bruising  Need for  immunization against influenza - Plan: Flu Vaccine QUAD High Dose(Fluad)  She seems to be doing fairly well after her lobectomy.  Keep appointment with Jasmine [name] and cardiothoracic surgery as scheduled  I think that she is doing well from a weight gain standpoint.  She has put on weight since her last visit.   Encourage continued nutrition.  I considered obtaining labs today but states she will be seeing her oncologist/hematologist in about 3 days I will defer any laboratory draws to them  I am going to place her on a trial of Myrbetriq for what sounds like urethral spasm, possibly related to overactive bladder.  2-week sample was provided and she will contact me if this is helpful.  Referral to gastroenterology placed for ongoing abnormal stools.  Will CC Jasmine [name] and I am glad obtain any labs or studies prior to that visit if he would like  Ganglion cyst of the right wrist is asymptomatic and therefore no treatment planned.  Influenza vaccination administered  No orders of the defined types were placed in this encounter.  No orders of the defined types were placed in this encounter.    Jasmine Norlander, DO [place] [phone]

## 2021-01-10 NOTE — Patient Instructions (Signed)
Myrbetriq added as a sample for bladder issues.  You have enough for 2 weeks.  If you find this to be helpful, please call me let me know and I will send it to your pharmacy I referred you back to Dr. Gala Romney for assessment of your ongoing stool issues Should the cyst become a problem on the wrist, please let me know and I will refer you to have this drained

## 2021-01-11 ENCOUNTER — Encounter: Payer: Self-pay | Admitting: Gastroenterology

## 2021-01-12 NOTE — Progress Notes (Signed)
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 65 y.o. 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 Roxan Hockey.  Today she reports feeling well. She reports SOB and chronic back pain. She underwent right upper lobectomy on 07/29/2020 with Dr.Hendrickson. She has been on continuous O2 since the surgery, and she reports pain at the surgery site. Prior to surgery she used O2 as needed at night. She has a history of anemia for which she takes an iron tablet twice daily. She denies black stools, hematuria, and hematochezia. She reports decreased energy, and she denies ice pica. She denies history of blood transfusions. She drinks Ensure 3 times daily. She denies ankle swellings. 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 She currently smokes 1/4 ppd; prior to her surgery she smoked 2-2.5 ppd for 47 years. Her mother had metastatic bile duct cancer, her father had throat cancer, her maternal grandfather had brain cancer, her brother had kidney cancer, and her sister had thyroid and kidney cancer.   MEDICAL HISTORY:  Past Medical History:  Diagnosis Date   Allergy    Anemia    Anxiety    Arthritis    Asthma    Carpal tunnel syndrome    Chronic back pain    Cigarette smoker 3/35/4562   Complication of anesthesia    in the past has had N/V, the last few surgeries she  has not been   COPD (chronic obstructive pulmonary disease) ([place])    Easy bruising 07/10/2016   GERD (gastroesophageal reflux disease)    Headache    Heart murmur 2005   never had any problems   History of blood transfusion 2018   History of kidney stones    Hypertension    Hypoglycemia    Hypoglycemia    Iron deficiency anemia 07/11/2016   Neuropathy    both arms   Normocytic anemia 07/29/2015   Pancreatitis    Pneumonia    PONV (postoperative nausea and vomiting)    Postoperative anemia due to acute blood loss 11/15/2015   R lung cancer    Sciatica    Seizures ([place])    as a child when she would have an asthma attack. none as an adult   Shortness of breath dyspnea     SURGICAL HISTORY: Past Surgical History:  Procedure Laterality Date   ABDOMINAL HYSTERECTOMY  5638   APPLICATION OF ROBOTIC ASSISTANCE FOR SPINAL PROCEDURE N/A 09/05/2016   Procedure: APPLICATION OF ROBOTIC ASSISTANCE FOR SPINAL PROCEDURE;  Surgeon: Consuella Lose, MD;  Location: [facility];  Service: Neurosurgery;  Laterality: N/A;   BACK SURGERY     BIOPSY  06/14/2020   Procedure: BIOPSY;  Surgeon: Daneil Dolin, MD;  [facility];  Service: Endoscopy;;   [place]   COLONOSCOPY     unsure last   COLONOSCOPY WITH PROPOFOL N/A 06/14/2020   Procedure: COLONOSCOPY WITH  PROPOFOL;  Surgeon: Daneil Dolin, MD;  [facility];  Service: Endoscopy;  Laterality: N/A;  PM   cyst removal face     disk repair     neck and lumbar region   ESOPHAGOGASTRODUODENOSCOPY (EGD) WITH PROPOFOL N/A 06/14/2020   Procedure: ESOPHAGOGASTRODUODENOSCOPY (EGD) WITH PROPOFOL;  Surgeon: Daneil Dolin, MD;  [facility];  Service: Endoscopy;  Laterality: N/A;   FOOT SURGERY Bilateral    to file bones down    HARDWARE REMOVAL N/A 09/05/2016   Procedure: HARDWARE REMOVAL AND REPLACEMENT OF LUMBAR FIVE SCREWS;  Surgeon: Consuella Lose, MD;  Location: [facility];  Service:  Neurosurgery;  Laterality: N/A;   IMPLANTATION / PLACEMENT EPIDURAL NEUROSTIMULATOR ELECTRODES     INTERCOSTAL NERVE BLOCK Right 07/29/2020   Procedure: INTERCOSTAL NERVE BLOCK;  Surgeon: Melrose Nakayama, MD;  Location: [facility];  Service: Thoracic;  Laterality: Right;   LAMINECTOMY     2006   lobectomy Right 07/2020   LUMBAR FUSION     LYMPH NODE BIOPSY Right 07/29/2020   Procedure: LYMPH NODE BIOPSY;  Surgeon: Melrose Nakayama, MD;  Location: [facility];  Service: Thoracic;  Laterality: Right;   POLYPECTOMY  06/14/2020   Procedure: POLYPECTOMY INTESTINAL;  Surgeon: Daneil Dolin, MD;  [facility];  Service: Endoscopy;;   SPINAL CORD STIMULATOR BATTERY EXCHANGE     SPINAL CORD STIMULATOR INSERTION N/A 07/05/2016   Procedure: REPLACEMENT OF LUMBAR SPINAL CORD STIMULATOR BATTERY;  Surgeon: Newman Pies, MD;  Location: [facility];  Service: Neurosurgery;  Laterality: N/A;   TONSILLECTOMY      SOCIAL HISTORY: Social History   Socioeconomic History   Marital status: Married    Spouse name: [name]   Number of children: 1   Years of education: Not on file   Highest education level: Some college, no degree  Occupational History   Not on file  Tobacco Use   Smoking status: Every Day    Packs/day: 0.25    Years: 35.00    Pack years: 8.75    Types: Cigarettes   Smokeless tobacco: Never   Tobacco comments:    trying to quit  Vaping Use   Vaping Use: Never used  Substance and Sexual Activity   Alcohol use: Yes    Alcohol/week: 2.0 standard drinks    Types: 2 Glasses of wine per week    Comment: occasionally   Drug use: No   Sexual activity: Yes    Birth control/protection: None  Other Topics Concern   Not on file  Social History Narrative   Retired, 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 1 son, deceased. 2 pet dogs. Enjoys watching TV.   Social Determinants of Health   Financial Resource Strain: Not on file  Food Insecurity: Not on file   Transportation Needs: Not on file  Physical Activity: Not on file  Stress: Not on file  Social Connections: Not on file  Intimate Partner Violence: Not At Risk   Fear of Current or Ex-Partner: No   Emotionally Abused: No   Physically Abused: No   Sexually Abused: No    FAMILY HISTORY: Family History  Problem Relation Age of Onset   Heart disease Mother    Diabetes Mother    Hypertension Mother    Hyperlipidemia Mother    COPD Mother        smoked   Cancer Mother        bile duct  COPD Father    Diabetes Father    Heart disease Father    Cancer Father        throat   Dementia Father    Asthma Son    Hypertension Sister    Hyperlipidemia Sister    Hypertension Brother    Hyperlipidemia Brother    Hypertension Brother    Hyperlipidemia Brother    Emphysema Maternal Grandmother        smoked   Colon cancer Neg Hx    Colon polyps Neg Hx     ALLERGIES:  is allergic to lyrica [pregabalin], darvon [propoxyphene], gabapentin, augmentin [amoxicillin-pot clavulanate], erythromycin, and vibramycin [doxycycline calcium].  MEDICATIONS:  Current Outpatient Medications  Medication Sig Dispense Refill   amLODipine (NORVASC) 10 MG tablet Take 1 tablet (10 mg total) by mouth daily. 90 tablet 3   atorvastatin (LIPITOR) 40 MG tablet Take 1 tablet (40 mg total) by mouth daily. 90 tablet 3   budesonide-formoterol (SYMBICORT) 160-4.5 MCG/ACT inhaler USE 2 INHALATIONS TWICE A DAY 30 g 0   Cholecalciferol 125 MCG (5000 UT) capsule Take 5,000 Units by mouth daily.     dextromethorphan-guaiFENesin (MUCINEX DM) 30-600 MG 12hr tablet Take 1 tablet by mouth 2 (two) times daily.     DULoxetine (CYMBALTA) 30 MG capsule Take 3 capsules (90 mg total) by mouth daily. 270 capsule 4   Iron Polysacch Cmplx-B12-FA (POLY-IRON 150 FORTE) 150-0.025-1 MG CAPS Take 1 capsule by mouth 2 (two) times daily. 60 capsule 2   L-Methylfolate-Algae-B12-B6 (METANX) 3-90.314-2-35 MG CAPS TAKE 1 TABLET BY MOUTH TWO  TIMES A DAY (Patient taking differently: Take 1 tablet by mouth in the morning and at bedtime.) 180 capsule 4   magnesium oxide (MAG-OX) 400 (241.3 Mg) MG tablet Take 400 mg by mouth 2 (two) times daily.     mirabegron ER (MYRBETRIQ) 25 MG TB24 tablet Take 1 tablet (25 mg total) by mouth daily. 14 tablet 0   nicotine (NICODERM CQ) 14 mg/24hr patch Place 1 patch (14 mg total) onto the skin daily. 30 patch 0   Nutritional Supplements (ENSURE HIGH PROTEIN) LIQD Drink 1 can 3 times daily. 21330 mL PRN   omeprazole (PRILOSEC) 40 MG capsule Take 1 capsule (40 mg total) by mouth 2 (two) times daily. Take 1 capsule 30 mins before breakfast and evening meal. 90 capsule 3   oxyCODONE-acetaminophen (PERCOCET) 10-325 MG tablet Take 1 tablet by mouth 5 (five) times daily.     OXYCONTIN 15 MG 12 hr tablet Take 15 mg by mouth every 12 (twelve) hours.     OXYGEN Inhale 3 L into the lungs See admin instructions. continuous     SPIRIVA RESPIMAT 2.5 MCG/ACT AERS USE 2 INHALATIONS DAILY (Patient taking differently: Inhale 2 puffs into the lungs daily.) 12 g 3   tizanidine (ZANAFLEX) 6 MG capsule Take 6 mg by mouth 3 (three) times daily.     traZODone (DESYREL) 150 MG tablet TAKE 1 TO 2 TABLETS AT BEDTIME (Patient taking differently: Take 150-300 mg by mouth at bedtime.) 180 tablet 3   albuterol (VENTOLIN HFA) 108 (90 Base) MCG/ACT inhaler Inhale into the lungs every 6 (six) hours as needed for wheezing or shortness of breath. (Patient not taking: Reported on 01/13/2021)     diclofenac sodium (VOLTAREN) 1 % GEL Apply 1 application topically daily as needed for muscle pain. (Patient not taking: Reported on 01/13/2021)     ipratropium-albuterol (DUONEB) 0.5-2.5 (3) MG/3ML SOLN Inhale 3 mLs into the lungs 4 (four)  times daily as needed (for shortness of breath/wheezing). *May use as needed (Patient not taking: Reported on 01/13/2021)     ondansetron (ZOFRAN ODT) 4 MG disintegrating tablet Take 1 tablet (4 mg total) by mouth  every 8 (eight) hours as needed for nausea or vomiting. (Patient not taking: Reported on 01/13/2021) 20 tablet 0   No current facility-administered medications for this visit.    REVIEW OF SYSTEMS:   Review of Systems  Constitutional:  Positive for fatigue (depleted). Negative for appetite change (60%).  HENT:   Positive for trouble swallowing.   Respiratory:  Positive for cough and shortness of breath.   Cardiovascular:  Negative for leg swelling.  Gastrointestinal:  Negative for blood in stool.  Genitourinary:  Negative for hematuria.   Neurological:  Positive for numbness (tingling R side).  All other systems reviewed and are negative.   PHYSICAL EXAMINATION: ECOG PERFORMANCE STATUS: 1 - Symptomatic but completely ambulatory  Vitals:   01/13/21 1321  BP: (!) 153/75  Pulse: (!) 106  Resp: 19  Temp: (!) 96.9 F (36.1 C)  SpO2: 100%   Filed Weights   01/13/21 1321  Weight: 102 lb 3.2 oz (46.4 kg)   Physical Exam Vitals reviewed.  Constitutional:      Appearance: Normal appearance.     Interventions: Nasal cannula in place.  Cardiovascular:     Rate and Rhythm: Normal rate and regular rhythm.     Pulses: Normal pulses.     Heart sounds: Normal heart sounds.  Pulmonary:     Effort: Pulmonary effort is normal.     Breath sounds: Normal breath sounds.  Abdominal:     Palpations: Abdomen is soft. There is no mass.     Tenderness: There is no abdominal tenderness.  Musculoskeletal:     Right lower leg: No edema.     Left lower leg: No edema.  Lymphadenopathy:     Cervical: No cervical adenopathy.     Right cervical: No superficial cervical adenopathy.    Left cervical: No superficial cervical adenopathy.  Neurological:     General: No focal deficit present.     Mental Status: She is alert and oriented to person, place, and time.  Psychiatric:        Mood and Affect: Mood normal.        Behavior: Behavior normal.     LABORATORY DATA:  I have reviewed the data  as listed CBC Latest Ref Rng & Units 01/13/2021 11/28/2020 11/27/2020  WBC 4.0 - 10.5 K/uL 6.6 4.7 5.6  Hemoglobin 12.0 - 15.0 g/dL 10.6(L) 11.5(L) 12.4  Hematocrit 36.0 - 46.0 % 34.8(L) 36.7 40.1  Platelets 150 - 400 K/uL 218 229 268   CMP Latest Ref Rng & Units 01/13/2021 11/28/2020 11/27/2020  Glucose 70 - 99 mg/dL 101(H) 131(H) 115(H)  BUN 8 - 23 mg/dL '14 11 11  ' Creatinine 0.44 - 1.00 mg/dL 0.46 0.46 0.47  Sodium 135 - 145 mmol/L 136 135 134(L)  Potassium 3.5 - 5.1 mmol/L 4.1 3.3(L) 3.7  Chloride 98 - 111 mmol/L 104 96(L) 93(L)  CO2 22 - 32 mmol/L '26 31 30  ' Calcium 8.9 - 10.3 mg/dL 8.9 9.2 9.5  Total Protein 6.5 - 8.1 g/dL 7.1 - 6.7  Total Bilirubin 0.3 - 1.2 mg/dL 0.6 - 0.7  Alkaline Phos 38 - 126 U/L 88 - 79  AST 15 - 41 U/L 22 - 22  ALT 0 - 44 U/L 16 - 15    RADIOGRAPHIC STUDIES: I  have personally reviewed the radiological images as listed and agreed with the findings in the report. DG Chest 2 View  Result Date: 01/04/2021 CLINICAL DATA:  Atypical chest pain. EXAM: CHEST - 2 VIEW COMPARISON:  11/25/2020. FINDINGS: Heart size and mediastinal contours are stable. Postop change within the right suprahilar region with suture chain is again noted and appears stable from previous exam. There is asymmetric right lung volume loss. Blunting of the right costophrenic angle is unchanged. New opacity within the right lung base is noted which may represent atelectasis or pneumonia. Left lung appears clear. Dorsal column stimulator is again noted terminating in the midthoracic spine. IMPRESSION: New right lung base opacity which may represent atelectasis or pneumonia. Electronically Signed   By: Kerby Moors M.D.   On: 01/04/2021 14:22   MM 3D SCREEN BREAST BILATERAL  Result Date: 01/12/2021 CLINICAL DATA:  Screening. EXAM: DIGITAL SCREENING BILATERAL MAMMOGRAM WITH TOMOSYNTHESIS AND CAD TECHNIQUE: Bilateral screening digital craniocaudal and mediolateral oblique mammograms were obtained.  Bilateral screening digital breast tomosynthesis was performed. The images were evaluated with computer-aided detection. COMPARISON:  Previous exam(s). ACR Breast Density Category d: The breast tissue is extremely dense, which lowers the sensitivity of mammography FINDINGS: There are no findings suspicious for malignancy. IMPRESSION: No mammographic evidence of malignancy. A result letter of this screening mammogram will be mailed directly to the patient. RECOMMENDATION: Screening mammogram in one year. (Code:SM-B-01Y) BI-RADS CATEGORY  1: Negative. Electronically Signed   By: Everlean Alstrom M.D.   On: 01/12/2021 15:51    ASSESSMENT:  Stage I T2N0 right squamous cell lung cancer: - Right upper lobectomy and excision of mediastinal and hilar lymph nodes on 07/29/2020 - Pathology shows 2.4 cm squamous cell carcinoma, no lymphovascular invasion, margins negative, 0/16 lymph nodes involved, PT2APN0. - CT chest on 11/26/2020 with postsurgical changes in the right upper lobe.  Similar appearance of 14 x 19 mm nodular density anterior to the right hilum, likely scarring.  This was present on the CT angiogram from 08/25/2020 but not on the presurgical CT scan on 07/21/2020.   Social/family history: - She worked in Press photographer prior to retirement. - She is currently smoking 15 cigarettes/day since surgery.  Prior to that she used to smoke 2 to 2-1/2 packs/day for past 20 years. - Mother had bile duct cancer.  Father had throat cancer.  Maternal great grandfather had brain cancer.  Brother had kidney cancer.  Sister had thyroid and kidney cancer.   PLAN:  Stage I T2N0 right squamous cell lung cancer: - We have discussed pathology report and recent CT scan from 11/26/2020 and reviewed prior CT scan images. - No high risk features.  No adjuvant chemotherapy required.  We discussed surveillance plan for stage I lung cancer. - As her tumor is squamous histology, likelihood of EGFR mutation is very low. - Recommend  CT chest with contrast in 6 weeks and follow-up in the clinic.  2.  Macrocytic anemia: - She is taking iron tablet twice daily.  Denies any bleeding per rectum or melena. - No ice pica.  Reports some decrease in energy levels.  Last Feraheme in 2018 May. - We will check CBC, ferritin, iron panel, Y48, folic acid, methylmalonic acid, SPEP, LDH and reticulocyte count today.    All questions were answered. The patient knows to call the clinic with any problems, questions or concerns.   Derek Jack, MD, 01/13/21 4:43 PM  [facility] [phone]   I, Thana Ates, am acting as  a scribe for Dr. Derek Jack.  I, Derek Jack MD, have reviewed the above documentation for accuracy and completeness, and I agree with the above.

## 2021-01-13 ENCOUNTER — Encounter (HOSPITAL_COMMUNITY): Payer: Self-pay | Admitting: Hematology

## 2021-01-13 ENCOUNTER — Inpatient Hospital Stay (HOSPITAL_COMMUNITY): Payer: Medicare Other

## 2021-01-13 ENCOUNTER — Other Ambulatory Visit: Payer: Self-pay

## 2021-01-13 ENCOUNTER — Inpatient Hospital Stay (HOSPITAL_COMMUNITY): Payer: Medicare Other | Attending: Hematology | Admitting: Hematology

## 2021-01-13 DIAGNOSIS — Z79899 Other long term (current) drug therapy: Secondary | ICD-10-CM | POA: Diagnosis not present

## 2021-01-13 DIAGNOSIS — Z85118 Personal history of other malignant neoplasm of bronchus and lung: Secondary | ICD-10-CM | POA: Diagnosis not present

## 2021-01-13 DIAGNOSIS — C3491 Malignant neoplasm of unspecified part of right bronchus or lung: Secondary | ICD-10-CM

## 2021-01-13 DIAGNOSIS — D539 Nutritional anemia, unspecified: Secondary | ICD-10-CM

## 2021-01-13 DIAGNOSIS — F17210 Nicotine dependence, cigarettes, uncomplicated: Secondary | ICD-10-CM | POA: Insufficient documentation

## 2021-01-13 LAB — COMPREHENSIVE METABOLIC PANEL
ALT: 16 U/L (ref 0–44)
AST: 22 U/L (ref 15–41)
Albumin: 3.6 g/dL (ref 3.5–5.0)
Alkaline Phosphatase: 88 U/L (ref 38–126)
Anion gap: 6 (ref 5–15)
BUN: 14 mg/dL (ref 8–23)
CO2: 26 mmol/L (ref 22–32)
Calcium: 8.9 mg/dL (ref 8.9–10.3)
Chloride: 104 mmol/L (ref 98–111)
Creatinine, Ser: 0.46 mg/dL (ref 0.44–1.00)
GFR, Estimated: >60 mL/min (ref >60)
Glucose, Bld: 101 mg/dL — ABNORMAL HIGH (ref 70–99)
Potassium: 4.1 mmol/L (ref 3.5–5.1)
Sodium: 136 mmol/L (ref 135–145)
Total Bilirubin: 0.6 mg/dL (ref 0.3–1.2)
Total Protein: 7.1 g/dL (ref 6.5–8.1)

## 2021-01-13 LAB — CBC WITH DIFFERENTIAL/PLATELET
Abs Immature Granulocytes: 0.03 10*3/uL (ref 0.00–0.07)
Basophils Absolute: 0 10*3/uL (ref 0.0–0.1)
Basophils Relative: 1 %
Eosinophils Absolute: 0.1 10*3/uL (ref 0.0–0.5)
Eosinophils Relative: 1 %
HCT: 34.8 % — ABNORMAL LOW (ref 36.0–46.0)
Hemoglobin: 10.6 g/dL — ABNORMAL LOW (ref 12.0–15.0)
Immature Granulocytes: 1 %
Lymphocytes Relative: 18 %
Lymphs Abs: 1.2 10*3/uL (ref 0.7–4.0)
MCH: 31 pg (ref 26.0–34.0)
MCHC: 30.5 g/dL (ref 30.0–36.0)
MCV: 101.8 fL — ABNORMAL HIGH (ref 80.0–100.0)
Monocytes Absolute: 0.7 10*3/uL (ref 0.1–1.0)
Monocytes Relative: 11 %
Neutro Abs: 4.6 10*3/uL (ref 1.7–7.7)
Neutrophils Relative %: 68 %
Platelets: 218 10*3/uL (ref 150–400)
RBC: 3.42 MIL/uL — ABNORMAL LOW (ref 3.87–5.11)
RDW: 15 % (ref 11.5–15.5)
WBC: 6.6 10*3/uL (ref 4.0–10.5)
nRBC: 0 % (ref 0.0–0.2)

## 2021-01-13 LAB — RETICULOCYTES
Immature Retic Fract: 15.8 % (ref 2.3–15.9)
RBC.: 3.24 MIL/uL — ABNORMAL LOW (ref 3.87–5.11)
Retic Count, Absolute: 50.9 10*3/uL (ref 19.0–186.0)
Retic Ct Pct: 1.6 % (ref 0.4–3.1)

## 2021-01-13 LAB — VITAMIN B12: Vitamin B-12: 2728 pg/mL — ABNORMAL HIGH (ref 180–914)

## 2021-01-13 LAB — IRON AND TIBC
Iron: 33 ug/dL (ref 28–170)
Saturation Ratios: 6 % — ABNORMAL LOW (ref 10.4–31.8)
TIBC: 544 ug/dL — ABNORMAL HIGH (ref 250–450)
UIBC: 511 ug/dL

## 2021-01-13 LAB — LACTATE DEHYDROGENASE: LDH: 155 U/L (ref 98–192)

## 2021-01-13 LAB — FERRITIN: Ferritin: 8 ng/mL — ABNORMAL LOW (ref 11–307)

## 2021-01-13 LAB — FOLATE: Folate: 30.8 ng/mL (ref >5.9)

## 2021-01-13 NOTE — Patient Instructions (Addendum)
Jasmine [name] at [facility] Discharge Instructions  You were seen and examined today by Dr. Delton Coombes. Dr. Delton Coombes is a medical oncologist, meaning that he specializes in cancer. Dr. Delton Coombes discussed your past medical history, family history of cancers, and the events that led to you being here today.  You have been diagnosed with Stage I squamous cell carcinoma of the lung. This has been completely surgically removed and there is no need for chemotherapy or radiation.  Dr. Delton Coombes has recommended rechecking your anemia labs today - just to see how your anemia is doing right now. Dr. Delton Coombes also recommends routine scans given your recent cancer diagnosis. Your next scan will be 6 months from your last.  Follow-up as scheduled.   Thank you for choosing [facility] at [facility] to provide your oncology and hematology care.  To afford each patient quality time with our provider, please arrive at least 15 minutes before your scheduled appointment time.   If you have a lab appointment with the [facility] please come in thru the Main Entrance and check in at the main information desk.  You need to re-schedule your appointment should you arrive 10 or more minutes late.  We strive to give you quality time with our providers, and arriving late affects you and other patients whose appointments are after yours.  Also, if you no show three or more times for appointments you may be dismissed from the clinic at the providers discretion.     Again, thank you for choosing [facility].  Our hope is that these requests will decrease the amount of time that you wait before being seen by our physicians.       _____________________________________________________________  Should you have questions after your visit to [facility], please contact our office at [phone] and follow the prompts.  Our office hours are 8:00 a.m.  and 4:30 p.m. Monday - Friday.  Please note that voicemails left after 4:00 p.m. may not be returned until the following business day.  We are closed weekends and major holidays.  You do have access to a nurse 24-7, just call the main number to the clinic [phone] and do not press any options, hold on the line and a nurse will answer the phone.    For prescription refill requests, have your pharmacy contact our office and allow 72 hours.    Due to Covid, you will need to wear a mask upon entering the hospital. If you do not have a mask, a mask will be given to you at the Main Entrance upon arrival. For doctor visits, patients may have 1 support person age 13 or older with them. For treatment visits, patients can not have anyone with them due to social distancing guidelines and our immunocompromised population.

## 2021-01-15 ENCOUNTER — Other Ambulatory Visit: Payer: Self-pay | Admitting: Family Medicine

## 2021-01-15 DIAGNOSIS — J42 Unspecified chronic bronchitis: Secondary | ICD-10-CM

## 2021-01-15 LAB — COPPER, SERUM: Copper: 116 ug/dL (ref 80–158)

## 2021-01-16 LAB — PROTEIN ELECTROPHORESIS, SERUM
A/G Ratio: 1.1 (ref 0.7–1.7)
Albumin ELP: 3.4 g/dL (ref 2.9–4.4)
Alpha-1-Globulin: 0.3 g/dL (ref 0.0–0.4)
Alpha-2-Globulin: 0.8 g/dL (ref 0.4–1.0)
Beta Globulin: 1.2 g/dL (ref 0.7–1.3)
Gamma Globulin: 0.9 g/dL (ref 0.4–1.8)
Globulin, Total: 3.1 g/dL (ref 2.2–3.9)
Total Protein ELP: 6.5 g/dL (ref 6.0–8.5)

## 2021-01-17 LAB — METHYLMALONIC ACID, SERUM: Methylmalonic Acid, Quantitative: 174 nmol/L (ref 0–378)

## 2021-01-18 ENCOUNTER — Other Ambulatory Visit: Payer: Self-pay | Admitting: Nurse Practitioner

## 2021-02-02 ENCOUNTER — Ambulatory Visit: Payer: Medicare Other | Admitting: Internal Medicine

## 2021-02-02 NOTE — Progress Notes (Deleted)
Subjective:     Patient ID: Jasmine [name], female   DOB: November 26, 1955    MRN: [id number]    Brief patient profile: 37    yowf   active smoker/MM  dx with asthma as child but by HS outgrew it until her 20's with flares but better between until early 2000s and maintain on multliple inhalers and then 02/2016 hosp in [place] then [facility] April 2019 and newly started on 02 = 2lpm as needed daytime and hs and referred to pulmonary clinic 08/24/2017 by Dr   Jasmine [name] with GOLD II spirometry documented 09/21/17     Admit date: 06/30/2017 Discharge date: 07/09/2017     Recommendations for Outpatient Follow-up:  pls refer to sleep medicine for osa testing/epworht/stopbang pls continue cessation counseling for Tob Needs cxr 1 mo Consider Megace/chantix? Recheck LFT and Magnesium in 1-2 weeks---has elevated LFT unknown sig [?CVC liver]   Discharge Diagnoses:  Principal Problem:   Acute respiratory failure with hypoxia (HCC) Active Problems:   Chronic back pain   HTN (hypertension)   Chronic narcotic use   Hypokalemia   COPD with acute exacerbation (HCC)   Malnutrition of moderate degree     Discharge Condition: please refert   Diet recommendation: eat anything to gain weight for now        [facility] Weights    06/30/17 2122 07/01/17 0500 07/02/17 0412  Weight: 47.2 kg (104 lb 0.9 oz) 47.2 kg (104 lb 0.9 oz) 48.1 kg (106 lb 0.7 oz)      History of present illness:  Please see note from Dr. Paulene [name] "65 y.o. female with a history of COPD, GERD, neuropathy, chronic pain on chronic narcotics, hypertension.  Patient presents with worsening shortness of breath over the past 3 to 4 weeks.  She presented to ED with acute respiratory distress with hypoxia and subsequently started on bipap"       Hospital Course:  Acute respiratory failure with hypoxia-patient says that she responded very well to nightly BiPAP.  She was able to sleep for the first time in a very long time. ?OSA-referrinf  to Sleep as OP.  Needs screening fro pulm htn as well? Could benefit from Cardio-pulm rehab if avail  [place]- continue current treatments with IV steroids, antibiotics and continuous around-the-clock neb treatments-narrowed and tapered on d/c home--5 day steroid taper Essential Hypertension-stable. Hypokalemia-replacement ordered.  recheck mag as op Chronic pain with opioid dependence-resume home pain med management regimen which has been stable per patient.          08/24/2017 1st [name] visit/ Jasmine [name]   Chief Complaint  Patient presents with   Pulmonary Consult    Referred by Dr. Adam [name].  Pt states dxed with COPD approx 10 years ago. She states she was hospitalized in April 2019 for COPD flare and sent home with o2.  She states she gets SOB with minimal exertion such as walking from room to room at home. She uses proair 3 x per wk on average and has duoneb but has not needed it since starting o2.    MMRC3 = can't walk 100 yards even at a slow pace at a flat grade s stopping due to sob  Even on 02  Rattling cough more prod first thing in am = min mucoid  maint on symbicort 160    Off prednisone x sev weeks and about the same since stopped just on symbicort 160 2bid  And  2lpm with activity  Sleeping  2lpm and 30 degrees    rec Plan A = Automatic =   Symbicort 160  Take 2 puffs first thing in am and then another 2 puffs about 12 hours later and spiriva 2 pffs each am  Work on inhaler technique:     Plan B = Backup Only use your albuterol (Proair)as a rescue medication Plan C = Crisis - only use your albuterol nebulizer if you first try Plan B      08/05/2019  f/u ov/Jasmine [name] re:  GOLD II/ symb /spiriva still smoking  - had [place] vaccine  Chief Complaint  Patient presents with   Follow-up    Breathing has been worse over the past 2 months- she is waking up in the morning feeling SOB and has to use her rescue inhaler. She is  using the rescue inhaler most days.   Dyspnea: foot slows her down> sob  Cough: minimal in am / white  Sleeping: on 02 flat bed  SABA use: not using symb first thing as rec and uses saba first  02: 2lpm hs only / wants portable system rec Plan A = Automatic = Always=    symbicort then spiriva first thing then symbicot 12 hours later  Work on inhaler technique:  Plan B = Backup (to supplement plan A, not to replace it) Only use your albuterol inhaler as a rescue medication Plan C = Crisis (instead of Plan B but only if Plan B stops working) - only use your albuterol nebulizer if you first try Plan B and it fails to help > ok to use the nebulizer up to every 4 hours but if start needing it regularly call for immediate appointment The key is to stop smoking completely before smoking completely stops you!       02/19/2020  f/u ov/Jasmine [name]/Jasmine [name] re: GOLD II/ 02 dep still smoking Chief Complaint  Patient presents with   Follow-up    Here for o2 recert- c/o waking up in the night coughing and "gagging" x 3 months. Cough is occ prod with clear sputum. She is using her albuterol inhaler about once per day.   Dyspnea: foot drop limiting / around the house doe  Cough: nightly cough/ gag 4 h after hs  and continues until gets up and uses saba in am / min clear mucus  Sleeping: bed is flat with wedge  SABA use: as above/ no neb  02: 2lpm hs  On ppi with breakfast  Rec Protonix 40 mg Take 30- 60 min before your first and last meals of the day  GERD diet reviewed, bed blocks rec  We will be scheduling you ONO on room air to qualify you for 02  Please remember to go to the  x-ray department  pos SPN CTchest 03/19/20 : 1. 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 After surgery and lately she reports a cough with clear mucus.  - MR   Dyspnea:  limited by R foot drop / using rollator  Cough: still coughing thick clear mucus esp in am / using delyms  Sleeping: bed is flat/ some am congestion  SABA use: too much  02: 4lpm 24/7  Rec For cough/ congestion > mucinex dm 1200 mg every 12 hours as needed and use flutter valve  The key is to stop smoking completely before smoking completely stops you! Plan A = Automatic = Always=    symbicort 160 / spiriva 2 of each then 12 hours later 2 puffs of symbicort  Plan B = Backup (to supplement plan A, not to replace it) Only use your albuterol inhaler as a rescue medication  Plan C = Crisis (instead of Plan B but only if Plan B stops working) - only use your albuterol nebulizer if you first try Plan B and it fails to help > ok to use the nebulizer up to every 4 hours but if start needing it regularly call for immediate appointment Ok to try albuterol 15 min before an activity (on alternating days)  that you know would usually make you short of breath . We will contact [facility] to do a best fit evaluation  - late add did not need amb 02 based on today's eval  Make sure you check your oxygen saturation  at your highest level of activity  t Please schedule a follow up visit in 3 months but call sooner if needed     02/02/2021  f/u ov/Jasmine [name]/Jasmine [name] re: GOLD 2 but 02 dep  maint on ***  No chief complaint on file.   Dyspnea:  *** Cough: *** Sleeping: *** SABA use: *** 02: *** Covid status: *** Lung cancer screening: ***   No obvious day to day or daytime variability or assoc excess/ purulent sputum or  mucus plugs or hemoptysis or cp or chest tightness, subjective wheeze or overt sinus or hb symptoms.   *** without nocturnal  or early am exacerbation  of respiratory  c/o's or need for noct saba. Also denies any obvious fluctuation of symptoms with weather or environmental changes or other aggravating or alleviating factors except as outlined above   No unusual exposure hx or h/o childhood pna/ asthma or knowledge of premature birth.  Current Allergies, Complete Past Medical History, Past Surgical History, Family History, and Social History were reviewed in [employer] record.  ROS  The following are not active complaints unless bolded Hoarseness, sore throat, dysphagia, dental problems, itching, sneezing,  nasal congestion or discharge of excess mucus or purulent secretions, ear ache,   fever, chills, sweats, unintended wt loss or wt gain, classically pleuritic or exertional cp,  orthopnea pnd or arm/hand swelling  or leg swelling, presyncope, palpitations, abdominal pain, anorexia, nausea, vomiting, diarrhea  or change in bowel habits or change in bladder habits, change in stools or change in urine, dysuria, hematuria,  rash, arthralgias, visual complaints, headache, numbness, weakness or ataxia or problems with walking or coordination,  change in mood or  memory.        No outpatient medications have been marked as taking for the 02/02/21 encounter (Appointment) with Tanda Rockers, MD.                      Objective:   Physical Exam  02/02/2021        ***  11/03/2020         92 02/19/2020       96  08/05/2019           98.6  01/09/2018        105  09/21/2017        104   08/24/17 105 lb 9.6 oz (47.9 kg)  07/31/17 106 lb (48.1 kg)  07/26/17 105 lb 3.2 oz (47.7 kg)    Vital signs reviewed  02/02/2021  - Note at rest 02 sats  ***% on ***   General appearance:    ***      Mod bar ***        I personally reviewed images and agree with radiology  impression as follows:  CXR:   pa and lat 01/04/21 New right lung base opacity which may represent atelectasis or pneumonia.       Assessment:

## 2021-02-11 ENCOUNTER — Inpatient Hospital Stay (HOSPITAL_COMMUNITY)
Admission: EM | Admit: 2021-02-11 | Discharge: 2021-02-16 | DRG: 439 | Disposition: A | Discharge status: Home / Self Care | Payer: Medicare Other | Attending: Internal Medicine | Admitting: Internal Medicine

## 2021-02-11 ENCOUNTER — Other Ambulatory Visit: Payer: Self-pay

## 2021-02-11 DIAGNOSIS — Z888 Allergy status to other drugs, medicaments and biological substances status: Secondary | ICD-10-CM

## 2021-02-11 DIAGNOSIS — K8681 Exocrine pancreatic insufficiency: Secondary | ICD-10-CM | POA: Diagnosis present

## 2021-02-11 DIAGNOSIS — E860 Dehydration: Secondary | ICD-10-CM | POA: Diagnosis present

## 2021-02-11 DIAGNOSIS — Z885 Allergy status to narcotic agent status: Secondary | ICD-10-CM

## 2021-02-11 DIAGNOSIS — G894 Chronic pain syndrome: Secondary | ICD-10-CM | POA: Diagnosis present

## 2021-02-11 DIAGNOSIS — F17210 Nicotine dependence, cigarettes, uncomplicated: Secondary | ICD-10-CM | POA: Diagnosis present

## 2021-02-11 DIAGNOSIS — K861 Other chronic pancreatitis: Secondary | ICD-10-CM | POA: Diagnosis not present

## 2021-02-11 DIAGNOSIS — Z981 Arthrodesis status: Secondary | ICD-10-CM

## 2021-02-11 DIAGNOSIS — F32A Depression, unspecified: Secondary | ICD-10-CM | POA: Diagnosis present

## 2021-02-11 DIAGNOSIS — Z720 Tobacco use: Secondary | ICD-10-CM | POA: Diagnosis present

## 2021-02-11 DIAGNOSIS — Z825 Family history of asthma and other chronic lower respiratory diseases: Secondary | ICD-10-CM

## 2021-02-11 DIAGNOSIS — E876 Hypokalemia: Secondary | ICD-10-CM | POA: Diagnosis present

## 2021-02-11 DIAGNOSIS — I700 Atherosclerosis of aorta: Secondary | ICD-10-CM | POA: Diagnosis present

## 2021-02-11 DIAGNOSIS — J449 Chronic obstructive pulmonary disease, unspecified: Secondary | ICD-10-CM | POA: Diagnosis present

## 2021-02-11 DIAGNOSIS — Z87442 Personal history of urinary calculi: Secondary | ICD-10-CM

## 2021-02-11 DIAGNOSIS — Z20822 Contact with and (suspected) exposure to covid-19: Secondary | ICD-10-CM | POA: Diagnosis present

## 2021-02-11 DIAGNOSIS — R52 Pain, unspecified: Secondary | ICD-10-CM | POA: Diagnosis present

## 2021-02-11 DIAGNOSIS — J432 Centrilobular emphysema: Secondary | ICD-10-CM | POA: Diagnosis present

## 2021-02-11 DIAGNOSIS — Z8249 Family history of ischemic heart disease and other diseases of the circulatory system: Secondary | ICD-10-CM

## 2021-02-11 DIAGNOSIS — R079 Chest pain, unspecified: Secondary | ICD-10-CM

## 2021-02-11 DIAGNOSIS — Z79899 Other long term (current) drug therapy: Secondary | ICD-10-CM

## 2021-02-11 DIAGNOSIS — Z85118 Personal history of other malignant neoplasm of bronchus and lung: Secondary | ICD-10-CM

## 2021-02-11 DIAGNOSIS — Z8719 Personal history of other diseases of the digestive system: Secondary | ICD-10-CM

## 2021-02-11 DIAGNOSIS — Z881 Allergy status to other antibiotic agents status: Secondary | ICD-10-CM

## 2021-02-11 DIAGNOSIS — J9611 Chronic respiratory failure with hypoxia: Secondary | ICD-10-CM | POA: Diagnosis present

## 2021-02-11 DIAGNOSIS — F419 Anxiety disorder, unspecified: Secondary | ICD-10-CM | POA: Diagnosis present

## 2021-02-11 DIAGNOSIS — Z9981 Dependence on supplemental oxygen: Secondary | ICD-10-CM

## 2021-02-11 DIAGNOSIS — I10 Essential (primary) hypertension: Secondary | ICD-10-CM | POA: Diagnosis present

## 2021-02-11 DIAGNOSIS — J302 Other seasonal allergic rhinitis: Secondary | ICD-10-CM | POA: Diagnosis present

## 2021-02-11 DIAGNOSIS — K219 Gastro-esophageal reflux disease without esophagitis: Secondary | ICD-10-CM | POA: Diagnosis present

## 2021-02-11 DIAGNOSIS — R112 Nausea with vomiting, unspecified: Secondary | ICD-10-CM | POA: Diagnosis present

## 2021-02-11 DIAGNOSIS — Z7951 Long term (current) use of inhaled steroids: Secondary | ICD-10-CM

## 2021-02-11 DIAGNOSIS — Z90710 Acquired absence of both cervix and uterus: Secondary | ICD-10-CM

## 2021-02-11 NOTE — ED Notes (Signed)
Wears 3.5L at baseline

## 2021-02-11 NOTE — ED Triage Notes (Signed)
Presents from home for nausea since Tues not relieved by home oral zofran, poor intake, pain in chest and between shoulder blades that she believes is r/t emesis. H/o R upper lobectomy in May for lung CA, not receiving chemo or radiation.Received 300cc and 4mg  zofran with EMS. EMS vitals: SBP170, St120s on monitor

## 2021-02-12 ENCOUNTER — Emergency Department (HOSPITAL_COMMUNITY): Payer: Medicare Other

## 2021-02-12 ENCOUNTER — Encounter (HOSPITAL_COMMUNITY): Payer: Self-pay | Admitting: Family Medicine

## 2021-02-12 ENCOUNTER — Observation Stay (HOSPITAL_COMMUNITY): Payer: Medicare Other

## 2021-02-12 DIAGNOSIS — R52 Pain, unspecified: Secondary | ICD-10-CM | POA: Diagnosis present

## 2021-02-12 DIAGNOSIS — E876 Hypokalemia: Secondary | ICD-10-CM | POA: Diagnosis present

## 2021-02-12 DIAGNOSIS — I700 Atherosclerosis of aorta: Secondary | ICD-10-CM | POA: Diagnosis present

## 2021-02-12 DIAGNOSIS — K861 Other chronic pancreatitis: Secondary | ICD-10-CM | POA: Diagnosis present

## 2021-02-12 DIAGNOSIS — I10 Essential (primary) hypertension: Secondary | ICD-10-CM | POA: Diagnosis present

## 2021-02-12 DIAGNOSIS — G894 Chronic pain syndrome: Secondary | ICD-10-CM | POA: Diagnosis present

## 2021-02-12 DIAGNOSIS — Z825 Family history of asthma and other chronic lower respiratory diseases: Secondary | ICD-10-CM | POA: Diagnosis not present

## 2021-02-12 DIAGNOSIS — J449 Chronic obstructive pulmonary disease, unspecified: Secondary | ICD-10-CM | POA: Diagnosis not present

## 2021-02-12 DIAGNOSIS — F32A Depression, unspecified: Secondary | ICD-10-CM | POA: Diagnosis present

## 2021-02-12 DIAGNOSIS — F17210 Nicotine dependence, cigarettes, uncomplicated: Secondary | ICD-10-CM | POA: Diagnosis present

## 2021-02-12 DIAGNOSIS — Z85118 Personal history of other malignant neoplasm of bronchus and lung: Secondary | ICD-10-CM | POA: Diagnosis not present

## 2021-02-12 DIAGNOSIS — R1013 Epigastric pain: Secondary | ICD-10-CM

## 2021-02-12 DIAGNOSIS — Z9981 Dependence on supplemental oxygen: Secondary | ICD-10-CM | POA: Diagnosis not present

## 2021-02-12 DIAGNOSIS — E860 Dehydration: Secondary | ICD-10-CM | POA: Diagnosis present

## 2021-02-12 DIAGNOSIS — J302 Other seasonal allergic rhinitis: Secondary | ICD-10-CM | POA: Diagnosis present

## 2021-02-12 DIAGNOSIS — Z981 Arthrodesis status: Secondary | ICD-10-CM | POA: Diagnosis not present

## 2021-02-12 DIAGNOSIS — J9611 Chronic respiratory failure with hypoxia: Secondary | ICD-10-CM | POA: Diagnosis present

## 2021-02-12 DIAGNOSIS — F419 Anxiety disorder, unspecified: Secondary | ICD-10-CM | POA: Diagnosis present

## 2021-02-12 DIAGNOSIS — R112 Nausea with vomiting, unspecified: Secondary | ICD-10-CM | POA: Diagnosis present

## 2021-02-12 DIAGNOSIS — Z20822 Contact with and (suspected) exposure to covid-19: Secondary | ICD-10-CM | POA: Diagnosis present

## 2021-02-12 DIAGNOSIS — R079 Chest pain, unspecified: Secondary | ICD-10-CM | POA: Diagnosis not present

## 2021-02-12 DIAGNOSIS — Z8719 Personal history of other diseases of the digestive system: Secondary | ICD-10-CM | POA: Diagnosis not present

## 2021-02-12 DIAGNOSIS — Z87442 Personal history of urinary calculi: Secondary | ICD-10-CM | POA: Diagnosis not present

## 2021-02-12 DIAGNOSIS — Z8249 Family history of ischemic heart disease and other diseases of the circulatory system: Secondary | ICD-10-CM | POA: Diagnosis not present

## 2021-02-12 DIAGNOSIS — J432 Centrilobular emphysema: Secondary | ICD-10-CM | POA: Diagnosis present

## 2021-02-12 DIAGNOSIS — K8681 Exocrine pancreatic insufficiency: Secondary | ICD-10-CM | POA: Diagnosis present

## 2021-02-12 DIAGNOSIS — K219 Gastro-esophageal reflux disease without esophagitis: Secondary | ICD-10-CM | POA: Diagnosis present

## 2021-02-12 DIAGNOSIS — Z90710 Acquired absence of both cervix and uterus: Secondary | ICD-10-CM | POA: Diagnosis not present

## 2021-02-12 LAB — CBC WITH DIFFERENTIAL/PLATELET
Abs Immature Granulocytes: 0.03 10*3/uL (ref 0.00–0.07)
Basophils Absolute: 0.1 10*3/uL (ref 0.0–0.1)
Basophils Relative: 1 %
Eosinophils Absolute: 0 10*3/uL (ref 0.0–0.5)
Eosinophils Relative: 0 %
HCT: 41.9 % (ref 36.0–46.0)
Hemoglobin: 12.7 g/dL (ref 12.0–15.0)
Immature Granulocytes: 0 %
Lymphocytes Relative: 20 %
Lymphs Abs: 1.5 10*3/uL (ref 0.7–4.0)
MCH: 31.4 pg (ref 26.0–34.0)
MCHC: 30.3 g/dL (ref 30.0–36.0)
MCV: 103.5 fL — ABNORMAL HIGH (ref 80.0–100.0)
Monocytes Absolute: 0.9 10*3/uL (ref 0.1–1.0)
Monocytes Relative: 12 %
Neutro Abs: 5 10*3/uL (ref 1.7–7.7)
Neutrophils Relative %: 67 %
Platelets: 362 10*3/uL (ref 150–400)
RBC: 4.05 MIL/uL (ref 3.87–5.11)
RDW: 14.3 % (ref 11.5–15.5)
WBC: 7.4 10*3/uL (ref 4.0–10.5)
nRBC: 0 % (ref 0.0–0.2)

## 2021-02-12 LAB — TROPONIN I (HIGH SENSITIVITY)
Troponin I (High Sensitivity): 19 ng/L — ABNORMAL HIGH (ref <18)
Troponin I (High Sensitivity): 21 ng/L — ABNORMAL HIGH (ref <18)

## 2021-02-12 LAB — COMPREHENSIVE METABOLIC PANEL
ALT: 18 U/L (ref 0–44)
AST: 24 U/L (ref 15–41)
Albumin: 3.7 g/dL (ref 3.5–5.0)
Alkaline Phosphatase: 85 U/L (ref 38–126)
Anion gap: 15 (ref 5–15)
BUN: 11 mg/dL (ref 8–23)
CO2: 25 mmol/L (ref 22–32)
Calcium: 9.4 mg/dL (ref 8.9–10.3)
Chloride: 94 mmol/L — ABNORMAL LOW (ref 98–111)
Creatinine, Ser: 0.57 mg/dL (ref 0.44–1.00)
GFR, Estimated: >60 mL/min (ref >60)
Glucose, Bld: 98 mg/dL (ref 70–99)
Potassium: 3 mmol/L — ABNORMAL LOW (ref 3.5–5.1)
Sodium: 134 mmol/L — ABNORMAL LOW (ref 135–145)
Total Bilirubin: 1.2 mg/dL (ref 0.3–1.2)
Total Protein: 7.4 g/dL (ref 6.5–8.1)

## 2021-02-12 LAB — LACTIC ACID, PLASMA
Lactic Acid, Venous: 0.9 mmol/L (ref 0.5–1.9)
Lactic Acid, Venous: 1 mmol/L (ref 0.5–1.9)

## 2021-02-12 LAB — URINALYSIS, ROUTINE W REFLEX MICROSCOPIC
Glucose, UA: NEGATIVE mg/dL
Hgb urine dipstick: NEGATIVE
Ketones, ur: 15 mg/dL — AB
Leukocytes,Ua: NEGATIVE
Nitrite: NEGATIVE
Specific Gravity, Urine: <1.005 — ABNORMAL LOW (ref 1.005–1.030)
pH: 7 (ref 5.0–8.0)

## 2021-02-12 LAB — RESP PANEL BY RT-PCR (FLU A&B, COVID) ARPGX2
Influenza A by PCR: NEGATIVE
Influenza B by PCR: NEGATIVE
SARS Coronavirus 2 by RT PCR: NEGATIVE

## 2021-02-12 LAB — URINALYSIS, MICROSCOPIC (REFLEX)

## 2021-02-12 LAB — LIPASE, BLOOD: Lipase: 32 U/L (ref 11–51)

## 2021-02-12 MED ORDER — HYDROMORPHONE HCL 1 MG/ML IJ SOLN
1.0000 mg | Freq: Once | INTRAMUSCULAR | Status: AC
  Administered 2021-02-12: 1 mg via INTRAVENOUS
  Filled 2021-02-12: qty 1

## 2021-02-12 MED ORDER — DIPHENHYDRAMINE HCL 50 MG/ML IJ SOLN
25.0000 mg | Freq: Once | INTRAMUSCULAR | Status: AC
  Administered 2021-02-12: 25 mg via INTRAVENOUS
  Filled 2021-02-12: qty 1

## 2021-02-12 MED ORDER — PANTOPRAZOLE SODIUM 40 MG IV SOLR
40.0000 mg | Freq: Two times a day (BID) | INTRAVENOUS | Status: DC
  Administered 2021-02-12 – 2021-02-16 (×9): 40 mg via INTRAVENOUS
  Filled 2021-02-12 (×9): qty 40

## 2021-02-12 MED ORDER — NICOTINE 14 MG/24HR TD PT24
14.0000 mg | MEDICATED_PATCH | Freq: Every day | TRANSDERMAL | Status: DC
  Administered 2021-02-12 – 2021-02-16 (×5): 14 mg via TRANSDERMAL
  Filled 2021-02-12 (×5): qty 1

## 2021-02-12 MED ORDER — METOCLOPRAMIDE HCL 5 MG/ML IJ SOLN
10.0000 mg | Freq: Once | INTRAMUSCULAR | Status: AC
  Administered 2021-02-12: 10 mg via INTRAVENOUS
  Filled 2021-02-12: qty 2

## 2021-02-12 MED ORDER — KCL IN DEXTROSE-NACL 40-5-0.9 MEQ/L-%-% IV SOLN
INTRAVENOUS | Status: DC
  Filled 2021-02-12 (×5): qty 1000

## 2021-02-12 MED ORDER — METOCLOPRAMIDE HCL 5 MG/ML IJ SOLN
5.0000 mg | Freq: Three times a day (TID) | INTRAMUSCULAR | Status: DC
  Administered 2021-02-12 – 2021-02-13 (×4): 5 mg via INTRAVENOUS
  Filled 2021-02-12 (×4): qty 2

## 2021-02-12 MED ORDER — TIZANIDINE HCL 4 MG PO TABS
6.0000 mg | ORAL_TABLET | Freq: Every evening | ORAL | Status: DC | PRN
  Administered 2021-02-12 – 2021-02-15 (×4): 6 mg via ORAL
  Filled 2021-02-12 (×4): qty 1

## 2021-02-12 MED ORDER — HEPARIN SODIUM (PORCINE) 5000 UNIT/ML IJ SOLN
5000.0000 [IU] | Freq: Three times a day (TID) | INTRAMUSCULAR | Status: DC
  Administered 2021-02-12 – 2021-02-16 (×14): 5000 [IU] via SUBCUTANEOUS
  Filled 2021-02-12 (×14): qty 1

## 2021-02-12 MED ORDER — ATORVASTATIN CALCIUM 40 MG PO TABS: 40.0000 mg | ORAL_TABLET | Freq: Every day | ORAL | Status: DC

## 2021-02-12 MED ORDER — TRAZODONE HCL 50 MG PO TABS
150.0000 mg | ORAL_TABLET | Freq: Every day | ORAL | Status: DC
  Administered 2021-02-12 – 2021-02-15 (×4): 150 mg via ORAL
  Filled 2021-02-12 (×4): qty 3

## 2021-02-12 MED ORDER — MIRABEGRON ER 25 MG PO TB24
25.0000 mg | ORAL_TABLET | Freq: Every day | ORAL | Status: DC
  Administered 2021-02-12 – 2021-02-16 (×5): 25 mg via ORAL
  Filled 2021-02-12 (×5): qty 1

## 2021-02-12 MED ORDER — POTASSIUM CHLORIDE 10 MEQ/100ML IV SOLN
10.0000 meq | INTRAVENOUS | Status: DC
  Administered 2021-02-12: 10 meq via INTRAVENOUS
  Filled 2021-02-12: qty 100

## 2021-02-12 MED ORDER — IOHEXOL 350 MG/ML SOLN
100.0000 mL | Freq: Once | INTRAVENOUS | Status: AC | PRN
  Administered 2021-02-12: 100 mL via INTRAVENOUS

## 2021-02-12 MED ORDER — ALUM & MAG HYDROXIDE-SIMETH 200-200-20 MG/5ML PO SUSP
30.0000 mL | Freq: Once | ORAL | Status: AC
  Administered 2021-02-12: 30 mL via ORAL
  Filled 2021-02-12: qty 30

## 2021-02-12 MED ORDER — PROMETHAZINE HCL 12.5 MG PO TABS
12.5000 mg | ORAL_TABLET | Freq: Four times a day (QID) | ORAL | Status: DC | PRN
  Administered 2021-02-12 – 2021-02-14 (×5): 12.5 mg via ORAL
  Filled 2021-02-12 (×5): qty 1

## 2021-02-12 MED ORDER — MOMETASONE FURO-FORMOTEROL FUM 200-5 MCG/ACT IN AERO
2.0000 | INHALATION_SPRAY | Freq: Two times a day (BID) | RESPIRATORY_TRACT | Status: DC
  Administered 2021-02-12 – 2021-02-16 (×9): 2 via RESPIRATORY_TRACT
  Filled 2021-02-12: qty 8.8

## 2021-02-12 MED ORDER — LABETALOL HCL 5 MG/ML IV SOLN
10.0000 mg | INTRAVENOUS | Status: DC | PRN
  Administered 2021-02-13: 10 mg via INTRAVENOUS
  Filled 2021-02-12: qty 4

## 2021-02-12 MED ORDER — SODIUM CHLORIDE 0.9 % IV BOLUS
1000.0000 mL | Freq: Once | INTRAVENOUS | Status: AC
  Administered 2021-02-12: 1000 mL via INTRAVENOUS

## 2021-02-12 MED ORDER — METOPROLOL TARTRATE 5 MG/5ML IV SOLN
5.0000 mg | Freq: Once | INTRAVENOUS | Status: AC
  Administered 2021-02-12: 5 mg via INTRAVENOUS
  Filled 2021-02-12: qty 5

## 2021-02-12 MED ORDER — ACETAMINOPHEN 650 MG RE SUPP: 650.0000 mg | Freq: Four times a day (QID) | RECTAL | Status: DC | PRN

## 2021-02-12 MED ORDER — DULOXETINE HCL 60 MG PO CPEP
90.0000 mg | ORAL_CAPSULE | Freq: Every day | ORAL | Status: DC
  Administered 2021-02-12 – 2021-02-16 (×5): 90 mg via ORAL
  Filled 2021-02-12: qty 1
  Filled 2021-02-12: qty 3
  Filled 2021-02-12 (×3): qty 1

## 2021-02-12 MED ORDER — POTASSIUM CHLORIDE 2 MEQ/ML IV SOLN: INTRAVENOUS | Status: DC

## 2021-02-12 MED ORDER — HYDROCODONE-ACETAMINOPHEN 5-325 MG PO TABS
1.0000 | ORAL_TABLET | ORAL | Status: DC | PRN
  Administered 2021-02-12 – 2021-02-13 (×3): 2 via ORAL
  Filled 2021-02-12 (×3): qty 2

## 2021-02-12 MED ORDER — MORPHINE SULFATE (PF) 2 MG/ML IV SOLN
2.0000 mg | INTRAVENOUS | Status: DC | PRN
  Administered 2021-02-12 – 2021-02-13 (×12): 2 mg via INTRAVENOUS
  Filled 2021-02-12 (×12): qty 1

## 2021-02-12 MED ORDER — ACETAMINOPHEN 325 MG PO TABS: 650.0000 mg | ORAL_TABLET | Freq: Four times a day (QID) | ORAL | Status: DC | PRN

## 2021-02-12 MED ORDER — LIDOCAINE VISCOUS HCL 2 % MT SOLN
15.0000 mL | Freq: Once | OROMUCOSAL | Status: AC
  Administered 2021-02-12: 15 mL via ORAL
  Filled 2021-02-12: qty 15

## 2021-02-12 MED ORDER — AMLODIPINE BESYLATE 5 MG PO TABS: 10.0000 mg | ORAL_TABLET | Freq: Every day | ORAL | Status: DC

## 2021-02-12 NOTE — ED Notes (Signed)
Pt not tolerating IV potassium, shouting that it is burning. Attempted to slow rate down to improved discomfort. Pt still unable to tolerate. Medications stopped and provider notifed

## 2021-02-12 NOTE — H&P (Signed)
TRH H&P    Patient Demographics:    Jasmine [name], is a 65 y.o. female  MRN: [id number]  DOB - Sep 02, 1955  Admit Date - 02/11/2021  Referring MD/NP/PA: Betsey Holiday  Outpatient Primary MD for the patient is Janora Norlander, DO  Patient coming from: Home  Chief complaint- Intractable pain and vomiting   HPI:    Jasmine [name]  is a 65 y.o. female, with history of anemia, anxiety, chronic back pain, tobacco use disorder, COPD, history of lung cancer status post lobectomy, GERD, pancreatitis, seizures, and more presents ED with a chief complaint of intractable pain and vomiting.  Patient reports that she has had constant vomiting, chest pain, and shoulder blade pain.  The vomiting started first and has been ongoing for 3 days.  She reports she cannot tolerate any p.o. intake-neither solids nor liquids.  Her last normal meal was 4 days ago.  Her last normal bowel movement was also 4 days ago.  She did have a tiny bowel movement 3 days ago, but that was not normal for her.  Patient denies any abdominal pain, but when he asked her to point to her chest pain she points to her epigastric region and lower chest.  She reports that the pain radiates towards her right breast and right upper quadrant.  Her chest pain is worse right after vomiting.  She reports it feels like a punching pain.  It is intermittent, but has been more frequent over the days.  She takes opiates at home for spine, neck, hip pain.  She has been taking these opiates and the pain is not been relieved in her chest.  She thinks that shoulder blade pain is the same as the chest pain, they are not separate.  He does not feel like the pain is radiating through to her back either.  Patient is not sure if she has had her gallbladder out or not.  It looks like she had a CT scan done earlier this year that commented on a dilated common bile duct with a possible stone.   Patient reports that she has had generalized weakness has been progressive over the last 3 days.  She has no history of CHF.  Patient is on 3 L nasal cannula at home secondary to COPD and lobectomy.  Patient currently still smokes 5 cigarettes a day.  She does not drink alcohol, has not use marijuana, and is vaccinated for COVID.  Patient is full code.  She reports she has a living will that makes her PCP her decision maker.  In the ED Temp 98.6, heart rate 98-1 22, respiratory rate 10-27, blood pressure as high as 180/100 No leukocytosis at 7.4, hemoglobin 12.7, platelets 362 Chemistry panel reveals a hypokalemia 3.0 CT PE shows no PE, but retained secretions in the right main bronchus. EKG shows sinus tachycardia with a rate of 118, QTc 452, no acute ischemic changes Patient was given Benadryl, Dilaudid x2, Reglan, 1 L normal saline bolus without improvement in her pain or symptoms Admission requested for intractable nausea and  vomiting as well as pain    Review of systems:    In addition to the HPI above,  No Fever-chills, No Headache, No changes with Vision or hearing, No problems swallowing food or Liquids,  Cough or Shortness of Breath, No Blood in stool or Urine, No dysuria, No new skin rashes or bruises, No new joints pains-aches,  No new weakness, tingling, numbness in any extremity, No recent weight gain or loss, No polyuria, polydypsia or polyphagia, No significant Mental Stressors.  All other systems reviewed and are negative.    Past History of the following :    Past Medical History:  Diagnosis Date   Allergy    Anemia    Anxiety    Arthritis    Asthma    Carpal tunnel syndrome    Chronic back pain    Cigarette smoker 9/48/5462   Complication of anesthesia    in the past has had N/V, the last few surgeries she has not been   COPD (chronic obstructive pulmonary disease) ([place])    Easy bruising 07/10/2016   GERD (gastroesophageal reflux disease)    Headache     Heart murmur 2005   never had any problems   History of blood transfusion 2018   History of kidney stones    Hypertension    Hypoglycemia    Hypoglycemia    Iron deficiency anemia 07/11/2016   Neuropathy    both arms   Normocytic anemia 07/29/2015   Pancreatitis    Pneumonia    PONV (postoperative nausea and vomiting)    Postoperative anemia due to acute blood loss 11/15/2015   R lung cancer    Sciatica    Seizures ([place])    as a child when she would have an asthma attack. none as an adult   Shortness of breath dyspnea       Past Surgical History:  Procedure Laterality Date   ABDOMINAL HYSTERECTOMY  7035   APPLICATION OF ROBOTIC ASSISTANCE FOR SPINAL PROCEDURE N/A 09/05/2016   Procedure: APPLICATION OF ROBOTIC ASSISTANCE FOR SPINAL PROCEDURE;  Surgeon: Consuella Lose, MD;  Location: [facility];  Service: Neurosurgery;  Laterality: N/A;   BACK SURGERY     BIOPSY  06/14/2020   Procedure: BIOPSY;  Surgeon: Daneil Dolin, MD;  [facility];  Service: Endoscopy;;   [place]   COLONOSCOPY     unsure last   COLONOSCOPY WITH PROPOFOL N/A 06/14/2020   Procedure: COLONOSCOPY WITH PROPOFOL;  Surgeon: Daneil Dolin, MD;  [facility];  Service: Endoscopy;  Laterality: N/A;  PM   cyst removal face     disk repair     neck and lumbar region   ESOPHAGOGASTRODUODENOSCOPY (EGD) WITH PROPOFOL N/A 06/14/2020   Procedure: ESOPHAGOGASTRODUODENOSCOPY (EGD) WITH PROPOFOL;  Surgeon: Daneil Dolin, MD;  [facility];  Service: Endoscopy;  Laterality: N/A;   FOOT SURGERY Bilateral    to file bones down    HARDWARE REMOVAL N/A 09/05/2016   Procedure: HARDWARE REMOVAL AND REPLACEMENT OF LUMBAR FIVE SCREWS;  Surgeon: Consuella Lose, MD;  Location: [facility];  Service: Neurosurgery;  Laterality: N/A;   IMPLANTATION / PLACEMENT EPIDURAL NEUROSTIMULATOR ELECTRODES     INTERCOSTAL NERVE BLOCK Right 07/29/2020   Procedure: INTERCOSTAL  NERVE BLOCK;  Surgeon: Melrose Nakayama, MD;  Location[facility];  Service: Thoracic;  Laterality: Right;   LAMINECTOMY     2006   lobectomy Right  07/2020   LUMBAR FUSION     LYMPH NODE BIOPSY Right 07/29/2020   Procedure: LYMPH NODE BIOPSY;  Surgeon: Melrose Nakayama, MD;  Location: [facility];  Service: Thoracic;  Laterality: Right;   POLYPECTOMY  06/14/2020   Procedure: POLYPECTOMY INTESTINAL;  Surgeon: Daneil Dolin, MD;  [facility];  Service: Endoscopy;;   SPINAL CORD STIMULATOR BATTERY EXCHANGE     SPINAL CORD STIMULATOR INSERTION N/A 07/05/2016   Procedure: REPLACEMENT OF LUMBAR SPINAL CORD STIMULATOR BATTERY;  Surgeon: Newman Pies, MD;  Location: [facility];  Service: Neurosurgery;  Laterality: N/A;   TONSILLECTOMY        Social History:      Social History   Tobacco Use   Smoking status: Every Day    Packs/day: 0.25    Years: 35.00    Pack years: 8.75    Types: Cigarettes   Smokeless tobacco: Never   Tobacco comments:    trying to quit  Substance Use Topics   Alcohol use: Yes    Alcohol/week: 2.0 standard drinks    Types: 2 Glasses of wine per week    Comment: occasionally       Family History :     Family History  Problem Relation Age of Onset   Heart disease Mother    Diabetes Mother    Hypertension Mother    Hyperlipidemia Mother    COPD Mother        smoked   Cancer Mother        bile duct   COPD Father    Diabetes Father    Heart disease Father    Cancer Father        throat   Dementia Father    Asthma Son    Hypertension Sister    Hyperlipidemia Sister    Hypertension Brother    Hyperlipidemia Brother    Hypertension Brother    Hyperlipidemia Brother    Emphysema Maternal Grandmother        smoked   Colon cancer Neg Hx    Colon polyps Neg Hx       Home Medications:   Prior to Admission medications   Medication Sig Start Date End Date Taking? Authorizing Provider  albuterol (VENTOLIN HFA) 108 (90 Base) MCG/ACT  inhaler Inhale into the lungs every 6 (six) hours as needed for wheezing or shortness of breath. Patient not taking: Reported on 01/13/2021    [provider]  amLODipine (NORVASC) 10 MG tablet Take 1 tablet (10 mg total) by mouth daily. 07/09/20   Janora Norlander, DO  atorvastatin (LIPITOR) 40 MG tablet Take 1 tablet (40 mg total) by mouth daily. 07/09/20   Ronnie Doss M, DO  budesonide-formoterol (SYMBICORT) 160-4.5 MCG/ACT inhaler USE 2 INHALATIONS TWICE A DAY 01/17/21   Ronnie Doss M, DO  Cholecalciferol 125 MCG (5000 UT) capsule Take 5,000 Units by mouth daily.    [provider]  dextromethorphan-guaiFENesin (MUCINEX DM) 30-600 MG 12hr tablet Take 1 tablet by mouth 2 (two) times daily.    [provider]  diclofenac sodium (VOLTAREN) 1 % GEL Apply 1 application topically daily as needed for muscle pain. Patient not taking: Reported on 01/13/2021 06/30/16   [provider]  DULoxetine (CYMBALTA) 30 MG capsule Take 3 capsules (90 mg total) by mouth daily. 07/09/20   Janora Norlander, DO  ipratropium-albuterol (DUONEB) 0.5-2.5 (3) MG/3ML SOLN Inhale 3 mLs into the lungs 4 (four) times daily as needed (for shortness of  breath/wheezing). *May use as needed Patient not taking: Reported on 01/13/2021 02/21/16   [provider]  Iron Polysacch Cmplx-B12-FA (POLY-IRON 150 FORTE) 150-0.025-1 MG CAPS Take 1 capsule by mouth 2 (two) times daily. 05/27/20   Derek Jack, MD  L-Methylfolate-Algae-B12-B6 [facility]) 3-90.314-2-35 MG CAPS TAKE 1 TABLET BY MOUTH TWO TIMES A DAY Patient taking differently: Take 1 tablet by mouth in the morning and at bedtime. 09/10/19   Janora Norlander, DO  magnesium oxide (MAG-OX) 400 (241.3 Mg) MG tablet Take 400 mg by mouth 2 (two) times daily. 02/07/20   [provider]  mirabegron ER (MYRBETRIQ) 25 MG TB24 tablet Take 1 tablet (25 mg total) by mouth daily. 01/10/21   Janora Norlander, DO  nicotine  (NICODERM CQ) 14 mg/24hr patch Place 1 patch (14 mg total) onto the skin daily. 06/23/20   Janora Norlander, DO  Nutritional Supplements (ENSURE HIGH PROTEIN) LIQD Drink 1 can 3 times daily. 08/30/20   Janora Norlander, DO  omeprazole (PRILOSEC) 40 MG capsule Take one capsule once to twice daily before a meal. 01/18/21   Mahala Menghini, PA-C  ondansetron (ZOFRAN ODT) 4 MG disintegrating tablet Take 1 tablet (4 mg total) by mouth every 8 (eight) hours as needed for nausea or vomiting. Patient not taking: Reported on 01/13/2021 09/12/20   Maudie Flakes, MD  oxyCODONE-acetaminophen (PERCOCET) 10-325 MG tablet Take 1 tablet by mouth 5 (five) times daily. 08/27/20   [provider]  OXYCONTIN 15 MG 12 hr tablet Take 15 mg by mouth every 12 (twelve) hours. 08/28/20   [provider]  OXYGEN Inhale 3 L into the lungs See admin instructions. continuous    [provider]  SPIRIVA RESPIMAT 2.5 MCG/ACT AERS USE 2 INHALATIONS DAILY Patient taking differently: Inhale 2 puffs into the lungs daily. 07/13/20   Tanda Rockers, MD  tizanidine (ZANAFLEX) 6 MG capsule Take 6 mg by mouth 3 (three) times daily. 08/27/20   [provider]  traZODone (DESYREL) 150 MG tablet TAKE 1 TO 2 TABLETS AT BEDTIME Patient taking differently: Take 150-300 mg by mouth at bedtime. 02/02/20   Janora Norlander, DO     Allergies:     Allergies  Allergen Reactions   Lyrica [Pregabalin] Other (See Comments)    EXTREME SHAKING/TREMBLING   Darvon [Propoxyphene]     UNSPECIFIED REACTION    Gabapentin     Extreme shaking and trembling    Augmentin [Amoxicillin-Pot Clavulanate] Nausea And Vomiting    Has patient had a PCN reaction causing immediate rash, facial/tongue/throat swelling, SOB or lightheadedness with hypotension:no Has patient had a PCN reaction causing severe rash involving mucus membranes or skin necrosis:No Has patient had a PCN reaction that required hospitalization:No Has  patient had a PCN reaction occurring within the last 10 years:Yes--ONLY N/V If all of the above answers are "NO", then may proceed with Cephalosporin use.    Erythromycin Itching and Rash        Vibramycin [Doxycycline Calcium] Itching and Rash    "[place] "     Physical Exam:   Vitals  Blood pressure (!) 172/106, pulse (!) 109, temperature 99 F (37.2 C), temperature source Oral, resp. rate 15, SpO2 100 %.   1.  General: Patient lying supine in bed,  no acute distress   2. Psychiatric: Alert and oriented x 3, mood and behavior normal for situation, pleasant and cooperative with exam   3. Neurologic: Speech and language are normal, face is  symmetric, moves all 4 extremities voluntarily, at baseline without acute deficits on limited exam   4. HEENMT:  Head is atraumatic, normocephalic, pupils reactive to light, neck is supple, trachea is midline, mucous membranes are moist   5. Respiratory : Lungs are clear to auscultation bilaterally without wheezing, rhonchi, rales, no cyanosis, no increase in work of breathing or accessory muscle use   6. Cardiovascular : Heart rate normal, rhythm is regular, systolic murmur present, rubs or gallops, no peripheral edema, peripheral pulses palpated   7. Gastrointestinal:  Abdomen is soft, nondistended, tenderness to palpation in the epigastric region, bowel sounds active, no masses or organomegaly palpated   8. Skin:  Skin is warm, dry and intact without rashes, acute lesions, or ulcers on limited exam   9.Musculoskeletal:  No acute deformities or trauma, no asymmetry in tone, no peripheral edema, peripheral pulses palpated, no tenderness to palpation in the extremities     Data Review:    CBC Recent Labs  Lab 02/12/21 0108  WBC 7.4  HGB 12.7  HCT 41.9  PLT 362  MCV 103.5*  MCH 31.4  MCHC 30.3  RDW 14.3  LYMPHSABS 1.5  MONOABS 0.9  EOSABS 0.0  BASOSABS 0.1    ------------------------------------------------------------------------------------------------------------------  Results for orders placed or performed during the hospital encounter of 02/11/21 (from the past 48 hour(s))  CBC with Differential/Platelet     Status: Abnormal   Collection Time: 02/12/21  1:08 AM  Result Value Ref Range   WBC 7.4 4.0 - 10.5 K/uL   RBC 4.05 3.87 - 5.11 MIL/uL   Hemoglobin 12.7 12.0 - 15.0 g/dL   HCT 41.9 36.0 - 46.0 %   MCV 103.5 (H) 80.0 - 100.0 fL   MCH 31.4 26.0 - 34.0 pg   MCHC 30.3 30.0 - 36.0 g/dL   RDW 14.3 11.5 - 15.5 %   Platelets 362 150 - 400 K/uL   nRBC 0.0 0.0 - 0.2 %   Neutrophils Relative % 67 %   Neutro Abs 5.0 1.7 - 7.7 K/uL   Lymphocytes Relative 20 %   Lymphs Abs 1.5 0.7 - 4.0 K/uL   Monocytes Relative 12 %   Monocytes Absolute 0.9 0.1 - 1.0 K/uL   Eosinophils Relative 0 %   Eosinophils Absolute 0.0 0.0 - 0.5 K/uL   Basophils Relative 1 %   Basophils Absolute 0.1 0.0 - 0.1 K/uL   Immature Granulocytes 0 %   Abs Immature Granulocytes 0.03 0.00 - 0.07 K/uL    Comment: Performed at [facility], [address] [place], [place] [address]  Comprehensive metabolic panel     Status: Abnormal   Collection Time: 02/12/21  1:08 AM  Result Value Ref Range   Sodium 134 (L) 135 - 145 mmol/L   Potassium 3.0 (L) 3.5 - 5.1 mmol/L   Chloride 94 (L) 98 - 111 mmol/L   CO2 25 22 - 32 mmol/L   Glucose, Bld 98 70 - 99 mg/dL    Comment: Glucose reference range applies only to samples taken after fasting for at least 8 hours.   BUN 11 8 - 23 mg/dL   Creatinine, Ser 0.57 0.44 - 1.00 mg/dL   Calcium 9.4 8.9 - 10.3 mg/dL   Total Protein 7.4 6.5 - 8.1 g/dL   Albumin 3.7 3.5 - 5.0 g/dL   AST 24 15 - 41 U/L   ALT 18 0 - 44 U/L   Alkaline Phosphatase 85 38 - 126 U/L   Total Bilirubin 1.2  0.3 - 1.2 mg/dL   GFR, Estimated >60 >60 mL/min    Comment: (NOTE) Calculated using the CKD-EPI Creatinine Equation (2021)    Anion gap 15 5 - 15     Comment: Performed at [facility], [address], [place], [place] [address]  Lipase, blood     Status: None   Collection Time: 02/12/21  1:08 AM  Result Value Ref Range   Lipase 32 11 - 51 U/L    Comment: Performed at [facility], [address], [place], [place] [address]  Troponin I (High Sensitivity)     Status: Abnormal   Collection Time: 02/12/21  1:08 AM  Result Value Ref Range   Troponin I (High Sensitivity) 19 (H) <18 ng/L    Comment: (NOTE) Elevated high sensitivity troponin I (hsTnI) values and significant  changes across serial measurements may suggest ACS but many other  chronic and acute conditions are known to elevate hsTnI results.  Refer to the "Links" section for chest pain algorithms and additional  guidance. Performed at [facility], [address], [place], [place] [address]   Troponin I (High Sensitivity)     Status: Abnormal   Collection Time: 02/12/21  2:47 AM  Result Value Ref Range   Troponin I (High Sensitivity) 21 (H) <18 ng/L    Comment: (NOTE) Elevated high sensitivity troponin I (hsTnI) values and significant  changes across serial measurements may suggest ACS but many other  chronic and acute conditions are known to elevate hsTnI results.  Refer to the "Links" section for chest pain algorithms and additional  guidance. Performed at [facility], [address] [place], [place] [address]     Chemistries  Recent Labs  Lab 02/12/21 0108  NA 134*  K 3.0*  CL 94*  CO2 25  GLUCOSE 98  BUN 11  CREATININE 0.57  CALCIUM 9.4  AST 24  ALT 18  ALKPHOS 85  BILITOT 1.2   ------------------------------------------------------------------------------------------------------------------  ------------------------------------------------------------------------------------------------------------------ GFR: CrCl cannot be calculated (Unknown ideal weight.). Liver Function Tests: Recent Labs  Lab 02/12/21 0108  AST 24  ALT 18  ALKPHOS 85   BILITOT 1.2  PROT 7.4  ALBUMIN 3.7   Recent Labs  Lab 02/12/21 0108  LIPASE 32   No results for input(s): AMMONIA in the last 168 hours. Coagulation Profile: No results for input(s): INR, PROTIME in the last 168 hours. Cardiac Enzymes: No results for input(s): CKTOTAL, CKMB, CKMBINDEX, TROPONINI in the last 168 hours. BNP (last 3 results) No results for input(s): PROBNP in the last 8760 hours. HbA1C: No results for input(s): HGBA1C in the last 72 hours. CBG: No results for input(s): GLUCAP in the last 168 hours. Lipid Profile: No results for input(s): CHOL, HDL, LDLCALC, TRIG, CHOLHDL, LDLDIRECT in the last 72 hours. Thyroid Function Tests: No results for input(s): TSH, T4TOTAL, FREET4, T3FREE, THYROIDAB in the last 72 hours. Anemia Panel: No results for input(s): VITAMINB12, FOLATE, FERRITIN, TIBC, IRON, RETICCTPCT in the last 72 hours.  --------------------------------------------------------------------------------------------------------------- Urine analysis:    Component Value Date/Time   COLORURINE YELLOW 10/11/2020 0017   APPEARANCEUR CLEAR 10/11/2020 0017   APPEARANCEUR Clear 07/26/2017 1636   LABSPEC 1.011 10/11/2020 0017   PHURINE 6.0 10/11/2020 0017   GLUCOSEU NEGATIVE 10/11/2020 0017   HGBUR NEGATIVE 10/11/2020 0017   BILIRUBINUR NEGATIVE 10/11/2020 0017   BILIRUBINUR Negative 07/26/2017 1636   KETONESUR 80 (A) 10/11/2020 0017   PROTEINUR 100 (A) 10/11/2020 0017   NITRITE NEGATIVE 10/11/2020 0017   LEUKOCYTESUR NEGATIVE 10/11/2020  0017      Imaging Results:    CT Angio Chest Pulmonary Embolism (PE) W or WO Contrast  Result Date: 02/12/2021 CLINICAL DATA:  65 year old female with nausea, chest pain, pain between the shoulder blades. 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 EXAM: CT ANGIOGRAPHY CHEST WITH CONTRAST TECHNIQUE: Multidetector CT imaging of the chest was performed using the standard protocol during bolus  administration of intravenous contrast. Multiplanar CT image reconstructions and MIPs were obtained to evaluate the vascular anatomy. CONTRAST:  165mL OMNIPAQUE IOHEXOL 350 MG/ML SOLN COMPARISON:  Portable chest 0122 hours. Chest CTA 08/25/2020. CT Chest, Abdomen, and Pelvis 11/26/2020. FINDINGS: Cardiovascular: Good contrast bolus timing in the pulmonary arterial tree. No focal filling defect identified in the pulmonary arteries to suggest acute pulmonary embolism. Calcified coronary artery atherosclerosis. Calcified aortic atherosclerosis. No cardiomegaly or pericardial effusion. Mediastinum/Nodes: No mediastinal mass or lymphadenopathy. Postoperative changes at the right hilum. Lungs/Pleura: Postoperative changes of right upper lobectomy. Underlying emphysema. Architectural distortion about the right hilum. Small volume retained secretions in the right mainstem bronchus and bronchus intermedius. Stable lung volumes and ventilation compared to September. Occasional mild right lower lobe subpleural scarring or atelectasis. No pleural effusion. Upper Abdomen: Negative visible spleen, adrenal glands, left kidney, and bowel in the upper abdomen. Chronic intra and extrahepatic biliary ductal dilatation partially visible and stable. Stable partially visible gallbladder. Chronic pancreatic atrophy with prominence of the main pancreatic duct partially visible and stable. Musculoskeletal: Osteopenia. Previous cervical ACDF. Chronic midthoracic spinal stimulator. No acute or suspicious osseous lesion identified. Review of the MIP images confirms the above findings. IMPRESSION: 1. No evidence of acute pulmonary embolus. 2. Small volume retained secretions in the right mainstem bronchus. Otherwise stable right upper lobectomy, Emphysema (ICD10-J43.9). 3. Calcified coronary artery and Aortic Atherosclerosis (ICD10-I70.0). Electronically Signed   By: Genevie Ann M.D.   On: 02/12/2021 04:46   DG Chest Port 1 View  Result Date:  02/12/2021 CLINICAL DATA:  Chest pain EXAM: PORTABLE CHEST 1 VIEW COMPARISON:  01/04/2021 FINDINGS: Postsurgical changes in the right hemithorax. Nodular opacity in the right perihilar region likely reflect scarring when correlating with prior CT. Left lung is essentially clear. Mild blunting of the right costophrenic angle, likely postsurgical. No definite pleural effusion or pneumothorax. The heart is normal in size.  Thoracic aortic atherosclerosis. Thoracic spine stimulator. IMPRESSION: Postsurgical changes in the right hemithorax. No evidence of acute cardiopulmonary disease. Electronically Signed   By: Julian Hy M.D.   On: 02/12/2021 01:44       Assessment & Plan:    Principal Problem:   Intractable pain Active Problems:   HTN (hypertension)   COPD GOLD II    Tobacco abuse   Hypokalemia   Chronic respiratory failure with hypoxia (HCC)   GERD (gastroesophageal reflux disease)   Intractable pain and nausea CT PE shows no etiology that would explain the symptoms Patient given Reglan in the ED, nausea is improved Dilaudid 1 mg x 2 improved pain briefly but then pain returned Try GI cocktail Lipase normal Check right upper quadrant ultrasound with history of bile duct stone Check a lactic acid -if significantly elevated consider CTA abdomen There is some concern for withdrawal symptoms as patient has not been able to keep down her medications-continue as needed opiate via IV for pain Continue to monitor Hypokalemia Secondary to GI losses Potassium 3.0 Replace and recheck Hypertension Continue Norvasc With tachycardia give one-time dose of Lopressor 5 mg IV Continue to monitor COPD  Continue Symbicort Continue to monitor Not in exacerbation at this time Tobacco use disorder Counseled on the importance of cessation Continue nicotine patch Chronic respiratory failure with hypoxia Secondary to COPD Continue baseline nasal cannula GERD Continue PPI but change to IV  given the nausea and vomiting    DVT Prophylaxis-   Heparin - SCDs   AM Labs Ordered, also please review Full Orders  Family Communication: No family at bedside  Code Status: Full  Admission status: Observation Disposition: Anticipated Discharge date 24 hours Discharge to Home  Time spent in minutes : [place] DO

## 2021-02-12 NOTE — ED Notes (Signed)
Pt stated she needs to urinate, Pt stated she was to weak to ambulate. Placing purewick @0330 .

## 2021-02-12 NOTE — Progress Notes (Signed)
Patient seen and examined, admitted after midnight with abd pain and intractable N/V ongoing for over 4 days prior to admission. Patient unable to take meds, food or liquids without experiencing nausea/vomiting events. Hemodynamically stable otherwise and presenting signs of dehydration and hypokalemia from GI loses.  Please refer to H&P written by DR. Zierle-ghosh for further info/details on admission.  Plan: -continue IVF's resuscitation and electrolytes repletion -NPO for bowel rest currently -continue analgesics and antiemetics -follow clinical response.  Barton Dubois MD [phone]

## 2021-02-12 NOTE — ED Notes (Signed)
Pt given cup of ice per request

## 2021-02-12 NOTE — ED Notes (Signed)
Pt gone to CT 

## 2021-02-12 NOTE — ED Provider Notes (Signed)
[facility] Provider Note   CSN: [id number] Arrival date & time: 02/11/21  2153     History Chief Complaint  Patient presents with   Nausea    Jasmine [name] is a 65 y.o. female.  Patient presents to the emergency department for evaluation of nausea and vomiting.  Patient reports that symptoms have been ongoing for 3 days.  Patient reports that she was trying to keep up with oral fluid intake for the first 2 days but has not even been able to hold down sips today.  She has been trying to use Zofran at home but it has not helped.  Patient not experiencing any significant abdominal pain.  She has, however, began to experience central chest pain radiating into the center of her back tonight.  She thinks it started secondary to the vomiting.  She does have a cough which is progressively worsening.  This is a chronic cough secondary to her COPD.  Patient has a history of chronic pain syndrome and normally takes fairly large doses of opioid analgesics, has not had a dose since 5 PM.      Past Medical History:  Diagnosis Date   Allergy    Anemia    Anxiety    Arthritis    Asthma    Carpal tunnel syndrome    Chronic back pain    Cigarette smoker 08/31/3660   Complication of anesthesia    in the past has had N/V, the last few surgeries she has not been   COPD (chronic obstructive pulmonary disease) ([place])    Easy bruising 07/10/2016   GERD (gastroesophageal reflux disease)    Headache    Heart murmur 2005   never had any problems   History of blood transfusion 2018   History of kidney stones    Hypertension    Hypoglycemia    Hypoglycemia    Iron deficiency anemia 07/11/2016   Neuropathy    both arms   Normocytic anemia 07/29/2015   Pancreatitis    Pneumonia    PONV (postoperative nausea and vomiting)    Postoperative anemia due to acute blood loss 11/15/2015   R lung cancer    Sciatica    Seizures ([place])    as a child when she would have an asthma attack.  none as an adult   Shortness of breath dyspnea     Patient Active Problem List   Diagnosis Date Noted   Squamous cell lung cancer, right ([place]) 01/13/2021   Atypical chest pain 11/26/2020   Elevated MCV 11/26/2020   Nausea & vomiting 11/26/2020   History of Clostridioides difficile infection 11/26/2020   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[place]) 10/11/2020   Gastroenteritis 10/11/2020   Acute gastroenteritis 10/10/2020   Protein-calorie malnutrition, severe 08/26/2020   Chest pain 08/25/2020   Pleuritic chest pain 08/25/2020   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   GERD (gastroesophageal reflux disease) 04/22/2020   Abnormal CT of the abdomen 04/22/2020   Preventative health care 04/22/2020   Solitary pulmonary nodule 03/22/2020   Pulmonary infiltrate on chest x-ray 02/24/2020   Nocturnal cough 02/19/2020   Pancreatitis, acute 01/06/2019   Acute pancreatitis 01/05/2019   Cigarette smoker 08/25/2017   Chronic respiratory failure with hypoxia ([place]) 08/25/2017   Centrilobular emphysema ([place]) 07/09/2017   Aortic atherosclerosis ([place]) 07/09/2017   Hepatic steatosis 07/09/2017   Malnutrition of moderate degree 07/02/2017   Acute respiratory failure with hypoxia ([place]) 06/30/2017   COPD with acute exacerbation ([place]) 06/30/2017  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/2016   Tobacco abuse 08/04/2016   Iron deficiency anemia 07/11/2016   Easy bruising 07/10/2016   Lumbar stenosis 11/11/2015   Leg pain 09/24/2015   Vitamin D deficiency 07/29/2015   Chronic back pain 02/22/2015   HTN (hypertension) 02/22/2015   Chronic narcotic use 02/22/2015   COPD GOLD II  02/22/2015   Headache 02/22/2015    Past Surgical History:  Procedure Laterality Date   ABDOMINAL HYSTERECTOMY  2595   APPLICATION OF ROBOTIC ASSISTANCE FOR SPINAL PROCEDURE N/A 09/05/2016   Procedure: APPLICATION OF ROBOTIC ASSISTANCE FOR SPINAL PROCEDURE;  Surgeon: Consuella Lose, MD;  Location: [facility];  Service: Neurosurgery;  Laterality:  N/A;   BACK SURGERY     BIOPSY  06/14/2020   Procedure: BIOPSY;  Surgeon: Daneil Dolin, MD;  [facility];  Service: Endoscopy;;   [place]   COLONOSCOPY     unsure last   COLONOSCOPY WITH PROPOFOL N/A 06/14/2020   Procedure: COLONOSCOPY WITH PROPOFOL;  Surgeon: Daneil Dolin, MD;  [facility];  Service: Endoscopy;  Laterality: N/A;  PM   cyst removal face     disk repair     neck and lumbar region   ESOPHAGOGASTRODUODENOSCOPY (EGD) WITH PROPOFOL N/A 06/14/2020   Procedure: ESOPHAGOGASTRODUODENOSCOPY (EGD) WITH PROPOFOL;  Surgeon: Daneil Dolin, MD;  [facility];  Service: Endoscopy;  Laterality: N/A;   FOOT SURGERY Bilateral    to file bones down    HARDWARE REMOVAL N/A 09/05/2016   Procedure: HARDWARE REMOVAL AND REPLACEMENT OF LUMBAR FIVE SCREWS;  Surgeon: Consuella Lose, MD;  Location: [facility];  Service: Neurosurgery;  Laterality: N/A;   IMPLANTATION / PLACEMENT EPIDURAL NEUROSTIMULATOR ELECTRODES     INTERCOSTAL NERVE BLOCK Right 07/29/2020   Procedure: INTERCOSTAL NERVE BLOCK;  Surgeon: Melrose Nakayama, MD;  Location: [facility];  Service: Thoracic;  Laterality: Right;   LAMINECTOMY     2006   lobectomy Right 07/2020   LUMBAR FUSION     LYMPH NODE BIOPSY Right 07/29/2020   Procedure: LYMPH NODE BIOPSY;  Surgeon: Melrose Nakayama, MD;  Location: [facility];  Service: Thoracic;  Laterality: Right;   POLYPECTOMY  06/14/2020   Procedure: POLYPECTOMY INTESTINAL;  Surgeon: Daneil Dolin, MD;  [facility];  Service: Endoscopy;;   SPINAL CORD STIMULATOR BATTERY EXCHANGE     SPINAL CORD STIMULATOR INSERTION N/A 07/05/2016   Procedure: REPLACEMENT OF LUMBAR SPINAL CORD STIMULATOR BATTERY;  Surgeon: Newman Pies, MD;  Location: [facility];  Service: Neurosurgery;  Laterality: N/A;   TONSILLECTOMY       OB History   No obstetric history on file.     Family History  Problem Relation Age of  Onset   Heart disease Mother    Diabetes Mother    Hypertension Mother    Hyperlipidemia Mother    COPD Mother        smoked   Cancer Mother        bile duct   COPD Father    Diabetes Father    Heart disease Father    Cancer Father        throat   Dementia Father    Asthma Son    Hypertension Sister    Hyperlipidemia Sister    Hypertension Brother    Hyperlipidemia Brother    Hypertension Brother    Hyperlipidemia Brother    Emphysema Maternal Grandmother  smoked   Colon cancer Neg Hx    Colon polyps Neg Hx     Social History   Tobacco Use   Smoking status: Every Day    Packs/day: 0.25    Years: 35.00    Pack years: 8.75    Types: Cigarettes   Smokeless tobacco: Never   Tobacco comments:    trying to quit  Vaping Use   Vaping Use: Never used  Substance Use Topics   Alcohol use: Yes    Alcohol/week: 2.0 standard drinks    Types: 2 Glasses of wine per week    Comment: occasionally   Drug use: No    Home Medications Prior to Admission medications   Medication Sig Start Date End Date Taking? Authorizing Provider  albuterol (VENTOLIN HFA) 108 (90 Base) MCG/ACT inhaler Inhale into the lungs every 6 (six) hours as needed for wheezing or shortness of breath. Patient not taking: Reported on 01/13/2021    [provider]  amLODipine (NORVASC) 10 MG tablet Take 1 tablet (10 mg total) by mouth daily. 07/09/20   Janora Norlander, DO  atorvastatin (LIPITOR) 40 MG tablet Take 1 tablet (40 mg total) by mouth daily. 07/09/20   Ronnie Doss M, DO  budesonide-formoterol (SYMBICORT) 160-4.5 MCG/ACT inhaler USE 2 INHALATIONS TWICE A DAY 01/17/21   Ronnie Doss M, DO  Cholecalciferol 125 MCG (5000 UT) capsule Take 5,000 Units by mouth daily.    [provider]  dextromethorphan-guaiFENesin (MUCINEX DM) 30-600 MG 12hr tablet Take 1 tablet by mouth 2 (two) times daily.    [provider]  diclofenac sodium (VOLTAREN) 1 % GEL Apply 1  application topically daily as needed for muscle pain. Patient not taking: Reported on 01/13/2021 06/30/16   [provider]  DULoxetine (CYMBALTA) 30 MG capsule Take 3 capsules (90 mg total) by mouth daily. 07/09/20   Janora Norlander, DO  ipratropium-albuterol (DUONEB) 0.5-2.5 (3) MG/3ML SOLN Inhale 3 mLs into the lungs 4 (four) times daily as needed (for shortness of breath/wheezing). *May use as needed Patient not taking: Reported on 01/13/2021 02/21/16   [provider]  Iron Polysacch Cmplx-B12-FA (POLY-IRON 150 FORTE) 150-0.025-1 MG CAPS Take 1 capsule by mouth 2 (two) times daily. 05/27/20   Derek Jack, MD  L-Methylfolate-Algae-B12-B6 [facility]) 3-90.314-2-35 MG CAPS TAKE 1 TABLET BY MOUTH TWO TIMES A DAY Patient taking differently: Take 1 tablet by mouth in the morning and at bedtime. 09/10/19   Janora Norlander, DO  magnesium oxide (MAG-OX) 400 (241.3 Mg) MG tablet Take 400 mg by mouth 2 (two) times daily. 02/07/20   [provider]  mirabegron ER (MYRBETRIQ) 25 MG TB24 tablet Take 1 tablet (25 mg total) by mouth daily. 01/10/21   Janora Norlander, DO  nicotine (NICODERM CQ) 14 mg/24hr patch Place 1 patch (14 mg total) onto the skin daily. 06/23/20   Janora Norlander, DO  Nutritional Supplements (ENSURE HIGH PROTEIN) LIQD Drink 1 can 3 times daily. 08/30/20   Janora Norlander, DO  omeprazole (PRILOSEC) 40 MG capsule Take one capsule once to twice daily before a meal. 01/18/21   Mahala Menghini, PA-C  ondansetron (ZOFRAN ODT) 4 MG disintegrating tablet Take 1 tablet (4 mg total) by mouth every 8 (eight) hours as needed for nausea or vomiting. Patient not taking: Reported on 01/13/2021 09/12/20   Maudie Flakes, MD  oxyCODONE-acetaminophen (PERCOCET) 10-325 MG tablet Take 1 tablet by mouth 5 (five) times daily. 08/27/20   [provider]  OXYCONTIN 15 MG 12 hr tablet Take 15 mg by mouth every 12 (twelve) hours. 08/28/20   [provider]   OXYGEN Inhale 3 L into the lungs See admin instructions. continuous    [provider]  SPIRIVA RESPIMAT 2.5 MCG/ACT AERS USE 2 INHALATIONS DAILY Patient taking differently: Inhale 2 puffs into the lungs daily. 07/13/20   Tanda Rockers, MD  tizanidine (ZANAFLEX) 6 MG capsule Take 6 mg by mouth 3 (three) times daily. 08/27/20   [provider]  traZODone (DESYREL) 150 MG tablet TAKE 1 TO 2 TABLETS AT BEDTIME Patient taking differently: Take 150-300 mg by mouth at bedtime. 02/02/20   Janora Norlander, DO    Allergies    Lyrica [pregabalin], Darvon [propoxyphene], Gabapentin, Augmentin [amoxicillin-pot clavulanate], Erythromycin, and Vibramycin [doxycycline calcium]  Review of Systems   Review of Systems  Constitutional:  Negative for fever.  Respiratory:  Positive for cough.   Cardiovascular:  Positive for chest pain.  Gastrointestinal:  Positive for nausea and vomiting.  Musculoskeletal:  Positive for back pain.  All other systems reviewed and are negative.  Physical Exam Updated Vital Signs BP (!) 150/106 (BP Location: Right Arm)   Pulse (!) 122   Temp 99 F (37.2 C) (Oral)   Resp 15   SpO2 100%   Physical Exam Vitals and nursing note reviewed.  Constitutional:      General: She is not in acute distress.    Appearance: Normal appearance. She is well-developed.  HENT:     Head: Normocephalic and atraumatic.     Right Ear: Hearing normal.     Left Ear: Hearing normal.     Nose: Nose normal.  Eyes:     Conjunctiva/sclera: Conjunctivae normal.     Pupils: Pupils are equal, round, and reactive to light.  Cardiovascular:     Rate and Rhythm: Regular rhythm.     Heart sounds: S1 normal and S2 normal. No murmur heard.   No friction rub. No gallop.  Pulmonary:     Effort: Pulmonary effort is normal. No respiratory distress.     Breath sounds: Normal breath sounds.  Chest:     Chest wall: No tenderness.  Abdominal:     General: Bowel sounds are normal.      Palpations: Abdomen is soft.     Tenderness: There is no abdominal tenderness. There is no guarding or rebound. Negative signs include Murphy's sign and McBurney's sign.     Hernia: No hernia is present.  Musculoskeletal:        General: Normal range of motion.     Cervical back: Normal range of motion and neck supple.  Skin:    General: Skin is warm and dry.     Findings: No rash.  Neurological:     Mental Status: She is alert and oriented to person, place, and time.     GCS: GCS eye subscore is 4. GCS verbal subscore is 5. GCS motor subscore is 6.     Cranial Nerves: No cranial nerve deficit.     Sensory: No sensory deficit.     Coordination: Coordination normal.  Psychiatric:        Speech: Speech normal.        Behavior: Behavior normal.        Thought Content: Thought content normal.    ED Results / Procedures / Treatments   Labs (all labs ordered are listed, but only abnormal results are displayed) Labs Reviewed  CBC WITH DIFFERENTIAL/PLATELET -  Abnormal; Notable for the following components:      Result Value   MCV 103.5 (*)    All other components within normal limits  COMPREHENSIVE METABOLIC PANEL - Abnormal; Notable for the following components:   Sodium 134 (*)    Potassium 3.0 (*)    Chloride 94 (*)    All other components within normal limits  TROPONIN I (HIGH SENSITIVITY) - Abnormal; Notable for the following components:   Troponin I (High Sensitivity) 19 (*)    All other components within normal limits  TROPONIN I (HIGH SENSITIVITY) - Abnormal; Notable for the following components:   Troponin I (High Sensitivity) 21 (*)    All other components within normal limits  RESP PANEL BY RT-PCR (FLU A&B, COVID) [id number]  LIPASE, BLOOD  URINALYSIS, ROUTINE W REFLEX MICROSCOPIC    EKG EKG Interpretation  Date/Time:  Friday February 11 2021 22:03:01 EST Ventricular Rate:  118 PR Interval:  166 QRS Duration: 84 QT Interval:  322 QTC Calculation: 452 R  Axis:   55 Text Interpretation: Sinus tachycardia LAE, consider biatrial enlargement Left ventricular hypertrophy Confirmed by Orpah Greek [id number]) on 02/12/2021 12:55:41 AM  Radiology CT Angio Chest Pulmonary Embolism (PE) W or WO Contrast  Result Date: 02/12/2021 CLINICAL DATA:  65 year old female with nausea, chest pain, pain between the shoulder blades. 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 EXAM: CT ANGIOGRAPHY CHEST WITH CONTRAST TECHNIQUE: Multidetector CT imaging of the chest was performed using the standard protocol during bolus administration of intravenous contrast. Multiplanar CT image reconstructions and MIPs were obtained to evaluate the vascular anatomy. CONTRAST:  177mL OMNIPAQUE IOHEXOL 350 MG/ML SOLN COMPARISON:  Portable chest 0122 hours. Chest CTA 08/25/2020. CT Chest, Abdomen, and Pelvis 11/26/2020. FINDINGS: Cardiovascular: Good contrast bolus timing in the pulmonary arterial tree. No focal filling defect identified in the pulmonary arteries to suggest acute pulmonary embolism. Calcified coronary artery atherosclerosis. Calcified aortic atherosclerosis. No cardiomegaly or pericardial effusion. Mediastinum/Nodes: No mediastinal mass or lymphadenopathy. Postoperative changes at the right hilum. Lungs/Pleura: Postoperative changes of right upper lobectomy. Underlying emphysema. Architectural distortion about the right hilum. Small volume retained secretions in the right mainstem bronchus and bronchus intermedius. Stable lung volumes and ventilation compared to September. Occasional mild right lower lobe subpleural scarring or atelectasis. No pleural effusion. Upper Abdomen: Negative visible spleen, adrenal glands, left kidney, and bowel in the upper abdomen. Chronic intra and extrahepatic biliary ductal dilatation partially visible and stable. Stable partially visible gallbladder. Chronic pancreatic atrophy with prominence of the main pancreatic duct  partially visible and stable. Musculoskeletal: Osteopenia. Previous cervical ACDF. Chronic midthoracic spinal stimulator. No acute or suspicious osseous lesion identified. Review of the MIP images confirms the above findings. IMPRESSION: 1. No evidence of acute pulmonary embolus. 2. Small volume retained secretions in the right mainstem bronchus. Otherwise stable right upper lobectomy, Emphysema (ICD10-J43.9). 3. Calcified coronary artery and Aortic Atherosclerosis (ICD10-I70.0). Electronically Signed   By: Genevie Ann M.D.   On: 02/12/2021 04:46   DG Chest Port 1 View  Result Date: 02/12/2021 CLINICAL DATA:  Chest pain EXAM: PORTABLE CHEST 1 VIEW COMPARISON:  01/04/2021 FINDINGS: Postsurgical changes in the right hemithorax. Nodular opacity in the right perihilar region likely reflect scarring when correlating with prior CT. Left lung is essentially clear. Mild blunting of the right costophrenic angle, likely postsurgical. No definite pleural effusion or pneumothorax. The heart is normal in size.  Thoracic aortic atherosclerosis. Thoracic spine stimulator. IMPRESSION: Postsurgical changes in the right  hemithorax. No evidence of acute cardiopulmonary disease. Electronically Signed   By: Julian Hy M.D.   On: 02/12/2021 01:44    Procedures Procedures   Medications Ordered in ED Medications  diphenhydrAMINE (BENADRYL) injection 25 mg (has no administration in time range)  HYDROmorphone (DILAUDID) injection 1 mg (has no administration in time range)  metoCLOPramide (REGLAN) injection 10 mg (has no administration in time range)  sodium chloride 0.9 % bolus 1,000 mL (1,000 mLs Intravenous New Bag/Given 02/12/21 0112)  metoCLOPramide (REGLAN) injection 10 mg (10 mg Intravenous Given 02/12/21 0113)  diphenhydrAMINE (BENADRYL) injection 25 mg (25 mg Intravenous Given 02/12/21 0113)  HYDROmorphone (DILAUDID) injection 1 mg (1 mg Intravenous Given 02/12/21 0113)  iohexol (OMNIPAQUE) 350 MG/ML injection  100 mL (100 mLs Intravenous Contrast Given 02/12/21 0411)  HYDROmorphone (DILAUDID) injection 1 mg (1 mg Intravenous Given 02/12/21 0353)    ED Course  I have reviewed the triage vital signs and the nursing notes.  Pertinent labs & imaging results that were available during my care of the patient were reviewed by me and considered in my medical decision making (see chart for details).    MDM Rules/Calculators/A&P                            Patient presents to the emergency department for evaluation of nausea and vomiting.  Patient reports that her symptoms have been ongoing for a number of days.  Patient cannot hold anything down.  She is not experiencing any specific abdominal pain, just for nausea and vomiting preventing her from taking anything by mouth.  She does have a history of chronic pain syndrome and likely has not been holding down her pain medications.  There is likely some element of dehydration as well as possible withdrawal.  She is tachycardic at arrival.  Patient is hypertensive, not hypoxic.  She is experiencing pain specifically in the central portion of her chest that radiates into the back.  This is associated with shortness of breath.  Patient thinks that this began after vomiting.  CT angiography of chest was performed.  No evidence of PE.  No features that would suggest esophageal rupture.  Patient persistently tachycardic with uncontrolled pain despite multiple doses of Dilaudid.  Troponins are very slightly elevated, likely leak, not consistent with ACS or NSTEMI.  Will admit for further management.  CRITICAL CARE Performed by: Orpah Greek   Total critical care time: 33 minutes  Critical care time was exclusive of separately billable procedures and treating other patients.  Critical care was necessary to treat or prevent imminent or life-threatening deterioration.  Critical care was time spent personally by me on the following activities: development of  treatment plan with patient and/or surrogate as well as nursing, discussions with consultants, evaluation of patient's response to treatment, examination of patient, obtaining history from patient or surrogate, ordering and performing treatments and interventions, ordering and review of laboratory studies, ordering and review of radiographic studies, pulse oximetry and re-evaluation of patient's condition.  Final Clinical Impression(s) / ED Diagnoses Final diagnoses:  Nausea and vomiting, unspecified vomiting type  Chest pain, unspecified type    Rx / DC Orders ED Discharge Orders     None        Orpah Greek, MD 02/12/21 [phone]

## 2021-02-12 NOTE — Progress Notes (Signed)
Pt arrived to room #305 via stretcher from ED. Pt able to move self from stretcher to bed, tolerated well without increased SOB. Pt c/o lower abd fullness and feeling of full bladder. Pt noted to have adult pull-up on that is saturated with urine. Pull-up removed, pt cleaned, purewick placed with mesh panties and pad for securing. Pt then voided 300 ml of clear yellow urine. Lower abd then soft and pt denies further feeling of fullness. IV site intact without s/s infiltration. Tele applied, NSR. VSS. O2 on at 3lpm [place], which pt wears at home. Pt oriented to safety procedures, states understanding. Call bell within reach, bed alarm on for safety.

## 2021-02-13 DIAGNOSIS — J449 Chronic obstructive pulmonary disease, unspecified: Secondary | ICD-10-CM

## 2021-02-13 DIAGNOSIS — K219 Gastro-esophageal reflux disease without esophagitis: Secondary | ICD-10-CM

## 2021-02-13 DIAGNOSIS — J9611 Chronic respiratory failure with hypoxia: Secondary | ICD-10-CM

## 2021-02-13 DIAGNOSIS — Z720 Tobacco use: Secondary | ICD-10-CM

## 2021-02-13 DIAGNOSIS — E876 Hypokalemia: Secondary | ICD-10-CM

## 2021-02-13 DIAGNOSIS — I10 Essential (primary) hypertension: Secondary | ICD-10-CM

## 2021-02-13 LAB — CBC WITH DIFFERENTIAL/PLATELET
Abs Immature Granulocytes: 0.02 10*3/uL (ref 0.00–0.07)
Basophils Absolute: 0.1 10*3/uL (ref 0.0–0.1)
Basophils Relative: 1 %
Eosinophils Absolute: 0.1 10*3/uL (ref 0.0–0.5)
Eosinophils Relative: 2 %
HCT: 37.7 % (ref 36.0–46.0)
Hemoglobin: 11.4 g/dL — ABNORMAL LOW (ref 12.0–15.0)
Immature Granulocytes: 0 %
Lymphocytes Relative: 30 %
Lymphs Abs: 1.3 10*3/uL (ref 0.7–4.0)
MCH: 31.8 pg (ref 26.0–34.0)
MCHC: 30.2 g/dL (ref 30.0–36.0)
MCV: 105 fL — ABNORMAL HIGH (ref 80.0–100.0)
Monocytes Absolute: 0.9 10*3/uL (ref 0.1–1.0)
Monocytes Relative: 20 %
Neutro Abs: 2.2 10*3/uL (ref 1.7–7.7)
Neutrophils Relative %: 47 %
Platelets: 257 10*3/uL (ref 150–400)
RBC: 3.59 MIL/uL — ABNORMAL LOW (ref 3.87–5.11)
RDW: 14.2 % (ref 11.5–15.5)
WBC: 4.6 10*3/uL (ref 4.0–10.5)
nRBC: 0 % (ref 0.0–0.2)

## 2021-02-13 LAB — COMPREHENSIVE METABOLIC PANEL
ALT: 13 U/L (ref 0–44)
AST: 18 U/L (ref 15–41)
Albumin: 2.9 g/dL — ABNORMAL LOW (ref 3.5–5.0)
Alkaline Phosphatase: 64 U/L (ref 38–126)
Anion gap: 8 (ref 5–15)
BUN: 7 mg/dL — ABNORMAL LOW (ref 8–23)
CO2: 30 mmol/L (ref 22–32)
Calcium: 9 mg/dL (ref 8.9–10.3)
Chloride: 100 mmol/L (ref 98–111)
Creatinine, Ser: 0.56 mg/dL (ref 0.44–1.00)
GFR, Estimated: >60 mL/min (ref >60)
Glucose, Bld: 118 mg/dL — ABNORMAL HIGH (ref 70–99)
Potassium: 3 mmol/L — ABNORMAL LOW (ref 3.5–5.1)
Sodium: 138 mmol/L (ref 135–145)
Total Bilirubin: 1 mg/dL (ref 0.3–1.2)
Total Protein: 5.9 g/dL — ABNORMAL LOW (ref 6.5–8.1)

## 2021-02-13 LAB — MAGNESIUM: Magnesium: 1.7 mg/dL (ref 1.7–2.4)

## 2021-02-13 MED ORDER — MAGNESIUM SULFATE 50 % IJ SOLN: 1.0000 g | Freq: Once | INTRAMUSCULAR | Status: DC

## 2021-02-13 MED ORDER — MORPHINE SULFATE (PF) 4 MG/ML IV SOLN
4.0000 mg | INTRAVENOUS | Status: DC | PRN
  Administered 2021-02-13 – 2021-02-16 (×22): 4 mg via INTRAVENOUS
  Filled 2021-02-13 (×22): qty 1

## 2021-02-13 MED ORDER — METOCLOPRAMIDE HCL 5 MG/ML IJ SOLN
5.0000 mg | Freq: Three times a day (TID) | INTRAMUSCULAR | Status: AC
  Administered 2021-02-13 – 2021-02-15 (×6): 5 mg via INTRAVENOUS
  Filled 2021-02-13 (×6): qty 2

## 2021-02-13 MED ORDER — MAGNESIUM SULFATE IN D5W 1-5 GM/100ML-% IV SOLN
1.0000 g | Freq: Once | INTRAVENOUS | Status: AC
  Administered 2021-02-13: 1 g via INTRAVENOUS
  Filled 2021-02-13: qty 100

## 2021-02-13 MED ORDER — PANCRELIPASE (LIP-PROT-AMYL) 12000-38000 UNITS PO CPEP
36000.0000 [IU] | ORAL_CAPSULE | Freq: Three times a day (TID) | ORAL | Status: DC
  Administered 2021-02-13 – 2021-02-16 (×9): 36000 [IU] via ORAL
  Filled 2021-02-13 (×9): qty 3

## 2021-02-13 MED ORDER — HYDROCODONE-ACETAMINOPHEN 5-325 MG PO TABS
2.0000 | ORAL_TABLET | ORAL | Status: DC | PRN
  Administered 2021-02-13 – 2021-02-16 (×10): 2 via ORAL
  Filled 2021-02-13 (×10): qty 2

## 2021-02-13 NOTE — Progress Notes (Signed)
Patient requesting Morphine 4mg  and Norco 10/650 to be taken at the same time. She stated that she has been taking them together all day. I reviewed the [facility] and she had taken the pain medication together twice on day shift as well as 3 other doses of Morphine.

## 2021-02-13 NOTE — Progress Notes (Signed)
PROGRESS NOTE    Jasmine [name]  DPO:[id number] DOB: [birth year] DOA: 02/11/2021 PCP: Janora Norlander, DO    Chief Complaint  Patient presents with   Nausea    Brief Narrative:  As per H&P written by Dr. Clearence Ped on 02/12/21 Jasmine [name]  is a 65 y.o. female, with history of anemia, anxiety, chronic back pain, tobacco use disorder, COPD, history of lung cancer status post lobectomy, GERD, pancreatitis, seizures, and more presents ED with a chief complaint of intractable pain and vomiting.  Patient reports that she has had constant vomiting, chest pain, and shoulder blade pain.  The vomiting started first and has been ongoing for 3 days.  She reports she cannot tolerate any p.o. intake-neither solids nor liquids.  Her last normal meal was 4 days ago.  Her last normal bowel movement was also 4 days ago.  She did have a tiny bowel movement 3 days ago, but that was not normal for her.  Patient denies any abdominal pain, but when he asked her to point to her chest pain she points to her epigastric region and lower chest.  She reports that the pain radiates towards her right breast and right upper quadrant.  Her chest pain is worse right after vomiting.  She reports it feels like a punching pain.  It is intermittent, but has been more frequent over the days.  She takes opiates at home for spine, neck, hip pain.  She has been taking these opiates and the pain is not been relieved in her chest.  She thinks that shoulder blade pain is the same as the chest pain, they are not separate.  He does not feel like the pain is radiating through to her back either.  Patient is not sure if she has had her gallbladder out or not.  It looks like she had a CT scan done earlier this year that commented on a dilated common bile duct with a possible stone.  Patient reports that she has had generalized weakness has been progressive over the last 3 days.  She has no history of CHF.  Patient is on 3 L nasal cannula at  home secondary to COPD and lobectomy.  Patient currently still smokes 5 cigarettes a day.  She does not drink alcohol, has not use marijuana, and is vaccinated for COVID.  Patient is full code.  She reports she has a living will that makes her PCP her decision maker.   In the ED Temp 98.6, heart rate 98-1 22, respiratory rate 10-27, blood pressure as high as 180/100 No leukocytosis at 7.4, hemoglobin 12.7, platelets 362 Chemistry panel reveals a hypokalemia 3.0 CT PE shows no PE, but retained secretions in the right main bronchus. EKG shows sinus tachycardia with a rate of 118, QTc 452, no acute ischemic changes Patient was given Benadryl, Dilaudid x2, Reglan, 1 L normal saline bolus without improvement in her pain or symptoms Admission requested for intractable nausea and vomiting as well as pain.  Assessment & Plan: 1-Intractable abd pain and nausea -continue PRN antiemetics and analgesics -symptoms suggestive of acute on chronic pancreatitis; will add pancrease -advance diet to CLD -wean off IV pain meds as tolerated -continue IVF's and supportive care. -will use Reglan and Protonix  2-HTN (hypertension) -continue PRN antihypertensive agents -resume home meds when fully able to tolerate PO's  3-COPD GOLD II/chronic resp failure with hypoxia -continue bronchodilator management  -continue chronic O2 supplementation -no wheezing on exam.  4-Tobacco abuse -cessation counseling provided  5-depression -continue cymbalta  6-Hypokalemia -in the setting of GI loses and decrease oral intake -will continue repletion and follow trend   7-chronic pain -will continue analgesics as mentioned above -follow response and resume home meds as tolerated.   DVT prophylaxis: SCD's Code Status: Full Code.  Family Communication: no family at bedside.  Disposition:   Status is: Inpatient    Consultants:  None   Procedures:  See below for x-ray reports.   Antimicrobials:  None     Subjective: Experiencing intermittent nausea and intractable pain requiring IV analgesics. No further vomiting.   Objective: Vitals:   02/12/21 2009 02/12/21 2130 02/13/21 0500 02/13/21 0809  BP: (!) 151/88  (!) 191/105   Pulse: 94  100   Resp: 18  16   Temp: 98.7 F (37.1 C)     TempSrc: Oral     SpO2: 100% 100% 100% 100%  Weight:   42.9 kg   Height:        Intake/Output Summary (Last 24 hours) at 02/13/2021 1402 Last data filed at 02/13/2021 1300 Gross per 24 hour  Intake 1812.48 ml  Output 2150 ml  Net -337.52 ml   Filed Weights   02/12/21 1112 02/13/21 0500  Weight: 42.5 kg 42.9 kg    Examination:  General exam: Appears slightly more calmer, no CP, no fever, no vomiting. Still with ongoing pain and intermittent nausea. Good sat on chronic supplementation. Respiratory system: Clear to auscultation. Respiratory effort normal. No wheezing, no crackles. Cardiovascular system: S1 & S2 heard, RRR. No JVD, murmurs, rubs, gallops or clicks. No pedal edema. Gastrointestinal system: Abdomen is nondistended, soft and no guarding. Epigastric pain on palpation appreciated.. No organomegaly or masses felt. Normal bowel sounds heard. Central nervous system: Alert and oriented. No focal neurological deficits. Extremities: Symmetric 5 x 5 power. No cyanosis, no clubbing. Skin: No petechiae. Psychiatry: Judgement and insight appear normal. Mood & affect appropriate.     Data Reviewed: I have personally reviewed following labs and imaging studies  CBC: Recent Labs  Lab 02/12/21 0108 02/13/21 0416  WBC 7.4 4.6  NEUTROABS 5.0 2.2  HGB 12.7 11.4*  HCT 41.9 37.7  MCV 103.5* 105.0*  PLT 362 237    Basic Metabolic Panel: Recent Labs  Lab 02/12/21 0108 02/13/21 0416  NA 134* 138  K 3.0* 3.0*  CL 94* 100  CO2 25 30  GLUCOSE 98 118*  BUN 11 7*  CREATININE 0.57 0.56  CALCIUM 9.4 9.0  MG  --  1.7    GFR: Estimated Creatinine Clearance: 46.2 mL/min (by C-G formula  based on SCr of 0.56 mg/dL).  Liver Function Tests: Recent Labs  Lab 02/12/21 0108 02/13/21 0416  AST 24 18  ALT 18 13  ALKPHOS 85 64  BILITOT 1.2 1.0  PROT 7.4 5.9*  ALBUMIN 3.7 2.9*    CBG: No results for input(s): GLUCAP in the last 168 hours.   Recent Results (from the past 240 hour(s))  Resp Panel by RT-PCR (Flu A&B, Covid) Nasopharyngeal Swab     Status: None   Collection Time: 02/12/21  5:43 AM   Specimen: Nasopharyngeal Swab; Nasopharyngeal(NP) swabs in vial transport medium  Result Value Ref Range Status   SARS Coronavirus 2 by RT PCR NEGATIVE NEGATIVE Final    Comment: (NOTE) SARS-CoV-2 target nucleic acids are NOT DETECTED.  The SARS-CoV-2 RNA is generally detectable in upper respiratory specimens during the acute phase of infection. The lowest concentration of SARS-CoV-2 viral copies this assay can  detect is 138 copies/mL. A negative result does not preclude SARS-Cov-2 infection and should not be used as the sole basis for treatment or other patient management decisions. A negative result may occur with  improper specimen collection/handling, submission of specimen other than nasopharyngeal swab, presence of viral mutation(s) within the areas targeted by this assay, and inadequate number of viral copies(<138 copies/mL). A negative result must be combined with clinical observations, patient history, and epidemiological information. The expected result is Negative.  Fact Sheet for Patients:  EntrepreneurPulse.com.au  Fact Sheet for Healthcare Providers:  IncredibleEmployment.be  This test is no t yet approved or cleared by the [place] FDA and  has been authorized for detection and/or diagnosis of SARS-CoV-2 by FDA under an Emergency Use Authorization (EUA). This EUA will remain  in effect (meaning this test can be used) for the duration of the COVID-19 declaration under Section 564(b)(1) of the Act,  21 U.S.C.section 360bbb-3(b)(1), unless the authorization is terminated  or revoked sooner.       Influenza A by PCR NEGATIVE NEGATIVE Final   Influenza B by PCR NEGATIVE NEGATIVE Final    Comment: (NOTE) The Xpert Xpress SARS-CoV-2/FLU/RSV plus assay is intended as an aid in the diagnosis of influenza from Nasopharyngeal swab specimens and should not be used as a sole basis for treatment. Nasal washings and aspirates are unacceptable for Xpert Xpress SARS-CoV-2/FLU/RSV testing.  Fact Sheet for Patients: EntrepreneurPulse.com.au  Fact Sheet for Healthcare Providers: IncredibleEmployment.be  This test is not yet approved or cleared by the [place] FDA and has been authorized for detection and/or diagnosis of SARS-CoV-2 by FDA under an Emergency Use Authorization (EUA). This EUA will remain in effect (meaning this test can be used) for the duration of the COVID-19 declaration under Section 564(b)(1) of the Act, 21 U.S.C. section 360bbb-3(b)(1), unless the authorization is terminated or revoked.  Performed at [facility], [address], [place], [place] [address]      Radiology Studies: CT Angio Chest Pulmonary Embolism (PE) W or WO Contrast  Result Date: 02/12/2021 CLINICAL DATA:  65 year old female with nausea, chest pain, pain between the shoulder blades. 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 EXAM: CT ANGIOGRAPHY CHEST WITH CONTRAST TECHNIQUE: Multidetector CT imaging of the chest was performed using the standard protocol during bolus administration of intravenous contrast. Multiplanar CT image reconstructions and MIPs were obtained to evaluate the vascular anatomy. CONTRAST:  169mL OMNIPAQUE IOHEXOL 350 MG/ML SOLN COMPARISON:  Portable chest 0122 hours. Chest CTA 08/25/2020. CT Chest, Abdomen, and Pelvis 11/26/2020. FINDINGS: Cardiovascular: Good contrast bolus timing in the pulmonary arterial tree. No  focal filling defect identified in the pulmonary arteries to suggest acute pulmonary embolism. Calcified coronary artery atherosclerosis. Calcified aortic atherosclerosis. No cardiomegaly or pericardial effusion. Mediastinum/Nodes: No mediastinal mass or lymphadenopathy. Postoperative changes at the right hilum. Lungs/Pleura: Postoperative changes of right upper lobectomy. Underlying emphysema. Architectural distortion about the right hilum. Small volume retained secretions in the right mainstem bronchus and bronchus intermedius. Stable lung volumes and ventilation compared to September. Occasional mild right lower lobe subpleural scarring or atelectasis. No pleural effusion. Upper Abdomen: Negative visible spleen, adrenal glands, left kidney, and bowel in the upper abdomen. Chronic intra and extrahepatic biliary ductal dilatation partially visible and stable. Stable partially visible gallbladder. Chronic pancreatic atrophy with prominence of the main pancreatic duct partially visible and stable. Musculoskeletal: Osteopenia. Previous cervical ACDF. Chronic midthoracic spinal stimulator. No acute or suspicious osseous lesion identified. Review of the MIP  images confirms the above findings. IMPRESSION: 1. No evidence of acute pulmonary embolus. 2. Small volume retained secretions in the right mainstem bronchus. Otherwise stable right upper lobectomy, Emphysema (ICD10-J43.9). 3. Calcified coronary artery and Aortic Atherosclerosis (ICD10-I70.0). Electronically Signed   By: Genevie Ann M.D.   On: 02/12/2021 04:46   DG Chest Port 1 View  Result Date: 02/12/2021 CLINICAL DATA:  Chest pain EXAM: PORTABLE CHEST 1 VIEW COMPARISON:  01/04/2021 FINDINGS: Postsurgical changes in the right hemithorax. Nodular opacity in the right perihilar region likely reflect scarring when correlating with prior CT. Left lung is essentially clear. Mild blunting of the right costophrenic angle, likely postsurgical. No definite pleural effusion  or pneumothorax. The heart is normal in size.  Thoracic aortic atherosclerosis. Thoracic spine stimulator. IMPRESSION: Postsurgical changes in the right hemithorax. No evidence of acute cardiopulmonary disease. Electronically Signed   By: Julian Hy M.D.   On: 02/12/2021 01:44   US Abdomen Limited RUQ (LIVER/GB)  Result Date: 02/12/2021 CLINICAL DATA:  65 year old female with nausea, pain. EXAM: ULTRASOUND ABDOMEN LIMITED RIGHT UPPER QUADRANT COMPARISON:  CTA chest 0415 hours today. CT Chest, Abdomen, and Pelvis today are reported separately. 11/26/2020. Ultrasound 01/06/2019. FINDINGS: Gallbladder: Evidence of some sludge. No shadowing echogenic stones. Gallbladder size relatively normal. Gallbladder wall thickness at the upper limits of normal 2-3 mm. No pericholecystic fluid. Common bile duct: Diameter: 10 mm, chronically dilated. In 2020 the CBD measured up to 17 mm. And roughly 16 mm on the September CT Abdomen and Pelvis. No intraductal filling defect identified. Liver: Echogenic liver (image 33). No discrete liver lesion. Little to no intrahepatic biliary ductal dilatation as demonstrated by CT. Portal vein is patent on color Doppler imaging with normal direction of blood flow towards the liver. Other: Negative visible right kidney. IMPRESSION: 1. Chronically dilated CBD. No intraductal filling defect identified. 2. Gallbladder sludge, but no cholelithiasis or strong evidence of acute cholecystitis. 3. Fatty liver. Electronically Signed   By: Genevie Ann M.D.   On: 02/12/2021 09:40     Scheduled Meds:  DULoxetine  90 mg Oral Daily   heparin  5,000 Units Subcutaneous Q8H   metoCLOPramide (REGLAN) injection  5 mg Intravenous Q8H   mirabegron ER  25 mg Oral Daily   mometasone-formoterol  2 puff Inhalation BID   nicotine  14 mg Transdermal Daily   pantoprazole (PROTONIX) IV  40 mg Intravenous Q12H   traZODone  150 mg Oral QHS   Continuous Infusions:  dextrose 5 % and 0.9 % NaCl with KCl 40  mEq/L 100 mL/hr at 02/13/21 1024     LOS: 1 day    Time spent: 35 minutes    Barton Dubois, MD [facility]   To contact the attending provider between 7A-7P or the covering provider during after hours 7P-7A, please log into the web site [email or web address] and access using universal [place] password for that web site. If you do not have the password, please call the hospital operator.  02/13/2021, 2:02 PM

## 2021-02-13 NOTE — Progress Notes (Signed)
PRN Labetalol given for elevated blood pressure this morning.

## 2021-02-14 LAB — COMPREHENSIVE METABOLIC PANEL
ALT: 13 U/L (ref 0–44)
AST: 16 U/L (ref 15–41)
Albumin: 3 g/dL — ABNORMAL LOW (ref 3.5–5.0)
Alkaline Phosphatase: 65 U/L (ref 38–126)
Anion gap: 4 — ABNORMAL LOW (ref 5–15)
BUN: 6 mg/dL — ABNORMAL LOW (ref 8–23)
CO2: 30 mmol/L (ref 22–32)
Calcium: 8.9 mg/dL (ref 8.9–10.3)
Chloride: 102 mmol/L (ref 98–111)
Creatinine, Ser: 0.46 mg/dL (ref 0.44–1.00)
GFR, Estimated: >60 mL/min (ref >60)
Glucose, Bld: 129 mg/dL — ABNORMAL HIGH (ref 70–99)
Potassium: 3.1 mmol/L — ABNORMAL LOW (ref 3.5–5.1)
Sodium: 136 mmol/L (ref 135–145)
Total Bilirubin: 0.3 mg/dL (ref 0.3–1.2)
Total Protein: 6.2 g/dL — ABNORMAL LOW (ref 6.5–8.1)

## 2021-02-14 LAB — MAGNESIUM: Magnesium: 1.7 mg/dL (ref 1.7–2.4)

## 2021-02-14 MED ORDER — CHLORHEXIDINE GLUCONATE 0.12 % MT SOLN
15.0000 mL | Freq: Two times a day (BID) | OROMUCOSAL | Status: DC
  Administered 2021-02-14 – 2021-02-16 (×5): 15 mL via OROMUCOSAL
  Filled 2021-02-14 (×5): qty 15

## 2021-02-14 MED ORDER — POTASSIUM CHLORIDE CRYS ER 20 MEQ PO TBCR
40.0000 meq | EXTENDED_RELEASE_TABLET | Freq: Once | ORAL | Status: AC
  Administered 2021-02-14: 40 meq via ORAL
  Filled 2021-02-14: qty 2

## 2021-02-14 MED ORDER — OXYCODONE HCL ER 15 MG PO T12A
15.0000 mg | EXTENDED_RELEASE_TABLET | Freq: Two times a day (BID) | ORAL | Status: DC
  Administered 2021-02-14 – 2021-02-16 (×5): 15 mg via ORAL
  Filled 2021-02-14 (×5): qty 1

## 2021-02-14 MED ORDER — ORAL CARE MOUTH RINSE
15.0000 mL | Freq: Two times a day (BID) | OROMUCOSAL | Status: DC
  Administered 2021-02-14 – 2021-02-16 (×4): 15 mL via OROMUCOSAL

## 2021-02-14 NOTE — Progress Notes (Signed)
PROGRESS NOTE    Jasmine [name]  HFW:[id number] DOB: 01/16/1956 DOA: 02/11/2021 PCP: Janora Norlander, DO    Chief Complaint  Patient presents with   Nausea    Brief Narrative:  As per H&P written by Dr. Clearence Ped on 02/12/21 Jasmine [name]  is a 65 y.o. female, with history of anemia, anxiety, chronic back pain, tobacco use disorder, COPD, history of lung cancer status post lobectomy, GERD, pancreatitis, seizures, and more presents ED with a chief complaint of intractable pain and vomiting.  Patient reports that she has had constant vomiting, chest pain, and shoulder blade pain.  The vomiting started first and has been ongoing for 3 days.  She reports she cannot tolerate any p.o. intake-neither solids nor liquids.  Her last normal meal was 4 days ago.  Her last normal bowel movement was also 4 days ago.  She did have a tiny bowel movement 3 days ago, but that was not normal for her.  Patient denies any abdominal pain, but when he asked her to point to her chest pain she points to her epigastric region and lower chest.  She reports that the pain radiates towards her right breast and right upper quadrant.  Her chest pain is worse right after vomiting.  She reports it feels like a punching pain.  It is intermittent, but has been more frequent over the days.  She takes opiates at home for spine, neck, hip pain.  She has been taking these opiates and the pain is not been relieved in her chest.  She thinks that shoulder blade pain is the same as the chest pain, they are not separate.  He does not feel like the pain is radiating through to her back either.  Patient is not sure if she has had her gallbladder out or not.  It looks like she had a CT scan done earlier this year that commented on a dilated common bile duct with a possible stone.  Patient reports that she has had generalized weakness has been progressive over the last 3 days.  She has no history of CHF.  Patient is on 3 L nasal cannula at  home secondary to COPD and lobectomy.  Patient currently still smokes 5 cigarettes a day.  She does not drink alcohol, has not use marijuana, and is vaccinated for COVID.  Patient is full code.  She reports she has a living will that makes her PCP her decision maker.   In the ED Temp 98.6, heart rate 98-1 22, respiratory rate 10-27, blood pressure as high as 180/100 No leukocytosis at 7.4, hemoglobin 12.7, platelets 362 Chemistry panel reveals a hypokalemia 3.0 CT PE shows no PE, but retained secretions in the right main bronchus. EKG shows sinus tachycardia with a rate of 118, QTc 452, no acute ischemic changes Patient was given Benadryl, Dilaudid x2, Reglan, 1 L normal saline bolus without improvement in her pain or symptoms Admission requested for intractable nausea and vomiting as well as pain.  Assessment & Plan: 1-Intractable abd pain and nausea -continue PRN antiemetics and analgesics -symptoms suggestive of acute on chronic pancreatitis -Continue the use of pancreatic enzymes. -Continue to advance diet as tolerated. -wean off IV pain meds as tolerated; adding oral long-acting OxyContin regimen. -continue IVF's (rate has been adjusted) and supportive care. -Continue the use of Reglan and Protonix  2-HTN (hypertension) -continue PRN antihypertensive agents -resume home meds when fully able to tolerate PO's  3-COPD GOLD II/chronic resp failure with hypoxia -continue bronchodilator  management  -continue chronic O2 supplementation -no wheezing on exam.  4-Tobacco abuse -cessation counseling provided  5-depression -continue cymbalta -No suicidal ideation or hallucinations.  6-Hypokalemia -in the setting of GI loses and decrease oral intake -will continue repletion and follow trend  -Potassium 3.1; will give oral potassium x1.  7-chronic pain -will continue analgesics as mentioned above -Will also resume home long-acting OxyContin regimen. -Follow clinical  response.   DVT prophylaxis: SCD's Code Status: Full Code.  Family Communication: no family at bedside.  Disposition:   Status is: Inpatient    Consultants:  None   Procedures:  See below for x-ray reports.   Antimicrobials:  None    Subjective: Reports feeling slightly better today; no fever, no further vomiting.  Still having intermittent nausea and ongoing pain, expressing better control with ongoing analgesics.  Objective: Vitals:   02/13/21 2044 02/14/21 0357 02/14/21 0718 02/14/21 1118  BP: (!) 163/94 (!) 177/104  (!) 161/105  Pulse: 89 (!) 101    Resp: 14 18    Temp: 98.3 F (36.8 C) 97.8 F (36.6 C)    TempSrc: Oral     SpO2: 100% 100% 100%   Weight:      Height:        Intake/Output Summary (Last 24 hours) at 02/14/2021 1221 Last data filed at 02/14/2021 0958 Gross per 24 hour  Intake 3185.43 ml  Output 5050 ml  Net -1864.57 ml   Filed Weights   02/12/21 1112 02/13/21 0500  Weight: 42.5 kg 42.9 kg    Examination: General exam: Alert, awake, oriented x 3, patient reports feeling a slightly better; still experiencing intermittent nausea but no further vomiting.  So far tolerating liquid diet and expressing still ongoing pain. Respiratory system: Clear to auscultation. Respiratory effort normal.  Good saturation on chronic supplementation.  No using accessory muscle. Cardiovascular system: Sinus tachycardia, no rubs, no gallops, no JVD.   Gastrointestinal system: Abdomen is nondistended, soft and tender to palpation midepigastric area and across upper quadrants.  No organomegaly or masses felt. Normal bowel sounds heard. Central nervous system: Alert and oriented. No focal neurological deficits. Extremities: No cyanosis or clubbing; no edema. Skin: No petechiae. Psychiatry: Judgement and insight appear normal. Mood & affect appropriate.   Data Reviewed: I have personally reviewed following labs and imaging studies  CBC: Recent Labs  Lab  02/12/21 0108 02/13/21 0416  WBC 7.4 4.6  NEUTROABS 5.0 2.2  HGB 12.7 11.4*  HCT 41.9 37.7  MCV 103.5* 105.0*  PLT 362 983    Basic Metabolic Panel: Recent Labs  Lab 02/12/21 0108 02/13/21 0416 02/14/21 0430  NA 134* 138 136  K 3.0* 3.0* 3.1*  CL 94* 100 102  CO2 25 30 30   GLUCOSE 98 118* 129*  BUN 11 7* 6*  CREATININE 0.57 0.56 0.46  CALCIUM 9.4 9.0 8.9  MG  --  1.7 1.7    GFR: Estimated Creatinine Clearance: 46.2 mL/min (by C-G formula based on SCr of 0.46 mg/dL).  Liver Function Tests: Recent Labs  Lab 02/12/21 0108 02/13/21 0416 02/14/21 0430  AST 24 18 16   ALT 18 13 13   ALKPHOS 85 64 65  BILITOT 1.2 1.0 0.3  PROT 7.4 5.9* 6.2*  ALBUMIN 3.7 2.9* 3.0*    CBG: No results for input(s): GLUCAP in the last 168 hours.   Recent Results (from the past 240 hour(s))  Resp Panel by RT-PCR (Flu A&B, Covid) Nasopharyngeal Swab     Status: None   Collection  Time: 02/12/21  5:43 AM   Specimen: Nasopharyngeal Swab; Nasopharyngeal(NP) swabs in vial transport medium  Result Value Ref Range Status   SARS Coronavirus 2 by RT PCR NEGATIVE NEGATIVE Final    Comment: (NOTE) SARS-CoV-2 target nucleic acids are NOT DETECTED.  The SARS-CoV-2 RNA is generally detectable in upper respiratory specimens during the acute phase of infection. The lowest concentration of SARS-CoV-2 viral copies this assay can detect is 138 copies/mL. A negative result does not preclude SARS-Cov-2 infection and should not be used as the sole basis for treatment or other patient management decisions. A negative result may occur with  improper specimen collection/handling, submission of specimen other than nasopharyngeal swab, presence of viral mutation(s) within the areas targeted by this assay, and inadequate number of viral copies(<138 copies/mL). A negative result must be combined with clinical observations, patient history, and epidemiological information. The expected result is  Negative.  Fact Sheet for Patients:  EntrepreneurPulse.com.au  Fact Sheet for Healthcare Providers:  IncredibleEmployment.be  This test is no t yet approved or cleared by the [place] FDA and  has been authorized for detection and/or diagnosis of SARS-CoV-2 by FDA under an Emergency Use Authorization (EUA). This EUA will remain  in effect (meaning this test can be used) for the duration of the COVID-19 declaration under Section 564(b)(1) of the Act, 21 U.S.C.section 360bbb-3(b)(1), unless the authorization is terminated  or revoked sooner.       Influenza A by PCR NEGATIVE NEGATIVE Final   Influenza B by PCR NEGATIVE NEGATIVE Final    Comment: (NOTE) The Xpert Xpress SARS-CoV-2/FLU/RSV plus assay is intended as an aid in the diagnosis of influenza from Nasopharyngeal swab specimens and should not be used as a sole basis for treatment. Nasal washings and aspirates are unacceptable for Xpert Xpress SARS-CoV-2/FLU/RSV testing.  Fact Sheet for Patients: EntrepreneurPulse.com.au  Fact Sheet for Healthcare Providers: IncredibleEmployment.be  This test is not yet approved or cleared by the [place] FDA and has been authorized for detection and/or diagnosis of SARS-CoV-2 by FDA under an Emergency Use Authorization (EUA). This EUA will remain in effect (meaning this test can be used) for the duration of the COVID-19 declaration under Section 564(b)(1) of the Act, 21 U.S.C. section 360bbb-3(b)(1), unless the authorization is terminated or revoked.  Performed at [facility], [address], [place], [place] [address]      Radiology Studies: No results found.   Scheduled Meds:  DULoxetine  90 mg Oral Daily   heparin  5,000 Units Subcutaneous Q8H   lipase/protease/amylase  36,000 Units Oral TID AC   metoCLOPramide (REGLAN) injection  5 mg Intravenous Q8H   mirabegron ER  25 mg Oral Daily    mometasone-formoterol  2 puff Inhalation BID   nicotine  14 mg Transdermal Daily   oxyCODONE  15 mg Oral Q12H   pantoprazole (PROTONIX) IV  40 mg Intravenous Q12H   traZODone  150 mg Oral QHS   Continuous Infusions:  dextrose 5 % and 0.9 % NaCl with KCl 40 mEq/L 100 mL/hr at 02/14/21 1117     LOS: 2 days    Time spent: 30 minutes    Barton Dubois, MD [facility]   To contact the attending provider between 7A-7P or the covering provider during after hours 7P-7A, please log into the web site [email or web address] and access using universal [place] password for that web site. If you do not have the password, please call the hospital operator.  02/14/2021, 12:21 PM

## 2021-02-14 NOTE — TOC Initial Note (Signed)
Transition of Care [facility]) - Initial/Assessment Note    Patient Details  Name: Jasmine [name] MRN: [id number] Date of Birth: 11-Jun-1955  Transition of Care [facility]) CM/SW Contact:    Shade Flood, LCSW Phone Number: 02/14/2021, 10:45 AM  Clinical Narrative:                  Pt admitted from home. She is screened as high risk for readmission. Met with pt today to assess and review dc planning. Pt states that she lives with her husband and plans to return home at dc. Per pt, her husband assists her with all ADLs. Pt has a walker, BSC, and shower chair for DME. Pt's husband drives her to appointments and he picks up her medications as needed.  Pt recently had HH but is not currently active. TOC offered resumption of HH upon dc but pt is declining. Pt aware that if she changes her mind, TOC will refer.  MD anticipating possible dc home tomorrow. TOC will follow and assist if needs arise.  Expected Discharge Plan: Home/Self Care Barriers to Discharge: Continued Medical Work up   Patient Goals and CMS Choice Patient states their goals for this hospitalization and ongoing recovery are:: go home      Expected Discharge Plan and Services Expected Discharge Plan: Home/Self Care In-house Referral: Clinical Social Work   Post Acute Care Choice: Durable Medical Equipment Living arrangements for the past 2 months: Single Family Home                                      Prior Living Arrangements/Services Living arrangements for the past 2 months: Single Family Home Lives with:: Spouse Patient language and need for interpreter reviewed:: Yes Do you feel safe going back to the place where you live?: Yes      Need for Family Participation in Patient Care: Yes (Comment) Care giver support system in place?: Yes (comment) Current home services: DME Criminal Activity/Legal Involvement Pertinent to Current Situation/Hospitalization: No - Comment as needed  Activities of Daily  Living Home Assistive Devices/Equipment: Walker (specify type), Raised toilet seat with rails, Bedside commode/3-in-1, Shower chair with back, Grab bars in shower, Wheelchair, [employer] (specify quad or straight), Eyeglasses, Oxygen ADL Screening (condition at time of admission) Patient's cognitive ability adequate to safely complete daily activities?: Yes Is the patient deaf or have difficulty hearing?: No Does the patient have difficulty seeing, even when wearing glasses/contacts?: No Does the patient have difficulty concentrating, remembering, or making decisions?: No Patient able to express need for assistance with ADLs?: Yes Does the patient have difficulty dressing or bathing?: Yes Independently performs ADLs?: No Communication: Independent Dressing (OT): Independent Grooming: Independent Feeding: Independent Bathing: Needs assistance Is this a change from baseline?: Pre-admission baseline Toileting: Needs assistance Is this a change from baseline?: Pre-admission baseline In/Out Bed: Needs assistance Is this a change from baseline?: Pre-admission baseline Walks in Home: Needs assistance Is this a change from baseline?: Pre-admission baseline Does the patient have difficulty walking or climbing stairs?: Yes Weakness of Legs: Both Weakness of Arms/Hands: Both  Permission Sought/Granted                  Emotional Assessment Appearance:: Appears stated age Attitude/Demeanor/Rapport: Engaged Affect (typically observed): Pleasant Orientation: : Oriented to Self, Oriented to Place, Oriented to  Time, Oriented to Situation Alcohol / Substance Use: Not Applicable Psych Involvement: No (comment)  Admission diagnosis:  Intractable pain [R52] Chest pain, unspecified type [R07.9] Nausea and vomiting, unspecified vomiting type [R11.2] Patient Active Problem List   Diagnosis Date Noted   Intractable pain 02/12/2021   Squamous cell lung cancer, right ([place]) 01/13/2021   Atypical  chest pain 11/26/2020   Elevated MCV 11/26/2020   Nausea & vomiting 11/26/2020   History of Clostridioides difficile infection 11/26/2020   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[place]) 10/11/2020   Gastroenteritis 10/11/2020   Acute gastroenteritis 10/10/2020   Protein-calorie malnutrition, severe 08/26/2020   Chest pain 08/25/2020   Pleuritic chest pain 08/25/2020   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   GERD (gastroesophageal reflux disease) 04/22/2020   Abnormal CT of the abdomen 04/22/2020   Preventative health care 04/22/2020   Solitary pulmonary nodule 03/22/2020   Pulmonary infiltrate on chest x-ray 02/24/2020   Nocturnal cough 02/19/2020   Pancreatitis, acute 01/06/2019   Acute pancreatitis 01/05/2019   Cigarette smoker 08/25/2017   Chronic respiratory failure with hypoxia ([place]) 08/25/2017   Centrilobular emphysema ([place]) 07/09/2017   Aortic atherosclerosis ([place]) 07/09/2017   Hepatic steatosis 07/09/2017   Malnutrition of moderate degree 07/02/2017   Acute respiratory failure with hypoxia ([place]) 06/30/2017   Hypokalemia 06/30/2017   COPD with acute exacerbation ([place]) 06/30/2017   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/2016   Tobacco abuse 08/04/2016   Iron deficiency anemia 07/11/2016   Easy bruising 07/10/2016   Lumbar stenosis 11/11/2015   Leg pain 09/24/2015   Vitamin D deficiency 07/29/2015   Chronic back pain 02/22/2015   HTN (hypertension) 02/22/2015   Chronic narcotic use 02/22/2015   COPD GOLD II  02/22/2015   Headache 02/22/2015   PCP:  Janora Norlander, DO Pharmacy:   [facility] [phone] - [place], [place] - [address] [place] [address] Phone: [phone] Fax: [phone]  [facility] [place], [place] [address] Phone: [phone] Fax: [phone]     Social Determinants of Health (SDOH) Interventions    Readmission Risk  Interventions Readmission Risk Prevention Plan 02/14/2021 10/13/2020 01/07/2019  Transportation Screening Complete Complete Complete  Palliative Care Screening - - Not Applicable  Medication Review (RN Care Manager) Complete Complete Complete  PCP or Specialist appointment within 3-5 days of discharge - Complete -  [place] or Home Care Consult Patient refused Complete -  SW Recovery Care/Counseling Consult Complete Complete -  Palliative Care Screening Not Applicable Not Applicable -  [place] Not Applicable Not Applicable -  Some recent data might be hidden

## 2021-02-15 LAB — BASIC METABOLIC PANEL
Anion gap: 7 (ref 5–15)
BUN: 8 mg/dL (ref 8–23)
CO2: 26 mmol/L (ref 22–32)
Calcium: 9.4 mg/dL (ref 8.9–10.3)
Chloride: 102 mmol/L (ref 98–111)
Creatinine, Ser: 0.51 mg/dL (ref 0.44–1.00)
GFR, Estimated: >60 mL/min (ref >60)
Glucose, Bld: 110 mg/dL — ABNORMAL HIGH (ref 70–99)
Potassium: 4.6 mmol/L (ref 3.5–5.1)
Sodium: 135 mmol/L (ref 135–145)

## 2021-02-15 MED ORDER — ADULT MULTIVITAMIN W/MINERALS CH
1.0000 | ORAL_TABLET | Freq: Every day | ORAL | Status: DC
  Administered 2021-02-15 – 2021-02-16 (×2): 1 via ORAL
  Filled 2021-02-15 (×2): qty 1

## 2021-02-15 MED ORDER — ENSURE ENLIVE PO LIQD
237.0000 mL | Freq: Two times a day (BID) | ORAL | Status: DC
  Administered 2021-02-15 – 2021-02-16 (×3): 237 mL via ORAL

## 2021-02-15 NOTE — Progress Notes (Signed)
Initial Nutrition Assessment  DOCUMENTATION CODES:   Severe malnutrition in context of chronic illness  INTERVENTION:  Ensure Enlive po BID, each supplement provides 350 kcal and 20 grams of protein   Multivitamin   NUTRITION DIAGNOSIS:   Severe Malnutrition related to vomiting, nausea, chronic illness (acute N/V and chronic COPD-O2 dependent) as evidenced by estimated needs, energy intake < or equal to 50% for > or equal to 5 days, energy intake < or equal to 75% for > or equal to 1 month, percent weight loss, moderate fat depletion, severe fat depletion, moderate muscle depletion, severe muscle depletion, BMI-16.95.    GOAL:  Patient will meet greater than or equal to 90% of their needs  MONITOR:  PO intake, Supplement acceptance, Labs, Weight trends  REASON FOR ASSESSMENT:   Malnutrition Screening Tool    ASSESSMENT: Patient is an underweight 65 yo female with history of malnutrition, COPD, lung cancer, Chronic O2@ 3.5 liters. Complaining of nausea and vomiting on admission. Limited po intake 4 days PTA. Hypokalemia.   Patient tolerating soft diet today with improved intake for dinner last evening and today 50-100% of meals. Last BM 12/6??? Encouraged pt to consume high protein /high calorie ONS due to malnourished state.  Patient has hx of malnutrition which is ongoing. Suboptimal intake nausea and vomiting and chronic increased energy and protein needs with lung disease. Minimal reserves BMI 16.95.   Patient weight reflects acute loss the past month 6% which is significant.   Medications reviewed and include: reglan, protonix  Labs:  BMP Latest Ref Rng & Units 02/15/2021 02/14/2021 02/13/2021  Glucose 70 - 99 mg/dL 110(H) 129(H) 118(H)  BUN 8 - 23 mg/dL 8 6(L) 7(L)  Creatinine 0.44 - 1.00 mg/dL 0.51 0.46 0.56  BUN/Creat Ratio 12 - 28 - - -  Sodium 135 - 145 mmol/L 135 136 138  Potassium 3.5 - 5.1 mmol/L 4.6 3.1(L) 3.0(L)  Chloride 98 - 111 mmol/L 102 102 100  CO2 22  - 32 mmol/L 26 30 30   Calcium 8.9 - 10.3 mg/dL 9.4 8.9 9.0     NUTRITION - FOCUSED PHYSICAL EXAM:  Flowsheet Row Most Recent Value  Orbital Region Moderate depletion  Upper Arm Region Severe depletion  Thoracic and Lumbar Region Severe depletion  Buccal Region Moderate depletion  Temple Region Mild depletion  Clavicle Bone Region Severe depletion  Clavicle and Acromion Bone Region Severe depletion  Scapular Bone Region Moderate depletion  Patellar Region Severe depletion  Edema (RD Assessment) None  Hair Reviewed  Eyes Reviewed  Mouth Reviewed  Skin Reviewed  Nails Reviewed       Diet Order:   Diet Order             DIET SOFT Room service appropriate? Yes; Fluid consistency: Thin  Diet effective now                   EDUCATION NEEDS:  Education needs have been addressed  Skin:  Skin Assessment: Reviewed RN Assessment  Last BM:  12/6  Height:   Ht Readings from Last 1 Encounters:  02/12/21 5\' 3"  (1.6 m)    Weight:   Wt Readings from Last 1 Encounters:  02/15/21 43.4 kg    Ideal Body Weight:   52 kg  BMI:  Body mass index is 16.95 kg/m.  Estimated Nutritional Needs:   Kcal:  1500-1650  Protein:  65-70 gr  Fluid:  >1200 ml daily  Colman Cater MS,RD,CSG,LDN Contact: Shea Evans

## 2021-02-15 NOTE — Care Management Important Message (Signed)
Important Message  Patient Details  Name: Jasmine [name] MRN: [id number] Date of Birth: 1955-10-26   Medicare Important Message Given:  N/A - LOS <3 / Initial given by admissions     [name] 02/15/2021, 10:35 AM

## 2021-02-15 NOTE — Discharge Instructions (Signed)
[place] [facility] Stay Proper nutrition can help your body recover from illness and injury.   Foods and beverages high in protein, vitamins, and minerals help rebuild muscle loss, promote healing, & reduce fall risk.   In addition to eating healthy foods, a nutrition shake is an easy, delicious way to get the nutrition you need during and after your hospital stay  It is recommended that you continue to drink 2 bottles per day of: High Protein/High Calorie oral nutrition for at least 1 month (30 days) after your hospital stay   Tips for adding a nutrition shake into your routine: As allowed, drink one with vitamins or medications instead of water or juice Enjoy one as a tasty mid-morning or afternoon snack Drink cold or make a milkshake out of it Drink one instead of milk with cereal or snacks Use as a coffee creamer   Available at the following grocery stores and pharmacies:           * [facility] [phone]            For COUPONS visit: [email or web address]/join or [email or web address]   Suggested Substitutions Ensure Plus = Boost Plus = Carnation Breakfast Essentials = Boost Compact Ensure Active Clear = Boost Breeze Glucerna Shake = Boost Glucose Control = Carnation Breakfast Essentials SUGAR FREE

## 2021-02-15 NOTE — Progress Notes (Signed)
PROGRESS NOTE    Jasmine [name]  WER:[id number] DOB: 05-21-1955 DOA: 02/11/2021 PCP: Janora Norlander, DO    Chief Complaint  Patient presents with   Nausea    Brief Narrative:  As per H&P written by Dr. Clearence Ped on 02/12/21 Jasmine [name]  is a 65 y.o. female, with history of anemia, anxiety, chronic back pain, tobacco use disorder, COPD, history of lung cancer status post lobectomy, GERD, pancreatitis, seizures, and more presents ED with a chief complaint of intractable pain and vomiting.  Patient reports that she has had constant vomiting, chest pain, and shoulder blade pain.  The vomiting started first and has been ongoing for 3 days.  She reports she cannot tolerate any p.o. intake-neither solids nor liquids.  Her last normal meal was 4 days ago.  Her last normal bowel movement was also 4 days ago.  She did have a tiny bowel movement 3 days ago, but that was not normal for her.  Patient denies any abdominal pain, but when he asked her to point to her chest pain she points to her epigastric region and lower chest.  She reports that the pain radiates towards her right breast and right upper quadrant.  Her chest pain is worse right after vomiting.  She reports it feels like a punching pain.  It is intermittent, but has been more frequent over the days.  She takes opiates at home for spine, neck, hip pain.  She has been taking these opiates and the pain is not been relieved in her chest.  She thinks that shoulder blade pain is the same as the chest pain, they are not separate.  He does not feel like the pain is radiating through to her back either.  Patient is not sure if she has had her gallbladder out or not.  It looks like she had a CT scan done earlier this year that commented on a dilated common bile duct with a possible stone.  Patient reports that she has had generalized weakness has been progressive over the last 3 days.  She has no history of CHF.  Patient is on 3 L nasal cannula at  home secondary to COPD and lobectomy.  Patient currently still smokes 5 cigarettes a day.  She does not drink alcohol, has not use marijuana, and is vaccinated for COVID.  Patient is full code.  She reports she has a living will that makes her PCP her decision maker.   In the ED Temp 98.6, heart rate 98-1 22, respiratory rate 10-27, blood pressure as high as 180/100 No leukocytosis at 7.4, hemoglobin 12.7, platelets 362 Chemistry panel reveals a hypokalemia 3.0 CT PE shows no PE, but retained secretions in the right main bronchus. EKG shows sinus tachycardia with a rate of 118, QTc 452, no acute ischemic changes Patient was given Benadryl, Dilaudid x2, Reglan, 1 L normal saline bolus without improvement in her pain or symptoms Admission requested for intractable nausea and vomiting as well as pain.  Assessment & Plan: 1-Intractable abd pain and nausea -continue PRN antiemetics and analgesics -symptoms suggestive of acute on chronic pancreatitis -Continue the use of pancreatic enzymes. -Continue to advance diet as tolerated. -wean off IV pain meds as tolerated; adding oral long-acting OxyContin regimen. -continue IVF's (rate has been further adjusted as diet has been advanced)  -Continue supportive care. -Continue the use of Protonix and as needed antiemetics.  2-HTN (hypertension) -continue PRN antihypertensive agents -resume home meds when fully able to tolerate PO's  3-COPD GOLD II/chronic resp failure with hypoxia -continue bronchodilator management  -continue chronic O2 supplementation -no wheezing on exam.  4-Tobacco abuse -cessation counseling provided  5-depression -continue cymbalta -No suicidal ideation or hallucinations.  6-Hypokalemia -in the setting of GI loses and decrease oral intake -will continue repletion as needed. -Potassium 4.6 currently; continue to follow trend intermittently.  7-chronic pain -will continue analgesics as mentioned above -Will also  continue home long-acting OxyContin regimen. -Follow clinical response.   DVT prophylaxis: SCD's Code Status: Full Code.  Family Communication: no family at bedside.  Disposition:   Status is: Inpatient; will continue pain management, further advancing diet and supportive care.  Hopefully discharge on 02/16/2021 if further diet tolerated.    Consultants:  None   Procedures:  See below for x-ray reports.   Antimicrobials:  None    Subjective: Patient reports no further vomiting; tolerating full liquid diet and in no major distress.  She is afebrile, no shortness of breath, no dysuria or hematuria.  Still having pain but overall much better control.  Objective: Vitals:   02/14/21 2103 02/15/21 0509 02/15/21 0734 02/15/21 1202  BP: 123/79 118/78  (!) 141/91  Pulse: (!) 103 98  100  Resp: 18 18  16   Temp: 98 F (36.7 C) 98.4 F (36.9 C)  98.8 F (37.1 C)  TempSrc: Oral Oral  Oral  SpO2: 100% 100% 100% 100%  Weight:  43.4 kg    Height:        Intake/Output Summary (Last 24 hours) at 02/15/2021 1625 Last data filed at 02/15/2021 1203 Gross per 24 hour  Intake 1316 ml  Output 1000 ml  Net 316 ml   Filed Weights   02/12/21 1112 02/13/21 0500 02/15/21 0509  Weight: 42.5 kg 42.9 kg 43.4 kg    Examination: General exam: Alert, awake, oriented x 3; reports no further vomiting.  Still with intermittent nausea and ongoing diffuse pain in her epigastric area upper quadrants and between shoulder blades.  Reports she has tolerated full liquid diet and would like to advance to regular diet. Respiratory system: Respiratory effort normal.  Good saturation on chronic supplementation.  No wheezing or crackles on exam. Cardiovascular system:RRR. No murmurs, rubs, gallops.  No JVD. Gastrointestinal system: Abdomen is nondistended, soft and nontender. No organomegaly or masses felt. Normal bowel sounds heard. Central nervous system: Alert and oriented. No focal neurological  deficits. Extremities: No cyanosis or clubbing Skin: No petechiae. Psychiatry: Judgement and insight appear normal. Mood & affect appropriate.   Data Reviewed: I have personally reviewed following labs and imaging studies  CBC: Recent Labs  Lab 02/12/21 0108 02/13/21 0416  WBC 7.4 4.6  NEUTROABS 5.0 2.2  HGB 12.7 11.4*  HCT 41.9 37.7  MCV 103.5* 105.0*  PLT 362 332    Basic Metabolic Panel: Recent Labs  Lab 02/12/21 0108 02/13/21 0416 02/14/21 0430 02/15/21 0432  NA 134* 138 136 135  K 3.0* 3.0* 3.1* 4.6  CL 94* 100 102 102  CO2 25 30 30 26   GLUCOSE 98 118* 129* 110*  BUN 11 7* 6* 8  CREATININE 0.57 0.56 0.46 0.51  CALCIUM 9.4 9.0 8.9 9.4  MG  --  1.7 1.7  --     GFR: Estimated Creatinine Clearance: 46.8 mL/min (by C-G formula based on SCr of 0.51 mg/dL).  Liver Function Tests: Recent Labs  Lab 02/12/21 0108 02/13/21 0416 02/14/21 0430  AST 24 18 16   ALT 18 13 13   ALKPHOS 85 64 65  BILITOT 1.2 1.0 0.3  PROT 7.4 5.9* 6.2*  ALBUMIN 3.7 2.9* 3.0*    CBG: No results for input(s): GLUCAP in the last 168 hours.   Recent Results (from the past 240 hour(s))  Resp Panel by RT-PCR (Flu A&B, Covid) Nasopharyngeal Swab     Status: None   Collection Time: 02/12/21  5:43 AM   Specimen: Nasopharyngeal Swab; Nasopharyngeal(NP) swabs in vial transport medium  Result Value Ref Range Status   SARS Coronavirus 2 by RT PCR NEGATIVE NEGATIVE Final    Comment: (NOTE) SARS-CoV-2 target nucleic acids are NOT DETECTED.  The SARS-CoV-2 RNA is generally detectable in upper respiratory specimens during the acute phase of infection. The lowest concentration of SARS-CoV-2 viral copies this assay can detect is 138 copies/mL. A negative result does not preclude SARS-Cov-2 infection and should not be used as the sole basis for treatment or other patient management decisions. A negative result may occur with  improper specimen collection/handling, submission of specimen  other than nasopharyngeal swab, presence of viral mutation(s) within the areas targeted by this assay, and inadequate number of viral copies(<138 copies/mL). A negative result must be combined with clinical observations, patient history, and epidemiological information. The expected result is Negative.  Fact Sheet for Patients:  EntrepreneurPulse.com.au  Fact Sheet for Healthcare Providers:  IncredibleEmployment.be  This test is no t yet approved or cleared by the [place] FDA and  has been authorized for detection and/or diagnosis of SARS-CoV-2 by FDA under an Emergency Use Authorization (EUA). This EUA will remain  in effect (meaning this test can be used) for the duration of the COVID-19 declaration under Section 564(b)(1) of the Act, 21 U.S.C.section 360bbb-3(b)(1), unless the authorization is terminated  or revoked sooner.       Influenza A by PCR NEGATIVE NEGATIVE Final   Influenza B by PCR NEGATIVE NEGATIVE Final    Comment: (NOTE) The Xpert Xpress SARS-CoV-2/FLU/RSV plus assay is intended as an aid in the diagnosis of influenza from Nasopharyngeal swab specimens and should not be used as a sole basis for treatment. Nasal washings and aspirates are unacceptable for Xpert Xpress SARS-CoV-2/FLU/RSV testing.  Fact Sheet for Patients: EntrepreneurPulse.com.au  Fact Sheet for Healthcare Providers: IncredibleEmployment.be  This test is not yet approved or cleared by the [place] FDA and has been authorized for detection and/or diagnosis of SARS-CoV-2 by FDA under an Emergency Use Authorization (EUA). This EUA will remain in effect (meaning this test can be used) for the duration of the COVID-19 declaration under Section 564(b)(1) of the Act, 21 U.S.C. section 360bbb-3(b)(1), unless the authorization is terminated or revoked.  Performed at [facility], [address], [place], [place]  [address]      Radiology Studies: No results found.   Scheduled Meds:  chlorhexidine  15 mL Mouth Rinse BID   DULoxetine  90 mg Oral Daily   feeding supplement  237 mL Oral BID BM   heparin  5,000 Units Subcutaneous Q8H   lipase/protease/amylase  36,000 Units Oral TID AC   mouth rinse  15 mL Mouth Rinse q12n4p   mirabegron ER  25 mg Oral Daily   mometasone-formoterol  2 puff Inhalation BID   multivitamin with minerals  1 tablet Oral Daily   nicotine  14 mg Transdermal Daily   oxyCODONE  15 mg Oral Q12H   pantoprazole (PROTONIX) IV  40 mg Intravenous Q12H   traZODone  150 mg Oral QHS   Continuous Infusions:  dextrose 5 % and 0.9 %  NaCl with KCl 40 mEq/L 50 mL/hr at 02/15/21 3838     LOS: 3 days    Time spent: 30 minutes    Barton Dubois, MD [facility]   To contact the attending provider between 7A-7P or the covering provider during after hours 7P-7A, please log into the web site [email or web address] and access using universal [place] password for that web site. If you do not have the password, please call the hospital operator.  02/15/2021, 4:25 PM

## 2021-02-16 DIAGNOSIS — R079 Chest pain, unspecified: Secondary | ICD-10-CM

## 2021-02-16 LAB — BASIC METABOLIC PANEL
Anion gap: 8 (ref 5–15)
BUN: 11 mg/dL (ref 8–23)
CO2: 27 mmol/L (ref 22–32)
Calcium: 9.6 mg/dL (ref 8.9–10.3)
Chloride: 101 mmol/L (ref 98–111)
Creatinine, Ser: 0.44 mg/dL (ref 0.44–1.00)
GFR, Estimated: >60 mL/min (ref >60)
Glucose, Bld: 112 mg/dL — ABNORMAL HIGH (ref 70–99)
Potassium: 4.7 mmol/L (ref 3.5–5.1)
Sodium: 136 mmol/L (ref 135–145)

## 2021-02-16 NOTE — Discharge Summary (Signed)
Physician Discharge Summary  Jasmine [name] NUU:[id number] DOB: 10/01/1955 DOA: 02/11/2021  PCP: Janora Norlander, DO  Admit date: 02/11/2021 Discharge date: 02/16/2021  Admitted From: Home Disposition:  Home / SNF  Recommendations for Outpatient Follow-up:  Follow up with PCP in 1-2 weeks Please obtain BMP/CBC in one week    Equipment/Devices: [facility]  Discharge Condition: Stable CODE STATUS: FULL Diet recommendation: Heart Healthy   Brief/Interim Summary: 65 y.o. female with medical history significant of seasonal allergies, normocytic anemia, history of blood transfusion, anxiety, osteoarthritis, COPD, active smoker, chronic back pain, easy bruising, GERD, headache, nephrolithiasis, hypertension, hyperglycemia, neuropathy, normocytic anemia, history of pancreatitis, right lung cancer s/p lobectomy, sciatica, seizures as a child who is coming to the emergency department with complaints of numerous episodes of nausea/emesis.   The vomiting started first and has been ongoing for 3 days.  She reports she cannot tolerate any p.o. intake-neither solids nor liquids.  Her last normal meal was 4 days ago.  Her last normal bowel movement was also 4 days ago.  She did have a tiny bowel movement 3 days ago, but that was not normal for her.  Patient denies any abdominal pain, but when he asked her to point to her chest pain she points to her epigastric region and lower chest.  She reports that the pain radiates towards her right breast and right upper quadrant.  Her chest pain is worse right after vomiting.  She reports it feels like a punching pain.  It is intermittent, but has been more frequent over the days.  She takes opiates at home for spine, neck, hip pain.  She has been taking these opiates and the pain is not been relieved in her chest.  She thinks that shoulder blade pain is the same as the chest pain, they are not separate.  He does not feel like the pain is radiating through to her back  either.  Patient is not sure if she has had her gallbladder out or not.  It looks like she had a CT scan done earlier this year that commented on a dilated common bile duct with a possible stone.  Patient reports that she has had generalized weakness has been progressive over the last 3 days.  She has no history of CHF.  Patient is on 3 L nasal cannula at home secondary to COPD and lobectomy.  Patient currently still smokes 5 cigarettes a day.  She does not drink alcohol, has not use marijuana, and is vaccinated for COVID.  Patient is full code.  She reports she has a living will that makes her PCP her decision maker.   In the ED Temp 98.6, heart rate 98-1 22, respiratory rate 10-27, blood pressure as high as 180/100 No leukocytosis at 7.4, hemoglobin 12.7, platelets 362 Chemistry panel reveals a hypokalemia 3.0 CT PE shows no PE, but retained secretions in the right main bronchus. EKG shows sinus tachycardia with a rate of 118, QTc 452, no acute ischemic changes Patient was given Benadryl, Dilaudid x2, Reglan, 1 L normal saline bolus without improvement in her pain or symptoms Admission requested for intractable nausea and vomiting as well as pain.    Discharge Diagnoses:  1-Intractable abd pain and nausea -continue PRN antiemetics and analgesics -symptoms suggestive of acute on chronic pancreatitis -Continue the use of pancreatic enzymes. -Continue to advance diet as tolerated. -wean off IV pain meds as tolerated; adding oral long-acting OxyContin home regimen. -continue IVF's (rate has been further adjusted as  diet has been advanced)  -Continue supportive care. -Continue the use of Protonix and as needed antiemetics. -pt tolerating diet without emesis or worsen abd pain on day of d/c -d/c home to restart home dose percocet 10 and OxyContin 15 bid   2-HTN (hypertension) -continue PRN antihypertensive agents -resume home meds when fully able to tolerate PO's   3-COPD GOLD II/chronic  resp failure with hypoxia -continue bronchodilator management  -continue chronic O2 supplementation -no wheezing on exam. -stable on her home 3L   4-Tobacco abuse -cessation counseling provided   5-depression -continue cymbalta -No suicidal ideation or hallucinations.   6-Hypokalemia -in the setting of GI loses and decrease oral intake -will continue repletion as needed. -Potassium 4.7 currently; continue to follow trend intermittently.   7-chronic pain -will continue analgesics as mentioned above -Will also continue home long-acting OxyContin regimen. -Follow clinical response.   Discharge Instructions   Allergies as of 02/16/2021       Reactions   Lyrica [pregabalin] Other (See Comments)   EXTREME SHAKING/TREMBLING   Darvon [propoxyphene]    UNSPECIFIED REACTION    Gabapentin    Extreme shaking and trembling    Augmentin [amoxicillin-pot Clavulanate] Nausea And Vomiting   Has patient had a PCN reaction causing immediate rash, facial/tongue/throat swelling, SOB or lightheadedness with hypotension:no Has patient had a PCN reaction causing severe rash involving mucus membranes or skin necrosis:No Has patient had a PCN reaction that required hospitalization:No Has patient had a PCN reaction occurring within the last 10 years:Yes--ONLY N/V If all of the above answers are "NO", then may proceed with Cephalosporin use.   Erythromycin Itching, Rash      Vibramycin [doxycycline Calcium] Itching, Rash   "[place] "        Medication List     STOP taking these medications    diclofenac sodium 1 % Gel Commonly known as: VOLTAREN       TAKE these medications    albuterol 108 (90 Base) MCG/ACT inhaler Commonly known as: VENTOLIN HFA Inhale into the lungs every 6 (six) hours as needed for wheezing or shortness of breath.   amLODipine 10 MG tablet Commonly known as: NORVASC Take 1 tablet (10 mg total) by mouth daily.   atorvastatin 40 MG tablet Commonly  known as: Lipitor Take 1 tablet (40 mg total) by mouth daily.   budesonide-formoterol 160-4.5 MCG/ACT inhaler Commonly known as: SYMBICORT USE 2 INHALATIONS TWICE A DAY   Cholecalciferol 125 MCG (5000 UT) capsule Take 5,000 Units by mouth daily.   dextromethorphan-guaiFENesin 30-600 MG 12hr tablet Commonly known as: MUCINEX DM Take 1 tablet by mouth 2 (two) times daily.   DULoxetine 30 MG capsule Commonly known as: CYMBALTA Take 3 capsules (90 mg total) by mouth daily.   Ensure High Protein Liqd Drink 1 can 3 times daily.   ipratropium-albuterol 0.5-2.5 (3) MG/3ML Soln Commonly known as: DUONEB Inhale 3 mLs into the lungs 4 (four) times daily as needed (for shortness of breath/wheezing). *May use as needed   magnesium oxide 400 (241.3 Mg) MG tablet Commonly known as: MAG-OX Take 400 mg by mouth 2 (two) times daily.   Metanx 3-90.314-2-35 MG Caps TAKE 1 TABLET BY MOUTH TWO TIMES A DAY What changed: when to take this   mirabegron ER 25 MG Tb24 tablet Commonly known as: Myrbetriq Take 1 tablet (25 mg total) by mouth daily.   nicotine 14 mg/24hr patch Commonly known as: Nicoderm CQ Place 1 patch (14 mg total) onto the skin daily.  omeprazole 40 MG capsule Commonly known as: PRILOSEC Take one capsule once to twice daily before a meal.   ondansetron 4 MG disintegrating tablet Commonly known as: Zofran ODT Take 1 tablet (4 mg total) by mouth every 8 (eight) hours as needed for nausea or vomiting.   oxyCODONE-acetaminophen 10-325 MG tablet Commonly known as: PERCOCET Take 1 tablet by mouth 5 (five) times daily.   OxyCONTIN 15 mg 12 hr tablet Generic drug: oxyCODONE Take 15 mg by mouth every 12 (twelve) hours.   OXYGEN Inhale 3 L into the lungs See admin instructions. continuous   Poly-Iron 150 Forte 150-0.025-1 MG Caps Generic drug: Iron Polysacch Cmplx-B12-FA Take 1 capsule by mouth 2 (two) times daily.   Spiriva Respimat 2.5 MCG/ACT Aers Generic drug:  Tiotropium Bromide Monohydrate USE 2 INHALATIONS DAILY What changed: See the new instructions.   tizanidine 6 MG capsule Commonly known as: ZANAFLEX Take 6 mg by mouth 3 (three) times daily.   traZODone 150 MG tablet Commonly known as: DESYREL TAKE 1 TO 2 TABLETS AT BEDTIME What changed: See the new instructions.        Allergies  Allergen Reactions   Lyrica [Pregabalin] Other (See Comments)    EXTREME SHAKING/TREMBLING   Darvon [Propoxyphene]     UNSPECIFIED REACTION    Gabapentin     Extreme shaking and trembling    Augmentin [Amoxicillin-Pot Clavulanate] Nausea And Vomiting    Has patient had a PCN reaction causing immediate rash, facial/tongue/throat swelling, SOB or lightheadedness with hypotension:no Has patient had a PCN reaction causing severe rash involving mucus membranes or skin necrosis:No Has patient had a PCN reaction that required hospitalization:No Has patient had a PCN reaction occurring within the last 10 years:Yes--ONLY N/V If all of the above answers are "NO", then may proceed with Cephalosporin use.    Erythromycin Itching and Rash        Vibramycin [Doxycycline Calcium] Itching and Rash    "[place] "    Consultations: none   Procedures/Studies: CT Angio Chest Pulmonary Embolism (PE) W or WO Contrast  Result Date: 02/12/2021 CLINICAL DATA:  65 year old female with nausea, chest pain, pain between the shoulder blades. 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 EXAM: CT ANGIOGRAPHY CHEST WITH CONTRAST TECHNIQUE: Multidetector CT imaging of the chest was performed using the standard protocol during bolus administration of intravenous contrast. Multiplanar CT image reconstructions and MIPs were obtained to evaluate the vascular anatomy. CONTRAST:  176mL OMNIPAQUE IOHEXOL 350 MG/ML SOLN COMPARISON:  Portable chest 0122 hours. Chest CTA 08/25/2020. CT Chest, Abdomen, and Pelvis 11/26/2020. FINDINGS: Cardiovascular: Good  contrast bolus timing in the pulmonary arterial tree. No focal filling defect identified in the pulmonary arteries to suggest acute pulmonary embolism. Calcified coronary artery atherosclerosis. Calcified aortic atherosclerosis. No cardiomegaly or pericardial effusion. Mediastinum/Nodes: No mediastinal mass or lymphadenopathy. Postoperative changes at the right hilum. Lungs/Pleura: Postoperative changes of right upper lobectomy. Underlying emphysema. Architectural distortion about the right hilum. Small volume retained secretions in the right mainstem bronchus and bronchus intermedius. Stable lung volumes and ventilation compared to September. Occasional mild right lower lobe subpleural scarring or atelectasis. No pleural effusion. Upper Abdomen: Negative visible spleen, adrenal glands, left kidney, and bowel in the upper abdomen. Chronic intra and extrahepatic biliary ductal dilatation partially visible and stable. Stable partially visible gallbladder. Chronic pancreatic atrophy with prominence of the main pancreatic duct partially visible and stable. Musculoskeletal: Osteopenia. Previous cervical ACDF. Chronic midthoracic spinal stimulator. No acute or suspicious osseous lesion  identified. Review of the MIP images confirms the above findings. IMPRESSION: 1. No evidence of acute pulmonary embolus. 2. Small volume retained secretions in the right mainstem bronchus. Otherwise stable right upper lobectomy, Emphysema (ICD10-J43.9). 3. Calcified coronary artery and Aortic Atherosclerosis (ICD10-I70.0). Electronically Signed   By: Genevie Ann M.D.   On: 02/12/2021 04:46   DG Chest Port 1 View  Result Date: 02/12/2021 CLINICAL DATA:  Chest pain EXAM: PORTABLE CHEST 1 VIEW COMPARISON:  01/04/2021 FINDINGS: Postsurgical changes in the right hemithorax. Nodular opacity in the right perihilar region likely reflect scarring when correlating with prior CT. Left lung is essentially clear. Mild blunting of the right costophrenic  angle, likely postsurgical. No definite pleural effusion or pneumothorax. The heart is normal in size.  Thoracic aortic atherosclerosis. Thoracic spine stimulator. IMPRESSION: Postsurgical changes in the right hemithorax. No evidence of acute cardiopulmonary disease. Electronically Signed   By: Julian Hy M.D.   On: 02/12/2021 01:44   US Abdomen Limited RUQ (LIVER/GB)  Result Date: 02/12/2021 CLINICAL DATA:  65 year old female with nausea, pain. EXAM: ULTRASOUND ABDOMEN LIMITED RIGHT UPPER QUADRANT COMPARISON:  CTA chest 0415 hours today. CT Chest, Abdomen, and Pelvis today are reported separately. 11/26/2020. Ultrasound 01/06/2019. FINDINGS: Gallbladder: Evidence of some sludge. No shadowing echogenic stones. Gallbladder size relatively normal. Gallbladder wall thickness at the upper limits of normal 2-3 mm. No pericholecystic fluid. Common bile duct: Diameter: 10 mm, chronically dilated. In 2020 the CBD measured up to 17 mm. And roughly 16 mm on the September CT Abdomen and Pelvis. No intraductal filling defect identified. Liver: Echogenic liver (image 33). No discrete liver lesion. Little to no intrahepatic biliary ductal dilatation as demonstrated by CT. Portal vein is patent on color Doppler imaging with normal direction of blood flow towards the liver. Other: Negative visible right kidney. IMPRESSION: 1. Chronically dilated CBD. No intraductal filling defect identified. 2. Gallbladder sludge, but no cholelithiasis or strong evidence of acute cholecystitis. 3. Fatty liver. Electronically Signed   By: Genevie Ann M.D.   On: 02/12/2021 09:40        Discharge Exam: Vitals:   02/16/21 0712 02/16/21 1237  BP:  (!) 152/85  Pulse:  (!) 106  Resp:  16  Temp:  98.6 F (37 C)  SpO2: 100% 100%   Vitals:   02/15/21 2107 02/16/21 0518 02/16/21 0712 02/16/21 1237  BP: (!) 165/92 (!) 144/89  (!) 152/85  Pulse: (!) 101 94  (!) 106  Resp: 20 20  16   Temp: 98 F (36.7 C) 98.5 F (36.9 C)  98.6 F  (37 C)  TempSrc:    Oral  SpO2: 100% 100% 100% 100%  Weight:      Height:        General: Pt is alert, awake, not in acute distress Cardiovascular: RRR, S1/S2 +, no rubs, no gallops Respiratory: diminished BS.  No wheeze Abdominal: Soft, NT, ND, bowel sounds + Extremities: no edema, no cyanosis   The results of significant diagnostics from this hospitalization (including imaging, microbiology, ancillary and laboratory) are listed below for reference.    Significant Diagnostic Studies: CT Angio Chest Pulmonary Embolism (PE) W or WO Contrast  Result Date: 02/12/2021 CLINICAL DATA:  65 year old female with nausea, chest pain, pain between the shoulder blades. 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 EXAM: CT ANGIOGRAPHY CHEST WITH CONTRAST TECHNIQUE: Multidetector CT imaging of the chest was performed using the standard protocol during bolus administration of intravenous contrast. Multiplanar CT image  reconstructions and MIPs were obtained to evaluate the vascular anatomy. CONTRAST:  173mL OMNIPAQUE IOHEXOL 350 MG/ML SOLN COMPARISON:  Portable chest 0122 hours. Chest CTA 08/25/2020. CT Chest, Abdomen, and Pelvis 11/26/2020. FINDINGS: Cardiovascular: Good contrast bolus timing in the pulmonary arterial tree. No focal filling defect identified in the pulmonary arteries to suggest acute pulmonary embolism. Calcified coronary artery atherosclerosis. Calcified aortic atherosclerosis. No cardiomegaly or pericardial effusion. Mediastinum/Nodes: No mediastinal mass or lymphadenopathy. Postoperative changes at the right hilum. Lungs/Pleura: Postoperative changes of right upper lobectomy. Underlying emphysema. Architectural distortion about the right hilum. Small volume retained secretions in the right mainstem bronchus and bronchus intermedius. Stable lung volumes and ventilation compared to September. Occasional mild right lower lobe subpleural scarring or atelectasis. No  pleural effusion. Upper Abdomen: Negative visible spleen, adrenal glands, left kidney, and bowel in the upper abdomen. Chronic intra and extrahepatic biliary ductal dilatation partially visible and stable. Stable partially visible gallbladder. Chronic pancreatic atrophy with prominence of the main pancreatic duct partially visible and stable. Musculoskeletal: Osteopenia. Previous cervical ACDF. Chronic midthoracic spinal stimulator. No acute or suspicious osseous lesion identified. Review of the MIP images confirms the above findings. IMPRESSION: 1. No evidence of acute pulmonary embolus. 2. Small volume retained secretions in the right mainstem bronchus. Otherwise stable right upper lobectomy, Emphysema (ICD10-J43.9). 3. Calcified coronary artery and Aortic Atherosclerosis (ICD10-I70.0). Electronically Signed   By: Genevie Ann M.D.   On: 02/12/2021 04:46   DG Chest Port 1 View  Result Date: 02/12/2021 CLINICAL DATA:  Chest pain EXAM: PORTABLE CHEST 1 VIEW COMPARISON:  01/04/2021 FINDINGS: Postsurgical changes in the right hemithorax. Nodular opacity in the right perihilar region likely reflect scarring when correlating with prior CT. Left lung is essentially clear. Mild blunting of the right costophrenic angle, likely postsurgical. No definite pleural effusion or pneumothorax. The heart is normal in size.  Thoracic aortic atherosclerosis. Thoracic spine stimulator. IMPRESSION: Postsurgical changes in the right hemithorax. No evidence of acute cardiopulmonary disease. Electronically Signed   By: Julian Hy M.D.   On: 02/12/2021 01:44   US Abdomen Limited RUQ (LIVER/GB)  Result Date: 02/12/2021 CLINICAL DATA:  65 year old female with nausea, pain. EXAM: ULTRASOUND ABDOMEN LIMITED RIGHT UPPER QUADRANT COMPARISON:  CTA chest 0415 hours today. CT Chest, Abdomen, and Pelvis today are reported separately. 11/26/2020. Ultrasound 01/06/2019. FINDINGS: Gallbladder: Evidence of some sludge. No shadowing echogenic  stones. Gallbladder size relatively normal. Gallbladder wall thickness at the upper limits of normal 2-3 mm. No pericholecystic fluid. Common bile duct: Diameter: 10 mm, chronically dilated. In 2020 the CBD measured up to 17 mm. And roughly 16 mm on the September CT Abdomen and Pelvis. No intraductal filling defect identified. Liver: Echogenic liver (image 33). No discrete liver lesion. Little to no intrahepatic biliary ductal dilatation as demonstrated by CT. Portal vein is patent on color Doppler imaging with normal direction of blood flow towards the liver. Other: Negative visible right kidney. IMPRESSION: 1. Chronically dilated CBD. No intraductal filling defect identified. 2. Gallbladder sludge, but no cholelithiasis or strong evidence of acute cholecystitis. 3. Fatty liver. Electronically Signed   By: Genevie Ann M.D.   On: 02/12/2021 09:40    Microbiology: Recent Results (from the past 240 hour(s))  Resp Panel by RT-PCR (Flu A&B, Covid) Nasopharyngeal Swab     Status: None   Collection Time: 02/12/21  5:43 AM   Specimen: Nasopharyngeal Swab; Nasopharyngeal(NP) swabs in vial transport medium  Result Value Ref Range Status   SARS Coronavirus 2 by RT PCR  NEGATIVE NEGATIVE Final    Comment: (NOTE) SARS-CoV-2 target nucleic acids are NOT DETECTED.  The SARS-CoV-2 RNA is generally detectable in upper respiratory specimens during the acute phase of infection. The lowest concentration of SARS-CoV-2 viral copies this assay can detect is 138 copies/mL. A negative result does not preclude SARS-Cov-2 infection and should not be used as the sole basis for treatment or other patient management decisions. A negative result may occur with  improper specimen collection/handling, submission of specimen other than nasopharyngeal swab, presence of viral mutation(s) within the areas targeted by this assay, and inadequate number of viral copies(<138 copies/mL). A negative result must be combined with clinical  observations, patient history, and epidemiological information. The expected result is Negative.  Fact Sheet for Patients:  EntrepreneurPulse.com.au  Fact Sheet for Healthcare Providers:  IncredibleEmployment.be  This test is no t yet approved or cleared by the [place] FDA and  has been authorized for detection and/or diagnosis of SARS-CoV-2 by FDA under an Emergency Use Authorization (EUA). This EUA will remain  in effect (meaning this test can be used) for the duration of the COVID-19 declaration under Section 564(b)(1) of the Act, 21 U.S.C.section 360bbb-3(b)(1), unless the authorization is terminated  or revoked sooner.       Influenza A by PCR NEGATIVE NEGATIVE Final   Influenza B by PCR NEGATIVE NEGATIVE Final    Comment: (NOTE) The Xpert Xpress SARS-CoV-2/FLU/RSV plus assay is intended as an aid in the diagnosis of influenza from Nasopharyngeal swab specimens and should not be used as a sole basis for treatment. Nasal washings and aspirates are unacceptable for Xpert Xpress SARS-CoV-2/FLU/RSV testing.  Fact Sheet for Patients: EntrepreneurPulse.com.au  Fact Sheet for Healthcare Providers: IncredibleEmployment.be  This test is not yet approved or cleared by the [place] FDA and has been authorized for detection and/or diagnosis of SARS-CoV-2 by FDA under an Emergency Use Authorization (EUA). This EUA will remain in effect (meaning this test can be used) for the duration of the COVID-19 declaration under Section 564(b)(1) of the Act, 21 U.S.C. section 360bbb-3(b)(1), unless the authorization is terminated or revoked.  Performed at [facility], [address] [place], [place] [address]      Labs: Basic Metabolic Panel: Recent Labs  Lab 02/12/21 0108 02/13/21 0416 02/14/21 0430 02/15/21 0432 02/16/21 0414  NA 134* 138 136 135 136  K 3.0* 3.0* 3.1* 4.6 4.7  CL 94* 100 102 102  101  CO2 25 30 30 26 27   GLUCOSE 98 118* 129* 110* 112*  BUN 11 7* 6* 8 11  CREATININE 0.57 0.56 0.46 0.51 0.44  CALCIUM 9.4 9.0 8.9 9.4 9.6  MG  --  1.7 1.7  --   --    Liver Function Tests: Recent Labs  Lab 02/12/21 0108 02/13/21 0416 02/14/21 0430  AST 24 18 16   ALT 18 13 13   ALKPHOS 85 64 65  BILITOT 1.2 1.0 0.3  PROT 7.4 5.9* 6.2*  ALBUMIN 3.7 2.9* 3.0*   Recent Labs  Lab 02/12/21 0108  LIPASE 32   No results for input(s): AMMONIA in the last 168 hours. CBC: Recent Labs  Lab 02/12/21 0108 02/13/21 0416  WBC 7.4 4.6  NEUTROABS 5.0 2.2  HGB 12.7 11.4*  HCT 41.9 37.7  MCV 103.5* 105.0*  PLT 362 257   Cardiac Enzymes: No results for input(s): CKTOTAL, CKMB, CKMBINDEX, TROPONINI in the last 168 hours. BNP: Invalid input(s): POCBNP CBG: No results for input(s): GLUCAP in the last 168 hours.  Time coordinating discharge:  36 minutes  Signed:  Orson Eva, DO [facility] Pager: [phone] 02/16/2021, 1:20 PM

## 2021-02-16 NOTE — TOC Transition Note (Signed)
Transition of Care [facility]) - CM/SW Discharge Note   Patient Details  Name: Jasmine [name] MRN: [id number] Date of Birth: [birth year]  [facility] CM/SW Contact:  Shade Flood, LCSW Phone Number: 02/16/2021, 1:18 PM   Clinical Narrative:     Pt stable for dc home today per MD. There are no TOC needs for dc.  Final next level of care: Home/Self Care Barriers to Discharge: Barriers Resolved   Patient Goals and CMS Choice Patient states their goals for this hospitalization and ongoing recovery are:: go home      Discharge Placement                       Discharge Plan and Services In-house Referral: Clinical Social Work   Post Acute Care Choice: Durable Medical Equipment                               Social Determinants of Health (SDOH) Interventions     Readmission Risk Interventions Readmission Risk Prevention Plan 02/14/2021 10/13/2020 01/07/2019  Transportation Screening Complete Complete Complete  Palliative Care Screening - - Not Applicable  Medication Review ([place]) Complete Complete Complete  PCP or Specialist appointment within 3-5 days of discharge - Complete -  [place] or Home Care Consult Patient refused Complete -  SW Recovery Care/Counseling Consult Complete Complete -  Palliative Care Screening Not Applicable Not Applicable -  [place] Not Applicable Not Applicable -  Some recent data might be hidden

## 2021-02-16 NOTE — Progress Notes (Signed)
Patient discharged home today, transported by family home. Discharge paperwork went over with patient, patient verbalized understanding of discharge paperwork. Belongings sent home with patient.

## 2021-02-16 NOTE — Care Management Important Message (Signed)
Important Message  Patient Details  Name: Jasmine [name] MRN: [id number] Date of Birth: 16-Nov-1955   Medicare Important Message Given:  N/A - LOS <3 / Initial given by admissions     [name] 02/16/2021, 1:10 PM

## 2021-02-20 ENCOUNTER — Encounter: Payer: Self-pay | Admitting: Family Medicine

## 2021-02-21 ENCOUNTER — Encounter (HOSPITAL_COMMUNITY): Payer: Self-pay | Admitting: Radiology

## 2021-02-21 ENCOUNTER — Other Ambulatory Visit: Payer: Self-pay

## 2021-02-21 ENCOUNTER — Ambulatory Visit (HOSPITAL_COMMUNITY)
Admission: RE | Admit: 2021-02-21 | Discharge: 2021-02-21 | Disposition: A | Discharge status: Home / Self Care | Payer: Medicare Other | Source: Ambulatory Visit | Attending: Hematology | Admitting: Hematology

## 2021-02-21 DIAGNOSIS — C3491 Malignant neoplasm of unspecified part of right bronchus or lung: Secondary | ICD-10-CM | POA: Diagnosis present

## 2021-02-21 MED ORDER — IOHEXOL 300 MG/ML  SOLN
75.0000 mL | Freq: Once | INTRAMUSCULAR | Status: AC | PRN
  Administered 2021-02-21: 15:00:00 75 mL via INTRAVENOUS

## 2021-02-22 ENCOUNTER — Telehealth: Payer: Self-pay | Admitting: Family Medicine

## 2021-02-22 ENCOUNTER — Other Ambulatory Visit: Payer: Self-pay | Admitting: Family Medicine

## 2021-02-22 DIAGNOSIS — N35919 Unspecified urethral stricture, male, unspecified site: Secondary | ICD-10-CM

## 2021-02-22 DIAGNOSIS — N3281 Overactive bladder: Secondary | ICD-10-CM

## 2021-02-22 MED ORDER — MIRABEGRON ER 25 MG PO TB24: 25.0000 mg | ORAL_TABLET | Freq: Every day | ORAL | 0 refills | Status: DC

## 2021-02-23 ENCOUNTER — Telehealth: Payer: Self-pay | Admitting: Family Medicine

## 2021-02-23 DIAGNOSIS — N3281 Overactive bladder: Secondary | ICD-10-CM

## 2021-02-23 DIAGNOSIS — N35919 Unspecified urethral stricture, male, unspecified site: Secondary | ICD-10-CM

## 2021-02-23 NOTE — Telephone Encounter (Signed)
PA started  Key: [id number]    PA has already submitted and is in process for this patient and drug.;CaseId:[id number];Status:In Process;   No samples available

## 2021-02-23 NOTE — Telephone Encounter (Signed)
No samples available, husband aware

## 2021-02-23 NOTE — Progress Notes (Signed)
[place], [place] [address]   CLINIC:  Medical Oncology/Hematology  PCP:  Janora Norlander, DO [address] 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  PRIOR THERAPY: Right upper lobectomy on 07/29/2020 with Dr.Hendrickson  CURRENT THERAPY: under work-up  BRIEF ONCOLOGIC HISTORY:  Oncology History  Squamous cell lung cancer, right ([place])  01/13/2021 Initial Diagnosis   Squamous cell lung cancer, right ([place])   01/13/2021 Cancer Staging   Staging form: Lung, AJCC 8th Edition - Clinical stage from 01/13/2021: Stage IB (cT2a, cN0, cM0) - Signed by Derek Jack, MD on 01/13/2021 Histopathologic type: Squamous cell carcinoma, NOS Stage prefix: Initial diagnosis      CANCER STAGING:  Cancer Staging  Squamous cell lung cancer, right ([place]) Staging form: Lung, AJCC 8th Edition - Clinical stage from 01/13/2021: Stage IB (cT2a, cN0, cM0) - Signed by Derek Jack, MD on 01/13/2021   INTERVAL HISTORY:  Jasmine [name], a 65 y.o. 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 Jasmine [name] was last seen on 01/13/2021.   Today she reports feeling good. She reports good energy. She takes iron tablets once daily.   REVIEW OF SYSTEMS:  Review of Systems  Constitutional:  Negative for appetite change (75%) and fatigue (50%).  HENT:   Positive for trouble swallowing.   Respiratory:  Positive for shortness of breath.   Genitourinary:  Positive for dysuria.   Musculoskeletal:  Positive for back pain (7/10) and flank pain (R side).  Neurological:  Positive for headaches.  All other systems reviewed and are negative.  PAST MEDICAL/SURGICAL HISTORY:  Past Medical History:  Diagnosis Date   Allergy    Anemia    Anxiety    Arthritis    Asthma    Carpal tunnel syndrome    Chronic back pain    Cigarette smoker  1/76/1607   Complication of anesthesia    in the past has had N/V, the last few surgeries she has not been   COPD (chronic obstructive pulmonary disease) ([place])    Easy bruising 07/10/2016   GERD (gastroesophageal reflux disease)    Headache    Heart murmur 2005   never had any problems   History of blood transfusion 2018   History of kidney stones    Hypertension    Hypoglycemia    Hypoglycemia    Iron deficiency anemia 07/11/2016   Neuropathy    both arms   Normocytic anemia 07/29/2015   Pancreatitis    Pneumonia    PONV (postoperative nausea and vomiting)    Postoperative anemia due to acute blood loss 11/15/2015   R lung cancer    Sciatica    Seizures ([place])    as a child when she would have an asthma attack. none as an adult   Shortness of breath dyspnea    Past Surgical History:  Procedure Laterality Date   ABDOMINAL HYSTERECTOMY  3710   APPLICATION OF ROBOTIC ASSISTANCE FOR SPINAL PROCEDURE N/A 09/05/2016   Procedure: APPLICATION OF ROBOTIC ASSISTANCE FOR SPINAL PROCEDURE;  Surgeon: Consuella Lose, MD;  Location: [facility];  Service: Neurosurgery;  Laterality: N/A;   BACK SURGERY     BIOPSY  06/14/2020   Procedure: BIOPSY;  Surgeon: Daneil Dolin, MD;  [facility];  Service: Endoscopy;;   CERVICAL FUSION     CESAREAN  SECTION  1982   COLONOSCOPY     unsure last   COLONOSCOPY WITH PROPOFOL N/A 06/14/2020   Procedure: COLONOSCOPY WITH PROPOFOL;  Surgeon: Daneil Dolin, MD;  [facility];  Service: Endoscopy;  Laterality: N/A;  PM   cyst removal face     disk repair     neck and lumbar region   ESOPHAGOGASTRODUODENOSCOPY (EGD) WITH PROPOFOL N/A 06/14/2020   Procedure: ESOPHAGOGASTRODUODENOSCOPY (EGD) WITH PROPOFOL;  Surgeon: Daneil Dolin, MD;  [facility];  Service: Endoscopy;  Laterality: N/A;   FOOT SURGERY Bilateral    to file bones down    HARDWARE REMOVAL N/A 09/05/2016   Procedure: HARDWARE REMOVAL AND REPLACEMENT OF LUMBAR  FIVE SCREWS;  Surgeon: Consuella Lose, MD;  Location: [facility];  Service: Neurosurgery;  Laterality: N/A;   IMPLANTATION / PLACEMENT EPIDURAL NEUROSTIMULATOR ELECTRODES     INTERCOSTAL NERVE BLOCK Right 07/29/2020   Procedure: INTERCOSTAL NERVE BLOCK;  Surgeon: Melrose Nakayama, MD;  Location: [facility];  Service: Thoracic;  Laterality: Right;   LAMINECTOMY     2006   lobectomy Right 07/2020   LUMBAR FUSION     LYMPH NODE BIOPSY Right 07/29/2020   Procedure: LYMPH NODE BIOPSY;  Surgeon: Melrose Nakayama, MD;  Location: [facility];  Service: Thoracic;  Laterality: Right;   POLYPECTOMY  06/14/2020   Procedure: POLYPECTOMY INTESTINAL;  Surgeon: Daneil Dolin, MD;  [facility];  Service: Endoscopy;;   SPINAL CORD STIMULATOR BATTERY EXCHANGE     SPINAL CORD STIMULATOR INSERTION N/A 07/05/2016   Procedure: REPLACEMENT OF LUMBAR SPINAL CORD STIMULATOR BATTERY;  Surgeon: Newman Pies, MD;  Location: [facility];  Service: Neurosurgery;  Laterality: N/A;   TONSILLECTOMY      SOCIAL HISTORY:  Social History   Socioeconomic History   Marital status: Married    Spouse name: [name]   Number of children: 1   Years of education: Not on file   Highest education level: Some college, no degree  Occupational History   Not on file  Tobacco Use   Smoking status: Every Day    Packs/day: 0.25    Years: 35.00    Pack years: 8.75    Types: Cigarettes   Smokeless tobacco: Never   Tobacco comments:    trying to quit  Vaping Use   Vaping Use: Never used  Substance and Sexual Activity   Alcohol use: Yes    Alcohol/week: 2.0 standard drinks    Types: 2 Glasses of wine per week    Comment: occasionally   Drug use: No   Sexual activity: Yes    Birth control/protection: None  Other Topics Concern   Not on file  Social History Narrative   Retired, 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 1 son, deceased. 2 pet dogs. Enjoys watching TV.   Social Determinants of Health    Financial Resource Strain: Not on file  Food Insecurity: Not on file  Transportation Needs: Not on file  Physical Activity: Not on file  Stress: Not on file  Social Connections: Not on file  Intimate Partner Violence: Not At Risk   Fear of Current or Ex-Partner: No   Emotionally Abused: No   Physically Abused: No   Sexually Abused: No    FAMILY HISTORY:  Family History  Problem Relation Age of Onset   Heart disease Mother    Diabetes Mother    Hypertension Mother    Hyperlipidemia Mother  COPD Mother        smoked   Cancer Mother        bile duct   COPD Father    Diabetes Father    Heart disease Father    Cancer Father        throat   Dementia Father    Asthma Son    Hypertension Sister    Hyperlipidemia Sister    Hypertension Brother    Hyperlipidemia Brother    Hypertension Brother    Hyperlipidemia Brother    Emphysema Maternal Grandmother        smoked   Colon cancer Neg Hx    Colon polyps Neg Hx     CURRENT MEDICATIONS:  Current Outpatient Medications  Medication Sig Dispense Refill   albuterol (VENTOLIN HFA) 108 (90 Base) MCG/ACT inhaler Inhale into the lungs every 6 (six) hours as needed for wheezing or shortness of breath.     amLODipine (NORVASC) 10 MG tablet Take 1 tablet (10 mg total) by mouth daily. 90 tablet 3   atorvastatin (LIPITOR) 40 MG tablet Take 1 tablet (40 mg total) by mouth daily. 90 tablet 3   budesonide-formoterol (SYMBICORT) 160-4.5 MCG/ACT inhaler USE 2 INHALATIONS TWICE A DAY 20.4 g 5   Cholecalciferol 125 MCG (5000 UT) capsule Take 5,000 Units by mouth daily.     dextromethorphan-guaiFENesin (MUCINEX DM) 30-600 MG 12hr tablet Take 1 tablet by mouth 2 (two) times daily.     DULoxetine (CYMBALTA) 30 MG capsule Take 3 capsules (90 mg total) by mouth daily. 270 capsule 4   ipratropium-albuterol (DUONEB) 0.5-2.5 (3) MG/3ML SOLN Inhale 3 mLs into the lungs 4 (four) times daily as needed (for shortness of breath/wheezing). *May use as  needed     Iron Polysacch Cmplx-B12-FA (POLY-IRON 150 FORTE) 150-0.025-1 MG CAPS Take 1 capsule by mouth 2 (two) times daily. 60 capsule 2   L-Methylfolate-Algae-B12-B6 (METANX) 3-90.314-2-35 MG CAPS TAKE 1 TABLET BY MOUTH TWO TIMES A DAY (Patient taking differently: Take 1 tablet by mouth daily.) 180 capsule 4   magnesium oxide (MAG-OX) 400 (241.3 Mg) MG tablet Take 400 mg by mouth 2 (two) times daily.     mirabegron ER (MYRBETRIQ) 25 MG TB24 tablet Take 1 tablet (25 mg total) by mouth daily for 10 days. 10 tablet 0   mirabegron ER (MYRBETRIQ) 25 MG TB24 tablet Take 1 tablet (25 mg total) by mouth daily. 90 tablet 0   nicotine (NICODERM CQ) 14 mg/24hr patch Place 1 patch (14 mg total) onto the skin daily. 30 patch 0   Nutritional Supplements (ENSURE HIGH PROTEIN) LIQD Drink 1 can 3 times daily. 21330 mL PRN   omeprazole (PRILOSEC) 40 MG capsule Take one capsule once to twice daily before a meal. 90 capsule 3   ondansetron (ZOFRAN ODT) 4 MG disintegrating tablet Take 1 tablet (4 mg total) by mouth every 8 (eight) hours as needed for nausea or vomiting. 20 tablet 0   oxyCODONE-acetaminophen (PERCOCET) 10-325 MG tablet Take 1 tablet by mouth 5 (five) times daily.     OXYCONTIN 15 MG 12 hr tablet Take 15 mg by mouth every 12 (twelve) hours.     OXYGEN Inhale 3 L into the lungs See admin instructions. continuous     SPIRIVA RESPIMAT 2.5 MCG/ACT AERS USE 2 INHALATIONS DAILY (Patient taking differently: Inhale 2 puffs into the lungs daily.) 12 g 3   tizanidine (ZANAFLEX) 6 MG capsule Take 6 mg by mouth 3 (three) times daily.  traZODone (DESYREL) 150 MG tablet TAKE 1 TO 2 TABLETS AT BEDTIME (Patient taking differently: Take 150-300 mg by mouth at bedtime.) 180 tablet 3   No current facility-administered medications for this visit.    ALLERGIES:  Allergies  Allergen Reactions   Lyrica [Pregabalin] Other (See Comments)    EXTREME SHAKING/TREMBLING   Darvon [Propoxyphene]     UNSPECIFIED REACTION     Gabapentin     Extreme shaking and trembling    Augmentin [Amoxicillin-Pot Clavulanate] Nausea And Vomiting    Has patient had a PCN reaction causing immediate rash, facial/tongue/throat swelling, SOB or lightheadedness with hypotension:no Has patient had a PCN reaction causing severe rash involving mucus membranes or skin necrosis:No Has patient had a PCN reaction that required hospitalization:No Has patient had a PCN reaction occurring within the last 10 years:Yes--ONLY N/V If all of the above answers are "NO", then may proceed with Cephalosporin use.    Erythromycin Itching and Rash        Vibramycin [Doxycycline Calcium] Itching and Rash    "SPLOTCHING "    PHYSICAL EXAM:  Performance status (ECOG): 1 - Symptomatic but completely ambulatory  Vitals:   02/24/21 1519  BP: 129/70  Pulse: (!) 113  Resp: 20  SpO2: 99%   Wt Readings from Last 3 Encounters:  02/24/21 102 lb 2 oz (46.3 kg)  02/15/21 95 lb 10.9 oz (43.4 kg)  01/13/21 102 lb 3.2 oz (46.4 kg)   Physical Exam Vitals reviewed.  Constitutional:      Appearance: Normal appearance.     Interventions: Nasal cannula in place.  Cardiovascular:     Rate and Rhythm: Normal rate and regular rhythm.     Pulses: Normal pulses.     Heart sounds: Normal heart sounds.  Pulmonary:     Effort: Pulmonary effort is normal.     Breath sounds: Normal breath sounds.  Neurological:     General: No focal deficit present.     Mental Status: She is alert and oriented to person, place, and time.  Psychiatric:        Mood and Affect: Mood normal.        Behavior: Behavior normal.     LABORATORY DATA:  I have reviewed the labs as listed.  CBC Latest Ref Rng & Units 02/13/2021 02/12/2021 01/13/2021  WBC 4.0 - 10.5 K/uL 4.6 7.4 6.6  Hemoglobin 12.0 - 15.0 g/dL 11.4(L) 12.7 10.6(L)  Hematocrit 36.0 - 46.0 % 37.7 41.9 34.8(L)  Platelets 150 - 400 K/uL 257 362 218   CMP Latest Ref Rng & Units 02/16/2021 02/15/2021 02/14/2021   Glucose 70 - 99 mg/dL 112(H) 110(H) 129(H)  BUN 8 - 23 mg/dL 11 8 6(L)  Creatinine 0.44 - 1.00 mg/dL 0.44 0.51 0.46  Sodium 135 - 145 mmol/L 136 135 136  Potassium 3.5 - 5.1 mmol/L 4.7 4.6 3.1(L)  Chloride 98 - 111 mmol/L 101 102 102  CO2 22 - 32 mmol/L 27 26 30   Calcium 8.9 - 10.3 mg/dL 9.6 9.4 8.9  Total Protein 6.5 - 8.1 g/dL - - 6.2(L)  Total Bilirubin 0.3 - 1.2 mg/dL - - 0.3  Alkaline Phos 38 - 126 U/L - - 65  AST 15 - 41 U/L - - 16  ALT 0 - 44 U/L - - 13    DIAGNOSTIC IMAGING:  I have independently reviewed the scans and discussed with the patient. CT Chest W Contrast  Result Date: 02/22/2021 CLINICAL DATA:  Non-small-cell lung cancer.  Restaging. EXAM:  CT CHEST WITH CONTRAST TECHNIQUE: Multidetector CT imaging of the chest was performed during intravenous contrast administration. CONTRAST:  43mL OMNIPAQUE IOHEXOL 300 MG/ML  SOLN COMPARISON:  02/12/2021 FINDINGS: Cardiovascular: The heart size is normal. No substantial pericardial effusion. Coronary artery calcification is evident. Moderate atherosclerotic calcification is noted in the wall of the thoracic aorta. Mediastinum/Nodes: No mediastinal lymphadenopathy. There is no hilar lymphadenopathy. The esophagus has normal imaging features. There is no axillary lymphadenopathy. Lungs/Pleura: Centrilobular and paraseptal emphysema evident. Status post right upper lobectomy. Stable scarring right middle lobe 3 mm peripheral left upper lobe nodule on 36/4 is new in the interval interval increase in small right pleural effusion. Upper Abdomen: Similar intra and extrahepatic biliary duct dilatation. Diffuse dilatation of the main pancreatic duct again noted. Pancreatic head and ampulla have not been included on the study. Musculoskeletal: No worrisome lytic or sclerotic osseous abnormality. Thoracic spinal stimulator device evident. IMPRESSION: 1. Status post right upper lobectomy. No findings to suggest definite recurrent or metastatic disease  in the chest. 2. Interval increase in small right pleural effusion. 3. 3 mm peripheral left upper lobe pulmonary nodule, new in the interval. Attention on follow-up recommended. 4. Similar intra and extrahepatic biliary duct dilatation with diffuse dilatation of the main pancreatic duct. Pancreatic head and ampulla have not been included on the study. CTA 11/26/2020 described a 7 mm calcification in the region of the ampulla. MRCP follow-up could be used to further evaluate as clinically warranted. 5. Aortic Atherosclerosis (ICD10-I70.0) and Emphysema (ICD10-J43.9). Electronically Signed   By: Misty Stanley M.D.   On: 02/22/2021 10:38   CT Angio Chest Pulmonary Embolism (PE) W or WO Contrast  Result Date: 02/12/2021 CLINICAL DATA:  65 year old female with nausea, chest pain, pain between the shoulder blades. 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 EXAM: CT ANGIOGRAPHY CHEST WITH CONTRAST TECHNIQUE: Multidetector CT imaging of the chest was performed using the standard protocol during bolus administration of intravenous contrast. Multiplanar CT image reconstructions and MIPs were obtained to evaluate the vascular anatomy. CONTRAST:  155mL OMNIPAQUE IOHEXOL 350 MG/ML SOLN COMPARISON:  Portable chest 0122 hours. Chest CTA 08/25/2020. CT Chest, Abdomen, and Pelvis 11/26/2020. FINDINGS: Cardiovascular: Good contrast bolus timing in the pulmonary arterial tree. No focal filling defect identified in the pulmonary arteries to suggest acute pulmonary embolism. Calcified coronary artery atherosclerosis. Calcified aortic atherosclerosis. No cardiomegaly or pericardial effusion. Mediastinum/Nodes: No mediastinal mass or lymphadenopathy. Postoperative changes at the right hilum. Lungs/Pleura: Postoperative changes of right upper lobectomy. Underlying emphysema. Architectural distortion about the right hilum. Small volume retained secretions in the right mainstem bronchus and bronchus  intermedius. Stable lung volumes and ventilation compared to September. Occasional mild right lower lobe subpleural scarring or atelectasis. No pleural effusion. Upper Abdomen: Negative visible spleen, adrenal glands, left kidney, and bowel in the upper abdomen. Chronic intra and extrahepatic biliary ductal dilatation partially visible and stable. Stable partially visible gallbladder. Chronic pancreatic atrophy with prominence of the main pancreatic duct partially visible and stable. Musculoskeletal: Osteopenia. Previous cervical ACDF. Chronic midthoracic spinal stimulator. No acute or suspicious osseous lesion identified. Review of the MIP images confirms the above findings. IMPRESSION: 1. No evidence of acute pulmonary embolus. 2. Small volume retained secretions in the right mainstem bronchus. Otherwise stable right upper lobectomy, Emphysema (ICD10-J43.9). 3. Calcified coronary artery and Aortic Atherosclerosis (ICD10-I70.0). Electronically Signed   By: Genevie Ann M.D.   On: 02/12/2021 04:46   DG Chest Port 1 View  Result Date: 02/12/2021 CLINICAL  DATA:  Chest pain EXAM: PORTABLE CHEST 1 VIEW COMPARISON:  01/04/2021 FINDINGS: Postsurgical changes in the right hemithorax. Nodular opacity in the right perihilar region likely reflect scarring when correlating with prior CT. Left lung is essentially clear. Mild blunting of the right costophrenic angle, likely postsurgical. No definite pleural effusion or pneumothorax. The heart is normal in size.  Thoracic aortic atherosclerosis. Thoracic spine stimulator. IMPRESSION: Postsurgical changes in the right hemithorax. No evidence of acute cardiopulmonary disease. Electronically Signed   By: Julian Hy M.D.   On: 02/12/2021 01:44   US Abdomen Limited RUQ (LIVER/GB)  Result Date: 02/12/2021 CLINICAL DATA:  65 year old female with nausea, pain. EXAM: ULTRASOUND ABDOMEN LIMITED RIGHT UPPER QUADRANT COMPARISON:  CTA chest 0415 hours today. CT Chest, Abdomen, and  Pelvis today are reported separately. 11/26/2020. Ultrasound 01/06/2019. FINDINGS: Gallbladder: Evidence of some sludge. No shadowing echogenic stones. Gallbladder size relatively normal. Gallbladder wall thickness at the upper limits of normal 2-3 mm. No pericholecystic fluid. Common bile duct: Diameter: 10 mm, chronically dilated. In 2020 the CBD measured up to 17 mm. And roughly 16 mm on the September CT Abdomen and Pelvis. No intraductal filling defect identified. Liver: Echogenic liver (image 33). No discrete liver lesion. Little to no intrahepatic biliary ductal dilatation as demonstrated by CT. Portal vein is patent on color Doppler imaging with normal direction of blood flow towards the liver. Other: Negative visible right kidney. IMPRESSION: 1. Chronically dilated CBD. No intraductal filling defect identified. 2. Gallbladder sludge, but no cholelithiasis or strong evidence of acute cholecystitis. 3. Fatty liver. Electronically Signed   By: Genevie Ann M.D.   On: 02/12/2021 09:40     ASSESSMENT:  Stage I T2N0 right squamous cell lung cancer: - Right upper lobectomy and excision of mediastinal and hilar lymph nodes on 07/29/2020 - Pathology shows 2.4 cm squamous cell carcinoma, no lymphovascular invasion, margins negative, 0/16 lymph nodes involved, [id number]. - CT chest on 11/26/2020 with postsurgical changes in the right upper lobe.  Similar appearance of 14 x 19 mm nodular density anterior to the right hilum, likely scarring.  This was present on the CT angiogram from 08/25/2020 but not on the presurgical CT scan on 07/21/2020.    Social/family history: - She worked in [employer] prior to retirement. - She is currently smoking 15 cigarettes/day since surgery.  Prior to that she used to smoke 2 to 2-1/2 packs/day for past 20 years. - Mother had bile duct cancer.  Father had throat cancer.  Maternal great grandfather had brain cancer.  Brother had kidney cancer.  Sister had thyroid and kidney  cancer.   PLAN:  Stage I T2N0 right squamous cell lung cancer: - She does not have any high risk features to merit adjuvant chemotherapy. - We have discussed surveillance plan for stage I lung cancer with CT scans every 3-6 months. - We have reviewed CT chest from 02/22/2021 which showed postsurgical changes with very small right pleural effusion.  3 mm peripheral left upper lobe pulmonary nodule, new in the interval. - Recommend follow-up in 4 months.  Recommend CT chest with contrast to follow-up on the left upper lobe lung nodule.  If the scan is stable in 4 months, we will switch her to every 61-month CT scans.  2.  Macrocytic anemia: - She has been taking iron tablet twice daily for the last few years. - We have reviewed labs which showed Y60, folic acid, methylmalonic acid, SPEP and copper levels were normal.  Ferritin was low at  8. - She is likely not absorbing iron tablets. - Recommend Feraheme x2 weekly. - We discussed side effects in detail.     Orders placed this encounter:  No orders of the defined types were placed in this encounter.    Derek Jack, MD [facility] [phone]   I, Thana Ates, am acting as a scribe for Dr. Derek Jack.  I, Derek Jack MD, have reviewed the above documentation for accuracy and completeness, and I agree with the above.

## 2021-02-23 NOTE — Telephone Encounter (Signed)
Can complete PA if needed, but may take some time - these are covered by her plan:  Darifenacin Hydrobromide ER Not Required Fesoterodine Fumarate ER Not Required Oxybutynin Chloride Not Required Oxybutynin Chloride ER Not Required Solifenacin Succinate Not Required Tolterodine Tartrate ER Not Required Trospium Chloride Not Required  Would you like to change med or [place] do the PA ?

## 2021-02-24 ENCOUNTER — Encounter (HOSPITAL_COMMUNITY): Payer: Self-pay | Admitting: Hematology

## 2021-02-24 ENCOUNTER — Other Ambulatory Visit: Payer: Self-pay

## 2021-02-24 ENCOUNTER — Inpatient Hospital Stay (HOSPITAL_COMMUNITY): Payer: Medicare Other | Attending: Hematology | Admitting: Hematology

## 2021-02-24 VITALS — BP 129/70 | HR 113 | Resp 20 | Wt 102.1 lb

## 2021-02-24 DIAGNOSIS — Z79899 Other long term (current) drug therapy: Secondary | ICD-10-CM | POA: Insufficient documentation

## 2021-02-24 DIAGNOSIS — F17210 Nicotine dependence, cigarettes, uncomplicated: Secondary | ICD-10-CM | POA: Insufficient documentation

## 2021-02-24 DIAGNOSIS — C3491 Malignant neoplasm of unspecified part of right bronchus or lung: Secondary | ICD-10-CM

## 2021-02-24 DIAGNOSIS — Z85118 Personal history of other malignant neoplasm of bronchus and lung: Secondary | ICD-10-CM | POA: Diagnosis present

## 2021-02-24 DIAGNOSIS — D539 Nutritional anemia, unspecified: Secondary | ICD-10-CM | POA: Insufficient documentation

## 2021-02-24 NOTE — Patient Instructions (Signed)
[place] at [facility] Discharge Instructions  You were seen and examined today by Dr. Delton Coombes.   He reviewed your most recent labs and CT scan. Your iron has decreased and he is setting you up for IV iron transfusion. Your CT scan looks good shows slight increase of fluid and a small spot on the Left lung but we will monitor this with CT scans.   Please keep follow up appointment as scheduled in 4 months.   Thank you for choosing [place] at [facility] to provide your oncology and hematology care.  To afford each patient quality time with our provider, please arrive at least 15 minutes before your scheduled appointment time.   If you have a lab appointment with the [facility] please come in thru the Main Entrance and check in at the main information desk.  You need to re-schedule your appointment should you arrive 10 or more minutes late.  We strive to give you quality time with our providers, and arriving late affects you and other patients whose appointments are after yours.  Also, if you no show three or more times for appointments you may be dismissed from the clinic at the providers discretion.     Again, thank you for choosing [facility].  Our hope is that these requests will decrease the amount of time that you wait before being seen by our physicians.       _____________________________________________________________  Should you have questions after your visit to [facility], please contact our office at [phone] and follow the prompts.  Our office hours are 8:00 a.m. and 4:30 p.m. Monday - Friday.  Please note that voicemails left after 4:00 p.m. may not be returned until the following business day.  We are closed weekends and major holidays.  You do have access to a nurse 24-7, just call the main number to the clinic [phone] and do not press any options, hold on the line and a nurse will  answer the phone.    For prescription refill requests, have your pharmacy contact our office and allow 72 hours.    Due to Covid, you will need to wear a mask upon entering the hospital. If you do not have a mask, a mask will be given to you at the Main Entrance upon arrival. For doctor visits, patients may have 1 support person age 67 or older with them. For treatment visits, patients can not have anyone with them due to social distancing guidelines and our immunocompromised population.

## 2021-02-25 ENCOUNTER — Other Ambulatory Visit: Payer: Self-pay | Admitting: Family

## 2021-02-25 MED ORDER — MIRABEGRON ER 25 MG PO TB24: 25.0000 mg | ORAL_TABLET | Freq: Every day | ORAL | 0 refills | Status: DC

## 2021-02-25 NOTE — Telephone Encounter (Signed)
Insurance is not going to let us complete a PA for [employer] and [employer]. 10 day supply sent to [facility] for patient and they will let us know if they are unable to get.

## 2021-02-25 NOTE — Telephone Encounter (Signed)
Before insurance will pay for myrbetriq patient must try and fail oxybutynin, solifenacin, tolterodine, trospium chloride and also have failed behavioral interventions to include pelvic floor muscle training in women, and bladder training.

## 2021-02-25 NOTE — Addendum Note (Signed)
Addended by: Thana Ates on: 02/25/2021 10:01 AM   Modules accepted: Orders

## 2021-03-01 ENCOUNTER — Ambulatory Visit (HOSPITAL_COMMUNITY): Payer: Medicare Other | Admitting: Hematology

## 2021-03-01 ENCOUNTER — Other Ambulatory Visit: Payer: Self-pay | Admitting: Family Medicine

## 2021-03-01 MED ORDER — OXYBUTYNIN CHLORIDE ER 5 MG PO TB24: 5.0000 mg | ORAL_TABLET | Freq: Every day | ORAL | 0 refills | Status: DC

## 2021-03-01 NOTE — Telephone Encounter (Signed)
Pt is ok with trying one of the alternatives.

## 2021-03-01 NOTE — Telephone Encounter (Signed)
If we have samples, she is welcome to some but this is not sustainable and insurance will continue to refuse med until she has tried alternatives.  I can send an alternative if she wishes to proceed.

## 2021-03-01 NOTE — Telephone Encounter (Signed)
Oxybutynin sent

## 2021-03-02 ENCOUNTER — Other Ambulatory Visit: Payer: Self-pay

## 2021-03-02 ENCOUNTER — Inpatient Hospital Stay (HOSPITAL_COMMUNITY): Payer: Medicare Other

## 2021-03-02 VITALS — BP 150/77 | HR 97 | Temp 98.2°F | Resp 18

## 2021-03-02 DIAGNOSIS — Z85118 Personal history of other malignant neoplasm of bronchus and lung: Secondary | ICD-10-CM | POA: Diagnosis not present

## 2021-03-02 DIAGNOSIS — D508 Other iron deficiency anemias: Secondary | ICD-10-CM

## 2021-03-02 MED ORDER — SODIUM CHLORIDE 0.9 % IV SOLN
Freq: Once | INTRAVENOUS | Status: AC
Start: 2021-03-02 — End: 2021-03-02

## 2021-03-02 MED ORDER — ACETAMINOPHEN 325 MG PO TABS
650.0000 mg | ORAL_TABLET | Freq: Once | ORAL | Status: AC
  Administered 2021-03-02: 15:00:00 650 mg via ORAL
  Filled 2021-03-02: qty 2

## 2021-03-02 MED ORDER — SODIUM CHLORIDE 0.9 % IV SOLN
510.0000 mg | Freq: Once | INTRAVENOUS | Status: AC
  Administered 2021-03-02: 16:00:00 510 mg via INTRAVENOUS
  Filled 2021-03-02: qty 17

## 2021-03-02 MED ORDER — LORATADINE 10 MG PO TABS
10.0000 mg | ORAL_TABLET | Freq: Once | ORAL | Status: AC
  Administered 2021-03-02: 15:00:00 10 mg via ORAL
  Filled 2021-03-02: qty 1

## 2021-03-02 NOTE — Telephone Encounter (Signed)
Patient aware and verbalized understanding. °

## 2021-03-02 NOTE — Patient Instructions (Signed)
[facility]  Discharge Instructions: Thank you for choosing [facility] to provide your oncology and hematology care.  If you have a lab appointment with the Cancer Center, please come in thru the Main Entrance and check in at the main information desk.  Wear comfortable clothing and clothing appropriate for easy access to any Portacath or PICC line.   We strive to give you quality time with your provider. You may need to reschedule your appointment if you arrive late (15 or more minutes).  Arriving late affects you and other patients whose appointments are after yours.  Also, if you miss three or more appointments without notifying the office, you may be dismissed from the clinic at the provider's discretion.      For prescription refill requests, have your pharmacy contact our office and allow 72 hours for refills to be completed.    Today you received Feraheme IV iron infusion.     BELOW ARE SYMPTOMS THAT SHOULD BE REPORTED IMMEDIATELY: *FEVER GREATER THAN 100.4 F (38 C) OR HIGHER *CHILLS OR SWEATING *NAUSEA AND VOMITING THAT IS NOT CONTROLLED WITH YOUR NAUSEA MEDICATION *UNUSUAL SHORTNESS OF BREATH *UNUSUAL BRUISING OR BLEEDING *URINARY PROBLEMS (pain or burning when urinating, or frequent urination) *BOWEL PROBLEMS (unusual diarrhea, constipation, pain near the anus) TENDERNESS IN MOUTH AND THROAT WITH OR WITHOUT PRESENCE OF ULCERS (sore throat, sores in mouth, or a toothache) UNUSUAL RASH, SWELLING OR PAIN  UNUSUAL VAGINAL DISCHARGE OR ITCHING   Items with * indicate a potential emergency and should be followed up as soon as possible or go to the Emergency Department if any problems should occur.  Please show the CHEMOTHERAPY ALERT CARD or IMMUNOTHERAPY ALERT CARD at check-in to the Emergency Department and triage nurse.  Should you have questions after your visit or need to cancel or reschedule your appointment, please contact [facility]  [phone]  and follow the prompts.  Office hours are 8:00 a.m. to 4:30 p.m. Monday - Friday. Please note that voicemails left after 4:00 p.m. may not be returned until the following business day.  We are closed weekends and major holidays. You have access to a nurse at all times for urgent questions. Please call the main number to the clinic [phone] and follow the prompts.  For any non-urgent questions, you may also contact your provider using MyChart. We now offer e-Visits for anyone 18 and older to request care online for non-urgent symptoms. For details visit [email or web address].   Also download the MyChart app! Go to the app store, search "MyChart", open the app, select , and log in with your MyChart username and password.  Due to Covid, a mask is required upon entering the hospital/clinic. If you do not have a mask, one will be given to you upon arrival. For doctor visits, patients may have 1 support person aged 18 or older with them. For treatment visits, patients cannot have anyone with them due to current Covid guidelines and our immunocompromised population.  

## 2021-03-02 NOTE — Progress Notes (Signed)
Pt presents today for Feraheme IV iron infusion per provider's order. Vital signs stable and pt voiced no new complaints at this time.  Feraheme IV iron given today per MD orders. Tolerated infusion without adverse affects. Vital signs stable. No complaints at this time. Discharged from clinic ambulatory with walker in stable condition. Alert and oriented x 3. F/U with [facility] as scheduled.

## 2021-03-03 ENCOUNTER — Telehealth: Payer: Self-pay | Admitting: Family Medicine

## 2021-03-03 NOTE — Telephone Encounter (Signed)
Appt is scheduled for 1/25 at 2:15pm

## 2021-03-03 NOTE — Telephone Encounter (Signed)
We received a form for surgical clearance for patient and she needs an appt with PCP before she can have cataract surgery. First available is April 20, 2021 with Dr. Lajuana Ripple. Surgical clearance form has been left at [facility].

## 2021-03-04 ENCOUNTER — Telehealth: Payer: Self-pay

## 2021-03-04 ENCOUNTER — Ambulatory Visit: Payer: Medicare Other | Admitting: Gastroenterology

## 2021-03-04 NOTE — Telephone Encounter (Signed)
ATC patient regarding surgical clearance form received from [facility]. Dr. Melvyn Novas has on form that patient needs a new appt with him to get clearance. No VM available.

## 2021-03-09 ENCOUNTER — Encounter (HOSPITAL_COMMUNITY): Payer: Self-pay

## 2021-03-09 ENCOUNTER — Inpatient Hospital Stay (HOSPITAL_COMMUNITY): Payer: Medicare Other | Attending: Hematology

## 2021-03-09 VITALS — BP 125/79 | HR 92 | Temp 98.1°F | Resp 18

## 2021-03-09 DIAGNOSIS — R079 Chest pain, unspecified: Secondary | ICD-10-CM | POA: Diagnosis present

## 2021-03-09 DIAGNOSIS — Z79891 Long term (current) use of opiate analgesic: Secondary | ICD-10-CM

## 2021-03-09 DIAGNOSIS — E785 Hyperlipidemia, unspecified: Secondary | ICD-10-CM | POA: Diagnosis present

## 2021-03-09 DIAGNOSIS — Z902 Acquired absence of lung [part of]: Secondary | ICD-10-CM

## 2021-03-09 DIAGNOSIS — Z90710 Acquired absence of both cervix and uterus: Secondary | ICD-10-CM

## 2021-03-09 DIAGNOSIS — D508 Other iron deficiency anemias: Secondary | ICD-10-CM

## 2021-03-09 DIAGNOSIS — Z833 Family history of diabetes mellitus: Secondary | ICD-10-CM

## 2021-03-09 DIAGNOSIS — I10 Essential (primary) hypertension: Secondary | ICD-10-CM | POA: Diagnosis present

## 2021-03-09 DIAGNOSIS — Z981 Arthrodesis status: Secondary | ICD-10-CM

## 2021-03-09 DIAGNOSIS — G894 Chronic pain syndrome: Secondary | ICD-10-CM | POA: Diagnosis present

## 2021-03-09 DIAGNOSIS — Z83438 Family history of other disorder of lipoprotein metabolism and other lipidemia: Secondary | ICD-10-CM

## 2021-03-09 DIAGNOSIS — Z7951 Long term (current) use of inhaled steroids: Secondary | ICD-10-CM

## 2021-03-09 DIAGNOSIS — J449 Chronic obstructive pulmonary disease, unspecified: Secondary | ICD-10-CM | POA: Diagnosis present

## 2021-03-09 DIAGNOSIS — K859 Acute pancreatitis without necrosis or infection, unspecified: Secondary | ICD-10-CM | POA: Diagnosis not present

## 2021-03-09 DIAGNOSIS — Z888 Allergy status to other drugs, medicaments and biological substances status: Secondary | ICD-10-CM

## 2021-03-09 DIAGNOSIS — F419 Anxiety disorder, unspecified: Secondary | ICD-10-CM | POA: Diagnosis present

## 2021-03-09 DIAGNOSIS — Z79899 Other long term (current) drug therapy: Secondary | ICD-10-CM

## 2021-03-09 DIAGNOSIS — K861 Other chronic pancreatitis: Secondary | ICD-10-CM | POA: Diagnosis not present

## 2021-03-09 DIAGNOSIS — E876 Hypokalemia: Secondary | ICD-10-CM | POA: Diagnosis present

## 2021-03-09 DIAGNOSIS — F17210 Nicotine dependence, cigarettes, uncomplicated: Secondary | ICD-10-CM | POA: Diagnosis present

## 2021-03-09 DIAGNOSIS — Z9981 Dependence on supplemental oxygen: Secondary | ICD-10-CM

## 2021-03-09 DIAGNOSIS — K2100 Gastro-esophageal reflux disease with esophagitis, without bleeding: Secondary | ICD-10-CM | POA: Diagnosis present

## 2021-03-09 DIAGNOSIS — Z809 Family history of malignant neoplasm, unspecified: Secondary | ICD-10-CM

## 2021-03-09 DIAGNOSIS — L89311 Pressure ulcer of right buttock, stage 1: Secondary | ICD-10-CM | POA: Diagnosis present

## 2021-03-09 DIAGNOSIS — Z85118 Personal history of other malignant neoplasm of bronchus and lung: Secondary | ICD-10-CM

## 2021-03-09 DIAGNOSIS — Z825 Family history of asthma and other chronic lower respiratory diseases: Secondary | ICD-10-CM

## 2021-03-09 DIAGNOSIS — R300 Dysuria: Secondary | ICD-10-CM | POA: Diagnosis present

## 2021-03-09 DIAGNOSIS — Z881 Allergy status to other antibiotic agents status: Secondary | ICD-10-CM

## 2021-03-09 DIAGNOSIS — Z20822 Contact with and (suspected) exposure to covid-19: Secondary | ICD-10-CM | POA: Diagnosis present

## 2021-03-09 DIAGNOSIS — Z8249 Family history of ischemic heart disease and other diseases of the circulatory system: Secondary | ICD-10-CM

## 2021-03-09 MED ORDER — SODIUM CHLORIDE 0.9 % IV SOLN
510.0000 mg | Freq: Once | INTRAVENOUS | Status: AC
  Administered 2021-03-09: 510 mg via INTRAVENOUS
  Filled 2021-03-09: qty 17

## 2021-03-09 MED ORDER — ACETAMINOPHEN 325 MG PO TABS
650.0000 mg | ORAL_TABLET | Freq: Once | ORAL | Status: AC
  Administered 2021-03-09: 650 mg via ORAL
  Filled 2021-03-09: qty 2

## 2021-03-09 MED ORDER — LORATADINE 10 MG PO TABS
10.0000 mg | ORAL_TABLET | Freq: Once | ORAL | Status: AC
  Administered 2021-03-09: 10 mg via ORAL
  Filled 2021-03-09: qty 1

## 2021-03-09 MED ORDER — SODIUM CHLORIDE 0.9 % IV SOLN: Freq: Once | INTRAVENOUS | Status: AC

## 2021-03-09 NOTE — Patient Instructions (Signed)
[facility]  Discharge Instructions: ?Thank you for choosing [facility] to provide your oncology and hematology care.  ?If you have a lab appointment with the Cancer Center, please come in thru the Main Entrance and check in at the main information desk. ? ?Wear comfortable clothing and clothing appropriate for easy access to any Portacath or PICC line.  ? ?We strive to give you quality time with your provider. You may need to reschedule your appointment if you arrive late (15 or more minutes).  Arriving late affects you and other patients whose appointments are after yours.  Also, if you miss three or more appointments without notifying the office, you may be dismissed from the clinic at the provider?s discretion.    ?  ?For prescription refill requests, have your pharmacy contact our office and allow 72 hours for refills to be completed.   ? ?Today you received the following Feraheme, return as scheduled. ?  ?To help prevent nausea and vomiting after your treatment, we encourage you to take your nausea medication as directed. ? ?BELOW ARE SYMPTOMS THAT SHOULD BE REPORTED IMMEDIATELY: ?*FEVER GREATER THAN 100.4 F (38 ?C) OR HIGHER ?*CHILLS OR SWEATING ?*NAUSEA AND VOMITING THAT IS NOT CONTROLLED WITH YOUR NAUSEA MEDICATION ?*UNUSUAL SHORTNESS OF BREATH ?*UNUSUAL BRUISING OR BLEEDING ?*URINARY PROBLEMS (pain or burning when urinating, or frequent urination) ?*BOWEL PROBLEMS (unusual diarrhea, constipation, pain near the anus) ?TENDERNESS IN MOUTH AND THROAT WITH OR WITHOUT PRESENCE OF ULCERS (sore throat, sores in mouth, or a toothache) ?UNUSUAL RASH, SWELLING OR PAIN  ?UNUSUAL VAGINAL DISCHARGE OR ITCHING  ? ?Items with * indicate a potential emergency and should be followed up as soon as possible or go to the Emergency Department if any problems should occur. ? ?Please show the CHEMOTHERAPY ALERT CARD or IMMUNOTHERAPY ALERT CARD at check-in to the Emergency Department and triage  nurse. ? ?Should you have questions after your visit or need to cancel or reschedule your appointment, please contact [facility] [phone]  and follow the prompts.  Office hours are 8:00 a.m. to 4:30 p.m. Monday - Friday. Please note that voicemails left after 4:00 p.m. may not be returned until the following business day.  We are closed weekends and major holidays. You have access to a nurse at all times for urgent questions. Please call the main number to the clinic [phone] and follow the prompts. ? ?For any non-urgent questions, you may also contact your provider using MyChart. We now offer e-Visits for anyone 18 and older to request care online for non-urgent symptoms. For details visit [email or web address]. ?  ?Also download the MyChart app! Go to the app store, search "MyChart", open the app, select [facility], and log in with your MyChart username and password. ? ?Due to Covid, a mask is required upon entering the hospital/clinic. If you do not have a mask, one will be given to you upon arrival. For doctor visits, patients may have 1 support person aged 18 or older with them. For treatment visits, patients cannot have anyone with them due to current Covid guidelines and our immunocompromised population.  ?

## 2021-03-09 NOTE — Progress Notes (Signed)
Patient tolerated iron infusion with no complaints voiced. Peripheral IV site clean and dry with good blood return noted before and after infusion. Band aid applied. Patient refused to stay full 30 minute wait time post iron infusion. VSS with discharge and left in satisfactory condition with no s/s of distress noted.

## 2021-03-11 ENCOUNTER — Emergency Department (HOSPITAL_COMMUNITY): Payer: Medicare Other

## 2021-03-11 ENCOUNTER — Other Ambulatory Visit: Payer: Self-pay

## 2021-03-11 ENCOUNTER — Ambulatory Visit (HOSPITAL_COMMUNITY): Payer: Medicare Other

## 2021-03-11 ENCOUNTER — Encounter (HOSPITAL_COMMUNITY): Payer: Self-pay

## 2021-03-11 ENCOUNTER — Inpatient Hospital Stay (HOSPITAL_COMMUNITY)
Admission: EM | Admit: 2021-03-11 | Discharge: 2021-03-14 | DRG: 440 | Disposition: A | Discharge status: Home / Self Care | Payer: Medicare Other | Attending: Family Medicine | Admitting: Family Medicine

## 2021-03-11 DIAGNOSIS — Z720 Tobacco use: Secondary | ICD-10-CM | POA: Diagnosis present

## 2021-03-11 DIAGNOSIS — G894 Chronic pain syndrome: Secondary | ICD-10-CM | POA: Diagnosis present

## 2021-03-11 DIAGNOSIS — D508 Other iron deficiency anemias: Secondary | ICD-10-CM

## 2021-03-11 DIAGNOSIS — L8990 Pressure ulcer of unspecified site, unspecified stage: Secondary | ICD-10-CM | POA: Diagnosis present

## 2021-03-11 DIAGNOSIS — R109 Unspecified abdominal pain: Secondary | ICD-10-CM | POA: Diagnosis present

## 2021-03-11 DIAGNOSIS — I10 Essential (primary) hypertension: Secondary | ICD-10-CM | POA: Diagnosis present

## 2021-03-11 DIAGNOSIS — K219 Gastro-esophageal reflux disease without esophagitis: Secondary | ICD-10-CM | POA: Diagnosis present

## 2021-03-11 DIAGNOSIS — F1190 Opioid use, unspecified, uncomplicated: Secondary | ICD-10-CM | POA: Diagnosis present

## 2021-03-11 DIAGNOSIS — K859 Acute pancreatitis without necrosis or infection, unspecified: Secondary | ICD-10-CM | POA: Diagnosis present

## 2021-03-11 DIAGNOSIS — R1010 Upper abdominal pain, unspecified: Secondary | ICD-10-CM

## 2021-03-11 LAB — CBC WITH DIFFERENTIAL/PLATELET
Abs Immature Granulocytes: 0.05 10*3/uL (ref 0.00–0.07)
Basophils Absolute: 0 10*3/uL (ref 0.0–0.1)
Basophils Relative: 1 %
Eosinophils Absolute: 0 10*3/uL (ref 0.0–0.5)
Eosinophils Relative: 1 %
HCT: 38.3 % (ref 36.0–46.0)
Hemoglobin: 11.7 g/dL — ABNORMAL LOW (ref 12.0–15.0)
Immature Granulocytes: 1 %
Lymphocytes Relative: 20 %
Lymphs Abs: 0.9 10*3/uL (ref 0.7–4.0)
MCH: 32.1 pg (ref 26.0–34.0)
MCHC: 30.5 g/dL (ref 30.0–36.0)
MCV: 104.9 fL — ABNORMAL HIGH (ref 80.0–100.0)
Monocytes Absolute: 0.4 10*3/uL (ref 0.1–1.0)
Monocytes Relative: 8 %
Neutro Abs: 3.3 10*3/uL (ref 1.7–7.7)
Neutrophils Relative %: 69 %
Platelets: 218 10*3/uL (ref 150–400)
RBC: 3.65 MIL/uL — ABNORMAL LOW (ref 3.87–5.11)
RDW: 16.5 % — ABNORMAL HIGH (ref 11.5–15.5)
WBC: 4.7 10*3/uL (ref 4.0–10.5)
nRBC: 0 % (ref 0.0–0.2)

## 2021-03-11 LAB — COMPREHENSIVE METABOLIC PANEL
ALT: 17 U/L (ref 0–44)
AST: 18 U/L (ref 15–41)
Albumin: 3.7 g/dL (ref 3.5–5.0)
Alkaline Phosphatase: 80 U/L (ref 38–126)
Anion gap: 11 (ref 5–15)
BUN: 7 mg/dL — ABNORMAL LOW (ref 8–23)
CO2: 25 mmol/L (ref 22–32)
Calcium: 9.2 mg/dL (ref 8.9–10.3)
Chloride: 102 mmol/L (ref 98–111)
Creatinine, Ser: 0.37 mg/dL — ABNORMAL LOW (ref 0.44–1.00)
GFR, Estimated: >60 mL/min (ref >60)
Glucose, Bld: 111 mg/dL — ABNORMAL HIGH (ref 70–99)
Potassium: 3.9 mmol/L (ref 3.5–5.1)
Sodium: 138 mmol/L (ref 135–145)
Total Bilirubin: 0.4 mg/dL (ref 0.3–1.2)
Total Protein: 6.9 g/dL (ref 6.5–8.1)

## 2021-03-11 LAB — TROPONIN I (HIGH SENSITIVITY): Troponin I (High Sensitivity): 13 ng/L (ref <18)

## 2021-03-11 LAB — LIPASE, BLOOD: Lipase: 21 U/L (ref 11–51)

## 2021-03-11 MED ORDER — SODIUM CHLORIDE 0.9 % IV BOLUS
1000.0000 mL | Freq: Once | INTRAVENOUS | Status: AC
  Administered 2021-03-11: 1000 mL via INTRAVENOUS

## 2021-03-11 MED ORDER — PROMETHAZINE HCL 25 MG/ML IJ SOLN
INTRAMUSCULAR | Status: AC
  Filled 2021-03-11: qty 1

## 2021-03-11 MED ORDER — SODIUM CHLORIDE 0.9 % IV SOLN
12.5000 mg | Freq: Once | INTRAVENOUS | Status: AC
  Administered 2021-03-11: 12.5 mg via INTRAVENOUS
  Filled 2021-03-11: qty 0.5

## 2021-03-11 MED ORDER — IOHEXOL 300 MG/ML  SOLN
100.0000 mL | Freq: Once | INTRAMUSCULAR | Status: AC | PRN
  Administered 2021-03-11: 100 mL via INTRAVENOUS

## 2021-03-11 MED ORDER — HYDROMORPHONE HCL 1 MG/ML IJ SOLN
0.5000 mg | Freq: Once | INTRAMUSCULAR | Status: AC
  Administered 2021-03-11: 0.5 mg via INTRAVENOUS
  Filled 2021-03-11: qty 1

## 2021-03-11 MED ORDER — PANTOPRAZOLE SODIUM 40 MG IV SOLR
40.0000 mg | Freq: Once | INTRAVENOUS | Status: AC
Start: 2021-03-11 — End: 2021-03-11
  Administered 2021-03-11: 40 mg via INTRAVENOUS
  Filled 2021-03-11: qty 40

## 2021-03-11 MED ORDER — MORPHINE SULFATE (PF) 2 MG/ML IV SOLN
2.0000 mg | Freq: Once | INTRAVENOUS | Status: AC
  Administered 2021-03-11: 2 mg via INTRAVENOUS
  Filled 2021-03-11: qty 1

## 2021-03-11 NOTE — ED Provider Notes (Signed)
[facility] EMERGENCY DEPARTMENT Provider Note   CSN: [id number] Arrival date & time: 03/11/21  2044     History  Chief Complaint  Patient presents with   Chest Pain    Jasmine [name] is a 66 y.o. female.  Patient complains of epigastric pain and persistent vomiting.  Patient has a history of COPD chronic pain pancreatitis lung cancer.  She was admitted last month with abdominal pain and vomiting.  CT angio of the chest was unremarkable for PE and last admission and ultrasound of the abdomen did not show any cholecystitis but she had dilated common bile duct which was chronic  The history is provided by the patient and medical records.  Chest Pain Pain location:  Epigastric Pain quality: aching   Pain radiates to:  Does not radiate Pain severity:  Moderate Onset quality:  Sudden Timing:  Constant Progression:  Worsening Chronicity:  Recurrent Context: not breathing   Relieved by:  Nothing Worsened by:  Nothing Associated symptoms: abdominal pain   Associated symptoms: no back pain, no cough, no fatigue and no headache       Home Medications Prior to Admission medications   Medication Sig Start Date End Date Taking? Authorizing Provider  albuterol (VENTOLIN HFA) 108 (90 Base) MCG/ACT inhaler Inhale into the lungs every 6 (six) hours as needed for wheezing or shortness of breath.    [provider]  atorvastatin (LIPITOR) 40 MG tablet Take 1 tablet (40 mg total) by mouth daily. 07/09/20   Ronnie Doss M, DO  budesonide-formoterol (SYMBICORT) 160-4.5 MCG/ACT inhaler USE 2 INHALATIONS TWICE A DAY 01/17/21   Ronnie Doss M, DO  Cholecalciferol 125 MCG (5000 UT) capsule Take 5,000 Units by mouth daily.    [provider]  dextromethorphan-guaiFENesin (MUCINEX DM) 30-600 MG 12hr tablet Take 1 tablet by mouth 2 (two) times daily.    [provider]  DULoxetine (CYMBALTA) 30 MG capsule Take 3 capsules (90 mg total) by mouth daily. 07/09/20    Janora Norlander, DO  ipratropium-albuterol (DUONEB) 0.5-2.5 (3) MG/3ML SOLN Inhale 3 mLs into the lungs 4 (four) times daily as needed (for shortness of breath/wheezing). *May use as needed 02/21/16   [provider]  Iron Polysacch Cmplx-B12-FA (POLY-IRON 150 FORTE) 150-0.025-1 MG CAPS Take 1 capsule by mouth 2 (two) times daily. 05/27/20   Derek Jack, MD  L-Methylfolate-Algae-B12-B6 [facility]) 3-90.314-2-35 MG CAPS TAKE 1 TABLET BY MOUTH TWO TIMES A DAY Patient taking differently: Take 1 tablet by mouth daily. 09/10/19   Janora Norlander, DO  magnesium oxide (MAG-OX) 400 (241.3 Mg) MG tablet Take 400 mg by mouth 2 (two) times daily. 02/07/20   [provider]  nicotine (NICODERM CQ) 14 mg/24hr patch Place 1 patch (14 mg total) onto the skin daily. 06/23/20   Janora Norlander, DO  Nutritional Supplements (ENSURE HIGH PROTEIN) LIQD Drink 1 can 3 times daily. 08/30/20   Janora Norlander, DO  omeprazole (PRILOSEC) 40 MG capsule Take one capsule once to twice daily before a meal. 01/18/21   Mahala Menghini, PA-C  ondansetron (ZOFRAN ODT) 4 MG disintegrating tablet Take 1 tablet (4 mg total) by mouth every 8 (eight) hours as needed for nausea or vomiting. 09/12/20   Maudie Flakes, MD  oxybutynin (DITROPAN XL) 5 MG 24 hr tablet Take 1 tablet (5 mg total) by mouth at bedtime. 03/01/21   Janora Norlander, DO  oxyCODONE-acetaminophen (PERCOCET) 10-325 MG tablet Take 1 tablet by mouth 5 (five) times  daily. 08/27/20   [provider]  OXYCONTIN 15 MG 12 hr tablet Take 15 mg by mouth every 12 (twelve) hours. 08/28/20   [provider]  OXYGEN Inhale 3 L into the lungs See admin instructions. continuous    [provider]  SPIRIVA RESPIMAT 2.5 MCG/ACT AERS USE 2 INHALATIONS DAILY Patient taking differently: Inhale 2 puffs into the lungs daily. 07/13/20   Tanda Rockers, MD  tizanidine (ZANAFLEX) 6 MG capsule Take 6 mg by mouth 3 (three) times daily.  08/27/20   [provider]  traZODone (DESYREL) 150 MG tablet TAKE 1 TO 2 TABLETS AT BEDTIME Patient taking differently: Take 150-300 mg by mouth at bedtime. 02/02/20   Janora Norlander, DO      Allergies    Lyrica [pregabalin], Darvon [propoxyphene], Gabapentin, Augmentin [amoxicillin-pot clavulanate], Erythromycin, and Vibramycin [doxycycline calcium]    Review of Systems   Review of Systems  Constitutional:  Negative for appetite change and fatigue.  HENT:  Negative for congestion, ear discharge and sinus pressure.   Eyes:  Negative for discharge.  Respiratory:  Negative for cough.   Cardiovascular:  Positive for chest pain.  Gastrointestinal:  Positive for abdominal pain. Negative for diarrhea.  Genitourinary:  Negative for frequency and hematuria.  Musculoskeletal:  Negative for back pain.  Skin:  Negative for rash.  Neurological:  Negative for seizures and headaches.  Psychiatric/Behavioral:  Negative for hallucinations.    Physical Exam Updated Vital Signs BP (!) 188/107    Pulse (!) 110    Resp 18    Ht 5\' 3"  (1.6 m)    Wt 46.3 kg    SpO2 100%    BMI 18.09 kg/m  Physical Exam Vitals and nursing note reviewed.  Constitutional:      Appearance: She is well-developed.  HENT:     Head: Normocephalic.     Nose: Nose normal.  Eyes:     General: No scleral icterus.    Conjunctiva/sclera: Conjunctivae normal.  Neck:     Thyroid: No thyromegaly.  Cardiovascular:     Rate and Rhythm: Normal rate and regular rhythm.     Heart sounds: No murmur heard.   No friction rub. No gallop.  Pulmonary:     Breath sounds: No stridor. No wheezing or rales.  Chest:     Chest wall: No tenderness.  Abdominal:     General: There is no distension.     Tenderness: There is abdominal tenderness. There is no rebound.  Musculoskeletal:        General: Normal range of motion.     Cervical back: Neck supple.  Lymphadenopathy:     Cervical: No cervical adenopathy.  Skin:     Findings: No erythema or rash.  Neurological:     Mental Status: She is alert and oriented to person, place, and time.     Motor: No abnormal muscle tone.     Coordination: Coordination normal.  Psychiatric:        Behavior: Behavior normal.    ED Results / Procedures / Treatments   Labs (all labs ordered are listed, but only abnormal results are displayed) Labs Reviewed  CBC WITH DIFFERENTIAL/PLATELET - Abnormal; Notable for the following components:      Result Value   RBC 3.65 (*)    Hemoglobin 11.7 (*)    MCV 104.9 (*)    RDW 16.5 (*)    All other components within normal limits  COMPREHENSIVE METABOLIC PANEL - Abnormal; Notable  for the following components:   Glucose, Bld 111 (*)    BUN 7 (*)    Creatinine, Ser 0.37 (*)    All other components within normal limits  LIPASE, BLOOD  TROPONIN I (HIGH SENSITIVITY)  TROPONIN I (HIGH SENSITIVITY)    EKG None  Radiology DG Chest Port 1 View  Result Date: 03/11/2021 CLINICAL DATA:  Chest pain with nausea vomit EXAM: PORTABLE CHEST 1 VIEW COMPARISON:  02/12/2021 FINDINGS: Small right pleural effusion or pleural thickening. Stable cardiomediastinal silhouette. Postsurgical changes in the right suprahilar lung. Emphysema with mild chronic bronchitic changes. IMPRESSION: No active disease.  Emphysema and postsurgical changes on the right. Electronically Signed   By: Donavan Foil M.D.   On: 03/11/2021 21:32    Procedures Procedures    Medications Ordered in ED Medications  promethazine (PHENERGAN) 12.5 mg in sodium chloride 0.9 % 50 mL IVPB (has no administration in time range)  sodium chloride 0.9 % bolus 1,000 mL (1,000 mLs Intravenous New Bag/Given 03/11/21 2116)  sodium chloride 0.9 % bolus 1,000 mL (1,000 mLs Intravenous New Bag/Given 03/11/21 2225)  pantoprazole (PROTONIX) injection 40 mg (40 mg Intravenous Given 03/11/21 2222)  iohexol (OMNIPAQUE) 300 MG/ML solution 100 mL (100 mLs Intravenous Contrast Given 03/11/21 2301)   morphine 2 MG/ML injection 2 mg (2 mg Intravenous Given 03/11/21 2243)   CRITICAL CARE Performed by: Milton Ferguson Total critical care time: 40 minutes Critical care time was exclusive of separately billable procedures and treating other patients. Critical care was necessary to treat or prevent imminent or life-threatening deterioration. Critical care was time spent personally by me on the following activities: development of treatment plan with patient and/or surrogate as well as nursing, discussions with consultants, evaluation of patient's response to treatment, examination of patient, obtaining history from patient or surrogate, ordering and performing treatments and interventions, ordering and review of laboratory studies, ordering and review of radiographic studies, pulse oximetry and re-evaluation of patient's condition.  ED Course/ Medical Decision Making/ A&P                           Medical Decision Making  Patient with persistent vomiting and epigastric pain CT CAT scan pending.  She will be admitted to medicine.  Addendum.  CT scan shows calcifications at the head of the pancreas This patient presents to the ED for concern of abdominal cramping, this involves an extensive number of treatment options, and is a complaint that carries with it a high risk of complications and morbidity.  The differential diagnosis includes pancreatitis, gastritis, cholecystitis,   Co morbidities that complicate the patient evaluation  History of pancreatitis and COPD   Additional history obtained:  Additional history obtained from patient External records from outside source obtained and reviewed including hospital records   Lab Tests:  I Ordered, and personally interpreted labs.  The pertinent results include: Mild anemia with hemoglobin 11.7   Imaging Studies ordered:  I ordered imaging studies including CT abdomen I independently visualized and interpreted imaging which showed  calcifications in the pancreas I agree with the radiologist interpretation   Cardiac Monitoring:  The patient was maintained on a cardiac monitor.  I personally viewed and interpreted the cardiac monitored which showed an underlying rhythm of: Sinus rhythm   Medicines ordered and prescription drug management:  I ordered medication including Phenergan, Protonix, Dilaudid, Reevaluation of the patient after these medicines showed that the patient improved I have reviewed the patients home  medicines and have made adjustments as needed   Test Considered:  MRI of the abdomen   Critical Interventions:  Fluids and pain medicine   Consultations Obtained:  I requested consultation with the hospitalist,  and discussed lab and imaging findings as well as pertinent plan - they recommend: Admission   Problem List / ED Course:  Abdominal pain, pancreatitis   Reevaluation:  After the interventions noted above, I reevaluated the patient and found that they have :improved   Social Determinants of Health:  Lives alone   Dispostion:  After consideration of the diagnostic results and the patients response to treatment, I feel that the patent would benefit from admission.         Final Clinical Impression(s) / ED Diagnoses Final diagnoses:  None    Rx / DC Orders ED Discharge Orders     None         Milton Ferguson, MD 03/13/21 1042

## 2021-03-11 NOTE — ED Triage Notes (Signed)
Rcems from home cc of CP with n/v for 2 days.  Did not want aspirin and ntg because she does not think its cardiac but did have 4mg  zofran. 4l [place] with ems . 3l at home.  20g r ac

## 2021-03-12 DIAGNOSIS — Z8249 Family history of ischemic heart disease and other diseases of the circulatory system: Secondary | ICD-10-CM | POA: Diagnosis not present

## 2021-03-12 DIAGNOSIS — F1190 Opioid use, unspecified, uncomplicated: Secondary | ICD-10-CM | POA: Diagnosis not present

## 2021-03-12 DIAGNOSIS — K861 Other chronic pancreatitis: Secondary | ICD-10-CM | POA: Diagnosis present

## 2021-03-12 DIAGNOSIS — Z90710 Acquired absence of both cervix and uterus: Secondary | ICD-10-CM | POA: Diagnosis not present

## 2021-03-12 DIAGNOSIS — R079 Chest pain, unspecified: Secondary | ICD-10-CM | POA: Diagnosis present

## 2021-03-12 DIAGNOSIS — G894 Chronic pain syndrome: Secondary | ICD-10-CM | POA: Diagnosis present

## 2021-03-12 DIAGNOSIS — R109 Unspecified abdominal pain: Secondary | ICD-10-CM

## 2021-03-12 DIAGNOSIS — Z20822 Contact with and (suspected) exposure to covid-19: Secondary | ICD-10-CM | POA: Diagnosis present

## 2021-03-12 DIAGNOSIS — Z825 Family history of asthma and other chronic lower respiratory diseases: Secondary | ICD-10-CM | POA: Diagnosis not present

## 2021-03-12 DIAGNOSIS — Z981 Arthrodesis status: Secondary | ICD-10-CM | POA: Diagnosis not present

## 2021-03-12 DIAGNOSIS — L89311 Pressure ulcer of right buttock, stage 1: Secondary | ICD-10-CM | POA: Diagnosis present

## 2021-03-12 DIAGNOSIS — F17210 Nicotine dependence, cigarettes, uncomplicated: Secondary | ICD-10-CM | POA: Diagnosis present

## 2021-03-12 DIAGNOSIS — E876 Hypokalemia: Secondary | ICD-10-CM | POA: Diagnosis present

## 2021-03-12 DIAGNOSIS — K859 Acute pancreatitis without necrosis or infection, unspecified: Secondary | ICD-10-CM | POA: Diagnosis present

## 2021-03-12 DIAGNOSIS — E785 Hyperlipidemia, unspecified: Secondary | ICD-10-CM | POA: Diagnosis present

## 2021-03-12 DIAGNOSIS — Z83438 Family history of other disorder of lipoprotein metabolism and other lipidemia: Secondary | ICD-10-CM | POA: Diagnosis not present

## 2021-03-12 DIAGNOSIS — I10 Essential (primary) hypertension: Secondary | ICD-10-CM | POA: Diagnosis present

## 2021-03-12 DIAGNOSIS — K2100 Gastro-esophageal reflux disease with esophagitis, without bleeding: Secondary | ICD-10-CM | POA: Diagnosis present

## 2021-03-12 DIAGNOSIS — Z809 Family history of malignant neoplasm, unspecified: Secondary | ICD-10-CM | POA: Diagnosis not present

## 2021-03-12 DIAGNOSIS — Z85118 Personal history of other malignant neoplasm of bronchus and lung: Secondary | ICD-10-CM | POA: Diagnosis not present

## 2021-03-12 DIAGNOSIS — J449 Chronic obstructive pulmonary disease, unspecified: Secondary | ICD-10-CM | POA: Diagnosis present

## 2021-03-12 DIAGNOSIS — Z9981 Dependence on supplemental oxygen: Secondary | ICD-10-CM | POA: Diagnosis not present

## 2021-03-12 DIAGNOSIS — Z833 Family history of diabetes mellitus: Secondary | ICD-10-CM | POA: Diagnosis not present

## 2021-03-12 DIAGNOSIS — L8990 Pressure ulcer of unspecified site, unspecified stage: Secondary | ICD-10-CM | POA: Diagnosis present

## 2021-03-12 DIAGNOSIS — R300 Dysuria: Secondary | ICD-10-CM | POA: Diagnosis present

## 2021-03-12 DIAGNOSIS — Z7951 Long term (current) use of inhaled steroids: Secondary | ICD-10-CM | POA: Diagnosis not present

## 2021-03-12 LAB — COMPREHENSIVE METABOLIC PANEL
ALT: 17 U/L (ref 0–44)
AST: 18 U/L (ref 15–41)
Albumin: 3.5 g/dL (ref 3.5–5.0)
Alkaline Phosphatase: 78 U/L (ref 38–126)
Anion gap: 12 (ref 5–15)
BUN: 6 mg/dL — ABNORMAL LOW (ref 8–23)
CO2: 30 mmol/L (ref 22–32)
Calcium: 8.9 mg/dL (ref 8.9–10.3)
Chloride: 96 mmol/L — ABNORMAL LOW (ref 98–111)
Creatinine, Ser: 0.44 mg/dL (ref 0.44–1.00)
GFR, Estimated: >60 mL/min (ref >60)
Glucose, Bld: 122 mg/dL — ABNORMAL HIGH (ref 70–99)
Potassium: 3.7 mmol/L (ref 3.5–5.1)
Sodium: 138 mmol/L (ref 135–145)
Total Bilirubin: 1 mg/dL (ref 0.3–1.2)
Total Protein: 6.6 g/dL (ref 6.5–8.1)

## 2021-03-12 LAB — URINALYSIS, ROUTINE W REFLEX MICROSCOPIC
Bacteria, UA: NONE SEEN
Bilirubin Urine: NEGATIVE
Glucose, UA: NEGATIVE mg/dL
Hgb urine dipstick: NEGATIVE
Ketones, ur: 20 mg/dL — AB
Leukocytes,Ua: NEGATIVE
Nitrite: NEGATIVE
Protein, ur: 30 mg/dL — AB
Specific Gravity, Urine: 1.015 (ref 1.005–1.030)
pH: 7 (ref 5.0–8.0)

## 2021-03-12 LAB — CBC WITH DIFFERENTIAL/PLATELET
Abs Immature Granulocytes: 0.03 10*3/uL (ref 0.00–0.07)
Basophils Absolute: 0 10*3/uL (ref 0.0–0.1)
Basophils Relative: 1 %
Eosinophils Absolute: 0 10*3/uL (ref 0.0–0.5)
Eosinophils Relative: 0 %
HCT: 40.3 % (ref 36.0–46.0)
Hemoglobin: 12 g/dL (ref 12.0–15.0)
Immature Granulocytes: 1 %
Lymphocytes Relative: 21 %
Lymphs Abs: 1 10*3/uL (ref 0.7–4.0)
MCH: 30.9 pg (ref 26.0–34.0)
MCHC: 29.8 g/dL — ABNORMAL LOW (ref 30.0–36.0)
MCV: 103.9 fL — ABNORMAL HIGH (ref 80.0–100.0)
Monocytes Absolute: 0.5 10*3/uL (ref 0.1–1.0)
Monocytes Relative: 10 %
Neutro Abs: 3.2 10*3/uL (ref 1.7–7.7)
Neutrophils Relative %: 67 %
Platelets: 224 10*3/uL (ref 150–400)
RBC: 3.88 MIL/uL (ref 3.87–5.11)
RDW: 16.3 % — ABNORMAL HIGH (ref 11.5–15.5)
WBC: 4.7 10*3/uL (ref 4.0–10.5)
nRBC: 0 % (ref 0.0–0.2)

## 2021-03-12 LAB — RESP PANEL BY RT-PCR (FLU A&B, COVID) ARPGX2
Influenza A by PCR: NEGATIVE
Influenza B by PCR: NEGATIVE
SARS Coronavirus 2 by RT PCR: NEGATIVE

## 2021-03-12 LAB — MAGNESIUM: Magnesium: 1.6 mg/dL — ABNORMAL LOW (ref 1.7–2.4)

## 2021-03-12 LAB — TROPONIN I (HIGH SENSITIVITY): Troponin I (High Sensitivity): 12 ng/L (ref <18)

## 2021-03-12 MED ORDER — OXYCODONE-ACETAMINOPHEN 5-325 MG PO TABS
1.0000 | ORAL_TABLET | Freq: Every day | ORAL | Status: DC
  Administered 2021-03-12 – 2021-03-13 (×7): 1 via ORAL
  Filled 2021-03-12 (×7): qty 1

## 2021-03-12 MED ORDER — ACETAMINOPHEN 325 MG PO TABS
650.0000 mg | ORAL_TABLET | Freq: Four times a day (QID) | ORAL | Status: DC | PRN
  Administered 2021-03-12: 650 mg via ORAL
  Filled 2021-03-12: qty 2

## 2021-03-12 MED ORDER — ACETAMINOPHEN 650 MG RE SUPP: 650.0000 mg | Freq: Four times a day (QID) | RECTAL | Status: DC | PRN

## 2021-03-12 MED ORDER — DULOXETINE HCL 60 MG PO CPEP
90.0000 mg | ORAL_CAPSULE | Freq: Every day | ORAL | Status: DC
  Administered 2021-03-12 – 2021-03-14 (×3): 90 mg via ORAL
  Filled 2021-03-12 (×3): qty 1

## 2021-03-12 MED ORDER — SODIUM CHLORIDE 0.9 % IV SOLN
12.5000 mg | Freq: Four times a day (QID) | INTRAVENOUS | Status: DC | PRN
  Administered 2021-03-12 (×2): 12.5 mg via INTRAVENOUS
  Filled 2021-03-12: qty 0.5

## 2021-03-12 MED ORDER — ONDANSETRON HCL 4 MG/2ML IJ SOLN
4.0000 mg | Freq: Four times a day (QID) | INTRAMUSCULAR | Status: DC | PRN
  Administered 2021-03-12 – 2021-03-13 (×2): 4 mg via INTRAVENOUS
  Filled 2021-03-12 (×2): qty 2

## 2021-03-12 MED ORDER — OXYCODONE HCL 5 MG PO TABS
5.0000 mg | ORAL_TABLET | ORAL | Status: DC | PRN
  Administered 2021-03-12: 5 mg via ORAL
  Filled 2021-03-12: qty 1

## 2021-03-12 MED ORDER — MAGNESIUM SULFATE 2 GM/50ML IV SOLN
2.0000 g | Freq: Once | INTRAVENOUS | Status: AC
  Administered 2021-03-12: 2 g via INTRAVENOUS
  Filled 2021-03-12: qty 50

## 2021-03-12 MED ORDER — HEPARIN SODIUM (PORCINE) 5000 UNIT/ML IJ SOLN: 5000.0000 [IU] | Freq: Three times a day (TID) | INTRAMUSCULAR | Status: DC

## 2021-03-12 MED ORDER — PANTOPRAZOLE SODIUM 40 MG PO TBEC
40.0000 mg | DELAYED_RELEASE_TABLET | Freq: Every day | ORAL | Status: DC
  Administered 2021-03-12 – 2021-03-14 (×3): 40 mg via ORAL
  Filled 2021-03-12 (×3): qty 1

## 2021-03-12 MED ORDER — HEPARIN SODIUM (PORCINE) 5000 UNIT/ML IJ SOLN
5000.0000 [IU] | Freq: Three times a day (TID) | INTRAMUSCULAR | Status: DC
  Administered 2021-03-12 – 2021-03-14 (×7): 5000 [IU] via SUBCUTANEOUS
  Filled 2021-03-12 (×7): qty 1

## 2021-03-12 MED ORDER — MORPHINE SULFATE (PF) 2 MG/ML IV SOLN
2.0000 mg | INTRAVENOUS | Status: DC | PRN
  Administered 2021-03-12 – 2021-03-13 (×11): 2 mg via INTRAVENOUS
  Filled 2021-03-12 (×11): qty 1

## 2021-03-12 MED ORDER — SUCRALFATE 1 GM/10ML PO SUSP
1.0000 g | Freq: Three times a day (TID) | ORAL | Status: DC
  Administered 2021-03-12 – 2021-03-14 (×9): 1 g via ORAL
  Filled 2021-03-12 (×9): qty 10

## 2021-03-12 MED ORDER — UMECLIDINIUM BROMIDE 62.5 MCG/ACT IN AEPB
1.0000 | INHALATION_SPRAY | Freq: Every day | RESPIRATORY_TRACT | Status: DC
  Administered 2021-03-12 – 2021-03-14 (×3): 1 via RESPIRATORY_TRACT
  Filled 2021-03-12: qty 7

## 2021-03-12 MED ORDER — PROMETHAZINE HCL 25 MG/ML IJ SOLN
INTRAMUSCULAR | Status: AC
  Filled 2021-03-12: qty 1

## 2021-03-12 MED ORDER — OXYCODONE HCL 5 MG PO TABS
5.0000 mg | ORAL_TABLET | Freq: Every day | ORAL | Status: DC
  Administered 2021-03-12 – 2021-03-13 (×7): 5 mg via ORAL
  Filled 2021-03-12 (×7): qty 1

## 2021-03-12 MED ORDER — IPRATROPIUM-ALBUTEROL 0.5-2.5 (3) MG/3ML IN SOLN: 3.0000 mL | Freq: Four times a day (QID) | RESPIRATORY_TRACT | Status: DC | PRN

## 2021-03-12 MED ORDER — ALBUTEROL SULFATE HFA 108 (90 BASE) MCG/ACT IN AERS: 1.0000 | INHALATION_SPRAY | Freq: Four times a day (QID) | RESPIRATORY_TRACT | Status: DC | PRN

## 2021-03-12 MED ORDER — OXYCODONE-ACETAMINOPHEN 10-325 MG PO TABS: 1.0000 | ORAL_TABLET | Freq: Every day | ORAL | Status: DC

## 2021-03-12 MED ORDER — SODIUM CHLORIDE 0.9 % IV SOLN: INTRAVENOUS | Status: DC

## 2021-03-12 MED ORDER — MOMETASONE FURO-FORMOTEROL FUM 200-5 MCG/ACT IN AERO
2.0000 | INHALATION_SPRAY | Freq: Two times a day (BID) | RESPIRATORY_TRACT | Status: DC
  Administered 2021-03-12 – 2021-03-14 (×5): 2 via RESPIRATORY_TRACT
  Filled 2021-03-12: qty 8.8

## 2021-03-12 MED ORDER — METOPROLOL TARTRATE 5 MG/5ML IV SOLN
5.0000 mg | Freq: Once | INTRAVENOUS | Status: AC
  Administered 2021-03-12: 5 mg via INTRAVENOUS
  Filled 2021-03-12: qty 5

## 2021-03-12 MED ORDER — LORAZEPAM 2 MG/ML IJ SOLN
1.0000 mg | Freq: Once | INTRAMUSCULAR | Status: AC
  Administered 2021-03-12: 1 mg via INTRAVENOUS
  Filled 2021-03-12: qty 1

## 2021-03-12 MED ORDER — OXYBUTYNIN CHLORIDE ER 5 MG PO TB24
5.0000 mg | ORAL_TABLET | Freq: Every day | ORAL | Status: DC
  Administered 2021-03-12 – 2021-03-13 (×3): 5 mg via ORAL
  Filled 2021-03-12 (×3): qty 1

## 2021-03-12 MED ORDER — NICOTINE 14 MG/24HR TD PT24
14.0000 mg | MEDICATED_PATCH | Freq: Every day | TRANSDERMAL | Status: DC
  Administered 2021-03-12 – 2021-03-14 (×3): 14 mg via TRANSDERMAL
  Filled 2021-03-12 (×3): qty 1

## 2021-03-12 MED ORDER — TRAZODONE HCL 50 MG PO TABS
50.0000 mg | ORAL_TABLET | Freq: Every day | ORAL | Status: DC
  Administered 2021-03-12 – 2021-03-13 (×2): 50 mg via ORAL
  Filled 2021-03-12 (×2): qty 1

## 2021-03-12 MED ORDER — ATORVASTATIN CALCIUM 40 MG PO TABS
40.0000 mg | ORAL_TABLET | Freq: Every day | ORAL | Status: DC
Start: 2021-03-12 — End: 2021-03-14
  Administered 2021-03-12 – 2021-03-14 (×3): 40 mg via ORAL
  Filled 2021-03-12 (×3): qty 1

## 2021-03-12 MED ORDER — TIOTROPIUM BROMIDE MONOHYDRATE 2.5 MCG/ACT IN AERS: 2.0000 | INHALATION_SPRAY | Freq: Every day | RESPIRATORY_TRACT | Status: DC

## 2021-03-12 MED ORDER — ONDANSETRON HCL 4 MG PO TABS
4.0000 mg | ORAL_TABLET | Freq: Four times a day (QID) | ORAL | Status: DC | PRN
  Administered 2021-03-13: 4 mg via ORAL
  Filled 2021-03-12: qty 1

## 2021-03-12 NOTE — Progress Notes (Signed)
PROGRESS NOTE    Patient: Jasmine [name]                            PCP: Janora Norlander, DO                    DOB: [birth year]            DOA: 03/11/2021 [id number]             DOS: 03/12/2021, 10:49 AM   LOS: 0 days   Date of Service: The patient was seen and examined on 03/12/2021  Subjective:   The patient was seen and examined this morning. Hemodynamically stable Still complaining of : Chest pain with belching, Stating she still feels nauseous unable to tolerate any p.o.  Brief Narrative:   Jasmine [name]  is a 66 y.o. female, with history of anemia, anxiety, chronic back pain, tobacco use disorder, COPD, history of lung cancer and lobectomy, GERD, chronic pancreatitis, seizures, presents to ED with a chief complaint of intractable abdominal pain and vomiting.  Patient reports that she had nausea, vomiting, epigastric pain, upper back pain, shoulder pain since yesterday.  In that time she has had 12-15 episodes of emesis. Denied hematemesis, poor appetite unable to tolerate p.o. liquids or solids.,  Denies any diarrhea or constipation, reporting chronic dysuria..Denies of having tearing like sensation.  Patient reports this feels very similar to her previous admission.    Last admission was also due to intractable nausea vomiting, was treated with antiemetics IV fluids, Protonix, IV analgesics..  No history of drug abuse including marijuana. Per patient at time she was suggested she has acute on chronic pancreatitis.     Patient is a current smoker and request nicotine patch.  She does not drink alcohol, does not use illicit drugs, and is vaccinated for COVID.  Patient is full code.    ED Stable vital signs mild tachycardia, CBC, BMP within normal limits  Lipase 21.   CT abdomen pelvis showed chronic pancreatitis with some ductal dilatation that was present previously.   Chest x-ray showed no active disease.   EKG showed a heart rate of 112, sinus tachycardia, QTC 416 with  left atrial enlargement.   Patient was given Dilaudid, morphine, Protonix, Phenergan and 2 L bolus.   Subsequently was admitted for intractable nausea vomiting, pain, dehydration unable to tolerate p.o.     Assessment & Plan:   Principal Problem:   Intractable abdominal pain Active Problems:   HTN (hypertension)   Tobacco abuse   GERD (gastroesophageal reflux disease)   Pressure injury of skin   Intractable nausea vomiting -With history of chronic pancreatitis, lipase within normal limits, LFTs within normal limits -CT abdomen pelvis: No acute finding including, chronic inflammation of the head of the pancreas. -Continue IV fluids, status post 2 L bolus in ED, -We will continue antiemetics -Initiating as needed Zofran, Phenergan -N.p.o., initiating clear liquid diet and advance as tolerated   Chest pain -Noncardiac -Describe the pain midsternal with radiation to the back exacerbated by belching, vomiting  -Intermittent with nausea vomiting -No indication of her symptoms being cardiac in nature -Likely esophagitis -Initiating scheduled Carafate, as needed Maalox -No changes in EKG, troponin negative, 13, 12 respectively  Hypomagnesemia  -Magnesium 1.6, repleted  hypertension -Stable continue Norvasc -As needed IV hydralazine  COPD -Stable, not O2 dependent at baseline, on room air satting 97% -Continue Symbicort, DuoNeb bronchodilators as needed  Hyperlipidemia -Continue statins  GERD -Continue PPI   Pressure Injury 03/12/21 Buttocks Right;Lower Stage 1 -  Intact skin with non-blanchable redness of a localized area usually over a bony prominence. Redness, nonblanchable, intact skin (Active)  03/12/21 0230  Location: Buttocks  Location Orientation: Right;Lower  Staging: Stage 1 -  Intact skin with non-blanchable redness of a localized area usually over a bony prominence.  Wound Description (Comments): Redness, nonblanchable, intact skin  Present on  Admission: Yes   Cultures; None   Antimicrobials: None    Consultants: None   ------------------------------------------------------------------------------------------------------------------------------------------------  DVT prophylaxis:  SCD/Compression stockings and Heparin SQ Code Status:   Code Status: Full Code  Family Communication: No family member present at bedside- attempt will be made to update daily The above findings and plan of care has been discussed with patient (and family)  in detail,  they expressed understanding and agreement of above. -Advance care planning has been discussed.   Admission status:   Status is: Observation  The patient remains OBS appropriate and will d/c before 2 midnights.     Level of care: Telemetry   Procedures:   No admission procedures for hospital encounter.    Antimicrobials:  Anti-infectives (From admission, onward)    None        Medication:   atorvastatin  40 mg Oral Daily   DULoxetine  90 mg Oral Daily   heparin  5,000 Units Subcutaneous Q8H   mometasone-formoterol  2 puff Inhalation BID   nicotine  14 mg Transdermal Daily   oxybutynin  5 mg Oral QHS   oxyCODONE-acetaminophen  1 tablet Oral 5 X Daily   And   oxyCODONE  5 mg Oral 5 X Daily   pantoprazole  40 mg Oral Daily   sucralfate  1 g Oral TID WC & HS   umeclidinium bromide  1 puff Inhalation Daily    acetaminophen **OR** acetaminophen, albuterol, ipratropium-albuterol, morphine injection, ondansetron **OR** ondansetron (ZOFRAN) IV, oxyCODONE, promethazine (PHENERGAN) injection (IM or IVPB)   Objective:   Vitals:   03/12/21 0200 03/12/21 0230 03/12/21 0549 03/12/21 0847  BP: (!) 169/97 (!) 180/98 (!) 176/96   Pulse: 75 93 97   Resp: 14 19 18    Temp: 98.4 F (36.9 C) 97.9 F (36.6 C) 97.8 F (36.6 C)   TempSrc:  Oral Oral   SpO2: 100% 100% 100% 100%  Weight:      Height:        Intake/Output Summary (Last 24 hours) at 03/12/2021  1049 Last data filed at 03/12/2021 0900 Gross per 24 hour  Intake 1100 ml  Output 800 ml  Net 300 ml   Filed Weights   03/11/21 2048  Weight: 46.3 kg     Examination:   Physical Exam  Constitution:  Alert, cooperative, no distress,  Appears calm and comfortable  Psychiatric: Normal and stable mood and affect, cognition intact,   HEENT: Normocephalic, PERRL, otherwise with in Normal limits  Chest:Chest symmetric Cardio vascular:  S1/S2, RRR, No murmure, No Rubs or Gallops  pulmonary: Clear to auscultation bilaterally, respirations unlabored, negative wheezes / crackles Abdomen: Soft, non-tender, non-distended, bowel sounds,no masses, no organomegaly Muscular skeletal: Limited exam - in bed, able to move all 4 extremities, Normal strength,  Neuro: CNII-XII intact. , normal motor and sensation, reflexes intact  Extremities: No pitting edema lower extremities, +2 pulses  Skin: Dry, warm to touch, negative for any Rashes, No open wounds Wounds: per nursing documentation Pressure Injury 03/12/21 Buttocks Right;Lower Stage 1 -  Intact skin with non-blanchable redness of a localized area usually over a bony prominence. Redness, nonblanchable, intact skin (Active)  03/12/21 0230  Location: Buttocks  Location Orientation: Right;Lower  Staging: Stage 1 -  Intact skin with non-blanchable redness of a localized area usually over a bony prominence.  Wound Description (Comments): Redness, nonblanchable, intact skin  Present on Admission: Yes    ------------------------------------------------------------------------------------------------------------------------------------------    LABs:  CBC Latest Ref Rng & Units 03/12/2021 03/11/2021 02/13/2021  WBC 4.0 - 10.5 K/uL 4.7 4.7 4.6  Hemoglobin 12.0 - 15.0 g/dL 12.0 11.7(L) 11.4(L)  Hematocrit 36.0 - 46.0 % 40.3 38.3 37.7  Platelets 150 - 400 K/uL 224 218 257   CMP Latest Ref Rng & Units 03/12/2021 03/11/2021 02/16/2021  Glucose 70 - 99 mg/dL  122(H) 111(H) 112(H)  BUN 8 - 23 mg/dL 6(L) 7(L) 11  Creatinine 0.44 - 1.00 mg/dL 0.44 0.37(L) 0.44  Sodium 135 - 145 mmol/L 138 138 136  Potassium 3.5 - 5.1 mmol/L 3.7 3.9 4.7  Chloride 98 - 111 mmol/L 96(L) 102 101  CO2 22 - 32 mmol/L 30 25 27   Calcium 8.9 - 10.3 mg/dL 8.9 9.2 9.6  Total Protein 6.5 - 8.1 g/dL 6.6 6.9 -  Total Bilirubin 0.3 - 1.2 mg/dL 1.0 0.4 -  Alkaline Phos 38 - 126 U/L 78 80 -  AST 15 - 41 U/L 18 18 -  ALT 0 - 44 U/L 17 17 -       Micro Results Recent Results (from the past 240 hour(s))  Resp Panel by RT-PCR (Flu A&B, Covid) Nasopharyngeal Swab     Status: None   Collection Time: 03/12/21 12:35 AM   Specimen: Nasopharyngeal Swab; Nasopharyngeal(NP) swabs in vial transport medium  Result Value Ref Range Status   SARS Coronavirus 2 by RT PCR NEGATIVE NEGATIVE Final    Comment: (NOTE) SARS-CoV-2 target nucleic acids are NOT DETECTED.  The SARS-CoV-2 RNA is generally detectable in upper respiratory specimens during the acute phase of infection. The lowest concentration of SARS-CoV-2 viral copies this assay can detect is 138 copies/mL. A negative result does not preclude SARS-Cov-2 infection and should not be used as the sole basis for treatment or other patient management decisions. A negative result may occur with  improper specimen collection/handling, submission of specimen other than nasopharyngeal swab, presence of viral mutation(s) within the areas targeted by this assay, and inadequate number of viral copies(<138 copies/mL). A negative result must be combined with clinical observations, patient history, and epidemiological information. The expected result is Negative.  Fact Sheet for Patients:  EntrepreneurPulse.com.au  Fact Sheet for Healthcare Providers:  IncredibleEmployment.be  This test is no t yet approved or cleared by the [place] FDA and  has been authorized for detection and/or diagnosis of  SARS-CoV-2 by FDA under an Emergency Use Authorization (EUA). This EUA will remain  in effect (meaning this test can be used) for the duration of the COVID-19 declaration under Section 564(b)(1) of the Act, 21 U.S.C.section 360bbb-3(b)(1), unless the authorization is terminated  or revoked sooner.       Influenza A by PCR NEGATIVE NEGATIVE Final   Influenza B by PCR NEGATIVE NEGATIVE Final    Comment: (NOTE) The Xpert Xpress SARS-CoV-2/FLU/RSV plus assay is intended as an aid in the diagnosis of influenza from Nasopharyngeal swab specimens and should not be used as a sole basis for treatment. Nasal washings and aspirates are unacceptable for Xpert Xpress SARS-CoV-2/FLU/RSV testing.  Fact Sheet for Patients: EntrepreneurPulse.com.au  Fact  Sheet for Healthcare Providers: IncredibleEmployment.be  This test is not yet approved or cleared by the [place] and has been authorized for detection and/or diagnosis of SARS-CoV-2 by FDA under an Emergency Use Authorization (EUA). This EUA will remain in effect (meaning this test can be used) for the duration of the COVID-19 declaration under Section 564(b)(1) of the Act, 21 U.S.C. section 360bbb-3(b)(1), unless the authorization is terminated or revoked.  Performed at [facility], [address], [place], [place] [address]     Radiology Reports CT Chest W Contrast  Result Date: 02/22/2021 CLINICAL DATA:  Non-small-cell lung cancer.  Restaging. EXAM: CT CHEST WITH CONTRAST TECHNIQUE: Multidetector CT imaging of the chest was performed during intravenous contrast administration. CONTRAST:  67mL OMNIPAQUE IOHEXOL 300 MG/ML  SOLN COMPARISON:  02/12/2021 FINDINGS: Cardiovascular: The heart size is normal. No substantial pericardial effusion. Coronary artery calcification is evident. Moderate atherosclerotic calcification is noted in the wall of the thoracic aorta. Mediastinum/Nodes: No mediastinal  lymphadenopathy. There is no hilar lymphadenopathy. The esophagus has normal imaging features. There is no axillary lymphadenopathy. Lungs/Pleura: Centrilobular and paraseptal emphysema evident. Status post right upper lobectomy. Stable scarring right middle lobe 3 mm peripheral left upper lobe nodule on 36/4 is new in the interval interval increase in small right pleural effusion. Upper Abdomen: Similar intra and extrahepatic biliary duct dilatation. Diffuse dilatation of the main pancreatic duct again noted. Pancreatic head and ampulla have not been included on the study. Musculoskeletal: No worrisome lytic or sclerotic osseous abnormality. Thoracic spinal stimulator device evident. IMPRESSION: 1. Status post right upper lobectomy. No findings to suggest definite recurrent or metastatic disease in the chest. 2. Interval increase in small right pleural effusion. 3. 3 mm peripheral left upper lobe pulmonary nodule, new in the interval. Attention on follow-up recommended. 4. Similar intra and extrahepatic biliary duct dilatation with diffuse dilatation of the main pancreatic duct. Pancreatic head and ampulla have not been included on the study. CTA 11/26/2020 described a 7 mm calcification in the region of the ampulla. MRCP follow-up could be used to further evaluate as clinically warranted. 5. Aortic Atherosclerosis (ICD10-I70.0) and Emphysema (ICD10-J43.9). Electronically Signed   By: Misty Stanley M.D.   On: 02/22/2021 10:38   CT Angio Chest Pulmonary Embolism (PE) W or WO Contrast  Result Date: 02/12/2021 CLINICAL DATA:  66 year old female with nausea, chest pain, pain between the shoulder blades. 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 EXAM: CT ANGIOGRAPHY CHEST WITH CONTRAST TECHNIQUE: Multidetector CT imaging of the chest was performed using the standard protocol during bolus administration of intravenous contrast. Multiplanar CT image reconstructions and MIPs were obtained  to evaluate the vascular anatomy. CONTRAST:  137mL OMNIPAQUE IOHEXOL 350 MG/ML SOLN COMPARISON:  Portable chest 0122 hours. Chest CTA 08/25/2020. CT Chest, Abdomen, and Pelvis 11/26/2020. FINDINGS: Cardiovascular: Good contrast bolus timing in the pulmonary arterial tree. No focal filling defect identified in the pulmonary arteries to suggest acute pulmonary embolism. Calcified coronary artery atherosclerosis. Calcified aortic atherosclerosis. No cardiomegaly or pericardial effusion. Mediastinum/Nodes: No mediastinal mass or lymphadenopathy. Postoperative changes at the right hilum. Lungs/Pleura: Postoperative changes of right upper lobectomy. Underlying emphysema. Architectural distortion about the right hilum. Small volume retained secretions in the right mainstem bronchus and bronchus intermedius. Stable lung volumes and ventilation compared to September. Occasional mild right lower lobe subpleural scarring or atelectasis. No pleural effusion. Upper Abdomen: Negative visible spleen, adrenal glands, left kidney, and bowel in the upper abdomen. Chronic intra and extrahepatic biliary  ductal dilatation partially visible and stable. Stable partially visible gallbladder. Chronic pancreatic atrophy with prominence of the main pancreatic duct partially visible and stable. Musculoskeletal: Osteopenia. Previous cervical ACDF. Chronic midthoracic spinal stimulator. No acute or suspicious osseous lesion identified. Review of the MIP images confirms the above findings. IMPRESSION: 1. No evidence of acute pulmonary embolus. 2. Small volume retained secretions in the right mainstem bronchus. Otherwise stable right upper lobectomy, Emphysema (ICD10-J43.9). 3. Calcified coronary artery and Aortic Atherosclerosis (ICD10-I70.0). Electronically Signed   By: Genevie Ann M.D.   On: 02/12/2021 04:46   CT ABDOMEN PELVIS W CONTRAST  Result Date: 03/11/2021 CLINICAL DATA:  Abdominal pain, acute, nonlocalized. Nausea, vomiting EXAM: CT  ABDOMEN AND PELVIS WITH CONTRAST TECHNIQUE: Multidetector CT imaging of the abdomen and pelvis was performed using the standard protocol following bolus administration of intravenous contrast. CONTRAST:  124mL OMNIPAQUE IOHEXOL 300 MG/ML  SOLN COMPARISON:  11/26/2020 FINDINGS: Lower chest: No acute abnormality. Hepatobiliary: Gallbladder unremarkable. Stable intrahepatic and extrahepatic biliary ductal dilatation. No focal hepatic abnormality. Pancreas: Calcifications in the pancreatic head and uncinate process are similar to prior study. One calcification is in the region of the ampulla. Pancreatic duct is dilated, stable since prior study. Spleen: No focal abnormality.  Normal size. Adrenals/Urinary Tract: Small bilateral renal cysts appear benign and stable. No hydronephrosis. Adrenal glands and urinary bladder unremarkable. Stomach/Bowel: No evidence of bowel obstruction. Stomach, large and small bowel grossly unremarkable. Vascular/Lymphatic: Heavily calcified aorta and iliac vessels. No evidence of aneurysm or adenopathy. Reproductive: Prior hysterectomy.  No adnexal masses. Other: No free fluid or free air. Musculoskeletal: No acute bony abnormality. Postoperative changes in the lower lumbar spine. IMPRESSION: Trace right pleural effusion. Stable calcifications in the pancreatic head and in the region of the ampullary with associated biliary and pancreatic ductal dilatation. This could be further evaluated with nonemergent MRCP if felt clinically indicated. Stable bilateral renal cysts. Aortoiliac atherosclerosis. Electronically Signed   By: Rolm Baptise M.D.   On: 03/11/2021 23:19   DG Chest Port 1 View  Result Date: 03/11/2021 CLINICAL DATA:  Chest pain with nausea vomit EXAM: PORTABLE CHEST 1 VIEW COMPARISON:  02/12/2021 FINDINGS: Small right pleural effusion or pleural thickening. Stable cardiomediastinal silhouette. Postsurgical changes in the right suprahilar lung. Emphysema with mild chronic  bronchitic changes. IMPRESSION: No active disease.  Emphysema and postsurgical changes on the right. Electronically Signed   By: Donavan Foil M.D.   On: 03/11/2021 21:32   DG Chest Port 1 View  Result Date: 02/12/2021 CLINICAL DATA:  Chest pain EXAM: PORTABLE CHEST 1 VIEW COMPARISON:  01/04/2021 FINDINGS: Postsurgical changes in the right hemithorax. Nodular opacity in the right perihilar region likely reflect scarring when correlating with prior CT. Left lung is essentially clear. Mild blunting of the right costophrenic angle, likely postsurgical. No definite pleural effusion or pneumothorax. The heart is normal in size.  Thoracic aortic atherosclerosis. Thoracic spine stimulator. IMPRESSION: Postsurgical changes in the right hemithorax. No evidence of acute cardiopulmonary disease. Electronically Signed   By: Julian Hy M.D.   On: 02/12/2021 01:44   US Abdomen Limited RUQ (LIVER/GB)  Result Date: 02/12/2021 CLINICAL DATA:  66 year old female with nausea, pain. EXAM: ULTRASOUND ABDOMEN LIMITED RIGHT UPPER QUADRANT COMPARISON:  CTA chest 0415 hours today. CT Chest, Abdomen, and Pelvis today are reported separately. 11/26/2020. Ultrasound 01/06/2019. FINDINGS: Gallbladder: Evidence of some sludge. No shadowing echogenic stones. Gallbladder size relatively normal. Gallbladder wall thickness at the upper limits of normal 2-3 mm. No pericholecystic fluid. Common  bile duct: Diameter: 10 mm, chronically dilated. In 2020 the CBD measured up to 17 mm. And roughly 16 mm on the September CT Abdomen and Pelvis. No intraductal filling defect identified. Liver: Echogenic liver (image 33). No discrete liver lesion. Little to no intrahepatic biliary ductal dilatation as demonstrated by CT. Portal vein is patent on color Doppler imaging with normal direction of blood flow towards the liver. Other: Negative visible right kidney. IMPRESSION: 1. Chronically dilated CBD. No intraductal filling defect identified. 2.  Gallbladder sludge, but no cholelithiasis or strong evidence of acute cholecystitis. 3. Fatty liver. Electronically Signed   By: Genevie Ann M.D.   On: 02/12/2021 09:40    SIGNED: Deatra James, MD, FHM. [facility],  Pager (please use [email or web address] to page/text) Please use Epic Secure Chat for non-urgent communication (7AM-7PM)  If 7PM-7AM, please contact night-coverage [email or web address], 03/12/2021, 10:49 AM

## 2021-03-12 NOTE — Plan of Care (Signed)

## 2021-03-12 NOTE — H&P (Signed)
TRH H&P    Patient Demographics:    Jasmine [name], is a 66 y.o. female  MRN: [id number]  DOB - [birth year]  Admit Date - 03/11/2021  Referring MD/NP/PA: Roderic Palau  Outpatient Primary MD for the patient is Janora Norlander, DO  Patient coming from: Home  Chief complaint- Abdominal pain   HPI:    Jasmine [name]  is a 66 y.o. female, with history of anemia, anxiety, chronic back pain, tobacco use disorder, COPD, history of lung cancer ectomy, GERD, pancreatitis, seizures, and more presents to ED with a chief complaint of intractable abdominal pain and vomiting.  Patient reports that she had nausea, vomiting, epigastric pain, upper back pain, shoulder pain since yesterday.  In that time she has had 12-15 episodes of emesis.  She denies hematemesis.  She had a decreased appetite.  Her last normal meal was yesterday.  Her last normal bowel movement was yesterday.  Patient denies any diarrhea or constipation.  She does have dysuria but this is chronic.  She has had no changes in her urine.  She reports no fever with a T-max of 100.0.  Reports the pain in her epigastrium feels like a constant stabbing.  It is worse with the cough.  Her cough is very normal smoker's cough.  Patient describes the pain between her shoulder blades as stabbing as well.  There is no radiation.  There is no tearing like sensation.  Patient reports this feels very similar to her previous admission.  At her last admission her discharge diagnosis was intractable abdominal pain and nausea.  She was advised to continue as needed antiemetics and analgesics.  Her symptoms were suggestive of acute on chronic pancreatitis, and she is advised to continue the use of pancreatic enzymes.  She was weaned off IV pain medications and started on long-acting OxyContin as well as advised to continue her Protonix.  Patient was discharged in stable condition.  Patient is a  current smoker and request nicotine patch.  She does not drink alcohol, does not use illicit drugs, and is vaccinated for COVID.  Patient is full code.  In the ED Patient was tachycardic, respiratory rate was stable, blood pressure was hypertensive and she was satting 100% on her normal oxygen requirement.  She had no leukocytosis, hemoglobin stable.  Chemistry panel was unremarkable.  Lipase 21.  CT abdomen pelvis showed chronic pancreatitis with some ductal dilatation that was present previously.  Her chest x-ray showed no active disease.  Her EKG showed a heart rate of 112, sinus tachycardia, QTC 416 with left atrial enlargement.  Patient was given Dilaudid, morphine, Protonix, Phenergan and 2 L bolus.  Admission was requested for management of intractable abdominal pain    Review of systems:    In addition to the HPI above,  No Fever-chills, No Headache, No changes with Vision or hearing, No problems swallowing food or Liquids, No Chest pain, admits to chronic cough and chronic shortness of breath Admits to abdominal pain, nausea, vomiting admits to chronic dysuria No Blood in stool or Urine, No  dysuria, No new skin rashes or bruises, No new joints pains-aches,  No new weakness, tingling, numbness in any extremity, No recent weight gain or loss, No polyuria, polydypsia or polyphagia, No significant Mental Stressors.  All other systems reviewed and are negative.    Past History of the following :    Past Medical History:  Diagnosis Date   Allergy    Anemia    Anxiety    Arthritis    Asthma    Carpal tunnel syndrome    Chronic back pain    Cigarette smoker 1/61/0960   Complication of anesthesia    in the past has had N/V, the last few surgeries she has not been   COPD (chronic obstructive pulmonary disease) ([place])    Easy bruising 07/10/2016   GERD (gastroesophageal reflux disease)    Headache    Heart murmur 2005   never had any problems   History of blood transfusion  2018   History of kidney stones    Hypertension    Hypoglycemia    Hypoglycemia    Iron deficiency anemia 07/11/2016   Neuropathy    both arms   Normocytic anemia 07/29/2015   Pancreatitis    Pneumonia    PONV (postoperative nausea and vomiting)    Postoperative anemia due to acute blood loss 11/15/2015   R lung cancer    Sciatica    Seizures ([place])    as a child when she would have an asthma attack. none as an adult   Shortness of breath dyspnea       Past Surgical History:  Procedure Laterality Date   ABDOMINAL HYSTERECTOMY  4540   APPLICATION OF ROBOTIC ASSISTANCE FOR SPINAL PROCEDURE N/A 09/05/2016   Procedure: APPLICATION OF ROBOTIC ASSISTANCE FOR SPINAL PROCEDURE;  Surgeon: Consuella Lose, MD;  Location: [place];  Service: Neurosurgery;  Laterality: N/A;   BACK SURGERY     BIOPSY  06/14/2020   Procedure: BIOPSY;  Surgeon: Daneil Dolin, MD;  [facility];  Service: Endoscopy;;   [place]   COLONOSCOPY     unsure last   COLONOSCOPY WITH PROPOFOL N/A 06/14/2020   Procedure: COLONOSCOPY WITH PROPOFOL;  Surgeon: Daneil Dolin, MD;  [facility];  Service: Endoscopy;  Laterality: N/A;  PM   cyst removal face     disk repair     neck and lumbar region   ESOPHAGOGASTRODUODENOSCOPY (EGD) WITH PROPOFOL N/A 06/14/2020   Procedure: ESOPHAGOGASTRODUODENOSCOPY (EGD) WITH PROPOFOL;  Surgeon: Daneil Dolin, MD;  [facility];  Service: Endoscopy;  Laterality: N/A;   FOOT SURGERY Bilateral    to file bones down    HARDWARE REMOVAL N/A 09/05/2016   Procedure: HARDWARE REMOVAL AND REPLACEMENT OF LUMBAR FIVE SCREWS;  Surgeon: Consuella Lose, MD;  Location: [place];  Service: Neurosurgery;  Laterality: N/A;   IMPLANTATION / PLACEMENT EPIDURAL NEUROSTIMULATOR ELECTRODES     INTERCOSTAL NERVE BLOCK Right 07/29/2020   Procedure: INTERCOSTAL NERVE BLOCK;  Surgeon: Melrose Nakayama, MD;  Location: [place];  Service:  Thoracic;  Laterality: Right;   LAMINECTOMY     2006   lobectomy Right 07/2020   LUMBAR FUSION     LYMPH NODE BIOPSY Right 07/29/2020   Procedure: LYMPH NODE BIOPSY;  Surgeon: Melrose Nakayama, MD;  Location: [place];  Service: Thoracic;  Laterality: Right;   POLYPECTOMY  06/14/2020   Procedure: POLYPECTOMY INTESTINAL;  Surgeon: Manus Rudd  M, MD;  [facility];  Service: Endoscopy;;   SPINAL CORD STIMULATOR BATTERY EXCHANGE     SPINAL CORD STIMULATOR INSERTION N/A 07/05/2016   Procedure: REPLACEMENT OF LUMBAR SPINAL CORD STIMULATOR BATTERY;  Surgeon: Newman Pies, MD;  Location: [place];  Service: Neurosurgery;  Laterality: N/A;   TONSILLECTOMY        Social History:      Social History   Tobacco Use   Smoking status: Every Day    Packs/day: 0.25    Years: 35.00    Pack years: 8.75    Types: Cigarettes   Smokeless tobacco: Never   Tobacco comments:    trying to quit  Substance Use Topics   Alcohol use: Yes    Alcohol/week: 2.0 standard drinks    Types: 2 Glasses of wine per week    Comment: occasionally       Family History :     Family History  Problem Relation Age of Onset   Heart disease Mother    Diabetes Mother    Hypertension Mother    Hyperlipidemia Mother    COPD Mother        smoked   Cancer Mother        bile duct   COPD Father    Diabetes Father    Heart disease Father    Cancer Father        throat   Dementia Father    Asthma Son    Hypertension Sister    Hyperlipidemia Sister    Hypertension Brother    Hyperlipidemia Brother    Hypertension Brother    Hyperlipidemia Brother    Emphysema Maternal Grandmother        smoked   Colon cancer Neg Hx    Colon polyps Neg Hx       Home Medications:   Prior to Admission medications   Medication Sig Start Date End Date Taking? Authorizing Provider  albuterol (VENTOLIN HFA) 108 (90 Base) MCG/ACT inhaler Inhale into the lungs every 6 (six) hours as needed for wheezing or  shortness of breath.    [provider]  atorvastatin (LIPITOR) 40 MG tablet Take 1 tablet (40 mg total) by mouth daily. 07/09/20   Ronnie Doss M, DO  budesonide-formoterol (SYMBICORT) 160-4.5 MCG/ACT inhaler USE 2 INHALATIONS TWICE A DAY 01/17/21   Ronnie Doss M, DO  Cholecalciferol 125 MCG (5000 UT) capsule Take 5,000 Units by mouth daily.    [provider]  dextromethorphan-guaiFENesin (MUCINEX DM) 30-600 MG 12hr tablet Take 1 tablet by mouth 2 (two) times daily.    [provider]  DULoxetine (CYMBALTA) 30 MG capsule Take 3 capsules (90 mg total) by mouth daily. 07/09/20   Janora Norlander, DO  ipratropium-albuterol (DUONEB) 0.5-2.5 (3) MG/3ML SOLN Inhale 3 mLs into the lungs 4 (four) times daily as needed (for shortness of breath/wheezing). *May use as needed 02/21/16   [provider]  Iron Polysacch Cmplx-B12-FA (POLY-IRON 150 FORTE) 150-0.025-1 MG CAPS Take 1 capsule by mouth 2 (two) times daily. 05/27/20   Derek Jack, MD  L-Methylfolate-Algae-B12-B6 [facility]) 3-90.314-2-35 MG CAPS TAKE 1 TABLET BY MOUTH TWO TIMES A DAY Patient taking differently: Take 1 tablet by mouth daily. 09/10/19   Janora Norlander, DO  magnesium oxide (MAG-OX) 400 (241.3 Mg) MG tablet Take 400 mg by mouth 2 (two) times daily. 02/07/20   [provider]  nicotine (NICODERM CQ) 14 mg/24hr patch Place 1 patch (14 mg total) onto the skin  daily. 06/23/20   Janora Norlander, DO  Nutritional Supplements (ENSURE HIGH PROTEIN) LIQD Drink 1 can 3 times daily. 08/30/20   Janora Norlander, DO  omeprazole (PRILOSEC) 40 MG capsule Take one capsule once to twice daily before a meal. 01/18/21   Mahala Menghini, PA-C  ondansetron (ZOFRAN ODT) 4 MG disintegrating tablet Take 1 tablet (4 mg total) by mouth every 8 (eight) hours as needed for nausea or vomiting. 09/12/20   Maudie Flakes, MD  oxybutynin (DITROPAN XL) 5 MG 24 hr tablet Take 1 tablet (5 mg total) by mouth  at bedtime. 03/01/21   Janora Norlander, DO  oxyCODONE-acetaminophen (PERCOCET) 10-325 MG tablet Take 1 tablet by mouth 5 (five) times daily. 08/27/20   [provider]  OXYCONTIN 15 MG 12 hr tablet Take 15 mg by mouth every 12 (twelve) hours. 08/28/20   [provider]  OXYGEN Inhale 3 L into the lungs See admin instructions. continuous    [provider]  SPIRIVA RESPIMAT 2.5 MCG/ACT AERS USE 2 INHALATIONS DAILY Patient taking differently: Inhale 2 puffs into the lungs daily. 07/13/20   Tanda Rockers, MD  tizanidine (ZANAFLEX) 6 MG capsule Take 6 mg by mouth 3 (three) times daily. 08/27/20   [provider]  traZODone (DESYREL) 150 MG tablet TAKE 1 TO 2 TABLETS AT BEDTIME Patient taking differently: Take 150-300 mg by mouth at bedtime. 02/02/20   Janora Norlander, DO     Allergies:     Allergies  Allergen Reactions   Lyrica [Pregabalin] Other (See Comments)    EXTREME SHAKING/TREMBLING   Darvon [Propoxyphene]     UNSPECIFIED REACTION    Gabapentin     Extreme shaking and trembling    Augmentin [Amoxicillin-Pot Clavulanate] Nausea And Vomiting    Has patient had a PCN reaction causing immediate rash, facial/tongue/throat swelling, SOB or lightheadedness with hypotension:no Has patient had a PCN reaction causing severe rash involving mucus membranes or skin necrosis:No Has patient had a PCN reaction that required hospitalization:No Has patient had a PCN reaction occurring within the last 10 years:Yes--ONLY N/V If all of the above answers are "NO", then may proceed with Cephalosporin use.    Erythromycin Itching and Rash        Vibramycin [Doxycycline Calcium] Itching and Rash    "[place] "     Physical Exam:   Vitals  Blood pressure (!) 188/107, pulse (!) 110, temperature 98.6 F (37 C), temperature source Oral, resp. rate 18, height 5\' 3"  (1.6 m), weight 46.3 kg, SpO2 100 %.   1.  General: Patient lying supine in bed,  no acute  distress   2. Psychiatric: Alert and oriented x 3, mood and behavior normal for situation, pleasant and cooperative with exam   3. Neurologic: Speech and language are normal, face is symmetric, moves all 4 extremities voluntarily, at baseline without acute deficits on limited exam   4. HEENMT:  Head is atraumatic, normocephalic, pupils reactive to light, neck is supple, trachea is midline, mucous membranes are moist   5. Respiratory : Minich breath sounds in the right lung field, clear on the left, no increase in work of breathing or accessory muscle use maintaining oxygen saturations on nasal cannula   6. Cardiovascular : Heart rate normal, rhythm is regular, no murmurs, rubs or gallops, no peripheral edema, peripheral pulses palpated   7. Gastrointestinal:  Abdomen is soft, nondistended, nontender to palpation bowel sounds active, no masses or organomegaly palpated  8. Skin:  Skin is warm, dry and intact without rashes, acute lesions, or ulcers on limited exam   9.Musculoskeletal:  No acute deformities or trauma, no asymmetry in tone, no peripheral edema, peripheral pulses palpated, no tenderness to palpation in the extremities     Data Review:    CBC Recent Labs  Lab 03/11/21 2053  WBC 4.7  HGB 11.7*  HCT 38.3  PLT 218  MCV 104.9*  MCH 32.1  MCHC 30.5  RDW 16.5*  LYMPHSABS 0.9  MONOABS 0.4  EOSABS 0.0  BASOSABS 0.0   ------------------------------------------------------------------------------------------------------------------  Results for orders placed or performed during the hospital encounter of 03/11/21 (from the past 48 hour(s))  CBC with Differential/Platelet     Status: Abnormal   Collection Time: 03/11/21  8:53 PM  Result Value Ref Range   WBC 4.7 4.0 - 10.5 K/uL   RBC 3.65 (L) 3.87 - 5.11 MIL/uL   Hemoglobin 11.7 (L) 12.0 - 15.0 g/dL   HCT 38.3 36.0 - 46.0 %   MCV 104.9 (H) 80.0 - 100.0 fL   MCH 32.1 26.0 - 34.0 pg   MCHC 30.5 30.0 - 36.0  g/dL   RDW 16.5 (H) 11.5 - 15.5 %   Platelets 218 150 - 400 K/uL   nRBC 0.0 0.0 - 0.2 %   Neutrophils Relative % 69 %   Neutro Abs 3.3 1.7 - 7.7 K/uL   Lymphocytes Relative 20 %   Lymphs Abs 0.9 0.7 - 4.0 K/uL   Monocytes Relative 8 %   Monocytes Absolute 0.4 0.1 - 1.0 K/uL   Eosinophils Relative 1 %   Eosinophils Absolute 0.0 0.0 - 0.5 K/uL   Basophils Relative 1 %   Basophils Absolute 0.0 0.0 - 0.1 K/uL   Immature Granulocytes 1 %   Abs Immature Granulocytes 0.05 0.00 - 0.07 K/uL    Comment: Performed at [facility], [address], [place], [place] [address]  Comprehensive metabolic panel     Status: Abnormal   Collection Time: 03/11/21  8:53 PM  Result Value Ref Range   Sodium 138 135 - 145 mmol/L   Potassium 3.9 3.5 - 5.1 mmol/L   Chloride 102 98 - 111 mmol/L   CO2 25 22 - 32 mmol/L   Glucose, Bld 111 (H) 70 - 99 mg/dL    Comment: Glucose reference range applies only to samples taken after fasting for at least 8 hours.   BUN 7 (L) 8 - 23 mg/dL   Creatinine, Ser 0.37 (L) 0.44 - 1.00 mg/dL   Calcium 9.2 8.9 - 10.3 mg/dL   Total Protein 6.9 6.5 - 8.1 g/dL   Albumin 3.7 3.5 - 5.0 g/dL   AST 18 15 - 41 U/L   ALT 17 0 - 44 U/L   Alkaline Phosphatase 80 38 - 126 U/L   Total Bilirubin 0.4 0.3 - 1.2 mg/dL   GFR, Estimated >60 >60 mL/min    Comment: (NOTE) Calculated using the CKD-EPI Creatinine Equation (2021)    Anion gap 11 5 - 15    Comment: Performed at [facility], [address], [place], [place] [address]  Troponin I (High Sensitivity)     Status: None   Collection Time: 03/11/21  8:53 PM  Result Value Ref Range   Troponin I (High Sensitivity) 13 <18 ng/L    Comment: (NOTE) Elevated high sensitivity troponin I (hsTnI) values and significant  changes across serial measurements may suggest ACS but many other  chronic and acute conditions  are known to elevate hsTnI results.  Refer to the "Links" section for chest pain algorithms and additional  guidance. Performed  at [facility], [address] [place], [place] [address]   Lipase, blood     Status: None   Collection Time: 03/11/21  8:53 PM  Result Value Ref Range   Lipase 21 11 - 51 U/L    Comment: Performed at [facility], [address], [place], [place] [address]  Troponin I (High Sensitivity)     Status: None   Collection Time: 03/11/21 11:23 PM  Result Value Ref Range   Troponin I (High Sensitivity) 12 <18 ng/L    Comment: (NOTE) Elevated high sensitivity troponin I (hsTnI) values and significant  changes across serial measurements may suggest ACS but many other  chronic and acute conditions are known to elevate hsTnI results.  Refer to the "Links" section for chest pain algorithms and additional  guidance. Performed at [facility], [address] [place], [place] [address]     Chemistries  Recent Labs  Lab 03/11/21 2053  NA 138  K 3.9  CL 102  CO2 25  GLUCOSE 111*  BUN 7*  CREATININE 0.37*  CALCIUM 9.2  AST 18  ALT 17  ALKPHOS 80  BILITOT 0.4   ------------------------------------------------------------------------------------------------------------------  ------------------------------------------------------------------------------------------------------------------ GFR: Estimated Creatinine Clearance: 49.9 mL/min (A) (by C-G formula based on SCr of 0.37 mg/dL (L)). Liver Function Tests: Recent Labs  Lab 03/11/21 2053  AST 18  ALT 17  ALKPHOS 80  BILITOT 0.4  PROT 6.9  ALBUMIN 3.7   Recent Labs  Lab 03/11/21 2053  LIPASE 21   No results for input(s): AMMONIA in the last 168 hours. Coagulation Profile: No results for input(s): INR, PROTIME in the last 168 hours. Cardiac Enzymes: No results for input(s): CKTOTAL, CKMB, CKMBINDEX, TROPONINI in the last 168 hours. BNP (last 3 results) No results for input(s): PROBNP in the last 8760 hours. HbA1C: No results for input(s): HGBA1C in the last 72 hours. CBG: No results for input(s): GLUCAP in the last  168 hours. Lipid Profile: No results for input(s): CHOL, HDL, LDLCALC, TRIG, CHOLHDL, LDLDIRECT in the last 72 hours. Thyroid Function Tests: No results for input(s): TSH, T4TOTAL, FREET4, T3FREE, THYROIDAB in the last 72 hours. Anemia Panel: No results for input(s): VITAMINB12, FOLATE, FERRITIN, TIBC, IRON, RETICCTPCT in the last 72 hours.  --------------------------------------------------------------------------------------------------------------- Urine analysis:    Component Value Date/Time   COLORURINE YELLOW 02/12/2021 1110   APPEARANCEUR CLEAR 02/12/2021 1110   APPEARANCEUR Clear 07/26/2017 1636   LABSPEC <1.005 (L) 02/12/2021 1110   PHURINE 7.0 02/12/2021 1110   GLUCOSEU NEGATIVE 02/12/2021 1110   HGBUR NEGATIVE 02/12/2021 1110   BILIRUBINUR SMALL (A) 02/12/2021 1110   BILIRUBINUR Negative 07/26/2017 1636   KETONESUR 15 (A) 02/12/2021 1110   PROTEINUR TRACE (A) 02/12/2021 1110   NITRITE NEGATIVE 02/12/2021 1110   LEUKOCYTESUR NEGATIVE 02/12/2021 1110      Imaging Results:    CT ABDOMEN PELVIS W CONTRAST  Result Date: 03/11/2021 CLINICAL DATA:  Abdominal pain, acute, nonlocalized. Nausea, vomiting EXAM: CT ABDOMEN AND PELVIS WITH CONTRAST TECHNIQUE: Multidetector CT imaging of the abdomen and pelvis was performed using the standard protocol following bolus administration of intravenous contrast. CONTRAST:  160mL OMNIPAQUE IOHEXOL 300 MG/ML  SOLN COMPARISON:  11/26/2020 FINDINGS: Lower chest: No acute abnormality. Hepatobiliary: Gallbladder unremarkable. Stable intrahepatic and extrahepatic biliary ductal dilatation. No focal hepatic abnormality. Pancreas: Calcifications in the pancreatic head and uncinate process are similar to prior  study. One calcification is in the region of the ampulla. Pancreatic duct is dilated, stable since prior study. Spleen: No focal abnormality.  Normal size. Adrenals/Urinary Tract: Small bilateral renal cysts appear benign and stable. No  hydronephrosis. Adrenal glands and urinary bladder unremarkable. Stomach/Bowel: No evidence of bowel obstruction. Stomach, large and small bowel grossly unremarkable. Vascular/Lymphatic: Heavily calcified aorta and iliac vessels. No evidence of aneurysm or adenopathy. Reproductive: Prior hysterectomy.  No adnexal masses. Other: No free fluid or free air. Musculoskeletal: No acute bony abnormality. Postoperative changes in the lower lumbar spine. IMPRESSION: Trace right pleural effusion. Stable calcifications in the pancreatic head and in the region of the ampullary with associated biliary and pancreatic ductal dilatation. This could be further evaluated with nonemergent MRCP if felt clinically indicated. Stable bilateral renal cysts. Aortoiliac atherosclerosis. Electronically Signed   By: Rolm Baptise M.D.   On: 03/11/2021 23:19   DG Chest Port 1 View  Result Date: 03/11/2021 CLINICAL DATA:  Chest pain with nausea vomit EXAM: PORTABLE CHEST 1 VIEW COMPARISON:  02/12/2021 FINDINGS: Small right pleural effusion or pleural thickening. Stable cardiomediastinal silhouette. Postsurgical changes in the right suprahilar lung. Emphysema with mild chronic bronchitic changes. IMPRESSION: No active disease.  Emphysema and postsurgical changes on the right. Electronically Signed   By: Donavan Foil M.D.   On: 03/11/2021 21:32       Assessment & Plan:    Principal Problem:   Intractable abdominal pain Active Problems:   HTN (hypertension)   Tobacco abuse   GERD (gastroesophageal reflux disease)   intractable abdominal pain and nausea Dilaudid and morphine given in the ED Continue working Zofran for nausea and vomiting Phenergan for refractory nausea and vomiting Advance diet as tolerated, as of now n.p.o. except for sips CT abdomen shows chronic changes, patient had an ultrasound of abdomen at her last visit, no further imaging to be done at this time No antibiotics indicated at this time Continue to  monitor Hypertension Continue Norvasc COPD Continue Symbicort and as needed albuterol Tobacco use disorder Counseled on the importance of cessation Continue nicotine patch Hyperlipidemia Continue statin GERD Continue PPI    DVT Prophylaxis-   Heparin - SCDs   AM Labs Ordered, also please review Full Orders  Family Communication: No family at bedside  Code Status:  Full  Admission status: Observation     Disposition: Anticipated Discharge date 24 hours discharge to home  Time spent in minutes : [place]

## 2021-03-13 DIAGNOSIS — F1190 Opioid use, unspecified, uncomplicated: Secondary | ICD-10-CM

## 2021-03-13 DIAGNOSIS — I10 Essential (primary) hypertension: Secondary | ICD-10-CM

## 2021-03-13 DIAGNOSIS — Z720 Tobacco use: Secondary | ICD-10-CM

## 2021-03-13 DIAGNOSIS — K859 Acute pancreatitis without necrosis or infection, unspecified: Secondary | ICD-10-CM

## 2021-03-13 DIAGNOSIS — L89309 Pressure ulcer of unspecified buttock, unspecified stage: Secondary | ICD-10-CM

## 2021-03-13 DIAGNOSIS — K861 Other chronic pancreatitis: Principal | ICD-10-CM

## 2021-03-13 DIAGNOSIS — G894 Chronic pain syndrome: Secondary | ICD-10-CM

## 2021-03-13 LAB — COMPREHENSIVE METABOLIC PANEL
ALT: 17 U/L (ref 0–44)
AST: 22 U/L (ref 15–41)
Albumin: 3.2 g/dL — ABNORMAL LOW (ref 3.5–5.0)
Alkaline Phosphatase: 78 U/L (ref 38–126)
Anion gap: 9 (ref 5–15)
BUN: 7 mg/dL — ABNORMAL LOW (ref 8–23)
CO2: 28 mmol/L (ref 22–32)
Calcium: 8.5 mg/dL — ABNORMAL LOW (ref 8.9–10.3)
Chloride: 99 mmol/L (ref 98–111)
Creatinine, Ser: 0.36 mg/dL — ABNORMAL LOW (ref 0.44–1.00)
GFR, Estimated: >60 mL/min (ref >60)
Glucose, Bld: 111 mg/dL — ABNORMAL HIGH (ref 70–99)
Potassium: 3.3 mmol/L — ABNORMAL LOW (ref 3.5–5.1)
Sodium: 136 mmol/L (ref 135–145)
Total Bilirubin: 0.8 mg/dL (ref 0.3–1.2)
Total Protein: 6 g/dL — ABNORMAL LOW (ref 6.5–8.1)

## 2021-03-13 LAB — CBC WITH DIFFERENTIAL/PLATELET
Abs Immature Granulocytes: 0.05 10*3/uL (ref 0.00–0.07)
Basophils Absolute: 0 10*3/uL (ref 0.0–0.1)
Basophils Relative: 1 %
Eosinophils Absolute: 0.1 10*3/uL (ref 0.0–0.5)
Eosinophils Relative: 3 %
HCT: 38.3 % (ref 36.0–46.0)
Hemoglobin: 11.5 g/dL — ABNORMAL LOW (ref 12.0–15.0)
Immature Granulocytes: 1 %
Lymphocytes Relative: 32 %
Lymphs Abs: 1.4 10*3/uL (ref 0.7–4.0)
MCH: 31.8 pg (ref 26.0–34.0)
MCHC: 30 g/dL (ref 30.0–36.0)
MCV: 105.8 fL — ABNORMAL HIGH (ref 80.0–100.0)
Monocytes Absolute: 0.6 10*3/uL (ref 0.1–1.0)
Monocytes Relative: 15 %
Neutro Abs: 2.1 10*3/uL (ref 1.7–7.7)
Neutrophils Relative %: 48 %
Platelets: 175 10*3/uL (ref 150–400)
RBC: 3.62 MIL/uL — ABNORMAL LOW (ref 3.87–5.11)
RDW: 16.3 % — ABNORMAL HIGH (ref 11.5–15.5)
WBC: 4.3 10*3/uL (ref 4.0–10.5)
nRBC: 0 % (ref 0.0–0.2)

## 2021-03-13 LAB — AMYLASE: Amylase: 25 U/L — ABNORMAL LOW (ref 28–100)

## 2021-03-13 LAB — MAGNESIUM: Magnesium: 1.6 mg/dL — ABNORMAL LOW (ref 1.7–2.4)

## 2021-03-13 LAB — PHOSPHORUS: Phosphorus: 2.6 mg/dL (ref 2.5–4.6)

## 2021-03-13 LAB — LIPASE, BLOOD: Lipase: 22 U/L (ref 11–51)

## 2021-03-13 MED ORDER — OXYCODONE HCL ER 15 MG PO T12A
15.0000 mg | EXTENDED_RELEASE_TABLET | Freq: Two times a day (BID) | ORAL | Status: DC
  Administered 2021-03-13 – 2021-03-14 (×3): 15 mg via ORAL
  Filled 2021-03-13 (×3): qty 1

## 2021-03-13 MED ORDER — AMLODIPINE BESYLATE 5 MG PO TABS
10.0000 mg | ORAL_TABLET | Freq: Every day | ORAL | Status: DC
  Administered 2021-03-13 – 2021-03-14 (×2): 10 mg via ORAL
  Filled 2021-03-13 (×2): qty 2

## 2021-03-13 MED ORDER — OXYCODONE HCL 5 MG PO TABS
5.0000 mg | ORAL_TABLET | Freq: Every day | ORAL | Status: DC
  Administered 2021-03-13 – 2021-03-14 (×7): 5 mg via ORAL
  Filled 2021-03-13 (×7): qty 1

## 2021-03-13 MED ORDER — LISINOPRIL 5 MG PO TABS
2.5000 mg | ORAL_TABLET | Freq: Every day | ORAL | Status: DC
  Administered 2021-03-13 – 2021-03-14 (×2): 2.5 mg via ORAL
  Filled 2021-03-13 (×2): qty 1

## 2021-03-13 MED ORDER — MAGNESIUM SULFATE 2 GM/50ML IV SOLN
2.0000 g | Freq: Once | INTRAVENOUS | Status: AC
  Administered 2021-03-13: 2 g via INTRAVENOUS
  Filled 2021-03-13: qty 50

## 2021-03-13 MED ORDER — POTASSIUM CHLORIDE 10 MEQ/100ML IV SOLN
10.0000 meq | INTRAVENOUS | Status: AC
  Administered 2021-03-13 (×3): 10 meq via INTRAVENOUS
  Filled 2021-03-13 (×3): qty 100

## 2021-03-13 MED ORDER — POTASSIUM CHLORIDE 10 MEQ/100ML IV SOLN
10.0000 meq | INTRAVENOUS | Status: AC
  Administered 2021-03-13 – 2021-03-14 (×2): 10 meq via INTRAVENOUS
  Filled 2021-03-13 (×2): qty 100

## 2021-03-13 MED ORDER — OXYCODONE-ACETAMINOPHEN 5-325 MG PO TABS
1.0000 | ORAL_TABLET | Freq: Every day | ORAL | Status: DC
  Administered 2021-03-13 – 2021-03-14 (×7): 1 via ORAL
  Filled 2021-03-13 (×7): qty 1

## 2021-03-13 MED ORDER — TIZANIDINE HCL 2 MG PO TABS
6.0000 mg | ORAL_TABLET | Freq: Three times a day (TID) | ORAL | Status: DC
  Administered 2021-03-13 – 2021-03-14 (×3): 6 mg via ORAL
  Filled 2021-03-13 (×3): qty 3

## 2021-03-13 MED ORDER — MAGNESIUM SULFATE 50 % IJ SOLN: 3.0000 g | Freq: Once | INTRAVENOUS | Status: DC

## 2021-03-13 MED ORDER — MAGNESIUM SULFATE IN D5W 1-5 GM/100ML-% IV SOLN
1.0000 g | Freq: Once | INTRAVENOUS | Status: AC
  Administered 2021-03-13: 1 g via INTRAVENOUS
  Filled 2021-03-13: qty 100

## 2021-03-13 NOTE — Progress Notes (Signed)
PROGRESS NOTE    [name]  RXV:[id number] DOB: 11-30-1955 DOA: 03/11/2021 PCP: Janora Norlander, DO     Brief Narrative:  Jasmine [name]  is a 66 y.o. WF PMHx Anemia, anxiety, chronic back pain, tobacco use disorder, COPD, history of lung cancer and lobectomy, GERD, chronic pancreatitis, seizures,   Presents to ED with a chief complaint of intractable abdominal pain and vomiting.  Patient reports that she had nausea, vomiting, epigastric pain, upper back pain, shoulder pain since yesterday.  In that time she has had 12-15 episodes of emesis. Denied hematemesis, poor appetite unable to tolerate p.o. liquids or solids.,  Denies any diarrhea or constipation, reporting chronic dysuria..Denies of having tearing like sensation.  Patient reports this feels very similar to her previous admission.     Last admission was also due to intractable nausea vomiting, was treated with antiemetics IV fluids, Protonix, IV analgesics..  No history of drug abuse including marijuana. Per patient at time she was suggested she has acute on chronic pancreatitis.       Patient is a current smoker and request nicotine patch.  She does not drink alcohol, does not use illicit drugs, and is vaccinated for COVID.  Patient is full code.    ED Stable vital signs mild tachycardia, CBC, BMP within normal limits  Lipase 21.   CT abdomen pelvis showed chronic pancreatitis with some ductal dilatation that was present previously.   Chest x-ray showed no active disease.   EKG showed a heart rate of 112, sinus tachycardia, QTC 416 with left atrial enlargement.    Patient was given Dilaudid, morphine, Protonix, Phenergan and 2 L bolus.    Subsequently was admitted for intractable nausea vomiting, pain, dehydration unable to tolerate p.o.     Subjective: Afebrile overnight.  A/O x4, negative postprandial N/V.  Continues to have pain between her shoulder blades.  Patient states he sees a chronic pain clinic in  [place].  Currently pain not completely controlled.   Assessment & Plan: Covid vaccination;   Principal Problem:   Intractable abdominal pain Active Problems:   Acute on chronic pancreatitis (HCC)   HTN (hypertension)   Chronic narcotic use   Tobacco abuse   GERD (gastroesophageal reflux disease)   Pressure injury of skin   Chronic pain syndrome  Acute on chronic pancreatitis - Appears to be resolving - Trend lipase Lab Results  Component Value Date   LIPASE 22 03/13/2021   LIPASE 21 03/11/2021   LIPASE 32 02/12/2021   LIPASE 27 10/10/2020   LIPASE 20 09/11/2020  -1/8 increase diet to full liquid. -Restart patient's home pain medication.  If patient tolerates DC IV pain medication -Cymbalta 90 mg daily - 1/8 OxyContin 15 mg BID - 1/8 Percocet 10 mg-325 5 x per day   -1/8 Zanaflex 6 mg TID  Intractable nausea vomiting -With history of chronic pancreatitis, lipase within normal limits, LFTs within normal limits .-CT abdomen pelvis: No acute finding including, chronic inflammation of the head of the pancreas -Continue IV fluids, status post 2 L bolus in ED, -We will continue antiemetics -1/8 DC Phenergan continue Zofran  Chest pain/back pain/chronic pain syndrome -Noncardiac -Describe the pain midsternal with radiation to the back exacerbated by belching, vomiting  -Intermittent with nausea vomiting -No indication of her symptoms being cardiac in nature -Likely esophagitis -Initiating scheduled Carafate, as needed Maalox -No changes in EKG, troponin negative, 13, 12 respectively -Pain consistent with acute on chronic pancreatitis.  Located directly between shoulder blades  and substernal.  COPD -Stable, not O2 dependent at baseline, on room air satting 97% -Continue Symbicort, DuoNeb bronchodilators as needed -1/8 titrate O2 to off - 1/8 ambulatory SPO2 pending.  Place findings in appropriate epic note for insurance purposes, notify physician when  complete  Hypokalemia - Potassium goal> 4 - 1/8 potassium IV 50 mEq   Hypomagnesemia  -Magnesium goal> 2 - 1/8 magnesium IV 3 g   Essential HTN -Amlodipine 10 mg daily - 1/8 lisinopril 2.5 mg daily   Hyperlipidemia -Continue statins   GERD -Continue PPI  Right buttocks ulcer  Pressure Injury 03/12/21 Buttocks Right;Lower Stage 1 -  Intact skin with non-blanchable redness of a localized area usually over a bony prominence. Redness, nonblanchable, intact skin (Active)  03/12/21 0230  Location: Buttocks  Location Orientation: Right;Lower  Staging: Stage 1 -  Intact skin with non-blanchable redness of a localized area usually over a bony prominence.  Wound Description (Comments): Redness, nonblanchable, intact skin  Present on Admission: Yes  -Out of bed to chair q shift         DVT prophylaxis: Subcu heparin Code Status: Full Family Communication:  Status is: Inpatient    Dispo: The patient is from: Home              Anticipated d/c is to: Home              Anticipated d/c date is: 2 days              Patient currently is not medically stable to d/c.      Consultants:    Procedures/Significant Events:    I have personally reviewed and interpreted all radiology studies and my findings are as above.  VENTILATOR SETTINGS:    Cultures   Antimicrobials:    Devices    LINES / TUBES:      Continuous Infusions:  magnesium sulfate bolus IVPB 2 g (03/13/21 1259)   Followed by   magnesium sulfate bolus IVPB     potassium chloride       Objective: Vitals:   03/12/21 2143 03/13/21 0452 03/13/21 0532 03/13/21 0741  BP: (!) 151/92 (!) 176/95 (!) 175/86   Pulse: 86 78 77   Resp: 19 18 18    Temp: 98.1 F (36.7 C) 97.7 F (36.5 C) 98 F (36.7 C)   TempSrc:      SpO2: 100% 100% 100% 100%  Weight:      Height:        Intake/Output Summary (Last 24 hours) at 03/13/2021 1338 Last data filed at 03/13/2021 1130 Gross per 24 hour  Intake  4823.57 ml  Output 3500 ml  Net 1323.57 ml   Filed Weights   03/11/21 2048  Weight: 46.3 kg    Examination:  General: A/O x4, No acute respiratory distress Eyes: negative scleral hemorrhage, negative anisocoria, negative icterus ENT: Negative Runny nose, negative gingival bleeding, Neck:  Negative scars, masses, torticollis, lymphadenopathy, JVD Lungs: Clear to auscultation bilaterally without wheezes or crackles Cardiovascular: Regular rate and rhythm without murmur gallop or rub normal S1 and S2 Abdomen: negative abdominal pain, nondistended, positive soft, bowel sounds, no rebound, no ascites, no appreciable mass Extremities: No significant cyanosis, clubbing, or edema bilateral lower extremities Skin: Negative rashes, lesions, ulcers Psychiatric:  Negative depression, negative anxiety, negative fatigue, negative mania  Central nervous system:  Cranial nerves II through XII intact, tongue/uvula midline, all extremities muscle strength 5/5, sensation intact throughout, negative dysarthria, negative expressive aphasia, negative receptive aphasia.  Marland Kitchen  Data Reviewed: Care during the described time interval was provided by me .  I have reviewed this patient's available data, including medical history, events of note, physical examination, and all test results as part of my evaluation.  CBC: Recent Labs  Lab 03/11/21 2053 03/12/21 0452 03/13/21 0948  WBC 4.7 4.7 4.3  NEUTROABS 3.3 3.2 2.1  HGB 11.7* 12.0 11.5*  HCT 38.3 40.3 38.3  MCV 104.9* 103.9* 105.8*  PLT 218 224 607   Basic Metabolic Panel: Recent Labs  Lab 03/11/21 2053 03/12/21 0452 03/13/21 0948  NA 138 138 136  K 3.9 3.7 3.3*  CL 102 96* 99  CO2 25 30 28   GLUCOSE 111* 122* 111*  BUN 7* 6* 7*  CREATININE 0.37* 0.44 0.36*  CALCIUM 9.2 8.9 8.5*  MG  --  1.6* 1.6*  PHOS  --   --  2.6   GFR: Estimated Creatinine Clearance: 49.9 mL/min (A) (by C-G formula based on SCr of 0.36 mg/dL (L)). Liver Function  Tests: Recent Labs  Lab 03/11/21 2053 03/12/21 0452 03/13/21 0948  AST 18 18 22   ALT 17 17 17   ALKPHOS 80 78 78  BILITOT 0.4 1.0 0.8  PROT 6.9 6.6 6.0*  ALBUMIN 3.7 3.5 3.2*   Recent Labs  Lab 03/11/21 2053 03/13/21 0948  LIPASE 21 22  AMYLASE  --  25*   No results for input(s): AMMONIA in the last 168 hours. Coagulation Profile: No results for input(s): INR, PROTIME in the last 168 hours. Cardiac Enzymes: No results for input(s): CKTOTAL, CKMB, CKMBINDEX, TROPONINI in the last 168 hours. BNP (last 3 results) No results for input(s): PROBNP in the last 8760 hours. HbA1C: No results for input(s): HGBA1C in the last 72 hours. CBG: No results for input(s): GLUCAP in the last 168 hours. Lipid Profile: No results for input(s): CHOL, HDL, LDLCALC, TRIG, CHOLHDL, LDLDIRECT in the last 72 hours. Thyroid Function Tests: No results for input(s): TSH, T4TOTAL, FREET4, T3FREE, THYROIDAB in the last 72 hours. Anemia Panel: No results for input(s): VITAMINB12, FOLATE, FERRITIN, TIBC, IRON, RETICCTPCT in the last 72 hours. Sepsis Labs: No results for input(s): PROCALCITON, LATICACIDVEN in the last 168 hours.  Recent Results (from the past 240 hour(s))  Resp Panel by RT-PCR (Flu A&B, Covid) Nasopharyngeal Swab     Status: None   Collection Time: 03/12/21 12:35 AM   Specimen: Nasopharyngeal Swab; Nasopharyngeal(NP) swabs in vial transport medium  Result Value Ref Range Status   SARS Coronavirus 2 by RT PCR NEGATIVE NEGATIVE Final    Comment: (NOTE) SARS-CoV-2 target nucleic acids are NOT DETECTED.  The SARS-CoV-2 RNA is generally detectable in upper respiratory specimens during the acute phase of infection. The lowest concentration of SARS-CoV-2 viral copies this assay can detect is 138 copies/mL. A negative result does not preclude SARS-Cov-2 infection and should not be used as the sole basis for treatment or other patient management decisions. A negative result may occur with   improper specimen collection/handling, submission of specimen other than nasopharyngeal swab, presence of viral mutation(s) within the areas targeted by this assay, and inadequate number of viral copies(<138 copies/mL). A negative result must be combined with clinical observations, patient history, and epidemiological information. The expected result is Negative.  Fact Sheet for Patients:  EntrepreneurPulse.com.au  Fact Sheet for Healthcare Providers:  IncredibleEmployment.be  This test is no t yet approved or cleared by the [place] FDA and  has been authorized for detection and/or diagnosis of SARS-CoV-2 by FDA under an  Emergency Use Authorization (EUA). This EUA will remain  in effect (meaning this test can be used) for the duration of the COVID-19 declaration under Section 564(b)(1) of the Act, 21 U.S.C.section 360bbb-3(b)(1), unless the authorization is terminated  or revoked sooner.       Influenza A by PCR NEGATIVE NEGATIVE Final   Influenza B by PCR NEGATIVE NEGATIVE Final    Comment: (NOTE) The Xpert Xpress SARS-CoV-2/FLU/RSV plus assay is intended as an aid in the diagnosis of influenza from Nasopharyngeal swab specimens and should not be used as a sole basis for treatment. Nasal washings and aspirates are unacceptable for Xpert Xpress SARS-CoV-2/FLU/RSV testing.  Fact Sheet for Patients: EntrepreneurPulse.com.au  Fact Sheet for Healthcare Providers: IncredibleEmployment.be  This test is not yet approved or cleared by the [place] FDA and has been authorized for detection and/or diagnosis of SARS-CoV-2 by FDA under an Emergency Use Authorization (EUA). This EUA will remain in effect (meaning this test can be used) for the duration of the COVID-19 declaration under Section 564(b)(1) of the Act, 21 U.S.C. section 360bbb-3(b)(1), unless the authorization is terminated  or revoked.  Performed at [facility], [address], [place], [place] [address]          Radiology Studies: CT ABDOMEN PELVIS W CONTRAST  Result Date: 03/11/2021 CLINICAL DATA:  Abdominal pain, acute, nonlocalized. Nausea, vomiting EXAM: CT ABDOMEN AND PELVIS WITH CONTRAST TECHNIQUE: Multidetector CT imaging of the abdomen and pelvis was performed using the standard protocol following bolus administration of intravenous contrast. CONTRAST:  133mL OMNIPAQUE IOHEXOL 300 MG/ML  SOLN COMPARISON:  11/26/2020 FINDINGS: Lower chest: No acute abnormality. Hepatobiliary: Gallbladder unremarkable. Stable intrahepatic and extrahepatic biliary ductal dilatation. No focal hepatic abnormality. Pancreas: Calcifications in the pancreatic head and uncinate process are similar to prior study. One calcification is in the region of the ampulla. Pancreatic duct is dilated, stable since prior study. Spleen: No focal abnormality.  Normal size. Adrenals/Urinary Tract: Small bilateral renal cysts appear benign and stable. No hydronephrosis. Adrenal glands and urinary bladder unremarkable. Stomach/Bowel: No evidence of bowel obstruction. Stomach, large and small bowel grossly unremarkable. Vascular/Lymphatic: Heavily calcified aorta and iliac vessels. No evidence of aneurysm or adenopathy. Reproductive: Prior hysterectomy.  No adnexal masses. Other: No free fluid or free air. Musculoskeletal: No acute bony abnormality. Postoperative changes in the lower lumbar spine. IMPRESSION: Trace right pleural effusion. Stable calcifications in the pancreatic head and in the region of the ampullary with associated biliary and pancreatic ductal dilatation. This could be further evaluated with nonemergent MRCP if felt clinically indicated. Stable bilateral renal cysts. Aortoiliac atherosclerosis. Electronically Signed   By: Rolm Baptise M.D.   On: 03/11/2021 23:19   DG Chest Port 1 View  Result Date: 03/11/2021 CLINICAL DATA:  Chest pain  with nausea vomit EXAM: PORTABLE CHEST 1 VIEW COMPARISON:  02/12/2021 FINDINGS: Small right pleural effusion or pleural thickening. Stable cardiomediastinal silhouette. Postsurgical changes in the right suprahilar lung. Emphysema with mild chronic bronchitic changes. IMPRESSION: No active disease.  Emphysema and postsurgical changes on the right. Electronically Signed   By: Donavan Foil M.D.   On: 03/11/2021 21:32        Scheduled Meds:  amLODipine  10 mg Oral Daily   atorvastatin  40 mg Oral Daily   DULoxetine  90 mg Oral Daily   heparin  5,000 Units Subcutaneous Q8H   lisinopril  2.5 mg Oral Daily   mometasone-formoterol  2 puff Inhalation BID   nicotine  14 mg Transdermal  Daily   oxybutynin  5 mg Oral QHS   oxyCODONE-acetaminophen  1 tablet Oral 5 X Daily   And   oxyCODONE  5 mg Oral 5 X Daily   oxyCODONE  15 mg Oral Q12H   pantoprazole  40 mg Oral Daily   sucralfate  1 g Oral TID WC & HS   tiZANidine  6 mg Oral TID   traZODone  50 mg Oral QHS   umeclidinium bromide  1 puff Inhalation Daily   Continuous Infusions:  magnesium sulfate bolus IVPB 2 g (03/13/21 1259)   Followed by   magnesium sulfate bolus IVPB     potassium chloride       LOS: 1 day    Time spent:40 min    Yadiel Aubry, Geraldo Docker, MD [facility]   If 7PM-7AM, please contact night-coverage 03/13/2021, 1:38 PM

## 2021-03-13 NOTE — Progress Notes (Signed)
Decreased rate of potassium to 50 ml/hr sue to pt intolerance. Pt still c/o of burning. Decreased to 45 ml/hr due to intolerance.

## 2021-03-13 NOTE — Progress Notes (Signed)
Changed rate of potassium to 50 ml/hr due to pt intolerance.

## 2021-03-14 DIAGNOSIS — K219 Gastro-esophageal reflux disease without esophagitis: Secondary | ICD-10-CM

## 2021-03-14 LAB — COMPREHENSIVE METABOLIC PANEL
ALT: 16 U/L (ref 0–44)
AST: 18 U/L (ref 15–41)
Albumin: 3.1 g/dL — ABNORMAL LOW (ref 3.5–5.0)
Alkaline Phosphatase: 67 U/L (ref 38–126)
Anion gap: 8 (ref 5–15)
BUN: 7 mg/dL — ABNORMAL LOW (ref 8–23)
CO2: 28 mmol/L (ref 22–32)
Calcium: 8.8 mg/dL — ABNORMAL LOW (ref 8.9–10.3)
Chloride: 99 mmol/L (ref 98–111)
Creatinine, Ser: 0.5 mg/dL (ref 0.44–1.00)
GFR, Estimated: >60 mL/min (ref >60)
Glucose, Bld: 94 mg/dL (ref 70–99)
Potassium: 3.6 mmol/L (ref 3.5–5.1)
Sodium: 135 mmol/L (ref 135–145)
Total Bilirubin: 0.5 mg/dL (ref 0.3–1.2)
Total Protein: 5.6 g/dL — ABNORMAL LOW (ref 6.5–8.1)

## 2021-03-14 LAB — CBC
HCT: 35.9 % — ABNORMAL LOW (ref 36.0–46.0)
Hemoglobin: 10.8 g/dL — ABNORMAL LOW (ref 12.0–15.0)
MCH: 32.1 pg (ref 26.0–34.0)
MCHC: 30.1 g/dL (ref 30.0–36.0)
MCV: 106.8 fL — ABNORMAL HIGH (ref 80.0–100.0)
Platelets: 167 10*3/uL (ref 150–400)
RBC: 3.36 MIL/uL — ABNORMAL LOW (ref 3.87–5.11)
RDW: 16.1 % — ABNORMAL HIGH (ref 11.5–15.5)
WBC: 4.1 10*3/uL (ref 4.0–10.5)
nRBC: 0 % (ref 0.0–0.2)

## 2021-03-14 LAB — MAGNESIUM: Magnesium: 2.1 mg/dL (ref 1.7–2.4)

## 2021-03-14 LAB — LIPASE, BLOOD: Lipase: 24 U/L (ref 11–51)

## 2021-03-14 LAB — PHOSPHORUS: Phosphorus: 3 mg/dL (ref 2.5–4.6)

## 2021-03-14 MED ORDER — AMLODIPINE BESYLATE 10 MG PO TABS: 10.0000 mg | ORAL_TABLET | Freq: Every day | ORAL | 1 refills | Status: DC

## 2021-03-14 MED ORDER — POLY-IRON 150 FORTE 150-25-1 MG-MCG-MG PO CAPS: 1.0000 | ORAL_CAPSULE | Freq: Every day | ORAL | 2 refills | Status: DC

## 2021-03-14 MED ORDER — POLYETHYLENE GLYCOL 3350 17 G PO PACK
17.0000 g | PACK | Freq: Every day | ORAL | 2 refills | Status: DC
Start: 2021-03-14 — End: 2021-08-07

## 2021-03-14 MED ORDER — LISINOPRIL 5 MG PO TABS: 5.0000 mg | ORAL_TABLET | Freq: Every day | ORAL | 1 refills | Status: DC

## 2021-03-14 NOTE — Care Management Important Message (Signed)
Important Message  Patient Details  Name: Jasmine [name] MRN: [id number] Date of Birth: 05/29/1955   Medicare Important Message Given:  Yes     [name] 03/14/2021, 1:27 PM

## 2021-03-14 NOTE — Evaluation (Signed)
Physical Therapy Evaluation Patient Details Name: Jasmine [name] MRN: [id number] DOB: 03/18/1955 Today's Date: 03/14/2021  History of Present Illness  Jasmine [name]  is a 66 y.o. female, with history of anemia, anxiety, chronic back pain, tobacco use disorder, COPD, history of lung cancer ectomy, GERD, pancreatitis, seizures, and more presents to ED with a chief complaint of intractable abdominal pain and vomiting.  Patient reports that she had nausea, vomiting, epigastric pain, upper back pain, shoulder pain since yesterday.  In that time she has had 12-15 episodes of emesis.  She denies hematemesis.  She had a decreased appetite.  Her last normal meal was yesterday.  Her last normal bowel movement was yesterday.  Patient denies any diarrhea or constipation.  She does have dysuria but this is chronic.  She has had no changes in her urine.  She reports no fever with a T-max of 100.0.  Reports the pain in her epigastrium feels like a constant stabbing.  It is worse with the cough.  Her cough is very normal smoker's cough.  Patient describes the pain between her shoulder blades as stabbing as well.  There is no radiation.  There is no tearing like sensation.  Patient reports this feels very similar to her previous admission.  At her last admission her discharge diagnosis was intractable abdominal pain and nausea.  She was advised to continue as needed antiemetics and analgesics.  Her symptoms were suggestive of acute on chronic pancreatitis, and she is advised to continue the use of pancreatic enzymes.  She was weaned off IV pain medications and started on long-acting OxyContin as well as advised to continue her Protonix.  Patient was discharged in stable condition.     Patient is a current smoker and request nicotine patch.  She does not drink alcohol, does not use illicit drugs, and is vaccinated for COVID.  Patient is full code.   Clinical Impression  Patient functioning at baseline for functional mobility  and gait demonstrating good return for bed mobility and taking a few steps at bedside.  Patient declined to attempt walking beyond bedside mostly due to self-limiting and mild agitation.  Plan:  Patient discharged from physical therapy to care of nursing for ambulation daily as tolerated for length of stay.         Recommendations for follow up therapy are one component of a multi-disciplinary discharge planning process, led by the attending physician.  Recommendations may be updated based on patient status, additional functional criteria and insurance authorization.  Follow Up Recommendations No PT follow up    Assistance Recommended at Discharge PRN  Patient can return home with the following  A little help with walking and/or transfers;A little help with bathing/dressing/bathroom    Equipment Recommendations None recommended by PT  Recommendations for Other Services       Functional Status Assessment Patient has not had a recent decline in their functional status     Precautions / Restrictions Precautions Precautions: None Restrictions Weight Bearing Restrictions: No      Mobility  Bed Mobility Overal bed mobility: Modified Independent                  Transfers Overall transfer level: Modified independent                      Ambulation/Gait Ambulation/Gait assistance: Supervision;Modified independent (Device/Increase time) Gait Distance (Feet): 4 Feet Assistive device: None Gait Pattern/deviations: Decreased step length - right;Decreased step length - left;Decreased stride  length Gait velocity: decreased     General Gait Details: demonstrates good return for taking a few steps at bedside, refused to attempt walking away from bedside mostly due to self-limiting  Stairs            Wheelchair Mobility    Modified Rankin (Stroke Patients Only)       Balance Overall balance assessment: No apparent balance deficits (not formally assessed)                                            Pertinent Vitals/Pain Pain Assessment: No/denies pain    Home Living Family/patient expects to be discharged to:: Private residence Living Arrangements: Spouse/significant other Available Help at Discharge: Family;Available 24 hours/day Type of Home: House Home Access: Stairs to enter Entrance Stairs-Rails: Right;Left;Can reach both Entrance Stairs-Number of Steps: 2 Alternate Level Stairs-Number of Steps: flight Home Layout: Able to live on main level with bedroom/bathroom;Two level Home Equipment: Rollator (4 wheels);Cane - single point;Shower seat;Grab bars - tub/shower;Grab bars - toilet;Wheelchair - manual;Hand held shower head Additional Comments: Has an AFO for prolong ambulation    Prior Function Prior Level of Function : Needs assist       Physical Assist : Mobility (physical) Mobility (physical): Bed mobility;Transfers;Gait;Stairs   Mobility Comments: Household ambulator using RW or SPC, for longer distances ues wheelchair ADLs Comments: assisted by family     Hand Dominance   Dominant Hand: Right    Extremity/Trunk Assessment   Upper Extremity Assessment Upper Extremity Assessment: Overall WFL for tasks assessed    Lower Extremity Assessment Lower Extremity Assessment: Overall WFL for tasks assessed    Cervical / Trunk Assessment Cervical / Trunk Assessment: Normal  Communication   Communication: No difficulties  Cognition Arousal/Alertness: Awake/alert Behavior During Therapy: WFL for tasks assessed/performed Overall Cognitive Status: Within Functional Limits for tasks assessed                                          General Comments      Exercises     Assessment/Plan    PT Assessment Patient does not need any further PT services  PT Problem List         PT Treatment Interventions      PT Goals (Current goals can be found in the Care Plan section)  Acute Rehab  PT Goals Patient Stated Goal: return home with family to assist PT Goal Formulation: With patient Time For Goal Achievement: 03/14/21 Potential to Achieve Goals: Good    Frequency       Co-evaluation               AM-PAC PT "6 Clicks" Mobility  Outcome Measure Help needed turning from your back to your side while in a flat bed without using bedrails?: None Help needed moving from lying on your back to sitting on the side of a flat bed without using bedrails?: None Help needed moving to and from a bed to a chair (including a wheelchair)?: None Help needed standing up from a chair using your arms (e.g., wheelchair or bedside chair)?: None Help needed to walk in hospital room?: A Little Help needed climbing 3-5 steps with a railing? : A Little 6 Click Score: 22    End of Session  Activity Tolerance: Patient tolerated treatment well;Treatment limited secondary to agitation Patient left: in bed Nurse Communication: Mobility status PT Visit Diagnosis: Unsteadiness on feet (R26.81);Other abnormalities of gait and mobility (R26.89);Muscle weakness (generalized) (M62.81)    Time: 9758-8325 PT Time Calculation (min) (ACUTE ONLY): 14 min   Charges:   PT Evaluation $PT Eval Low Complexity: 1 Low PT Treatments $Therapeutic Activity: 8-22 mins        12:03 PM, 03/14/21 Lonell Grandchild, MPT Physical Therapist with [facility] [phone] office [phone] mobile phone

## 2021-03-14 NOTE — Plan of Care (Signed)
°  Problem: Education: Goal: Knowledge of General Education information will improve Description: Including pain rating scale, medication(s)/side effects and non-pharmacologic comfort measures 03/14/2021 1244 by Melony Overly, RN Outcome: Adequate for Discharge 03/14/2021 1244 by Melony Overly, RN Outcome: Adequate for Discharge 03/14/2021 0830 by Melony Overly, RN Outcome: Progressing   Problem: Health Behavior/Discharge Planning: Goal: Ability to manage health-related needs will improve 03/14/2021 1244 by Melony Overly, RN Outcome: Adequate for Discharge 03/14/2021 1244 by Melony Overly, RN Outcome: Adequate for Discharge 03/14/2021 0830 by Melony Overly, RN Outcome: Progressing   Problem: Clinical Measurements: Goal: Ability to maintain clinical measurements within normal limits will improve 03/14/2021 1244 by Melony Overly, RN Outcome: Adequate for Discharge 03/14/2021 1244 by Melony Overly, RN Outcome: Adequate for Discharge 03/14/2021 0830 by Melony Overly, RN Outcome: Progressing Goal: Will remain free from infection 03/14/2021 1244 by Melony Overly, RN Outcome: Adequate for Discharge 03/14/2021 1244 by Melony Overly, RN Outcome: Adequate for Discharge 03/14/2021 0830 by Melony Overly, RN Outcome: Progressing Goal: Diagnostic test results will improve 03/14/2021 1244 by Melony Overly, RN Outcome: Adequate for Discharge 03/14/2021 1244 by Melony Overly, RN Outcome: Adequate for Discharge 03/14/2021 0830 by Melony Overly, RN Outcome: Progressing Goal: Respiratory complications will improve 03/14/2021 1244 by Melony Overly, RN Outcome: Adequate for Discharge 03/14/2021 0830 by Melony Overly, RN Outcome: Progressing Goal: Cardiovascular complication will be avoided 03/14/2021 1244 by Melony Overly, RN Outcome: Adequate for Discharge 03/14/2021 0830 by Melony Overly, RN Outcome: Progressing   Problem: Activity: Goal: Risk for activity intolerance will  decrease 03/14/2021 1244 by Melony Overly, RN Outcome: Adequate for Discharge 03/14/2021 0830 by Melony Overly, RN Outcome: Progressing   Problem: Nutrition: Goal: Adequate nutrition will be maintained 03/14/2021 1244 by Melony Overly, RN Outcome: Adequate for Discharge 03/14/2021 0830 by Melony Overly, RN Outcome: Progressing   Problem: Coping: Goal: Level of anxiety will decrease 03/14/2021 1244 by Melony Overly, RN Outcome: Adequate for Discharge 03/14/2021 0830 by Melony Overly, RN Outcome: Progressing   Problem: Elimination: Goal: Will not experience complications related to bowel motility 03/14/2021 1244 by Melony Overly, RN Outcome: Adequate for Discharge 03/14/2021 0830 by Melony Overly, RN Outcome: Progressing Goal: Will not experience complications related to urinary retention 03/14/2021 1244 by Melony Overly, RN Outcome: Adequate for Discharge 03/14/2021 0830 by Melony Overly, RN Outcome: Progressing   Problem: Pain Managment: Goal: General experience of comfort will improve 03/14/2021 1244 by Melony Overly, RN Outcome: Adequate for Discharge 03/14/2021 0830 by Melony Overly, RN Outcome: Progressing   Problem: Safety: Goal: Ability to remain free from injury will improve 03/14/2021 1244 by Melony Overly, RN Outcome: Adequate for Discharge 03/14/2021 0830 by Melony Overly, RN Outcome: Progressing   Problem: Skin Integrity: Goal: Risk for impaired skin integrity will decrease 03/14/2021 1244 by Melony Overly, RN Outcome: Adequate for Discharge 03/14/2021 0830 by Melony Overly, RN Outcome: Progressing

## 2021-03-14 NOTE — Progress Notes (Signed)
Informed patient MD wanted to titrate O2 to off per his 1/8 note and to ambulate. Patient stated she cannot go below 4 lpm [place]. She states this is what she is on at home and is not willing to go below this. Stated she would walk if she needed to but she would do it with the 4 lpm of O2.

## 2021-03-14 NOTE — Discharge Summary (Signed)
Physician Discharge Summary  Jasmine [name] WEX:[id number] DOB: 07/16/1955 DOA: 03/11/2021  PCP: Janora Norlander, DO  Admit date: 03/11/2021 Discharge date: 03/14/2021  Admitted From:  Home  Disposition: Home   Recommendations for Outpatient Follow-up:  Follow up with PCP in 1 weeks  Discharge Condition: STABLE   CODE STATUS: FULL DIET: soft foods diet recommended    Brief Hospitalization Summary: Please see all hospital notes, images, labs for full details of the hospitalization. ADMISSION HPI:   Jasmine [name]  is a 66 y.o. female, with history of anemia, anxiety, chronic back pain, tobacco use disorder, COPD, history of lung cancer ectomy, GERD, pancreatitis, seizures, and more presents to ED with a chief complaint of intractable abdominal pain and vomiting.  Patient reports that she had nausea, vomiting, epigastric pain, upper back pain, shoulder pain since yesterday.  In that time she has had 12-15 episodes of emesis.  She denies hematemesis.  She had a decreased appetite.  Her last normal meal was yesterday.  Her last normal bowel movement was yesterday.  Patient denies any diarrhea or constipation.  She does have dysuria but this is chronic.  She has had no changes in her urine.  She reports no fever with a T-max of 100.0.  Reports the pain in her epigastrium feels like a constant stabbing.  It is worse with the cough.  Her cough is very normal smoker's cough.  Patient describes the pain between her shoulder blades as stabbing as well.  There is no radiation.  There is no tearing like sensation.  Patient reports this feels very similar to her previous admission.  At her last admission her discharge diagnosis was intractable abdominal pain and nausea.  She was advised to continue as needed antiemetics and analgesics.  Her symptoms were suggestive of acute on chronic pancreatitis, and she is advised to continue the use of pancreatic enzymes.  She was weaned off IV pain medications and started on  long-acting OxyContin as well as advised to continue her Protonix.  Patient was discharged in stable condition.   Patient is a current smoker and request nicotine patch.  She does not drink alcohol, does not use illicit drugs, and is vaccinated for COVID.  Patient is full code.   In the ED Patient was tachycardic, respiratory rate was stable, blood pressure was hypertensive and she was satting 100% on her normal oxygen requirement.  She had no leukocytosis, hemoglobin stable.  Chemistry panel was unremarkable.  Lipase 21.  CT abdomen pelvis showed chronic pancreatitis with some ductal dilatation that was present previously.  Her chest x-ray showed no active disease.  Her EKG showed a heart rate of 112, sinus tachycardia, QTC 416 with left atrial enlargement.  Patient was given Dilaudid, morphine, Protonix, Phenergan and 2 L bolus.  Admission was requested for management of intractable abdominal pain  HOSPITAL COURSE  Intractable nausea vomiting - RESOLVED  -With history of chronic pancreatitis, lipase within normal limits, LFTs within normal limits .-CT abdomen pelvis: No acute finding including, chronic inflammation of the head of the pancreas -Continue IV fluids, status post 2 L bolus in ED, -We will continue antiemetics -1/8 DC Phenergan continue Zofran - ADVANCED TO SOFT DIET.  DC HOME    Chest pain/back pain/chronic pain syndrome -Noncardiac -Describe the pain midsternal with radiation to the back exacerbated by belching, vomiting  -Intermittent with nausea vomiting - RESOLVED NOW -No indication of her symptoms being cardiac in nature -Likely esophagitis -No changes in EKG, troponin negative,  13, 12 respectively -Pain consistent with acute on chronic pancreatitis.  Located directly between shoulder blades and substernal. - PT REPORTS BACK TO BASELINE TODAY. TOLERATING DIET AND WANTS TO GO HOME.    COPD -Stable, not O2 dependent at baseline, on room air satting 97% -Continue  Symbicort, DuoNeb bronchodilators as needed -1/8 titrate O2 to off - 1/8 ambulatory SPO2 pending.  Place findings in appropriate epic note for insurance purposes, notify physician when complete   Hypokalemia - REPLETED  - Potassium goal> 4 - 1/8 potassium IV 50 mEq   Hypomagnesemia - REPLETED  -Magnesium goal> 2 - 1/8 magnesium IV 3 g   Essential HTN -Amlodipine 10 mg daily - lisinopril 5 mg daily   Hyperlipidemia -Continue statins   GERD -Continue PPI   Right buttocks ulcer   Pressure Injury 03/12/21 Buttocks Right;Lower Stage 1 -  Intact skin with non-blanchable redness of a localized area usually over a bony prominence. Redness, nonblanchable, intact skin (Active)  03/12/21 0230  Location: Buttocks  Location Orientation: Right;Lower  Staging: Stage 1 -  Intact skin with non-blanchable redness of a localized area usually over a bony prominence.  Wound Description (Comments): Redness, nonblanchable, intact skin  Present on Admission: Yes  -Out of bed to chair q shift     Discharge Diagnoses:  Principal Problem:   Intractable abdominal pain Active Problems:   HTN (hypertension)   Chronic narcotic use   Tobacco abuse   GERD (gastroesophageal reflux disease)   Pressure injury of skin   Acute on chronic pancreatitis (HCC)   Chronic pain syndrome  Discharge Instructions:  Allergies as of 03/14/2021       Reactions   Lyrica [pregabalin] Other (See Comments)   EXTREME SHAKING/TREMBLING   Darvon [propoxyphene]    UNSPECIFIED REACTION    Gabapentin    Extreme shaking and trembling    Augmentin [amoxicillin-pot Clavulanate] Nausea And Vomiting   Has patient had a PCN reaction causing immediate rash, facial/tongue/throat swelling, SOB or lightheadedness with hypotension:no Has patient had a PCN reaction causing severe rash involving mucus membranes or skin necrosis:No Has patient had a PCN reaction that required hospitalization:No Has patient had a PCN reaction  occurring within the last 10 years:Yes--ONLY N/V If all of the above answers are "NO", then may proceed with Cephalosporin use.   Erythromycin Itching, Rash      Vibramycin [doxycycline Calcium] Itching, Rash   "[place] "        Medication List     STOP taking these medications    ipratropium-albuterol 0.5-2.5 (3) MG/3ML Soln Commonly known as: DUONEB       TAKE these medications    albuterol 108 (90 Base) MCG/ACT inhaler Commonly known as: VENTOLIN HFA Inhale into the lungs every 6 (six) hours as needed for wheezing or shortness of breath.   amLODipine 10 MG tablet Commonly known as: NORVASC Take 1 tablet (10 mg total) by mouth daily. Start taking on: March 15, 2021   atorvastatin 40 MG tablet Commonly known as: Lipitor Take 1 tablet (40 mg total) by mouth daily.   budesonide-formoterol 160-4.5 MCG/ACT inhaler Commonly known as: SYMBICORT USE 2 INHALATIONS TWICE A DAY   Cholecalciferol 125 MCG (5000 UT) capsule Take 5,000 Units by mouth daily.   dextromethorphan-guaiFENesin 30-600 MG 12hr tablet Commonly known as: MUCINEX DM Take 1 tablet by mouth 2 (two) times daily.   DULoxetine 30 MG capsule Commonly known as: CYMBALTA Take 3 capsules (90 mg total) by mouth daily.   Ensure  High Protein Liqd Drink 1 can 3 times daily.   lisinopril 5 MG tablet Commonly known as: ZESTRIL Take 1 tablet (5 mg total) by mouth daily. Start taking on: March 15, 2021   magnesium oxide 400 (241.3 Mg) MG tablet Commonly known as: MAG-OX Take 400 mg by mouth 2 (two) times daily.   Metanx 3-90.314-2-35 MG Caps TAKE 1 TABLET BY MOUTH TWO TIMES A DAY What changed:  when to take this reasons to take this   nicotine 14 mg/24hr patch Commonly known as: Nicoderm CQ Place 1 patch (14 mg total) onto the skin daily.   omeprazole 40 MG capsule Commonly known as: PRILOSEC Take one capsule once to twice daily before a meal.   ondansetron 4 MG disintegrating  tablet Commonly known as: Zofran ODT Take 1 tablet (4 mg total) by mouth every 8 (eight) hours as needed for nausea or vomiting.   oxybutynin 5 MG 24 hr tablet Commonly known as: Ditropan XL Take 1 tablet (5 mg total) by mouth at bedtime.   oxyCODONE-acetaminophen 10-325 MG tablet Commonly known as: PERCOCET Take 1 tablet by mouth 5 (five) times daily.   OxyCONTIN 15 mg 12 hr tablet Generic drug: oxyCODONE Take 15 mg by mouth every 12 (twelve) hours.   OXYGEN Inhale 4 L into the lungs See admin instructions. continuous   Poly-Iron 150 Forte 150-0.025-1 MG Caps Generic drug: Iron Polysacch Cmplx-B12-FA Take 1 capsule by mouth daily. What changed: when to take this   polyethylene glycol 17 g packet Commonly known as: MiraLax Take 17 g by mouth daily.   Spiriva Respimat 2.5 MCG/ACT Aers Generic drug: Tiotropium Bromide Monohydrate USE 2 INHALATIONS DAILY What changed: See the new instructions.   tizanidine 6 MG capsule Commonly known as: ZANAFLEX Take 6 mg by mouth 3 (three) times daily.   traZODone 150 MG tablet Commonly known as: DESYREL TAKE 1 TO 2 TABLETS AT BEDTIME What changed: See the new instructions.        Follow-up Information     Ronnie Doss M, DO. Schedule an appointment as soon as possible for a visit in 1 week(s).   Specialty: Family Medicine Why: Hospital Follow Up Contact information: [place] [address] [phone]                Allergies  Allergen Reactions   Lyrica [Pregabalin] Other (See Comments)    EXTREME SHAKING/TREMBLING   Darvon [Propoxyphene]     UNSPECIFIED REACTION    Gabapentin     Extreme shaking and trembling    Augmentin [Amoxicillin-Pot Clavulanate] Nausea And Vomiting    Has patient had a PCN reaction causing immediate rash, facial/tongue/throat swelling, SOB or lightheadedness with hypotension:no Has patient had a PCN reaction causing severe rash involving mucus membranes or skin  necrosis:No Has patient had a PCN reaction that required hospitalization:No Has patient had a PCN reaction occurring within the last 10 years:Yes--ONLY N/V If all of the above answers are "NO", then may proceed with Cephalosporin use.    Erythromycin Itching and Rash        Vibramycin [Doxycycline Calcium] Itching and Rash    "[place] "   Allergies as of 03/14/2021       Reactions   Lyrica [pregabalin] Other (See Comments)   EXTREME SHAKING/TREMBLING   Darvon [propoxyphene]    UNSPECIFIED REACTION    Gabapentin    Extreme shaking and trembling    Augmentin [amoxicillin-pot Clavulanate] Nausea And Vomiting   Has patient had  a PCN reaction causing immediate rash, facial/tongue/throat swelling, SOB or lightheadedness with hypotension:no Has patient had a PCN reaction causing severe rash involving mucus membranes or skin necrosis:No Has patient had a PCN reaction that required hospitalization:No Has patient had a PCN reaction occurring within the last 10 years:Yes--ONLY N/V If all of the above answers are "NO", then may proceed with Cephalosporin use.   Erythromycin Itching, Rash      Vibramycin [doxycycline Calcium] Itching, Rash   "[place] "        Medication List     STOP taking these medications    ipratropium-albuterol 0.5-2.5 (3) MG/3ML Soln Commonly known as: DUONEB       TAKE these medications    albuterol 108 (90 Base) MCG/ACT inhaler Commonly known as: VENTOLIN HFA Inhale into the lungs every 6 (six) hours as needed for wheezing or shortness of breath.   amLODipine 10 MG tablet Commonly known as: NORVASC Take 1 tablet (10 mg total) by mouth daily. Start taking on: March 15, 2021   atorvastatin 40 MG tablet Commonly known as: Lipitor Take 1 tablet (40 mg total) by mouth daily.   budesonide-formoterol 160-4.5 MCG/ACT inhaler Commonly known as: SYMBICORT USE 2 INHALATIONS TWICE A DAY   Cholecalciferol 125 MCG (5000 UT) capsule Take 5,000 Units  by mouth daily.   dextromethorphan-guaiFENesin 30-600 MG 12hr tablet Commonly known as: MUCINEX DM Take 1 tablet by mouth 2 (two) times daily.   DULoxetine 30 MG capsule Commonly known as: CYMBALTA Take 3 capsules (90 mg total) by mouth daily.   Ensure High Protein Liqd Drink 1 can 3 times daily.   lisinopril 5 MG tablet Commonly known as: ZESTRIL Take 1 tablet (5 mg total) by mouth daily. Start taking on: March 15, 2021   magnesium oxide 400 (241.3 Mg) MG tablet Commonly known as: MAG-OX Take 400 mg by mouth 2 (two) times daily.   Metanx 3-90.314-2-35 MG Caps TAKE 1 TABLET BY MOUTH TWO TIMES A DAY What changed:  when to take this reasons to take this   nicotine 14 mg/24hr patch Commonly known as: Nicoderm CQ Place 1 patch (14 mg total) onto the skin daily.   omeprazole 40 MG capsule Commonly known as: PRILOSEC Take one capsule once to twice daily before a meal.   ondansetron 4 MG disintegrating tablet Commonly known as: Zofran ODT Take 1 tablet (4 mg total) by mouth every 8 (eight) hours as needed for nausea or vomiting.   oxybutynin 5 MG 24 hr tablet Commonly known as: Ditropan XL Take 1 tablet (5 mg total) by mouth at bedtime.   oxyCODONE-acetaminophen 10-325 MG tablet Commonly known as: PERCOCET Take 1 tablet by mouth 5 (five) times daily.   OxyCONTIN 15 mg 12 hr tablet Generic drug: oxyCODONE Take 15 mg by mouth every 12 (twelve) hours.   OXYGEN Inhale 4 L into the lungs See admin instructions. continuous   Poly-Iron 150 Forte 150-0.025-1 MG Caps Generic drug: Iron Polysacch Cmplx-B12-FA Take 1 capsule by mouth daily. What changed: when to take this   polyethylene glycol 17 g packet Commonly known as: MiraLax Take 17 g by mouth daily.   Spiriva Respimat 2.5 MCG/ACT Aers Generic drug: Tiotropium Bromide Monohydrate USE 2 INHALATIONS DAILY What changed: See the new instructions.   tizanidine 6 MG capsule Commonly known as: ZANAFLEX Take 6 mg  by mouth 3 (three) times daily.   traZODone 150 MG tablet Commonly known as: DESYREL TAKE 1 TO 2 TABLETS AT BEDTIME What changed:  See the new instructions.        Procedures/Studies: CT Chest W Contrast  Result Date: 02/22/2021 CLINICAL DATA:  Non-small-cell lung cancer.  Restaging. EXAM: CT CHEST WITH CONTRAST TECHNIQUE: Multidetector CT imaging of the chest was performed during intravenous contrast administration. CONTRAST:  62mL OMNIPAQUE IOHEXOL 300 MG/ML  SOLN COMPARISON:  02/12/2021 FINDINGS: Cardiovascular: The heart size is normal. No substantial pericardial effusion. Coronary artery calcification is evident. Moderate atherosclerotic calcification is noted in the wall of the thoracic aorta. Mediastinum/Nodes: No mediastinal lymphadenopathy. There is no hilar lymphadenopathy. The esophagus has normal imaging features. There is no axillary lymphadenopathy. Lungs/Pleura: Centrilobular and paraseptal emphysema evident. Status post right upper lobectomy. Stable scarring right middle lobe 3 mm peripheral left upper lobe nodule on 36/4 is new in the interval interval increase in small right pleural effusion. Upper Abdomen: Similar intra and extrahepatic biliary duct dilatation. Diffuse dilatation of the main pancreatic duct again noted. Pancreatic head and ampulla have not been included on the study. Musculoskeletal: No worrisome lytic or sclerotic osseous abnormality. Thoracic spinal stimulator device evident. IMPRESSION: 1. Status post right upper lobectomy. No findings to suggest definite recurrent or metastatic disease in the chest. 2. Interval increase in small right pleural effusion. 3. 3 mm peripheral left upper lobe pulmonary nodule, new in the interval. Attention on follow-up recommended. 4. Similar intra and extrahepatic biliary duct dilatation with diffuse dilatation of the main pancreatic duct. Pancreatic head and ampulla have not been included on the study. CTA 11/26/2020 described a 7  mm calcification in the region of the ampulla. MRCP follow-up could be used to further evaluate as clinically warranted. 5. Aortic Atherosclerosis (ICD10-I70.0) and Emphysema (ICD10-J43.9). Electronically Signed   By: Misty Stanley M.D.   On: 02/22/2021 10:38   CT ABDOMEN PELVIS W CONTRAST  Result Date: 03/11/2021 CLINICAL DATA:  Abdominal pain, acute, nonlocalized. Nausea, vomiting EXAM: CT ABDOMEN AND PELVIS WITH CONTRAST TECHNIQUE: Multidetector CT imaging of the abdomen and pelvis was performed using the standard protocol following bolus administration of intravenous contrast. CONTRAST:  124mL OMNIPAQUE IOHEXOL 300 MG/ML  SOLN COMPARISON:  11/26/2020 FINDINGS: Lower chest: No acute abnormality. Hepatobiliary: Gallbladder unremarkable. Stable intrahepatic and extrahepatic biliary ductal dilatation. No focal hepatic abnormality. Pancreas: Calcifications in the pancreatic head and uncinate process are similar to prior study. One calcification is in the region of the ampulla. Pancreatic duct is dilated, stable since prior study. Spleen: No focal abnormality.  Normal size. Adrenals/Urinary Tract: Small bilateral renal cysts appear benign and stable. No hydronephrosis. Adrenal glands and urinary bladder unremarkable. Stomach/Bowel: No evidence of bowel obstruction. Stomach, large and small bowel grossly unremarkable. Vascular/Lymphatic: Heavily calcified aorta and iliac vessels. No evidence of aneurysm or adenopathy. Reproductive: Prior hysterectomy.  No adnexal masses. Other: No free fluid or free air. Musculoskeletal: No acute bony abnormality. Postoperative changes in the lower lumbar spine. IMPRESSION: Trace right pleural effusion. Stable calcifications in the pancreatic head and in the region of the ampullary with associated biliary and pancreatic ductal dilatation. This could be further evaluated with nonemergent MRCP if felt clinically indicated. Stable bilateral renal cysts. Aortoiliac atherosclerosis.  Electronically Signed   By: Rolm Baptise M.D.   On: 03/11/2021 23:19   DG Chest Port 1 View  Result Date: 03/11/2021 CLINICAL DATA:  Chest pain with nausea vomit EXAM: PORTABLE CHEST 1 VIEW COMPARISON:  02/12/2021 FINDINGS: Small right pleural effusion or pleural thickening. Stable cardiomediastinal silhouette. Postsurgical changes in the right suprahilar lung. Emphysema with mild chronic bronchitic changes. IMPRESSION:  No active disease.  Emphysema and postsurgical changes on the right. Electronically Signed   By: Donavan Foil M.D.   On: 03/11/2021 21:32     Subjective: Pt reports feeling much better today.  She wants diet advanced. She wants to go home. Pain is well controlled.   Discharge Exam: Vitals:   03/14/21 0538 03/14/21 0804  BP: (!) 146/76   Pulse: 77   Resp: 18   Temp: 98 F (36.7 C)   SpO2: 100% 99%   Vitals:   03/13/21 2027 03/13/21 2105 03/14/21 0538 03/14/21 0804  BP:  (!) 146/75 (!) 146/76   Pulse:  82 77   Resp:  19 18   Temp:  97.7 F (36.5 C) 98 F (36.7 C)   TempSrc:      SpO2: 100% 100% 100% 99%  Weight:      Height:       General: Pt is alert, awake, not in acute distress Cardiovascular: RRR, S1/S2 +, no rubs, no gallops Respiratory: CTA bilaterally, no wheezing, no rhonchi Abdominal: Soft, NT, ND, bowel sounds + Extremities: no edema, no cyanosis   The results of significant diagnostics from this hospitalization (including imaging, microbiology, ancillary and laboratory) are listed below for reference.     Microbiology: Recent Results (from the past 240 hour(s))  Resp Panel by RT-PCR (Flu A&B, Covid) Nasopharyngeal Swab     Status: None   Collection Time: 03/12/21 12:35 AM   Specimen: Nasopharyngeal Swab; Nasopharyngeal(NP) swabs in vial transport medium  Result Value Ref Range Status   SARS Coronavirus 2 by RT PCR NEGATIVE NEGATIVE Final    Comment: (NOTE) SARS-CoV-2 target nucleic acids are NOT DETECTED.  The SARS-CoV-2 RNA is generally  detectable in upper respiratory specimens during the acute phase of infection. The lowest concentration of SARS-CoV-2 viral copies this assay can detect is 138 copies/mL. A negative result does not preclude SARS-Cov-2 infection and should not be used as the sole basis for treatment or other patient management decisions. A negative result may occur with  improper specimen collection/handling, submission of specimen other than nasopharyngeal swab, presence of viral mutation(s) within the areas targeted by this assay, and inadequate number of viral copies(<138 copies/mL). A negative result must be combined with clinical observations, patient history, and epidemiological information. The expected result is Negative.  Fact Sheet for Patients:  EntrepreneurPulse.com.au  Fact Sheet for Healthcare Providers:  IncredibleEmployment.be  This test is no t yet approved or cleared by the [place] FDA and  has been authorized for detection and/or diagnosis of SARS-CoV-2 by FDA under an Emergency Use Authorization (EUA). This EUA will remain  in effect (meaning this test can be used) for the duration of the COVID-19 declaration under Section 564(b)(1) of the Act, 21 U.S.C.section 360bbb-3(b)(1), unless the authorization is terminated  or revoked sooner.       Influenza A by PCR NEGATIVE NEGATIVE Final   Influenza B by PCR NEGATIVE NEGATIVE Final    Comment: (NOTE) The Xpert Xpress SARS-CoV-2/FLU/RSV plus assay is intended as an aid in the diagnosis of influenza from Nasopharyngeal swab specimens and should not be used as a sole basis for treatment. Nasal washings and aspirates are unacceptable for Xpert Xpress SARS-CoV-2/FLU/RSV testing.  Fact Sheet for Patients: EntrepreneurPulse.com.au  Fact Sheet for Healthcare Providers: IncredibleEmployment.be  This test is not yet approved or cleared by the [place] FDA  and has been authorized for detection and/or diagnosis of SARS-CoV-2 by FDA under an Emergency Use Authorization (EUA).  This EUA will remain in effect (meaning this test can be used) for the duration of the COVID-19 declaration under Section 564(b)(1) of the Act, 21 U.S.C. section 360bbb-3(b)(1), unless the authorization is terminated or revoked.  Performed at [facility], [address] [place], [place] [address]      Labs: BNP (last 3 results) No results for input(s): BNP in the last 8760 hours. Basic Metabolic Panel: Recent Labs  Lab 03/11/21 2053 03/12/21 0452 03/13/21 0948 03/14/21 0402  NA 138 138 136 135  K 3.9 3.7 3.3* 3.6  CL 102 96* 99 99  CO2 25 30 28 28   GLUCOSE 111* 122* 111* 94  BUN 7* 6* 7* 7*  CREATININE 0.37* 0.44 0.36* 0.50  CALCIUM 9.2 8.9 8.5* 8.8*  MG  --  1.6* 1.6* 2.1  PHOS  --   --  2.6 3.0   Liver Function Tests: Recent Labs  Lab 03/11/21 2053 03/12/21 0452 03/13/21 0948 03/14/21 0402  AST 18 18 22 18   ALT 17 17 17 16   ALKPHOS 80 78 78 67  BILITOT 0.4 1.0 0.8 0.5  PROT 6.9 6.6 6.0* 5.6*  ALBUMIN 3.7 3.5 3.2* 3.1*   Recent Labs  Lab 03/11/21 2053 03/13/21 0948 03/14/21 0402  LIPASE 21 22 24   AMYLASE  --  25*  --    No results for input(s): AMMONIA in the last 168 hours. CBC: Recent Labs  Lab 03/11/21 2053 03/12/21 0452 03/13/21 0948 03/14/21 0402  WBC 4.7 4.7 4.3 4.1  NEUTROABS 3.3 3.2 2.1  --   HGB 11.7* 12.0 11.5* 10.8*  HCT 38.3 40.3 38.3 35.9*  MCV 104.9* 103.9* 105.8* 106.8*  PLT 218 224 175 167   Cardiac Enzymes: No results for input(s): CKTOTAL, CKMB, CKMBINDEX, TROPONINI in the last 168 hours. BNP: Invalid input(s): POCBNP CBG: No results for input(s): GLUCAP in the last 168 hours. D-Dimer No results for input(s): DDIMER in the last 72 hours. Hgb A1c No results for input(s): HGBA1C in the last 72 hours. Lipid Profile No results for input(s): CHOL, HDL, LDLCALC, TRIG, CHOLHDL, LDLDIRECT in the last 72  hours. Thyroid function studies No results for input(s): TSH, T4TOTAL, T3FREE, THYROIDAB in the last 72 hours.  Invalid input(s): FREET3 Anemia work up No results for input(s): VITAMINB12, FOLATE, FERRITIN, TIBC, IRON, RETICCTPCT in the last 72 hours. Urinalysis    Component Value Date/Time   COLORURINE STRAW (A) 03/12/2021 0600   APPEARANCEUR CLEAR 03/12/2021 0600   APPEARANCEUR Clear 07/26/2017 1636   LABSPEC 1.015 03/12/2021 0600   PHURINE 7.0 03/12/2021 0600   GLUCOSEU NEGATIVE 03/12/2021 0600   HGBUR NEGATIVE 03/12/2021 0600   BILIRUBINUR NEGATIVE 03/12/2021 0600   BILIRUBINUR Negative 07/26/2017 1636   KETONESUR 20 (A) 03/12/2021 0600   PROTEINUR 30 (A) 03/12/2021 0600   NITRITE NEGATIVE 03/12/2021 0600   LEUKOCYTESUR NEGATIVE 03/12/2021 0600   Sepsis Labs Invalid input(s): PROCALCITONIN,  WBC,  LACTICIDVEN Microbiology Recent Results (from the past 240 hour(s))  Resp Panel by RT-PCR (Flu A&B, Covid) Nasopharyngeal Swab     Status: None   Collection Time: 03/12/21 12:35 AM   Specimen: Nasopharyngeal Swab; Nasopharyngeal(NP) swabs in vial transport medium  Result Value Ref Range Status   SARS Coronavirus 2 by RT PCR NEGATIVE NEGATIVE Final    Comment: (NOTE) SARS-CoV-2 target nucleic acids are NOT DETECTED.  The SARS-CoV-2 RNA is generally detectable in upper respiratory specimens during the acute phase of infection. The lowest concentration of SARS-CoV-2 viral copies this assay can detect  is 138 copies/mL. A negative result does not preclude SARS-Cov-2 infection and should not be used as the sole basis for treatment or other patient management decisions. A negative result may occur with  improper specimen collection/handling, submission of specimen other than nasopharyngeal swab, presence of viral mutation(s) within the areas targeted by this assay, and inadequate number of viral copies(<138 copies/mL). A negative result must be combined with clinical observations,  patient history, and epidemiological information. The expected result is Negative.  Fact Sheet for Patients:  EntrepreneurPulse.com.au  Fact Sheet for Healthcare Providers:  IncredibleEmployment.be  This test is no t yet approved or cleared by the [place] FDA and  has been authorized for detection and/or diagnosis of SARS-CoV-2 by FDA under an Emergency Use Authorization (EUA). This EUA will remain  in effect (meaning this test can be used) for the duration of the COVID-19 declaration under Section 564(b)(1) of the Act, 21 U.S.C.section 360bbb-3(b)(1), unless the authorization is terminated  or revoked sooner.       Influenza A by PCR NEGATIVE NEGATIVE Final   Influenza B by PCR NEGATIVE NEGATIVE Final    Comment: (NOTE) The Xpert Xpress SARS-CoV-2/FLU/RSV plus assay is intended as an aid in the diagnosis of influenza from Nasopharyngeal swab specimens and should not be used as a sole basis for treatment. Nasal washings and aspirates are unacceptable for Xpert Xpress SARS-CoV-2/FLU/RSV testing.  Fact Sheet for Patients: EntrepreneurPulse.com.au  Fact Sheet for Healthcare Providers: IncredibleEmployment.be  This test is not yet approved or cleared by the [place] FDA and has been authorized for detection and/or diagnosis of SARS-CoV-2 by FDA under an Emergency Use Authorization (EUA). This EUA will remain in effect (meaning this test can be used) for the duration of the COVID-19 declaration under Section 564(b)(1) of the Act, 21 U.S.C. section 360bbb-3(b)(1), unless the authorization is terminated or revoked.  Performed at [facility], [address], [place], [place] [address]    Time coordinating discharge: 35 mins   SIGNED:  Irwin Brakeman, MD  [facility] 03/14/2021, 12:13 PM How to contact the [facility] Attending or Consulting provider [place] or covering provider during after  hours [place], for this patient?  Check the care team in [facility] and look for a) attending/consulting TRH provider listed and b) the [facility] team listed Log into [email or web address] and use [facility]'s universal password to access. If you do not have the password, please contact the hospital operator. Locate the [facility] provider you are looking for under [facility] and page to a number that you can be directly reached. If you still have difficulty reaching the provider, please page the [facility] (Director on Call) for the Hospitalists listed on amion for assistance.

## 2021-03-14 NOTE — Plan of Care (Signed)

## 2021-03-14 NOTE — Discharge Instructions (Signed)
IMPORTANT INFORMATION: PAY CLOSE ATTENTION  ° °PHYSICIAN DISCHARGE INSTRUCTIONS ° °Follow with Primary care provider  Gottschalk, Ashly M, DO  and other consultants as instructed by your Hospitalist Physician ° °SEEK MEDICAL CARE OR RETURN TO EMERGENCY ROOM IF SYMPTOMS COME BACK, WORSEN OR NEW PROBLEM DEVELOPS  ° °Please note: °You were cared for by a hospitalist during your hospital stay. Every effort will be made to forward records to your primary care provider.  You can request that your primary care provider send for your hospital records if they have not received them.  Once you are discharged, your primary care physician will handle any further medical issues. Please note that NO REFILLS for any discharge medications will be authorized once you are discharged, as it is imperative that you return to your primary care physician (or establish a relationship with a primary care physician if you do not have one) for your post hospital discharge needs so that they can reassess your need for medications and monitor your lab values. ° °Please get a complete blood count and chemistry panel checked by your Primary MD at your next visit, and again as instructed by your Primary MD. ° °Get Medicines reviewed and adjusted: °Please take all your medications with you for your next visit with your Primary MD ° °Laboratory/radiological data: °Please request your Primary MD to go over all hospital tests and procedure/radiological results at the follow up, please ask your primary care provider to get all Hospital records sent to his/her office. ° °In some cases, they will be blood work, cultures and biopsy results pending at the time of your discharge. Please request that your primary care provider follow up on these results. ° °If you are diabetic, please bring your blood sugar readings with you to your follow up appointment with primary care.   ° °Please call and make your follow up appointments as soon as possible.   ° °Also  Note the following: °If you experience worsening of your admission symptoms, develop shortness of breath, life threatening emergency, suicidal or homicidal thoughts you must seek medical attention immediately by calling 911 or calling your MD immediately  if symptoms less severe. ° °You must read complete instructions/literature along with all the possible adverse reactions/side effects for all the Medicines you take and that have been prescribed to you. Take any new Medicines after you have completely understood and accpet all the possible adverse reactions/side effects.  ° °Do not drive when taking Pain medications or sleeping medications (Benzodiazepines) ° °Do not take more than prescribed Pain, Sleep and Anxiety Medications. It is not advisable to combine anxiety,sleep and pain medications without talking with your primary care practitioner ° °Special Instructions: If you have smoked or chewed Tobacco  in the last 2 yrs please stop smoking, stop any regular Alcohol  and or any Recreational drug use. ° °Wear Seat belts while driving.  Do not drive if taking any narcotic, mind altering or controlled substances or recreational drugs or alcohol.  ° ° ° ° ° °

## 2021-03-14 NOTE — Progress Notes (Signed)
°  Transition of Care [facility]) Screening Note   Patient Details  Name: Jasmine [name] Date of Birth: May 30, 1955   Transition of Care [facility]) CM/SW Contact:    Boneta Lucks, RN Phone Number: 03/14/2021, 11:21 AM    Transition of Care Department [facility]) has reviewed patient and no TOC needs have been identified at this time. We will continue to monitor patient advancement through interdisciplinary progression rounds. If new patient transition needs arise, please place a TOC consult.

## 2021-03-16 ENCOUNTER — Telehealth: Payer: Self-pay

## 2021-03-16 NOTE — Telephone Encounter (Signed)
Transition Care Management Follow-up Telephone Call Date of discharge and from where: 03/14/21 [facility] - intractable abdominal pain - pancreatitis How have you been since you were released from the hospital? Seems to be doing better, just tired Any questions or concerns? No  Items Reviewed: Did the pt receive and understand the discharge instructions provided? Yes  Medications obtained and verified? Yes  Other? No  Any new allergies since your discharge? No  Dietary orders reviewed? Yes Do you have support at home? Yes   Home Care and Equipment/Supplies: Were home health services ordered? no If so, what is the name of the agency? N/A  Has the agency set up a time to come to the patient's home? no Were any new equipment or medical supplies ordered?  No What is the name of the medical supply agency? N/A Were you able to get the supplies/equipment? not applicable Do you have any questions related to the use of the equipment or supplies? No  Functional Questionnaire: (I = Independent and D = Dependent) ADLs: I  Bathing/Dressing- I  Meal Prep- D  Eating- I  Maintaining continence- I  Transferring/Ambulation- I  Managing Meds- D  Follow up appointments reviewed:  PCP Hospital f/u appt confirmed?  Husband is going to speak with her when she wakes up to find a good time to come in. I let him know Lajuana Ripple is booked for the next couple of weeks, but she should be seen this week if possible and Shelah Lewandowsky and Sharyn Lull have some openings   [facility] f/u appt confirmed? No   Are transportation arrangements needed? No  If their condition worsens, is the pt aware to call PCP or go to the Emergency Dept.? Yes Was the patient provided with contact information for the PCP's office or ED? Yes Was to pt encouraged to call back with questions or concerns? Yes

## 2021-03-16 NOTE — Telephone Encounter (Signed)
Transition Care Management Unsuccessful Follow-up Telephone Call  Date of discharge and from where[name] - pancreatitis  Attempts:  1st Attempt  Reason for unsuccessful TCM follow-up call:  Left voice message  Transition Care Management Unsuccessful Follow-up Telephone Call  Date of discharge and from where:  [name] 03[facility]14/[facility] - pancreatitis  Attempts:  2nd Attempt  Reason for unsuccessful TCM follow-up call:  No answer/busy no voice mail set up

## 2021-03-30 ENCOUNTER — Other Ambulatory Visit: Payer: Self-pay | Admitting: Family Medicine

## 2021-03-30 ENCOUNTER — Ambulatory Visit: Payer: Medicare Other | Admitting: Family Medicine

## 2021-03-30 ENCOUNTER — Telehealth: Payer: Self-pay | Admitting: Family Medicine

## 2021-03-30 NOTE — Telephone Encounter (Signed)
Spoke with patient, appointment scheduled for surgical clearance.

## 2021-04-01 ENCOUNTER — Ambulatory Visit (INDEPENDENT_AMBULATORY_CARE_PROVIDER_SITE_OTHER): Payer: Medicare Other

## 2021-04-01 VITALS — Ht 63.0 in | Wt 102.0 lb

## 2021-04-01 DIAGNOSIS — Z0000 Encounter for general adult medical examination without abnormal findings: Secondary | ICD-10-CM | POA: Diagnosis not present

## 2021-04-01 NOTE — Patient Instructions (Signed)
[name] , Thank you for taking time to come for your Medicare Wellness Visit. I appreciate your ongoing commitment to your health goals. Please review the following plan we discussed and let me know if I can assist you in the future.   Screening recommendations/referrals: Colonoscopy: Done 06/14/2020 - Repeat in 3 years Mammogram: Done 01/10/2021 -Repeat annually Bone Density: Done 07/12/2020 - Repeat every 2 years  Recommended yearly ophthalmology/optometry visit for glaucoma screening and checkup Recommended yearly dental visit for hygiene and checkup  Vaccinations: Influenza vaccine: Done 01/10/2021 - Repeat annually  Pneumococcal vaccine: Done 08/20/2019 & 11/05/2020 Tdap vaccine: Done 05/29/2020 - Repeat in 10 years Shingles vaccine: Done 03/30/2020 - get second dose in next 6 months   Covid-19: Done 05/29/2019, 06/29/2019, & 03/30/2020  Advanced directives: Please bring a copy of your health care power of attorney and living will to the office to be added to your chart at your convenience.  Conditions/risks identified: Aim for 30 minutes of exercise each day, drink 6-8 glasses of water and eat lots of fruits and vegetables.   Next appointment: Follow up in one year for your annual wellness visit    Preventive Care 65 Years and Older, Female Preventive care refers to lifestyle choices and visits with your health care provider that can promote health and wellness. What does preventive care include? A yearly physical exam. This is also called an annual well check. Dental exams once or twice a year. Routine eye exams. Ask your health care provider how often you should have your eyes checked. Personal lifestyle choices, including: Daily care of your teeth and gums. Regular physical activity. Eating a healthy diet. Avoiding tobacco and drug use. Limiting alcohol use. Practicing safe sex. Taking low-dose aspirin every day. Taking vitamin and mineral supplements as recommended by your  health care provider. What happens during an annual well check? The services and screenings done by your health care provider during your annual well check will depend on your age, overall health, lifestyle risk factors, and family history of disease. Counseling  Your health care provider may ask you questions about your: Alcohol use. Tobacco use. Drug use. Emotional well-being. Home and relationship well-being. Sexual activity. Eating habits. History of falls. Memory and ability to understand (cognition). Work and work Statistician. Reproductive health. Screening  You may have the following tests or measurements: Height, weight, and BMI. Blood pressure. Lipid and cholesterol levels. These may be checked every 5 years, or more frequently if you are over 45 years old. Skin check. Lung cancer screening. You may have this screening every year starting at age 56 if you have a 30-pack-year history of smoking and currently smoke or have quit within the past 15 years. Fecal occult blood test (FOBT) of the stool. You may have this test every year starting at age 62. Flexible sigmoidoscopy or colonoscopy. You may have a sigmoidoscopy every 5 years or a colonoscopy every 10 years starting at age 30. Hepatitis C blood test. Hepatitis B blood test. Sexually transmitted disease (STD) testing. Diabetes screening. This is done by checking your blood sugar (glucose) after you have not eaten for a while (fasting). You may have this done every 1-3 years. Bone density scan. This is done to screen for osteoporosis. You may have this done starting at age 1. Mammogram. This may be done every 1-2 years. Talk to your health care provider about how often you should have regular mammograms. Talk with your health care provider about your test results, treatment  options, and if necessary, the need for more tests. Vaccines  Your health care provider may recommend certain vaccines, such as: Influenza vaccine.  This is recommended every year. Tetanus, diphtheria, and acellular pertussis (Tdap, Td) vaccine. You may need a Td booster every 10 years. Zoster vaccine. You may need this after age 49. Pneumococcal 13-valent conjugate (PCV13) vaccine. One dose is recommended after age 91. Pneumococcal polysaccharide (PPSV23) vaccine. One dose is recommended after age 96. Talk to your health care provider about which screenings and vaccines you need and how often you need them. This information is not intended to replace advice given to you by your health care provider. Make sure you discuss any questions you have with your health care provider. Document Released: 03/19/2015 Document Revised: 11/10/2015 Document Reviewed: 12/22/2014 Elsevier Interactive Patient Education  2017 [place] Prevention in the Home Falls can cause injuries. They can happen to people of all ages. There are many things you can do to make your home safe and to help prevent falls. What can I do on the outside of my home? Regularly fix the edges of walkways and driveways and fix any cracks. Remove anything that might make you trip as you walk through a door, such as a raised step or threshold. Trim any bushes or trees on the path to your home. Use bright outdoor lighting. Clear any walking paths of anything that might make someone trip, such as rocks or tools. Regularly check to see if handrails are loose or broken. Make sure that both sides of any steps have handrails. Any raised decks and porches should have guardrails on the edges. Have any leaves, snow, or ice cleared regularly. Use sand or salt on walking paths during winter. Clean up any spills in your garage right away. This includes oil or grease spills. What can I do in the bathroom? Use night lights. Install grab bars by the toilet and in the tub and shower. Do not use towel bars as grab bars. Use non-skid mats or decals in the tub or shower. If you need to sit  down in the shower, use a plastic, non-slip stool. Keep the floor dry. Clean up any water that spills on the floor as soon as it happens. Remove soap buildup in the tub or shower regularly. Attach bath mats securely with double-sided non-slip rug tape. Do not have throw rugs and other things on the floor that can make you trip. What can I do in the bedroom? Use night lights. Make sure that you have a light by your bed that is easy to reach. Do not use any sheets or blankets that are too big for your bed. They should not hang down onto the floor. Have a firm chair that has side arms. You can use this for support while you get dressed. Do not have throw rugs and other things on the floor that can make you trip. What can I do in the kitchen? Clean up any spills right away. Avoid walking on wet floors. Keep items that you use a lot in easy-to-reach places. If you need to reach something above you, use a strong step stool that has a grab bar. Keep electrical cords out of the way. Do not use floor polish or wax that makes floors slippery. If you must use wax, use non-skid floor wax. Do not have throw rugs and other things on the floor that can make you trip. What can I do with my stairs? Do not  leave any items on the stairs. Make sure that there are handrails on both sides of the stairs and use them. Fix handrails that are broken or loose. Make sure that handrails are as long as the stairways. Check any carpeting to make sure that it is firmly attached to the stairs. Fix any carpet that is loose or worn. Avoid having throw rugs at the top or bottom of the stairs. If you do have throw rugs, attach them to the floor with carpet tape. Make sure that you have a light switch at the top of the stairs and the bottom of the stairs. If you do not have them, ask someone to add them for you. What else can I do to help prevent falls? Wear shoes that: Do not have high heels. Have rubber bottoms. Are  comfortable and fit you well. Are closed at the toe. Do not wear sandals. If you use a stepladder: Make sure that it is fully opened. Do not climb a closed stepladder. Make sure that both sides of the stepladder are locked into place. Ask someone to hold it for you, if possible. Clearly mark and make sure that you can see: Any grab bars or handrails. First and last steps. Where the edge of each step is. Use tools that help you move around (mobility aids) if they are needed. These include: Canes. Walkers. Scooters. Crutches. Turn on the lights when you go into a dark area. Replace any light bulbs as soon as they burn out. Set up your furniture so you have a clear path. Avoid moving your furniture around. If any of your floors are uneven, fix them. If there are any pets around you, be aware of where they are. Review your medicines with your doctor. Some medicines can make you feel dizzy. This can increase your chance of falling. Ask your doctor what other things that you can do to help prevent falls. This information is not intended to replace advice given to you by your health care provider. Make sure you discuss any questions you have with your health care provider. Document Released: 12/17/2008 Document Revised: 07/29/2015 Document Reviewed: 03/27/2014 Elsevier Interactive Patient Education  2017 [employer].

## 2021-04-01 NOTE — Progress Notes (Signed)
Subjective:   Jasmine [name] is a 66 y.o. female who presents for Medicare Annual (Subsequent) preventive examination.  Virtual Visit via Telephone Note  I connected with  VAANYA SHAMBAUGH on 04/01/21 at  2:45 PM EST by telephone and verified that I am speaking with the correct person using two identifiers.  Location: Patient: Home Provider: [facility] Persons participating in the virtual visit: patient/Nurse Health Advisor   I discussed the limitations, risks, security and privacy concerns of performing an evaluation and management service by telephone and the availability of in person appointments. The patient expressed understanding and agreed to proceed.  Interactive audio and video telecommunications were attempted between this nurse and patient, however failed, due to patient having technical difficulties OR patient did not have access to video capability.  We continued and completed visit with audio only.  Some vital signs may be absent or patient reported.   Brailyn Killion E Xiomar Crompton, LPN   Review of Systems     Cardiac Risk Factors include: advanced age (>74men, >36 women);sedentary lifestyle;smoking/ tobacco exposure;Other (see comment);hypertension, Risk factor comments: malnutrition, lung cancer, chronic respiratory failure, COPD, atherosclerosis, pancreatitis     Objective:    Today's Vitals   04/01/21 1446 04/01/21 1447  Weight: 102 lb (46.3 kg)   Height: 5\' 3"  (1.6 m)   PainSc:  9    Body mass index is 18.07 kg/m.  Advanced Directives 04/01/2021 03/12/2021 03/11/2021 03/09/2021 03/02/2021 02/24/2021 02/12/2021  Does Patient Have a Medical Advance Directive? Yes - Yes Yes Yes Yes Yes  Type of Paramedic of [place];Living will Living will;Healthcare Power of Attorney Living will;Healthcare Power of Attorney Living will;Healthcare Power of Attorney Living will;Healthcare Power of [place];Living will [place];Living will  Does patient want to make changes to medical advance directive? No - Patient declined No - Patient declined - No - Patient declined No - Patient declined No - Patient declined No - Patient declined  Copy of [place] in Chart? No - copy requested No - copy requested No - copy requested No - copy requested - No - copy requested No - copy requested  Would patient like information on creating a medical advance directive? - - - - - - -    Current Medications (verified) Outpatient Encounter Medications as of 04/01/2021  Medication Sig   albuterol (VENTOLIN HFA) 108 (90 Base) MCG/ACT inhaler USE 2 INHALATIONS EVERY 6 HOURS AS NEEDED FOR WHEEZING OR SHORTNESS OF BREATH (NEED TO BE SEEN BEFORE NEXT REFILL)   amLODipine (NORVASC) 10 MG tablet Take 1 tablet (10 mg total) by mouth daily.   atorvastatin (LIPITOR) 40 MG tablet Take 1 tablet (40 mg total) by mouth daily.   budesonide-formoterol (SYMBICORT) 160-4.5 MCG/ACT inhaler USE 2 INHALATIONS TWICE A DAY   Cholecalciferol 125 MCG (5000 UT) capsule Take 5,000 Units by mouth daily.   dextromethorphan-guaiFENesin (MUCINEX DM) 30-600 MG 12hr tablet Take 1 tablet by mouth 2 (two) times daily.   DULoxetine (CYMBALTA) 30 MG capsule Take 3 capsules (90 mg total) by mouth daily.   Iron Polysacch Cmplx-B12-FA (POLY-IRON 150 FORTE) 150-0.025-1 MG CAPS Take 1 capsule by mouth daily.   L-Methylfolate-Algae-B12-B6 (METANX) 3-90.314-2-35 MG CAPS TAKE 1 TABLET BY MOUTH TWO TIMES A DAY (Patient taking differently: Take 1 tablet by mouth daily as needed.)   lisinopril (ZESTRIL) 5 MG tablet Take 1 tablet (5 mg total) by mouth daily.   magnesium oxide (MAG-OX) 400 (241.3 Mg)  MG tablet Take 400 mg by mouth 2 (two) times daily.   nicotine (NICODERM CQ) 14 mg/24hr patch Place 1 patch (14 mg total) onto the skin daily.   Nutritional Supplements (ENSURE HIGH PROTEIN) LIQD Drink 1 can 3 times daily.   omeprazole (PRILOSEC) 40 MG capsule  Take one capsule once to twice daily before a meal.   ondansetron (ZOFRAN ODT) 4 MG disintegrating tablet Take 1 tablet (4 mg total) by mouth every 8 (eight) hours as needed for nausea or vomiting.   oxybutynin (DITROPAN XL) 5 MG 24 hr tablet Take 1 tablet (5 mg total) by mouth at bedtime.   oxyCODONE-acetaminophen (PERCOCET) 10-325 MG tablet Take 1 tablet by mouth 5 (five) times daily.   OXYCONTIN 15 MG 12 hr tablet Take 15 mg by mouth every 12 (twelve) hours.   OXYGEN Inhale 4 L into the lungs See admin instructions. continuous   polyethylene glycol (MIRALAX) 17 g packet Take 17 g by mouth daily.   SPIRIVA RESPIMAT 2.5 MCG/ACT AERS USE 2 INHALATIONS DAILY (Patient taking differently: Inhale 2 puffs into the lungs daily.)   tizanidine (ZANAFLEX) 6 MG capsule Take 6 mg by mouth 3 (three) times daily.   traZODone (DESYREL) 150 MG tablet TAKE 1 TO 2 TABLETS AT BEDTIME   No facility-administered encounter medications on file as of 04/01/2021.    Allergies (verified) Lyrica [pregabalin], Darvon [propoxyphene], Gabapentin, Augmentin [amoxicillin-pot clavulanate], Erythromycin, and Vibramycin [doxycycline calcium]   History: Past Medical History:  Diagnosis Date   Allergy    Anemia    Anxiety    Arthritis    Asthma    Carpal tunnel syndrome    Chronic back pain    Cigarette smoker 07/14/2583   Complication of anesthesia    in the past has had N/V, the last few surgeries she has not been   COPD (chronic obstructive pulmonary disease) ([place])    Easy bruising 07/10/2016   GERD (gastroesophageal reflux disease)    Headache    Heart murmur 2005   never had any problems   History of blood transfusion 2018   History of kidney stones    Hypertension    Hypoglycemia    Hypoglycemia    Iron deficiency anemia 07/11/2016   Neuropathy    both arms   Normocytic anemia 07/29/2015   Pancreatitis    Pneumonia    PONV (postoperative nausea and vomiting)    Postoperative anemia due to acute blood loss  11/15/2015   R lung cancer    Sciatica    Seizures ([place])    as a child when she would have an asthma attack. none as an adult   Shortness of breath dyspnea    Past Surgical History:  Procedure Laterality Date   ABDOMINAL HYSTERECTOMY  2778   APPLICATION OF ROBOTIC ASSISTANCE FOR SPINAL PROCEDURE N/A 09/05/2016   Procedure: APPLICATION OF ROBOTIC ASSISTANCE FOR SPINAL PROCEDURE;  Surgeon: Consuella Lose, MD;  Location: [place];  Service: Neurosurgery;  Laterality: N/A;   BACK SURGERY     BIOPSY  06/14/2020   Procedure: BIOPSY;  Surgeon: Daneil Dolin, MD;  [facility];  Service: Endoscopy;;   [place]   COLONOSCOPY     unsure last   COLONOSCOPY WITH PROPOFOL N/A 06/14/2020   Procedure: COLONOSCOPY WITH PROPOFOL;  Surgeon: Daneil Dolin, MD;  [facility];  Service: Endoscopy;  Laterality: N/A;  PM   cyst removal face  disk repair     neck and lumbar region   ESOPHAGOGASTRODUODENOSCOPY (EGD) WITH PROPOFOL N/A 06/14/2020   Procedure: ESOPHAGOGASTRODUODENOSCOPY (EGD) WITH PROPOFOL;  Surgeon: Daneil Dolin, MD;  [facility];  Service: Endoscopy;  Laterality: N/A;   FOOT SURGERY Bilateral    to file bones down    HARDWARE REMOVAL N/A 09/05/2016   Procedure: HARDWARE REMOVAL AND REPLACEMENT OF LUMBAR FIVE SCREWS;  Surgeon: Consuella Lose, MD;  Location: [place];  Service: Neurosurgery;  Laterality: N/A;   IMPLANTATION / PLACEMENT EPIDURAL NEUROSTIMULATOR ELECTRODES     INTERCOSTAL NERVE BLOCK Right 07/29/2020   Procedure: INTERCOSTAL NERVE BLOCK;  Surgeon: Melrose Nakayama, MD;  Location: [place];  Service: Thoracic;  Laterality: Right;   LAMINECTOMY     2006   lobectomy Right 07/2020   LUMBAR FUSION     LYMPH NODE BIOPSY Right 07/29/2020   Procedure: LYMPH NODE BIOPSY;  Surgeon: Melrose Nakayama, MD;  Location: [place];  Service: Thoracic;  Laterality: Right;   POLYPECTOMY  06/14/2020   Procedure:  POLYPECTOMY INTESTINAL;  Surgeon: Daneil Dolin, MD;  [facility];  Service: Endoscopy;;   SPINAL CORD STIMULATOR BATTERY EXCHANGE     SPINAL CORD STIMULATOR INSERTION N/A 07/05/2016   Procedure: REPLACEMENT OF LUMBAR SPINAL CORD STIMULATOR BATTERY;  Surgeon: Newman Pies, MD;  Location: [place];  Service: Neurosurgery;  Laterality: N/A;   TONSILLECTOMY     Family History  Problem Relation Age of Onset   Heart disease Mother    Diabetes Mother    Hypertension Mother    Hyperlipidemia Mother    COPD Mother        smoked   Cancer Mother        bile duct   COPD Father    Diabetes Father    Heart disease Father    Cancer Father        throat   Dementia Father    Asthma Son    Hypertension Sister    Hyperlipidemia Sister    Hypertension Brother    Hyperlipidemia Brother    Hypertension Brother    Hyperlipidemia Brother    Emphysema Maternal Grandmother        smoked   Colon cancer Neg Hx    Colon polyps Neg Hx    Social History   Socioeconomic History   Marital status: Married    Spouse name: [name]   Number of children: 1   Years of education: Not on file   Highest education level: Some college, no degree  Occupational History   Occupation: disability  Tobacco Use   Smoking status: Every Day    Packs/day: 0.25    Years: 35.00    Pack years: 8.75    Types: Cigarettes   Smokeless tobacco: Never   Tobacco comments:    trying to quit  Vaping Use   Vaping Use: Never used  Substance and Sexual Activity   Alcohol use: Yes    Alcohol/week: 2.0 standard drinks    Types: 2 Glasses of wine per week    Comment: occasionally   Drug use: No   Sexual activity: Yes    Birth control/protection: None  Other Topics Concern   Not on file  Social History Narrative   Retired, 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 1 son, deceased. 2 pet dogs. Enjoys watching TV.   Husband takes care of cooking, cleaning, driving, and helps her with ADLs including  bathing  Social Determinants of Health   Financial Resource Strain: Low Risk    Difficulty of Paying Living Expenses: Not very hard  Food Insecurity: No Food Insecurity   Worried About Charity fundraiser in the Last Year: Never true   Ran Out of Food in the Last Year: Never true  Transportation Needs: No Transportation Needs   Lack of Transportation (Medical): No   Lack of Transportation (Non-Medical): No  Physical Activity: Insufficiently Active   Days of Exercise per Week: 7 days   Minutes of Exercise per Session: 10 min  Stress: No Stress Concern Present   Feeling of Stress : Not at all  Social Connections: Socially Integrated   Frequency of Communication with Friends and Family: More than three times a week   Frequency of Social Gatherings with Friends and Family: More than three times a week   Attends Religious Services: 1 to 4 times per year   Active Member of [employer] or Organizations: Yes   Attends Archivist Meetings: 1 to 4 times per year   Marital Status: Married    Tobacco Counseling Ready to quit: Not Answered Counseling given: Not Answered Tobacco comments: trying to quit   Clinical Intake:  Pre-visit preparation completed: Yes  Pain : 0-10 Pain Score: 9  Pain Type: Chronic pain Pain Location: Back Pain Orientation: Lower Pain Descriptors / Indicators: Aching, Discomfort, Sore Pain Onset: 1 to 4 weeks ago Pain Frequency: Intermittent     BMI - recorded: 18.07 Nutritional Status: BMI <19  Underweight Nutritional Risks: Unintentional weight loss, Nausea/ vomitting/ diarrhea (improved) Diabetes: No  How often do you need to have someone help you when you read instructions, pamphlets, or other written materials from your doctor or pharmacy?: 2 - Rarely  Diabetic? no  Interpreter Needed?: No  Information entered by :: Kassady Laboy, LPN   Activities of Daily Living In your present state of health, do you have any difficulty performing the  following activities: 04/01/2021 03/12/2021  Hearing? N N  Vision? N N  Difficulty concentrating or making decisions? N N  Walking or climbing stairs? Y N  Dressing or bathing? Y N  Doing errands, shopping? N N  Preparing Food and eating ? N -  Using the Toilet? N -  In the past six months, have you accidently leaked urine? Y -  Comment wears depends -  Do you have problems with loss of bowel control? N -  Managing your Medications? N -  Managing your Finances? N -  Housekeeping or managing your Housekeeping? Y -  Comment husband manages -  Some recent data might be hidden    Patient Care Team: Janora Norlander, DO as PCP - General (Family Medicine) Melrose Nakayama, MD as Consulting Physician (Cardiothoracic Surgery) Derek Jack, MD as Consulting Physician (Hematology) Tanda Rockers, MD as Consulting Physician (Pulmonary Disease) Brien Mates, RN as Oncology Nurse Navigator (Oncology)  Indicate any recent Medical Services you may have received from other than [facility] providers in the past year (date may be approximate).     Assessment:   This is a routine wellness examination for Lyann.  Hearing/Vision screen Hearing Screening - Comments:: C/o mild hearing difficulties   Vision Screening - Comments:: Wears rx glasses - up to date with annual eye exams with [facility] in [place] issues and exercise activities discussed: Current Exercise Habits: Home exercise routine, Type of exercise: walking, Time (Minutes): 10, Frequency (Times/Week): 7, Weekly Exercise (Minutes/Week): 70, Intensity:  Mild, Exercise limited by: respiratory conditions(s);cardiac condition(s);orthopedic condition(s)   Goals Addressed             This Visit's Progress    Patient Stated   On track    03/29/2020 AWV Goal: Fall Prevention  Over the next year, patient will decrease their risk for falls by: Using assistive devices, such as a cane or walker, as needed Identifying fall  risks within their home and correcting them by: Removing throw rugs Adding handrails to stairs or ramps Removing clutter and keeping a clear pathway throughout the home Increasing light, especially at night Adding shower handles/bars Raising toilet seat Identifying potential personal risk factors for falls: Medication side effects Incontinence/urgency Vestibular dysfunction Hearing loss Musculoskeletal disorders Neurological disorders Orthostatic hypotension       Quit Smoking         Depression Screen PHQ 2/9 Scores 04/01/2021 01/10/2021 11/05/2020 09/15/2020 08/30/2020 07/09/2020 06/16/2020  PHQ - 2 Score 0 0 - 1 1 0 1  PHQ- 9 Score - 1 - 4 4 - 12  Exception Documentation - - Patient refusal - - - -    Fall Risk Fall Risk  04/01/2021 01/10/2021 11/05/2020 09/15/2020 08/30/2020  Falls in the past year? 1 1 0 1 1  Number falls in past yr: 1 1 - 1 1  Injury with Fall? 1 0 - 1 0  Risk for fall due to : History of fall(s);Impaired balance/gait;Medication side effect;Orthopedic patient History of fall(s);Impaired balance/gait - History of fall(s);Impaired balance/gait;Mental status change History of fall(s);Impaired balance/gait  Follow up Education provided;Falls prevention discussed Falls prevention discussed - Falls evaluation completed -    FALL RISK PREVENTION PERTAINING TO THE HOME:  Any stairs in or around the home? Yes  If so, are there any without handrails? No  Home free of loose throw rugs in walkways, pet beds, electrical cords, etc? Yes  Adequate lighting in your home to reduce risk of falls? Yes   ASSISTIVE DEVICES UTILIZED TO PREVENT FALLS:  Life alert? No  Use of a cane, walker or w/c? Yes  Grab bars in the bathroom? Yes  Shower chair or bench in shower? Yes  Elevated toilet seat or a handicapped toilet? Yes   TIMED UP AND GO:  Was the test performed? No . Telephonic visit  Cognitive Function:     6CIT Screen 04/01/2021 03/29/2020  What Year? 0 points 0 points   What month? 0 points 0 points  What time? 0 points 0 points  Count back from 20 0 points 2 points  Months in reverse 0 points 0 points  Repeat phrase 2 points 0 points  Total Score 2 2    Immunizations Immunization History  Administered Date(s) Administered   Fluad Quad(high Dose 65+) 12/02/2018, 03/10/2020, 01/10/2021   Influenza, Quadrivalent, Recombinant, Inj, Pf 01/16/2017   Influenza,inj,Quad PF,6+ Mos 02/22/2015, 01/03/2016   Influenza-Unspecified 12/04/2017   Moderna Sars-Covid-2 Vaccination 05/29/2019, 06/29/2019   PFIZER(Purple Top)SARS-COV-2 Vaccination 03/30/2020   Pneumococcal Conjugate-13 08/20/2019   Pneumococcal Polysaccharide-23 11/05/2020   Tdap 05/29/2020   Zoster Recombinat (Shingrix) 03/30/2020    TDAP status: Up to date  Flu Vaccine status: Up to date  Pneumococcal vaccine status: Up to date  Covid-19 vaccine status: Completed vaccines  Qualifies for Shingles Vaccine? Yes   Zostavax completed Yes   Shingrix Completed?: No.    Education has been provided regarding the importance of this vaccine. Patient has been advised to call insurance company to determine out of pocket expense if  they have not yet received this vaccine. Advised may also receive vaccine at local pharmacy or Health Dept. Verbalized acceptance and understanding.  Screening Tests Health Maintenance  Topic Date Due   COVID-19 Vaccine (4 - Booster) 05/25/2020   Zoster Vaccines- Shingrix (2 of 2) 05/25/2020   Hepatitis C Screening  07/09/2021 (Originally 08/23/1973)   DEXA SCAN  07/13/2022   MAMMOGRAM  01/11/2023   COLONOSCOPY (Pts 45-62yrs Insurance coverage will need to be confirmed)  06/15/2023   TETANUS/TDAP  05/30/2030   Pneumonia Vaccine 45+ Years old  Completed   INFLUENZA VACCINE  Completed   HPV VACCINES  Aged Out    Health Maintenance  Health Maintenance Due  Topic Date Due   COVID-19 Vaccine (4 - Booster) 05/25/2020   Zoster Vaccines- Shingrix (2 of 2) 05/25/2020     Colorectal cancer screening: Type of screening: Colonoscopy. Completed 06/14/2020. Repeat every 3 years  Mammogram status: Completed 01/10/2021. Repeat every year  Bone Density status: Completed 07/12/2020. Results reflect: Bone density results: OSTEOPOROSIS. Repeat every 2 years.  Lung Cancer Screening: (Low Dose CT Chest recommended if Age 11-80 years, 30 pack-year currently smoking OR have quit w/in 15years.) does not qualify. She has lung cancer  Additional Screening:  Hepatitis C Screening: does qualify; Due  Vision Screening: Recommended annual ophthalmology exams for early detection of glaucoma and other disorders of the eye. Is the patient up to date with their annual eye exam?  Yes  Who is the provider or what is the name of the office in which the patient attends annual eye exams? [facility] in [place] If pt is not established with a provider, would they like to be referred to a provider to establish care? No .   Dental Screening: Recommended annual dental exams for proper oral hygiene  Community Resource Referral / Chronic Care Management: CRR required this visit?  No   CCM required this visit?  No      Plan:     I have personally reviewed and noted the following in the patients chart:   Medical and social history Use of alcohol, tobacco or illicit drugs  Current medications and supplements including opioid prescriptions.  Functional ability and status Nutritional status Physical activity Advanced directives List of other physicians Hospitalizations, surgeries, and ER visits in previous 12 months Vitals Screenings to include cognitive, depression, and falls Referrals and appointments  In addition, I have reviewed and discussed with patient certain preventive protocols, quality metrics, and best practice recommendations. A written personalized care plan for preventive services as well as general preventive health recommendations were provided to patient.      Sandrea Hammond, LPN   0/17/7939   Nurse Notes: None

## 2021-04-11 ENCOUNTER — Ambulatory Visit: Payer: Medicare Other | Admitting: Family Medicine

## 2021-05-17 ENCOUNTER — Ambulatory Visit: Payer: Medicare Other | Admitting: Family Medicine

## 2021-05-19 ENCOUNTER — Encounter: Payer: Self-pay | Admitting: Family Medicine

## 2021-05-19 MED ORDER — OXYBUTYNIN CHLORIDE ER 5 MG PO TB24: 5.0000 mg | ORAL_TABLET | Freq: Every day | ORAL | 0 refills | Status: DC

## 2021-05-19 MED ORDER — TERBINAFINE HCL 250 MG PO TABS: 250.0000 mg | ORAL_TABLET | Freq: Every day | ORAL | 0 refills | Status: DC

## 2021-05-19 NOTE — Addendum Note (Signed)
Addended by: Chevis Pretty on: 05/19/2021 02:03 PM ? ? Modules accepted: Orders ? ?

## 2021-05-20 ENCOUNTER — Other Ambulatory Visit: Payer: Self-pay | Admitting: Family Medicine

## 2021-06-06 ENCOUNTER — Ambulatory Visit: Payer: Medicare Other | Admitting: Family Medicine

## 2021-06-06 ENCOUNTER — Other Ambulatory Visit: Payer: Self-pay | Admitting: Family Medicine

## 2021-06-06 DIAGNOSIS — Z01818 Encounter for other preprocedural examination: Secondary | ICD-10-CM

## 2021-06-06 DIAGNOSIS — E43 Unspecified severe protein-calorie malnutrition: Secondary | ICD-10-CM

## 2021-06-06 DIAGNOSIS — D509 Iron deficiency anemia, unspecified: Secondary | ICD-10-CM

## 2021-06-07 ENCOUNTER — Encounter: Payer: Self-pay | Admitting: Family Medicine

## 2021-06-07 ENCOUNTER — Ambulatory Visit (INDEPENDENT_AMBULATORY_CARE_PROVIDER_SITE_OTHER): Payer: Medicare Other | Admitting: Family Medicine

## 2021-06-07 VITALS — BP 137/86 | HR 122 | Temp 98.3°F | Ht 63.0 in | Wt 83.8 lb

## 2021-06-07 DIAGNOSIS — Z01818 Encounter for other preprocedural examination: Secondary | ICD-10-CM | POA: Diagnosis not present

## 2021-06-07 DIAGNOSIS — E43 Unspecified severe protein-calorie malnutrition: Secondary | ICD-10-CM

## 2021-06-07 DIAGNOSIS — D509 Iron deficiency anemia, unspecified: Secondary | ICD-10-CM

## 2021-06-07 DIAGNOSIS — Z902 Acquired absence of lung [part of]: Secondary | ICD-10-CM | POA: Diagnosis not present

## 2021-06-07 DIAGNOSIS — R1110 Vomiting, unspecified: Secondary | ICD-10-CM | POA: Diagnosis not present

## 2021-06-07 DIAGNOSIS — J432 Centrilobular emphysema: Secondary | ICD-10-CM

## 2021-06-07 DIAGNOSIS — E860 Dehydration: Secondary | ICD-10-CM

## 2021-06-07 DIAGNOSIS — J9611 Chronic respiratory failure with hypoxia: Secondary | ICD-10-CM

## 2021-06-07 LAB — BAYER DCA HB A1C WAIVED: HB A1C (BAYER DCA - WAIVED): 4.7 % — ABNORMAL LOW (ref 4.8–5.6)

## 2021-06-07 LAB — URINALYSIS
Bilirubin, UA: NEGATIVE
Glucose, UA: NEGATIVE
Ketones, UA: NEGATIVE
Leukocytes,UA: NEGATIVE
Nitrite, UA: NEGATIVE
RBC, UA: NEGATIVE
Specific Gravity, UA: 1.01 (ref 1.005–1.030)
Urobilinogen, Ur: 0.2 mg/dL (ref 0.2–1.0)
pH, UA: 6 (ref 5.0–7.5)

## 2021-06-07 MED ORDER — SODIUM CHLORIDE 0.9 % IV SOLN: INTRAVENOUS | Status: AC

## 2021-06-07 MED ORDER — PROMETHAZINE HCL 25 MG RE SUPP: 25.0000 mg | Freq: Four times a day (QID) | RECTAL | 1 refills | Status: DC | PRN

## 2021-06-07 NOTE — Patient Instructions (Addendum)
You had labs performed today.  You will be contacted with the results of the labs once they are available, usually in the next 3 business days for routine lab work.  If you have an active my chart account, they will be released to your MyChart.  If you prefer to have these labs released to you via telephone, please let us know. ? ?Dr Daisy Lazar: [phone] ?[place], [place]: [phone]  ? ? Tardive Dyskinesia ?Tardive dyskinesia is a disorder that causes uncontrollable body movements. It occurs in some people who are taking certain medicines to treat a mental illness (neuroleptic medicine) or have taken this type of medicine in the past. These medicines block the effects of a specific brain chemical (dopamine). Sometimes, tardive dyskinesia starts months or years after someone took the medicine. Not everyone who takes a neuroleptic medicine will get tardive dyskinesia. ?What are the causes? ?This condition is caused by changes in your brain that are associated with taking a neuroleptic medicine. ?What increases the risk? ?If you are taking a neuroleptic medicine, your risk for tardive dyskinesia may be higher if you: ?Are taking an older type of neuroleptic medicine. ?Have been taking the medicine for a long time at a high dose. ?Are a woman past the age of menopause. ?Are older than 60. ?Have a history of alcohol or drug abuse. ?What are the signs or symptoms? ?Abnormal, uncontrollable movements are the main symptom of tardive dyskinesia. These types of movements may include: ?Grimacing. ?Sticking out or twisting your tongue. ?Making chewing or sucking sounds. ?Making grunting or sighing noises. ?Blinking your eyes. ?Twisting, swaying, or thrusting your body. ?Foot tapping or finger waving. ?Rapid movements of your arms or legs. ?How is this diagnosed? ?Your health care provider may suspect that you have tardive dyskinesia if: ?You have been taking neuroleptic medicines. ?You have  abnormal movements that you cannot control. ?If you are taking a medicine that can cause tardive dyskinesia, your health care provider may screen you for early signs of the condition. This may include: ?Observing your body movements. ?Using a specific rating scale called the Abnormal Involuntary Movement Scale (AIMS). ?You may also have tests to rule out other conditions that cause abnormal body movements, including: ?Parkinson's disease. ?Huntington's disease. ?Stroke. ?How is this treated? ?The best treatment for tardive dyskinesia is to lower the dose of your medicine or to switch to a different medicine at the first sign of abnormal and uncontrolled movements. There is no cure for long-term (chronic) tardive dyskinesia. Some medicines may help control the movements. These include: ?Clozapine, a medicine used to treat mental illness (antipsychotic). ?Some muscle relaxants. ?Some anti-seizure medicines. ?Some medicines used to treat high blood pressure. ?Some tranquilizers (sedatives). ?Follow these instructions at home: ?  ?Take over-the-counter and prescription medicines only as told by your health care provider. Do not stop or start taking any medicines without talking to your health care provider first. ?Do not abuse drugs or alcohol. ?Keep all follow-up visits as told by your health care provider. This is important. ?Contact a health care provider if: ?You are unable to eat or drink. ?You have had a fall. ?Your symptoms get worse. ?Summary ?Tardive dyskinesia is a disorder that causes uncontrollable body movements. These may include grimacing, sticking out or twisting your tongue, blinking your eyes, or rapid movements of your arms or legs. ?The condition occurs in some people who are taking certain medicines to treat a mental illness (neuroleptic medicine) or have taken  this type of medicine in the past. ?The best treatment for tardive dyskinesia is to lower the dose of your medicine or to switch to a  different medicine at the first sign of abnormal and uncontrolled movements. ?There is no cure for long-term (chronic) tardive dyskinesia, but some medicines may help control the movements. ?This information is not intended to replace advice given to you by your health care provider. Make sure you discuss any questions you have with your health care provider. ?Document Revised: 10/28/2020 Document Reviewed: 03/15/2017 ?Elsevier Patient Education ? 2022 [place]. ? ? ?

## 2021-06-07 NOTE — Progress Notes (Signed)
Pt is a 66 y.o. female who is here for preoperative clearance for cataract surgery with [facility].  This is yet to be scheduled.  Will be a conscious sedation.  She reports no chest pain.  She has chronic shortness of breath.  She has been experiencing some ongoing chronic nausea and vomiting that was exacerbated while her husband was out of town.  She had a several pound weight loss that was noted on her most recent checkup with her pain specialist.  She has been self backing down on her pain medications because she is worried now that her weight is decreasing that she may be taking too much.  She reports good bowel movements and denies any constipation.  The vomiting is projectile and occurs without warning.  She would like to try something other than the Zofran as this has not been helpful.  She notes that this has been worked up previously by a GI provider in [place] and she was told that she simply had involuntary vomiting/bulimia.  However no recommendations were made on how to fix this.  She has not mentioned this to Dr. Rourk, her GI provider ? ?1) High Risk Cardiac Conditions ? 1) Recent MI - No. ? 2) Decompensated Heart Failure - No. ? 3) Unstable angina - No. ? 4) Symptomatic arrythmia - No. ? 5) Sx Valvular Disease - No. ? ?2) Intermediate Risk Factors - DM, CKD, CVA, CHF, CAD - No. ? ?2) Functional Status - > 4 mets (Walk, run, climb stairs) can but it causes dyspnea.  ? ?3) Surgery Specific Risk - Low (Cataract) ? ?4) Further Noninvasive evaluation -  ? 1) EKG - No. ?  1) Done 03/2021 ? 2) Echo - No. ?  1) Worsening dyspnea  ? 3) Stress Testing - Active Cardiac Disease - No. ? ?5) Need for medical therapy - Beta Blocker, Statins indicated ? No. ? ?PE: ?Vitals:  ? 06/07/21 1352  ?BP: 137/86  ?Pulse: (!) 122  ?Temp: 98.3 ?F (36.8 ?C)  ?SpO2: 99%  ? ?Physical Examination: General appearance -chronically ill-appearing female, underweight ?Mental status - alert, oriented to person, place, and  time ?Eyes - pupils equal and reactive, extraocular eye movements intact ?Mouth - mucous membranes are tacky.  Oropharynx without masses.  Mallampati 2 ?Chest -globally decreased breath sounds with mild wheezes.  She has labored work of breathing on oxygen via nasal cannula with prolonged speech ?Heart - sinus tachycardia, regular rhythm, normal S1, S2, no murmurs, rubs, clicks or gallops ? ? ?Preop exam for internal medicine - Plan: Protime-INR, Bayer DCA Hb A1c Waived, CBC with Differential, CMP14+EGFR, Urinalysis ? ?Protein-calorie malnutrition, severe (HCC) - Plan: Bayer DCA Hb A1c Waived, CBC with Differential, CMP14+EGFR, Urinalysis ? ?Vomiting, unspecified vomiting type, unspecified whether nausea present - Plan: promethazine (PHENERGAN) 25 MG suppository ? ?S/P lobectomy of lung ? ?Chronic respiratory failure with hypoxia (HCC) ? ?Centrilobular emphysema (HCC) ? ?Dehydration - Plan: 0.9 %  sodium chloride infusion ? ?Iron deficiency anemia, unspecified iron deficiency anemia type - Plan: Protime-INR ? ?I have independently evaluated patient.  Jasmine [name] is a 66 y.o. female who is intermediate risk (oxygen dependent and malnourished) for a low risk surgery.  There are not modifiable risk factors  ?Ronneisha C Ruhland's RCRI calculation for MACE is: 0.   ? ?I do worry about her protein calorie malnourishment and she unfortunately continues to lose weight.  This seems to be related to some uncontrolled nausea and vomiting which apparently   had been worked up in [place] and she was just told it was something she would have to deal with.  To my knowledge she has not had any further work-up with Dr. Rourk about this but I will CC him on this chart as an FYI.  I am going to trial her on some phentermine to see if we can help her maintain some oral intake.  She was treated with a bag of IV fluids today as she appeared dehydrated and was mildly tachycardic on exam.  This seemed to help her a little bit.  I gave her  information about how she can obtain IV fluids at home if needed but really would like to pick her up via oral intake if possible.  I suspect that she may need to have a gastric emptying study test or something similar to evaluate her intermediate but regular projectile vomiting. ? ?Labs have been ordered and we will CC these to her specialist for cataract surgery.  She will get her pulmonary clearance scheduled.  She had chest x-ray back in January so I did not repeat these today. ? ? ? M. , DO ?[facility] ? ? ? ? ? ? ? ? ? ?

## 2021-06-08 LAB — CBC WITH DIFFERENTIAL/PLATELET
Basophils Absolute: 0.1 10*3/uL (ref 0.0–0.2)
Basos: 1 %
EOS (ABSOLUTE): 0 10*3/uL (ref 0.0–0.4)
Eos: 0 %
Hematocrit: 31.7 % — ABNORMAL LOW (ref 34.0–46.6)
Hemoglobin: 10.4 g/dL — ABNORMAL LOW (ref 11.1–15.9)
Immature Grans (Abs): 0.1 10*3/uL (ref 0.0–0.1)
Immature Granulocytes: 1 %
Lymphocytes Absolute: 0.7 10*3/uL (ref 0.7–3.1)
Lymphs: 10 %
MCH: 33.2 pg — ABNORMAL HIGH (ref 26.6–33.0)
MCHC: 32.8 g/dL (ref 31.5–35.7)
MCV: 101 fL — ABNORMAL HIGH (ref 79–97)
Monocytes Absolute: 0.6 10*3/uL (ref 0.1–0.9)
Monocytes: 8 %
Neutrophils Absolute: 6.1 10*3/uL (ref 1.4–7.0)
Neutrophils: 80 %
Platelets: 257 10*3/uL (ref 150–450)
RBC: 3.13 x10E6/uL — ABNORMAL LOW (ref 3.77–5.28)
RDW: 12.1 % (ref 11.7–15.4)
WBC: 7.5 10*3/uL (ref 3.4–10.8)

## 2021-06-08 LAB — CMP14+EGFR
ALT: 16 IU/L (ref 0–32)
AST: 21 IU/L (ref 0–40)
Albumin/Globulin Ratio: 1.1 — ABNORMAL LOW (ref 1.2–2.2)
Albumin: 2.6 g/dL — ABNORMAL LOW (ref 3.8–4.8)
Alkaline Phosphatase: 128 IU/L — ABNORMAL HIGH (ref 44–121)
BUN/Creatinine Ratio: 17 (ref 12–28)
BUN: 11 mg/dL (ref 8–27)
Bilirubin Total: 0.4 mg/dL (ref 0.0–1.2)
CO2: 21 mmol/L (ref 20–29)
Calcium: 8.1 mg/dL — ABNORMAL LOW (ref 8.7–10.3)
Chloride: 107 mmol/L — ABNORMAL HIGH (ref 96–106)
Creatinine, Ser: 0.64 mg/dL (ref 0.57–1.00)
Globulin, Total: 2.4 g/dL (ref 1.5–4.5)
Glucose: 103 mg/dL — ABNORMAL HIGH (ref 70–99)
Potassium: 4.6 mmol/L (ref 3.5–5.2)
Sodium: 139 mmol/L (ref 134–144)
Total Protein: 5 g/dL — ABNORMAL LOW (ref 6.0–8.5)
eGFR: 97 mL/min/{1.73_m2} (ref >59)

## 2021-06-08 LAB — PROTIME-INR
INR: 1.1 (ref 0.9–1.2)
Prothrombin Time: 11.1 s (ref 9.1–12.0)

## 2021-06-15 ENCOUNTER — Ambulatory Visit: Payer: Medicare Other | Admitting: Nurse Practitioner

## 2021-06-16 ENCOUNTER — Telehealth: Payer: Self-pay | Admitting: Internal Medicine

## 2021-06-16 NOTE — Telephone Encounter (Signed)
[facility] for eye surgery  ?

## 2021-06-16 NOTE — Telephone Encounter (Signed)
Fax received from Dr. Julian Reil to perform a Cataract extraction by PE,IOL- Right then left under IV sedation on patient.  Patient needs surgery clearance. Patient was seen on 11/03/2020. Patient is scheduled for OV on 07/18/2021 in [place]. Office protocol is a risk assessment can be sent to surgeon if patient has been seen in 60 days or less.  ? ?Sending to Dr. Melvyn Novas for risk assessment or recommendations if patient needs to be seen in office prior to surgical procedure.   ?

## 2021-06-23 NOTE — Telephone Encounter (Signed)
OV notes and clearance note faxed back to [facility]. Nothing further needed at this time. ?

## 2021-06-24 ENCOUNTER — Ambulatory Visit: Payer: Medicare Other | Admitting: Family Medicine

## 2021-06-27 ENCOUNTER — Inpatient Hospital Stay (HOSPITAL_COMMUNITY)
Admission: RE | Admit: 2021-06-27 | Discharge: 2021-06-27 | Disposition: A | Discharge status: Home / Self Care | Payer: Medicare Other | Source: Ambulatory Visit | Attending: Hematology | Admitting: Hematology

## 2021-06-27 ENCOUNTER — Telehealth: Payer: Self-pay

## 2021-06-27 ENCOUNTER — Inpatient Hospital Stay (HOSPITAL_COMMUNITY): Payer: Medicare Other | Attending: Hematology

## 2021-06-27 DIAGNOSIS — C3491 Malignant neoplasm of unspecified part of right bronchus or lung: Secondary | ICD-10-CM

## 2021-06-27 DIAGNOSIS — K838 Other specified diseases of biliary tract: Secondary | ICD-10-CM | POA: Diagnosis not present

## 2021-06-27 DIAGNOSIS — D539 Nutritional anemia, unspecified: Secondary | ICD-10-CM | POA: Insufficient documentation

## 2021-06-27 DIAGNOSIS — R112 Nausea with vomiting, unspecified: Secondary | ICD-10-CM | POA: Diagnosis not present

## 2021-06-27 LAB — COMPREHENSIVE METABOLIC PANEL
ALT: 19 U/L (ref 0–44)
AST: 32 U/L (ref 15–41)
Albumin: 2.2 g/dL — ABNORMAL LOW (ref 3.5–5.0)
Alkaline Phosphatase: 124 U/L (ref 38–126)
Anion gap: 13 (ref 5–15)
BUN: 7 mg/dL — ABNORMAL LOW (ref 8–23)
CO2: 22 mmol/L (ref 22–32)
Calcium: 8.2 mg/dL — ABNORMAL LOW (ref 8.9–10.3)
Chloride: 107 mmol/L (ref 98–111)
Creatinine, Ser: 0.44 mg/dL (ref 0.44–1.00)
GFR, Estimated: >60 mL/min (ref >60)
Glucose, Bld: 92 mg/dL (ref 70–99)
Potassium: 2.9 mmol/L — ABNORMAL LOW (ref 3.5–5.1)
Sodium: 142 mmol/L (ref 135–145)
Total Bilirubin: 0.7 mg/dL (ref 0.3–1.2)
Total Protein: 5.7 g/dL — ABNORMAL LOW (ref 6.5–8.1)

## 2021-06-27 LAB — CBC WITH DIFFERENTIAL/PLATELET
Abs Immature Granulocytes: 0.05 10*3/uL (ref 0.00–0.07)
Basophils Absolute: 0.1 10*3/uL (ref 0.0–0.1)
Basophils Relative: 1 %
Eosinophils Absolute: 0.1 10*3/uL (ref 0.0–0.5)
Eosinophils Relative: 1 %
HCT: 32.7 % — ABNORMAL LOW (ref 36.0–46.0)
Hemoglobin: 10 g/dL — ABNORMAL LOW (ref 12.0–15.0)
Immature Granulocytes: 1 %
Lymphocytes Relative: 17 %
Lymphs Abs: 1.3 10*3/uL (ref 0.7–4.0)
MCH: 34.5 pg — ABNORMAL HIGH (ref 26.0–34.0)
MCHC: 30.6 g/dL (ref 30.0–36.0)
MCV: 112.8 fL — ABNORMAL HIGH (ref 80.0–100.0)
Monocytes Absolute: 0.8 10*3/uL (ref 0.1–1.0)
Monocytes Relative: 10 %
Neutro Abs: 5.6 10*3/uL (ref 1.7–7.7)
Neutrophils Relative %: 70 %
Platelets: 261 10*3/uL (ref 150–400)
RBC: 2.9 MIL/uL — ABNORMAL LOW (ref 3.87–5.11)
RDW: 14.7 % (ref 11.5–15.5)
WBC: 7.8 10*3/uL (ref 4.0–10.5)
nRBC: 0 % (ref 0.0–0.2)

## 2021-06-27 LAB — IRON AND TIBC: Iron: 68 ug/dL (ref 28–170)

## 2021-06-27 LAB — FERRITIN: Ferritin: 457 ng/mL — ABNORMAL HIGH (ref 11–307)

## 2021-06-27 MED ORDER — IOHEXOL 300 MG/ML  SOLN
75.0000 mL | Freq: Once | INTRAMUSCULAR | Status: AC | PRN
  Administered 2021-06-27: 75 mL via INTRAVENOUS

## 2021-06-27 NOTE — Telephone Encounter (Signed)
Pt's husband called to reschedule pt's appt that is scheduled for May.  ?

## 2021-06-28 ENCOUNTER — Inpatient Hospital Stay (HOSPITAL_COMMUNITY)
Admission: EM | Admit: 2021-06-28 | Discharge: 2021-07-03 | DRG: 444 | Disposition: A | Discharge status: Home / Self Care | Payer: Medicare Other | Attending: Family Medicine | Admitting: Family Medicine

## 2021-06-28 ENCOUNTER — Other Ambulatory Visit: Payer: Self-pay

## 2021-06-28 ENCOUNTER — Encounter (HOSPITAL_COMMUNITY): Payer: Self-pay | Admitting: *Deleted

## 2021-06-28 DIAGNOSIS — Z85118 Personal history of other malignant neoplasm of bronchus and lung: Secondary | ICD-10-CM

## 2021-06-28 DIAGNOSIS — Z825 Family history of asthma and other chronic lower respiratory diseases: Secondary | ICD-10-CM

## 2021-06-28 DIAGNOSIS — Z885 Allergy status to narcotic agent status: Secondary | ICD-10-CM

## 2021-06-28 DIAGNOSIS — G629 Polyneuropathy, unspecified: Secondary | ICD-10-CM | POA: Diagnosis present

## 2021-06-28 DIAGNOSIS — K8681 Exocrine pancreatic insufficiency: Secondary | ICD-10-CM | POA: Diagnosis present

## 2021-06-28 DIAGNOSIS — F17210 Nicotine dependence, cigarettes, uncomplicated: Secondary | ICD-10-CM | POA: Diagnosis present

## 2021-06-28 DIAGNOSIS — J9611 Chronic respiratory failure with hypoxia: Secondary | ICD-10-CM | POA: Diagnosis present

## 2021-06-28 DIAGNOSIS — L89151 Pressure ulcer of sacral region, stage 1: Secondary | ICD-10-CM | POA: Diagnosis present

## 2021-06-28 DIAGNOSIS — R109 Unspecified abdominal pain: Secondary | ICD-10-CM | POA: Diagnosis present

## 2021-06-28 DIAGNOSIS — Z79899 Other long term (current) drug therapy: Secondary | ICD-10-CM

## 2021-06-28 DIAGNOSIS — G894 Chronic pain syndrome: Secondary | ICD-10-CM | POA: Diagnosis present

## 2021-06-28 DIAGNOSIS — R64 Cachexia: Secondary | ICD-10-CM | POA: Diagnosis present

## 2021-06-28 DIAGNOSIS — Z7951 Long term (current) use of inhaled steroids: Secondary | ICD-10-CM

## 2021-06-28 DIAGNOSIS — Z902 Acquired absence of lung [part of]: Secondary | ICD-10-CM

## 2021-06-28 DIAGNOSIS — R112 Nausea with vomiting, unspecified: Principal | ICD-10-CM

## 2021-06-28 DIAGNOSIS — E43 Unspecified severe protein-calorie malnutrition: Secondary | ICD-10-CM | POA: Diagnosis present

## 2021-06-28 DIAGNOSIS — Z833 Family history of diabetes mellitus: Secondary | ICD-10-CM

## 2021-06-28 DIAGNOSIS — L8990 Pressure ulcer of unspecified site, unspecified stage: Secondary | ICD-10-CM | POA: Diagnosis present

## 2021-06-28 DIAGNOSIS — Z681 Body mass index (BMI) 19 or less, adult: Secondary | ICD-10-CM

## 2021-06-28 DIAGNOSIS — Z800 Family history of malignant neoplasm of digestive organs: Secondary | ICD-10-CM

## 2021-06-28 DIAGNOSIS — Z8249 Family history of ischemic heart disease and other diseases of the circulatory system: Secondary | ICD-10-CM

## 2021-06-28 DIAGNOSIS — Z83438 Family history of other disorder of lipoprotein metabolism and other lipidemia: Secondary | ICD-10-CM

## 2021-06-28 DIAGNOSIS — J449 Chronic obstructive pulmonary disease, unspecified: Secondary | ICD-10-CM | POA: Diagnosis present

## 2021-06-28 DIAGNOSIS — Z720 Tobacco use: Secondary | ICD-10-CM | POA: Diagnosis present

## 2021-06-28 DIAGNOSIS — Z888 Allergy status to other drugs, medicaments and biological substances status: Secondary | ICD-10-CM

## 2021-06-28 DIAGNOSIS — E876 Hypokalemia: Secondary | ICD-10-CM | POA: Diagnosis present

## 2021-06-28 DIAGNOSIS — Z9981 Dependence on supplemental oxygen: Secondary | ICD-10-CM

## 2021-06-28 DIAGNOSIS — I10 Essential (primary) hypertension: Secondary | ICD-10-CM | POA: Diagnosis present

## 2021-06-28 DIAGNOSIS — R54 Age-related physical debility: Secondary | ICD-10-CM | POA: Diagnosis present

## 2021-06-28 DIAGNOSIS — K861 Other chronic pancreatitis: Secondary | ICD-10-CM | POA: Diagnosis present

## 2021-06-28 DIAGNOSIS — Z801 Family history of malignant neoplasm of trachea, bronchus and lung: Secondary | ICD-10-CM

## 2021-06-28 DIAGNOSIS — M549 Dorsalgia, unspecified: Secondary | ICD-10-CM | POA: Diagnosis present

## 2021-06-28 DIAGNOSIS — K838 Other specified diseases of biliary tract: Principal | ICD-10-CM | POA: Diagnosis present

## 2021-06-28 DIAGNOSIS — Z881 Allergy status to other antibiotic agents status: Secondary | ICD-10-CM

## 2021-06-28 LAB — COMPREHENSIVE METABOLIC PANEL
ALT: 19 U/L (ref 0–44)
AST: 30 U/L (ref 15–41)
Albumin: 2.5 g/dL — ABNORMAL LOW (ref 3.5–5.0)
Alkaline Phosphatase: 149 U/L — ABNORMAL HIGH (ref 38–126)
Anion gap: 18 — ABNORMAL HIGH (ref 5–15)
BUN: 7 mg/dL — ABNORMAL LOW (ref 8–23)
CO2: 24 mmol/L (ref 22–32)
Calcium: 8.5 mg/dL — ABNORMAL LOW (ref 8.9–10.3)
Chloride: 99 mmol/L (ref 98–111)
Creatinine, Ser: 0.57 mg/dL (ref 0.44–1.00)
GFR, Estimated: >60 mL/min (ref >60)
Glucose, Bld: 143 mg/dL — ABNORMAL HIGH (ref 70–99)
Potassium: 2.9 mmol/L — ABNORMAL LOW (ref 3.5–5.1)
Sodium: 141 mmol/L (ref 135–145)
Total Bilirubin: 1.4 mg/dL — ABNORMAL HIGH (ref 0.3–1.2)
Total Protein: 6.3 g/dL — ABNORMAL LOW (ref 6.5–8.1)

## 2021-06-28 LAB — CBC WITH DIFFERENTIAL/PLATELET
Abs Immature Granulocytes: 0.03 10*3/uL (ref 0.00–0.07)
Basophils Absolute: 0 10*3/uL (ref 0.0–0.1)
Basophils Relative: 1 %
Eosinophils Absolute: 0 10*3/uL (ref 0.0–0.5)
Eosinophils Relative: 0 %
HCT: 38 % (ref 36.0–46.0)
Hemoglobin: 11.9 g/dL — ABNORMAL LOW (ref 12.0–15.0)
Immature Granulocytes: 1 %
Lymphocytes Relative: 9 %
Lymphs Abs: 0.5 10*3/uL — ABNORMAL LOW (ref 0.7–4.0)
MCH: 35 pg — ABNORMAL HIGH (ref 26.0–34.0)
MCHC: 31.3 g/dL (ref 30.0–36.0)
MCV: 111.8 fL — ABNORMAL HIGH (ref 80.0–100.0)
Monocytes Absolute: 0.2 10*3/uL (ref 0.1–1.0)
Monocytes Relative: 3 %
Neutro Abs: 5.4 10*3/uL (ref 1.7–7.7)
Neutrophils Relative %: 86 %
Platelets: 329 10*3/uL (ref 150–400)
RBC: 3.4 MIL/uL — ABNORMAL LOW (ref 3.87–5.11)
RDW: 14.5 % (ref 11.5–15.5)
WBC: 6.2 10*3/uL (ref 4.0–10.5)
nRBC: 0 % (ref 0.0–0.2)

## 2021-06-28 LAB — LIPASE, BLOOD: Lipase: 16 U/L (ref 11–51)

## 2021-06-28 NOTE — ED Triage Notes (Signed)
Pt arrived via [facility], Pt with N/V since Sunday and diarrhea today.  Reported that pt not able to keep anything down. ?

## 2021-06-29 ENCOUNTER — Encounter (HOSPITAL_COMMUNITY): Payer: Self-pay | Admitting: Family Medicine

## 2021-06-29 ENCOUNTER — Observation Stay (HOSPITAL_COMMUNITY): Payer: Medicare Other

## 2021-06-29 ENCOUNTER — Encounter: Payer: Self-pay | Admitting: Family Medicine

## 2021-06-29 ENCOUNTER — Other Ambulatory Visit: Payer: Self-pay

## 2021-06-29 DIAGNOSIS — J9611 Chronic respiratory failure with hypoxia: Secondary | ICD-10-CM

## 2021-06-29 DIAGNOSIS — R109 Unspecified abdominal pain: Secondary | ICD-10-CM | POA: Diagnosis not present

## 2021-06-29 DIAGNOSIS — E876 Hypokalemia: Secondary | ICD-10-CM

## 2021-06-29 DIAGNOSIS — R112 Nausea with vomiting, unspecified: Secondary | ICD-10-CM | POA: Diagnosis not present

## 2021-06-29 DIAGNOSIS — E43 Unspecified severe protein-calorie malnutrition: Secondary | ICD-10-CM

## 2021-06-29 DIAGNOSIS — G894 Chronic pain syndrome: Secondary | ICD-10-CM | POA: Diagnosis not present

## 2021-06-29 DIAGNOSIS — R1110 Vomiting, unspecified: Secondary | ICD-10-CM

## 2021-06-29 DIAGNOSIS — I10 Essential (primary) hypertension: Secondary | ICD-10-CM

## 2021-06-29 DIAGNOSIS — J449 Chronic obstructive pulmonary disease, unspecified: Secondary | ICD-10-CM

## 2021-06-29 LAB — COMPREHENSIVE METABOLIC PANEL
ALT: 18 U/L (ref 0–44)
AST: 25 U/L (ref 15–41)
Albumin: 2.2 g/dL — ABNORMAL LOW (ref 3.5–5.0)
Alkaline Phosphatase: 124 U/L (ref 38–126)
Anion gap: 16 — ABNORMAL HIGH (ref 5–15)
BUN: 9 mg/dL (ref 8–23)
CO2: 23 mmol/L (ref 22–32)
Calcium: 7.9 mg/dL — ABNORMAL LOW (ref 8.9–10.3)
Chloride: 105 mmol/L (ref 98–111)
Creatinine, Ser: 0.7 mg/dL (ref 0.44–1.00)
GFR, Estimated: >60 mL/min (ref >60)
Glucose, Bld: 98 mg/dL (ref 70–99)
Potassium: 3.6 mmol/L (ref 3.5–5.1)
Sodium: 144 mmol/L (ref 135–145)
Total Bilirubin: 1.1 mg/dL (ref 0.3–1.2)
Total Protein: 5.5 g/dL — ABNORMAL LOW (ref 6.5–8.1)

## 2021-06-29 LAB — BASIC METABOLIC PANEL
Anion gap: 12 (ref 5–15)
BUN: 9 mg/dL (ref 8–23)
CO2: 25 mmol/L (ref 22–32)
Calcium: 7.9 mg/dL — ABNORMAL LOW (ref 8.9–10.3)
Chloride: 107 mmol/L (ref 98–111)
Creatinine, Ser: 0.7 mg/dL (ref 0.44–1.00)
GFR, Estimated: >60 mL/min (ref >60)
Glucose, Bld: 113 mg/dL — ABNORMAL HIGH (ref 70–99)
Potassium: 4.1 mmol/L (ref 3.5–5.1)
Sodium: 144 mmol/L (ref 135–145)

## 2021-06-29 LAB — CBC
HCT: 34 % — ABNORMAL LOW (ref 36.0–46.0)
Hemoglobin: 10.4 g/dL — ABNORMAL LOW (ref 12.0–15.0)
MCH: 35.3 pg — ABNORMAL HIGH (ref 26.0–34.0)
MCHC: 30.6 g/dL (ref 30.0–36.0)
MCV: 115.3 fL — ABNORMAL HIGH (ref 80.0–100.0)
Platelets: 259 10*3/uL (ref 150–400)
RBC: 2.95 MIL/uL — ABNORMAL LOW (ref 3.87–5.11)
RDW: 14.6 % (ref 11.5–15.5)
WBC: 6 10*3/uL (ref 4.0–10.5)
nRBC: 0 % (ref 0.0–0.2)

## 2021-06-29 LAB — MAGNESIUM: Magnesium: 1.5 mg/dL — ABNORMAL LOW (ref 1.7–2.4)

## 2021-06-29 MED ORDER — POTASSIUM CHLORIDE IN NACL 40-0.9 MEQ/L-% IV SOLN
INTRAVENOUS | Status: DC
  Filled 2021-06-29 (×5): qty 1000

## 2021-06-29 MED ORDER — ENSURE MAX PROTEIN PO LIQD
1.0000 | Freq: Three times a day (TID) | ORAL | Status: DC
  Administered 2021-06-29 (×2): 330 mL via ORAL
  Administered 2021-06-30: 330 via ORAL
  Administered 2021-07-02 – 2021-07-03 (×4): 330 mL via ORAL

## 2021-06-29 MED ORDER — OXYCODONE HCL ER 10 MG PO T12A
10.0000 mg | EXTENDED_RELEASE_TABLET | Freq: Two times a day (BID) | ORAL | Status: DC
  Administered 2021-06-29 (×2): 10 mg via ORAL
  Filled 2021-06-29 (×3): qty 1

## 2021-06-29 MED ORDER — TRAZODONE HCL 50 MG PO TABS
150.0000 mg | ORAL_TABLET | Freq: Every evening | ORAL | Status: DC | PRN
  Administered 2021-06-29 – 2021-07-02 (×4): 150 mg via ORAL
  Filled 2021-06-29 (×4): qty 3

## 2021-06-29 MED ORDER — VITAMIN D 25 MCG (1000 UNIT) PO TABS
5000.0000 [IU] | ORAL_TABLET | Freq: Every day | ORAL | Status: DC
  Administered 2021-06-29 – 2021-07-03 (×5): 5000 [IU] via ORAL
  Filled 2021-06-29 (×5): qty 5

## 2021-06-29 MED ORDER — KETOROLAC TROMETHAMINE 30 MG/ML IJ SOLN
30.0000 mg | Freq: Once | INTRAMUSCULAR | Status: AC
  Administered 2021-06-29: 30 mg via INTRAVENOUS
  Filled 2021-06-29: qty 1

## 2021-06-29 MED ORDER — POTASSIUM CHLORIDE IN NACL 40-0.9 MEQ/L-% IV SOLN: INTRAVENOUS | Status: DC

## 2021-06-29 MED ORDER — IRON POLYSACCH CMPLX-B12-FA 150-0.025-1 MG PO CAPS: 1.0000 | ORAL_CAPSULE | Freq: Every day | ORAL | Status: DC

## 2021-06-29 MED ORDER — PROCHLORPERAZINE EDISYLATE 10 MG/2ML IJ SOLN
10.0000 mg | Freq: Four times a day (QID) | INTRAMUSCULAR | Status: DC | PRN
  Administered 2021-06-29 – 2021-07-03 (×11): 10 mg via INTRAVENOUS
  Filled 2021-06-29 (×11): qty 2

## 2021-06-29 MED ORDER — POTASSIUM CHLORIDE 10 MEQ/100ML IV SOLN
10.0000 meq | Freq: Once | INTRAVENOUS | Status: AC
  Administered 2021-06-29: 10 meq via INTRAVENOUS
  Filled 2021-06-29: qty 100

## 2021-06-29 MED ORDER — ATORVASTATIN CALCIUM 40 MG PO TABS
40.0000 mg | ORAL_TABLET | Freq: Every evening | ORAL | Status: DC
  Administered 2021-06-29 – 2021-07-02 (×4): 40 mg via ORAL
  Filled 2021-06-29 (×4): qty 1

## 2021-06-29 MED ORDER — ACETAMINOPHEN 325 MG PO TABS
650.0000 mg | ORAL_TABLET | Freq: Four times a day (QID) | ORAL | Status: DC | PRN
  Administered 2021-07-01: 650 mg via ORAL

## 2021-06-29 MED ORDER — DULOXETINE HCL 60 MG PO CPEP
90.0000 mg | ORAL_CAPSULE | Freq: Every day | ORAL | Status: DC
  Administered 2021-06-29 – 2021-07-03 (×5): 90 mg via ORAL
  Filled 2021-06-29 (×5): qty 1

## 2021-06-29 MED ORDER — PANTOPRAZOLE SODIUM 40 MG PO TBEC
40.0000 mg | DELAYED_RELEASE_TABLET | Freq: Every day | ORAL | Status: DC
  Administered 2021-06-29 – 2021-07-03 (×5): 40 mg via ORAL
  Filled 2021-06-29 (×5): qty 1

## 2021-06-29 MED ORDER — ONDANSETRON HCL 4 MG/2ML IJ SOLN
4.0000 mg | Freq: Once | INTRAMUSCULAR | Status: AC
  Administered 2021-06-29: 4 mg via INTRAVENOUS
  Filled 2021-06-29: qty 2

## 2021-06-29 MED ORDER — NICOTINE 14 MG/24HR TD PT24
14.0000 mg | MEDICATED_PATCH | Freq: Every day | TRANSDERMAL | Status: DC
  Administered 2021-06-29: 14 mg via TRANSDERMAL
  Filled 2021-06-29 (×2): qty 1

## 2021-06-29 MED ORDER — TIZANIDINE HCL 2 MG PO TABS: 6.0000 mg | ORAL_TABLET | Freq: Three times a day (TID) | ORAL | Status: DC | PRN

## 2021-06-29 MED ORDER — AMLODIPINE BESYLATE 5 MG PO TABS
10.0000 mg | ORAL_TABLET | Freq: Every day | ORAL | Status: DC
  Administered 2021-06-29 – 2021-07-03 (×5): 10 mg via ORAL
  Filled 2021-06-29 (×5): qty 2

## 2021-06-29 MED ORDER — MORPHINE SULFATE (PF) 2 MG/ML IV SOLN
2.0000 mg | INTRAVENOUS | Status: DC | PRN
  Administered 2021-06-29: 2 mg via INTRAVENOUS
  Filled 2021-06-29: qty 1

## 2021-06-29 MED ORDER — OXYBUTYNIN CHLORIDE ER 5 MG PO TB24
5.0000 mg | ORAL_TABLET | Freq: Every day | ORAL | Status: DC
  Administered 2021-06-29 – 2021-07-02 (×4): 5 mg via ORAL
  Filled 2021-06-29 (×4): qty 1

## 2021-06-29 MED ORDER — TIOTROPIUM BROMIDE MONOHYDRATE 2.5 MCG/ACT IN AERS: 2.0000 | INHALATION_SPRAY | Freq: Every day | RESPIRATORY_TRACT | Status: DC

## 2021-06-29 MED ORDER — ALBUTEROL SULFATE HFA 108 (90 BASE) MCG/ACT IN AERS: 2.0000 | INHALATION_SPRAY | Freq: Four times a day (QID) | RESPIRATORY_TRACT | Status: DC | PRN

## 2021-06-29 MED ORDER — BOOST / RESOURCE BREEZE PO LIQD CUSTOM
1.0000 | Freq: Three times a day (TID) | ORAL | Status: DC
  Administered 2021-06-29 – 2021-07-03 (×10): 1 via ORAL

## 2021-06-29 MED ORDER — UMECLIDINIUM BROMIDE 62.5 MCG/ACT IN AEPB
1.0000 | INHALATION_SPRAY | Freq: Every day | RESPIRATORY_TRACT | Status: DC
  Administered 2021-06-29 – 2021-07-03 (×5): 1 via RESPIRATORY_TRACT
  Filled 2021-06-29: qty 7

## 2021-06-29 MED ORDER — POLYSACCHARIDE IRON COMPLEX 150 MG PO CAPS
150.0000 mg | ORAL_CAPSULE | Freq: Every day | ORAL | Status: DC
  Administered 2021-06-29 – 2021-07-02 (×4): 150 mg via ORAL
  Filled 2021-06-29 (×4): qty 1

## 2021-06-29 MED ORDER — MOMETASONE FURO-FORMOTEROL FUM 200-5 MCG/ACT IN AERO
2.0000 | INHALATION_SPRAY | Freq: Two times a day (BID) | RESPIRATORY_TRACT | Status: DC
  Administered 2021-06-29 – 2021-07-03 (×9): 2 via RESPIRATORY_TRACT
  Filled 2021-06-29: qty 8.8

## 2021-06-29 MED ORDER — ENOXAPARIN SODIUM 30 MG/0.3ML IJ SOSY
30.0000 mg | PREFILLED_SYRINGE | INTRAMUSCULAR | Status: DC
  Administered 2021-06-29 – 2021-07-02 (×4): 30 mg via SUBCUTANEOUS
  Filled 2021-06-29 (×6): qty 0.3

## 2021-06-29 MED ORDER — MORPHINE SULFATE (PF) 4 MG/ML IV SOLN
4.0000 mg | Freq: Once | INTRAVENOUS | Status: AC
  Administered 2021-06-29: 4 mg via INTRAVENOUS
  Filled 2021-06-29: qty 1

## 2021-06-29 MED ORDER — POLYETHYLENE GLYCOL 3350 17 G PO PACK
17.0000 g | PACK | Freq: Every day | ORAL | Status: DC
  Administered 2021-06-29: 17 g via ORAL
  Filled 2021-06-29 (×5): qty 1

## 2021-06-29 MED ORDER — MAGNESIUM SULFATE 4 GM/100ML IV SOLN
4.0000 g | Freq: Once | INTRAVENOUS | Status: AC
  Administered 2021-06-29: 4 g via INTRAVENOUS
  Filled 2021-06-29: qty 100

## 2021-06-29 MED ORDER — GUAIFENESIN ER 600 MG PO TB12
600.0000 mg | ORAL_TABLET | Freq: Two times a day (BID) | ORAL | Status: DC
Start: 2021-06-29 — End: 2021-07-03
  Administered 2021-06-29 – 2021-07-03 (×9): 600 mg via ORAL
  Filled 2021-06-29 (×9): qty 1

## 2021-06-29 MED ORDER — MORPHINE SULFATE (PF) 2 MG/ML IV SOLN
1.0000 mg | INTRAVENOUS | Status: DC | PRN
  Administered 2021-06-29 – 2021-06-30 (×5): 1 mg via INTRAVENOUS
  Filled 2021-06-29 (×6): qty 1

## 2021-06-29 MED ORDER — ADULT MULTIVITAMIN W/MINERALS CH
1.0000 | ORAL_TABLET | Freq: Every day | ORAL | Status: DC
  Administered 2021-06-29 – 2021-07-03 (×5): 1 via ORAL
  Filled 2021-06-29 (×6): qty 1

## 2021-06-29 MED ORDER — ACETAMINOPHEN 650 MG RE SUPP: 650.0000 mg | Freq: Four times a day (QID) | RECTAL | Status: DC | PRN

## 2021-06-29 MED ORDER — SODIUM CHLORIDE 0.9 % IV BOLUS
1000.0000 mL | Freq: Once | INTRAVENOUS | Status: AC
  Administered 2021-06-29: 1000 mL via INTRAVENOUS

## 2021-06-29 NOTE — ED Notes (Signed)
Respiratory at bedside.

## 2021-06-29 NOTE — Hospital Course (Signed)
66 y.o. female with medical history significant for COPD, chronic hypoxic respiratory failure, lung cancer status post right upper lobectomy, protein calorie malnutrition, and chronic pain, now presenting to the emergency department with epigastric pain, nausea, and vomiting.  She suffers from chronic nausea and pain but symptoms have been worse since 06/26/2021.  She denies fevers.  She had a loose stool yesterday.  She reports a history of pancreatitis but states that this is different.  No change in chronic cough or dyspnea. ?  ?ED Course: Upon arrival to the ED, patient is found to be afebrile, saturating well on her usual supplemental oxygen, slightly tachycardic, and with stable blood pressure.  Chemistry panel notable for potassium 2.9, anion gap 18, alkaline phosphatase 149, albumin 2.5, and total bilirubin 1.4.  Patient was given a liter of IV fluids, 10 mill equivalents IV potassium, morphine, Toradol, and Zofran in the ED. ?

## 2021-06-29 NOTE — Progress Notes (Signed)
ASSUMPTION OF CARE NOTE  ? ?06/29/2021 ?4:22 PM ? ?Jasmine [name] was seen and examined.  The H&P by the admitting provider, orders, imaging was reviewed.  Please see new orders.  Will continue to follow.  ? ?Vitals:  ? 06/29/21 1020 06/29/21 1252  ?BP: (!) 157/95 110/69  ?Pulse: (!) 115 (!) 114  ?Resp: 18 18  ?Temp: 98.6 ?F (37 ?C) 98.3 ?F (36.8 ?C)  ?SpO2: 100% 100%  ? ? ?Results for orders placed or performed during the hospital encounter of 06/28/21  ?CBC with Differential  ?Result Value Ref Range  ? WBC 6.2 4.0 - 10.5 K/uL  ? RBC 3.40 (L) 3.87 - 5.11 MIL/uL  ? Hemoglobin 11.9 (L) 12.0 - 15.0 g/dL  ? HCT 38.0 36.0 - 46.0 %  ? MCV 111.8 (H) 80.0 - 100.0 fL  ? MCH 35.0 (H) 26.0 - 34.0 pg  ? MCHC 31.3 30.0 - 36.0 g/dL  ? RDW 14.5 11.5 - 15.5 %  ? Platelets 329 150 - 400 K/uL  ? nRBC 0.0 0.0 - 0.2 %  ? Neutrophils Relative % 86 %  ? Neutro Abs 5.4 1.7 - 7.7 K/uL  ? Lymphocytes Relative 9 %  ? Lymphs Abs 0.5 (L) 0.7 - 4.0 K/uL  ? Monocytes Relative 3 %  ? Monocytes Absolute 0.2 0.1 - 1.0 K/uL  ? Eosinophils Relative 0 %  ? Eosinophils Absolute 0.0 0.0 - 0.5 K/uL  ? Basophils Relative 1 %  ? Basophils Absolute 0.0 0.0 - 0.1 K/uL  ? Immature Granulocytes 1 %  ? Abs Immature Granulocytes 0.03 0.00 - 0.07 K/uL  ?Comprehensive metabolic panel  ?Result Value Ref Range  ? Sodium 141 135 - 145 mmol/L  ? Potassium 2.9 (L) 3.5 - 5.1 mmol/L  ? Chloride 99 98 - 111 mmol/L  ? CO2 24 22 - 32 mmol/L  ? Glucose, Bld 143 (H) 70 - 99 mg/dL  ? BUN 7 (L) 8 - 23 mg/dL  ? Creatinine, Ser 0.57 0.44 - 1.00 mg/dL  ? Calcium 8.5 (L) 8.9 - 10.3 mg/dL  ? Total Protein 6.3 (L) 6.5 - 8.1 g/dL  ? Albumin 2.5 (L) 3.5 - 5.0 g/dL  ? AST 30 15 - 41 U/L  ? ALT 19 0 - 44 U/L  ? Alkaline Phosphatase 149 (H) 38 - 126 U/L  ? Total Bilirubin 1.4 (H) 0.3 - 1.2 mg/dL  ? GFR, Estimated >60 >60 mL/min  ? Anion gap 18 (H) 5 - 15  ?Lipase, blood  ?Result Value Ref Range  ? Lipase 16 11 - 51 U/L  ?Comprehensive metabolic panel  ?Result Value Ref Range  ? Sodium  144 135 - 145 mmol/L  ? Potassium 3.6 3.5 - 5.1 mmol/L  ? Chloride 105 98 - 111 mmol/L  ? CO2 23 22 - 32 mmol/L  ? Glucose, Bld 98 70 - 99 mg/dL  ? BUN 9 8 - 23 mg/dL  ? Creatinine, Ser 0.70 0.44 - 1.00 mg/dL  ? Calcium 7.9 (L) 8.9 - 10.3 mg/dL  ? Total Protein 5.5 (L) 6.5 - 8.1 g/dL  ? Albumin 2.2 (L) 3.5 - 5.0 g/dL  ? AST 25 15 - 41 U/L  ? ALT 18 0 - 44 U/L  ? Alkaline Phosphatase 124 38 - 126 U/L  ? Total Bilirubin 1.1 0.3 - 1.2 mg/dL  ? GFR, Estimated >60 >60 mL/min  ? Anion gap 16 (H) 5 - 15  ?Magnesium  ?Result Value Ref Range  ? Magnesium 1.5 (  L) 1.7 - 2.4 mg/dL  ?CBC  ?Result Value Ref Range  ? WBC 6.0 4.0 - 10.5 K/uL  ? RBC 2.95 (L) 3.87 - 5.11 MIL/uL  ? Hemoglobin 10.4 (L) 12.0 - 15.0 g/dL  ? HCT 34.0 (L) 36.0 - 46.0 %  ? MCV 115.3 (H) 80.0 - 100.0 fL  ? MCH 35.3 (H) 26.0 - 34.0 pg  ? MCHC 30.6 30.0 - 36.0 g/dL  ? RDW 14.6 11.5 - 15.5 %  ? Platelets 259 150 - 400 K/uL  ? nRBC 0.0 0.0 - 0.2 %  ?Basic metabolic panel  ?Result Value Ref Range  ? Sodium 144 135 - 145 mmol/L  ? Potassium 4.1 3.5 - 5.1 mmol/L  ? Chloride 107 98 - 111 mmol/L  ? CO2 25 22 - 32 mmol/L  ? Glucose, Bld 113 (H) 70 - 99 mg/dL  ? BUN 9 8 - 23 mg/dL  ? Creatinine, Ser 0.70 0.44 - 1.00 mg/dL  ? Calcium 7.9 (L) 8.9 - 10.3 mg/dL  ? GFR, Estimated >60 >60 mL/min  ? Anion gap 12 5 - 15  ? ?C. Wynetta Emery, MD ?[facility] ? ? 06/28/2021 10:20 PM ?How to contact the [facility] Attending or Consulting provider [place] or covering provider during after hours [place], for this patient?  ?Check the care team in [facility] and look for a) attending/consulting [facility] provider listed and b) the [facility] team listed ?Log into [email or web address] and use [place]'s universal password to access. If you do not have the password, please contact the hospital operator. ?Locate the [facility] provider you are looking for under [facility] and page to a number that you can be directly reached. ?If you still have difficulty reaching the provider, please page the [facility] (Director on Call)  for the Hospitalists listed on amion for assistance. ? ?

## 2021-06-29 NOTE — Progress Notes (Signed)
Initial Nutrition Assessment ? ?DOCUMENTATION CODES:  ? ?Severe malnutrition in context of chronic illness ? ?INTERVENTION:  ?Boost Breeze po TID.  ? ?Multivitamin daily  ? ?When diet is advanced to full liquids-start Ensure  ? ?Recommend GI consult ? ?NUTRITION DIAGNOSIS:  ? ?Severe Malnutrition related to chronic illness (Lung Cancer, COPD, Chronic pancreatitis) as evidenced by percent weight loss (20% x 4 mo), severe fat depletion, severe muscle depletion, BMI-14.35. ? ? ?GOAL:  ?Patient will meet greater than or equal to 90% of their needs ? ?MONITOR:  ?Diet advancement, PO intake, Weight trends, Skin, Supplement acceptance, Labs ? ?REASON FOR ASSESSMENT:  ? ?Consult ?Assessment of nutrition requirement/status ? ?ASSESSMENT: Patient is a 66 yo female with hx of malnutrition, lung cancer, GERD, COPD, chronic back pain,chronic pancreatitis. Presents with nausea and vomiting, abdominal pain.  ? ?Hospitalized in mid-December and January: complaining of nausea/vomiting. Discharged on soft foods.Patient has hx of malnutrition which is ongoing. Suboptimal intake ongoing nausea and vomiting and chronic increased energy and protein needs with lung disease, chronic pancreatitis. ? ?Currently receiving Clear liquids. Minimal intake which is not new for her. Question if she takes pancreatic enzymes at home? Is she a candidate for PEJ-tube feeding given her chronically poor nutritional status? Consider GI consult.  ? ?Weights: severe wt loss since December ~ 10 kg (20%) x 4 months.  ? ?Medications: prn compazine,  ? ?IVF- NS@ 75 ml/hr.  ? ?Labs reviewed.   ? ?  Latest Ref Rng & Units 06/29/2021  ?  5:59 AM 06/28/2021  ? 10:12 PM 06/27/2021  ? 12:32 PM  ?BMP  ?Glucose 70 - 99 mg/dL 98   143   92    ?BUN 8 - 23 mg/dL 9   7   7     ?Creatinine 0.44 - 1.00 mg/dL 0.70   0.57   0.44    ?Sodium 135 - 145 mmol/L 144   141   142    ?Potassium 3.5 - 5.1 mmol/L 3.6   2.9   2.9    ?Chloride 98 - 111 mmol/L 105   99   107    ?CO2 22 - 32  mmol/L 23   24   22     ?Calcium 8.9 - 10.3 mg/dL 7.9   8.5   8.2    ?  ? ?NUTRITION - FOCUSED PHYSICAL EXAM: ?Nutrition-Focused physical exam completed. Findings are severe thoracic, orbital fat depletion, moderate temporal, severe clavicle, deltoid, patellar, calf muscle depletion, and no edema.   ? ? ?Diet Order:   ?Diet Order   ? ?       ?  Diet clear liquid Room service appropriate? Yes; Fluid consistency: Thin  Diet effective now       ?  ? ?  ?  ? ?  ? ? ?EDUCATION NEEDS:  ?Education needs have been addressed ? ?Skin:  Skin Assessment: Skin Integrity Issues: ?Skin Integrity Issues:: Stage I ?Stage I: bilateral coccyx ? ?Last BM:  4/24 ? ?Height:  ? ?Ht Readings from Last 1 Encounters:  ?06/28/21 5\' 3"  (1.6 m)  ? ? ?Weight:  ? ?Wt Readings from Last 1 Encounters:  ?06/28/21 36.7 kg  ? ? ?Ideal Body Weight:   52 kg ? ?BMI:  Body mass index is 14.35 kg/m?. ? ?Estimated Nutritional Needs:  ? ?Kcal:  1400-1500 ? ?Protein:  70-75 gr ? ?Fluid:  > 1000 ml ? ?Colman Cater MS,RD,CSG,LDN ?Contact: AMION  ? ?

## 2021-06-29 NOTE — ED Provider Notes (Signed)
?[facility] ?Provider Note ? ? ?CSN: [id number] ?Arrival date & time: 06/28/21  2151 ? ?  ? ?History ? ?Chief Complaint  ?Patient presents with  ? Emesis  ? ? ?Jasmine [name] is a 66 y.o. female. ? ?Patient is a 66 year old female with past medical history of hypertension, chronic back pain, COPD, recurrent episodes of nausea/vomiting.  Patient presenting today with complaints of nausea and vomiting.  This started earlier today.  She reports episodes like this in the past and has Phenergan suppositories at home which she uses, but did not help today.  She denies any bloody stool or vomit.  She denies any fevers or chills.  She does report some diarrhea this morning.  She denies any ill contacts or having consumed any suspicious foods.  This feels similar to her previous episodes. ? ?The history is provided by the patient.  ?Emesis ?Severity:  Moderate ?Duration:  12 hours ?Timing:  Constant ?Quality:  Stomach contents ?Progression:  Worsening ?Chronicity:  New ?Recent urination:  Normal ?Relieved by:  Nothing ?Worsened by:  Nothing ?Ineffective treatments:  None tried ? ?  ? ?Home Medications ?Prior to Admission medications   ?Medication Sig Start Date End Date Taking? Authorizing Provider  ?albuterol (VENTOLIN HFA) 108 (90 Base) MCG/ACT inhaler USE 2 INHALATIONS EVERY 6 HOURS AS NEEDED FOR WHEEZING OR SHORTNESS OF BREATH (NEED TO BE SEEN BEFORE NEXT REFILL) 03/30/21   Ronnie Doss M, DO  ?amLODipine (NORVASC) 10 MG tablet Take 1 tablet (10 mg total) by mouth daily. 03/15/21   Johnson, Clanford L, MD  ?atorvastatin (LIPITOR) 40 MG tablet Take 1 tablet (40 mg total) by mouth daily. 07/09/20   Janora Norlander, DO  ?budesonide-formoterol (SYMBICORT) 160-4.5 MCG/ACT inhaler USE 2 INHALATIONS TWICE A DAY 01/17/21   Janora Norlander, DO  ?Cholecalciferol 125 MCG (5000 UT) capsule Take 5,000 Units by mouth daily.    [provider]  ?dextromethorphan-guaiFENesin (MUCINEX DM) 30-600 MG  12hr tablet Take 1 tablet by mouth 2 (two) times daily.    [provider]  ?DULoxetine (CYMBALTA) 30 MG capsule Take 3 capsules (90 mg total) by mouth daily. 07/09/20   Janora Norlander, DO  ?Iron Polysacch Cmplx-B12-FA (POLY-IRON 150 FORTE) 150-0.025-1 MG CAPS Take 1 capsule by mouth daily. 03/14/21   Murlean Iba, MD  ?L-Methylfolate-Algae-B12-B6 (METANX) 3-90.314-2-35 MG CAPS TAKE 1 TABLET BY MOUTH TWO TIMES A DAY ?Patient taking differently: Take 1 tablet by mouth daily as needed. 09/10/19   Janora Norlander, DO  ?lisinopril (ZESTRIL) 5 MG tablet Take 1 tablet (5 mg total) by mouth daily. 03/15/21   Johnson, Clanford L, MD  ?magnesium oxide (MAG-OX) 400 (241.3 Mg) MG tablet Take 400 mg by mouth 2 (two) times daily. 02/07/20   [provider]  ?nicotine (NICODERM CQ) 14 mg/24hr patch Place 1 patch (14 mg total) onto the skin daily. 06/23/20   Janora Norlander, DO  ?Nutritional Supplements (ENSURE HIGH PROTEIN) LIQD Drink 1 can 3 times daily. 08/30/20   Janora Norlander, DO  ?omeprazole (PRILOSEC) 40 MG capsule Take one capsule once to twice daily before a meal. 01/18/21   Mahala Menghini, PA-C  ?ondansetron (ZOFRAN ODT) 4 MG disintegrating tablet Take 1 tablet (4 mg total) by mouth every 8 (eight) hours as needed for nausea or vomiting. 09/12/20   Maudie Flakes, MD  ?oxybutynin (DITROPAN-XL) 5 MG 24 hr tablet TAKE 1 TABLET(5 MG) BY MOUTH AT BEDTIME 05/20/21   Hassell Done, Mary-Margaret,  FNP  ?oxyCODONE-acetaminophen (PERCOCET) 10-325 MG tablet Take 1 tablet by mouth 5 (five) times daily. 08/27/20   [provider]  ?OXYCONTIN 15 MG 12 hr tablet Take 15 mg by mouth every 12 (twelve) hours. 08/28/20   [provider]  ?OXYGEN Inhale 4 L into the lungs See admin instructions. continuous    [provider]  ?polyethylene glycol (MIRALAX) 17 g packet Take 17 g by mouth daily. 03/14/21   Murlean Iba, MD  ?promethazine (PHENERGAN) 25 MG suppository Place 1  suppository (25 mg total) rectally every 6 (six) hours as needed for nausea or vomiting. 06/07/21   Ronnie Doss M, DO  ?SPIRIVA RESPIMAT 2.5 MCG/ACT AERS USE 2 INHALATIONS DAILY ?Patient taking differently: Inhale 2 puffs into the lungs daily. 07/13/20   Tanda Rockers, MD  ?terbinafine (LAMISIL) 250 MG tablet Take 1 tablet (250 mg total) by mouth daily. 05/19/21   Chevis Pretty, FNP  ?tizanidine (ZANAFLEX) 6 MG capsule Take 6 mg by mouth 3 (three) times daily. 08/27/20   [provider]  ?traZODone (DESYREL) 150 MG tablet TAKE 1 TO 2 TABLETS AT BEDTIME 03/30/21   Janora Norlander, DO  ?   ? ?Allergies    ?Lyrica [pregabalin], Darvon [propoxyphene], Gabapentin, Augmentin [amoxicillin-pot clavulanate], Erythromycin, and Vibramycin [doxycycline calcium]   ? ?Review of Systems   ?Review of Systems  ?Gastrointestinal:  Positive for vomiting.  ?All other systems reviewed and are negative. ? ?Physical Exam ?Updated Vital Signs ?BP (!) 149/101   Pulse (!) 122   Temp 97.7 ?F (36.5 ?C)   Resp 18   Ht 5\' 3"  (1.6 m)   Wt 36.7 kg   SpO2 100%   BMI 14.35 kg/m?  ?Physical Exam ?Vitals and nursing note reviewed.  ?Constitutional:   ?   General: She is not in acute distress. ?   Appearance: She is well-developed. She is not diaphoretic.  ?HENT:  ?   Head: Normocephalic and atraumatic.  ?Cardiovascular:  ?   Rate and Rhythm: Normal rate and regular rhythm.  ?   Heart sounds: No murmur heard. ?  No friction rub. No gallop.  ?Pulmonary:  ?   Effort: Pulmonary effort is normal. No respiratory distress.  ?   Breath sounds: Normal breath sounds. No wheezing.  ?Abdominal:  ?   General: Bowel sounds are normal. There is no distension.  ?   Palpations: Abdomen is soft.  ?   Tenderness: There is no abdominal tenderness.  ?Musculoskeletal:     ?   General: Normal range of motion.  ?   Cervical back: Normal range of motion and neck supple.  ?Skin: ?   General: Skin is warm and dry.  ?Neurological:  ?   General: No  focal deficit present.  ?   Mental Status: She is alert and oriented to person, place, and time.  ? ? ?ED Results / Procedures / Treatments   ?Labs ?(all labs ordered are listed, but only abnormal results are displayed) ?Labs Reviewed  ?CBC WITH DIFFERENTIAL/PLATELET - Abnormal; Notable for the following components:  ?    Result Value  ? RBC 3.40 (*)   ? Hemoglobin 11.9 (*)   ? MCV 111.8 (*)   ? MCH 35.0 (*)   ? Lymphs Abs 0.5 (*)   ? All other components within normal limits  ?COMPREHENSIVE METABOLIC PANEL - Abnormal; Notable for the following components:  ? Potassium 2.9 (*)   ? Glucose, Bld 143 (*)   ?  BUN 7 (*)   ? Calcium 8.5 (*)   ? Total Protein 6.3 (*)   ? Albumin 2.5 (*)   ? Alkaline Phosphatase 149 (*)   ? Total Bilirubin 1.4 (*)   ? Anion gap 18 (*)   ? All other components within normal limits  ?LIPASE, BLOOD  ? ? ?EKG ?None ? ?Radiology ?CT Chest W Contrast ? ?Result Date: 06/27/2021 ?CLINICAL DATA:  Non-small cell lung cancer restaging, status post right upper lobectomy EXAM: CT CHEST WITH CONTRAST TECHNIQUE: Multidetector CT imaging of the chest was performed during intravenous contrast administration. RADIATION DOSE REDUCTION: This exam was performed according to the departmental dose-optimization program which includes automated exposure control, adjustment of the mA and/or kV according to patient size and/or use of iterative reconstruction technique. CONTRAST:  15mL OMNIPAQUE IOHEXOL 300 MG/ML  SOLN COMPARISON:  02/21/2021 FINDINGS: Cardiovascular: Aortic atherosclerosis. Normal heart size. Three-vessel coronary artery calcifications. No pericardial effusion. Mediastinum/Nodes: No enlarged mediastinal, hilar, or axillary lymph nodes. Thyroid gland, trachea, and esophagus demonstrate no significant findings. Lungs/Pleura: Unchanged postoperative findings of right upper lobectomy. Severe emphysema. Unchanged 0.3 cm nodule of the anterior left upper lobe (series 4, image 36). No pleural effusion or  pneumothorax. Upper Abdomen: No acute abnormality. Hepatic steatosis. Partially imaged intra and extrahepatic biliary ductal dilatation. Musculoskeletal: No chest wall abnormality. No suspicious osseous lesions identified

## 2021-06-29 NOTE — TOC Progression Note (Signed)
?  Transition of Care (TOC) Screening Note ? ? ?Patient Details  ?Name: Jasmine [name] ?Date of Birth: [birth year] ? ? ?Transition of Care (TOC) CM/SW Contact:    ?Boneta Lucks, RN ?Phone Number: ?06/29/2021, 12:19 PM ? ? ? ?Transition of Care Department [facility]) has reviewed patient and no TOC needs have been identified at this time. We will continue to monitor patient advancement through interdisciplinary progression rounds. If new patient transition needs arise, please place a TOC consult. ? ? ? ?Expected Discharge Plan: Home/Self Care ?Barriers to Discharge: Continued Medical Work up ? ?Expected Discharge Plan and Services ?Expected Discharge Plan: Home/Self Care ?  ?  ?  ?  ?                ?  ?  ?  ?  ?  ?  ?  ?  ?  ?  ? ?

## 2021-06-29 NOTE — H&P (Signed)
?History and Physical  ? ? ?Jasmine [name] GHW:[id number] DOB: 02-02-1956 DOA: 06/28/2021 ? ?PCP: Janora Norlander, DO  ? ?Patient coming from: Home  ? ?Chief Complaint: Epigastric pain, N/V  ? ?HPI: Jasmine [name] is a pleasant 66 y.o. female with medical history significant for COPD, chronic hypoxic respiratory failure, lung cancer status post right upper lobectomy, protein calorie malnutrition, and chronic pain, now presenting to the emergency department with epigastric pain, nausea, and vomiting.  She suffers from chronic nausea and pain but symptoms have been worse since 06/26/2021.  She denies fevers.  She had a loose stool yesterday.  She reports a history of pancreatitis but states that this is different.  No change in chronic cough or dyspnea. ? ?ED Course: Upon arrival to the ED, patient is found to be afebrile, saturating well on her usual supplemental oxygen, slightly tachycardic, and with stable blood pressure.  Chemistry panel notable for potassium 2.9, anion gap 18, alkaline phosphatase 149, albumin 2.5, and total bilirubin 1.4.  Patient was given a liter of IV fluids, 10 mill equivalents IV potassium, morphine, Toradol, and Zofran in the ED. ? ?Review of Systems:  ?All other systems reviewed and apart from HPI, are negative. ? ?Past Medical History:  ?Diagnosis Date  ? Allergy   ? Anemia   ? Anxiety   ? Arthritis   ? Asthma   ? Carpal tunnel syndrome   ? Chronic back pain   ? Cigarette smoker 08/25/2017  ? Complication of anesthesia   ? in the past has had N/V, the last few surgeries she has not been  ? COPD (chronic obstructive pulmonary disease) ([place])   ? Easy bruising 07/10/2016  ? GERD (gastroesophageal reflux disease)   ? Headache   ? Heart murmur 2005  ? never had any problems  ? History of blood transfusion 2018  ? History of kidney stones   ? Hypertension   ? Hypoglycemia   ? Hypoglycemia   ? Iron deficiency anemia 07/11/2016  ? Neuropathy   ? both arms  ? Normocytic anemia 07/29/2015  ?  Pancreatitis   ? Pneumonia   ? PONV (postoperative nausea and vomiting)   ? Postoperative anemia due to acute blood loss 11/15/2015  ? R lung cancer   ? Sciatica   ? Seizures ([place])   ? as a child when she would have an asthma attack. none as an adult  ? Shortness of breath dyspnea   ? ? ?Past Surgical History:  ?Procedure Laterality Date  ? ABDOMINAL HYSTERECTOMY  2005  ? APPLICATION OF ROBOTIC ASSISTANCE FOR SPINAL PROCEDURE N/A 09/05/2016  ? Procedure: APPLICATION OF ROBOTIC ASSISTANCE FOR SPINAL PROCEDURE;  Surgeon: Consuella Lose, MD;  Location: [facility];  Service: Neurosurgery;  Laterality: N/A;  ? BACK SURGERY    ? BIOPSY  06/14/2020  ? Procedure: BIOPSY;  Surgeon: Daneil Dolin, MD;  [facility];  Service: Endoscopy;;  ? CERVICAL FUSION    ? [place]  ? COLONOSCOPY    ? unsure last  ? COLONOSCOPY WITH PROPOFOL N/A 06/14/2020  ? Procedure: COLONOSCOPY WITH PROPOFOL;  Surgeon: Daneil Dolin, MD;  [facility];  Service: Endoscopy;  Laterality: N/A;  PM  ? cyst removal face    ? disk repair    ? neck and lumbar region  ? ESOPHAGOGASTRODUODENOSCOPY (EGD) WITH PROPOFOL N/A 06/14/2020  ? Procedure: ESOPHAGOGASTRODUODENOSCOPY (EGD) WITH PROPOFOL;  Surgeon: Daneil Dolin, MD;  [facility];  Service: Endoscopy;  Laterality: N/A;  ? FOOT SURGERY Bilateral   ? to file bones down   ? HARDWARE REMOVAL N/A 09/05/2016  ? Procedure: HARDWARE REMOVAL AND REPLACEMENT OF LUMBAR FIVE SCREWS;  Surgeon: Consuella Lose, MD;  Location: [facility];  Service: Neurosurgery;  Laterality: N/A;  ? IMPLANTATION / PLACEMENT EPIDURAL NEUROSTIMULATOR ELECTRODES    ? INTERCOSTAL NERVE BLOCK Right 07/29/2020  ? Procedure: INTERCOSTAL NERVE BLOCK;  Surgeon: Melrose Nakayama, MD;  Location: [place] [facility];  Service: Thoracic;  Laterality: Right;  ? LAMINECTOMY    ? 2006  ? lobectomy Right 07/2020  ? LUMBAR FUSION    ? LYMPH NODE BIOPSY Right 07/29/2020  ? Procedure: LYMPH NODE BIOPSY;  Surgeon:  Melrose Nakayama, MD;  Location: [place] [facility];  Service: Thoracic;  Laterality: Right;  ? POLYPECTOMY  06/14/2020  ? Procedure: POLYPECTOMY INTESTINAL;  Surgeon: Daneil Dolin, MD;  [facility];  Service: Endoscopy;;  ? SPINAL CORD STIMULATOR BATTERY EXCHANGE    ? SPINAL CORD STIMULATOR INSERTION N/A 07/05/2016  ? Procedure: REPLACEMENT OF LUMBAR SPINAL CORD STIMULATOR BATTERY;  Surgeon: Newman Pies, MD;  Location: [facility];  Service: Neurosurgery;  Laterality: N/A;  ? TONSILLECTOMY    ? ? ?Social History:  ? reports that she has been smoking cigarettes. She has a 8.75 pack-year smoking history. She has never used smokeless tobacco. She reports current alcohol use of about 2.0 standard drinks per week. She reports that she does not use drugs. ? ?Allergies  ?Allergen Reactions  ? Lyrica [Pregabalin] Other (See Comments)  ?  EXTREME SHAKING/TREMBLING  ? Darvon [Propoxyphene]   ?  UNSPECIFIED REACTION   ? Gabapentin   ?  Extreme shaking and trembling   ? Augmentin [Amoxicillin-Pot Clavulanate] Nausea And Vomiting  ?  Has patient had a PCN reaction causing immediate rash, facial/tongue/throat swelling, SOB or lightheadedness with hypotension:no ?Has patient had a PCN reaction causing severe rash involving mucus membranes or skin necrosis:No ?Has patient had a PCN reaction that required hospitalization:No ?Has patient had a PCN reaction occurring within the last 10 years:Yes--ONLY N/V ?If all of the above answers are "NO", then may proceed with Cephalosporin use. ?  ? Erythromycin Itching and Rash  ?   ?  ? Vibramycin [Doxycycline Calcium] Itching and Rash  ?  "SPLOTCHING "  ? ? ?Family History  ?Problem Relation Age of Onset  ? Heart disease Mother   ? Diabetes Mother   ? Hypertension Mother   ? Hyperlipidemia Mother   ? COPD Mother   ?     smoked  ? Cancer Mother   ?     bile duct  ? COPD Father   ? Diabetes Father   ? Heart disease Father   ? Cancer Father   ?     throat  ? Dementia Father   ? Asthma Son    ? Hypertension Sister   ? Hyperlipidemia Sister   ? Hypertension Brother   ? Hyperlipidemia Brother   ? Hypertension Brother   ? Hyperlipidemia Brother   ? Emphysema Maternal Grandmother   ?     smoked  ? Colon cancer Neg Hx   ? Colon polyps Neg Hx   ? ? ? ?Prior to Admission medications   ?Medication Sig Start Date End Date Taking? Authorizing Provider  ?albuterol (VENTOLIN HFA) 108 (90 Base) MCG/ACT inhaler USE 2 INHALATIONS EVERY 6 HOURS AS NEEDED FOR WHEEZING OR SHORTNESS OF BREATH (NEED TO BE SEEN  BEFORE NEXT REFILL) 03/30/21   Ronnie Doss M, DO  ?amLODipine (NORVASC) 10 MG tablet Take 1 tablet (10 mg total) by mouth daily. 03/15/21   Johnson, Clanford L, MD  ?atorvastatin (LIPITOR) 40 MG tablet Take 1 tablet (40 mg total) by mouth daily. 07/09/20   Janora Norlander, DO  ?budesonide-formoterol (SYMBICORT) 160-4.5 MCG/ACT inhaler USE 2 INHALATIONS TWICE A DAY 01/17/21   Janora Norlander, DO  ?Cholecalciferol 125 MCG (5000 UT) capsule Take 5,000 Units by mouth daily.    [provider]  ?dextromethorphan-guaiFENesin (MUCINEX DM) 30-600 MG 12hr tablet Take 1 tablet by mouth 2 (two) times daily.    [provider]  ?DULoxetine (CYMBALTA) 30 MG capsule Take 3 capsules (90 mg total) by mouth daily. 07/09/20   Janora Norlander, DO  ?Iron Polysacch Cmplx-B12-FA (POLY-IRON 150 FORTE) 150-0.025-1 MG CAPS Take 1 capsule by mouth daily. 03/14/21   Murlean Iba, MD  ?L-Methylfolate-Algae-B12-B6 (METANX) 3-90.314-2-35 MG CAPS TAKE 1 TABLET BY MOUTH TWO TIMES A DAY ?Patient taking differently: Take 1 tablet by mouth daily as needed. 09/10/19   Janora Norlander, DO  ?lisinopril (ZESTRIL) 5 MG tablet Take 1 tablet (5 mg total) by mouth daily. 03/15/21   Johnson, Clanford L, MD  ?magnesium oxide (MAG-OX) 400 (241.3 Mg) MG tablet Take 400 mg by mouth 2 (two) times daily. 02/07/20   [provider]  ?nicotine (NICODERM CQ) 14 mg/24hr patch Place 1 patch (14 mg total) onto the skin daily.  06/23/20   Janora Norlander, DO  ?Nutritional Supplements (ENSURE HIGH PROTEIN) LIQD Drink 1 can 3 times daily. 08/30/20   Janora Norlander, DO  ?omeprazole (PRILOSEC) 40 MG capsule Take one capsule once to

## 2021-06-30 DIAGNOSIS — R112 Nausea with vomiting, unspecified: Secondary | ICD-10-CM | POA: Diagnosis present

## 2021-06-30 DIAGNOSIS — R109 Unspecified abdominal pain: Secondary | ICD-10-CM | POA: Diagnosis not present

## 2021-06-30 DIAGNOSIS — E43 Unspecified severe protein-calorie malnutrition: Secondary | ICD-10-CM | POA: Diagnosis present

## 2021-06-30 DIAGNOSIS — G629 Polyneuropathy, unspecified: Secondary | ICD-10-CM | POA: Diagnosis present

## 2021-06-30 DIAGNOSIS — Z7951 Long term (current) use of inhaled steroids: Secondary | ICD-10-CM | POA: Diagnosis not present

## 2021-06-30 DIAGNOSIS — J449 Chronic obstructive pulmonary disease, unspecified: Secondary | ICD-10-CM | POA: Diagnosis present

## 2021-06-30 DIAGNOSIS — G894 Chronic pain syndrome: Secondary | ICD-10-CM | POA: Diagnosis present

## 2021-06-30 DIAGNOSIS — J9611 Chronic respiratory failure with hypoxia: Secondary | ICD-10-CM | POA: Diagnosis present

## 2021-06-30 DIAGNOSIS — R64 Cachexia: Secondary | ICD-10-CM | POA: Diagnosis present

## 2021-06-30 DIAGNOSIS — Z9981 Dependence on supplemental oxygen: Secondary | ICD-10-CM | POA: Diagnosis not present

## 2021-06-30 DIAGNOSIS — Z681 Body mass index (BMI) 19 or less, adult: Secondary | ICD-10-CM | POA: Diagnosis not present

## 2021-06-30 DIAGNOSIS — K8681 Exocrine pancreatic insufficiency: Secondary | ICD-10-CM | POA: Diagnosis present

## 2021-06-30 DIAGNOSIS — Z885 Allergy status to narcotic agent status: Secondary | ICD-10-CM | POA: Diagnosis not present

## 2021-06-30 DIAGNOSIS — K861 Other chronic pancreatitis: Secondary | ICD-10-CM | POA: Diagnosis present

## 2021-06-30 DIAGNOSIS — Z8249 Family history of ischemic heart disease and other diseases of the circulatory system: Secondary | ICD-10-CM | POA: Diagnosis not present

## 2021-06-30 DIAGNOSIS — M549 Dorsalgia, unspecified: Secondary | ICD-10-CM | POA: Diagnosis present

## 2021-06-30 DIAGNOSIS — Z85118 Personal history of other malignant neoplasm of bronchus and lung: Secondary | ICD-10-CM | POA: Diagnosis not present

## 2021-06-30 DIAGNOSIS — R54 Age-related physical debility: Secondary | ICD-10-CM | POA: Diagnosis present

## 2021-06-30 DIAGNOSIS — I10 Essential (primary) hypertension: Secondary | ICD-10-CM | POA: Diagnosis present

## 2021-06-30 DIAGNOSIS — Z881 Allergy status to other antibiotic agents status: Secondary | ICD-10-CM | POA: Diagnosis not present

## 2021-06-30 DIAGNOSIS — K838 Other specified diseases of biliary tract: Secondary | ICD-10-CM | POA: Diagnosis present

## 2021-06-30 DIAGNOSIS — Z888 Allergy status to other drugs, medicaments and biological substances status: Secondary | ICD-10-CM | POA: Diagnosis not present

## 2021-06-30 DIAGNOSIS — Z79899 Other long term (current) drug therapy: Secondary | ICD-10-CM | POA: Diagnosis not present

## 2021-06-30 DIAGNOSIS — Z902 Acquired absence of lung [part of]: Secondary | ICD-10-CM | POA: Diagnosis not present

## 2021-06-30 DIAGNOSIS — E876 Hypokalemia: Secondary | ICD-10-CM | POA: Diagnosis present

## 2021-06-30 DIAGNOSIS — L89151 Pressure ulcer of sacral region, stage 1: Secondary | ICD-10-CM | POA: Diagnosis present

## 2021-06-30 DIAGNOSIS — R1110 Vomiting, unspecified: Secondary | ICD-10-CM | POA: Diagnosis not present

## 2021-06-30 LAB — CBC
HCT: 35.6 % — ABNORMAL LOW (ref 36.0–46.0)
Hemoglobin: 10.6 g/dL — ABNORMAL LOW (ref 12.0–15.0)
MCH: 33.5 pg (ref 26.0–34.0)
MCHC: 29.8 g/dL — ABNORMAL LOW (ref 30.0–36.0)
MCV: 112.7 fL — ABNORMAL HIGH (ref 80.0–100.0)
Platelets: 271 10*3/uL (ref 150–400)
RBC: 3.16 MIL/uL — ABNORMAL LOW (ref 3.87–5.11)
RDW: 14.3 % (ref 11.5–15.5)
WBC: 7.6 10*3/uL (ref 4.0–10.5)
nRBC: 0 % (ref 0.0–0.2)

## 2021-06-30 LAB — COMPREHENSIVE METABOLIC PANEL
ALT: 16 U/L (ref 0–44)
AST: 23 U/L (ref 15–41)
Albumin: 2.2 g/dL — ABNORMAL LOW (ref 3.5–5.0)
Alkaline Phosphatase: 114 U/L (ref 38–126)
Anion gap: 7 (ref 5–15)
BUN: 5 mg/dL — ABNORMAL LOW (ref 8–23)
CO2: 30 mmol/L (ref 22–32)
Calcium: 7.7 mg/dL — ABNORMAL LOW (ref 8.9–10.3)
Chloride: 106 mmol/L (ref 98–111)
Creatinine, Ser: 0.39 mg/dL — ABNORMAL LOW (ref 0.44–1.00)
GFR, Estimated: >60 mL/min (ref >60)
Glucose, Bld: 125 mg/dL — ABNORMAL HIGH (ref 70–99)
Potassium: 3.1 mmol/L — ABNORMAL LOW (ref 3.5–5.1)
Sodium: 143 mmol/L (ref 135–145)
Total Bilirubin: 0.7 mg/dL (ref 0.3–1.2)
Total Protein: 5.4 g/dL — ABNORMAL LOW (ref 6.5–8.1)

## 2021-06-30 LAB — MAGNESIUM: Magnesium: 1.8 mg/dL (ref 1.7–2.4)

## 2021-06-30 MED ORDER — POTASSIUM CHLORIDE CRYS ER 20 MEQ PO TBCR
40.0000 meq | EXTENDED_RELEASE_TABLET | Freq: Once | ORAL | Status: AC
  Administered 2021-06-30: 40 meq via ORAL
  Filled 2021-06-30: qty 2

## 2021-06-30 MED ORDER — OXYCODONE HCL ER 15 MG PO T12A
15.0000 mg | EXTENDED_RELEASE_TABLET | Freq: Two times a day (BID) | ORAL | Status: DC
  Administered 2021-07-01 – 2021-07-03 (×5): 15 mg via ORAL
  Filled 2021-06-30 (×6): qty 1

## 2021-06-30 MED ORDER — PANCRELIPASE (LIP-PROT-AMYL) 12000-38000 UNITS PO CPEP
24000.0000 [IU] | ORAL_CAPSULE | ORAL | Status: DC
Start: 2021-06-30 — End: 2021-07-03
  Administered 2021-06-30 – 2021-07-02 (×5): 24000 [IU] via ORAL
  Filled 2021-06-30 (×4): qty 2

## 2021-06-30 MED ORDER — MAGNESIUM SULFATE 4 GM/100ML IV SOLN
4.0000 g | Freq: Once | INTRAVENOUS | Status: AC
  Administered 2021-06-30: 4 g via INTRAVENOUS
  Filled 2021-06-30: qty 100

## 2021-06-30 MED ORDER — NICOTINE 14 MG/24HR TD PT24: 14.0000 mg | MEDICATED_PATCH | Freq: Every day | TRANSDERMAL | Status: DC | PRN

## 2021-06-30 MED ORDER — MORPHINE SULFATE (PF) 2 MG/ML IV SOLN
2.0000 mg | INTRAVENOUS | Status: DC | PRN
  Administered 2021-06-30 – 2021-07-02 (×12): 2 mg via INTRAVENOUS
  Filled 2021-06-30 (×12): qty 1

## 2021-06-30 MED ORDER — PANCRELIPASE (LIP-PROT-AMYL) 12000-38000 UNITS PO CPEP
48000.0000 [IU] | ORAL_CAPSULE | Freq: Three times a day (TID) | ORAL | Status: DC
Start: 2021-06-30 — End: 2021-07-03
  Administered 2021-06-30 – 2021-07-03 (×9): 48000 [IU] via ORAL
  Filled 2021-06-30 (×10): qty 4

## 2021-06-30 NOTE — Assessment & Plan Note (Addendum)
--  appreciate dietitian consultation and recommendations ?--resume home dietary supplements  ?

## 2021-06-30 NOTE — Progress Notes (Signed)
?PROGRESS NOTE ? ? ?Jasmine [name]  LEX:[id number] DOB: Jan 28, 1956 DOA: 06/28/2021 ?PCP: Janora Norlander, DO  ? ?Chief Complaint  ?Patient presents with  ? Emesis  ? ?Level of care: Med-Surg ? ?Brief Admission History:  ?66 y.o. female with medical history significant for COPD, chronic hypoxic respiratory failure, lung cancer status post right upper lobectomy, protein calorie malnutrition, and chronic pain, now presenting to the emergency department with epigastric pain, nausea, and vomiting.  She suffers from chronic nausea and pain but symptoms have been worse since 06/26/2021.  She denies fevers.  She had a loose stool yesterday.  She reports a history of pancreatitis but states that this is different.  No change in chronic cough or dyspnea. ?  ?ED Course: Upon arrival to the ED, patient is found to be afebrile, saturating well on her usual supplemental oxygen, slightly tachycardic, and with stable blood pressure.  Chemistry panel notable for potassium 2.9, anion gap 18, alkaline phosphatase 149, albumin 2.5, and total bilirubin 1.4.  Patient was given a liter of IV fluids, 10 mill equivalents IV potassium, morphine, Toradol, and Zofran in the ED. ?  ?Assessment and Plan: ?* Abdominal pain with vomiting ?-- given persistent CBD dilatation will ask for GI opinion ?-- unable to have MRI due to stimulator implanted  ? ?Chronic pain syndrome ?-- pain difficult to control, discussed with patient we may not be able to fully control all of her pain ? ?Pressure injury of skin ?-- continue skin care protocol  ? ?Intractable nausea and vomiting ?-- slowly improving with supportive measures  ? ?Protein-calorie malnutrition, severe ?--appreciate dietitian consultation and recommendations ? ?Hypokalemia ?-- repleted, give additional magnesium today, recheck in AM  ? ?COPD GOLD II  ?-- stable, no active symptoms of acute exacerbation  ? ?HTN (hypertension) ?-- well managed, following ? ?DVT prophylaxis:  ?Code Status: full   ?Family Communication:  ?Disposition: Status is: Inpatient ?Remains inpatient appropriate because: IV fluids, uncontrolled pain, subspecialty consultation  ?  ?Consultants:  ?GI service  ?Procedures:  ? ?Antimicrobials:  ?  ?Subjective: ?Pt complains pain not controlled.  ?Objective: ?Vitals:  ? 06/30/21 0436 06/30/21 0648 06/30/21 0731 06/30/21 1301  ?BP: (!) 172/92 (!) 170/90  108/69  ?Pulse: (!) 119 (!) 115  99  ?Resp: 19 20  18   ?Temp: 98.2 ?F (36.8 ?C) 98.4 ?F (36.9 ?C)  98.1 ?F (36.7 ?C)  ?TempSrc: Oral Oral  Oral  ?SpO2:   98% 100%  ?Weight: 37.3 kg     ?Height:      ? ? ?Intake/Output Summary (Last 24 hours) at 06/30/2021 1543 ?Last data filed at 06/30/2021 1300 ?Gross per 24 hour  ?Intake 1428.68 ml  ?Output 2550 ml  ?Net -1121.32 ml  ? ?Filed Weights  ? 06/28/21 2200 06/30/21 0436  ?Weight: 36.7 kg 37.3 kg  ? ?Examination: ? ?General exam: Appears calm and comfortable. Thin emaciated appearing chronically ill appearing female.    ?Respiratory system: Clear to auscultation. Respiratory effort normal. ?Cardiovascular system: normal S1 & S2 heard. No JVD, murmurs, rubs, gallops or clicks. No pedal edema. ?Gastrointestinal system: Abdomen is nondistended, soft and very mild epigastric tenderness. No organomegaly or masses felt. Normal bowel sounds heard. ?Central nervous system: Alert and oriented. No focal neurological deficits. ?Extremities: Symmetric 5 x 5 power. ?Skin: No rashes, lesions or ulcers. ?Psychiatry: Judgement and insight appear normal. Mood & affect appropriate.  ? ?Data Reviewed: I have personally reviewed following labs and imaging studies ? ?CBC: ?Recent Labs  ?Lab  06/27/21 ?1232 06/28/21 ?2212 06/29/21 ?0559 06/30/21 ?0422  ?WBC 7.8 6.2 6.0 7.6  ?NEUTROABS 5.6 5.4  --   --   ?HGB 10.0* 11.9* 10.4* 10.6*  ?HCT 32.7* 38.0 34.0* 35.6*  ?MCV 112.8* 111.8* 115.3* 112.7*  ?PLT 261 329 259 271  ? ? ?Basic Metabolic Panel: ?Recent Labs  ?Lab 06/27/21 ?1232 06/28/21 ?2212 06/29/21 ?0559  06/29/21 ?1425 06/30/21 ?0422  ?NA 142 141 144 144 143  ?K 2.9* 2.9* 3.6 4.1 3.1*  ?CL 107 99 105 107 106  ?CO2 22 24 23 25 30   ?GLUCOSE 92 143* 98 113* 125*  ?BUN 7* 7* 9 9 5*  ?CREATININE 0.44 0.57 0.70 0.70 0.39*  ?CALCIUM 8.2* 8.5* 7.9* 7.9* 7.7*  ?MG  --   --  1.5*  --  1.8  ? ? ?CBG: ?No results for input(s): GLUCAP in the last 168 hours. ? ?No results found for this or any previous visit (from the past 240 hour(s)).  ? ?Radiology Studies: ?US Abdomen Limited RUQ (LIVER/GB) ? ?Result Date: 06/29/2021 ?CLINICAL DATA:  Abdominal pain for 4 days EXAM: ULTRASOUND ABDOMEN LIMITED RIGHT UPPER QUADRANT COMPARISON:  06/27/2021 FINDINGS: Gallbladder: No cholelithiasis. Mild gallbladder wall thickening. Trace pericholecystic fluid. Negative sonographic Murphy sign. Common bile duct: Diameter: 13 mm Liver: No focal lesion identified. Increased hepatic parenchymal echogenicity. Portal vein is patent on color Doppler imaging with normal direction of blood flow towards the liver. Other: Incidental note made of a 11 mm renal cyst. IMPRESSION: No cholelithiasis. Mild gallbladder wall thickening and trace pericholecystic fluid. This appearance can be seen in the setting of hepatocellular disease. Dilated common bile duct measuring 13 mm without an obstructing mass or choledocholithiasis. If there is further clinical concern, recommend an MRCP and MRI of the abdomen. Electronically Signed   By: Kathreen Devoid M.D.   On: 06/29/2021 10:44   ? ?Scheduled Meds: ? amLODipine  10 mg Oral Daily  ? atorvastatin  40 mg Oral QPM  ? cholecalciferol  5,000 Units Oral Daily  ? DULoxetine  90 mg Oral Daily  ? enoxaparin (LOVENOX) injection  30 mg Subcutaneous Q24H  ? feeding supplement  1 Container Oral TID BM  ? guaiFENesin  600 mg Oral BID  ? iron polysaccharides  150 mg Oral Daily  ? lipase/protease/amylase  24,000 Units Oral With snacks  ? lipase/protease/amylase  48,000 Units Oral TID WC  ? mometasone-formoterol  2 puff Inhalation BID  ?  multivitamin with minerals  1 tablet Oral Daily  ? oxybutynin  5 mg Oral QHS  ? oxyCODONE  15 mg Oral Q12H  ? pantoprazole  40 mg Oral Daily  ? polyethylene glycol  17 g Oral Daily  ? Ensure Max Protein  1 Container Oral TID  ? umeclidinium bromide  1 puff Inhalation Daily  ? ?Continuous Infusions: ? 0.9 % NaCl with KCl 40 mEq / L 75 mL/hr at 06/30/21 0853  ? ? ? LOS: 0 days  ? ?Time spent: 36 mins ? ?Irwin Brakeman, MD ?How to contact the [facility] Attending or Consulting provider [place] or covering provider during after hours [place], [place], for this patient?  ?Check the care team in [facility] and look for a) attending/consulting [facility] provider listed and b) the [facility] team listed ?Log into [email or web address] and use [facility] universal password to access. If you do not have the password, please contact the hospital operator. ?Locate the [facility] provider you are looking for under [facility] and page to a number that  you can be directly reached. ?If you still have difficulty reaching the provider, please page the [facility] (Director on Call) for the Hospitalists listed on amion for assistance. ? ?06/30/2021, 3:43 PM  ? ? ?

## 2021-06-30 NOTE — Assessment & Plan Note (Addendum)
--   given persistent CBD dilatation will ask for GI opinion ?-- unable to have MRI due to stimulator implanted  ?-- GI reports patient has had dilated CBD for many years  ?-- GI started patient on supplemental pancreatic enzymes ?-- Tolerating Heart healthy diet.   ?

## 2021-06-30 NOTE — Assessment & Plan Note (Signed)
--   well managed, following ?

## 2021-06-30 NOTE — Consult Note (Signed)
? ? ?Gastroenterology Consult  ? ?Referring Provider: No ref. provider found ?Primary Care Physician:  Janora Norlander, DO ?Primary Gastroenterologist:  Dr. Gala Romney ? ?Patient ID: Jasmine [name]; [id number]; 01-May-1955  ? ?Admit date: 06/28/2021 ? LOS: 0 days  ? ?Date of Consultation: 06/30/2021 ? ?Reason for Consultation:  dilated CBD, N/V ?  ? ?History of Present Illness  ? ?Jasmine [name] is a 66 y.o. 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  ? ?Admitted in December with intractable abdominal pain and nausea with question of acute on chronic pancreatitis. Admitted in January 2023 with intractable nausea and vomiting, CT abdomen pelvis at that time with chronic inflammation of the head of the pancreas. ? ?She arrived at [facility], ED via EMS April 25, with reports of vomiting and diarrhea. This admission right upper quadrant ultrasound yesterday showed mild gallbladder wall thickening, no gallstones, CBD of 13 mm. ? ?Her last CT abdomen pelvis with contrast was dated March 11, 2021 with stable intrahepatic and extrahepatic biliary ductal dilation compared to September 2022, gallbladder unremarkable, pancreas with calcifications in the pancreatic head and uncinate process similar to prior study, 1 calcification in the region of the ampulla, pancreatic duct dilated, stable to prior study. ? ?In the ED: Hemoglobin noted to be 11.9 (baseline in the 10-11 range), MCV 111.8, platelets normal.  White blood cell count normal.  Total  bilirubin slightly elevated at 1.4 not differentiated, alk phos 149 up from 124 the day before, transaminases normal, albumin 2.5, creatinine 0.57, potassium 2.9.  Lipase 16.  Magnesium 1.5. ? ?Today: Hemoglobin 10.6, potassium 3.1, AST 23, ALT 16, alk phos 114, total bilirubin 0.7.  Magnesium 1.8. ? ?Consult: The patient was last seen by our service during her hospital admission in November 2020. She was admitted for uncomplicated pancreatitis for which she clinically improved with standard treatment.  CT findings included CBD dilation 14 mm, intrahepatic biliary ductal dilation, pancreatic duct dilation measuring 8 mm.  Not a candidate for MRI due to implanted neurostimulator.  LFTs normal.  Plans for outpatient EUS in 6 weeks after discharge. At that time she only cited occasional alcohol use a couple times per month.  Today she states she has never had daily or heavy use.  In January 2022 she had alcohol level of 40.   ? ?EUS was initially scheduled for 02/20/2019.  However, she called 02/06/2019 to cancel because of problems with COPD. Patient last seen by our practice in 04/2020. At that time she requested to hold off EUS for awhile due to work up of pulmonary nodule pending.  Subsequently diagnosed with lung cancer. ? ?She states that she has intermittent bouts of nausea/vomiting prompting several hospitalizations.  In between episodes she states she has no symptoms.  Able to eat without any issues.  She denies any abdominal pain to me.  She is on 4 L of oxygen at home.  She denies any heartburn, dysphagia.  Denies NSAIDs, aspirin, blood thinners.  States no further vomiting since admission.  When the spells start she uses Phenergan suppositories at home initially to try to halt the process.  She  continues to have chronic low back pain/hip and leg pain.  She takes OxyContin twice daily, Percocet 5 times daily.  She denies any bowel issues.  Denies melena or rectal bleeding. ? ? ?Colonoscopy April 2022: ? ?Four  6 to 9 mm polyps at the splenic flexure and at the hepatic flexure, removed with a ?cold snare. Resected and retrieved. ?- One 20 mm polyp at the hepatic flexure, removed with a hot snare. Resected and ?retrieved. ?- The examination was otherwise normal on direct and retroflexion views. ?-Multiple tubular adenomas. ?-Next colonoscopy in 3 years ? ?EGD April 2022: ?Normal esophagus. ?- Erosive gastropathy -predominantly antrum. Marked erythema. No ulcer or infiltrating ?process. Patent pylorus. Antrum biopsied ?- Normal duodenal bulb and second portion of the duodenum. ?-Gastric biopsy with mild reactive changes, no H. pylori ? ? ? ?Prior to Admission medications   ?Medication Sig Start Date End Date Taking? Authorizing Provider  ?albuterol (VENTOLIN HFA) 108 (90 Base) MCG/ACT inhaler USE 2 INHALATIONS EVERY 6 HOURS AS NEEDED FOR WHEEZING OR SHORTNESS OF BREATH (NEED TO BE SEEN BEFORE NEXT REFILL) 03/30/21  Yes Ronnie Doss M, DO  ?amLODipine (NORVASC) 10 MG tablet Take 1 tablet (10 mg total) by mouth daily. 03/15/21  Yes Johnson, Clanford L, MD  ?atorvastatin (LIPITOR) 40 MG tablet Take 1 tablet (40 mg total) by mouth daily. 07/09/20  Yes Gottschalk, Leatrice Jewels M, DO  ?budesonide-formoterol (SYMBICORT) 160-4.5 MCG/ACT inhaler USE 2 INHALATIONS TWICE A DAY 01/17/21  Yes Ronnie Doss M, DO  ?Cholecalciferol 125 MCG (5000 UT) capsule Take 5,000 Units by mouth daily.   Yes [provider]  ?dextromethorphan-guaiFENesin (MUCINEX DM) 30-600 MG 12hr tablet Take 1 tablet by mouth 2 (two) times daily.   Yes [provider]  ?DULoxetine (CYMBALTA) 30 MG capsule Take 3 capsules (90 mg total) by mouth daily. 07/09/20  Yes Janora Norlander, DO  ?Iron Polysacch Cmplx-B12-FA (POLY-IRON 150 FORTE) 150-0.025-1 MG CAPS Take 1 capsule by mouth daily. 03/14/21  Yes Johnson, Clanford L, MD  ?L-Methylfolate-Algae-B12-B6 (METANX) 3-90.314-2-35 MG CAPS TAKE 1 TABLET BY MOUTH TWO TIMES A DAY ?Patient taking differently:  Take 1 tablet by mouth daily as needed. 09/10/19  Yes Ronnie Doss M, DO  ?magnesium oxide (MAG-OX) 400 (241.3 Mg) MG tablet Take 400 mg by mouth 2 (two) times daily. 02/07/20  Yes [provider]  ?nicotine (NICODERM CQ) 14 mg/24hr patch Place 1 patch (14 mg total) onto the skin daily. 06/23/20  Yes Janora Norlander, DO  ?Nutritional Supplements (ENSURE HIGH PROTEIN) LIQD Drink 1 can 3 times daily. 08/30/20  Yes Ronnie Doss M, DO  ?omeprazole (PRILOSEC) 40 MG capsule Take one capsule once to twice daily before a meal. 01/18/21  Yes Mahala Menghini, PA-C  ?ondansetron (ZOFRAN ODT) 4 MG disintegrating tablet Take 1 tablet (4 mg total) by mouth every 8 (eight) hours as needed for nausea or vomiting. 09/12/20  Yes Maudie Flakes, MD  ?oxybutynin (DITROPAN-XL) 5 MG 24 hr tablet TAKE 1 TABLET(5 MG) BY MOUTH AT BEDTIME 05/20/21  Yes Hassell Done, Mary-Margaret, FNP  ?oxyCODONE-acetaminophen (PERCOCET) 10-325 MG tablet Take 1 tablet by mouth 5 (five) times daily. 08/27/20  Yes [provider]  ?OXYCONTIN 15 MG 12 hr tablet Take 15 mg by mouth every 12 (twelve) hours. 08/28/20  Yes [provider]  ?OXYGEN Inhale 4 L into the lungs See admin instructions. continuous   Yes [provider]  ?polyethylene glycol (MIRALAX) 17 g packet Take 17 g by mouth daily. 03/14/21  Yes Johnson, Clanford L, MD  ?promethazine (PHENERGAN) 25 MG suppository Place 1 suppository (25 mg total) rectally every 6 (six) hours as needed for nausea or vomiting. 06/07/21  Yes Gottschalk, Ashly M, DO  ?SPIRIVA RESPIMAT 2.5 MCG/ACT AERS USE 2 INHALATIONS DAILY ?Patient taking differently: Inhale 2 puffs into the lungs daily. 07/13/20  Yes Tanda Rockers, MD  ?terbinafine (LAMISIL) 250 MG tablet Take 1 tablet (250 mg total) by mouth daily. 05/19/21  Yes Chevis Pretty, FNP  ?tizanidine (ZANAFLEX) 6 MG capsule Take 6 mg by mouth 3 (three) times daily. 08/27/20  Yes [provider]  ?traZODone (DESYREL) 150 MG  tablet TAKE 1 TO 2 TABLETS AT BEDTIME 03/30/21  Yes Ronnie Doss M, DO  ?        ? ? ?Current Facility-Administered Medications  ?Medication Dose Route Frequency Provider Last Rate Last Admin  ? 0.9 % NaCl wi

## 2021-06-30 NOTE — Assessment & Plan Note (Addendum)
--   resolved now with supportive measures  ?

## 2021-06-30 NOTE — Assessment & Plan Note (Addendum)
--   resume home pain management regimen ?

## 2021-06-30 NOTE — Assessment & Plan Note (Signed)
--   continue skin care protocol  ?

## 2021-06-30 NOTE — Progress Notes (Signed)
Has not vomited today but continues to complain of nausea and has received compazine twice.  Asked for increase in dose of oxycodone from 10 mg to 15 mg which she takes at home.  Gastro added lipase and first does given.   ?

## 2021-06-30 NOTE — Assessment & Plan Note (Addendum)
--   repleted, given additional magnesium ?-- follow up outpatient  ?

## 2021-06-30 NOTE — Assessment & Plan Note (Signed)
--   stable, no active symptoms of acute exacerbation  ?

## 2021-07-01 DIAGNOSIS — G894 Chronic pain syndrome: Secondary | ICD-10-CM | POA: Diagnosis not present

## 2021-07-01 DIAGNOSIS — R112 Nausea with vomiting, unspecified: Secondary | ICD-10-CM | POA: Diagnosis not present

## 2021-07-01 DIAGNOSIS — J9611 Chronic respiratory failure with hypoxia: Secondary | ICD-10-CM | POA: Diagnosis not present

## 2021-07-01 DIAGNOSIS — R109 Unspecified abdominal pain: Secondary | ICD-10-CM | POA: Diagnosis not present

## 2021-07-01 LAB — CBC
HCT: 35.7 % — ABNORMAL LOW (ref 36.0–46.0)
Hemoglobin: 10.6 g/dL — ABNORMAL LOW (ref 12.0–15.0)
MCH: 33.9 pg (ref 26.0–34.0)
MCHC: 29.7 g/dL — ABNORMAL LOW (ref 30.0–36.0)
MCV: 114.1 fL — ABNORMAL HIGH (ref 80.0–100.0)
Platelets: 233 10*3/uL (ref 150–400)
RBC: 3.13 MIL/uL — ABNORMAL LOW (ref 3.87–5.11)
RDW: 13.8 % (ref 11.5–15.5)
WBC: 8.4 10*3/uL (ref 4.0–10.5)
nRBC: 0 % (ref 0.0–0.2)

## 2021-07-01 LAB — BASIC METABOLIC PANEL
Anion gap: 8 (ref 5–15)
BUN: <5 mg/dL — ABNORMAL LOW (ref 8–23)
CO2: 26 mmol/L (ref 22–32)
Calcium: 7.6 mg/dL — ABNORMAL LOW (ref 8.9–10.3)
Chloride: 106 mmol/L (ref 98–111)
Creatinine, Ser: 0.33 mg/dL — ABNORMAL LOW (ref 0.44–1.00)
GFR, Estimated: >60 mL/min (ref >60)
Glucose, Bld: 123 mg/dL — ABNORMAL HIGH (ref 70–99)
Potassium: 3.4 mmol/L — ABNORMAL LOW (ref 3.5–5.1)
Sodium: 140 mmol/L (ref 135–145)

## 2021-07-01 LAB — MAGNESIUM: Magnesium: 2 mg/dL (ref 1.7–2.4)

## 2021-07-01 LAB — LIPASE, BLOOD: Lipase: 17 U/L (ref 11–51)

## 2021-07-01 MED ORDER — POTASSIUM CHLORIDE CRYS ER 20 MEQ PO TBCR
30.0000 meq | EXTENDED_RELEASE_TABLET | Freq: Once | ORAL | Status: AC
  Administered 2021-07-01: 30 meq via ORAL
  Filled 2021-07-01: qty 1

## 2021-07-01 MED ORDER — METOPROLOL TARTRATE 25 MG PO TABS
12.5000 mg | ORAL_TABLET | Freq: Two times a day (BID) | ORAL | Status: DC
  Administered 2021-07-01 – 2021-07-03 (×5): 12.5 mg via ORAL
  Filled 2021-07-01 (×5): qty 1

## 2021-07-01 NOTE — Progress Notes (Signed)
? ?Gastroenterology Progress Note  ? ?Referring Provider: No ref. provider found ?Primary Care Physician:  Janora Norlander, DO ?Primary Gastroenterologist:  Dr. Gala Romney ? ?Patient ID: Jasmine [name]; [id number]; [birth year]  ? ? ?Subjective  ? ?Still having nausea, no vomiting. Pain in chest and back. No abdominal pain. No BM, last BM Tuesday. No issues swallowing. Previous emesis clear. Able to have some sips of tea and water this morning but no significant substance. Had jello and chicken broth yesterday afternoon. Last Saturday was last regular meal (doesn't remember contents). Has had lack of appetite for quite some time now, supplements her diet with boost shakes at home. Denies reflux symptoms. Has been getting her compazine as requested every 6 hours and is controlling her nausea well. Having good urination.  ? ?Objective  ? ?Vital signs in last 24 hours ?Temp:  [98 ?F (36.7 ?C)-98.6 ?F (37 ?C)] 98.6 ?F (37 ?C) (04/28 1249) ?Pulse Rate:  [101-123] 111 (04/28 1249) ?Resp:  [17-19] 17 (04/28 1249) ?BP: (126-162)/(71-101) 129/91 (04/28 1249) ?SpO2:  [100 %] 100 % (04/28 1249) ?Weight:  [35.4 kg] 35.4 kg (04/28 0451) ?Last BM Date : 06/29/21 ? ?Physical Exam ?General:   Alert and oriented, pleasant, cachetic. ?Head:  Normocephalic and atraumatic. ?Eyes:  No icterus, sclera clear. Conjuctiva pink.  ?Mouth:  Without lesions, mucosa pink and moist.  ?Heart:  S1, S2 present, no murmurs noted.  ?Lungs: Clear to auscultation bilaterally, bases diminished, without wheezing, rales, or rhonchi.  ?Abdomen:  Bowel sounds present, soft, tender to epigastrium, non-distended. No HSM or hernias noted. No rebound or guarding. No masses appreciated. ?Msk:  Symmetrical without gross deformities. Normal posture. ?Extremities: Without clubbing or edema. ?Neurologic:  Alert and oriented x4; grossly normal neurologically. ?Psych:  Alert and cooperative. Normal mood and affect. ? ?Intake/Output from previous day: ?04/27 0701 - 04/28  0700 ?In: 2187.2 [P.O.:1160; I.V.:1027.2] ?Out: 1750 [FHLKT:6256] ?Intake/Output this shift: ?Total I/O ?In: 480 [P.O.:480] ?Out: 1000 [Urine:1000] ? ?Lab Results ? ?Recent Labs  ?  06/29/21 ?0559 06/30/21 ?0422 07/01/21 ?3893  ?WBC 6.0 7.6 8.4  ?HGB 10.4* 10.6* 10.6*  ?HCT 34.0* 35.6* 35.7*  ?PLT 259 271 233  ? ?BMET ?Recent Labs  ?  06/29/21 ?1425 06/30/21 ?0422 07/01/21 ?7342  ?NA 144 143 140  ?K 4.1 3.1* 3.4*  ?CL 107 106 106  ?CO2 _0 ?GLUCOSE 113* 125* 123*  ?BUN 9 5* <5*  ?CREATININE 0.70 0.39* 0.33*  ?CALCIUM 7.9* 7.7* 7.6*  ? ?LFT ?Recent Labs  ?  06/28/21 ?2212 06/29/21 ?0559 06/30/21 ?0422  ?PROT 6.3* 5.5* 5.4*  ?ALBUMIN 2.5* 2.2* 2.2*  ?AST _1 ?ALT _2 ?ALKPHOS 149* 124 114  ?BILITOT 1.4* 1.1 0.7  ? ?PT/INR ?No results for input(s): LABPROT, INR in the last 72 hours. ?Hepatitis Panel ?No results for input(s): HEPBSAG, HCVAB, HEPAIGM, HEPBIGM in the last 72 hours. ? ? ?Studies/Results ?CT Chest W Contrast ? ?Result Date: 06/27/2021 ?CLINICAL DATA:  Non-small cell lung cancer restaging, status post right upper lobectomy EXAM: CT CHEST WITH CONTRAST TECHNIQUE: Multidetector CT imaging of the chest was performed during intravenous contrast administration. RADIATION DOSE REDUCTION: This exam was performed according to the departmental dose-optimization program which includes automated exposure control, adjustment of the mA and/or kV according to patient size and/or use of iterative reconstruction technique. CONTRAST:  81m OMNIPAQUE IOHEXOL 300 MG/ML  SOLN COMPARISON:  02/21/2021 FINDINGS: Cardiovascular: Aortic atherosclerosis. Normal heart size. Three-vessel coronary artery calcifications. No pericardial  effusion. Mediastinum/Nodes: No enlarged mediastinal, hilar, or axillary lymph nodes. Thyroid gland, trachea, and esophagus demonstrate no significant findings. Lungs/Pleura: Unchanged postoperative findings of right upper lobectomy. Severe emphysema. Unchanged 0.3 cm nodule of the  anterior left upper lobe (series 4, image 36). No pleural effusion or pneumothorax. Upper Abdomen: No acute abnormality. Hepatic steatosis. Partially imaged intra and extrahepatic biliary ductal dilatation. Musculoskeletal: No chest wall abnormality. No suspicious osseous lesions identified. IMPRESSION: 1. Unchanged postoperative findings of right upper lobectomy. No evidence of recurrent or metastatic disease in the chest. 2. Unchanged 0.3 cm nodule of the anterior left upper lobe, most likely benign and incidental sequelae of infection or inflammation. Attention on follow-up. 3. Severe emphysema. 4. Coronary artery disease. 5. Hepatic steatosis. 6. Partially imaged intra and extrahepatic biliary ductal dilatation, unchanged compared to prior examination. Consider dedicated imaging of the abdomen and pelvis to further evaluate for biliary obstruction. Aortic Atherosclerosis (ICD10-I70.0) and Emphysema (ICD10-J43.9). Electronically Signed   By: Delanna Ahmadi M.D.   On: 06/27/2021 15:18  ? ?US Abdomen Limited RUQ (LIVER/GB) ? ?Result Date: 06/29/2021 ?CLINICAL DATA:  Abdominal pain for 4 days EXAM: ULTRASOUND ABDOMEN LIMITED RIGHT UPPER QUADRANT COMPARISON:  06/27/2021 FINDINGS: Gallbladder: No cholelithiasis. Mild gallbladder wall thickening. Trace pericholecystic fluid. Negative sonographic Murphy sign. Common bile duct: Diameter: 13 mm Liver: No focal lesion identified. Increased hepatic parenchymal echogenicity. Portal vein is patent on color Doppler imaging with normal direction of blood flow towards the liver. Other: Incidental note made of a 11 mm renal cyst. IMPRESSION: No cholelithiasis. Mild gallbladder wall thickening and trace pericholecystic fluid. This appearance can be seen in the setting of hepatocellular disease. Dilated common bile duct measuring 13 mm without an obstructing mass or choledocholithiasis. If there is further clinical concern, recommend an MRCP and MRI of the abdomen. Electronically  Signed   By: Kathreen Devoid M.D.   On: 06/29/2021 10:44   ? ?Assessment  ?66 y.o. female with a history of COPD, pancreatitis in 2020 (unclear etiology as patient only stated occasional EtOH use although presenting with positive EtOH level in January 2022, dilated pancreatic duct/pancreatic insufficiency with abnormal pancreatic elastase in 2022), chronically dilated CBD, h/o C. 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  She presents to the ER with recurrent nausea/vomiting. ? ?Dilated CBD: Patient has history of chronically dilated CBD dating back to at least November 2020.  CBD 14 mm at that time.  She presented with suspected pancreatitis with unclear etiology.  She denies heavy or daily alcohol use although she had a positive alcohol level in January 2022.  Prior imaging has also showed pancreatic ductal dilation up to 8 mm in the pancreatic head.  Most recent imaging January 2023 with calcifications in the pancreatic head and uncinate process, 1 calcification in the region of the ampulla.  It was suggested that she have EUS back in 2020 and 2022 for which patient has not completed due to various medical issues along the way.  She has documented pancreatic elastase less than 50 implying pancreatic exocrine insufficiency, denies any diarrhea but this could be contributing to her weight loss.  Gallbladder remains.  CBD dilation could also be due to chronic opioid use.  RUQ ultrasound with no evidence of cholelithiasis however there is mild gallbladder wall thickening and trace pericholecystic fluid. She cannot have MRI due to her spinal cord stimulator.  LFTs have remained normal, no LFTs  to review today.  Alk phos elevated 149 on admission but improved the next day. Continue supportive measures, continue to recommend EUS outpatient.  ? ?Nausea/vomiting: Likely multifactorial. Cannot exclude  biliary etiology, acute on chronic pancreatitis, gastroparesis in setting of opioid use.  Patient will need EUS in the future for further evaluation.  Last upper endoscopy April 2022 with normal esophagus, erosive ga

## 2021-07-01 NOTE — TOC Initial Note (Signed)
Transition of Care (TOC) - Initial/Assessment Note  ? ? ?Patient Details  ?Name: Jasmine [name] ?MRN: [id number] ?Date of Birth: [birth year] ? ?Transition of Care (TOC) CM/SW Contact:    ?Boneta Lucks, RN ?Phone Number: ?07/01/2021, 1:48 PM ? ?Clinical Narrative:      Patient admitted with Abdominal pain and vomiting. Patient has a high risk of readmission. TOC spoke with patient.  No changes since last assessment, she has equipment and her husband provides care and transportation as needed. She does not wish to have home health.          ? ? ?Expected Discharge Plan: Home/Self Care ?Barriers to Discharge: Continued Medical Work up ? ? ?Patient Goals and CMS Choice ?Patient states their goals for this hospitalization and ongoing recovery are:: to go home. ?CMS Medicare.gov Compare Post Acute Care list provided to:: Patient ?Choice offered to / list presented to : Patient ? ?Expected Discharge Plan and Services ?Expected Discharge Plan: Home/Self Care ?  ?  ?  ?Living arrangements for the past 2 months: [place] ?                ?  ?Prior Living Arrangements/Services ?Living arrangements for the past 2 months: [place] ?Lives with:: Spouse ?Patient language and need for interpreter reviewed:: Yes ?Do you feel safe going back to the place where you live?: Yes      ?Need for Family Participation in Patient Care: Yes (Comment) ?Care giver support system in place?: Yes (comment) ?Current home services: DME ?Criminal Activity/Legal Involvement Pertinent to Current Situation/Hospitalization: No - Comment as needed ? ?Activities of Daily Living ?Home Assistive Devices/Equipment: Bedside commode/3-in-1, Shower chair without back, Walker (specify type), Wheelchair ?ADL Screening (condition at time of admission) ?Patient's cognitive ability adequate to safely complete daily activities?: Yes ?Is the patient deaf or have difficulty hearing?: No ?Does the patient have difficulty seeing, even when wearing  glasses/contacts?: No ?Does the patient have difficulty concentrating, remembering, or making decisions?: No ?Patient able to express need for assistance with ADLs?: Yes ?Does the patient have difficulty dressing or bathing?: No ?Independently performs ADLs?: No ?Communication: Independent ?Dressing (OT): Needs assistance ?Is this a change from baseline?: Pre-admission baseline ?Grooming: Needs assistance ?Is this a change from baseline?: Pre-admission baseline ?Feeding: Independent ?Bathing: Needs assistance ?Is this a change from baseline?: Pre-admission baseline ?Toileting: Needs assistance ?In/Out Bed: Needs assistance ?Is this a change from baseline?: Pre-admission baseline ?Walks in Home: Independent with device (comment) ?Does the patient have difficulty walking or climbing stairs?: Yes ?Weakness of Legs: Both ?Weakness of Arms/Hands: None ? ? ?Emotional Assessment ?  ?Attitude/Demeanor/Rapport: Aggressive (Verbally and/or physically) ?Affect (typically observed): Accepting ?Orientation: : Oriented to Self, Oriented to Place, Oriented to  Time, Oriented to Situation ?Alcohol / Substance Use: Not Applicable ?Psych Involvement: No (comment) ? ?Admission diagnosis:  Intractable nausea and vomiting [R11.2] ?Abdominal pain with vomiting [R10.9, R11.10] ?Patient Active Problem List  ? Diagnosis Date Noted  ? Abdominal pain with vomiting 06/29/2021  ? Chronic pain syndrome 03/13/2021  ? Pressure injury of skin 03/12/2021  ? Acute on chronic pancreatitis ([place]) 03/12/2021  ? Intractable abdominal pain 03/11/2021  ? Intractable pain 02/12/2021  ? Squamous cell lung cancer, right ([place]) 01/13/2021  ? Atypical chest pain 11/26/2020  ? Elevated MCV 11/26/2020  ? Intractable nausea and vomiting 11/26/2020  ? History of Clostridioides difficile infection 11/26/2020  ? 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[place]) 10/11/2020  ? Gastroenteritis 10/11/2020  ? Acute gastroenteritis 10/10/2020  ? Protein-calorie  malnutrition, severe 08/26/2020  ?  Chest pain 08/25/2020  ? Pleuritic chest pain 08/25/2020  ? 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  ? GERD (gastroesophageal reflux disease) 04/22/2020  ? Abnormal CT of the abdomen 04/22/2020  ? Preventative health care 04/22/2020  ? Solitary pulmonary nodule 03/22/2020  ? Pulmonary infiltrate on chest x-ray 02/24/2020  ? Nocturnal cough 02/19/2020  ? Pancreatitis, acute 01/06/2019  ? Acute pancreatitis 01/05/2019  ? Cigarette smoker 08/25/2017  ? Chronic respiratory failure with hypoxia ([place]) 08/25/2017  ? Centrilobular emphysema ([place]) 07/09/2017  ? Aortic atherosclerosis ([place]) 07/09/2017  ? Hepatic steatosis 07/09/2017  ? Malnutrition of moderate degree 07/02/2017  ? Acute respiratory failure with hypoxia ([place]) 06/30/2017  ? Hypokalemia 06/30/2017  ? COPD with acute exacerbation ([place]) 06/30/2017  ? 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/2016  ? Tobacco abuse 08/04/2016  ? Iron deficiency anemia 07/11/2016  ? Easy bruising 07/10/2016  ? Lumbar stenosis 11/11/2015  ? Leg pain 09/24/2015  ? Vitamin D deficiency 07/29/2015  ? Chronic back pain 02/22/2015  ? HTN (hypertension) 02/22/2015  ? Chronic narcotic use 02/22/2015  ? COPD GOLD II  02/22/2015  ? Headache 02/22/2015  ? ?PCP:  Janora Norlander, DO ?Pharmacy:   ?[facility], [place] ?[place] ?[place] [address] ?Phone: [phone] Fax: [phone] ? ?[place], [place] ?[address] ?[place] [address] ?Phone: [phone] Fax: [phone] ? ? ? ?Readmission Risk Interventions ? ?  07/01/2021  ?  1:45 PM 02/14/2021  ? 10:44 AM 10/13/2020  ? 11:14 AM  ?Readmission Risk Prevention Plan  ?Transportation Screening Complete Complete Complete  ?Medication Review Press photographer)  Complete Complete  ?PCP or Specialist appointment within 3-5 days of discharge Not Complete  Complete  ?[place] or Home Care Consult Complete Patient  refused Complete  ?SW Recovery Care/Counseling Consult Complete Complete Complete  ?Palliative Care Screening Not Applicable Not Applicable Not Applicable  ?Comfort Not Applicable Not Applicable Not Applicable  ? ? ? ?

## 2021-07-01 NOTE — Progress Notes (Addendum)
?PROGRESS NOTE ? ? ?Jasmine [name]  QAS:[id number] DOB: 06-Mar-1956 DOA: 06/28/2021 ?PCP: Janora Norlander, DO  ? ?Chief Complaint  ?Patient presents with  ? Emesis  ? ?Level of care: Med-Surg ? ?Brief Admission History:  ?66 y.o. female with medical history significant for COPD, chronic hypoxic respiratory failure, lung cancer status post right upper lobectomy, protein calorie malnutrition, and chronic pain, now presenting to the emergency department with epigastric pain, nausea, and vomiting.  She suffers from chronic nausea and pain but symptoms have been worse since 06/26/2021.  She denies fevers.  She had a loose stool yesterday.  She reports a history of pancreatitis but states that this is different.  No change in chronic cough or dyspnea. ?  ?ED Course: Upon arrival to the ED, patient is found to be afebrile, saturating well on her usual supplemental oxygen, slightly tachycardic, and with stable blood pressure.  Chemistry panel notable for potassium 2.9, anion gap 18, alkaline phosphatase 149, albumin 2.5, and total bilirubin 1.4.  Patient was given a liter of IV fluids, 10 mill equivalents IV potassium, morphine, Toradol, and Zofran in the ED. ?  ?Assessment and Plan: ?* Abdominal pain with vomiting ?-- given persistent CBD dilatation will ask for GI opinion ?-- unable to have MRI due to stimulator implanted  ?-- GI reports patient has had dilated CBD for many years  ?-- GI started patient on supplemental pancreatic enzymes ?-- Pt still unable to eat due to severe nausea  ? ?Chronic pain syndrome ?-- pain difficult to control, discussed with patient we may not be able to fully control all of her pain ? ?Pressure injury of skin ?-- continue skin care protocol  ? ?Intractable nausea and vomiting ?-- slowly improving with supportive measures  ? ?Protein-calorie malnutrition, severe ?--appreciate dietitian consultation and recommendations ? ?Chronic respiratory failure with hypoxia (HCC) ?-- currently stable on  home oxygen requirement  ? ?Hypokalemia ?-- repleted, give additional magnesium today, recheck in AM  ? ?COPD GOLD II  ?-- stable, no active symptoms of acute exacerbation  ? ?HTN (hypertension) ?-- well managed, following ? ?DVT prophylaxis:  ?Code Status: full  ?Family Communication:  ?Disposition: Status is: Inpatient ?Remains inpatient appropriate because: IV fluids, uncontrolled pain, subspecialty consultation  ?  ?Consultants:  ?GI service  ?Procedures:  ? ?Antimicrobials:  ?  ?Subjective: ?Pt complains that she is not able to eat due to severe nausea   ?Objective: ?Vitals:  ? 07/01/21 0451 07/01/21 0800 07/01/21 0936 07/01/21 1249  ?BP:   (!) 152/99 (!) 129/91  ?Pulse:   (!) 123 (!) 111  ?Resp:   19 17  ?Temp:    98.6 ?F (37 ?C)  ?TempSrc:    Oral  ?SpO2:  100% 100% 100%  ?Weight: 35.4 kg     ?Height:      ? ? ?Intake/Output Summary (Last 24 hours) at 07/01/2021 1304 ?Last data filed at 07/01/2021 0900 ?Gross per 24 hour  ?Intake 2367.21 ml  ?Output 2000 ml  ?Net 367.21 ml  ? ?Filed Weights  ? 06/28/21 2200 06/30/21 0436 07/01/21 0451  ?Weight: 36.7 kg 37.3 kg 35.4 kg  ? ?Examination: ? ?General exam: Thin emaciated appearing chronically ill appearing female. Curled up in bed, NAD.  ?Respiratory system: Clear to auscultation. Respiratory effort normal. ?Cardiovascular system: normal S1 & S2 heard. No JVD, murmurs, rubs, gallops or clicks. No pedal edema. ?Gastrointestinal system: Abdomen is nondistended, soft and very mild epigastric tenderness. No organomegaly or masses felt. Normal bowel sounds  heard. ?Central nervous system: Alert and oriented. No focal neurological deficits. ?Extremities: Symmetric 5 x 5 power. ?Skin: no gross lesions seen.  ?Psychiatry: Judgement and insight appear normal. Mood & affect appropriate.  ? ?Data Reviewed: I have personally reviewed following labs and imaging studies ? ?CBC: ?Recent Labs  ?Lab 06/27/21 ?1232 06/28/21 ?2212 06/29/21 ?0559 06/30/21 ?0422 07/01/21 ?5409  ?WBC 7.8  6.2 6.0 7.6 8.4  ?NEUTROABS 5.6 5.4  --   --   --   ?HGB 10.0* 11.9* 10.4* 10.6* 10.6*  ?HCT 32.7* 38.0 34.0* 35.6* 35.7*  ?MCV 112.8* 111.8* 115.3* 112.7* 114.1*  ?PLT 261 329 259 271 233  ? ? ?Basic Metabolic Panel: ?Recent Labs  ?Lab 06/28/21 ?2212 06/29/21 ?0559 06/29/21 ?1425 06/30/21 ?0422 07/01/21 ?8119  ?NA 141 144 144 143 140  ?K 2.9* 3.6 4.1 3.1* 3.4*  ?CL 99 105 107 106 106  ?CO2 24 23 25 30 26   ?GLUCOSE 143* 98 113* 125* 123*  ?BUN 7* 9 9 5* <5*  ?CREATININE 0.57 0.70 0.70 0.39* 0.33*  ?CALCIUM 8.5* 7.9* 7.9* 7.7* 7.6*  ?MG  --  1.5*  --  1.8 2.0  ? ? ?CBG: ?No results for input(s): GLUCAP in the last 168 hours. ? ?No results found for this or any previous visit (from the past 240 hour(s)).  ? ?Radiology Studies: ?No results found. ? ?Scheduled Meds: ? amLODipine  10 mg Oral Daily  ? atorvastatin  40 mg Oral QPM  ? cholecalciferol  5,000 Units Oral Daily  ? DULoxetine  90 mg Oral Daily  ? enoxaparin (LOVENOX) injection  30 mg Subcutaneous Q24H  ? feeding supplement  1 Container Oral TID BM  ? guaiFENesin  600 mg Oral BID  ? iron polysaccharides  150 mg Oral Daily  ? lipase/protease/amylase  24,000 Units Oral With snacks  ? lipase/protease/amylase  48,000 Units Oral TID WC  ? mometasone-formoterol  2 puff Inhalation BID  ? multivitamin with minerals  1 tablet Oral Daily  ? oxybutynin  5 mg Oral QHS  ? oxyCODONE  15 mg Oral Q12H  ? pantoprazole  40 mg Oral Daily  ? polyethylene glycol  17 g Oral Daily  ? Ensure Max Protein  1 Container Oral TID  ? umeclidinium bromide  1 puff Inhalation Daily  ? ?Continuous Infusions: ? 0.9 % NaCl with KCl 40 mEq / L 75 mL/hr at 07/01/21 0010  ? ? ? LOS: 1 day  ? ?Time spent: 34 mins ? ?Irwin Brakeman, MD ?How to contact the [facility] Attending or Consulting provider [place] or covering provider during after hours [place], for this patient?  ?Check the care team in [facility] and look for a) attending/consulting [facility] provider listed and b) the [facility] team listed ?Log into [email or web address]  and use [facility]'s universal password to access. If you do not have the password, please contact the hospital operator. ?Locate the [facility] provider you are looking for under [facility] and page to a number that you can be directly reached. ?If you still have difficulty reaching the provider, please page the [facility] (Director on Call) for the Hospitalists listed on amion for assistance. ? ?07/01/2021, 1:04 PM  ? ? ?

## 2021-07-01 NOTE — Assessment & Plan Note (Signed)
--   currently stable on home oxygen requirement  ?

## 2021-07-01 NOTE — Care Management Important Message (Signed)
Important Message ? ?Patient Details  ?Name: Jasmine [name] ?MRN: [id number] ?Date of Birth: May 13, 1955 ? ? ?Medicare Important Message Given:  Yes ? ? ? ? ?[name] ?07/01/2021, 1:26 PM ?

## 2021-07-02 DIAGNOSIS — L89309 Pressure ulcer of unspecified buttock, unspecified stage: Secondary | ICD-10-CM

## 2021-07-02 DIAGNOSIS — R112 Nausea with vomiting, unspecified: Secondary | ICD-10-CM | POA: Diagnosis not present

## 2021-07-02 DIAGNOSIS — G894 Chronic pain syndrome: Secondary | ICD-10-CM | POA: Diagnosis not present

## 2021-07-02 DIAGNOSIS — J9611 Chronic respiratory failure with hypoxia: Secondary | ICD-10-CM | POA: Diagnosis not present

## 2021-07-02 DIAGNOSIS — R109 Unspecified abdominal pain: Secondary | ICD-10-CM | POA: Diagnosis not present

## 2021-07-02 DIAGNOSIS — R1110 Vomiting, unspecified: Secondary | ICD-10-CM | POA: Diagnosis not present

## 2021-07-02 MED ORDER — POLYSACCHARIDE IRON COMPLEX 150 MG PO CAPS
150.0000 mg | ORAL_CAPSULE | Freq: Every day | ORAL | Status: DC
  Administered 2021-07-03: 150 mg via ORAL
  Filled 2021-07-02: qty 1

## 2021-07-02 MED ORDER — MORPHINE SULFATE (PF) 2 MG/ML IV SOLN
1.0000 mg | INTRAVENOUS | Status: DC | PRN
  Administered 2021-07-02 – 2021-07-03 (×5): 1 mg via INTRAVENOUS
  Filled 2021-07-02 (×5): qty 1

## 2021-07-02 NOTE — Progress Notes (Signed)
?   07/02/21 0850  ?Assess: MEWS Score  ?BP (!) 156/96  ?Pulse Rate (!) 113  ?SpO2 100 %  ?O2 Device Nasal Cannula  ?O2 Flow Rate (L/min) 4 L/min  ?Assess: MEWS Score  ?MEWS Temp 0  ?MEWS Systolic 0  ?MEWS Pulse 2  ?MEWS RR 0  ?MEWS LOC 0  ?MEWS Score 2  ?MEWS Score Color Yellow  ?Assess: if the MEWS score is Yellow or Red  ?Were vital signs taken at a resting state? Yes  ?Focused Assessment No change from prior assessment  ?Early Detection of Sepsis Score *See Row Information* Low  ?MEWS guidelines implemented *See Row Information*  ?(q2hour vitals)  ?Treat  ?Pain Scale 0-10  ?Pain Score 0  ?Pain Type Acute pain  ?Pain Location Back  ?Pain Orientation Upper  ?Pain Onset On-going  ?Pain Intervention(s) Medication (See eMAR)  ?Take Vital Signs  ?Increase Vital Sign Frequency  Yellow: Q 2hr X 2 then Q 4hr X 2, if remains yellow, continue Q 4hrs  ?Escalate  ?MEWS: Escalate Yellow: discuss with charge nurse/RN and consider discussing with provider and RRT  ?Notify: Charge Nurse/RN  ?Name of Charge Nurse/RN Notified Audrea Muscat RN  ?Date Charge Nurse/RN Notified 07/02/21  ?Time Charge Nurse/RN Notified [phone]  ?Notify: Provider  ?Provider Name/Title Dr Wynetta Emery  ?Date Provider Notified 07/02/21  ?Time Provider Notified [phone]  ?Notification Type Page ?(secure chat yellow mews)  ?Notification Reason Other (Comment) ?(yellow mews)  ?Date of Provider Response 07/02/21  ? ? ?

## 2021-07-02 NOTE — Progress Notes (Signed)
?PROGRESS NOTE ? ? ?Jasmine [name]  WPY:[id number] DOB: 02/04/1956 DOA: 06/28/2021 ?PCP: Janora Norlander, DO  ? ?Chief Complaint  ?Patient presents with  ? Emesis  ? ?Level of care: Med-Surg ? ?Brief Admission History:  ?67 y.o. female with medical history significant for COPD, chronic hypoxic respiratory failure, lung cancer status post right upper lobectomy, protein calorie malnutrition, and chronic pain, now presenting to the emergency department with epigastric pain, nausea, and vomiting.  She suffers from chronic nausea and pain but symptoms have been worse since 06/26/2021.  She denies fevers.  She had a loose stool yesterday.  She reports a history of pancreatitis but states that this is different.  No change in chronic cough or dyspnea. ?  ?ED Course: Upon arrival to the ED, patient is found to be afebrile, saturating well on her usual supplemental oxygen, slightly tachycardic, and with stable blood pressure.  Chemistry panel notable for potassium 2.9, anion gap 18, alkaline phosphatase 149, albumin 2.5, and total bilirubin 1.4.  Patient was given a liter of IV fluids, 10 mill equivalents IV potassium, morphine, Toradol, and Zofran in the ED. ?  ?Assessment and Plan: ?* Abdominal pain with vomiting ?-- given persistent CBD dilatation will ask for GI opinion ?-- unable to have MRI due to stimulator implanted  ?-- GI reports patient has had dilated CBD for many years  ?-- GI started patient on supplemental pancreatic enzymes ?-- Pt tolerating clears, wants to advance diet.  Heart healthy ordered.   ? ?Chronic pain syndrome ?-- pain difficult to control, discussed with patient we may not be able to fully control all of her pain ? ?Pressure injury of skin ?-- continue skin care protocol  ? ?Intractable nausea and vomiting ?-- slowly improving with supportive measures  ? ?Protein-calorie malnutrition, severe ?--appreciate dietitian consultation and recommendations ? ?Chronic respiratory failure with hypoxia  (HCC) ?-- currently stable on home oxygen requirement  ? ?Hypokalemia ?-- repleted, give additional magnesium today, recheck in AM  ? ?COPD GOLD II  ?-- stable, no active symptoms of acute exacerbation  ? ?HTN (hypertension) ?-- well managed, following ? ?DVT prophylaxis:  ?Code Status: full  ?Family Communication:  ?Disposition: Status is: Inpatient ?Remains inpatient appropriate because: IV fluids, uncontrolled pain, subspecialty consultation  ?  ?Consultants:  ?GI service  ?Procedures:  ? ?Antimicrobials:  ?  ?Subjective: ?Pt wants to advance her diet today.    ?Objective: ?Vitals:  ? 07/02/21 0500 07/02/21 0803 07/02/21 0850 07/02/21 1259  ?BP:   (!) 156/96 104/77  ?Pulse:   (!) 113 85  ?Resp:    16  ?Temp:    98.1 ?F (36.7 ?C)  ?TempSrc:    Oral  ?SpO2:  100% 100% 100%  ?Weight: 34.1 kg     ?Height:      ? ? ?Intake/Output Summary (Last 24 hours) at 07/02/2021 1558 ?Last data filed at 07/02/2021 1259 ?Gross per 24 hour  ?Intake 480 ml  ?Output 1200 ml  ?Net -720 ml  ? ?Filed Weights  ? 06/30/21 0436 07/01/21 0451 07/02/21 0500  ?Weight: 37.3 kg 35.4 kg 34.1 kg  ? ?Examination: ? ?General exam: Thin emaciated appearing chronically ill appearing female. Curled up in bed, NAD.  ?Respiratory system: Clear to auscultation. Respiratory effort normal. ?Cardiovascular system: normal S1 & S2 heard. No JVD, murmurs, rubs, gallops or clicks. No pedal edema. ?Gastrointestinal system: Abdomen is nondistended, soft and very mild epigastric tenderness. No organomegaly or masses felt. Normal bowel sounds heard. ?Central nervous system:  Alert and oriented. No focal neurological deficits. ?Extremities: Symmetric 5 x 5 power. ?Skin: no gross lesions seen.  ?Psychiatry: Judgement and insight appear normal. Mood & affect appropriate.  ? ?Pressure Injury 06/29/21 Coccyx Bilateral Stage 1 -  Intact skin with non-blanchable redness of a localized area usually over a bony prominence. non-blanchable area to coccyx with small scab noted  (Active)  ?06/29/21 0837  ?Location: Coccyx  ?Location Orientation: Bilateral  ?Staging: Stage 1 -  Intact skin with non-blanchable redness of a localized area usually over a bony prominence.  ?Wound Description (Comments): non-blanchable area to coccyx with small scab noted  ?Present on Admission: Yes  ? ?Data Reviewed: I have personally reviewed following labs and imaging studies ? ?CBC: ?Recent Labs  ?Lab 06/27/21 ?1232 06/28/21 ?2212 06/29/21 ?0559 06/30/21 ?0422 07/01/21 ?1540  ?WBC 7.8 6.2 6.0 7.6 8.4  ?NEUTROABS 5.6 5.4  --   --   --   ?HGB 10.0* 11.9* 10.4* 10.6* 10.6*  ?HCT 32.7* 38.0 34.0* 35.6* 35.7*  ?MCV 112.8* 111.8* 115.3* 112.7* 114.1*  ?PLT 261 329 259 271 233  ? ? ?Basic Metabolic Panel: ?Recent Labs  ?Lab 06/28/21 ?2212 06/29/21 ?0559 06/29/21 ?1425 06/30/21 ?0422 07/01/21 ?0867  ?NA 141 144 144 143 140  ?K 2.9* 3.6 4.1 3.1* 3.4*  ?CL 99 105 107 106 106  ?CO2 24 23 25 30 26   ?GLUCOSE 143* 98 113* 125* 123*  ?BUN 7* 9 9 5* <5*  ?CREATININE 0.57 0.70 0.70 0.39* 0.33*  ?CALCIUM 8.5* 7.9* 7.9* 7.7* 7.6*  ?MG  --  1.5*  --  1.8 2.0  ? ? ?CBG: ?No results for input(s): GLUCAP in the last 168 hours. ? ?No results found for this or any previous visit (from the past 240 hour(s)).  ? ?Radiology Studies: ?No results found. ? ?Scheduled Meds: ? amLODipine  10 mg Oral Daily  ? atorvastatin  40 mg Oral QPM  ? cholecalciferol  5,000 Units Oral Daily  ? DULoxetine  90 mg Oral Daily  ? enoxaparin (LOVENOX) injection  30 mg Subcutaneous Q24H  ? feeding supplement  1 Container Oral TID BM  ? guaiFENesin  600 mg Oral BID  ? [START ON 07/03/2021] iron polysaccharides  150 mg Oral Q breakfast  ? lipase/protease/amylase  24,000 Units Oral With snacks  ? lipase/protease/amylase  48,000 Units Oral TID WC  ? metoprolol tartrate  12.5 mg Oral BID  ? mometasone-formoterol  2 puff Inhalation BID  ? multivitamin with minerals  1 tablet Oral Daily  ? oxybutynin  5 mg Oral QHS  ? oxyCODONE  15 mg Oral Q12H  ? pantoprazole  40 mg  Oral Daily  ? polyethylene glycol  17 g Oral Daily  ? Ensure Max Protein  1 Container Oral TID  ? umeclidinium bromide  1 puff Inhalation Daily  ? ?Continuous Infusions: ? 0.9 % NaCl with KCl 40 mEq / L 35 mL/hr at 07/02/21 1554  ? ? ? LOS: 2 days  ? ?Time spent: 36 mins ? ?Irwin Brakeman, MD ?How to contact the [facility] Attending or Consulting provider [place] or covering provider during after hours [place], for this patient?  ?Check the care team in [facility] and look for a) attending/consulting [facility] provider listed and b) the [facility] team listed ?Log into [email or web address] and use 's universal password to access. If you do not have the password, please contact the hospital operator. ?Locate the [facility] provider you are looking for under [facility] and page  to a number that you can be directly reached. ?If you still have difficulty reaching the provider, please page the [facility] (Director on Call) for the Hospitalists listed on amion for assistance. ? ?07/02/2021, 3:58 PM  ? ? ?

## 2021-07-02 NOTE — Progress Notes (Signed)
Patient feeling much better this afternoon.  Tolerating clear liquids-wants more to eat.  Denies abdominal pain or nausea.  No BM in the past 24 hours ? ?Vital signs in last 24 hours: ?Temp:  [98.1 ?F (36.7 ?C)-98.6 ?F (37 ?C)] 98.1 ?F (36.7 ?C) (04/29 1259) ?Pulse Rate:  [85-113] 85 (04/29 1259) ?Resp:  [16-18] 16 (04/29 1259) ?BP: (104-156)/(77-96) 104/77 (04/29 1259) ?SpO2:  [100 %] 100 % (04/29 1259) ?Weight:  [34.1 kg] 34.1 kg (04/29 0500) ?Last BM Date : 06/07/21 ?General:   Alert,  pleasant and cooperative in NAD ?Abdomen: Nondistended.  Positive bowel sounds soft and nontender  ?extremities:  Without clubbing or edema.   ? ?Intake/Output from previous day: ?04/28 0701 - 04/29 0700 ?In: 720 [P.O.:720] ?Out: 2200 [Urine:2200] ?Intake/Output this shift: ?Total I/O ?In: 480 [P.O.:480] ?Out: -  ? ?Lab Results: ?Recent Labs  ?  06/30/21 ?0422 07/01/21 ?3716  ?WBC 7.6 8.4  ?HGB 10.6* 10.6*  ?HCT 35.6* 35.7*  ?PLT 271 233  ? ?BMET ?Recent Labs  ?  06/29/21 ?1425 06/30/21 ?0422 07/01/21 ?9678  ?NA 144 143 140  ?K 4.1 3.1* 3.4*  ?CL 107 106 106  ?CO2 25 30 26   ?GLUCOSE 113* 125* 123*  ?BUN 9 5* <5*  ?CREATININE 0.70 0.39* 0.33*  ?CALCIUM 7.9* 7.7* 7.6*  ? ?LFT ?Recent Labs  ?  06/30/21 ?0422  ?PROT 5.4*  ?ALBUMIN 2.2*  ?AST 23  ?ALT 16  ?ALKPHOS 114  ?BILITOT 0.7  ? ?Impression: 66 year old lady with chronic pancreatitis/pancreatic exocrine deficiency, chronic opioid therapy admitted with nausea,vomiting and abdominal pain. ?Symptoms dramatically improved with pancreatic enzyme supplementation. ?Chronically dilated bile duct may be secondary to opioids versus sphincter of Oddi versus other process.  LFTs remain normal.  EUS needed to sort out. ? ?Recommendations: ? ?Advance to low-fat diet. ? ?Continue pancreatic enzymes/PPI ? ?As needed antiemetic therapy. ? ?If she continues to do well, may be able to discharge within the next 24 hours ? ?My office will arrange an endoscopic ultrasound as recommended. ? ?

## 2021-07-03 DIAGNOSIS — G894 Chronic pain syndrome: Secondary | ICD-10-CM | POA: Diagnosis not present

## 2021-07-03 DIAGNOSIS — R109 Unspecified abdominal pain: Secondary | ICD-10-CM | POA: Diagnosis not present

## 2021-07-03 DIAGNOSIS — Z720 Tobacco use: Secondary | ICD-10-CM

## 2021-07-03 DIAGNOSIS — J9611 Chronic respiratory failure with hypoxia: Secondary | ICD-10-CM | POA: Diagnosis not present

## 2021-07-03 DIAGNOSIS — I10 Essential (primary) hypertension: Secondary | ICD-10-CM | POA: Diagnosis not present

## 2021-07-03 MED ORDER — TIZANIDINE HCL 6 MG PO CAPS: 6.0000 mg | ORAL_CAPSULE | Freq: Three times a day (TID) | ORAL | Status: DC | PRN

## 2021-07-03 MED ORDER — POTASSIUM CHLORIDE CRYS ER 20 MEQ PO TBCR
20.0000 meq | EXTENDED_RELEASE_TABLET | Freq: Once | ORAL | Status: AC
  Administered 2021-07-03: 20 meq via ORAL
  Filled 2021-07-03: qty 1

## 2021-07-03 MED ORDER — METOPROLOL TARTRATE 25 MG PO TABS: 12.5000 mg | ORAL_TABLET | Freq: Two times a day (BID) | ORAL | 1 refills | Status: DC

## 2021-07-03 MED ORDER — METANX 3-90.314-2-35 MG PO CAPS
1.0000 | ORAL_CAPSULE | Freq: Every day | ORAL | Status: DC | PRN
Start: 2021-07-03 — End: 2021-08-03

## 2021-07-03 MED ORDER — PANCRELIPASE (LIP-PROT-AMYL) 24000-76000 UNITS PO CPEP: ORAL_CAPSULE | ORAL | 2 refills | Status: DC

## 2021-07-03 NOTE — Discharge Instructions (Signed)
IMPORTANT INFORMATION: PAY CLOSE ATTENTION  ° °PHYSICIAN DISCHARGE INSTRUCTIONS ° °Follow with Primary care provider  Gottschalk, Ashly M, DO  and other consultants as instructed by your Hospitalist Physician ° °SEEK MEDICAL CARE OR RETURN TO EMERGENCY ROOM IF SYMPTOMS COME BACK, WORSEN OR NEW PROBLEM DEVELOPS  ° °Please note: °You were cared for by a hospitalist during your hospital stay. Every effort will be made to forward records to your primary care provider.  You can request that your primary care provider send for your hospital records if they have not received them.  Once you are discharged, your primary care physician will handle any further medical issues. Please note that NO REFILLS for any discharge medications will be authorized once you are discharged, as it is imperative that you return to your primary care physician (or establish a relationship with a primary care physician if you do not have one) for your post hospital discharge needs so that they can reassess your need for medications and monitor your lab values. ° °Please get a complete blood count and chemistry panel checked by your Primary MD at your next visit, and again as instructed by your Primary MD. ° °Get Medicines reviewed and adjusted: °Please take all your medications with you for your next visit with your Primary MD ° °Laboratory/radiological data: °Please request your Primary MD to go over all hospital tests and procedure/radiological results at the follow up, please ask your primary care provider to get all Hospital records sent to his/her office. ° °In some cases, they will be blood work, cultures and biopsy results pending at the time of your discharge. Please request that your primary care provider follow up on these results. ° °If you are diabetic, please bring your blood sugar readings with you to your follow up appointment with primary care.   ° °Please call and make your follow up appointments as soon as possible.   ° °Also  Note the following: °If you experience worsening of your admission symptoms, develop shortness of breath, life threatening emergency, suicidal or homicidal thoughts you must seek medical attention immediately by calling 911 or calling your MD immediately  if symptoms less severe. ° °You must read complete instructions/literature along with all the possible adverse reactions/side effects for all the Medicines you take and that have been prescribed to you. Take any new Medicines after you have completely understood and accpet all the possible adverse reactions/side effects.  ° °Do not drive when taking Pain medications or sleeping medications (Benzodiazepines) ° °Do not take more than prescribed Pain, Sleep and Anxiety Medications. It is not advisable to combine anxiety,sleep and pain medications without talking with your primary care practitioner ° °Special Instructions: If you have smoked or chewed Tobacco  in the last 2 yrs please stop smoking, stop any regular Alcohol  and or any Recreational drug use. ° °Wear Seat belts while driving.  Do not drive if taking any narcotic, mind altering or controlled substances or recreational drugs or alcohol.  ° ° ° ° ° °

## 2021-07-03 NOTE — Progress Notes (Signed)
Discharge instructions given patient verblaized understanding. Awaiting ride to arrive for pick up. IV removed.  ?

## 2021-07-03 NOTE — Assessment & Plan Note (Signed)
--   Pt strongly advised to stop all tobacco usage  ?

## 2021-07-03 NOTE — Discharge Summary (Signed)
Physician Discharge Summary  ?Jasmine [name] DXA:[id number] DOB: Mar 04, 1956 DOA: 06/28/2021 ? ?PCP: Janora Norlander, DO ?[facility]  ? ?Admit date: 06/28/2021 ?Discharge date: 07/03/2021 ? ?Admitted From:  HOME ?Disposition: HOME (Ong) ? ?Recommendations for Outpatient Follow-up:  ?Follow up with PCP in 1 weeks ?Follow up with GI for arrangement of EUS  ?Please obtain BMP/CBC in 1-2 weeks to follow up ? ?Home Health:  DECLINED  ? ?Discharge Condition: STABLE   ?CODE STATUS: FULL   ?Diet: heart/soft ? ?Brief Hospitalization Summary: ?Please see all hospital notes, images, labs for full details of the hospitalization. ?66 y.o. female with medical history significant for COPD, chronic hypoxic respiratory failure, lung cancer status post right upper lobectomy, protein calorie malnutrition, and chronic pain, now presenting to the emergency department with epigastric pain, nausea, and vomiting.  She suffers from chronic nausea and pain but symptoms have been worse since 06/26/2021.  She denies fevers.  She had a loose stool yesterday.  She reports a history of pancreatitis but states that this is different.  No change in chronic cough or dyspnea. ?  ?ED Course: Upon arrival to the ED, patient is found to be afebrile, saturating well on her usual supplemental oxygen, slightly tachycardic, and with stable blood pressure.  Chemistry panel notable for potassium 2.9, anion gap 18, alkaline phosphatase 149, albumin 2.5, and total bilirubin 1.4.  Patient was given a liter of IV fluids, 10 mill equivalents IV potassium, morphine, Toradol, and Zofran in the ED. ? ?HOSPITAL COURSE BY PROBLEM LIST ? ?Assessment and Plan: ?* Abdominal pain with vomiting ?-- given persistent CBD dilatation will ask for GI opinion ?-- unable to have MRI due to stimulator implanted  ?-- GI reports patient has had dilated CBD for many years  ?-- GI started patient on supplemental pancreatic enzymes ?-- Tolerating Heart healthy  diet.   ? ?Chronic pain syndrome ?-- resume home pain management regimen ? ?Pressure injury of skin ?-- continue skin care protocol  ? ?Intractable nausea and vomiting ?-- resolved now with supportive measures  ? ?Protein-calorie malnutrition, severe ?--appreciate dietitian consultation and recommendations ?--resume home dietary supplements  ? ?Chronic respiratory failure with hypoxia (HCC) ?-- currently stable on home oxygen requirement  ? ?Hypokalemia ?-- repleted, given additional magnesium ?-- follow up outpatient  ? ?Tobacco abuse ?-- Pt strongly advised to stop all tobacco usage  ? ?COPD GOLD II  ?-- stable, no active symptoms of acute exacerbation  ? ?HTN (hypertension) ?-- well managed, following ? ? ?Discharge Diagnoses:  ?Principal Problem: ?  Abdominal pain with vomiting ?Active Problems: ?  HTN (hypertension) ?  COPD GOLD II  ?  Tobacco abuse ?  Hypokalemia ?  Chronic respiratory failure with hypoxia (HCC) ?  Protein-calorie malnutrition, severe ?  Intractable nausea and vomiting ?  Pressure injury of skin ?  Chronic pain syndrome ? ?Discharge Instructions: ? ?Allergies as of 07/03/2021   ? ?   Reactions  ? Lyrica [pregabalin] Other (See Comments)  ? EXTREME SHAKING/TREMBLING  ? Darvon [propoxyphene]   ? UNSPECIFIED REACTION   ? Gabapentin   ? Extreme shaking and trembling   ? Augmentin [amoxicillin-pot Clavulanate] Nausea And Vomiting  ? Has patient had a PCN reaction causing immediate rash, facial/tongue/throat swelling, SOB or lightheadedness with hypotension:no ?Has patient had a PCN reaction causing severe rash involving mucus membranes or skin necrosis:No ?Has patient had a PCN reaction that required hospitalization:No ?Has patient had a PCN reaction occurring within the last  10 years:Yes--ONLY N/V ?If all of the above answers are "NO", then may proceed with Cephalosporin use.  ? Erythromycin Itching, Rash  ?   ? Vibramycin [doxycycline Calcium] Itching, Rash  ? "SPLOTCHING "  ? ?  ? ?  ?Medication  List  ?  ? ?STOP taking these medications   ? ?lisinopril 5 MG tablet ?Commonly known as: ZESTRIL ?  ?terbinafine 250 MG tablet ?Commonly known as: LamISIL ?  ? ?  ? ?TAKE these medications   ? ?albuterol 108 (90 Base) MCG/ACT inhaler ?Commonly known as: VENTOLIN HFA ?USE 2 INHALATIONS EVERY 6 HOURS AS NEEDED FOR WHEEZING OR SHORTNESS OF BREATH (NEED TO BE SEEN BEFORE NEXT REFILL) ?  ?amLODipine 10 MG tablet ?Commonly known as: NORVASC ?Take 1 tablet (10 mg total) by mouth daily. ?  ?atorvastatin 40 MG tablet ?Commonly known as: Lipitor ?Take 1 tablet (40 mg total) by mouth daily. ?  ?budesonide-formoterol 160-4.5 MCG/ACT inhaler ?Commonly known as: SYMBICORT ?USE 2 INHALATIONS TWICE A DAY ?  ?Cholecalciferol 125 MCG (5000 UT) capsule ?Take 5,000 Units by mouth daily. ?  ?dextromethorphan-guaiFENesin 30-600 MG 12hr tablet ?Commonly known as: [place] DM ?Take 1 tablet by mouth 2 (two) times daily. ?  ?DULoxetine 30 MG capsule ?Commonly known as: CYMBALTA ?Take 3 capsules (90 mg total) by mouth daily. ?  ?Ensure High Protein Liqd ?Drink 1 can 3 times daily. ?  ?magnesium oxide 400 (241.3 Mg) MG tablet ?Commonly known as: MAG-OX ?Take 400 mg by mouth 2 (two) times daily. ?  ?Metanx 3-90.314-2-35 MG Caps ?Take 1 tablet by mouth daily as needed. ?  ?metoprolol tartrate 25 MG tablet ?Commonly known as: LOPRESSOR ?Take 0.5 tablets (12.5 mg total) by mouth 2 (two) times daily. ?  ?nicotine 14 mg/24hr patch ?Commonly known as: Nicoderm CQ ?Place 1 patch (14 mg total) onto the skin daily. ?  ?omeprazole 40 MG capsule ?Commonly known as: PRILOSEC ?Take one capsule once to twice daily before a meal. ?  ?ondansetron 4 MG disintegrating tablet ?Commonly known as: Zofran ODT ?Take 1 tablet (4 mg total) by mouth every 8 (eight) hours as needed for nausea or vomiting. ?  ?oxybutynin 5 MG 24 hr tablet ?Commonly known as: DITROPAN-XL ?TAKE 1 TABLET(5 MG) BY MOUTH AT BEDTIME ?  ?oxyCODONE-acetaminophen 10-325 MG tablet ?Commonly known  as: PERCOCET ?Take 1 tablet by mouth 5 (five) times daily. ?  ?OxyCONTIN 15 mg 12 hr tablet ?Generic drug: oxyCODONE ?Take 15 mg by mouth every 12 (twelve) hours. ?  ?OXYGEN ?Inhale 4 L into the lungs See admin instructions. continuous ?  ?Pancrelipase (Lip-Prot-Amyl) 24000-76000 units Cpep ?Take 2 caps daily with meals and 1 cap with snacks ?  ?Poly-Iron 150 Forte 150-0.025-1 MG Caps ?Generic drug: Iron Polysacch Cmplx-B12-FA ?Take 1 capsule by mouth daily. ?  ?polyethylene glycol 17 g packet ?Commonly known as: MiraLax ?Take 17 g by mouth daily. ?  ?promethazine 25 MG suppository ?Commonly known as: PHENERGAN ?Place 1 suppository (25 mg total) rectally every 6 (six) hours as needed for nausea or vomiting. ?  ?Spiriva Respimat 2.5 MCG/ACT Aers ?Generic drug: Tiotropium Bromide Monohydrate ?USE 2 INHALATIONS DAILY ?What changed: See the new instructions. ?  ?tizanidine 6 MG capsule ?Commonly known as: ZANAFLEX ?Take 1 capsule (6 mg total) by mouth 3 (three) times daily as needed for muscle spasms. ?What changed:  ?when to take this ?reasons to take this ?  ?traZODone 150 MG tablet ?Commonly known as: DESYREL ?TAKE 1 TO 2 TABLETS AT BEDTIME ?  ? ?  ? ?  Follow-up Information   ? ? Ronnie Doss M, DO. Schedule an appointment as soon as possible for a visit in 1 week(s).   ?Specialty: Family Medicine ?Why: Hospital Follow Up ?Contact information: ?[place] [address] ?[phone] ? ? ?  ?  ? ? [facility]. Schedule an appointment as soon as possible for a visit in 1 week(s).   ?Why: Hospital Follow Up ?Contact information: ?[address] ?[place] ?[phone] ? ?  ?  ? ?  ?  ? ?  ? ?Allergies  ?Allergen Reactions  ? Lyrica [Pregabalin] Other (See Comments)  ?  EXTREME SHAKING/TREMBLING  ? Darvon [Propoxyphene]   ?  UNSPECIFIED REACTION   ? Gabapentin   ?  Extreme shaking and trembling   ? Augmentin [Amoxicillin-Pot Clavulanate] Nausea And  Vomiting  ?  Has patient had a PCN reaction causing immediate rash, facial/tongue/throat swelling, SOB or lightheadedness with hypotension:no ?Has patient had a PCN reaction causing severe rash involving mucus

## 2021-07-04 ENCOUNTER — Ambulatory Visit (HOSPITAL_COMMUNITY): Payer: Medicare Other | Admitting: Hematology

## 2021-07-04 ENCOUNTER — Telehealth: Payer: Self-pay

## 2021-07-04 NOTE — Telephone Encounter (Signed)
Transition Care Management Follow-up Telephone Call ?Date of discharge and from where: 07/03/21 - [facility] - intractable nausea and vomiting ?How have you been since you were released from the hospital? Better now - eating well and able to hold it down ?Any questions or concerns? No ? ?Items Reviewed: ?Did the pt receive and understand the discharge instructions provided? Yes  ?Medications obtained and verified? Yes  ?Other? No  ?Any new allergies since your discharge? No  ?Dietary orders reviewed? Yes ?Do you have support at home? Yes  ? ?Home Care and Equipment/Supplies: ?Were home health services ordered? Hospitalist suggested it, but she declined ? ?Were any new equipment or medical supplies ordered?  No ? ?Functional Questionnaire: (I = Independent and D = Dependent) ?ADLs: I ? ?Bathing/Dressing- I ? ?Meal Prep- I ? ?Eating- I ? ?Maintaining continence- I ? ?Transferring/Ambulation- I ? ?Managing Meds- I ? ?Follow up appointments reviewed: ? ?PCP Hospital f/u appt confirmed?  SHE HAS APPT ALREADY ON 07/11/21 IN 15 MINUTES FOR 6 MONTH F/U - REFUSED TO CHANGE TO 5/12 SO THAT WE COULD DO TCM IN 30 MINUTE SLOT   ?[facility] f/u appt confirmed?  WAITING ON CALL BACK FROM GI FOR ENDOSCOPY AND FURTHER TESTING   ?Are transportation arrangements needed? No  ?If their condition worsens, is the pt aware to call PCP or go to the Emergency Dept.? Yes ?Was the patient provided with contact information for the PCP's office or ED? Yes ?Was to pt encouraged to call back with questions or concerns? Yes  ?

## 2021-07-06 ENCOUNTER — Ambulatory Visit: Payer: Medicare Other | Admitting: Gastroenterology

## 2021-07-11 ENCOUNTER — Ambulatory Visit (INDEPENDENT_AMBULATORY_CARE_PROVIDER_SITE_OTHER): Payer: Medicare Other | Admitting: Family Medicine

## 2021-07-11 ENCOUNTER — Other Ambulatory Visit: Payer: Self-pay | Admitting: Thoracic Surgery (Cardiothoracic Vascular Surgery)

## 2021-07-11 ENCOUNTER — Encounter: Payer: Self-pay | Admitting: Family Medicine

## 2021-07-11 VITALS — BP 125/77 | HR 125 | Temp 97.4°F | Ht 63.0 in | Wt 82.2 lb

## 2021-07-11 DIAGNOSIS — K1379 Other lesions of oral mucosa: Secondary | ICD-10-CM

## 2021-07-11 DIAGNOSIS — E876 Hypokalemia: Secondary | ICD-10-CM | POA: Diagnosis not present

## 2021-07-11 DIAGNOSIS — G479 Sleep disorder, unspecified: Secondary | ICD-10-CM

## 2021-07-11 DIAGNOSIS — C3491 Malignant neoplasm of unspecified part of right bronchus or lung: Secondary | ICD-10-CM

## 2021-07-11 DIAGNOSIS — Z9889 Other specified postprocedural states: Secondary | ICD-10-CM

## 2021-07-11 DIAGNOSIS — F321 Major depressive disorder, single episode, moderate: Secondary | ICD-10-CM

## 2021-07-11 DIAGNOSIS — R112 Nausea with vomiting, unspecified: Secondary | ICD-10-CM | POA: Diagnosis not present

## 2021-07-11 DIAGNOSIS — M25472 Effusion, left ankle: Secondary | ICD-10-CM

## 2021-07-11 DIAGNOSIS — Z09 Encounter for follow-up examination after completed treatment for conditions other than malignant neoplasm: Secondary | ICD-10-CM

## 2021-07-11 DIAGNOSIS — F418 Other specified anxiety disorders: Secondary | ICD-10-CM

## 2021-07-11 DIAGNOSIS — H918X9 Other specified hearing loss, unspecified ear: Secondary | ICD-10-CM

## 2021-07-11 MED ORDER — MAGIC MOUTHWASH W/LIDOCAINE: ORAL | 0 refills | Status: DC

## 2021-07-11 MED ORDER — MIRTAZAPINE 7.5 MG PO TABS: 7.5000 mg | ORAL_TABLET | Freq: Every day | ORAL | 1 refills | Status: DC

## 2021-07-11 NOTE — Patient Instructions (Signed)
Cross taper Cymbalta 30mg  with New rx for Mirtazapine 7.5mg  at bedtime. ?

## 2021-07-11 NOTE — Addendum Note (Signed)
Addended by: Janora Norlander on: 07/11/2021 02:32 PM ? ? Modules accepted: Level of Service ? ?

## 2021-07-11 NOTE — Progress Notes (Signed)
? ?Subjective: ?CC: 43-month follow-up but has multiple concerns ?PCP: Janora Norlander, DO ?OHY:[id number] Jasmine [name] is a 66 y.o. female presenting to clinic today for: ? ?1.  Hospital follow-up ?Patient was hospitalized recently for intractable nausea and vomiting.  Uncertain etiology at this time but there plans for EUS to be performed with [facility] in [place].  She has an appointment for hospital follow-up with Dr. Gala Romney soon.  The EUS has not yet been scheduled.  She has gained weight back since her discharge from the hospital.  Apparently her lowest weight was around 75 pounds.  Her labs were remarkable for hypokalemia, anemia.  Ultrasound of her abdomen demonstrated dilated common bile duct without evidence of obstructing mass or stone.  She had mild gallbladder wall thickening as well.  Evidence of fatty liver disease on CT abdomen noted. ? ?2.  Depression and anxiety, sleep ?Patient reports difficulty with sleep.  She is currently only using 150 mg of the trazodone, down from 300 mg.  Uncertain if this medication is even helpful so she has been trying to wean from it.  Treated with Cymbalta 90 mg daily.  Again she has been self weaning and has been on only 60 mg for the last 2 months.  She had this added initially for pain control but now that she has other modalities of pain control she thinks that she can go without this I would like to try something else for mood.  She is certainly overwhelmed by multiple medical problems and this affects her mental and emotional health. ? ?3.  Difficulty with hearing ?Patient reports bilateral hearing difficulty but feels that the left side is worse than the right.  She has a history of surgical resection of cartilage on the left side.  Would like referral to ENT for further evaluation. ? ?4.  Left ankle swelling ?Patient reports swelling that has recurred in that left ankle.  She reports it does not really seem to affect the left lower leg but rather the very distal  lower extremity.  She reports that sometimes it looks like "sausage coming out of the casing".  She has had falls in the past but otherwise does not really have a source of etiology.  It is typically just unilateral.  She unfortunately does not have the use of her right ankle and worries that she will lose her ability to utilize the left ankle as well.  She understandably does not want to be confined to a wheelchair. ? ?5.  Oral irritation ?Patient has had history of thrush and other oral irritants that have required D[employer]' Magic mouthwash previously.  She would like to get a renewal on this as she is having extremely difficult time with dry mouth despite use of Biotene. ? ?6.  Need for cataract extraction ?Patient brings another form for clearance for cataract extraction.  She is been having quite a time with her vision and really wants to have this done.  They are requiring repeat evaluation due to recent hospitalization, etc.  Patient notes that she can cleared by her other specialists but needs a medical clearance from me today. ? ? ?ROS: Per HPI ? ?Allergies  ?Allergen Reactions  ? Lyrica [Pregabalin] Other (See Comments)  ?  EXTREME SHAKING/TREMBLING  ? Darvon [Propoxyphene]   ?  UNSPECIFIED REACTION   ? Gabapentin   ?  Extreme shaking and trembling   ? Augmentin [Amoxicillin-Pot Clavulanate] Nausea And Vomiting  ?  Has patient had a PCN reaction causing  immediate rash, facial/tongue/throat swelling, SOB or lightheadedness with hypotension:no ?Has patient had a PCN reaction causing severe rash involving mucus membranes or skin necrosis:No ?Has patient had a PCN reaction that required hospitalization:No ?Has patient had a PCN reaction occurring within the last 10 years:Yes--ONLY N/V ?If all of the above answers are "NO", then may proceed with Cephalosporin use. ?  ? Erythromycin Itching and Rash  ?   ?  ? Vibramycin [Doxycycline Calcium] Itching and Rash  ?  "SPLOTCHING "  ? ?Past Medical History:  ?Diagnosis  Date  ? Allergy   ? Anemia   ? Anxiety   ? Arthritis   ? Asthma   ? Carpal tunnel syndrome   ? Chronic back pain   ? Cigarette smoker 08/25/2017  ? Complication of anesthesia   ? in the past has had N/V, the last few surgeries she has not been  ? COPD (chronic obstructive pulmonary disease) ([place])   ? Easy bruising 07/10/2016  ? GERD (gastroesophageal reflux disease)   ? Headache   ? Heart murmur 2005  ? never had any problems  ? History of blood transfusion 2018  ? History of kidney stones   ? Hypertension   ? Hypoglycemia   ? Hypoglycemia   ? Iron deficiency anemia 07/11/2016  ? Neuropathy   ? both arms  ? Normocytic anemia 07/29/2015  ? Pancreatitis   ? Pneumonia   ? PONV (postoperative nausea and vomiting)   ? Postoperative anemia due to acute blood loss 11/15/2015  ? R lung cancer   ? Sciatica   ? Seizures ([place])   ? as a child when she would have an asthma attack. none as an adult  ? Shortness of breath dyspnea   ? ? ?Current Outpatient Medications:  ?  albuterol (VENTOLIN HFA) 108 (90 Base) MCG/ACT inhaler, USE 2 INHALATIONS EVERY 6 HOURS AS NEEDED FOR WHEEZING OR SHORTNESS OF BREATH (NEED TO BE SEEN BEFORE NEXT REFILL), Disp: 25.5 g, Rfl: 3 ?  amLODipine (NORVASC) 10 MG tablet, Take 1 tablet (10 mg total) by mouth daily., Disp: 30 tablet, Rfl: 1 ?  atorvastatin (LIPITOR) 40 MG tablet, Take 1 tablet (40 mg total) by mouth daily., Disp: 90 tablet, Rfl: 3 ?  budesonide-formoterol (SYMBICORT) 160-4.5 MCG/ACT inhaler, USE 2 INHALATIONS TWICE A DAY, Disp: 20.4 g, Rfl: 5 ?  Cholecalciferol 125 MCG (5000 UT) capsule, Take 5,000 Units by mouth daily., Disp: , Rfl:  ?  dextromethorphan-guaiFENesin (MUCINEX DM) 30-600 MG 12hr tablet, Take 1 tablet by mouth 2 (two) times daily., Disp: , Rfl:  ?  DULoxetine (CYMBALTA) 30 MG capsule, Take 3 capsules (90 mg total) by mouth daily., Disp: 270 capsule, Rfl: 4 ?  Iron Polysacch Cmplx-B12-FA (POLY-IRON 150 FORTE) 150-0.025-1 MG CAPS, Take 1 capsule by mouth daily., Disp: 60 capsule,  Rfl: 2 ?  L-Methylfolate-Algae-B12-B6 (METANX) 3-90.314-2-35 MG CAPS, Take 1 tablet by mouth daily as needed., Disp: , Rfl:  ?  lipase/protease/amylase 24000-76000 units CPEP, Take 2 caps daily with meals and 1 cap with snacks, Disp: 270 capsule, Rfl: 2 ?  magnesium oxide (MAG-OX) 400 (241.3 Mg) MG tablet, Take 400 mg by mouth 2 (two) times daily., Disp: , Rfl:  ?  metoprolol tartrate (LOPRESSOR) 25 MG tablet, Take 0.5 tablets (12.5 mg total) by mouth 2 (two) times daily., Disp: 30 tablet, Rfl: 1 ?  nicotine (NICODERM CQ) 14 mg/24hr patch, Place 1 patch (14 mg total) onto the skin daily., Disp: 30 patch, Rfl: 0 ?  Nutritional  Supplements (ENSURE HIGH PROTEIN) LIQD, Drink 1 can 3 times daily., Disp: 21330 mL, Rfl: PRN ?  omeprazole (PRILOSEC) 40 MG capsule, Take one capsule once to twice daily before a meal., Disp: 90 capsule, Rfl: 3 ?  ondansetron (ZOFRAN ODT) 4 MG disintegrating tablet, Take 1 tablet (4 mg total) by mouth every 8 (eight) hours as needed for nausea or vomiting., Disp: 20 tablet, Rfl: 0 ?  oxybutynin (DITROPAN-XL) 5 MG 24 hr tablet, TAKE 1 TABLET(5 MG) BY MOUTH AT BEDTIME, Disp: 90 tablet, Rfl: 0 ?  oxyCODONE-acetaminophen (PERCOCET) 10-325 MG tablet, Take 1 tablet by mouth 5 (five) times daily., Disp: , Rfl:  ?  OXYCONTIN 15 MG 12 hr tablet, Take 15 mg by mouth every 12 (twelve) hours., Disp: , Rfl:  ?  OXYGEN, Inhale 4 L into the lungs See admin instructions. continuous, Disp: , Rfl:  ?  polyethylene glycol (MIRALAX) 17 g packet, Take 17 g by mouth daily., Disp: 28 each, Rfl: 2 ?  promethazine (PHENERGAN) 25 MG suppository, Place 1 suppository (25 mg total) rectally every 6 (six) hours as needed for nausea or vomiting., Disp: 12 each, Rfl: 1 ?  SPIRIVA RESPIMAT 2.5 MCG/ACT AERS, USE 2 INHALATIONS DAILY (Patient taking differently: Inhale 2 puffs into the lungs daily.), Disp: 12 g, Rfl: 3 ?  tizanidine (ZANAFLEX) 6 MG capsule, Take 1 capsule (6 mg total) by mouth 3 (three) times daily as needed for  muscle spasms., Disp: , Rfl:  ?  traZODone (DESYREL) 150 MG tablet, TAKE 1 TO 2 TABLETS AT BEDTIME, Disp: 180 tablet, Rfl: 3 ?Social History  ? ?Socioeconomic History  ? Marital status: Married  ?  Spouse n

## 2021-07-12 ENCOUNTER — Ambulatory Visit
Admission: RE | Admit: 2021-07-12 | Discharge: 2021-07-12 | Disposition: A | Discharge status: Home / Self Care | Payer: Medicare Other | Source: Ambulatory Visit | Attending: Thoracic Surgery (Cardiothoracic Vascular Surgery) | Admitting: Thoracic Surgery (Cardiothoracic Vascular Surgery)

## 2021-07-12 ENCOUNTER — Ambulatory Visit (INDEPENDENT_AMBULATORY_CARE_PROVIDER_SITE_OTHER): Payer: Medicare Other | Admitting: Thoracic Surgery (Cardiothoracic Vascular Surgery)

## 2021-07-12 VITALS — BP 103/66 | HR 100 | Resp 20 | Ht 63.0 in | Wt 81.0 lb

## 2021-07-12 DIAGNOSIS — Z902 Acquired absence of lung [part of]: Secondary | ICD-10-CM

## 2021-07-12 DIAGNOSIS — C3411 Malignant neoplasm of upper lobe, right bronchus or lung: Secondary | ICD-10-CM

## 2021-07-12 DIAGNOSIS — C3491 Malignant neoplasm of unspecified part of right bronchus or lung: Secondary | ICD-10-CM

## 2021-07-12 NOTE — Progress Notes (Signed)
? ?   ?[address] ?      [place] [address] ?            [id number]   ? ? ?HPI: Jasmine [name] returns for a scheduled follow-up visit ? ?Jasmine [name] is a 66 year old woman with a history of tobacco abuse, COPD, chronic pain, asthma, reflux, hypertension, anemia, anxiety, peripheral neuropathy, pancreatitis, pneumonia, and seizures.  I did a robotic assisted right upper lobectomy in May 2022.  That was for a T2, N0, stage Ib non-small cell carcinoma. ? ?She had a lot of intercostal neuralgia pain for several months after the surgery.  She says that has completely resolved.  She does remain on home oxygen.  Her primary issues recently have been gastrointestinal related and she is following up with gastroenterology.  She is scheduled to have an endoscopic procedure to evaluate her dilated bile ducts. ? ?Past Medical History:  ?Diagnosis Date  ? Allergy   ? Anemia   ? Anxiety   ? Arthritis   ? Asthma   ? Carpal tunnel syndrome   ? Chronic back pain   ? Cigarette smoker 08/25/2017  ? Complication of anesthesia   ? in the past has had N/V, the last few surgeries she has not been  ? COPD (chronic obstructive pulmonary disease) ([place])   ? Easy bruising 07/10/2016  ? GERD (gastroesophageal reflux disease)   ? Headache   ? Heart murmur 2005  ? never had any problems  ? History of blood transfusion 2018  ? History of kidney stones   ? Hypertension   ? Hypoglycemia   ? Hypoglycemia   ? Iron deficiency anemia 07/11/2016  ? Neuropathy   ? both arms  ? Normocytic anemia 07/29/2015  ? Pancreatitis   ? Pneumonia   ? PONV (postoperative nausea and vomiting)   ? Postoperative anemia due to acute blood loss 11/15/2015  ? R lung cancer   ? Sciatica   ? Seizures ([place])   ? as a child when she would have an asthma attack. none as an adult  ? Shortness of breath dyspnea   ? ? ?Current Outpatient Medications  ?Medication Sig Dispense Refill  ? albuterol (VENTOLIN HFA) 108 (90 Base) MCG/ACT inhaler USE 2 INHALATIONS EVERY 6  HOURS AS NEEDED FOR WHEEZING OR SHORTNESS OF BREATH (NEED TO BE SEEN BEFORE NEXT REFILL) 25.5 g 3  ? amLODipine (NORVASC) 10 MG tablet Take 1 tablet (10 mg total) by mouth daily. 30 tablet 1  ? atorvastatin (LIPITOR) 40 MG tablet Take 1 tablet (40 mg total) by mouth daily. 90 tablet 3  ? budesonide-formoterol (SYMBICORT) 160-4.5 MCG/ACT inhaler USE 2 INHALATIONS TWICE A DAY 20.4 g 5  ? Cholecalciferol 125 MCG (5000 UT) capsule Take 5,000 Units by mouth daily.    ? dextromethorphan-guaiFENesin (MUCINEX DM) 30-600 MG 12hr tablet Take 1 tablet by mouth 2 (two) times daily.    ? DULoxetine (CYMBALTA) 30 MG capsule Take 3 capsules (90 mg total) by mouth daily. 270 capsule 4  ? Iron Polysacch Cmplx-B12-FA (POLY-IRON 150 FORTE) 150-0.025-1 MG CAPS Take 1 capsule by mouth daily. 60 capsule 2  ? L-Methylfolate-Algae-B12-B6 (METANX) 3-90.314-2-35 MG CAPS Take 1 tablet by mouth daily as needed.    ? lipase/protease/amylase 24000-76000 units CPEP Take 2 caps daily with meals and 1 cap with snacks 270 capsule 2  ? magic mouthwash w/lidocaine SOLN Gargle and spit 80mL every 6 hours as needed for sore throat. 68mL nystatin, 60mg  hydrocortisone tab,  qs benadryl total 411mL. 480 mL 0  ? magnesium oxide (MAG-OX) 400 (241.3 Mg) MG tablet Take 400 mg by mouth 2 (two) times daily.    ? metoprolol tartrate (LOPRESSOR) 25 MG tablet Take 0.5 tablets (12.5 mg total) by mouth 2 (two) times daily. 30 tablet 1  ? mirtazapine (REMERON) 7.5 MG tablet Take 1 tablet (7.5 mg total) by mouth at bedtime. Cross taper with 30mg  of Cymbalta for 3-4 weeks. Then stop cymbalta 30 tablet 1  ? nicotine (NICODERM CQ) 14 mg/24hr patch Place 1 patch (14 mg total) onto the skin daily. 30 patch 0  ? Nutritional Supplements (ENSURE HIGH PROTEIN) LIQD Drink 1 can 3 times daily. 21330 mL PRN  ? omeprazole (PRILOSEC) 40 MG capsule Take one capsule once to twice daily before a meal. 90 capsule 3  ? oxybutynin (DITROPAN-XL) 5 MG 24 hr tablet TAKE 1 TABLET(5 MG) BY  MOUTH AT BEDTIME 90 tablet 0  ? oxyCODONE-acetaminophen (PERCOCET) 10-325 MG tablet Take 1 tablet by mouth 5 (five) times daily.    ? OXYCONTIN 15 MG 12 hr tablet Take 15 mg by mouth every 12 (twelve) hours.    ? OXYGEN Inhale 4 L into the lungs See admin instructions. continuous    ? polyethylene glycol (MIRALAX) 17 g packet Take 17 g by mouth daily. 28 each 2  ? promethazine (PHENERGAN) 25 MG suppository Place 1 suppository (25 mg total) rectally every 6 (six) hours as needed for nausea or vomiting. 12 each 1  ? SPIRIVA RESPIMAT 2.5 MCG/ACT AERS USE 2 INHALATIONS DAILY (Patient taking differently: Inhale 2 puffs into the lungs daily.) 12 g 3  ? tizanidine (ZANAFLEX) 6 MG capsule Take 1 capsule (6 mg total) by mouth 3 (three) times daily as needed for muscle spasms.    ? traZODone (DESYREL) 150 MG tablet TAKE 1 TO 2 TABLETS AT BEDTIME 180 tablet 3  ? ondansetron (ZOFRAN ODT) 4 MG disintegrating tablet Take 1 tablet (4 mg total) by mouth every 8 (eight) hours as needed for nausea or vomiting. (Patient not taking: Reported on 07/12/2021) 20 tablet 0  ? ?No current facility-administered medications for this visit.  ? ? ?Physical Exam ?Thin 66 year old woman in no acute distress ?Wearing nasal cannula oxygen ?Lungs diminished breath sounds bilaterally, no wheezing ?Cardiac regular rate and rhythm ? ?Diagnostic Tests: ?CT CHEST WITH CONTRAST ?  ?TECHNIQUE: ?Multidetector CT imaging of the chest was performed during ?intravenous contrast administration. ?  ?RADIATION DOSE REDUCTION: This exam was performed according to the ?departmental dose-optimization program which includes automated ?exposure control, adjustment of the mA and/or kV according to ?patient size and/or use of iterative reconstruction technique. ?  ?CONTRAST:  3mL OMNIPAQUE IOHEXOL 300 MG/ML  SOLN ?  ?COMPARISON:  02/21/2021 ?  ?FINDINGS: ?Cardiovascular: Aortic atherosclerosis. Normal heart size. ?Three-vessel coronary artery calcifications. No  pericardial ?effusion. ?  ?Mediastinum/Nodes: No enlarged mediastinal, hilar, or axillary lymph ?nodes. Thyroid gland, trachea, and esophagus demonstrate no ?significant findings. ?  ?Lungs/Pleura: Unchanged postoperative findings of right upper ?lobectomy. Severe emphysema. Unchanged 0.3 cm nodule of the anterior ?left upper lobe (series 4, image 36). No pleural effusion or ?pneumothorax. ?  ?Upper Abdomen: No acute abnormality. Hepatic steatosis. Partially ?imaged intra and extrahepatic biliary ductal dilatation. ?  ?Musculoskeletal: No chest wall abnormality. No suspicious osseous ?lesions identified. ?  ?IMPRESSION: ?1. Unchanged postoperative findings of right upper lobectomy. No ?evidence of recurrent or metastatic disease in the chest. ?2. Unchanged 0.3 cm nodule of the anterior left upper  lobe, most ?likely benign and incidental sequelae of infection or inflammation. ?Attention on follow-up. ?3. Severe emphysema. ?4. Coronary artery disease. ?5. Hepatic steatosis. ?6. Partially imaged intra and extrahepatic biliary ductal ?dilatation, unchanged compared to prior examination. Consider ?dedicated imaging of the abdomen and pelvis to further evaluate for ?biliary obstruction. ?  ?Aortic Atherosclerosis (ICD10-I70.0) and Emphysema (ICD10-J43.9). ?  ?  ?Electronically Signed ?  By: Delanna Ahmadi M.D. ?  On: 06/27/2021 15:18 ? ?I personally reviewed the CT images.  Also reviewed the chest x-ray images from today the chest x-ray does not show any findings new from the CT.  CT shows postoperative changes but no evidence of recurrent disease.  Severe emphysema. ? ?Impression: ?Jasmine [name]  is a 66 year old woman with a history of tobacco abuse, COPD, chronic pain, asthma, reflux, hypertension, anemia, anxiety, peripheral neuropathy, pancreatitis, pneumonia, and seizures.   ? ?Stage Ib non-small cell carcinoma right upper lobe-status post right upper lobectomy.  Did not require any adjuvant therapy.  No evidence of  recurrent disease on CT from 2 weeks ago.  We will continue to be followed by Dr. Delton Coombes. ? ?Dilated bile ducts-being evaluated by gastroenterology. ? ?Protein calorie malnutrition-has had a lot of difficulty with

## 2021-07-12 NOTE — Progress Notes (Deleted)
GI Office Note    Referring Provider: Janora Norlander, DO Primary Care Physician:  Janora Norlander, DO Primary GI: Dr. Gala Romney  Date:  07/12/2021  ID:  Jasmine [name], DOB Jul 22, 1955, MRN [id number]   Chief Complaint   No chief complaint on file.    History of Present Illness  Jasmine [name] is a 66 y.o. female presenting today with a history of COPD, pancreatitis in 2020 (unclear etiology as patient sided only occasional EtOH use although she presented with positive EtOH level in January 2022 with dilated pancreatic duct/pancreatic insufficiency with abnormal pancreatic elastase in 2022), chronically dilated CBD, history of C. 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   Last EGD 06/14/20 Normal esophagus Erosive gastropathy, predominantly antrum, marked erythema (hyperemia and mild reactive changes) Normal duodenum  Last colonoscopy 06/15/2018 Four 6-9 mm polyps at the splenic flexure and hepatic flexure Single 20 mg of at the hepatic flexure Pathology: All tubular adenomas.   Imaging: RUQ ultrasound 4/24: Mild gallbladder wall thickening, no gallstones, CBD 13 mm CT A/P with contrast 03/11/2021: Stable intrahepatic and extrahepatic biliary ductal dilation compared to 9/22, gallbladder unremarkable, pancreas with calcifications and pancreatic head and uncinate process similar to prior study, 1 calcification in the region of the ampulla, pancreatic duct dilation, stable to prior study  She was seen in the hospital by the GI service in November 4270 for uncomplicated pancreatitis, she had CBD dilation to 14 mm and pancreatic ductal dilation at 8 mm at that time, she was to have an outpatient EUS 6 weeks after discharge however she canceled it due to issues of with her COPD.  She was seen  in the office in February 2022 at which time she was being worked up for pulmonary nodule and subsequently was diagnosed with lung cancer, now status post resection in May 2022.  Most recent hospitalization: She was having intermittent nausea and vomiting requiring hospitalization.  Not having any abdominal pain, had denied any heartburn or dysphagia, recent NSAID use, or significant alcohol use.  She has Phenergan suppositories that she was using at home but has not given her much relief.  Of note she is on chronic pain medications.  Had denied any diarrhea or constipation.  It appears that her symptoms were likely related to chronic pancreatitis and pancreatic insufficiency given pancreatic elastase less than 50.  LFTs have been unremarkable other than mild bump in her alkaline phosphatase.  She was treated with Compazine every 6 hours as needed, supportive measures, PPI, and initiated pancreatic enzymes while inpatient.  There was significant improvement in her symptoms after initiation of pancreatic enzymes and she was discharged within 72 hours after initiation.  She continues to need EUS for further evaluation of chronically dilated ducts.  Intermittent nausea/vomiting could also be due to gastroparesis due to chronic opioid use or sphincter of Oddi dysfunction.  Today:      Past Medical History:  Diagnosis Date   Allergy    Anemia    Anxiety    Arthritis    Asthma    Carpal tunnel syndrome    Chronic back pain    Cigarette smoker 08/27/7626   Complication of anesthesia    in the past has had N/V, the last few surgeries she has not been   COPD (chronic obstructive pulmonary disease) ([place])  Easy bruising 07/10/2016   GERD (gastroesophageal reflux disease)    Headache    Heart murmur 2005   never had any problems   History of blood transfusion 2018   History of kidney stones    Hypertension    Hypoglycemia    Hypoglycemia    Iron deficiency anemia 07/11/2016   Neuropathy    both  arms   Normocytic anemia 07/29/2015   Pancreatitis    Pneumonia    PONV (postoperative nausea and vomiting)    Postoperative anemia due to acute blood loss 11/15/2015   R lung cancer    Sciatica    Seizures ([place])    as a child when she would have an asthma attack. none as an adult   Shortness of breath dyspnea     Past Surgical History:  Procedure Laterality Date   ABDOMINAL HYSTERECTOMY  0981   APPLICATION OF ROBOTIC ASSISTANCE FOR SPINAL PROCEDURE N/A 09/05/2016   Procedure: APPLICATION OF ROBOTIC ASSISTANCE FOR SPINAL PROCEDURE;  Surgeon: Consuella Lose, MD;  Location: [facility];  Service: Neurosurgery;  Laterality: N/A;   BACK SURGERY     BIOPSY  06/14/2020   Procedure: BIOPSY;  Surgeon: Daneil Dolin, MD;  [facility];  Service: Endoscopy;;   [place]   COLONOSCOPY     unsure last   COLONOSCOPY WITH PROPOFOL N/A 06/14/2020   Procedure: COLONOSCOPY WITH PROPOFOL;  Surgeon: Daneil Dolin, MD;  [facility];  Service: Endoscopy;  Laterality: N/A;  PM   cyst removal face     disk repair     neck and lumbar region   ESOPHAGOGASTRODUODENOSCOPY (EGD) WITH PROPOFOL N/A 06/14/2020   Procedure: ESOPHAGOGASTRODUODENOSCOPY (EGD) WITH PROPOFOL;  Surgeon: Daneil Dolin, MD;  [facility];  Service: Endoscopy;  Laterality: N/A;   FOOT SURGERY Bilateral    to file bones down    HARDWARE REMOVAL N/A 09/05/2016   Procedure: HARDWARE REMOVAL AND REPLACEMENT OF LUMBAR FIVE SCREWS;  Surgeon: Consuella Lose, MD;  Location: [place];  Service: Neurosurgery;  Laterality: N/A;   IMPLANTATION / PLACEMENT EPIDURAL NEUROSTIMULATOR ELECTRODES     INTERCOSTAL NERVE BLOCK Right 07/29/2020   Procedure: INTERCOSTAL NERVE BLOCK;  Surgeon: Melrose Nakayama, MD;  Location: [place];  Service: Thoracic;  Laterality: Right;   LAMINECTOMY     2006   lobectomy Right 07/2020   LUMBAR FUSION     LYMPH NODE BIOPSY Right 07/29/2020    Procedure: LYMPH NODE BIOPSY;  Surgeon: Melrose Nakayama, MD;  Location: [place];  Service: Thoracic;  Laterality: Right;   POLYPECTOMY  06/14/2020   Procedure: POLYPECTOMY INTESTINAL;  Surgeon: Daneil Dolin, MD;  [facility];  Service: Endoscopy;;   SPINAL CORD STIMULATOR BATTERY EXCHANGE     SPINAL CORD STIMULATOR INSERTION N/A 07/05/2016   Procedure: REPLACEMENT OF LUMBAR SPINAL CORD STIMULATOR BATTERY;  Surgeon: Newman Pies, MD;  Location: [place];  Service: Neurosurgery;  Laterality: N/A;   TONSILLECTOMY      Current Outpatient Medications  Medication Sig Dispense Refill   albuterol (VENTOLIN HFA) 108 (90 Base) MCG/ACT inhaler USE 2 INHALATIONS EVERY 6 HOURS AS NEEDED FOR WHEEZING OR SHORTNESS OF BREATH (NEED TO BE SEEN BEFORE NEXT REFILL) 25.5 g 3   amLODipine (NORVASC) 10 MG tablet Take 1 tablet (10 mg total) by mouth daily. 30 tablet 1   atorvastatin (LIPITOR) 40 MG tablet Take 1 tablet (40 mg  total) by mouth daily. 90 tablet 3   budesonide-formoterol (SYMBICORT) 160-4.5 MCG/ACT inhaler USE 2 INHALATIONS TWICE A DAY 20.4 g 5   Cholecalciferol 125 MCG (5000 UT) capsule Take 5,000 Units by mouth daily.     dextromethorphan-guaiFENesin (MUCINEX DM) 30-600 MG 12hr tablet Take 1 tablet by mouth 2 (two) times daily.     DULoxetine (CYMBALTA) 30 MG capsule Take 3 capsules (90 mg total) by mouth daily. 270 capsule 4   Iron Polysacch Cmplx-B12-FA (POLY-IRON 150 FORTE) 150-0.025-1 MG CAPS Take 1 capsule by mouth daily. 60 capsule 2   L-Methylfolate-Algae-B12-B6 (METANX) 3-90.314-2-35 MG CAPS Take 1 tablet by mouth daily as needed.     lipase/protease/amylase 24000-76000 units CPEP Take 2 caps daily with meals and 1 cap with snacks 270 capsule 2   magic mouthwash w/lidocaine SOLN Gargle and spit 60mL every 6 hours as needed for sore throat. 22mL nystatin, 60mg  hydrocortisone tab, qs benadryl total 483mL. 480 mL 0   magnesium oxide (MAG-OX) 400 (241.3 Mg) MG tablet Take 400 mg  by mouth 2 (two) times daily.     metoprolol tartrate (LOPRESSOR) 25 MG tablet Take 0.5 tablets (12.5 mg total) by mouth 2 (two) times daily. 30 tablet 1   mirtazapine (REMERON) 7.5 MG tablet Take 1 tablet (7.5 mg total) by mouth at bedtime. Cross taper with 30mg  of Cymbalta for 3-4 weeks. Then stop cymbalta 30 tablet 1   nicotine (NICODERM CQ) 14 mg/24hr patch Place 1 patch (14 mg total) onto the skin daily. 30 patch 0   Nutritional Supplements (ENSURE HIGH PROTEIN) LIQD Drink 1 can 3 times daily. 21330 mL PRN   omeprazole (PRILOSEC) 40 MG capsule Take one capsule once to twice daily before a meal. 90 capsule 3   ondansetron (ZOFRAN ODT) 4 MG disintegrating tablet Take 1 tablet (4 mg total) by mouth every 8 (eight) hours as needed for nausea or vomiting. (Patient not taking: Reported on 07/12/2021) 20 tablet 0   oxybutynin (DITROPAN-XL) 5 MG 24 hr tablet TAKE 1 TABLET(5 MG) BY MOUTH AT BEDTIME 90 tablet 0   oxyCODONE-acetaminophen (PERCOCET) 10-325 MG tablet Take 1 tablet by mouth 5 (five) times daily.     OXYCONTIN 15 MG 12 hr tablet Take 15 mg by mouth every 12 (twelve) hours.     OXYGEN Inhale 4 L into the lungs See admin instructions. continuous     polyethylene glycol (MIRALAX) 17 g packet Take 17 g by mouth daily. 28 each 2   promethazine (PHENERGAN) 25 MG suppository Place 1 suppository (25 mg total) rectally every 6 (six) hours as needed for nausea or vomiting. 12 each 1   SPIRIVA RESPIMAT 2.5 MCG/ACT AERS USE 2 INHALATIONS DAILY (Patient taking differently: Inhale 2 puffs into the lungs daily.) 12 g 3   tizanidine (ZANAFLEX) 6 MG capsule Take 1 capsule (6 mg total) by mouth 3 (three) times daily as needed for muscle spasms.     traZODone (DESYREL) 150 MG tablet TAKE 1 TO 2 TABLETS AT BEDTIME 180 tablet 3   No current facility-administered medications for this visit.    Allergies as of 07/14/2021 - Review Complete 07/12/2021  Allergen Reaction Noted   Lyrica [pregabalin] Other (See  Comments) 06/29/2016   Darvon [propoxyphene]  02/22/2015   Gabapentin  08/31/2016   Augmentin [amoxicillin-pot clavulanate] Nausea And Vomiting 03/19/2015   Erythromycin Itching and Rash 02/22/2015   Vibramycin [doxycycline calcium] Itching and Rash 02/22/2015    Family History  Problem Relation Age of Onset  Heart disease Mother    Diabetes Mother    Hypertension Mother    Hyperlipidemia Mother    COPD Mother        smoked   Cancer Mother        bile duct   COPD Father    Diabetes Father    Heart disease Father    Cancer Father        throat   Dementia Father    Asthma Son    Hypertension Sister    Hyperlipidemia Sister    Hypertension Brother    Hyperlipidemia Brother    Hypertension Brother    Hyperlipidemia Brother    Emphysema Maternal Grandmother        smoked   Colon cancer Neg Hx    Colon polyps Neg Hx     Social History   Socioeconomic History   Marital status: Married    Spouse name: [name]   Number of children: 1   Years of education: Not on file   Highest education level: Some college, no degree  Occupational History   Occupation: disability  Tobacco Use   Smoking status: Every Day    Packs/day: 0.25    Years: 35.00    Pack years: 8.75    Types: Cigarettes   Smokeless tobacco: Never   Tobacco comments:    trying to quit  Vaping Use   Vaping Use: Never used  Substance and Sexual Activity   Alcohol use: Yes    Alcohol/week: 2.0 standard drinks    Types: 2 Glasses of wine per week    Comment: occasionally   Drug use: No   Sexual activity: Yes    Birth control/protection: None  Other Topics Concern   Not on file  Social History Narrative   Retired, 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 1 son, deceased. 2 pet dogs. Enjoys watching TV.   Husband takes care of cooking, cleaning, driving, and helps her with ADLs including bathing   Social Determinants of Health   Financial Resource Strain: Low Risk    Difficulty of Paying Living  Expenses: Not very hard  Food Insecurity: No Food Insecurity   Worried About Charity fundraiser in the Last Year: Never true   Ran Out of Food in the Last Year: Never true  Transportation Needs: No Transportation Needs   Lack of Transportation (Medical): No   Lack of Transportation (Non-Medical): No  Physical Activity: Insufficiently Active   Days of Exercise per Week: 7 days   Minutes of Exercise per Session: 10 min  Stress: No Stress Concern Present   Feeling of Stress : Not at all  Social Connections: Socially Integrated   Frequency of Communication with Friends and Family: More than three times a week   Frequency of Social Gatherings with Friends and Family: More than three times a week   Attends Religious Services: 1 to 4 times per year   Active Member of [employer] or Organizations: Yes   Attends Archivist Meetings: 1 to 4 times per year   Marital Status: Married     Review of Systems   Gen: Denies fever, chills, anorexia. Denies fatigue, weakness, weight loss.  CV: Denies chest pain, palpitations, syncope, peripheral edema, and claudication. Resp: Denies dyspnea at rest, cough, wheezing, coughing up blood, and pleurisy. GI: see HPI Derm: Denies rash, itching, dry skin Psych: Denies depression, anxiety, memory loss, confusion. No homicidal or suicidal ideation.  Heme: Denies bruising, bleeding, and  enlarged lymph nodes.   Physical Exam   There were no vitals taken for this visit.  General:   Alert and oriented. No distress noted. Pleasant and cooperative.  Head:  Normocephalic and atraumatic. Eyes:  Conjuctiva clear without scleral icterus. Mouth:  Oral mucosa pink and moist. Good dentition. No lesions. Lungs:  Clear to auscultation bilaterally. No wheezes, rales, or rhonchi. No distress.  Heart:  S1, S2 present without murmurs appreciated.  Abdomen:  +BS, soft, non-tender and non-distended. No rebound or guarding. No HSM or masses noted. Rectal: *** Msk:   Symmetrical without gross deformities. Normal posture. Extremities:  Without edema. Neurologic:  Alert and  oriented x4 Psych:  Alert and cooperative. Normal mood and affect.   Assessment  Jasmine [name] is a 66 y.o. female with a history of COPD, pancreatitis in 2020 (unclear etiology as patient sided only occasional EtOH use although she presented with positive EtOH level in January 2022 with dilated pancreatic duct/pancreatic insufficiency with abnormal pancreatic elastase in 2022), chronically dilated CBD, history of C. 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  Chronic Dilated CBD/Pancreatic Insufficiency/Pancreatic Ductal Dilation/Recurrent Nausea/vomiting: Multiple imaging findings of dilated CBD and pancreatic duct dilation. Has been needing EUS for further evaluation since 2020/2021 however this has been complicated by respiratory issues and subsequent diagnosis of lung cancer. Has had multiple hospital admissions for intractable nausea and vomiting without abdominal pain. Was recently prescribed pancreatic enzymes (Creon 48000 units with meals and 24000 units with snacks) with improvement in her symptoms***. Will refer to [facility] for EUS.    PLAN   *** Continue Creon 48000 units with meals and 24000 units with snacks Continue omeprazole 40 mg once or twice daily before meals.  Referral to [facility] for EUS.     Venetia Night, MSN, FNP-BC, AGACNP-BC [facility]

## 2021-07-14 ENCOUNTER — Inpatient Hospital Stay: Payer: Medicare Other | Admitting: Gastroenterology

## 2021-07-18 ENCOUNTER — Ambulatory Visit: Payer: Medicare Other | Admitting: Internal Medicine

## 2021-07-18 ENCOUNTER — Encounter: Payer: Self-pay | Admitting: Internal Medicine

## 2021-07-18 DIAGNOSIS — J9611 Chronic respiratory failure with hypoxia: Secondary | ICD-10-CM

## 2021-07-18 NOTE — Progress Notes (Deleted)
Subjective:     Patient ID: Jasmine [name], female   DOB: 15-Feb-1956    MRN: [id number]    Brief patient profile: 53    yowf   active smoker/MM  dx with asthma as child but by HS outgrew it until her 50's with flares but better between until early 2000s and maintain on multliple inhalers and then 02/2016 hosp in [place] then [facility] April 2019 and newly started on 02 = 2lpm as needed daytime and hs and referred to pulmonary clinic 08/24/2017 by Dr   Danice Goltz with GOLD II spirometry documented 09/21/17     Admit date: 06/30/2017 Discharge date: 07/09/2017     Recommendations for Outpatient Follow-up:  pls refer to sleep medicine for osa testing/epworht/stopbang pls continue cessation counseling for Tob Needs cxr 1 mo Consider Megace/chantix? Recheck LFT and Magnesium in 1-2 weeks---has elevated LFT unknown sig [?CVC liver]   Discharge Diagnoses:  Principal Problem:   Acute respiratory failure with hypoxia (HCC) Active Problems:   Chronic back pain   HTN (hypertension)   Chronic narcotic use   Hypokalemia   COPD with acute exacerbation (HCC)   Malnutrition of moderate degree     Discharge Condition: please refert   Diet recommendation: eat anything to gain weight for now        [facility] Weights    06/30/17 2122 07/01/17 0500 07/02/17 0412  Weight: 47.2 kg (104 lb 0.9 oz) 47.2 kg (104 lb 0.9 oz) 48.1 kg (106 lb 0.7 oz)      History of present illness:  Please see note from Dr. Paulene Floor "66 y.o. female with a history of COPD, GERD, neuropathy, chronic pain on chronic narcotics, hypertension.  Patient presents with worsening shortness of breath over the past 3 to 4 weeks.  She presented to ED with acute respiratory distress with hypoxia and subsequently started on bipap"       Hospital Course:  Acute respiratory failure with hypoxia-patient says that she responded very well to nightly BiPAP.  She was able to sleep for the first time in a very long time. ?OSA-referrinf  to Sleep as OP.  Needs screening fro pulm htn as well? Could benefit from Cardio-pulm rehab if avail  [place] COPD [place]ation- continue current treatments with IV steroids, antibiotics and continuous around-the-clock neb treatments-narrowed and tapered on d/c home--5 day steroid taper Essential Hypertension-stable. Hypokalemia-replacement ordered.  recheck mag as op Chronic pain with opioid dependence-resume home pain med management regimen which has been stable per patient.          08/24/2017 1st [place] [facility] office visit/ Yocelyn Brocious   Chief Complaint  Patient presents with   Pulmonary Consult    Referred by Dr. Adam Phenix.  Pt states dxed with COPD approx 10 years ago. She states she was hospitalized in April 2019 for COPD flare and sent home with o2.  She states she gets SOB with minimal exertion such as walking from room to room at home. She uses proair 3 x per wk on average and has duoneb but has not needed it since starting o2.    MMRC3 = can't walk 100 yards even at a slow pace at a flat grade s stopping due to sob  Even on 02  Rattling cough more prod first thing in am = min mucoid  maint on symbicort 160    Off prednisone x sev weeks and about the same since stopped just on symbicort 160 2bid  And  2lpm with activity  Sleeping  2lpm and 30 degrees    rec Plan A = Automatic =   Symbicort 160  Take 2 puffs first thing in am and then another 2 puffs about 12 hours later and spiriva 2 pffs each am  Work on inhaler technique:     Plan B = Backup Only use your albuterol (Proair)as a rescue medication Plan C = Crisis - only use your albuterol nebulizer if you first try Plan B      08/05/2019  f/u ov/[place] Kienast re:  GOLD II/ symb /spiriva still smoking  - had [place] vaccine  Chief Complaint  Patient presents with   Follow-up    Breathing has been worse over the past 2 months- she is waking up in the morning feeling SOB and has to use her rescue inhaler. She is  using the rescue inhaler most days.   Dyspnea: foot slows her down> sob  Cough: minimal in am / white  Sleeping: on 02 flat bed  SABA use: not using symb first thing as rec and uses saba first  02: 2lpm hs only / wants portable system rec Plan A = Automatic = Always=    symbicort then spiriva first thing then symbicot 12 hours later  Work on inhaler technique:  Plan B = Backup (to supplement plan A, not to replace it) Only use your albuterol inhaler as a rescue medication Plan C = Crisis (instead of Plan B but only if Plan B stops working) - only use your albuterol nebulizer if you first try Plan B and it fails to help > ok to use the nebulizer up to every 4 hours but if start needing it regularly call for immediate appointment The key is to stop smoking completely before smoking completely stops you!       02/19/2020  f/u ov/[place] office/Danielle Mink re: GOLD II/ 02 dep still smoking Chief Complaint  Patient presents with   Follow-up    Here for o2 recert- c/o waking up in the night coughing and "gagging" x 3 months. Cough is occ prod with clear sputum. She is using her albuterol inhaler about once per day.   Dyspnea: foot drop limiting / around the house doe  Cough: nightly cough/ gag 4 h after hs  and continues until gets up and uses saba in am / min clear mucus  Sleeping: bed is flat with wedge  SABA use: as above/ no neb  02: 2lpm hs  On ppi with breakfast  Rec Protonix 40 mg Take 30- 60 min before your first and last meals of the day  GERD diet reviewed, bed blocks rec  We will be scheduling you ONO on room air to qualify you for 02  Please remember to go to the  x-ray department  pos SPN CTchest 03/19/20 : 1. 1.5 x 1.5 x 1.7 cm RIGHT upper lobe pulmonary nodule  > PET  04/19/20 > isolated RUL positive nodule > refer to T surgery  > RULobectomy for Sq cell ca 07/19/20      11/03/2020  f/u ov/[place] office/Lynne Righi re: GOLD II but 02 dep / maint on symbicort 160 / spiriva  and 4lpm   Chief Complaint  Patient presents with   Follow-up    4L o2 at home 24/7- In the car and out in public she keeps it at 3L o2 to reserve oxygen tanks but feels more short of breath than normal after lung surgery in May 2022 but before the surgery  it was as needed and always at night time on 2L o2. After surgery and lately she reports a cough with clear mucus.  - MR   Dyspnea:  limited by R foot drop / using rollator  Cough: still coughing thick clear mucus esp in am / using delyms  Sleeping: bed is flat/ some am congestion  SABA use: too much  02: 4lpm 24/7  Rec For cough/ congestion > mucinex dm 1200 mg every 12 hours as needed and use flutter valve  The key is to stop smoking completely before smoking completely stops you! Plan A = Automatic = Always=    symbicort 160 / spiriva 2 of each then 12 hours later 2 puffs of symbicort  Plan B = Backup (to supplement plan A, not to replace it) Only use your albuterol inhaler as a rescue medication Plan C = Crisis (instead of Plan B but only if Plan B stops working) - only use your albuterol nebulizer if you first try Plan B and it fails to help Ok to try albuterol 15 min before an activity (on alternating days)  that you know would usually make you short of breath  We will contact [facility] to do a best fit evaluation  - late add did not need amb 02 based on today's eval  Make sure you check your oxygen saturation  at your highest level of activity  to be sure it stays over 90%       07/18/2021  f/u ov/[place] office/Stockton Nunley re: GOLD 2  maint on ***  No chief complaint on file.   Dyspnea:  *** Cough: *** Sleeping: *** SABA use: *** 02: *** Covid status: *** Lung cancer screening: ***   No obvious day to day or daytime variability or assoc excess/ purulent sputum or mucus plugs or hemoptysis or cp or chest tightness, subjective wheeze or overt sinus or hb symptoms.   *** without nocturnal  or early am exacerbation  of respiratory   c/o's or need for noct saba. Also denies any obvious fluctuation of symptoms with weather or environmental changes or other aggravating or alleviating factors except as outlined above   No unusual exposure hx or h/o childhood pna/ asthma or knowledge of premature birth.  Current Allergies, Complete Past Medical History, Past Surgical History, Family History, and Social History were reviewed in [employer] record.  ROS  The following are not active complaints unless bolded Hoarseness, sore throat, dysphagia, dental problems, itching, sneezing,  nasal congestion or discharge of excess mucus or purulent secretions, ear ache,   fever, chills, sweats, unintended wt loss or wt gain, classically pleuritic or exertional cp,  orthopnea pnd or arm/hand swelling  or leg swelling, presyncope, palpitations, abdominal pain, anorexia, nausea, vomiting, diarrhea  or change in bowel habits or change in bladder habits, change in stools or change in urine, dysuria, hematuria,  rash, arthralgias, visual complaints, headache, numbness, weakness or ataxia or problems with walking or coordination,  change in mood or  memory.        No outpatient medications have been marked as taking for the 07/18/21 encounter (Appointment) with Tanda Rockers, MD.                          Objective:   Physical Exam   07/18/2021         *** 11/03/2020         92 02/19/2020  96  08/05/2019           98.6  01/09/2018        105  09/21/2017        104   08/24/17 105 lb 9.6 oz (47.9 kg)  07/31/17 106 lb (48.1 kg)  07/26/17 105 lb 3.2 oz (47.7 kg)       Mod bar***   R foot drop/ in ankle brace.           I personally reviewed images and agree with radiology impression as follows:  CXR:   07/12/21  Postsurgical change right upper lobectomy. Right basilar pleural thickening appears similar to prior chest CT. No new focal airspace consolidation.       Assessment:

## 2021-07-19 NOTE — Telephone Encounter (Signed)
Would like to get an oxygen humidifier for my home use on my oxygen concentrator to prevent dry irritation to my throat. ? Can I get a prescription sent to Medicare for approval? I use [facility] for my oxygen supplies ? ?Dr. Melvyn Novas please advise if ok to place order.  ?

## 2021-07-19 NOTE — Telephone Encounter (Signed)
Ok to order humidifier for 02  ?

## 2021-07-20 ENCOUNTER — Ambulatory Visit: Payer: Medicare Other | Admitting: Family Medicine

## 2021-07-21 ENCOUNTER — Ambulatory Visit (HOSPITAL_COMMUNITY)
Admission: RE | Admit: 2021-07-21 | Discharge: 2021-07-21 | Disposition: A | Discharge status: Home / Self Care | Payer: Medicare Other | Source: Ambulatory Visit | Attending: Family Medicine | Admitting: Family Medicine

## 2021-07-21 ENCOUNTER — Other Ambulatory Visit: Payer: Medicare Other

## 2021-07-21 ENCOUNTER — Encounter: Payer: Self-pay | Admitting: Family Medicine

## 2021-07-21 DIAGNOSIS — M25472 Effusion, left ankle: Secondary | ICD-10-CM | POA: Diagnosis present

## 2021-07-22 LAB — CBC
Hematocrit: 28.2 % — ABNORMAL LOW (ref 34.0–46.6)
Hemoglobin: 9.5 g/dL — ABNORMAL LOW (ref 11.1–15.9)
MCH: 34.2 pg — ABNORMAL HIGH (ref 26.6–33.0)
MCHC: 33.7 g/dL (ref 31.5–35.7)
MCV: 101 fL — ABNORMAL HIGH (ref 79–97)
Platelets: 329 10*3/uL (ref 150–450)
RBC: 2.78 x10E6/uL — ABNORMAL LOW (ref 3.77–5.28)
RDW: 11.8 % (ref 11.7–15.4)
WBC: 8.9 10*3/uL (ref 3.4–10.8)

## 2021-07-22 LAB — HEPATIC FUNCTION PANEL
ALT: 12 IU/L (ref 0–32)
AST: 13 IU/L (ref 0–40)
Albumin: 3 g/dL — ABNORMAL LOW (ref 3.8–4.8)
Alkaline Phosphatase: 119 IU/L (ref 44–121)
Bilirubin Total: <0.2 mg/dL (ref 0.0–1.2)
Bilirubin, Direct: <0.1 mg/dL (ref 0.00–0.40)
Total Protein: 5.7 g/dL — ABNORMAL LOW (ref 6.0–8.5)

## 2021-07-22 LAB — BASIC METABOLIC PANEL
BUN/Creatinine Ratio: 25 (ref 12–28)
BUN: 12 mg/dL (ref 8–27)
CO2: 25 mmol/L (ref 20–29)
Calcium: 8.8 mg/dL (ref 8.7–10.3)
Chloride: 101 mmol/L (ref 96–106)
Creatinine, Ser: 0.48 mg/dL — ABNORMAL LOW (ref 0.57–1.00)
Glucose: 152 mg/dL — ABNORMAL HIGH (ref 70–99)
Potassium: 4 mmol/L (ref 3.5–5.2)
Sodium: 139 mmol/L (ref 134–144)
eGFR: 104 mL/min/{1.73_m2} (ref >59)

## 2021-07-29 NOTE — Progress Notes (Deleted)
GI Office Note    Referring Provider: Janora Norlander, DO Primary Care Physician:  Janora Norlander, DO Primary GI: Dr. Gala Romney  Date:  08/02/2021  ID:  Jasmine [name], DOB [birth year], MRN [id number]   Chief Complaint   No chief complaint on file.    History of Present Illness  Jasmine [name] is a 66 y.o. 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 She is presenting today for hospital follow up.   Last EGD and Colonoscopy 06/14/20.  EGD with normal esophagus, erosive gastropathy predominantly antrum (hyperemia and mild reactive changes), marked erythema, patent pylorus, normal duodenum.  Colonoscopy with four 69 mm polyps at splenic flexure and hepatic flexure, single 20 mm polyp in the hepatic flexure, otherwise normal, pathology consistent with tubular adenomas.  Admitted to [facility] 06/28/21 with recurrent nausea and vomiting and diarrhea. She had RUQ U/S showing gallbladder wall thickening, no gallstones, CBD measuring 13 mm.  It was suspected that she presented with pancreatitis of unclear etiology, denies history of heavy alcohol use.  Most recent imaging in January 2023 with calcifications in the pancreatic head and uncinate process, 1 calcification in the region of the ampulla.  She had prior imaging which also showed pancreatic ductal dilation up to 8 mm in the pancreatic head.  She was suggested to have EUS back in 2020 and 2022 for what she did not have completed due to various other medical issues.  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  She had had significant weight loss in the past.  Had denied any diarrhea.  She was unable to have MRI due to  spinal cord stimulator in place.  LFTs were normal, alk phos mildly elevated on admission but improved to WNL.  Suspect that her nausea and vomiting was related to acute on chronic pancreatitis, gastroparesis, or other biliary etiology in the setting of chronic opioid use has had prior EGD in April 2022 with normal esophagus, erosive gastropathy predominantly in the antrum, normal duodenum.  She had multiple hospital admissions for upper abdominal pain and intractable nausea vomiting.  She had reported a lack of appetite for quite some time, taking boost shakes at home.  She was started on Creon 48,000 units with meals and 24,000 units with snacks and had improvement in symptoms within 48 hours.  Received note from her PCP, most recent lab work completed from 5/18 with Hgb 9.5, down from 10.6 at discharge.  Macrocytic indices, normal platelets, low protein and albumin, normal LFTs.   Today:  Anemia -   Nausea/Vomiting -   Chronic pancreatitis -     Past Medical History:  Diagnosis Date   Allergy    Anemia    Anxiety    Arthritis    Asthma    Carpal tunnel syndrome    Chronic back pain    Cigarette smoker 2/92/4462   Complication of anesthesia    in the past has had N/V, the last few surgeries she has not been   COPD (chronic obstructive pulmonary disease) ([place])    Easy bruising 07/10/2016   GERD (gastroesophageal reflux disease)    Headache    Heart murmur 2005   never had any problems   History of blood transfusion 2018  History of kidney stones    Hypertension    Hypoglycemia    Hypoglycemia    Iron deficiency anemia 07/11/2016   Neuropathy    both arms   Normocytic anemia 07/29/2015   Pancreatitis    Pneumonia    PONV (postoperative nausea and vomiting)    Postoperative anemia due to acute blood loss 11/15/2015   R lung cancer    Sciatica    Seizures ([place])    as a child when she would have an asthma attack. none as an adult   Shortness of breath dyspnea     Past  Surgical History:  Procedure Laterality Date   ABDOMINAL HYSTERECTOMY  9924   APPLICATION OF ROBOTIC ASSISTANCE FOR SPINAL PROCEDURE N/A 09/05/2016   Procedure: APPLICATION OF ROBOTIC ASSISTANCE FOR SPINAL PROCEDURE;  Surgeon: Consuella Lose, MD;  Location: [place];  Service: Neurosurgery;  Laterality: N/A;   BACK SURGERY     BIOPSY  06/14/2020   Procedure: BIOPSY;  Surgeon: Daneil Dolin, MD;  [facility];  Service: Endoscopy;;   [place]   COLONOSCOPY     unsure last   COLONOSCOPY WITH PROPOFOL N/A 06/14/2020   Procedure: COLONOSCOPY WITH PROPOFOL;  Surgeon: Daneil Dolin, MD;  [facility];  Service: Endoscopy;  Laterality: N/A;  PM   cyst removal face     disk repair     neck and lumbar region   ESOPHAGOGASTRODUODENOSCOPY (EGD) WITH PROPOFOL N/A 06/14/2020   Procedure: ESOPHAGOGASTRODUODENOSCOPY (EGD) WITH PROPOFOL;  Surgeon: Daneil Dolin, MD;  [facility];  Service: Endoscopy;  Laterality: N/A;   FOOT SURGERY Bilateral    to file bones down    HARDWARE REMOVAL N/A 09/05/2016   Procedure: HARDWARE REMOVAL AND REPLACEMENT OF LUMBAR FIVE SCREWS;  Surgeon: Consuella Lose, MD;  Location: [place];  Service: Neurosurgery;  Laterality: N/A;   IMPLANTATION / PLACEMENT EPIDURAL NEUROSTIMULATOR ELECTRODES     INTERCOSTAL NERVE BLOCK Right 07/29/2020   Procedure: INTERCOSTAL NERVE BLOCK;  Surgeon: Melrose Nakayama, MD;  Location: [place];  Service: Thoracic;  Laterality: Right;   LAMINECTOMY     2006   lobectomy Right 07/2020   LUMBAR FUSION     LYMPH NODE BIOPSY Right 07/29/2020   Procedure: LYMPH NODE BIOPSY;  Surgeon: Melrose Nakayama, MD;  Location: [place];  Service: Thoracic;  Laterality: Right;   POLYPECTOMY  06/14/2020   Procedure: POLYPECTOMY INTESTINAL;  Surgeon: Daneil Dolin, MD;  [facility];  Service: Endoscopy;;   SPINAL CORD STIMULATOR BATTERY EXCHANGE     SPINAL CORD  STIMULATOR INSERTION N/A 07/05/2016   Procedure: REPLACEMENT OF LUMBAR SPINAL CORD STIMULATOR BATTERY;  Surgeon: Newman Pies, MD;  Location: [place];  Service: Neurosurgery;  Laterality: N/A;   TONSILLECTOMY      Current Outpatient Medications  Medication Sig Dispense Refill   albuterol (VENTOLIN HFA) 108 (90 Base) MCG/ACT inhaler USE 2 INHALATIONS EVERY 6 HOURS AS NEEDED FOR WHEEZING OR SHORTNESS OF BREATH (NEED TO BE SEEN BEFORE NEXT REFILL) 25.5 g 3   amLODipine (NORVASC) 10 MG tablet Take 1 tablet (10 mg total) by mouth daily. 30 tablet 1   atorvastatin (LIPITOR) 40 MG tablet Take 1 tablet (40 mg total) by mouth daily. 90 tablet 3   budesonide-formoterol (SYMBICORT) 160-4.5 MCG/ACT inhaler USE 2 INHALATIONS TWICE A DAY 20.4 g 5   Cholecalciferol 125 MCG (5000 UT) capsule Take 5,000 Units  by mouth daily.     dextromethorphan-guaiFENesin (MUCINEX DM) 30-600 MG 12hr tablet Take 1 tablet by mouth 2 (two) times daily.     DULoxetine (CYMBALTA) 30 MG capsule Take 3 capsules (90 mg total) by mouth daily. 270 capsule 4   Iron Polysacch Cmplx-B12-FA (POLY-IRON 150 FORTE) 150-0.025-1 MG CAPS Take 1 capsule by mouth daily. 60 capsule 2   L-Methylfolate-Algae-B12-B6 (METANX) 3-90.314-2-35 MG CAPS Take 1 tablet by mouth daily as needed.     lipase/protease/amylase 24000-76000 units CPEP Take 2 caps daily with meals and 1 cap with snacks 270 capsule 2   magic mouthwash w/lidocaine SOLN Gargle and spit 23m every 6 hours as needed for sore throat. 639mnystatin, 6065mydrocortisone tab, qs benadryl total 480m81m80 mL 0   magnesium oxide (MAG-OX) 400 (241.3 Mg) MG tablet Take 400 mg by mouth 2 (two) times daily.     metoprolol tartrate (LOPRESSOR) 25 MG tablet Take 0.5 tablets (12.5 mg total) by mouth 2 (two) times daily. 30 tablet 1   mirtazapine (REMERON) 7.5 MG tablet Take 1 tablet (7.5 mg total) by mouth at bedtime. Cross taper with 30mg24mCymbalta for 3-4 weeks. Then stop cymbalta 30 tablet 1    nicotine (NICODERM CQ) 14 mg/24hr patch Place 1 patch (14 mg total) onto the skin daily. 30 patch 0   Nutritional Supplements (ENSURE HIGH PROTEIN) LIQD Drink 1 can 3 times daily. 21330 mL PRN   omeprazole (PRILOSEC) 40 MG capsule Take one capsule once to twice daily before a meal. 90 capsule 3   ondansetron (ZOFRAN ODT) 4 MG disintegrating tablet Take 1 tablet (4 mg total) by mouth every 8 (eight) hours as needed for nausea or vomiting. (Patient not taking: Reported on 07/12/2021) 20 tablet 0   oxybutynin (DITROPAN-XL) 5 MG 24 hr tablet TAKE 1 TABLET(5 MG) BY MOUTH AT BEDTIME 90 tablet 0   oxyCODONE-acetaminophen (PERCOCET) 10-325 MG tablet Take 1 tablet by mouth 5 (five) times daily.     OXYCONTIN 15 MG 12 hr tablet Take 15 mg by mouth every 12 (twelve) hours.     OXYGEN Inhale 4 L into the lungs See admin instructions. continuous     polyethylene glycol (MIRALAX) 17 g packet Take 17 g by mouth daily. 28 each 2   promethazine (PHENERGAN) 25 MG suppository Place 1 suppository (25 mg total) rectally every 6 (six) hours as needed for nausea or vomiting. 12 each 1   SPIRIVA RESPIMAT 2.5 MCG/ACT AERS USE 2 INHALATIONS DAILY (Patient taking differently: Inhale 2 puffs into the lungs daily.) 12 g 3   tizanidine (ZANAFLEX) 6 MG capsule Take 1 capsule (6 mg total) by mouth 3 (three) times daily as needed for muscle spasms.     traZODone (DESYREL) 150 MG tablet TAKE 1 TO 2 TABLETS AT BEDTIME 180 tablet 3   No current facility-administered medications for this visit.    Allergies as of 08/02/2021 - Review Complete 07/12/2021  Allergen Reaction Noted   Lyrica [pregabalin] Other (See Comments) 06/29/2016   Darvon [propoxyphene]  02/22/2015   Gabapentin  08/31/2016   Augmentin [amoxicillin-pot clavulanate] Nausea And Vomiting 03/19/2015   Erythromycin Itching and Rash 02/22/2015   Vibramycin [doxycycline calcium] Itching and Rash 02/22/2015    Family History  Problem Relation Age of Onset   Heart  disease Mother    Diabetes Mother    Hypertension Mother    Hyperlipidemia Mother    COPD Mother        smoked  Cancer Mother        bile duct   COPD Father    Diabetes Father    Heart disease Father    Cancer Father        throat   Dementia Father    Asthma Son    Hypertension Sister    Hyperlipidemia Sister    Hypertension Brother    Hyperlipidemia Brother    Hypertension Brother    Hyperlipidemia Brother    Emphysema Maternal Grandmother        smoked   Colon cancer Neg Hx    Colon polyps Neg Hx     Social History   Socioeconomic History   Marital status: Married    Spouse name: [name]   Number of children: 1   Years of education: Not on file   Highest education level: Some college, no degree  Occupational History   Occupation: disability  Tobacco Use   Smoking status: Every Day    Packs/day: 0.25    Years: 35.00    Pack years: 8.75    Types: Cigarettes   Smokeless tobacco: Never   Tobacco comments:    trying to quit  Vaping Use   Vaping Use: Never used  Substance and Sexual Activity   Alcohol use: Yes    Alcohol/week: 2.0 standard drinks    Types: 2 Glasses of wine per week    Comment: occasionally   Drug use: No   Sexual activity: Yes    Birth control/protection: None  Other Topics Concern   Not on file  Social History Narrative   Retired, 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 1 son, deceased. 2 pet dogs. Enjoys watching TV.   Husband takes care of cooking, cleaning, driving, and helps her with ADLs including bathing   Social Determinants of Health   Financial Resource Strain: Low Risk    Difficulty of Paying Living Expenses: Not very hard  Food Insecurity: No Food Insecurity   Worried About Charity fundraiser in the Last Year: Never true   Ran Out of Food in the Last Year: Never true  Transportation Needs: No Transportation Needs   Lack of Transportation (Medical): No   Lack of Transportation (Non-Medical): No  Physical  Activity: Insufficiently Active   Days of Exercise per Week: 7 days   Minutes of Exercise per Session: 10 min  Stress: No Stress Concern Present   Feeling of Stress : Not at all  Social Connections: Socially Integrated   Frequency of Communication with Friends and Family: More than three times a week   Frequency of Social Gatherings with Friends and Family: More than three times a week   Attends Religious Services: 1 to 4 times per year   Active Member of [employer] or Organizations: Yes   Attends Archivist Meetings: 1 to 4 times per year   Marital Status: Married     Review of Systems   Gen: Denies fever, chills, anorexia. Denies fatigue, weakness, weight loss.  CV: Denies chest pain, palpitations, syncope, peripheral edema, and claudication. Resp: Denies dyspnea at rest, cough, wheezing, coughing up blood, and pleurisy. GI: see HPI Derm: Denies rash, itching, dry skin Psych: Denies depression, anxiety, memory loss, confusion. No homicidal or suicidal ideation.  Heme: Denies bruising, bleeding, and enlarged lymph nodes.   Physical Exam   There were no vitals taken for this visit.  General:   Alert and oriented. No distress noted. Pleasant and cooperative.  Head:  Normocephalic and atraumatic. Eyes:  Conjuctiva clear without scleral icterus. Mouth:  Oral mucosa pink and moist. Good dentition. No lesions. Lungs:  Clear to auscultation bilaterally. No wheezes, rales, or rhonchi. No distress.  Heart:  S1, S2 present without murmurs appreciated.  Abdomen:  +BS, soft, non-tender and non-distended. No rebound or guarding. No HSM or masses noted. Rectal: *** Msk:  Symmetrical without gross deformities. Normal posture. Extremities:  Without edema. Neurologic:  Alert and  oriented x4 Psych:  Alert and cooperative. Normal mood and affect.   Assessment  Jasmine [name] is a 66 y.o. female with a history of COPD, chronic pancreatitis, chronically dilated CBD, h/o C. 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  Chronic pancreatitis/Dilated CBD:   PLAN   *** Referral to [place] for EUS to further evaluate dilated CBD and dilated pancreatic duct.  Continue Creon 48,000 units with meals and 24,000 units with snacks Continue omeprazole 40 mg daily  Continue low fat diet.    Venetia Night, MSN, FNP-BC, AGACNP-BC [facility]

## 2021-08-02 ENCOUNTER — Inpatient Hospital Stay: Payer: Medicare Other | Admitting: Gastroenterology

## 2021-08-02 ENCOUNTER — Encounter: Payer: Self-pay | Admitting: Gastroenterology

## 2021-08-03 ENCOUNTER — Other Ambulatory Visit: Payer: Self-pay

## 2021-08-03 ENCOUNTER — Ambulatory Visit (HOSPITAL_COMMUNITY): Payer: Medicare Other | Admitting: Hematology

## 2021-08-03 ENCOUNTER — Emergency Department (HOSPITAL_COMMUNITY): Payer: Medicare Other

## 2021-08-03 ENCOUNTER — Encounter (HOSPITAL_COMMUNITY): Payer: Self-pay

## 2021-08-03 ENCOUNTER — Inpatient Hospital Stay (HOSPITAL_COMMUNITY)
Admission: EM | Admit: 2021-08-03 | Discharge: 2021-08-07 | DRG: 194 | Disposition: A | Discharge status: Home Health | Payer: Medicare Other | Attending: Family Medicine | Admitting: Family Medicine

## 2021-08-03 DIAGNOSIS — D509 Iron deficiency anemia, unspecified: Secondary | ICD-10-CM | POA: Diagnosis present

## 2021-08-03 DIAGNOSIS — Z9981 Dependence on supplemental oxygen: Secondary | ICD-10-CM

## 2021-08-03 DIAGNOSIS — F1120 Opioid dependence, uncomplicated: Secondary | ICD-10-CM | POA: Diagnosis present

## 2021-08-03 DIAGNOSIS — Z881 Allergy status to other antibiotic agents status: Secondary | ICD-10-CM

## 2021-08-03 DIAGNOSIS — F419 Anxiety disorder, unspecified: Secondary | ICD-10-CM | POA: Diagnosis present

## 2021-08-03 DIAGNOSIS — G5693 Unspecified mononeuropathy of bilateral upper limbs: Secondary | ICD-10-CM | POA: Diagnosis present

## 2021-08-03 DIAGNOSIS — R079 Chest pain, unspecified: Secondary | ICD-10-CM | POA: Diagnosis not present

## 2021-08-03 DIAGNOSIS — Z90710 Acquired absence of both cervix and uterus: Secondary | ICD-10-CM

## 2021-08-03 DIAGNOSIS — R109 Unspecified abdominal pain: Secondary | ICD-10-CM | POA: Diagnosis present

## 2021-08-03 DIAGNOSIS — D539 Nutritional anemia, unspecified: Secondary | ICD-10-CM | POA: Diagnosis present

## 2021-08-03 DIAGNOSIS — Z885 Allergy status to narcotic agent status: Secondary | ICD-10-CM

## 2021-08-03 DIAGNOSIS — J189 Pneumonia, unspecified organism: Secondary | ICD-10-CM | POA: Diagnosis not present

## 2021-08-03 DIAGNOSIS — Z888 Allergy status to other drugs, medicaments and biological substances status: Secondary | ICD-10-CM

## 2021-08-03 DIAGNOSIS — K219 Gastro-esophageal reflux disease without esophagitis: Secondary | ICD-10-CM | POA: Diagnosis present

## 2021-08-03 DIAGNOSIS — Z87442 Personal history of urinary calculi: Secondary | ICD-10-CM

## 2021-08-03 DIAGNOSIS — E441 Mild protein-calorie malnutrition: Secondary | ICD-10-CM | POA: Diagnosis present

## 2021-08-03 DIAGNOSIS — Z681 Body mass index (BMI) 19 or less, adult: Secondary | ICD-10-CM

## 2021-08-03 DIAGNOSIS — R7989 Other specified abnormal findings of blood chemistry: Secondary | ICD-10-CM

## 2021-08-03 DIAGNOSIS — I10 Essential (primary) hypertension: Secondary | ICD-10-CM | POA: Diagnosis present

## 2021-08-03 DIAGNOSIS — F1190 Opioid use, unspecified, uncomplicated: Secondary | ICD-10-CM | POA: Diagnosis present

## 2021-08-03 DIAGNOSIS — I2510 Atherosclerotic heart disease of native coronary artery without angina pectoris: Secondary | ICD-10-CM | POA: Diagnosis present

## 2021-08-03 DIAGNOSIS — R1110 Vomiting, unspecified: Secondary | ICD-10-CM

## 2021-08-03 DIAGNOSIS — R778 Other specified abnormalities of plasma proteins: Secondary | ICD-10-CM | POA: Diagnosis present

## 2021-08-03 DIAGNOSIS — M5431 Sciatica, right side: Secondary | ICD-10-CM | POA: Diagnosis present

## 2021-08-03 DIAGNOSIS — Z20822 Contact with and (suspected) exposure to covid-19: Secondary | ICD-10-CM | POA: Diagnosis present

## 2021-08-03 DIAGNOSIS — K2970 Gastritis, unspecified, without bleeding: Secondary | ICD-10-CM | POA: Diagnosis present

## 2021-08-03 DIAGNOSIS — G8929 Other chronic pain: Secondary | ICD-10-CM | POA: Diagnosis present

## 2021-08-03 DIAGNOSIS — K8689 Other specified diseases of pancreas: Secondary | ICD-10-CM | POA: Diagnosis present

## 2021-08-03 DIAGNOSIS — R112 Nausea with vomiting, unspecified: Secondary | ICD-10-CM | POA: Diagnosis not present

## 2021-08-03 DIAGNOSIS — Z880 Allergy status to penicillin: Secondary | ICD-10-CM

## 2021-08-03 DIAGNOSIS — Z85118 Personal history of other malignant neoplasm of bronchus and lung: Secondary | ICD-10-CM

## 2021-08-03 DIAGNOSIS — M25551 Pain in right hip: Secondary | ICD-10-CM | POA: Diagnosis present

## 2021-08-03 DIAGNOSIS — N281 Cyst of kidney, acquired: Secondary | ICD-10-CM | POA: Diagnosis present

## 2021-08-03 DIAGNOSIS — J449 Chronic obstructive pulmonary disease, unspecified: Secondary | ICD-10-CM | POA: Diagnosis present

## 2021-08-03 DIAGNOSIS — Z902 Acquired absence of lung [part of]: Secondary | ICD-10-CM

## 2021-08-03 DIAGNOSIS — Z79899 Other long term (current) drug therapy: Secondary | ICD-10-CM

## 2021-08-03 DIAGNOSIS — J42 Unspecified chronic bronchitis: Secondary | ICD-10-CM

## 2021-08-03 DIAGNOSIS — J9611 Chronic respiratory failure with hypoxia: Secondary | ICD-10-CM | POA: Diagnosis present

## 2021-08-03 DIAGNOSIS — Z7951 Long term (current) use of inhaled steroids: Secondary | ICD-10-CM

## 2021-08-03 DIAGNOSIS — F17210 Nicotine dependence, cigarettes, uncomplicated: Secondary | ICD-10-CM | POA: Diagnosis present

## 2021-08-03 DIAGNOSIS — E876 Hypokalemia: Secondary | ICD-10-CM | POA: Diagnosis present

## 2021-08-03 DIAGNOSIS — G4700 Insomnia, unspecified: Secondary | ICD-10-CM | POA: Diagnosis present

## 2021-08-03 DIAGNOSIS — J440 Chronic obstructive pulmonary disease with acute lower respiratory infection: Secondary | ICD-10-CM | POA: Diagnosis present

## 2021-08-03 DIAGNOSIS — Y95 Nosocomial condition: Secondary | ICD-10-CM | POA: Diagnosis present

## 2021-08-03 LAB — CBC WITH DIFFERENTIAL/PLATELET
Abs Immature Granulocytes: 0.03 K/uL (ref 0.00–0.07)
Basophils Absolute: 0 K/uL (ref 0.0–0.1)
Basophils Relative: 1 %
Eosinophils Absolute: 0 K/uL (ref 0.0–0.5)
Eosinophils Relative: 0 %
HCT: 37 % (ref 36.0–46.0)
Hemoglobin: 11.3 g/dL — ABNORMAL LOW (ref 12.0–15.0)
Immature Granulocytes: 0 %
Lymphocytes Relative: 9 %
Lymphs Abs: 0.7 K/uL (ref 0.7–4.0)
MCH: 34.2 pg — ABNORMAL HIGH (ref 26.0–34.0)
MCHC: 30.5 g/dL (ref 30.0–36.0)
MCV: 112.1 fL — ABNORMAL HIGH (ref 80.0–100.0)
Monocytes Absolute: 0.4 K/uL (ref 0.1–1.0)
Monocytes Relative: 6 %
Neutro Abs: 6.3 K/uL (ref 1.7–7.7)
Neutrophils Relative %: 84 %
Platelets: 376 K/uL (ref 150–400)
RBC: 3.3 MIL/uL — ABNORMAL LOW (ref 3.87–5.11)
RDW: 14.3 % (ref 11.5–15.5)
WBC: 7.4 K/uL (ref 4.0–10.5)
nRBC: 0 % (ref 0.0–0.2)

## 2021-08-03 LAB — TROPONIN I (HIGH SENSITIVITY)
Troponin I (High Sensitivity): 46 ng/L — ABNORMAL HIGH
Troponin I (High Sensitivity): 53 ng/L — ABNORMAL HIGH (ref <18)

## 2021-08-03 LAB — COMPREHENSIVE METABOLIC PANEL
ALT: 15 U/L (ref 0–44)
AST: 23 U/L (ref 15–41)
Albumin: 3.1 g/dL — ABNORMAL LOW (ref 3.5–5.0)
Alkaline Phosphatase: 122 U/L (ref 38–126)
Anion gap: 13 (ref 5–15)
BUN: 9 mg/dL (ref 8–23)
CO2: 23 mmol/L (ref 22–32)
Calcium: 8.9 mg/dL (ref 8.9–10.3)
Chloride: 98 mmol/L (ref 98–111)
Creatinine, Ser: 0.56 mg/dL (ref 0.44–1.00)
GFR, Estimated: >60 mL/min (ref >60)
Glucose, Bld: 106 mg/dL — ABNORMAL HIGH (ref 70–99)
Potassium: 3 mmol/L — ABNORMAL LOW (ref 3.5–5.1)
Sodium: 134 mmol/L — ABNORMAL LOW (ref 135–145)
Total Bilirubin: 1.2 mg/dL (ref 0.3–1.2)
Total Protein: 7.2 g/dL (ref 6.5–8.1)

## 2021-08-03 LAB — MAGNESIUM
Magnesium: 1.6 mg/dL — ABNORMAL LOW (ref 1.7–2.4)
Magnesium: 1.5 mg/dL — ABNORMAL LOW (ref 1.7–2.4)

## 2021-08-03 LAB — ETHANOL: Alcohol, Ethyl (B): <10 mg/dL

## 2021-08-03 LAB — RESP PANEL BY RT-PCR (FLU A&B, COVID) ARPGX2
Influenza A by PCR: NEGATIVE
Influenza B by PCR: NEGATIVE
SARS Coronavirus 2 by RT PCR: NEGATIVE

## 2021-08-03 LAB — RAPID URINE DRUG SCREEN, HOSP PERFORMED
Amphetamines: NOT DETECTED
Barbiturates: NOT DETECTED
Benzodiazepines: NOT DETECTED
Cocaine: NOT DETECTED
Opiates: POSITIVE — AB
Tetrahydrocannabinol: NOT DETECTED

## 2021-08-03 LAB — LACTIC ACID, PLASMA: Lactic Acid, Venous: 0.6 mmol/L (ref 0.5–1.9)

## 2021-08-03 LAB — PROTIME-INR
INR: 0.9 (ref 0.8–1.2)
Prothrombin Time: 11.6 seconds (ref 11.4–15.2)

## 2021-08-03 LAB — LIPASE, BLOOD: Lipase: 18 U/L (ref 11–51)

## 2021-08-03 MED ORDER — POTASSIUM CHLORIDE IN NACL 40-0.9 MEQ/L-% IV SOLN
INTRAVENOUS | Status: AC
  Filled 2021-08-03 (×2): qty 1000

## 2021-08-03 MED ORDER — ONDANSETRON HCL 4 MG PO TABS
4.0000 mg | ORAL_TABLET | Freq: Four times a day (QID) | ORAL | Status: DC | PRN
  Filled 2021-08-03: qty 1

## 2021-08-03 MED ORDER — POLYETHYLENE GLYCOL 3350 17 G PO PACK: 17.0000 g | PACK | Freq: Every day | ORAL | Status: DC | PRN

## 2021-08-03 MED ORDER — IPRATROPIUM-ALBUTEROL 0.5-2.5 (3) MG/3ML IN SOLN: 3.0000 mL | RESPIRATORY_TRACT | Status: DC | PRN

## 2021-08-03 MED ORDER — VANCOMYCIN HCL 500 MG/100ML IV SOLN: 500.0000 mg | INTRAVENOUS | Status: DC

## 2021-08-03 MED ORDER — ACETAMINOPHEN 650 MG RE SUPP: 650.0000 mg | Freq: Four times a day (QID) | RECTAL | Status: DC | PRN

## 2021-08-03 MED ORDER — MAGNESIUM OXIDE -MG SUPPLEMENT 400 (240 MG) MG PO TABS
400.0000 mg | ORAL_TABLET | Freq: Once | ORAL | Status: AC
  Administered 2021-08-03: 400 mg via ORAL
  Filled 2021-08-03: qty 1

## 2021-08-03 MED ORDER — AZITHROMYCIN 500 MG IV SOLR
500.0000 mg | Freq: Every day | INTRAVENOUS | Status: DC
  Administered 2021-08-04 – 2021-08-06 (×4): 500 mg via INTRAVENOUS
  Filled 2021-08-03 (×4): qty 5

## 2021-08-03 MED ORDER — SODIUM CHLORIDE 0.9 % IV BOLUS
1000.0000 mL | Freq: Once | INTRAVENOUS | Status: AC
  Administered 2021-08-03: 1000 mL via INTRAVENOUS

## 2021-08-03 MED ORDER — IOHEXOL 350 MG/ML SOLN
100.0000 mL | Freq: Once | INTRAVENOUS | Status: AC | PRN
  Administered 2021-08-03: 75 mL via INTRAVENOUS

## 2021-08-03 MED ORDER — POTASSIUM CHLORIDE CRYS ER 20 MEQ PO TBCR
40.0000 meq | EXTENDED_RELEASE_TABLET | Freq: Once | ORAL | Status: AC
  Administered 2021-08-03: 40 meq via ORAL
  Filled 2021-08-03: qty 2

## 2021-08-03 MED ORDER — ONDANSETRON HCL 4 MG/2ML IJ SOLN
4.0000 mg | Freq: Once | INTRAMUSCULAR | Status: AC
  Administered 2021-08-03: 4 mg via INTRAVENOUS
  Filled 2021-08-03: qty 2

## 2021-08-03 MED ORDER — AMLODIPINE BESYLATE 5 MG PO TABS
10.0000 mg | ORAL_TABLET | Freq: Every day | ORAL | Status: DC
  Administered 2021-08-03 – 2021-08-07 (×5): 10 mg via ORAL
  Filled 2021-08-03 (×7): qty 2

## 2021-08-03 MED ORDER — DULOXETINE HCL 30 MG PO CPEP
30.0000 mg | ORAL_CAPSULE | Freq: Every day | ORAL | Status: DC
Start: 2021-08-03 — End: 2021-08-07
  Administered 2021-08-03 – 2021-08-06 (×4): 30 mg via ORAL
  Filled 2021-08-03 (×4): qty 1

## 2021-08-03 MED ORDER — HYDROMORPHONE HCL 1 MG/ML IJ SOLN
0.5000 mg | Freq: Once | INTRAMUSCULAR | Status: AC
  Administered 2021-08-03: 0.5 mg via INTRAVENOUS
  Filled 2021-08-03: qty 0.5

## 2021-08-03 MED ORDER — TRAZODONE HCL 50 MG PO TABS
150.0000 mg | ORAL_TABLET | Freq: Every day | ORAL | Status: DC
  Administered 2021-08-04 – 2021-08-06 (×4): 150 mg via ORAL
  Filled 2021-08-03 (×4): qty 3

## 2021-08-03 MED ORDER — MAGNESIUM SULFATE 2 GM/50ML IV SOLN
2.0000 g | Freq: Once | INTRAVENOUS | Status: AC
  Administered 2021-08-03: 2 g via INTRAVENOUS
  Filled 2021-08-03: qty 50

## 2021-08-03 MED ORDER — ACETAMINOPHEN 325 MG PO TABS: 650.0000 mg | ORAL_TABLET | Freq: Four times a day (QID) | ORAL | Status: DC | PRN

## 2021-08-03 MED ORDER — ENSURE MAX PROTEIN PO LIQD
1.0000 | Freq: Three times a day (TID) | ORAL | Status: DC
  Administered 2021-08-03 – 2021-08-04 (×3): 330 mL via ORAL
  Administered 2021-08-04 – 2021-08-05 (×2): 237 mL via ORAL
  Administered 2021-08-05 – 2021-08-06 (×4): 330 mL via ORAL
  Filled 2021-08-03 (×2): qty 330

## 2021-08-03 MED ORDER — MIRTAZAPINE 15 MG PO TABS
7.5000 mg | ORAL_TABLET | Freq: Every day | ORAL | Status: DC
Start: 2021-08-03 — End: 2021-08-07
  Administered 2021-08-04 – 2021-08-06 (×4): 7.5 mg via ORAL
  Filled 2021-08-03 (×4): qty 1

## 2021-08-03 MED ORDER — HEPARIN (PORCINE) 25000 UT/250ML-% IV SOLN
450.0000 [IU]/h | INTRAVENOUS | Status: DC
  Administered 2021-08-03: 450 [IU]/h via INTRAVENOUS
  Filled 2021-08-03: qty 250

## 2021-08-03 MED ORDER — DM-GUAIFENESIN ER 30-600 MG PO TB12
1.0000 | ORAL_TABLET | Freq: Two times a day (BID) | ORAL | Status: DC
  Administered 2021-08-03 – 2021-08-07 (×8): 1 via ORAL
  Filled 2021-08-03 (×9): qty 1

## 2021-08-03 MED ORDER — FAMOTIDINE IN NACL 20-0.9 MG/50ML-% IV SOLN
20.0000 mg | Freq: Once | INTRAVENOUS | Status: AC
Start: 2021-08-03 — End: 2021-08-03
  Administered 2021-08-03: 20 mg via INTRAVENOUS
  Filled 2021-08-03: qty 50

## 2021-08-03 MED ORDER — HYDROMORPHONE HCL 1 MG/ML IJ SOLN
1.0000 mg | Freq: Once | INTRAMUSCULAR | Status: AC
  Administered 2021-08-03: 1 mg via INTRAVENOUS
  Filled 2021-08-03: qty 1

## 2021-08-03 MED ORDER — VANCOMYCIN HCL 750 MG/150ML IV SOLN
750.0000 mg | Freq: Once | INTRAVENOUS | Status: AC
Start: 2021-08-03 — End: 2021-08-03
  Administered 2021-08-03: 750 mg via INTRAVENOUS
  Filled 2021-08-03 (×2): qty 150

## 2021-08-03 MED ORDER — TIOTROPIUM BROMIDE MONOHYDRATE 2.5 MCG/ACT IN AERS
2.0000 | INHALATION_SPRAY | Freq: Every day | RESPIRATORY_TRACT | Status: DC
Start: 2021-08-04 — End: 2021-08-03

## 2021-08-03 MED ORDER — OXYCODONE-ACETAMINOPHEN 5-325 MG PO TABS
1.0000 | ORAL_TABLET | Freq: Four times a day (QID) | ORAL | Status: DC | PRN
  Administered 2021-08-03 – 2021-08-07 (×12): 1 via ORAL
  Filled 2021-08-03 (×13): qty 1

## 2021-08-03 MED ORDER — PANCRELIPASE (LIP-PROT-AMYL) 12000-38000 UNITS PO CPEP
24000.0000 [IU] | ORAL_CAPSULE | Freq: Three times a day (TID) | ORAL | Status: DC
  Administered 2021-08-04 – 2021-08-07 (×11): 24000 [IU] via ORAL
  Filled 2021-08-03 (×11): qty 2

## 2021-08-03 MED ORDER — UMECLIDINIUM BROMIDE 62.5 MCG/ACT IN AEPB
1.0000 | INHALATION_SPRAY | Freq: Every day | RESPIRATORY_TRACT | Status: DC
  Administered 2021-08-04 – 2021-08-07 (×3): 1 via RESPIRATORY_TRACT
  Filled 2021-08-03: qty 7

## 2021-08-03 MED ORDER — MOMETASONE FURO-FORMOTEROL FUM 200-5 MCG/ACT IN AERO
2.0000 | INHALATION_SPRAY | Freq: Two times a day (BID) | RESPIRATORY_TRACT | Status: DC
  Administered 2021-08-04 – 2021-08-07 (×6): 2 via RESPIRATORY_TRACT
  Filled 2021-08-03: qty 8.8

## 2021-08-03 MED ORDER — OXYCODONE HCL ER 15 MG PO T12A
15.0000 mg | EXTENDED_RELEASE_TABLET | Freq: Two times a day (BID) | ORAL | Status: DC
  Administered 2021-08-03 – 2021-08-05 (×5): 15 mg via ORAL
  Filled 2021-08-03 (×7): qty 1

## 2021-08-03 MED ORDER — CEFTRIAXONE SODIUM 2 G IJ SOLR
2.0000 g | INTRAMUSCULAR | Status: DC
  Administered 2021-08-04 – 2021-08-07 (×4): 2 g via INTRAVENOUS
  Filled 2021-08-03 (×4): qty 20

## 2021-08-03 MED ORDER — SODIUM CHLORIDE 0.9 % IV SOLN
1.0000 g | Freq: Once | INTRAVENOUS | Status: AC
  Administered 2021-08-03: 1 g via INTRAVENOUS
  Filled 2021-08-03 (×2): qty 10

## 2021-08-03 MED ORDER — HEPARIN BOLUS VIA INFUSION
2200.0000 [IU] | Freq: Once | INTRAVENOUS | Status: AC
  Administered 2021-08-03: 2200 [IU] via INTRAVENOUS

## 2021-08-03 MED ORDER — DIPHENHYDRAMINE HCL 25 MG PO CAPS: 25.0000 mg | ORAL_CAPSULE | Freq: Four times a day (QID) | ORAL | Status: DC | PRN

## 2021-08-03 MED ORDER — OXYCODONE-ACETAMINOPHEN 10-325 MG PO TABS: 1.0000 | ORAL_TABLET | Freq: Four times a day (QID) | ORAL | Status: DC | PRN

## 2021-08-03 MED ORDER — NITROGLYCERIN 0.4 MG SL SUBL
0.4000 mg | SUBLINGUAL_TABLET | Freq: Once | SUBLINGUAL | Status: AC
  Administered 2021-08-03 (×3): 0.4 mg via SUBLINGUAL
  Filled 2021-08-03: qty 1

## 2021-08-03 MED ORDER — ALUM & MAG HYDROXIDE-SIMETH 200-200-20 MG/5ML PO SUSP
30.0000 mL | Freq: Once | ORAL | Status: AC
  Administered 2021-08-03: 30 mL via ORAL
  Filled 2021-08-03: qty 30

## 2021-08-03 MED ORDER — METOPROLOL TARTRATE 25 MG PO TABS
12.5000 mg | ORAL_TABLET | Freq: Two times a day (BID) | ORAL | Status: DC
  Administered 2021-08-03 – 2021-08-05 (×4): 12.5 mg via ORAL
  Filled 2021-08-03 (×4): qty 1

## 2021-08-03 MED ORDER — OXYCODONE HCL 5 MG PO TABS
5.0000 mg | ORAL_TABLET | Freq: Four times a day (QID) | ORAL | Status: DC | PRN
  Administered 2021-08-04 – 2021-08-07 (×10): 5 mg via ORAL
  Filled 2021-08-03 (×10): qty 1

## 2021-08-03 MED ORDER — PANTOPRAZOLE SODIUM 40 MG IV SOLR
40.0000 mg | INTRAVENOUS | Status: DC
  Administered 2021-08-03 – 2021-08-07 (×4): 40 mg via INTRAVENOUS
  Filled 2021-08-03 (×4): qty 10

## 2021-08-03 MED ORDER — OXYBUTYNIN CHLORIDE ER 5 MG PO TB24
5.0000 mg | ORAL_TABLET | Freq: Every day | ORAL | Status: DC
  Administered 2021-08-04 – 2021-08-06 (×4): 5 mg via ORAL
  Filled 2021-08-03 (×4): qty 1

## 2021-08-03 MED ORDER — ONDANSETRON HCL 4 MG/2ML IJ SOLN
4.0000 mg | Freq: Four times a day (QID) | INTRAMUSCULAR | Status: DC | PRN
  Administered 2021-08-03 – 2021-08-07 (×6): 4 mg via INTRAVENOUS
  Filled 2021-08-03 (×7): qty 2

## 2021-08-03 NOTE — ED Provider Notes (Signed)
Taken at shift hand off Currently awaiting dissection study. Trop is elevated. Suspect she will  need admission  Physical Exam  BP (!) 187/100 (BP Location: Left Arm)   Pulse 100   Temp 98.7 F (37.1 C) (Oral)   Resp 18   Ht 5\' 3"  (1.6 m)   Wt 36.3 kg   SpO2 100%   BMI 14.17 kg/m   Physical Exam  Procedures  Procedures  ED Course / MDM    Medical Decision Making 66 year old female taken in handoff at shift change.  I have reviewed the patient's labs.  Her magnesium is low and I have ordered magnesium.  Her CT angiogram shows that she has what appears to be pneumonia bilaterally.  I discussed the case with Dr. Virgina Jock who recommends giving the patient some nitroglycerin to see if her PET chest pain is reduced and if it is helpful she may take nitro drip.  Otherwise if she has no contraindications to use heparin and have her admitted to [facility].  I discussed the case with Dr. Denton Brick who will admit the patient.  I have ordered IV antibiotics for treatment of her symptoms.  Pain meds ordered  Amount and/or Complexity of Data Reviewed Labs: ordered. Radiology: ordered.  Risk OTC drugs. Prescription drug management. Decision regarding hospitalization.          Margarita Mail, PA-C 08/04/21 [id number]    Dorie Rank, MD 08/04/21 [phone]

## 2021-08-03 NOTE — Assessment & Plan Note (Addendum)
Systolic 210Z to 128F. -Resume metoprolol, Norvasc

## 2021-08-03 NOTE — Assessment & Plan Note (Addendum)
Presenting with productive cough worsening baseline productive cough of 1 day duration. Unchanged chronic dyspnea.  Rules out for sepsis.  Afebrile.  WBC 7.4. CTA chest negative for PE, suggest right middle and lower lobe pneumonia. -DC Vanco and cefepime, will continue with IV Levaquin (allergy to erythromycin and doxycycline-itching and rash) -Mucolytic's as needed

## 2021-08-03 NOTE — ED Notes (Signed)
Ice given to pt

## 2021-08-03 NOTE — Progress Notes (Signed)
Pharmacy Antibiotic Note  Jasmine [name] is a 66 y.o. female admitted on 08/03/2021 with pneumonia.  Pharmacy has been consulted for vancomycin dosing. RVP negative.  Plan: Vancomycin 750mg  loading dose followed by vancomycin 500mg  q24hrs utilizing AUC calculator with goal AUC 500.  Height: 5\' 3"  (160 cm) Weight: 36.3 kg (80 lb) IBW/kg (Calculated) : 52.4  Temp (24hrs), Avg:97.9 F (36.6 C), Min:97.8 F (36.6 C), Max:97.9 F (36.6 C)  Recent Labs  Lab 08/03/21 1426  WBC 7.4  CREATININE 0.56  LATICACIDVEN 0.6    Estimated Creatinine Clearance: 39.1 mL/min (by C-G formula based on SCr of 0.56 mg/dL).    Allergies  Allergen Reactions   Lyrica [Pregabalin] Other (See Comments)    EXTREME SHAKING/TREMBLING   Darvon [Propoxyphene]     UNSPECIFIED REACTION    Gabapentin     Extreme shaking and trembling    Augmentin [Amoxicillin-Pot Clavulanate] Nausea And Vomiting    Has patient had a PCN reaction causing immediate rash, facial/tongue/throat swelling, SOB or lightheadedness with hypotension:no Has patient had a PCN reaction causing severe rash involving mucus membranes or skin necrosis:No Has patient had a PCN reaction that required hospitalization:No Has patient had a PCN reaction occurring within the last 10 years:Yes--ONLY N/V If all of the above answers are "NO", then may proceed with Cephalosporin use.    Erythromycin Itching and Rash        Vibramycin [Doxycycline Calcium] Itching and Rash    "[place] "    Antimicrobials this admission: Cefepime 1g x1 5/31  Dose adjustments this admission: N/a  Microbiology results: None ordered  Thank you for allowing pharmacy to be a part of this patient's care.  Jordan Hawks Shey Yott 08/03/2021 8:31 PM

## 2021-08-03 NOTE — ED Notes (Signed)
Lab at bedside

## 2021-08-03 NOTE — H&P (Signed)
History and Physical    Jasmine [name]:[id number] DOB: [birth year] DOA: 08/03/2021  PCP: Janora Norlander, DO   Patient coming from: Home  I have personally briefly reviewed patient's old medical records in [place]  Chief Complaint: Vomiting, chest pain  HPI: Jasmine [name] is a 66 y.o. female with medical history significant for  COPD, chronic hypoxic respiratory failure on 4 L, lung cancer status post right upper lobectomy, protein calorie malnutrition, and chronic pain.  Patient presented to the ED with complaints of multiple episodes of vomiting and loose stools of 4 days duration.  She reports average of 8-10 episodes of vomiting daily, and 5-6 episodes of diarrhea also.  She reports poor oral intake in the past 4 days.  She reports onset of chest pain with her GI symptoms.  Chest pain is bilateral lower ribs and to the side, constant, and hurts when she breathes and coughs.  She reports mid abdominal pain.  She denies black stools.  She denies NSAID use and uses Tylenol and oxycodone/OxyContin.  She denies known cardiac history.  She reports chronic productive cough that is worse today.  She has chronic difficulty breathing, that is unchanged.  Patient has a history of chronic nausea.  She reports today reports vomiting and diarrhea have significantly reduced in frequency.  Hospitalization 4/25 to 4/30 also for abdominal pain and vomiting.  She has chronic CBD dilatation, MRI could not be obtained due to implanted stimulator.  GI recommended pancreatic enzymes, and that her CBD had been dilated for years.  ED Course: Temperature 97.9.  Heart rate 101-130.  Respiratory rate 13 -18.  Blood pressure 546 -568 systolic.  O2 sats greater than 98% on room air.  Troponin 46 > 53.  Lactic acid 0.8.  Potassium 3.  Magnesium 1.6.  EKG without changes.  ED provider talked to cardiologist Dr. Virgina Jock, recommended starting heparin drip, admit to [facility].   I evaluated patient and  felt admission to [facility] was not needed at this time, sent a secure chat message to Dr. Virgina Jock, we agreed that patient can stay here for now.  Review of Systems: As per HPI all other systems reviewed and negative.  Past Medical History:  Diagnosis Date   Allergy    Anemia    Anxiety    Arthritis    Asthma    Carpal tunnel syndrome    Chronic back pain    Cigarette smoker 04/01/5168   Complication of anesthesia    in the past has had N/V, the last few surgeries she has not been   COPD (chronic obstructive pulmonary disease) ([place])    Easy bruising 07/10/2016   GERD (gastroesophageal reflux disease)    Headache    Heart murmur 2005   never had any problems   History of blood transfusion 2018   History of kidney stones    Hypertension    Hypoglycemia    Hypoglycemia    Iron deficiency anemia 07/11/2016   Neuropathy    both arms   Normocytic anemia 07/29/2015   Pancreatitis    Pneumonia    PONV (postoperative nausea and vomiting)    Postoperative anemia due to acute blood loss 11/15/2015   R lung cancer    Sciatica    Seizures ([place])    as a child when she would have an asthma attack. none as an adult   Shortness of breath dyspnea     Past Surgical History:  Procedure Laterality Date  ABDOMINAL HYSTERECTOMY  4315   APPLICATION OF ROBOTIC ASSISTANCE FOR SPINAL PROCEDURE N/A 09/05/2016   Procedure: APPLICATION OF ROBOTIC ASSISTANCE FOR SPINAL PROCEDURE;  Surgeon: Consuella Lose, MD;  Location: [place];  Service: Neurosurgery;  Laterality: N/A;   BACK SURGERY     BIOPSY  06/14/2020   Procedure: BIOPSY;  Surgeon: Daneil Dolin, MD;  [facility];  Service: Endoscopy;;   [place]   COLONOSCOPY     unsure last   COLONOSCOPY WITH PROPOFOL N/A 06/14/2020   Procedure: COLONOSCOPY WITH PROPOFOL;  Surgeon: Daneil Dolin, MD;  [facility];  Service: Endoscopy;  Laterality: N/A;  PM   cyst removal face     disk repair      neck and lumbar region   ESOPHAGOGASTRODUODENOSCOPY (EGD) WITH PROPOFOL N/A 06/14/2020   Procedure: ESOPHAGOGASTRODUODENOSCOPY (EGD) WITH PROPOFOL;  Surgeon: Daneil Dolin, MD;  [facility];  Service: Endoscopy;  Laterality: N/A;   FOOT SURGERY Bilateral    to file bones down    HARDWARE REMOVAL N/A 09/05/2016   Procedure: HARDWARE REMOVAL AND REPLACEMENT OF LUMBAR FIVE SCREWS;  Surgeon: Consuella Lose, MD;  Location: [place];  Service: Neurosurgery;  Laterality: N/A;   IMPLANTATION / PLACEMENT EPIDURAL NEUROSTIMULATOR ELECTRODES     INTERCOSTAL NERVE BLOCK Right 07/29/2020   Procedure: INTERCOSTAL NERVE BLOCK;  Surgeon: Melrose Nakayama, MD;  Location: [place];  Service: Thoracic;  Laterality: Right;   LAMINECTOMY     2006   lobectomy Right 07/2020   LUMBAR FUSION     LYMPH NODE BIOPSY Right 07/29/2020   Procedure: LYMPH NODE BIOPSY;  Surgeon: Melrose Nakayama, MD;  Location: [place];  Service: Thoracic;  Laterality: Right;   POLYPECTOMY  06/14/2020   Procedure: POLYPECTOMY INTESTINAL;  Surgeon: Daneil Dolin, MD;  [facility];  Service: Endoscopy;;   SPINAL CORD STIMULATOR BATTERY EXCHANGE     SPINAL CORD STIMULATOR INSERTION N/A 07/05/2016   Procedure: REPLACEMENT OF LUMBAR SPINAL CORD STIMULATOR BATTERY;  Surgeon: Newman Pies, MD;  Location: [place];  Service: Neurosurgery;  Laterality: N/A;   TONSILLECTOMY       reports that she has been smoking cigarettes. She has a 8.75 pack-year smoking history. She has never used smokeless tobacco. She reports current alcohol use of about 2.0 standard drinks per week. She reports that she does not use drugs.  Allergies  Allergen Reactions   Lyrica [Pregabalin] Other (See Comments)    EXTREME SHAKING/TREMBLING   Darvon [Propoxyphene]     UNSPECIFIED REACTION    Gabapentin     Extreme shaking and trembling    Augmentin [Amoxicillin-Pot Clavulanate] Nausea And Vomiting    Has patient had a PCN reaction  causing immediate rash, facial/tongue/throat swelling, SOB or lightheadedness with hypotension:no Has patient had a PCN reaction causing severe rash involving mucus membranes or skin necrosis:No Has patient had a PCN reaction that required hospitalization:No Has patient had a PCN reaction occurring within the last 10 years:Yes--ONLY N/V If all of the above answers are "NO", then may proceed with Cephalosporin use.    Erythromycin Itching and Rash        Vibramycin [Doxycycline Calcium] Itching and Rash    "[place] "    Family History  Problem Relation Age of Onset   Heart disease Mother    Diabetes Mother    Hypertension Mother    Hyperlipidemia Mother    COPD  Mother        smoked   Cancer Mother        bile duct   COPD Father    Diabetes Father    Heart disease Father    Cancer Father        throat   Dementia Father    Asthma Son    Hypertension Sister    Hyperlipidemia Sister    Hypertension Brother    Hyperlipidemia Brother    Hypertension Brother    Hyperlipidemia Brother    Emphysema Maternal Grandmother        smoked   Colon cancer Neg Hx    Colon polyps Neg Hx     Prior to Admission medications   Medication Sig Start Date End Date Taking? Authorizing Provider  albuterol (VENTOLIN HFA) 108 (90 Base) MCG/ACT inhaler USE 2 INHALATIONS EVERY 6 HOURS AS NEEDED FOR WHEEZING OR SHORTNESS OF BREATH (NEED TO BE SEEN BEFORE NEXT REFILL) 03/30/21   Ronnie Doss M, DO  amLODipine (NORVASC) 10 MG tablet Take 1 tablet (10 mg total) by mouth daily. 03/15/21   Johnson, Clanford L, MD  atorvastatin (LIPITOR) 40 MG tablet Take 1 tablet (40 mg total) by mouth daily. 07/09/20   Ronnie Doss M, DO  budesonide-formoterol (SYMBICORT) 160-4.5 MCG/ACT inhaler USE 2 INHALATIONS TWICE A DAY 01/17/21   Ronnie Doss M, DO  Cholecalciferol 125 MCG (5000 UT) capsule Take 5,000 Units by mouth daily.    [provider]  dextromethorphan-guaiFENesin (MUCINEX DM) 30-600 MG  12hr tablet Take 1 tablet by mouth 2 (two) times daily.    [provider]  DULoxetine (CYMBALTA) 30 MG capsule Take 3 capsules (90 mg total) by mouth daily. 07/09/20   Janora Norlander, DO  Iron Polysacch Cmplx-B12-FA (POLY-IRON 150 FORTE) 150-0.025-1 MG CAPS Take 1 capsule by mouth daily. 03/14/21   Murlean Iba, MD  L-Methylfolate-Algae-B12-B6 [place]) 3-90.314-2-35 MG CAPS Take 1 tablet by mouth daily as needed. 07/03/21   Murlean Iba, MD  lipase/protease/amylase 24000-76000 units CPEP Take 2 caps daily with meals and 1 cap with snacks 07/03/21   Johnson, Clanford L, MD  magic mouthwash w/lidocaine SOLN Gargle and spit 43mL every 6 hours as needed for sore throat. 62mL nystatin, 60mg  hydrocortisone tab, qs benadryl total 474mL. 07/11/21   Janora Norlander, DO  magnesium oxide (MAG-OX) 400 (241.3 Mg) MG tablet Take 400 mg by mouth 2 (two) times daily. 02/07/20   [provider]  metoprolol tartrate (LOPRESSOR) 25 MG tablet Take 0.5 tablets (12.5 mg total) by mouth 2 (two) times daily. 07/03/21   Johnson, Clanford L, MD  mirtazapine (REMERON) 7.5 MG tablet Take 1 tablet (7.5 mg total) by mouth at bedtime. Cross taper with 30mg  of Cymbalta for 3-4 weeks. Then stop cymbalta 07/11/21   Ronnie Doss M, DO  nicotine (NICODERM CQ) 14 mg/24hr patch Place 1 patch (14 mg total) onto the skin daily. 06/23/20   Janora Norlander, DO  Nutritional Supplements (ENSURE HIGH PROTEIN) LIQD Drink 1 can 3 times daily. 08/30/20   Janora Norlander, DO  omeprazole (PRILOSEC) 40 MG capsule Take one capsule once to twice daily before a meal. 01/18/21   Mahala Menghini, PA-C  ondansetron (ZOFRAN ODT) 4 MG disintegrating tablet Take 1 tablet (4 mg total) by mouth every 8 (eight) hours as needed for nausea or vomiting. Patient not taking: Reported on 07/12/2021 09/12/20   Maudie Flakes, MD  oxybutynin (DITROPAN-XL) 5 MG 24 hr tablet TAKE  1 TABLET(5 MG) BY MOUTH AT BEDTIME 05/20/21   Hassell Done,  Mary-Margaret, FNP  oxyCODONE-acetaminophen (PERCOCET) 10-325 MG tablet Take 1 tablet by mouth 5 (five) times daily. 08/27/20   [provider]  OXYCONTIN 15 MG 12 hr tablet Take 15 mg by mouth every 12 (twelve) hours. 08/28/20   [provider]  OXYGEN Inhale 4 L into the lungs See admin instructions. continuous    [provider]  polyethylene glycol (MIRALAX) 17 g packet Take 17 g by mouth daily. 03/14/21   Johnson, Clanford L, MD  promethazine (PHENERGAN) 25 MG suppository Place 1 suppository (25 mg total) rectally every 6 (six) hours as needed for nausea or vomiting. 06/07/21   Gottschalk, Ashly M, DO  SPIRIVA RESPIMAT 2.5 MCG/ACT AERS USE 2 INHALATIONS DAILY Patient taking differently: Inhale 2 puffs into the lungs daily. 07/13/20   Tanda Rockers, MD  tizanidine (ZANAFLEX) 6 MG capsule Take 1 capsule (6 mg total) by mouth 3 (three) times daily as needed for muscle spasms. 07/03/21   Murlean Iba, MD  traZODone (DESYREL) 150 MG tablet TAKE 1 TO 2 TABLETS AT BEDTIME 03/30/21   Janora Norlander, DO    Physical Exam: Vitals:   08/03/21 1830 08/03/21 1930 08/03/21 1933 08/03/21 2000  BP: (!) 140/98 (!) 164/92  (!) 142/82  Pulse: (!) 116 (!) 107 (!) 103 (!) 115  Resp: 13 14 15 16   Temp:      TempSrc:      SpO2: 100% 100% 100% 100%  Weight:      Height:        Constitutional: NAD, calm, comfortable Vitals:   08/03/21 1830 08/03/21 1930 08/03/21 1933 08/03/21 2000  BP: (!) 140/98 (!) 164/92  (!) 142/82  Pulse: (!) 116 (!) 107 (!) 103 (!) 115  Resp: 13 14 15 16   Temp:      TempSrc:      SpO2: 100% 100% 100% 100%  Weight:      Height:       Eyes: PERRL, lids and conjunctivae normal ENMT: Mucous membranes are moist. .  Neck: normal, supple, no masses, no thyromegaly Respiratory: clear to auscultation bilaterally, no wheezing, no crackles. Normal respiratory effort. No accessory muscle use.  Cardiovascular: Regular rate and rhythm, no murmurs / rubs /  gallops. No extremity edema.  Lower extremities warm. Abdomen: no tenderness, no masses palpated. No hepatosplenomegaly. Bowel sounds positive.  Musculoskeletal: no clubbing / cyanosis. No joint deformity upper and lower extremities.  Skin: no rashes, lesions, ulcers. No induration Neurologic: No apparent cranial nerve abnormality, 4+ by 5 strength in all extremities. Psychiatric: Normal judgment and insight. Alert and oriented x 3. Normal mood.   Labs on Admission: I have personally reviewed following labs and imaging studies  CBC: Recent Labs  Lab 08/03/21 1426  WBC 7.4  NEUTROABS 6.3  HGB 11.3*  HCT 37.0  MCV 112.1*  PLT 364   Basic Metabolic Panel: Recent Labs  Lab 08/03/21 1426 08/03/21 1519 08/03/21 1947  NA 134*  --   --   K 3.0*  --   --   CL 98  --   --   CO2 23  --   --   GLUCOSE 106*  --   --   BUN 9  --   --   CREATININE 0.56  --   --   CALCIUM 8.9  --   --   MG  --  1.6* 1.5*   GFR: Estimated Creatinine  Clearance: 39.1 mL/min (by C-G formula based on SCr of 0.56 mg/dL). Liver Function Tests: Recent Labs  Lab 08/03/21 1426  AST 23  ALT 15  ALKPHOS 122  BILITOT 1.2  PROT 7.2  ALBUMIN 3.1*   Recent Labs  Lab 08/03/21 1459  LIPASE 18    Radiological Exams on Admission: DG Chest Portable 1 View  Result Date: 08/03/2021 CLINICAL DATA:  Chest pain.  COPD EXAM: PORTABLE CHEST 1 VIEW COMPARISON:  07/12/2021 FINDINGS: COPD with hyperinflation and pulmonary scarring. Postsurgical changes and scarring in the right upper lobe. Blunting right costophrenic angle unchanged compatible with scarring from surgery. Spinal cord stimulator midthoracic spine unchanged No acute infiltrate.  No heart failure or edema. IMPRESSION: COPD and scarring with postoperative changes on the right. No acute cardiopulmonary process. Electronically Signed   By: Franchot Gallo M.D.   On: 08/03/2021 15:21   CT Angio Chest/Abd/Pel for Dissection W and/or Wo Contrast  Result Date:  08/03/2021 CLINICAL DATA:  Acute aortic syndrome (AAS) suspected EXAM: CT ANGIOGRAPHY CHEST, ABDOMEN AND PELVIS TECHNIQUE: Non-contrast CT of the chest was initially obtained. Multidetector CT imaging through the chest, abdomen and pelvis was performed using the standard protocol during bolus administration of intravenous contrast. Multiplanar reconstructed images and MIPs were obtained and reviewed to evaluate the vascular anatomy. RADIATION DOSE REDUCTION: This exam was performed according to the departmental dose-optimization program which includes automated exposure control, adjustment of the mA and/or kV according to patient size and/or use of iterative reconstruction technique. CONTRAST:  47mL OMNIPAQUE IOHEXOL 350 MG/ML SOLN COMPARISON:  CT abdomen pelvis 03/11/2021, ultrasound abdomen 06/29/2021, CT chest 06/27/2021 FINDINGS: CTA CHEST FINDINGS Cardiovascular: Normal heart size. No significant pericardial effusion. The thoracic aorta is normal in caliber. At least moderate atherosclerotic plaque of the thoracic aorta. At least 2 vessel coronary artery calcifications. The main pulmonary artery is normal in caliber. Enlarged right main pulmonary artery. No central or segmental pulmonary embolus. Mediastinum/Nodes: No enlarged mediastinal, hilar, or axillary lymph nodes. Subcentimeter right thyroid gland hypodensity. Not clinically significant; no follow-up imaging recommended (ref: J Am Coll Radiol. 2015 Feb;12(2): 143-50). Trachea and esophagus demonstrate no significant findings. Lungs/Pleura: Severe centrilobular emphysematous changes. Surgical changes related to right upper lobectomy. Chronic expansion of the right lower lobe. Interval development of a 1.2 x 1 2.6 cm nodular like consolidation within the right lower lobe (7:40). Interval increase in partial collapse of the right middle lobe with patchy airspace opacity. No pulmonary nodule. No pulmonary mass. No pleural effusion. No pneumothorax.  Musculoskeletal: No chest wall abnormality. No suspicious lytic or blastic osseous lesions. No acute displaced fracture. Multilevel degenerative changes of the spine. Neural stimulator leads terminate in the posterior central canal at the T6 through T7 levels. Review of the MIP images confirms the above findings. CTA ABDOMEN AND PELVIS FINDINGS VASCULAR Aorta: Severe atherosclerotic plaque. Normal caliber aorta without aneurysm, dissection, vasculitis or significant stenosis. Celiac: Patent without evidence of aneurysm, dissection, vasculitis or significant stenosis. SMA: Patent without evidence of aneurysm, dissection, vasculitis or significant stenosis. Renals: Both renal arteries are patent without evidence of aneurysm, dissection, vasculitis, fibromuscular dysplasia or significant stenosis. IMA: Patent without evidence of aneurysm, dissection, vasculitis or significant stenosis. Inflow: Atherosclerotic plaque. Patent without evidence of aneurysm, dissection, vasculitis or significant stenosis. Veins: No obvious venous abnormality within the limitations of this arterial phase study. Review of the MIP images confirms the above findings. NON-VASCULAR Hepatobiliary: No focal liver abnormality. No CT evidence of gallstones. Persistent gallbladder wall thickening. No definite  pericholecystic fluid. Enlarged common bile duct measuring up to 1.7 cm. Pancreas: No focal lesion. Normal pancreatic contour. No surrounding inflammatory changes. No main pancreatic ductal dilatation. Spleen: Normal in size without focal abnormality. Adrenals/Urinary Tract: No adrenal nodule bilaterally. Bilateral kidneys enhance symmetrically. Subcentimeter hypodensities bilaterally are too small to characterize. Pericentimeter fluid density lesions within bilateral kidneys statistically likely represent simple renal cysts. Simple renal cysts, in the absence of clinically indicated signs/symptoms, require no independent follow-up. There is a  1.1 cm hypodense lesion within the left kidney with a density of 29 Hounsfield units (5:108). No hydronephrosis. No hydroureter. The urinary bladder is unremarkable. Stomach/Bowel: Stomach is within normal limits. No evidence of bowel wall thickening or dilatation. The appendix is not definitely identified with no inflammatory changes in the right lower quadrant to suggest acute appendicitis. Lymphatic: Slightly limited evaluation of the to peritoneum due to streak artifact originating from the lumbar surgical hardware. No lymphadenopathy. Reproductive: Status post hysterectomy. No adnexal masses. Other: No intraperitoneal free fluid. No intraperitoneal free gas. No organized fluid collection. Musculoskeletal: No abdominal wall hernia or abnormality. Diffusely decreased bone density. No suspicious lytic or blastic osseous lesions. No acute displaced fracture. Status post L3-L5 posterolateral and interbody fusion. Multilevel degenerative changes of the spine. Review of the MIP images confirms the above findings. IMPRESSION: 1. No aorta aneurysm or dissection. Aortic aneurysm NOS (ICD10-I71.9). 2. Interval development of a 1.2 x 1 2.6 cm nodular-like consolidation within the right lower lobe. Finding likely represents infection. Underlying malignancy not fully excluded but less likely given appearance of the right upper lobe 1 month ago. Additional imaging evaluation or consultation with Pulmonology or Thoracic Surgery recommended. 3. Interval increase in partial collapse and patchy airspace opacity of the right middle lobe. Finding may represent a combination of atelectasis versus infection/inflammation. 4. Status post right upper lobectomy. 5. Interval development of a 1.2 x 1 2.6 cm nodular like consolidation within the right lower lobe. 6. Enlarged common bile duct with associated persistent nonspecific gallbladder wall thickening. Gallbladder wall thickening can be seen in the setting of chronic liver disease  versus acute cholecystitis. Correlate with liver function tests. 7. Indeterminate 1.1 cm left renal lesion. Finding may represent a minimally complex renal cyst versus solid renal lesion. Consider nonemergent further evaluation with MRI or CT renal protocol. 8. Aortic Atherosclerosis (ICD10-I70.0). Electronically Signed   By: Iven Finn M.D.   On: 08/03/2021 19:24    EKG: Independently reviewed.  Sinus tachycardia rate 115.  QTc 431.  No significant ST or T wave changes compared to prior.  Assessment/Plan Principal Problem:   PNA (pneumonia) Active Problems:   HTN (hypertension)   Chronic narcotic use   COPD GOLD II    Chronic respiratory failure with hypoxia (HCC)   Chest pain   Abdominal pain with vomiting    Assessment and Plan: * PNA (pneumonia) Presenting with productive cough worsening baseline productive cough of 1 day duration. Unchanged chronic dyspnea.  Rules out for sepsis.  Afebrile.  WBC 7.4. CTA chest negative for PE, suggest right middle and lower lobe pneumonia. -DC Vanco and cefepime, will continue with IV Levaquin (allergy to erythromycin and doxycycline-itching and rash) -Mucolytic's as needed  Abdominal pain with vomiting Vomiting, diarrhea.  Possible gastroenteritis versus chronic issue.  She has had prior multiple hospitalizations for same.  She reports epigastric pain, but abdominal Exam is benign.  Denies melena.  Abdominal pain possibly from gastritis, or vomiting.  Recent hospitalization for same.  Evaluated by GI.  She has chronic CBD dilatation present for years, and unable to get MRI due to spinal stimulator. -Stool C. Difficile -2 L bolus given, continue N/s + 40 KCL 100cc/hr x 20hrs -As needed Zofran -IV Protonix 40 daily  Chest pain Atypical. Constant, bilateral lower ribs on the side, worse with coughing, deep breathing.  Appears to be musculoskeletal in etiology.  CTA chest negative for PE, suggest pneumonia.  Troponin 46 > 53.  EKG unchanged.  No  cardiac history. -Initial plan was to admit to [facility] and heparin drip was started, I talked to cardiologist- Dr. Virgina Jock via secure chat, considering acute comorbidities, we agreed patient can stay here for now. -Trend troponin -EKG in the morning -Continue heparin drip for now, low threshold to discontinue pending clinical course and troponin trend. -Consult cardiology here if needed in a.m. -Last echo-limited, for chest pain 11/2020-no remark on EF, showed grade 1 DD, left ventricular endocardial border was not optimally defined to evaluate regional wall motion.  Chronic respiratory failure with hypoxia (HCC) On 4 L.  Sats currently greater than 98%.  COPD GOLD II  Stable. -DuoNebs as needed -Resume home BD regimen  Chronic narcotic use Chronic back pain, with sciatica and chronic right hip pain.  Resume home OxyContin 15 every 12 hourly and oxycodone 10 every 4 hourly.  Chronic narcotics may be contributing to her chronic nausea and vomiting.  -Resume home opiates.  HTN (hypertension) Systolic 703J to 009F. -Resume metoprolol, Norvasc    DVT prophylaxis:  Lovenox Code Status:  Full Family Communication: None at bedside Disposition Plan: ~/> 2 days Consults called: None Admission status: Inpt tele  I certify that at the point of admission it is my clinical judgment that the patient will require inpatient hospital care spanning beyond 2 midnights from the point of admission due to high intensity of service, high risk for further deterioration and high frequency of surveillance required.    Author: Bethena Roys, MD 08/03/2021 10:47 PM  For on call review [email or web address].

## 2021-08-03 NOTE — ED Notes (Signed)
Pt states that the nitro tabs have helped with the cp "a tiny bit". Pt is unsure whether the dilauded helped more with the cp

## 2021-08-03 NOTE — Progress Notes (Signed)
ANTICOAGULATION CONSULT NOTE - Initial Consult  Pharmacy Consult for heparin Indication: chest pain/ACS  Allergies  Allergen Reactions   Lyrica [Pregabalin] Other (See Comments)    EXTREME SHAKING/TREMBLING   Darvon [Propoxyphene]     UNSPECIFIED REACTION    Gabapentin     Extreme shaking and trembling    Augmentin [Amoxicillin-Pot Clavulanate] Nausea And Vomiting    Has patient had a PCN reaction causing immediate rash, facial/tongue/throat swelling, SOB or lightheadedness with hypotension:no Has patient had a PCN reaction causing severe rash involving mucus membranes or skin necrosis:No Has patient had a PCN reaction that required hospitalization:No Has patient had a PCN reaction occurring within the last 10 years:Yes--ONLY N/V If all of the above answers are "NO", then may proceed with Cephalosporin use.    Erythromycin Itching and Rash        Vibramycin [Doxycycline Calcium] Itching and Rash    "[place] "    Patient Measurements: Height: 5\' 3"  (160 cm) Weight: 36.3 kg (80 lb) IBW/kg (Calculated) : 52.4 Heparin Dosing Weight: 36.3 kg  Vital Signs: Temp: 97.8 F (36.6 C) (05/31 1600) Temp Source: Oral (05/31 1600) BP: 142/82 (05/31 2000) Pulse Rate: 115 (05/31 2000)  Labs: Recent Labs    08/03/21 1426 08/03/21 1459 08/03/21 1746  HGB 11.3*  --   --   HCT 37.0  --   --   PLT 376  --   --   CREATININE 0.56  --   --   TROPONINIHS  --  46* 53*    Estimated Creatinine Clearance: 39.1 mL/min (by C-G formula based on SCr of 0.56 mg/dL).   Medical History: Past Medical History:  Diagnosis Date   Allergy    Anemia    Anxiety    Arthritis    Asthma    Carpal tunnel syndrome    Chronic back pain    Cigarette smoker 6/94/8546   Complication of anesthesia    in the past has had N/V, the last few surgeries she has not been   COPD (chronic obstructive pulmonary disease) ([place])    Easy bruising 07/10/2016   GERD (gastroesophageal reflux disease)    Headache     Heart murmur 2005   never had any problems   History of blood transfusion 2018   History of kidney stones    Hypertension    Hypoglycemia    Hypoglycemia    Iron deficiency anemia 07/11/2016   Neuropathy    both arms   Normocytic anemia 07/29/2015   Pancreatitis    Pneumonia    PONV (postoperative nausea and vomiting)    Postoperative anemia due to acute blood loss 11/15/2015   R lung cancer    Sciatica    Seizures ([place])    as a child when she would have an asthma attack. none as an adult   Shortness of breath dyspnea     Medications:  Scheduled:   heparin  2,200 Units Intravenous Once   Infusions:   ceFEPime (MAXIPIME) IV     heparin      Assessment: 66 yo female with h/o COPD, lung cancer s/p R lobectomy, and chronic respiratory failure presented with epigastric tenderness, chest pain, and N/V per provider note. Troponin is increasing.   Goal of Therapy:  Heparin level 0.3-0.7 units/ml Monitor platelets by anticoagulation protocol: Yes   Plan:  Give 2200 units bolus x 1 Start heparin infusion at 450 units/hr Check anti-Xa level in 6-8 hours and daily while on heparin Continue to  monitor H&H and platelets  Lolita Rieger 08/03/2021,8:19 PM

## 2021-08-03 NOTE — Assessment & Plan Note (Signed)
Chronic back pain, with sciatica and chronic right hip pain.  Resume home OxyContin 15 every 12 hourly and oxycodone 10 every 4 hourly.  Chronic narcotics may be contributing to her chronic nausea and vomiting.  -Resume home opiates.

## 2021-08-03 NOTE — Assessment & Plan Note (Signed)
Stable. -DuoNebs as needed -Resume home BD regimen

## 2021-08-03 NOTE — Assessment & Plan Note (Signed)
On 4 L.  Sats currently greater than 98%.

## 2021-08-03 NOTE — ED Triage Notes (Signed)
Patient via EMS with complaints of abdominal pain, nausea, vomiting, and diarrhea that started Sunday.

## 2021-08-03 NOTE — Assessment & Plan Note (Signed)
Atypical. Constant, bilateral lower ribs on the side, worse with coughing, deep breathing.  Appears to be musculoskeletal in etiology.  CTA chest negative for PE, suggest pneumonia.  Troponin 46 > 53.  EKG unchanged.  No cardiac history. -Initial plan was to admit to [facility] and heparin drip was started, I talked to cardiologist- Dr. Virgina Jock via secure chat, considering acute comorbidities, we agreed patient can stay here for now. -Trend troponin -EKG in the morning -Continue heparin drip for now, low threshold to discontinue pending clinical course and troponin trend. -Consult cardiology here if needed in a.m. -Last echo-limited, for chest pain 11/2020-no remark on EF, showed grade 1 DD, left ventricular endocardial border was not optimally defined to evaluate regional wall motion.

## 2021-08-03 NOTE — Assessment & Plan Note (Addendum)
Vomiting, diarrhea.  Possible gastroenteritis versus chronic issue.  She has had prior multiple hospitalizations for same.  She reports epigastric pain, but abdominal Exam is benign.  Denies melena.  Abdominal pain possibly from gastritis, or vomiting.  Recent hospitalization for same.  Evaluated by GI.  She has chronic CBD dilatation present for years, and unable to get MRI due to spinal stimulator. -Stool C. Difficile -2 L bolus given, continue N/s + 40 KCL 100cc/hr x 20hrs -As needed Zofran -IV Protonix 40 daily

## 2021-08-03 NOTE — ED Provider Notes (Signed)
[facility] Provider Note   CSN: [id number] Arrival date & time: 08/03/21  1304     History  Chief Complaint  Patient presents with   Abdominal Pain    Jasmine [name] is a 66 y.o. female.  HPI  With medical history including COPD, chronic respiratory failure, lung cancer with right lobectomy chronic pain, as well as nausea and vomiting presents with complaints of epigastric tenderness and nausea and vomiting.  Patient is a started on Saturday, came on suddenly, has had multiple episodes of nausea vomiting without coffee-ground emesis or hematemesis, she states she was having diarrhea but this is since resolved denies any melena or hematochezia.  She states pain is mainly in her epigastric region but since she is vomiting much she is not having some chest pain pain which radiates into her back, states that she has been unable to tolerate p.o., she states that this is happened in the past this feels like the last time she was here, states she been taking her p.o. Phenergan without much relief, she denies history of NSAID use alcohol use, no 7 stomach history she has other complaints.  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  Recent colonoscopy and endoscopy are also negative for acute findings patient's last dissection study was in September 2022 which was negative for acute findings.  Home Medications Prior to Admission medications   Medication Sig Start Date End Date Taking? Authorizing Provider  albuterol (VENTOLIN HFA) 108 (90 Base) MCG/ACT inhaler USE 2 INHALATIONS EVERY 6 HOURS AS NEEDED FOR WHEEZING OR SHORTNESS OF  BREATH (NEED TO BE SEEN BEFORE NEXT REFILL) 03/30/21   Ronnie Doss M, DO  amLODipine (NORVASC) 10 MG tablet Take 1 tablet (10 mg total) by mouth daily. 03/15/21   Johnson, Clanford L, MD  atorvastatin (LIPITOR) 40 MG tablet Take 1 tablet (40 mg total) by mouth daily. 07/09/20   Ronnie Doss M, DO  budesonide-formoterol (SYMBICORT) 160-4.5 MCG/ACT inhaler USE 2 INHALATIONS TWICE A DAY 01/17/21   Ronnie Doss M, DO  Cholecalciferol 125 MCG (5000 UT) capsule Take 5,000 Units by mouth daily.    [provider]  dextromethorphan-guaiFENesin (MUCINEX DM) 30-600 MG 12hr tablet Take 1 tablet by mouth 2 (two) times daily.    [provider]  DULoxetine (CYMBALTA) 30 MG capsule Take 3 capsules (90 mg total) by mouth daily. 07/09/20   Janora Norlander, DO  Iron Polysacch Cmplx-B12-FA (POLY-IRON 150 FORTE) 150-0.025-1 MG CAPS Take 1 capsule by mouth daily. 03/14/21   Murlean Iba, MD  L-Methylfolate-Algae-B12-B6 Glade [place]) 3-90.314-2-35 MG CAPS Take 1 tablet by mouth daily as needed. 07/03/21   Murlean Iba, MD  lipase/protease/amylase 24000-76000 units CPEP Take 2 caps daily with meals and 1 cap with snacks 07/03/21   Johnson, Clanford L, MD  magic mouthwash w/lidocaine SOLN Gargle and spit 33m every 6 hours as needed for sore throat. 665mnystatin, 6044mydrocortisone tab, qs benadryl total 480m47m/8/23   GottJanora Norlander  magnesium oxide (MAG-OX) 400 (241.3 Mg) MG tablet Take 400 mg by mouth 2 (two) times daily. 02/07/20   [provider]  metoprolol tartrate (  LOPRESSOR) 25 MG tablet Take 0.5 tablets (12.5 mg total) by mouth 2 (two) times daily. 07/03/21   Johnson, Clanford L, MD  mirtazapine (REMERON) 7.5 MG tablet Take 1 tablet (7.5 mg total) by mouth at bedtime. Cross taper with 34m of Cymbalta for 3-4 weeks. Then stop cymbalta 07/11/21   GRonnie DossM, DO  nicotine (NICODERM CQ) 14 mg/24hr patch Place 1 patch (14 mg total) onto the skin daily.  06/23/20   GJanora Norlander DO  Nutritional Supplements (ENSURE HIGH PROTEIN) LIQD Drink 1 can 3 times daily. 08/30/20   GJanora Norlander DO  omeprazole (PRILOSEC) 40 MG capsule Take one capsule once to twice daily before a meal. 01/18/21   LMahala Menghini PA-C  ondansetron (ZOFRAN ODT) 4 MG disintegrating tablet Take 1 tablet (4 mg total) by mouth every 8 (eight) hours as needed for nausea or vomiting. Patient not taking: Reported on 07/12/2021 09/12/20   BMaudie Flakes MD  oxybutynin (DITROPAN-XL) 5 MG 24 hr tablet TAKE 1 TABLET(5 MG) BY MOUTH AT BEDTIME 05/20/21   MHassell Done Mary-Margaret, FNP  oxyCODONE-acetaminophen (PERCOCET) 10-325 MG tablet Take 1 tablet by mouth 5 (five) times daily. 08/27/20   [provider]  OXYCONTIN 15 MG 12 hr tablet Take 15 mg by mouth every 12 (twelve) hours. 08/28/20   [provider]  OXYGEN Inhale 4 L into the lungs See admin instructions. continuous    [provider]  polyethylene glycol (MIRALAX) 17 g packet Take 17 g by mouth daily. 03/14/21   Johnson, Clanford L, MD  promethazine (PHENERGAN) 25 MG suppository Place 1 suppository (25 mg total) rectally every 6 (six) hours as needed for nausea or vomiting. 06/07/21   Gottschalk, Ashly M, DO  SPIRIVA RESPIMAT 2.5 MCG/ACT AERS USE 2 INHALATIONS DAILY Patient taking differently: Inhale 2 puffs into the lungs daily. 07/13/20   WTanda Rockers MD  tizanidine (ZANAFLEX) 6 MG capsule Take 1 capsule (6 mg total) by mouth 3 (three) times daily as needed for muscle spasms. 07/03/21   JMurlean Iba MD  traZODone (DESYREL) 150 MG tablet TAKE 1 TO 2 TABLETS AT BEDTIME 03/30/21   GRonnie DossM, DO      Allergies    Lyrica [pregabalin], Darvon [propoxyphene], Gabapentin, Augmentin [amoxicillin-pot clavulanate], Erythromycin, and Vibramycin [doxycycline calcium]    Review of Systems   Review of Systems  Constitutional:  Negative for chills and fever.  Respiratory:  Negative for  shortness of breath.   Cardiovascular:  Negative for chest pain.  Gastrointestinal:  Positive for abdominal pain, nausea and vomiting. Negative for diarrhea.  Neurological:  Negative for headaches.   Physical Exam Updated Vital Signs BP (!) 140/98   Pulse (!) 116   Temp 97.8 F (36.6 C) (Oral)   Resp 13   Ht _0  (1.6 m)   Wt 36.3 kg   SpO2 100%   BMI 14.17 kg/m  Physical Exam Vitals and nursing note reviewed.  Constitutional:      General: She is not in acute distress.    Appearance: She is not ill-appearing.  HENT:     Head: Normocephalic and atraumatic.     Nose: No congestion.  Eyes:     Conjunctiva/sclera: Conjunctivae normal.  Cardiovascular:     Rate and Rhythm: Regular rhythm. Tachycardia present.     Pulses: Normal pulses.     Heart sounds: No murmur heard.   No friction rub. No gallop.  Pulmonary:     Effort: No  respiratory distress.     Breath sounds: No wheezing, rhonchi or rales.  Abdominal:     Palpations: Abdomen is soft.     Tenderness: There is abdominal tenderness. There is no right CVA tenderness or left CVA tenderness.     Comments: Nondistended soft, tenderness in the epigastric region without guarding rebound as or peritoneal sign negative Murphy sign McBurney point no CVA tenderness.  Skin:    General: Skin is warm and dry.  Neurological:     Mental Status: She is alert.  Psychiatric:        Mood and Affect: Mood normal.    ED Results / Procedures / Treatments   Labs (all labs ordered are listed, but only abnormal results are displayed) Labs Reviewed  CBC WITH DIFFERENTIAL/PLATELET - Abnormal; Notable for the following components:      Result Value   RBC 3.30 (*)    Hemoglobin 11.3 (*)    MCV 112.1 (*)    MCH 34.2 (*)    All other components within normal limits  COMPREHENSIVE METABOLIC PANEL - Abnormal; Notable for the following components:   Sodium 134 (*)    Potassium 3.0 (*)    Glucose, Bld 106 (*)    Albumin 3.1 (*)    All  other components within normal limits  RAPID URINE DRUG SCREEN, HOSP PERFORMED - Abnormal; Notable for the following components:   Opiates POSITIVE (*)    All other components within normal limits  MAGNESIUM - Abnormal; Notable for the following components:   Magnesium 1.6 (*)    All other components within normal limits  TROPONIN I (HIGH SENSITIVITY) - Abnormal; Notable for the following components:   Troponin I (High Sensitivity) 46 (*)    All other components within normal limits  TROPONIN I (HIGH SENSITIVITY) - Abnormal; Notable for the following components:   Troponin I (High Sensitivity) 53 (*)    All other components within normal limits  RESP PANEL BY RT-PCR (FLU A&B, COVID) ARPGX2  ETHANOL  LACTIC ACID, PLASMA  LIPASE, BLOOD    EKG EKG Interpretation  Date/Time:  Wednesday Aug 03 2021 15:53:21 EDT Ventricular Rate:  115 PR Interval:  146 QRS Duration: 75 QT Interval:  311 QTC Calculation: 431 R Axis:   52 Text Interpretation: Sinus tachycardia Consider anterior infarct Since last tracing rate faster Confirmed by Dorie Rank [phone]) on 08/03/2021 3:57:25 PM  Radiology DG Chest Portable 1 View  Result Date: 08/03/2021 CLINICAL DATA:  Chest pain.  COPD EXAM: PORTABLE CHEST 1 VIEW COMPARISON:  07/12/2021 FINDINGS: COPD with hyperinflation and pulmonary scarring. Postsurgical changes and scarring in the right upper lobe. Blunting right costophrenic angle unchanged compatible with scarring from surgery. Spinal cord stimulator midthoracic spine unchanged No acute infiltrate.  No heart failure or edema. IMPRESSION: COPD and scarring with postoperative changes on the right. No acute cardiopulmonary process. Electronically Signed   By: Franchot Gallo M.D.   On: 08/03/2021 15:21   CT Angio Chest/Abd/Pel for Dissection W and/or Wo Contrast  Result Date: 08/03/2021 CLINICAL DATA:  Acute aortic syndrome (AAS) suspected EXAM: CT ANGIOGRAPHY CHEST, ABDOMEN AND PELVIS TECHNIQUE:  Non-contrast CT of the chest was initially obtained. Multidetector CT imaging through the chest, abdomen and pelvis was performed using the standard protocol during bolus administration of intravenous contrast. Multiplanar reconstructed images and MIPs were obtained and reviewed to evaluate the vascular anatomy. RADIATION DOSE REDUCTION: This exam was performed according to the departmental dose-optimization program which includes automated exposure control, adjustment  of the mA and/or kV according to patient size and/or use of iterative reconstruction technique. CONTRAST:  57mL OMNIPAQUE IOHEXOL 350 MG/ML SOLN COMPARISON:  CT abdomen pelvis 03/11/2021, ultrasound abdomen 06/29/2021, CT chest 06/27/2021 FINDINGS: CTA CHEST FINDINGS Cardiovascular: Normal heart size. No significant pericardial effusion. The thoracic aorta is normal in caliber. At least moderate atherosclerotic plaque of the thoracic aorta. At least 2 vessel coronary artery calcifications. The main pulmonary artery is normal in caliber. Enlarged right main pulmonary artery. No central or segmental pulmonary embolus. Mediastinum/Nodes: No enlarged mediastinal, hilar, or axillary lymph nodes. Subcentimeter right thyroid gland hypodensity. Not clinically significant; no follow-up imaging recommended (ref: J Am Coll Radiol. 2015 Feb;12(2): 143-50). Trachea and esophagus demonstrate no significant findings. Lungs/Pleura: Severe centrilobular emphysematous changes. Surgical changes related to right upper lobectomy. Chronic expansion of the right lower lobe. Interval development of a 1.2 x 1 2.6 cm nodular like consolidation within the right lower lobe (7:40). Interval increase in partial collapse of the right middle lobe with patchy airspace opacity. No pulmonary nodule. No pulmonary mass. No pleural effusion. No pneumothorax. Musculoskeletal: No chest wall abnormality. No suspicious lytic or blastic osseous lesions. No acute displaced fracture. Multilevel  degenerative changes of the spine. Neural stimulator leads terminate in the posterior central canal at the T6 through T7 levels. Review of the MIP images confirms the above findings. CTA ABDOMEN AND PELVIS FINDINGS VASCULAR Aorta: Severe atherosclerotic plaque. Normal caliber aorta without aneurysm, dissection, vasculitis or significant stenosis. Celiac: Patent without evidence of aneurysm, dissection, vasculitis or significant stenosis. SMA: Patent without evidence of aneurysm, dissection, vasculitis or significant stenosis. Renals: Both renal arteries are patent without evidence of aneurysm, dissection, vasculitis, fibromuscular dysplasia or significant stenosis. IMA: Patent without evidence of aneurysm, dissection, vasculitis or significant stenosis. Inflow: Atherosclerotic plaque. Patent without evidence of aneurysm, dissection, vasculitis or significant stenosis. Veins: No obvious venous abnormality within the limitations of this arterial phase study. Review of the MIP images confirms the above findings. NON-VASCULAR Hepatobiliary: No focal liver abnormality. No CT evidence of gallstones. Persistent gallbladder wall thickening. No definite pericholecystic fluid. Enlarged common bile duct measuring up to 1.7 cm. Pancreas: No focal lesion. Normal pancreatic contour. No surrounding inflammatory changes. No main pancreatic ductal dilatation. Spleen: Normal in size without focal abnormality. Adrenals/Urinary Tract: No adrenal nodule bilaterally. Bilateral kidneys enhance symmetrically. Subcentimeter hypodensities bilaterally are too small to characterize. Pericentimeter fluid density lesions within bilateral kidneys statistically likely represent simple renal cysts. Simple renal cysts, in the absence of clinically indicated signs/symptoms, require no independent follow-up. There is a 1.1 cm hypodense lesion within the left kidney with a density of 29 Hounsfield units (5:108). No hydronephrosis. No hydroureter. The  urinary bladder is unremarkable. Stomach/Bowel: Stomach is within normal limits. No evidence of bowel wall thickening or dilatation. The appendix is not definitely identified with no inflammatory changes in the right lower quadrant to suggest acute appendicitis. Lymphatic: Slightly limited evaluation of the to peritoneum due to streak artifact originating from the lumbar surgical hardware. No lymphadenopathy. Reproductive: Status post hysterectomy. No adnexal masses. Other: No intraperitoneal free fluid. No intraperitoneal free gas. No organized fluid collection. Musculoskeletal: No abdominal wall hernia or abnormality. Diffusely decreased bone density. No suspicious lytic or blastic osseous lesions. No acute displaced fracture. Status post L3-L5 posterolateral and interbody fusion. Multilevel degenerative changes of the spine. Review of the MIP images confirms the above findings. IMPRESSION: 1. No aorta aneurysm or dissection. Aortic aneurysm NOS (ICD10-I71.9). 2. Interval development of a 1.2 x 1  2.6 cm nodular-like consolidation within the right lower lobe. Finding likely represents infection. Underlying malignancy not fully excluded but less likely given appearance of the right upper lobe 1 month ago. Additional imaging evaluation or consultation with Pulmonology or Thoracic Surgery recommended. 3. Interval increase in partial collapse and patchy airspace opacity of the right middle lobe. Finding may represent a combination of atelectasis versus infection/inflammation. 4. Status post right upper lobectomy. 5. Interval development of a 1.2 x 1 2.6 cm nodular like consolidation within the right lower lobe. 6. Enlarged common bile duct with associated persistent nonspecific gallbladder wall thickening. Gallbladder wall thickening can be seen in the setting of chronic liver disease versus acute cholecystitis. Correlate with liver function tests. 7. Indeterminate 1.1 cm left renal lesion. Finding may represent a  minimally complex renal cyst versus solid renal lesion. Consider nonemergent further evaluation with MRI or CT renal protocol. 8. Aortic Atherosclerosis (ICD10-I70.0). Electronically Signed   By: Iven Finn M.D.   On: 08/03/2021 19:24    Procedures Procedures    Medications Ordered in ED Medications  ondansetron (ZOFRAN) injection 4 mg (4 mg Intravenous Given 08/03/21 1423)  sodium chloride 0.9 % bolus 1,000 mL (1,000 mLs Intravenous Bolus 08/03/21 1422)  HYDROmorphone (DILAUDID) injection 0.5 mg (0.5 mg Intravenous Given 08/03/21 1423)  famotidine (PEPCID) IVPB 20 mg premix (0 mg Intravenous Stopped 08/03/21 1627)  alum & mag hydroxide-simeth (MAALOX/MYLANTA) 200-200-20 MG/5ML suspension 30 mL (30 mLs Oral Given 08/03/21 1637)  sodium chloride 0.9 % bolus 1,000 mL (1,000 mLs Intravenous Bolus 08/03/21 1834)  potassium chloride SA (KLOR-CON M) CR tablet 40 mEq (40 mEq Oral Given 08/03/21 1832)  magnesium oxide (MAG-OX) tablet 400 mg (400 mg Oral Given 08/03/21 1832)  HYDROmorphone (DILAUDID) injection 1 mg (1 mg Intravenous Given 08/03/21 1926)  ondansetron (ZOFRAN) injection 4 mg (4 mg Intravenous Given 08/03/21 1925)  iohexol (OMNIPAQUE) 350 MG/ML injection 100 mL (75 mLs Intravenous Contrast Given 08/03/21 1856)    ED Course/ Medical Decision Making/ A&P                           Medical Decision Making Amount and/or Complexity of Data Reviewed Labs: ordered. Radiology: ordered.  Risk OTC drugs. Prescription drug management.   This patient presents to the ED for concern of abdominal pain nausea vomiting, this involves an extensive number of treatment options, and is a complaint that carries with it a high risk of complications and morbidity.  The differential diagnosis includes ACS, perforated bowel ulcer, diverticulitis, bowel obstruction    Additional history obtained:  Additional history obtained from N/A External records from outside source obtained and reviewed including  previous ER notes, imaging, GI notes   Co morbidities that complicate the patient evaluation  Chronic pain, chronic nausea vomiting,  Social Determinants of Health:  Geriatric    Lab Tests:  I Ordered, and personally interpreted labs.  The pertinent results include: CBC anemia at baseline 11.3, CMP shows sodium 134 potassium 3, glucose 106, albumin 3.1, lactic 0.6   Imaging Studies ordered:  I ordered imaging studies including chest x-ray, dissection study chest abdomen pelvis I independently visualized and interpreted imaging which showed chest x-ray unremarkable, dissection study pending I agree with the radiologist interpretation   Cardiac Monitoring:  The patient was maintained on a cardiac monitor.  I personally viewed and interpreted the cardiac monitored which showed an underlying rhythm of: EKG without signs of ischemia   Medicines ordered and prescription  drug management:  I ordered medication including fluids, antiemetics, pain medication I have reviewed the patients home medicines and have made adjustments as needed  Critical Interventions:  N/A   Reevaluation:  Presents with stomach pain nausea vomiting, she is tachycardic and hypertensive on my exam, epigastric tenderness noted, without a surgical abdomen, I suspect this is likely from frequent nausea and vomiting will obtain basic lab work-up, fluids, antiemetics, pain medication and reassess.  In the patient's potassium is low, likely from emesis, will add on magnesium, she is reassessed states she is feeling much better pain in her back and chest have improved, she is no more nauseous, will attempt p.o. challenge.  Patient opponent's are elevated 46, so endorsing chest pain which goes into her back, I am concerned for dissection, will obtain dissection study for rule out.    Consultations Obtained:  Due to elevated troponins cardiology consultation is pending at this time.    Test  Considered:  N/A    Rule out I have low suspicion for ACS as history is atypical, patient has no cardiac history, EKG was sinus rhythm without signs of ischemia, troponins are elevated but they have plateaued, suspect this is more demand ischemia from elevated heart rates as well as persistent nausea vomiting.  Low suspicion for PE as patient denies pleuritic chest pain, shortness of breath, patient denies leg pain, no pedal edema noted on exam, dissection sending pending.  low suspicion for AAA or aortic dissection as history is atypical, seem more consistent with her chronic nausea and vomiting but imaging is pending at this time.  low suspicion for lower lobe pneumonia as lung sounds are clear bilaterally, will defer imaging at this time.  I have low suspicion for liver or gallbladder abnormality as she has no right upper quadrant tenderness, liver enzymes, alk phos, T bili all within normal limits.  Low suspicion for pancreatitis as lipase is within normal limits.  Low suspicion for ruptured stomach ulcer as she has no peritoneal sign present on exam.  Low suspicion for bowel obstruction as abdomen is nondistended normal bowel sounds, so passing gas and having normal bowel movements.  Low suspicion for complicated diverticulitis as she is nontoxic-appearing, vital signs reassuring no leukocytosis present.  Low suspicion for appendicitis as she has no right lower quadrant tenderness, vital signs reassuring.    Dispostion and problem list  Due to shift change patient with handoff to Margarita Mail, PA-C  Recommend following up on dissection study, consult with cardiology in regards to elevated troponins and reassess the patient, if the pain has improved, cardiology does not feel patient needs to be admitted can be discharged home.   if patient's chest pain continues recommend admission for observation due to elevated troponins.           Final Clinical Impression(s) / ED Diagnoses Final  diagnoses:  Nausea and vomiting, unspecified vomiting type  Atypical chest pain    Rx / DC Orders ED Discharge Orders     None         Marcello Fennel, PA-C 08/03/21 1930    Godfrey Pick, MD 08/08/21 [phone]

## 2021-08-04 ENCOUNTER — Observation Stay (HOSPITAL_COMMUNITY): Payer: Medicare Other

## 2021-08-04 DIAGNOSIS — R0789 Other chest pain: Secondary | ICD-10-CM | POA: Diagnosis not present

## 2021-08-04 DIAGNOSIS — M25551 Pain in right hip: Secondary | ICD-10-CM | POA: Diagnosis present

## 2021-08-04 DIAGNOSIS — G8929 Other chronic pain: Secondary | ICD-10-CM | POA: Diagnosis present

## 2021-08-04 DIAGNOSIS — Z20822 Contact with and (suspected) exposure to covid-19: Secondary | ICD-10-CM | POA: Diagnosis present

## 2021-08-04 DIAGNOSIS — M5431 Sciatica, right side: Secondary | ICD-10-CM | POA: Diagnosis present

## 2021-08-04 DIAGNOSIS — E871 Hypo-osmolality and hyponatremia: Secondary | ICD-10-CM | POA: Diagnosis not present

## 2021-08-04 DIAGNOSIS — D509 Iron deficiency anemia, unspecified: Secondary | ICD-10-CM | POA: Diagnosis present

## 2021-08-04 DIAGNOSIS — D539 Nutritional anemia, unspecified: Secondary | ICD-10-CM | POA: Diagnosis present

## 2021-08-04 DIAGNOSIS — I10 Essential (primary) hypertension: Secondary | ICD-10-CM | POA: Diagnosis present

## 2021-08-04 DIAGNOSIS — R778 Other specified abnormalities of plasma proteins: Secondary | ICD-10-CM | POA: Diagnosis present

## 2021-08-04 DIAGNOSIS — Z9981 Dependence on supplemental oxygen: Secondary | ICD-10-CM | POA: Diagnosis not present

## 2021-08-04 DIAGNOSIS — R1110 Vomiting, unspecified: Secondary | ICD-10-CM | POA: Diagnosis not present

## 2021-08-04 DIAGNOSIS — Z85118 Personal history of other malignant neoplasm of bronchus and lung: Secondary | ICD-10-CM | POA: Diagnosis not present

## 2021-08-04 DIAGNOSIS — R112 Nausea with vomiting, unspecified: Secondary | ICD-10-CM | POA: Diagnosis present

## 2021-08-04 DIAGNOSIS — J440 Chronic obstructive pulmonary disease with acute lower respiratory infection: Secondary | ICD-10-CM | POA: Diagnosis present

## 2021-08-04 DIAGNOSIS — R079 Chest pain, unspecified: Secondary | ICD-10-CM

## 2021-08-04 DIAGNOSIS — R109 Unspecified abdominal pain: Secondary | ICD-10-CM | POA: Diagnosis not present

## 2021-08-04 DIAGNOSIS — K2970 Gastritis, unspecified, without bleeding: Secondary | ICD-10-CM | POA: Diagnosis present

## 2021-08-04 DIAGNOSIS — Y95 Nosocomial condition: Secondary | ICD-10-CM | POA: Diagnosis present

## 2021-08-04 DIAGNOSIS — G4700 Insomnia, unspecified: Secondary | ICD-10-CM | POA: Diagnosis present

## 2021-08-04 DIAGNOSIS — N281 Cyst of kidney, acquired: Secondary | ICD-10-CM | POA: Diagnosis present

## 2021-08-04 DIAGNOSIS — K8689 Other specified diseases of pancreas: Secondary | ICD-10-CM | POA: Diagnosis present

## 2021-08-04 DIAGNOSIS — Z681 Body mass index (BMI) 19 or less, adult: Secondary | ICD-10-CM | POA: Diagnosis not present

## 2021-08-04 DIAGNOSIS — E876 Hypokalemia: Secondary | ICD-10-CM | POA: Diagnosis present

## 2021-08-04 DIAGNOSIS — J189 Pneumonia, unspecified organism: Secondary | ICD-10-CM | POA: Diagnosis present

## 2021-08-04 DIAGNOSIS — K219 Gastro-esophageal reflux disease without esophagitis: Secondary | ICD-10-CM | POA: Diagnosis present

## 2021-08-04 DIAGNOSIS — E441 Mild protein-calorie malnutrition: Secondary | ICD-10-CM | POA: Diagnosis present

## 2021-08-04 DIAGNOSIS — J9611 Chronic respiratory failure with hypoxia: Secondary | ICD-10-CM | POA: Diagnosis present

## 2021-08-04 DIAGNOSIS — F1120 Opioid dependence, uncomplicated: Secondary | ICD-10-CM | POA: Diagnosis present

## 2021-08-04 DIAGNOSIS — I2510 Atherosclerotic heart disease of native coronary artery without angina pectoris: Secondary | ICD-10-CM | POA: Diagnosis present

## 2021-08-04 DIAGNOSIS — J449 Chronic obstructive pulmonary disease, unspecified: Secondary | ICD-10-CM | POA: Diagnosis not present

## 2021-08-04 DIAGNOSIS — F1190 Opioid use, unspecified, uncomplicated: Secondary | ICD-10-CM | POA: Diagnosis not present

## 2021-08-04 LAB — MRSA NEXT GEN BY PCR, NASAL: MRSA by PCR Next Gen: NOT DETECTED

## 2021-08-04 LAB — HEPARIN LEVEL (UNFRACTIONATED): Heparin Unfractionated: <0.1 IU/mL — ABNORMAL LOW (ref 0.30–0.70)

## 2021-08-04 LAB — CBC
HCT: 33.8 % — ABNORMAL LOW (ref 36.0–46.0)
Hemoglobin: 10.2 g/dL — ABNORMAL LOW (ref 12.0–15.0)
MCH: 34.7 pg — ABNORMAL HIGH (ref 26.0–34.0)
MCHC: 30.2 g/dL (ref 30.0–36.0)
MCV: 115 fL — ABNORMAL HIGH (ref 80.0–100.0)
Platelets: 314 10*3/uL (ref 150–400)
RBC: 2.94 MIL/uL — ABNORMAL LOW (ref 3.87–5.11)
RDW: 14 % (ref 11.5–15.5)
WBC: 7.4 10*3/uL (ref 4.0–10.5)
nRBC: 0 % (ref 0.0–0.2)

## 2021-08-04 LAB — TROPONIN I (HIGH SENSITIVITY)
Troponin I (High Sensitivity): 31 ng/L — ABNORMAL HIGH (ref <18)
Troponin I (High Sensitivity): 50 ng/L — ABNORMAL HIGH (ref <18)

## 2021-08-04 LAB — ECHOCARDIOGRAM LIMITED
Calc EF: 58.5 %
Height: 63 in
S' Lateral: 2.5 cm
Single Plane A2C EF: 59.5 %
Single Plane A4C EF: 57.7 %
Weight: 1280 oz

## 2021-08-04 LAB — APTT: aPTT: 30 seconds (ref 24–36)

## 2021-08-04 LAB — MAGNESIUM: Magnesium: 2.1 mg/dL (ref 1.7–2.4)

## 2021-08-04 MED ORDER — POTASSIUM CHLORIDE CRYS ER 20 MEQ PO TBCR
20.0000 meq | EXTENDED_RELEASE_TABLET | Freq: Once | ORAL | Status: AC
  Administered 2021-08-04: 20 meq via ORAL
  Filled 2021-08-04: qty 1

## 2021-08-04 MED ORDER — HYDROCODONE BIT-HOMATROP MBR 5-1.5 MG/5ML PO SOLN
10.0000 mL | Freq: Four times a day (QID) | ORAL | Status: DC | PRN
  Administered 2021-08-05 – 2021-08-07 (×6): 10 mL via ORAL
  Filled 2021-08-04 (×6): qty 10

## 2021-08-04 MED ORDER — HYDROMORPHONE HCL 1 MG/ML IJ SOLN
0.5000 mg | Freq: Once | INTRAMUSCULAR | Status: AC
  Administered 2021-08-04: 0.5 mg via INTRAVENOUS
  Filled 2021-08-04: qty 0.5

## 2021-08-04 MED ORDER — PROCHLORPERAZINE 25 MG RE SUPP
25.0000 mg | Freq: Two times a day (BID) | RECTAL | Status: DC | PRN
Start: 2021-08-04 — End: 2021-08-07
  Administered 2021-08-04 – 2021-08-06 (×3): 25 mg via RECTAL
  Filled 2021-08-04 (×4): qty 1

## 2021-08-04 MED ORDER — HEPARIN SODIUM (PORCINE) 5000 UNIT/ML IJ SOLN
5000.0000 [IU] | Freq: Three times a day (TID) | INTRAMUSCULAR | Status: DC
  Administered 2021-08-04: 5000 [IU] via SUBCUTANEOUS
  Filled 2021-08-04 (×5): qty 1

## 2021-08-04 MED ORDER — HYDRALAZINE HCL 20 MG/ML IJ SOLN: 5.0000 mg | Freq: Four times a day (QID) | INTRAMUSCULAR | Status: DC | PRN

## 2021-08-04 MED ORDER — TIZANIDINE HCL 2 MG PO TABS
6.0000 mg | ORAL_TABLET | Freq: Two times a day (BID) | ORAL | Status: DC | PRN
  Administered 2021-08-04 (×2): 6 mg via ORAL
  Filled 2021-08-04 (×2): qty 3

## 2021-08-04 MED ORDER — ATORVASTATIN CALCIUM 40 MG PO TABS
40.0000 mg | ORAL_TABLET | Freq: Every day | ORAL | Status: DC
  Administered 2021-08-05 – 2021-08-07 (×3): 40 mg via ORAL
  Filled 2021-08-04 (×3): qty 1

## 2021-08-04 NOTE — Progress Notes (Signed)
**Note De-Identified  Obfuscation** EKG completed and placed in patient chart

## 2021-08-04 NOTE — TOC Progression Note (Signed)
  Transition of Care [facility]) Screening Note   Patient Details  Name: Jasmine [name] Date of Birth: 1956/02/24   Transition of Care [facility]) CM/SW Contact:    Shade Flood, LCSW Phone Number: 08/04/2021, 10:47 AM    Transition of Care Department [facility]) has reviewed patient and no TOC needs have been identified at this time. We will continue to monitor patient advancement through interdisciplinary progression rounds. If new patient transition needs arise, please place a TOC consult.

## 2021-08-04 NOTE — Consult Note (Addendum)
Cardiology Consultation:   Patient ID: Jasmine [name] MRN: [id number]; DOB: 12/17/1955  Admit date: 08/03/2021 Date of Consult: 08/04/2021  PCP:  Janora Norlander, DO   [facility] Cardiologist:  New to [facility]   Patient Profile:   Jasmine [name] is a 66 y.o. female with a hx of COPD, chronic hypoxic respiratory failure, lung cancer (s/p RUL lobectomy), HTN, HLD, malnutrition, pancreatic insufficiency and chronic pain who is being seen 08/04/2021 for the evaluation of chest pain and elevated troponin values at the request of Dr. Denton Brick.  History of Present Illness:   Jasmine [name] was most recently admitted to [facility] in 06/2021 for abdominal pain with associated vomiting and she was noted to have a dilated common bile duct but this was felt to be chronic per GI evaluation. An outpatient EGD was recommended for further evaluation.  She presented back to the ED on 08/03/2021 for evaluation of abdominal pain, nausea, vomiting and diarrhea which started several days prior to arrival.  Reported overall poor oral intake and also reported episodes of chest discomfort.  In talking with the patient today, she reports having chronic nausea and vomiting but symptoms acutely worsened this past Sunday and she reports worsening abdominal pain at that time as well. She was unable to keep down clear liquids.  Says that she developed a discomfort along her sternal/epigastric region as well which would radiate along her ribs bilaterally. Says this discomfort was worse with inspiration and was not associated with exertion or positional changes. Reports her pain has improved since being started on antibiotics. No recent fever or chills.  No reported orthopnea, PND or pitting edema. She does use 4 L [place] at baseline but was using 5 L for several days prior to admission.   Initial labs showed WBC 7.4, Hgb 11.3, platelets 376, Na+ 134, K+ 3.0 and creatinine 0.56.  AST 23 and ALT 15 with albumin at 3.1.  Lactic Acid 0.6. Lipase 18. Initial and repeat Hs Troponin values flat at 46, 53 and 50.  CXR showed COPD with no acute changes. CTA showed interval development of a nodular-like consolidation within the right lower lobe likely representing infection but underlying malignancy cannot be excluded but less likely given appearance of the lobe 1 month prior. Was also noted to have an enlarged common bile duct with associated persistent gallbladder wall thickening, and indeterminate 1.1 cm left renal lesion which may represent a renal cyst or solid lesion. She was also noted to have aortic atherosclerosis and two-vessel coronary artery calcification. EKG showed sinus tachycardia, HR 115 with no acute ST changes.   Given her elevated troponin values, her case was reviewed with Dr. Virgina Jock who was on call at [facility] and she was started on Heparin. She has been started on Azithromycin and Rocephin for her PNA.    Past Medical History:  Diagnosis Date   Allergy    Anemia    Anxiety    Arthritis    Asthma    Carpal tunnel syndrome    Chronic back pain    Cigarette smoker 06/12/8117   Complication of anesthesia    in the past has had N/V, the last few surgeries she has not been   COPD (chronic obstructive pulmonary disease) ([place])    Easy bruising 07/10/2016   GERD (gastroesophageal reflux disease)    Headache    Heart murmur 2005   never had any problems   History of blood transfusion 2018   History of  kidney stones    Hypertension    Hypoglycemia    Hypoglycemia    Iron deficiency anemia 07/11/2016   Neuropathy    both arms   Normocytic anemia 07/29/2015   Pancreatitis    Pneumonia    PONV (postoperative nausea and vomiting)    Postoperative anemia due to acute blood loss 11/15/2015   R lung cancer    Sciatica    Seizures ([place])    as a child when she would have an asthma attack. none as an adult   Shortness of breath dyspnea     Past Surgical History:  Procedure Laterality Date    ABDOMINAL HYSTERECTOMY  0175   APPLICATION OF ROBOTIC ASSISTANCE FOR SPINAL PROCEDURE N/A 09/05/2016   Procedure: APPLICATION OF ROBOTIC ASSISTANCE FOR SPINAL PROCEDURE;  Surgeon: Consuella Lose, MD;  Location: [place];  Service: Neurosurgery;  Laterality: N/A;   BACK SURGERY     BIOPSY  06/14/2020   Procedure: BIOPSY;  Surgeon: Daneil Dolin, MD;  [facility];  Service: Endoscopy;;   [place]   COLONOSCOPY     unsure last   COLONOSCOPY WITH PROPOFOL N/A 06/14/2020   Procedure: COLONOSCOPY WITH PROPOFOL;  Surgeon: Daneil Dolin, MD;  [facility];  Service: Endoscopy;  Laterality: N/A;  PM   cyst removal face     disk repair     neck and lumbar region   ESOPHAGOGASTRODUODENOSCOPY (EGD) WITH PROPOFOL N/A 06/14/2020   Procedure: ESOPHAGOGASTRODUODENOSCOPY (EGD) WITH PROPOFOL;  Surgeon: Daneil Dolin, MD;  [facility];  Service: Endoscopy;  Laterality: N/A;   FOOT SURGERY Bilateral    to file bones down    HARDWARE REMOVAL N/A 09/05/2016   Procedure: HARDWARE REMOVAL AND REPLACEMENT OF LUMBAR FIVE SCREWS;  Surgeon: Consuella Lose, MD;  Location: [place];  Service: Neurosurgery;  Laterality: N/A;   IMPLANTATION / PLACEMENT EPIDURAL NEUROSTIMULATOR ELECTRODES     INTERCOSTAL NERVE BLOCK Right 07/29/2020   Procedure: INTERCOSTAL NERVE BLOCK;  Surgeon: Melrose Nakayama, MD;  Location: [place];  Service: Thoracic;  Laterality: Right;   LAMINECTOMY     2006   lobectomy Right 07/2020   LUMBAR FUSION     LYMPH NODE BIOPSY Right 07/29/2020   Procedure: LYMPH NODE BIOPSY;  Surgeon: Melrose Nakayama, MD;  Location: [place];  Service: Thoracic;  Laterality: Right;   POLYPECTOMY  06/14/2020   Procedure: POLYPECTOMY INTESTINAL;  Surgeon: Daneil Dolin, MD;  [facility];  Service: Endoscopy;;   SPINAL CORD STIMULATOR BATTERY EXCHANGE     SPINAL CORD STIMULATOR INSERTION N/A 07/05/2016   Procedure:  REPLACEMENT OF LUMBAR SPINAL CORD STIMULATOR BATTERY;  Surgeon: Newman Pies, MD;  Location: [place];  Service: Neurosurgery;  Laterality: N/A;   TONSILLECTOMY       Home Medications:  Prior to Admission medications   Medication Sig Start Date End Date Taking? Authorizing Provider  albuterol (VENTOLIN HFA) 108 (90 Base) MCG/ACT inhaler USE 2 INHALATIONS EVERY 6 HOURS AS NEEDED FOR WHEEZING OR SHORTNESS OF BREATH (NEED TO BE SEEN BEFORE NEXT REFILL) Patient taking differently: Inhale 2 puffs into the lungs every 6 (six) hours as needed for wheezing or shortness of breath. 03/30/21  Yes Gottschalk, Ashly M, DO  amLODipine (NORVASC) 10 MG tablet Take 1 tablet (10 mg total) by mouth daily. 03/15/21  Yes Johnson, Clanford L, MD  atorvastatin (LIPITOR) 40 MG tablet Take 1 tablet (40  mg total) by mouth daily. 07/09/20  Yes Gottschalk, Leatrice Jewels M, DO  budesonide-formoterol (SYMBICORT) 160-4.5 MCG/ACT inhaler USE 2 INHALATIONS TWICE A DAY 01/17/21  Yes Ronnie Doss M, DO  Cholecalciferol 125 MCG (5000 UT) capsule Take 5,000 Units by mouth daily.   Yes [provider]  dextromethorphan (DELSYM) 30 MG/5ML liquid Take 15 mg by mouth as needed for cough.   Yes [provider]  dextromethorphan-guaiFENesin (MUCINEX DM) 30-600 MG 12hr tablet Take 1 tablet by mouth 2 (two) times daily.   Yes [provider]  DULoxetine (CYMBALTA) 30 MG capsule Take 3 capsules (90 mg total) by mouth daily. Patient taking differently: Take 30 mg by mouth at bedtime. 07/09/20  Yes Janora Norlander, DO  lipase/protease/amylase 24000-76000 units CPEP Take 2 caps daily with meals and 1 cap with snacks 07/03/21  Yes Johnson, Clanford L, MD  metoprolol tartrate (LOPRESSOR) 25 MG tablet Take 0.5 tablets (12.5 mg total) by mouth 2 (two) times daily. 07/03/21  Yes Johnson, Clanford L, MD  mirtazapine (REMERON) 7.5 MG tablet Take 1 tablet (7.5 mg total) by mouth at bedtime. Cross taper with 30mg  of Cymbalta for 3-4  weeks. Then stop cymbalta 07/11/21  Yes Gottschalk, Ashly M, DO  Nutritional Supplements (ENSURE HIGH PROTEIN) LIQD Drink 1 can 3 times daily. 08/30/20  Yes Ronnie Doss M, DO  omeprazole (PRILOSEC) 40 MG capsule Take one capsule once to twice daily before a meal. 01/18/21  Yes Mahala Menghini, PA-C  oxybutynin (DITROPAN-XL) 5 MG 24 hr tablet TAKE 1 TABLET(5 MG) BY MOUTH AT BEDTIME Patient taking differently: Take 5 mg by mouth at bedtime. 05/20/21  Yes Martin, Mary-Margaret, FNP  oxyCODONE-acetaminophen (PERCOCET) 10-325 MG tablet Take 1 tablet by mouth 5 (five) times daily. 08/27/20  Yes [provider]  OXYCONTIN 15 MG 12 hr tablet Take 15 mg by mouth every 12 (twelve) hours. 08/28/20  Yes [provider]  OXYGEN Inhale 4 L into the lungs See admin instructions. continuous   Yes [provider]  polyethylene glycol (MIRALAX) 17 g packet Take 17 g by mouth daily. 03/14/21  Yes Johnson, Clanford L, MD  promethazine (PHENERGAN) 25 MG suppository Place 1 suppository (25 mg total) rectally every 6 (six) hours as needed for nausea or vomiting. 06/07/21  Yes Gottschalk, Ashly M, DO  SPIRIVA RESPIMAT 2.5 MCG/ACT AERS USE 2 INHALATIONS DAILY Patient taking differently: Inhale 2 puffs into the lungs daily. 07/13/20  Yes Tanda Rockers, MD  tizanidine (ZANAFLEX) 6 MG capsule Take 1 capsule (6 mg total) by mouth 3 (three) times daily as needed for muscle spasms. Patient taking differently: Take 6 mg by mouth 2 (two) times daily as needed for muscle spasms. 07/03/21  Yes Johnson, Clanford L, MD  traZODone (DESYREL) 150 MG tablet TAKE 1 TO 2 TABLETS AT BEDTIME Patient taking differently: Take 150 mg by mouth at bedtime. 03/30/21  Yes Janora Norlander, DO    Inpatient Medications: Scheduled Meds:  amLODipine  10 mg Oral Daily   [START ON 08/05/2021] atorvastatin  40 mg Oral Daily   dextromethorphan-guaiFENesin  1 tablet Oral BID   DULoxetine  30 mg Oral QHS   lipase/protease/amylase   24,000-72,000 Units Oral TID AC   metoprolol tartrate  12.5 mg Oral BID   mirtazapine  7.5 mg Oral QHS   mometasone-formoterol  2 puff Inhalation BID   oxybutynin  5 mg Oral QHS   oxyCODONE  15 mg Oral Q12H   pantoprazole (PROTONIX) IV  40  mg Intravenous Q24H   Ensure Max Protein  1 Container Oral TID   traZODone  150 mg Oral QHS   umeclidinium bromide  1 puff Inhalation Daily   Continuous Infusions:  0.9 % NaCl with KCl 40 mEq / L 100 mL/hr at 08/04/21 0917   azithromycin 500 mg (08/04/21 0047)   cefTRIAXone (ROCEPHIN)  IV 2 g (08/04/21 0914)   heparin 450 Units/hr (08/03/21 2102)   PRN Meds: acetaminophen **OR** acetaminophen, diphenhydrAMINE, ipratropium-albuterol, ondansetron **OR** ondansetron (ZOFRAN) IV, oxyCODONE-acetaminophen **AND** oxyCODONE, polyethylene glycol, tiZANidine  Allergies:    Allergies  Allergen Reactions   Lyrica [Pregabalin] Other (See Comments)    EXTREME SHAKING/TREMBLING   Darvon [Propoxyphene]     UNSPECIFIED REACTION    Gabapentin     Extreme shaking and trembling    Augmentin [Amoxicillin-Pot Clavulanate] Nausea And Vomiting    Has patient had a PCN reaction causing immediate rash, facial/tongue/throat swelling, SOB or lightheadedness with hypotension:no Has patient had a PCN reaction causing severe rash involving mucus membranes or skin necrosis:No Has patient had a PCN reaction that required hospitalization:No Has patient had a PCN reaction occurring within the last 10 years:Yes--ONLY N/V If all of the above answers are "NO", then may proceed with Cephalosporin use.    Erythromycin Itching and Rash        Vibramycin [Doxycycline Calcium] Itching and Rash    "SPLOTCHING "    Social History:   Social History   Socioeconomic History   Marital status: Married    Spouse name: [name]   Number of children: 1   Years of education: Not on file   Highest education level: Some college, no degree  Occupational History   Occupation: disability   Tobacco Use   Smoking status: Every Day    Packs/day: 0.25    Years: 35.00    Pack years: 8.75    Types: Cigarettes   Smokeless tobacco: Never   Tobacco comments:    trying to quit  Vaping Use   Vaping Use: Never used  Substance and Sexual Activity   Alcohol use: Yes    Alcohol/week: 2.0 standard drinks    Types: 2 Glasses of wine per week    Comment: occasionally   Drug use: No   Sexual activity: Yes    Birth control/protection: None  Other Topics Concern   Not on file  Social History Narrative   Retired, 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 1 son, deceased. 2 pet dogs. Enjoys watching TV.   Husband takes care of cooking, cleaning, driving, and helps her with ADLs including bathing   Social Determinants of Health   Financial Resource Strain: Low Risk    Difficulty of Paying Living Expenses: Not very hard  Food Insecurity: No Food Insecurity   Worried About Charity fundraiser in the Last Year: Never true   Ran Out of Food in the Last Year: Never true  Transportation Needs: No Transportation Needs   Lack of Transportation (Medical): No   Lack of Transportation (Non-Medical): No  Physical Activity: Insufficiently Active   Days of Exercise per Week: 7 days   Minutes of Exercise per Session: 10 min  Stress: No Stress Concern Present   Feeling of Stress : Not at all  Social Connections: Socially Integrated   Frequency of Communication with Friends and Family: More than three times a week   Frequency of Social Gatherings with Friends and Family: More than three times a week  Attends Religious Services: 1 to 4 times per year   Active Member of Clubs or Organizations: Yes   Attends Archivist Meetings: 1 to 4 times per year   Marital Status: Married  Human resources officer Violence: Not At Risk   Fear of Current or Ex-Partner: No   Emotionally Abused: No   Physically Abused: No   Sexually Abused: No    Family History:    Family History  Problem  Relation Age of Onset   Heart disease Mother    Diabetes Mother    Hypertension Mother    Hyperlipidemia Mother    COPD Mother        smoked   Cancer Mother        bile duct   COPD Father    Diabetes Father    Heart disease Father    Cancer Father        throat   Dementia Father    Asthma Son    Hypertension Sister    Hyperlipidemia Sister    Hypertension Brother    Hyperlipidemia Brother    Hypertension Brother    Hyperlipidemia Brother    Emphysema Maternal Grandmother        smoked   Colon cancer Neg Hx    Colon polyps Neg Hx      ROS:  Please see the history of present illness.   All other ROS reviewed and negative.     Physical Exam/Data:   Vitals:   08/03/21 2251 08/03/21 2328 08/04/21 0339 08/04/21 0743  BP:  (!) 187/100 129/78   Pulse:  100 (!) 106   Resp:  18 18   Temp: 98.3 F (36.8 C) 98.7 F (37.1 C) 98 F (36.7 C)   TempSrc: Oral Oral    SpO2:  100% 100% 98%  Weight:      Height:        Intake/Output Summary (Last 24 hours) at 08/04/2021 1048 Last data filed at 08/04/2021 0917 Gross per 24 hour  Intake 336.54 ml  Output 1700 ml  Net -1363.46 ml      08/03/2021    1:14 PM 07/12/2021    1:57 PM 07/11/2021   12:54 PM  Last 3 Weights  Weight (lbs) 80 lb 81 lb 82 lb 3.2 oz  Weight (kg) 36.288 kg 36.741 kg 37.286 kg     Body mass index is 14.17 kg/m.  General: Frail, thin female appearing in no acute distress.  HEENT: normal Neck: no JVD Vascular: No carotid bruits; Distal pulses 2+ bilaterally Cardiac:  normal S1, S2; RRR; Lungs: crackles along right lung field. No wheezing.  Abd: soft, nontender, no hepatomegaly  Ext: no pitting edema Musculoskeletal:  No deformities, BUE and BLE strength normal and equal Skin: warm and dry  Neuro:  CNs 2-12 intact, no focal abnormalities noted Psych:  Normal affect   EKG:  The EKG was personally reviewed and demonstrates: Sinus tachycardia, HR 115 with no acute ST changes.   Telemetry:  Telemetry was  personally reviewed and demonstrates: Sinus rhythm with HR in 80's to 90's and occasionally tachycardia into the low-100's. Appears most consistent with sinus tach.   Relevant CV Studies:  Echo Complete: 05/2020 IMPRESSIONS     1. Left ventricular ejection fraction, by estimation, is 60 to 65%. The  left ventricle has normal function. The left ventricle has no regional  wall motion abnormalities. Left ventricular diastolic parameters are  consistent with Grade I diastolic  dysfunction (impaired relaxation).  2. Right ventricular systolic function is normal. The right ventricular  size is mildly enlarged.   3. The mitral valve is normal in structure. Mild mitral valve  regurgitation. No evidence of mitral stenosis.   4. The aortic valve has an indeterminant number of cusps. Aortic valve  regurgitation is not visualized. No aortic stenosis is present.   5. Indeterminant PASP, inadequate TR jet.   6. The inferior vena cava is normal in size with greater than 50%  respiratory variability, suggesting right atrial pressure of 3 mmHg.  Limited Echo: 11/2020 IMPRESSIONS     1. Left ventricular endocardial border not optimally defined to evaluate  regional wall motion. Left ventricular diastolic parameters are consistent  with Grade I diastolic dysfunction (impaired relaxation).   2. The mitral valve was not well visualized. No evidence of mitral valve  regurgitation. No evidence of mitral stenosis.   3. The aortic valve was not well visualized. Aortic valve regurgitation  is not visualized. No aortic stenosis is present.   4. Tricuspid regurgitation signal is inadequate for assessing PA  pressure.   5. The inferior vena cava is normal in size with greater than 50%  respiratory variability, suggesting right atrial pressure of 3 mmHg.   6. Limited echo for chest pain   Laboratory Data:  High Sensitivity Troponin:   Recent Labs  Lab 08/03/21 1459 08/03/21 1746 08/03/21 2352  08/04/21 0909  TROPONINIHS 46* 53* 50* 31*     Chemistry Recent Labs  Lab 08/03/21 1426 08/03/21 1519 08/03/21 1947 08/04/21 0434  NA 134*  --   --   --   K 3.0*  --   --   --   CL 98  --   --   --   CO2 23  --   --   --   GLUCOSE 106*  --   --   --   BUN 9  --   --   --   CREATININE 0.56  --   --   --   CALCIUM 8.9  --   --   --   MG  --  1.6* 1.5* 2.1  GFRNONAA >60  --   --   --   ANIONGAP 13  --   --   --     Recent Labs  Lab 08/03/21 1426  PROT 7.2  ALBUMIN 3.1*  AST 23  ALT 15  ALKPHOS 122  BILITOT 1.2   Lipids No results for input(s): CHOL, TRIG, HDL, LABVLDL, LDLCALC, CHOLHDL in the last 168 hours.  Hematology Recent Labs  Lab 08/03/21 1426 08/04/21 0434  WBC 7.4 7.4  RBC 3.30* 2.94*  HGB 11.3* 10.2*  HCT 37.0 33.8*  MCV 112.1* 115.0*  MCH 34.2* 34.7*  MCHC 30.5 30.2  RDW 14.3 14.0  PLT 376 314   Thyroid No results for input(s): TSH, FREET4 in the last 168 hours.  BNPNo results for input(s): BNP, PROBNP in the last 168 hours.  DDimer No results for input(s): DDIMER in the last 168 hours.   Radiology/Studies:  DG Chest Portable 1 View  Result Date: 08/03/2021 CLINICAL DATA:  Chest pain.  COPD EXAM: PORTABLE CHEST 1 VIEW COMPARISON:  07/12/2021 FINDINGS: COPD with hyperinflation and pulmonary scarring. Postsurgical changes and scarring in the right upper lobe. Blunting right costophrenic angle unchanged compatible with scarring from surgery. Spinal cord stimulator midthoracic spine unchanged No acute infiltrate.  No heart failure or edema. IMPRESSION: COPD and scarring with postoperative changes on the  right. No acute cardiopulmonary process. Electronically Signed   By: Franchot Gallo M.D.   On: 08/03/2021 15:21   CT Angio Chest/Abd/Pel for Dissection W and/or Wo Contrast  Result Date: 08/03/2021 CLINICAL DATA:  Acute aortic syndrome (AAS) suspected EXAM: CT ANGIOGRAPHY CHEST, ABDOMEN AND PELVIS TECHNIQUE: Non-contrast CT of the chest was initially  obtained. Multidetector CT imaging through the chest, abdomen and pelvis was performed using the standard protocol during bolus administration of intravenous contrast. Multiplanar reconstructed images and MIPs were obtained and reviewed to evaluate the vascular anatomy. RADIATION DOSE REDUCTION: This exam was performed according to the departmental dose-optimization program which includes automated exposure control, adjustment of the mA and/or kV according to patient size and/or use of iterative reconstruction technique. CONTRAST:  47mL OMNIPAQUE IOHEXOL 350 MG/ML SOLN COMPARISON:  CT abdomen pelvis 03/11/2021, ultrasound abdomen 06/29/2021, CT chest 06/27/2021 FINDINGS: CTA CHEST FINDINGS Cardiovascular: Normal heart size. No significant pericardial effusion. The thoracic aorta is normal in caliber. At least moderate atherosclerotic plaque of the thoracic aorta. At least 2 vessel coronary artery calcifications. The main pulmonary artery is normal in caliber. Enlarged right main pulmonary artery. No central or segmental pulmonary embolus. Mediastinum/Nodes: No enlarged mediastinal, hilar, or axillary lymph nodes. Subcentimeter right thyroid gland hypodensity. Not clinically significant; no follow-up imaging recommended (ref: J Am Coll Radiol. 2015 Feb;12(2): 143-50). Trachea and esophagus demonstrate no significant findings. Lungs/Pleura: Severe centrilobular emphysematous changes. Surgical changes related to right upper lobectomy. Chronic expansion of the right lower lobe. Interval development of a 1.2 x 1 2.6 cm nodular like consolidation within the right lower lobe (7:40). Interval increase in partial collapse of the right middle lobe with patchy airspace opacity. No pulmonary nodule. No pulmonary mass. No pleural effusion. No pneumothorax. Musculoskeletal: No chest wall abnormality. No suspicious lytic or blastic osseous lesions. No acute displaced fracture. Multilevel degenerative changes of the spine. Neural  stimulator leads terminate in the posterior central canal at the T6 through T7 levels. Review of the MIP images confirms the above findings. CTA ABDOMEN AND PELVIS FINDINGS VASCULAR Aorta: Severe atherosclerotic plaque. Normal caliber aorta without aneurysm, dissection, vasculitis or significant stenosis. Celiac: Patent without evidence of aneurysm, dissection, vasculitis or significant stenosis. SMA: Patent without evidence of aneurysm, dissection, vasculitis or significant stenosis. Renals: Both renal arteries are patent without evidence of aneurysm, dissection, vasculitis, fibromuscular dysplasia or significant stenosis. IMA: Patent without evidence of aneurysm, dissection, vasculitis or significant stenosis. Inflow: Atherosclerotic plaque. Patent without evidence of aneurysm, dissection, vasculitis or significant stenosis. Veins: No obvious venous abnormality within the limitations of this arterial phase study. Review of the MIP images confirms the above findings. NON-VASCULAR Hepatobiliary: No focal liver abnormality. No CT evidence of gallstones. Persistent gallbladder wall thickening. No definite pericholecystic fluid. Enlarged common bile duct measuring up to 1.7 cm. Pancreas: No focal lesion. Normal pancreatic contour. No surrounding inflammatory changes. No main pancreatic ductal dilatation. Spleen: Normal in size without focal abnormality. Adrenals/Urinary Tract: No adrenal nodule bilaterally. Bilateral kidneys enhance symmetrically. Subcentimeter hypodensities bilaterally are too small to characterize. Pericentimeter fluid density lesions within bilateral kidneys statistically likely represent simple renal cysts. Simple renal cysts, in the absence of clinically indicated signs/symptoms, require no independent follow-up. There is a 1.1 cm hypodense lesion within the left kidney with a density of 29 Hounsfield units (5:108). No hydronephrosis. No hydroureter. The urinary bladder is unremarkable.  Stomach/Bowel: Stomach is within normal limits. No evidence of bowel wall thickening or dilatation. The appendix is not definitely identified with no inflammatory  changes in the right lower quadrant to suggest acute appendicitis. Lymphatic: Slightly limited evaluation of the to peritoneum due to streak artifact originating from the lumbar surgical hardware. No lymphadenopathy. Reproductive: Status post hysterectomy. No adnexal masses. Other: No intraperitoneal free fluid. No intraperitoneal free gas. No organized fluid collection. Musculoskeletal: No abdominal wall hernia or abnormality. Diffusely decreased bone density. No suspicious lytic or blastic osseous lesions. No acute displaced fracture. Status post L3-L5 posterolateral and interbody fusion. Multilevel degenerative changes of the spine. Review of the MIP images confirms the above findings. IMPRESSION: 1. No aorta aneurysm or dissection. Aortic aneurysm NOS (ICD10-I71.9). 2. Interval development of a 1.2 x 1 2.6 cm nodular-like consolidation within the right lower lobe. Finding likely represents infection. Underlying malignancy not fully excluded but less likely given appearance of the right upper lobe 1 month ago. Additional imaging evaluation or consultation with Pulmonology or Thoracic Surgery recommended. 3. Interval increase in partial collapse and patchy airspace opacity of the right middle lobe. Finding may represent a combination of atelectasis versus infection/inflammation. 4. Status post right upper lobectomy. 5. Interval development of a 1.2 x 1 2.6 cm nodular like consolidation within the right lower lobe. 6. Enlarged common bile duct with associated persistent nonspecific gallbladder wall thickening. Gallbladder wall thickening can be seen in the setting of chronic liver disease versus acute cholecystitis. Correlate with liver function tests. 7. Indeterminate 1.1 cm left renal lesion. Finding may represent a minimally complex renal cyst versus  solid renal lesion. Consider nonemergent further evaluation with MRI or CT renal protocol. 8. Aortic Atherosclerosis (ICD10-I70.0). Electronically Signed   By: Iven Finn M.D.   On: 08/03/2021 19:24     Assessment and Plan:   1. Atypical Chest Pain/Elevated Troponin Values - Her chest pain overall seems most consistent with a pleuritic pain as it is worse with inspiration and she reports symptoms are now starting to improve. There has been no association with exertion. - Initial and repeat Hs Troponin values have been flat at 46, 53 and 50 which is likely secondary to demand ischemia in the setting of hypoxic respiratory failure and pneumonia. Overall, not consistent with ACS. EKG without acute ST changes. - Will plan to obtain a limited echocardiogram for reassessment of her EF and wall motion. If this is reassuring, can likely discontinue Heparin. She does have coronary calcifications by CT and is on Atorvastatin 40mg  daily as an outpatient. Will reorder.   2. COPD/Chronic Hypoxic Respiratory Failure/PNA - She is on 4L [place] at baseline. CT on admission concerning for PNA and she has been started on antibiotic therapy. Management per the admitting team.   3. Nausea/Vomiting/Abdominal Pain/Malnutrition - Chronic per the patient's report and possibly due to pancreatic insufficiency/chronic pancreatitis per prior GI notes. She is malnourished on examination and her BMI is at 14 with Albumin at 3.1.  4. HTN - BP was initially elevated but improved to 129/78 on most recent check. She has been continued on PTA Amlodipine 10mg  daily and Lopressor 12.5mg  BID.   5. Hypokalemia/Hyponatremia - K+ was low at 3.0 on admission and supplementation was ordered then. Na+ also low at 134. Recheck with AM labs.   For questions or updates, please contact [place] Please consult [email or web address] for contact info under    Signed, Erma Heritage, PA-C  08/04/2021 10:48 AM   Patient seen and  discussed with PA Ahmed Prima, I agree with her documentation. 66 yo female history of COPD on 4L [place], lung CA s/p RUL lobectomy,  malnutrition BMI 14, presented with vomiting and loose stools x 4 days. From notes 8-10 episodes of vomiting and 5-6 loose stools per day. Also reports onset of recent chest pains. Constant left sided pain since Sunday worst with deep breathing.     ER vitals: p130 bp 170/104 98% RA WBC 7.4 Hgb 11.3 Plt 376 K 3 BUN 9 Cr 0.56 Lactic acid 0.6 Mg 1.6  Trop 46-->53-->50-->31 CXR COPD and scarring, no acute process CTA: no dissection or aneurysm. RUL consolidation, RML opacity EKG sinus tach  05/2020 echo: LVEF 60-65%, no WMAs, grade I dd      Mild trop elevation in setting of HTN, tachycardia, N/V/D, pneumonia.  EKG without acute ischemic changes, echo pending. Pain is atypical in the sense constant since Sunday and worst with deep breathing, likely pleuritic in setting of pneumonia, also numerous vomiting episodes may have led to some chest discomfort. At this time do not suspect primary cardiac component, can d/c heparin. F/u echo, if no significant findings no additional cardiology recs and cardiology would sign off.  Addendum 1220pm Echo with LVEF 60-65%, no WMAs. Normal RV function, no pericardial effusion. We will sign off inpatient care. Does not require cardiology f/u.    Carlyle Dolly MD

## 2021-08-04 NOTE — Progress Notes (Signed)
*  PRELIMINARY RESULTS* Echocardiogram Limited 2D Echocardiogram has been performed.  Jasmine [name] 08/04/2021, 11:22 AM

## 2021-08-04 NOTE — Progress Notes (Addendum)
PROGRESS NOTE                                                                                                                                                                                                             Patient Demographics:    Jasmine [name], is a 66 y.o. female, DOB - Aug 29, 1955, HFW:[id number]  Admit date - 08/03/2021   Admitting Physician Ejiroghene Arlyce Dice, MD  Outpatient Primary MD for the patient is Janora Norlander, DO  LOS - 0  Outpatient Specialists: Dr. Sydell Axon (GI) Dr. Tera Helper (oncologist)  Chief Complaint  Patient presents with   Abdominal Pain       Brief Narrative  Jasmine [name] is a 66 y.o. female with medical history significant for  COPD, chronic hypoxic respiratory failure on 4 L, lung cancer status post right upper lobectomy, protein calorie malnutrition, and chronic pain.  Patient presented to the ED with complaints of multiple episodes of vomiting and loose stools of 4 days duration.  She reports average of 8-10 episodes of vomiting daily, and 5-6 episodes of diarrhea also.  She reports poor oral intake in the past 4 days.  She reports onset of chest pain with her GI symptoms.  Chest pain is bilateral lower ribs and to the side, constant, and hurts when she breathes and coughs.  She reports mid abdominal pain.  She denies black stools.  She denies NSAID use and uses Tylenol and oxycodone/OxyContin.  She denies known cardiac history.  She reports chronic productive cough that is worse today.  She has chronic difficulty breathing, that is unchanged.  Patient has a history of chronic nausea.  She reports today reports vomiting and diarrhea have significantly reduced in frequency.  Hospitalization 4/25 to 4/30 also for abdominal pain and vomiting.  She has chronic CBD dilatation, MRI could not be obtained due to implanted stimulator.  GI recommended pancreatic enzymes, and that her CBD had  been dilated for years.   ED Course: Temperature 97.9.  Heart rate 101-130.  Respiratory rate 13 -18.  Blood pressure 027 -741 systolic.  O2 sats greater than 98% on room air.  Troponin 46 > 53.  Lactic acid 0.8.  Potassium 3.  Magnesium 1.6.  EKG without changes.  ED provider talked to cardiologist Dr. Virgina Jock, recommended starting heparin drip, admit to [facility].  I evaluated patient and felt admission to [facility] was not needed at this time, sent a secure chat message to Dr. Virgina Jock, we agreed that patient can stay here for now.   Subjective:    Jasmine [name] today has continued chest discomfort.  " Throbbing, and tightness" worse with inspiration.  Pt notes cough (nonproductive), and dyspnea but not worse than baseline.  Pt denies fever, chills, n/v, diarrhea.  Slight abdominal discomfort, but this has improved as well.  Her diarrhea (started 5/28,having 6x per day) resolved 2 days ago. Her n/v (started 5/28, 6-8 x per day, not improved with phenergan supp, protonix), resolved yesterday.  Pt overall feeling better.  Pt denies fever, chills, lower ext edema. Pt has not tried breakfast yet.  Pt notes that the pancrealipase has helped some with her GI symptoms. Pt notes that she is able to tolerate rocephin / zithromax (only allerghic to the older version) per pt.    Dr. Jani Gravel saw and evaluated pt on 08/04/21 -Dr. Jani Gravel formulated plan of care for this patient-    Assessment  & Plan :    Principal Problem:   PNA (pneumonia) Active Problems:   HTN (hypertension)   Chronic narcotic use   COPD GOLD II    Chronic respiratory failure with hypoxia (HCC)   Chest pain   Abdominal pain with vomiting   RM/RLL Pneumonia/ HCAP Blood cultures x2 pending Check urine legionella antigen Received Vanco/ Cefepime x1 in ED Cont Rocephin/ Zithromax D#2 -MRSA screen is  negative okay to discontinue vancomycin  N/v, Abdominal pain/ Diarrhea ddx gastritis, gerd,  pancreatic  insufficiency Chronic CBD dilation, unable to get MRI due to spinal stimulator C. Diff pending Cont Creon Cont protonix 40mg  iv qday Cont Zofran prn  CP, atypical/ Elevated Trop  + coronary artery calcifications 2V on CTA chest 5/31 46>53> 50 Cont Trend Troponin Cont Heparin iv for now Cardiology consult  placed   Chronic Respiratory Failure with hypoxia/ Copd on home o2 Cont Symbicort -> Dulera 2puff bid Cont Ventolin HFA-> Duoneb 1 q4h prn Cont Mucinex 1 po bid   Chronic narcotic use Chronic back pain w sciatica Chronic right hip pain Cont Oxycontin 15mg  po bid Cont oxycodone 10mg  po q4h prn  Cont Cymbalta 30mg  po qhs  Hypertension Cont Metoprolol 12.5mg  po bid Cont Norvasc 10mg  po qday  Hypokalemia Replete w Kdur 53meq x1 Check cmp in am  Macrocytic anemia, hx of iron deficiency Check b12, folate, ferritin, iron, tibc in am Check cbc in am  Insomnia Cont Remeron 7.5mg  po qhs Cont Trazodone 150mg  po qhs  1.1cm L renal lesion indeterminate Outpatient follow up recommended w MRI or CT  Dr. Jani Gravel saw and evaluated pt on 08/04/21 -Dr. Jani Gravel formulated plan of care for this patient-  Code Status : Full CODE  Family Communication  :  attempted to update husband at [phone] but no response  Disposition Plan  : home  Barriers For Discharge :  none  Consults  :  cardiology  Procedures  :  CTA chest 5/31  DVT Prophylaxis  :  Heparin iv  Lab Results  Component Value Date   PLT 314 08/04/2021    Antibiotics  :   Vanco iv x1 5/31, Cefepime iv x1 5/31 Rocephin, Zithromax 5/31->  Anti-infectives (From admission, onward)    Start     Dose/Rate Route Frequency Ordered Stop   08/04/21 2200  vancomycin (VANCOREADY) IVPB 500 mg/100 mL  Status:  Discontinued  See Hyperspace for full Linked Orders Report.   500 mg 100 mL/hr over 60 Minutes Intravenous Every 24 hours 08/03/21 2028 08/03/21 2248   08/04/21 0800  cefTRIAXone (ROCEPHIN) 2 g in  sodium chloride 0.9 % 100 mL IVPB        2 g 200 mL/hr over 30 Minutes Intravenous Every 24 hours 08/03/21 2334     08/04/21 0030  azithromycin (ZITHROMAX) 500 mg in sodium chloride 0.9 % 250 mL IVPB        500 mg 250 mL/hr over 60 Minutes Intravenous Daily at bedtime 08/03/21 2334     08/03/21 2030  vancomycin (VANCOREADY) IVPB 750 mg/150 mL       See Hyperspace for full Linked Orders Report.   750 mg 150 mL/hr over 60 Minutes Intravenous  Once 08/03/21 2028 08/03/21 2249   08/03/21 2015  ceFEPIme (MAXIPIME) 1 g in sodium chloride 0.9 % 100 mL IVPB        1 g 200 mL/hr over 30 Minutes Intravenous  Once 08/03/21 2006 08/03/21 2145         Objective:   Vitals:   08/03/21 2230 08/03/21 2251 08/03/21 2328 08/04/21 0339  BP: (!) 160/87  (!) 187/100 129/78  Pulse: 98  100 (!) 106  Resp: 16  18 18   Temp:  98.3 F (36.8 C) 98.7 F (37.1 C) 98 F (36.7 C)  TempSrc:  Oral Oral   SpO2: 100%  100% 100%  Weight:      Height:        Wt Readings from Last 3 Encounters:  08/03/21 36.3 kg  07/12/21 36.7 kg  07/11/21 37.3 kg     Intake/Output Summary (Last 24 hours) at 08/04/2021 0655 Last data filed at 08/04/2021 0500 Gross per 24 hour  Intake 336.54 ml  Output 700 ml  Net -363.46 ml     Physical Exam  Awake Alert, Oriented X 3,  Heent: anicteric Neck: no jvd, no bruit Heart: rrr s1, s2, no m/g/r Lung: slight crackle at left lung base, otherwise clear, no wheezing Abd: soft, slight discomfort with palpation of the bilateral lower abdomen, no guarding, no rebound, +bs  Ext: no c/c/e Skin: no rash  Ekg: nsr at 72, nl axis, nl pr, nl qtc, poor R progression, t inversion in v1-4 (new compared to 5/31) ? Lead placement   Data Review:    CBC Recent Labs  Lab 08/03/21 1426 08/04/21 0434  WBC 7.4 7.4  HGB 11.3* 10.2*  HCT 37.0 33.8*  PLT 376 314  MCV 112.1* 115.0*  MCH 34.2* 34.7*  MCHC 30.5 30.2  RDW 14.3 14.0  LYMPHSABS 0.7  --   MONOABS 0.4  --   EOSABS 0.0  --    BASOSABS 0.0  --     Chemistries  Recent Labs  Lab 08/03/21 1426 08/03/21 1519 08/03/21 1947 08/04/21 0434  NA 134*  --   --   --   K 3.0*  --   --   --   CL 98  --   --   --   CO2 23  --   --   --   GLUCOSE 106*  --   --   --   BUN 9  --   --   --   CREATININE 0.56  --   --   --   CALCIUM 8.9  --   --   --   MG  --  1.6* 1.5* 2.1  AST 23  --   --   --  ALT 15  --   --   --   ALKPHOS 122  --   --   --   BILITOT 1.2  --   --   --    ------------------------------------------------------------------------------------------------------------------ No results for input(s): CHOL, HDL, LDLCALC, TRIG, CHOLHDL, LDLDIRECT in the last 72 hours.  Lab Results  Component Value Date   HGBA1C 4.7 (L) 06/07/2021   ------------------------------------------------------------------------------------------------------------------ No results for input(s): TSH, T4TOTAL, T3FREE, THYROIDAB in the last 72 hours.  Invalid input(s): FREET3 ------------------------------------------------------------------------------------------------------------------ No results for input(s): VITAMINB12, FOLATE, FERRITIN, TIBC, IRON, RETICCTPCT in the last 72 hours.  Coagulation profile Recent Labs  Lab 08/03/21 1426  INR 0.9    No results for input(s): DDIMER in the last 72 hours.  Cardiac Enzymes No results for input(s): CKMB, TROPONINI, MYOGLOBIN in the last 168 hours.  Invalid input(s): CK ------------------------------------------------------------------------------------------------------------------ No results found for: BNP  Inpatient Medications  Scheduled Meds:  amLODipine  10 mg Oral Daily   dextromethorphan-guaiFENesin  1 tablet Oral BID   DULoxetine  30 mg Oral QHS   lipase/protease/amylase  24,000-72,000 Units Oral TID AC   metoprolol tartrate  12.5 mg Oral BID   mirtazapine  7.5 mg Oral QHS   mometasone-formoterol  2 puff Inhalation BID   oxybutynin  5 mg Oral QHS    oxyCODONE  15 mg Oral Q12H   pantoprazole (PROTONIX) IV  40 mg Intravenous Q24H   Ensure Max Protein  1 Container Oral TID   traZODone  150 mg Oral QHS   umeclidinium bromide  1 puff Inhalation Daily   Continuous Infusions:  0.9 % NaCl with KCl 40 mEq / L 100 mL/hr at 08/04/21 0150   azithromycin 500 mg (08/04/21 0047)   cefTRIAXone (ROCEPHIN)  IV     heparin 450 Units/hr (08/03/21 2102)   PRN Meds:.acetaminophen **OR** acetaminophen, diphenhydrAMINE, ipratropium-albuterol, ondansetron **OR** ondansetron (ZOFRAN) IV, oxyCODONE-acetaminophen **AND** oxyCODONE, polyethylene glycol, tiZANidine  Micro Results Recent Results (from the past 240 hour(s))  Resp Panel by RT-PCR (Flu A&B, Covid) Anterior Nasal Swab     Status: None   Collection Time: 08/03/21  3:00 PM   Specimen: Anterior Nasal Swab  Result Value Ref Range Status   SARS Coronavirus 2 by RT PCR NEGATIVE NEGATIVE Final    Comment: (NOTE) SARS-CoV-2 target nucleic acids are NOT DETECTED.  The SARS-CoV-2 RNA is generally detectable in upper respiratory specimens during the acute phase of infection. The lowest concentration of SARS-CoV-2 viral copies this assay can detect is 138 copies/mL. A negative result does not preclude SARS-Cov-2 infection and should not be used as the sole basis for treatment or other patient management decisions. A negative result may occur with  improper specimen collection/handling, submission of specimen other than nasopharyngeal swab, presence of viral mutation(s) within the areas targeted by this assay, and inadequate number of viral copies(<138 copies/mL). A negative result must be combined with clinical observations, patient history, and epidemiological information. The expected result is Negative.  Fact Sheet for Patients:  EntrepreneurPulse.com.au  Fact Sheet for Healthcare Providers:  IncredibleEmployment.be  This test is no t yet approved or cleared by  the [place] FDA and  has been authorized for detection and/or diagnosis of SARS-CoV-2 by FDA under an Emergency Use Authorization (EUA). This EUA will remain  in effect (meaning this test can be used) for the duration of the COVID-19 declaration under Section 564(b)(1) of the Act, 21 U.S.C.section 360bbb-3(b)(1), unless the authorization is terminated  or revoked sooner.  Influenza A by PCR NEGATIVE NEGATIVE Final   Influenza B by PCR NEGATIVE NEGATIVE Final    Comment: (NOTE) The Xpert Xpress SARS-CoV-2/FLU/RSV plus assay is intended as an aid in the diagnosis of influenza from Nasopharyngeal swab specimens and should not be used as a sole basis for treatment. Nasal washings and aspirates are unacceptable for Xpert Xpress SARS-CoV-2/FLU/RSV testing.  Fact Sheet for Patients: EntrepreneurPulse.com.au  Fact Sheet for Healthcare Providers: IncredibleEmployment.be  This test is not yet approved or cleared by the [place] FDA and has been authorized for detection and/or diagnosis of SARS-CoV-2 by FDA under an Emergency Use Authorization (EUA). This EUA will remain in effect (meaning this test can be used) for the duration of the COVID-19 declaration under Section 564(b)(1) of the Act, 21 U.S.C. section 360bbb-3(b)(1), unless the authorization is terminated or revoked.  Performed at [facility], [address], [place], [place] [address]     Radiology Reports DG Chest 2 View  Result Date: 07/12/2021 CLINICAL DATA:  Lung cancer EXAM: CHEST - 2 VIEW COMPARISON:  Radiograph March 11, 2021 and chest CT June 27, 2021. FINDINGS: The heart size and mediastinal contours are within normal limits. Postsurgical change right upper lobectomy with right hilar chain staples and elevation right hilum, similar to prior chest CT. Right basilar pleural thickening appears similar to prior chest CT as well. No new focal airspace consolidation. Chronic  parenchymal lung changes. Spinal stimulator leads. IMPRESSION: Postsurgical change right upper lobectomy. Right basilar pleural thickening appears similar to prior chest CT. No new focal airspace consolidation. Electronically Signed   By: Dahlia Bailiff M.D.   On: 07/12/2021 14:02   DG Chest Portable 1 View  Result Date: 08/03/2021 CLINICAL DATA:  Chest pain.  COPD EXAM: PORTABLE CHEST 1 VIEW COMPARISON:  07/12/2021 FINDINGS: COPD with hyperinflation and pulmonary scarring. Postsurgical changes and scarring in the right upper lobe. Blunting right costophrenic angle unchanged compatible with scarring from surgery. Spinal cord stimulator midthoracic spine unchanged No acute infiltrate.  No heart failure or edema. IMPRESSION: COPD and scarring with postoperative changes on the right. No acute cardiopulmonary process. Electronically Signed   By: Franchot Gallo M.D.   On: 08/03/2021 15:21   [place] LT LOWER EXTREM LTD SOFT TISSUE NON VASCULAR  Result Date: 07/22/2021 CLINICAL DATA:  Lateral foot and ankle pain and swelling for 1 month EXAM: ULTRASOUND LEFT LOWER EXTREMITY LIMITED TECHNIQUE: Ultrasound examination of the lower extremity soft tissues was performed in the area of clinical concern. COMPARISON:  Left foot x-ray 09/15/2020 FINDINGS: Targeted ultrasound was performed of the soft tissues of the left lower extremity at site of patient's clinical concern. Images obtained of the lateral aspect of the left foot and left ankle. There is edema and ill-defined fluid within the subcutaneous soft tissues, most pronounced overlying the left ankle laterally. No organized fluid collection. Underlying peroneal tendons at this location appear intact. No significant tenosynovial fluid collection. IMPRESSION: Nonspecific soft tissue swelling and edema over the lateral left foot and ankle. No associated soft tissue or tenosynovial fluid collection. Electronically Signed   By: Davina Poke D.O.   On: 07/22/2021 19:58    CT Angio Chest/Abd/Pel for Dissection W and/or Wo Contrast  Result Date: 08/03/2021 CLINICAL DATA:  Acute aortic syndrome (AAS) suspected EXAM: CT ANGIOGRAPHY CHEST, ABDOMEN AND PELVIS TECHNIQUE: Non-contrast CT of the chest was initially obtained. Multidetector CT imaging through the chest, abdomen and pelvis was performed using the standard protocol during bolus administration of intravenous contrast. Multiplanar  reconstructed images and MIPs were obtained and reviewed to evaluate the vascular anatomy. RADIATION DOSE REDUCTION: This exam was performed according to the departmental dose-optimization program which includes automated exposure control, adjustment of the mA and/or kV according to patient size and/or use of iterative reconstruction technique. CONTRAST:  99mL OMNIPAQUE IOHEXOL 350 MG/ML SOLN COMPARISON:  CT abdomen pelvis 03/11/2021, ultrasound abdomen 06/29/2021, CT chest 06/27/2021 FINDINGS: CTA CHEST FINDINGS Cardiovascular: Normal heart size. No significant pericardial effusion. The thoracic aorta is normal in caliber. At least moderate atherosclerotic plaque of the thoracic aorta. At least 2 vessel coronary artery calcifications. The main pulmonary artery is normal in caliber. Enlarged right main pulmonary artery. No central or segmental pulmonary embolus. Mediastinum/Nodes: No enlarged mediastinal, hilar, or axillary lymph nodes. Subcentimeter right thyroid gland hypodensity. Not clinically significant; no follow-up imaging recommended (ref: J Am Coll Radiol. 2015 Feb;12(2): 143-50). Trachea and esophagus demonstrate no significant findings. Lungs/Pleura: Severe centrilobular emphysematous changes. Surgical changes related to right upper lobectomy. Chronic expansion of the right lower lobe. Interval development of a 1.2 x 1 2.6 cm nodular like consolidation within the right lower lobe (7:40). Interval increase in partial collapse of the right middle lobe with patchy airspace opacity. No  pulmonary nodule. No pulmonary mass. No pleural effusion. No pneumothorax. Musculoskeletal: No chest wall abnormality. No suspicious lytic or blastic osseous lesions. No acute displaced fracture. Multilevel degenerative changes of the spine. Neural stimulator leads terminate in the posterior central canal at the T6 through T7 levels. Review of the MIP images confirms the above findings. CTA ABDOMEN AND PELVIS FINDINGS VASCULAR Aorta: Severe atherosclerotic plaque. Normal caliber aorta without aneurysm, dissection, vasculitis or significant stenosis. Celiac: Patent without evidence of aneurysm, dissection, vasculitis or significant stenosis. SMA: Patent without evidence of aneurysm, dissection, vasculitis or significant stenosis. Renals: Both renal arteries are patent without evidence of aneurysm, dissection, vasculitis, fibromuscular dysplasia or significant stenosis. IMA: Patent without evidence of aneurysm, dissection, vasculitis or significant stenosis. Inflow: Atherosclerotic plaque. Patent without evidence of aneurysm, dissection, vasculitis or significant stenosis. Veins: No obvious venous abnormality within the limitations of this arterial phase study. Review of the MIP images confirms the above findings. NON-VASCULAR Hepatobiliary: No focal liver abnormality. No CT evidence of gallstones. Persistent gallbladder wall thickening. No definite pericholecystic fluid. Enlarged common bile duct measuring up to 1.7 cm. Pancreas: No focal lesion. Normal pancreatic contour. No surrounding inflammatory changes. No main pancreatic ductal dilatation. Spleen: Normal in size without focal abnormality. Adrenals/Urinary Tract: No adrenal nodule bilaterally. Bilateral kidneys enhance symmetrically. Subcentimeter hypodensities bilaterally are too small to characterize. Pericentimeter fluid density lesions within bilateral kidneys statistically likely represent simple renal cysts. Simple renal cysts, in the absence of  clinically indicated signs/symptoms, require no independent follow-up. There is a 1.1 cm hypodense lesion within the left kidney with a density of 29 Hounsfield units (5:108). No hydronephrosis. No hydroureter. The urinary bladder is unremarkable. Stomach/Bowel: Stomach is within normal limits. No evidence of bowel wall thickening or dilatation. The appendix is not definitely identified with no inflammatory changes in the right lower quadrant to suggest acute appendicitis. Lymphatic: Slightly limited evaluation of the to peritoneum due to streak artifact originating from the lumbar surgical hardware. No lymphadenopathy. Reproductive: Status post hysterectomy. No adnexal masses. Other: No intraperitoneal free fluid. No intraperitoneal free gas. No organized fluid collection. Musculoskeletal: No abdominal wall hernia or abnormality. Diffusely decreased bone density. No suspicious lytic or blastic osseous lesions. No acute displaced fracture. Status post L3-L5 posterolateral and interbody fusion. Multilevel degenerative  changes of the spine. Review of the MIP images confirms the above findings. IMPRESSION: 1. No aorta aneurysm or dissection. Aortic aneurysm NOS (ICD10-I71.9). 2. Interval development of a 1.2 x 1 2.6 cm nodular-like consolidation within the right lower lobe. Finding likely represents infection. Underlying malignancy not fully excluded but less likely given appearance of the right upper lobe 1 month ago. Additional imaging evaluation or consultation with Pulmonology or Thoracic Surgery recommended. 3. Interval increase in partial collapse and patchy airspace opacity of the right middle lobe. Finding may represent a combination of atelectasis versus infection/inflammation. 4. Status post right upper lobectomy. 5. Interval development of a 1.2 x 1 2.6 cm nodular like consolidation within the right lower lobe. 6. Enlarged common bile duct with associated persistent nonspecific gallbladder wall thickening.  Gallbladder wall thickening can be seen in the setting of chronic liver disease versus acute cholecystitis. Correlate with liver function tests. 7. Indeterminate 1.1 cm left renal lesion. Finding may represent a minimally complex renal cyst versus solid renal lesion. Consider nonemergent further evaluation with MRI or CT renal protocol. 8. Aortic Atherosclerosis (ICD10-I70.0). Electronically Signed   By: Iven Finn M.D.   On: 08/03/2021 19:24    Time Spent in minutes  30  Dr. Jani Gravel saw and evaluated pt on 08/04/21 -Dr. Jani Gravel formulated plan of care for this patient-  Jani Gravel M.D on 08/04/2021 at 6:55 AM  Between 7am to 7pm - Pager - [phone]  After 7pm go to [email or web address] - password [facility]  [facility] -  Office  [phone]

## 2021-08-05 DIAGNOSIS — R079 Chest pain, unspecified: Secondary | ICD-10-CM | POA: Diagnosis not present

## 2021-08-05 DIAGNOSIS — J9611 Chronic respiratory failure with hypoxia: Secondary | ICD-10-CM | POA: Diagnosis not present

## 2021-08-05 DIAGNOSIS — J189 Pneumonia, unspecified organism: Secondary | ICD-10-CM | POA: Diagnosis not present

## 2021-08-05 LAB — COMPREHENSIVE METABOLIC PANEL
ALT: 10 U/L (ref 0–44)
AST: 13 U/L — ABNORMAL LOW (ref 15–41)
Albumin: 2.4 g/dL — ABNORMAL LOW (ref 3.5–5.0)
Alkaline Phosphatase: 76 U/L (ref 38–126)
Anion gap: 6 (ref 5–15)
BUN: 8 mg/dL (ref 8–23)
CO2: 28 mmol/L (ref 22–32)
Calcium: 8.7 mg/dL — ABNORMAL LOW (ref 8.9–10.3)
Chloride: 104 mmol/L (ref 98–111)
Creatinine, Ser: 0.31 mg/dL — ABNORMAL LOW (ref 0.44–1.00)
GFR, Estimated: >60 mL/min (ref >60)
Glucose, Bld: 104 mg/dL — ABNORMAL HIGH (ref 70–99)
Potassium: 3.9 mmol/L (ref 3.5–5.1)
Sodium: 138 mmol/L (ref 135–145)
Total Bilirubin: 0.5 mg/dL (ref 0.3–1.2)
Total Protein: 5.3 g/dL — ABNORMAL LOW (ref 6.5–8.1)

## 2021-08-05 LAB — CBC
HCT: 32.7 % — ABNORMAL LOW (ref 36.0–46.0)
Hemoglobin: 10 g/dL — ABNORMAL LOW (ref 12.0–15.0)
MCH: 34.8 pg — ABNORMAL HIGH (ref 26.0–34.0)
MCHC: 30.6 g/dL (ref 30.0–36.0)
MCV: 113.9 fL — ABNORMAL HIGH (ref 80.0–100.0)
Platelets: 248 10*3/uL (ref 150–400)
RBC: 2.87 MIL/uL — ABNORMAL LOW (ref 3.87–5.11)
RDW: 13.9 % (ref 11.5–15.5)
WBC: 4.9 10*3/uL (ref 4.0–10.5)
nRBC: 0 % (ref 0.0–0.2)

## 2021-08-05 LAB — VITAMIN B12: Vitamin B-12: 465 pg/mL (ref 180–914)

## 2021-08-05 LAB — IRON AND TIBC
Iron: 50 ug/dL (ref 28–170)
Saturation Ratios: 27 % (ref 10.4–31.8)
TIBC: 188 ug/dL — ABNORMAL LOW (ref 250–450)
UIBC: 138 ug/dL

## 2021-08-05 LAB — FERRITIN: Ferritin: 280 ng/mL (ref 11–307)

## 2021-08-05 LAB — FOLATE: Folate: 11.2 ng/mL (ref >5.9)

## 2021-08-05 MED ORDER — BISOPROLOL FUMARATE 5 MG PO TABS
5.0000 mg | ORAL_TABLET | Freq: Every day | ORAL | Status: DC
  Administered 2021-08-06 – 2021-08-07 (×2): 5 mg via ORAL
  Filled 2021-08-05 (×2): qty 1

## 2021-08-05 NOTE — Care Management Important Message (Signed)
Important Message  Patient Details  Name: Jasmine [name] MRN: [id number] Date of Birth: 1956-01-13   Medicare Important Message Given:  Yes     [name] 08/05/2021, 1:13 PM

## 2021-08-05 NOTE — Consult Note (Signed)
[facility] Inpatient Consult  08/05/2021  Jasmine [name] 06/08/1955 [id number]  [place] [facility] CM)   Patient chart has been reviewed with noted extreme high risk score for unplanned readmissions.  Patient assessed for community [place] [facility] follow up needs. Per review, patient's primary provider office offers embedded chronic care coordination services.  Plan: Will continue to follow for progression and disposition plans.   Of note, [facility] Care Management services does not replace or interfere with any services that are arranged by inpatient case management or social work.    Netta Cedars, MSN, RN [facility] Liaison Toll free office [phone]

## 2021-08-05 NOTE — TOC Initial Note (Signed)
Transition of Care [facility]) - Initial/Assessment Note    Patient Details  Name: Jasmine [name] MRN: [id number] Date of Birth: 1955-07-21  [facility] CM/SW Contact:    Shade Flood, LCSW Phone Number: 08/05/2021, 11:28 AM  Clinical Narrative:                  Pt admitted from home. Pt changed from observation status to inpatient admission. She has high readmission score. Met with pt at bedside today to assess. Per pt, she lives with her husband at home and he assists with all household tasks and he drives pt to appointments and picks up her medications. Pt is independent in other ADLs. She has a cane, walker, wheelchair, elevated toilet seat, 3in1, shower chair, and O2 for DME. Offered HH at dc and pt declined.  Pt does not anticipate any TOC needs for dc. Will follow and assist if needs arise.  Expected Discharge Plan: Home/Self Care Barriers to Discharge: Continued Medical Work up   Patient Goals and CMS Choice Patient states their goals for this hospitalization and ongoing recovery are:: go home   Choice offered to / list presented to : Patient  Expected Discharge Plan and Services Expected Discharge Plan: Home/Self Care In-house Referral: Clinical Social Work   Post Acute Care Choice: Resumption of Svcs/PTA Provider Living arrangements for the past 2 months: Single Family Home                                      Prior Living Arrangements/Services Living arrangements for the past 2 months: Single Family Home Lives with:: Spouse Patient language and need for interpreter reviewed:: Yes Do you feel safe going back to the place where you live?: Yes      Need for Family Participation in Patient Care: Yes (Comment) Care giver support system in place?: Yes (comment) Current home services: DME Criminal Activity/Legal Involvement Pertinent to Current Situation/Hospitalization: No - Comment as needed  Activities of Daily Living      Permission  Sought/Granted                  Emotional Assessment Appearance:: Appears stated age Attitude/Demeanor/Rapport: Engaged Affect (typically observed): Pleasant Orientation: : Oriented to Self, Oriented to Place, Oriented to  Time, Oriented to Situation Alcohol / Substance Use: Tobacco Use Psych Involvement: No (comment)  Admission diagnosis:  Elevated troponin [R77.8] PNA (pneumonia) [J18.9] Nausea and vomiting, unspecified vomiting type [R11.2] Pneumonia [J18.9] Patient Active Problem List   Diagnosis Date Noted   Pneumonia 08/04/2021   PNA (pneumonia) 08/03/2021   Abdominal pain with vomiting 06/29/2021   Chronic pain syndrome 03/13/2021   Pressure injury of skin 03/12/2021   Acute on chronic pancreatitis ([place]) 03/12/2021   Intractable abdominal pain 03/11/2021   Intractable pain 02/12/2021   Squamous cell lung cancer, right ([place]) 01/13/2021   Atypical chest pain 11/26/2020   Elevated MCV 11/26/2020   Intractable nausea and vomiting 11/26/2020   History of Clostridioides difficile infection 11/26/2020   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[place]) 10/11/2020   Gastroenteritis 10/11/2020   Acute gastroenteritis 10/10/2020   Protein-calorie malnutrition, severe 08/26/2020   Chest pain 08/25/2020   Pleuritic chest pain 08/25/2020   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   GERD (gastroesophageal reflux disease) 04/22/2020   Abnormal CT of the abdomen 04/22/2020   Preventative health care 04/22/2020   Solitary pulmonary nodule 03/22/2020   Pulmonary infiltrate on  chest x-ray 02/24/2020   Nocturnal cough 02/19/2020   Pancreatitis, acute 01/06/2019   Acute pancreatitis 01/05/2019   Cigarette smoker 08/25/2017   Chronic respiratory failure with hypoxia ([place]) 08/25/2017   Centrilobular emphysema ([place]) 07/09/2017   Aortic atherosclerosis ([place]) 07/09/2017   Hepatic steatosis 07/09/2017   Malnutrition of moderate degree 07/02/2017   Acute respiratory failure with hypoxia ([place]) 06/30/2017    Hypokalemia 06/30/2017   COPD with acute exacerbation ([place]) 06/30/2017   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/2016   Tobacco abuse 08/04/2016   Iron deficiency anemia 07/11/2016   Easy bruising 07/10/2016   Lumbar stenosis 11/11/2015   Leg pain 09/24/2015   Vitamin D deficiency 07/29/2015   Chronic back pain 02/22/2015   HTN (hypertension) 02/22/2015   Chronic narcotic use 02/22/2015   COPD GOLD II  02/22/2015   Headache 02/22/2015   PCP:  Janora Norlander, DO Pharmacy:   [facility] [place], [place] - [address] [place] [address] Phone: [phone] Fax: [phone]  [facility] - [place] Buff, [place] [address] [place] [address] Phone: [phone] Fax: [phone]     Social Determinants of Health (SDOH) Interventions    Readmission Risk Interventions    08/05/2021   11:27 AM 07/01/2021    1:45 PM 02/14/2021   10:44 AM  Readmission Risk Prevention Plan  Transportation Screening Complete Complete Complete  Medication Review ([place]) Complete  Complete  PCP or Specialist appointment within 3-5 days of discharge  Not Complete   HRI or Home Care Consult Patient refused Complete Patient refused  SW Recovery Care/Counseling Consult Complete Complete Complete  Palliative Care Screening Not Applicable Not Applicable Not [place] Not Applicable Not Applicable Not Applicable

## 2021-08-05 NOTE — Progress Notes (Addendum)
PROGRESS NOTE                                                                                                                                                                                                             Patient Demographics:    Jasmine [name], is a 66 y.o. female, DOB - 05-04-1955, OEV:[id number]  Admit date - 08/03/2021   Admitting Physician Weslynn Ke Denton Brick, MD  Outpatient Primary MD for the patient is Janora Norlander, DO  LOS - 1  Outpatient Specialists: Dr. Sydell Axon (GI) Dr. Tera Helper (oncologist)  Chief Complaint  Patient presents with   Abdominal Pain       Brief Narrative  Jasmine [name] is a 66 y.o. female with medical history significant for  COPD, chronic hypoxic respiratory failure on 4 L, lung cancer status post right upper lobectomy, protein calorie malnutrition, and chronic pain.  Patient presented to the ED with complaints of multiple episodes of vomiting and loose stools of 4 days duration.  She reports average of 8-10 episodes of vomiting daily, and 5-6 episodes of diarrhea also.  She reports poor oral intake in the past 4 days.  She reports onset of chest pain with her GI symptoms.  Chest pain is bilateral lower ribs and to the side, constant, and hurts when she breathes and coughs.  She reports mid abdominal pain.  She denies black stools.  She denies NSAID use and uses Tylenol and oxycodone/OxyContin.  She denies known cardiac history.  She reports chronic productive cough that is worse today.  She has chronic difficulty breathing, that is unchanged.  Patient has a history of chronic nausea.  She reports today reports vomiting and diarrhea have significantly reduced in frequency.  Hospitalization 4/25 to 4/30 also for abdominal pain and vomiting.  She has chronic CBD dilatation, MRI could not be obtained due to implanted stimulator.  GI recommended pancreatic enzymes, and that her CBD had been  dilated for years.   ED Course: Temperature 97.9.  Heart rate 101-130.  Respiratory rate 13 -18.  Blood pressure 829 -937 systolic.  O2 sats greater than 98% on room air.  Troponin 46 > 53.  Lactic acid 0.8.  Potassium 3.  Magnesium 1.6.  EKG without changes.  ED provider talked to cardiologist Dr. Virgina Jock, recommended starting heparin drip, admit to [facility].  I evaluated patient and felt admission to [facility] was not needed at this time, sent a secure chat message to Dr. Virgina Jock, we agreed that patient can stay here for now.   Subjective:    Jasmine [name] today has continued chest discomfort.  " Throbbing, and tightness" worse with inspiration.  Pt notes cough (nonproductive), and dyspnea but not worse than baseline.  Pt denies fever, chills, n/v, diarrhea.  Slight abdominal discomfort, but this has improved as well.  Her diarrhea (started 5/28,having 6x per day) resolved 2 days ago. Her n/v (started 5/28, 6-8 x per day, not improved with phenergan supp, protonix), resolved yesterday.  Pt overall feeling better.  Pt denies fever, chills, lower ext edema. Pt has not tried breakfast yet.  Pt notes that the pancrealipase has helped some with her GI symptoms. Pt notes that she is able to tolerate rocephin / zithromax (only allerghic to the older version) per pt.   - -cough, dyspnea and hypoxia persist -Complains of chronic pain issues as well including back pain   Assessment  & Plan :    Principal Problem:   PNA (pneumonia) Active Problems:   HTN (hypertension)   Chronic narcotic use   COPD GOLD II    Chronic respiratory failure with hypoxia (HCC)   Chest pain   Abdominal pain with vomiting   Pneumonia   RM/RLL Pneumonia/ HCAP urine legionella antigen pending Received Vanco/ Cefepime x1 in ED Cont Rocephin/ Zithromax  -MRSA screen is  negative okay to discontinue vancomycin -Cough and dyspnea improving  N/v, Abdominal pain/ Diarrhea ddx gastritis, gerd,  pancreatic  insufficiency Chronic CBD dilation, unable to get MRI due to spinal stimulator Cont Creon Cont protonix 40mg  iv qday Cont Zofran prn -Diarrhea improved  CP, atypical/ Elevated Trop  + coronary artery calcifications 2V on CTA chest 5/31 46>53> 50>31 -EKG was sinus without acute ST changes Initially treated with IV heparin  -Chest pain and troponin elevation pattern not consistent with ACS cardiology consult appreciated -On echo EF is 60 to 65%, without regional wall motion normalities -Continue Lipitor, change metoprolol to bisoprolol due to pulmonary/bronchospasms concerns   Chronic Respiratory Failure with hypoxia/ Copd on home o2 Cont Symbicort -> Dulera 2puff bid Cont Ventolin HFA-> Duoneb 1 q4h prn Cont Mucinex 1 po bid -PTA patient was on 4 L of oxygen via nasal cannula continuously -Patient admits to not really using oxygen at home most of the time because she had to smoke frequently   Chronic narcotic use Chronic back pain w sciatica Chronic right hip pain Cont Oxycontin 15mg  po bid Cont oxycodone 10mg  po q4h prn  Cont Cymbalta 30mg  po qhs  Hypertension Stop metoprolol use bisoprolol due to bronchospasms concern Cont Norvasc 10mg  po qday  Hypokalemia/Hypomagnesemia Replace and recheck  Macrocytic anemia, hx of iron deficiency -Serum B12 is not low -Serum folate is not low -Serum iron is not low  Insomnia Cont Remeron 7.5mg  po qhs Cont Trazodone 150mg  po qhs  1.1cm L renal lesion indeterminate Outpatient follow up recommended w MRI or CT   Code Status : Full CODE  Family Communication  :  attempted to update husband at [phone] but no response, patient states that she will update her husband herself  Disposition Plan  : home  Barriers For Discharge :  none  Consults  :  cardiology  Procedures  :  CTA chest 5/31  DVT Prophylaxis  :  Heparin   Lab Results  Component Value Date   PLT 248  08/05/2021    Antibiotics  :   Vanco iv x1 5/31,  Cefepime iv x1 5/31 Rocephin, Zithromax 5/31->  Anti-infectives (From admission, onward)    Start     Dose/Rate Route Frequency Ordered Stop   08/04/21 2200  vancomycin (VANCOREADY) IVPB 500 mg/100 mL  Status:  Discontinued       See Hyperspace for full Linked Orders Report.   500 mg 100 mL/hr over 60 Minutes Intravenous Every 24 hours 08/03/21 2028 08/03/21 2248   08/04/21 0800  cefTRIAXone (ROCEPHIN) 2 g in sodium chloride 0.9 % 100 mL IVPB        2 g 200 mL/hr over 30 Minutes Intravenous Every 24 hours 08/03/21 2334     08/04/21 0030  azithromycin (ZITHROMAX) 500 mg in sodium chloride 0.9 % 250 mL IVPB        500 mg 250 mL/hr over 60 Minutes Intravenous Daily at bedtime 08/03/21 2334     08/03/21 2030  vancomycin (VANCOREADY) IVPB 750 mg/150 mL       See Hyperspace for full Linked Orders Report.   750 mg 150 mL/hr over 60 Minutes Intravenous  Once 08/03/21 2028 08/03/21 2249   08/03/21 2015  ceFEPIme (MAXIPIME) 1 g in sodium chloride 0.9 % 100 mL IVPB        1 g 200 mL/hr over 30 Minutes Intravenous  Once 08/03/21 2006 08/03/21 2145         Objective:   Vitals:   08/04/21 2002 08/04/21 2056 08/05/21 0417 08/05/21 0749  BP:  117/72 122/71   Pulse: 96 98 83   Resp: (!) 24 19 14    Temp:  99 F (37.2 C) 98.4 F (36.9 C)   TempSrc:      SpO2: 100% 100% 100% 100%  Weight:      Height:        Wt Readings from Last 3 Encounters:  08/03/21 36.3 kg  07/12/21 36.7 kg  07/11/21 37.3 kg     Intake/Output Summary (Last 24 hours) at 08/05/2021 1757 Last data filed at 08/05/2021 0300 Gross per 24 hour  Intake 350 ml  Output --  Net 350 ml   Physical Exam  Gen:- Awake Alert, in no acute distress  HEENT:- [place].AT, No sclera icterus Nose- [place] 4L/min Neck-Supple Neck,No JVD,.  Lungs-improving air movement, no rales  CV- S1, S2 normal, RRR Abd-  +ve B.Sounds, Abd Soft, No tenderness,    Extremity/Skin:- No  edema,   good pedal pulses  Psych-affect is appropriate, oriented  x3 Neuro-generalized weakness, no new focal deficits, no tremors    Data Review:    CBC Recent Labs  Lab 08/03/21 1426 08/04/21 0434 08/05/21 0508  WBC 7.4 7.4 4.9  HGB 11.3* 10.2* 10.0*  HCT 37.0 33.8* 32.7*  PLT 376 314 248  MCV 112.1* 115.0* 113.9*  MCH 34.2* 34.7* 34.8*  MCHC 30.5 30.2 30.6  RDW 14.3 14.0 13.9  LYMPHSABS 0.7  --   --   MONOABS 0.4  --   --   EOSABS 0.0  --   --   BASOSABS 0.0  --   --     Chemistries  Recent Labs  Lab 08/03/21 1426 08/03/21 1519 08/03/21 1947 08/04/21 0434 08/05/21 0508  NA 134*  --   --   --  138  K 3.0*  --   --   --  3.9  CL 98  --   --   --  104  CO2 23  --   --   --  28  GLUCOSE 106*  --   --   --  104*  BUN 9  --   --   --  8  CREATININE 0.56  --   --   --  0.31*  CALCIUM 8.9  --   --   --  8.7*  MG  --  1.6* 1.5* 2.1  --   AST 23  --   --   --  13*  ALT 15  --   --   --  10  ALKPHOS 122  --   --   --  76  BILITOT 1.2  --   --   --  0.5   ------------------------------------------------------------------------------------------------------------------ No results for input(s): CHOL, HDL, LDLCALC, TRIG, CHOLHDL, LDLDIRECT in the last 72 hours.  Lab Results  Component Value Date   HGBA1C 4.7 (L) 06/07/2021   ------------------------------------------------------------------------------------------------------------------ No results for input(s): TSH, T4TOTAL, T3FREE, THYROIDAB in the last 72 hours.  Invalid input(s): FREET3 ------------------------------------------------------------------------------------------------------------------ Recent Labs    08/05/21 0508  VITAMINB12 465  FOLATE 11.2  FERRITIN 280  TIBC 188*  IRON 50    Coagulation profile Recent Labs  Lab 08/03/21 1426  INR 0.9    No results for input(s): DDIMER in the last 72 hours.  Cardiac Enzymes No results for input(s): CKMB, TROPONINI, MYOGLOBIN in the last 168 hours.  Invalid input(s):  CK ------------------------------------------------------------------------------------------------------------------ No results found for: BNP  Inpatient Medications  Scheduled Meds:  amLODipine  10 mg Oral Daily   atorvastatin  40 mg Oral Daily   [START ON 08/06/2021] bisoprolol  5 mg Oral Daily   dextromethorphan-guaiFENesin  1 tablet Oral BID   DULoxetine  30 mg Oral QHS   heparin  5,000 Units Subcutaneous Q8H   lipase/protease/amylase  24,000-72,000 Units Oral TID AC   mirtazapine  7.5 mg Oral QHS   mometasone-formoterol  2 puff Inhalation BID   oxybutynin  5 mg Oral QHS   oxyCODONE  15 mg Oral Q12H   pantoprazole (PROTONIX) IV  40 mg Intravenous Q24H   Ensure Max Protein  1 Container Oral TID   traZODone  150 mg Oral QHS   umeclidinium bromide  1 puff Inhalation Daily   Continuous Infusions:  azithromycin 500 mg (08/04/21 2158)   cefTRIAXone (ROCEPHIN)  IV 2 g (08/05/21 0818)   PRN Meds:.acetaminophen **OR** acetaminophen, diphenhydrAMINE, hydrALAZINE, HYDROcodone bit-homatropine, ipratropium-albuterol, ondansetron **OR** ondansetron (ZOFRAN) IV, oxyCODONE-acetaminophen **AND** oxyCODONE, polyethylene glycol, prochlorperazine, tiZANidine  Micro Results Recent Results (from the past 240 hour(s))  Resp Panel by RT-PCR (Flu A&B, Covid) Anterior Nasal Swab     Status: None   Collection Time: 08/03/21  3:00 PM   Specimen: Anterior Nasal Swab  Result Value Ref Range Status   SARS Coronavirus 2 by RT PCR NEGATIVE NEGATIVE Final    Comment: (NOTE) SARS-CoV-2 target nucleic acids are NOT DETECTED.  The SARS-CoV-2 RNA is generally detectable in upper respiratory specimens during the acute phase of infection. The lowest concentration of SARS-CoV-2 viral copies this assay can detect is 138 copies/mL. A negative result does not preclude SARS-Cov-2 infection and should not be used as the sole basis for treatment or other patient management decisions. A negative result may occur with   improper specimen collection/handling, submission of specimen other than nasopharyngeal swab, presence of viral mutation(s) within the areas targeted by this assay, and inadequate number of viral copies(<138 copies/mL). A negative result must be combined with clinical observations, patient history, and epidemiological information. The expected result is Negative.  Fact Sheet for Patients:  EntrepreneurPulse.com.au  Fact Sheet for Healthcare Providers:  IncredibleEmployment.be  This test is no t yet approved or cleared by the [place] FDA and  has been authorized for detection and/or diagnosis of SARS-CoV-2 by FDA under an Emergency Use Authorization (EUA). This EUA will remain  in effect (meaning this test can be used) for the duration of the COVID-19 declaration under Section 564(b)(1) of the Act, 21 U.S.C.section 360bbb-3(b)(1), unless the authorization is terminated  or revoked sooner.       Influenza A by PCR NEGATIVE NEGATIVE Final   Influenza B by PCR NEGATIVE NEGATIVE Final    Comment: (NOTE) The Xpert Xpress SARS-CoV-2/FLU/RSV plus assay is intended as an aid in the diagnosis of influenza from Nasopharyngeal swab specimens and should not be used as a sole basis for treatment. Nasal washings and aspirates are unacceptable for Xpert Xpress SARS-CoV-2/FLU/RSV testing.  Fact Sheet for Patients: EntrepreneurPulse.com.au  Fact Sheet for Healthcare Providers: IncredibleEmployment.be  This test is not yet approved or cleared by the [place] FDA and has been authorized for detection and/or diagnosis of SARS-CoV-2 by FDA under an Emergency Use Authorization (EUA). This EUA will remain in effect (meaning this test can be used) for the duration of the COVID-19 declaration under Section 564(b)(1) of the Act, 21 U.S.C. section 360bbb-3(b)(1), unless the authorization is terminated  or revoked.  Performed at [facility], [address] [place], [place] [address]   MRSA Next Gen by PCR, Nasal     Status: None   Collection Time: 08/04/21 10:28 AM   Specimen: Nasal Mucosa; Nasal Swab  Result Value Ref Range Status   MRSA by PCR Next Gen NOT DETECTED NOT DETECTED Final    Comment: (NOTE) The GeneXpert MRSA Assay (FDA approved for NASAL specimens only), is one component of a comprehensive MRSA colonization surveillance program. It is not intended to diagnose MRSA infection nor to guide or monitor treatment for MRSA infections. Test performance is not FDA approved in patients less than 43 years old. Performed at [facility], [address], [place], [place] [address]     Radiology Reports DG Chest 2 View  Result Date: 07/12/2021 CLINICAL DATA:  Lung cancer EXAM: CHEST - 2 VIEW COMPARISON:  Radiograph March 11, 2021 and chest CT June 27, 2021. FINDINGS: The heart size and mediastinal contours are within normal limits. Postsurgical change right upper lobectomy with right hilar chain staples and elevation right hilum, similar to prior chest CT. Right basilar pleural thickening appears similar to prior chest CT as well. No new focal airspace consolidation. Chronic parenchymal lung changes. Spinal stimulator leads. IMPRESSION: Postsurgical change right upper lobectomy. Right basilar pleural thickening appears similar to prior chest CT. No new focal airspace consolidation. Electronically Signed   By: Dahlia Bailiff M.D.   On: 07/12/2021 14:02   DG Chest Portable 1 View  Result Date: 08/03/2021 CLINICAL DATA:  Chest pain.  COPD EXAM: PORTABLE CHEST 1 VIEW COMPARISON:  07/12/2021 FINDINGS: COPD with hyperinflation and pulmonary scarring. Postsurgical changes and scarring in the right upper lobe. Blunting right costophrenic angle unchanged compatible with scarring from surgery. Spinal cord stimulator midthoracic spine unchanged No acute infiltrate.  No heart failure or edema.  IMPRESSION: COPD and scarring with postoperative changes on the right. No acute cardiopulmonary process. Electronically Signed   By: Franchot Gallo M.D.   On: 08/03/2021 15:21   [place] LT LOWER EXTREM LTD SOFT TISSUE NON VASCULAR  Result Date: 07/22/2021 CLINICAL DATA:  Lateral foot and ankle pain and swelling for 1 month EXAM: ULTRASOUND LEFT LOWER EXTREMITY LIMITED TECHNIQUE: Ultrasound examination of the lower extremity soft tissues was performed in the area of clinical concern. COMPARISON:  Left foot x-ray 09/15/2020 FINDINGS: Targeted ultrasound was performed of the soft tissues of the left lower extremity at site of patient's clinical concern. Images obtained of the lateral aspect of the left foot and left ankle. There is edema and ill-defined fluid within the subcutaneous soft tissues, most pronounced overlying the left ankle laterally. No organized fluid collection. Underlying peroneal tendons at this location appear intact. No significant tenosynovial fluid collection. IMPRESSION: Nonspecific soft tissue swelling and edema over the lateral left foot and ankle. No associated soft tissue or tenosynovial fluid collection. Electronically Signed   By: Davina Poke D.O.   On: 07/22/2021 19:58   CT Angio Chest/Abd/Pel for Dissection W and/or Wo Contrast  Result Date: 08/03/2021 CLINICAL DATA:  Acute aortic syndrome (AAS) suspected EXAM: CT ANGIOGRAPHY CHEST, ABDOMEN AND PELVIS TECHNIQUE: Non-contrast CT of the chest was initially obtained. Multidetector CT imaging through the chest, abdomen and pelvis was performed using the standard protocol during bolus administration of intravenous contrast. Multiplanar reconstructed images and MIPs were obtained and reviewed to evaluate the vascular anatomy. RADIATION DOSE REDUCTION: This exam was performed according to the departmental dose-optimization program which includes automated exposure control, adjustment of the mA and/or kV according to patient size and/or  use of iterative reconstruction technique. CONTRAST:  52mL OMNIPAQUE IOHEXOL 350 MG/ML SOLN COMPARISON:  CT abdomen pelvis 03/11/2021, ultrasound abdomen 06/29/2021, CT chest 06/27/2021 FINDINGS: CTA CHEST FINDINGS Cardiovascular: Normal heart size. No significant pericardial effusion. The thoracic aorta is normal in caliber. At least moderate atherosclerotic plaque of the thoracic aorta. At least 2 vessel coronary artery calcifications. The main pulmonary artery is normal in caliber. Enlarged right main pulmonary artery. No central or segmental pulmonary embolus. Mediastinum/Nodes: No enlarged mediastinal, hilar, or axillary lymph nodes. Subcentimeter right thyroid gland hypodensity. Not clinically significant; no follow-up imaging recommended (ref: J Am Coll Radiol. 2015 Feb;12(2): 143-50). Trachea and esophagus demonstrate no significant findings. Lungs/Pleura: Severe centrilobular emphysematous changes. Surgical changes related to right upper lobectomy. Chronic expansion of the right lower lobe. Interval development of a 1.2 x 1 2.6 cm nodular like consolidation within the right lower lobe (7:40). Interval increase in partial collapse of the right middle lobe with patchy airspace opacity. No pulmonary nodule. No pulmonary mass. No pleural effusion. No pneumothorax. Musculoskeletal: No chest wall abnormality. No suspicious lytic or blastic osseous lesions. No acute displaced fracture. Multilevel degenerative changes of the spine. Neural stimulator leads terminate in the posterior central canal at the T6 through T7 levels. Review of the MIP images confirms the above findings. CTA ABDOMEN AND PELVIS FINDINGS VASCULAR Aorta: Severe atherosclerotic plaque. Normal caliber aorta without aneurysm, dissection, vasculitis or significant stenosis. Celiac: Patent without evidence of aneurysm, dissection, vasculitis or significant stenosis. SMA: Patent without evidence of aneurysm, dissection, vasculitis or significant  stenosis. Renals: Both renal arteries are patent without evidence of aneurysm, dissection, vasculitis, fibromuscular dysplasia or significant stenosis. IMA: Patent without evidence of aneurysm, dissection, vasculitis or significant stenosis. Inflow: Atherosclerotic plaque. Patent without evidence of aneurysm, dissection, vasculitis or significant stenosis. Veins: No obvious venous abnormality within the limitations of this arterial phase study. Review of the MIP images confirms the above findings. NON-VASCULAR Hepatobiliary: No focal liver abnormality. No CT evidence of gallstones. Persistent gallbladder wall thickening. No definite pericholecystic fluid. Enlarged common bile duct measuring  up to 1.7 cm. Pancreas: No focal lesion. Normal pancreatic contour. No surrounding inflammatory changes. No main pancreatic ductal dilatation. Spleen: Normal in size without focal abnormality. Adrenals/Urinary Tract: No adrenal nodule bilaterally. Bilateral kidneys enhance symmetrically. Subcentimeter hypodensities bilaterally are too small to characterize. Pericentimeter fluid density lesions within bilateral kidneys statistically likely represent simple renal cysts. Simple renal cysts, in the absence of clinically indicated signs/symptoms, require no independent follow-up. There is a 1.1 cm hypodense lesion within the left kidney with a density of 29 Hounsfield units (5:108). No hydronephrosis. No hydroureter. The urinary bladder is unremarkable. Stomach/Bowel: Stomach is within normal limits. No evidence of bowel wall thickening or dilatation. The appendix is not definitely identified with no inflammatory changes in the right lower quadrant to suggest acute appendicitis. Lymphatic: Slightly limited evaluation of the to peritoneum due to streak artifact originating from the lumbar surgical hardware. No lymphadenopathy. Reproductive: Status post hysterectomy. No adnexal masses. Other: No intraperitoneal free fluid. No  intraperitoneal free gas. No organized fluid collection. Musculoskeletal: No abdominal wall hernia or abnormality. Diffusely decreased bone density. No suspicious lytic or blastic osseous lesions. No acute displaced fracture. Status post L3-L5 posterolateral and interbody fusion. Multilevel degenerative changes of the spine. Review of the MIP images confirms the above findings. IMPRESSION: 1. No aorta aneurysm or dissection. Aortic aneurysm NOS (ICD10-I71.9). 2. Interval development of a 1.2 x 1 2.6 cm nodular-like consolidation within the right lower lobe. Finding likely represents infection. Underlying malignancy not fully excluded but less likely given appearance of the right upper lobe 1 month ago. Additional imaging evaluation or consultation with Pulmonology or Thoracic Surgery recommended. 3. Interval increase in partial collapse and patchy airspace opacity of the right middle lobe. Finding may represent a combination of atelectasis versus infection/inflammation. 4. Status post right upper lobectomy. 5. Interval development of a 1.2 x 1 2.6 cm nodular like consolidation within the right lower lobe. 6. Enlarged common bile duct with associated persistent nonspecific gallbladder wall thickening. Gallbladder wall thickening can be seen in the setting of chronic liver disease versus acute cholecystitis. Correlate with liver function tests. 7. Indeterminate 1.1 cm left renal lesion. Finding may represent a minimally complex renal cyst versus solid renal lesion. Consider nonemergent further evaluation with MRI or CT renal protocol. 8. Aortic Atherosclerosis (ICD10-I70.0). Electronically Signed   By: Iven Finn M.D.   On: 08/03/2021 19:24   ECHOCARDIOGRAM LIMITED  Result Date: 08/04/2021    ECHOCARDIOGRAM LIMITED REPORT   Patient Name:   Jasmine [name] Date of Exam: 08/04/2021 Medical Rec #:  [id number]       Height:       63.0 in Accession #:    [id number]      Weight:       80.0 lb Date of Birth:  23-Nov-1955        BSA:          1.310 m Patient Age:    36 years        BP:           129/78 mmHg Patient Gender: F               HR:           80 bpm. Exam Location:  [facility] Procedure: Limited Echo Indications:    Chest Pain  History:        Patient has prior history of Echocardiogram examinations, most  recent 11/26/2020. CAD, COPD, Signs/Symptoms:Chest Pain; Risk                 Factors:Hypertension and Current Smoker. Lung CA, S/P Lobectomy.  Sonographer:    Wenda Low Referring Phys: [id number] [place]  1. Left ventricular ejection fraction, by estimation, is 60 to 65%. The left ventricle has normal function. The left ventricle has no regional wall motion abnormalities.  2. Right ventricular systolic function is normal. The right ventricular size is normal.  3. The inferior vena cava is normal in size with greater than 50% respiratory variability, suggesting right atrial pressure of 3 mmHg.  4. Limited echo to evaluate LV function and wall motion FINDINGS  Left Ventricle: Left ventricular ejection fraction, by estimation, is 60 to 65%. The left ventricle has normal function. The left ventricle has no regional wall motion abnormalities. The left ventricular internal cavity size was normal in size. There is  no left ventricular hypertrophy. Right Ventricle: The right ventricular size is normal. Right vetricular wall thickness was not well visualized. Right ventricular systolic function is normal. Pericardium: There is no evidence of pericardial effusion. Aorta: The aortic root is normal in size and structure. Venous: The inferior vena cava is normal in size with greater than 50% respiratory variability, suggesting right atrial pressure of 3 mmHg. LEFT VENTRICLE PLAX 2D LVIDd:         4.20 cm LVIDs:         2.50 cm LV PW:         0.70 cm LV IVS:        0.90 cm LVOT diam:     1.80 cm LVOT Area:     2.54 cm  LV Volumes (MOD) LV vol d, MOD A2C: 52.1 ml LV vol d, MOD A4C: 40.7 ml LV  vol s, MOD A2C: 21.1 ml LV vol s, MOD A4C: 17.2 ml LV SV MOD A2C:     31.0 ml LV SV MOD A4C:     40.7 ml LV SV MOD BP:      27.3 ml LEFT ATRIUM         Index LA diam:    3.00 cm 2.29 cm/m   AORTA Ao Root diam: 2.80 cm  SHUNTS Systemic Diam: 1.80 cm Carlyle Dolly MD Electronically signed by Carlyle Dolly MD Signature Date/Time: 08/04/2021/12:15:31 PM    Final     Roxan Hockey M.D on 08/05/2021 at 5:57 PM  Between 7am to 7pm - Pager - [phone]  After 7pm go to [email or web address] - password [facility]  [facility] -  Office  [phone]

## 2021-08-06 ENCOUNTER — Inpatient Hospital Stay (HOSPITAL_COMMUNITY): Payer: Medicare Other

## 2021-08-06 DIAGNOSIS — J449 Chronic obstructive pulmonary disease, unspecified: Secondary | ICD-10-CM | POA: Diagnosis not present

## 2021-08-06 DIAGNOSIS — J9611 Chronic respiratory failure with hypoxia: Secondary | ICD-10-CM | POA: Diagnosis not present

## 2021-08-06 DIAGNOSIS — R109 Unspecified abdominal pain: Secondary | ICD-10-CM | POA: Diagnosis not present

## 2021-08-06 DIAGNOSIS — J189 Pneumonia, unspecified organism: Secondary | ICD-10-CM | POA: Diagnosis not present

## 2021-08-06 MED ORDER — PROCHLORPERAZINE 25 MG RE SUPP
25.0000 mg | Freq: Once | RECTAL | Status: AC
  Filled 2021-08-06: qty 1

## 2021-08-06 NOTE — Evaluation (Signed)
Physical Therapy Evaluation Patient Details Name: Jasmine [name] MRN: [id number] DOB: 10-27-1955 Today's Date: 08/06/2021  History of Present Illness  Pt admitted on 5/31 with complaint of vomiting and diaherra.  Pt has hx of chronic pain and multiple admisisons to hospital.Jasmine [name] is a 66 y.o. female with medical history significant for  COPD, chronic hypoxic respiratory failure on 4 L, lung cancer status post right upper lobectomy, protein calorie malnutrition, and chronic pain.  Clinical Impression  PT adamant that she is not going to walk.  PT states that the MD stated all she had to do was get into the chair.  Pt becoming upset as therapist tries to convince pt to walk therefore transfer only.  PT is not happy about having to go home.  Therapist asked pt if she would be interested in SNF for short term rehab.  PT very much opposed to this stating that her husband does everything for her.     Recommendations for follow up therapy are one component of a multi-disciplinary discharge planning process, led by the attending physician.  Recommendations may be updated based on patient status, additional functional criteria and insurance authorization.  Follow Up Recommendations Home health PT    Assistance Recommended at Discharge  A little   Patient can return home with the following  A little help with walking and/or transfers;A little help with bathing/dressing/bathroom;Assistance with cooking/housework;Help with stairs or ramp for entrance    Equipment Recommendations None recommended by PT  Recommendations for Other Services    none   Functional Status Assessment Patient has had a recent decline in their functional status and demonstrates the ability to make significant improvements in function in a reasonable and predictable amount of time.     Precautions / Restrictions Precautions Precautions: Fall      Mobility  Bed Mobility Overal bed mobility: Independent                   Transfers Overall transfer level: Modified independent                      Ambulation/Gait               General Gait Details: PT refuses to ambulate; therapist had to talk pt into getting into chair.        Pertinent Vitals/Pain Pain Assessment Pain Assessment: 0-10 Pain Score: 8  Pain Location: chest Pain Descriptors / Indicators: Aching, Tightness Pain Intervention(s): Limited activity within patient's tolerance    Home Living Family/patient expects to be discharged to:: Private residence Living Arrangements: Spouse/significant other Available Help at Discharge: Family;Available 24 hours/day   Home Access: Stairs to enter Entrance Stairs-Rails: Right;Left;Can reach both Entrance Stairs-Number of Steps: 2 Alternate Level Stairs-Number of Steps: flight Home Layout: Able to live on main level with bedroom/bathroom;Two level Home Equipment: Rollator (4 wheels);Cane - single point;Shower seat;Grab bars - tub/shower;Grab bars - toilet;Wheelchair - manual;Hand held shower head Additional Comments: Has an AFO for prolong ambulation    Prior Function Prior Level of Function : Needs assist             Mobility Comments: Household ambulator using RW or SPC, for longer distances ues wheelchair ADLs Comments: assisted by family     Hand Dominance   Dominant Hand: Right    Extremity/Trunk Assessment        Lower Extremity Assessment Lower Extremity Assessment: Generalized weakness       Communication   Communication:  No difficulties  Cognition Arousal/Alertness: Awake/alert   Overall Cognitive Status: Within Functional Limits for tasks assessed                                                 Assessment/Plan    PT Assessment Patient needs continued PT services  PT Problem List Decreased strength;Decreased activity tolerance       PT Treatment Interventions Functional mobility training;Therapeutic  activities;Therapeutic exercise;Balance training    PT Goals (Current goals can be found in the Care Plan section)  Acute Rehab PT Goals Patient Stated Goal: not sure PT Goal Formulation: With patient    Frequency Min 3X/week        AM-PAC PT "6 Clicks" Mobility  Outcome Measure Help needed turning from your back to your side while in a flat bed without using bedrails?: None Help needed moving from lying on your back to sitting on the side of a flat bed without using bedrails?: None Help needed moving to and from a bed to a chair (including a wheelchair)?: A Little Help needed standing up from a chair using your arms (e.g., wheelchair or bedside chair)?: A Little Help needed to walk in hospital room?: A Little Help needed climbing 3-5 steps with a railing? : A Lot 6 Click Score: 19    End of Session Equipment Utilized During Treatment: Gait belt Activity Tolerance: Patient limited by pain;Treatment limited secondary to agitation Patient left: in bed;with call bell/phone within reach Nurse Communication: Mobility status PT Visit Diagnosis: Unsteadiness on feet (R26.81);Muscle weakness (generalized) (M62.81)    Time: 1130-1200 PT Time Calculation (min) (ACUTE ONLY): 30 min   Charges:   PT Evaluation $PT Eval Low Complexity: [place], PT CLT [phone]  08/06/2021, 12:03 PM

## 2021-08-06 NOTE — Progress Notes (Addendum)
   08/06/21 0558  Notify: Provider  Provider Name/Title Josephine Cables  Date Provider Notified 08/06/21  Time Provider Notified [phone]  Method of Notification  (secure chat)  Notification Reason Other (Comment) (pulse maintaing 110-120 when at rest, yellow mews)   EKG obtained by Respiratory, Dr. Josephine Cables notified and hard copy placed in front of patient chart.

## 2021-08-06 NOTE — Progress Notes (Signed)
PROGRESS NOTE                                                                                                                                                                                                          Patient Demographics:    Jasmine [name], is a 66 y.o. female, DOB - 1955/03/10, FSF:[id number]  Admit date - 08/03/2021   Admitting Physician Melda Mermelstein Denton Brick, MD  Outpatient Primary MD for the patient is Janora Norlander, DO  LOS - 2  Outpatient Specialists: Dr. Sydell Axon (GI) Dr. Tera Helper (oncologist)  Chief Complaint  Patient presents with   Abdominal Pain       Brief Narrative  Jasmine [name] is a 66 y.o. female with medical history significant for  COPD, chronic hypoxic respiratory failure on 4 L, lung cancer status post right upper lobectomy, protein calorie malnutrition, and chronic pain.  Patient presented to the ED with complaints of multiple episodes of vomiting and loose stools of 4 days duration.  She reports average of 8-10 episodes of vomiting daily, and 5-6 episodes of diarrhea also.  She reports poor oral intake in the past 4 days.  She reports onset of chest pain with her GI symptoms.  Chest pain is bilateral lower ribs and to the side, constant, and hurts when she breathes and coughs.  She reports mid abdominal pain.  She denies black stools.  She denies NSAID use and uses Tylenol and oxycodone/OxyContin.  She denies known cardiac history.  She reports chronic productive cough that is worse today.  She has chronic difficulty breathing, that is unchanged.  Patient has a history of chronic nausea.  She reports today reports vomiting and diarrhea have significantly reduced in frequency.  Hospitalization 4/25 to 4/30 also for abdominal pain and vomiting.  She has chronic CBD dilatation, MRI could not be obtained due to implanted stimulator.  GI recommended pancreatic enzymes, and that her CBD had been  dilated for years.   ED Course: Temperature 97.9.  Heart rate 101-130.  Respiratory rate 13 -18.  Blood pressure 334 -356 systolic.  O2 sats greater than 98% on room air.  Troponin 46 > 53.  Lactic acid 0.8.  Potassium 3.  Magnesium 1.6.  EKG without changes.  ED provider talked to cardiologist Dr. Virgina Jock, recommended starting heparin drip, admit to [facility].    I evaluated  patient and felt admission to [facility] was not needed at this time, sent a secure chat message to Dr. Virgina Jock, we agreed that patient can stay here for now.   Subjective:    Jasmine [name] today -Patient had episode of emesis -It appears it may have been posttussive -Persistent cough at times productive -dyspnea on exertion persist, has coughing spells with posttussive emesis -Dyspnea and hypoxia persist -Patient complains of significant fatigue and weakness -Chronic pain/back pain remains an issue -Episodes of tachycardia, no further chest pains  Assessment  & Plan :    Principal Problem:   PNA (pneumonia) Active Problems:   HTN (hypertension)   Chronic narcotic use   COPD GOLD II    Chronic respiratory failure with hypoxia (HCC)   Chest pain   Abdominal pain with vomiting   Pneumonia   RM/RLL Pneumonia/ HCAP urine legionella antigen pending Received Vanco/ Cefepime x1 in ED Cont Rocephin/ Zithromax along with bronchodilators and mucolytic -MRSA screen is  negative so we discontinued vancomycin -Persistent cough with posttussive emesis -Repeat chest x-ray with pneumonia, no obstructive bowel type findings  N/v, Abdominal pain/ Diarrhea ddx gastritis, gerd,  pancreatic insufficiency Chronic CBD dilation, unable to get MRI due to spinal stimulator Cont Creon Cont protonix 40mg  iv qday -Diarrhea improved -Required antiemetics  CP, atypical/ Elevated Trop  + coronary artery calcifications 2V on CTA chest 5/31 46>53> 50>31 -EKG was sinus without acute ST changes Initially treated with IV heparin   -Chest pain and troponin elevation pattern not consistent with ACS cardiology consult appreciated -On echo EF is 60 to 65%, without regional wall motion normalities -Continue Lipitor, change metoprolol to bisoprolol due to pulmonary/bronchospasms concerns -Remains chest pain-free   Chronic Respiratory Failure with hypoxia/ Copd on home o2 Cont Symbicort -> Dulera 2puff bid Cont Ventolin HFA-> Duoneb 1 q4h prn Cont Mucinex 1 po bid -PTA patient was on 4 L of oxygen via nasal cannula continuously -Patient admits to not really using oxygen at home most of the time because she had to smoke frequently - In the hospital patient at times refuses bronchodilators, compliance advised   Chronic narcotic use/Chronic back pain w sciatica/Chronic right hip pain Cont Oxycontin 15mg  po bid Cont oxycodone 10mg  po q4h prn  Cont Cymbalta 30mg  po qhs  Hypertension Stopped metoprolol,  use bisoprolol due to bronchospasms concern Cont Norvasc 10mg  po qday  Hypokalemia/Hypomagnesemia Replace and recheck  Macrocytic anemia, hx of iron deficiency -Serum B12 is not low -Serum folate is not low -Serum iron is not low  Insomnia Cont Remeron 7.5mg  po qhs Cont Trazodone 150mg  po qhs  1.1cm L renal lesion indeterminate Outpatient follow up recommended w MRI or CT  Generalized weakness/deconditioning--physical therapy eval appreciated recommends home health PT  Code Status : Full CODE  Family Communication  :   patient states that she will update her husband herself  Disposition Plan  : home with home health  Barriers For Discharge : Not medically ready  Consults  :  cardiology  Procedures  :  CTA chest 5/31  DVT Prophylaxis  :  Heparin   Lab Results  Component Value Date   PLT 248 08/05/2021    Antibiotics  :   Vanco iv x1 5/31, Cefepime iv x1 5/31 Rocephin, Zithromax 5/31->  Anti-infectives (From admission, onward)    Start     Dose/Rate Route Frequency Ordered Stop    08/04/21 2200  vancomycin (VANCOREADY) IVPB 500 mg/100 mL  Status:  Discontinued  See Hyperspace for full Linked Orders Report.   500 mg 100 mL/hr over 60 Minutes Intravenous Every 24 hours 08/03/21 2028 08/03/21 2248   08/04/21 0800  cefTRIAXone (ROCEPHIN) 2 g in sodium chloride 0.9 % 100 mL IVPB        2 g 200 mL/hr over 30 Minutes Intravenous Every 24 hours 08/03/21 2334     08/04/21 0030  azithromycin (ZITHROMAX) 500 mg in sodium chloride 0.9 % 250 mL IVPB        500 mg 250 mL/hr over 60 Minutes Intravenous Daily at bedtime 08/03/21 2334     08/03/21 2030  vancomycin (VANCOREADY) IVPB 750 mg/150 mL       See Hyperspace for full Linked Orders Report.   750 mg 150 mL/hr over 60 Minutes Intravenous  Once 08/03/21 2028 08/03/21 2249   08/03/21 2015  ceFEPIme (MAXIPIME) 1 g in sodium chloride 0.9 % 100 mL IVPB        1 g 200 mL/hr over 30 Minutes Intravenous  Once 08/03/21 2006 08/03/21 2145         Objective:   Vitals:   08/06/21 0526 08/06/21 0905 08/06/21 1133 08/06/21 1530  BP: (!) 139/98  128/79 (!) 142/82  Pulse: (!) 116 (!) 103 81 86  Resp: 16 17 20 19   Temp: 98.3 F (36.8 C)  98.2 F (36.8 C) 98.6 F (37 C)  TempSrc: Oral  Oral Oral  SpO2: 100% 99% 100% 100%  Weight:      Height:        Wt Readings from Last 3 Encounters:  08/03/21 36.3 kg  07/12/21 36.7 kg  07/11/21 37.3 kg     Intake/Output Summary (Last 24 hours) at 08/06/2021 1815 Last data filed at 08/06/2021 1700 Gross per 24 hour  Intake 745 ml  Output 1900 ml  Net -1155 ml   Physical Exam  Gen:- Awake Alert, in no acute distress , dyspnea on exertion persist, has coughing spells with posttussive emesis HEENT:- [place].AT, No sclera icterus Nose- [place] 4L/min Neck-Supple Neck,No JVD,.  Lungs-improving air movement, no rales  CV- S1, S2 normal, RRR Abd-  +ve B.Sounds, Abd Soft, No tenderness,    Extremity/Skin:- No  edema,   good pedal pulses  Psych-affect is appropriate, oriented  x3 Neuro-generalized weakness, no new focal deficits, no tremors    Data Review:    CBC Recent Labs  Lab 08/03/21 1426 08/04/21 0434 08/05/21 0508  WBC 7.4 7.4 4.9  HGB 11.3* 10.2* 10.0*  HCT 37.0 33.8* 32.7*  PLT 376 314 248  MCV 112.1* 115.0* 113.9*  MCH 34.2* 34.7* 34.8*  MCHC 30.5 30.2 30.6  RDW 14.3 14.0 13.9  LYMPHSABS 0.7  --   --   MONOABS 0.4  --   --   EOSABS 0.0  --   --   BASOSABS 0.0  --   --     Chemistries  Recent Labs  Lab 08/03/21 1426 08/03/21 1519 08/03/21 1947 08/04/21 0434 08/05/21 0508  NA 134*  --   --   --  138  K 3.0*  --   --   --  3.9  CL 98  --   --   --  104  CO2 23  --   --   --  28  GLUCOSE 106*  --   --   --  104*  BUN 9  --   --   --  8  CREATININE 0.56  --   --   --  0.31*  CALCIUM 8.9  --   --   --  8.7*  MG  --  1.6* 1.5* 2.1  --   AST 23  --   --   --  13*  ALT 15  --   --   --  10  ALKPHOS 122  --   --   --  76  BILITOT 1.2  --   --   --  0.5   ------------------------------------------------------------------------------------------------------------------ No results for input(s): CHOL, HDL, LDLCALC, TRIG, CHOLHDL, LDLDIRECT in the last 72 hours.  Lab Results  Component Value Date   HGBA1C 4.7 (L) 06/07/2021   ------------------------------------------------------------------------------------------------------------------ No results for input(s): TSH, T4TOTAL, T3FREE, THYROIDAB in the last 72 hours.  Invalid input(s): FREET3 ------------------------------------------------------------------------------------------------------------------ Recent Labs    08/05/21 0508  VITAMINB12 465  FOLATE 11.2  FERRITIN 280  TIBC 188*  IRON 50    Coagulation profile Recent Labs  Lab 08/03/21 1426  INR 0.9    No results for input(s): DDIMER in the last 72 hours.  Cardiac Enzymes No results for input(s): CKMB, TROPONINI, MYOGLOBIN in the last 168 hours.  Invalid input(s):  CK ------------------------------------------------------------------------------------------------------------------ No results found for: BNP  Inpatient Medications  Scheduled Meds:  amLODipine  10 mg Oral Daily   atorvastatin  40 mg Oral Daily   bisoprolol  5 mg Oral Daily   dextromethorphan-guaiFENesin  1 tablet Oral BID   DULoxetine  30 mg Oral QHS   heparin  5,000 Units Subcutaneous Q8H   lipase/protease/amylase  24,000-72,000 Units Oral TID AC   mirtazapine  7.5 mg Oral QHS   mometasone-formoterol  2 puff Inhalation BID   oxybutynin  5 mg Oral QHS   oxyCODONE  15 mg Oral Q12H   pantoprazole (PROTONIX) IV  40 mg Intravenous Q24H   Ensure Max Protein  1 Container Oral TID   traZODone  150 mg Oral QHS   umeclidinium bromide  1 puff Inhalation Daily   Continuous Infusions:  azithromycin 500 mg (08/05/21 2123)   cefTRIAXone (ROCEPHIN)  IV 2 g (08/06/21 0846)   PRN Meds:.acetaminophen **OR** acetaminophen, diphenhydrAMINE, hydrALAZINE, HYDROcodone bit-homatropine, ipratropium-albuterol, ondansetron **OR** ondansetron (ZOFRAN) IV, oxyCODONE-acetaminophen **AND** oxyCODONE, polyethylene glycol, prochlorperazine, tiZANidine  Micro Results Recent Results (from the past 240 hour(s))  Resp Panel by RT-PCR (Flu A&B, Covid) Anterior Nasal Swab     Status: None   Collection Time: 08/03/21  3:00 PM   Specimen: Anterior Nasal Swab  Result Value Ref Range Status   SARS Coronavirus 2 by RT PCR NEGATIVE NEGATIVE Final    Comment: (NOTE) SARS-CoV-2 target nucleic acids are NOT DETECTED.  The SARS-CoV-2 RNA is generally detectable in upper respiratory specimens during the acute phase of infection. The lowest concentration of SARS-CoV-2 viral copies this assay can detect is 138 copies/mL. A negative result does not preclude SARS-Cov-2 infection and should not be used as the sole basis for treatment or other patient management decisions. A negative result may occur with  improper specimen  collection/handling, submission of specimen other than nasopharyngeal swab, presence of viral mutation(s) within the areas targeted by this assay, and inadequate number of viral copies(<138 copies/mL). A negative result must be combined with clinical observations, patient history, and epidemiological information. The expected result is Negative.  Fact Sheet for Patients:  EntrepreneurPulse.com.au  Fact Sheet for Healthcare Providers:  IncredibleEmployment.be  This test is no t yet approved or cleared by the [place] FDA and  has been authorized for detection and/or diagnosis of SARS-CoV-2 by  FDA under an Emergency Use Authorization (EUA). This EUA will remain  in effect (meaning this test can be used) for the duration of the COVID-19 declaration under Section 564(b)(1) of the Act, 21 U.S.C.section 360bbb-3(b)(1), unless the authorization is terminated  or revoked sooner.       Influenza A by PCR NEGATIVE NEGATIVE Final   Influenza B by PCR NEGATIVE NEGATIVE Final    Comment: (NOTE) The Xpert Xpress SARS-CoV-2/FLU/RSV plus assay is intended as an aid in the diagnosis of influenza from Nasopharyngeal swab specimens and should not be used as a sole basis for treatment. Nasal washings and aspirates are unacceptable for Xpert Xpress SARS-CoV-2/FLU/RSV testing.  Fact Sheet for Patients: EntrepreneurPulse.com.au  Fact Sheet for Healthcare Providers: IncredibleEmployment.be  This test is not yet approved or cleared by the [place] FDA and has been authorized for detection and/or diagnosis of SARS-CoV-2 by FDA under an Emergency Use Authorization (EUA). This EUA will remain in effect (meaning this test can be used) for the duration of the COVID-19 declaration under Section 564(b)(1) of the Act, 21 U.S.C. section 360bbb-3(b)(1), unless the authorization is terminated or revoked.  Performed at [facility], [address] [place], [place] [address]   MRSA Next Gen by PCR, Nasal     Status: None   Collection Time: 08/04/21 10:28 AM   Specimen: Nasal Mucosa; Nasal Swab  Result Value Ref Range Status   MRSA by PCR Next Gen NOT DETECTED NOT DETECTED Final    Comment: (NOTE) The GeneXpert MRSA Assay (FDA approved for NASAL specimens only), is one component of a comprehensive MRSA colonization surveillance program. It is not intended to diagnose MRSA infection nor to guide or monitor treatment for MRSA infections. Test performance is not FDA approved in patients less than 47 years old. Performed at [facility], [address], [place], [place] [address]     Radiology Reports DG Chest 2 View  Result Date: 07/12/2021 CLINICAL DATA:  Lung cancer EXAM: CHEST - 2 VIEW COMPARISON:  Radiograph March 11, 2021 and chest CT June 27, 2021. FINDINGS: The heart size and mediastinal contours are within normal limits. Postsurgical change right upper lobectomy with right hilar chain staples and elevation right hilum, similar to prior chest CT. Right basilar pleural thickening appears similar to prior chest CT as well. No new focal airspace consolidation. Chronic parenchymal lung changes. Spinal stimulator leads. IMPRESSION: Postsurgical change right upper lobectomy. Right basilar pleural thickening appears similar to prior chest CT. No new focal airspace consolidation. Electronically Signed   By: Dahlia Bailiff M.D.   On: 07/12/2021 14:02   DG Chest Portable 1 View  Result Date: 08/03/2021 CLINICAL DATA:  Chest pain.  COPD EXAM: PORTABLE CHEST 1 VIEW COMPARISON:  07/12/2021 FINDINGS: COPD with hyperinflation and pulmonary scarring. Postsurgical changes and scarring in the right upper lobe. Blunting right costophrenic angle unchanged compatible with scarring from surgery. Spinal cord stimulator midthoracic spine unchanged No acute infiltrate.  No heart failure or edema. IMPRESSION: COPD and scarring with  postoperative changes on the right. No acute cardiopulmonary process. Electronically Signed   By: Franchot Gallo M.D.   On: 08/03/2021 15:21   DG ABD ACUTE 2+V W 1V CHEST  Result Date: 08/06/2021 CLINICAL DATA:  Dyspnea and respiratory abnormalities. EXAM: DG ABDOMEN ACUTE WITH 1 VIEW CHEST COMPARISON:  CTA chest, abdomen and pelvis 08/03/2021 and chest radiograph 07/12/2021 FINDINGS: Again noted are emphysematous changes in the lungs with postoperative changes in the right lung. Patchy densities in  the right upper lung near the apex are similar to the recent chest CT but new since 07/12/2021. Heart and mediastinum are within normal limits and stable. Spinal cord stimulator in the thoracic spine. Negative for free air. Again noted is bowel gas along the left side of the abdomen and pelvis and similar to the recent CT. Nonobstructive bowel gas pattern. Posterior interbody fusion in the lower lumbar spine. IMPRESSION: 1. Postoperative changes in the right lung with persistent patchy densities near the right lung apex. This represents a change since 07/12/2021 but similar to the exam from 08/03/2021. Suspect these densities are infectious or inflammatory in etiology. 2. Nonobstructive bowel gas pattern. 3. Postsurgical changes as described. Electronically Signed   By: Markus Daft M.D.   On: 08/06/2021 10:04   [place] LT LOWER EXTREM LTD SOFT TISSUE NON VASCULAR  Result Date: 07/22/2021 CLINICAL DATA:  Lateral foot and ankle pain and swelling for 1 month EXAM: ULTRASOUND LEFT LOWER EXTREMITY LIMITED TECHNIQUE: Ultrasound examination of the lower extremity soft tissues was performed in the area of clinical concern. COMPARISON:  Left foot x-ray 09/15/2020 FINDINGS: Targeted ultrasound was performed of the soft tissues of the left lower extremity at site of patient's clinical concern. Images obtained of the lateral aspect of the left foot and left ankle. There is edema and ill-defined fluid within the subcutaneous soft  tissues, most pronounced overlying the left ankle laterally. No organized fluid collection. Underlying peroneal tendons at this location appear intact. No significant tenosynovial fluid collection. IMPRESSION: Nonspecific soft tissue swelling and edema over the lateral left foot and ankle. No associated soft tissue or tenosynovial fluid collection. Electronically Signed   By: Davina Poke D.O.   On: 07/22/2021 19:58   CT Angio Chest/Abd/Pel for Dissection W and/or Wo Contrast  Result Date: 08/03/2021 CLINICAL DATA:  Acute aortic syndrome (AAS) suspected EXAM: CT ANGIOGRAPHY CHEST, ABDOMEN AND PELVIS TECHNIQUE: Non-contrast CT of the chest was initially obtained. Multidetector CT imaging through the chest, abdomen and pelvis was performed using the standard protocol during bolus administration of intravenous contrast. Multiplanar reconstructed images and MIPs were obtained and reviewed to evaluate the vascular anatomy. RADIATION DOSE REDUCTION: This exam was performed according to the departmental dose-optimization program which includes automated exposure control, adjustment of the mA and/or kV according to patient size and/or use of iterative reconstruction technique. CONTRAST:  79mL OMNIPAQUE IOHEXOL 350 MG/ML SOLN COMPARISON:  CT abdomen pelvis 03/11/2021, ultrasound abdomen 06/29/2021, CT chest 06/27/2021 FINDINGS: CTA CHEST FINDINGS Cardiovascular: Normal heart size. No significant pericardial effusion. The thoracic aorta is normal in caliber. At least moderate atherosclerotic plaque of the thoracic aorta. At least 2 vessel coronary artery calcifications. The main pulmonary artery is normal in caliber. Enlarged right main pulmonary artery. No central or segmental pulmonary embolus. Mediastinum/Nodes: No enlarged mediastinal, hilar, or axillary lymph nodes. Subcentimeter right thyroid gland hypodensity. Not clinically significant; no follow-up imaging recommended (ref: J Am Coll Radiol. 2015 Feb;12(2):  143-50). Trachea and esophagus demonstrate no significant findings. Lungs/Pleura: Severe centrilobular emphysematous changes. Surgical changes related to right upper lobectomy. Chronic expansion of the right lower lobe. Interval development of a 1.2 x 1 2.6 cm nodular like consolidation within the right lower lobe (7:40). Interval increase in partial collapse of the right middle lobe with patchy airspace opacity. No pulmonary nodule. No pulmonary mass. No pleural effusion. No pneumothorax. Musculoskeletal: No chest wall abnormality. No suspicious lytic or blastic osseous lesions. No acute displaced fracture. Multilevel degenerative changes of the spine. Neural stimulator  leads terminate in the posterior central canal at the T6 through T7 levels. Review of the MIP images confirms the above findings. CTA ABDOMEN AND PELVIS FINDINGS VASCULAR Aorta: Severe atherosclerotic plaque. Normal caliber aorta without aneurysm, dissection, vasculitis or significant stenosis. Celiac: Patent without evidence of aneurysm, dissection, vasculitis or significant stenosis. SMA: Patent without evidence of aneurysm, dissection, vasculitis or significant stenosis. Renals: Both renal arteries are patent without evidence of aneurysm, dissection, vasculitis, fibromuscular dysplasia or significant stenosis. IMA: Patent without evidence of aneurysm, dissection, vasculitis or significant stenosis. Inflow: Atherosclerotic plaque. Patent without evidence of aneurysm, dissection, vasculitis or significant stenosis. Veins: No obvious venous abnormality within the limitations of this arterial phase study. Review of the MIP images confirms the above findings. NON-VASCULAR Hepatobiliary: No focal liver abnormality. No CT evidence of gallstones. Persistent gallbladder wall thickening. No definite pericholecystic fluid. Enlarged common bile duct measuring up to 1.7 cm. Pancreas: No focal lesion. Normal pancreatic contour. No surrounding inflammatory  changes. No main pancreatic ductal dilatation. Spleen: Normal in size without focal abnormality. Adrenals/Urinary Tract: No adrenal nodule bilaterally. Bilateral kidneys enhance symmetrically. Subcentimeter hypodensities bilaterally are too small to characterize. Pericentimeter fluid density lesions within bilateral kidneys statistically likely represent simple renal cysts. Simple renal cysts, in the absence of clinically indicated signs/symptoms, require no independent follow-up. There is a 1.1 cm hypodense lesion within the left kidney with a density of 29 Hounsfield units (5:108). No hydronephrosis. No hydroureter. The urinary bladder is unremarkable. Stomach/Bowel: Stomach is within normal limits. No evidence of bowel wall thickening or dilatation. The appendix is not definitely identified with no inflammatory changes in the right lower quadrant to suggest acute appendicitis. Lymphatic: Slightly limited evaluation of the to peritoneum due to streak artifact originating from the lumbar surgical hardware. No lymphadenopathy. Reproductive: Status post hysterectomy. No adnexal masses. Other: No intraperitoneal free fluid. No intraperitoneal free gas. No organized fluid collection. Musculoskeletal: No abdominal wall hernia or abnormality. Diffusely decreased bone density. No suspicious lytic or blastic osseous lesions. No acute displaced fracture. Status post L3-L5 posterolateral and interbody fusion. Multilevel degenerative changes of the spine. Review of the MIP images confirms the above findings. IMPRESSION: 1. No aorta aneurysm or dissection. Aortic aneurysm NOS (ICD10-I71.9). 2. Interval development of a 1.2 x 1 2.6 cm nodular-like consolidation within the right lower lobe. Finding likely represents infection. Underlying malignancy not fully excluded but less likely given appearance of the right upper lobe 1 month ago. Additional imaging evaluation or consultation with Pulmonology or Thoracic Surgery  recommended. 3. Interval increase in partial collapse and patchy airspace opacity of the right middle lobe. Finding may represent a combination of atelectasis versus infection/inflammation. 4. Status post right upper lobectomy. 5. Interval development of a 1.2 x 1 2.6 cm nodular like consolidation within the right lower lobe. 6. Enlarged common bile duct with associated persistent nonspecific gallbladder wall thickening. Gallbladder wall thickening can be seen in the setting of chronic liver disease versus acute cholecystitis. Correlate with liver function tests. 7. Indeterminate 1.1 cm left renal lesion. Finding may represent a minimally complex renal cyst versus solid renal lesion. Consider nonemergent further evaluation with MRI or CT renal protocol. 8. Aortic Atherosclerosis (ICD10-I70.0). Electronically Signed   By: Iven Finn M.D.   On: 08/03/2021 19:24   ECHOCARDIOGRAM LIMITED  Result Date: 08/04/2021    ECHOCARDIOGRAM LIMITED REPORT   Patient Name:   Jasmine [name] Date of Exam: 08/04/2021 Medical Rec #:  [id number]       Height:  63.0 in Accession #:    [id number]      Weight:       80.0 lb Date of Birth:  1955-07-31       BSA:          1.310 m Patient Age:    32 years        BP:           129/78 mmHg Patient Gender: F               HR:           80 bpm. Exam Location:  [facility] Procedure: Limited Echo Indications:    Chest Pain  History:        Patient has prior history of Echocardiogram examinations, most                 recent 11/26/2020. CAD, COPD, Signs/Symptoms:Chest Pain; Risk                 Factors:Hypertension and Current Smoker. Lung CA, S/P Lobectomy.  Sonographer:    Wenda Low Referring Phys: [id number] [place]  1. Left ventricular ejection fraction, by estimation, is 60 to 65%. The left ventricle has normal function. The left ventricle has no regional wall motion abnormalities.  2. Right ventricular systolic function is normal. The right ventricular size  is normal.  3. The inferior vena cava is normal in size with greater than 50% respiratory variability, suggesting right atrial pressure of 3 mmHg.  4. Limited echo to evaluate LV function and wall motion FINDINGS  Left Ventricle: Left ventricular ejection fraction, by estimation, is 60 to 65%. The left ventricle has normal function. The left ventricle has no regional wall motion abnormalities. The left ventricular internal cavity size was normal in size. There is  no left ventricular hypertrophy. Right Ventricle: The right ventricular size is normal. Right vetricular wall thickness was not well visualized. Right ventricular systolic function is normal. Pericardium: There is no evidence of pericardial effusion. Aorta: The aortic root is normal in size and structure. Venous: The inferior vena cava is normal in size with greater than 50% respiratory variability, suggesting right atrial pressure of 3 mmHg. LEFT VENTRICLE PLAX 2D LVIDd:         4.20 cm LVIDs:         2.50 cm LV PW:         0.70 cm LV IVS:        0.90 cm LVOT diam:     1.80 cm LVOT Area:     2.54 cm  LV Volumes (MOD) LV vol d, MOD A2C: 52.1 ml LV vol d, MOD A4C: 40.7 ml LV vol s, MOD A2C: 21.1 ml LV vol s, MOD A4C: 17.2 ml LV SV MOD A2C:     31.0 ml LV SV MOD A4C:     40.7 ml LV SV MOD BP:      27.3 ml LEFT ATRIUM         Index LA diam:    3.00 cm 2.29 cm/m   AORTA Ao Root diam: 2.80 cm  SHUNTS Systemic Diam: 1.80 cm Carlyle Dolly MD Electronically signed by Carlyle Dolly MD Signature Date/Time: 08/04/2021/12:15:31 PM    Final     Roxan Hockey M.D on 08/06/2021 at 6:15 PM  Between 7am to 7pm - Pager - [phone]  After 7pm go to [email or web address] - password [facility]  [facility] -  Office  [phone]

## 2021-08-06 NOTE — Progress Notes (Signed)
   08/06/21 0553  Assess: if the MEWS score is Yellow or Red  Were vital signs taken at a resting state? Yes  Treat  MEWS Interventions Other (Comment) (notified provider through secure chat)  Pain Scale 0-10  Pain Score Asleep  Take Vital Signs  Increase Vital Sign Frequency  Yellow: Q 2hr X 2 then Q 4hr X 2, if remains yellow, continue Q 4hrs  Notify: Charge Nurse/RN  Name of Charge Nurse/RN Notified Tina, RN  Date Charge Nurse/RN Notified 08/06/21  Time Charge Nurse/RN Notified [phone]

## 2021-08-06 NOTE — Progress Notes (Signed)
0905 Patient declined Ruthe Mannan and Incruse at this time stating "they might make me vomit."

## 2021-08-07 DIAGNOSIS — F1190 Opioid use, unspecified, uncomplicated: Secondary | ICD-10-CM | POA: Diagnosis not present

## 2021-08-07 DIAGNOSIS — J189 Pneumonia, unspecified organism: Secondary | ICD-10-CM | POA: Diagnosis not present

## 2021-08-07 DIAGNOSIS — J9611 Chronic respiratory failure with hypoxia: Secondary | ICD-10-CM | POA: Diagnosis not present

## 2021-08-07 DIAGNOSIS — R079 Chest pain, unspecified: Secondary | ICD-10-CM | POA: Diagnosis not present

## 2021-08-07 MED ORDER — CEFDINIR 300 MG PO CAPS: 300.0000 mg | ORAL_CAPSULE | Freq: Two times a day (BID) | ORAL | 0 refills | Status: AC

## 2021-08-07 MED ORDER — SPIRIVA RESPIMAT 2.5 MCG/ACT IN AERS: 2.0000 | INHALATION_SPRAY | Freq: Every day | RESPIRATORY_TRACT | 3 refills | Status: DC

## 2021-08-07 MED ORDER — BISOPROLOL FUMARATE 5 MG PO TABS
2.5000 mg | ORAL_TABLET | Freq: Every day | ORAL | 2 refills | Status: DC
Start: 2021-08-07 — End: 2022-06-06

## 2021-08-07 MED ORDER — AMLODIPINE BESYLATE 10 MG PO TABS: 10.0000 mg | ORAL_TABLET | Freq: Every day | ORAL | 5 refills | Status: DC

## 2021-08-07 MED ORDER — TIZANIDINE HCL 4 MG PO CAPS: 4.0000 mg | ORAL_CAPSULE | Freq: Three times a day (TID) | ORAL | 3 refills | Status: DC | PRN

## 2021-08-07 MED ORDER — ALBUTEROL SULFATE HFA 108 (90 BASE) MCG/ACT IN AERS
2.0000 | INHALATION_SPRAY | Freq: Four times a day (QID) | RESPIRATORY_TRACT | 3 refills | Status: DC | PRN
Start: 2021-08-07 — End: 2021-08-26

## 2021-08-07 MED ORDER — AZITHROMYCIN 250 MG PO TABS
500.0000 mg | ORAL_TABLET | Freq: Once | ORAL | Status: AC
  Administered 2021-08-07: 500 mg via ORAL
  Filled 2021-08-07: qty 2

## 2021-08-07 MED ORDER — PROCHLORPERAZINE 25 MG RE SUPP: 25.0000 mg | Freq: Two times a day (BID) | RECTAL | 3 refills | Status: DC | PRN

## 2021-08-07 MED ORDER — PREDNISONE 20 MG PO TABS: 40.0000 mg | ORAL_TABLET | Freq: Every day | ORAL | 0 refills | Status: AC

## 2021-08-07 MED ORDER — METHYLPREDNISOLONE SODIUM SUCC 125 MG IJ SOLR
125.0000 mg | Freq: Once | INTRAMUSCULAR | Status: AC
  Administered 2021-08-07: 125 mg via INTRAVENOUS
  Filled 2021-08-07: qty 2

## 2021-08-07 MED ORDER — POLYETHYLENE GLYCOL 3350 17 G PO PACK: 17.0000 g | PACK | Freq: Every day | ORAL | 3 refills | Status: DC

## 2021-08-07 MED ORDER — NICOTINE 21 MG/24HR TD PT24: 21.0000 mg | MEDICATED_PATCH | TRANSDERMAL | 0 refills | Status: AC

## 2021-08-07 MED ORDER — FLUTICASONE-SALMETEROL 250-50 MCG/ACT IN AEPB: 1.0000 | INHALATION_SPRAY | Freq: Two times a day (BID) | RESPIRATORY_TRACT | 3 refills | Status: DC

## 2021-08-07 NOTE — Progress Notes (Signed)
Patient slept most of night. No complaints of nausea reports. No PRN medications requested for nausea thus far this shift

## 2021-08-07 NOTE — Discharge Summary (Signed)
Jasmine [name], is a 66 y.o. female  DOB [birth year]  MRN [id number].  Admission date:  08/03/2021  Admitting Physician  Roxan Hockey, MD  Discharge Date:  08/07/2021   Primary MD  Janora Norlander, DO  Recommendations for primary care physician for things to follow:   1)You need oxygen at home at 3 L via nasal cannula continuously while awake and while asleep--- smoking or having open fires around oxygen can cause fire, significant injury and death  2)Complete smoking cessation advised--- May use nicotine patch to help you quit smoking  3)Stop metoprolol and take bisoprolol as prescribed instead  4)Follow up with Janora Norlander, DO (primary care physician) in 1 to 2 weeks for recheck and reevaluation  5)Please note that there has been several changes to your medications  Admission Diagnosis  Elevated troponin [R77.8] PNA (pneumonia) [J18.9] Nausea and vomiting, unspecified vomiting type [R11.2] Pneumonia [J18.9]   Discharge Diagnosis  Elevated troponin [R77.8] PNA (pneumonia) [J18.9] Nausea and vomiting, unspecified vomiting type [R11.2] Pneumonia [J18.9]    Principal Problem:   PNA (pneumonia) Active Problems:   HTN (hypertension)   Chronic narcotic use   COPD GOLD II    Chronic respiratory failure with hypoxia (HCC)   Chest pain   Abdominal pain with vomiting   Pneumonia     Past Medical History:  Diagnosis Date   Allergy    Anemia    Anxiety    Arthritis    Asthma    Carpal tunnel syndrome    Chronic back pain    Cigarette smoker 09/02/4763   Complication of anesthesia    in the past has had N/V, the last few surgeries she has not been   COPD (chronic obstructive pulmonary disease) ([place])    Easy bruising 07/10/2016   GERD (gastroesophageal reflux disease)    Headache    Heart murmur 2005   never had any problems   History of blood transfusion 2018   History of  kidney stones    Hypertension    Hypoglycemia    Hypoglycemia    Iron deficiency anemia 07/11/2016   Neuropathy    both arms   Normocytic anemia 07/29/2015   Pancreatitis    Pneumonia    PONV (postoperative nausea and vomiting)    Postoperative anemia due to acute blood loss 11/15/2015   R lung cancer    Sciatica    Seizures ([place])    as a child when she would have an asthma attack. none as an adult   Shortness of breath dyspnea     Past Surgical History:  Procedure Laterality Date   ABDOMINAL HYSTERECTOMY  4650   APPLICATION OF ROBOTIC ASSISTANCE FOR SPINAL PROCEDURE N/A 09/05/2016   Procedure: APPLICATION OF ROBOTIC ASSISTANCE FOR SPINAL PROCEDURE;  Surgeon: Consuella Lose, MD;  Location: [place];  Service: Neurosurgery;  Laterality: N/A;   BACK SURGERY     BIOPSY  06/14/2020   Procedure: BIOPSY;  Surgeon: Daneil Dolin, MD;  [facility];  Service: Endoscopy;;   CERVICAL FUSION     CESAREAN SECTION  1982   COLONOSCOPY     unsure last   COLONOSCOPY WITH PROPOFOL N/A 06/14/2020   Procedure: COLONOSCOPY WITH PROPOFOL;  Surgeon: Daneil Dolin, MD;  [facility];  Service: Endoscopy;  Laterality: N/A;  PM   cyst removal face     disk repair     neck and lumbar region   ESOPHAGOGASTRODUODENOSCOPY (EGD) WITH PROPOFOL N/A 06/14/2020   Procedure: ESOPHAGOGASTRODUODENOSCOPY (EGD) WITH PROPOFOL;  Surgeon: Daneil Dolin, MD;  [facility];  Service: Endoscopy;  Laterality: N/A;   FOOT SURGERY Bilateral    to file bones down    HARDWARE REMOVAL N/A 09/05/2016   Procedure: HARDWARE REMOVAL AND REPLACEMENT OF LUMBAR FIVE SCREWS;  Surgeon: Consuella Lose, MD;  Location: [place];  Service: Neurosurgery;  Laterality: N/A;   IMPLANTATION / PLACEMENT EPIDURAL NEUROSTIMULATOR ELECTRODES     INTERCOSTAL NERVE BLOCK Right 07/29/2020   Procedure: INTERCOSTAL NERVE BLOCK;  Surgeon: Melrose Nakayama, MD;  Location: [place];  Service: Thoracic;   Laterality: Right;   LAMINECTOMY     2006   lobectomy Right 07/2020   LUMBAR FUSION     LYMPH NODE BIOPSY Right 07/29/2020   Procedure: LYMPH NODE BIOPSY;  Surgeon: Melrose Nakayama, MD;  Location: [place];  Service: Thoracic;  Laterality: Right;   POLYPECTOMY  06/14/2020   Procedure: POLYPECTOMY INTESTINAL;  Surgeon: Daneil Dolin, MD;  [facility];  Service: Endoscopy;;   SPINAL CORD STIMULATOR BATTERY EXCHANGE     SPINAL CORD STIMULATOR INSERTION N/A 07/05/2016   Procedure: REPLACEMENT OF LUMBAR SPINAL CORD STIMULATOR BATTERY;  Surgeon: Newman Pies, MD;  Location: [place];  Service: Neurosurgery;  Laterality: N/A;   TONSILLECTOMY       HPI  from the history and physical done on the day of admission:   Chief Complaint: Vomiting, chest pain   HPI: NEALA MIGGINS is a 66 y.o. female with medical history significant for  COPD, chronic hypoxic respiratory failure on 4 L, lung cancer status post right upper lobectomy, protein calorie malnutrition, and chronic pain.  Patient presented to the ED with complaints of multiple episodes of vomiting and loose stools of 4 days duration.  She reports average of 8-10 episodes of vomiting daily, and 5-6 episodes of diarrhea also.  She reports poor oral intake in the past 4 days.  She reports onset of chest pain with her GI symptoms.  Chest pain is bilateral lower ribs and to the side, constant, and hurts when she breathes and coughs.  She reports mid abdominal pain.  She denies black stools.  She denies NSAID use and uses Tylenol and oxycodone/OxyContin.  She denies known cardiac history.  She reports chronic productive cough that is worse today.  She has chronic difficulty breathing, that is unchanged.  Patient has a history of chronic nausea.  She reports today reports vomiting and diarrhea have significantly reduced in frequency.   Hospitalization 4/25 to 4/30 also for abdominal pain and vomiting.  She has chronic CBD dilatation, MRI  could not be obtained due to implanted stimulator.  GI recommended pancreatic enzymes, and that her CBD had been dilated for years.   ED Course: Temperature 97.9.  Heart rate 101-130.  Respiratory rate 13 -18.  Blood pressure 379 -024 systolic.  O2 sats greater than 98% on room air.  Troponin 46 > 53.  Lactic acid 0.8.  Potassium 3.  Magnesium 1.6.  EKG without changes.  ED provider talked to cardiologist Dr. Virgina Jock, recommended starting heparin drip, admit to [facility].    I evaluated patient and felt admission to [facility] was not needed at this time, sent a secure chat message to Dr. Virgina Jock, we agreed that patient can stay here for now.   Review of Systems: As per HPI all other systems reviewed and negative.     Hospital Course:    Assessment and Plan: RM/RLL Pneumonia/ HCAP urine legionella antigen still pending Received Vanco/ Cefepime x1 in ED Treated with Rocephin/ Zithromax along with bronchodilators and mucolytic -MRSA screen is  negative so we discontinued vancomycin -Repeat chest x-ray from 08/06/2021 stable  Abdominal x-rays without obstructive bowel type findings -Cough and dyspnea improved significantly -Okay to discharge home on prednisone, and Omnicef along with bronchodilators and mucolytics   N/v, Abdominal pain/ Diarrhea ddx gastritis, gerd,  pancreatic insufficiency Chronic CBD dilation, unable to get MRI due to spinal stimulator Cont Creon -Diarrhea resolved Okay to discharge with antiemetic   CP, atypical/ Elevated Trop  + coronary artery calcifications 2V on CTA chest 5/31 46>53> 50>31 -EKG was sinus without acute ST changes Initially treated with IV heparin  -Chest pain and troponin elevation pattern not consistent with ACS cardiology consult appreciated -On echo EF is 60 to 65%, without regional wall motion normalities -Continue Lipitor, change metoprolol to bisoprolol due to pulmonary/bronchospasms concerns -Remains chest pain-free     Chronic  Respiratory Failure with hypoxia/ Copd on home o2 -PTA patient was on 3 to 4 L of oxygen via nasal cannula continuously -Discharge home on bronchodilators and prednisone -Patient admits to not really using oxygen at home most of the time because she had to smoke frequently   Chronic narcotic use/Chronic back pain w sciatica/Chronic right hip pain Cont Oxycontin 15mg  po bid Cont oxycodone 10mg  po q4h prn  Cont Cymbalta 30mg  po qhs -Follow-up with pain specialist -Reduce dose of Zanaflex   Hypertension Stopped metoprolol,  use bisoprolol due to bronchospasms concern Cont Norvasc 10mg  po qday   Hypokalemia/Hypomagnesemia Replaced     Macrocytic anemia, hx of iron deficiency -Serum B12 is not low -Serum folate is not low -Serum iron is not low   Insomnia Cont Remeron 7.5mg  po qhs Cont Trazodone 150mg  po qhs   1.1cm L renal lesion indeterminate Outpatient follow up recommended w MRI or CT -----patient aware and patient will follow-up with PCP   Generalized weakness/deconditioning--physical therapy eval appreciated recommends home health PT   Code Status : Full CODE   Family Communication  :   patient states that she will update her husband herself   Disposition Plan  : home with home health   Consults  :  cardiology  Discharge Condition: stable  Follow UP--- PCP  Diet and Activity recommendation:  As advised  Discharge Instructions    Discharge Instructions     Call MD for:  difficulty breathing, headache or visual disturbances   Complete by: As directed    Call MD for:  persistant dizziness or light-headedness   Complete by: As directed    Call MD for:  persistant nausea and vomiting   Complete by: As directed    Call MD for:  temperature >100.4   Complete by: As directed    Diet - low sodium heart healthy   Complete by: As directed    Discharge instructions   Complete by: As directed    1)You need oxygen at home at 3  L via nasal cannula continuously while  awake and while asleep--- smoking or having open fires around oxygen can cause fire, significant injury and death  2)Complete smoking cessation advised--- May use nicotine patch to help you quit smoking  3)Stop metoprolol and take bisoprolol as prescribed instead  4)Follow up with Janora Norlander, DO (primary care physician) in 1 to 2 weeks for recheck and reevaluation  5) please note that there has been several changes to your medications   Increase activity slowly   Complete by: As directed        Discharge Medications     Allergies as of 08/07/2021       Reactions   Lyrica [pregabalin] Other (See Comments)   EXTREME SHAKING/TREMBLING   Darvon [propoxyphene]    UNSPECIFIED REACTION    Gabapentin    Extreme shaking and trembling    Augmentin [amoxicillin-pot Clavulanate] Nausea And Vomiting   Has patient had a PCN reaction causing immediate rash, facial/tongue/throat swelling, SOB or lightheadedness with hypotension:no Has patient had a PCN reaction causing severe rash involving mucus membranes or skin necrosis:No Has patient had a PCN reaction that required hospitalization:No Has patient had a PCN reaction occurring within the last 10 years:Yes--ONLY N/V If all of the above answers are "NO", then may proceed with Cephalosporin use.   Erythromycin Itching, Rash      Vibramycin [doxycycline Calcium] Itching, Rash   "SPLOTCHING "        Medication List     STOP taking these medications    budesonide-formoterol 160-4.5 MCG/ACT inhaler Commonly known as: SYMBICORT Replaced by: fluticasone-salmeterol 250-50 MCG/ACT Aepb   metoprolol tartrate 25 MG tablet Commonly known as: LOPRESSOR   promethazine 25 MG suppository Commonly known as: PHENERGAN       TAKE these medications    albuterol 108 (90 Base) MCG/ACT inhaler Commonly known as: VENTOLIN HFA Inhale 2 puffs into the lungs every 6 (six) hours as needed for wheezing or shortness of breath.   amLODipine  10 MG tablet Commonly known as: NORVASC Take 1 tablet (10 mg total) by mouth daily.   atorvastatin 40 MG tablet Commonly known as: Lipitor Take 1 tablet (40 mg total) by mouth daily.   bisoprolol 5 MG tablet Commonly known as: ZEBETA Take 0.5 tablets (2.5 mg total) by mouth daily. Take in Place of Metoprolol   cefdinir 300 MG capsule Commonly known as: OMNICEF Take 1 capsule (300 mg total) by mouth 2 (two) times daily for 5 days.   Cholecalciferol 125 MCG (5000 UT) capsule Take 5,000 Units by mouth daily.   dextromethorphan 30 MG/5ML liquid Commonly known as: DELSYM Take 15 mg by mouth as needed for cough.   dextromethorphan-guaiFENesin 30-600 MG 12hr tablet Commonly known as: MUCINEX DM Take 1 tablet by mouth 2 (two) times daily.   DULoxetine 30 MG capsule Commonly known as: CYMBALTA Take 3 capsules (90 mg total) by mouth daily. What changed:  how much to take when to take this   Ensure High Protein Liqd Drink 1 can 3 times daily.   fluticasone-salmeterol 250-50 MCG/ACT Aepb Commonly known as: Advair Diskus Inhale 1 puff into the lungs in the morning and at bedtime. Replaces: budesonide-formoterol 160-4.5 MCG/ACT inhaler   mirtazapine 7.5 MG tablet Commonly known as: REMERON Take 1 tablet (7.5 mg total) by mouth at bedtime. Cross taper with 30mg  of Cymbalta for 3-4 weeks. Then stop cymbalta   nicotine 21 mg/24hr patch Commonly known as: NICODERM CQ - dosed in mg/24 hours  Place 1 patch (21 mg total) onto the skin daily for 28 days.   omeprazole 40 MG capsule Commonly known as: PRILOSEC Take one capsule once to twice daily before a meal.   oxybutynin 5 MG 24 hr tablet Commonly known as: DITROPAN-XL TAKE 1 TABLET(5 MG) BY MOUTH AT BEDTIME What changed: See the new instructions.   oxyCODONE-acetaminophen 10-325 MG tablet Commonly known as: PERCOCET Take 1 tablet by mouth 5 (five) times daily.   OxyCONTIN 15 mg 12 hr tablet Generic drug: oxyCODONE Take 15  mg by mouth every 12 (twelve) hours.   OXYGEN Inhale 4 L into the lungs See admin instructions. continuous   Pancrelipase (Lip-Prot-Amyl) 24000-76000 units Cpep Take 2 caps daily with meals and 1 cap with snacks   polyethylene glycol 17 g packet Commonly known as: MiraLax Take 17 g by mouth daily.   predniSONE 20 MG tablet Commonly known as: DELTASONE Take 2 tablets (40 mg total) by mouth daily with breakfast for 5 days.   prochlorperazine 25 MG suppository Commonly known as: COMPAZINE Place 1 suppository (25 mg total) rectally every 12 (twelve) hours as needed for nausea or vomiting.   Spiriva Respimat 2.5 MCG/ACT Aers Generic drug: Tiotropium Bromide Monohydrate Inhale 2 puffs into the lungs daily.   tiZANidine 4 MG capsule Commonly known as: ZANAFLEX Take 1 capsule (4 mg total) by mouth 3 (three) times daily as needed for muscle spasms. What changed:  medication strength how much to take   traZODone 150 MG tablet Commonly known as: DESYREL TAKE 1 TO 2 TABLETS AT BEDTIME What changed: See the new instructions.       Major procedures and Radiology Reports - PLEASE review detailed and final reports for all details, in brief -   DG Chest 2 View  Result Date: 07/12/2021 CLINICAL DATA:  Lung cancer EXAM: CHEST - 2 VIEW COMPARISON:  Radiograph March 11, 2021 and chest CT June 27, 2021. FINDINGS: The heart size and mediastinal contours are within normal limits. Postsurgical change right upper lobectomy with right hilar chain staples and elevation right hilum, similar to prior chest CT. Right basilar pleural thickening appears similar to prior chest CT as well. No new focal airspace consolidation. Chronic parenchymal lung changes. Spinal stimulator leads. IMPRESSION: Postsurgical change right upper lobectomy. Right basilar pleural thickening appears similar to prior chest CT. No new focal airspace consolidation. Electronically Signed   By: Dahlia Bailiff M.D.   On: 07/12/2021  14:02   DG Chest Portable 1 View  Result Date: 08/03/2021 CLINICAL DATA:  Chest pain.  COPD EXAM: PORTABLE CHEST 1 VIEW COMPARISON:  07/12/2021 FINDINGS: COPD with hyperinflation and pulmonary scarring. Postsurgical changes and scarring in the right upper lobe. Blunting right costophrenic angle unchanged compatible with scarring from surgery. Spinal cord stimulator midthoracic spine unchanged No acute infiltrate.  No heart failure or edema. IMPRESSION: COPD and scarring with postoperative changes on the right. No acute cardiopulmonary process. Electronically Signed   By: Franchot Gallo M.D.   On: 08/03/2021 15:21   DG ABD ACUTE 2+V W 1V CHEST  Result Date: 08/06/2021 CLINICAL DATA:  Dyspnea and respiratory abnormalities. EXAM: DG ABDOMEN ACUTE WITH 1 VIEW CHEST COMPARISON:  CTA chest, abdomen and pelvis 08/03/2021 and chest radiograph 07/12/2021 FINDINGS: Again noted are emphysematous changes in the lungs with postoperative changes in the right lung. Patchy densities in the right upper lung near the apex are similar to the recent chest CT but new since 07/12/2021. Heart and mediastinum are within normal  limits and stable. Spinal cord stimulator in the thoracic spine. Negative for free air. Again noted is bowel gas along the left side of the abdomen and pelvis and similar to the recent CT. Nonobstructive bowel gas pattern. Posterior interbody fusion in the lower lumbar spine. IMPRESSION: 1. Postoperative changes in the right lung with persistent patchy densities near the right lung apex. This represents a change since 07/12/2021 but similar to the exam from 08/03/2021. Suspect these densities are infectious or inflammatory in etiology. 2. Nonobstructive bowel gas pattern. 3. Postsurgical changes as described. Electronically Signed   By: Markus Daft M.D.   On: 08/06/2021 10:04   [place] LT LOWER EXTREM LTD SOFT TISSUE NON VASCULAR  Result Date: 07/22/2021 CLINICAL DATA:  Lateral foot and ankle pain and swelling  for 1 month EXAM: ULTRASOUND LEFT LOWER EXTREMITY LIMITED TECHNIQUE: Ultrasound examination of the lower extremity soft tissues was performed in the area of clinical concern. COMPARISON:  Left foot x-ray 09/15/2020 FINDINGS: Targeted ultrasound was performed of the soft tissues of the left lower extremity at site of patient's clinical concern. Images obtained of the lateral aspect of the left foot and left ankle. There is edema and ill-defined fluid within the subcutaneous soft tissues, most pronounced overlying the left ankle laterally. No organized fluid collection. Underlying peroneal tendons at this location appear intact. No significant tenosynovial fluid collection. IMPRESSION: Nonspecific soft tissue swelling and edema over the lateral left foot and ankle. No associated soft tissue or tenosynovial fluid collection. Electronically Signed   By: Davina Poke D.O.   On: 07/22/2021 19:58   CT Angio Chest/Abd/Pel for Dissection W and/or Wo Contrast  Result Date: 08/03/2021 CLINICAL DATA:  Acute aortic syndrome (AAS) suspected EXAM: CT ANGIOGRAPHY CHEST, ABDOMEN AND PELVIS TECHNIQUE: Non-contrast CT of the chest was initially obtained. Multidetector CT imaging through the chest, abdomen and pelvis was performed using the standard protocol during bolus administration of intravenous contrast. Multiplanar reconstructed images and MIPs were obtained and reviewed to evaluate the vascular anatomy. RADIATION DOSE REDUCTION: This exam was performed according to the departmental dose-optimization program which includes automated exposure control, adjustment of the mA and/or kV according to patient size and/or use of iterative reconstruction technique. CONTRAST:  58mL OMNIPAQUE IOHEXOL 350 MG/ML SOLN COMPARISON:  CT abdomen pelvis 03/11/2021, ultrasound abdomen 06/29/2021, CT chest 06/27/2021 FINDINGS: CTA CHEST FINDINGS Cardiovascular: Normal heart size. No significant pericardial effusion. The thoracic aorta is  normal in caliber. At least moderate atherosclerotic plaque of the thoracic aorta. At least 2 vessel coronary artery calcifications. The main pulmonary artery is normal in caliber. Enlarged right main pulmonary artery. No central or segmental pulmonary embolus. Mediastinum/Nodes: No enlarged mediastinal, hilar, or axillary lymph nodes. Subcentimeter right thyroid gland hypodensity. Not clinically significant; no follow-up imaging recommended (ref: J Am Coll Radiol. 2015 Feb;12(2): 143-50). Trachea and esophagus demonstrate no significant findings. Lungs/Pleura: Severe centrilobular emphysematous changes. Surgical changes related to right upper lobectomy. Chronic expansion of the right lower lobe. Interval development of a 1.2 x 1 2.6 cm nodular like consolidation within the right lower lobe (7:40). Interval increase in partial collapse of the right middle lobe with patchy airspace opacity. No pulmonary nodule. No pulmonary mass. No pleural effusion. No pneumothorax. Musculoskeletal: No chest wall abnormality. No suspicious lytic or blastic osseous lesions. No acute displaced fracture. Multilevel degenerative changes of the spine. Neural stimulator leads terminate in the posterior central canal at the T6 through T7 levels. Review of the MIP images confirms the above findings. CTA ABDOMEN  AND PELVIS FINDINGS VASCULAR Aorta: Severe atherosclerotic plaque. Normal caliber aorta without aneurysm, dissection, vasculitis or significant stenosis. Celiac: Patent without evidence of aneurysm, dissection, vasculitis or significant stenosis. SMA: Patent without evidence of aneurysm, dissection, vasculitis or significant stenosis. Renals: Both renal arteries are patent without evidence of aneurysm, dissection, vasculitis, fibromuscular dysplasia or significant stenosis. IMA: Patent without evidence of aneurysm, dissection, vasculitis or significant stenosis. Inflow: Atherosclerotic plaque. Patent without evidence of aneurysm,  dissection, vasculitis or significant stenosis. Veins: No obvious venous abnormality within the limitations of this arterial phase study. Review of the MIP images confirms the above findings. NON-VASCULAR Hepatobiliary: No focal liver abnormality. No CT evidence of gallstones. Persistent gallbladder wall thickening. No definite pericholecystic fluid. Enlarged common bile duct measuring up to 1.7 cm. Pancreas: No focal lesion. Normal pancreatic contour. No surrounding inflammatory changes. No main pancreatic ductal dilatation. Spleen: Normal in size without focal abnormality. Adrenals/Urinary Tract: No adrenal nodule bilaterally. Bilateral kidneys enhance symmetrically. Subcentimeter hypodensities bilaterally are too small to characterize. Pericentimeter fluid density lesions within bilateral kidneys statistically likely represent simple renal cysts. Simple renal cysts, in the absence of clinically indicated signs/symptoms, require no independent follow-up. There is a 1.1 cm hypodense lesion within the left kidney with a density of 29 Hounsfield units (5:108). No hydronephrosis. No hydroureter. The urinary bladder is unremarkable. Stomach/Bowel: Stomach is within normal limits. No evidence of bowel wall thickening or dilatation. The appendix is not definitely identified with no inflammatory changes in the right lower quadrant to suggest acute appendicitis. Lymphatic: Slightly limited evaluation of the to peritoneum due to streak artifact originating from the lumbar surgical hardware. No lymphadenopathy. Reproductive: Status post hysterectomy. No adnexal masses. Other: No intraperitoneal free fluid. No intraperitoneal free gas. No organized fluid collection. Musculoskeletal: No abdominal wall hernia or abnormality. Diffusely decreased bone density. No suspicious lytic or blastic osseous lesions. No acute displaced fracture. Status post L3-L5 posterolateral and interbody fusion. Multilevel degenerative changes of the  spine. Review of the MIP images confirms the above findings. IMPRESSION: 1. No aorta aneurysm or dissection. Aortic aneurysm NOS (ICD10-I71.9). 2. Interval development of a 1.2 x 1 2.6 cm nodular-like consolidation within the right lower lobe. Finding likely represents infection. Underlying malignancy not fully excluded but less likely given appearance of the right upper lobe 1 month ago. Additional imaging evaluation or consultation with Pulmonology or Thoracic Surgery recommended. 3. Interval increase in partial collapse and patchy airspace opacity of the right middle lobe. Finding may represent a combination of atelectasis versus infection/inflammation. 4. Status post right upper lobectomy. 5. Interval development of a 1.2 x 1 2.6 cm nodular like consolidation within the right lower lobe. 6. Enlarged common bile duct with associated persistent nonspecific gallbladder wall thickening. Gallbladder wall thickening can be seen in the setting of chronic liver disease versus acute cholecystitis. Correlate with liver function tests. 7. Indeterminate 1.1 cm left renal lesion. Finding may represent a minimally complex renal cyst versus solid renal lesion. Consider nonemergent further evaluation with MRI or CT renal protocol. 8. Aortic Atherosclerosis (ICD10-I70.0). Electronically Signed   By: Iven Finn M.D.   On: 08/03/2021 19:24   ECHOCARDIOGRAM LIMITED  Result Date: 08/04/2021    ECHOCARDIOGRAM LIMITED REPORT   Patient Name:   KIYANI JERNIGAN Rautio Date of Exam: 08/04/2021 Medical Rec #:  [id number]       Height:       63.0 in Accession #:    [id number]      Weight:  80.0 lb Date of Birth:  01/27/1956       BSA:          1.310 m Patient Age:    47 years        BP:           129/78 mmHg Patient Gender: F               HR:           80 bpm. Exam Location:  [facility] Procedure: Limited Echo Indications:    Chest Pain  History:        Patient has prior history of Echocardiogram examinations, most                  recent 11/26/2020. CAD, COPD, Signs/Symptoms:Chest Pain; Risk                 Factors:Hypertension and Current Smoker. Lung CA, S/P Lobectomy.  Sonographer:    Wenda Low Referring Phys: [id number] [place]  1. Left ventricular ejection fraction, by estimation, is 60 to 65%. The left ventricle has normal function. The left ventricle has no regional wall motion abnormalities.  2. Right ventricular systolic function is normal. The right ventricular size is normal.  3. The inferior vena cava is normal in size with greater than 50% respiratory variability, suggesting right atrial pressure of 3 mmHg.  4. Limited echo to evaluate LV function and wall motion FINDINGS  Left Ventricle: Left ventricular ejection fraction, by estimation, is 60 to 65%. The left ventricle has normal function. The left ventricle has no regional wall motion abnormalities. The left ventricular internal cavity size was normal in size. There is  no left ventricular hypertrophy. Right Ventricle: The right ventricular size is normal. Right vetricular wall thickness was not well visualized. Right ventricular systolic function is normal. Pericardium: There is no evidence of pericardial effusion. Aorta: The aortic root is normal in size and structure. Venous: The inferior vena cava is normal in size with greater than 50% respiratory variability, suggesting right atrial pressure of 3 mmHg. LEFT VENTRICLE PLAX 2D LVIDd:         4.20 cm LVIDs:         2.50 cm LV PW:         0.70 cm LV IVS:        0.90 cm LVOT diam:     1.80 cm LVOT Area:     2.54 cm  LV Volumes (MOD) LV vol d, MOD A2C: 52.1 ml LV vol d, MOD A4C: 40.7 ml LV vol s, MOD A2C: 21.1 ml LV vol s, MOD A4C: 17.2 ml LV SV MOD A2C:     31.0 ml LV SV MOD A4C:     40.7 ml LV SV MOD BP:      27.3 ml LEFT ATRIUM         Index LA diam:    3.00 cm 2.29 cm/m   AORTA Ao Root diam: 2.80 cm  SHUNTS Systemic Diam: 1.80 cm Carlyle Dolly MD Electronically signed by Carlyle Dolly MD  Signature Date/Time: 08/04/2021/12:15:31 PM    Final     Micro Results   Recent Results (from the past 240 hour(s))  Resp Panel by RT-PCR (Flu A&B, Covid) Anterior Nasal Swab     Status: None   Collection Time: 08/03/21  3:00 PM   Specimen: Anterior Nasal Swab  Result Value Ref Range Status   SARS Coronavirus 2 by RT PCR NEGATIVE NEGATIVE  Final    Comment: (NOTE) SARS-CoV-2 target nucleic acids are NOT DETECTED.  The SARS-CoV-2 RNA is generally detectable in upper respiratory specimens during the acute phase of infection. The lowest concentration of SARS-CoV-2 viral copies this assay can detect is 138 copies/mL. A negative result does not preclude SARS-Cov-2 infection and should not be used as the sole basis for treatment or other patient management decisions. A negative result may occur with  improper specimen collection/handling, submission of specimen other than nasopharyngeal swab, presence of viral mutation(s) within the areas targeted by this assay, and inadequate number of viral copies(<138 copies/mL). A negative result must be combined with clinical observations, patient history, and epidemiological information. The expected result is Negative.  Fact Sheet for Patients:  EntrepreneurPulse.com.au  Fact Sheet for Healthcare Providers:  IncredibleEmployment.be  This test is no t yet approved or cleared by the [place] FDA and  has been authorized for detection and/or diagnosis of SARS-CoV-2 by FDA under an Emergency Use Authorization (EUA). This EUA will remain  in effect (meaning this test can be used) for the duration of the COVID-19 declaration under Section 564(b)(1) of the Act, 21 U.S.C.section 360bbb-3(b)(1), unless the authorization is terminated  or revoked sooner.       Influenza A by PCR NEGATIVE NEGATIVE Final   Influenza B by PCR NEGATIVE NEGATIVE Final    Comment: (NOTE) The Xpert Xpress SARS-CoV-2/FLU/RSV plus assay  is intended as an aid in the diagnosis of influenza from Nasopharyngeal swab specimens and should not be used as a sole basis for treatment. Nasal washings and aspirates are unacceptable for Xpert Xpress SARS-CoV-2/FLU/RSV testing.  Fact Sheet for Patients: EntrepreneurPulse.com.au  Fact Sheet for Healthcare Providers: IncredibleEmployment.be  This test is not yet approved or cleared by the [place] FDA and has been authorized for detection and/or diagnosis of SARS-CoV-2 by FDA under an Emergency Use Authorization (EUA). This EUA will remain in effect (meaning this test can be used) for the duration of the COVID-19 declaration under Section 564(b)(1) of the Act, 21 U.S.C. section 360bbb-3(b)(1), unless the authorization is terminated or revoked.  Performed at [facility], [address] [place], [place] [address]   MRSA Next Gen by PCR, Nasal     Status: None   Collection Time: 08/04/21 10:28 AM   Specimen: Nasal Mucosa; Nasal Swab  Result Value Ref Range Status   MRSA by PCR Next Gen NOT DETECTED NOT DETECTED Final    Comment: (NOTE) The GeneXpert MRSA Assay (FDA approved for NASAL specimens only), is one component of a comprehensive MRSA colonization surveillance program. It is not intended to diagnose MRSA infection nor to guide or monitor treatment for MRSA infections. Test performance is not FDA approved in patients less than 48 years old. Performed at [facility], [address] [place], [place] [address]    Today   Subjective    Charlean Carneal today has no new complaints -Cough and shortness of breath improved -No further diarrhea, no further emesis No fever  Or chills    Patient has been seen and examined prior to discharge   Objective   Blood pressure 134/83, pulse (!) 101, temperature 99 F (37.2 C), temperature source Oral, resp. rate 19, height 5\' 3"  (1.6 m), weight 36.3 kg, SpO2 100 %.   Intake/Output Summary  (Last 24 hours) at 08/07/2021 1150 Last data filed at 08/07/2021 0900 Gross per 24 hour  Intake 480 ml  Output 750 ml  Net -270 ml   Exam Gen:-  Awake Alert, no acute distress , speaking in complete sentences HEENT:- [place].AT, No sclera icterus Nose- [place] 2L/min Neck-Supple Neck,No JVD,.  Lungs-improved air movement, no wheezing or rales  CV- S1, S2 normal, regular Abd-  +ve B.Sounds, Abd Soft, No tenderness,    Extremity/Skin:- No  edema,   good pulses Psych-affect is appropriate, oriented x3 Neuro-no new focal deficits, no tremors    Data Review   CBC w Diff:  Lab Results  Component Value Date   WBC 4.9 08/05/2021   HGB 10.0 (L) 08/05/2021   HGB 9.5 (L) 07/21/2021   HCT 32.7 (L) 08/05/2021   HCT 28.2 (L) 07/21/2021   PLT 248 08/05/2021   PLT 329 07/21/2021   LYMPHOPCT 9 08/03/2021   MONOPCT 6 08/03/2021   EOSPCT 0 08/03/2021   BASOPCT 1 08/03/2021   CMP:  Lab Results  Component Value Date   NA 138 08/05/2021   NA 139 07/21/2021   K 3.9 08/05/2021   CL 104 08/05/2021   CO2 28 08/05/2021   BUN 8 08/05/2021   BUN 12 07/21/2021   CREATININE 0.31 (L) 08/05/2021   PROT 5.3 (L) 08/05/2021   PROT 5.7 (L) 07/21/2021   ALBUMIN 2.4 (L) 08/05/2021   ALBUMIN 3.0 (L) 07/21/2021   BILITOT 0.5 08/05/2021   BILITOT <0.2 07/21/2021   ALKPHOS 76 08/05/2021   AST 13 (L) 08/05/2021   ALT 10 08/05/2021   Total Discharge time is about 33 minutes  Roxan Hockey M.D on 08/07/2021 at 11:50 AM  Go to [email or web address] -  for contact info  [facility] - Office  [phone]

## 2021-08-07 NOTE — Progress Notes (Signed)
Patient verbalized understanding of all discharge instructions Discharged home with spouse

## 2021-08-08 ENCOUNTER — Ambulatory Visit: Payer: Medicare Other | Admitting: Family Medicine

## 2021-08-08 ENCOUNTER — Telehealth: Payer: Self-pay

## 2021-08-08 LAB — LEGIONELLA PNEUMOPHILA SEROGP 1 UR AG: L. pneumophila Serogp 1 Ur Ag: NEGATIVE

## 2021-08-08 NOTE — Telephone Encounter (Signed)
Transition Care Management Unsuccessful Follow-up Telephone Call  Date of discharge and from where:  [facility] 08/07/21 - pneumonia  Attempts:  1st Attempt  Reason for unsuccessful TCM follow-up call:  Left voice message  Transition Care Management Unsuccessful Follow-up Telephone Call  Date of discharge and from where:  [facility] 08/07/21 - pneumonia  Attempts:  2nd Attempt  Reason for unsuccessful TCM follow-up call:  Unable to leave message Voice mail not set up yet

## 2021-08-09 NOTE — Progress Notes (Signed)
GI Office Note    Referring Provider: Janora Norlander, DO Primary Care Physician:  Jasmine Norlander, DO Primary GI: Dr. Gala [name]  Date:  08/16/2021  ID:  Jasmine [name], DOB 10-Apr-1955, MRN [id number]   Chief Complaint   Chief Complaint  Patient presents with   Follow-up    Still having nausea and vomiting     History of Present Illness  Jasmine [name] is a 66 y.o. 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 She is presenting today for hospital follow up.    Last EGD and Colonoscopy 06/14/20.  EGD with normal esophagus, erosive gastropathy predominantly antrum (hyperemia and mild reactive changes), marked erythema, patent pylorus, normal duodenum.  Colonoscopy with four 69 mm polyps at splenic flexure and hepatic flexure, single 20 mm polyp in the hepatic flexure, otherwise normal, pathology consistent with tubular adenomas. Due for repeat colonoscopy 2025.   Admitted to [facility] 06/28/21 with recurrent nausea and vomiting and diarrhea. She had RUQ U/S showing gallbladder wall thickening, no gallstones, CBD measuring 13 mm.  It was suspected that she presented with pancreatitis of unclear etiology, denies history of heavy alcohol use.  Most recent imaging in January 2023 with calcifications in the pancreatic head and uncinate process, 1 calcification in the region of the ampulla.  She had prior imaging which also showed pancreatic ductal dilation up to 8 mm in the pancreatic head.  She was suggested to have EUS back in 2020 and 2022 for what she did not have completed due to various other medical issues.  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  She had had  significant weight loss in the past.  Had denied any diarrhea.  She was unable to have MRI due to spinal cord stimulator in place.  LFTs were normal, alk phos mildly elevated on admission but improved to WNL.  Suspect that her nausea and vomiting was related to acute on chronic pancreatitis, gastroparesis, or other biliary etiology in the setting of chronic opioid use has had prior EGD in April 2022 with normal esophagus, erosive gastropathy predominantly in the antrum, normal duodenum.  She had multiple hospital admissions for upper abdominal pain and intractable nausea vomiting.  She had reported a lack of appetite for quite some time, taking boost shakes at home.  She was started on Creon 48,000 units with meals and 24,000 units with snacks and had improvement in symptoms within 48 hours.   Received note from her PCP, most recent lab work completed from 5/18 with Hgb 9.5, down from 10.6 at discharge.  Macrocytic indices, normal platelets, low protein and albumin, normal LFTs.   Labs completed during hospital admission 6/2 CBC with hemoglobin 10 -11.3, B12 465, folate 11.2, iron 50, saturation 27%, ferritin 280.   Today:  Chronic pancreatitis - No diarrhea. No abdominal pain. Feels like the past 18 months have been worse than ever before.  Taking Creon as prescribed.  Has gained 7 pounds recently. Was as low as 76. Using protein/boost 3 times a day.   Has had an increased appetite, no N/V. No abdominal pain. The last few times prior to going to the hospital she would get nauseous and would vomit for days at a time and then has the  upper chest pain and back pain from the vomiting. Always gets fluids and pain medication. Constantly drinking water at home unless she starts vomiting. Has compazine suppositories now. Not using zofran or phenergan suppositories. Interested in doing the EUS as soon as possible.   Just released from Dr. Roxan [name] in late May. Following with Dr. Delton [name] for anemia and her  cancer.   On 4L [place] for about a year, sob and chest pain are at baseline. Not doing PT/OT. Has cut back on smoking 1/2 pack per day. See pulmonologist next week.   Denies paresthesia, pica, or increased weakness/fatigue.  Also denies any melena or BRBPR.  Past Medical History:  Diagnosis Date   Allergy    Anemia    Anxiety    Arthritis    Asthma    Carpal tunnel syndrome    Chronic back pain    Cigarette smoker 2/35/3614   Complication of anesthesia    in the past has had N/V, the last few surgeries she has not been   COPD (chronic obstructive pulmonary disease) ([place])    Easy bruising 07/10/2016   GERD (gastroesophageal reflux disease)    Headache    Heart murmur 2005   never had any problems   History of blood transfusion 2018   History of kidney stones    Hypertension    Hypoglycemia    Hypoglycemia    Iron deficiency anemia 07/11/2016   Neuropathy    both arms   Normocytic anemia 07/29/2015   Pancreatitis    Pneumonia    PONV (postoperative nausea and vomiting)    Postoperative anemia due to acute blood loss 11/15/2015   R lung cancer    Sciatica    Seizures ([place])    as a child when she would have an asthma attack. none as an adult   Shortness of breath dyspnea     Past Surgical History:  Procedure Laterality Date   ABDOMINAL HYSTERECTOMY  4315   APPLICATION OF ROBOTIC ASSISTANCE FOR SPINAL PROCEDURE N/A 09/05/2016   Procedure: APPLICATION OF ROBOTIC ASSISTANCE FOR SPINAL PROCEDURE;  Surgeon: Jasmine Lose, MD;  Location: [place];  Service: Neurosurgery;  Laterality: N/A;   BACK SURGERY     BIOPSY  06/14/2020   Procedure: BIOPSY;  Surgeon: Jasmine Dolin, MD;  [facility];  Service: Endoscopy;;   [place]   COLONOSCOPY     unsure last   COLONOSCOPY WITH PROPOFOL N/A 06/14/2020   Procedure: COLONOSCOPY WITH PROPOFOL;  Surgeon: Jasmine Dolin, MD;  [facility];  Service: Endoscopy;  Laterality: N/A;  PM    cyst removal face     disk repair     neck and lumbar region   ESOPHAGOGASTRODUODENOSCOPY (EGD) WITH PROPOFOL N/A 06/14/2020   Procedure: ESOPHAGOGASTRODUODENOSCOPY (EGD) WITH PROPOFOL;  Surgeon: Jasmine Dolin, MD;  [facility];  Service: Endoscopy;  Laterality: N/A;   FOOT SURGERY Bilateral    to file bones down    HARDWARE REMOVAL N/A 09/05/2016   Procedure: HARDWARE REMOVAL AND REPLACEMENT OF LUMBAR FIVE SCREWS;  Surgeon: Jasmine Lose, MD;  Location: [place];  Service: Neurosurgery;  Laterality: N/A;   IMPLANTATION / PLACEMENT EPIDURAL NEUROSTIMULATOR ELECTRODES     INTERCOSTAL NERVE BLOCK Right 07/29/2020   Procedure: INTERCOSTAL NERVE BLOCK;  Surgeon: Melrose Nakayama, MD;  Location: [place];  Service: Thoracic;  Laterality: Right;   LAMINECTOMY     2006  lobectomy Right 07/2020   LUMBAR FUSION     LYMPH NODE BIOPSY Right 07/29/2020   Procedure: LYMPH NODE BIOPSY;  Surgeon: Melrose Nakayama, MD;  Location: [place];  Service: Thoracic;  Laterality: Right;   POLYPECTOMY  06/14/2020   Procedure: POLYPECTOMY INTESTINAL;  Surgeon: Jasmine Dolin, MD;  [facility];  Service: Endoscopy;;   SPINAL CORD STIMULATOR BATTERY EXCHANGE     SPINAL CORD STIMULATOR INSERTION N/A 07/05/2016   Procedure: REPLACEMENT OF LUMBAR SPINAL CORD STIMULATOR BATTERY;  Surgeon: Newman Pies, MD;  Location: [place];  Service: Neurosurgery;  Laterality: N/A;   TONSILLECTOMY      Current Outpatient Medications  Medication Sig Dispense Refill   albuterol (VENTOLIN HFA) 108 (90 Base) MCG/ACT inhaler Inhale 2 puffs into the lungs every 6 (six) hours as needed for wheezing or shortness of breath. 18 g 3   amLODipine (NORVASC) 10 MG tablet Take 1 tablet (10 mg total) by mouth daily. 30 tablet 5   atorvastatin (LIPITOR) 40 MG tablet Take 1 tablet (40 mg total) by mouth daily. 90 tablet 3   bisoprolol (ZEBETA) 5 MG tablet Take 0.5 tablets (2.5 mg total) by mouth daily. Take in  Place of Metoprolol 45 tablet 2   Cholecalciferol 125 MCG (5000 UT) capsule Take 5,000 Units by mouth daily.     dextromethorphan (DELSYM) 30 MG/5ML liquid Take 15 mg by mouth as needed for cough.     dextromethorphan-guaiFENesin (MUCINEX DM) 30-600 MG 12hr tablet Take 1 tablet by mouth 2 (two) times daily.     fluticasone-salmeterol (ADVAIR DISKUS) 250-50 MCG/ACT AEPB Inhale 1 puff into the lungs in the morning and at bedtime. 180 each 3   lipase/protease/amylase 24000-76000 units CPEP Take 2 caps daily with meals and 1 cap with snacks 270 capsule 2   mirtazapine (REMERON) 7.5 MG tablet Take 1 tablet (7.5 mg total) by mouth at bedtime. Cross taper with 34m of Cymbalta for 3-4 weeks. Then stop cymbalta 30 tablet 1   nicotine (NICODERM CQ - DOSED IN MG/24 HOURS) 21 mg/24hr patch Place 1 patch (21 mg total) onto the skin daily for 28 days. 28 patch 0   Nutritional Supplements (ENSURE HIGH PROTEIN) LIQD Drink 1 can 3 times daily. 21330 mL PRN   omeprazole (PRILOSEC) 40 MG capsule Take one capsule once to twice daily before a meal. 90 capsule 3   oxybutynin (DITROPAN-XL) 5 MG 24 hr tablet TAKE 1 TABLET(5 MG) BY MOUTH AT BEDTIME (Patient taking differently: Take 5 mg by mouth at bedtime.) 90 tablet 0   oxyCODONE-acetaminophen (PERCOCET) 10-325 MG tablet Take 1 tablet by mouth 5 (five) times daily.     OXYCONTIN 15 MG 12 hr tablet Take 15 mg by mouth every 12 (twelve) hours.     OXYGEN Inhale 4 L into the lungs See admin instructions. continuous     polyethylene glycol (MIRALAX) 17 g packet Take 17 g by mouth daily. 30 packet 3   prochlorperazine (COMPAZINE) 25 MG suppository Place 1 suppository (25 mg total) rectally every 12 (twelve) hours as needed for nausea or vomiting. 12 suppository 3   Tiotropium Bromide Monohydrate (SPIRIVA RESPIMAT) 2.5 MCG/ACT AERS Inhale 2 puffs into the lungs daily. 4 g 3   tizanidine (ZANAFLEX) 4 MG capsule Take 1 capsule (4 mg total) by mouth 3 (three) times daily as  needed for muscle spasms. 90 capsule 3   traZODone (DESYREL) 150 MG tablet TAKE 1 TO 2 TABLETS AT BEDTIME (Patient taking differently:  Take 150 mg by mouth at bedtime.) 180 tablet 3   No current facility-administered medications for this visit.    Allergies as of 08/16/2021 - Review Complete 08/16/2021  Allergen Reaction Noted   Lyrica [pregabalin] Other (See Comments) 06/29/2016   Darvon [propoxyphene]  02/22/2015   Gabapentin  08/31/2016   Augmentin [amoxicillin-pot clavulanate] Nausea And Vomiting 03/19/2015   Erythromycin Itching and Rash 02/22/2015   Vibramycin [doxycycline calcium] Itching and Rash 02/22/2015    Family History  Problem Relation Age of Onset   Heart disease Mother    Diabetes Mother    Hypertension Mother    Hyperlipidemia Mother    COPD Mother        smoked   Cancer Mother        bile duct   COPD Father    Diabetes Father    Heart disease Father    Cancer Father        throat   Dementia Father    Asthma Son    Hypertension Sister    Hyperlipidemia Sister    Hypertension Brother    Hyperlipidemia Brother    Hypertension Brother    Hyperlipidemia Brother    Emphysema Maternal Grandmother        smoked   Colon cancer Neg Hx    Colon polyps Neg Hx     Social History   Socioeconomic History   Marital status: Married    Spouse name: [name]   Number of children: 1   Years of education: Not on file   Highest education level: Some college, no degree  Occupational History   Occupation: disability  Tobacco Use   Smoking status: Every Day    Packs/day: 0.25    Years: 35.00    Total pack years: 8.75    Types: Cigarettes   Smokeless tobacco: Never   Tobacco comments:    trying to quit  Vaping Use   Vaping Use: Never used  Substance and Sexual Activity   Alcohol use: Yes    Alcohol/week: 2.0 standard drinks of alcohol    Types: 2 Glasses of wine per week    Comment: occasionally   Drug use: No   Sexual activity: Yes    Birth  control/protection: None  Other Topics Concern   Not on file  Social History Narrative   Retired, 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 1 son, deceased. 2 pet dogs. Enjoys watching TV.   Husband takes care of cooking, cleaning, driving, and helps her with ADLs including bathing   Social Determinants of Health   Financial Resource Strain: Low Risk  (04/01/2021)   Overall Financial Resource Strain (CARDIA)    Difficulty of Paying Living Expenses: Not very hard  Food Insecurity: No Food Insecurity (04/01/2021)   Hunger Vital Sign    Worried About Running Out of Food in the Last Year: Never true    Ran Out of Food in the Last Year: Never true  Transportation Needs: No Transportation Needs (04/01/2021)   PRAPARE - Hydrologist (Medical): No    Lack of Transportation (Non-Medical): No  Physical Activity: Insufficiently Active (04/01/2021)   Exercise Vital Sign    Days of Exercise per Week: 7 days    Minutes of Exercise per Session: 10 min  Stress: No Stress Concern Present (04/01/2021)   [place]    Feeling of Stress : Not at all  Social Connections:  Socially Integrated (04/01/2021)   Social Connection and Isolation Panel [NHANES]    Frequency of Communication with Friends and Family: More than three times a week    Frequency of Social Gatherings with Friends and Family: More than three times a week    Attends Religious Services: 1 to 4 times per year    Active Member of [employer] or Organizations: Yes    Attends Archivist Meetings: 1 to 4 times per year    Marital Status: Married     Review of Systems   Gen: Denies fever, chills, anorexia. Denies fatigue, weakness, weight loss.  CV: Denies chest pain, palpitations, syncope, peripheral edema, and claudication. Resp: Denies dyspnea at rest, cough, wheezing, coughing up blood, and pleurisy. GI: Denies vomiting blood,  jaundice, and fecal incontinence.   Denies dysphagia or odynophagia. Derm: Denies rash, itching, dry skin Psych: Denies depression, anxiety, memory loss, confusion. No homicidal or suicidal ideation.  Heme: Denies bruising, bleeding, and enlarged lymph nodes.   Physical Exam   BP 130/68   Pulse 98   Temp 97.6 F (36.4 C) (Temporal)   Ht 5' 3" (1.6 m)   Wt 93 lb 9.6 oz (42.5 kg)   BMI 16.58 kg/m   Exam limited as patient is not able to get on the exam table.   General:   Alert and oriented. No distress noted. Pleasant and cooperative.  Head:  Normocephalic and atraumatic. Eyes:  Conjuctiva clear without scleral icterus. Mouth: Mask in place Lungs: Mostly clear to auscultation bilaterally, wheezing noted throughout bilateral lung fields.  Shallow breaths.  On 4 L nasal cannula.  Symmetrical chest rise Heart:  S1, S2 present without murmurs appreciated.  Abdomen:  +BS, soft, non-tender and non-distended. No rebound or guarding. No HSM or masses noted. Rectal: deferred Msk:  Symmetrical without gross deformities. Normal posture. Extremities:  Without edema. Neurologic:  Alert and  oriented x4 Psych:  Alert and cooperative. Normal mood and affect.   Assessment  Jasmine [name] is a 66 y.o. female  with a history of COPD, chronic pancreatitis, chronically dilated CBD, h/o C. 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   Chronic pancreatitis/Dilated CBD and pancreatic duct: CT A/P with contrast in January 2023 with stable calcifications in the pancreatic head and in the region of the ampullary with associated biliary and pancreatic ductal dilation.  RUQ [place] April 2023 showing dilated CBD 13 mm.  It was previously recommended for her to have an MRCP related to her prior CT findings, however she has a spinal cord stimulator in place.  Imaging prior to these also noted pancreatic  ductal dilation measuring up to 8 mm.  She pancreatic elastase drawn in September 2022 that was less than 50 implying periodic exocrine insufficiency.  She had continued weight loss and was at one point down to 75 pounds.  She has had many admissions for intractable nausea and vomiting likely multifactorial given chronic pancreatitis, possible gastroparesis, or other biliary etiology given chronic opioid use.  During hospital admission in April 2023 she was started on pancreatic enzyme replacement, currently taking 48,000 units with meals and 24,000 units with snacks.  She has had 1 hospital admission since April 2023 for the same thing.  Since she was discharged from this hospitalization she states that she has a great appetite, denies any diarrhea or abdominal pain, and her nausea has been controlled with  Compazine suppositories.  She states she has been occasionally constipated taking MiraLAX to help with this.  Patient was previously recommended to have EUS in 2020 and 2022, however patient never had this completed due to other medical issues.  She is now status post lung resection for right upper lobe lung cancer and is following with oncology and pulmonology.  She states that she is tired of having this frequent nausea and vomiting and is ready to have EUS to further evaluate her dilated common bile duct and pancreatic ducts. She is to continue her current dose of Creon, following low-fat diet, and supplementing her diet with boost/Ensure 3 times daily.  She has gained 7 pounds recently now that she is tolerating more p.o. intake. Referral to  GI placed today for EUS.   Anemia: She states that her energy level is as good as can be expected.  She denies any increase shortness of breath, chest pain, paresthesia, weakness above baseline.  She is chronically weak, using a walker and is on 4 L of oxygen at home at baseline.  She is following Dr. Delton [name] regarding her cancer and her anemia.  Her most  recent lab work performed 6/2 with stable hemoglobin in the 10-11.3 range with normal ferritin, normal iron and iron saturation.   PLAN    Referral to [facility] for EUS to further evaluate dilated CBD and dilated pancreatic duct.  Continue Creon 48,000 units with meals and 24,000 units with snacks Continue omeprazole 40 mg daily  Continue Compazine suppositories as needed, reach out if refill needed. Continue low fat diet. Continue nutritional supplements with 3/day. Advised tobacco cessation Tentatively will follow up in 6 months.    Venetia Night, MSN, FNP-BC, AGACNP-BC [facility]

## 2021-08-12 NOTE — Telephone Encounter (Signed)
Transition Care Management Unsuccessful Follow-up Telephone Call  Date of discharge and from where:  Jasmine [name] 08/07/21 - pneumonia  Attempts:  3rd Attempt  Reason for unsuccessful TCM follow-up call:  Unable to reach patient  She does have a hosp f/u appt made already for 08/18/21 @ 3:30

## 2021-08-15 ENCOUNTER — Other Ambulatory Visit: Payer: Self-pay

## 2021-08-15 DIAGNOSIS — J441 Chronic obstructive pulmonary disease with (acute) exacerbation: Secondary | ICD-10-CM

## 2021-08-16 ENCOUNTER — Other Ambulatory Visit: Payer: Self-pay | Admitting: *Deleted

## 2021-08-16 ENCOUNTER — Encounter: Payer: Self-pay | Admitting: Gastroenterology

## 2021-08-16 ENCOUNTER — Ambulatory Visit (INDEPENDENT_AMBULATORY_CARE_PROVIDER_SITE_OTHER): Payer: Medicare Other | Admitting: Gastroenterology

## 2021-08-16 VITALS — BP 130/68 | HR 98 | Temp 97.6°F | Ht 63.0 in | Wt 93.6 lb

## 2021-08-16 DIAGNOSIS — K861 Other chronic pancreatitis: Secondary | ICD-10-CM | POA: Diagnosis not present

## 2021-08-16 DIAGNOSIS — K838 Other specified diseases of biliary tract: Secondary | ICD-10-CM

## 2021-08-16 DIAGNOSIS — D649 Anemia, unspecified: Secondary | ICD-10-CM | POA: Diagnosis not present

## 2021-08-16 DIAGNOSIS — K8689 Other specified diseases of pancreas: Secondary | ICD-10-CM

## 2021-08-16 NOTE — Patient Instructions (Addendum)
Continue Creon 48,000 units with meals and 24,000 units with snacks Also continue omeprazole 40 mg daily and her Compazine suppositories.  Please reach out for refill if needed.  Continue with your small meals throughout the day and supplementing with your boost/ensures.  Congratulations on you gaining some weight back.  We are sending in the referral to [facility] for you to get your endoscopic ultrasound done, you will likely follow-up with them at least once after your procedure.  As mentioned also, tobacco cessation will be helpful in decreasing your symptoms.  We will tentatively set you up for follow-up in 6 months.  It was a pleasure to see you today. I want to create trusting relationships with patients. If you receive a survey regarding your visit,  I greatly appreciate you taking time to fill this out on paper or through your MyChart. I value your feedback.  Venetia Night, MSN, FNP-BC, AGACNP-BC [facility]

## 2021-08-17 ENCOUNTER — Telehealth: Payer: Self-pay

## 2021-08-17 NOTE — Chronic Care Management (AMB) (Signed)
  Care Management   Outreach Note  08/17/2021 Name: REANNAH TOTTEN MRN: [id number] DOB: [birth year]  Referred by: Janora Norlander, DO Reason for referral : Care Coordination (Outreach to schedule referral with [facility] )   An unsuccessful telephone outreach was attempted today. The patient was referred to the case management team for assistance with care management and care coordination.   Follow Up Plan:  A HIPAA compliant phone message was left for the patient providing contact information and requesting a return call.  The care management team will reach out to the patient again over the next 7 days.  If patient returns call to provider office, please advise to call Embedded Care Management Care Guide Noreene Larsson * at [phone]*  Noreene Larsson, [place], [place] [facility]  [place], [place] [address] Direct Dial: [phone] [email or web address]: [email or web address]

## 2021-08-18 ENCOUNTER — Ambulatory Visit: Payer: Medicare Other | Admitting: Family Medicine

## 2021-08-19 NOTE — Chronic Care Management (AMB) (Signed)
  Care Management   Outreach Note  08/19/2021 Name: Jasmine [name] MRN: [id number] DOB: March 26, 1955  Referred by: Janora Norlander, DO Reason for referral : Care Coordination (Outreach to schedule referral with [facility] )   A second unsuccessful telephone outreach was attempted today. The patient was referred to the case management team for assistance with care management and care coordination.   Follow Up Plan:  A HIPAA compliant phone message was left for the patient providing contact information and requesting a return call.  The care management team will reach out to the patient again over the next 7 days.  If patient returns call to provider office, please advise to call [place] * at [phone]*  Noreene Larsson, [place], [place] [facility]  [place], [place] [address] Direct Dial: [phone] [email or web address]: [email or web address]

## 2021-08-22 ENCOUNTER — Inpatient Hospital Stay (HOSPITAL_COMMUNITY): Payer: Medicare Other | Admitting: Hematology

## 2021-08-25 NOTE — Chronic Care Management (AMB) (Signed)
  Care Management   Outreach Note  08/25/2021 Name: Jasmine [name] MRN: [id number] DOB: 1955-09-08  Referred by: Janora Norlander, DO Reason for referral : Care Coordination (Outreach to schedule referral with [facility] )   Third unsuccessful telephone outreach was attempted today. The patient was referred to the case management team for assistance with care management and care coordination. The patient's primary care provider has been notified of our unsuccessful attempts to make or maintain contact with the patient. The care management team is pleased to engage with this patient at any time in the future should he/she be interested in assistance from the care management team.   Follow Up Plan:  We have been unable to make contact with the patient for follow up. The care management team is available to follow up with the patient after provider conversation with the patient regarding recommendation for care management engagement and subsequent re-referral to the care management team.   Noreene Larsson, [place], [place], [place] [address] Direct Dial: [phone] [email or web address]: [email or web address]

## 2021-08-26 ENCOUNTER — Ambulatory Visit: Payer: Medicare Other | Admitting: Internal Medicine

## 2021-08-26 ENCOUNTER — Other Ambulatory Visit: Payer: Self-pay | Admitting: Family Medicine

## 2021-08-26 ENCOUNTER — Telehealth: Payer: Self-pay | Admitting: Pharmacist

## 2021-08-26 DIAGNOSIS — J449 Chronic obstructive pulmonary disease, unspecified: Secondary | ICD-10-CM

## 2021-08-26 MED ORDER — ALBUTEROL SULFATE HFA 108 (90 BASE) MCG/ACT IN AERS
2.0000 | INHALATION_SPRAY | Freq: Four times a day (QID) | RESPIRATORY_TRACT | 3 refills | Status: DC | PRN
Start: 2021-08-26 — End: 2021-09-15

## 2021-08-26 MED ORDER — ALBUTEROL SULFATE HFA 108 (90 BASE) MCG/ACT IN AERS: 2.0000 | INHALATION_SPRAY | Freq: Four times a day (QID) | RESPIRATORY_TRACT | 3 refills | Status: DC | PRN

## 2021-08-29 ENCOUNTER — Inpatient Hospital Stay (HOSPITAL_COMMUNITY): Payer: Medicare Other | Admitting: Hematology

## 2021-08-29 ENCOUNTER — Encounter (HOSPITAL_COMMUNITY): Payer: Self-pay

## 2021-09-01 ENCOUNTER — Ambulatory Visit: Payer: Medicare Other | Admitting: Internal Medicine

## 2021-09-04 ENCOUNTER — Other Ambulatory Visit: Payer: Self-pay | Admitting: Nurse Practitioner

## 2021-09-09 ENCOUNTER — Emergency Department (HOSPITAL_COMMUNITY): Payer: Medicare Other

## 2021-09-09 ENCOUNTER — Inpatient Hospital Stay (HOSPITAL_COMMUNITY)
Admission: EM | Admit: 2021-09-09 | Discharge: 2021-09-13 | DRG: 871 | Disposition: A | Discharge status: Home / Self Care | Payer: Medicare Other | Attending: Internal Medicine | Admitting: Internal Medicine

## 2021-09-09 ENCOUNTER — Encounter (HOSPITAL_COMMUNITY): Payer: Self-pay

## 2021-09-09 ENCOUNTER — Other Ambulatory Visit: Payer: Self-pay

## 2021-09-09 DIAGNOSIS — J449 Chronic obstructive pulmonary disease, unspecified: Secondary | ICD-10-CM | POA: Diagnosis not present

## 2021-09-09 DIAGNOSIS — Z20822 Contact with and (suspected) exposure to covid-19: Secondary | ICD-10-CM | POA: Diagnosis present

## 2021-09-09 DIAGNOSIS — Z902 Acquired absence of lung [part of]: Secondary | ICD-10-CM | POA: Diagnosis not present

## 2021-09-09 DIAGNOSIS — R6510 Systemic inflammatory response syndrome (SIRS) of non-infectious origin without acute organ dysfunction: Secondary | ICD-10-CM | POA: Diagnosis not present

## 2021-09-09 DIAGNOSIS — K219 Gastro-esophageal reflux disease without esophagitis: Secondary | ICD-10-CM | POA: Diagnosis present

## 2021-09-09 DIAGNOSIS — I10 Essential (primary) hypertension: Secondary | ICD-10-CM | POA: Diagnosis present

## 2021-09-09 DIAGNOSIS — J439 Emphysema, unspecified: Secondary | ICD-10-CM | POA: Diagnosis present

## 2021-09-09 DIAGNOSIS — Z7951 Long term (current) use of inhaled steroids: Secondary | ICD-10-CM

## 2021-09-09 DIAGNOSIS — G5693 Unspecified mononeuropathy of bilateral upper limbs: Secondary | ICD-10-CM | POA: Diagnosis present

## 2021-09-09 DIAGNOSIS — M549 Dorsalgia, unspecified: Secondary | ICD-10-CM | POA: Diagnosis present

## 2021-09-09 DIAGNOSIS — J189 Pneumonia, unspecified organism: Secondary | ICD-10-CM | POA: Diagnosis present

## 2021-09-09 DIAGNOSIS — E440 Moderate protein-calorie malnutrition: Secondary | ICD-10-CM | POA: Diagnosis present

## 2021-09-09 DIAGNOSIS — Z9981 Dependence on supplemental oxygen: Secondary | ICD-10-CM

## 2021-09-09 DIAGNOSIS — Z79891 Long term (current) use of opiate analgesic: Secondary | ICD-10-CM

## 2021-09-09 DIAGNOSIS — Z85118 Personal history of other malignant neoplasm of bronchus and lung: Secondary | ICD-10-CM | POA: Diagnosis not present

## 2021-09-09 DIAGNOSIS — Z87442 Personal history of urinary calculi: Secondary | ICD-10-CM

## 2021-09-09 DIAGNOSIS — Z681 Body mass index (BMI) 19 or less, adult: Secondary | ICD-10-CM

## 2021-09-09 DIAGNOSIS — F17210 Nicotine dependence, cigarettes, uncomplicated: Secondary | ICD-10-CM | POA: Diagnosis present

## 2021-09-09 DIAGNOSIS — Z808 Family history of malignant neoplasm of other organs or systems: Secondary | ICD-10-CM | POA: Diagnosis not present

## 2021-09-09 DIAGNOSIS — Z90710 Acquired absence of both cervix and uterus: Secondary | ICD-10-CM | POA: Diagnosis not present

## 2021-09-09 DIAGNOSIS — G8929 Other chronic pain: Secondary | ICD-10-CM | POA: Diagnosis present

## 2021-09-09 DIAGNOSIS — R1030 Lower abdominal pain, unspecified: Secondary | ICD-10-CM | POA: Diagnosis not present

## 2021-09-09 DIAGNOSIS — K861 Other chronic pancreatitis: Secondary | ICD-10-CM | POA: Diagnosis present

## 2021-09-09 DIAGNOSIS — J9611 Chronic respiratory failure with hypoxia: Secondary | ICD-10-CM | POA: Diagnosis present

## 2021-09-09 DIAGNOSIS — Z825 Family history of asthma and other chronic lower respiratory diseases: Secondary | ICD-10-CM | POA: Diagnosis not present

## 2021-09-09 DIAGNOSIS — Z79899 Other long term (current) drug therapy: Secondary | ICD-10-CM

## 2021-09-09 DIAGNOSIS — Z83438 Family history of other disorder of lipoprotein metabolism and other lipidemia: Secondary | ICD-10-CM

## 2021-09-09 DIAGNOSIS — A419 Sepsis, unspecified organism: Secondary | ICD-10-CM | POA: Diagnosis present

## 2021-09-09 DIAGNOSIS — R0602 Shortness of breath: Secondary | ICD-10-CM | POA: Diagnosis present

## 2021-09-09 DIAGNOSIS — Z8249 Family history of ischemic heart disease and other diseases of the circulatory system: Secondary | ICD-10-CM | POA: Diagnosis not present

## 2021-09-09 DIAGNOSIS — E785 Hyperlipidemia, unspecified: Secondary | ICD-10-CM | POA: Diagnosis present

## 2021-09-09 DIAGNOSIS — R109 Unspecified abdominal pain: Secondary | ICD-10-CM

## 2021-09-09 LAB — RESP PANEL BY RT-PCR (FLU A&B, COVID) ARPGX2
Influenza A by PCR: NEGATIVE
Influenza B by PCR: NEGATIVE
SARS Coronavirus 2 by RT PCR: NEGATIVE

## 2021-09-09 LAB — CBC WITH DIFFERENTIAL/PLATELET
Abs Immature Granulocytes: 0.18 10*3/uL — ABNORMAL HIGH (ref 0.00–0.07)
Basophils Absolute: 0.1 10*3/uL (ref 0.0–0.1)
Basophils Relative: 0 %
Eosinophils Absolute: 0 10*3/uL (ref 0.0–0.5)
Eosinophils Relative: 0 %
HCT: 34.9 % — ABNORMAL LOW (ref 36.0–46.0)
Hemoglobin: 10.9 g/dL — ABNORMAL LOW (ref 12.0–15.0)
Immature Granulocytes: 1 %
Lymphocytes Relative: 7 %
Lymphs Abs: 1.6 10*3/uL (ref 0.7–4.0)
MCH: 33.4 pg (ref 26.0–34.0)
MCHC: 31.2 g/dL (ref 30.0–36.0)
MCV: 107.1 fL — ABNORMAL HIGH (ref 80.0–100.0)
Monocytes Absolute: 1.4 10*3/uL — ABNORMAL HIGH (ref 0.1–1.0)
Monocytes Relative: 6 %
Neutro Abs: 19.3 10*3/uL — ABNORMAL HIGH (ref 1.7–7.7)
Neutrophils Relative %: 86 %
Platelets: 314 10*3/uL (ref 150–400)
RBC: 3.26 MIL/uL — ABNORMAL LOW (ref 3.87–5.11)
RDW: 13.7 % (ref 11.5–15.5)
WBC: 22.6 10*3/uL — ABNORMAL HIGH (ref 4.0–10.5)
nRBC: 0 % (ref 0.0–0.2)

## 2021-09-09 LAB — BRAIN NATRIURETIC PEPTIDE: B Natriuretic Peptide: 79 pg/mL (ref 0.0–100.0)

## 2021-09-09 LAB — COMPREHENSIVE METABOLIC PANEL
ALT: 25 U/L (ref 0–44)
AST: 32 U/L (ref 15–41)
Albumin: 3.2 g/dL — ABNORMAL LOW (ref 3.5–5.0)
Alkaline Phosphatase: 103 U/L (ref 38–126)
Anion gap: 6 (ref 5–15)
BUN: 13 mg/dL (ref 8–23)
CO2: 26 mmol/L (ref 22–32)
Calcium: 9.5 mg/dL (ref 8.9–10.3)
Chloride: 102 mmol/L (ref 98–111)
Creatinine, Ser: 0.43 mg/dL — ABNORMAL LOW (ref 0.44–1.00)
GFR, Estimated: >60 mL/min (ref >60)
Glucose, Bld: 133 mg/dL — ABNORMAL HIGH (ref 70–99)
Potassium: 3.8 mmol/L (ref 3.5–5.1)
Sodium: 134 mmol/L — ABNORMAL LOW (ref 135–145)
Total Bilirubin: 0.4 mg/dL (ref 0.3–1.2)
Total Protein: 6.9 g/dL (ref 6.5–8.1)

## 2021-09-09 LAB — APTT: aPTT: 30 seconds (ref 24–36)

## 2021-09-09 LAB — LACTIC ACID, PLASMA
Lactic Acid, Venous: 0.9 mmol/L (ref 0.5–1.9)
Lactic Acid, Venous: 0.9 mmol/L (ref 0.5–1.9)

## 2021-09-09 LAB — PROTIME-INR
INR: 1 (ref 0.8–1.2)
Prothrombin Time: 13 seconds (ref 11.4–15.2)

## 2021-09-09 LAB — TROPONIN I (HIGH SENSITIVITY)
Troponin I (High Sensitivity): 10 ng/L (ref <18)
Troponin I (High Sensitivity): 17 ng/L (ref <18)

## 2021-09-09 MED ORDER — PANCRELIPASE (LIP-PROT-AMYL) 12000-38000 UNITS PO CPEP
36000.0000 [IU] | ORAL_CAPSULE | Freq: Three times a day (TID) | ORAL | Status: DC
  Administered 2021-09-10 – 2021-09-13 (×11): 36000 [IU] via ORAL
  Filled 2021-09-09 (×11): qty 3

## 2021-09-09 MED ORDER — TIOTROPIUM BROMIDE MONOHYDRATE 2.5 MCG/ACT IN AERS: 2.0000 | INHALATION_SPRAY | Freq: Every day | RESPIRATORY_TRACT | Status: DC

## 2021-09-09 MED ORDER — TIZANIDINE HCL 4 MG PO TABS
4.0000 mg | ORAL_TABLET | Freq: Three times a day (TID) | ORAL | Status: DC | PRN
  Administered 2021-09-10 – 2021-09-13 (×10): 4 mg via ORAL
  Filled 2021-09-09 (×10): qty 1

## 2021-09-09 MED ORDER — ALBUTEROL SULFATE (2.5 MG/3ML) 0.083% IN NEBU
2.5000 mg | INHALATION_SOLUTION | RESPIRATORY_TRACT | Status: DC | PRN
Start: 2021-09-09 — End: 2021-09-13
  Administered 2021-09-10 – 2021-09-13 (×6): 2.5 mg via RESPIRATORY_TRACT
  Filled 2021-09-09 (×5): qty 3

## 2021-09-09 MED ORDER — SODIUM CHLORIDE 0.9 % IV SOLN
2.0000 g | INTRAVENOUS | Status: DC
  Administered 2021-09-10 – 2021-09-13 (×4): 2 g via INTRAVENOUS
  Filled 2021-09-09 (×4): qty 20

## 2021-09-09 MED ORDER — SODIUM CHLORIDE 0.9 % IV SOLN
2.0000 g | INTRAVENOUS | Status: DC
  Filled 2021-09-09: qty 20

## 2021-09-09 MED ORDER — DIPHENHYDRAMINE HCL 50 MG/ML IJ SOLN: 25.0000 mg | Freq: Four times a day (QID) | INTRAMUSCULAR | Status: DC | PRN

## 2021-09-09 MED ORDER — ATORVASTATIN CALCIUM 40 MG PO TABS
40.0000 mg | ORAL_TABLET | Freq: Every day | ORAL | Status: DC
  Administered 2021-09-10 – 2021-09-13 (×4): 40 mg via ORAL
  Filled 2021-09-09 (×4): qty 1

## 2021-09-09 MED ORDER — PANTOPRAZOLE SODIUM 40 MG PO TBEC
40.0000 mg | DELAYED_RELEASE_TABLET | Freq: Every day | ORAL | Status: DC
  Administered 2021-09-10 – 2021-09-13 (×4): 40 mg via ORAL
  Filled 2021-09-09 (×4): qty 1

## 2021-09-09 MED ORDER — HEPARIN SODIUM (PORCINE) 5000 UNIT/ML IJ SOLN
5000.0000 [IU] | Freq: Three times a day (TID) | INTRAMUSCULAR | Status: DC
  Administered 2021-09-09 – 2021-09-13 (×12): 5000 [IU] via SUBCUTANEOUS
  Filled 2021-09-09 (×11): qty 1

## 2021-09-09 MED ORDER — TRAZODONE HCL 50 MG PO TABS
150.0000 mg | ORAL_TABLET | Freq: Every day | ORAL | Status: DC
  Administered 2021-09-09 – 2021-09-12 (×4): 150 mg via ORAL
  Filled 2021-09-09 (×4): qty 3

## 2021-09-09 MED ORDER — MOMETASONE FURO-FORMOTEROL FUM 200-5 MCG/ACT IN AERO
2.0000 | INHALATION_SPRAY | Freq: Two times a day (BID) | RESPIRATORY_TRACT | Status: DC
  Administered 2021-09-09 – 2021-09-13 (×8): 2 via RESPIRATORY_TRACT
  Filled 2021-09-09: qty 8.8

## 2021-09-09 MED ORDER — SODIUM CHLORIDE 0.9 % IV SOLN
2.0000 g | Freq: Once | INTRAVENOUS | Status: AC
  Administered 2021-09-09: 2 g via INTRAVENOUS
  Filled 2021-09-09: qty 12.5

## 2021-09-09 MED ORDER — DM-GUAIFENESIN ER 30-600 MG PO TB12
1.0000 | ORAL_TABLET | Freq: Two times a day (BID) | ORAL | Status: DC
  Administered 2021-09-09 – 2021-09-13 (×8): 1 via ORAL
  Filled 2021-09-09 (×8): qty 1

## 2021-09-09 MED ORDER — AMLODIPINE BESYLATE 5 MG PO TABS
10.0000 mg | ORAL_TABLET | Freq: Every day | ORAL | Status: DC
  Administered 2021-09-10 – 2021-09-13 (×4): 10 mg via ORAL
  Filled 2021-09-09 (×4): qty 2

## 2021-09-09 MED ORDER — MIRTAZAPINE 15 MG PO TABS
7.5000 mg | ORAL_TABLET | Freq: Every day | ORAL | Status: DC
  Administered 2021-09-09 – 2021-09-12 (×4): 7.5 mg via ORAL
  Filled 2021-09-09 (×4): qty 1

## 2021-09-09 MED ORDER — OXYCODONE-ACETAMINOPHEN 7.5-325 MG PO TABS
1.0000 | ORAL_TABLET | Freq: Four times a day (QID) | ORAL | Status: DC | PRN
  Administered 2021-09-10 (×3): 1 via ORAL
  Filled 2021-09-09 (×4): qty 1

## 2021-09-09 MED ORDER — BISOPROLOL FUMARATE 5 MG PO TABS
2.5000 mg | ORAL_TABLET | Freq: Every day | ORAL | Status: DC
  Administered 2021-09-10 – 2021-09-13 (×4): 2.5 mg via ORAL
  Filled 2021-09-09 (×4): qty 1

## 2021-09-09 MED ORDER — ENSURE ENLIVE PO LIQD
237.0000 mL | Freq: Two times a day (BID) | ORAL | Status: DC
  Administered 2021-09-10 – 2021-09-13 (×7): 237 mL via ORAL
  Filled 2021-09-09: qty 237

## 2021-09-09 MED ORDER — IOHEXOL 350 MG/ML SOLN
75.0000 mL | Freq: Once | INTRAVENOUS | Status: AC | PRN
Start: 2021-09-09 — End: 2021-09-09
  Administered 2021-09-09: 75 mL via INTRAVENOUS

## 2021-09-09 MED ORDER — ACETAMINOPHEN 325 MG PO TABS
650.0000 mg | ORAL_TABLET | Freq: Four times a day (QID) | ORAL | Status: DC | PRN
  Administered 2021-09-10 – 2021-09-13 (×6): 650 mg via ORAL
  Filled 2021-09-09 (×6): qty 2

## 2021-09-09 MED ORDER — SODIUM CHLORIDE 0.9 % IV SOLN
500.0000 mg | INTRAVENOUS | Status: DC
  Administered 2021-09-09 – 2021-09-12 (×4): 500 mg via INTRAVENOUS
  Filled 2021-09-09 (×4): qty 5

## 2021-09-09 MED ORDER — OXYCODONE HCL ER 15 MG PO T12A
15.0000 mg | EXTENDED_RELEASE_TABLET | Freq: Two times a day (BID) | ORAL | Status: DC
  Administered 2021-09-10 – 2021-09-13 (×7): 15 mg via ORAL
  Filled 2021-09-09 (×7): qty 1

## 2021-09-09 MED ORDER — UMECLIDINIUM BROMIDE 62.5 MCG/ACT IN AEPB
1.0000 | INHALATION_SPRAY | Freq: Every day | RESPIRATORY_TRACT | Status: DC
  Administered 2021-09-10 – 2021-09-13 (×4): 1 via RESPIRATORY_TRACT
  Filled 2021-09-09: qty 7

## 2021-09-09 MED ORDER — VANCOMYCIN HCL IN DEXTROSE 1-5 GM/200ML-% IV SOLN
1000.0000 mg | INTRAVENOUS | Status: DC
  Administered 2021-09-09: 1000 mg via INTRAVENOUS
  Filled 2021-09-09: qty 200

## 2021-09-09 NOTE — Assessment & Plan Note (Addendum)
-  Continue supplementation of pancreatic enzymes with meals -continue chronic pain meds. -Continue patient follow-up with PCP/GI service.

## 2021-09-09 NOTE — Assessment & Plan Note (Signed)
-  Pneumonia finding appreciated on CT -still feeling Short winded with activity and having productive coughing spells; but improving and feeling better today.. -patient met sepsis criteria on admission with fever, leukocytosis and tachypnea. -will continue bronchodilators and current IV antibiotics. -follow culture results.  -Repeat chest x-ray stable and suggesting superimposed atelectasis/pneumonia on previous catheter General airspace opacities from previous surgical intervention.

## 2021-09-09 NOTE — Assessment & Plan Note (Signed)
Continue statin. 

## 2021-09-09 NOTE — ED Notes (Signed)
Patient transported to CT 

## 2021-09-09 NOTE — Assessment & Plan Note (Signed)
-   This is 22.6, febrile to 101, tachycardia 101, tachypnea 22 -Cute endorgan damage -For pneumonia

## 2021-09-09 NOTE — Assessment & Plan Note (Addendum)
-  albumin 3.2 -BMI 16.60 -continue the use of feeding supplements -Patient advised to maintain adequate hydration.

## 2021-09-09 NOTE — ED Notes (Signed)
Patient transported to X-ray 

## 2021-09-09 NOTE — Assessment & Plan Note (Signed)
Continue PPI ?

## 2021-09-09 NOTE — Assessment & Plan Note (Signed)
-  Continue Dulera and Spiriva -continue PRN albuterol. -Continue baseline oxygen supplementation

## 2021-09-09 NOTE — ED Provider Notes (Signed)
With [facility], In [facility] MEDICAL SURGICAL UNIT Provider Note  CSN: [id number] Arrival date & time: 09/09/21 1918  Chief Complaint(s) Shortness of Breath (Hx lung CA -R upper lobectomy)  HPI Jasmine [name] is a 66 y.o. female with PMH COPD, GERD, lung cancer status post right upper lobectomy on home 4 to 5 L who presents emergency department for evaluation of cough and fever.  Patient states that over the last 24 hours she has developed fever, body aches, chills and a persistent cough.  She has a previous admission in early June for pneumonia and thinks that this is similar to her previous presentation.  Denies chest pain, shortness of breath, abdominal pain, nausea, vomiting or other systemic symptoms.   Past Medical History Past Medical History:  Diagnosis Date   Allergy    Anemia    Anxiety    Arthritis    Asthma    Carpal tunnel syndrome    Chronic back pain    Cigarette smoker 08/25/2017   Complication of anesthesia    in the past has had N/V, the last few surgeries she has not been   COPD (chronic obstructive pulmonary disease) (HCC)    Easy bruising 07/10/2016   GERD (gastroesophageal reflux disease)    Headache    Heart murmur 2005   never had any problems   History of blood transfusion 2018   History of kidney stones    Hypertension    Hypoglycemia    Hypoglycemia    Iron deficiency anemia 07/11/2016   Neuropathy    both arms   Normocytic anemia 07/29/2015   Pancreatitis    Pneumonia    PONV (postoperative nausea and vomiting)    Postoperative anemia due to acute blood loss 11/15/2015   R lung cancer    Sciatica    Seizures (HCC)    as a child when she would have an asthma attack. none as an adult   Shortness of breath dyspnea    Patient Active Problem List   Diagnosis Date Noted   SIRS (systemic inflammatory response syndrome) (HCC) 09/09/2021   Chronic pancreatitis (HCC) 09/09/2021   Hyperlipidemia 09/09/2021   Pneumonia 08/04/2021   PNA (pneumonia) 08/03/2021    Abdominal pain 06/29/2021   Chronic pain syndrome 03/13/2021   Pressure injury of skin 03/12/2021   Acute on chronic pancreatitis (HCC) 03/12/2021   Intractable abdominal pain 03/11/2021   Intractable pain 02/12/2021   Squamous cell lung cancer, right (HCC) 01/13/2021   Atypical chest pain 11/26/2020   Elevated MCV 11/26/2020   Intractable nausea and vomiting 11/26/2020   History of Clostridioides difficile infection 11/26/2020   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HCC) 10/11/2020   Gastroenteritis 10/11/2020   Acute gastroenteritis 10/10/2020   Protein-calorie malnutrition, severe 08/26/2020   Chest pain 08/25/2020   Pleuritic chest pain 08/25/2020   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   GERD (gastroesophageal reflux disease) 04/22/2020   Abnormal CT of the abdomen 04/22/2020   Preventative health care 04/22/2020   Solitary pulmonary nodule 03/22/2020   Pulmonary infiltrate on chest x-ray 02/24/2020   Nocturnal cough 02/19/2020   Pancreatitis, acute 01/06/2019   Acute pancreatitis 01/05/2019   Cigarette smoker 08/25/2017   Chronic respiratory failure with hypoxia (HCC) 08/25/2017   Centrilobular emphysema (HCC) 07/09/2017   Aortic atherosclerosis (HCC) 07/09/2017   Hepatic steatosis 07/09/2017   Malnutrition of moderate degree 07/02/2017   Acute respiratory failure with hypoxia (HCC) 06/30/2017   Hypokalemia 06/30/2017   COPD with acute exacerbation (HCC) 06/30/2017  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/2016   Tobacco abuse 08/04/2016   Iron deficiency anemia 07/11/2016   Easy bruising 07/10/2016   Lumbar stenosis 11/11/2015   Leg pain 09/24/2015   Vitamin D deficiency 07/29/2015   Chronic back pain 02/22/2015   HTN (hypertension) 02/22/2015   Chronic narcotic use 02/22/2015   COPD GOLD II  02/22/2015   Headache 02/22/2015   Home Medication(s) Prior to Admission medications   Medication Sig Start Date End Date Taking? Authorizing Provider  albuterol (VENTOLIN HFA) 108 (90 Base)  MCG/ACT inhaler Inhale 2 puffs into the lungs every 6 (six) hours as needed for wheezing or shortness of breath. 08/26/21  Yes Dettinger, Elige Radon, MD  amLODipine (NORVASC) 10 MG tablet Take 1 tablet (10 mg total) by mouth daily. 08/07/21  Yes Emokpae, Courage, MD  atorvastatin (LIPITOR) 40 MG tablet Take 1 tablet (40 mg total) by mouth daily. 07/09/20  Yes Gottschalk, Kathie Rhodes M, DO  bisoprolol (ZEBETA) 5 MG tablet Take 0.5 tablets (2.5 mg total) by mouth daily. Take in Place of Metoprolol 08/07/21  Yes Emokpae, Courage, MD  Cholecalciferol 125 MCG (5000 UT) capsule Take 5,000 Units by mouth daily.   Yes [provider]  dextromethorphan (DELSYM) 30 MG/5ML liquid Take 15 mg by mouth as needed for cough.   Yes [provider]  dextromethorphan-guaiFENesin (MUCINEX DM) 30-600 MG 12hr tablet Take 1 tablet by mouth 2 (two) times daily.   Yes [provider]  diclofenac Sodium (VOLTAREN) 1 % GEL Apply 2 g topically 4 (four) times daily. 09/05/21  Yes [provider]  fluticasone-salmeterol (ADVAIR DISKUS) 250-50 MCG/ACT AEPB Inhale 1 puff into the lungs in the morning and at bedtime. 08/07/21  Yes Shon Hale, MD  lipase/protease/amylase 24000-76000 units CPEP Take 2 caps daily with meals and 1 cap with snacks Patient taking differently: Take 1-2 capsules by mouth See admin instructions. Take 2 capsules by mouth daily with meals; and take 1 capsule by mouth with snacks. 07/03/21  Yes Johnson, Clanford L, MD  mirtazapine (REMERON) 7.5 MG tablet Take 1 tablet (7.5 mg total) by mouth at bedtime. Cross taper with 30mg  of Cymbalta for 3-4 weeks. Then stop cymbalta 07/11/21  Yes Gottschalk, Ashly M, DO  omeprazole (PRILOSEC) 40 MG capsule Take one capsule once to twice daily before a meal. Patient taking differently: Take 40 mg by mouth in the morning and at bedtime. Take one capsule once to twice daily before a meal. 01/18/21  Yes Tiffany Kocher, PA-C  oxybutynin (DITROPAN-XL) 5 MG 24  hr tablet TAKE 1 TABLET AT BEDTIME Patient taking differently: Take 5 mg by mouth at bedtime. 09/05/21  Yes Gottschalk, Kathie Rhodes M, DO  oxyCODONE-acetaminophen (PERCOCET) 10-325 MG tablet Take 1 tablet by mouth 5 (five) times daily. 08/27/20  Yes [provider]  OXYCONTIN 15 MG 12 hr tablet Take 15 mg by mouth every 12 (twelve) hours. 08/28/20  Yes [provider]  prochlorperazine (COMPAZINE) 25 MG suppository Place 1 suppository (25 mg total) rectally every 12 (twelve) hours as needed for nausea or vomiting. 08/07/21  Yes Emokpae, Courage, MD  Tiotropium Bromide Monohydrate (SPIRIVA RESPIMAT) 2.5 MCG/ACT AERS Inhale 2 puffs into the lungs daily. 08/07/21  Yes Emokpae, Courage, MD  tizanidine (ZANAFLEX) 6 MG capsule Take 6 mg by mouth 3 (three) times daily. 09/05/21  Yes [provider]  traZODone (DESYREL) 150 MG tablet TAKE 1 TO 2 TABLETS AT BEDTIME Patient taking differently: Take 150 mg by mouth at bedtime. 03/30/21  Yes Delynn Flavin M, DO  Nutritional Supplements (ENSURE HIGH PROTEIN) LIQD Drink 1 can 3 times daily. 08/30/20   Raliegh Ip, DO  OXYGEN Inhale 4 L into the lungs See admin instructions. continuous    [provider]                                                                                                                                    Past Surgical History Past Surgical History:  Procedure Laterality Date   ABDOMINAL HYSTERECTOMY  2005   APPLICATION OF ROBOTIC ASSISTANCE FOR SPINAL PROCEDURE N/A 09/05/2016   Procedure: APPLICATION OF ROBOTIC ASSISTANCE FOR SPINAL PROCEDURE;  Surgeon: Lisbeth Renshaw, MD;  Location: [facility] OR;  Service: Neurosurgery;  Laterality: N/A;   BACK SURGERY     BIOPSY  06/14/2020   Procedure: BIOPSY;  Surgeon: Corbin Ade, MD;  Location: [facility];  Service: Endoscopy;;   CERVICAL FUSION     CESAREAN SECTION  1982   COLONOSCOPY     unsure last   COLONOSCOPY WITH PROPOFOL N/A 06/14/2020    Procedure: COLONOSCOPY WITH PROPOFOL;  Surgeon: Corbin Ade, MD;  Location: [facility];  Service: Endoscopy;  Laterality: N/A;  PM   cyst removal face     disk repair     neck and lumbar region   ESOPHAGOGASTRODUODENOSCOPY (EGD) WITH PROPOFOL N/A 06/14/2020   Procedure: ESOPHAGOGASTRODUODENOSCOPY (EGD) WITH PROPOFOL;  Surgeon: Corbin Ade, MD;  Location: [facility];  Service: Endoscopy;  Laterality: N/A;   FOOT SURGERY Bilateral    to file bones down    HARDWARE REMOVAL N/A 09/05/2016   Procedure: HARDWARE REMOVAL AND REPLACEMENT OF LUMBAR FIVE SCREWS;  Surgeon: Lisbeth Renshaw, MD;  Location: [facility] OR;  Service: Neurosurgery;  Laterality: N/A;   IMPLANTATION / PLACEMENT EPIDURAL NEUROSTIMULATOR ELECTRODES     INTERCOSTAL NERVE BLOCK Right 07/29/2020   Procedure: INTERCOSTAL NERVE BLOCK;  Surgeon: Loreli Slot, MD;  Location: [facility] OR;  Service: Thoracic;  Laterality: Right;   LAMINECTOMY     2006   lobectomy Right 07/2020   LUMBAR FUSION     LYMPH NODE BIOPSY Right 07/29/2020   Procedure: LYMPH NODE BIOPSY;  Surgeon: Loreli Slot, MD;  Location: [facility] OR;  Service: Thoracic;  Laterality: Right;   POLYPECTOMY  06/14/2020   Procedure: POLYPECTOMY INTESTINAL;  Surgeon: Corbin Ade, MD;  Location: [facility];  Service: Endoscopy;;   SPINAL CORD STIMULATOR BATTERY EXCHANGE     SPINAL CORD STIMULATOR INSERTION N/A 07/05/2016   Procedure: REPLACEMENT OF LUMBAR SPINAL CORD STIMULATOR BATTERY;  Surgeon: Tressie Stalker, MD;  Location: [facility] OR;  Service: Neurosurgery;  Laterality: N/A;   TONSILLECTOMY     Family History Family History  Problem Relation Age of Onset   Heart disease Mother    Diabetes Mother    Hypertension Mother    Hyperlipidemia Mother    COPD Mother  smoked   Cancer Mother        bile duct   COPD Father    Diabetes Father    Heart disease Father    Cancer Father        throat   Dementia Father    Asthma Son    Hypertension  Sister    Hyperlipidemia Sister    Hypertension Brother    Hyperlipidemia Brother    Hypertension Brother    Hyperlipidemia Brother    Emphysema Maternal Grandmother        smoked   Colon cancer Neg Hx    Colon polyps Neg Hx     Social History Social History   Tobacco Use   Smoking status: Every Day    Packs/day: 0.25    Years: 35.00    Total pack years: 8.75    Types: Cigarettes   Smokeless tobacco: Never   Tobacco comments:    trying to quit  Vaping Use   Vaping Use: Never used  Substance Use Topics   Alcohol use: Yes    Alcohol/week: 2.0 standard drinks of alcohol    Types: 2 Glasses of wine per week    Comment: occasionally   Drug use: No   Allergies Lyrica [pregabalin], Darvon [propoxyphene], Gabapentin, Augmentin [amoxicillin-pot clavulanate], Erythromycin, and Vibramycin [doxycycline calcium]  Review of Systems Review of Systems  Constitutional:  Positive for chills and fever.  Respiratory:  Positive for cough.   Musculoskeletal:  Positive for myalgias.    Physical Exam Vital Signs  I have reviewed the triage vital signs BP (!) 104/58 (BP Location: Left Arm)   Pulse 76   Temp 98 F (36.7 C) (Oral)   Resp 18   Ht 5\' 3"  (1.6 m)   Wt 42.3 kg   SpO2 100%   BMI 16.52 kg/m   Physical Exam Vitals and nursing note reviewed.  Constitutional:      General: She is not in acute distress.    Appearance: She is well-developed.  HENT:     Head: Normocephalic and atraumatic.  Eyes:     Conjunctiva/sclera: Conjunctivae normal.  Cardiovascular:     Rate and Rhythm: Normal rate and regular rhythm.     Heart sounds: No murmur heard. Pulmonary:     Effort: Pulmonary effort is normal. No respiratory distress.     Breath sounds: Rales present.  Abdominal:     Palpations: Abdomen is soft.     Tenderness: There is no abdominal tenderness.  Musculoskeletal:        General: No swelling.     Cervical back: Neck supple.  Skin:    General: Skin is warm and dry.      Capillary Refill: Capillary refill takes less than 2 seconds.  Neurological:     Mental Status: She is alert.  Psychiatric:        Mood and Affect: Mood normal.     ED Results and Treatments Labs (all labs ordered are listed, but only abnormal results are displayed) Labs Reviewed  COMPREHENSIVE METABOLIC PANEL - Abnormal; Notable for the following components:      Result Value   Sodium 134 (*)    Glucose, Bld 133 (*)    Creatinine, Ser 0.43 (*)    Albumin 3.2 (*)    All other components within normal limits  CBC WITH DIFFERENTIAL/PLATELET - Abnormal; Notable for the following components:   WBC 22.6 (*)    RBC 3.26 (*)    Hemoglobin 10.9 (*)  HCT 34.9 (*)    MCV 107.1 (*)    Neutro Abs 19.3 (*)    Monocytes Absolute 1.4 (*)    Abs Immature Granulocytes 0.18 (*)    All other components within normal limits  URINALYSIS, ROUTINE W REFLEX MICROSCOPIC - Abnormal; Notable for the following components:   Specific Gravity, Urine 1.040 (*)    Protein, ur 30 (*)    All other components within normal limits  CBC WITH DIFFERENTIAL/PLATELET - Abnormal; Notable for the following components:   WBC 12.5 (*)    RBC 3.00 (*)    Hemoglobin 9.9 (*)    HCT 32.8 (*)    MCV 109.3 (*)    Neutro Abs 9.6 (*)    Abs Immature Granulocytes 0.08 (*)    All other components within normal limits  COMPREHENSIVE METABOLIC PANEL - Abnormal; Notable for the following components:   Potassium 3.3 (*)    Glucose, Bld 118 (*)    Creatinine, Ser 0.37 (*)    Total Protein 6.0 (*)    Albumin 2.6 (*)    Total Bilirubin 0.2 (*)    All other components within normal limits  CULTURE, BLOOD (ROUTINE X 2)  CULTURE, BLOOD (ROUTINE X 2)  RESP PANEL BY RT-PCR (FLU A&B, COVID) ARPGX2  URINE CULTURE  EXPECTORATED SPUTUM ASSESSMENT W GRAM STAIN, RFLX TO RESP C  MRSA NEXT GEN BY PCR, NASAL  BRAIN NATRIURETIC PEPTIDE  LACTIC ACID, PLASMA  LACTIC ACID, PLASMA  PROTIME-INR  APTT  HIV ANTIBODY (ROUTINE  TESTING W REFLEX)  MAGNESIUM  STREP PNEUMONIAE URINARY ANTIGEN  LEGIONELLA PNEUMOPHILA SEROGP 1 UR AG  TROPONIN I (HIGH SENSITIVITY)  TROPONIN I (HIGH SENSITIVITY)                                                                                                                          Radiology DG Abd 1 View  Result Date: 09/10/2021 CLINICAL DATA:  Lower abdominal pain EXAM: ABDOMEN - 1 VIEW COMPARISON:  08/06/2021 FINDINGS: Postsurgical changes in the lumbar spine are noted. Stimulator pack is seen. Bladder is well distended with contrast opacified urine. Scattered large and small bowel gas is noted. No obstructive changes are seen. No bony abnormality is noted. IMPRESSION: No acute abnormality seen Electronically Signed   By: Alcide Clever M.D.   On: 09/10/2021 03:30   CT Angio Chest PE W and/or Wo Contrast  Result Date: 09/09/2021 CLINICAL DATA:  History of lung cancer status post right upper lobectomy, history of pneumonia, cough, fever for 3 days EXAM: CT ANGIOGRAPHY CHEST WITH CONTRAST TECHNIQUE: Multidetector CT imaging of the chest was performed using the standard protocol during bolus administration of intravenous contrast. Multiplanar CT image reconstructions and MIPs were obtained to evaluate the vascular anatomy. RADIATION DOSE REDUCTION: This exam was performed according to the departmental dose-optimization program which includes automated exposure control, adjustment of the mA and/or kV according to patient size and/or use of iterative reconstruction technique. CONTRAST:  75mL  OMNIPAQUE IOHEXOL 350 MG/ML SOLN COMPARISON:  09/09/2021, 08/03/2021 FINDINGS: Cardiovascular: This is a technically adequate evaluation of the pulmonary vasculature. No filling defects or pulmonary emboli. The heart is unremarkable without pericardial effusion. No evidence of thoracic aortic aneurysm or dissection. Stable atherosclerosis. Mediastinum/Nodes: Chronic mild esophageal wall thickening could be related to  prior radiation therapy. Thyroid and trachea are stable. No pathologic adenopathy. Lungs/Pleura: Postsurgical changes from right upper lobectomy. Stable severe emphysema. Increasing nodular consolidation within the right lower lobe, measuring 1.3 x 1.2 cm reference image 37/7, previously measuring approximately 1.2 x 1.0 cm. Findings favor progressive infection over neoplasm, though continued radiographic follow-up will be needed. Dependent areas of consolidation within the bilateral lower lobes, left greater than right, favor pneumonia over atelectasis. Trace right pleural effusion. Chronic right middle lobe atelectasis. No pneumothorax. Upper Abdomen: No acute abnormality. Musculoskeletal: No acute or destructive bony lesions. Stable spinal stimulator. Reconstructed images demonstrate no additional findings. Review of the MIP images confirms the above findings. IMPRESSION: 1. No evidence of pulmonary embolus. 2. Progressive nodular consolidation within the right lower lobe superior segment, with interval development of patchy dependent bilateral lower lobe airspace disease at the bases. Overall, findings favor progressive multifocal pneumonia, though continued radiographic follow-up would be needed to assess resolution and exclude underlying neoplasm. 3. Trace right pleural effusion. 4. Chronic right middle lobe atelectasis unchanged. 5.  Aortic Atherosclerosis (ICD10-I70.0). Electronically Signed   By: Sharlet Salina M.D.   On: 09/09/2021 21:51   DG Chest 2 View  Result Date: 09/09/2021 CLINICAL DATA:  Evaluate for pneumonia. Shortness of breath. History of lung cancer. EXAM: CHEST - 2 VIEW COMPARISON:  Chest x-ray 08/06/2021.  CT of the chest 08/03/2021 FINDINGS: Multifocal opacities in the right upper lobe and surgical changes appear similar to the prior study. There is no new focal lung infiltrate, pleural effusion or pneumothorax identified. Cardiomediastinal silhouette appears stable. Thoracic spinal cord  stimulator device is again seen. No acute fractures are identified. IMPRESSION: 1.  Stable right upper lobe opacities with postoperative changes. 2.  No new focal lung infiltrate. Electronically Signed   By: Darliss Cheney M.D.   On: 09/09/2021 20:43    Pertinent labs & imaging results that were available during my care of the patient were reviewed by me and considered in my medical decision making (see MDM for details).  Medications Ordered in ED Medications  vancomycin (VANCOCIN) IVPB 1000 mg/200 mL premix (0 mg Intravenous Stopped 09/09/21 2200)  oxyCODONE (OXYCONTIN) 12 hr tablet 15 mg (15 mg Oral Given 09/10/21 1012)  amLODipine (NORVASC) tablet 10 mg (10 mg Oral Given 09/10/21 0818)  atorvastatin (LIPITOR) tablet 40 mg (40 mg Oral Given 09/10/21 0818)  bisoprolol (ZEBETA) tablet 2.5 mg (2.5 mg Oral Given 09/10/21 0819)  mirtazapine (REMERON) tablet 7.5 mg (7.5 mg Oral Given 09/09/21 2256)  traZODone (DESYREL) tablet 150 mg (150 mg Oral Given 09/09/21 2255)  lipase/protease/amylase (CREON) capsule 36,000 Units (36,000 Units Oral Given 09/10/21 1159)  pantoprazole (PROTONIX) EC tablet 40 mg (40 mg Oral Given 09/10/21 0819)  tiZANidine (ZANAFLEX) tablet 4 mg (4 mg Oral Given 09/10/21 1219)  dextromethorphan-guaiFENesin (MUCINEX DM) 30-600 MG per 12 hr tablet 1 tablet (1 tablet Oral Given 09/10/21 0819)  mometasone-formoterol (DULERA) 200-5 MCG/ACT inhaler 2 puff (2 puffs Inhalation Given 09/10/21 0743)  albuterol (PROVENTIL) (2.5 MG/3ML) 0.083% nebulizer solution 2.5 mg (has no administration in time range)  heparin injection 5,000 Units (5,000 Units Subcutaneous Given 09/10/21 1431)  azithromycin (ZITHROMAX) 500 mg in sodium  chloride 0.9 % 250 mL IVPB (0 mg Intravenous Stopped 09/10/21 0010)  diphenhydrAMINE (BENADRYL) injection 25 mg (has no administration in time range)  acetaminophen (TYLENOL) tablet 650 mg (650 mg Oral Given 09/10/21 1219)  oxyCODONE-acetaminophen (PERCOCET) 7.5-325 MG per tablet 1 tablet (1 tablet  Oral Given 09/10/21 1431)  feeding supplement (ENSURE ENLIVE / ENSURE PLUS) liquid 237 mL (237 mLs Oral Given 09/10/21 0826)  cefTRIAXone (ROCEPHIN) 2 g in sodium chloride 0.9 % 100 mL IVPB ( Intravenous Infusion Verify 09/10/21 0526)  umeclidinium bromide (INCRUSE ELLIPTA) 62.5 MCG/ACT 1 puff (1 puff Inhalation Given 09/10/21 0743)  0.9 % NaCl with KCl 40 mEq / L  infusion ( Intravenous New Bag/Given 09/10/21 1019)  ceFEPIme (MAXIPIME) 2 g in sodium chloride 0.9 % 100 mL IVPB (0 g Intravenous Stopped 09/09/21 2050)  iohexol (OMNIPAQUE) 350 MG/ML injection 75 mL (75 mLs Intravenous Contrast Given 09/09/21 2126)                                                                                                                                     Procedures .Critical Care  Performed by: Glendora Score, MD Authorized by: Glendora Score, MD   Critical care provider statement:    Critical care time (minutes):  30   Critical care was necessary to treat or prevent imminent or life-threatening deterioration of the following conditions:  Sepsis   Critical care was time spent personally by me on the following activities:  Development of treatment plan with patient or surrogate, discussions with consultants, evaluation of patient's response to treatment, examination of patient, ordering and review of laboratory studies, ordering and review of radiographic studies, ordering and performing treatments and interventions, pulse oximetry, re-evaluation of patient's condition and review of old charts   (including critical care time)  Medical Decision Making / ED Course   This patient presents to the ED for concern of cough, fever, this involves an extensive number of treatment options, and is a complaint that carries with it a high risk of complications and morbidity.  The differential diagnosis includes pneumonia, sepsis, PE, malignancy recurrence  MDM: Patient seen emergency room for evaluation of cough and fever.   Physical exam reveals rales bilaterally but is otherwise unremarkable.  Laboratory evaluation concerning with a leukocytosis of 22.6 with a neutrophilic predominance hemoglobin 10.9 with normal lactic acid, urinalysis unremarkable, initial chest x-ray with stable upper lobe opacities but given current fever and leukocytosis she meets SIRS criteria and sepsis alert called and patient started on broad-spectrum antibiotics.  Given normal lactic acid, aggressive fluid resuscitation not begun.  Follow-up CT PE obtained that was reassuringly negative for PE but does show worsening multifocal pneumonia.  Patient then admitted for sepsis with concern for source as multifocal pneumonia.   Additional history obtained:  -External records from outside source obtained and reviewed including: Chart review including previous notes, labs, imaging, consultation notes   Lab Tests: -I  ordered, reviewed, and interpreted labs.   The pertinent results include:   Labs Reviewed  COMPREHENSIVE METABOLIC PANEL - Abnormal; Notable for the following components:      Result Value   Sodium 134 (*)    Glucose, Bld 133 (*)    Creatinine, Ser 0.43 (*)    Albumin 3.2 (*)    All other components within normal limits  CBC WITH DIFFERENTIAL/PLATELET - Abnormal; Notable for the following components:   WBC 22.6 (*)    RBC 3.26 (*)    Hemoglobin 10.9 (*)    HCT 34.9 (*)    MCV 107.1 (*)    Neutro Abs 19.3 (*)    Monocytes Absolute 1.4 (*)    Abs Immature Granulocytes 0.18 (*)    All other components within normal limits  URINALYSIS, ROUTINE W REFLEX MICROSCOPIC - Abnormal; Notable for the following components:   Specific Gravity, Urine 1.040 (*)    Protein, ur 30 (*)    All other components within normal limits  CBC WITH DIFFERENTIAL/PLATELET - Abnormal; Notable for the following components:   WBC 12.5 (*)    RBC 3.00 (*)    Hemoglobin 9.9 (*)    HCT 32.8 (*)    MCV 109.3 (*)    Neutro Abs 9.6 (*)    Abs Immature  Granulocytes 0.08 (*)    All other components within normal limits  COMPREHENSIVE METABOLIC PANEL - Abnormal; Notable for the following components:   Potassium 3.3 (*)    Glucose, Bld 118 (*)    Creatinine, Ser 0.37 (*)    Total Protein 6.0 (*)    Albumin 2.6 (*)    Total Bilirubin 0.2 (*)    All other components within normal limits  CULTURE, BLOOD (ROUTINE X 2)  CULTURE, BLOOD (ROUTINE X 2)  RESP PANEL BY RT-PCR (FLU A&B, COVID) ARPGX2  URINE CULTURE  EXPECTORATED SPUTUM ASSESSMENT W GRAM STAIN, RFLX TO RESP C  MRSA NEXT GEN BY PCR, NASAL  BRAIN NATRIURETIC PEPTIDE  LACTIC ACID, PLASMA  LACTIC ACID, PLASMA  PROTIME-INR  APTT  HIV ANTIBODY (ROUTINE TESTING W REFLEX)  MAGNESIUM  STREP PNEUMONIAE URINARY ANTIGEN  LEGIONELLA PNEUMOPHILA SEROGP 1 UR AG  TROPONIN I (HIGH SENSITIVITY)  TROPONIN I (HIGH SENSITIVITY)      EKG   EKG Interpretation  Date/Time:  Friday September 09 2021 20:02:21 EDT Ventricular Rate:  100 PR Interval:  165 QRS Duration: 74 QT Interval:  334 QTC Calculation: 431 R Axis:   45 Text Interpretation: Sinus tachycardia Consider left ventricular hypertrophy similar to prior No STEMI Confirmed by Theda Belfast ([id number]) on 09/10/2021 11:55:34 AM         Imaging Studies ordered: I ordered imaging studies including chest x-ray, CT PE I independently visualized and interpreted imaging. I agree with the radiologist interpretation   Medicines ordered and prescription drug management: Meds ordered this encounter  Medications   ceFEPIme (MAXIPIME) 2 g in sodium chloride 0.9 % 100 mL IVPB   vancomycin (VANCOCIN) IVPB 1000 mg/200 mL premix    Order Specific Question:   Indication:    Answer:   Sepsis   iohexol (OMNIPAQUE) 350 MG/ML injection 75 mL   oxyCODONE (OXYCONTIN) 12 hr tablet 15 mg   amLODipine (NORVASC) tablet 10 mg   atorvastatin (LIPITOR) tablet 40 mg   bisoprolol (ZEBETA) tablet 2.5 mg    Take in Place of Metoprolol     mirtazapine (REMERON)  tablet 7.5 mg    Cross taper with 30mg   of Cymbalta for 3-4 weeks. Then stop cymbalta     traZODone (DESYREL) tablet 150 mg   lipase/protease/amylase (CREON) capsule 36,000 Units   pantoprazole (PROTONIX) EC tablet 40 mg   tiZANidine (ZANAFLEX) tablet 4 mg   dextromethorphan-guaiFENesin (MUCINEX DM) 30-600 MG per 12 hr tablet 1 tablet   mometasone-formoterol (DULERA) 200-5 MCG/ACT inhaler 2 puff   albuterol (PROVENTIL) (2.5 MG/3ML) 0.083% nebulizer solution 2.5 mg   DISCONTD: Tiotropium Bromide Monohydrate AERS 2 puff   heparin injection 5,000 Units   DISCONTD: cefTRIAXone (ROCEPHIN) 2 g in sodium chloride 0.9 % 100 mL IVPB    Order Specific Question:   Antibiotic Indication:    Answer:   CAP   azithromycin (ZITHROMAX) 500 mg in sodium chloride 0.9 % 250 mL IVPB    Order Specific Question:   Antibiotic Indication:    Answer:   CAP   diphenhydrAMINE (BENADRYL) injection 25 mg   acetaminophen (TYLENOL) tablet 650 mg   oxyCODONE-acetaminophen (PERCOCET) 7.5-325 MG per tablet 1 tablet   feeding supplement (ENSURE ENLIVE / ENSURE PLUS) liquid 237 mL   cefTRIAXone (ROCEPHIN) 2 g in sodium chloride 0.9 % 100 mL IVPB    Order Specific Question:   Antibiotic Indication:    Answer:   CAP   umeclidinium bromide (INCRUSE ELLIPTA) 62.5 MCG/ACT 1 puff   DISCONTD: sodium chloride 0.9 % 1,000 mL with potassium chloride 40 mEq infusion   0.9 % NaCl with KCl 40 mEq / L  infusion    -I have reviewed the patients home medicines and have made adjustments as needed  Critical interventions Multiple antibiotics, sepsis work-up   Cardiac Monitoring: The patient was maintained on a cardiac monitor.  I personally viewed and interpreted the cardiac monitored which showed an underlying rhythm of: Sinus tachycardia, NSR  Social Determinants of Health:  Factors impacting patients care include: none   Reevaluation: After the interventions noted above, I reevaluated the patient and found that they have  :improved  Co morbidities that complicate the patient evaluation  Past Medical History:  Diagnosis Date   Allergy    Anemia    Anxiety    Arthritis    Asthma    Carpal tunnel syndrome    Chronic back pain    Cigarette smoker 08/25/2017   Complication of anesthesia    in the past has had N/V, the last few surgeries she has not been   COPD (chronic obstructive pulmonary disease) (HCC)    Easy bruising 07/10/2016   GERD (gastroesophageal reflux disease)    Headache    Heart murmur 2005   never had any problems   History of blood transfusion 2018   History of kidney stones    Hypertension    Hypoglycemia    Hypoglycemia    Iron deficiency anemia 07/11/2016   Neuropathy    both arms   Normocytic anemia 07/29/2015   Pancreatitis    Pneumonia    PONV (postoperative nausea and vomiting)    Postoperative anemia due to acute blood loss 11/15/2015   R lung cancer    Sciatica    Seizures (HCC)    as a child when she would have an asthma attack. none as an adult   Shortness of breath dyspnea       Dispostion: I considered admission for this patient, and given worsening multifocal pneumonia concern for sepsis patient will be admitted.     Final Clinical Impression(s) / ED Diagnoses Final diagnoses:  Community acquired pneumonia, unspecified laterality     @  Charlyne Petrin, MD 09/10/21 [phone]

## 2021-09-09 NOTE — Assessment & Plan Note (Signed)
-  Continue Norvasc and Zebeta -heart healthy diet discussed with patient -Reassess blood pressure at follow-up visit and further adjust antihypertensive regimen as needed.

## 2021-09-09 NOTE — Progress Notes (Signed)
Pharmacy Antibiotic Note  Jasmine [name] is a 66 y.o. female admitted on 09/09/2021 with sepsis.  Pharmacy has been consulted for vancomycin dosing.  No current creatinine available, most recent Scr (08/2021) 0.31. Will dose with 0.8 for now.  Plan: Vancomycin 1000 mg IV Q36h (eAUC 520, goal AUC 400-550, Scr unknown (bsl ~0.3-0.5), Vd 0.72)  Trend WBC, fever, renal function F/u cultures, clinical progress, levels as indicated De-escalate when able   Height: 5\' 3"  (160 cm) Weight: 42.5 kg (93 lb 11.1 oz) IBW/kg (Calculated) : 52.4  Temp (24hrs), Avg:101 F (38.3 C), Min:101 F (38.3 C), Max:101 F (38.3 C)  Recent Labs  Lab 09/09/21 1941 09/09/21 1953  WBC 22.6*  --   LATICACIDVEN  --  0.9    CrCl cannot be calculated (Patient's most recent lab result is older than the maximum 21 days allowed.).    Allergies  Allergen Reactions   Lyrica [Pregabalin] Other (See Comments)    EXTREME SHAKING/TREMBLING   Darvon [Propoxyphene]     UNSPECIFIED REACTION    Gabapentin     Extreme shaking and trembling    Augmentin [Amoxicillin-Pot Clavulanate] Nausea And Vomiting    Has patient had a PCN reaction causing immediate rash, facial/tongue/throat swelling, SOB or lightheadedness with hypotension:no Has patient had a PCN reaction causing severe rash involving mucus membranes or skin necrosis:No Has patient had a PCN reaction that required hospitalization:No Has patient had a PCN reaction occurring within the last 10 years:Yes--ONLY N/V If all of the above answers are "NO", then may proceed with Cephalosporin use.    Erythromycin Itching and Rash        Vibramycin [Doxycycline Calcium] Itching and Rash    "[place] "     Microbiology results: 7/07 BCx: pending   Thank you for allowing pharmacy to be a part of this patient's care.  Ardyth Harps, PharmD Clinical Pharmacist

## 2021-09-09 NOTE — ED Triage Notes (Signed)
Pt brought in by [facility] from home for SOB, pt has hx of lung CA with R upper lobectomy, pt told EMS she had PNA about a month ago and doesn't think she really has ever gotten over it. Pt temp at home was 103.6, pt took tylenol, temp now is 101. Vitals per EMS- HR 104 NSR, 96% on 5L (continuous O2 at home) BP - 132/80. pt reports generalized body aches, cough, and fever x 3 days

## 2021-09-09 NOTE — Assessment & Plan Note (Signed)
-  Currently on baseline 5 L nasal cannula -Chronic respiratory failure secondary to COPD and history of lung cancer -PNA worsening breathing.  -Continue to monitor

## 2021-09-10 ENCOUNTER — Inpatient Hospital Stay (HOSPITAL_COMMUNITY): Payer: Medicare Other

## 2021-09-10 DIAGNOSIS — R1030 Lower abdominal pain, unspecified: Secondary | ICD-10-CM

## 2021-09-10 DIAGNOSIS — E785 Hyperlipidemia, unspecified: Secondary | ICD-10-CM

## 2021-09-10 DIAGNOSIS — K861 Other chronic pancreatitis: Secondary | ICD-10-CM

## 2021-09-10 DIAGNOSIS — E440 Moderate protein-calorie malnutrition: Secondary | ICD-10-CM

## 2021-09-10 DIAGNOSIS — J449 Chronic obstructive pulmonary disease, unspecified: Secondary | ICD-10-CM

## 2021-09-10 DIAGNOSIS — J9611 Chronic respiratory failure with hypoxia: Secondary | ICD-10-CM | POA: Diagnosis not present

## 2021-09-10 DIAGNOSIS — J189 Pneumonia, unspecified organism: Secondary | ICD-10-CM | POA: Diagnosis not present

## 2021-09-10 DIAGNOSIS — K219 Gastro-esophageal reflux disease without esophagitis: Secondary | ICD-10-CM

## 2021-09-10 DIAGNOSIS — R6510 Systemic inflammatory response syndrome (SIRS) of non-infectious origin without acute organ dysfunction: Secondary | ICD-10-CM

## 2021-09-10 DIAGNOSIS — I10 Essential (primary) hypertension: Secondary | ICD-10-CM

## 2021-09-10 LAB — URINALYSIS, ROUTINE W REFLEX MICROSCOPIC
Bacteria, UA: NONE SEEN
Bilirubin Urine: NEGATIVE
Glucose, UA: NEGATIVE mg/dL
Hgb urine dipstick: NEGATIVE
Ketones, ur: NEGATIVE mg/dL
Leukocytes,Ua: NEGATIVE
Nitrite: NEGATIVE
Protein, ur: 30 mg/dL — AB
Specific Gravity, Urine: 1.04 — ABNORMAL HIGH (ref 1.005–1.030)
pH: 7 (ref 5.0–8.0)

## 2021-09-10 LAB — COMPREHENSIVE METABOLIC PANEL
ALT: 18 U/L (ref 0–44)
AST: 16 U/L (ref 15–41)
Albumin: 2.6 g/dL — ABNORMAL LOW (ref 3.5–5.0)
Alkaline Phosphatase: 85 U/L (ref 38–126)
Anion gap: 5 (ref 5–15)
BUN: 13 mg/dL (ref 8–23)
CO2: 28 mmol/L (ref 22–32)
Calcium: 8.9 mg/dL (ref 8.9–10.3)
Chloride: 106 mmol/L (ref 98–111)
Creatinine, Ser: 0.37 mg/dL — ABNORMAL LOW (ref 0.44–1.00)
GFR, Estimated: >60 mL/min (ref >60)
Glucose, Bld: 118 mg/dL — ABNORMAL HIGH (ref 70–99)
Potassium: 3.3 mmol/L — ABNORMAL LOW (ref 3.5–5.1)
Sodium: 139 mmol/L (ref 135–145)
Total Bilirubin: 0.2 mg/dL — ABNORMAL LOW (ref 0.3–1.2)
Total Protein: 6 g/dL — ABNORMAL LOW (ref 6.5–8.1)

## 2021-09-10 LAB — CBC WITH DIFFERENTIAL/PLATELET
Abs Immature Granulocytes: 0.08 10*3/uL — ABNORMAL HIGH (ref 0.00–0.07)
Basophils Absolute: 0.1 10*3/uL (ref 0.0–0.1)
Basophils Relative: 0 %
Eosinophils Absolute: 0.1 10*3/uL (ref 0.0–0.5)
Eosinophils Relative: 1 %
HCT: 32.8 % — ABNORMAL LOW (ref 36.0–46.0)
Hemoglobin: 9.9 g/dL — ABNORMAL LOW (ref 12.0–15.0)
Immature Granulocytes: 1 %
Lymphocytes Relative: 15 %
Lymphs Abs: 1.9 10*3/uL (ref 0.7–4.0)
MCH: 33 pg (ref 26.0–34.0)
MCHC: 30.2 g/dL (ref 30.0–36.0)
MCV: 109.3 fL — ABNORMAL HIGH (ref 80.0–100.0)
Monocytes Absolute: 0.9 10*3/uL (ref 0.1–1.0)
Monocytes Relative: 7 %
Neutro Abs: 9.6 10*3/uL — ABNORMAL HIGH (ref 1.7–7.7)
Neutrophils Relative %: 76 %
Platelets: 288 10*3/uL (ref 150–400)
RBC: 3 MIL/uL — ABNORMAL LOW (ref 3.87–5.11)
RDW: 13.7 % (ref 11.5–15.5)
WBC: 12.5 10*3/uL — ABNORMAL HIGH (ref 4.0–10.5)
nRBC: 0 % (ref 0.0–0.2)

## 2021-09-10 LAB — HIV ANTIBODY (ROUTINE TESTING W REFLEX): HIV Screen 4th Generation wRfx: NONREACTIVE

## 2021-09-10 LAB — MRSA NEXT GEN BY PCR, NASAL: MRSA by PCR Next Gen: NOT DETECTED

## 2021-09-10 LAB — MAGNESIUM: Magnesium: 1.8 mg/dL (ref 1.7–2.4)

## 2021-09-10 LAB — STREP PNEUMONIAE URINARY ANTIGEN: Strep Pneumo Urinary Antigen: NEGATIVE

## 2021-09-10 MED ORDER — POTASSIUM CHLORIDE 2 MEQ/ML IV SOLN
INTRAVENOUS | Status: DC
Start: 2021-09-10 — End: 2021-09-10

## 2021-09-10 MED ORDER — POTASSIUM CHLORIDE IN NACL 40-0.9 MEQ/L-% IV SOLN
INTRAVENOUS | Status: DC
  Administered 2021-09-11: 75 mL/h via INTRAVENOUS

## 2021-09-10 MED ORDER — OXYCODONE-ACETAMINOPHEN 7.5-325 MG PO TABS
1.0000 | ORAL_TABLET | ORAL | Status: DC | PRN
  Administered 2021-09-10 – 2021-09-13 (×16): 1 via ORAL
  Filled 2021-09-10 (×16): qty 1

## 2021-09-10 NOTE — Progress Notes (Signed)
Patient seen and examined; admitted after midnight secondary to SOB, non-productive coughing spells and fever. Patient at baseline with chronic resp failure (requiring 4-5L Tamora supplementation) due to COPD and lung cancer. Symptoms has been present for the last 36 hours or so and failed to improved with the use of tylenol and home bronchodilators.  Patient expressed associated general malaise and weakness. Work up in ED demonstrating multifocal PNA and patient has met criteria for sepsis with elevated HR, RR, leukocytosis and fever; source of infection PNA. Please refer to H7P written by Dr. Clearence Ped for further info/details on admission.  Plan: -continue IV antibiotics -follow culture results -PRN analgesics and antipyretics  -start flutter valve and use mucolytics. -fluid resuscitation and electrolyte repletion. -follow clinical response.   Barton Dubois MD [phone]

## 2021-09-10 NOTE — H&P (Signed)
History and Physical    Patient: Jasmine [name]:[id number] DOB: [birth year] DOA: 09/09/2021 DOS: the patient was seen and examined on 09/10/2021 PCP: Janora Norlander, DO  Patient coming from: Home  Chief Complaint:  Chief Complaint  Patient presents with   Shortness of Breath    Hx lung CA -R upper lobectomy   HPI: Jasmine [name] is a 66 y.o. female with medical history significant of allergies, anemia, COPD, former smoker, GERD, hypertension, right lung cancer, and more presents the ED with a chief complaint of fever.  Patient reports daily has been started on morning of the seventh.  She reports a Tmax of 104.  She took Tylenol which made the temperature come down but did not improve the way she felt.  She had a cough and has been nonproductive.  The cough started at the same time.  She has associated dyspnea but has not required more than her 4 L nasal cannula baseline.  She had associated generalized weakness and fatigue.  She has had no sick contacts.  She denies any chest pain.  She denies any recent nausea/vomiting.  She is vaccinated for COVID.  Patient has a history of right lung cancer, with port still in place but no longer gets chemo.  She has no other complaints at this time.  Former smoker, does not drink alcohol, does not use illicit drugs.  Patient is full code. Review of Systems: As mentioned in the history of present illness. All other systems reviewed and are negative. Past Medical History:  Diagnosis Date   Allergy    Anemia    Anxiety    Arthritis    Asthma    Carpal tunnel syndrome    Chronic back pain    Cigarette smoker 04/05/8655   Complication of anesthesia    in the past has had N/V, the last few surgeries she has not been   COPD (chronic obstructive pulmonary disease) ([place])    Easy bruising 07/10/2016   GERD (gastroesophageal reflux disease)    Headache    Heart murmur 2005   never had any problems   History of blood transfusion 2018   History of  kidney stones    Hypertension    Hypoglycemia    Hypoglycemia    Iron deficiency anemia 07/11/2016   Neuropathy    both arms   Normocytic anemia 07/29/2015   Pancreatitis    Pneumonia    PONV (postoperative nausea and vomiting)    Postoperative anemia due to acute blood loss 11/15/2015   R lung cancer    Sciatica    Seizures ([place])    as a child when she would have an asthma attack. none as an adult   Shortness of breath dyspnea    Past Surgical History:  Procedure Laterality Date   ABDOMINAL HYSTERECTOMY  8469   APPLICATION OF ROBOTIC ASSISTANCE FOR SPINAL PROCEDURE N/A 09/05/2016   Procedure: APPLICATION OF ROBOTIC ASSISTANCE FOR SPINAL PROCEDURE;  Surgeon: Consuella Lose, MD;  Location: [place];  Service: Neurosurgery;  Laterality: N/A;   BACK SURGERY     BIOPSY  06/14/2020   Procedure: BIOPSY;  Surgeon: Daneil Dolin, MD;  [facility];  Service: Endoscopy;;   [place]   COLONOSCOPY     unsure last   COLONOSCOPY WITH PROPOFOL N/A 06/14/2020   Procedure: COLONOSCOPY WITH PROPOFOL;  Surgeon: Daneil Dolin, MD;  [facility];  Service:  Endoscopy;  Laterality: N/A;  PM   cyst removal face     disk repair     neck and lumbar region   ESOPHAGOGASTRODUODENOSCOPY (EGD) WITH PROPOFOL N/A 06/14/2020   Procedure: ESOPHAGOGASTRODUODENOSCOPY (EGD) WITH PROPOFOL;  Surgeon: Daneil Dolin, MD;  [facility];  Service: Endoscopy;  Laterality: N/A;   FOOT SURGERY Bilateral    to file bones down    HARDWARE REMOVAL N/A 09/05/2016   Procedure: HARDWARE REMOVAL AND REPLACEMENT OF LUMBAR FIVE SCREWS;  Surgeon: Consuella Lose, MD;  Location: [place];  Service: Neurosurgery;  Laterality: N/A;   IMPLANTATION / PLACEMENT EPIDURAL NEUROSTIMULATOR ELECTRODES     INTERCOSTAL NERVE BLOCK Right 07/29/2020   Procedure: INTERCOSTAL NERVE BLOCK;  Surgeon: Melrose Nakayama, MD;  Location: [place];  Service: Thoracic;  Laterality:  Right;   LAMINECTOMY     2006   lobectomy Right 07/2020   LUMBAR FUSION     LYMPH NODE BIOPSY Right 07/29/2020   Procedure: LYMPH NODE BIOPSY;  Surgeon: Melrose Nakayama, MD;  Location: [place];  Service: Thoracic;  Laterality: Right;   POLYPECTOMY  06/14/2020   Procedure: POLYPECTOMY INTESTINAL;  Surgeon: Daneil Dolin, MD;  [facility];  Service: Endoscopy;;   SPINAL CORD STIMULATOR BATTERY EXCHANGE     SPINAL CORD STIMULATOR INSERTION N/A 07/05/2016   Procedure: REPLACEMENT OF LUMBAR SPINAL CORD STIMULATOR BATTERY;  Surgeon: Newman Pies, MD;  Location: [place];  Service: Neurosurgery;  Laterality: N/A;   TONSILLECTOMY     Social History:  reports that she has been smoking cigarettes. She has a 8.75 pack-year smoking history. She has never used smokeless tobacco. She reports current alcohol use of about 2.0 standard drinks of alcohol per week. She reports that she does not use drugs.  Allergies  Allergen Reactions   Lyrica [Pregabalin] Other (See Comments)    EXTREME SHAKING/TREMBLING   Darvon [Propoxyphene]     UNSPECIFIED REACTION    Gabapentin     Extreme shaking and trembling    Augmentin [Amoxicillin-Pot Clavulanate] Nausea And Vomiting    Has patient had a PCN reaction causing immediate rash, facial/tongue/throat swelling, SOB or lightheadedness with hypotension:no Has patient had a PCN reaction causing severe rash involving mucus membranes or skin necrosis:No Has patient had a PCN reaction that required hospitalization:No Has patient had a PCN reaction occurring within the last 10 years:Yes--ONLY N/V If all of the above answers are "NO", then may proceed with Cephalosporin use.    Erythromycin Itching and Rash        Vibramycin [Doxycycline Calcium] Itching and Rash    "[place] "    Family History  Problem Relation Age of Onset   Heart disease Mother    Diabetes Mother    Hypertension Mother    Hyperlipidemia Mother    COPD Mother         smoked   Cancer Mother        bile duct   COPD Father    Diabetes Father    Heart disease Father    Cancer Father        throat   Dementia Father    Asthma Son    Hypertension Sister    Hyperlipidemia Sister    Hypertension Brother    Hyperlipidemia Brother    Hypertension Brother    Hyperlipidemia Brother    Emphysema Maternal Grandmother        smoked   Colon cancer Neg Hx    Colon  polyps Neg Hx     Prior to Admission medications   Medication Sig Start Date End Date Taking? Authorizing Provider  albuterol (VENTOLIN HFA) 108 (90 Base) MCG/ACT inhaler Inhale 2 puffs into the lungs every 6 (six) hours as needed for wheezing or shortness of breath. 08/26/21   Dettinger, Fransisca Kaufmann, MD  albuterol (VENTOLIN HFA) 108 (90 Base) MCG/ACT inhaler Inhale 2 puffs into the lungs every 6 (six) hours as needed for wheezing or shortness of breath. 08/26/21   Ronnie Doss M, DO  amLODipine (NORVASC) 10 MG tablet Take 1 tablet (10 mg total) by mouth daily. 08/07/21   Roxan Hockey, MD  atorvastatin (LIPITOR) 40 MG tablet Take 1 tablet (40 mg total) by mouth daily. 07/09/20   Janora Norlander, DO  bisoprolol (ZEBETA) 5 MG tablet Take 0.5 tablets (2.5 mg total) by mouth daily. Take in Place of Metoprolol 08/07/21   Roxan Hockey, MD  Cholecalciferol 125 MCG (5000 UT) capsule Take 5,000 Units by mouth daily.    [provider]  dextromethorphan (DELSYM) 30 MG/5ML liquid Take 15 mg by mouth as needed for cough.    [provider]  dextromethorphan-guaiFENesin (MUCINEX DM) 30-600 MG 12hr tablet Take 1 tablet by mouth 2 (two) times daily.    [provider]  fluticasone-salmeterol (ADVAIR DISKUS) 250-50 MCG/ACT AEPB Inhale 1 puff into the lungs in the morning and at bedtime. 08/07/21   Roxan Hockey, MD  lipase/protease/amylase 24000-76000 units CPEP Take 2 caps daily with meals and 1 cap with snacks 07/03/21   Johnson, Clanford L, MD  mirtazapine (REMERON) 7.5 MG tablet  Take 1 tablet (7.5 mg total) by mouth at bedtime. Cross taper with 30mg  of Cymbalta for 3-4 weeks. Then stop cymbalta 07/11/21   Ronnie Doss M, DO  Nutritional Supplements (ENSURE HIGH PROTEIN) LIQD Drink 1 can 3 times daily. 08/30/20   Janora Norlander, DO  omeprazole (PRILOSEC) 40 MG capsule Take one capsule once to twice daily before a meal. 01/18/21   Mahala Menghini, PA-C  oxybutynin (DITROPAN-XL) 5 MG 24 hr tablet TAKE 1 TABLET AT BEDTIME 09/05/21   Ronnie Doss M, DO  oxyCODONE-acetaminophen (PERCOCET) 10-325 MG tablet Take 1 tablet by mouth 5 (five) times daily. 08/27/20   [provider]  OXYCONTIN 15 MG 12 hr tablet Take 15 mg by mouth every 12 (twelve) hours. 08/28/20   [provider]  OXYGEN Inhale 4 L into the lungs See admin instructions. continuous    [provider]  polyethylene glycol (MIRALAX) 17 g packet Take 17 g by mouth daily. 08/07/21   Roxan Hockey, MD  prochlorperazine (COMPAZINE) 25 MG suppository Place 1 suppository (25 mg total) rectally every 12 (twelve) hours as needed for nausea or vomiting. 08/07/21   Roxan Hockey, MD  Tiotropium Bromide Monohydrate (SPIRIVA RESPIMAT) 2.5 MCG/ACT AERS Inhale 2 puffs into the lungs daily. 08/07/21   Roxan Hockey, MD  tizanidine (ZANAFLEX) 4 MG capsule Take 1 capsule (4 mg total) by mouth 3 (three) times daily as needed for muscle spasms. 08/07/21   Roxan Hockey, MD  traZODone (DESYREL) 150 MG tablet TAKE 1 TO 2 TABLETS AT BEDTIME Patient taking differently: Take 150 mg by mouth at bedtime. 03/30/21   Janora Norlander, DO    Physical Exam: Vitals:   09/09/21 2300 09/09/21 2330 09/10/21 0000 09/10/21 0037  BP: (!) 144/80 137/81 124/68 138/73  Pulse: 96 95 93 94  Resp: 13 (!) 23 18 18   Temp:  98.5 F (36.9 C)  TempSrc:      SpO2: 96% 100% 100% 100%  Weight:    42.3 kg  Height:    5\' 3"  (1.6 m)   1.  General: Patient lying supine in bed,  no acute distress   2.  Psychiatric: Somnolent-however she has been up all night-and oriented x 3, mood and behavior normal for situation, pleasant and cooperative with exam   3. Neurologic: Speech and language are normal, face is symmetric, moves all 4 extremities voluntarily, at baseline without acute deficits on limited exam   4. HEENMT:  Head is atraumatic, normocephalic, pupils reactive to light, neck is supple, trachea is midline, mucous membranes are moist   5. Respiratory : Lungs are clear to auscultation bilaterally without wheezing, rhonchi, rales, no cyanosis, no increase in work of breathing or accessory muscle use   6. Cardiovascular : Heart rate normal, rhythm is regular, murmur present, rubs or gallops, no peripheral edema, peripheral pulses palpated   7. Gastrointestinal:  Abdomen is soft, slightly distended, to palpation in the abdomen in the bilateral lower quadrant with voluntary guarding, bowel sounds active, no masses or organomegaly palpated   8. Skin:  Skin is warm, dry and intact without rashes, acute lesions, or ulcers on limited exam   9.Musculoskeletal:  No acute deformities or trauma, no asymmetry in tone, no peripheral edema, peripheral pulses palpated, no tenderness to palpation in the extremities  Data Reviewed: In the ED Temp 101, heart rate 71-1 01, respiratory 14-22, blood pressure 126/68-139/78, satting at 100% on 5 L nasal cannula Leukocytosis 22.6, hemoglobin 10.9, platelets 314 Chemistry is unremarkable aside from an albumin of 3.2 CTA shows no PE, progressive nodular consolidation within the right lower lobe and other findings are consistent with multifocal pneumonia.  Trace right pleural effusion as well EKG shows a heart rate of 100, sinus tach, QTc 431 Blood cultures pending Trope 10, BNP 79, lactic acid 0.9x2 Admission requested for further management of pneumonia COVID-negative  Potentially immunocompromised patient with history of lung cancer and  chemo-suspected remission  Assessment and Plan: * Pneumonia - Pneumonia finding on CT -Meet SIRS criteria with white blood cell count 22.6, temperature 101, heart rate 91, respiratory rate 22 -No acute endorgan damage -Expectorated sputum culture -Urine strep and Legionella antigens ordered -Initially given cefepime and vancomycin -For pneumonia order set continue Rocephin and Zithromax -Blood cultures pending -Continue to monitor  Hyperlipidemia -Continue statin  Chronic pancreatitis (HCC) - Continue supplementation of pancreatic enzymes with meals  SIRS (systemic inflammatory response syndrome) (HCC) - This is 22.6, febrile to 101, tachycardia 101, tachypnea 22 -Cute endorgan damage -For pneumonia  Abdominal pain - Denies any nausea/vomiting/diarrhea - Denies any dysuria, hematuria - Pain is worse with palpation, better with rest - KUB ordered - Continue pain scale  GERD (gastroesophageal reflux disease) - Continue PPI  Chronic respiratory failure with hypoxia (HCC) - Currently on baseline 5 L nasal cannula -Chronic respiratory failure secondary to COPD and history of lung cancer -Today, pneumonia contributing, but still at baseline oxygen requirement -Continue albuterol as needed -Continue to monitor  Malnutrition of moderate degree - 3.2 -BMI 16.60 -Encourage nutrient dense food choices -Start protein shakes with meals  COPD GOLD II  - Continue Dulera -Continue as needed albuterol -Continue Spiriva -Continue baseline oxygen supplementation -You to monitor  HTN (hypertension) - Continue Norvasc and Zebeta      Advance Care Planning:   Code Status: Full Code   Consults: None  Family  Communication: No family at bedside  Severity of Illness: The appropriate patient status for this patient is INPATIENT. Inpatient status is judged to be reasonable and necessary in order to provide the required intensity of service to ensure the patient's safety. The  patient's presenting symptoms, physical exam findings, and initial radiographic and laboratory data in the context of their chronic comorbidities is felt to place them at high risk for further clinical deterioration. Furthermore, it is not anticipated that the patient will be medically stable for discharge from the hospital within 2 midnights of admission.   * I certify that at the point of admission it is my clinical judgment that the patient will require inpatient hospital care spanning beyond 2 midnights from the point of admission due to high intensity of service, high risk for further deterioration and high frequency of surveillance required.*  Author: Rolla Plate, DO 09/10/2021 3:38 AM  For on call review [email or web address].

## 2021-09-10 NOTE — Assessment & Plan Note (Addendum)
-  Denies any nausea/vomiting/diarrhea -most likely from coughing spells -continue PRN analgesics -continue antitussives and mucolytic meds.

## 2021-09-11 DIAGNOSIS — K219 Gastro-esophageal reflux disease without esophagitis: Secondary | ICD-10-CM | POA: Diagnosis not present

## 2021-09-11 DIAGNOSIS — J189 Pneumonia, unspecified organism: Secondary | ICD-10-CM | POA: Diagnosis not present

## 2021-09-11 DIAGNOSIS — A419 Sepsis, unspecified organism: Secondary | ICD-10-CM

## 2021-09-11 DIAGNOSIS — K861 Other chronic pancreatitis: Secondary | ICD-10-CM | POA: Diagnosis not present

## 2021-09-11 DIAGNOSIS — J9611 Chronic respiratory failure with hypoxia: Secondary | ICD-10-CM | POA: Diagnosis not present

## 2021-09-11 LAB — URINE CULTURE: Culture: NO GROWTH

## 2021-09-11 LAB — BASIC METABOLIC PANEL
Anion gap: 6 (ref 5–15)
BUN: 14 mg/dL (ref 8–23)
CO2: 27 mmol/L (ref 22–32)
Calcium: 8.6 mg/dL — ABNORMAL LOW (ref 8.9–10.3)
Chloride: 108 mmol/L (ref 98–111)
Creatinine, Ser: 0.32 mg/dL — ABNORMAL LOW (ref 0.44–1.00)
GFR, Estimated: >60 mL/min (ref >60)
Glucose, Bld: 121 mg/dL — ABNORMAL HIGH (ref 70–99)
Potassium: 4.8 mmol/L (ref 3.5–5.1)
Sodium: 141 mmol/L (ref 135–145)

## 2021-09-11 LAB — CBC
HCT: 33.1 % — ABNORMAL LOW (ref 36.0–46.0)
Hemoglobin: 10 g/dL — ABNORMAL LOW (ref 12.0–15.0)
MCH: 33.7 pg (ref 26.0–34.0)
MCHC: 30.2 g/dL (ref 30.0–36.0)
MCV: 111.4 fL — ABNORMAL HIGH (ref 80.0–100.0)
Platelets: 283 10*3/uL (ref 150–400)
RBC: 2.97 MIL/uL — ABNORMAL LOW (ref 3.87–5.11)
RDW: 13.7 % (ref 11.5–15.5)
WBC: 5.8 10*3/uL (ref 4.0–10.5)
nRBC: 0 % (ref 0.0–0.2)

## 2021-09-11 MED ORDER — GLYCOPYRROLATE 0.2 MG/ML IJ SOLN: 0.1000 mg | Freq: Three times a day (TID) | INTRAMUSCULAR | Status: DC | PRN

## 2021-09-11 NOTE — Progress Notes (Addendum)
Progress Note   Patient: Jasmine [name] WJX:[id number] DOB: 03-Nov-1955 DOA: 09/09/2021     2 DOS: the patient was seen and examined on 09/11/2021   Brief hospital admission course: H&P written on 09/10/2021 by Dr. Idolina Primer is a 66 y.o. female with medical history significant of allergies, anemia, COPD, former smoker, GERD, hypertension, right lung cancer, and more presents the ED with a chief complaint of fever.  Patient reports daily has been started on morning of the seventh.  She reports a Tmax of 104.  She took Tylenol which made the temperature come down but did not improve the way she felt.  She had a cough and has been nonproductive.  The cough started at the same time.  She has associated dyspnea but has not required more than her 4 L nasal cannula baseline.  She had associated generalized weakness and fatigue.  She has had no sick contacts.  She denies any chest pain.  She denies any recent nausea/vomiting.  She is vaccinated for COVID.  Patient has a history of right lung cancer, with port still in place but no longer gets chemo.  She has no other complaints at this time.  Former smoker, does not drink alcohol, does not use illicit drugs.  Patient is full code.  Assessment and Plan: * Community acquired pneumonia -Pneumonia finding appreciated on CT -Feeling much improved and expressing just mild Short winded sensation with activity. -Continue to have intermittent productive coughing spells. -patient met sepsis criteria on admission with fever, leukocytosis and tachypnea. -Overall feels ready to go home with oral antibiotics and outpatient follow-up with PCP. -Sepsis features resolved at this moment. -Cefdinir and Zithromax has been prescribed to complete antibiotic therapy. -Continue bronchodilator management and the use of mucolytic's.   Sepsis ([place]) -Sepsis ruled in at time of admission secondary to multifocal pneumonia. -Patient criteria met with elevated  temperature, tachycardia, tachypnea and leukocytosis. -Continue fluid resuscitation, supportive care and IV antibiotics.  Hyperlipidemia -Continue statin  Chronic pancreatitis (HCC) -Continue supplementation of pancreatic enzymes with meals -continue chronic pain meds. -Continue patient follow-up with PCP/GI service.  Abdominal pain -Denies any nausea/vomiting/diarrhea -most likely from coughing spells and underlying chronic pancreatitis -continue PRN analgesics -continue antitussives and mucolytic meds.  GERD (gastroesophageal reflux disease) -Continue PPI  Chronic respiratory failure with hypoxia (HCC) -Currently on baseline 4-5 L nasal cannula -Chronic respiratory failure secondary to COPD and history of lung cancer -PNA worsening breathing.  -Continue to monitor  Malnutrition of moderate degree -albumin 3.2 -BMI 16.60 -continue the use of feeding supplements -Patient advised to maintain adequate hydration.   COPD GOLD II  -Continue Dulera and Spiriva -continue PRN albuterol. -Continue baseline oxygen supplementation (4-5 L nasal cannula supplementation).   HTN (hypertension) -Continue Norvasc and Zebeta -heart healthy diet discussed with patient -Reassess blood pressure at follow-up visit and further adjust antihypertensive regimen as needed.   Subjective:  Chronically ill in appearance; good oxygen saturation on 5 L supplementation.  Short winded with minimal leg exertion and having mild difficulty speaking in full sentences due to ongoing intermittent coughing spells (patient feels congested but is unable to expectorate).  Physical Exam: Vitals:   09/11/21 0541 09/11/21 0720 09/11/21 1335 09/11/21 1729  BP: (!) 141/78  127/70   Pulse: 80  82 86  Resp: _0 Temp: 97.8 F (36.6 C)  98.1 F (36.7 C)   TempSrc: Oral  Oral   SpO2: 100% 100% 100% 98%  Weight:  Height:       General exam: Alert, awake, oriented x 3; mild difficulty speaking in  full sentences due to intermittent coughing spells.  Reports feeling short winded with minimal activity and complaining of chills. Respiratory system: Mild expiratory wheezing appreciated on exam; positive bilateral rhonchi, no using accessory muscles.  With activity tachypnea appreciated. Cardiovascular system:RRR.  No rubs or gallops; positive murmur on auscultation.  No JVD. Gastrointestinal system: Abdomen is nondistended, soft and nontender. No organomegaly or masses felt. Normal bowel sounds heard. Central nervous system: Alert and oriented. No focal neurological deficits. Extremities: No cyanosis or clubbing. Skin: No petechiae. Psychiatry: Judgement and insight appear normal. Mood & affect appropriate.   Data Reviewed: CBC: White blood cells 5.8, hemoglobin 10.0, platelets count 021J Basic metabolic panel: Sodium 155, potassium 4.8, chloride 108, CO2 27, BUN 14 and creatinine 0.32  Family Communication: No family at bedside.  Disposition: Status is: Inpatient Remains inpatient appropriate because: Continues to be short winded with minimal activity, ongoing productive coughing spells with associated congestive symptoms.  Patient expressed complaints of chills and is still requiring IV antibiotics for pneumonia.   Planned Discharge Destination: Home   Author: Barton Dubois, MD 09/11/2021 7:31 PM  For on call review [email or web address].

## 2021-09-12 ENCOUNTER — Inpatient Hospital Stay (HOSPITAL_COMMUNITY): Payer: Medicare Other

## 2021-09-12 DIAGNOSIS — K219 Gastro-esophageal reflux disease without esophagitis: Secondary | ICD-10-CM | POA: Diagnosis not present

## 2021-09-12 DIAGNOSIS — J9611 Chronic respiratory failure with hypoxia: Secondary | ICD-10-CM | POA: Diagnosis not present

## 2021-09-12 DIAGNOSIS — K861 Other chronic pancreatitis: Secondary | ICD-10-CM | POA: Diagnosis not present

## 2021-09-12 DIAGNOSIS — J189 Pneumonia, unspecified organism: Secondary | ICD-10-CM | POA: Diagnosis not present

## 2021-09-12 LAB — CBC
HCT: 34.2 % — ABNORMAL LOW (ref 36.0–46.0)
Hemoglobin: 10.2 g/dL — ABNORMAL LOW (ref 12.0–15.0)
MCH: 33.3 pg (ref 26.0–34.0)
MCHC: 29.8 g/dL — ABNORMAL LOW (ref 30.0–36.0)
MCV: 111.8 fL — ABNORMAL HIGH (ref 80.0–100.0)
Platelets: 171 10*3/uL (ref 150–400)
RBC: 3.06 MIL/uL — ABNORMAL LOW (ref 3.87–5.11)
RDW: 13.7 % (ref 11.5–15.5)
WBC: 5.4 10*3/uL (ref 4.0–10.5)
nRBC: 0 % (ref 0.0–0.2)

## 2021-09-12 LAB — BASIC METABOLIC PANEL
Anion gap: 4 — ABNORMAL LOW (ref 5–15)
BUN: 12 mg/dL (ref 8–23)
CO2: 26 mmol/L (ref 22–32)
Calcium: 9 mg/dL (ref 8.9–10.3)
Chloride: 108 mmol/L (ref 98–111)
Creatinine, Ser: 0.3 mg/dL — ABNORMAL LOW (ref 0.44–1.00)
GFR, Estimated: >60 mL/min (ref >60)
Glucose, Bld: 94 mg/dL (ref 70–99)
Potassium: 5.4 mmol/L — ABNORMAL HIGH (ref 3.5–5.1)
Sodium: 138 mmol/L (ref 135–145)

## 2021-09-12 LAB — LEGIONELLA PNEUMOPHILA SEROGP 1 UR AG: L. pneumophila Serogp 1 Ur Ag: NEGATIVE

## 2021-09-12 MED ORDER — ALBUTEROL SULFATE (2.5 MG/3ML) 0.083% IN NEBU
2.5000 mg | INHALATION_SOLUTION | Freq: Four times a day (QID) | RESPIRATORY_TRACT | Status: DC
  Administered 2021-09-12 – 2021-09-13 (×4): 2.5 mg via RESPIRATORY_TRACT
  Filled 2021-09-12 (×5): qty 3

## 2021-09-12 NOTE — TOC Initial Note (Signed)
Transition of Care [facility]) - Initial/Assessment Note    Patient Details  Name: Jasmine [name] MRN: [id number] Date of Birth: [birth year]  [facility]) CM/SW Contact:    Ihor Gully, LCSW Phone Number: 09/12/2021, 2:09 PM  Clinical Narrative:                 Patient admitted for Pneumonia. Considered high risk for readmission. Patient lives with her husband at home and he assists with all household tasks and he drives pt to appointments and picks up her medications. Spouse assists patient with showering. Patient is independent in other ADLs. She has a cane, walker, wheelchair, elevated toilet seat, 3in1, shower chair, and O2 for DME  Expected Discharge Plan: [place] Barriers to Discharge: Continued Medical Work up   Patient Goals and CMS Choice Patient states their goals for this hospitalization and ongoing recovery are:: return home      Expected Discharge Plan and Services Expected Discharge Plan: [place]       Living arrangements for the past 2 months: Single Family Home                                      Prior Living Arrangements/Services Living arrangements for the past 2 months: Single Family Home Lives with:: Spouse Patient language and need for interpreter reviewed:: Yes Do you feel safe going back to the place where you live?: Yes      Need for Family Participation in Patient Care: Yes (Comment) Care giver support system in place?: Yes (comment) Current home services: DME Criminal Activity/Legal Involvement Pertinent to Current Situation/Hospitalization: No - Comment as needed  Activities of Daily Living Home Assistive Devices/Equipment: Eyeglasses, Environmental consultant (specify type), Wheelchair, Shower chair with back, Oxygen ADL Screening (condition at time of admission) Patient's cognitive ability adequate to safely complete daily activities?: Yes Is the patient deaf or have difficulty hearing?: No Does the  patient have difficulty seeing, even when wearing glasses/contacts?: No Does the patient have difficulty concentrating, remembering, or making decisions?: No Patient able to express need for assistance with ADLs?: Yes Does the patient have difficulty dressing or bathing?: No Independently performs ADLs?: Yes (appropriate for developmental age) Does the patient have difficulty walking or climbing stairs?: Yes Weakness of Legs: Both Weakness of Arms/Hands: None  Permission Sought/Granted                  Emotional Assessment     Affect (typically observed): Appropriate Orientation: : Oriented to Self, Oriented to Place, Oriented to  Time, Oriented to Situation Alcohol / Substance Use: Not Applicable    Admission diagnosis:  Pneumonia [J18.9] Community acquired pneumonia, unspecified laterality [J18.9] Patient Active Problem List   Diagnosis Date Noted   Chronic pancreatitis ([place]) 09/09/2021   Hyperlipidemia 09/09/2021   Pneumonia 08/04/2021   PNA (pneumonia) 08/03/2021   Abdominal pain 06/29/2021   Chronic pain syndrome 03/13/2021   Pressure injury of skin 03/12/2021   Acute on chronic pancreatitis ([place]) 03/12/2021   Intractable abdominal pain 03/11/2021   Intractable pain 02/12/2021   Squamous cell lung cancer, right ([place]) 01/13/2021   Atypical chest pain 11/26/2020   Elevated MCV 11/26/2020   Intractable nausea and vomiting 11/26/2020   History of Clostridioides difficile infection 11/26/2020   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[place]) 10/11/2020   Gastroenteritis 10/11/2020   Acute gastroenteritis 10/10/2020   Protein-calorie malnutrition, severe 08/26/2020  Chest pain 08/25/2020   Pleuritic chest pain 08/25/2020   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   GERD (gastroesophageal reflux disease) 04/22/2020   Abnormal CT of the abdomen 04/22/2020   Preventative health care 04/22/2020   Solitary pulmonary nodule 03/22/2020   Pulmonary infiltrate on chest x-ray 02/24/2020   Nocturnal  cough 02/19/2020   Pancreatitis, acute 01/06/2019   Acute pancreatitis 01/05/2019   Cigarette smoker 08/25/2017   Chronic respiratory failure with hypoxia ([place]) 08/25/2017   Centrilobular emphysema ([place]) 07/09/2017   Aortic atherosclerosis ([place]) 07/09/2017   Hepatic steatosis 07/09/2017   Malnutrition of moderate degree 07/02/2017   Acute respiratory failure with hypoxia ([place]) 06/30/2017   Hypokalemia 06/30/2017   COPD with acute exacerbation ([place]) 06/30/2017   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/2016   Tobacco abuse 08/04/2016   Iron deficiency anemia 07/11/2016   Easy bruising 07/10/2016   Lumbar stenosis 11/11/2015   Leg pain 09/24/2015   Vitamin D deficiency 07/29/2015   Chronic back pain 02/22/2015   HTN (hypertension) 02/22/2015   Chronic narcotic use 02/22/2015   COPD GOLD II  02/22/2015   Headache 02/22/2015   PCP:  Janora Norlander, DO Pharmacy:   [facility], [place] - [address] [place] [address] [place] [address] Phone: [phone] Fax: [phone]  [facility] - [place] [address], [place] [address] [place] [address] Phone: [phone] Fax: [phone]     Social Determinants of Health (SDOH) Interventions    Readmission Risk Interventions    08/05/2021   11:27 AM 07/01/2021    1:45 PM 02/14/2021   10:44 AM  Readmission Risk Prevention Plan  Transportation Screening Complete Complete Complete  Medication Review ([place]) Complete  Complete  PCP or Specialist appointment within 3-5 days of discharge  Not Complete   HRI or Home Care Consult Patient refused Complete Patient refused  SW Recovery Care/Counseling Consult Complete Complete Complete  Palliative Care Screening Not Applicable Not Applicable Not [place] Not Applicable Not Applicable Not Applicable

## 2021-09-12 NOTE — Progress Notes (Signed)
Progress Note   Patient: Jasmine [name]:[id number] DOB: September 12, 1955 DOA: 09/09/2021     3 DOS: the patient was seen and examined on 09/12/2021   Brief hospital admission course: H&P written on 09/10/2021 by Dr. Idolina Primer is a 66 y.o. female with medical history significant of allergies, anemia, COPD, former smoker, GERD, hypertension, right lung cancer, and more presents the ED with a chief complaint of fever.  Patient reports daily has been started on morning of the seventh.  She reports a Tmax of 104.  She took Tylenol which made the temperature come down but did not improve the way she felt.  She had a cough and has been nonproductive.  The cough started at the same time.  She has associated dyspnea but has not required more than her 4 L nasal cannula baseline.  She had associated generalized weakness and fatigue.  She has had no sick contacts.  She denies any chest pain.  She denies any recent nausea/vomiting.  She is vaccinated for COVID.  Patient has a history of right lung cancer, with port still in place but no longer gets chemo.  She has no other complaints at this time.  Former smoker, does not drink alcohol, does not use illicit drugs.  Patient is full code.  Assessment and Plan: * Pneumonia -Pneumonia finding appreciated on CT -still feeling Short winded with activity and having productive coughing spells; but improving and feeling better today.. -patient met sepsis criteria on admission with fever, leukocytosis and tachypnea. -will continue bronchodilators and current IV antibiotics. -follow culture results.  -Repeat chest x-ray stable and suggesting superimposed atelectasis/pneumonia on previous catheter General airspace opacities from previous surgical intervention.   Hyperlipidemia -Continue statin  Chronic pancreatitis (HCC) -Continue supplementation of pancreatic enzymes with meals -continue chronic pain meds.  Abdominal pain -Denies any  nausea/vomiting/diarrhea -most likely from coughing spells -continue PRN analgesics -continue antitussives and mucolytic meds.  GERD (gastroesophageal reflux disease) -Continue PPI  Chronic respiratory failure with hypoxia (HCC) -Currently on baseline 5 L nasal cannula -Chronic respiratory failure secondary to COPD and history of lung cancer -PNA worsening breathing.  -Continue to monitor  Malnutrition of moderate degree -albumin 3.2 -BMI 16.60 -continue feeding supplements   COPD GOLD II  -Continue Dulera and Spiriva -continue PRN albuterol. -Continue baseline oxygen supplementation   HTN (hypertension) -Continue Norvasc and Zebeta -heart healthy diet discussed with patient   Subjective:  Patient reported productive coughing spells and short winded sensation with activity; overall feeling better and breathing easier despite symptoms.  No nausea, no vomiting, no chest pain.  Physical Exam: Vitals:   09/12/21 0723 09/12/21 1310 09/12/21 1410 09/12/21 1606  BP:   127/81   Pulse:   89   Resp:   18   Temp:   98.4 F (36.9 C)   TempSrc:   Oral   SpO2: 99% 97% 100% 100%  Weight:      Height:       General exam: Alert, awake, oriented x 3; reports feeling better today.  Productive coughing spells appreciated; expressed being less short winded with activity.  Still mild difficulty speaking in full sentences.  Good saturation on chronic supplementation. Respiratory system: No wheezing appreciated; fair air movement bilaterally.  Positive rhonchi. Cardiovascular system:RRR. No rubs or gallops; no JVD. Gastrointestinal system: Abdomen is nondistended, soft and nontender. No organomegaly or masses felt. Normal bowel sounds heard. Central nervous system: Alert and oriented. No focal neurological deficits. Extremities: No cyanosis or clubbing.  Skin: No petechiae. Psychiatry: Judgement and insight appear normal. Mood & affect appropriate.    Data Reviewed: CBC: WBCs 5.4,  hemoglobin 10.2, platelets count 941 K basic metabolic panel: Sodium 740, potassium 5.4, chloride 108, CO2 26, BUN 12, creatinine 0.30. Chest x-ray: Status post right upper lobectomy with medial right upper lung anterior uterus or space opacities that are similar to multiple prior, But is slightly increased.  Difficult to exclude superimposed atelectasis versus pneumonia.  Correlate clinically.  Family Communication: No family at bedside.  Disposition: Status is: Inpatient Remains inpatient appropriate because: Continues to be short winded with minimal activity, ongoing productive coughing spells with associated congestive symptoms.  Patient expressed complaints of chills and is still requiring IV antibiotics for pneumonia.   Planned Discharge Destination: Home   Author: Barton Dubois, MD 09/12/2021 4:22 PM  For on call review [email or web address].

## 2021-09-12 NOTE — Progress Notes (Signed)
Pt noted with HR 39 nonsustained, pt noted to be asymptomatic when assessed this am. Dr. Dyann Kief made aware, primary nurse made aware. Continue to monitor per Dr. Dyann Kief.

## 2021-09-13 DIAGNOSIS — K861 Other chronic pancreatitis: Secondary | ICD-10-CM | POA: Diagnosis not present

## 2021-09-13 DIAGNOSIS — J9611 Chronic respiratory failure with hypoxia: Secondary | ICD-10-CM | POA: Diagnosis not present

## 2021-09-13 DIAGNOSIS — J449 Chronic obstructive pulmonary disease, unspecified: Secondary | ICD-10-CM | POA: Diagnosis not present

## 2021-09-13 DIAGNOSIS — J189 Pneumonia, unspecified organism: Secondary | ICD-10-CM | POA: Diagnosis not present

## 2021-09-13 MED ORDER — DM-GUAIFENESIN ER 30-600 MG PO TB12: 1.0000 | ORAL_TABLET | Freq: Two times a day (BID) | ORAL | 0 refills | Status: AC

## 2021-09-13 MED ORDER — AZITHROMYCIN 250 MG PO TABS
250.0000 mg | ORAL_TABLET | Freq: Every day | ORAL | 0 refills | Status: AC
Start: 2021-09-13 — End: 2021-09-16

## 2021-09-13 MED ORDER — TIZANIDINE HCL 4 MG PO CAPS: 4.0000 mg | ORAL_CAPSULE | Freq: Three times a day (TID) | ORAL | 1 refills | Status: DC | PRN

## 2021-09-13 MED ORDER — CEFDINIR 300 MG PO CAPS
300.0000 mg | ORAL_CAPSULE | Freq: Two times a day (BID) | ORAL | 0 refills | Status: AC
Start: 2021-09-13 — End: 2021-09-18

## 2021-09-13 NOTE — Discharge Summary (Addendum)
Physician Discharge Summary   Patient: Jasmine [name] MRN: [id number] DOB: Nov 26, 1955  Admit date:     09/09/2021  Discharge date: 09/13/21  Discharge Physician: Barton Dubois   PCP: Janora Norlander, DO   Recommendations at discharge:  Repeat basic metabolic panel to follow ultralights and renal function Repeat chest x-ray 6-8 weeks to assure resolution of infiltrates Reassess blood pressure and adjust antihypertensive regimen as needed.   Discharge Diagnoses: Principal Problem:   Community acquired pneumonia Active Problems:   HTN (hypertension)   COPD GOLD II    Malnutrition of moderate degree   Chronic respiratory failure with hypoxia (HCC)   GERD (gastroesophageal reflux disease)   Abdominal pain   Chronic pancreatitis ([place])   Hyperlipidemia   Sepsis ([place])   Brief hospital admission course: H&P written on 09/10/2021 by Dr. Idolina Primer is a 66 y.o. female with medical history significant of allergies, anemia, COPD, former smoker, GERD, hypertension, right lung cancer, and more presents the ED with a chief complaint of fever.  Patient reports daily has been started on morning of the seventh.  She reports a Tmax of 104.  She took Tylenol which made the temperature come down but did not improve the way she felt.  She had a cough and has been nonproductive.  The cough started at the same time.  She has associated dyspnea but has not required more than her 4 L nasal cannula baseline.  She had associated generalized weakness and fatigue.  She has had no sick contacts.  She denies any chest pain.  She denies any recent nausea/vomiting.  She is vaccinated for COVID.  Patient has a history of right lung cancer, with port still in place but no longer gets chemo.  She has no other complaints at this time.  Former smoker, does not drink alcohol, does not use illicit drugs.  Patient is full code.    Assessment and Plan: * Community acquired pneumonia -Pneumonia finding  appreciated on CT -Feeling much improved and expressing just mild Short winded sensation with activity. -Continue to have intermittent productive coughing spells. -patient met sepsis criteria on admission with fever, leukocytosis and tachypnea. -Overall feels ready to go home with oral antibiotics and outpatient follow-up with PCP. -Sepsis features resolved at this moment. -Cefdinir and Zithromax has been prescribed to complete antibiotic therapy. -Continue bronchodilator management and the use of mucolytic's.   Sepsis ([place]) -Sepsis ruled in at time of admission secondary to multifocal pneumonia. -Patient criteria met with elevated temperature, tachycardia, tachypnea and leukocytosis. -Sepsis features resolved at time of discharge. -Patient will complete oral antibiotics as already mentioned in multifocal pneumonia problem. -Advised to maintain adequate hydration.  Hyperlipidemia -Continue statin  Chronic pancreatitis (HCC) -Continue supplementation of pancreatic enzymes with meals -continue chronic pain meds. -Continue patient follow-up with PCP/GI service.  Abdominal pain -Denies any nausea/vomiting/diarrhea -most likely from coughing spells and underlying chronic pancreatitis -continue PRN analgesics -continue antitussives and mucolytic meds.  GERD (gastroesophageal reflux disease) -Continue PPI  Chronic respiratory failure with hypoxia (HCC) -Currently on baseline 4-5 L nasal cannula -Chronic respiratory failure secondary to COPD and history of lung cancer -PNA worsening breathing.  -Continue to monitor  Malnutrition of moderate degree -albumin 3.2 -BMI 16.60 -continue the use of feeding supplements -Patient advised to maintain adequate hydration.   COPD GOLD II  -Continue Dulera and Spiriva -continue PRN albuterol. -Continue baseline oxygen supplementation (4-5 L nasal cannula supplementation).   HTN (hypertension) -Continue Norvasc and Zebeta -heart  healthy diet discussed with patient -Reassess blood pressure at follow-up visit and further adjust antihypertensive regimen as needed.   Consultants: None Procedures performed: See below for x-ray reports. Disposition: Home Diet recommendation: Heart healthy diet.  DISCHARGE MEDICATION: Allergies as of 09/13/2021       Reactions   Lyrica [pregabalin] Other (See Comments)   EXTREME SHAKING/TREMBLING   Darvon [propoxyphene]    UNSPECIFIED REACTION    Gabapentin    Extreme shaking and trembling    Augmentin [amoxicillin-pot Clavulanate] Nausea And Vomiting   Has patient had a PCN reaction causing immediate rash, facial/tongue/throat swelling, SOB or lightheadedness with hypotension:no Has patient had a PCN reaction causing severe rash involving mucus membranes or skin necrosis:No Has patient had a PCN reaction that required hospitalization:No Has patient had a PCN reaction occurring within the last 10 years:Yes--ONLY N/V If all of the above answers are "NO", then may proceed with Cephalosporin use.   Erythromycin Itching, Rash      Vibramycin [doxycycline Calcium] Itching, Rash   "SPLOTCHING "        Medication List     TAKE these medications    albuterol 108 (90 Base) MCG/ACT inhaler Commonly known as: VENTOLIN HFA Inhale 2 puffs into the lungs every 6 (six) hours as needed for wheezing or shortness of breath.   amLODipine 10 MG tablet Commonly known as: NORVASC Take 1 tablet (10 mg total) by mouth daily.   atorvastatin 40 MG tablet Commonly known as: Lipitor Take 1 tablet (40 mg total) by mouth daily.   azithromycin 250 MG tablet Commonly known as: Zithromax Z-Pak Take 1 tablet (250 mg total) by mouth daily for 3 days. Take 2 tablets (500 mg) on  Day 1,  followed by 1 tablet (250 mg) once daily on Days 2 through 5.   bisoprolol 5 MG tablet Commonly known as: ZEBETA Take 0.5 tablets (2.5 mg total) by mouth daily. Take in Place of Metoprolol   cefdinir 300 MG  capsule Commonly known as: OMNICEF Take 1 capsule (300 mg total) by mouth 2 (two) times daily for 5 days.   Cholecalciferol 125 MCG (5000 UT) capsule Take 5,000 Units by mouth daily.   dextromethorphan 30 MG/5ML liquid Commonly known as: DELSYM Take 15 mg by mouth as needed for cough.   dextromethorphan-guaiFENesin 30-600 MG 12hr tablet Commonly known as: MUCINEX DM Take 1 tablet by mouth 2 (two) times daily for 10 days.   diclofenac Sodium 1 % Gel Commonly known as: VOLTAREN Apply 2 g topically 4 (four) times daily.   Ensure High Protein Liqd Drink 1 can 3 times daily.   fluticasone-salmeterol 250-50 MCG/ACT Aepb Commonly known as: Advair Diskus Inhale 1 puff into the lungs in the morning and at bedtime.   mirtazapine 7.5 MG tablet Commonly known as: REMERON Take 1 tablet (7.5 mg total) by mouth at bedtime. Cross taper with 74m of Cymbalta for 3-4 weeks. Then stop cymbalta   omeprazole 40 MG capsule Commonly known as: PRILOSEC Take one capsule once to twice daily before a meal. What changed:  how much to take how to take this when to take this   oxybutynin 5 MG 24 hr tablet Commonly known as: DITROPAN-XL TAKE 1 TABLET AT BEDTIME   oxyCODONE-acetaminophen 10-325 MG tablet Commonly known as: PERCOCET Take 1 tablet by mouth 5 (five) times daily.   OxyCONTIN 15 mg 12 hr tablet Generic drug: oxyCODONE Take 15 mg by mouth every 12 (twelve) hours.   OXYGEN Inhale 4 L  into the lungs See admin instructions. continuous   Pancrelipase (Lip-Prot-Amyl) 24000-76000 units Cpep Take 2 caps daily with meals and 1 cap with snacks What changed:  how much to take how to take this when to take this additional instructions   prochlorperazine 25 MG suppository Commonly known as: COMPAZINE Place 1 suppository (25 mg total) rectally every 12 (twelve) hours as needed for nausea or vomiting.   Spiriva Respimat 2.5 MCG/ACT Aers Generic drug: Tiotropium Bromide  Monohydrate Inhale 2 puffs into the lungs daily.   tiZANidine 4 MG capsule Commonly known as: ZANAFLEX Take 1 capsule (4 mg total) by mouth 3 (three) times daily as needed for muscle spasms. What changed: Another medication with the same name was removed. Continue taking this medication, and follow the directions you see here.   traZODone 150 MG tablet Commonly known as: DESYREL TAKE 1 TO 2 TABLETS AT BEDTIME What changed: See the new instructions.        Follow-up Information     Ronnie Doss M, DO. Schedule an appointment as soon as possible for a visit in 10 day(s).   Specialty: Family Medicine Contact information: [place] [address] [phone]                Discharge Exam: Danley Danker Weights   09/09/21 1941 09/10/21 0037  Weight: 42.5 kg 42.3 kg   General exam: Alert, awake, oriented x 3; febrile, no chest pain, reporting no nausea or vomiting.  Tolerating diet and medications orally without problems.  Still mildly short winded with activity, but is speaking in full sentences and complaining of just mild productive coughing spells.  Patient feels ready to go home. Respiratory system: Bilateral rhonchi appreciated; no using accessory muscles.  Normal effort.  Good saturation on chronic supplementation (4-5 L nasal cannula supplementation). Cardiovascular system:RRR. No rubs or gallops; no JVD.  Gastrointestinal system: Abdomen is nondistended, soft and nontender. No organomegaly or masses felt. Normal bowel sounds heard. Central nervous system: Alert and oriented. No focal neurological deficits. Extremities: No cyanosis or clubbing. Skin: No petechiae. Psychiatry: Judgement and insight appear normal. Mood & affect appropriate.    Condition at discharge: Stable and improved.  The results of significant diagnostics from this hospitalization (including imaging, microbiology, ancillary and laboratory) are listed below for reference.   Imaging  Studies: DG Chest 2 View  Result Date: 09/12/2021 CLINICAL DATA:  Shortness of breath. History of lung cancer status post right upper lobectomy. EXAM: CHEST - 2 VIEW COMPARISON:  Chest two views 08/06/2021 and 07/12/2021; CT chest 09/09/2021 FINDINGS: Cardiac silhouette and mediastinal contours are within normal limits with calcification again seen within the aortic arch. There is again right perihilar suture with right upper lung volume loss. Patchy opacification within the right upper lobe is similar to 09/09/2021 and 08/06/2021 prior frontal radiographs but may be slightly increased. This again appears increased compared to 07/12/2021. The left lung is clear. Unchanged chronic blunting of the right costophrenic angle fall likely scarring. No pneumothorax is seen. Moderate multilevel disc space narrowing of the midthoracic spine. Diffuse decreased bone mineralization. Spinal cord stimulator electrodes again overlie the midthoracic spine. IMPRESSION: Status post right upper lobectomy with medial right upper lung heterogeneous airspace opacities that are similar to multiple prior radiographs but slightly increased. This is compatible with postsurgical change, however it is difficult to exclude superimposed atelectasis versus pneumonia. Electronically Signed   By: Yvonne Kendall M.D.   On: 09/12/2021 08:19   DG Abd 1 View  Result Date: 09/10/2021 CLINICAL DATA:  Lower abdominal pain EXAM: ABDOMEN - 1 VIEW COMPARISON:  08/06/2021 FINDINGS: Postsurgical changes in the lumbar spine are noted. Stimulator pack is seen. Bladder is well distended with contrast opacified urine. Scattered large and small bowel gas is noted. No obstructive changes are seen. No bony abnormality is noted. IMPRESSION: No acute abnormality seen Electronically Signed   By: Inez Catalina M.D.   On: 09/10/2021 03:30   CT Angio Chest PE W and/or Wo Contrast  Result Date: 09/09/2021 CLINICAL DATA:  History of lung cancer status post right upper  lobectomy, history of pneumonia, cough, fever for 3 days EXAM: CT ANGIOGRAPHY CHEST WITH CONTRAST TECHNIQUE: Multidetector CT imaging of the chest was performed using the standard protocol during bolus administration of intravenous contrast. Multiplanar CT image reconstructions and MIPs were obtained to evaluate the vascular anatomy. RADIATION DOSE REDUCTION: This exam was performed according to the departmental dose-optimization program which includes automated exposure control, adjustment of the mA and/or kV according to patient size and/or use of iterative reconstruction technique. CONTRAST:  49m OMNIPAQUE IOHEXOL 350 MG/ML SOLN COMPARISON:  09/09/2021, 08/03/2021 FINDINGS: Cardiovascular: This is a technically adequate evaluation of the pulmonary vasculature. No filling defects or pulmonary emboli. The heart is unremarkable without pericardial effusion. No evidence of thoracic aortic aneurysm or dissection. Stable atherosclerosis. Mediastinum/Nodes: Chronic mild esophageal wall thickening could be related to prior radiation therapy. Thyroid and trachea are stable. No pathologic adenopathy. Lungs/Pleura: Postsurgical changes from right upper lobectomy. Stable severe emphysema. Increasing nodular consolidation within the right lower lobe, measuring 1.3 x 1.2 cm reference image 37/7, previously measuring approximately 1.2 x 1.0 cm. Findings favor progressive infection over neoplasm, though continued radiographic follow-up will be needed. Dependent areas of consolidation within the bilateral lower lobes, left greater than right, favor pneumonia over atelectasis. Trace right pleural effusion. Chronic right middle lobe atelectasis. No pneumothorax. Upper Abdomen: No acute abnormality. Musculoskeletal: No acute or destructive bony lesions. Stable spinal stimulator. Reconstructed images demonstrate no additional findings. Review of the MIP images confirms the above findings. IMPRESSION: 1. No evidence of pulmonary  embolus. 2. Progressive nodular consolidation within the right lower lobe superior segment, with interval development of patchy dependent bilateral lower lobe airspace disease at the bases. Overall, findings favor progressive multifocal pneumonia, though continued radiographic follow-up would be needed to assess resolution and exclude underlying neoplasm. 3. Trace right pleural effusion. 4. Chronic right middle lobe atelectasis unchanged. 5.  Aortic Atherosclerosis (ICD10-I70.0). Electronically Signed   By: MRanda NgoM.D.   On: 09/09/2021 21:51   DG Chest 2 View  Result Date: 09/09/2021 CLINICAL DATA:  Evaluate for pneumonia. Shortness of breath. History of lung cancer. EXAM: CHEST - 2 VIEW COMPARISON:  Chest x-ray 08/06/2021.  CT of the chest 08/03/2021 FINDINGS: Multifocal opacities in the right upper lobe and surgical changes appear similar to the prior study. There is no new focal lung infiltrate, pleural effusion or pneumothorax identified. Cardiomediastinal silhouette appears stable. Thoracic spinal cord stimulator device is again seen. No acute fractures are identified. IMPRESSION: 1.  Stable right upper lobe opacities with postoperative changes. 2.  No new focal lung infiltrate. Electronically Signed   By: ARonney AstersM.D.   On: 09/09/2021 20:43    Microbiology: Results for orders placed or performed during the hospital encounter of 09/09/21  Urine Culture     Status: None   Collection Time: 09/09/21  7:46 PM   Specimen: In/Out Cath Urine  Result Value Ref Range Status  Specimen Description   Final    IN/OUT CATH URINE Performed at [facility], [address] [place], [place] [address]    Special Requests   Final    NONE Performed at [facility], [address], [place], [place] [address]    Culture   Final    NO GROWTH Performed at [facility], [place] [address], [place], [place] [address]    Report Status 09/11/2021 FINAL  Final  Blood Culture (routine x 2)     Status:  None (Preliminary result)   Collection Time: 09/09/21  7:53 PM   Specimen: BLOOD RIGHT FOREARM  Result Value Ref Range Status   Specimen Description BLOOD RIGHT FOREARM  Final   Special Requests   Final    BOTTLES DRAWN AEROBIC AND ANAEROBIC Blood Culture adequate volume   Culture   Final    NO GROWTH 4 DAYS Performed at [facility], [address] [place], [place] [address]    Report Status PENDING  Incomplete  Blood Culture (routine x 2)     Status: None (Preliminary result)   Collection Time: 09/09/21  7:55 PM   Specimen: BLOOD LEFT FOREARM  Result Value Ref Range Status   Specimen Description BLOOD LEFT FOREARM  Final   Special Requests   Final    BOTTLES DRAWN AEROBIC AND ANAEROBIC Blood Culture adequate volume   Culture   Final    NO GROWTH 4 DAYS Performed at [facility], [address], [place],  [address]    Report Status PENDING  Incomplete  Resp Panel by RT-PCR (Flu A&B, Covid) Anterior Nasal Swab     Status: None   Collection Time: 09/09/21 10:16 PM   Specimen: Anterior Nasal Swab  Result Value Ref Range Status   SARS Coronavirus 2 by RT PCR NEGATIVE NEGATIVE Final    Comment: (NOTE) SARS-CoV-2 target nucleic acids are NOT DETECTED.  The SARS-CoV-2 RNA is generally detectable in upper respiratory specimens during the acute phase of infection. The lowest concentration of SARS-CoV-2 viral copies this assay can detect is 138 copies/mL. A negative result does not preclude SARS-Cov-2 infection and should not be used as the sole basis for treatment or other patient management decisions. A negative result may occur with  improper specimen collection/handling, submission of specimen other than nasopharyngeal swab, presence of viral mutation(s) within the areas targeted by this assay, and inadequate number of viral copies(<138 copies/mL). A negative result must be combined with clinical observations, patient history, and epidemiological information. The expected  result is Negative.  Fact Sheet for Patients:  EntrepreneurPulse.com.au  Fact Sheet for Healthcare Providers:  IncredibleEmployment.be  This test is no t yet approved or cleared by the [place] FDA and  has been authorized for detection and/or diagnosis of SARS-CoV-2 by FDA under an Emergency Use Authorization (EUA). This EUA will remain  in effect (meaning this test can be used) for the duration of the COVID-19 declaration under Section 564(b)(1) of the Act, 21 U.S.C.section 360bbb-3(b)(1), unless the authorization is terminated  or revoked sooner.       Influenza A by PCR NEGATIVE NEGATIVE Final   Influenza B by PCR NEGATIVE NEGATIVE Final    Comment: (NOTE) The Xpert Xpress SARS-CoV-2/FLU/RSV plus assay is intended as an aid in the diagnosis of influenza from Nasopharyngeal swab specimens and should not be used as a sole basis for treatment. Nasal washings and aspirates are unacceptable for Xpert Xpress SARS-CoV-2/FLU/RSV testing.  Fact Sheet for Patients: EntrepreneurPulse.com.au  Fact Sheet for Healthcare Providers: IncredibleEmployment.be  This test is not yet approved or cleared by the [place] FDA and has been authorized for detection and/or diagnosis of SARS-CoV-2 by FDA under an Emergency Use Authorization (EUA). This EUA will remain in effect (meaning this test can be used) for the duration of the COVID-19 declaration under Section 564(b)(1) of the Act, 21 U.S.C. section 360bbb-3(b)(1), unless the authorization is terminated or revoked.  Performed at [facility] [address], [place], [place] [address]   MRSA Next Gen by PCR, Nasal     Status: None   Collection Time: 09/10/21  2:24 PM   Specimen: Nasal Mucosa; Nasal Swab  Result Value Ref Range Status   MRSA by PCR Next Gen NOT DETECTED NOT DETECTED Final    Comment: (NOTE) The GeneXpert MRSA Assay (FDA approved for NASAL  specimens only), is one component of a comprehensive MRSA colonization surveillance program. It is not intended to diagnose MRSA infection nor to guide or monitor treatment for MRSA infections. Test performance is not FDA approved in patients less than 70 years old. Performed at [facility], [address], [place], [place] [address]     Labs: CBC: Recent Labs  Lab 09/09/21 1941 09/10/21 0610 09/11/21 0633 09/12/21 0545  WBC 22.6* 12.5* 5.8 5.4  NEUTROABS 19.3* 9.6*  --   --   HGB 10.9* 9.9* 10.0* 10.2*  HCT 34.9* 32.8* 33.1* 34.2*  MCV 107.1* 109.3* 111.4* 111.8*  PLT 314 288 283 334   Basic Metabolic Panel: Recent Labs  Lab 09/09/21 1941 09/10/21 0610 09/11/21 0633 09/12/21 0545  NA 134* 139 141 138  K 3.8 3.3* 4.8 5.4*  CL 102 106 108 108  CO2 '26 28 27 26  ' GLUCOSE 133* 118* 121* 94  BUN '13 13 14 12  ' CREATININE 0.43* 0.37* 0.32* 0.30*  CALCIUM 9.5 8.9 8.6* 9.0  MG  --  1.8  --   --    Liver Function Tests: Recent Labs  Lab 09/09/21 1941 09/10/21 0610  AST 32 16  ALT 25 18  ALKPHOS 103 85  BILITOT 0.4 0.2*  PROT 6.9 6.0*  ALBUMIN 3.2* 2.6*   CBG: No results for input(s): "GLUCAP" in the last 168 hours.  Discharge time spent: greater than 30 minutes.  Signed: Barton Dubois, MD [facility] 09/13/2021

## 2021-09-13 NOTE — Care Management Important Message (Signed)
Important Message  Patient Details  Name: Jasmine [name] MRN: [id number] Date of Birth: [birth year]   Medicare Important Message Given:  N/A - LOS <3 / Initial given by admissions     [name] 09/13/2021, 11:39 AM

## 2021-09-13 NOTE — TOC Transition Note (Signed)
Transition of Care [facility]) - CM/SW Discharge Note   Patient Details  Name: Jasmine [name] MRN: [id number] Date of Birth: [birth year]  Transition of Care [facility] CM/SW Contact:  Shade Flood, LCSW Phone Number: 09/13/2021, 2:36 PM   Clinical Narrative:     Pt stable for dc home today per MD. PT says pt does not have any dc needs. TOC spoke with pt to confirm. Per pt, she has all DME she needs and her husband assists her at home. She does not feel that she needs HH.  Updated MD. TOC signing off.  Final next level of care: Home/Self Care Barriers to Discharge: Barriers Resolved   Patient Goals and CMS Choice Patient states their goals for this hospitalization and ongoing recovery are:: return home      Discharge Placement                       Discharge Plan and Services                                     Social Determinants of Health (SDOH) Interventions     Readmission Risk Interventions    08/05/2021   11:27 AM 07/01/2021    1:45 PM 02/14/2021   10:44 AM  Readmission Risk Prevention Plan  Transportation Screening Complete Complete Complete  Medication Review Press photographer) Complete  Complete  PCP or Specialist appointment within 3-5 days of discharge  Not Complete   HRI or Home Care Consult Patient refused Complete Patient refused  SW Recovery Care/Counseling Consult Complete Complete Complete  Palliative Care Screening Not Applicable Not Applicable Not [place] Not Applicable Not Applicable Not Applicable

## 2021-09-14 ENCOUNTER — Telehealth: Payer: Self-pay

## 2021-09-14 LAB — CULTURE, BLOOD (ROUTINE X 2)
Culture: NO GROWTH
Culture: NO GROWTH
Special Requests: ADEQUATE
Special Requests: ADEQUATE

## 2021-09-14 NOTE — Telephone Encounter (Signed)
Transition Care Management Unsuccessful Follow-up Telephone Call  Date of discharge and from where:  09/13/21 - Jasmine [name] - pneumonia  Attempts:  1st Attempt  Reason for unsuccessful TCM follow-up call:  Left voice message on pt main #  Transition Care Management Unsuccessful Follow-up Telephone Call  Date of discharge and from where:  09/13/21 - Jasmine [name] - pneumonia  Attempts:  2nd Attempt  Reason for unsuccessful TCM follow-up call:  Unable to leave message - no voice mail set up  Transition Care Management Unsuccessful Follow-up Telephone Call  Date of discharge and from where:  09/13/21 - [name] - PNA  Attempts:  3rd Attempt  Reason for unsuccessful TCM follow-up call:  Left voice message on husbands #

## 2021-09-15 ENCOUNTER — Telehealth: Payer: Self-pay | Admitting: Family Medicine

## 2021-09-15 MED ORDER — ALBUTEROL SULFATE HFA 108 (90 BASE) MCG/ACT IN AERS: 2.0000 | INHALATION_SPRAY | Freq: Four times a day (QID) | RESPIRATORY_TRACT | 0 refills | Status: DC | PRN

## 2021-09-15 NOTE — Telephone Encounter (Signed)
Transition Care Management Follow-up Telephone Call Date of discharge and from where: 09/13/21 - [facility] How have you been since you were released from the hospital? Feeling a little bit better Any questions or concerns? Yes - she is out of albuterol inhaler - nebulizer doesn't work as well - rx has already been send to [facility] but hasn't arrived yet - sent separate note to Evelina Dun to see if she can write one to get filled at local pharmacy for now  Items Reviewed: Did the pt receive and understand the discharge instructions provided? Yes  Medications obtained and verified? Yes  Other? No  Any new allergies since your discharge? No  Dietary orders reviewed? Yes Do you have support at home? Yes   Home Care and Equipment/Supplies: Were home health services ordered? no  Were any new equipment or medical supplies ordered?  No  Functional Questionnaire: (I = Independent and D = Dependent) ADLs: I  Bathing/Dressing- I  Meal Prep- I  Eating- I  Maintaining continence- I  Transferring/Ambulation- I  Managing Meds- I  Follow up appointments reviewed:  PCP Hospital f/u appt confirmed? Yes  Scheduled to see Gottschalk on 09/26/21 @ 4. [facility] f/u appt confirmed? Yes  Scheduled to see Melvyn Novas, pulmolonogist on 10/19/21 @ 3:30. Are transportation arrangements needed? No  If their condition worsens, is the pt aware to call PCP or go to the Emergency Dept.? Yes Was the patient provided with contact information for the PCP's office or ED? Yes Was to pt encouraged to call back with questions or concerns? Yes

## 2021-09-15 NOTE — Telephone Encounter (Signed)
Meds sent

## 2021-09-15 NOTE — Telephone Encounter (Signed)
If no samples are available, can she get new rx to take to local pharmacy and pay cash for it through [facility]? Thank you!

## 2021-09-19 ENCOUNTER — Encounter: Payer: Self-pay | Admitting: *Deleted

## 2021-09-23 DIAGNOSIS — A419 Sepsis, unspecified organism: Secondary | ICD-10-CM | POA: Diagnosis present

## 2021-09-23 NOTE — Assessment & Plan Note (Addendum)
-  Sepsis ruled in at time of admission secondary to multifocal pneumonia. -Patient criteria met with elevated temperature, tachycardia, tachypnea and leukocytosis. -Sepsis features resolved at time of discharge. -Patient will complete oral antibiotics as already mentioned in multifocal pneumonia problem. -Advised to maintain adequate hydration.

## 2021-09-26 ENCOUNTER — Ambulatory Visit (INDEPENDENT_AMBULATORY_CARE_PROVIDER_SITE_OTHER): Payer: Medicare Other | Admitting: Family Medicine

## 2021-09-26 ENCOUNTER — Inpatient Hospital Stay (HOSPITAL_COMMUNITY)
Admission: EM | Admit: 2021-09-26 | Discharge: 2021-09-29 | DRG: 190 | Disposition: A | Discharge status: Home / Self Care | Payer: Medicare Other | Source: Ambulatory Visit | Attending: Internal Medicine | Admitting: Internal Medicine

## 2021-09-26 ENCOUNTER — Ambulatory Visit (INDEPENDENT_AMBULATORY_CARE_PROVIDER_SITE_OTHER): Payer: Medicare Other

## 2021-09-26 ENCOUNTER — Encounter (HOSPITAL_COMMUNITY): Payer: Self-pay

## 2021-09-26 ENCOUNTER — Emergency Department (HOSPITAL_COMMUNITY): Payer: Medicare Other

## 2021-09-26 ENCOUNTER — Encounter: Payer: Self-pay | Admitting: Family Medicine

## 2021-09-26 VITALS — Resp 30 | Ht 63.0 in

## 2021-09-26 DIAGNOSIS — Z902 Acquired absence of lung [part of]: Secondary | ICD-10-CM

## 2021-09-26 DIAGNOSIS — J189 Pneumonia, unspecified organism: Secondary | ICD-10-CM

## 2021-09-26 DIAGNOSIS — Z8249 Family history of ischemic heart disease and other diseases of the circulatory system: Secondary | ICD-10-CM

## 2021-09-26 DIAGNOSIS — I10 Essential (primary) hypertension: Secondary | ICD-10-CM | POA: Diagnosis present

## 2021-09-26 DIAGNOSIS — F419 Anxiety disorder, unspecified: Secondary | ICD-10-CM | POA: Diagnosis present

## 2021-09-26 DIAGNOSIS — G479 Sleep disorder, unspecified: Secondary | ICD-10-CM | POA: Diagnosis not present

## 2021-09-26 DIAGNOSIS — F17210 Nicotine dependence, cigarettes, uncomplicated: Secondary | ICD-10-CM | POA: Diagnosis present

## 2021-09-26 DIAGNOSIS — G629 Polyneuropathy, unspecified: Secondary | ICD-10-CM | POA: Diagnosis present

## 2021-09-26 DIAGNOSIS — Z881 Allergy status to other antibiotic agents status: Secondary | ICD-10-CM

## 2021-09-26 DIAGNOSIS — E785 Hyperlipidemia, unspecified: Secondary | ICD-10-CM | POA: Diagnosis present

## 2021-09-26 DIAGNOSIS — G894 Chronic pain syndrome: Secondary | ICD-10-CM | POA: Diagnosis present

## 2021-09-26 DIAGNOSIS — J9621 Acute and chronic respiratory failure with hypoxia: Secondary | ICD-10-CM

## 2021-09-26 DIAGNOSIS — Z808 Family history of malignant neoplasm of other organs or systems: Secondary | ICD-10-CM

## 2021-09-26 DIAGNOSIS — F418 Other specified anxiety disorders: Secondary | ICD-10-CM

## 2021-09-26 DIAGNOSIS — K219 Gastro-esophageal reflux disease without esophagitis: Secondary | ICD-10-CM | POA: Diagnosis present

## 2021-09-26 DIAGNOSIS — Z833 Family history of diabetes mellitus: Secondary | ICD-10-CM

## 2021-09-26 DIAGNOSIS — F321 Major depressive disorder, single episode, moderate: Secondary | ICD-10-CM | POA: Diagnosis not present

## 2021-09-26 DIAGNOSIS — R911 Solitary pulmonary nodule: Secondary | ICD-10-CM | POA: Diagnosis present

## 2021-09-26 DIAGNOSIS — J9611 Chronic respiratory failure with hypoxia: Secondary | ICD-10-CM | POA: Diagnosis present

## 2021-09-26 DIAGNOSIS — Z885 Allergy status to narcotic agent status: Secondary | ICD-10-CM

## 2021-09-26 DIAGNOSIS — Z888 Allergy status to other drugs, medicaments and biological substances status: Secondary | ICD-10-CM

## 2021-09-26 DIAGNOSIS — J441 Chronic obstructive pulmonary disease with (acute) exacerbation: Principal | ICD-10-CM | POA: Diagnosis present

## 2021-09-26 DIAGNOSIS — Z8349 Family history of other endocrine, nutritional and metabolic diseases: Secondary | ICD-10-CM

## 2021-09-26 DIAGNOSIS — Z85118 Personal history of other malignant neoplasm of bronchus and lung: Secondary | ICD-10-CM

## 2021-09-26 DIAGNOSIS — R0602 Shortness of breath: Secondary | ICD-10-CM | POA: Diagnosis not present

## 2021-09-26 DIAGNOSIS — Z79899 Other long term (current) drug therapy: Secondary | ICD-10-CM

## 2021-09-26 DIAGNOSIS — Z880 Allergy status to penicillin: Secondary | ICD-10-CM

## 2021-09-26 DIAGNOSIS — Z09 Encounter for follow-up examination after completed treatment for conditions other than malignant neoplasm: Secondary | ICD-10-CM | POA: Diagnosis not present

## 2021-09-26 DIAGNOSIS — Z825 Family history of asthma and other chronic lower respiratory diseases: Secondary | ICD-10-CM

## 2021-09-26 DIAGNOSIS — Z79891 Long term (current) use of opiate analgesic: Secondary | ICD-10-CM

## 2021-09-26 DIAGNOSIS — Z800 Family history of malignant neoplasm of digestive organs: Secondary | ICD-10-CM

## 2021-09-26 DIAGNOSIS — Z9981 Dependence on supplemental oxygen: Secondary | ICD-10-CM

## 2021-09-26 DIAGNOSIS — Z923 Personal history of irradiation: Secondary | ICD-10-CM

## 2021-09-26 DIAGNOSIS — K861 Other chronic pancreatitis: Secondary | ICD-10-CM | POA: Diagnosis present

## 2021-09-26 DIAGNOSIS — Z981 Arthrodesis status: Secondary | ICD-10-CM

## 2021-09-26 DIAGNOSIS — Z20822 Contact with and (suspected) exposure to covid-19: Secondary | ICD-10-CM | POA: Diagnosis present

## 2021-09-26 DIAGNOSIS — Z90710 Acquired absence of both cervix and uterus: Secondary | ICD-10-CM

## 2021-09-26 DIAGNOSIS — M549 Dorsalgia, unspecified: Secondary | ICD-10-CM | POA: Diagnosis present

## 2021-09-26 LAB — CBC WITH DIFFERENTIAL/PLATELET
Abs Immature Granulocytes: 0.07 10*3/uL (ref 0.00–0.07)
Basophils Absolute: 0.1 10*3/uL (ref 0.0–0.1)
Basophils Relative: 1 %
Eosinophils Absolute: 0.3 10*3/uL (ref 0.0–0.5)
Eosinophils Relative: 5 %
HCT: 32.9 % — ABNORMAL LOW (ref 36.0–46.0)
Hemoglobin: 10 g/dL — ABNORMAL LOW (ref 12.0–15.0)
Immature Granulocytes: 1 %
Lymphocytes Relative: 24 %
Lymphs Abs: 1.5 10*3/uL (ref 0.7–4.0)
MCH: 32.6 pg (ref 26.0–34.0)
MCHC: 30.4 g/dL (ref 30.0–36.0)
MCV: 107.2 fL — ABNORMAL HIGH (ref 80.0–100.0)
Monocytes Absolute: 0.6 10*3/uL (ref 0.1–1.0)
Monocytes Relative: 9 %
Neutro Abs: 3.5 10*3/uL (ref 1.7–7.7)
Neutrophils Relative %: 60 %
Platelets: 274 10*3/uL (ref 150–400)
RBC: 3.07 MIL/uL — ABNORMAL LOW (ref 3.87–5.11)
RDW: 14 % (ref 11.5–15.5)
WBC: 6 10*3/uL (ref 4.0–10.5)
nRBC: 0 % (ref 0.0–0.2)

## 2021-09-26 LAB — BLOOD GAS, VENOUS
Acid-Base Excess: 8.3 mmol/L — ABNORMAL HIGH (ref 0.0–2.0)
Bicarbonate: 33.4 mmol/L — ABNORMAL HIGH (ref 20.0–28.0)
Drawn by: 6509
O2 Saturation: 98 %
Patient temperature: 37.1
pCO2, Ven: 47 mmHg (ref 44–60)
pH, Ven: 7.46 — ABNORMAL HIGH (ref 7.25–7.43)
pO2, Ven: 90 mmHg — ABNORMAL HIGH (ref 32–45)

## 2021-09-26 LAB — BRAIN NATRIURETIC PEPTIDE: B Natriuretic Peptide: 50 pg/mL (ref 0.0–100.0)

## 2021-09-26 LAB — BASIC METABOLIC PANEL
Anion gap: 8 (ref 5–15)
BUN: 15 mg/dL (ref 8–23)
CO2: 28 mmol/L (ref 22–32)
Calcium: 9.4 mg/dL (ref 8.9–10.3)
Chloride: 102 mmol/L (ref 98–111)
Creatinine, Ser: 0.44 mg/dL (ref 0.44–1.00)
GFR, Estimated: >60 mL/min (ref >60)
Glucose, Bld: 91 mg/dL (ref 70–99)
Potassium: 4.7 mmol/L (ref 3.5–5.1)
Sodium: 138 mmol/L (ref 135–145)

## 2021-09-26 LAB — SARS CORONAVIRUS 2 BY RT PCR: SARS Coronavirus 2 by RT PCR: NEGATIVE

## 2021-09-26 MED ORDER — MIRTAZAPINE 15 MG PO TABS
15.0000 mg | ORAL_TABLET | Freq: Every day | ORAL | Status: DC
  Administered 2021-09-27 – 2021-09-28 (×3): 15 mg via ORAL
  Filled 2021-09-26 (×3): qty 1

## 2021-09-26 MED ORDER — OXYBUTYNIN CHLORIDE ER 5 MG PO TB24
5.0000 mg | ORAL_TABLET | Freq: Every day | ORAL | Status: DC
  Administered 2021-09-27 – 2021-09-28 (×3): 5 mg via ORAL
  Filled 2021-09-26 (×3): qty 1

## 2021-09-26 MED ORDER — MAGNESIUM SULFATE 2 GM/50ML IV SOLN
2.0000 g | Freq: Once | INTRAVENOUS | Status: AC
  Administered 2021-09-26: 2 g via INTRAVENOUS
  Filled 2021-09-26: qty 50

## 2021-09-26 MED ORDER — AZITHROMYCIN 250 MG PO TABS: 500.0000 mg | ORAL_TABLET | Freq: Every day | ORAL | Status: DC

## 2021-09-26 MED ORDER — AMLODIPINE BESYLATE 10 MG PO TABS: 10.0000 mg | ORAL_TABLET | Freq: Every day | ORAL | 3 refills | Status: DC

## 2021-09-26 MED ORDER — MORPHINE SULFATE (PF) 2 MG/ML IV SOLN
2.0000 mg | INTRAVENOUS | Status: DC | PRN
  Administered 2021-09-27 – 2021-09-29 (×16): 2 mg via INTRAVENOUS
  Filled 2021-09-26 (×16): qty 1

## 2021-09-26 MED ORDER — ACETAMINOPHEN 325 MG PO TABS
650.0000 mg | ORAL_TABLET | Freq: Four times a day (QID) | ORAL | Status: DC | PRN
  Administered 2021-09-27 – 2021-09-29 (×5): 650 mg via ORAL
  Filled 2021-09-26 (×5): qty 2

## 2021-09-26 MED ORDER — IPRATROPIUM-ALBUTEROL 0.5-2.5 (3) MG/3ML IN SOLN
3.0000 mL | Freq: Once | RESPIRATORY_TRACT | Status: AC
Start: 2021-09-26 — End: 2021-09-26
  Administered 2021-09-26: 3 mL via RESPIRATORY_TRACT
  Filled 2021-09-26: qty 3

## 2021-09-26 MED ORDER — ENSURE MAX PROTEIN PO LIQD
Freq: Three times a day (TID) | ORAL | Status: DC
  Administered 2021-09-27 – 2021-09-28 (×3): 237 mL via ORAL

## 2021-09-26 MED ORDER — HEPARIN SODIUM (PORCINE) 5000 UNIT/ML IJ SOLN
5000.0000 [IU] | Freq: Three times a day (TID) | INTRAMUSCULAR | Status: DC
  Administered 2021-09-27 – 2021-09-29 (×7): 5000 [IU] via SUBCUTANEOUS
  Filled 2021-09-26 (×7): qty 1

## 2021-09-26 MED ORDER — TRAZODONE HCL 50 MG PO TABS
225.0000 mg | ORAL_TABLET | Freq: Every day | ORAL | Status: DC
  Administered 2021-09-27 – 2021-09-28 (×3): 225 mg via ORAL
  Filled 2021-09-26 (×3): qty 5

## 2021-09-26 MED ORDER — METHYLPREDNISOLONE SODIUM SUCC 125 MG IJ SOLR
120.0000 mg | INTRAMUSCULAR | Status: AC
  Administered 2021-09-27: 120 mg via INTRAVENOUS
  Filled 2021-09-26: qty 2

## 2021-09-26 MED ORDER — METHYLPREDNISOLONE SODIUM SUCC 125 MG IJ SOLR
125.0000 mg | Freq: Once | INTRAMUSCULAR | Status: AC
  Administered 2021-09-26: 125 mg via INTRAVENOUS
  Filled 2021-09-26: qty 2

## 2021-09-26 MED ORDER — ONDANSETRON HCL 4 MG PO TABS: 4.0000 mg | ORAL_TABLET | Freq: Four times a day (QID) | ORAL | Status: DC | PRN

## 2021-09-26 MED ORDER — MIRTAZAPINE 15 MG PO TABS: 15.0000 mg | ORAL_TABLET | Freq: Every day | ORAL | 3 refills | Status: DC

## 2021-09-26 MED ORDER — ONDANSETRON HCL 4 MG/2ML IJ SOLN: 4.0000 mg | Freq: Four times a day (QID) | INTRAMUSCULAR | Status: DC | PRN

## 2021-09-26 MED ORDER — PANTOPRAZOLE SODIUM 40 MG PO TBEC
40.0000 mg | DELAYED_RELEASE_TABLET | Freq: Every day | ORAL | Status: DC
  Administered 2021-09-27 – 2021-09-29 (×3): 40 mg via ORAL
  Filled 2021-09-26 (×3): qty 1

## 2021-09-26 MED ORDER — OMEPRAZOLE 40 MG PO CPDR
40.0000 mg | DELAYED_RELEASE_CAPSULE | Freq: Two times a day (BID) | ORAL | 3 refills | Status: DC
Start: 2021-09-26 — End: 2021-09-28

## 2021-09-26 MED ORDER — IPRATROPIUM-ALBUTEROL 0.5-2.5 (3) MG/3ML IN SOLN
3.0000 mL | Freq: Four times a day (QID) | RESPIRATORY_TRACT | Status: DC
  Administered 2021-09-27 (×4): 3 mL via RESPIRATORY_TRACT
  Filled 2021-09-26 (×4): qty 3

## 2021-09-26 MED ORDER — SODIUM CHLORIDE 0.9 % IV SOLN
500.0000 mg | INTRAVENOUS | Status: AC
  Administered 2021-09-27: 500 mg via INTRAVENOUS
  Filled 2021-09-26: qty 5

## 2021-09-26 MED ORDER — IPRATROPIUM-ALBUTEROL 0.5-2.5 (3) MG/3ML IN SOLN
3.0000 mL | Freq: Four times a day (QID) | RESPIRATORY_TRACT | Status: DC | PRN
Start: 2021-09-26 — End: 2021-09-26

## 2021-09-26 MED ORDER — MOMETASONE FURO-FORMOTEROL FUM 200-5 MCG/ACT IN AERO
2.0000 | INHALATION_SPRAY | Freq: Two times a day (BID) | RESPIRATORY_TRACT | Status: DC
  Administered 2021-09-27 – 2021-09-29 (×5): 2 via RESPIRATORY_TRACT
  Filled 2021-09-26: qty 8.8

## 2021-09-26 MED ORDER — ATORVASTATIN CALCIUM 40 MG PO TABS
40.0000 mg | ORAL_TABLET | Freq: Every evening | ORAL | Status: DC
  Administered 2021-09-27 – 2021-09-28 (×2): 40 mg via ORAL
  Filled 2021-09-26 (×2): qty 1

## 2021-09-26 MED ORDER — ACETAMINOPHEN 650 MG RE SUPP: 650.0000 mg | Freq: Four times a day (QID) | RECTAL | Status: DC | PRN

## 2021-09-26 MED ORDER — OXYCODONE HCL 5 MG PO TABS
5.0000 mg | ORAL_TABLET | ORAL | Status: DC | PRN
  Administered 2021-09-27 – 2021-09-29 (×6): 5 mg via ORAL
  Filled 2021-09-26 (×7): qty 1

## 2021-09-26 MED ORDER — PANCRELIPASE (LIP-PROT-AMYL) 12000-38000 UNITS PO CPEP
24000.0000 [IU] | ORAL_CAPSULE | Freq: Three times a day (TID) | ORAL | Status: DC
  Administered 2021-09-27 – 2021-09-29 (×8): 24000 [IU] via ORAL
  Filled 2021-09-26 (×8): qty 2

## 2021-09-26 MED ORDER — ALBUTEROL SULFATE (2.5 MG/3ML) 0.083% IN NEBU: 2.5000 mg | INHALATION_SOLUTION | RESPIRATORY_TRACT | Status: DC | PRN

## 2021-09-26 MED ORDER — IOHEXOL 350 MG/ML SOLN
100.0000 mL | Freq: Once | INTRAVENOUS | Status: AC | PRN
  Administered 2021-09-26: 75 mL via INTRAVENOUS

## 2021-09-26 MED ORDER — TIOTROPIUM BROMIDE MONOHYDRATE 2.5 MCG/ACT IN AERS: 2.0000 | INHALATION_SPRAY | Freq: Every day | RESPIRATORY_TRACT | Status: DC

## 2021-09-26 MED ORDER — PREDNISONE 20 MG PO TABS
40.0000 mg | ORAL_TABLET | Freq: Every day | ORAL | Status: DC
  Administered 2021-09-28 – 2021-09-29 (×2): 40 mg via ORAL
  Filled 2021-09-26 (×2): qty 2

## 2021-09-26 MED ORDER — OXYCODONE HCL ER 15 MG PO T12A
15.0000 mg | EXTENDED_RELEASE_TABLET | Freq: Two times a day (BID) | ORAL | Status: DC
  Administered 2021-09-27 – 2021-09-29 (×6): 15 mg via ORAL
  Filled 2021-09-26 (×6): qty 1

## 2021-09-26 MED ORDER — AMLODIPINE BESYLATE 5 MG PO TABS
10.0000 mg | ORAL_TABLET | Freq: Every day | ORAL | Status: DC
  Administered 2021-09-27 – 2021-09-29 (×3): 10 mg via ORAL
  Filled 2021-09-26 (×3): qty 2

## 2021-09-26 MED ORDER — BISOPROLOL FUMARATE 5 MG PO TABS
2.5000 mg | ORAL_TABLET | Freq: Every day | ORAL | Status: DC
  Administered 2021-09-27 – 2021-09-29 (×3): 2.5 mg via ORAL
  Filled 2021-09-26 (×3): qty 1

## 2021-09-26 NOTE — ED Provider Notes (Signed)
[facility] Provider Note   CSN: [id number] Arrival date & time: 09/26/21  1728     History {Add pertinent medical, surgical, social history, OB history to HPI:1} Chief Complaint  Patient presents with   Shortness of Breath    Jasmine [name] is a 66 y.o. female.  She has a history of COPD and is on 5 L nasal cannula 24/7.  She continues to smoke.  She said she was admitted earlier this month for pneumonia and completed her antibiotics about 10 days ago.  For the last week she has had increased shortness of breath again.  Dyspnea on exertion.  Cough productive of some clear and yellow sputum.  No fevers or chills no nausea vomiting.  No hemoptysis.  She saw her primary care doctor who got a chest x-ray today and given a breathing treatment and sent here for further evaluation.  The history is provided by the patient.  Shortness of Breath Severity:  Moderate Onset quality:  Gradual Duration:  1 week Timing:  Constant Progression:  Unchanged Chronicity:  Recurrent Relieved by:  Nothing Worsened by:  Activity and coughing Ineffective treatments:  Inhaler, oxygen and rest Associated symptoms: cough, sputum production and wheezing   Associated symptoms: no chest pain, no fever, no hemoptysis, no rash and no vomiting   Risk factors: tobacco use        Home Medications Prior to Admission medications   Medication Sig Start Date End Date Taking? Authorizing Provider  albuterol (VENTOLIN HFA) 108 (90 Base) MCG/ACT inhaler Inhale 2 puffs into the lungs every 6 (six) hours as needed for wheezing or shortness of breath. 09/15/21   Janora Norlander, DO  amLODipine (NORVASC) 10 MG tablet Take 1 tablet (10 mg total) by mouth daily. 09/26/21   Janora Norlander, DO  atorvastatin (LIPITOR) 40 MG tablet Take 1 tablet (40 mg total) by mouth daily. 07/09/20   Janora Norlander, DO  bisoprolol (ZEBETA) 5 MG tablet Take 0.5 tablets (2.5 mg total) by mouth daily. Take in  Place of Metoprolol 08/07/21   Roxan Hockey, MD  Cholecalciferol 125 MCG (5000 UT) capsule Take 5,000 Units by mouth daily.    [provider]  dextromethorphan (DELSYM) 30 MG/5ML liquid Take 15 mg by mouth as needed for cough.    [provider]  diclofenac Sodium (VOLTAREN) 1 % GEL Apply 2 g topically 4 (four) times daily. 09/05/21   [provider]  fluticasone-salmeterol (ADVAIR DISKUS) 250-50 MCG/ACT AEPB Inhale 1 puff into the lungs in the morning and at bedtime. 08/07/21   Roxan Hockey, MD  lipase/protease/amylase 24000-76000 units CPEP Take 2 caps daily with meals and 1 cap with snacks Patient taking differently: Take 1-2 capsules by mouth See admin instructions. Take 2 capsules by mouth daily with meals; and take 1 capsule by mouth with snacks. 07/03/21   Johnson, Clanford L, MD  mirtazapine (REMERON) 15 MG tablet Take 1 tablet (15 mg total) by mouth at bedtime. 09/26/21   Janora Norlander, DO  Nutritional Supplements (ENSURE HIGH PROTEIN) LIQD Drink 1 can 3 times daily. 08/30/20   Janora Norlander, DO  omeprazole (PRILOSEC) 40 MG capsule Take 1 capsule (40 mg total) by mouth in the morning and at bedtime. Take one capsule once to twice daily before a meal. 09/26/21   Ronnie Doss M, DO  oxybutynin (DITROPAN-XL) 5 MG 24 hr tablet TAKE 1 TABLET AT BEDTIME Patient taking differently: Take 5 mg by mouth at bedtime.  09/05/21   Janora Norlander, DO  oxyCODONE-acetaminophen (PERCOCET) 10-325 MG tablet Take 1 tablet by mouth 5 (five) times daily. 08/27/20   [provider]  OXYCONTIN 15 MG 12 hr tablet Take 15 mg by mouth every 12 (twelve) hours. 08/28/20   [provider]  OXYGEN Inhale 4 L into the lungs See admin instructions. continuous    [provider]  prochlorperazine (COMPAZINE) 25 MG suppository Place 1 suppository (25 mg total) rectally every 12 (twelve) hours as needed for nausea or vomiting. 08/07/21   Roxan Hockey, MD   Tiotropium Bromide Monohydrate (SPIRIVA RESPIMAT) 2.5 MCG/ACT AERS Inhale 2 puffs into the lungs daily. 08/07/21   Roxan Hockey, MD  tiZANidine (ZANAFLEX) 4 MG capsule Take 1 capsule (4 mg total) by mouth 3 (three) times daily as needed for muscle spasms. 09/13/21   Barton Dubois, MD  traZODone (DESYREL) 150 MG tablet TAKE 1 TO 2 TABLETS AT BEDTIME Patient taking differently: Take 150 mg by mouth at bedtime. 03/30/21   Janora Norlander, DO      Allergies    Lyrica [pregabalin], Darvon [propoxyphene], Gabapentin, Augmentin [amoxicillin-pot clavulanate], Erythromycin, and Vibramycin [doxycycline calcium]    Review of Systems   Review of Systems  Constitutional:  Negative for fever.  Respiratory:  Positive for cough, sputum production, shortness of breath and wheezing. Negative for hemoptysis.   Cardiovascular:  Negative for chest pain.  Gastrointestinal:  Negative for diarrhea, nausea and vomiting.  Skin:  Negative for rash.    Physical Exam Updated Vital Signs BP 130/70   Pulse 97   Temp 98.1 F (36.7 C) (Oral)   Resp 14   Ht 5\' 3"  (1.6 m)   Wt 46.3 kg   SpO2 100%   BMI 18.07 kg/m  Physical Exam Vitals and nursing note reviewed.  Constitutional:      General: She is not in acute distress.    Appearance: She is well-developed.  HENT:     Head: Normocephalic and atraumatic.  Eyes:     Conjunctiva/sclera: Conjunctivae normal.  Cardiovascular:     Rate and Rhythm: Normal rate and regular rhythm.     Heart sounds: No murmur heard. Pulmonary:     Effort: Accessory muscle usage present. No respiratory distress.     Breath sounds: Decreased breath sounds present.  Abdominal:     Palpations: Abdomen is soft.     Tenderness: There is no abdominal tenderness.  Musculoskeletal:        General: No swelling.     Cervical back: Neck supple.     Right lower leg: No tenderness. No edema.     Left lower leg: No tenderness. No edema.  Skin:    General: Skin is warm and dry.      Capillary Refill: Capillary refill takes less than 2 seconds.  Neurological:     General: No focal deficit present.     Mental Status: She is alert.     ED Results / Procedures / Treatments   Labs (all labs ordered are listed, but only abnormal results are displayed) Labs Reviewed - No data to display  EKG None  Radiology DG Chest 2 View  Result Date: 09/26/2021 CLINICAL DATA:  worsening SOB, recent recurrent CAP RLL EXAM: CHEST - 2 VIEW COMPARISON:  September 12, 2021 FINDINGS: The heart size and mediastinal contours are within normal limits with stable atheromatous calcifications of the arch of the aorta. Again seen are the post right upper lobectomy changes with some  volume loss and mild rightward deviation of the trachea. There is some scarring seen at the right perihilar and right upper thorax. There has been interval resolution of the patchy opacity in the right upper lung. Emphysema and interstitial changes. No consolidation, pleural effusion or vascular congestion. Stable spinal stimulator. The visualized skeletal structures are unremarkable. IMPRESSION: There has been interval significant resolution of the opacity in the right upper lung. There is some scarring seen at the right pulmonary apex and right perihilar area. Emphysema and interstitial changes. Electronically Signed   By: Frazier Richards M.D.   On: 09/26/2021 16:30    Procedures Procedures  {Document cardiac monitor, telemetry assessment procedure when appropriate:1}  Medications Ordered in ED Medications  magnesium sulfate IVPB 2 g 50 mL (has no administration in time range)  ipratropium-albuterol (DUONEB) 0.5-2.5 (3) MG/3ML nebulizer solution 3 mL (has no administration in time range)  methylPREDNISolone sodium succinate (SOLU-MEDROL) 125 mg/2 mL injection 125 mg (has no administration in time range)    ED Course/ Medical Decision Making/ A&P                           Medical Decision Making Amount and/or Complexity  of Data Reviewed Labs: ordered.  Risk Prescription drug management.  Jasmine [name] was evaluated in Emergency Department on 09/26/2021 for the symptoms described in the history of present illness. She was evaluated in the context of the global COVID-19 pandemic, which necessitated consideration that the patient might be at risk for infection with the SARS-CoV-2 virus that causes COVID-19. Institutional protocols and algorithms that pertain to the evaluation of patients at risk for COVID-19 are in a state of rapid change based on information released by regulatory bodies including the CDC and federal and state organizations. These policies and algorithms were followed during the patient's care in the ED.  This patient complains of ***; this involves an extensive number of treatment Options and is a complaint that carries with it a high risk of complications and morbidity. The differential includes ***  I ordered, reviewed and interpreted labs, which included *** I ordered medication *** and reviewed PMP when indicated. I ordered imaging studies which included *** and I independently    visualized and interpreted imaging which showed *** Additional history obtained from *** Previous records obtained and reviewed *** I consulted *** and discussed lab and imaging findings and discussed disposition.  Cardiac monitoring reviewed, *** Social determinants considered, *** Critical Interventions: ***  After the interventions stated above, I reevaluated the patient and found *** Admission and further testing considered, ***    {Document critical care time when appropriate:1} {Document review of labs and clinical decision tools ie heart score, Chads2Vasc2 etc:1}  {Document your independent review of radiology images, and any outside records:1} {Document your discussion with family members, caretakers, and with consultants:1} {Document social determinants of health affecting pt's  care:1} {Document your decision making why or why not admission, treatments were needed:1} Final Clinical Impression(s) / ED Diagnoses Final diagnoses:  None    Rx / DC Orders ED Discharge Orders     None

## 2021-09-26 NOTE — ED Triage Notes (Signed)
Pt BIBA from dr's office. Pt recently d/c from hospital. Pt finished abx 1 week ago for PNA, which has not improved.  Pt wears 5L at all times.  126/71 106 HR 96% on 5L

## 2021-09-26 NOTE — Progress Notes (Signed)
Subjective: CC: Hospital discharge follow up PCP: Janora Norlander, DO AST:Jasmine [name] is a 66 y.o. female presenting to clinic today for:  1.  Acute on chronic respiratory failure in the setting of right lower lobe pneumonia Pulm scheduled 10/19/2021.  Comes in today feeling worse.  She was feeling better after abx in the hospital (discharged on zpak and cefdinir) but notes that shortly after discontinuing the antibiotic she started feeling bad again. Last neb yesterday.  Using albuterol frequently.  Feels like she cannot catch her breath.  She has been having to sit up in her chair to even breathe at home.  Normally uses 5 L of oxygen via nasal cannula in efforts to maintain O2 above 96, which is recommended by her pulmonologist.  She has been having to increase her O2 to 6 L in efforts to get any breath.  Continues to have right lower lung pain.  No fevers reported.  ROS: Per HPI  Allergies  Allergen Reactions   Lyrica [Pregabalin] Other (See Comments)    EXTREME SHAKING/TREMBLING   Darvon [Propoxyphene]     UNSPECIFIED REACTION    Gabapentin     Extreme shaking and trembling    Augmentin [Amoxicillin-Pot Clavulanate] Nausea And Vomiting    Has patient had a PCN reaction causing immediate rash, facial/tongue/throat swelling, SOB or lightheadedness with hypotension:no Has patient had a PCN reaction causing severe rash involving mucus membranes or skin necrosis:No Has patient had a PCN reaction that required hospitalization:No Has patient had a PCN reaction occurring within the last 10 years:Yes--ONLY N/V If all of the above answers are "NO", then may proceed with Cephalosporin use.    Erythromycin Itching and Rash        Vibramycin [Doxycycline Calcium] Itching and Rash    "[facility] "   Past Medical History:  Diagnosis Date   Allergy    Anemia    Anxiety    Arthritis    Asthma    Carpal tunnel syndrome    Chronic back pain    Cigarette smoker 2/97/9892    Complication of anesthesia    in the past has had N/V, the last few surgeries she has not been   COPD (chronic obstructive pulmonary disease) ([place])    Easy bruising 07/10/2016   GERD (gastroesophageal reflux disease)    Headache    Heart murmur 2005   never had any problems   History of blood transfusion 2018   History of kidney stones    Hypertension    Hypoglycemia    Hypoglycemia    Iron deficiency anemia 07/11/2016   Neuropathy    both arms   Normocytic anemia 07/29/2015   Pancreatitis    Pneumonia    PONV (postoperative nausea and vomiting)    Postoperative anemia due to acute blood loss 11/15/2015   R lung cancer    Sciatica    Seizures ([place])    as a child when she would have an asthma attack. none as an adult   Shortness of breath dyspnea     Current Outpatient Medications:    albuterol (VENTOLIN HFA) 108 (90 Base) MCG/ACT inhaler, Inhale 2 puffs into the lungs every 6 (six) hours as needed for wheezing or shortness of breath., Disp: 18 g, Rfl: 0   amLODipine (NORVASC) 10 MG tablet, Take 1 tablet (10 mg total) by mouth daily., Disp: 30 tablet, Rfl: 5   atorvastatin (LIPITOR) 40 MG tablet, Take 1 tablet (40 mg total) by mouth daily., Disp:  90 tablet, Rfl: 3   bisoprolol (ZEBETA) 5 MG tablet, Take 0.5 tablets (2.5 mg total) by mouth daily. Take in Place of Metoprolol, Disp: 45 tablet, Rfl: 2   Cholecalciferol 125 MCG (5000 UT) capsule, Take 5,000 Units by mouth daily., Disp: , Rfl:    dextromethorphan (DELSYM) 30 MG/5ML liquid, Take 15 mg by mouth as needed for cough., Disp: , Rfl:    diclofenac Sodium (VOLTAREN) 1 % GEL, Apply 2 g topically 4 (four) times daily., Disp: , Rfl:    fluticasone-salmeterol (ADVAIR DISKUS) 250-50 MCG/ACT AEPB, Inhale 1 puff into the lungs in the morning and at bedtime., Disp: 180 each, Rfl: 3   lipase/protease/amylase 24000-76000 units CPEP, Take 2 caps daily with meals and 1 cap with snacks (Patient taking differently: Take 1-2 capsules by mouth See  admin instructions. Take 2 capsules by mouth daily with meals; and take 1 capsule by mouth with snacks.), Disp: 270 capsule, Rfl: 2   mirtazapine (REMERON) 7.5 MG tablet, Take 1 tablet (7.5 mg total) by mouth at bedtime. Cross taper with 30mg  of Cymbalta for 3-4 weeks. Then stop cymbalta, Disp: 30 tablet, Rfl: 1   Nutritional Supplements (ENSURE HIGH PROTEIN) LIQD, Drink 1 can 3 times daily., Disp: 21330 mL, Rfl: PRN   omeprazole (PRILOSEC) 40 MG capsule, Take one capsule once to twice daily before a meal. (Patient taking differently: Take 40 mg by mouth in the morning and at bedtime. Take one capsule once to twice daily before a meal.), Disp: 90 capsule, Rfl: 3   oxybutynin (DITROPAN-XL) 5 MG 24 hr tablet, TAKE 1 TABLET AT BEDTIME (Patient taking differently: Take 5 mg by mouth at bedtime.), Disp: 90 tablet, Rfl: 3   oxyCODONE-acetaminophen (PERCOCET) 10-325 MG tablet, Take 1 tablet by mouth 5 (five) times daily., Disp: , Rfl:    OXYCONTIN 15 MG 12 hr tablet, Take 15 mg by mouth every 12 (twelve) hours., Disp: , Rfl:    OXYGEN, Inhale 4 L into the lungs See admin instructions. continuous, Disp: , Rfl:    prochlorperazine (COMPAZINE) 25 MG suppository, Place 1 suppository (25 mg total) rectally every 12 (twelve) hours as needed for nausea or vomiting., Disp: 12 suppository, Rfl: 3   Tiotropium Bromide Monohydrate (SPIRIVA RESPIMAT) 2.5 MCG/ACT AERS, Inhale 2 puffs into the lungs daily., Disp: 4 g, Rfl: 3   tiZANidine (ZANAFLEX) 4 MG capsule, Take 1 capsule (4 mg total) by mouth 3 (three) times daily as needed for muscle spasms., Disp: 30 capsule, Rfl: 1   traZODone (DESYREL) 150 MG tablet, TAKE 1 TO 2 TABLETS AT BEDTIME (Patient taking differently: Take 150 mg by mouth at bedtime.), Disp: 180 tablet, Rfl: 3 Social History   Socioeconomic History   Marital status: Married    Spouse name: Jasmine [name]   Number of children: 1   Years of education: Not on file   Highest education level: Some college, no degree   Occupational History   Occupation: disability  Tobacco Use   Smoking status: Every Day    Packs/day: 0.25    Years: 35.00    Total pack years: 8.75    Types: Cigarettes   Smokeless tobacco: Never   Tobacco comments:    trying to quit  Vaping Use   Vaping Use: Never used  Substance and Sexual Activity   Alcohol use: Yes    Alcohol/week: 2.0 standard drinks of alcohol    Types: 2 Glasses of wine per week    Comment: occasionally   Drug use: No  Sexual activity: Yes    Birth control/protection: None  Other Topics Concern   Not on file  Social History Narrative   Retired, lives at home with husband Jasmine [name] and 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 1 son, deceased. 2 pet dogs. Enjoys watching TV.   Husband takes care of cooking, cleaning, driving, and helps her with ADLs including bathing   Social Determinants of Health   Financial Resource Strain: Low Risk  (04/01/2021)   Overall Financial Resource Strain (CARDIA)    Difficulty of Paying Living Expenses: Not very hard  Food Insecurity: No Food Insecurity (04/01/2021)   Hunger Vital Sign    Worried About Running Out of Food in the Last Year: Never true    Ran Out of Food in the Last Year: Never true  Transportation Needs: No Transportation Needs (04/01/2021)   PRAPARE - Hydrologist (Medical): No    Lack of Transportation (Non-Medical): No  Physical Activity: Insufficiently Active (04/01/2021)   Exercise Vital Sign    Days of Exercise per Week: 7 days    Minutes of Exercise per Session: 10 min  Stress: No Stress Concern Present (04/01/2021)   [place]    Feeling of Stress : Not at all  Social Connections: [place] (04/01/2021)   Social Connection and Isolation Panel [NHANES]    Frequency of Communication with Friends and Family: More than three times a week    Frequency of Social Gatherings with Friends and Family: More than three times a week     Attends Religious Services: 1 to 4 times per year    Active Member of [employer] or Organizations: Yes    Attends Archivist Meetings: 1 to 4 times per year    Marital Status: Married  Human resources officer Violence: Not At Risk (04/01/2021)   Humiliation, Afraid, Rape, and Kick questionnaire    Fear of Current or Ex-Partner: No    Emotionally Abused: No    Physically Abused: No    Sexually Abused: No   Family History  Problem Relation Age of Onset   Heart disease Mother    Diabetes Mother    Hypertension Mother    Hyperlipidemia Mother    COPD Mother        smoked   Cancer Mother        bile duct   COPD Father    Diabetes Father    Heart disease Father    Cancer Father        throat   Dementia Father    Asthma Son    Hypertension Sister    Hyperlipidemia Sister    Hypertension Brother    Hyperlipidemia Brother    Hypertension Brother    Hyperlipidemia Brother    Emphysema Maternal Grandmother        smoked   Colon cancer Neg Hx    Colon polyps Neg Hx     Objective: Office vital signs reviewed. Resp (!) 30   Ht 5\' 3"  (1.6 m)   SpO2 96% Comment: 6L via nasal cannula  BMI 16.52 kg/m   Physical Examination:  General: Awake, alert, appears ill, diaphoretic Cardio: regular rate and rhythm, S1S2 heard, no murmurs appreciated Pulm: Belly breathing, dyspneic with speech.  Globally decreased breath sounds with decreased breath sounds in the left middle lung fields.  DG Chest 2 View  Result Date: 09/26/2021 CLINICAL DATA:  worsening SOB, recent recurrent CAP RLL EXAM: CHEST - 2 VIEW  COMPARISON:  September 12, 2021 FINDINGS: The heart size and mediastinal contours are within normal limits with stable atheromatous calcifications of the arch of the aorta. Again seen are the post right upper lobectomy changes with some volume loss and mild rightward deviation of the trachea. There is some scarring seen at the right perihilar and right upper thorax. There has been interval  resolution of the patchy opacity in the right upper lung. Emphysema and interstitial changes. No consolidation, pleural effusion or vascular congestion. Stable spinal stimulator. The visualized skeletal structures are unremarkable. IMPRESSION: There has been interval significant resolution of the opacity in the right upper lung. There is some scarring seen at the right pulmonary apex and right perihilar area. Emphysema and interstitial changes. Electronically Signed   By: Frazier Richards M.D.   On: 09/26/2021 16:30    Assessment/ Plan: 66 y.o. female   Community acquired pneumonia of right lower lobe of lung - Plan: DG Chest [facility] discharge follow-up  Sleep difficulties - Plan: mirtazapine (REMERON) 15 MG tablet  Depression, major, single episode, moderate (HCC) - Plan: mirtazapine (REMERON) 15 MG tablet  Anxiety about health - Plan: mirtazapine (REMERON) 15 MG tablet  Acute on chronic respiratory failure with hypoxia (HCC)  Was being seen for follow-up on community-acquired right lower lobe pneumonia which according to the chest x-ray actually shows to be improving but she was quite dyspneic on exam with visible belly breathing and dyspnea with speech even on 6 L of oxygen via nasal cannula.  We administered a DuoNeb here in office which did not improve her symptoms nor her breath sounds.  I do question possible hypercapnia since her O2 was technically normal.  She was sent to [facility] via ambulance  No orders of the defined types were placed in this encounter.  No orders of the defined types were placed in this encounter.   Today's visit is for Transitional Care Management.  The patient was discharged from [facility] on 09/13/2021 with a primary diagnosis of CAP.   Contact with the patient and/or caregiver, by a clinical staff member, was made on 09/14/2021 and was documented as a telephone encounter within the EMR.  Through chart review and discussion with the  patient I have determined that management of their condition is of HIGH complexity.    Janora Norlander, DO [place] [phone]

## 2021-09-27 ENCOUNTER — Other Ambulatory Visit: Payer: Self-pay

## 2021-09-27 DIAGNOSIS — Z9981 Dependence on supplemental oxygen: Secondary | ICD-10-CM | POA: Diagnosis not present

## 2021-09-27 DIAGNOSIS — J9611 Chronic respiratory failure with hypoxia: Secondary | ICD-10-CM | POA: Diagnosis present

## 2021-09-27 DIAGNOSIS — K861 Other chronic pancreatitis: Secondary | ICD-10-CM | POA: Diagnosis present

## 2021-09-27 DIAGNOSIS — Z800 Family history of malignant neoplasm of digestive organs: Secondary | ICD-10-CM | POA: Diagnosis not present

## 2021-09-27 DIAGNOSIS — I10 Essential (primary) hypertension: Secondary | ICD-10-CM | POA: Diagnosis present

## 2021-09-27 DIAGNOSIS — Z902 Acquired absence of lung [part of]: Secondary | ICD-10-CM | POA: Diagnosis not present

## 2021-09-27 DIAGNOSIS — Z825 Family history of asthma and other chronic lower respiratory diseases: Secondary | ICD-10-CM | POA: Diagnosis not present

## 2021-09-27 DIAGNOSIS — G894 Chronic pain syndrome: Secondary | ICD-10-CM | POA: Diagnosis present

## 2021-09-27 DIAGNOSIS — F17210 Nicotine dependence, cigarettes, uncomplicated: Secondary | ICD-10-CM | POA: Diagnosis present

## 2021-09-27 DIAGNOSIS — Z515 Encounter for palliative care: Secondary | ICD-10-CM | POA: Diagnosis not present

## 2021-09-27 DIAGNOSIS — R911 Solitary pulmonary nodule: Secondary | ICD-10-CM | POA: Diagnosis present

## 2021-09-27 DIAGNOSIS — Z7189 Other specified counseling: Secondary | ICD-10-CM | POA: Diagnosis not present

## 2021-09-27 DIAGNOSIS — Z85118 Personal history of other malignant neoplasm of bronchus and lung: Secondary | ICD-10-CM | POA: Diagnosis not present

## 2021-09-27 DIAGNOSIS — Z90710 Acquired absence of both cervix and uterus: Secondary | ICD-10-CM | POA: Diagnosis not present

## 2021-09-27 DIAGNOSIS — K219 Gastro-esophageal reflux disease without esophagitis: Secondary | ICD-10-CM | POA: Diagnosis present

## 2021-09-27 DIAGNOSIS — Z20822 Contact with and (suspected) exposure to covid-19: Secondary | ICD-10-CM | POA: Diagnosis present

## 2021-09-27 DIAGNOSIS — Z808 Family history of malignant neoplasm of other organs or systems: Secondary | ICD-10-CM | POA: Diagnosis not present

## 2021-09-27 DIAGNOSIS — Z833 Family history of diabetes mellitus: Secondary | ICD-10-CM | POA: Diagnosis not present

## 2021-09-27 DIAGNOSIS — J441 Chronic obstructive pulmonary disease with (acute) exacerbation: Secondary | ICD-10-CM

## 2021-09-27 DIAGNOSIS — E785 Hyperlipidemia, unspecified: Secondary | ICD-10-CM | POA: Diagnosis present

## 2021-09-27 DIAGNOSIS — Z8349 Family history of other endocrine, nutritional and metabolic diseases: Secondary | ICD-10-CM | POA: Diagnosis not present

## 2021-09-27 DIAGNOSIS — R0602 Shortness of breath: Secondary | ICD-10-CM | POA: Diagnosis present

## 2021-09-27 DIAGNOSIS — Z923 Personal history of irradiation: Secondary | ICD-10-CM | POA: Diagnosis not present

## 2021-09-27 DIAGNOSIS — G629 Polyneuropathy, unspecified: Secondary | ICD-10-CM | POA: Diagnosis present

## 2021-09-27 DIAGNOSIS — J189 Pneumonia, unspecified organism: Secondary | ICD-10-CM | POA: Diagnosis present

## 2021-09-27 DIAGNOSIS — F419 Anxiety disorder, unspecified: Secondary | ICD-10-CM | POA: Diagnosis present

## 2021-09-27 DIAGNOSIS — Z8249 Family history of ischemic heart disease and other diseases of the circulatory system: Secondary | ICD-10-CM | POA: Diagnosis not present

## 2021-09-27 LAB — COMPREHENSIVE METABOLIC PANEL
ALT: 26 U/L (ref 0–44)
AST: 54 U/L — ABNORMAL HIGH (ref 15–41)
Albumin: 3.5 g/dL (ref 3.5–5.0)
Alkaline Phosphatase: 79 U/L (ref 38–126)
Anion gap: 8 (ref 5–15)
BUN: 21 mg/dL (ref 8–23)
CO2: 28 mmol/L (ref 22–32)
Calcium: 8.9 mg/dL (ref 8.9–10.3)
Chloride: 101 mmol/L (ref 98–111)
Creatinine, Ser: 0.65 mg/dL (ref 0.44–1.00)
GFR, Estimated: >60 mL/min (ref >60)
Glucose, Bld: 169 mg/dL — ABNORMAL HIGH (ref 70–99)
Potassium: 4.7 mmol/L (ref 3.5–5.1)
Sodium: 137 mmol/L (ref 135–145)
Total Bilirubin: 0.5 mg/dL (ref 0.3–1.2)
Total Protein: 7 g/dL (ref 6.5–8.1)

## 2021-09-27 LAB — CBC WITH DIFFERENTIAL/PLATELET
Abs Immature Granulocytes: 0.06 10*3/uL (ref 0.00–0.07)
Basophils Absolute: 0 10*3/uL (ref 0.0–0.1)
Basophils Relative: 0 %
Eosinophils Absolute: 0 10*3/uL (ref 0.0–0.5)
Eosinophils Relative: 0 %
HCT: 34 % — ABNORMAL LOW (ref 36.0–46.0)
Hemoglobin: 10.5 g/dL — ABNORMAL LOW (ref 12.0–15.0)
Immature Granulocytes: 1 %
Lymphocytes Relative: 14 %
Lymphs Abs: 1.1 10*3/uL (ref 0.7–4.0)
MCH: 32.7 pg (ref 26.0–34.0)
MCHC: 30.9 g/dL (ref 30.0–36.0)
MCV: 105.9 fL — ABNORMAL HIGH (ref 80.0–100.0)
Monocytes Absolute: 0.2 10*3/uL (ref 0.1–1.0)
Monocytes Relative: 3 %
Neutro Abs: 6.1 10*3/uL (ref 1.7–7.7)
Neutrophils Relative %: 82 %
Platelets: 256 10*3/uL (ref 150–400)
RBC: 3.21 MIL/uL — ABNORMAL LOW (ref 3.87–5.11)
RDW: 13.6 % (ref 11.5–15.5)
WBC: 7.4 10*3/uL (ref 4.0–10.5)
nRBC: 0 % (ref 0.0–0.2)

## 2021-09-27 LAB — MAGNESIUM: Magnesium: 2.2 mg/dL (ref 1.7–2.4)

## 2021-09-27 MED ORDER — GUAIFENESIN ER 600 MG PO TB12
1200.0000 mg | ORAL_TABLET | Freq: Two times a day (BID) | ORAL | Status: DC
  Administered 2021-09-27 – 2021-09-29 (×6): 1200 mg via ORAL
  Filled 2021-09-27 (×6): qty 2

## 2021-09-27 MED ORDER — TIZANIDINE HCL 4 MG PO TABS
6.0000 mg | ORAL_TABLET | Freq: Three times a day (TID) | ORAL | Status: DC
  Administered 2021-09-27 – 2021-09-29 (×6): 6 mg via ORAL
  Filled 2021-09-27 (×6): qty 1

## 2021-09-27 MED ORDER — IPRATROPIUM-ALBUTEROL 0.5-2.5 (3) MG/3ML IN SOLN
3.0000 mL | Freq: Two times a day (BID) | RESPIRATORY_TRACT | Status: DC
  Administered 2021-09-28 – 2021-09-29 (×3): 3 mL via RESPIRATORY_TRACT
  Filled 2021-09-27 (×3): qty 3

## 2021-09-27 MED ORDER — UMECLIDINIUM BROMIDE 62.5 MCG/ACT IN AEPB
1.0000 | INHALATION_SPRAY | Freq: Every day | RESPIRATORY_TRACT | Status: DC
  Administered 2021-09-27 – 2021-09-29 (×3): 1 via RESPIRATORY_TRACT
  Filled 2021-09-27: qty 7

## 2021-09-27 NOTE — H&P (Signed)
History and Physical    Patient: Jasmine [name] PYP:[id number] DOB: 1956/01/07 DOA: 09/26/2021 DOS: the patient was seen and examined on 09/27/2021 PCP: Janora Norlander, DO  Patient coming from: Home  Chief Complaint:  Chief Complaint  Patient presents with   Shortness of Breath   HPI: Jasmine [name] is a 66 y.o. female with medical history significant of allergies, anemia, COPD, smoker, GERD, hypertension, lung cancer, and more presents ED with a chief complaint of dyspnea.  Patient reports she had worsening dyspnea over the last 1 week.  Its been so bad that she had to sleep sitting up in her recliner.  Changing position is enough to make her shortness of breath worse.  She went to her PCP today who said that her breathing was so labored that she had to come into the ER.  A DuoNeb was given there in the PCP office and the patient was transported via EMS to the ED.  Patient denies any fevers.  Patient reports she been coughing but no more than normal.  Its been productive of yellow mucus.  Patient has been on her 5 L nasal cannula with out any attempted adjustments to her oxygen supplementation at home.  Patient reports she has been using her nebulizer every 4-6 hours.  She has had no chest pain.  Patient has no dysuria, hematuria, diarrhea, other complaints on review of systems.  Patient reports she currently smokes less than 5 cigarettes/day.  She has been up to 5 cigarettes/day for 4-6 months.  Encouraged to completely abstain.  She drinks once per month.  She does not use illicit drugs.  She is vaccinated for COVID. Review of Systems: As mentioned in the history of present illness. All other systems reviewed and are negative. Past Medical History:  Diagnosis Date   Allergy    Anemia    Anxiety    Arthritis    Asthma    Carpal tunnel syndrome    Chronic back pain    Cigarette smoker 2/45/8099   Complication of anesthesia    in the past has had N/V, the last few surgeries she has  not been   COPD (chronic obstructive pulmonary disease) ([place])    Easy bruising 07/10/2016   GERD (gastroesophageal reflux disease)    Headache    Heart murmur 2005   never had any problems   History of blood transfusion 2018   History of kidney stones    Hypertension    Hypoglycemia    Hypoglycemia    Iron deficiency anemia 07/11/2016   Neuropathy    both arms   Normocytic anemia 07/29/2015   Pancreatitis    Pneumonia    PONV (postoperative nausea and vomiting)    Postoperative anemia due to acute blood loss 11/15/2015   R lung cancer    Sciatica    Seizures ([place])    as a child when she would have an asthma attack. none as an adult   Shortness of breath dyspnea    Past Surgical History:  Procedure Laterality Date   ABDOMINAL HYSTERECTOMY  8338   APPLICATION OF ROBOTIC ASSISTANCE FOR SPINAL PROCEDURE N/A 09/05/2016   Procedure: APPLICATION OF ROBOTIC ASSISTANCE FOR SPINAL PROCEDURE;  Surgeon: Consuella Lose, MD;  Location: [facility];  Service: Neurosurgery;  Laterality: N/A;   BACK SURGERY     BIOPSY  06/14/2020   Procedure: BIOPSY;  Surgeon: Daneil Dolin, MD;  [facility];  Service: Endoscopy;;   CERVICAL FUSION  CESAREAN SECTION  1982   COLONOSCOPY     unsure last   COLONOSCOPY WITH PROPOFOL N/A 06/14/2020   Procedure: COLONOSCOPY WITH PROPOFOL;  Surgeon: Daneil Dolin, MD;  [facility];  Service: Endoscopy;  Laterality: N/A;  PM   cyst removal face     disk repair     neck and lumbar region   ESOPHAGOGASTRODUODENOSCOPY (EGD) WITH PROPOFOL N/A 06/14/2020   Procedure: ESOPHAGOGASTRODUODENOSCOPY (EGD) WITH PROPOFOL;  Surgeon: Daneil Dolin, MD;  [facility];  Service: Endoscopy;  Laterality: N/A;   FOOT SURGERY Bilateral    to file bones down    HARDWARE REMOVAL N/A 09/05/2016   Procedure: HARDWARE REMOVAL AND REPLACEMENT OF LUMBAR FIVE SCREWS;  Surgeon: Consuella Lose, MD;  Location: [place];  Service: Neurosurgery;   Laterality: N/A;   IMPLANTATION / PLACEMENT EPIDURAL NEUROSTIMULATOR ELECTRODES     INTERCOSTAL NERVE BLOCK Right 07/29/2020   Procedure: INTERCOSTAL NERVE BLOCK;  Surgeon: Melrose Nakayama, MD;  Location: [facility];  Service: Thoracic;  Laterality: Right;   LAMINECTOMY     2006   lobectomy Right 07/2020   LUMBAR FUSION     LYMPH NODE BIOPSY Right 07/29/2020   Procedure: LYMPH NODE BIOPSY;  Surgeon: Melrose Nakayama, MD;  Location: [place];  Service: Thoracic;  Laterality: Right;   POLYPECTOMY  06/14/2020   Procedure: POLYPECTOMY INTESTINAL;  Surgeon: Daneil Dolin, MD;  [facility];  Service: Endoscopy;;   SPINAL CORD STIMULATOR BATTERY EXCHANGE     SPINAL CORD STIMULATOR INSERTION N/A 07/05/2016   Procedure: REPLACEMENT OF LUMBAR SPINAL CORD STIMULATOR BATTERY;  Surgeon: Newman Pies, MD;  Location: [place];  Service: Neurosurgery;  Laterality: N/A;   TONSILLECTOMY     Social History:  reports that she has been smoking cigarettes. She has a 8.75 pack-year smoking history. She has never used smokeless tobacco. She reports current alcohol use of about 2.0 standard drinks of alcohol per week. She reports that she does not use drugs.  Allergies  Allergen Reactions   Lyrica [Pregabalin] Other (See Comments)    EXTREME SHAKING/TREMBLING   Darvon [Propoxyphene]     UNSPECIFIED REACTION    Gabapentin     Extreme shaking and trembling    Augmentin [Amoxicillin-Pot Clavulanate] Nausea And Vomiting    Has patient had a PCN reaction causing immediate rash, facial/tongue/throat swelling, SOB or lightheadedness with hypotension:no Has patient had a PCN reaction causing severe rash involving mucus membranes or skin necrosis:No Has patient had a PCN reaction that required hospitalization:No Has patient had a PCN reaction occurring within the last 10 years:Yes--ONLY N/V If all of the above answers are "NO", then may proceed with Cephalosporin use.    Erythromycin Itching and  Rash        Vibramycin [Doxycycline Calcium] Itching and Rash    "[place] "    Family History  Problem Relation Age of Onset   Heart disease Mother    Diabetes Mother    Hypertension Mother    Hyperlipidemia Mother    COPD Mother        smoked   Cancer Mother        bile duct   COPD Father    Diabetes Father    Heart disease Father    Cancer Father        throat   Dementia Father    Asthma Son    Hypertension Sister    Hyperlipidemia Sister    Hypertension Brother  Hyperlipidemia Brother    Hypertension Brother    Hyperlipidemia Brother    Emphysema Maternal Grandmother        smoked   Colon cancer Neg Hx    Colon polyps Neg Hx     Prior to Admission medications   Medication Sig Start Date End Date Taking? Authorizing Provider  albuterol (VENTOLIN HFA) 108 (90 Base) MCG/ACT inhaler Inhale 2 puffs into the lungs every 6 (six) hours as needed for wheezing or shortness of breath. 09/15/21  Yes Gottschalk, Leatrice Jewels M, DO  amLODipine (NORVASC) 10 MG tablet Take 1 tablet (10 mg total) by mouth daily. 09/26/21  Yes Gottschalk, Leatrice Jewels M, DO  atorvastatin (LIPITOR) 40 MG tablet Take 1 tablet (40 mg total) by mouth daily. 07/09/20  Yes Gottschalk, Leatrice Jewels M, DO  bisoprolol (ZEBETA) 5 MG tablet Take 0.5 tablets (2.5 mg total) by mouth daily. Take in Place of Metoprolol 08/07/21  Yes Emokpae, Courage, MD  dextromethorphan (DELSYM) 30 MG/5ML liquid Take 15 mg by mouth as needed for cough.   Yes [provider]  diclofenac Sodium (VOLTAREN) 1 % GEL Apply 2 g topically 4 (four) times daily. 09/05/21  Yes [provider]  fluticasone-salmeterol (ADVAIR DISKUS) 250-50 MCG/ACT AEPB Inhale 1 puff into the lungs in the morning and at bedtime. Patient taking differently: Inhale 1 puff into the lungs daily. 08/07/21  Yes Roxan Hockey, MD  lipase/protease/amylase 24000-76000 units CPEP Take 2 caps daily with meals and 1 cap with snacks Patient taking differently: Take 1-2 capsules  by mouth See admin instructions. Take 2 capsules by mouth daily with meals; and take 1 capsule by mouth with snacks. 07/03/21  Yes Johnson, Clanford L, MD  mirtazapine (REMERON) 15 MG tablet Take 1 tablet (15 mg total) by mouth at bedtime. 09/26/21  Yes Gottschalk, Leatrice Jewels M, DO  Nutritional Supplements (ENSURE HIGH PROTEIN) LIQD Drink 1 can 3 times daily. 08/30/20  Yes Gottschalk, Leatrice Jewels M, DO  omeprazole (PRILOSEC) 40 MG capsule Take 1 capsule (40 mg total) by mouth in the morning and at bedtime. Take one capsule once to twice daily before a meal. 09/26/21  Yes Gottschalk, Ashly M, DO  oxybutynin (DITROPAN-XL) 5 MG 24 hr tablet TAKE 1 TABLET AT BEDTIME Patient taking differently: Take 5 mg by mouth at bedtime. 09/05/21  Yes Gottschalk, Leatrice Jewels M, DO  oxyCODONE-acetaminophen (PERCOCET) 10-325 MG tablet Take 1 tablet by mouth 5 (five) times daily. 08/27/20  Yes [provider]  OXYCONTIN 15 MG 12 hr tablet Take 15 mg by mouth every 12 (twelve) hours. 08/28/20  Yes [provider]  OXYGEN Inhale 4 L into the lungs See admin instructions. continuous   Yes [provider]  prochlorperazine (COMPAZINE) 25 MG suppository Place 1 suppository (25 mg total) rectally every 12 (twelve) hours as needed for nausea or vomiting. 08/07/21  Yes Emokpae, Courage, MD  Tiotropium Bromide Monohydrate (SPIRIVA RESPIMAT) 2.5 MCG/ACT AERS Inhale 2 puffs into the lungs daily. 08/07/21  Yes Emokpae, Courage, MD  tiZANidine (ZANAFLEX) 4 MG capsule Take 1 capsule (4 mg total) by mouth 3 (three) times daily as needed for muscle spasms. 09/13/21  Yes Barton Dubois, MD  traZODone (DESYREL) 150 MG tablet TAKE 1 TO 2 TABLETS AT BEDTIME Patient taking differently: Take 225 mg by mouth at bedtime. 03/30/21  Yes Janora Norlander, DO    Physical Exam: Vitals:   09/26/21 2230 09/26/21 2300 09/27/21 0000 09/27/21 0132  BP: (!) 152/76 110/89 138/80   Pulse: (!) 106 98 95  Resp: (!) 21 14 (!) 22   Temp:  98.2 F (36.8  C) 98.5 F (36.9 C)   TempSrc:      SpO2: 96% 100% 99% 100%  Weight:      Height:       1.  General: Patient lying supine in bed,  no acute distress   2. Psychiatric: Alert and oriented x 3, mood and behavior normal for situation, pleasant and cooperative with exam   3. Neurologic: Speech and language are normal, face is symmetric, moves all 4 extremities voluntarily, at baseline without acute deficits on limited exam   4. HEENMT:  Head is atraumatic, normocephalic, pupils reactive to light, neck is supple, trachea is midline, mucous membranes are moist   5. Respiratory : Wheezing bilaterally without rhonchi, rales, no cyanosis, no increase in work of breathing or accessory muscle use   6. Cardiovascular : Heart rate normal, rhythm is regular, no murmurs, rubs or gallops, no peripheral edema, peripheral pulses palpated   7. Gastrointestinal:  Abdomen is soft, minimally distended, nontender to palpation bowel sounds active, no masses or organomegaly palpated   8. Skin:  Skin is warm, dry and intact without rashes, acute lesions, or ulcers on limited exam   9.Musculoskeletal:  No acute deformities or trauma, no asymmetry in tone, no peripheral edema, peripheral pulses palpated, no tenderness to palpation in the extremities  Data Reviewed: In the ED Temp 98.1, heart rate 97-104, respiratory rate 12-30, blood pressure 121/70-150/82, satting at 100% No leukocytosis, hemoglobin stable Chemistry is unremarkable COVID-negative Chest x-ray shows interval resolution of the opacity from previous CTA shows improving bibasilar opacity EKG has a heart rate of 98, sinus rhythm, QTc 447 Admission requested for COPD exacerbation  Assessment and Plan: * COPD exacerbation (HCC) - Worsening dyspnea over 1 week -Palliative care consult to discuss goals of care and manage patient's expectations as she is going to feel short of breath at home -Chest x-ray shows interval significant  resolution of the opacity in the right lower lobe -Nodule that is concerning for bronchogenic neoplasm -Consult oncology -Continue steroids -Continue Zithromax for mild COPD exacerbation -No fever, no leukocytosis -COVID and flu negative -Continue as needed and scheduled nebs -Continue Dulera -Continue to monitor  Hyperlipidemia Continue statin  Chronic pancreatitis ([place]) - Continue pancreatic enzymes with meals  Chronic pain syndrome - Continue every 4 hours as needed oxycodone -Continue scheduled MS Contin -Morphine for severe breakthrough pain -Continue to monitor  GERD (gastroesophageal reflux disease) - Continue PPI  Chronic respiratory failure with hypoxia (HCC) - Currently maintaining oxygen sats on baseline 5 L nasal cannula  Cigarette smoker - Currently smoking 5 cigarettes/day -Counseled on the importance of cessation -Counseled on the dangers of smoking while on oxygen -Continue to monitor  HTN (hypertension) - Continue Norvasc and Zebeta      Advance Care Planning:   Code Status: Full Code   Consults: None  Family Communication: No family at bedside  Severity of Illness: The appropriate patient status for this patient is OBSERVATION. Observation status is judged to be reasonable and necessary in order to provide the required intensity of service to ensure the patient's safety. The patient's presenting symptoms, physical exam findings, and initial radiographic and laboratory data in the context of their medical condition is felt to place them at decreased risk for further clinical deterioration. Furthermore, it is anticipated that the patient will be medically stable for discharge from the hospital within 2 midnights of admission.   Author: [place]  B Zierle-Ghosh, DO 09/27/2021 3:31 AM  For on call review [email or web address].

## 2021-09-27 NOTE — Assessment & Plan Note (Signed)
-   Currently smoking 5 cigarettes/day -Counseled on the importance of cessation -Counseled on the dangers of smoking while on oxygen -Continue to monitor

## 2021-09-27 NOTE — Assessment & Plan Note (Signed)
Continue statin. 

## 2021-09-27 NOTE — Assessment & Plan Note (Signed)
-   Continue Norvasc and Zebeta

## 2021-09-27 NOTE — Consult Note (Signed)
Consultation Note Date: 09/27/2021   Patient Name: Jasmine [name]  DOB: 1955/05/01  MRN: [id number]  Age / Sex: 66 y.o., female  PCP: Janora Norlander, DO Referring Physician: Barton Dubois, MD  Reason for Consultation: Establishing goals of care  HPI/Patient Profile: 66 y.o. female  with past medical history of COPD, right lung cancer (s/p RUL lobectomy 07/29/20), chronic 5L oxygen, allergies, tobacco abuse, anemia, HTN, neuropathy, GERD, chronic pancreatitis, chronic back pain, anxiety admitted on 09/26/2021 with worsening shortness of breath with COPD exacerbation sent from PCP office via EMS. Recent hospitalization 7/7-7/11 for right lower lobe lung pneumonia. Concern on CT chest for RLL nodule progression with suspicious mediastinal lymphadenopathy. CT also shows multifocal pneumonia R > L.   Clinical Assessment and Goals of Care: I met today with Estill Bamberg after reviewing Epic records from current and past hospitalization, PCP, oncology, pulmonology and labs/diagnostics. Reviewed CT chest results. No family or visitors at bedside.   Denice is sitting up in bed. No overt distress at rest. She reports that her breathing is feeling better than when she came to the hospital. She expresses hope that she can return home tomorrow. She knows that she is still struggling to overcome pneumonia and she knows about the concern for enlarging nodule in RLL. She tells me that she would be open to doing chemo and radiation but would not have another surgery. She shares that she has never fully recovered after her lobectomy and in hindsight she wishes that she had not pursued surgical intervention. She has continued to struggle since her surgery. She is on her home oxygen 5L and she has been unable to tolerate anything less than 5L. She is planning follow up with Dr. Delton Coombes and Dr. Melvyn Novas in the near future. We discussed her  worsening lung function with ongoing pneumonia and now concern for recurrent lung cancer. I explained that even pursuing biopsy and testing is more risky for her with her lungs in such poor health. She will need close follow up to further discuss her options and recommendations moving forward.   I discussed with Neomia the importance of Living Will. She has one completed. She did this after her father died when it made her really think about her own planning. She lives with her husband and he is her main caregiver as he cooks, cleans, and helps her with anything she needs. She had one son but he is deceased. I discussed with Shalona the importance of ongoing discussion given her progressing and changing health. She tells me that she has not made a decision if she would want to pursue life support and breathing machines if needed. She shares that she is concerned that her husband would be unable to liberate her from life support if indicated and she would not want to live long term on machines. I encouraged that this is an important, albeit difficult, conversation to have. I encouraged that even if she decided she would want trial of breathing machine that it would be good to put  some parameters around how long to continue if she is not improving to take the burden of these decisions off her husband. She agrees. I recommended outpatient palliative care to follow up and assist with these conversations with herself and her husband and to help clarify her wishes and she agrees. At this current date she needs more time to consider her wishes.   All questions/concerns addressed. Emotional support provided.   Primary Decision Maker PATIENT    SUMMARY OF RECOMMENDATIONS   - Outpatient palliative to continue conversation - Follow up with oncology/pulmonology  Code Status/Advance Care Planning: Full code   Symptom Management:  Per attending.   Prognosis:  Overall prognosis poor with worsening lung  disease, pneumonia, and concern for recurrent cancer.   Discharge Planning: Home with Palliative Services      Primary Diagnoses: Present on Admission:  COPD exacerbation (HCC)  GERD (gastroesophageal reflux disease)  Chronic pain syndrome  Chronic respiratory failure with hypoxia (HCC)  Hyperlipidemia  HTN (hypertension)  Cigarette smoker  Chronic pancreatitis ([place])   I have reviewed the medical record, interviewed the patient and family, and examined the patient. The following aspects are pertinent.  Past Medical History:  Diagnosis Date   Allergy    Anemia    Anxiety    Arthritis    Asthma    Carpal tunnel syndrome    Chronic back pain    Cigarette smoker 7/86/7672   Complication of anesthesia    in the past has had N/V, the last few surgeries she has not been   COPD (chronic obstructive pulmonary disease) ([place])    Easy bruising 07/10/2016   GERD (gastroesophageal reflux disease)    Headache    Heart murmur 2005   never had any problems   History of blood transfusion 2018   History of kidney stones    Hypertension    Hypoglycemia    Hypoglycemia    Iron deficiency anemia 07/11/2016   Neuropathy    both arms   Normocytic anemia 07/29/2015   Pancreatitis    Pneumonia    PONV (postoperative nausea and vomiting)    Postoperative anemia due to acute blood loss 11/15/2015   R lung cancer    Sciatica    Seizures ([place])    as a child when she would have an asthma attack. none as an adult   Shortness of breath dyspnea    Social History   Socioeconomic History   Marital status: Married    Spouse name: [name]   Number of children: 1   Years of education: Not on file   Highest education level: Some college, no degree  Occupational History   Occupation: disability  Tobacco Use   Smoking status: Every Day    Packs/day: 0.25    Years: 35.00    Total pack years: 8.75    Types: Cigarettes   Smokeless tobacco: Never   Tobacco comments:    trying to quit  Vaping Use    Vaping Use: Never used  Substance and Sexual Activity   Alcohol use: Yes    Alcohol/week: 2.0 standard drinks of alcohol    Types: 2 Glasses of wine per week    Comment: occasionally   Drug use: No   Sexual activity: Yes    Birth control/protection: None  Other Topics Concern   Not on file  Social History Narrative   Retired, 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 1 son, deceased. 2 pet dogs. Enjoys watching  TV.   Husband takes care of cooking, cleaning, driving, and helps her with ADLs including bathing   Social Determinants of Health   Financial Resource Strain: Low Risk  (04/01/2021)   Overall Financial Resource Strain (CARDIA)    Difficulty of Paying Living Expenses: Not very hard  Food Insecurity: No Food Insecurity (04/01/2021)   Hunger Vital Sign    Worried About Running Out of Food in the Last Year: Never true    Ran Out of Food in the Last Year: Never true  Transportation Needs: No Transportation Needs (04/01/2021)   PRAPARE - Hydrologist (Medical): No    Lack of Transportation (Non-Medical): No  Physical Activity: Insufficiently Active (04/01/2021)   Exercise Vital Sign    Days of Exercise per Week: 7 days    Minutes of Exercise per Session: 10 min  Stress: No Stress Concern Present (04/01/2021)   [place]    Feeling of Stress : Not at all  Social Connections: Socially Integrated (04/01/2021)   Social Connection and Isolation Panel [NHANES]    Frequency of Communication with Friends and Family: More than three times a week    Frequency of Social Gatherings with Friends and Family: More than three times a week    Attends Religious Services: 1 to 4 times per year    Active Member of [employer] or Organizations: Yes    Attends Archivist Meetings: 1 to 4 times per year    Marital Status: Married   Family History  Problem Relation Age of Onset   Heart  disease Mother    Diabetes Mother    Hypertension Mother    Hyperlipidemia Mother    COPD Mother        smoked   Cancer Mother        bile duct   COPD Father    Diabetes Father    Heart disease Father    Cancer Father        throat   Dementia Father    Asthma Son    Hypertension Sister    Hyperlipidemia Sister    Hypertension Brother    Hyperlipidemia Brother    Hypertension Brother    Hyperlipidemia Brother    Emphysema Maternal Grandmother        smoked   Colon cancer Neg Hx    Colon polyps Neg Hx    Scheduled Meds:  amLODipine  10 mg Oral Daily   atorvastatin  40 mg Oral QPM   azithromycin  500 mg Oral QHS   bisoprolol  2.5 mg Oral Daily   guaiFENesin  1,200 mg Oral BID   heparin  5,000 Units Subcutaneous Q8H   ipratropium-albuterol  3 mL Nebulization Q6H   lipase/protease/amylase  24,000 Units Oral TID WC   mirtazapine  15 mg Oral QHS   mometasone-formoterol  2 puff Inhalation BID   oxybutynin  5 mg Oral QHS   oxyCODONE  15 mg Oral Q12H   pantoprazole  40 mg Oral Daily   [START ON 09/28/2021] predniSONE  40 mg Oral Q breakfast   Ensure Max Protein   Oral TID AC & HS   traZODone  225 mg Oral QHS   umeclidinium bromide  1 puff Inhalation Daily   Continuous Infusions: PRN Meds:.acetaminophen **OR** acetaminophen, albuterol, morphine injection, ondansetron **OR** ondansetron (ZOFRAN) IV, oxyCODONE Allergies  Allergen Reactions   Lyrica [Pregabalin] Other (See Comments)    EXTREME SHAKING/TREMBLING  Darvon [Propoxyphene]     UNSPECIFIED REACTION    Gabapentin     Extreme shaking and trembling    Augmentin [Amoxicillin-Pot Clavulanate] Nausea And Vomiting    Has patient had a PCN reaction causing immediate rash, facial/tongue/throat swelling, SOB or lightheadedness with hypotension:no Has patient had a PCN reaction causing severe rash involving mucus membranes or skin necrosis:No Has patient had a PCN reaction that required hospitalization:No Has patient had a  PCN reaction occurring within the last 10 years:Yes--ONLY N/V If all of the above answers are "NO", then may proceed with Cephalosporin use.    Erythromycin Itching and Rash        Vibramycin [Doxycycline Calcium] Itching and Rash    "[place] "   Review of Systems  Constitutional:  Positive for activity change. Negative for appetite change.  Respiratory:  Positive for shortness of breath.   Gastrointestinal:  Positive for nausea and vomiting.       N/V chronic from chronic pancreatitis  Musculoskeletal:  Positive for back pain.    Physical Exam Vitals and nursing note reviewed.  Constitutional:      Appearance: She is ill-appearing.  Cardiovascular:     Rate and Rhythm: Normal rate.  Pulmonary:     Effort: No tachypnea, accessory muscle usage or respiratory distress.     Comments: Shortness of breath at rest with conversation. 5L.  Abdominal:     General: Abdomen is flat.  Neurological:     Mental Status: She is alert and oriented to person, place, and time.     Vital Signs: BP 138/80   Pulse 95   Temp 98.5 F (36.9 C)   Resp (!) 22   Ht '5\' 3"'  (1.6 m)   Wt 46.3 kg   SpO2 100%   BMI 18.07 kg/m  Pain Scale: 0-10   Pain Score: 0-No pain   SpO2: SpO2: 100 % O2 Device:SpO2: 100 % O2 Flow Rate: .O2 Flow Rate (L/min): 5 L/min  IO: Intake/output summary:  Intake/Output Summary (Last 24 hours) at 09/27/2021 1201 Last data filed at 09/27/2021 0800 Gross per 24 hour  Intake 0 ml  Output 1000 ml  Net -1000 ml    LBM: Last BM Date : 09/27/21 Baseline Weight: Weight: 46.3 kg Most recent weight: Weight: 46.3 kg     Palliative Assessment/Data:     Time In: 1440  Time Total: 75 min  Greater than 50%  of this time was spent counseling and coordinating care related to the above assessment and plan.  Signed by: Vinie Sill, NP Palliative Medicine Team Pager # [phone] (M-F 8a-5p) Team Phone # [phone] (Nights/Weekends)

## 2021-09-27 NOTE — Assessment & Plan Note (Signed)
-   Currently maintaining oxygen sats on baseline 5 L nasal cannula

## 2021-09-27 NOTE — Assessment & Plan Note (Signed)
-   Worsening dyspnea over 1 week -Palliative care consult to discuss goals of care and manage patient's expectations as she is going to feel short of breath at home -Chest x-ray shows interval significant resolution of the opacity in the right lower lobe -Nodule that is concerning for bronchogenic neoplasm -Consult oncology -Continue steroids -Continue Zithromax for mild COPD exacerbation -No fever, no leukocytosis -COVID and flu negative -Continue as needed and scheduled nebs -Continue Dulera -Continue to monitor

## 2021-09-27 NOTE — Progress Notes (Signed)
Patient seen and examined; admitted after midnight secondary to worsening shortness of breath and productive coughing spells.  Patient with recent hospitalization due to community-acquired pneumonia, for which she finished antibiotic therapy and felt great.  For the last week or so prior to admission noticed increased shortness of breath, productive coughing spells (clear to yellow sputum in appearance) and noticing increased wheezing that was not relieved by her bronchodilator management.  Patient was seen at her PCP office where she received a dose of his steroids and nebulizer management; chest x-ray done demonstrating a significant resolution and improvement in previous infiltrates; but due to shortness of breath was asked to come to the emergency department for further relation and management.  No fever, no WBCs, hemodynamically stable; good oxygen saturation on 5 L Account supplementation (chronic for her).  Feeling better on improve after further steroids and bronchodilator management provided.  Most likely ready to go home tomorrow.  Please refer to H&P written by Dr. Clearence Ped for further info/details on admission.  Plan: -No need for antibiotics currently -Started steroids tapering; we will continue bronchodilator management -Start flutter valve -Follow clinical response. -Palliative care consulted and will follow recommendations as per discussion for goals of care and advance directives. -resume home meds  Barton Dubois MD [phone]

## 2021-09-27 NOTE — Assessment & Plan Note (Signed)
-   Continue pancreatic enzymes with meals

## 2021-09-27 NOTE — Assessment & Plan Note (Signed)
-   Continue every 4 hours as needed oxycodone -Continue scheduled MS Contin -Morphine for severe breakthrough pain -Continue to monitor

## 2021-09-27 NOTE — Assessment & Plan Note (Signed)
Continue PPI ?

## 2021-09-27 NOTE — Progress Notes (Addendum)
  Transition of Care [facility]) Screening Note   Patient Details  Name: ORIAH LEINWEBER Date of Birth: 1956-02-28   Transition of Care [facility]) CM/SW Contact:    Iona Beard, [place] Phone Number: 09/27/2021, 12:38 PM  Csw updated by Palliative NP that pt is agreeable to outpatient palliative referral. CSW to make outpatient referral to [facility] [place] at this time.  Transition of Care Department [facility]) has reviewed patient and no TOC needs have been identified at this time. We will continue to monitor patient advancement through interdisciplinary progression rounds. If new patient transition needs arise, please place a TOC consult.

## 2021-09-28 DIAGNOSIS — Z515 Encounter for palliative care: Secondary | ICD-10-CM | POA: Diagnosis not present

## 2021-09-28 DIAGNOSIS — Z7189 Other specified counseling: Secondary | ICD-10-CM | POA: Diagnosis not present

## 2021-09-28 DIAGNOSIS — J441 Chronic obstructive pulmonary disease with (acute) exacerbation: Secondary | ICD-10-CM | POA: Diagnosis not present

## 2021-09-28 LAB — CBC
HCT: 32.1 % — ABNORMAL LOW (ref 36.0–46.0)
Hemoglobin: 9.7 g/dL — ABNORMAL LOW (ref 12.0–15.0)
MCH: 32.6 pg (ref 26.0–34.0)
MCHC: 30.2 g/dL (ref 30.0–36.0)
MCV: 107.7 fL — ABNORMAL HIGH (ref 80.0–100.0)
Platelets: 254 10*3/uL (ref 150–400)
RBC: 2.98 MIL/uL — ABNORMAL LOW (ref 3.87–5.11)
RDW: 13.7 % (ref 11.5–15.5)
WBC: 8.1 10*3/uL (ref 4.0–10.5)
nRBC: 0 % (ref 0.0–0.2)

## 2021-09-28 LAB — BASIC METABOLIC PANEL
Anion gap: 7 (ref 5–15)
BUN: 25 mg/dL — ABNORMAL HIGH (ref 8–23)
CO2: 29 mmol/L (ref 22–32)
Calcium: 9.5 mg/dL (ref 8.9–10.3)
Chloride: 102 mmol/L (ref 98–111)
Creatinine, Ser: 0.46 mg/dL (ref 0.44–1.00)
GFR, Estimated: >60 mL/min (ref >60)
Glucose, Bld: 141 mg/dL — ABNORMAL HIGH (ref 70–99)
Potassium: 3.9 mmol/L (ref 3.5–5.1)
Sodium: 138 mmol/L (ref 135–145)

## 2021-09-28 MED ORDER — OMEPRAZOLE 40 MG PO CPDR: 40.0000 mg | DELAYED_RELEASE_CAPSULE | Freq: Two times a day (BID) | ORAL | 3 refills | Status: DC

## 2021-09-28 NOTE — Progress Notes (Signed)
Fax from [facility] RE: Omeprazole 40 mg Conflicting directions: one cap in morning & at bedtime. Take one cap once to twice daily before a meal Written directions by Dr. Lajuana Ripple faxed back to [facility] 1 PO BID w/ meals Corrected Rx in chart and set to NO PRINT so that next RF was correct.

## 2021-09-28 NOTE — TOC Transition Note (Signed)
Transition of Care [facility]) - CM/SW Discharge Note   Patient Details  Name: Jasmine [name] MRN: [id number] Date of Birth: 1955/03/10  Transition of Care [facility]) CM/SW Contact:  Iona Beard, [place] Phone Number: 09/28/2021, 11:37 AM   Clinical Narrative:    Pt is high risk for readmission. CSW spoke with pt in room to complete assessment. Pt states that she lives with her husband and he assist with ADLs. Pts husband provides transportation as needed. Pt states she is not interested in [facility] services at this time. Pt states that she has a cane, walker and wheelchair to use when needed. TOC signing off.     Barriers to Discharge: No Barriers Identified   Patient Goals and CMS Choice Patient states their goals for this hospitalization and ongoing recovery are:: return home CMS Medicare.gov Compare Post Acute Care list provided to:: Patient Choice offered to / list presented to : Patient  Discharge Placement                       Discharge Plan and Services In-house Referral: Clinical Social Work Discharge Planning Services: CM Consult                                 Social Determinants of Health (SDOH) Interventions     Readmission Risk Interventions    09/28/2021   11:35 AM 08/05/2021   11:27 AM 07/01/2021    1:45 PM  Readmission Risk Prevention Plan  Transportation Screening Complete Complete Complete  Medication Review [employer]) Complete Complete   PCP or Specialist appointment within 3-5 days of discharge   Not Complete  HRI or Home Care Consult Patient refused Patient refused Complete  SW Recovery Care/Counseling Consult Complete Complete Complete  Palliative Care Screening Not Applicable Not Applicable Not [place] Not Applicable Not Applicable Not Applicable

## 2021-09-28 NOTE — Progress Notes (Signed)
Palliative:  HPI: 66 y.o. female  with past medical history of COPD, right lung cancer (s/p RUL lobectomy 07/29/20), chronic 5L oxygen, allergies, tobacco abuse, anemia, HTN, neuropathy, GERD, chronic pancreatitis, chronic back pain, anxiety admitted on 09/26/2021 with worsening shortness of breath with COPD exacerbation sent from PCP office via EMS. Recent hospitalization 7/7-7/11 for right lower lobe lung pneumonia. Concern on CT chest for RLL nodule progression with suspicious mediastinal lymphadenopathy. CT also shows multifocal pneumonia R > L.    I met today again at [facility] bedside. She reports that her pain and breathing continue to improve. She continues with significant fatigue and poor reserve. She tells me that she is staying one more night and hopeful for discharge home tomorrow. She continues to be on board to discuss goals of care at home with her husband and assistance from outpatient palliative care. She is not prepared and does not feel up to discussing this further today. I took her a Hard Choices booklet to help her think about her wishes and what is going to be best for her. She does understand the importance of these decisions. She plans to follow up with pulmonology and oncology as well.   Exam: Alert, oriented. Poor reserve. No distress at rest on 5L. Abd soft, flat. Moves all extremities. Fatigued.   Plan: - Hard Choices given - Continue goals of care conversations at home with assistance of outpatient palliative care  25 min  Vinie Sill, NP Palliative Medicine Team Pager [phone] (Please see [email or web address] for schedule) Team Phone [phone]    Greater than 50%  of this time was spent counseling and coordinating care related to the above assessment and plan

## 2021-09-28 NOTE — Progress Notes (Signed)
PROGRESS NOTE    Jasmine [name]  OVF:[id number] DOB: March 28, 1955 DOA: 09/26/2021 PCP: Janora Norlander, DO   Brief Narrative:    Jasmine [name] is a 66 y.o. female with medical history significant of allergies, anemia, COPD, smoker, GERD, hypertension, lung cancer, and more presents ED with a chief complaint of dyspnea.  Patient has been admitted for acute COPD exacerbation and remains on her usual 5 L nasal cannula oxygen.  Assessment & Plan:   Principal Problem:   COPD exacerbation ([place]) Active Problems:   HTN (hypertension)   COPD with acute exacerbation (HCC)   Cigarette smoker   Chronic respiratory failure with hypoxia (HCC)   GERD (gastroesophageal reflux disease)   Chronic pain syndrome   Chronic pancreatitis (HCC)   Hyperlipidemia  Assessment and Plan:  COPD exacerbation (HCC)-improving - Worsening dyspnea over 1 week -Has chronic hypoxemia with 5 L nasal cannula oxygen -Palliative care consult to discuss goals of care and manage patient's expectations as she is going to feel short of breath at home -Chest x-ray shows interval significant resolution of the opacity in the right lower lobe -Nodule that is concerning for bronchogenic neoplasm -Consult oncology -Continue steroids, currently on prednisone -Continue Zithromax for mild COPD exacerbation -No fever, no leukocytosis -COVID and flu negative -Continue as needed and scheduled nebs -Continue Dulera -Continue to monitor   Hyperlipidemia Continue statin   Chronic pancreatitis ([place]) - Continue pancreatic enzymes with meals   Chronic pain syndrome - Continue every 4 hours as needed oxycodone -Continue scheduled MS Contin -Morphine for severe breakthrough pain -Continue to monitor   GERD (gastroesophageal reflux disease) - Continue PPI   Chronic respiratory failure with hypoxia (HCC) - Currently maintaining oxygen sats on baseline 5 L nasal cannula   Cigarette smoker - Currently smoking 5  cigarettes/day -Counseled on the importance of cessation -Counseled on the dangers of smoking while on oxygen -Continue to monitor   HTN (hypertension) - Continue Norvasc and Zebeta     DVT prophylaxis: Heparin Code Status: Full Family Communication: None at bedside Disposition Plan:  Status is: Inpatient Remains inpatient appropriate because: Need for IV medications.   Nutritional Assessment:  The patient's BMI is: Body mass index is 18.07 kg/m.Marland Kitchen  Seen by dietician.  I agree with the assessment and plan as outlined below:  Nutrition Status:      Consultants:  None  Procedures:  None  Antimicrobials:  Anti-infectives (From admission, onward)    Start     Dose/Rate Route Frequency Ordered Stop   09/27/21 2215  azithromycin (ZITHROMAX) tablet 500 mg  Status:  Discontinued       See Hyperspace for full Linked Orders Report.   500 mg Oral Daily at bedtime 09/26/21 2348 09/27/21 1508   09/27/21 0015  azithromycin (ZITHROMAX) 500 mg in sodium chloride 0.9 % 250 mL IVPB       See Hyperspace for full Linked Orders Report.   500 mg 250 mL/hr over 60 Minutes Intravenous Every 24 hours 09/26/21 2348 09/27/21 0157      Subjective: Patient seen and evaluated today with ongoing shortness of breath and chest tightness with cough.  She states that she is improving, but not quite at baseline.  Objective: Vitals:   09/28/21 0458 09/28/21 0805 09/28/21 0807 09/28/21 0924  BP: 130/77   130/70  Pulse: 73     Resp: 18     Temp: 98.1 F (36.7 C)     TempSrc: Oral     SpO2: 100%  100% 100%   Weight:      Height:        Intake/Output Summary (Last 24 hours) at 09/28/2021 1220 Last data filed at 09/28/2021 0300 Gross per 24 hour  Intake --  Output 2600 ml  Net -2600 ml   Filed Weights   09/26/21 1802  Weight: 46.3 kg    Examination:  General exam: Appears calm and comfortable  Respiratory system: Minimal wheezing bilaterally. Respiratory effort normal.  5 L nasal  cannula. Cardiovascular system: S1 & S2 heard, RRR.  Gastrointestinal system: Abdomen is soft Central nervous system: Alert and awake Extremities: No edema Skin: No significant lesions noted Psychiatry: Flat affect.    Data Reviewed: I have personally reviewed following labs and imaging studies  CBC: Recent Labs  Lab 09/26/21 1900 09/27/21 0546 09/28/21 0555  WBC 6.0 7.4 8.1  NEUTROABS 3.5 6.1  --   HGB 10.0* 10.5* 9.7*  HCT 32.9* 34.0* 32.1*  MCV 107.2* 105.9* 107.7*  PLT 274 256 270   Basic Metabolic Panel: Recent Labs  Lab 09/26/21 1900 09/27/21 0546 09/28/21 0555  NA 138 137 138  K 4.7 4.7 3.9  CL 102 101 102  CO2 28 28 29   GLUCOSE 91 169* 141*  BUN 15 21 25*  CREATININE 0.44 0.65 0.46  CALCIUM 9.4 8.9 9.5  MG  --  2.2  --    GFR: Estimated Creatinine Clearance: 49.2 mL/min (by C-G formula based on SCr of 0.46 mg/dL). Liver Function Tests: Recent Labs  Lab 09/27/21 0546  AST 54*  ALT 26  ALKPHOS 79  BILITOT 0.5  PROT 7.0  ALBUMIN 3.5   No results for input(s): "LIPASE", "AMYLASE" in the last 168 hours. No results for input(s): "AMMONIA" in the last 168 hours. Coagulation Profile: No results for input(s): "INR", "PROTIME" in the last 168 hours. Cardiac Enzymes: No results for input(s): "CKTOTAL", "CKMB", "CKMBINDEX", "TROPONINI" in the last 168 hours. BNP (last 3 results) No results for input(s): "PROBNP" in the last 8760 hours. HbA1C: No results for input(s): "HGBA1C" in the last 72 hours. CBG: No results for input(s): "GLUCAP" in the last 168 hours. Lipid Profile: No results for input(s): "CHOL", "HDL", "LDLCALC", "TRIG", "CHOLHDL", "LDLDIRECT" in the last 72 hours. Thyroid Function Tests: No results for input(s): "TSH", "T4TOTAL", "FREET4", "T3FREE", "THYROIDAB" in the last 72 hours. Anemia Panel: No results for input(s): "VITAMINB12", "FOLATE", "FERRITIN", "TIBC", "IRON", "RETICCTPCT" in the last 72 hours. Sepsis Labs: No results for  input(s): "PROCALCITON", "LATICACIDVEN" in the last 168 hours.  Recent Results (from the past 240 hour(s))  SARS Coronavirus 2 by RT PCR (hospital order, performed in [facility] hospital lab) *cepheid single result test* Anterior Nasal Swab     Status: None   Collection Time: 09/26/21  7:00 PM   Specimen: Anterior Nasal Swab  Result Value Ref Range Status   SARS Coronavirus 2 by RT PCR NEGATIVE NEGATIVE Final    Comment: (NOTE) SARS-CoV-2 target nucleic acids are NOT DETECTED.  The SARS-CoV-2 RNA is generally detectable in upper and lower respiratory specimens during the acute phase of infection. The lowest concentration of SARS-CoV-2 viral copies this assay can detect is 250 copies / mL. A negative result does not preclude SARS-CoV-2 infection and should not be used as the sole basis for treatment or other patient management decisions.  A negative result may occur with improper specimen collection / handling, submission of specimen other than nasopharyngeal swab, presence of viral mutation(s) within the areas targeted by this  assay, and inadequate number of viral copies (<250 copies / mL). A negative result must be combined with clinical observations, patient history, and epidemiological information.  Fact Sheet for Patients:   https://www.patel.info/  Fact Sheet for Healthcare Providers: https://hall.com/  This test is not yet approved or  cleared by the [place] FDA and has been authorized for detection and/or diagnosis of SARS-CoV-2 by FDA under an Emergency Use Authorization (EUA).  This EUA will remain in effect (meaning this test can be used) for the duration of the COVID-19 declaration under Section 564(b)(1) of the Act, 21 U.S.C. section 360bbb-3(b)(1), unless the authorization is terminated or revoked sooner.  Performed at [facility], [address] [place], [place] [address]          Radiology Studies: CT Angio  Chest PE W/Cm &/Or Wo Cm  Result Date: 09/26/2021 CLINICAL DATA:  Right lower lobe pneumonia, acute on chronic respiratory failure EXAM: CT ANGIOGRAPHY CHEST WITH CONTRAST TECHNIQUE: Multidetector CT imaging of the chest was performed using the standard protocol during bolus administration of intravenous contrast. Multiplanar CT image reconstructions and MIPs were obtained to evaluate the vascular anatomy. RADIATION DOSE REDUCTION: This exam was performed according to the departmental dose-optimization program which includes automated exposure control, adjustment of the mA and/or kV according to patient size and/or use of iterative reconstruction technique. CONTRAST:  61mL OMNIPAQUE IOHEXOL 350 MG/ML SOLN COMPARISON:  CTA chest dated 09/09/2021 FINDINGS: Cardiovascular: Satisfactory opacification the bilateral pulmonary arteries to the segmental level. No evidence of pulmonary embolism. Although not tailored for evaluation of the thoracic aorta, there is no evidence of thoracic aortic aneurysm or dissection. Mild cardiomegaly.  No pericardial effusion. Three-vessel coronary sclerosis. Mediastinum/Nodes: Suspicious mediastinal lymphadenopathy. Visualized thyroid is unremarkable. Lungs/Pleura: Status post right upper lobectomy. Scarring/atelectasis volume loss in the right middle lobe. Mild patchy bibasilar opacities, right greater than left, mildly improved from the prior and favoring multifocal pneumonia (series 6/image 115). However, there is continued progression of the irregular nodular opacity in the anterior right upper lobe (series 6/image 33) which appears to involve and possibly retract the right major fissure (series 6/image 37). This measures 1.7 x 2.2 cm on representative axial imaging (series 6/image 41), previously 1.4 x 2.0 cm. While rapid onset since April 2023 is more suggestive of an infectious inflammatory process, the appearance/configuration and the continued progression is concerning for  neoplasm. Moderate centrilobular and paraseptal emphysematous changes, upper lung predominant. No pleural effusion or pneumothorax. Upper Abdomen: Visualized upper abdomen is grossly unremarkable, noting vascular calcifications. Musculoskeletal: Degenerative changes of the visualized thoracolumbar spine. Thoracic spine stimulator. Cervical spine fixation hardware, incompletely visualized. Review of the MIP images confirms the above findings. IMPRESSION: No evidence of pulmonary embolism. Improving bibasilar opacities, favoring multifocal pneumonia. Status post right upper lobectomy. Progressive irregular right lower lobe nodule, now measuring 2.2 cm, indeterminate but concerning for primary bronchogenic neoplasm. Discussion at multidisciplinary tumor board is suggested. Aortic Atherosclerosis (ICD10-I70.0) and Emphysema (ICD10-J43.9). Electronically Signed   By: Julian Hy M.D.   On: 09/26/2021 20:34   DG Chest 2 View  Result Date: 09/26/2021 CLINICAL DATA:  worsening SOB, recent recurrent CAP RLL EXAM: CHEST - 2 VIEW COMPARISON:  September 12, 2021 FINDINGS: The heart size and mediastinal contours are within normal limits with stable atheromatous calcifications of the arch of the aorta. Again seen are the post right upper lobectomy changes with some volume loss and mild rightward deviation of the trachea. There is some scarring seen at the right perihilar and  right upper thorax. There has been interval resolution of the patchy opacity in the right upper lung. Emphysema and interstitial changes. No consolidation, pleural effusion or vascular congestion. Stable spinal stimulator. The visualized skeletal structures are unremarkable. IMPRESSION: There has been interval significant resolution of the opacity in the right upper lung. There is some scarring seen at the right pulmonary apex and right perihilar area. Emphysema and interstitial changes. Electronically Signed   By: Frazier Richards M.D.   On: 09/26/2021  16:30        Scheduled Meds:  amLODipine  10 mg Oral Daily   atorvastatin  40 mg Oral QPM   bisoprolol  2.5 mg Oral Daily   guaiFENesin  1,200 mg Oral BID   heparin  5,000 Units Subcutaneous Q8H   ipratropium-albuterol  3 mL Nebulization BID   lipase/protease/amylase  24,000 Units Oral TID WC   mirtazapine  15 mg Oral QHS   mometasone-formoterol  2 puff Inhalation BID   oxybutynin  5 mg Oral QHS   oxyCODONE  15 mg Oral Q12H   pantoprazole  40 mg Oral Daily   predniSONE  40 mg Oral Q breakfast   Ensure Max Protein   Oral TID AC & HS   tiZANidine  6 mg Oral TID   traZODone  225 mg Oral QHS   umeclidinium bromide  1 puff Inhalation Daily     LOS: 1 day    Time spent: 35 minutes    Dulcinea Kinser Darleen Crocker, DO [facility]  If 7PM-7AM, please contact night-coverage [email or web address] 09/28/2021, 12:20 PM

## 2021-09-28 NOTE — Addendum Note (Signed)
Addended by: Antonietta Barcelona D on: 09/28/2021 09:30 AM   Modules accepted: Orders

## 2021-09-29 ENCOUNTER — Telehealth: Payer: Self-pay | Admitting: Gastroenterology

## 2021-09-29 DIAGNOSIS — J441 Chronic obstructive pulmonary disease with (acute) exacerbation: Secondary | ICD-10-CM | POA: Diagnosis not present

## 2021-09-29 DIAGNOSIS — K838 Other specified diseases of biliary tract: Secondary | ICD-10-CM

## 2021-09-29 DIAGNOSIS — K861 Other chronic pancreatitis: Secondary | ICD-10-CM

## 2021-09-29 MED ORDER — PREDNISONE 20 MG PO TABS: 40.0000 mg | ORAL_TABLET | Freq: Every day | ORAL | 0 refills | Status: AC

## 2021-09-29 NOTE — Discharge Summary (Signed)
Physician Discharge Summary  Jasmine [name] NGE:[id number] DOB: November 14, 1955 DOA: 09/26/2021  PCP: Janora Norlander, DO  Admit date: 09/26/2021  Discharge date: 09/29/2021  Admitted From:Home  Disposition:  Home  Recommendations for Outpatient Follow-up:  Follow up with PCP in 1-2 weeks Follow-up with oncology and pulmonology as previously scheduled Continue prednisone as ordered for 5 more days Continue other home medications as previous Outpatient palliative services to be set up  Home Health: None  Equipment/Devices: Has home 5 L nasal cannula oxygen  Discharge Condition:Stable  CODE STATUS: Full  Diet recommendation: Heart Healthy  Brief/Interim Summary: Jasmine [name] is a 66 y.o. female with medical history significant of allergies, anemia, COPD, smoker, GERD, hypertension, lung cancer, and more presents ED with a chief complaint of dyspnea.  Patient has been admitted for acute COPD exacerbation and remained on her usual 5 L nasal cannula oxygen, but required several days of IV Solu-Medrol and breathing treatments as well as brief use of Zithromax to help improve her symptoms.  Chest x-ray did not demonstrate any acute findings.  She now feels as though she is back to baseline and is in stable condition for discharge and will resume breathing treatments as needed at home and will continue on prednisone as prescribed for several more days.  She was also seen by palliative care while admitted and will be set up with outpatient palliative services.  Discharge Diagnoses:  Principal Problem:   COPD exacerbation ([place]) Active Problems:   HTN (hypertension)   COPD with acute exacerbation (HCC)   Cigarette smoker   Chronic respiratory failure with hypoxia (HCC)   GERD (gastroesophageal reflux disease)   Chronic pain syndrome   Chronic pancreatitis ([place])   Hyperlipidemia  Principal discharge diagnosis: Acute COPD exacerbation with chronic hypoxemia.  Discharge  Instructions  Discharge Instructions     Diet - low sodium heart healthy   Complete by: As directed    Increase activity slowly   Complete by: As directed       Allergies as of 09/29/2021       Reactions   Lyrica [pregabalin] Other (See Comments)   EXTREME SHAKING/TREMBLING   Darvon [propoxyphene]    UNSPECIFIED REACTION    Gabapentin    Extreme shaking and trembling    Augmentin [amoxicillin-pot Clavulanate] Nausea And Vomiting   Has patient had a PCN reaction causing immediate rash, facial/tongue/throat swelling, SOB or lightheadedness with hypotension:no Has patient had a PCN reaction causing severe rash involving mucus membranes or skin necrosis:No Has patient had a PCN reaction that required hospitalization:No Has patient had a PCN reaction occurring within the last 10 years:Yes--ONLY N/V If all of the above answers are "NO", then may proceed with Cephalosporin use.   Erythromycin Itching, Rash      Vibramycin [doxycycline Calcium] Itching, Rash   "SPLOTCHING "        Medication List     TAKE these medications    albuterol 108 (90 Base) MCG/ACT inhaler Commonly known as: VENTOLIN HFA Inhale 2 puffs into the lungs every 6 (six) hours as needed for wheezing or shortness of breath.   amLODipine 10 MG tablet Commonly known as: NORVASC Take 1 tablet (10 mg total) by mouth daily.   atorvastatin 40 MG tablet Commonly known as: Lipitor Take 1 tablet (40 mg total) by mouth daily.   bisoprolol 5 MG tablet Commonly known as: ZEBETA Take 0.5 tablets (2.5 mg total) by mouth daily. Take in Place of Metoprolol   dextromethorphan 30  MG/5ML liquid Commonly known as: DELSYM Take 15 mg by mouth as needed for cough.   diclofenac Sodium 1 % Gel Commonly known as: VOLTAREN Apply 2 g topically 4 (four) times daily.   Ensure High Protein Liqd Drink 1 can 3 times daily.   fluticasone-salmeterol 250-50 MCG/ACT Aepb Commonly known as: Advair Diskus Inhale 1 puff into the  lungs in the morning and at bedtime. What changed: when to take this   mirtazapine 15 MG tablet Commonly known as: REMERON Take 1 tablet (15 mg total) by mouth at bedtime.   omeprazole 40 MG capsule Commonly known as: PRILOSEC Take 1 capsule (40 mg total) by mouth 2 (two) times daily with a meal.   oxybutynin 5 MG 24 hr tablet Commonly known as: DITROPAN-XL TAKE 1 TABLET AT BEDTIME   oxyCODONE-acetaminophen 10-325 MG tablet Commonly known as: PERCOCET Take 1 tablet by mouth 5 (five) times daily.   OxyCONTIN 15 mg 12 hr tablet Generic drug: oxyCODONE Take 15 mg by mouth every 12 (twelve) hours.   OXYGEN Inhale 4 L into the lungs See admin instructions. continuous   Pancrelipase (Lip-Prot-Amyl) 24000-76000 units Cpep Take 2 caps daily with meals and 1 cap with snacks What changed:  how much to take how to take this when to take this additional instructions   predniSONE 20 MG tablet Commonly known as: DELTASONE Take 2 tablets (40 mg total) by mouth daily with breakfast for 5 days. Start taking on: September 30, 2021   prochlorperazine 25 MG suppository Commonly known as: COMPAZINE Place 1 suppository (25 mg total) rectally every 12 (twelve) hours as needed for nausea or vomiting.   Spiriva Respimat 2.5 MCG/ACT Aers Generic drug: Tiotropium Bromide Monohydrate Inhale 2 puffs into the lungs daily.   tiZANidine 4 MG capsule Commonly known as: ZANAFLEX Take 1 capsule (4 mg total) by mouth 3 (three) times daily as needed for muscle spasms.   traZODone 150 MG tablet Commonly known as: DESYREL TAKE 1 TO 2 TABLETS AT BEDTIME What changed: See the new instructions.        Follow-up Information     Ronnie Doss M, DO. Schedule an appointment as soon as possible for a visit in 2 week(s).   Specialty: Family Medicine Contact information: [place] [phone]                Allergies  Allergen Reactions   Lyrica [Pregabalin]  Other (See Comments)    EXTREME SHAKING/TREMBLING   Darvon [Propoxyphene]     UNSPECIFIED REACTION    Gabapentin     Extreme shaking and trembling    Augmentin [Amoxicillin-Pot Clavulanate] Nausea And Vomiting    Has patient had a PCN reaction causing immediate rash, facial/tongue/throat swelling, SOB or lightheadedness with hypotension:no Has patient had a PCN reaction causing severe rash involving mucus membranes or skin necrosis:No Has patient had a PCN reaction that required hospitalization:No Has patient had a PCN reaction occurring within the last 10 years:Yes--ONLY N/V If all of the above answers are "NO", then may proceed with Cephalosporin use.    Erythromycin Itching and Rash        Vibramycin [Doxycycline Calcium] Itching and Rash    "[place] "    Consultations: Palliative   Procedures/Studies: CT Angio Chest PE W/Cm &/Or Wo Cm  Result Date: 09/26/2021 CLINICAL DATA:  Right lower lobe pneumonia, acute on chronic respiratory failure EXAM: CT ANGIOGRAPHY CHEST WITH CONTRAST TECHNIQUE: Multidetector CT imaging of the chest was  performed using the standard protocol during bolus administration of intravenous contrast. Multiplanar CT image reconstructions and MIPs were obtained to evaluate the vascular anatomy. RADIATION DOSE REDUCTION: This exam was performed according to the departmental dose-optimization program which includes automated exposure control, adjustment of the mA and/or kV according to patient size and/or use of iterative reconstruction technique. CONTRAST:  51mL OMNIPAQUE IOHEXOL 350 MG/ML SOLN COMPARISON:  CTA chest dated 09/09/2021 FINDINGS: Cardiovascular: Satisfactory opacification the bilateral pulmonary arteries to the segmental level. No evidence of pulmonary embolism. Although not tailored for evaluation of the thoracic aorta, there is no evidence of thoracic aortic aneurysm or dissection. Mild cardiomegaly.  No pericardial effusion. Three-vessel coronary  sclerosis. Mediastinum/Nodes: Suspicious mediastinal lymphadenopathy. Visualized thyroid is unremarkable. Lungs/Pleura: Status post right upper lobectomy. Scarring/atelectasis volume loss in the right middle lobe. Mild patchy bibasilar opacities, right greater than left, mildly improved from the prior and favoring multifocal pneumonia (series 6/image 115). However, there is continued progression of the irregular nodular opacity in the anterior right upper lobe (series 6/image 33) which appears to involve and possibly retract the right major fissure (series 6/image 37). This measures 1.7 x 2.2 cm on representative axial imaging (series 6/image 41), previously 1.4 x 2.0 cm. While rapid onset since April 2023 is more suggestive of an infectious inflammatory process, the appearance/configuration and the continued progression is concerning for neoplasm. Moderate centrilobular and paraseptal emphysematous changes, upper lung predominant. No pleural effusion or pneumothorax. Upper Abdomen: Visualized upper abdomen is grossly unremarkable, noting vascular calcifications. Musculoskeletal: Degenerative changes of the visualized thoracolumbar spine. Thoracic spine stimulator. Cervical spine fixation hardware, incompletely visualized. Review of the MIP images confirms the above findings. IMPRESSION: No evidence of pulmonary embolism. Improving bibasilar opacities, favoring multifocal pneumonia. Status post right upper lobectomy. Progressive irregular right lower lobe nodule, now measuring 2.2 cm, indeterminate but concerning for primary bronchogenic neoplasm. Discussion at multidisciplinary tumor board is suggested. Aortic Atherosclerosis (ICD10-I70.0) and Emphysema (ICD10-J43.9). Electronically Signed   By: Julian Hy M.D.   On: 09/26/2021 20:34   DG Chest 2 View  Result Date: 09/26/2021 CLINICAL DATA:  worsening SOB, recent recurrent CAP RLL EXAM: CHEST - 2 VIEW COMPARISON:  September 12, 2021 FINDINGS: The heart size  and mediastinal contours are within normal limits with stable atheromatous calcifications of the arch of the aorta. Again seen are the post right upper lobectomy changes with some volume loss and mild rightward deviation of the trachea. There is some scarring seen at the right perihilar and right upper thorax. There has been interval resolution of the patchy opacity in the right upper lung. Emphysema and interstitial changes. No consolidation, pleural effusion or vascular congestion. Stable spinal stimulator. The visualized skeletal structures are unremarkable. IMPRESSION: There has been interval significant resolution of the opacity in the right upper lung. There is some scarring seen at the right pulmonary apex and right perihilar area. Emphysema and interstitial changes. Electronically Signed   By: Frazier Richards M.D.   On: 09/26/2021 16:30   DG Chest 2 View  Result Date: 09/12/2021 CLINICAL DATA:  Shortness of breath. History of lung cancer status post right upper lobectomy. EXAM: CHEST - 2 VIEW COMPARISON:  Chest two views 08/06/2021 and 07/12/2021; CT chest 09/09/2021 FINDINGS: Cardiac silhouette and mediastinal contours are within normal limits with calcification again seen within the aortic arch. There is again right perihilar suture with right upper lung volume loss. Patchy opacification within the right upper lobe is similar to 09/09/2021 and 08/06/2021 prior frontal radiographs but  may be slightly increased. This again appears increased compared to 07/12/2021. The left lung is clear. Unchanged chronic blunting of the right costophrenic angle fall likely scarring. No pneumothorax is seen. Moderate multilevel disc space narrowing of the midthoracic spine. Diffuse decreased bone mineralization. Spinal cord stimulator electrodes again overlie the midthoracic spine. IMPRESSION: Status post right upper lobectomy with medial right upper lung heterogeneous airspace opacities that are similar to multiple prior  radiographs but slightly increased. This is compatible with postsurgical change, however it is difficult to exclude superimposed atelectasis versus pneumonia. Electronically Signed   By: Yvonne Kendall M.D.   On: 09/12/2021 08:19   DG Abd 1 View  Result Date: 09/10/2021 CLINICAL DATA:  Lower abdominal pain EXAM: ABDOMEN - 1 VIEW COMPARISON:  08/06/2021 FINDINGS: Postsurgical changes in the lumbar spine are noted. Stimulator pack is seen. Bladder is well distended with contrast opacified urine. Scattered large and small bowel gas is noted. No obstructive changes are seen. No bony abnormality is noted. IMPRESSION: No acute abnormality seen Electronically Signed   By: Inez Catalina M.D.   On: 09/10/2021 03:30   CT Angio Chest PE W and/or Wo Contrast  Result Date: 09/09/2021 CLINICAL DATA:  History of lung cancer status post right upper lobectomy, history of pneumonia, cough, fever for 3 days EXAM: CT ANGIOGRAPHY CHEST WITH CONTRAST TECHNIQUE: Multidetector CT imaging of the chest was performed using the standard protocol during bolus administration of intravenous contrast. Multiplanar CT image reconstructions and MIPs were obtained to evaluate the vascular anatomy. RADIATION DOSE REDUCTION: This exam was performed according to the departmental dose-optimization program which includes automated exposure control, adjustment of the mA and/or kV according to patient size and/or use of iterative reconstruction technique. CONTRAST:  48mL OMNIPAQUE IOHEXOL 350 MG/ML SOLN COMPARISON:  09/09/2021, 08/03/2021 FINDINGS: Cardiovascular: This is a technically adequate evaluation of the pulmonary vasculature. No filling defects or pulmonary emboli. The heart is unremarkable without pericardial effusion. No evidence of thoracic aortic aneurysm or dissection. Stable atherosclerosis. Mediastinum/Nodes: Chronic mild esophageal wall thickening could be related to prior radiation therapy. Thyroid and trachea are stable. No pathologic  adenopathy. Lungs/Pleura: Postsurgical changes from right upper lobectomy. Stable severe emphysema. Increasing nodular consolidation within the right lower lobe, measuring 1.3 x 1.2 cm reference image 37/7, previously measuring approximately 1.2 x 1.0 cm. Findings favor progressive infection over neoplasm, though continued radiographic follow-up will be needed. Dependent areas of consolidation within the bilateral lower lobes, left greater than right, favor pneumonia over atelectasis. Trace right pleural effusion. Chronic right middle lobe atelectasis. No pneumothorax. Upper Abdomen: No acute abnormality. Musculoskeletal: No acute or destructive bony lesions. Stable spinal stimulator. Reconstructed images demonstrate no additional findings. Review of the MIP images confirms the above findings. IMPRESSION: 1. No evidence of pulmonary embolus. 2. Progressive nodular consolidation within the right lower lobe superior segment, with interval development of patchy dependent bilateral lower lobe airspace disease at the bases. Overall, findings favor progressive multifocal pneumonia, though continued radiographic follow-up would be needed to assess resolution and exclude underlying neoplasm. 3. Trace right pleural effusion. 4. Chronic right middle lobe atelectasis unchanged. 5.  Aortic Atherosclerosis (ICD10-I70.0). Electronically Signed   By: Randa Ngo M.D.   On: 09/09/2021 21:51   DG Chest 2 View  Result Date: 09/09/2021 CLINICAL DATA:  Evaluate for pneumonia. Shortness of breath. History of lung cancer. EXAM: CHEST - 2 VIEW COMPARISON:  Chest x-ray 08/06/2021.  CT of the chest 08/03/2021 FINDINGS: Multifocal opacities in the right upper lobe and  surgical changes appear similar to the prior study. There is no new focal lung infiltrate, pleural effusion or pneumothorax identified. Cardiomediastinal silhouette appears stable. Thoracic spinal cord stimulator device is again seen. No acute fractures are identified.  IMPRESSION: 1.  Stable right upper lobe opacities with postoperative changes. 2.  No new focal lung infiltrate. Electronically Signed   By: Ronney Asters M.D.   On: 09/09/2021 20:43     Discharge Exam: Vitals:   09/28/21 2126 09/29/21 0519  BP: (!) 148/92 (!) 146/79  Pulse: 82 72  Resp: 16 17  Temp: 97.9 F (36.6 C) 97.9 F (36.6 C)  SpO2: 100% 100%   Vitals:   09/28/21 1957 09/28/21 1959 09/28/21 2126 09/29/21 0519  BP:   (!) 148/92 (!) 146/79  Pulse:   82 72  Resp:   16 17  Temp:   97.9 F (36.6 C) 97.9 F (36.6 C)  TempSrc:      SpO2: 97% 97% 100% 100%  Weight:      Height:        General: Pt is alert, awake, not in acute distress Cardiovascular: RRR, S1/S2 +, no rubs, no gallops Respiratory: CTA bilaterally, no wheezing, no rhonchi, 5 L nasal cannula oxygen Abdominal: Soft, NT, ND, bowel sounds + Extremities: no edema, no cyanosis    The results of significant diagnostics from this hospitalization (including imaging, microbiology, ancillary and laboratory) are listed below for reference.     Microbiology: Recent Results (from the past 240 hour(s))  SARS Coronavirus 2 by RT PCR (hospital order, performed in [facility] hospital lab) *cepheid single result test* Anterior Nasal Swab     Status: None   Collection Time: 09/26/21  7:00 PM   Specimen: Anterior Nasal Swab  Result Value Ref Range Status   SARS Coronavirus 2 by RT PCR NEGATIVE NEGATIVE Final    Comment: (NOTE) SARS-CoV-2 target nucleic acids are NOT DETECTED.  The SARS-CoV-2 RNA is generally detectable in upper and lower respiratory specimens during the acute phase of infection. The lowest concentration of SARS-CoV-2 viral copies this assay can detect is 250 copies / mL. A negative result does not preclude SARS-CoV-2 infection and should not be used as the sole basis for treatment or other patient management decisions.  A negative result may occur with improper specimen collection / handling,  submission of specimen other than nasopharyngeal swab, presence of viral mutation(s) within the areas targeted by this assay, and inadequate number of viral copies (<250 copies / mL). A negative result must be combined with clinical observations, patient history, and epidemiological information.  Fact Sheet for Patients:   https://www.patel.info/  Fact Sheet for Healthcare Providers: https://hall.com/  This test is not yet approved or  cleared by the [place] FDA and has been authorized for detection and/or diagnosis of SARS-CoV-2 by FDA under an Emergency Use Authorization (EUA).  This EUA will remain in effect (meaning this test can be used) for the duration of the COVID-19 declaration under Section 564(b)(1) of the Act, 21 U.S.C. section 360bbb-3(b)(1), unless the authorization is terminated or revoked sooner.  Performed at [facility], [address], [place], [place] [address]      Labs: BNP (last 3 results) Recent Labs    09/09/21 1941 09/26/21 1900  BNP 79.0 19.1   Basic Metabolic Panel: Recent Labs  Lab 09/26/21 1900 09/27/21 0546 09/28/21 0555  NA 138 137 138  K 4.7 4.7 3.9  CL 102 101 102  CO2 28 28 29  GLUCOSE 91 169* 141*  BUN 15 21 25*  CREATININE 0.44 0.65 0.46  CALCIUM 9.4 8.9 9.5  MG  --  2.2  --    Liver Function Tests: Recent Labs  Lab 09/27/21 0546  AST 54*  ALT 26  ALKPHOS 79  BILITOT 0.5  PROT 7.0  ALBUMIN 3.5   No results for input(s): "LIPASE", "AMYLASE" in the last 168 hours. No results for input(s): "AMMONIA" in the last 168 hours. CBC: Recent Labs  Lab 09/26/21 1900 09/27/21 0546 09/28/21 0555  WBC 6.0 7.4 8.1  NEUTROABS 3.5 6.1  --   HGB 10.0* 10.5* 9.7*  HCT 32.9* 34.0* 32.1*  MCV 107.2* 105.9* 107.7*  PLT 274 256 254   Cardiac Enzymes: No results for input(s): "CKTOTAL", "CKMB", "CKMBINDEX", "TROPONINI" in the last 168 hours. BNP: Invalid input(s):  "POCBNP" CBG: No results for input(s): "GLUCAP" in the last 168 hours. D-Dimer No results for input(s): "DDIMER" in the last 72 hours. Hgb A1c No results for input(s): "HGBA1C" in the last 72 hours. Lipid Profile No results for input(s): "CHOL", "HDL", "LDLCALC", "TRIG", "CHOLHDL", "LDLDIRECT" in the last 72 hours. Thyroid function studies No results for input(s): "TSH", "T4TOTAL", "T3FREE", "THYROIDAB" in the last 72 hours.  Invalid input(s): "FREET3" Anemia work up No results for input(s): "VITAMINB12", "FOLATE", "FERRITIN", "TIBC", "IRON", "RETICCTPCT" in the last 72 hours. Urinalysis    Component Value Date/Time   COLORURINE YELLOW 09/09/2021 1946   APPEARANCEUR CLEAR 09/09/2021 1946   APPEARANCEUR Clear 06/07/2021 1524   LABSPEC 1.040 (H) 09/09/2021 1946   PHURINE 7.0 09/09/2021 1946   GLUCOSEU NEGATIVE 09/09/2021 1946   HGBUR NEGATIVE 09/09/2021 [place] NEGATIVE 09/09/2021 1946   BILIRUBINUR Negative 06/07/2021 [place] 09/09/2021 1946   PROTEINUR 30 (A) 09/09/2021 1946   NITRITE NEGATIVE 09/09/2021 1946   LEUKOCYTESUR NEGATIVE 09/09/2021 1946   Sepsis Labs Recent Labs  Lab 09/26/21 1900 09/27/21 0546 09/28/21 0555  WBC 6.0 7.4 8.1   Microbiology Recent Results (from the past 240 hour(s))  SARS Coronavirus 2 by RT PCR (hospital order, performed in [place] hospital lab) *cepheid single result test* Anterior Nasal Swab     Status: None   Collection Time: 09/26/21  7:00 PM   Specimen: Anterior Nasal Swab  Result Value Ref Range Status   SARS Coronavirus 2 by RT PCR NEGATIVE NEGATIVE Final    Comment: (NOTE) SARS-CoV-2 target nucleic acids are NOT DETECTED.  The SARS-CoV-2 RNA is generally detectable in upper and lower respiratory specimens during the acute phase of infection. The lowest concentration of SARS-CoV-2 viral copies this assay can detect is 250 copies / mL. A negative result does not preclude SARS-CoV-2 infection and  should not be used as the sole basis for treatment or other patient management decisions.  A negative result may occur with improper specimen collection / handling, submission of specimen other than nasopharyngeal swab, presence of viral mutation(s) within the areas targeted by this assay, and inadequate number of viral copies (<250 copies / mL). A negative result must be combined with clinical observations, patient history, and epidemiological information.  Fact Sheet for Patients:   https://www.patel.info/  Fact Sheet for Healthcare Providers: https://hall.com/  This test is not yet approved or  cleared by the [place] FDA and has been authorized for detection and/or diagnosis of SARS-CoV-2 by FDA under an Emergency Use Authorization (EUA).  This EUA will remain in effect (meaning this test can be used) for the duration of  the COVID-19 declaration under Section 564(b)(1) of the Act, 21 U.S.C. section 360bbb-3(b)(1), unless the authorization is terminated or revoked sooner.  Performed at [facility], [address], [place], [place] [address]      Time coordinating discharge: 35 minutes  SIGNED:   Rodena Goldmann, DO [facility] 09/29/2021, 9:21 AM  If 7PM-7AM, please contact night-coverage [email or web address]

## 2021-09-29 NOTE — Plan of Care (Signed)
  Problem: Education: Goal: Knowledge of General Education information will improve Description: Including pain rating scale, medication(s)/side effects and non-pharmacologic comfort measures 09/29/2021 1410 by [name] Lighter, RN Outcome: Adequate for Discharge 09/29/2021 1049 by [name] Lighter, RN Outcome: Progressing 09/29/2021 0828 by [name] Lighter, RN Outcome: Progressing   Problem: Health Behavior/Discharge Planning: Goal: Ability to manage health-related needs will improve 09/29/2021 1410 by [name] Lighter, RN Outcome: Adequate for Discharge 09/29/2021 0828 by [name] Lighter, RN Outcome: Progressing   Problem: Clinical Measurements: Goal: Ability to maintain clinical measurements within normal limits will improve Outcome: Adequate for Discharge Goal: Will remain free from infection Outcome: Adequate for Discharge Goal: Diagnostic test results will improve Outcome: Adequate for Discharge Goal: Respiratory complications will improve Outcome: Adequate for Discharge Goal: Cardiovascular complication will be avoided Outcome: Adequate for Discharge   Problem: Activity: Goal: Risk for activity intolerance will decrease Outcome: Adequate for Discharge   Problem: Nutrition: Goal: Adequate nutrition will be maintained Outcome: Adequate for Discharge   Problem: Coping: Goal: Level of anxiety will decrease Outcome: Adequate for Discharge   Problem: Elimination: Goal: Will not experience complications related to bowel motility Outcome: Adequate for Discharge Goal: Will not experience complications related to urinary retention Outcome: Adequate for Discharge   Problem: Pain Managment: Goal: General experience of comfort will improve Outcome: Adequate for Discharge   Problem: Safety: Goal: Ability to remain free from injury will improve Outcome: Adequate for Discharge   Problem: Skin Integrity: Goal: Risk for impaired skin integrity will decrease Outcome: Adequate for  Discharge   Problem: Education: Goal: Knowledge of disease or condition will improve Outcome: Adequate for Discharge Goal: Knowledge of the prescribed therapeutic regimen will improve Outcome: Adequate for Discharge Goal: Individualized Educational Video(s) Outcome: Adequate for Discharge   Problem: Activity: Goal: Ability to tolerate increased activity will improve Outcome: Adequate for Discharge Goal: Will verbalize the importance of balancing activity with adequate rest periods Outcome: Adequate for Discharge   Problem: Respiratory: Goal: Ability to maintain a clear airway will improve Outcome: Adequate for Discharge Goal: Levels of oxygenation will improve Outcome: Adequate for Discharge Goal: Ability to maintain adequate ventilation will improve Outcome: Adequate for Discharge   Problem: Activity: Goal: Ability to tolerate increased activity will improve Outcome: Adequate for Discharge   Problem: Clinical Measurements: Goal: Ability to maintain a body temperature in the normal range will improve Outcome: Adequate for Discharge   Problem: Respiratory: Goal: Ability to maintain adequate ventilation will improve Outcome: Adequate for Discharge Goal: Ability to maintain a clear airway will improve Outcome: Adequate for Discharge

## 2021-09-29 NOTE — Telephone Encounter (Signed)
Received a referral from [facility] requesting an EUS appointment for   Dilated cbd, acquired   Chronic pancreatitis, unspecified pancreatitis type ([place])  Please review and advice if an OV is needed first?

## 2021-09-29 NOTE — Plan of Care (Signed)
  Problem: Education: Goal: Knowledge of General Education information will improve Description Including pain rating scale, medication(s)/side effects and non-pharmacologic comfort measures Outcome: Progressing   Problem: Health Behavior/Discharge Planning: Goal: Ability to manage health-related needs will improve Outcome: Progressing   

## 2021-09-29 NOTE — Care Management Important Message (Signed)
Important Message  Patient Details  Name: Jasmine [name] MRN: [id number] Date of Birth: [birth year]   Medicare Important Message Given:  N/A - LOS <3 / Initial given by admissions     [name] 09/29/2021, 11:09 AM

## 2021-09-30 ENCOUNTER — Other Ambulatory Visit: Payer: Self-pay | Admitting: *Deleted

## 2021-09-30 NOTE — Patient Outreach (Signed)
  Care Coordination TOC Note Transition Care Management Follow-up Telephone Call Date of discharge and from where: [id number] [facility] How have you been since you were released from the hospital? I feel short of breath but that is normal for me Any questions or concerns? No  Items Reviewed: Did the pt receive and understand the discharge instructions provided? Yes  Medications obtained and verified? Yes  Other? No  Any new allergies since your discharge? No  Dietary orders reviewed? No Do you have support at home? Yes   Home Care and Equipment/Supplies: Were home health services ordered? no If so, what is the name of the agency? N   Has the agency set up a time to come to the patient's home? not applicable Were any new equipment or medical supplies ordered?  No What is the name of the medical supply agency? N/a  Were you able to get the supplies/equipment? not applicable Do you have any questions related to the use of the equipment or supplies? No  Functional Questionnaire: (I = Independent and D = Dependent) ADLs: I  Bathing/Dressing- I  Meal Prep- I  Eating- I  Maintaining continence- I  Transferring/Ambulation- I  Managing Meds- I  Follow up appointments reviewed:  PCP Hospital f/u appt confirmed? No   Specialist Hospital f/u appt confirmed? Y Scheduled to see Dr Melvyn Novas [phone] 3:30 Are transportation arrangements needed? No  If their condition worsens, is the pt aware to call PCP or go to the Emergency Dept.? Yes Was the patient provided with contact information for the PCP's office or ED? Yes Was to pt encouraged to call back with questions or concerns? Yes  SDOH assessments and interventions completed:   Yes  Care Coordination Interventions Activated:  Yes Care Coordination Interventions:  PCP follow up appointment requested  Encounter Outcome:  Pt. Visit Completed

## 2021-09-30 NOTE — Telephone Encounter (Signed)
Pt's husband is returning your call  [phone]

## 2021-09-30 NOTE — Telephone Encounter (Signed)
I have reviewed the patient's chart. 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  She is on chronic narcotic therapy which could be an exacerbating factor to her bile duct dilation though she continues to have her gallbladder in place without overt cholelithiasis.  She has a history of chronic home O2 use and is on up to 4 L of oxygen per last GI note a few months ago.  Looks like she also has been dealing with acute on chronic respiratory issues and may have a lesion in the lungs as well. This patient should be evaluated by DJ or myself before an endoscopic ultrasound is scheduled due to her high risk nature and potential need for intubation if she were to undergo an EUS.  This has been a longstanding process for her. 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  Thus the diagnosis of chronic pancreatitis does seem concerning.  Overt pancreatitis episodes however do not look to have played a role more recently. Please let the referring team know that she needs to be seen by Dr. Ardis Hughs or myself in clinic before she is scheduled for an EUS due to her chronic issues. Thanks. GM

## 2021-09-30 NOTE — Telephone Encounter (Signed)
Left message on machine to call back  

## 2021-09-30 NOTE — Telephone Encounter (Signed)
Appt has been made for 10/31/21 at 930 am with Dr Ardis Hughs.  Please see note regarding need for updated CT scan.   Left message on patient machine to call back -letter will also be mailed with appt info to the pt home

## 2021-09-30 NOTE — Patient Outreach (Signed)
  Care Coordination [facility] Note Transition Care Management Unsuccessful Follow-up Telephone Call  Date of discharge and from where:  [phone] [facility]  Attempts:  1st Attempt  Reason for unsuccessful TCM follow-up call:  Left voice message  [place] Management [phone]

## 2021-09-30 NOTE — Telephone Encounter (Signed)
The pt and her husband have been advised of the office visit with Dr Ardis Hughs

## 2021-09-30 NOTE — Telephone Encounter (Signed)
Please see below [place]. Thanks!

## 2021-10-04 NOTE — Telephone Encounter (Signed)
Appt scheduled and sent to pt via mychart

## 2021-10-04 NOTE — Addendum Note (Signed)
Addended by: Cheron Every on: 10/04/2021 07:57 AM   Modules accepted: Orders

## 2021-10-06 ENCOUNTER — Telehealth: Payer: Self-pay | Admitting: Family Medicine

## 2021-10-07 ENCOUNTER — Other Ambulatory Visit: Payer: Self-pay

## 2021-10-07 ENCOUNTER — Encounter: Payer: Self-pay | Admitting: Family Medicine

## 2021-10-07 ENCOUNTER — Other Ambulatory Visit: Payer: Self-pay | Admitting: Family Medicine

## 2021-10-07 ENCOUNTER — Telehealth: Payer: Self-pay | Admitting: Family Medicine

## 2021-10-07 MED ORDER — ALBUTEROL SULFATE HFA 108 (90 BASE) MCG/ACT IN AERS
2.0000 | INHALATION_SPRAY | Freq: Four times a day (QID) | RESPIRATORY_TRACT | 0 refills | Status: DC | PRN
Start: 2021-10-07 — End: 2021-10-19

## 2021-10-07 NOTE — Telephone Encounter (Signed)
  Prescription Request  10/07/2021  Is this a "Controlled Substance" medicine? no  Have you seen your PCP in the last 2 weeks? 09/26/2021  If YES, route message to pool  -  If NO, patient needs to be scheduled for appointment.  What is the name of the medication or equipment? Albuterol inhaler  Have you contacted your pharmacy to request a refill? yes   Which pharmacy would you like this sent to? [facility]   Patient notified that their request is being sent to the clinical staff for review and that they should receive a response within 2 business days.

## 2021-10-07 NOTE — Telephone Encounter (Signed)
Pt aware refill sent to [facility]

## 2021-10-07 NOTE — Telephone Encounter (Signed)
Checking w/ Colletta [place] re: call details, tcb

## 2021-10-10 ENCOUNTER — Encounter: Payer: Self-pay | Admitting: *Deleted

## 2021-10-10 NOTE — Telephone Encounter (Signed)
This encounter was created in error - please disregard.

## 2021-10-13 ENCOUNTER — Encounter: Payer: Self-pay | Admitting: Family Medicine

## 2021-10-13 ENCOUNTER — Telehealth: Payer: Self-pay | Admitting: Family Medicine

## 2021-10-13 NOTE — Telephone Encounter (Signed)
Refill request handled in a MyChart message, since pt picked up refill on 10/07/21 and is out was instructed to make an appt, schedule to be seen tomorrow 10/14/21

## 2021-10-13 NOTE — Telephone Encounter (Signed)
  Prescription Request  10/13/2021  Is this a "Controlled Substance" medicine? NO  Have you seen your PCP in the last 2 weeks? NO  If YES, route message to pool  -  If NO, patient needs to be scheduled for appointment.  What is the name of the medication or equipment? Albuterol 108 (90 Base)  Have you contacted your pharmacy to request a refill? NO   Which pharmacy would you like this sent to? [facility]   Patient notified that their request is being sent to the clinical staff for review and that they should receive a response within 2 business days.

## 2021-10-14 ENCOUNTER — Other Ambulatory Visit: Payer: Self-pay | Admitting: Family Medicine

## 2021-10-14 ENCOUNTER — Telehealth: Payer: Self-pay | Admitting: Family Medicine

## 2021-10-14 ENCOUNTER — Ambulatory Visit: Payer: Medicare Other | Admitting: Family Medicine

## 2021-10-14 DIAGNOSIS — F418 Other specified anxiety disorders: Secondary | ICD-10-CM

## 2021-10-14 DIAGNOSIS — F321 Major depressive disorder, single episode, moderate: Secondary | ICD-10-CM

## 2021-10-14 DIAGNOSIS — G479 Sleep disorder, unspecified: Secondary | ICD-10-CM

## 2021-10-14 NOTE — Telephone Encounter (Signed)
PT SCHEDULED

## 2021-10-19 ENCOUNTER — Encounter: Payer: Self-pay | Admitting: Internal Medicine

## 2021-10-19 ENCOUNTER — Other Ambulatory Visit: Payer: Self-pay

## 2021-10-19 ENCOUNTER — Ambulatory Visit (INDEPENDENT_AMBULATORY_CARE_PROVIDER_SITE_OTHER): Payer: Medicare Other | Admitting: Internal Medicine

## 2021-10-19 DIAGNOSIS — J9611 Chronic respiratory failure with hypoxia: Secondary | ICD-10-CM

## 2021-10-19 DIAGNOSIS — F17210 Nicotine dependence, cigarettes, uncomplicated: Secondary | ICD-10-CM

## 2021-10-19 DIAGNOSIS — R911 Solitary pulmonary nodule: Secondary | ICD-10-CM | POA: Diagnosis not present

## 2021-10-19 DIAGNOSIS — J449 Chronic obstructive pulmonary disease, unspecified: Secondary | ICD-10-CM

## 2021-10-19 MED ORDER — ALBUTEROL SULFATE HFA 108 (90 BASE) MCG/ACT IN AERS
2.0000 | INHALATION_SPRAY | Freq: Four times a day (QID) | RESPIRATORY_TRACT | 0 refills | Status: DC | PRN
Start: 2021-10-19 — End: 2021-11-02

## 2021-10-19 MED ORDER — BREZTRI AEROSPHERE 160-9-4.8 MCG/ACT IN AERO: 2.0000 | INHALATION_SPRAY | Freq: Two times a day (BID) | RESPIRATORY_TRACT | 11 refills | Status: DC

## 2021-10-19 MED ORDER — PREDNISONE 10 MG PO TABS: ORAL_TABLET | ORAL | 11 refills | Status: DC

## 2021-10-19 NOTE — Assessment & Plan Note (Signed)
4-5 min discussion re active cigarette smoking in addition to office E&M  Ask about tobacco use: ongoing Advise quitting I emphasized that although we never turn away smokers from the pulmonary clinic, we do ask that they understand that the recommendations that we make  won't work nearly as well in the presence of continued cigarette exposure. In fact, we may very well  reach a point where we can't promise to help the patient if he/she can't quit smoking. (We can and will promise to try to help, we just can't promise what we recommend will really work)  Assess willingness:  ? Fully committed at this point Assist in quit attempt:  Per PCP when ready Arrange follow up:   Follow up per Primary Care planned

## 2021-10-19 NOTE — Progress Notes (Unsigned)
Subjective:     Patient ID: Jasmine [name], female   DOB: 1955/07/13    MRN: [id number]    Brief patient profile: 27    yowf   active smoker/MM  dx with asthma as child but by HS outgrew it until her 26's with flares but better between until early 2000s and maintain on multliple inhalers and then 02/2016 hosp in [place] then [facility] April 2019 and newly started on 02 = 2lpm as needed daytime and hs and referred to pulmonary clinic 08/24/2017 by Dr   Jasmine [name] with GOLD II spirometry documented 09/21/17     Admit date: 06/30/2017 Discharge date: 07/09/2017     Recommendations for Outpatient Follow-up:  pls refer to sleep medicine for osa testing/epworht/stopbang pls continue cessation counseling for Tob Needs cxr 1 mo Consider Megace/chantix? Recheck LFT and Magnesium in 1-2 weeks---has elevated LFT unknown sig [?CVC liver]   Discharge Diagnoses:  Principal Problem:   Acute respiratory failure with hypoxia (HCC) Active Problems:   Chronic back pain   HTN (hypertension)   Chronic narcotic use   Hypokalemia   COPD with acute exacerbation (HCC)   Malnutrition of moderate degree     Discharge Condition: please refert   Diet recommendation: eat anything to gain weight for now        [facility] Weights    06/30/17 2122 07/01/17 0500 07/02/17 0412  Weight: 47.2 kg (104 lb 0.9 oz) 47.2 kg (104 lb 0.9 oz) 48.1 kg (106 lb 0.7 oz)      History of present illness:  Please see note from Dr. Paulene [name] "66 y.o. female with a history of COPD, GERD, neuropathy, chronic pain on chronic narcotics, hypertension.  Patient presents with worsening shortness of breath over the past 3 to 4 weeks.  She presented to ED with acute respiratory distress with hypoxia and subsequently started on bipap"       Hospital Course:  Acute respiratory failure with hypoxia-patient says that she responded very well to nightly BiPAP.  She was able to sleep for the first time in a very long time. ?OSA-referrinf  to Sleep as OP.  Needs screening fro pulm htn as well? Could benefit from Cardio-pulm rehab if avail  [place] COPD [place] exacerbation- continue current treatments with IV steroids, antibiotics and continuous around-the-clock neb treatments-narrowed and tapered on d/c home--5 day steroid taper Essential Hypertension-stable. Hypokalemia-replacement ordered.  recheck mag as op Chronic pain with opioid dependence-resume home pain med management regimen which has been stable per patient.          08/24/2017 1st [facility] office visit/ Jasmine [name]   Chief Complaint  Patient presents with   Pulmonary Consult    Referred by Dr. Adam [name].  Pt states dxed with COPD approx 10 years ago. She states she was hospitalized in April 2019 for COPD flare and sent home with o2.  She states she gets SOB with minimal exertion such as walking from room to room at home. She uses proair 3 x per wk on average and has duoneb but has not needed it since starting o2.    MMRC3 = can't walk 100 yards even at a slow pace at a flat grade s stopping due to sob  Even on 02  Rattling cough more prod first thing in am = min mucoid  maint on symbicort 160    Off prednisone x sev weeks and about the same since stopped just on symbicort 160 2bid  And  2lpm with activity  Sleeping  2lpm and 30 degrees    rec Plan A = Automatic =   Symbicort 160  Take 2 puffs first thing in am and then another 2 puffs about 12 hours later and spiriva 2 pffs each am  Work on inhaler technique:     Plan B = Backup Only use your albuterol (Proair)as a rescue medication Plan C = Crisis - only use your albuterol nebulizer if you first try Plan B      08/05/2019  f/u ov/Jasmine [name] re:  GOLD II/ symb /spiriva still smoking  - had [place] vaccine  Chief Complaint  Patient presents with   Follow-up    Breathing has been worse over the past 2 months- she is waking up in the morning feeling SOB and has to use her rescue inhaler. She is  using the rescue inhaler most days.   Dyspnea: foot slows her down> sob  Cough: minimal in am / white  Sleeping: on 02 flat bed  SABA use: not using symb first thing as rec and uses saba first  02: 2lpm hs only / wants portable system rec Plan A = Automatic = Always=    symbicort then spiriva first thing then symbicot 12 hours later  Work on inhaler technique:  Plan B = Backup (to supplement plan A, not to replace it) Only use your albuterol inhaler as a rescue medication Plan C = Crisis (instead of Plan B but only if Plan B stops working) - only use your albuterol nebulizer if you first try Plan B and it fails to help > ok to use the nebulizer up to every 4 hours but if start needing it regularly call for immediate appointment The key is to stop smoking completely before smoking completely stops you!       02/19/2020  f/u ov/[place] office/Jasmine [name] re: GOLD II/ 02 dep still smoking Chief Complaint  Patient presents with   Follow-up    Here for o2 recert- c/o waking up in the night coughing and "gagging" x 3 months. Cough is occ prod with clear sputum. She is using her albuterol inhaler about once per day.   Dyspnea: foot drop limiting / around the house doe  Cough: nightly cough/ gag 4 h after hs  and continues until gets up and uses saba in am / min clear mucus  Sleeping: bed is flat with wedge  SABA use: as above/ no neb  02: 2lpm hs  On ppi with breakfast  Rec Protonix 40 mg Take 30- 60 min before your first and last meals of the day  GERD diet reviewed, bed blocks rec  We will be scheduling you ONO on room air to qualify you for 02  Please remember to go to the  x-ray department  pos SPN CTchest 03/19/20 : 1. 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 After surgery and lately she reports a cough with clear mucus.  - MR   Dyspnea:  limited by R foot drop / using rollator  Cough: still coughing thick clear mucus esp in am / using delyms  Sleeping: bed is flat/ some am congestion  SABA use: too much  02: 4lpm 24/7  Rec For cough/ congestion > mucinex dm 1200 mg every 12 hours as needed and use flutter valve  The key is to stop smoking completely before smoking completely stops you! Plan A = Automatic = Always=  Symbicort 160 / spiriva 2 of each then 12 hours later 2 puffs of symbicort  Plan B = Backup (to supplement plan A, not to replace it) Only use your albuterol inhaler as a rescue medication  Plan C = Crisis (instead of Plan B but only if Plan B stops working) - only use your albuterol nebulizer if you first try Plan B and it fails to help  Ok to try albuterol 15 min before an activity (on alternating days)  that you know would usually make you short of breath  We will contact [facility] to do a best fit evaluation  - late add did not need amb 02 based on today's eval   Make sure you check your oxygen saturation  at your highest level of activity  to be sure it stays over 90% and adjust  02 flow upward to maintain this level if needed but remember to turn it back to previous settings when you stop (to conserve your supply).   Please schedule a follow up visit in 3 months but call sooner if needed   Admit date: 09/26/2021   Discharge date: 09/29/2021   Admitted From:Home     Brief/Interim Summary: Jasmine [name] is a 66 y.o. female with medical history significant of allergies, anemia, COPD, smoker, GERD, hypertension, lung cancer,  and more presents ED with a chief complaint of dyspnea.  Patient has been admitted for acute COPD exacerbation and remained on her usual 5 L nasal cannula oxygen, but required several days of IV Solu-Medrol and breathing treatments as well as brief use of Zithromax to help improve her symptoms.  Chest x-ray did not demonstrate any acute findings.  She now feels as though she is back to baseline and is in stable condition for discharge and will resume breathing treatments as needed at home and will continue on prednisone as prescribed for several more days.  She was also seen by palliative care while admitted and will be set up with outpatient palliative services.   Discharge Diagnoses:  Principal Problem:   COPD exacerbation ([place]) Active Problems:   HTN (hypertension)   COPD with acute exacerbation (HCC)   Cigarette smoker   Chronic respiratory failure with hypoxia (HCC)   GERD (gastroesophageal reflux disease)   Chronic pain syndrome   Chronic pancreatitis ([place])   Hyperlipidemia  10/19/2021  f/u ov/[place] office/Alyvia Derk re: GOLD 2/ 02 dep  maint on wix/spiriva   Chief Complaint  Patient presents with   Follow-up    On 5LO2 cont 24/7  Feels breathing is worse since last ov.  On wixella inhaler needs refill on albuterol   Dyspnea: Rollator due R sided weakness  Cough: rattling /congested sounding cough  Sleeping: recliner x 45 degrees x 3 months  SABA use: albuterol 8 x days/ neb twice daily not prechallenging  02: 5lpm 24/7  No obvious day to day or daytime variability or assoc excess/ purulent sputum or mucus plugs or hemoptysis or cp or chest tightness, subjective wheeze or overt sinus or hb symptoms.   *** without nocturnal  or early am exacerbation  of respiratory  c/o's or need for noct saba. Also denies any obvious fluctuation of symptoms with weather or environmental changes or other aggravating or alleviating factors except as outlined above   No unusual exposure hx or  h/o childhood pna/ asthma or knowledge of premature birth.  Current Allergies, Complete Past Medical History, Past Surgical History, Family History, and Social History were reviewed in [employer] record.  ROS  The following are not active complaints unless bolded Hoarseness, sore throat, dysphagia, dental problems, itching, sneezing,  nasal congestion or discharge of excess mucus or purulent secretions, ear ache,   fever, chills, sweats, unintended wt loss or wt gain, classically pleuritic or exertional cp,  orthopnea pnd or arm/hand swelling  or leg swelling, presyncope, palpitations, abdominal pain, anorexia, nausea, vomiting, diarrhea  or change in bowel habits or change in bladder habits, change in stools or change in urine, dysuria, hematuria,  rash, arthralgias, visual complaints, headache, numbness, weakness or ataxia or problems with walking or coordination,  change in mood or  memory.        Current Meds  Medication Sig   albuterol (VENTOLIN HFA) 108 (90 Base) MCG/ACT inhaler Inhale 2 puffs into the lungs every 6 (six) hours as needed for wheezing or shortness of breath.   amLODipine (NORVASC) 10 MG tablet Take 1 tablet (10 mg total) by mouth daily.   atorvastatin (LIPITOR) 40 MG tablet Take 1 tablet (40 mg total) by mouth daily.   bisoprolol (ZEBETA) 5 MG tablet Take 0.5 tablets (2.5 mg total) by mouth daily. Take in Place of Metoprolol   dextromethorphan (DELSYM) 30 MG/5ML liquid Take 15 mg by mouth as needed for cough.   diclofenac Sodium (VOLTAREN) 1 % GEL Apply 2 g topically 4 (four) times daily.   fluticasone-salmeterol (ADVAIR DISKUS) 250-50 MCG/ACT AEPB Inhale 1 puff into the lungs in the morning and at bedtime. (Patient taking differently: Inhale 1 puff into the lungs daily.)   lipase/protease/amylase 24000-76000 units CPEP Take 2 caps daily with meals and 1 cap with snacks (Patient taking differently: Take 1-2 capsules by mouth See admin instructions. Take  2 capsules by mouth daily with meals; and take 1 capsule by mouth with snacks.)   mirtazapine (REMERON) 15 MG tablet Take 1 tablet (15 mg total) by mouth at bedtime.   Nutritional Supplements (ENSURE HIGH PROTEIN) LIQD Drink 1 can 3 times daily.   omeprazole (PRILOSEC) 40 MG capsule Take 1 capsule (40 mg total) by mouth 2 (two) times daily with a meal.   oxybutynin (DITROPAN-XL) 5 MG 24 hr tablet TAKE 1 TABLET AT BEDTIME (Patient taking differently: Take 5 mg by mouth at bedtime.)   oxyCODONE-acetaminophen (PERCOCET) 10-325 MG tablet Take 1 tablet by mouth 5 (five) times daily.   OXYCONTIN 15 MG 12 hr tablet Take 15 mg by mouth every 12 (twelve) hours.   OXYGEN Inhale 4 L into the lungs See admin instructions. continuous   prochlorperazine (COMPAZINE) 25 MG suppository Place 1 suppository (25 mg total) rectally every 12 (twelve) hours as needed for nausea or vomiting.   Tiotropium Bromide Monohydrate (SPIRIVA RESPIMAT) 2.5 MCG/ACT AERS Inhale 2 puffs into the lungs daily.   tiZANidine (ZANAFLEX) 4 MG capsule Take 1 capsule (4 mg total) by  mouth 3 (three) times daily as needed for muscle spasms.   traZODone (DESYREL) 150 MG tablet TAKE 1 TO 2 TABLETS AT BEDTIME (Patient taking differently: Take 225 mg by mouth at bedtime.)                          Objective:   Physical Exam   Wts  10/19/2021            ***  11/03/2020         92 02/19/2020       96  08/05/2019           98.6  01/09/2018        105  09/21/2017        104   08/24/17 105 lb 9.6 oz (47.9 kg)  07/31/17 106 lb (48.1 kg)  07/26/17 105 lb 3.2 oz (47.7 kg)     Vital signs reviewed  10/19/2021  - Note at rest 02 sats  ***% on ***   General appearance:    elderly wf rattling cough using rollator     Mod bar ***         CXR PA and Lateral:   02/19/2020 :    I personally reviewed images and agree with radiology impression as follows:   Did not go for cxr as rec        Assessment:

## 2021-10-19 NOTE — Patient Instructions (Addendum)
Stop wexilla and spiriva and breztri Take 2 puffs first thing in am and then another 2 puffs about 12 hours later.     For cough >  mucinex dm 1200 mg every 12 hours and use the flutter valve as much as you can - do not take delsym  Plan A = Automatic = Always=    breztri Take 2 puffs first thing in am and then another 2 puffs about 12 hours later.    Work on inhaler technique:  relax and gently blow all the way out then take a nice smooth full deep breath back in, triggering the inhaler at same time you start breathing in.  Hold breath in for at least  5 seconds if you can. Blow out breztri thru nose. Rinse and gargle with water when done.  If mouth or throat bother you at all,  try brushing teeth/gums/tongue with arm and hammer toothpaste/ make a slurry and gargle and spit out.    - remember how  golfers warm up - take practice breaths with your empty inhalers    Plan B = Backup (to supplement plan A, not to replace it) Only use your albuterol inhaler as a rescue medication to be used if you can't catch your breath by resting or doing a relaxed purse lip breathing pattern.  - The less you use it, the better it will work when you need it. - Ok to use the inhaler up to 2 puffs  every 4 hours if you must but call for appointment if use goes up over your usual need - Don't leave home without it !!  (think of it like the spare tire for your car)   Plan C = Crisis (instead of Plan B but only if Plan B stops working) - only use your albuterol nebulizer if you first try Plan B and it fails to help > ok to use the nebulizer up to every 4 hours but if start needing it regularly call for immediate appointment   Plan D = Deltasone  - if plan ABC not working > Prednisone 10 mg take  4 each am x 2 days,   2 each am x 2 days,  1 each am x 2 days and stop  ( this is a refillable prescription)   The key is to stop smoking completely before smoking completely stops you!      Please schedule a follow up  office visit in 2 weeks, sooner if needed  with all medications /inhalers/ solutions in hand so we can verify exactly what you are taking. This includes all medications from all doctors and over the counters     .

## 2021-10-20 ENCOUNTER — Encounter: Payer: Self-pay | Admitting: Internal Medicine

## 2021-10-20 NOTE — Assessment & Plan Note (Addendum)
Active smoker 08/24/2017  > try spiriva respimat 2.5  X 2 puffs each am  - PFT's  09/21/2017  FEV1 1.61 (72 % ) ratio 63  p 14 % improvement from saba p nothing prior to study with DLCO  55 % corrects to 62 % for alv volume   - alpha one screen 09/21/2017  MM level 191  - 11/03/2020   > continue symb/spiriva > symbicort d/c by insurance and admit x 3 - 10/19/2021  After extensive coaching inhaler device,  effectiveness =    60% baseline near 0    Group D (now reclassified as E) in terms of symptom/risk and laba/lama/ICS  therefore appropriate rx at this point >>>  Try breztri and approp saba plus Prednisone as plan D on action plan x 6 day courses for now   If not improving will see if her insurance will cover same rx as Neb   F/u in 2 weeks to prevent readmit or consider change to hospice mode (already seeing palliative care).

## 2021-10-20 NOTE — Assessment & Plan Note (Signed)
As of 08/24/2017  DME- [facility]  o2 2lpm with sleep and as needed daytime -  12/02/2018 ambulatory O2 walk test with no desaturations.   -  08/05/2019   Walked RA  approx   200 ft  @ slow pace  stopped due to  Sob/ no desats    -  02/19/2020   Walked RA  approx  24ft  @ slow pace  stopped due to  Sob/ sats still 94%    - ONO RA  03/09/2020 :   desats on RA < 89% x 2 h 39 min > 03/23/2020 rec 2lpm   - 11/03/2020 no desats walking 450 ft RA  - ONO 11/18/20  ONO RA deat < 89% x 1 h 29 min > rec 2lpm hs   02 dep much more striking than baseline, assume much of this is related to ALI from Pna with no ongoing infection apparent so should be able to taper with goal of sats > 90%

## 2021-10-20 NOTE — Assessment & Plan Note (Addendum)
Active smoker - 1st detected on cxr 02/24/20 - CTchest 03/19/20 : 1. 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   Will follow conservatively for now with plain cxr next ov          Each maintenance medication was reviewed in detail including emphasizing most importantly the difference between maintenance and prns and under what circumstances the prns are to be triggered using an action plan format where appropriate.  Total time for H and P, chart review, counseling, reviewing hfa/neb/02 device(s) and generating customized AVS unique to this office visit / same day charting = 36 min

## 2021-10-21 ENCOUNTER — Telehealth: Payer: Self-pay | Admitting: Internal Medicine

## 2021-10-21 NOTE — Telephone Encounter (Signed)
If not doing better by now  with breztri technique or doesn't think it's helping,  I found out she can probably get the equivalent by neb thru her insurance so if wants to pursue that, rx is   Perforomist 20 mcg q 12 h  Budesonidine 0.25mg   q 12 h and  Yopelri 175 mg q am

## 2021-10-24 NOTE — Telephone Encounter (Signed)
ATC patient. LVMTCB. 

## 2021-10-26 ENCOUNTER — Telehealth: Payer: Self-pay | Admitting: Family Medicine

## 2021-10-26 ENCOUNTER — Ambulatory Visit (INDEPENDENT_AMBULATORY_CARE_PROVIDER_SITE_OTHER): Payer: Medicare Other | Admitting: Family Medicine

## 2021-10-26 ENCOUNTER — Encounter: Payer: Self-pay | Admitting: Family Medicine

## 2021-10-26 VITALS — BP 115/63 | HR 82 | Temp 97.2°F | Resp 20 | Ht 63.0 in | Wt 107.0 lb

## 2021-10-26 DIAGNOSIS — N3281 Overactive bladder: Secondary | ICD-10-CM

## 2021-10-26 DIAGNOSIS — K1379 Other lesions of oral mucosa: Secondary | ICD-10-CM | POA: Diagnosis not present

## 2021-10-26 DIAGNOSIS — J9611 Chronic respiratory failure with hypoxia: Secondary | ICD-10-CM | POA: Diagnosis not present

## 2021-10-26 DIAGNOSIS — G8929 Other chronic pain: Secondary | ICD-10-CM

## 2021-10-26 DIAGNOSIS — R682 Dry mouth, unspecified: Secondary | ICD-10-CM

## 2021-10-26 MED ORDER — MAGIC MOUTHWASH: 5.0000 mL | Freq: Three times a day (TID) | ORAL | 99 refills | Status: DC | PRN

## 2021-10-26 MED ORDER — BREZTRI AEROSPHERE 160-9-4.8 MCG/ACT IN AERO: 2.0000 | INHALATION_SPRAY | Freq: Two times a day (BID) | RESPIRATORY_TRACT | 11 refills | Status: DC

## 2021-10-26 MED ORDER — MIRABEGRON ER 25 MG PO TB24: 25.0000 mg | ORAL_TABLET | Freq: Every day | ORAL | 0 refills | Status: DC

## 2021-10-26 NOTE — Telephone Encounter (Signed)
Spoke with patients husband Jeneen Rinks and he states Breztri inhaler is helping patient. He asked that [facility]. Nothing further needed at this time.

## 2021-10-26 NOTE — Progress Notes (Addendum)
Subjective: CC:Hospital discharge follow up PCP: Janora Norlander, DO ATF:[id number] C Squier is a 66 y.o. female presenting to clinic today for:  1.  Chronic respiratory failure with hypoxia secondary to COPD Patient was admitted again for complications of her COPD/respiratory failure.  She has seen her pulmonologist outpatient who is planning on seeing her at the end of the month for recheck.  He has recommended consideration for transition to hospice care given frequent readmissions and decline.  She notes that she is feeling better after hospitalization.  She had had some left rib pain but that seems to be getting better.  She has close follow-up with her specialist and will also be seeing Dr. Delton Coombes soon.  She anticipates they will be repeating imaging.  She continues to have quite a bit of difficulty getting up from seated positions and or out of bed due to weakness, shortness of breath and decreased endurance.  She is asking for a lift chair to assist.  Her main complaint today is not respiratory nature but rather oral.  She has had some increased oral irritation that is not solved by use of the Magic mouthwash with lidocaine.  She prefers the kind without lidocaine that she can swallow it.  This seemed to work better for her in the past  She also complains of overactive bladder.  She has been using Ditropan extended release 10 mg daily but really still has a lot of trouble.  She would like to try something else.   ROS: Per HPI  Allergies  Allergen Reactions   Lyrica [Pregabalin] Other (See Comments)    EXTREME SHAKING/TREMBLING   Darvon [Propoxyphene]     UNSPECIFIED REACTION    Gabapentin     Extreme shaking and trembling    Augmentin [Amoxicillin-Pot Clavulanate] Nausea And Vomiting    Has patient had a PCN reaction causing immediate rash, facial/tongue/throat swelling, SOB or lightheadedness with hypotension:no Has patient had a PCN reaction causing severe rash involving  mucus membranes or skin necrosis:No Has patient had a PCN reaction that required hospitalization:No Has patient had a PCN reaction occurring within the last 10 years:Yes--ONLY N/V If all of the above answers are "NO", then may proceed with Cephalosporin use.    Erythromycin Itching and Rash        Vibramycin [Doxycycline Calcium] Itching and Rash    "[place] "   Past Medical History:  Diagnosis Date   Allergy    Anemia    Anxiety    Arthritis    Asthma    Carpal tunnel syndrome    Chronic back pain    Cigarette smoker 2/54/2706   Complication of anesthesia    in the past has had N/V, the last few surgeries she has not been   COPD (chronic obstructive pulmonary disease) ([place])    Easy bruising 07/10/2016   GERD (gastroesophageal reflux disease)    Headache    Heart murmur 2005   never had any problems   History of blood transfusion 2018   History of kidney stones    Hypertension    Hypoglycemia    Hypoglycemia    Iron deficiency anemia 07/11/2016   Neuropathy    both arms   Normocytic anemia 07/29/2015   Pancreatitis    Pneumonia    PONV (postoperative nausea and vomiting)    Postoperative anemia due to acute blood loss 11/15/2015   R lung cancer    Sciatica    Seizures ([place])    as a  child when she would have an asthma attack. none as an adult   Shortness of breath dyspnea     Current Outpatient Medications:    albuterol (VENTOLIN HFA) 108 (90 Base) MCG/ACT inhaler, Inhale 2 puffs into the lungs every 6 (six) hours as needed for wheezing or shortness of breath., Disp: 18 g, Rfl: 0   amLODipine (NORVASC) 10 MG tablet, Take 1 tablet (10 mg total) by mouth daily., Disp: 90 tablet, Rfl: 3   atorvastatin (LIPITOR) 40 MG tablet, Take 1 tablet (40 mg total) by mouth daily., Disp: 90 tablet, Rfl: 3   bisoprolol (ZEBETA) 5 MG tablet, Take 0.5 tablets (2.5 mg total) by mouth daily. Take in Place of Metoprolol, Disp: 45 tablet, Rfl: 2   Budeson-Glycopyrrol-Formoterol (BREZTRI  AEROSPHERE) 160-9-4.8 MCG/ACT AERO, Inhale 2 puffs into the lungs 2 (two) times daily., Disp: 10.7 g, Rfl: 11   dextromethorphan (DELSYM) 30 MG/5ML liquid, Take 15 mg by mouth as needed for cough., Disp: , Rfl:    diclofenac Sodium (VOLTAREN) 1 % GEL, Apply 2 g topically 4 (four) times daily., Disp: , Rfl:    lipase/protease/amylase 24000-76000 units CPEP, Take 2 caps daily with meals and 1 cap with snacks (Patient taking differently: Take 1-2 capsules by mouth See admin instructions. Take 2 capsules by mouth daily with meals; and take 1 capsule by mouth with snacks.), Disp: 270 capsule, Rfl: 2   mirtazapine (REMERON) 15 MG tablet, Take 1 tablet (15 mg total) by mouth at bedtime., Disp: 90 tablet, Rfl: 3   Nutritional Supplements (ENSURE HIGH PROTEIN) LIQD, Drink 1 can 3 times daily., Disp: 21330 mL, Rfl: PRN   omeprazole (PRILOSEC) 40 MG capsule, Take 1 capsule (40 mg total) by mouth 2 (two) times daily with a meal., Disp: 180 capsule, Rfl: 3   oxybutynin (DITROPAN-XL) 5 MG 24 hr tablet, TAKE 1 TABLET AT BEDTIME (Patient taking differently: Take 5 mg by mouth at bedtime.), Disp: 90 tablet, Rfl: 3   oxyCODONE-acetaminophen (PERCOCET) 10-325 MG tablet, Take 1 tablet by mouth 5 (five) times daily., Disp: , Rfl:    OXYCONTIN 15 MG 12 hr tablet, Take 15 mg by mouth every 12 (twelve) hours., Disp: , Rfl:    OXYGEN, Inhale 4 L into the lungs See admin instructions. continuous, Disp: , Rfl:    predniSONE (DELTASONE) 10 MG tablet, Take  4 each am x 2 days,   2 each am x 2 days,  1 each am x 2 days and stop, Disp: 14 tablet, Rfl: 11   prochlorperazine (COMPAZINE) 25 MG suppository, Place 1 suppository (25 mg total) rectally every 12 (twelve) hours as needed for nausea or vomiting., Disp: 12 suppository, Rfl: 3   tiZANidine (ZANAFLEX) 4 MG capsule, Take 1 capsule (4 mg total) by mouth 3 (three) times daily as needed for muscle spasms., Disp: 30 capsule, Rfl: 1   traZODone (DESYREL) 150 MG tablet, TAKE 1 TO 2  TABLETS AT BEDTIME (Patient taking differently: Take 225 mg by mouth at bedtime.), Disp: 180 tablet, Rfl: 3 Social History   Socioeconomic History   Marital status: Married    Spouse name: [name]   Number of children: 1   Years of education: Not on file   Highest education level: Some college, no degree  Occupational History   Occupation: disability  Tobacco Use   Smoking status: Every Day    Packs/day: 0.25    Years: 35.00    Total pack years: 8.75    Types: Cigarettes  Smokeless tobacco: Never   Tobacco comments:    trying to quit  Vaping Use   Vaping Use: Never used  Substance and Sexual Activity   Alcohol use: Yes    Alcohol/week: 2.0 standard drinks of alcohol    Types: 2 Glasses of wine per week    Comment: occasionally   Drug use: No   Sexual activity: Yes    Birth control/protection: None  Other Topics Concern   Not on file  Social History Narrative   Retired, 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 1 son, deceased. 2 pet dogs. Enjoys watching TV.   Husband takes care of cooking, cleaning, driving, and helps her with ADLs including bathing   Social Determinants of Health   Financial Resource Strain: Low Risk  (04/01/2021)   Overall Financial Resource Strain (CARDIA)    Difficulty of Paying Living Expenses: Not very hard  Food Insecurity: No Food Insecurity (04/01/2021)   Hunger Vital Sign    Worried About Running Out of Food in the Last Year: Never true    Ran Out of Food in the Last Year: Never true  Transportation Needs: No Transportation Needs (04/01/2021)   PRAPARE - Hydrologist (Medical): No    Lack of Transportation (Non-Medical): No  Physical Activity: Insufficiently Active (04/01/2021)   Exercise Vital Sign    Days of Exercise per Week: 7 days    Minutes of Exercise per Session: 10 min  Stress: No Stress Concern Present (04/01/2021)   [place]     Feeling of Stress : Not at all  Social Connections: [place] (04/01/2021)   Social Connection and Isolation Panel [NHANES]    Frequency of Communication with Friends and Family: More than three times a week    Frequency of Social Gatherings with Friends and Family: More than three times a week    Attends Religious Services: 1 to 4 times per year    Active Member of [employer] or Organizations: Yes    Attends Archivist Meetings: 1 to 4 times per year    Marital Status: Married  Human resources officer Violence: Not At Risk (04/01/2021)   Humiliation, Afraid, Rape, and Kick questionnaire    Fear of Current or Ex-Partner: No    Emotionally Abused: No    Physically Abused: No    Sexually Abused: No   Family History  Problem Relation Age of Onset   Heart disease Mother    Diabetes Mother    Hypertension Mother    Hyperlipidemia Mother    COPD Mother        smoked   Cancer Mother        bile duct   COPD Father    Diabetes Father    Heart disease Father    Cancer Father        throat   Dementia Father    Asthma Son    Hypertension Sister    Hyperlipidemia Sister    Hypertension Brother    Hyperlipidemia Brother    Hypertension Brother    Hyperlipidemia Brother    Emphysema Maternal Grandmother        smoked   Colon cancer Neg Hx    Colon polyps Neg Hx     Objective: Office vital signs reviewed. BP 115/63   Pulse 82   Temp (!) 97.2 F (36.2 C)   Resp 20   Ht 5\' 3"  (1.6 m)  Wt 107 lb (48.5 kg)   SpO2 97% Comment: on 5liters oxygen  BMI 18.95 kg/m   Physical Examination:  General: Awake, alert, chronically ill-appearing female.  Looks like she has gained a few pounds since last visit which is reassuring, No acute distress HEENT: Mouth is dry.  No thrush lesions appreciated Cardio: regular rate and rhythm, S1S2 heard, no murmurs appreciated Pulm: Global decreased breath sounds with expiratory wheezes.  Normal work of breathing on nasal cannula MSK:  Ambulating with assistance  Assessment/ Plan: 66 y.o. female   Overactive bladder - Plan: mirabegron ER (MYRBETRIQ) 25 MG TB24 tablet  Chronic secondary oral pain - Plan: magic mouthwash SOLN  Dry mouth  Chronic respiratory failure with hypoxia (HCC)  Trial of Myrbetriq.  4 weeks of samples provided.  She will contact me if this is helpful and I will be glad to prescribe  Written prescription for Magic mouthwash without lidocaine provided.  Use only as needed.  We will see if she may be a candidate for pilocarpine.  Keep close follow-up with pulmonology.  Reiterated need for smoking cessation and she is actively working on this.  Lift chair ordered and faxed to requested supply store.  Her advanced COPD is causing her to easily fatigued and have a quite a bit of difficulty getting up from a seated position without significant shortness of breath  No orders of the defined types were placed in this encounter.  No orders of the defined types were placed in this encounter.    Janora Norlander, DO [place] [phone]

## 2021-10-26 NOTE — Telephone Encounter (Signed)
Pt calling for FYI

## 2021-10-31 ENCOUNTER — Ambulatory Visit: Payer: Medicare Other | Admitting: Gastroenterology

## 2021-11-02 ENCOUNTER — Encounter: Payer: Self-pay | Admitting: Internal Medicine

## 2021-11-02 ENCOUNTER — Ambulatory Visit (INDEPENDENT_AMBULATORY_CARE_PROVIDER_SITE_OTHER): Payer: Medicare Other | Admitting: Internal Medicine

## 2021-11-02 DIAGNOSIS — J449 Chronic obstructive pulmonary disease, unspecified: Secondary | ICD-10-CM

## 2021-11-02 DIAGNOSIS — F17210 Nicotine dependence, cigarettes, uncomplicated: Secondary | ICD-10-CM

## 2021-11-02 DIAGNOSIS — R911 Solitary pulmonary nodule: Secondary | ICD-10-CM

## 2021-11-02 DIAGNOSIS — J9611 Chronic respiratory failure with hypoxia: Secondary | ICD-10-CM

## 2021-11-02 MED ORDER — ALBUTEROL SULFATE HFA 108 (90 BASE) MCG/ACT IN AERS
INHALATION_SPRAY | RESPIRATORY_TRACT | 11 refills | Status: DC
Start: 2021-11-02 — End: 2022-01-11

## 2021-11-02 NOTE — Assessment & Plan Note (Signed)
Active smoker 08/24/2017  > try spiriva respimat 2.5  X 2 puffs each am  - PFT's  09/21/2017  FEV1 1.61 (72 % ) ratio 63  p 14 % improvement from saba p nothing prior to study with DLCO  55 % corrects to 62 % for alv volume   - alpha one screen 09/21/2017  MM level 191  - 11/03/2020   > continue symb/spiriva > symbicort d/c by insurance and admit x 3 - Prednisone as plan D x 6 day courses  10/20/2021 >>>  - 11/02/2021  After extensive coaching inhaler device,  effectiveness =    60% (short ti) with hfa but doing better on breztri so continue 2bid and approp saba    Group D (now reclassified as E) in terms of symptom/risk and laba/lama/ICS  therefore appropriate rx at this point >>>  breztri and approp saba  Re SABA :  I spent extra time with pt today reviewing appropriate use of albuterol for prn use on exertion with the following points: 1) saba is for relief of sob that does not improve by walking a slower pace or resting but rather if the pt does not improve after trying this first. 2) If the pt is convinced, as many are, that saba helps recover from activity faster then it's easy to tell if this is the case by re-challenging : ie stop, take the inhaler, then p 5 minutes try the exact same activity (intensity of workload) that just caused the symptoms and see if they are substantially diminished or not after saba 3) if there is an activity that reproducibly causes the symptoms, try the saba 15 min before the activity on alternate days   If in fact the saba really does help, then fine to continue to use it prn but advised may need to look closer at the maintenance regimen being used to achieve better control of airways disease with exertion.

## 2021-11-02 NOTE — Assessment & Plan Note (Signed)
As of 08/24/2017  DME- [facility]  o2 2lpm with sleep and as needed daytime -  12/02/2018 ambulatory O2 walk test with no desaturations.   -  08/05/2019   Walked RA  approx   200 ft  @ slow pace  stopped due to  Sob/ no desats    -  02/19/2020   Walked RA  approx  243ft  @ slow pace  stopped due to  Sob/ sats still 94%    - ONO RA  03/09/2020 :   desats on RA < 89% x 2 h 39 min > 03/23/2020 rec 2lpm   - 11/03/2020 no desats walking 450 ft RA  - ONO 11/18/20  ONO RA deat < 89% x 1 h 29 min > rec 2lpm hs   Advised: Make sure you check your oxygen saturation  AT  your highest level of activity (not after you stop)   to be sure it stays over 90% and adjust  02 flow upward to maintain this level if needed but remember to turn it back to previous settings when you stop (to conserve your supply).

## 2021-11-02 NOTE — Progress Notes (Signed)
Subjective:     Patient ID: Jasmine [name], female   DOB: [birth year]    MRN: [id number]    Brief patient profile: 5  yowf   active smoker/MM  dx with asthma as child but by HS outgrew it until her 69's with flares but better between until early 2000s and maintain on multliple inhalers and then 02/2016 hosp in [place] then [facility] April 2019 and newly started on 02 = 2lpm as needed daytime and hs and referred to pulmonary clinic 08/24/2017 by Dr   Danice Goltz with GOLD II spirometry documented 09/21/17     02/19/2020  f/u ov/[place] office/Nazaret Chea re: GOLD II/ 02 dep still smoking Chief Complaint  Patient presents with   Follow-up    Here for o2 recert- c/o waking up in the night coughing and "gagging" x 3 months. Cough is occ prod with clear sputum. She is using her albuterol inhaler about once per day.   Dyspnea: foot drop limiting / around the house doe  Cough: nightly cough/ gag 4 h after hs  and continues until gets up and uses saba in am / min clear mucus  Sleeping: bed is flat with wedge  SABA use: as above/ no neb  02: 2lpm hs  On ppi with breakfast  Rec Protonix 40 mg Take 30- 60 min before your first and last meals of the day  GERD diet reviewed, bed blocks rec  We will be scheduling you ONO on room air to qualify you for 02  Please remember to go to the  x-ray department  pos SPN CTchest 03/19/20 : 1. 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 After surgery  and lately she reports a cough with clear mucus.  - MR   Dyspnea:  limited by R foot drop / using rollator  Cough: still coughing thick clear mucus esp in am / using delyms  Sleeping: bed is flat/ some am congestion  SABA use: too much  02: 4lpm 24/7  Rec For cough/ congestion > mucinex dm 1200 mg every 12 hours as needed and use flutter valve  The key is to stop smoking completely before smoking completely stops you! Plan A = Automatic = Always=  Symbicort 160 / spiriva 2 of each then 12 hours later 2 puffs of symbicort  Plan B = Backup (to supplement plan A, not to replace it) Only use your albuterol inhaler as a rescue medication  Plan C = Crisis (instead of Plan B but only if Plan B stops working) - only use your albuterol nebulizer if you first try Plan B and it fails to help  Ok to try albuterol 15 min before an activity (on alternating days)  that you know would usually make you short of breath  We will contact [facility] to do a  best fit evaluation  - late add did not need amb 02 based on today's eval   Make sure you check your oxygen saturation  at your highest level of activity  to be sure it stays over 90% and adjust  02 flow upward to maintain this level if needed but remember to turn it back to previous settings when you stop (to conserve your supply).   Please schedule a follow up visit in 3 months but call sooner if needed   Admit date: 09/26/2021   Discharge date: 09/29/2021   Admitted From:Home     Brief/Interim Summary: Jasmine [name] is a 67 y.o. female with medical history significant of allergies, anemia, COPD, smoker, GERD, hypertension, lung cancer, and more presents ED with a chief complaint of dyspnea.  Patient has been admitted for acute COPD exacerbation and remained on her usual 5 L nasal cannula oxygen, but required several days of IV Solu-Medrol and breathing treatments as well as brief use of Zithromax to help improve her symptoms.  Chest x-ray did not  demonstrate any acute findings.  She now feels as though she is back to baseline and is in stable condition for discharge and will resume breathing treatments as needed at home and will continue on prednisone as prescribed for several more days.  She was also seen by palliative care while admitted and will be set up with outpatient palliative services.   Discharge Diagnoses:  Principal Problem:   COPD exacerbation ([place])   HTN (hypertension)   Cigarette smoker   Chronic respiratory failure with hypoxia (H[facility])   GERD (gastroesophageal reflux disease)   Chronic pain syndrome   Chronic pancreatitis ([employer])   Hyperlipidemia  10/19/2021  f/u ov/ office/Jasmine [name] re: GOLD 2/ 02 dep  maint on wix/spiriva   Chief Complaint  Patient presents with   Follow-up    On 5LO2 cont 24/7  Feels breathing is worse since last ov.  On wixella inhaler needs refill on albuterol   Dyspnea: Rollator due R sided weakness  Cough: rattling /congested sounding cough min mucoid sputum Sleeping: recliner x 45 degrees x 3 months  SABA use: albuterol 8 x per day / neb twice daily not prechallenging  02: 5lpm 24/7  Rec Stop wexilla and spiriva and breztri Take 2 puffs first thing in am and then another 2 puffs about 12 hours later.  For cough >  mucinex dm 1200 mg every 12 hours and use the flutter valve as much as you can - do not take delsym Plan A = Automatic = Always=    breztri Take 2 puffs first thing in am and then another 2 puffs about 12 hours later.   Work on inhaler technique:  - remember how  golfers warm up - take practice breaths with your empty inhalers  Plan B = Backup (to supplement plan A, not to replace it) Only use your albuterol inhaler as a rescue medication Plan C = Crisis (instead of Plan B but only if Plan B stops working) - only use your albuterol nebulizer if you first try Plan B and it fails to help > ok to use the nebulizer up to every 4 hours but if start needing it regularly call for  immediate appointment Plan D = Deltasone  - if plan ABC not working > Prednisone 10 mg take  4 each am x 2 days,   2 each am x 2 days,  1 each am x 2 days and stop  ( this  is a refillable prescription) The key is to stop smoking completely before smoking completely stops you! Please schedule a follow up office visit in 2 weeks, sooner if needed  with all medications /inhalers/ solutions in hand   11/02/2021  f/u ov/[place] office/Jasmine [name] re: GOLD 2/ 02 dep  maint on breztri/ saba and prn pred x 6 days  / still smoking Chief Complaint  Patient presents with   Follow-up    Patient thinks she has improved a little.    Dyspnea:  rollator around the house  Cough: rattling / clear / thick mucus esp in am  Sleeping: same recliner/ angle  SABA use: hfa 2-3 x per day/ neb twice / only hfa one today  02: 4-5lpm  Covid status: vax x 3      No obvious day to day or daytime variability or assoc  mucus plugs or hemoptysis or cp or chest tightness, subjective wheeze or overt sinus or hb symptoms.   Sleeping  without nocturnal  or early am exacerbation  of respiratory  c/o's or need for noct saba. Also denies any obvious fluctuation of symptoms with weather or environmental changes or other aggravating or alleviating factors except as outlined above   No unusual exposure hx or h/o childhood pna/ asthma or knowledge of premature birth.  Current Allergies, Complete Past Medical History, Past Surgical History, Family History, and Social History were reviewed in [employer] record.  ROS  The following are not active complaints unless bolded Hoarseness, sore throat, dysphagia, dental problems, itching, sneezing,  nasal congestion or discharge of excess mucus or purulent secretions, ear ache,   fever, chills, sweats, unintended wt loss or wt gain, classically pleuritic or exertional cp,  orthopnea pnd or arm/hand swelling  or leg swelling, presyncope, palpitations, abdominal pain,  anorexia, nausea, vomiting, diarrhea  or change in bowel habits or change in bladder habits, change in stools or change in urine, dysuria, hematuria,  rash, arthralgias, visual complaints, headache, numbness, weakness or ataxia or problems with walking or coordination,  change in mood or  memory.        Current Meds  Medication Sig   albuterol (VENTOLIN HFA) 108 (90 Base) MCG/ACT inhaler Inhale 2 puffs into the lungs every 6 (six) hours as needed for wheezing or shortness of breath.   amLODipine (NORVASC) 10 MG tablet Take 1 tablet (10 mg total) by mouth daily.   atorvastatin (LIPITOR) 40 MG tablet Take 1 tablet (40 mg total) by mouth daily.   bisoprolol (ZEBETA) 5 MG tablet Take 0.5 tablets (2.5 mg total) by mouth daily. Take in Place of Metoprolol   Budeson-Glycopyrrol-Formoterol (BREZTRI AEROSPHERE) 160-9-4.8 MCG/ACT AERO Inhale 2 puffs into the lungs 2 (two) times daily.   Budeson-Glycopyrrol-Formoterol (BREZTRI AEROSPHERE) 160-9-4.8 MCG/ACT AERO Inhale 2 puffs into the lungs in the morning and at bedtime.   dextromethorphan (DELSYM) 30 MG/5ML liquid Take 15 mg by mouth as needed for cough.   diclofenac Sodium (VOLTAREN) 1 % GEL Apply 2 g topically 4 (four) times daily.   lipase/protease/amylase 24000-76000 units CPEP Take 2 caps daily with meals and 1 cap with snacks (Patient taking differently: Take 1-2 capsules by mouth See admin instructions. Take 2 capsules by mouth daily with meals; and take 1 capsule by mouth with snacks.)   magic mouthwash SOLN Take 5 mLs by mouth 3 (three) times daily as needed for mouth pain.   mirabegron ER (MYRBETRIQ) 25 MG TB24 tablet Take 1 tablet (25 mg total) by mouth  daily.   mirtazapine (REMERON) 15 MG tablet Take 1 tablet (15 mg total) by mouth at bedtime.   Nutritional Supplements (ENSURE HIGH PROTEIN) LIQD Drink 1 can 3 times daily.   omeprazole (PRILOSEC) 40 MG capsule Take 1 capsule (40 mg total) by mouth 2 (two) times daily with a meal.    oxyCODONE-acetaminophen (PERCOCET) 10-325 MG tablet Take 1 tablet by mouth 5 (five) times daily.   OXYCONTIN 15 MG 12 hr tablet Take 15 mg by mouth every 12 (twelve) hours.   OXYGEN Inhale 4 L into the lungs See admin instructions. continuous   prochlorperazine (COMPAZINE) 25 MG suppository Place 1 suppository (25 mg total) rectally every 12 (twelve) hours as needed for nausea or vomiting.   tiZANidine (ZANAFLEX) 4 MG capsule Take 1 capsule (4 mg total) by mouth 3 (three) times daily as needed for muscle spasms.   traZODone (DESYREL) 150 MG tablet TAKE 1 TO 2 TABLETS AT BEDTIME (Patient taking differently: Take 225 mg by mouth at bedtime.)                              Objective:   Physical Exam   Wts  11/02/2021           109  10/19/2021           108  11/03/2020         92 02/19/2020       96  08/05/2019           98.6  01/09/2018        105  09/21/2017        104   08/24/17 105 lb 9.6 oz (47.9 kg)  07/31/17 106 lb (48.1 kg)  07/26/17 105 lb 3.2 oz (47.7 kg)    Vital signs reviewed  11/02/2021  - Note at rest 02 sats  97% on 6lpm    General appearance:    amb with rollator elderly wf >> stated age    HEENT :  Oropharynx  clear   Nasal turbinates nl    NECK :  without JVD/Nodes/TM/ nl carotid upstrokes bilaterally   LUNGS: no acc muscle use,  Mod barrel  contour chest wall with bilateral  Distant bs s audible wheeze and  without cough on insp or exp maneuvers and mod  Hyperresonant  to  percussion bilaterally     CV:  RRR  no s3 or murmur or increase in P2, and no edema   ABD:  soft and nontender with pos mid insp Hoover's  in the supine position. No bruits or organomegaly appreciated, bowel sounds nl  MS:   Ext warm without deformities or   obvious joint restrictions , calf tenderness, cyanosis or clubbing  SKIN: warm and dry without lesions    NEURO:  alert, approp, nl sensorium with  no motor or cerebellar deficits apparent.           I personally reviewed  images and agree with radiology impression as follows:   Chest CTa  09/26/21  No evidence of pulmonary embolism. Improving bibasilar opacities, favoring multifocal pneumonia. Status post right upper lobectomy. Progressive irregular right lower lobe nodule, now measuring 2.2 cm, indeterminate but concerning for primary bronchogenic neoplasm.              Assessment:

## 2021-11-02 NOTE — Assessment & Plan Note (Signed)
Counseled re importance of smoking cessation but did not meet time criteria for separate billing           Each maintenance medication was reviewed in detail including emphasizing most importantly the difference between maintenance and prns and under what circumstances the prns are to be triggered using an action plan format where appropriate.  Total time for H and P, chart review, counseling, reviewing hfa/neb/02 device(s) and generating customized AVS unique to this office visit / same day charting > 30 min for multiple  refractory respiratory  symptoms

## 2021-11-02 NOTE — Assessment & Plan Note (Signed)
Active smoker - 1st detected on cxr 02/24/20 - CTchest 03/19/20 : 1. 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

## 2021-11-02 NOTE — Patient Instructions (Addendum)
For cough > mucinex 1200 mg every 12 hours and use the flutter valve as much as possible   Work on inhaler technique:  relax and gently blow all the way out then take a nice smooth full deep breath back in, triggering the inhaler at same time you start breathing in.  Hold breath in for at least  5 seconds if you can. Blow out breztri thru nose. Rinse and gargle with water when done.  If mouth or throat bother you at all,  try brushing teeth/gums/tongue with arm and hammer toothpaste/ make a slurry and gargle and spit out.     The key is to stop smoking completely before smoking completely stops you!   Please schedule a follow up office visit in 6 weeks, call sooner if needed

## 2021-11-03 ENCOUNTER — Ambulatory Visit (HOSPITAL_COMMUNITY): Admission: RE | Admit: 2021-11-03 | Payer: Medicare Other | Source: Ambulatory Visit

## 2021-11-14 ENCOUNTER — Ambulatory Visit (INDEPENDENT_AMBULATORY_CARE_PROVIDER_SITE_OTHER): Payer: Medicare Other | Admitting: Nurse Practitioner

## 2021-11-14 ENCOUNTER — Encounter: Payer: Self-pay | Admitting: Nurse Practitioner

## 2021-11-14 ENCOUNTER — Ambulatory Visit (INDEPENDENT_AMBULATORY_CARE_PROVIDER_SITE_OTHER): Payer: Medicare Other

## 2021-11-14 VITALS — BP 146/77 | HR 70 | Temp 98.6°F | Ht 63.0 in | Wt 108.0 lb

## 2021-11-14 DIAGNOSIS — M79671 Pain in right foot: Secondary | ICD-10-CM

## 2021-11-14 NOTE — Progress Notes (Signed)
   Acute Office Visit  Subjective:     Patient ID: Jasmine [name], female    DOB: 05-Aug-1955, 66 y.o.   MRN: [id number]  Chief Complaint  Patient presents with   Foot Injury    Right foot pain been going on for 2 weeks    Foot Injury  The incident occurred 6 to 12 hours ago. The incident occurred at home. The injury mechanism was a twisting injury. The pain is present in the right foot. The quality of the pain is described as aching. The pain is at a severity of 5/10. The pain is moderate. She reports no foreign bodies present. She has tried acetaminophen for the symptoms.     Review of Systems  Constitutional: Negative.  Negative for fever.  HENT: Negative.    Eyes: Negative.   Cardiovascular: Negative.   Genitourinary: Negative.   Musculoskeletal:  Positive for joint pain.  Skin:  Negative for itching and rash.  All other systems reviewed and are negative.       Objective:    BP (!) 146/77   Pulse 70   Temp 98.6 F (37 C)   Ht 5\' 3"  (1.6 m)   Wt 108 lb (49 kg)   SpO2 97%   BMI 19.13 kg/m  BP Readings from Last 3 Encounters:  11/14/21 (!) 146/77  11/02/21 136/80  10/26/21 115/63      Physical Exam Vitals and nursing note reviewed.  Constitutional:      Appearance: She is ill-appearing.  HENT:     Head: Normocephalic.     Right Ear: External ear normal.     Left Ear: External ear normal.     Nose: Nose normal.  Cardiovascular:     Rate and Rhythm: Normal rate and regular rhythm.     Pulses: Normal pulses.     Heart sounds: Normal heart sounds.  Pulmonary:     Effort: Pulmonary effort is normal.     Breath sounds: Normal breath sounds.  Abdominal:     General: Bowel sounds are normal.  Musculoskeletal:     Right foot: Foot drop present. No deformity.     Left foot: Foot drop present. No deformity.       Feet:  Feet:     Right foot:     Skin integrity: Warmth present.     Comments: Right foot swelling and tenderness. Skin:    General: Skin is  dry.     Findings: No erythema or rash.  Neurological:     Mental Status: She is alert and oriented to person, place, and time.     No results found for any visits on 11/14/21.      Assessment & Plan:  For pain symptoms not well controlled in the past 2 to 3 days.  Patient twisted her foot while walking.  Currently on oxycodone, OxyContin for pain.  Advised patient to take all meds as prescribed, elevate foot and apply ice as needed.  Completed right foot x-ray results pending.  Follow-up with worsening or unresolved symptoms. Problem List Items Addressed This Visit   None Visit Diagnoses     Foot pain, right    -  Primary   Relevant Orders   DG Foot Complete Right       No orders of the defined types were placed in this encounter.   Return if symptoms worsen or fail to improve.  Ivy Lynn, NP

## 2021-11-14 NOTE — Patient Instructions (Signed)
Ankle Pain The ankle joint holds your body weight and allows you to move around. Ankle pain can occur on either side or the back of one ankle or both ankles. Ankle pain may be sharp and burning or dull and aching. There may be tenderness, stiffness, redness, or warmth around the ankle. Many things can cause ankle pain, including an injury to the area and overuse of the ankle. Follow these instructions at home: Activity Rest your ankle as told by your health care provider. Avoid any activities that cause ankle pain. Do not use the injured limb to support your body weight until your health care provider says that you can. Use crutches as told by your health care provider. Do exercises as told by your health care provider. Ask your health care provider when it is safe to drive if you have a brace on your ankle. If you have a brace: Wear the brace as told by your health care provider. Remove it only as told by your health care provider. Loosen the brace if your toes tingle, become numb, or turn cold and blue. Keep the brace clean. If the brace is not waterproof: Do not let it get wet. Cover it with a watertight covering when you take a bath or shower. If you were given an elastic bandage:  Remove it when you take a bath or a shower. Try not to move your ankle very much, but wiggle your toes from time to time. This helps to prevent swelling. Adjust the bandage to make it more comfortable if it feels too tight. Loosen the bandage if you have numbness or tingling in your foot or if your foot turns cold and blue. Managing pain, stiffness, and swelling  If directed, put ice on the painful area. If you have a removable brace or elastic bandage, remove it as told by your health care provider. Put ice in a plastic bag. Place a towel between your skin and the bag. Leave the ice on for 20 minutes, 2-3 times a day. Move your toes often to avoid stiffness and to lessen swelling. Raise (elevate) your  ankle above the level of your heart while you are sitting or lying down. General instructions Record information about your pain. Writing down the following may be helpful for you and your health care provider: How often you have ankle pain. Where the pain is located. What the pain feels like. If treatment involves wearing a prescribed shoe or insole, make sure you wear it correctly and for as long as told by your health care provider. Take over-the-counter and prescription medicines only as told by your health care provider. Keep all follow-up visits as told by your health care provider. This is important. Contact a health care provider if: Your pain gets worse. Your pain is not relieved with medicines. You have a fever or chills. You are having more trouble with walking. You have new symptoms. Get help right away if: Your foot, leg, toes, or ankle: Tingles or becomes numb. Becomes swollen. Turns pale or blue. Summary Ankle pain can occur on either side or the back of one ankle or both ankles. Ankle pain may be sharp and burning or dull and aching. Rest your ankle as told by your health care provider. If told, apply ice to the area. Take over-the-counter and prescription medicines only as told by your health care provider. This information is not intended to replace advice given to you by your health care provider. Make sure you discuss   any questions you have with your health care provider. Document Revised: 04/15/2020 Document Reviewed: 04/15/2020 Elsevier Patient Education  2023 [employer]vier Inc.  

## 2021-11-30 ENCOUNTER — Encounter (HOSPITAL_COMMUNITY): Payer: Self-pay | Admitting: Radiology

## 2021-11-30 ENCOUNTER — Ambulatory Visit (HOSPITAL_COMMUNITY)
Admission: RE | Admit: 2021-11-30 | Discharge: 2021-11-30 | Disposition: A | Discharge status: Home / Self Care | Payer: Medicare Other | Source: Ambulatory Visit | Attending: Gastroenterology | Admitting: Gastroenterology

## 2021-11-30 DIAGNOSIS — K838 Other specified diseases of biliary tract: Secondary | ICD-10-CM | POA: Insufficient documentation

## 2021-11-30 DIAGNOSIS — K861 Other chronic pancreatitis: Secondary | ICD-10-CM | POA: Diagnosis present

## 2021-11-30 MED ORDER — IOHEXOL 300 MG/ML  SOLN
100.0000 mL | Freq: Once | INTRAMUSCULAR | Status: AC | PRN
  Administered 2021-11-30: 100 mL via INTRAVENOUS

## 2021-12-05 ENCOUNTER — Telehealth: Payer: Self-pay

## 2021-12-05 NOTE — Telephone Encounter (Signed)
10/31 appt at 930 am with GM scheduled and pt/husband aware.  All questions answered to the best of my ability

## 2021-12-05 NOTE — Telephone Encounter (Signed)
Sharyn Creamer, MD  Mansouraty, Telford Nab., MD; Timothy Lasso, RN; Sherron Monday, NP Thanks Chester Holstein. Laquinda Moller, once you are able get the patient scheduled, please cancel my appt with her. Thanks.

## 2021-12-05 NOTE — Telephone Encounter (Signed)
-----   Message from Irving Copas., MD sent at 12/03/2021  6:09 AM EDT ----- Thanks for forwarding this information to me. I think what happened with this patient is that because she was scheduled with Dr. Ardis Hughs and now that he is away she ended up getting rescheduled with next available. Yes, it makes sense for me to talk with her rather than Dr. Lorenso Courier since she does not perform EUS.  Jasmine [name], This patient needs to be scheduled with me for a clinic visit to discuss consideration of an endoscopic ultrasound in the setting of her chronic pancreatitis, her PD dilation, CBD dilation. She can be scheduled with me as an overbook slot during the day or with a 3:50 PM slot. Thanks. GM ----- Message ----- From: Sharyn Creamer, MD Sent: 12/02/2021   9:34 PM EDT To: Irving Copas., MD; #  Mikey Bussing, thank you for this information. I see a telephone note that Dr. Rush Landmark left previously as well. I think that this patient would potentially be better served by seeing Dr. Rush Landmark in clinic to discuss the risks and benefits of the EUS procedure. I do not perform the EUS procedure myself.  Gabe, let me know if you agree. Thanks. ----- Message ----- From: Sherron Monday, NP Sent: 12/02/2021   8:38 AM EDT To: Sharyn Creamer, MD  Hi Dr. Lorenso Courier, You will be seeing this patient in November to discuss performing EUS. She has had chronic dilation of her common bile duct as well as dilation of her pancreatic duct in the setting of chronic pancreatitis based on prior imaging. 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 In initial conversations with Dr. Rush Landmark and Dr.  Ardis Hughs they requested an updated CT scan prior to seeing her for evaluation given her high risk nature. I am forwarding you these results for your review prior to seeing the patient.   Venetia Night, NP

## 2021-12-06 ENCOUNTER — Ambulatory Visit: Payer: Medicare Other | Admitting: Gastroenterology

## 2021-12-14 ENCOUNTER — Ambulatory Visit: Payer: Medicare Other | Admitting: Internal Medicine

## 2021-12-14 NOTE — Progress Notes (Deleted)
Subjective:     Patient ID: Jasmine [name], female   DOB: May 28, 1955    MRN: [id number]    Brief patient profile: 78  yowf   active smoker/MM  dx with asthma as child but by HS outgrew it until her 71's with flares but better between until early 2000s and maintain on multliple inhalers and then 02/2016 hosp in [place] then [facility] April 2019 and newly started on 02 = 2lpm as needed daytime and hs and referred to pulmonary clinic 08/24/2017 by Dr   Danice Goltz with GOLD II spirometry documented 09/21/17     02/19/2020  f/u ov/[place] office/Tyyne Cliett re: GOLD II/ 02 dep still smoking Chief Complaint  Patient presents with   Follow-up    Here for o2 recert- c/o waking up in the night coughing and "gagging" x 3 months. Cough is occ prod with clear sputum. She is using her albuterol inhaler about once per day.   Dyspnea: foot drop limiting / around the house doe  Cough: nightly cough/ gag 4 h after hs  and continues until gets up and uses saba in am / min clear mucus  Sleeping: bed is flat with wedge  SABA use: as above/ no neb  02: 2lpm hs  On ppi with breakfast  Rec Protonix 40 mg Take 30- 60 min before your first and last meals of the day  GERD diet reviewed, bed blocks rec  We will be scheduling you ONO on room air to qualify you for 02  Please remember to go to the  x-ray department  pos SPN CTchest 03/19/20 : 1. 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 After surgery  and lately she reports a cough with clear mucus.  - MR   Dyspnea:  limited by R foot drop / using rollator  Cough: still coughing thick clear mucus esp in am / using delyms  Sleeping: bed is flat/ some am congestion  SABA use: too much  02: 4lpm 24/7  Rec For cough/ congestion > mucinex dm 1200 mg every 12 hours as needed and use flutter valve  The key is to stop smoking completely before smoking completely stops you! Plan A = Automatic = Always=  Symbicort 160 / spiriva 2 of each then 12 hours later 2 puffs of symbicort  Plan B = Backup (to supplement plan A, not to replace it) Only use your albuterol inhaler as a rescue medication  Plan C = Crisis (instead of Plan B but only if Plan B stops working) - only use your albuterol nebulizer if you first try Plan B and it fails to help  Ok to try albuterol 15 min before an activity (on alternating days)  that you know would usually make you short of breath  We will contact [facility] to do a  best fit evaluation  - late add did not need amb 02 based on today's eval   Make sure you check your oxygen saturation  at your highest level of activity  to be sure it stays over 90% and adjust  02 flow upward to maintain this level if needed but remember to turn it back to previous settings when you stop (to conserve your supply).   Please schedule a follow up visit in 3 months but call sooner if needed   Admit date: 09/26/2021   Discharge date: 09/29/2021   Admitted From:Home     Brief/Interim Summary: Jasmine [name] is a 66 y.o. female with medical history significant of allergies, anemia, COPD, smoker, GERD, hypertension, lung cancer, and more presents ED with a chief complaint of dyspnea.  Patient has been admitted for acute COPD exacerbation and remained on her usual 5 L nasal cannula oxygen, but required several days of IV Solu-Medrol and breathing treatments as well as brief use of Zithromax to help improve her symptoms.  Chest x-ray did not  demonstrate any acute findings.  She now feels as though she is back to baseline and is in stable condition for discharge and will resume breathing treatments as needed at home and will continue on prednisone as prescribed for several more days.  She was also seen by palliative care while admitted and will be set up with outpatient palliative services.   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  On wixella inhaler needs refill on albuterol   Dyspnea: Rollator due R sided weakness  Cough: rattling /congested sounding cough min mucoid sputum Sleeping: recliner x 45 degrees x 3 months  SABA use: albuterol 8 x per day / neb twice daily not prechallenging  02: 5lpm 24/7  Rec Stop wexilla and spiriva and breztri Take 2 puffs first thing in am and then another 2 puffs about 12 hours later.  For cough >  mucinex dm 1200 mg every 12 hours and use the flutter valve as much as you can - do not take delsym Plan A = Automatic = Always=    breztri Take 2 puffs first thing in am and then another 2 puffs about 12 hours later.   Work on inhaler technique:  - remember how  golfers warm up - take practice breaths with your empty inhalers  Plan B = Backup (to supplement plan A, not to replace it) Only use your albuterol inhaler as a rescue medication Plan C = Crisis (instead of Plan B but only if Plan B stops working) - only use your albuterol nebulizer if you first try Plan B and it fails to help > ok to use the nebulizer up to every 4 hours but if start needing it regularly call for  immediate appointment Plan D = Deltasone  - if plan ABC not working > Prednisone 10 mg take  4 each am x 2 days,   2 each am x 2 days,  1 each am x 2 days and stop  ( this  is a refillable prescription) The key is to stop smoking completely before smoking completely stops you! Please schedule a follow up office visit in 2 weeks, sooner if needed  with all medications /inhalers/ solutions in hand   11/02/2021  f/u ov/[place] office/Simara Rhyner re: GOLD 2/ 02 dep  maint on breztri/ saba and prn pred x 6 days  / still smoking Chief Complaint  Patient presents with   Follow-up    Patient thinks she has improved a little.    Dyspnea:  rollator around the house  Cough: rattling / clear / thick mucus esp in am  Sleeping: same recliner/ angle  SABA use: hfa 2-3 x per day/ neb twice / only hfa one today  02: 4-5lpm  Covid status: vax x 3  Rec For cough > mucinex 1200 mg every 12 hours and use the flutter valve as much as possible  Work on inhaler technique:  The key is to stop smoking completely before smoking completely stops you! Please schedule a follow up office visit in 6 weeks, call sooner if needed    12/14/2021  f/u ov/[place] office/Alesi Zachery re: *** maint on ***  f/u new RLL nodule  No chief complaint on file.   Dyspnea:  *** Cough: *** Sleeping: *** SABA use: *** 02: *** Covid status: *** Lung cancer screening: ***   No obvious day to day or daytime variability or assoc excess/ purulent sputum or mucus plugs or hemoptysis or cp or chest tightness, subjective wheeze or overt sinus or hb symptoms.   *** without nocturnal  or early am exacerbation  of respiratory  c/o's or need for noct saba. Also denies any obvious fluctuation of symptoms with weather or environmental changes or other aggravating or alleviating factors except as outlined above   No unusual exposure hx or h/o childhood pna/ asthma or knowledge of premature birth.  Current Allergies, Complete Past Medical History,  Past Surgical History, Family History, and Social History were reviewed in [employer] record.  ROS  The following are not active complaints unless bolded Hoarseness, sore throat, dysphagia, dental problems, itching, sneezing,  nasal congestion or discharge of excess mucus or purulent secretions, ear ache,   fever, chills, sweats, unintended wt loss or wt gain, classically pleuritic or exertional cp,  orthopnea pnd or arm/hand swelling  or leg swelling, presyncope, palpitations, abdominal pain, anorexia, nausea, vomiting, diarrhea  or change in bowel habits or change in bladder habits, change in stools or change in urine, dysuria, hematuria,  rash, arthralgias, visual complaints, headache, numbness, weakness or ataxia or problems with walking or coordination,  change in mood or  memory.        No outpatient medications have been marked as taking for the 12/14/21 encounter (Appointment) with Tanda Rockers, MD.                             Objective:   Physical Exam   Wts  12/14/2021          ***  11/02/2021           109  10/19/2021           108  11/03/2020         92 02/19/2020       96  08/05/2019           98.6  01/09/2018        105  09/21/2017  104   08/24/17 105 lb 9.6 oz (47.9 kg)  07/31/17 106 lb (48.1 kg)  07/26/17 105 lb 3.2 oz (47.7 kg)    Vital signs reviewed  12/14/2021  - Note at rest 02 sats  ***% on ***   General appearance:    ***      Mod bar***                    Assessment:

## 2021-12-30 ENCOUNTER — Encounter: Payer: Self-pay | Admitting: Internal Medicine

## 2021-12-30 DIAGNOSIS — J9611 Chronic respiratory failure with hypoxia: Secondary | ICD-10-CM

## 2021-12-30 NOTE — Telephone Encounter (Signed)
Fine with me

## 2021-12-30 NOTE — Telephone Encounter (Signed)
"  I currently have a 5 mil capacity oxygen concentrator,  and home fill machine in use from [facility]. With my oxygen level set on 6 liters, it makes it difficult to fill the small portable tanks needed for travel outside the home. Would you be able to prescribe a 10 mil concentrator,  which also comes with 2 additional portable tanks which can be filled quickly because of the increased capacity. [facility] has them available. Thank you for your consideration. "  Dr. Melvyn Novas, please advise, okay to send order? Order is pended.

## 2022-01-03 ENCOUNTER — Ambulatory Visit (INDEPENDENT_AMBULATORY_CARE_PROVIDER_SITE_OTHER): Payer: Medicare Other | Admitting: Gastroenterology

## 2022-01-03 ENCOUNTER — Encounter: Payer: Self-pay | Admitting: Gastroenterology

## 2022-01-03 VITALS — BP 142/82 | HR 110 | Ht 63.0 in | Wt 114.0 lb

## 2022-01-03 DIAGNOSIS — K8681 Exocrine pancreatic insufficiency: Secondary | ICD-10-CM

## 2022-01-03 DIAGNOSIS — R935 Abnormal findings on diagnostic imaging of other abdominal regions, including retroperitoneum: Secondary | ICD-10-CM

## 2022-01-03 DIAGNOSIS — K861 Other chronic pancreatitis: Secondary | ICD-10-CM

## 2022-01-03 DIAGNOSIS — K838 Other specified diseases of biliary tract: Secondary | ICD-10-CM | POA: Diagnosis not present

## 2022-01-03 DIAGNOSIS — K8689 Other specified diseases of pancreas: Secondary | ICD-10-CM | POA: Diagnosis not present

## 2022-01-03 MED ORDER — PANCRELIPASE (LIP-PROT-AMYL) 24000-76000 UNITS PO CPEP: ORAL_CAPSULE | ORAL | 2 refills | Status: DC

## 2022-01-03 NOTE — Patient Instructions (Addendum)
Your provider has requested that you go to the basement level for lab work before leaving today. Press "B" on the elevator. The lab is located at the first door on the left as you exit the elevator.  We have sent the following medications to your pharmacy for you to pick up at your convenience: Creon   You will be contacted to schedule your CT Scan and office follow up appointment in 2 months.   _______________________________________________________  If you are age 52 or older, your body mass index should be between 23-30. Your Body mass index is 20.19 kg/m. If this is out of the aforementioned range listed, please consider follow up with your Primary Care Provider.  If you are age 85 or younger, your body mass index should be between 19-25. Your Body mass index is 20.19 kg/m. If this is out of the aformentioned range listed, please consider follow up with your Primary Care Provider.   ________________________________________________________  The [facility] providers would like to encourage you to use [facility] to communicate with providers for non-urgent requests or questions.  Due to long hold times on the telephone, sending your provider a message by [facility] may be a faster and more efficient way to get a response.  Please allow 48 business hours for a response.  Please remember that this is for non-urgent requests.  _______________________________________________________  Thank you for choosing me and [facility].  Dr. Rush Landmark

## 2022-01-04 ENCOUNTER — Encounter: Payer: Self-pay | Admitting: Gastroenterology

## 2022-01-04 NOTE — Progress Notes (Signed)
[place] VISIT   Primary Care Provider Ronnie Doss [place], DO [place] [address] [phone]  Referring Provider [facility]  Patient Profile: Jasmine [name] is a 66 y.o. female with a pmh significant for COPD (on 6 to 8 L O2), lung cancer (status post partial lobectomy), chronic back pain (on chronic opioids), hyperlipidemia, hypertension, nephrolithiasis, colon polyps (TA's), GERD, idiopathic pancreatitis, abnormal pancreas imaging with dilated duct, chronically dilated bile duct, low fecal elastase concerning for EPI.  The patient presents to the [facility] for an evaluation and management of problem(s) noted below:  Problem List 1. Chronic pancreatitis, unspecified pancreatitis type ([place])   2. Exocrine pancreatic insufficiency   3. Common bile duct dilation   4. Dilation of pancreatic duct   5. Abnormal CT of the abdomen     History of Present Illness This is the patient's first visit to the outpatient [place] clinic and she is accompanied by her husband.  The patient was previously referred to Dr. Ardis Hughs but due to him being on leave was rescheduled to see me.  Patient has had multiple episodes of reported pancreatitis that were found to be idiopathic in nature.  She has had abnormal imaging for a few years that had shown some pancreatic duct dilation with concern for chronic pancreatitis.  She has been on chronic opioid therapy for her chronic back pain for years.  She has undergone endoscopic evaluation by the [place] team 2020 to go at that time she was only on 3 to 4 L of oxygen at that time.  More recently after a recent hospitalization she had a fecal elastase checked and was found to have a low number.  She was initiated on Creon and felt some improvement with her symptoms.  Unfortunately due to the cost it was felt that she could not continue this.  She found out that a 90-day supply may be more  financially feasible for her and she is wondering if that could be helpful for her as well.  Patient has a history of previous alcohol consumption but not to an extent that would typically be thought to increase her risk for chronic pancreatitis.  She describes her bowels as being regular without having to loose of a bowel movement on a regular basis.  At times she will have diarrhea but not frequently.  She has a prior history of C. difficile years ago.  In the setting of her current status she has no nausea or vomiting currently.  She just occasionally will have episodes of nausea and midepigastric right upper quadrant pain.  If they are severe she is going to the hospital otherwise she tries to go to a bland diet and things stabilize.  She has had some decreased appetite for a while.  Unfortunately her oxygen requirements have increased over the course of the last year and now she is on 6 to 8 L daily and has significant dyspnea with certain activities of daily living.  She cannot get an MRI due to having a spinal stimulator in place.  GI Review of Systems Positive as above Negative for dysphagia, odynophagia, melena, hematochezia  Review of Systems General: Denies fevers/chills/weight loss unintentionally Cardiovascular: Denies chest pain Pulmonary: Denies shortness of breath Gastroenterological: See HPI Genitourinary: Denies darkened urine  Hematological: Denies easy bruising/bleeding Dermatological: Denies jaundice Psychological: Mood is stable   Medications Current Outpatient Medications  Medication Sig Dispense Refill   albuterol (VENTOLIN HFA) 108 (90  Base) MCG/ACT inhaler 2puffs up to every 4 hours if can't catch your breath 18 g 11   amLODipine (NORVASC) 10 MG tablet Take 1 tablet (10 mg total) by mouth daily. 90 tablet 3   atorvastatin (LIPITOR) 40 MG tablet Take 1 tablet (40 mg total) by mouth daily. 90 tablet 3   bisoprolol (ZEBETA) 5 MG tablet Take 0.5 tablets (2.5 mg total) by  mouth daily. Take in Place of Metoprolol 45 tablet 2   Budeson-Glycopyrrol-Formoterol (BREZTRI AEROSPHERE) 160-9-4.8 MCG/ACT AERO Inhale 2 puffs into the lungs in the morning and at bedtime. 10.7 g 11   dextromethorphan (DELSYM) 30 MG/5ML liquid Take 15 mg by mouth as needed for cough.     diclofenac Sodium (VOLTAREN) 1 % GEL Apply 2 g topically 4 (four) times daily.     magic mouthwash SOLN Take 5 mLs by mouth 3 (three) times daily as needed for mouth pain. 480 mL PRN   mirabegron ER (MYRBETRIQ) 25 MG TB24 tablet Take 1 tablet (25 mg total) by mouth daily. 30 tablet 0   mirtazapine (REMERON) 15 MG tablet Take 1 tablet (15 mg total) by mouth at bedtime. 90 tablet 3   Nutritional Supplements (ENSURE HIGH PROTEIN) LIQD Drink 1 can 3 times daily. 21330 mL PRN   omeprazole (PRILOSEC) 40 MG capsule Take 1 capsule (40 mg total) by mouth 2 (two) times daily with a meal. 180 capsule 3   oxyCODONE-acetaminophen (PERCOCET) 10-325 MG tablet Take 1 tablet by mouth 5 (five) times daily.     OXYCONTIN 15 MG 12 hr tablet Take 15 mg by mouth every 12 (twelve) hours.     OXYGEN Inhale 4 L into the lungs See admin instructions. continuous     predniSONE (DELTASONE) 10 MG tablet Take by mouth.     prochlorperazine (COMPAZINE) 25 MG suppository Place 1 suppository (25 mg total) rectally every 12 (twelve) hours as needed for nausea or vomiting. 12 suppository 3   tiZANidine (ZANAFLEX) 4 MG capsule Take 1 capsule (4 mg total) by mouth 3 (three) times daily as needed for muscle spasms. 30 capsule 1   traZODone (DESYREL) 150 MG tablet TAKE 1 TO 2 TABLETS AT BEDTIME (Patient taking differently: Take 225 mg by mouth at bedtime.) 180 tablet 3   Pancrelipase, Lip-Prot-Amyl, 24000-76000 units CPEP Take 2 caps daily with meals and 1 cap with snacks 270 capsule 2   No current facility-administered medications for this visit.    Allergies Allergies  Allergen Reactions   Lyrica [Pregabalin] Other (See Comments)    EXTREME  SHAKING/TREMBLING   Darvon [Propoxyphene]     UNSPECIFIED REACTION    Gabapentin     Extreme shaking and trembling    Augmentin [Amoxicillin-Pot Clavulanate] Nausea And Vomiting    Has patient had a PCN reaction causing immediate rash, facial/tongue/throat swelling, SOB or lightheadedness with hypotension:no Has patient had a PCN reaction causing severe rash involving mucus membranes or skin necrosis:No Has patient had a PCN reaction that required hospitalization:No Has patient had a PCN reaction occurring within the last 10 years:Yes--ONLY N/V If all of the above answers are "NO", then may proceed with Cephalosporin use.    Erythromycin Itching and Rash        Vibramycin [Doxycycline Calcium] Itching and Rash    "[place] "    Histories Past Medical History:  Diagnosis Date   Allergy    Anemia    Anxiety    Arthritis    Asthma    Carpal tunnel  syndrome    Chronic back pain    Cigarette smoker 9/62/8366   Complication of anesthesia    in the past has had N/V, the last few surgeries she has not been   COPD (chronic obstructive pulmonary disease) ([place])    Easy bruising 07/10/2016   GERD (gastroesophageal reflux disease)    Headache    Heart murmur 2005   never had any problems   History of blood transfusion 2018   History of kidney stones    Hypertension    Hypoglycemia    Hypoglycemia    Iron deficiency anemia 07/11/2016   Neuropathy    both arms   Normocytic anemia 07/29/2015   Pancreatitis    Pneumonia    PONV (postoperative nausea and vomiting)    Postoperative anemia due to acute blood loss 11/15/2015   R lung cancer    Sciatica    Seizures ([place])    as a child when she would have an asthma attack. none as an adult   Shortness of breath dyspnea    Past Surgical History:  Procedure Laterality Date   ABDOMINAL HYSTERECTOMY  2947   APPLICATION OF ROBOTIC ASSISTANCE FOR SPINAL PROCEDURE N/A 09/05/2016   Procedure: APPLICATION OF ROBOTIC ASSISTANCE FOR SPINAL  PROCEDURE;  Surgeon: Consuella Lose, MD;  Location: [facility];  Service: Neurosurgery;  Laterality: N/A;   BACK SURGERY     BIOPSY  06/14/2020   Procedure: BIOPSY;  Surgeon: Daneil Dolin, MD;  [facility];  Service: Endoscopy;;   [place]   COLONOSCOPY     unsure last   COLONOSCOPY WITH PROPOFOL N/A 06/14/2020   Procedure: COLONOSCOPY WITH PROPOFOL;  Surgeon: Daneil Dolin, MD;  [facility];  Service: Endoscopy;  Laterality: N/A;  PM   cyst removal face     disk repair     neck and lumbar region   ESOPHAGOGASTRODUODENOSCOPY (EGD) WITH PROPOFOL N/A 06/14/2020   Procedure: ESOPHAGOGASTRODUODENOSCOPY (EGD) WITH PROPOFOL;  Surgeon: Daneil Dolin, MD;  [facility];  Service: Endoscopy;  Laterality: N/A;   FOOT SURGERY Bilateral    to file bones down    HARDWARE REMOVAL N/A 09/05/2016   Procedure: HARDWARE REMOVAL AND REPLACEMENT OF LUMBAR FIVE SCREWS;  Surgeon: Consuella Lose, MD;  Location: [facility];  Service: Neurosurgery;  Laterality: N/A;   IMPLANTATION / PLACEMENT EPIDURAL NEUROSTIMULATOR ELECTRODES     INTERCOSTAL NERVE BLOCK Right 07/29/2020   Procedure: INTERCOSTAL NERVE BLOCK;  Surgeon: Melrose Nakayama, MD;  Location: [facility];  Service: Thoracic;  Laterality: Right;   LAMINECTOMY     2006   lobectomy Right 07/2020   LUMBAR FUSION     LYMPH NODE BIOPSY Right 07/29/2020   Procedure: LYMPH NODE BIOPSY;  Surgeon: Melrose Nakayama, MD;  Location: [facility];  Service: Thoracic;  Laterality: Right;   POLYPECTOMY  06/14/2020   Procedure: POLYPECTOMY INTESTINAL;  Surgeon: Daneil Dolin, MD;  [facility];  Service: Endoscopy;;   SPINAL CORD STIMULATOR BATTERY EXCHANGE     SPINAL CORD STIMULATOR INSERTION N/A 07/05/2016   Procedure: REPLACEMENT OF LUMBAR SPINAL CORD STIMULATOR BATTERY;  Surgeon: Newman Pies, MD;  Location: [facility];  Service: Neurosurgery;  Laterality: N/A;   TONSILLECTOMY      Social History   Socioeconomic History   Marital status: Married    Spouse name: [name]   Number of children: 1   Years of education: Not  on file   Highest education level: Some college, no degree  Occupational History   Occupation: disability  Tobacco Use   Smoking status: Every Day    Packs/day: 0.25    Years: 35.00    Total pack years: 8.75    Types: Cigarettes   Smokeless tobacco: Never   Tobacco comments:    trying to quit  Vaping Use   Vaping Use: Never used  Substance and Sexual Activity   Alcohol use: Yes    Alcohol/week: 2.0 standard drinks of alcohol    Types: 2 Glasses of wine per week    Comment: occasionally   Drug use: No   Sexual activity: Yes    Birth control/protection: None  Other Topics Concern   Not on file  Social History Narrative   Retired, 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 1 son, deceased. 2 pet dogs. Enjoys watching TV.   Husband takes care of cooking, cleaning, driving, and helps her with ADLs including bathing   Social Determinants of Health   Financial Resource Strain: Low Risk  (04/01/2021)   Overall Financial Resource Strain (CARDIA)    Difficulty of Paying Living Expenses: Not very hard  Food Insecurity: No Food Insecurity (04/01/2021)   Hunger Vital Sign    Worried About Running Out of Food in the Last Year: Never true    Ran Out of Food in the Last Year: Never true  Transportation Needs: No Transportation Needs (04/01/2021)   PRAPARE - Hydrologist (Medical): No    Lack of Transportation (Non-Medical): No  Physical Activity: Insufficiently Active (04/01/2021)   Exercise Vital Sign    Days of Exercise per Week: 7 days    Minutes of Exercise per Session: 10 min  Stress: No Stress Concern Present (04/01/2021)   [place]    Feeling of Stress : Not at all  Social Connections: [place] (04/01/2021)   Social Connection  and Isolation Panel [NHANES]    Frequency of Communication with Friends and Family: More than three times a week    Frequency of Social Gatherings with Friends and Family: More than three times a week    Attends Religious Services: 1 to 4 times per year    Active Member of [employer] or Organizations: Yes    Attends Archivist Meetings: 1 to 4 times per year    Marital Status: Married  Human resources officer Violence: Not At Risk (04/01/2021)   Humiliation, Afraid, Rape, and Kick questionnaire    Fear of Current or Ex-Partner: No    Emotionally Abused: No    Physically Abused: No    Sexually Abused: No   Family History  Problem Relation Age of Onset   Heart disease Mother    Diabetes Mother    Hypertension Mother    Hyperlipidemia Mother    COPD Mother        smoked   Cancer Mother        bile duct   COPD Father    Diabetes Father    Heart disease Father    Cancer Father        throat   Dementia Father    Hypertension Sister    Hyperlipidemia Sister    Hypertension Brother    Hyperlipidemia Brother    Hypertension Brother    Hyperlipidemia Brother    Emphysema Maternal Grandmother        smoked  Asthma Son    Colon cancer Neg Hx    Colon polyps Neg Hx    Pancreatic cancer Neg Hx    Esophageal cancer Neg Hx    Inflammatory bowel disease Neg Hx    Liver disease Neg Hx    Rectal cancer Neg Hx    Stomach cancer Neg Hx    I have reviewed her medical, social, and family history in detail and updated the electronic medical record as necessary.    PHYSICAL EXAMINATION  BP (!) 142/82   Pulse (!) 110   Ht _0  (1.6 m)   Wt 114 lb (51.7 kg)   SpO2 99%   BMI 20.19 kg/m  Wt Readings from Last 3 Encounters:  01/03/22 114 lb (51.7 kg)  11/14/21 108 lb (49 kg)  11/02/21 109 lb 3.2 oz (49.5 kg)  GEN: NAD, appears chronically ill, appears older than stated age, accompanied by husband  PSYCH: Cooperative, without pressured speech EYE: Conjunctivae pink, sclerae  anicteric ENT: MMM, nasal cannula in place CV: Nontachycardic RESP: Some audible wheezing appreciated GI: NABS, soft, NT/ND, without rebound or guarding MSK/EXT: Trace bilateral pedal edema SKIN: No jaundice, NEURO:  Alert & Oriented x 3, no focal deficits   REVIEW OF DATA  I reviewed the following data at the time of this encounter:  GI Procedures and Studies  April 2022 EGD - Normal esophagus. - Erosive gastropathy -predominantly antrum. Marked erythema. No ulcer or infiltrating process. Patent pylorus. Antrum biopsied - Normal duodenal bulb and second portion of the duodenum.  April 2022 Colonoscopy - Four 6 to 9 mm polyps at the splenic flexure and at the hepatic flexure, removed with a cold snare. Resected and retrieved. - One 20 mm polyp at the hepatic flexure, removed with a hot snare. Resected and retrieved. - The examination was otherwise normal on direct and retroflexion views.  Pathology FINAL MICROSCOPIC DIAGNOSIS:  A. STOMACH, BIOPSY:  - Gastric antral mucosa with hyperemia and mild reactive changes.  - Warthin-Starry negative for Helicobacter pylori.  - No intestinal metaplasia, dysplasia or carcinoma.  B. COLON, SPLENIC FLEXURE, POLYPECTOMY:  - Tubular adenoma (s).  - No high-grade dysplasia or carcinoma.  C. COLON, HEPATIC FLEXURE, POLYPECTOMY:  - Tubular adenoma (s).  - No high-grade dysplasia or carcinoma.   Laboratory Studies  Reviewed those in [facility]  Imaging Studies  May 2023 CTCAP IMPRESSION: 1. No aorta aneurysm or dissection. Aortic aneurysm NOS (ICD10-I71.9). 2. Interval development of a 1.2 x 1 2.6 cm nodular-like consolidation within the right lower lobe. Finding likely represents infection. Underlying malignancy not fully excluded but less likely given appearance of the right upper lobe 1 month ago. Additional imaging evaluation or consultation with Pulmonology or Thoracic Surgery recommended. 3. Interval increase in partial collapse and  patchy airspace opacity of the right middle lobe. Finding may represent a combination of atelectasis versus infection/inflammation. 4. Status post right upper lobectomy. 5. Interval development of a 1.2 x 1 2.6 cm nodular like consolidation within the right lower lobe. 6. Enlarged common bile duct with associated persistent nonspecific gallbladder wall thickening. Gallbladder wall thickening can be seen in the setting of chronic liver disease versus acute cholecystitis. Correlate with liver function tests. 7. Indeterminate 1.1 cm left renal lesion. Finding may represent a minimally complex renal cyst versus solid renal lesion. Consider nonemergent further evaluation with MRI or CT renal protocol. 8. Aortic Atherosclerosis (ICD10-I70.0).  September 2023 CTAP IMPRESSION: 1. Increased dilation of the main pancreatic duct measuring 6 mm  on today's exam. Similar dilation of the common bile duct measuring 17 mm. No definite obstructing lesion and increased pancreatic ductal dilation may be due to stricturing from chronic pancreatitis. Consider further evaluation with MRCP or ERCP. 2. Coarse calcifications in the pancreatic head compatible with sequela of chronic pancreatitis. 3. Indeterminate 1.4 cm right renal lesion and 1.1 cm left renal lesion. These may represent hemorrhagic or proteinaceous cysts however are indeterminate. Further evaluation with MRI or CT with renal protocol is recommended. 4.  Aortic Atherosclerosis (ICD10-I70.0).   ASSESSMENT  Ms. Funderburk is a 66 y.o. female with a pmh significant for COPD (on 6 to 8 L O2), lung cancer (status post partial lobectomy), chronic back pain (on chronic opioids), hyperlipidemia, hypertension, nephrolithiasis, colon polyps (TA's), GERD, idiopathic pancreatitis, abnormal pancreas imaging with dilated duct, chronically dilated bile duct, low fecal elastase concerning for EPI.  The patient is seen today for evaluation and management of:  1.  Chronic pancreatitis, unspecified pancreatitis type ([place])   2. Exocrine pancreatic insufficiency   3. Common bile duct dilation   4. Dilation of pancreatic duct   5. Abnormal CT of the abdomen    The patient is hemodynamically stable.  In normal circumstances, and upper endoscopic ultrasound would be recommended for this individual.  Unfortunately due to her significant oxygen requirement, I think an elective endoscopic ultrasound does carry risks.  I suspect her chronic dilation of her bile duct is most likely a result of her chronic opioid therapy.  What is concerning to me is what could be the etiology of her likely chronic pancreatitis and her pancreatic stricture.  As well, if she has a true stricture is this a potential etiology or reason why she has had episodes of recurrent pancreatitis.  It is interesting that she felt overall better in terms of energy and digestion when she was on Creon supplementation and with her low fecal elastase I think it makes sense for [place] to restart that.  We will go ahead and restart her Creon and this will be outlined below and she will be sent in a 90-day mail-in prescription.  Unfortunately as she cannot get an MRI/MRCP pancreas protocol CT remains probably the best scan for her.  After a thorough discussion and her desire of trying not to be at risk of anesthesia currently, we have agreed to moving forward with a pancreas protocol CT scan within the next 3 months.  Depending on how things look we will decide what neck steps will be based on how she is also feeling and doing being back on PERT therapy.  I will send a communication to the patient's primary pulmonologist to let him know about thoughts from a anesthesia perspective if we did need to pursue an EUS to rule out an occult malignancy that could be hiding because that would be the other concern in the setting of her double duct sign.  The risks of an EUS including intestinal perforation, bleeding, infection,  aspiration, and medication effects were discussed as was the possibility it may not give a definitive diagnosis if a biopsy is performed.  When a biopsy of the pancreas is done as part of the EUS, there is an additional risk of pancreatitis at the rate of about 1-2%.  It was explained that procedure related pancreatitis is typically mild, although it can be severe and even life threatening, which is why we do not perform random pancreatic biopsies and only biopsy a lesion/area we feel is concerning enough to  warrant the risk.  For now we will hold on endoscopic reevaluation.  All patient questions were answered to the best of my ability, and the patient agrees to the aforementioned plan of action with follow-up as indicated.   PLAN  Laboratories as outlined below Creon supplementation - 72,000 units with each meal - 36,000 units with each snack - 90-day supply to be sent per patient request Pancreas protocol CT in the next 2 to 3 months Follow-up after CT in clinic Send notation to PCP as well as to pulmonologist in case we do need to consider an EUS if there is anything else to risk stratify (suspect not due to her already advanced lung disease) Any other GI issues will be with the [facility] for follow-up   Orders Placed This Encounter  Procedures   CBC   Comp Met (CMET)   Amylase   Lipase    New Prescriptions   No medications on file   Modified Medications   Modified Medication Previous Medication   PANCRELIPASE, LIP-PROT-AMYL, 24000-76000 UNITS CPEP lipase/protease/amylase 24000-76000 units CPEP      Take 2 caps daily with meals and 1 cap with snacks    Take 2 caps daily with meals and 1 cap with snacks    Planned Follow Up No follow-ups on file.   Total Time in Face-to-Face and in Coordination of Care for patient including independent/personal interpretation/review of prior testing, medical history, examination, medication adjustment, communicating results with the  patient directly, and documentation within the EHR is 45 minutes.   Justice Britain, MD [facility] Office # [phone]

## 2022-01-06 DIAGNOSIS — K838 Other specified diseases of biliary tract: Secondary | ICD-10-CM | POA: Insufficient documentation

## 2022-01-06 DIAGNOSIS — K8681 Exocrine pancreatic insufficiency: Secondary | ICD-10-CM | POA: Insufficient documentation

## 2022-01-06 DIAGNOSIS — K8689 Other specified diseases of pancreas: Secondary | ICD-10-CM | POA: Insufficient documentation

## 2022-01-09 ENCOUNTER — Ambulatory Visit: Payer: Medicare Other | Admitting: Internal Medicine

## 2022-01-11 ENCOUNTER — Telehealth: Payer: Self-pay | Admitting: Internal Medicine

## 2022-01-11 ENCOUNTER — Ambulatory Visit (INDEPENDENT_AMBULATORY_CARE_PROVIDER_SITE_OTHER): Payer: Medicare Other | Admitting: Internal Medicine

## 2022-01-11 ENCOUNTER — Encounter: Payer: Self-pay | Admitting: Internal Medicine

## 2022-01-11 VITALS — BP 132/74 | HR 90 | Temp 97.7°F | Ht 63.0 in | Wt 116.6 lb

## 2022-01-11 DIAGNOSIS — J9611 Chronic respiratory failure with hypoxia: Secondary | ICD-10-CM

## 2022-01-11 DIAGNOSIS — J449 Chronic obstructive pulmonary disease, unspecified: Secondary | ICD-10-CM

## 2022-01-11 DIAGNOSIS — F17210 Nicotine dependence, cigarettes, uncomplicated: Secondary | ICD-10-CM | POA: Diagnosis not present

## 2022-01-11 MED ORDER — ALBUTEROL SULFATE HFA 108 (90 BASE) MCG/ACT IN AERS: INHALATION_SPRAY | RESPIRATORY_TRACT | 3 refills | Status: DC

## 2022-01-11 MED ORDER — BUDESONIDE 0.25 MG/2ML IN SUSP: 0.2500 mg | Freq: Two times a day (BID) | RESPIRATORY_TRACT | 12 refills | Status: DC

## 2022-01-11 MED ORDER — YUPELRI 175 MCG/3ML IN SOLN: 175.0000 ug | Freq: Every day | RESPIRATORY_TRACT | 11 refills | Status: DC

## 2022-01-11 MED ORDER — FORMOTEROL FUMARATE 20 MCG/2ML IN NEBU: 20.0000 ug | INHALATION_SOLUTION | Freq: Two times a day (BID) | RESPIRATORY_TRACT | 11 refills | Status: DC

## 2022-01-11 NOTE — Patient Instructions (Addendum)
For cough >  mucinex dm 1200 mg every 12 hours and use the flutter valve as much as you can - do not take delsym  Plan A = Automatic = Always=  performist/yupelri and budesonide nebulized in am and repeat perfomist and budesonide 12 hours later (stay on Breztri until you have them)   Plan B = Backup (to supplement plan A, not to replace it) Only use your albuterol inhaler as a rescue medication to be used if you can't catch your breath by resting or doing a relaxed purse lip breathing pattern.  - The less you use it, the better it will work when you need it. - Ok to use the inhaler up to 2 puffs  every 4 hours if you must but call for appointment if use goes up over your usual need - Don't leave home without it !!  (think of it like the spare tire for your car)   Plan C = Crisis (instead of Plan B but only if Plan B stops working) - only use your albuterol nebulizer if you first try Plan B and it fails to help > ok to use the nebulizer up to every 4 hours but if start needing it regularly call for immediate appointment   Plan D = Deltasone  - if plan ABC not working > Prednisone 10 mg take  4 each am x 2 days,   2 each am x 2 days,  1 each am x 2 days and stop  ( this is a refillable prescription)   The key is to stop smoking completely before smoking completely stops you!      Please schedule a follow up office visit in 6 weeks, sooner if needed  with all medications /inhalers/ solutions in hand

## 2022-01-11 NOTE — Telephone Encounter (Signed)
Reordered Neb solutions with dx code J44.9 for COPD and faxed ov notes form todays ov to [phone]

## 2022-01-11 NOTE — Progress Notes (Unsigned)
Subjective:     Patient ID: Jasmine [name], female   DOB: 25-Apr-1955    MRN: [id number]    Brief patient profile: 44  yowf   active smoker/MM  dx with asthma as child but by HS outgrew it until her 89's with flares but better between until early 2000s and maintain on multliple inhalers and then 02/2016 hosp in [place] then [facility] April 2019 and newly started on 02 = 2lpm as needed daytime and hs and referred to pulmonary clinic 08/24/2017 by Dr   Danice Goltz with GOLD II spirometry documented 09/21/17     02/19/2020  f/u ov/[place] office/Jasmine [name] re: GOLD II/ 02 dep still smoking Chief Complaint  Patient presents with   Follow-up    Here for o2 recert- c/o waking up in the night coughing and "gagging" x 3 months. Cough is occ prod with clear sputum. She is using her albuterol inhaler about once per day.   Dyspnea: foot drop limiting / around the house doe  Cough: nightly cough/ gag 4 h after hs  and continues until gets up and uses saba in am / min clear mucus  Sleeping: bed is flat with wedge  SABA use: as above/ no neb  02: 2lpm hs  On ppi with breakfast  Rec Protonix 40 mg Take 30- 60 min before your first and last meals of the day  GERD diet reviewed, bed blocks rec  We will be scheduling you ONO on room air to qualify you for 02  Please remember to go to the  x-ray department  pos SPN CTchest 03/19/20 : 1. 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 After surgery  and lately she reports a cough with clear mucus.  - MR   Dyspnea:  limited by R foot drop / using rollator  Cough: still coughing thick clear mucus esp in am / using delyms  Sleeping: bed is flat/ some am congestion  SABA use: too much  02: 4lpm 24/7  Rec For cough/ congestion > mucinex dm 1200 mg every 12 hours as needed and use flutter valve  The key is to stop smoking completely before smoking completely stops you! Plan A = Automatic = Always=  Symbicort 160 / spiriva 2 of each then 12 hours later 2 puffs of symbicort  Plan B = Backup (to supplement plan A, not to replace it) Only use your albuterol inhaler as a rescue medication  Plan C = Crisis (instead of Plan B but only if Plan B stops working) - only use your albuterol nebulizer if you first try Plan B and it fails to help  Ok to try albuterol 15 min before an activity (on alternating days)  that you know would usually make you short of breath  We will contact [facility] to do a  best fit evaluation  - late add did not need amb 02 based on today's eval   Make sure you check your oxygen saturation  at your highest level of activity  to be sure it stays over 90% and adjust  02 flow upward to maintain this level if needed but remember to turn it back to previous settings when you stop (to conserve your supply).   Please schedule a follow up visit in 3 months but call sooner if needed   Admit date: 09/26/2021   Discharge date: 09/29/2021   Admitted From:Home     Brief/Interim Summary: Jasmine [name] is a 66 y.o. female with medical history significant of allergies, anemia, COPD, smoker, GERD, hypertension, lung cancer, and more presents ED with a chief complaint of dyspnea.  Patient has been admitted for acute COPD exacerbation and remained on her usual 5 L nasal cannula oxygen, but required several days of IV Solu-Medrol and breathing treatments as well as brief use of Zithromax to help improve her symptoms.  Chest x-ray did not  demonstrate any acute findings.  She now feels as though she is back to baseline and is in stable condition for discharge and will resume breathing treatments as needed at home and will continue on prednisone as prescribed for several more days.  She was also seen by palliative care while admitted and will be set up with outpatient palliative services.   Discharge Diagnoses:  Principal Problem:   COPD exacerbation ([place])   HTN (hypertension)   Cigarette smoker   Chronic respiratory failure with hypoxia (HCC)   GERD (gastroesophageal reflux disease)   Chronic pain syndrome   Chronic pancreatitis ([place])   Hyperlipidemia  10/19/2021  f/u ov/ office/Jasmine [name] re: GOLD 2/ 02 dep  maint on wix/spiriva   Chief Complaint  Patient presents with   Follow-up    On 5LO2 cont 24/7  Feels breathing is worse since last ov.  On wixella inhaler needs refill on albuterol   Dyspnea: Rollator due R sided weakness  Cough: rattling /congested sounding cough min mucoid sputum Sleeping: recliner x 45 degrees x 3 months  SABA use: albuterol 8 x per day / neb twice daily not prechallenging  02: 5lpm 24/7  Rec Stop wexilla and spiriva and breztri Take 2 puffs first thing in am and then another 2 puffs about 12 hours later.  For cough >  mucinex dm 1200 mg every 12 hours and use the flutter valve as much as you can - do not take delsym Plan A = Automatic = Always=    breztri Take 2 puffs first thing in am and then another 2 puffs about 12 hours later.   Work on inhaler technique:  - remember how  golfers warm up - take practice breaths with your empty inhalers  Plan B = Backup (to supplement plan A, not to replace it) Only use your albuterol inhaler as a rescue medication Plan C = Crisis (instead of Plan B but only if Plan B stops working) - only use your albuterol nebulizer if you first try Plan B and it fails to help > ok to use the nebulizer up to every 4 hours but if start needing it regularly call for  immediate appointment Plan D = Deltasone  - if plan ABC not working > Prednisone 10 mg take  4 each am x 2 days,   2 each am x 2 days,  1 each am x 2 days and stop  ( this  is a refillable prescription) The key is to stop smoking completely before smoking completely stops you! Please schedule a follow up office visit in 2 weeks, sooner if needed  with all medications /inhalers/ solutions in hand   11/02/2021  f/u ov/[place] office/Jasmine [name] re: GOLD 2/ 02 dep  maint on breztri/ saba and prn pred x 6 days  / still smoking Chief Complaint  Patient presents with   Follow-up    Patient thinks she has improved a little.    Dyspnea:  rollator around the house  Cough: rattling / clear / thick mucus esp in am  Sleeping: same recliner/ angle  SABA use: hfa 2-3 x per day/ neb twice / only hfa one today  02: 4-5lpm  Covid status: vax x 3  Rec For cough > mucinex 1200 mg every 12 hours and use the flutter valve as much as possible  Work on inhaler technique:  The key is to stop smoking completely before smoking completely stops you! Please schedule a follow up office visit in 6 weeks, call sooner if needed    01/11/2022  f/u ov/[place] office/Jasmine [name] re: GOLD 2  maint on breztri   and pred x 6 d finished 2 days  prior to [place]  / still smoking  Chief Complaint  Patient presents with   Follow-up    Breathing has worsened since last ov   Dyspnea:  only goes out for doctor visits/ using 4 wheeled high walker room to room at home  Cough: nothing purulent  Sleeping: recliner/ 45 degrees x years  SABA use: saba once or twice daily /had been using neb every 4 hours for a week  02: 4-6 lpm  sleeping on 6lpm  Covid status: vax max      No obvious day to day or daytime variability or assoc excess/ purulent sputum or mucus plugs or hemoptysis or cp or chest tightness, subjective wheeze or overt sinus or hb symptoms.   *** without nocturnal  or early am exacerbation  of respiratory  c/o's or need for noct  saba. Also denies any obvious fluctuation of symptoms with weather or environmental changes or other aggravating or alleviating factors except as outlined above   No unusual exposure hx or h/o childhood pna/ asthma or knowledge of premature birth.  Current Allergies, Complete Past Medical History, Past Surgical History, Family History, and Social History were reviewed in [employer] record.  ROS  The following are not active complaints unless bolded Hoarseness, sore throat, dysphagia, dental problems, itching, sneezing,  nasal congestion or discharge of excess mucus or purulent secretions, ear ache,   fever, chills, sweats, unintended wt loss or wt gain, classically pleuritic or exertional cp,  orthopnea pnd or arm/hand swelling  or leg swelling, presyncope, palpitations, abdominal pain, anorexia, nausea, vomiting, diarrhea  or change in bowel habits or change in bladder habits, change in stools or change in urine, dysuria, hematuria,  rash, arthralgias, visual complaints, headache, numbness, weakness or ataxia or problems with walking or coordination,  change in mood or  memory.        Current Meds  Medication Sig   albuterol (VENTOLIN HFA) 108 (90 Base) MCG/ACT inhaler 2puffs up to every 4 hours if can't catch your breath   amLODipine (NORVASC) 10 MG tablet Take 1 tablet (10 mg total) by mouth daily.   atorvastatin (LIPITOR) 40 MG tablet Take 1 tablet (40 mg total) by mouth daily.   bisoprolol (ZEBETA) 5 MG tablet Take 0.5 tablets (  2.5 mg total) by mouth daily. Take in Place of Metoprolol   Budeson-Glycopyrrol-Formoterol (BREZTRI AEROSPHERE) 160-9-4.8 MCG/ACT AERO Inhale 2 puffs into the lungs in the morning and at bedtime.   dextromethorphan (DELSYM) 30 MG/5ML liquid Take 15 mg by mouth as needed for cough.   diclofenac Sodium (VOLTAREN) 1 % GEL Apply 2 g topically 4 (four) times daily.   magic mouthwash SOLN Take 5 mLs by mouth 3 (three) times daily as needed for mouth  pain.   mirabegron ER (MYRBETRIQ) 25 MG TB24 tablet Take 1 tablet (25 mg total) by mouth daily.   mirtazapine (REMERON) 15 MG tablet Take 1 tablet (15 mg total) by mouth at bedtime.   Nutritional Supplements (ENSURE HIGH PROTEIN) LIQD Drink 1 can 3 times daily.   omeprazole (PRILOSEC) 40 MG capsule Take 1 capsule (40 mg total) by mouth 2 (two) times daily with a meal.   oxyCODONE-acetaminophen (PERCOCET) 10-325 MG tablet Take 1 tablet by mouth 5 (five) times daily.   OXYCONTIN 15 MG 12 hr tablet Take 15 mg by mouth every 12 (twelve) hours.   OXYGEN Inhale 4 L into the lungs See admin instructions. continuous   Pancrelipase, Lip-Prot-Amyl, 24000-76000 units CPEP Take 2 caps daily with meals and 1 cap with snacks   predniSONE (DELTASONE) 10 MG tablet Take by mouth.   prochlorperazine (COMPAZINE) 25 MG suppository Place 1 suppository (25 mg total) rectally every 12 (twelve) hours as needed for nausea or vomiting.   tiZANidine (ZANAFLEX) 4 MG capsule Take 1 capsule (4 mg total) by mouth 3 (three) times daily as needed for muscle spasms.   traZODone (DESYREL) 150 MG tablet TAKE 1 TO 2 TABLETS AT BEDTIME (Patient taking differently: Take 225 mg by mouth at bedtime.)                             Objective:   Physical Exam   Wts  01/11/2022          ***  11/02/2021           109  10/19/2021           108  11/03/2020         92 02/19/2020       96  08/05/2019           98.6  01/09/2018        105  09/21/2017        104   08/24/17 105 lb 9.6 oz (47.9 kg)  07/31/17 106 lb (48.1 kg)  07/26/17 105 lb 3.2 oz (47.7 kg)    Vital signs reviewed  01/11/2022  - Note at rest 02 sats  ***% on ***   General appearance:    w/c bound, much older than sated age rollator / smoker's rattle    HEENT :  Oropharynx  ***  Nasal turbinates ***   NECK :  without JVD/Nodes/TM/ nl carotid upstrokes bilaterally   LUNGS: no acc muscle use,  Mod barrel  contour chest wall with bilateral  Distant bs s audible  wheeze and  without cough on insp or exp maneuvers and mod  Hyperresonant  to  percussion bilaterally     CV:  RRR  no s3 or murmur or increase in P2, and no edema   ABD:  soft and nontender with pos mid insp Hoover's  in the supine position. No bruits or organomegaly appreciated, bowel sounds nl  MS:   Ext  warm without deformities or   obvious joint restrictions , calf tenderness, cyanosis or clubbing  SKIN: warm and dry without lesions    NEURO:  alert, approp, nl sensorium with  no motor or cerebellar deficits apparent.                                 Assessment:

## 2022-01-12 ENCOUNTER — Encounter: Payer: Self-pay | Admitting: Internal Medicine

## 2022-01-12 NOTE — Assessment & Plan Note (Signed)
4-5 min discussion re active cigarette smoking in addition to office E&M  Ask about tobacco use:   ongoing Advise quitting   I took an extended  opportunity with this patient to outline the consequences of continued cigarette use  in airway disorders based on all the data we have from the multiple national lung health studies (perfomed over decades at millions of dollars in cost)  indicating that smoking cessation, not choice of inhalers or physicians, is the most important aspect of hercare.   Assess willingness:  Not committed at this point Assist in quit attempt:  Per PCP when ready Arrange follow up:   Follow up per Primary Care planned       

## 2022-01-12 NOTE — Assessment & Plan Note (Addendum)
Active smoker 08/24/2017  > try spiriva respimat 2.5  X 2 puffs each am  - PFT's  09/21/2017  FEV1 1.61 (72 % ) ratio 63  p 14 % improvement from saba p nothing prior to study with DLCO  55 % corrects to 62 % for alv volume   - alpha one screen 09/21/2017  MM level 191  - 11/03/2020   > continue symb/spiriva > symbicort d/c by insurance and admit x 3 - Prednisone as plan D x 6 day courses  10/20/2021 >>>  - 11/02/2021  After extensive coaching inhaler device,  effectiveness =    60% (short ti) with hfa but doing better on breztri so continue 2bid and approp saba  - 01/11/2022 hfa < 50 % so changed to neb laba/lama/ics   Group D (now reclassified as E) in terms of symptom/risk and laba/lama/ICS  therefore appropriate rx at this point >>>  breztri but not able to inhale effectively so try neb as should be covered by medicare B plus tricare   ABCD action plan re-vamped and reviewed line by line with Plan D for prednisone as before and approp saba:  Re SABA :  I spent extra time with pt today reviewing appropriate use of albuterol for prn use on exertion with the following points: 1) saba is for relief of sob that does not improve by walking a slower pace or resting but rather if the pt does not improve after trying this first. 2) If the pt is convinced, as many are, that saba helps recover from activity faster then it's easy to tell if this is the case by re-challenging : ie stop, take the inhaler, then p 5 minutes try the exact same activity (intensity of workload) that just caused the symptoms and see if they are substantially diminished or not after saba 3) if there is an activity that reproducibly causes the symptoms, try the saba 15 min before the activity on alternate days   If in fact the saba really does help, then fine to continue to use it prn but advised may need to look closer at the maintenance regimen being used to achieve better control of airways disease with exertion.

## 2022-01-12 NOTE — Assessment & Plan Note (Signed)
08/24/2017  DME- [facility]  o2 2lpm with sleep and as needed daytime -  12/02/2018 ambulatory O2 walk test with no desaturations.   -  08/05/2019   Walked RA  approx   200 ft  @ slow pace  stopped due to  Sob/ no desats    -  02/19/2020   Walked RA  approx  275ft  @ slow pace  stopped due to  Sob/ sats still 94%    - ONO RA  03/09/2020 :   desats on RA < 89% x 2 h 39 min > 03/23/2020 rec 2lpm   - 11/03/2020 no desats walking 450 ft RA  - ONO 11/18/20  ONO RA deat < 89% x 1 h 29 min > rec 2lpm hs    Advised again to titrate to sats > 90% daytime          Each maintenance medication was reviewed in detail including emphasizing most importantly the difference between maintenance and prns and under what circumstances the prns are to be triggered using an action plan format where appropriate.  Total time for H and P, chart review, counseling, reviewing hfa/neb/02 device(s) and generating customized AVS unique to this office visit / same day charting > 30 min

## 2022-01-13 ENCOUNTER — Telehealth: Payer: Self-pay | Admitting: Internal Medicine

## 2022-01-13 NOTE — Telephone Encounter (Signed)
Faced over ov notes to [phone]

## 2022-01-30 ENCOUNTER — Ambulatory Visit: Payer: Medicare Other | Admitting: Family Medicine

## 2022-02-04 ENCOUNTER — Encounter (HOSPITAL_COMMUNITY): Payer: Self-pay

## 2022-02-04 ENCOUNTER — Other Ambulatory Visit: Payer: Self-pay

## 2022-02-04 ENCOUNTER — Inpatient Hospital Stay (HOSPITAL_COMMUNITY)
Admission: EM | Admit: 2022-02-04 | Discharge: 2022-02-14 | DRG: 439 | Disposition: A | Discharge status: Home / Self Care | Payer: Medicare Other | Attending: Family Medicine | Admitting: Family Medicine

## 2022-02-04 ENCOUNTER — Emergency Department (HOSPITAL_COMMUNITY): Payer: Medicare Other

## 2022-02-04 DIAGNOSIS — Z825 Family history of asthma and other chronic lower respiratory diseases: Secondary | ICD-10-CM

## 2022-02-04 DIAGNOSIS — J9611 Chronic respiratory failure with hypoxia: Secondary | ICD-10-CM | POA: Diagnosis present

## 2022-02-04 DIAGNOSIS — K859 Acute pancreatitis without necrosis or infection, unspecified: Secondary | ICD-10-CM | POA: Diagnosis not present

## 2022-02-04 DIAGNOSIS — I10 Essential (primary) hypertension: Secondary | ICD-10-CM | POA: Diagnosis present

## 2022-02-04 DIAGNOSIS — Z8249 Family history of ischemic heart disease and other diseases of the circulatory system: Secondary | ICD-10-CM

## 2022-02-04 DIAGNOSIS — K219 Gastro-esophageal reflux disease without esophagitis: Secondary | ICD-10-CM | POA: Diagnosis present

## 2022-02-04 DIAGNOSIS — K8689 Other specified diseases of pancreas: Secondary | ICD-10-CM | POA: Diagnosis present

## 2022-02-04 DIAGNOSIS — E876 Hypokalemia: Secondary | ICD-10-CM | POA: Diagnosis not present

## 2022-02-04 DIAGNOSIS — Z833 Family history of diabetes mellitus: Secondary | ICD-10-CM

## 2022-02-04 DIAGNOSIS — Z79899 Other long term (current) drug therapy: Secondary | ICD-10-CM

## 2022-02-04 DIAGNOSIS — J432 Centrilobular emphysema: Secondary | ICD-10-CM | POA: Diagnosis present

## 2022-02-04 DIAGNOSIS — K861 Other chronic pancreatitis: Secondary | ICD-10-CM | POA: Diagnosis present

## 2022-02-04 DIAGNOSIS — Z1152 Encounter for screening for COVID-19: Secondary | ICD-10-CM

## 2022-02-04 DIAGNOSIS — Z7951 Long term (current) use of inhaled steroids: Secondary | ICD-10-CM

## 2022-02-04 DIAGNOSIS — F17210 Nicotine dependence, cigarettes, uncomplicated: Secondary | ICD-10-CM | POA: Diagnosis present

## 2022-02-04 DIAGNOSIS — G8929 Other chronic pain: Secondary | ICD-10-CM | POA: Diagnosis present

## 2022-02-04 DIAGNOSIS — R1013 Epigastric pain: Secondary | ICD-10-CM

## 2022-02-04 DIAGNOSIS — E785 Hyperlipidemia, unspecified: Secondary | ICD-10-CM | POA: Diagnosis present

## 2022-02-04 DIAGNOSIS — Z83438 Family history of other disorder of lipoprotein metabolism and other lipidemia: Secondary | ICD-10-CM

## 2022-02-04 DIAGNOSIS — Z9682 Presence of neurostimulator: Secondary | ICD-10-CM

## 2022-02-04 DIAGNOSIS — F1120 Opioid dependence, uncomplicated: Secondary | ICD-10-CM | POA: Diagnosis present

## 2022-02-04 DIAGNOSIS — A084 Viral intestinal infection, unspecified: Secondary | ICD-10-CM | POA: Diagnosis present

## 2022-02-04 DIAGNOSIS — R112 Nausea with vomiting, unspecified: Secondary | ICD-10-CM

## 2022-02-04 DIAGNOSIS — K838 Other specified diseases of biliary tract: Secondary | ICD-10-CM | POA: Diagnosis present

## 2022-02-04 LAB — URINALYSIS, ROUTINE W REFLEX MICROSCOPIC
Bacteria, UA: NONE SEEN
Bilirubin Urine: NEGATIVE
Glucose, UA: NEGATIVE mg/dL
Hgb urine dipstick: NEGATIVE
Ketones, ur: 20 mg/dL — AB
Leukocytes,Ua: NEGATIVE
Nitrite: NEGATIVE
Protein, ur: 100 mg/dL — AB
Specific Gravity, Urine: 1.014 (ref 1.005–1.030)
pH: 6 (ref 5.0–8.0)

## 2022-02-04 LAB — COMPREHENSIVE METABOLIC PANEL
ALT: 13 U/L (ref 0–44)
AST: 18 U/L (ref 15–41)
Albumin: 3.6 g/dL (ref 3.5–5.0)
Alkaline Phosphatase: 111 U/L (ref 38–126)
Anion gap: 12 (ref 5–15)
BUN: 7 mg/dL — ABNORMAL LOW (ref 8–23)
CO2: 28 mmol/L (ref 22–32)
Calcium: 9.1 mg/dL (ref 8.9–10.3)
Chloride: 101 mmol/L (ref 98–111)
Creatinine, Ser: 0.58 mg/dL (ref 0.44–1.00)
GFR, Estimated: >60 mL/min (ref >60)
Glucose, Bld: 105 mg/dL — ABNORMAL HIGH (ref 70–99)
Potassium: 3.8 mmol/L (ref 3.5–5.1)
Sodium: 141 mmol/L (ref 135–145)
Total Bilirubin: 1 mg/dL (ref 0.3–1.2)
Total Protein: 7.3 g/dL (ref 6.5–8.1)

## 2022-02-04 LAB — CBC
HCT: 39.8 % (ref 36.0–46.0)
Hemoglobin: 12 g/dL (ref 12.0–15.0)
MCH: 32.7 pg (ref 26.0–34.0)
MCHC: 30.2 g/dL (ref 30.0–36.0)
MCV: 108.4 fL — ABNORMAL HIGH (ref 80.0–100.0)
Platelets: 227 10*3/uL (ref 150–400)
RBC: 3.67 MIL/uL — ABNORMAL LOW (ref 3.87–5.11)
RDW: 14.5 % (ref 11.5–15.5)
WBC: 4.8 10*3/uL (ref 4.0–10.5)
nRBC: 0 % (ref 0.0–0.2)

## 2022-02-04 LAB — LIPASE, BLOOD: Lipase: 25 U/L (ref 11–51)

## 2022-02-04 LAB — TROPONIN I (HIGH SENSITIVITY): Troponin I (High Sensitivity): 8 ng/L (ref <18)

## 2022-02-04 MED ORDER — IOHEXOL 300 MG/ML  SOLN
100.0000 mL | Freq: Once | INTRAMUSCULAR | Status: AC | PRN
  Administered 2022-02-04: 100 mL via INTRAVENOUS

## 2022-02-04 MED ORDER — MORPHINE SULFATE (PF) 4 MG/ML IV SOLN
4.0000 mg | INTRAVENOUS | Status: AC
  Administered 2022-02-04: 4 mg via INTRAVENOUS
  Filled 2022-02-04: qty 1

## 2022-02-04 MED ORDER — METOCLOPRAMIDE HCL 5 MG/ML IJ SOLN
10.0000 mg | Freq: Once | INTRAMUSCULAR | Status: AC
  Administered 2022-02-04: 10 mg via INTRAVENOUS
  Filled 2022-02-04: qty 2

## 2022-02-04 MED ORDER — LACTATED RINGERS IV BOLUS
500.0000 mL | Freq: Once | INTRAVENOUS | Status: AC
  Administered 2022-02-04: 500 mL via INTRAVENOUS

## 2022-02-04 NOTE — ED Triage Notes (Signed)
Pt BIB [facility]. Pt presents with lower abd pain, N/V that started 3 days ago. Pt states chest pain started after having a vomiting episode. Pt is on 7L via N/C at baseline. Pt states the pain and symptoms are similar to her chronic pancreatitis flares she has had in the past.   EMS Vitals.   155/90 HR 102 CBG 104

## 2022-02-04 NOTE — ED Provider Notes (Signed)
  Provider Note MRN:  [id number]  Arrival date & time: 02/05/22    ED Course and Medical Decision Making  Assumed care from Dr. Sharlett Iles at shift change.  Pancreatitis, chronic, on baseline 7 L oxygen for COPD, will reassess after symptomatic management.  Hopeful for discharge but may need admission.  1 AM update: Patient continues to feel unwell, does not feel able to go home, will request hospitalist admission for symptomatic management of chronic pancreatitis.  Procedures  Final Clinical Impressions(s) / ED Diagnoses     ICD-10-CM   1. Chronic pancreatitis, unspecified pancreatitis type [facility])  K86.1       ED Discharge Orders     None       Discharge Instructions   None     Barth Kirks. [place]onia Small, [place] [email or web address]    Maudie Flakes, MD 02/05/22 [phone]

## 2022-02-04 NOTE — ED Provider Notes (Signed)
[facility] Provider Note   CSN: [id number] Arrival date & time: 02/04/22  1827     History No chief complaint on file.   Jasmine [name] is a 66 y.o. 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  Patient reports that she has been compliant with her Creon medication.  She reports that she was having some nausea and vomiting but has increased today.  She denies any coffee-ground emesis or hematemesis.  She reports that she has diffuse abdominal pain, mainly is in the lower.  She reports soft stools but not watery.  No hematemesis or melena.  She reports that she has had some upper abdominal pain that she quantifies as "chest pain" because it radiates up into her chest.  Denies any fevers, chills, dysuria, or hematuria.  She reports that she tried a Phenergan suppository yesterday, but has not tried any other medications today.  HPI     Home Medications Prior to Admission medications   Medication Sig Start Date End Date Taking? Authorizing Provider  albuterol (VENTOLIN HFA) 108 (90 Base) MCG/ACT inhaler 2puffs up to every 4 hours if can't catch your breath 01/11/22   Tanda Rockers, MD  amLODipine (NORVASC) 10 MG tablet Take 1 tablet (10 mg total) by mouth daily. 09/26/21   Janora Norlander, DO  atorvastatin (LIPITOR) 40 MG tablet Take 1 tablet (40 mg total) by mouth daily. 07/09/20   Janora Norlander, DO  bisoprolol (ZEBETA) 5 MG tablet Take 0.5 tablets (2.5 mg total) by mouth daily. Take in Place of Metoprolol 08/07/21   Roxan Hockey, MD  Budeson-Glycopyrrol-Formoterol (BREZTRI AEROSPHERE) 160-9-4.8 MCG/ACT AERO Inhale 2 puffs into the lungs in the morning and at bedtime. 10/26/21   Tanda Rockers, MD  budesonide (PULMICORT) 0.25 MG/2ML nebulizer solution Take 2 mLs  (0.25 mg total) by nebulization in the morning and at bedtime. 01/11/22   Tanda Rockers, MD  dextromethorphan (DELSYM) 30 MG/5ML liquid Take 15 mg by mouth as needed for cough.    [provider]  diclofenac Sodium (VOLTAREN) 1 % GEL Apply 2 g topically 4 (four) times daily. 09/05/21   [provider]  formoterol (PERFOROMIST) 20 MCG/2ML nebulizer solution Take 2 mLs (20 mcg total) by nebulization 2 (two) times daily. Use in nebulizer twice daily perfectly regularly 01/11/22   Tanda Rockers, MD  magic mouthwash SOLN Take 5 mLs by mouth 3 (three) times daily as needed for mouth pain. 10/26/21   Janora Norlander, DO  mirabegron ER (MYRBETRIQ) 25 MG TB24 tablet Take 1 tablet (25 mg total) by mouth daily. 10/26/21   Janora Norlander, DO  mirtazapine (REMERON) 15 MG tablet Take 1 tablet (15 mg total) by mouth at bedtime. 09/26/21   Janora Norlander, DO  Nutritional Supplements (ENSURE HIGH PROTEIN) LIQD Drink 1 can 3 times daily. 08/30/20   Janora Norlander, DO  omeprazole (PRILOSEC) 40 MG capsule Take 1 capsule (40 mg total) by mouth 2 (two) times daily with a meal. 09/28/21   Ronnie Doss M, DO  oxyCODONE-acetaminophen (PERCOCET) 10-325 MG tablet Take 1 tablet by mouth 5 (five) times daily. 08/27/20   [provider]  OXYCONTIN 15 MG 12 hr tablet Take 15 mg by mouth every 12 (twelve) hours. 08/28/20   [provider]  OXYGEN  Inhale 4 L into the lungs See admin instructions. continuous    [provider]  Pancrelipase, Lip-Prot-Amyl, 24000-76000 units CPEP Take 2 caps daily with meals and 1 cap with snacks 01/03/22   Mansouraty, Telford Nab., MD  predniSONE (DELTASONE) 10 MG tablet Take by mouth. 12/27/21   [provider]  prochlorperazine (COMPAZINE) 25 MG suppository Place 1 suppository (25 mg total) rectally every 12 (twelve) hours as needed for nausea or vomiting. 08/07/21   Roxan Hockey, MD  revefenacin (YUPELRI) 175 MCG/3ML nebulizer  solution Inhale 3 mLs (175 mcg total) into the lungs daily. 01/11/22   Tanda Rockers, MD  tiZANidine (ZANAFLEX) 4 MG capsule Take 1 capsule (4 mg total) by mouth 3 (three) times daily as needed for muscle spasms. 09/13/21   Barton Dubois, MD  traZODone (DESYREL) 150 MG tablet TAKE 1 TO 2 TABLETS AT BEDTIME Patient taking differently: Take 225 mg by mouth at bedtime. 03/30/21   Janora Norlander, DO      Allergies    Lyrica [pregabalin], Darvon [propoxyphene], Gabapentin, Augmentin [amoxicillin-pot clavulanate], Erythromycin, and Vibramycin [doxycycline calcium]    Review of Systems   Review of Systems  Constitutional:  Negative for chills and fever.  Respiratory:  Negative for shortness of breath.   Cardiovascular:  Positive for chest pain.  Gastrointestinal:  Positive for abdominal pain, nausea and vomiting. Negative for blood in stool, constipation and diarrhea.  Genitourinary:  Negative for dysuria and hematuria.  Musculoskeletal:  Negative for back pain.    Physical Exam Updated Vital Signs BP (!) 178/95   Pulse 98   Temp 99.3 F (37.4 C) (Rectal)   Resp 20   Ht 5\' 3"  (1.6 m)   Wt 51.3 kg   SpO2 100%   BMI 20.02 kg/m  Physical Exam Vitals and nursing note reviewed.  Constitutional:      General: She is not in acute distress.    Appearance: She is not toxic-appearing.  HENT:     Mouth/Throat:     Mouth: Mucous membranes are dry.  Cardiovascular:     Rate and Rhythm: Normal rate.  Pulmonary:     Effort: Pulmonary effort is normal. No respiratory distress.     Breath sounds: No stridor.  Abdominal:     General: Bowel sounds are normal.     Palpations: Abdomen is soft.     Tenderness: There is abdominal tenderness. There is no right CVA tenderness, left CVA tenderness, guarding or rebound.     Comments: Diffuse tenderness, mainly in the lower abdomen. No guarding. NBS. No overlying skin changes noted.   Skin:    General: Skin is dry.     Findings: No rash.   Neurological:     Mental Status: She is alert.     ED Results / Procedures / Treatments   Labs (all labs ordered are listed, but only abnormal results are displayed) Labs Reviewed  COMPREHENSIVE METABOLIC PANEL - Abnormal; Notable for the following components:      Result Value   Glucose, Bld 105 (*)    BUN 7 (*)    All other components within normal limits  CBC - Abnormal; Notable for the following components:   RBC 3.67 (*)    MCV 108.4 (*)    All other components within normal limits  URINALYSIS, ROUTINE W REFLEX MICROSCOPIC - Abnormal; Notable for the following components:   Ketones, ur 20 (*)    Protein, ur 100 (*)    All other components  within normal limits  LIPASE, BLOOD  TROPONIN I (HIGH SENSITIVITY)  TROPONIN I (HIGH SENSITIVITY)    EKG EKG Interpretation  Date/Time:  Saturday February 04 2022 21:43:20 EST Ventricular Rate:  88 PR Interval:  177 QRS Duration: 78 QT Interval:  358 QTC Calculation: 434 R Axis:   15 Text Interpretation: Sinus rhythm Minimal ST depression, lateral leads Baseline wander in lead(s) V2 V3 V6 Confirmed by Margaretmary Eddy [phone]) on 02/04/2022 10:53:58 PM  Radiology CT ABDOMEN PELVIS W CONTRAST  Result Date: 02/04/2022 CLINICAL DATA:  Abdominal pain with nausea and vomiting. EXAM: CT ABDOMEN AND PELVIS WITH CONTRAST TECHNIQUE: Multidetector CT imaging of the abdomen and pelvis was performed using the standard protocol following bolus administration of intravenous contrast. RADIATION DOSE REDUCTION: This exam was performed according to the departmental dose-optimization program which includes automated exposure control, adjustment of the mA and/or kV according to patient size and/or use of iterative reconstruction technique. CONTRAST:  168mL OMNIPAQUE IOHEXOL 300 MG/ML  SOLN COMPARISON:  November 30, 2021 FINDINGS: Lower chest: No acute abnormality. Hepatobiliary: No focal liver abnormality is seen. No gallstones or gallbladder wall  thickening. The common bile duct is dilated and measures 1.5 cm in diameter. Mild to moderate severity central intrahepatic biliary dilatation is also seen. Pancreas: Diffuse atrophy of the pancreatic parenchyma is seen with subcentimeter parenchymal calcifications noted within the pancreatic head. Pancreatic ductal dilatation is noted (7.2 mm in diameter). Spleen: Normal in size without focal abnormality. Adrenals/Urinary Tract: Adrenal glands are unremarkable. Kidneys are normal in size, without renal calculi or hydronephrosis. Multiple stable, bilateral subcentimeter simple and indeterminate cysts are seen. The urinary bladder is unremarkable. Stomach/Bowel: Stomach is within normal limits. Appendix appears normal. No evidence of bowel wall thickening, distention, or inflammatory changes. Vascular/Lymphatic: Aortic atherosclerosis. No enlarged abdominal or pelvic lymph nodes. Reproductive: Status post hysterectomy. No adnexal masses. Other: No abdominal wall hernia or abnormality. No abdominopelvic ascites. Musculoskeletal: Postoperative changes are seen within the lumbar spine at the levels of L3, L4 and L5. IMPRESSION: 1. Findings consistent with chronic pancreatitis. 2. Stable, bilateral simple and indeterminate renal cysts. Correlation with nonemergent renal ultrasound is recommended. This recommendation follows ACR consensus guidelines: Management of the Incidental Renal Mass on CT: A White Paper of the ACR Incidental Findings Committee. J Am Coll Radiol [phone]. 3. Stable common bile duct and pancreatic ductal dilatation. Correlation with MRCP is recommended. 4. Postoperative changes within the lumbar spine. 5. Aortic atherosclerosis. Aortic Atherosclerosis (ICD10-I70.0). Electronically Signed   By: Virgina Norfolk M.D.   On: 02/04/2022 22:29   DG Chest 2 View  Result Date: 02/04/2022 CLINICAL DATA:  Chest pain. EXAM: CHEST - 2 VIEW COMPARISON:  Chest radiograph dated 09/26/2021. FINDINGS:  Background of emphysema and chronic interstitial coarsening. No focal consolidation, pleural effusion, pneumothorax. Right upper lobe scarring stable cardiomediastinal silhouette. Atherosclerotic calcification of the aortic arch. Thoracic spine stimulator wires. No acute osseous pathology. IMPRESSION: 1. No acute cardiopulmonary process. 2. Emphysema. Electronically Signed   By: Anner Crete M.D.   On: 02/04/2022 22:27    Procedures Procedures   Medications Ordered in ED Medications  iohexol (OMNIPAQUE) 300 MG/ML solution 100 mL (100 mLs Intravenous Contrast Given 02/04/22 2153)  metoCLOPramide (REGLAN) injection 10 mg (10 mg Intravenous Given 02/04/22 2313)  lactated ringers bolus 500 mL (500 mLs Intravenous New Bag/Given 02/04/22 2313)  morphine (PF) 4 MG/ML injection 4 mg (4 mg Intravenous Given 02/04/22 2322)    ED Course/ Medical Decision Making/ A&P Clinical Course as of  02/04/22 2326  Sat Feb 04, 2022  2149 O2 Flow Rate (L/min): 7 L/min [RP]    Clinical Course User Index [RP] Fransico Meadow, MD                           Medical Decision Making Amount and/or Complexity of Data Reviewed Labs: ordered. Radiology: ordered.  Risk Prescription drug management.   66 y/o F with history of chronic pancreatitis, presents the emergency room today for evaluation of 4 days of nausea and vomiting and lower belly pain.  Differential diagnosis includes but is not limited to pancreatitis, gastroenteritis, electrolyte abnormality, diverticulitis, colitis, small bowel obstruction, gallbladder etiology, GERD, PUD, ACS.  Vital signs show elevated blood pressure 170/95, temperature 99.3, otherwise normal heart rate, satting well on her 7 L of nasal cannula at baseline.  Physical exam as noted above.  Will order basic labs.  Given patient's diffuse abdominal tenderness as well as her chronic illness, will order CT imaging.  I independently reviewed and interpreted the patient's labs.  Troponin  8.  CMP shows mildly elevated glucose at 105.  Decreased BUN at 7 however normal creatinine and no anion gap.  No other electrolyte or LFT abnormalities.  Urinalysis shows 20 ketones and moderate protein otherwise unremarkable.  CBC shows no leukocytosis or anemia.  Lipase within normal limits.  CT imaging shows 1. Findings consistent with chronic pancreatitis. 2. Stable, bilateral simple and indeterminate renal cysts. Correlation with nonemergent renal ultrasound is recommended. This recommendation follows ACR consensus guidelines: Management of the Incidental Renal Mass on CT: A White Paper of the ACR Incidental Findings Committee. J Am Coll Radiol [phone]. 3. Stable common bile duct and pancreatic ductal dilatation. Correlation with MRCP is recommended. 4. Postoperative changes within the lumbar spine. 5. Aortic atherosclerosis.  The patient was give 553mL bolus of fluid as well as reglan and morphine for nausea and pain.   On chart evaluation, the patient was last seen by her GI provider on 01/03/22. CBD dilation was 23mm in Sept of 2023 and is 68mm today. They suspect the CBD dilation/chronic pancreatitis is from chronic opioid use. She is unable to be an MRCP due to a spinal stimulator being in place.   This may be a flair of the patient's chronic pancreatitis. She reports compliancy with her Creon medications. Will hand off to oncoming provider for PO check and reassess nausea/vomiting.   I discussed this case with my attending physician who cosigned this note including patient's presenting symptoms, physical exam, and planned diagnostics and interventions. Attending physician stated agreement with plan or made changes to plan which were implemented.   Attending physician assessed patient at bedside.  11:30 PM Care of Jasmine [name] transferred to Dr. Sedonia Small at the end of my shift as the patient will require reassessment once labs/imaging have resulted. Patient presentation, ED course,  and plan of care discussed with review of all pertinent labs and imaging. Please see his/her note for further details regarding further ED course and disposition. Plan at time of handoff is follow up on nausea/vomiting status. PO challenge. May need refills of phenergan suppositories. This may be altered or completely changed at the discretion of the oncoming team pending results of further workup.   Final Clinical Impression(s) / ED Diagnoses Final diagnoses:  None    Rx / DC Orders ED Discharge Orders     None         Sherrell Puller, [place]  02/04/22 2354    Fransico Meadow, MD 02/05/22 1339

## 2022-02-05 ENCOUNTER — Other Ambulatory Visit: Payer: Self-pay

## 2022-02-05 DIAGNOSIS — Z83438 Family history of other disorder of lipoprotein metabolism and other lipidemia: Secondary | ICD-10-CM | POA: Diagnosis not present

## 2022-02-05 DIAGNOSIS — K838 Other specified diseases of biliary tract: Secondary | ICD-10-CM | POA: Diagnosis present

## 2022-02-05 DIAGNOSIS — F17210 Nicotine dependence, cigarettes, uncomplicated: Secondary | ICD-10-CM | POA: Diagnosis present

## 2022-02-05 DIAGNOSIS — J9611 Chronic respiratory failure with hypoxia: Secondary | ICD-10-CM

## 2022-02-05 DIAGNOSIS — R1013 Epigastric pain: Secondary | ICD-10-CM | POA: Diagnosis not present

## 2022-02-05 DIAGNOSIS — J432 Centrilobular emphysema: Secondary | ICD-10-CM

## 2022-02-05 DIAGNOSIS — Z833 Family history of diabetes mellitus: Secondary | ICD-10-CM | POA: Diagnosis not present

## 2022-02-05 DIAGNOSIS — K859 Acute pancreatitis without necrosis or infection, unspecified: Secondary | ICD-10-CM | POA: Diagnosis not present

## 2022-02-05 DIAGNOSIS — K861 Other chronic pancreatitis: Secondary | ICD-10-CM | POA: Diagnosis not present

## 2022-02-05 DIAGNOSIS — Z1152 Encounter for screening for COVID-19: Secondary | ICD-10-CM | POA: Diagnosis not present

## 2022-02-05 DIAGNOSIS — K8689 Other specified diseases of pancreas: Secondary | ICD-10-CM

## 2022-02-05 DIAGNOSIS — R112 Nausea with vomiting, unspecified: Secondary | ICD-10-CM | POA: Diagnosis not present

## 2022-02-05 DIAGNOSIS — I10 Essential (primary) hypertension: Secondary | ICD-10-CM | POA: Diagnosis present

## 2022-02-05 DIAGNOSIS — Z7951 Long term (current) use of inhaled steroids: Secondary | ICD-10-CM | POA: Diagnosis not present

## 2022-02-05 DIAGNOSIS — F1120 Opioid dependence, uncomplicated: Secondary | ICD-10-CM | POA: Diagnosis present

## 2022-02-05 DIAGNOSIS — Z825 Family history of asthma and other chronic lower respiratory diseases: Secondary | ICD-10-CM | POA: Diagnosis not present

## 2022-02-05 DIAGNOSIS — F1129 Opioid dependence with unspecified opioid-induced disorder: Secondary | ICD-10-CM | POA: Diagnosis not present

## 2022-02-05 DIAGNOSIS — Z79899 Other long term (current) drug therapy: Secondary | ICD-10-CM | POA: Diagnosis not present

## 2022-02-05 DIAGNOSIS — E785 Hyperlipidemia, unspecified: Secondary | ICD-10-CM | POA: Diagnosis present

## 2022-02-05 DIAGNOSIS — G8929 Other chronic pain: Secondary | ICD-10-CM | POA: Diagnosis present

## 2022-02-05 DIAGNOSIS — K219 Gastro-esophageal reflux disease without esophagitis: Secondary | ICD-10-CM | POA: Diagnosis present

## 2022-02-05 DIAGNOSIS — Z8249 Family history of ischemic heart disease and other diseases of the circulatory system: Secondary | ICD-10-CM | POA: Diagnosis not present

## 2022-02-05 DIAGNOSIS — E876 Hypokalemia: Secondary | ICD-10-CM | POA: Diagnosis not present

## 2022-02-05 DIAGNOSIS — A084 Viral intestinal infection, unspecified: Secondary | ICD-10-CM | POA: Diagnosis present

## 2022-02-05 DIAGNOSIS — Z9682 Presence of neurostimulator: Secondary | ICD-10-CM | POA: Diagnosis not present

## 2022-02-05 LAB — TROPONIN I (HIGH SENSITIVITY): Troponin I (High Sensitivity): 9 ng/L (ref <18)

## 2022-02-05 LAB — SARS CORONAVIRUS 2 BY RT PCR: SARS Coronavirus 2 by RT PCR: NEGATIVE

## 2022-02-05 MED ORDER — INFLUENZA VAC A&B SA ADJ QUAD 0.5 ML IM PRSY: 0.5000 mL | PREFILLED_SYRINGE | INTRAMUSCULAR | Status: DC

## 2022-02-05 MED ORDER — ARFORMOTEROL TARTRATE 15 MCG/2ML IN NEBU
15.0000 ug | INHALATION_SOLUTION | Freq: Two times a day (BID) | RESPIRATORY_TRACT | Status: DC
  Administered 2022-02-05 – 2022-02-14 (×17): 15 ug via RESPIRATORY_TRACT
  Filled 2022-02-05 (×18): qty 2

## 2022-02-05 MED ORDER — ACETAMINOPHEN 650 MG RE SUPP: 650.0000 mg | Freq: Four times a day (QID) | RECTAL | Status: DC | PRN

## 2022-02-05 MED ORDER — BUDESONIDE 0.5 MG/2ML IN SUSP
0.5000 mg | Freq: Two times a day (BID) | RESPIRATORY_TRACT | Status: DC
  Administered 2022-02-05 – 2022-02-14 (×18): 0.5 mg via RESPIRATORY_TRACT
  Filled 2022-02-05 (×19): qty 2

## 2022-02-05 MED ORDER — HYDROMORPHONE HCL 1 MG/ML IJ SOLN
1.0000 mg | Freq: Once | INTRAMUSCULAR | Status: AC
  Administered 2022-02-05: 1 mg via INTRAVENOUS
  Filled 2022-02-05: qty 1

## 2022-02-05 MED ORDER — PROMETHAZINE HCL 25 MG/ML IJ SOLN
INTRAMUSCULAR | Status: AC
  Filled 2022-02-05: qty 1

## 2022-02-05 MED ORDER — HYDROMORPHONE HCL 1 MG/ML IJ SOLN
1.0000 mg | INTRAMUSCULAR | Status: DC | PRN
  Administered 2022-02-05 – 2022-02-06 (×9): 1 mg via INTRAVENOUS
  Filled 2022-02-05 (×9): qty 1

## 2022-02-05 MED ORDER — SODIUM CHLORIDE 0.9 % IV SOLN
12.5000 mg | Freq: Four times a day (QID) | INTRAVENOUS | Status: DC | PRN
  Administered 2022-02-05 – 2022-02-11 (×5): 12.5 mg via INTRAVENOUS
  Filled 2022-02-05: qty 0.5
  Filled 2022-02-05 (×2): qty 12.5
  Filled 2022-02-05: qty 0.5

## 2022-02-05 MED ORDER — ACETAMINOPHEN 325 MG PO TABS
650.0000 mg | ORAL_TABLET | Freq: Four times a day (QID) | ORAL | Status: DC | PRN
  Administered 2022-02-05 – 2022-02-13 (×8): 650 mg via ORAL
  Filled 2022-02-05 (×9): qty 2

## 2022-02-05 MED ORDER — ONDANSETRON HCL 4 MG/2ML IJ SOLN
4.0000 mg | Freq: Once | INTRAMUSCULAR | Status: AC
  Administered 2022-02-05: 4 mg via INTRAVENOUS
  Filled 2022-02-05: qty 2

## 2022-02-05 MED ORDER — IPRATROPIUM-ALBUTEROL 0.5-2.5 (3) MG/3ML IN SOLN
3.0000 mL | Freq: Three times a day (TID) | RESPIRATORY_TRACT | Status: DC
  Administered 2022-02-05 – 2022-02-06 (×4): 3 mL via RESPIRATORY_TRACT
  Filled 2022-02-05 (×3): qty 3

## 2022-02-05 MED ORDER — PANTOPRAZOLE SODIUM 40 MG IV SOLR
40.0000 mg | Freq: Two times a day (BID) | INTRAVENOUS | Status: DC
  Administered 2022-02-05 – 2022-02-13 (×17): 40 mg via INTRAVENOUS
  Filled 2022-02-05 (×17): qty 10

## 2022-02-05 MED ORDER — ENOXAPARIN SODIUM 40 MG/0.4ML IJ SOSY
40.0000 mg | PREFILLED_SYRINGE | INTRAMUSCULAR | Status: DC
  Administered 2022-02-05 – 2022-02-13 (×9): 40 mg via SUBCUTANEOUS
  Filled 2022-02-05 (×9): qty 0.4

## 2022-02-05 MED ORDER — ONDANSETRON HCL 4 MG/2ML IJ SOLN
4.0000 mg | Freq: Four times a day (QID) | INTRAMUSCULAR | Status: DC
  Administered 2022-02-05 – 2022-02-14 (×36): 4 mg via INTRAVENOUS
  Filled 2022-02-05 (×35): qty 2

## 2022-02-05 MED ORDER — LACTATED RINGERS IV SOLN: INTRAVENOUS | Status: DC

## 2022-02-05 NOTE — Hospital Course (Signed)
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  The patient denies any new medications.  She denies any alcohol or illicit substances.  She has not been taking any NSAIDs.  She states that she has not been able to tolerate any of her oral medications because of her emesis.  She has not had any fevers, chills, chest pain, hematemesis, hematochezia, melena.  She states that her breathing and coughing are the same as usual.  She denies any worsening lower extremity edema.  She denies any recent sick contacts or travels.  She has had some loose stools.  Notably, the patient has been following Dr. Mansouraty for her chronic pancreatitis.  She last saw him on 01/03/2022.  At that visit it was felt best to defer elective EUS secondary to her chronic respiratory failure and risks involved with conscious sedation.  Plans were to obtain a CT of the pancreas with pancreatic protocol.  She was restarted on pancreatic enzyme supplementation.  In the ED, the patient had low-grade temperature of 99.3 F.  She was mildly tachycardic in 110 range and hemodynamically stable.  Oxygen saturation was 100% on 6 L.  WBC 4.8, hemoglobin 12.0, platelets 227,000.  Sodium 141, potassium 3.8, bicarbonate 28, serum creatinine 0.58.  AST 18, ALT 13, alk phosphatase 111, total bilirubin 1.0.  Troponin 8>>9.  EKG shows sinus rhythm with nonspecific ST changes.  The patient was started on IV fluids and IV opioids for her intractable nausea and vomiting.  She was admitted for further workup and treatment. 

## 2022-02-05 NOTE — H&P (Signed)
History and Physical    Patient: Jasmine [name] KUV:[id number] DOB: [birth year] DOA: 02/04/2022 DOS: the patient was seen and examined on 02/05/2022 PCP: Janora Norlander, DO  Patient coming from: Home  Chief Complaint: No chief complaint on file.  HPI: [name] is a 66 year old female with history of COPD, chronic respiratory failure on 6-7 L, tobacco abuse, opioid dependence secondary to chronic back pain, hypertension, hyperlipidemia,nephrolithiasis, colon polyps (TA's), GERD, idiopathic pancreatitis, abnormal pancreas imaging with dilated duct, chronically dilated bile duct, presents with a 4-day history of abdominal pain more so in her lower abdomen with associated nausea and vomiting.  The patient denies any new medications.  She denies any alcohol or illicit substances.  She has not been taking any NSAIDs.  She states that she has not been able to tolerate any of her oral medications because of her emesis.  She has not had any fevers, chills, chest pain, hematemesis, hematochezia, melena.  She states that her breathing and coughing are the same as usual.  She denies any worsening lower extremity edema.  She denies any recent sick contacts or travels.  She has had some loose stools.  Notably, the patient has been following Dr. Rush Landmark for her chronic pancreatitis.  She last saw him on 01/03/2022.  At that visit it was felt best to defer elective EUS secondary to her chronic respiratory failure and risks involved with conscious sedation.  Plans were to obtain a CT of the pancreas with pancreatic protocol.  She was restarted on pancreatic enzyme supplementation.  In the ED, the patient had low-grade temperature of 99.3 F.  She was mildly tachycardic in 110 range and hemodynamically stable.  Oxygen saturation was 100% on 6 L.  WBC 4.8, hemoglobin 12.0, platelets 227,000.  Sodium 141, potassium 3.8, bicarbonate 28, serum creatinine 0.58.  AST 18, ALT 13, alk phosphatase 111, total  bilirubin 1.0.  Troponin 8>>9.  EKG shows sinus rhythm with nonspecific ST changes.  The patient was started on IV fluids and IV opioids for her intractable nausea and vomiting.  She was admitted for further workup and treatment.  Review of Systems: As mentioned in the history of present illness. All other systems reviewed and are negative. Past Medical History:  Diagnosis Date   Allergy    Anemia    Anxiety    Arthritis    Asthma    Carpal tunnel syndrome    Chronic back pain    Cigarette smoker 10/28/1896   Complication of anesthesia    in the past has had N/V, the last few surgeries she has not been   COPD (chronic obstructive pulmonary disease) ([place])    Easy bruising 07/10/2016   GERD (gastroesophageal reflux disease)    Headache    Heart murmur 2005   never had any problems   History of blood transfusion 2018   History of kidney stones    Hypertension    Hypoglycemia    Hypoglycemia    Iron deficiency anemia 07/11/2016   Neuropathy    both arms   Normocytic anemia 07/29/2015   Pancreatitis    Pneumonia    PONV (postoperative nausea and vomiting)    Postoperative anemia due to acute blood loss 11/15/2015   R lung cancer    Sciatica    Seizures ([place])    as a child when she would have an asthma attack. none as an adult   Shortness of breath dyspnea    Past Surgical History:  Procedure Laterality  Date   ABDOMINAL HYSTERECTOMY  3557   APPLICATION OF ROBOTIC ASSISTANCE FOR SPINAL PROCEDURE N/A 09/05/2016   Procedure: APPLICATION OF ROBOTIC ASSISTANCE FOR SPINAL PROCEDURE;  Surgeon: Consuella Lose, MD;  Location: [facility];  Service: Neurosurgery;  Laterality: N/A;   BACK SURGERY     BIOPSY  06/14/2020   Procedure: BIOPSY;  Surgeon: Daneil Dolin, MD;  [facility];  Service: Endoscopy;;   [place]   COLONOSCOPY     unsure last   COLONOSCOPY WITH PROPOFOL N/A 06/14/2020   Procedure: COLONOSCOPY WITH PROPOFOL;  Surgeon: Daneil Dolin, MD;  [facility];  Service: Endoscopy;  Laterality: N/A;  PM   cyst removal face     disk repair     neck and lumbar region   ESOPHAGOGASTRODUODENOSCOPY (EGD) WITH PROPOFOL N/A 06/14/2020   Procedure: ESOPHAGOGASTRODUODENOSCOPY (EGD) WITH PROPOFOL;  Surgeon: Daneil Dolin, MD;  [facility];  Service: Endoscopy;  Laterality: N/A;   FOOT SURGERY Bilateral    to file bones down    HARDWARE REMOVAL N/A 09/05/2016   Procedure: HARDWARE REMOVAL AND REPLACEMENT OF LUMBAR FIVE SCREWS;  Surgeon: Consuella Lose, MD;  Location: [facility];  Service: Neurosurgery;  Laterality: N/A;   IMPLANTATION / PLACEMENT EPIDURAL NEUROSTIMULATOR ELECTRODES     INTERCOSTAL NERVE BLOCK Right 07/29/2020   Procedure: INTERCOSTAL NERVE BLOCK;  Surgeon: Melrose Nakayama, MD;  Location: [facility];  Service: Thoracic;  Laterality: Right;   LAMINECTOMY     2006   lobectomy Right 07/2020   LUMBAR FUSION     LYMPH NODE BIOPSY Right 07/29/2020   Procedure: LYMPH NODE BIOPSY;  Surgeon: Melrose Nakayama, MD;  Location: [facility];  Service: Thoracic;  Laterality: Right;   POLYPECTOMY  06/14/2020   Procedure: POLYPECTOMY INTESTINAL;  Surgeon: Daneil Dolin, MD;  [facility];  Service: Endoscopy;;   SPINAL CORD STIMULATOR BATTERY EXCHANGE     SPINAL CORD STIMULATOR INSERTION N/A 07/05/2016   Procedure: REPLACEMENT OF LUMBAR SPINAL CORD STIMULATOR BATTERY;  Surgeon: Newman Pies, MD;  Location: [facility];  Service: Neurosurgery;  Laterality: N/A;   TONSILLECTOMY     Social History:  reports that she has been smoking cigarettes. She has a 8.75 pack-year smoking history. She has never used smokeless tobacco. She reports current alcohol use of about 2.0 standard drinks of alcohol per week. She reports that she does not use drugs.  Allergies  Allergen Reactions   Lyrica [Pregabalin] Other (See Comments)    EXTREME SHAKING/TREMBLING   Darvon [Propoxyphene]     UNSPECIFIED REACTION     Gabapentin     Extreme shaking and trembling    Augmentin [Amoxicillin-Pot Clavulanate] Nausea And Vomiting    Has patient had a PCN reaction causing immediate rash, facial/tongue/throat swelling, SOB or lightheadedness with hypotension:no Has patient had a PCN reaction causing severe rash involving mucus membranes or skin necrosis:No Has patient had a PCN reaction that required hospitalization:No Has patient had a PCN reaction occurring within the last 10 years:Yes--ONLY N/V If all of the above answers are "NO", then may proceed with Cephalosporin use.    Erythromycin Itching and Rash        Vibramycin [Doxycycline Calcium] Itching and Rash    "[place] "    Family History  Problem Relation Age of Onset   Heart disease Mother    Diabetes Mother    Hypertension Mother  Hyperlipidemia Mother    COPD Mother        smoked   Cancer Mother        bile duct   COPD Father    Diabetes Father    Heart disease Father    Cancer Father        throat   Dementia Father    Hypertension Sister    Hyperlipidemia Sister    Hypertension Brother    Hyperlipidemia Brother    Hypertension Brother    Hyperlipidemia Brother    Emphysema Maternal Grandmother        smoked   Asthma Son    Colon cancer Neg Hx    Colon polyps Neg Hx    Pancreatic cancer Neg Hx    Esophageal cancer Neg Hx    Inflammatory bowel disease Neg Hx    Liver disease Neg Hx    Rectal cancer Neg Hx    Stomach cancer Neg Hx     Prior to Admission medications   Medication Sig Start Date End Date Taking? Authorizing Provider  albuterol (VENTOLIN HFA) 108 (90 Base) MCG/ACT inhaler 2puffs up to every 4 hours if can't catch your breath 01/11/22   Tanda Rockers, MD  amLODipine (NORVASC) 10 MG tablet Take 1 tablet (10 mg total) by mouth daily. 09/26/21   Janora Norlander, DO  atorvastatin (LIPITOR) 40 MG tablet Take 1 tablet (40 mg total) by mouth daily. 07/09/20   Janora Norlander, DO  bisoprolol (ZEBETA) 5 MG  tablet Take 0.5 tablets (2.5 mg total) by mouth daily. Take in Place of Metoprolol 08/07/21   Roxan Hockey, MD  Budeson-Glycopyrrol-Formoterol (BREZTRI AEROSPHERE) 160-9-4.8 MCG/ACT AERO Inhale 2 puffs into the lungs in the morning and at bedtime. 10/26/21   Tanda Rockers, MD  budesonide (PULMICORT) 0.25 MG/2ML nebulizer solution Take 2 mLs (0.25 mg total) by nebulization in the morning and at bedtime. 01/11/22   Tanda Rockers, MD  dextromethorphan (DELSYM) 30 MG/5ML liquid Take 15 mg by mouth as needed for cough.    [provider]  diclofenac Sodium (VOLTAREN) 1 % GEL Apply 2 g topically 4 (four) times daily. 09/05/21   [provider]  formoterol (PERFOROMIST) 20 MCG/2ML nebulizer solution Take 2 mLs (20 mcg total) by nebulization 2 (two) times daily. Use in nebulizer twice daily perfectly regularly 01/11/22   Tanda Rockers, MD  magic mouthwash SOLN Take 5 mLs by mouth 3 (three) times daily as needed for mouth pain. 10/26/21   Janora Norlander, DO  mirabegron ER (MYRBETRIQ) 25 MG TB24 tablet Take 1 tablet (25 mg total) by mouth daily. 10/26/21   Janora Norlander, DO  mirtazapine (REMERON) 15 MG tablet Take 1 tablet (15 mg total) by mouth at bedtime. 09/26/21   Janora Norlander, DO  Nutritional Supplements (ENSURE HIGH PROTEIN) LIQD Drink 1 can 3 times daily. 08/30/20   Janora Norlander, DO  omeprazole (PRILOSEC) 40 MG capsule Take 1 capsule (40 mg total) by mouth 2 (two) times daily with a meal. 09/28/21   Ronnie Doss M, DO  oxyCODONE-acetaminophen (PERCOCET) 10-325 MG tablet Take 1 tablet by mouth 5 (five) times daily. 08/27/20   [provider]  OXYCONTIN 15 MG 12 hr tablet Take 15 mg by mouth every 12 (twelve) hours. 08/28/20   [provider]  OXYGEN Inhale 4 L into the lungs See admin instructions. continuous    [provider]  Pancrelipase, Lip-Prot-Amyl, 24000-76000 units CPEP Take 2  caps daily with meals and 1 cap with snacks  01/03/22   Mansouraty, Telford Nab., MD  predniSONE (DELTASONE) 10 MG tablet Take by mouth. 12/27/21   [provider]  prochlorperazine (COMPAZINE) 25 MG suppository Place 1 suppository (25 mg total) rectally every 12 (twelve) hours as needed for nausea or vomiting. 08/07/21   Roxan Hockey, MD  revefenacin (YUPELRI) 175 MCG/3ML nebulizer solution Inhale 3 mLs (175 mcg total) into the lungs daily. 01/11/22   Tanda Rockers, MD  tiZANidine (ZANAFLEX) 4 MG capsule Take 1 capsule (4 mg total) by mouth 3 (three) times daily as needed for muscle spasms. 09/13/21   Barton Dubois, MD  traZODone (DESYREL) 150 MG tablet TAKE 1 TO 2 TABLETS AT BEDTIME Patient taking differently: Take 225 mg by mouth at bedtime. 03/30/21   Janora Norlander, DO    Physical Exam: Vitals:   02/05/22 0400 02/05/22 0430 02/05/22 0500 02/05/22 0530  BP: (!) 186/87 (!) 193/88 (!) 198/99 (!) 174/90  Pulse: 85 94  92  Resp:    20  Temp:    98.7 F (37.1 C)  TempSrc:    Oral  SpO2: 100% 100%  100%  Weight:      Height:       GENERAL:  A&O x 3, NAD, well developed, cooperative, follows commands HEENT: [place]/AT, No thrush, No icterus, No oral ulcers Neck:  No neck mass, No meningismus, soft, supple CV: RRR, no S3, no S4, no rub, no JVD Lungs:  diminished BS.  Bibasilar rales. No wheeze, good air movement Abd: soft/LLQ, RLQ pain +BS, nondistended Ext: No edema, no lymphangitis, no cyanosis, no rashes Neuro:  CN II-XII intact, strength 4/5 in RUE, RLE, strength 4/5 LUE, LLE; sensation intact bilateral; no dysmetria; babinski equivocal  Data Reviewed: Data reviewed above in history  Assessment and Plan: Intractable nausea and vomiting -Concerns for nonspecific viral gastroenteritis versus acute on chronic pancreatitis -Remain n.p.o. except for medications -IV fluids -Continue IV hydromorphone until able to tolerate p.o. -Start pantoprazole twice daily -Around-the-clock antiemetics  Chronic pancreatitis with  chronic biliary ductal dilatation -02/04/2022 CT abd/pelvis--diffuse pancreatic atrophy with subcentimeter parenchymal calcifications in the pancreatic head.  No bowel wall thickening.  Stable CBD and pancreatic ductal dilatation. -Patient follows Dr. Rush Landmark -Restart Creon when able to tolerate po  Chronic respiratory failure with hypoxia -She is chronically on 6 to 7 L -Stable presently  Opioid dependence -PDMP reviewed -Patient receives OxyContin 15 mg, #60 monthly -Patient receives Percocet 10/325 #150 monthly -No red flags  COPD -Stable presently without exacerbation -Continue bronchodilators--Dulera and Spiriva  Tobacco abuse -Tobacco cessation discussed  Essential hypertension -Restart amlodipine and Zebeta when able to tolerate p.o. -IV hydralazine prn SBP >180  Hyperlipidemia -Restart statin when able to tolerate p.o.   Advance Care Planning: FULL  Consults: none  Family Communication: none  Severity of Illness: The appropriate patient status for this patient is INPATIENT. Inpatient status is judged to be reasonable and necessary in order to provide the required intensity of service to ensure the patient's safety. The patient's presenting symptoms, physical exam findings, and initial radiographic and laboratory data in the context of their chronic comorbidities is felt to place them at high risk for further clinical deterioration. Furthermore, it is not anticipated that the patient will be medically stable for discharge from the hospital within 2 midnights of admission.   * I certify that at the point of admission it is my clinical judgment that the patient will require inpatient  hospital care spanning beyond 2 midnights from the point of admission due to high intensity of service, high risk for further deterioration and high frequency of surveillance required.*  Author: Orson Eva, MD 02/05/2022 7:27 AM  For on call review [email or web address].

## 2022-02-06 DIAGNOSIS — R112 Nausea with vomiting, unspecified: Secondary | ICD-10-CM | POA: Diagnosis not present

## 2022-02-06 DIAGNOSIS — J9611 Chronic respiratory failure with hypoxia: Secondary | ICD-10-CM | POA: Diagnosis not present

## 2022-02-06 DIAGNOSIS — J432 Centrilobular emphysema: Secondary | ICD-10-CM | POA: Diagnosis not present

## 2022-02-06 DIAGNOSIS — K861 Other chronic pancreatitis: Secondary | ICD-10-CM | POA: Diagnosis not present

## 2022-02-06 DIAGNOSIS — K8689 Other specified diseases of pancreas: Secondary | ICD-10-CM | POA: Diagnosis not present

## 2022-02-06 DIAGNOSIS — K859 Acute pancreatitis without necrosis or infection, unspecified: Secondary | ICD-10-CM | POA: Diagnosis not present

## 2022-02-06 LAB — COMPREHENSIVE METABOLIC PANEL
ALT: 11 U/L (ref 0–44)
AST: 19 U/L (ref 15–41)
Albumin: 3.2 g/dL — ABNORMAL LOW (ref 3.5–5.0)
Alkaline Phosphatase: 98 U/L (ref 38–126)
Anion gap: 13 (ref 5–15)
BUN: 8 mg/dL (ref 8–23)
CO2: 25 mmol/L (ref 22–32)
Calcium: 8.7 mg/dL — ABNORMAL LOW (ref 8.9–10.3)
Chloride: 97 mmol/L — ABNORMAL LOW (ref 98–111)
Creatinine, Ser: 0.64 mg/dL (ref 0.44–1.00)
GFR, Estimated: >60 mL/min (ref >60)
Glucose, Bld: 67 mg/dL — ABNORMAL LOW (ref 70–99)
Potassium: 3.1 mmol/L — ABNORMAL LOW (ref 3.5–5.1)
Sodium: 135 mmol/L (ref 135–145)
Total Bilirubin: 1 mg/dL (ref 0.3–1.2)
Total Protein: 6.3 g/dL — ABNORMAL LOW (ref 6.5–8.1)

## 2022-02-06 LAB — CBC
HCT: 36.1 % (ref 36.0–46.0)
Hemoglobin: 10.9 g/dL — ABNORMAL LOW (ref 12.0–15.0)
MCH: 32.8 pg (ref 26.0–34.0)
MCHC: 30.2 g/dL (ref 30.0–36.0)
MCV: 108.7 fL — ABNORMAL HIGH (ref 80.0–100.0)
Platelets: 222 10*3/uL (ref 150–400)
RBC: 3.32 MIL/uL — ABNORMAL LOW (ref 3.87–5.11)
RDW: 14.4 % (ref 11.5–15.5)
WBC: 4.9 10*3/uL (ref 4.0–10.5)
nRBC: 0 % (ref 0.0–0.2)

## 2022-02-06 MED ORDER — METOCLOPRAMIDE HCL 5 MG/ML IJ SOLN
10.0000 mg | Freq: Once | INTRAMUSCULAR | Status: AC
  Administered 2022-02-06: 10 mg via INTRAVENOUS
  Filled 2022-02-06: qty 2

## 2022-02-06 MED ORDER — MORPHINE SULFATE (PF) 4 MG/ML IV SOLN
4.0000 mg | INTRAVENOUS | Status: DC | PRN
  Administered 2022-02-06 – 2022-02-10 (×28): 4 mg via INTRAVENOUS
  Filled 2022-02-06 (×28): qty 1

## 2022-02-06 MED ORDER — ALBUTEROL SULFATE (2.5 MG/3ML) 0.083% IN NEBU
2.5000 mg | INHALATION_SOLUTION | Freq: Three times a day (TID) | RESPIRATORY_TRACT | Status: DC
  Administered 2022-02-06 – 2022-02-07 (×2): 2.5 mg via RESPIRATORY_TRACT
  Filled 2022-02-06 (×2): qty 3

## 2022-02-06 MED ORDER — REVEFENACIN 175 MCG/3ML IN SOLN
175.0000 ug | Freq: Every day | RESPIRATORY_TRACT | Status: DC
  Administered 2022-02-07 – 2022-02-14 (×8): 175 ug via RESPIRATORY_TRACT
  Filled 2022-02-06 (×9): qty 3

## 2022-02-06 MED ORDER — HYDRALAZINE HCL 20 MG/ML IJ SOLN: 10.0000 mg | Freq: Four times a day (QID) | INTRAMUSCULAR | Status: DC | PRN

## 2022-02-06 MED ORDER — PROMETHAZINE HCL 25 MG/ML IJ SOLN
INTRAMUSCULAR | Status: AC
  Filled 2022-02-06: qty 1

## 2022-02-06 MED ORDER — AMLODIPINE BESYLATE 5 MG PO TABS
10.0000 mg | ORAL_TABLET | Freq: Every day | ORAL | Status: DC
  Administered 2022-02-06 – 2022-02-14 (×9): 10 mg via ORAL
  Filled 2022-02-06 (×10): qty 2

## 2022-02-06 MED ORDER — BISOPROLOL FUMARATE 5 MG PO TABS
2.5000 mg | ORAL_TABLET | Freq: Every day | ORAL | Status: DC
  Administered 2022-02-06 – 2022-02-14 (×9): 2.5 mg via ORAL
  Filled 2022-02-06 (×9): qty 1

## 2022-02-06 MED ORDER — DIPHENHYDRAMINE HCL 50 MG/ML IJ SOLN
12.5000 mg | Freq: Once | INTRAMUSCULAR | Status: AC
  Administered 2022-02-06: 12.5 mg via INTRAVENOUS
  Filled 2022-02-06: qty 1

## 2022-02-06 MED ORDER — MELATONIN 3 MG PO TABS
6.0000 mg | ORAL_TABLET | Freq: Once | ORAL | Status: AC
  Administered 2022-02-06: 6 mg via ORAL
  Filled 2022-02-06: qty 2

## 2022-02-06 MED ORDER — TRAZODONE HCL 50 MG PO TABS
300.0000 mg | ORAL_TABLET | Freq: Every day | ORAL | Status: DC
  Administered 2022-02-06 – 2022-02-13 (×8): 300 mg via ORAL
  Filled 2022-02-06 (×8): qty 6

## 2022-02-06 NOTE — Progress Notes (Signed)
Patient has continuously complained of abdominal pain and nausea throughout the day, reglan was given as a one time dose and has not had any vomiting since. Per patients request pain medication was switched from dilaudid to morphine as she felt it would work better but she is still complaining of break through pain before her q3 hour dose is due. Patients BP elevated and oral home hypertension meds given, MD Tat notified of BP reading.

## 2022-02-06 NOTE — TOC Initial Note (Signed)
Transition of Care [facility]) - Initial/Assessment Note    Patient Details  Name: Jasmine [name] MRN: [id number] Date of Birth: Feb 02, 1956  [facility]) CM/SW Contact:    Ihor Gully, LCSW Phone Number: 02/06/2022, 2:51 PM  Clinical Narrative:                 Patient from home with spouse. Admitted for nausea/emesis.  Considered high risk for readmission. Uses a walker. Has a cane, wc, bsc, shower stool, all bathrooms in her home have handicap guardrails. On home oxygen, 6L, provided by [facility]. Maintains medical appointments, husband transports.   Expected Discharge Plan: Home/Self Care Barriers to Discharge: Continued Medical Work up   Patient Goals and CMS Choice Patient states their goals for this hospitalization and ongoing recovery are:: return home      Expected Discharge Plan and Services Expected Discharge Plan: Home/Self Care       Living arrangements for the past 2 months: Single Family Home                                      Prior Living Arrangements/Services Living arrangements for the past 2 months: Single Family Home Lives with:: Spouse Patient language and need for interpreter reviewed:: Yes        Need for Family Participation in Patient Care: Yes (Comment) Care giver support system in place?: Yes (comment) Current home services: DME Criminal Activity/Legal Involvement Pertinent to Current Situation/Hospitalization: No - Comment as needed  Activities of Daily Living Home Assistive Devices/Equipment: Environmental consultant (specify type), Wheelchair, Oxygen, Shower chair with back, Cane (specify quad or straight) ADL Screening (condition at time of admission) Patient's cognitive ability adequate to safely complete daily activities?: Yes Is the patient deaf or have difficulty hearing?: No Does the patient have difficulty seeing, even when wearing glasses/contacts?: No Does the patient have difficulty concentrating, remembering, or making  decisions?: No Patient able to express need for assistance with ADLs?: Yes Does the patient have difficulty dressing or bathing?: No Independently performs ADLs?: Yes (appropriate for developmental age) Does the patient have difficulty walking or climbing stairs?: Yes Weakness of Legs: Both Weakness of Arms/Hands: None  Permission Sought/Granted                  Emotional Assessment     Affect (typically observed): Appropriate Orientation: : Oriented to Self, Oriented to Place, Oriented to  Time, Oriented to Situation Alcohol / Substance Use: Not Applicable Psych Involvement: No (comment)  Admission diagnosis:  Chronic pancreatitis, unspecified pancreatitis type ([place]) [K86.1] Acute on chronic pancreatitis ([place]) [K85.90, K86.1] Patient Active Problem List   Diagnosis Date Noted   Opioid dependence ([place]) 02/05/2022   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   Dilation of pancreatic duct 01/06/2022   Common bile duct dilation 01/06/2022   COPD exacerbation ([place]) 09/26/2021   Sepsis ([place]) 09/23/2021   Chronic pancreatitis ([place]) 09/09/2021   Hyperlipidemia 09/09/2021   Community acquired pneumonia 08/04/2021   PNA (pneumonia) 08/03/2021   Abdominal pain 06/29/2021   Chronic pain syndrome 03/13/2021   Pressure injury of skin 03/12/2021   Acute on chronic pancreatitis ([place]) 03/12/2021   Intractable abdominal pain 03/11/2021   Intractable pain 02/12/2021   Squamous cell lung cancer, right ([place]) 01/13/2021   Atypical chest pain 11/26/2020   Elevated MCV 11/26/2020   Intractable nausea and vomiting 11/26/2020   History of Clostridioides difficile infection 11/26/2020  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[place]) 10/11/2020   Gastroenteritis 10/11/2020   Acute gastroenteritis 10/10/2020   Protein-calorie malnutrition, severe 08/26/2020   Chest pain 08/25/2020   Pleuritic chest pain 08/25/2020   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   GERD (gastroesophageal reflux disease) 04/22/2020    Abnormal CT of the abdomen 04/22/2020   Preventative health care 04/22/2020   Solitary pulmonary nodule 03/22/2020   Pulmonary infiltrate on chest x-ray 02/24/2020   Nocturnal cough 02/19/2020   Pancreatitis, acute 01/06/2019   Acute pancreatitis 01/05/2019   Cigarette smoker 08/25/2017   Chronic respiratory failure with hypoxia ([place]) 08/25/2017   Centrilobular emphysema ([place]) 07/09/2017   Aortic atherosclerosis ([place]) 07/09/2017   Hepatic steatosis 07/09/2017   Malnutrition of moderate degree 07/02/2017   Acute respiratory failure with hypoxia ([place]) 06/30/2017   Hypokalemia 06/30/2017   COPD with acute exacerbation ([place]) 06/30/2017   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/2016   Tobacco abuse 08/04/2016   Iron deficiency anemia 07/11/2016   Easy bruising 07/10/2016   Lumbar stenosis 11/11/2015   Leg pain 09/24/2015   Vitamin D deficiency 07/29/2015   Chronic back pain 02/22/2015   HTN (hypertension) 02/22/2015   Chronic narcotic use 02/22/2015   COPD GOLD II  02/22/2015   Headache 02/22/2015   PCP:  Janora Norlander, DO Pharmacy:   [facility], [place] - [place] [address] [place] [address] Phone: [phone] Fax: [phone]     Social Determinants of Health (SDOH) Interventions    Readmission Risk Interventions    09/28/2021   11:35 AM 08/05/2021   11:27 AM 07/01/2021    1:45 PM  Readmission Risk Prevention Plan  Transportation Screening Complete Complete Complete  Medication Review (RN Care Manager) Complete Complete   PCP or Specialist appointment within 3-5 days of discharge   Not Complete  HRI or Home Care Consult Patient refused Patient refused Complete  SW Recovery Care/Counseling Consult Complete Complete Complete  Palliative Care Screening Not Applicable Not Applicable Not [place] Not Applicable Not Applicable Not Applicable

## 2022-02-06 NOTE — Consult Note (Signed)
Gastroenterology Consult   Referring Provider: Dr. Carles Collet  Primary Care Physician:  Janora Norlander, DO Primary Gastroenterologist:  Dr. Gala Romney   Patient ID: Jasmine [name]; [id number]; 25-Dec-1955   Admit date: 02/04/2022  LOS: 1 day   Date of Consultation: 02/06/2022  Reason for Consultation:  Intractable vomiting   History of Present Illness   Jasmine [name] is a 66 y.o. 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 Presenting to the ED with abdominal pain, N/V.   In ED: CT abd/pelvis with contrast with findings consistent with chronic pancreatitis. Stable CBD and pancreatic ductal dilatation. LFTs normal. Lipase normal. Hgb 12.0. urinalysis unrevealing.   Abdominal pain, N/V acute onset on Wednesday. Lower abdominal pain noted along with upper abdomen. Persistent. N/V worsened to the point couldn't tolerate liquids. No dysphagia. Feels globus sensation. Omeprazole BID. Just received pain meds an hour ago with some improvement in lower abdominal pain. Feels like morphine helps more than dilaudid. Has overactive bladder. Feels painful with urinating. Occasional alcohol but was one glass of wine last weekend. Less than 5 cigarettes per day.   EUS had been discussed at visit with Dr. Rush Landmark recently; however, it was felt this would carry increased risks due to O2 requirements. Suspected chronic CBD dilatation related to chronic opioids. Still with unclear etiology of chronic pancreatitis and pancreatic stricture. Recommendations for CT with pancreatic protocol within next 3 months. No constipation. No diarrhea. Soft stools. Vomited three times this morning. Has been NPO.    Last EGD and  Colonoscopy 06/14/20.  EGD with normal esophagus, erosive gastropathy predominantly antrum (hyperemia and mild reactive changes), marked erythema, patent pylorus, normal duodenum.  Colonoscopy with four 6-9 mm polyps at splenic flexure and hepatic flexure, single 20 mm polyp in the hepatic flexure, otherwise normal, pathology consistent with tubular adenomas. Due for repeat colonoscopy 2025.   Past Medical History:  Diagnosis Date   Allergy    Anemia    Anxiety    Arthritis    Asthma    Carpal tunnel syndrome    Chronic back pain    Cigarette smoker 04/19/863   Complication of anesthesia    in the past has had N/V, the last few surgeries she has not been   COPD (chronic obstructive pulmonary disease) ([place])    Easy bruising 07/10/2016   GERD (gastroesophageal reflux disease)    Headache    Heart murmur 2005   never had any problems   History of blood transfusion 2018   History of kidney stones    Hypertension    Hypoglycemia    Hypoglycemia    Iron deficiency anemia 07/11/2016   Neuropathy    both arms   Normocytic anemia 07/29/2015   Pancreatitis    Pneumonia    PONV (postoperative nausea and vomiting)    Postoperative anemia due to acute blood loss 11/15/2015   R lung cancer    Sciatica    Seizures ([place])    as a child when she would have an asthma attack. none as an adult   Shortness of breath dyspnea     Past Surgical History:  Procedure Laterality Date   ABDOMINAL HYSTERECTOMY  7846   APPLICATION OF ROBOTIC ASSISTANCE FOR SPINAL PROCEDURE N/A 09/05/2016  Procedure: APPLICATION OF ROBOTIC ASSISTANCE FOR SPINAL PROCEDURE;  Surgeon: Consuella Lose, MD;  Location: [facility];  Service: Neurosurgery;  Laterality: N/A;   BACK SURGERY     BIOPSY  06/14/2020   Procedure: BIOPSY;  Surgeon: Daneil Dolin, MD;  [facility];  Service: Endoscopy;;   [place]   COLONOSCOPY     unsure last   COLONOSCOPY WITH PROPOFOL N/A 06/14/2020    Procedure: COLONOSCOPY WITH PROPOFOL;  Surgeon: Daneil Dolin, MD;  [facility];  Service: Endoscopy;  Laterality: N/A;  PM   cyst removal face     disk repair     neck and lumbar region   ESOPHAGOGASTRODUODENOSCOPY (EGD) WITH PROPOFOL N/A 06/14/2020   Procedure: ESOPHAGOGASTRODUODENOSCOPY (EGD) WITH PROPOFOL;  Surgeon: Daneil Dolin, MD;  [facility];  Service: Endoscopy;  Laterality: N/A;   FOOT SURGERY Bilateral    to file bones down    HARDWARE REMOVAL N/A 09/05/2016   Procedure: HARDWARE REMOVAL AND REPLACEMENT OF LUMBAR FIVE SCREWS;  Surgeon: Consuella Lose, MD;  Location: [facility];  Service: Neurosurgery;  Laterality: N/A;   IMPLANTATION / PLACEMENT EPIDURAL NEUROSTIMULATOR ELECTRODES     INTERCOSTAL NERVE BLOCK Right 07/29/2020   Procedure: INTERCOSTAL NERVE BLOCK;  Surgeon: Melrose Nakayama, MD;  Location: [facility];  Service: Thoracic;  Laterality: Right;   LAMINECTOMY     2006   lobectomy Right 07/2020   LUMBAR FUSION     LYMPH NODE BIOPSY Right 07/29/2020   Procedure: LYMPH NODE BIOPSY;  Surgeon: Melrose Nakayama, MD;  Location: [facility];  Service: Thoracic;  Laterality: Right;   POLYPECTOMY  06/14/2020   Procedure: POLYPECTOMY INTESTINAL;  Surgeon: Daneil Dolin, MD;  [facility];  Service: Endoscopy;;   SPINAL CORD STIMULATOR BATTERY EXCHANGE     SPINAL CORD STIMULATOR INSERTION N/A 07/05/2016   Procedure: REPLACEMENT OF LUMBAR SPINAL CORD STIMULATOR BATTERY;  Surgeon: Newman Pies, MD;  Location: [facility];  Service: Neurosurgery;  Laterality: N/A;   TONSILLECTOMY      Prior to Admission medications   Medication Sig Start Date End Date Taking? Authorizing Provider  acetaminophen (TYLENOL) 500 MG tablet Take 1,000 mg by mouth every 6 (six) hours as needed for headache.   Yes [provider]  albuterol (VENTOLIN HFA) 108 (90 Base) MCG/ACT inhaler 2puffs up to every 4 hours if can't catch your breath 01/11/22  Yes Tanda Rockers, MD  amLODipine (NORVASC) 10 MG tablet Take 1 tablet (10 mg total) by mouth daily. 09/26/21  Yes Gottschalk, Leatrice Jewels M, DO  bisoprolol (ZEBETA) 5 MG tablet Take 0.5 tablets (2.5 mg total) by mouth daily. Take in Place of Metoprolol 08/07/21  Yes Emokpae, Courage, MD  budesonide (PULMICORT) 0.25 MG/2ML nebulizer solution Take 2 mLs (0.25 mg total) by nebulization in the morning and at bedtime. 01/11/22  Yes Tanda Rockers, MD  dextromethorphan (DELSYM) 30 MG/5ML liquid Take 15 mg by mouth as needed for cough.   Yes [provider]  diclofenac Sodium (VOLTAREN) 1 % GEL Apply 2 g topically 4 (four) times daily. 09/05/21  Yes [provider]  formoterol (PERFOROMIST) 20 MCG/2ML nebulizer solution Take 2 mLs (20 mcg total) by nebulization 2 (two) times daily. Use in nebulizer twice daily perfectly regularly 01/11/22  Yes Tanda Rockers, MD  Guaifenesin [facility] MAXIMUM STRENGTH) 1200 MG TB12 Take 1 tablet by mouth 2 (two) times daily.  Yes [provider]  magic mouthwash SOLN Take 5 mLs by mouth 3 (three) times daily as needed for mouth pain. 10/26/21  Yes Gottschalk, Leatrice Jewels M, DO  mirtazapine (REMERON) 15 MG tablet Take 1 tablet (15 mg total) by mouth at bedtime. 09/26/21  Yes Gottschalk, Leatrice Jewels M, DO  Nutritional Supplements (ENSURE HIGH PROTEIN) LIQD Drink 1 can 3 times daily. 08/30/20  Yes Ronnie Doss M, DO  omeprazole (PRILOSEC) 40 MG capsule Take 1 capsule (40 mg total) by mouth 2 (two) times daily with a meal. 09/28/21  Yes Gottschalk, Ashly M, DO  oxybutynin (DITROPAN-XL) 5 MG 24 hr tablet Take 5 mg by mouth daily. 12/12/21  Yes [provider]  oxyCODONE-acetaminophen (PERCOCET) 10-325 MG tablet Take 1 tablet by mouth 5 (five) times daily. 08/27/20  Yes [provider]  OXYCONTIN 15 MG 12 hr tablet Take 15 mg by mouth every 12 (twelve) hours. 08/28/20  Yes [provider]  OXYGEN Inhale 6-7 L into the lungs See admin instructions. continuous    Yes [provider]  Pancrelipase, Lip-Prot-Amyl, 24000-76000 units CPEP Take 2 caps daily with meals and 1 cap with snacks Patient taking differently: Take 2 capsules by mouth See admin instructions. Take 2 caps daily with meals and 1 cap with snacks 01/03/22  Yes Mansouraty, Telford Nab., MD  prochlorperazine (COMPAZINE) 25 MG suppository Place 1 suppository (25 mg total) rectally every 12 (twelve) hours as needed for nausea or vomiting. 08/07/21  Yes Emokpae, Courage, MD  revefenacin (YUPELRI) 175 MCG/3ML nebulizer solution Inhale 3 mLs (175 mcg total) into the lungs daily. 01/11/22  Yes Tanda Rockers, MD  tiZANidine (ZANAFLEX) 4 MG capsule Take 1 capsule (4 mg total) by mouth 3 (three) times daily as needed for muscle spasms. 09/13/21  Yes Barton Dubois, MD  traZODone (DESYREL) 150 MG tablet TAKE 1 TO 2 TABLETS AT BEDTIME Patient taking differently: Take 300 mg by mouth at bedtime. 03/30/21  Yes Gottschalk, Leatrice Jewels M, DO  Budeson-Glycopyrrol-Formoterol (BREZTRI AEROSPHERE) 160-9-4.8 MCG/ACT AERO Inhale 2 puffs into the lungs in the morning and at bedtime. Patient not taking: Reported on 02/05/2022 10/26/21   Tanda Rockers, MD  mirabegron ER (MYRBETRIQ) 25 MG TB24 tablet Take 1 tablet (25 mg total) by mouth daily. Patient not taking: Reported on 02/05/2022 10/26/21   Janora Norlander, DO  predniSONE (DELTASONE) 10 MG tablet Take by mouth. Patient not taking: Reported on 02/05/2022 12/27/21   [provider]    Current Facility-Administered Medications  Medication Dose Route Frequency Provider Last Rate Last Admin   acetaminophen (TYLENOL) tablet 650 mg  650 mg Oral Q6H PRN Tat, Shanon Brow, MD   650 mg at 02/05/22 1919   Or   acetaminophen (TYLENOL) suppository 650 mg  650 mg Rectal Q6H PRN Tat, Shanon Brow, MD       arformoterol (BROVANA) nebulizer solution 15 mcg  15 mcg Nebulization BID Tat, Shanon Brow, MD   15 mcg at 02/06/22 0706   budesonide (PULMICORT) nebulizer solution 0.5 mg  0.5 mg  Nebulization BID Tat, David, MD   0.5 mg at 02/06/22 0706   enoxaparin (LOVENOX) injection 40 mg  40 mg Subcutaneous Q24H Tat, Shanon Brow, MD   40 mg at 02/05/22 1439   HYDROmorphone (DILAUDID) injection 1 mg  1 mg Intravenous Q3H PRN Tat, Shanon Brow, MD   1 mg at 02/06/22 1059   influenza vaccine adjuvanted (FLUAD) injection 0.5 mL  0.5 mL Intramuscular Tomorrow-1000 Adefeso, Oladapo, DO  ipratropium-albuterol (DUONEB) 0.5-2.5 (3) MG/3ML nebulizer solution 3 mL  3 mL Nebulization Q8H Tat, Shanon Brow, MD   3 mL at 02/06/22 4496   lactated ringers infusion   Intravenous Continuous Tat, David, MD 125 mL/hr at 02/06/22 1026 Infusion Verify at 02/06/22 1026   ondansetron (ZOFRAN) injection 4 mg  4 mg Intravenous Q6H Orson Eva, MD   4 mg at 02/06/22 0636   pantoprazole (PROTONIX) injection 40 mg  40 mg Intravenous Therisa Doyne, MD   40 mg at 02/06/22 0800   promethazine (PHENERGAN) 12.5 mg in sodium chloride 0.9 % 50 mL IVPB  12.5 mg Intravenous Q6H PRN Orson Eva, MD   Stopped at 02/06/22 0631    Allergies as of 02/04/2022 - Review Complete 02/04/2022  Allergen Reaction Noted   Lyrica [pregabalin] Other (See Comments) 06/29/2016   Darvon [propoxyphene]  02/22/2015   Gabapentin  08/31/2016   Augmentin [amoxicillin-pot clavulanate] Nausea And Vomiting 03/19/2015   Erythromycin Itching and Rash 02/22/2015   Vibramycin [doxycycline calcium] Itching and Rash 02/22/2015    Family History  Problem Relation Age of Onset   Heart disease Mother    Diabetes Mother    Hypertension Mother    Hyperlipidemia Mother    COPD Mother        smoked   Cancer Mother        bile duct   COPD Father    Diabetes Father    Heart disease Father    Cancer Father        throat   Dementia Father    Hypertension Sister    Hyperlipidemia Sister    Hypertension Brother    Hyperlipidemia Brother    Hypertension Brother    Hyperlipidemia Brother    Emphysema Maternal Grandmother        smoked   Asthma Son    Colon  cancer Neg Hx    Colon polyps Neg Hx    Pancreatic cancer Neg Hx    Esophageal cancer Neg Hx    Inflammatory bowel disease Neg Hx    Liver disease Neg Hx    Rectal cancer Neg Hx    Stomach cancer Neg Hx     Social History   Socioeconomic History   Marital status: Married    Spouse name: [name]   Number of children: 1   Years of education: Not on file   Highest education level: Some college, no degree  Occupational History   Occupation: disability  Tobacco Use   Smoking status: Every Day    Packs/day: 0.25    Years: 35.00    Total pack years: 8.75    Types: Cigarettes   Smokeless tobacco: Never   Tobacco comments:    trying to quit  Vaping Use   Vaping Use: Never used  Substance and Sexual Activity   Alcohol use: Yes    Alcohol/week: 2.0 standard drinks of alcohol    Types: 2 Glasses of wine per week    Comment: occasionally   Drug use: No   Sexual activity: Yes    Birth control/protection: None  Other Topics Concern   Not on file  Social History Narrative   Retired, 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 1 son, deceased. 2 pet dogs. Enjoys watching TV.   Husband takes care of cooking, cleaning, driving, and helps her with ADLs including bathing   Social Determinants of Health   Financial Resource Strain: Low Risk  (04/01/2021)   Overall Financial Resource  Strain (CARDIA)    Difficulty of Paying Living Expenses: Not very hard  Food Insecurity: No Food Insecurity (02/05/2022)   Hunger Vital Sign    Worried About Running Out of Food in the Last Year: Never true    Ran Out of Food in the Last Year: Never true  Transportation Needs: No Transportation Needs (02/05/2022)   PRAPARE - Hydrologist (Medical): No    Lack of Transportation (Non-Medical): No  Physical Activity: Insufficiently Active (04/01/2021)   Exercise Vital Sign    Days of Exercise per Week: 7 days    Minutes of Exercise per Session: 10 min  Stress: No Stress  Concern Present (04/01/2021)   [place]    Feeling of Stress : Not at all  Social Connections: [place] (04/01/2021)   Social Connection and Isolation Panel [NHANES]    Frequency of Communication with Friends and Family: More than three times a week    Frequency of Social Gatherings with Friends and Family: More than three times a week    Attends Religious Services: 1 to 4 times per year    Active Member of [employer] or Organizations: Yes    Attends Archivist Meetings: 1 to 4 times per year    Marital Status: Married  Human resources officer Violence: Not At Risk (02/05/2022)   Humiliation, Afraid, Rape, and Kick questionnaire    Fear of Current or Ex-Partner: No    Emotionally Abused: No    Physically Abused: No    Sexually Abused: No     Review of Systems   Gen: Denies any fever, chills, loss of appetite, change in weight or weight loss CV: Denies chest pain, heart palpitations, syncope, edema  Resp: Denies shortness of breath with rest, cough, wheezing, coughing up blood, and pleurisy. GI: see HPI GU : Denies urinary burning, blood in urine, urinary frequency, and urinary incontinence. MS: Denies joint pain, limitation of movement, swelling, cramps, and atrophy.  Derm: Denies rash, itching, dry skin, hives. Psych: Denies depression, anxiety, memory loss, hallucinations, and confusion. Heme: Denies bruising or bleeding Neuro:  Denies any headaches, dizziness, paresthesias, shaking  Physical Exam   Vital Signs in last 24 hours: Temp:  [98.1 F (36.7 C)-99.4 F (37.4 C)] 98.1 F (36.7 C) (12/04 0532) Pulse Rate:  [79-109] 92 (12/04 0532) Resp:  [10-20] 20 (12/04 0532) BP: (127-194)/(67-94) 194/94 (12/04 0532) SpO2:  [97 %-100 %] 100 % (12/04 0706) Weight:  [49.4 kg] 49.4 kg (12/03 1924) Last BM Date : 02/03/22  General:   Alert,  Well-developed, uncomfortable, no distress, O2 via nasal cannula  6-7 liters  Head:  Normocephalic and atraumatic. Eyes:  Sclera clear, no icterus.    Ears:  Normal auditory acuity. Lungs:  Clear throughout to auscultation.    Heart:  S1 S2 present without murmurs Abdomen:  Soft, TTP upper abdomen and lower abdomen but nondistended. No masses, hepatosplenomegaly or hernias noted. Normal bowel sounds, without guarding, and without rebound.   Rectal: deferred   Msk:  Symmetrical without gross deformities. Normal posture. Extremities:  Without edema. Neurologic:  Alert and  oriented x4. Skin:  Intact without significant lesions or rashes. Psych:  Alert and cooperative. Normal mood and affect.  Intake/Output from previous day: 12/03 0701 - 12/04 0700 In: 1616.9 [I.V.:1566.9; IV Piggyback:50] Out: -  Intake/Output this shift: Total I/O In: 581.5 [I.V.:531.5; IV Piggyback:50] Out: -    Labs/Studies   Recent Labs  Recent Labs    02/04/22 2008 02/06/22 0507  WBC 4.8 4.9  HGB 12.0 10.9*  HCT 39.8 36.1  PLT 227 222   BMET Recent Labs    02/04/22 2008 02/06/22 0507  NA 141 135  K 3.8 3.1*  CL 101 97*  CO2 28 25  GLUCOSE 105* 67*  BUN 7* 8  CREATININE 0.58 0.64  CALCIUM 9.1 8.7*   LFT Recent Labs    02/04/22 2008 02/06/22 0507  PROT 7.3 6.3*  ALBUMIN 3.6 3.2*  AST 18 19  ALT 13 11  ALKPHOS 111 98  BILITOT 1.0 1.0    Radiology/Studies CT ABDOMEN PELVIS W CONTRAST  Result Date: 02/04/2022 CLINICAL DATA:  Abdominal pain with nausea and vomiting. EXAM: CT ABDOMEN AND PELVIS WITH CONTRAST TECHNIQUE: Multidetector CT imaging of the abdomen and pelvis was performed using the standard protocol following bolus administration of intravenous contrast. RADIATION DOSE REDUCTION: This exam was performed according to the departmental dose-optimization program which includes automated exposure control, adjustment of the mA and/or kV according to patient size and/or use of iterative reconstruction technique. CONTRAST:  126mL OMNIPAQUE IOHEXOL  300 MG/ML  SOLN COMPARISON:  November 30, 2021 FINDINGS: Lower chest: No acute abnormality. Hepatobiliary: No focal liver abnormality is seen. No gallstones or gallbladder wall thickening. The common bile duct is dilated and measures 1.5 cm in diameter. Mild to moderate severity central intrahepatic biliary dilatation is also seen. Pancreas: Diffuse atrophy of the pancreatic parenchyma is seen with subcentimeter parenchymal calcifications noted within the pancreatic head. Pancreatic ductal dilatation is noted (7.2 mm in diameter). Spleen: Normal in size without focal abnormality. Adrenals/Urinary Tract: Adrenal glands are unremarkable. Kidneys are normal in size, without renal calculi or hydronephrosis. Multiple stable, bilateral subcentimeter simple and indeterminate cysts are seen. The urinary bladder is unremarkable. Stomach/Bowel: Stomach is within normal limits. Appendix appears normal. No evidence of bowel wall thickening, distention, or inflammatory changes. Vascular/Lymphatic: Aortic atherosclerosis. No enlarged abdominal or pelvic lymph nodes. Reproductive: Status post hysterectomy. No adnexal masses. Other: No abdominal wall hernia or abnormality. No abdominopelvic ascites. Musculoskeletal: Postoperative changes are seen within the lumbar spine at the levels of L3, L4 and L5. IMPRESSION: 1. Findings consistent with chronic pancreatitis. 2. Stable, bilateral simple and indeterminate renal cysts. Correlation with nonemergent renal ultrasound is recommended. This recommendation follows ACR consensus guidelines: Management of the Incidental Renal Mass on CT: A White Paper of the ACR Incidental Findings Committee. J Am Coll Radiol [phone]. 3. Stable common bile duct and pancreatic ductal dilatation. Correlation with MRCP is recommended. 4. Postoperative changes within the lumbar spine. 5. Aortic atherosclerosis. Aortic Atherosclerosis (ICD10-I70.0). Electronically Signed   By: Virgina Norfolk M.D.    On: 02/04/2022 22:29   DG Chest 2 View  Result Date: 02/04/2022 CLINICAL DATA:  Chest pain. EXAM: CHEST - 2 VIEW COMPARISON:  Chest radiograph dated 09/26/2021. FINDINGS: Background of emphysema and chronic interstitial coarsening. No focal consolidation, pleural effusion, pneumothorax. Right upper lobe scarring stable cardiomediastinal silhouette. Atherosclerotic calcification of the aortic arch. Thoracic spine stimulator wires. No acute osseous pathology. IMPRESSION: 1. No acute cardiopulmonary process. 2. Emphysema. Electronically Signed   By: Anner Crete M.D.   On: 02/04/2022 22:27     Assessment   Jasmine [name] is a 66 y.o. 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 Presenting to the ED with abdominal pain, N/V.   Acute on chronic abdominal pain with N/V: known chronic pancreatitis. CT this admission with findings of chronic pancreatitis, stable CBD and pancreatic ductal dilatation. Lipase normal. LFTs normal. Not able to complete MRCP due to neurostimulator. She has been evaluated for EUS consideration but felt high risk in light of oxygen requirements and comorbidities. Recommend supportive measures at this time with pain and anti-emetics; will advance diet once able to tolerate. Needs to resume pancreatic enzymes once tolerating oral. Will need CT with pancreatic protocol as outpatient. Holding off on EGD as well; last was in April 2022.    Plan / Recommendations    Agree with Zofran around the clock Pain control, anti-emetics Patient requesting Morphine instead of Dilaudid: I have reached out to hospitalist.  Needs outpatient CT with pancreatic protocol Could consider EGD if no  improvement in symptoms this admission      02/06/2022, 11:51 AM  Annitta Needs, PhD, ANP-BC [facility]

## 2022-02-06 NOTE — Progress Notes (Signed)
PROGRESS NOTE  Jasmine [name] QIW:[id number] DOB: 30-Jan-1956 DOA: 02/04/2022 PCP: Janora Norlander, DO  Brief History:  66 year old female with history of COPD, chronic respiratory failure on 6-7 L, tobacco abuse, opioid dependence secondary to chronic back pain, hypertension, hyperlipidemia,nephrolithiasis, colon polyps (TA's), GERD, idiopathic pancreatitis, abnormal pancreas imaging with dilated duct, chronically dilated bile duct, presents with a 4-day history of abdominal pain more so in her lower abdomen with associated nausea and vomiting.  The patient denies any new medications.  She denies any alcohol or illicit substances.  She has not been taking any NSAIDs.  She states that she has not been able to tolerate any of her oral medications because of her emesis.  She has not had any fevers, chills, chest pain, hematemesis, hematochezia, melena.  She states that her breathing and coughing are the same as usual.  She denies any worsening lower extremity edema.  She denies any recent sick contacts or travels.  She has had some loose stools.  Notably, the patient has been following Dr. Rush Landmark for her chronic pancreatitis.  She last saw him on 01/03/2022.  At that visit it was felt best to defer elective EUS secondary to her chronic respiratory failure and risks involved with conscious sedation.  Plans were to obtain a CT of the pancreas with pancreatic protocol.  She was restarted on pancreatic enzyme supplementation.  In the ED, the patient had low-grade temperature of 99.3 F.  She was mildly tachycardic in 110 range and hemodynamically stable.  Oxygen saturation was 100% on 6 L.  WBC 4.8, hemoglobin 12.0, platelets 227,000.  Sodium 141, potassium 3.8, bicarbonate 28, serum creatinine 0.58.  AST 18, ALT 13, alk phosphatase 111, total bilirubin 1.0.  Troponin 8>>9.  EKG shows sinus rhythm with nonspecific ST changes.  The patient was started on IV fluids and IV opioids for her  intractable nausea and vomiting.  She was admitted for further workup and treatment.   Assessment/Plan: Intractable nausea and vomiting -Concerns for nonspecific viral gastroenteritis versus acute on chronic pancreatitis -Remain n.p.o. 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  No bowel wall thickening.  Stable CBD and pancreatic ductal dilatation. -Patient follows Dr. Rush Landmark -Restart Creon when able to tolerate po   Chronic respiratory failure with hypoxia -She is chronically on 6 to 7 L -Stable presently   Opioid dependence -PDMP reviewed -Patient receives OxyContin 15 mg, #60 monthly -Patient receives Percocet 10/325 #150 monthly -No red flags   COPD -Stable presently without exacerbation -Continue bronchodilators--yupelri, brovana, pulmicort   Tobacco abuse -Tobacco cessation discussed   Essential hypertension -Restart amlodipine and Zebeta -IV hydralazine prn SBP >180   Hyperlipidemia -Restart statin when able to tolerate p.o.      Family Communication:   no Family at bedside  Consultants:  GI  Code Status:  FULL  DVT Prophylaxis:  [place] Lovenox   Procedures: As Listed in Progress Note Above  Antibiotics: None     Subjective: Pt still having emesis, but slowly improving.  Abd pain is improving.  Denies cp, sob, hematochezia, melena, diarrhea, hemoptysis  Objective: Vitals:   02/06/22 0532 02/06/22 0706 02/06/22 1307 02/06/22 1728  BP: (!) 194/94   (!) 197/89  Pulse: 92   (!)  101  Resp: 20   20  Temp: 98.1 F (36.7 C)   98.7 F (37.1 C)  TempSrc: Oral   Oral  SpO2: 100% 100% 100% 100%  Weight:       Height:        Intake/Output Summary (Last 24 hours) at 02/06/2022 1740 Last data filed at 02/06/2022 1026 Gross per 24 hour  Intake 2198.43 ml  Output --  Net 2198.43 ml   Weight change: -1.856 kg Exam:  General:  Pt is alert, follows commands appropriately, not in acute distress HEENT: No icterus, No thrush, No neck mass, [place]/AT Cardiovascular: RRR, S1/S2, no rubs, no gallops Respiratory: bibasilar rales.  Diminshed BS.  No wheeze Abdomen: Soft/+BS, non tender, non distended, no guarding Extremities: No edema, No lymphangitis, No petechiae, No rashes, no synovitis   Data Reviewed: I have personally reviewed following labs and imaging studies Basic Metabolic Panel: Recent Labs  Lab 02/04/22 2008 02/06/22 0507  NA 141 135  K 3.8 3.1*  CL 101 97*  CO2 28 25  GLUCOSE 105* 67*  BUN 7* 8  CREATININE 0.58 0.64  CALCIUM 9.1 8.7*   Liver Function Tests: Recent Labs  Lab 02/04/22 2008 02/06/22 0507  AST 18 19  ALT 13 11  ALKPHOS 111 98  BILITOT 1.0 1.0  PROT 7.3 6.3*  ALBUMIN 3.6 3.2*   Recent Labs  Lab 02/04/22 2008  LIPASE 25   No results for input(s): "AMMONIA" in the last 168 hours. Coagulation Profile: No results for input(s): "INR", "PROTIME" in the last 168 hours. CBC: Recent Labs  Lab 02/04/22 2008 02/06/22 0507  WBC 4.8 4.9  HGB 12.0 10.9*  HCT 39.8 36.1  MCV 108.4* 108.7*  PLT 227 222   Cardiac Enzymes: No results for input(s): "CKTOTAL", "CKMB", "CKMBINDEX", "TROPONINI" in the last 168 hours. BNP: Invalid input(s): "POCBNP" CBG: No results for input(s): "GLUCAP" in the last 168 hours. HbA1C: No results for input(s): "HGBA1C" in the last 72 hours. Urine analysis:    Component Value Date/Time   COLORURINE YELLOW 02/04/2022 2141   APPEARANCEUR CLEAR 02/04/2022 2141   APPEARANCEUR Clear 06/07/2021 1524   LABSPEC 1.014 02/04/2022 2141   PHURINE 6.0 02/04/2022 2141   GLUCOSEU NEGATIVE 02/04/2022 2141   HGBUR NEGATIVE 02/04/2022 2141    BILIRUBINUR NEGATIVE 02/04/2022 2141   BILIRUBINUR Negative 06/07/2021 1524   KETONESUR 20 (A) 02/04/2022 2141   PROTEINUR 100 (A) 02/04/2022 2141   NITRITE NEGATIVE 02/04/2022 2141   LEUKOCYTESUR NEGATIVE 02/04/2022 2141   Sepsis Labs: _0 (procalcitonin:4,lacticidven:4) ) Recent Results (from the past 240 hour(s))  SARS Coronavirus 2 by RT PCR (hospital order, performed in [facility] hospital lab) *cepheid single result test* Anterior Nasal Swab     Status: None   Collection Time: 02/05/22  2:52 PM   Specimen: Anterior Nasal Swab  Result Value Ref Range Status   SARS Coronavirus 2 by RT PCR NEGATIVE NEGATIVE Final    Comment: (NOTE) SARS-CoV-2 target nucleic acids are NOT DETECTED.  The SARS-CoV-2 RNA is generally detectable in upper and lower respiratory specimens during the acute phase of infection. The lowest concentration of SARS-CoV-2 viral copies this assay can detect is 250 copies / mL. A negative result does not preclude SARS-CoV-2 infection and should not be used as the sole basis for treatment or other patient management decisions.  A negative result may occur with improper specimen collection / handling, submission of specimen other than nasopharyngeal swab, presence of viral mutation(s) within the areas targeted  by this assay, and inadequate number of viral copies (<250 copies / mL). A negative result must be combined with clinical observations, patient history, and epidemiological information.  Fact Sheet for Patients:   https://www.patel.info/  Fact Sheet for Healthcare Providers: https://hall.com/  This test is not yet approved or  cleared by the [place] FDA and has been authorized for detection and/or diagnosis of SARS-CoV-2 by FDA under an Emergency Use Authorization (EUA).  This EUA will remain in effect (meaning this test can be used) for the duration of the COVID-19 declaration under Section  564(b)(1) of the Act, 21 U.S.C. section 360bbb-3(b)(1), unless the authorization is terminated or revoked sooner.  Performed at [facility], [address], [place], [place] [address]      Scheduled Meds:  arformoterol  15 mcg Nebulization BID   budesonide (PULMICORT) nebulizer solution  0.5 mg Nebulization BID   enoxaparin (LOVENOX) injection  40 mg Subcutaneous Q24H   influenza vaccine adjuvanted  0.5 mL Intramuscular Tomorrow-1000   ipratropium-albuterol  3 mL Nebulization Q8H   ondansetron (ZOFRAN) IV  4 mg Intravenous Q6H   pantoprazole (PROTONIX) IV  40 mg Intravenous Q12H   traZODone  300 mg Oral QHS   Continuous Infusions:  lactated ringers 125 mL/hr at 02/06/22 1026   promethazine (PHENERGAN) injection (IM or IVPB) Stopped (02/06/22 0631)    Procedures/Studies: CT ABDOMEN PELVIS W CONTRAST  Result Date: 02/04/2022 CLINICAL DATA:  Abdominal pain with nausea and vomiting. EXAM: CT ABDOMEN AND PELVIS WITH CONTRAST TECHNIQUE: Multidetector CT imaging of the abdomen and pelvis was performed using the standard protocol following bolus administration of intravenous contrast. RADIATION DOSE REDUCTION: This exam was performed according to the departmental dose-optimization program which includes automated exposure control, adjustment of the mA and/or kV according to patient size and/or use of iterative reconstruction technique. CONTRAST:  168m OMNIPAQUE IOHEXOL 300 MG/ML  SOLN COMPARISON:  November 30, 2021 FINDINGS: Lower chest: No acute abnormality. Hepatobiliary: No focal liver abnormality is seen. No gallstones or gallbladder wall thickening. The common bile duct is dilated and measures 1.5 cm in diameter. Mild to moderate severity central intrahepatic biliary dilatation is also seen. Pancreas: Diffuse atrophy of the pancreatic parenchyma is seen with subcentimeter parenchymal calcifications noted within the pancreatic head. Pancreatic ductal dilatation is noted (7.2 mm in diameter).  Spleen: Normal in size without focal abnormality. Adrenals/Urinary Tract: Adrenal glands are unremarkable. Kidneys are normal in size, without renal calculi or hydronephrosis. Multiple stable, bilateral subcentimeter simple and indeterminate cysts are seen. The urinary bladder is unremarkable. Stomach/Bowel: Stomach is within normal limits. Appendix appears normal. No evidence of bowel wall thickening, distention, or inflammatory changes. Vascular/Lymphatic: Aortic atherosclerosis. No enlarged abdominal or pelvic lymph nodes. Reproductive: Status post hysterectomy. No adnexal masses. Other: No abdominal wall hernia or abnormality. No abdominopelvic ascites. Musculoskeletal: Postoperative changes are seen within the lumbar spine at the levels of L3, L4 and L5. IMPRESSION: 1. Findings consistent with chronic pancreatitis. 2. Stable, bilateral simple and indeterminate renal cysts. Correlation with nonemergent renal ultrasound is recommended. This recommendation follows ACR consensus guidelines: Management of the Incidental Renal Mass on CT: A White Paper of the ACR Incidental Findings Committee. J Am Coll Radiol [phone] 3. Stable common bile duct and pancreatic ductal dilatation. Correlation with MRCP is recommended. 4. Postoperative changes within the lumbar spine. 5. Aortic atherosclerosis. Aortic Atherosclerosis (ICD10-I70.0). Electronically Signed   By: TVirgina NorfolkM.D.   On: 02/04/2022 22:29   DG Chest 2 View  Result Date: 02/04/2022  CLINICAL DATA:  Chest pain. EXAM: CHEST - 2 VIEW COMPARISON:  Chest radiograph dated 09/26/2021. FINDINGS: Background of emphysema and chronic interstitial coarsening. No focal consolidation, pleural effusion, pneumothorax. Right upper lobe scarring stable cardiomediastinal silhouette. Atherosclerotic calcification of the aortic arch. Thoracic spine stimulator wires. No acute osseous pathology. IMPRESSION: 1. No acute cardiopulmonary process. 2. Emphysema.  Electronically Signed   By: Anner Crete M.D.   On: 02/04/2022 22:27    Orson Eva, DO  [facility]  If 7PM-7AM, please contact night-coverage [email or web address] Password [email or web address] 02/06/2022, 5:40 PM   LOS: 1 day

## 2022-02-06 NOTE — Progress Notes (Signed)
   02/06/22 0532  Vitals  Temp 98.1 F (36.7 C)  Temp Source Oral  BP (!) 194/94  MAP (mmHg) 122  BP Location Left Arm  BP Method Automatic  Patient Position (if appropriate) Lying  Pulse Rate 92  Pulse Rate Source Monitor  Resp 20  MEWS COLOR  MEWS Score Color Green  Oxygen Therapy  SpO2 100 %  O2 Device Nasal Cannula  O2 Flow Rate (L/min) 6 L/min  MEWS Score  MEWS Temp 0  MEWS Systolic 0  MEWS Pulse 0  MEWS RR 0  MEWS LOC 0  MEWS Score 0   Patient stated she has not been able to keep her blood pressure medicine down due to nausea

## 2022-02-07 DIAGNOSIS — R1013 Epigastric pain: Secondary | ICD-10-CM

## 2022-02-07 DIAGNOSIS — R112 Nausea with vomiting, unspecified: Secondary | ICD-10-CM

## 2022-02-07 DIAGNOSIS — F1120 Opioid dependence, uncomplicated: Secondary | ICD-10-CM | POA: Diagnosis not present

## 2022-02-07 DIAGNOSIS — J432 Centrilobular emphysema: Secondary | ICD-10-CM | POA: Diagnosis not present

## 2022-02-07 DIAGNOSIS — K861 Other chronic pancreatitis: Secondary | ICD-10-CM | POA: Diagnosis not present

## 2022-02-07 DIAGNOSIS — K859 Acute pancreatitis without necrosis or infection, unspecified: Secondary | ICD-10-CM | POA: Diagnosis not present

## 2022-02-07 LAB — BASIC METABOLIC PANEL
Anion gap: 20 — ABNORMAL HIGH (ref 5–15)
BUN: 6 mg/dL — ABNORMAL LOW (ref 8–23)
CO2: 19 mmol/L — ABNORMAL LOW (ref 22–32)
Calcium: 8.6 mg/dL — ABNORMAL LOW (ref 8.9–10.3)
Chloride: 98 mmol/L (ref 98–111)
Creatinine, Ser: 0.76 mg/dL (ref 0.44–1.00)
GFR, Estimated: >60 mL/min (ref >60)
Glucose, Bld: 70 mg/dL (ref 70–99)
Potassium: 2.9 mmol/L — ABNORMAL LOW (ref 3.5–5.1)
Sodium: 137 mmol/L (ref 135–145)

## 2022-02-07 LAB — CBC
HCT: 38 % (ref 36.0–46.0)
Hemoglobin: 11.5 g/dL — ABNORMAL LOW (ref 12.0–15.0)
MCH: 32.8 pg (ref 26.0–34.0)
MCHC: 30.3 g/dL (ref 30.0–36.0)
MCV: 108.3 fL — ABNORMAL HIGH (ref 80.0–100.0)
Platelets: 224 10*3/uL (ref 150–400)
RBC: 3.51 MIL/uL — ABNORMAL LOW (ref 3.87–5.11)
RDW: 14.1 % (ref 11.5–15.5)
WBC: 3.8 10*3/uL — ABNORMAL LOW (ref 4.0–10.5)
nRBC: 0 % (ref 0.0–0.2)

## 2022-02-07 LAB — MAGNESIUM: Magnesium: 1.6 mg/dL — ABNORMAL LOW (ref 1.7–2.4)

## 2022-02-07 MED ORDER — POTASSIUM CHLORIDE IN NACL 40-0.9 MEQ/L-% IV SOLN: INTRAVENOUS | Status: DC

## 2022-02-07 MED ORDER — MAGNESIUM SULFATE 2 GM/50ML IV SOLN
2.0000 g | Freq: Once | INTRAVENOUS | Status: AC
  Administered 2022-02-07: 2 g via INTRAVENOUS
  Filled 2022-02-07: qty 50

## 2022-02-07 MED ORDER — POTASSIUM CHLORIDE 10 MEQ/100ML IV SOLN
10.0000 meq | INTRAVENOUS | Status: AC
  Administered 2022-02-07 (×2): 10 meq via INTRAVENOUS
  Filled 2022-02-07 (×2): qty 100

## 2022-02-07 MED ORDER — ALBUTEROL SULFATE (2.5 MG/3ML) 0.083% IN NEBU
2.5000 mg | INHALATION_SOLUTION | Freq: Three times a day (TID) | RESPIRATORY_TRACT | Status: DC
  Administered 2022-02-07 – 2022-02-08 (×3): 2.5 mg via RESPIRATORY_TRACT
  Filled 2022-02-07 (×3): qty 3

## 2022-02-07 NOTE — Progress Notes (Addendum)
PROGRESS NOTE  Jasmine [name] BTD:[id number] DOB: Jul 10, 1955 DOA: 02/04/2022 PCP: Janora Norlander, DO  Brief History:  66 year old female with history of COPD, chronic respiratory failure on 6-7 L, tobacco abuse, opioid dependence secondary to chronic back pain, hypertension, hyperlipidemia,nephrolithiasis, colon polyps (TA's), GERD, idiopathic pancreatitis, abnormal pancreas imaging with dilated duct, chronically dilated bile duct, presents with a 4-day history of abdominal pain more so in her lower abdomen with associated nausea and vomiting.  The patient denies any new medications.  She denies any alcohol or illicit substances.  She has not been taking any NSAIDs.  She states that she has not been able to tolerate any of her oral medications because of her emesis.  She has not had any fevers, chills, chest pain, hematemesis, hematochezia, melena.  She states that her breathing and coughing are the same as usual.  She denies any worsening lower extremity edema.  She denies any recent sick contacts or travels.  She has had some loose stools.  Notably, the patient has been following Dr. Rush Landmark for her chronic pancreatitis.  She last saw him on 01/03/2022.  At that visit it was felt best to defer elective EUS secondary to her chronic respiratory failure and risks involved with conscious sedation.  Plans were to obtain a CT of the pancreas with pancreatic protocol.  She was restarted on pancreatic enzyme supplementation.  In the ED, the patient had low-grade temperature of 99.3 F.  She was mildly tachycardic in 110 range and hemodynamically stable.  Oxygen saturation was 100% on 6 L.  WBC 4.8, hemoglobin 12.0, platelets 227,000.  Sodium 141, potassium 3.8, bicarbonate 28, serum creatinine 0.58.  AST 18, ALT 13, alk phosphatase 111, total bilirubin 1.0.  Troponin 8>>9.  EKG shows sinus rhythm with nonspecific ST changes.  The patient was started on IV fluids and IV opioids for her  intractable nausea and vomiting.  She was admitted for further workup and treatment.   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  No bowel wall thickening.  Stable CBD and pancreatic ductal dilatation. -Patient follows Dr. Rush Landmark -Restart Creon when able to tolerate po   Chronic respiratory failure with hypoxia -She is chronically on 6 to 7 L -Stable presently   Opioid dependence -PDMP reviewed -Patient receives OxyContin 15 mg, #60 monthly -Patient receives Percocet 10/325 #150 monthly -No red flags   COPD -Stable presently without exacerbation -Continue bronchodilators--yupelri, brovana, pulmicort   Tobacco abuse -Tobacco cessation discussed   Essential hypertension -Restart amlodipine and Zebeta -IV hydralazine prn SBP >180   Hyperlipidemia -Restart statin when able to tolerate p.o.   Hypokalemia -add KCl to IVF -replete IV       Family Communication:   no Family at bedside   Consultants:  GI   Code Status:  FULL   DVT Prophylaxis:  [place] Lovenox     Procedures: As Listed in Progress Note Above   Antibiotics: None               Subjective: She is vomiting less. No emesis since 12/4 evening.  Abd pain is slightly better.  Denies  f/c, cp, sob, hemoptysis, diarrhea  Objective: Vitals:   02/07/22 0714 02/07/22 0810 02/07/22 1357 02/07/22 1409  BP:  (!) 143/69  129/65  Pulse: 83  75 78  Resp: _0 Temp:    98.4 F (36.9 C)  TempSrc:    Oral  SpO2: 100%  100%  100%  Weight:      Height:        Intake/Output Summary (Last 24 hours) at 02/07/2022 1804 Last data filed at 02/07/2022 0451 Gross per 24 hour  Intake 971.58 ml  Output 800 ml  Net 171.58 ml   Weight change:  Exam:  General:  Pt is alert, follows commands appropriately, not in acute distress HEENT: No icterus, No thrush, No neck mass, Circle/AT Cardiovascular: RRR, S1/S2, no rubs, no gallops Respiratory: bibasilar rales. No wheeze.  Diminshed BS bilater Abdomen: Soft/+BS, non tender, non distended, no guarding Extremities: No edema, No lymphangitis, No petechiae, No rashes, no synovitis   Data Reviewed: I have personally reviewed following labs and imaging studies Basic Metabolic Panel: Recent Labs  Lab 02/04/22 2008 02/06/22 0507 02/07/22 0521  NA 141 135 137  K 3.8 3.1* 2.9*  CL 101 97* 98  CO2 28 25 19*  GLUCOSE 105* 67* 70  BUN 7* 8 6*  CREATININE 0.58 0.64 0.76  CALCIUM 9.1 8.7* 8.6*  MG  --   --  1.6*   Liver Function Tests: Recent Labs  Lab 02/04/22 2008 02/06/22 0507  AST 18 19  ALT 13 11  ALKPHOS 111 98  BILITOT 1.0 1.0  PROT 7.3 6.3*  ALBUMIN 3.6 3.2*   Recent Labs  Lab 02/04/22 2008  LIPASE 25   No results for input(s): "AMMONIA" in the last 168 hours. Coagulation Profile: No results for input(s): "INR", "PROTIME" in the last 168 hours. CBC: Recent Labs  Lab 02/04/22 2008 02/06/22 0507 02/07/22 0521  WBC 4.8 4.9 3.8*  HGB 12.0 10.9* 11.5*  HCT 39.8 36.1 38.0  MCV 108.4* 108.7* 108.3*  PLT 227 222 224   Cardiac Enzymes: No results for input(s): "CKTOTAL", "CKMB", "CKMBINDEX", "TROPONINI" in the last 168 hours. BNP: Invalid input(s): "POCBNP" CBG: No results for input(s): "GLUCAP" in the last 168 hours. HbA1C: No results for input(s): "HGBA1C" in the last 72 hours. Urine analysis:    Component Value Date/Time   COLORURINE YELLOW 02/04/2022 2141   APPEARANCEUR CLEAR 02/04/2022 2141   APPEARANCEUR Clear 06/07/2021 1524   LABSPEC  1.014 02/04/2022 2141   PHURINE 6.0 02/04/2022 2141   GLUCOSEU NEGATIVE 02/04/2022 2141   HGBUR NEGATIVE 02/04/2022 2141   BILIRUBINUR NEGATIVE 02/04/2022 2141   BILIRUBINUR Negative 06/07/2021 1524   KETONESUR 20 (A) 02/04/2022 2141   PROTEINUR 100 (A) 02/04/2022 2141   NITRITE NEGATIVE 02/04/2022 2141   LEUKOCYTESUR NEGATIVE 02/04/2022 2141   Sepsis Labs: _1 (procalcitonin:4,lacticidven:4) ) Recent Results (from the past 240 hour(s))  SARS Coronavirus 2 by RT PCR (hospital order, performed in [facility] hospital lab) *cepheid single result test* Anterior Nasal Swab     Status: None   Collection Time: 02/05/22  2:52 PM   Specimen: Anterior Nasal Swab  Result Value Ref Range Status   SARS Coronavirus 2 by RT PCR NEGATIVE NEGATIVE Final    Comment: (NOTE) SARS-CoV-2 target nucleic acids are NOT DETECTED.  The SARS-CoV-2 RNA is generally detectable in upper and lower respiratory specimens during the acute phase of infection. The lowest concentration of SARS-CoV-2 viral copies this assay can detect is 250 copies /  mL. A negative result does not preclude SARS-CoV-2 infection and should not be used as the sole basis for treatment or other patient management decisions.  A negative result may occur with improper specimen collection / handling, submission of specimen other than nasopharyngeal swab, presence of viral mutation(s) within the areas targeted by this assay, and inadequate number of viral copies (<250 copies / mL). A negative result must be combined with clinical observations, patient history, and epidemiological information.  Fact Sheet for Patients:   https://www.patel.info/  Fact Sheet for Healthcare Providers: https://hall.com/  This test is not yet approved or  cleared by the [place] FDA and has been authorized for detection and/or diagnosis of SARS-CoV-2 by FDA under an Emergency Use Authorization (EUA).  This  EUA will remain in effect (meaning this test can be used) for the duration of the COVID-19 declaration under Section 564(b)(1) of the Act, 21 U.S.C. section 360bbb-3(b)(1), unless the authorization is terminated or revoked sooner.  Performed at [facility], [address], [place], [place] [address]      Scheduled Meds:  albuterol  2.5 mg Nebulization TID   amLODipine  10 mg Oral Daily   arformoterol  15 mcg Nebulization BID   bisoprolol  2.5 mg Oral Daily   budesonide (PULMICORT) nebulizer solution  0.5 mg Nebulization BID   enoxaparin (LOVENOX) injection  40 mg Subcutaneous Q24H   influenza vaccine adjuvanted  0.5 mL Intramuscular Tomorrow-1000   ondansetron (ZOFRAN) IV  4 mg Intravenous Q6H   pantoprazole (PROTONIX) IV  40 mg Intravenous Q12H   revefenacin  175 mcg Nebulization Daily   traZODone  300 mg Oral QHS   Continuous Infusions:  0.9 % NaCl with KCl 40 mEq / L 125 mL/hr at 02/07/22 1720   magnesium sulfate bolus IVPB 2 g (02/07/22 1707)   potassium chloride 10 mEq (02/07/22 1721)   promethazine (PHENERGAN) injection (IM or IVPB) 12.5 mg (02/06/22 2108)    Procedures/Studies: CT ABDOMEN PELVIS W CONTRAST  Result Date: 02/04/2022 CLINICAL DATA:  Abdominal pain with nausea and vomiting. EXAM: CT ABDOMEN AND PELVIS WITH CONTRAST TECHNIQUE: Multidetector CT imaging of the abdomen and pelvis was performed using the standard protocol following bolus administration of intravenous contrast. RADIATION DOSE REDUCTION: This exam was performed according to the departmental dose-optimization program which includes automated exposure control, adjustment of the mA and/or kV according to patient size and/or use of iterative reconstruction technique. CONTRAST:  154m OMNIPAQUE IOHEXOL 300 MG/ML  SOLN COMPARISON:  November 30, 2021 FINDINGS: Lower chest: No acute abnormality. Hepatobiliary: No focal liver abnormality is seen. No gallstones or gallbladder wall thickening. The common bile duct  is dilated and measures 1.5 cm in diameter. Mild to moderate severity central intrahepatic biliary dilatation is also seen. Pancreas: Diffuse atrophy of the pancreatic parenchyma is seen with subcentimeter parenchymal calcifications noted within the pancreatic head. Pancreatic ductal dilatation is noted (7.2 mm in diameter). Spleen: Normal in size without focal abnormality. Adrenals/Urinary Tract: Adrenal glands are unremarkable. Kidneys are normal in size, without renal calculi or hydronephrosis. Multiple stable, bilateral subcentimeter simple and indeterminate cysts are seen. The urinary bladder is unremarkable. Stomach/Bowel: Stomach is within normal limits. Appendix appears normal. No evidence of bowel wall thickening, distention, or inflammatory changes. Vascular/Lymphatic: Aortic atherosclerosis. No enlarged abdominal or pelvic lymph nodes. Reproductive: Status post hysterectomy. No adnexal masses. Other: No abdominal wall hernia or abnormality. No abdominopelvic ascites. Musculoskeletal: Postoperative changes are seen within the lumbar spine at the levels of L3, L4 and  L5. IMPRESSION: 1. Findings consistent with chronic pancreatitis. 2. Stable, bilateral simple and indeterminate renal cysts. Correlation with nonemergent renal ultrasound is recommended. This recommendation follows ACR consensus guidelines: Management of the Incidental Renal Mass on CT: A White Paper of the ACR Incidental Findings Committee. J Am Coll Radiol [phone]. 3. Stable common bile duct and pancreatic ductal dilatation. Correlation with MRCP is recommended. 4. Postoperative changes within the lumbar spine. 5. Aortic atherosclerosis. Aortic Atherosclerosis (ICD10-I70.0). Electronically Signed   By: Virgina Norfolk M.D.   On: 02/04/2022 22:29   DG Chest 2 View  Result Date: 02/04/2022 CLINICAL DATA:  Chest pain. EXAM: CHEST - 2 VIEW COMPARISON:  Chest radiograph dated 09/26/2021. FINDINGS: Background of emphysema and chronic  interstitial coarsening. No focal consolidation, pleural effusion, pneumothorax. Right upper lobe scarring stable cardiomediastinal silhouette. Atherosclerotic calcification of the aortic arch. Thoracic spine stimulator wires. No acute osseous pathology. IMPRESSION: 1. No acute cardiopulmonary process. 2. Emphysema. Electronically Signed   By: Anner Crete M.D.   On: 02/04/2022 22:27    Orson Eva, DO  [facility]  If 7PM-7AM, please contact night-coverage [email or web address] Password [email or web address] 02/07/2022, 6:04 PM   LOS: 2 days

## 2022-02-07 NOTE — Progress Notes (Signed)
Gastroenterology Progress Note    Primary Care Physician:  Janora Norlander, DO Primary Gastroenterologist:  Dr.Rourk   Patient ID: Jasmine [name]; [id number]; Feb 18, 1956    Subjective   Abdominal pain slightly improved. Vomiting overnight. Does not feel ready to advance diet.    Objective   Vital signs in last 24 hours Temp:  [98.4 F (36.9 C)-98.7 F (37.1 C)] 98.4 F (36.9 C) (12/05 0451) Pulse Rate:  [83-101] 83 (12/05 0714) Resp:  [16-20] 16 (12/05 0714) BP: (134-197)/(69-89) 143/69 (12/05 0810) SpO2:  [99 %-100 %] 100 % (12/05 0714) Last BM Date : 02/04/22  Physical Exam General:   Alert and oriented, pleasant Abdomen:  Bowel sounds present, soft, TTP upper abdomen, non-distended. No HSM or hernias noted. No rebound or guarding. No masses appreciated  Extremities:  Without edema. Neurologic:  Alert and  oriented x4 Psych:  Alert and cooperative. Normal mood and affect.  Intake/Output from previous day: 12/04 0701 - 12/05 0700 In: 1553.1 [I.V.:1503.1; IV Piggyback:50] Out: 800 [Urine:800] Intake/Output this shift: No intake/output data recorded.  Lab Results  Recent Labs    02/04/22 2008 02/06/22 0507 02/07/22 0521  WBC 4.8 4.9 3.8*  HGB 12.0 10.9* 11.5*  HCT 39.8 36.1 38.0  PLT 227 222 224   BMET Recent Labs    02/04/22 2008 02/06/22 0507 02/07/22 0521  NA 141 135 137  K 3.8 3.1* 2.9*  CL 101 97* 98  CO2 28 25 19*  GLUCOSE 105* 67* 70  BUN 7* 8 6*  CREATININE 0.58 0.64 0.76  CALCIUM 9.1 8.7* 8.6*   LFT Recent Labs    02/04/22 2008 02/06/22 0507  PROT 7.3 6.3*  ALBUMIN 3.6 3.2*  AST 18 19  ALT 13 11  ALKPHOS 111 98  BILITOT 1.0 1.0   Studies/Results CT ABDOMEN PELVIS W CONTRAST  Result Date: 02/04/2022 CLINICAL DATA:  Abdominal pain with nausea and vomiting. EXAM: CT ABDOMEN AND PELVIS WITH CONTRAST TECHNIQUE: Multidetector CT imaging of the abdomen and pelvis was performed using the standard protocol following bolus  administration of intravenous contrast. RADIATION DOSE REDUCTION: This exam was performed according to the departmental dose-optimization program which includes automated exposure control, adjustment of the mA and/or kV according to patient size and/or use of iterative reconstruction technique. CONTRAST:  139mL OMNIPAQUE IOHEXOL 300 MG/ML  SOLN COMPARISON:  November 30, 2021 FINDINGS: Lower chest: No acute abnormality. Hepatobiliary: No focal liver abnormality is seen. No gallstones or gallbladder wall thickening. The common bile duct is dilated and measures 1.5 cm in diameter. Mild to moderate severity central intrahepatic biliary dilatation is also seen. Pancreas: Diffuse atrophy of the pancreatic parenchyma is seen with subcentimeter parenchymal calcifications noted within the pancreatic head. Pancreatic ductal dilatation is noted (7.2 mm in diameter). Spleen: Normal in size without focal abnormality. Adrenals/Urinary Tract: Adrenal glands are unremarkable. Kidneys are normal in size, without renal calculi or hydronephrosis. Multiple stable, bilateral subcentimeter simple and indeterminate cysts are seen. The urinary bladder is unremarkable. Stomach/Bowel: Stomach is within normal limits. Appendix appears normal. No evidence of bowel wall thickening, distention, or inflammatory changes. Vascular/Lymphatic: Aortic atherosclerosis. No enlarged abdominal or pelvic lymph nodes. Reproductive: Status post hysterectomy. No adnexal masses. Other: No abdominal wall hernia or abnormality. No abdominopelvic ascites. Musculoskeletal: Postoperative changes are seen within the lumbar spine at the levels of L3, L4 and L5. IMPRESSION: 1. Findings consistent with chronic pancreatitis. 2. Stable, bilateral simple and indeterminate renal cysts. Correlation with nonemergent renal ultrasound is recommended. This  recommendation follows ACR consensus guidelines: Management of the Incidental Renal Mass on CT: A White Paper of the ACR  Incidental Findings Committee. J Am Coll Radiol [phone]. 3. Stable common bile duct and pancreatic ductal dilatation. Correlation with MRCP is recommended. 4. Postoperative changes within the lumbar spine. 5. Aortic atherosclerosis. Aortic Atherosclerosis (ICD10-I70.0). Electronically Signed   By: Virgina Norfolk M.D.   On: 02/04/2022 22:29   DG Chest 2 View  Result Date: 02/04/2022 CLINICAL DATA:  Chest pain. EXAM: CHEST - 2 VIEW COMPARISON:  Chest radiograph dated 09/26/2021. FINDINGS: Background of emphysema and chronic interstitial coarsening. No focal consolidation, pleural effusion, pneumothorax. Right upper lobe scarring stable cardiomediastinal silhouette. Atherosclerotic calcification of the aortic arch. Thoracic spine stimulator wires. No acute osseous pathology. IMPRESSION: 1. No acute cardiopulmonary process. 2. Emphysema. Electronically Signed   By: Anner Crete M.D.   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 Presenting to the ED with abdominal pain, N/V.   Acute on chronic abdominal pain with N/V: history of chronic pancreatitis, CT with findings of chronic pancreatitis, stable CBD and pancreatic ductal dilatation. Lipase normal. LFTs normal. Not able to complete MRCP due to neurostimulator. She has been evaluated for EUS consideration but felt high risk in light of oxygen requirements and comorbidities. She does have mild improvement in abdominal pain but had vomiting again overnight. Desires to hold off on trial of clear liquids until she sees how she does today.  If unable to advance diet, may need to consider EGD. Will also need to resume  pancreatic enzymes when diet is advanced, and pursue CT with pancreatic protocol as outpatient.   Hypokalemia: potassium 2.9 today. Replacement per hospitalist.    Plan / Recommendations  PPI BID Zofran around the clock Supportive measures Consider EGD if no improvement Hopeful clear liquids later today: I have asked patient to reach out to nursing staff if she would like to trial this Outpatient CT with pancreatic protocol Pancreatic enzymes once advances diet    LOS: 2 days    02/07/2022, 11:07 AM  Annitta Needs, PhD, ANP-BC [facility]

## 2022-02-08 DIAGNOSIS — R112 Nausea with vomiting, unspecified: Secondary | ICD-10-CM

## 2022-02-08 DIAGNOSIS — R1013 Epigastric pain: Secondary | ICD-10-CM | POA: Diagnosis not present

## 2022-02-08 DIAGNOSIS — K861 Other chronic pancreatitis: Secondary | ICD-10-CM | POA: Diagnosis not present

## 2022-02-08 DIAGNOSIS — K859 Acute pancreatitis without necrosis or infection, unspecified: Secondary | ICD-10-CM | POA: Diagnosis not present

## 2022-02-08 LAB — BASIC METABOLIC PANEL
Anion gap: 10 (ref 5–15)
BUN: <5 mg/dL — ABNORMAL LOW (ref 8–23)
CO2: 24 mmol/L (ref 22–32)
Calcium: 8.2 mg/dL — ABNORMAL LOW (ref 8.9–10.3)
Chloride: 102 mmol/L (ref 98–111)
Creatinine, Ser: 0.6 mg/dL (ref 0.44–1.00)
GFR, Estimated: >60 mL/min (ref >60)
Glucose, Bld: 96 mg/dL (ref 70–99)
Potassium: 3.7 mmol/L (ref 3.5–5.1)
Sodium: 136 mmol/L (ref 135–145)

## 2022-02-08 LAB — MAGNESIUM: Magnesium: 1.9 mg/dL (ref 1.7–2.4)

## 2022-02-08 MED ORDER — ALBUTEROL SULFATE (2.5 MG/3ML) 0.083% IN NEBU: 2.5000 mg | INHALATION_SOLUTION | Freq: Four times a day (QID) | RESPIRATORY_TRACT | Status: DC | PRN

## 2022-02-08 NOTE — Progress Notes (Signed)
PROGRESS NOTE  Jasmine [name] WUJ:[id number] DOB: 23-May-1955 DOA: 02/04/2022 PCP: Janora Norlander, DO  Brief History:  66 year old female with history of COPD, chronic respiratory failure on 6-7 L, tobacco abuse, opioid dependence secondary to chronic back pain, hypertension, hyperlipidemia,nephrolithiasis, colon polyps (TA's), GERD, idiopathic pancreatitis, abnormal pancreas imaging with dilated duct, chronically dilated bile duct, presents with a 4-day history of abdominal pain more so in her lower abdomen with associated nausea and vomiting.  The patient denies any new medications.  She denies any alcohol or illicit substances.  She has not been taking any NSAIDs.  She states that she has not been able to tolerate any of her oral medications because of her emesis.  She has not had any fevers, chills, chest pain, hematemesis, hematochezia, melena.  She states that her breathing and coughing are the same as usual.  She denies any worsening lower extremity edema.  She denies any recent sick contacts or travels.  She has had some loose stools.  Notably, the patient has been following Dr. Rush Landmark for her chronic pancreatitis.  She last saw him on 01/03/2022.  At that visit it was felt best to defer elective EUS secondary to her chronic respiratory failure and risks involved with conscious sedation.  Plans were to obtain a CT of the pancreas with pancreatic protocol.  She was restarted on pancreatic enzyme supplementation.  In the ED, the patient had low-grade temperature of 99.3 F.  She was mildly tachycardic in 110 range and hemodynamically stable.  Oxygen saturation was 100% on 6 L.  WBC 4.8, hemoglobin 12.0, platelets 227,000.  Sodium 141, potassium 3.8, bicarbonate 28, serum creatinine 0.58.  AST 18, ALT 13, alk phosphatase 111, total bilirubin 1.0.  Troponin 8>>9.  EKG shows sinus rhythm with nonspecific ST changes.  The patient was started on IV fluids and IV opioids for her  intractable nausea and vomiting.  She was admitted for further workup and treatment.   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  No bowel wall thickening.  Stable CBD and pancreatic ductal dilatation. -Patient follows Dr. Rush Landmark -Restart Creon when ok with GI and able to tolerate oral food better    Chronic respiratory failure with hypoxia -She is chronically on 6 to 7 L -Stable presently   Opioid dependence -PDMP reviewed -Patient receives OxyContin 15 mg, #60 monthly -Patient receives Percocet 10/325 #150 monthly -No red flags   COPD -Stable presently without exacerbation -Continue bronchodilators--yupelri, brovana, pulmicort   Tobacco abuse -Tobacco cessation discussed   Essential hypertension -Restarted amlodipine and Zebeta -IV hydralazine prn SBP >180   Hyperlipidemia -Restart statin when able to tolerate p.o. better    Hypokalemia -added KCl to IVF -repleted   Hypomagnesemia - repleted     Family Communication:   no Family at bedside   Consultants:  GI   Code Status:  FULL   DVT Prophylaxis:  [place] Lovenox     Procedures: As Listed in Progress Note Above   Antibiotics: None   Subjective: Pt barely able to tolerate clear breakfast this morning.    Objective: Vitals:  02/07/22 1926 02/07/22 2028 02/08/22 0323 02/08/22 0712  BP:  (!) 146/76 (!) 144/57   Pulse:  85 82 83  Resp:   16 14  Temp:  98.1 F (36.7 C) 98.6 F (37 C)   TempSrc:      SpO2: 100% 100%  100% 99%  Weight:      Height:        Intake/Output Summary (Last 24 hours) at 02/08/2022 1141 Last data filed at 02/08/2022 0500 Gross per 24 hour  Intake 1641.2 ml  Output 500 ml  Net 1141.2 ml   Weight change:  Exam:  General:  Pt is alert, follows commands appropriately, not in acute distress HEENT: No icterus, No thrush, No neck mass, [place]/AT Cardiovascular: normal S1/S2, no rubs, no gallops Respiratory: bibasilar rales. No wheeze.  Diminshed BS bilater Abdomen: soft, normal BS, mild epigastric TTP, no guarding.  Extremities: No edema, No lymphangitis, No petechiae, No rashes, no synovitis   Data Reviewed: I have personally reviewed following labs and imaging studies Basic Metabolic Panel: Recent Labs  Lab 02/04/22 2008 02/06/22 0507 02/07/22 0521 02/08/22 0541  NA 141 135 137 136  K 3.8 3.1* 2.9* 3.7  CL 101 97* 98 102  CO2 28 25 19* 24  GLUCOSE 105* 67* 70 96  BUN 7* 8 6* <5*  CREATININE 0.58 0.64 0.76 0.60  CALCIUM 9.1 8.7* 8.6* 8.2*  MG  --   --  1.6* 1.9   Liver Function Tests: Recent Labs  Lab 02/04/22 2008 02/06/22 0507  AST 18 19  ALT 13 11  ALKPHOS 111 98  BILITOT 1.0 1.0  PROT 7.3 6.3*  ALBUMIN 3.6 3.2*   Recent Labs  Lab 02/04/22 2008  LIPASE 25   No results for input(s): "AMMONIA" in the last 168 hours. Coagulation Profile: No results for input(s): "INR", "PROTIME" in the last 168 hours. CBC: Recent Labs  Lab 02/04/22 2008 02/06/22 0507 02/07/22 0521  WBC 4.8 4.9 3.8*  HGB 12.0 10.9* 11.5*  HCT 39.8 36.1 38.0  MCV 108.4* 108.7* 108.3*  PLT 227 222 224   Cardiac Enzymes: No results for input(s): "CKTOTAL", "CKMB", "CKMBINDEX", "TROPONINI" in the last 168 hours. BNP: Invalid input(s): "POCBNP" CBG: No results for input(s): "GLUCAP" in the last 168 hours. HbA1C: No results for input(s): "HGBA1C" in the last 72 hours. Urine analysis:    Component Value Date/Time   COLORURINE YELLOW 02/04/2022 2141   APPEARANCEUR CLEAR  02/04/2022 2141   APPEARANCEUR Clear 06/07/2021 1524   LABSPEC 1.014 02/04/2022 2141   PHURINE 6.0 02/04/2022 2141   GLUCOSEU NEGATIVE 02/04/2022 2141   HGBUR NEGATIVE 02/04/2022 2141   BILIRUBINUR NEGATIVE 02/04/2022 2141   BILIRUBINUR Negative 06/07/2021 1524   KETONESUR 20 (A) 02/04/2022 2141   PROTEINUR 100 (A) 02/04/2022 2141   NITRITE NEGATIVE 02/04/2022 2141   LEUKOCYTESUR NEGATIVE 02/04/2022 2141   Sepsis Labs: _0 (procalcitonin:4,lacticidven:4) ) Recent Results (from the past 240 hour(s))  SARS Coronavirus 2 by RT PCR (hospital order, performed in [facility] hospital lab) *cepheid single result test* Anterior Nasal Swab     Status: None   Collection Time: 02/05/22  2:52 PM   Specimen: Anterior Nasal Swab  Result Value Ref Range Status   SARS Coronavirus 2 by RT PCR NEGATIVE NEGATIVE Final    Comment: (NOTE) SARS-CoV-2 target nucleic acids are NOT DETECTED.  The SARS-CoV-2 RNA is generally detectable in upper and lower respiratory specimens during the acute phase of infection. The lowest concentration of SARS-CoV-2 viral copies  this assay can detect is 250 copies / mL. A negative result does not preclude SARS-CoV-2 infection and should not be used as the sole basis for treatment or other patient management decisions.  A negative result may occur with improper specimen collection / handling, submission of specimen other than nasopharyngeal swab, presence of viral mutation(s) within the areas targeted by this assay, and inadequate number of viral copies (<250 copies / mL). A negative result must be combined with clinical observations, patient history, and epidemiological information.  Fact Sheet for Patients:   https://www.patel.info/  Fact Sheet for Healthcare Providers: https://hall.com/  This test is not yet approved or  cleared by the [place] FDA and has been authorized for detection and/or diagnosis of  SARS-CoV-2 by FDA under an Emergency Use Authorization (EUA).  This EUA will remain in effect (meaning this test can be used) for the duration of the COVID-19 declaration under Section 564(b)(1) of the Act, 21 U.S.C. section 360bbb-3(b)(1), unless the authorization is terminated or revoked sooner.  Performed at [facility], [address], [place], [place] [address]      Scheduled Meds:  amLODipine  10 mg Oral Daily   arformoterol  15 mcg Nebulization BID   bisoprolol  2.5 mg Oral Daily   budesonide (PULMICORT) nebulizer solution  0.5 mg Nebulization BID   enoxaparin (LOVENOX) injection  40 mg Subcutaneous Q24H   influenza vaccine adjuvanted  0.5 mL Intramuscular Tomorrow-1000   ondansetron (ZOFRAN) IV  4 mg Intravenous Q6H   pantoprazole (PROTONIX) IV  40 mg Intravenous Q12H   revefenacin  175 mcg Nebulization Daily   traZODone  300 mg Oral QHS   Continuous Infusions:  0.9 % NaCl with KCl 40 mEq / L 125 mL/hr at 02/08/22 0342   promethazine (PHENERGAN) injection (IM or IVPB) Stopped (02/08/22 0200)    Procedures/Studies: CT ABDOMEN PELVIS W CONTRAST  Result Date: 02/04/2022 CLINICAL DATA:  Abdominal pain with nausea and vomiting. EXAM: CT ABDOMEN AND PELVIS WITH CONTRAST TECHNIQUE: Multidetector CT imaging of the abdomen and pelvis was performed using the standard protocol following bolus administration of intravenous contrast. RADIATION DOSE REDUCTION: This exam was performed according to the departmental dose-optimization program which includes automated exposure control, adjustment of the mA and/or kV according to patient size and/or use of iterative reconstruction technique. CONTRAST:  129m OMNIPAQUE IOHEXOL 300 MG/ML  SOLN COMPARISON:  November 30, 2021 FINDINGS: Lower chest: No acute abnormality. Hepatobiliary: No focal liver abnormality is seen. No gallstones or gallbladder wall thickening. The common bile duct is dilated and measures 1.5 cm in diameter. Mild to moderate  severity central intrahepatic biliary dilatation is also seen. Pancreas: Diffuse atrophy of the pancreatic parenchyma is seen with subcentimeter parenchymal calcifications noted within the pancreatic head. Pancreatic ductal dilatation is noted (7.2 mm in diameter). Spleen: Normal in size without focal abnormality. Adrenals/Urinary Tract: Adrenal glands are unremarkable. Kidneys are normal in size, without renal calculi or hydronephrosis. Multiple stable, bilateral subcentimeter simple and indeterminate cysts are seen. The urinary bladder is unremarkable. Stomach/Bowel: Stomach is within normal limits. Appendix appears normal. No evidence of bowel wall thickening, distention, or inflammatory changes. Vascular/Lymphatic: Aortic atherosclerosis. No enlarged abdominal or pelvic lymph nodes. Reproductive: Status post hysterectomy. No adnexal masses. Other: No abdominal wall hernia or abnormality. No abdominopelvic ascites. Musculoskeletal: Postoperative changes are seen within the lumbar spine at the levels of L3, L4 and L5. IMPRESSION: 1. Findings consistent with chronic pancreatitis. 2. Stable, bilateral simple and indeterminate renal cysts. Correlation with nonemergent  renal ultrasound is recommended. This recommendation follows ACR consensus guidelines: Management of the Incidental Renal Mass on CT: A White Paper of the ACR Incidental Findings Committee. J Am Coll Radiol [phone]. 3. Stable common bile duct and pancreatic ductal dilatation. Correlation with MRCP is recommended. 4. Postoperative changes within the lumbar spine. 5. Aortic atherosclerosis. Aortic Atherosclerosis (ICD10-I70.0). Electronically Signed   By: Virgina Norfolk M.D.   On: 02/04/2022 22:29   DG Chest 2 View  Result Date: 02/04/2022 CLINICAL DATA:  Chest pain. EXAM: CHEST - 2 VIEW COMPARISON:  Chest radiograph dated 09/26/2021. FINDINGS: Background of emphysema and chronic interstitial coarsening. No focal consolidation, pleural  effusion, pneumothorax. Right upper lobe scarring stable cardiomediastinal silhouette. Atherosclerotic calcification of the aortic arch. Thoracic spine stimulator wires. No acute osseous pathology. IMPRESSION: 1. No acute cardiopulmonary process. 2. Emphysema. Electronically Signed   By: Anner Crete M.D.   On: 02/04/2022 22:27    Irwin Brakeman, MD  [facility]  If 7PM-7AM, please contact night-coverage [email or web address] Password [email or web address] 02/08/2022, 11:41 AM   LOS: 3 days

## 2022-02-08 NOTE — Progress Notes (Signed)
Patient called out for pain medications. Provided as ordered.

## 2022-02-08 NOTE — Progress Notes (Signed)
  Subjective: Patient states that she is still having some abdominal pain, slightly improved today. Still having some nausea that she requested phenergan for which worked better. Had an episode of emesis last night. Denies feeling feverish or having chills. No BMs this morning, was able to tolerate some of her liquid breakfast.   Objective: Vital signs in last 24 hours: Temp:  [98.1 F (36.7 C)-98.6 F (37 C)] 98.6 F (37 C) (12/06 0323) Pulse Rate:  [75-85] 83 (12/06 0712) Resp:  [14-16] 14 (12/06 0712) BP: (129-146)/(57-76) 144/57 (12/06 0323) SpO2:  [99 %-100 %] 99 % (12/06 0712) Last BM Date : 02/04/22 General:   Alert and oriented, pleasant Head:  Normocephalic and atraumatic. Eyes:  No icterus, sclera clear. Conjuctiva pink.  Mouth:  Without lesions, mucosa pink and moist.  Heart:  S1, S2 present, no murmurs noted.  Lungs: Clear to auscultation bilaterally, without wheezing, rales, or rhonchi.  Abdomen:  Bowel sounds present, soft,  non-distended.  Mild TTP of LUQ. No HSM or hernias noted. No rebound or guarding. No masses appreciated  Msk:  Symmetrical without gross deformities. Normal posture. Pulses:  Normal pulses noted. Extremities:  Without clubbing or edema. Neurologic:  Alert and  oriented x4;  grossly normal neurologically. Skin:  Warm and dry, intact without significant lesions.  Psych:  Alert and cooperative. Normal mood and affect.  Intake/Output from previous day: 12/05 0701 - 12/06 0700 In: 1641.2 [P.O.:280; I.V.:1261.2; IV Piggyback:100] Out: 500 [Urine:500] Intake/Output this shift: No intake/output data recorded.  Lab Results: Recent Labs    02/06/22 0507 02/07/22 0521  WBC 4.9 3.8*  HGB 10.9* 11.5*  HCT 36.1 38.0  PLT 222 224   BMET Recent Labs    02/06/22 0507 02/07/22 0521 02/08/22 0541  NA 135 137 136  K 3.1* 2.9* 3.7  CL 97* 98 102  CO2 25 19* 24  GLUCOSE 67* 70 96  BUN 8 6* <5*  CREATININE 0.64 0.76 0.60  CALCIUM 8.7* 8.6* 8.2*    LFT Recent Labs    02/06/22 0507  PROT 6.3*  ALBUMIN 3.2*  AST 19  ALT 11  ALKPHOS 98  BILITOT 1.0    Assessment: Jasmine [name] is a 66 y.o. 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 Presenting to the ED with abdominal pain, N/v.   Acute on chronic abdominal pain, nausea, vomiting: history of chronic pancreatitis. CT w/findings of chronic pancreatitis, stable CBD and pancreatic ductal dilatation. Lipase normal. LFTs normal. Not able to complete MRCP due to neurostimulator. She has been evaluated for EUS consideration but felt high risk in light of oxygen requirements and comorbidities. Patient hesitant to advance diet to clears yesterday though transitioned yesterday afternoon, she tolerated liquids this morning without worsening of abdominal pain or nausea.  Will need to Resume supplemental pancreatic enzymes with diet advancement. Will need CT pancreatic protocol as outpatient.   Plan: PPI BID Scheduled zofran/Phenergan PRN Continue supportive care Outpatient CT pancreatic protocol Advance diet slowly, continue clears today  Restart pancreatic enzymes with further advancement of diet    LOS: 3 days    02/08/2022, 9:14 AM   Doreatha Offer L. Alver Sorrow, MSN, APRN, AGNP-C Adult-Gerontology Nurse Practitioner [facility]

## 2022-02-09 DIAGNOSIS — R112 Nausea with vomiting, unspecified: Secondary | ICD-10-CM | POA: Diagnosis not present

## 2022-02-09 DIAGNOSIS — F1129 Opioid dependence with unspecified opioid-induced disorder: Secondary | ICD-10-CM

## 2022-02-09 DIAGNOSIS — K859 Acute pancreatitis without necrosis or infection, unspecified: Secondary | ICD-10-CM | POA: Diagnosis not present

## 2022-02-09 DIAGNOSIS — K861 Other chronic pancreatitis: Secondary | ICD-10-CM | POA: Diagnosis not present

## 2022-02-09 LAB — CBC
HCT: 38.7 % (ref 36.0–46.0)
Hemoglobin: 11.8 g/dL — ABNORMAL LOW (ref 12.0–15.0)
MCH: 32.4 pg (ref 26.0–34.0)
MCHC: 30.5 g/dL (ref 30.0–36.0)
MCV: 106.3 fL — ABNORMAL HIGH (ref 80.0–100.0)
Platelets: 232 10*3/uL (ref 150–400)
RBC: 3.64 MIL/uL — ABNORMAL LOW (ref 3.87–5.11)
RDW: 13.4 % (ref 11.5–15.5)
WBC: 3.5 10*3/uL — ABNORMAL LOW (ref 4.0–10.5)
nRBC: 0 % (ref 0.0–0.2)

## 2022-02-09 LAB — BASIC METABOLIC PANEL
Anion gap: 6 (ref 5–15)
BUN: <5 mg/dL — ABNORMAL LOW (ref 8–23)
CO2: 29 mmol/L (ref 22–32)
Calcium: 8.5 mg/dL — ABNORMAL LOW (ref 8.9–10.3)
Chloride: 101 mmol/L (ref 98–111)
Creatinine, Ser: 0.44 mg/dL (ref 0.44–1.00)
GFR, Estimated: >60 mL/min (ref >60)
Glucose, Bld: 117 mg/dL — ABNORMAL HIGH (ref 70–99)
Potassium: 4 mmol/L (ref 3.5–5.1)
Sodium: 136 mmol/L (ref 135–145)

## 2022-02-09 LAB — MAGNESIUM: Magnesium: 1.6 mg/dL — ABNORMAL LOW (ref 1.7–2.4)

## 2022-02-09 MED ORDER — PANCRELIPASE (LIP-PROT-AMYL) 12000-38000 UNITS PO CPEP: 36000.0000 [IU] | ORAL_CAPSULE | ORAL | Status: DC | PRN

## 2022-02-09 MED ORDER — MAGNESIUM SULFATE 4 GM/100ML IV SOLN
4.0000 g | Freq: Once | INTRAVENOUS | Status: AC
  Administered 2022-02-09: 4 g via INTRAVENOUS
  Filled 2022-02-09: qty 100

## 2022-02-09 MED ORDER — PANCRELIPASE (LIP-PROT-AMYL) 12000-38000 UNITS PO CPEP
72000.0000 [IU] | ORAL_CAPSULE | Freq: Three times a day (TID) | ORAL | Status: DC
  Administered 2022-02-09 – 2022-02-14 (×12): 72000 [IU] via ORAL
  Filled 2022-02-09 (×14): qty 6

## 2022-02-09 MED ORDER — ENSURE ENLIVE PO LIQD
237.0000 mL | Freq: Three times a day (TID) | ORAL | Status: DC
  Administered 2022-02-09 – 2022-02-13 (×13): 237 mL via ORAL

## 2022-02-09 NOTE — Progress Notes (Signed)
Patient complained of abdominal pain throughout the night requesting PRN medication every 3 hours. She also reported feeling nauseated but no vomiting.

## 2022-02-09 NOTE — Progress Notes (Signed)
Gastroenterology Progress Note   Referring Provider: No ref. provider found Primary Care Physician:  Janora Norlander, DO Primary Gastroenterologist:  Garfield Cornea, MD   Patient ID: Jasmine [name]; [id number]; 1955/09/04   Subjective:    Still with abdominal pain but less nausea. Last episode of vomiting two nights ago. +flatus. She is tolerating clear liquids and requesting full liquids.   Objective:   Vital signs in last 24 hours: Temp:  [98.2 F (36.8 C)-98.9 F (37.2 C)] 98.4 F (36.9 C) (12/07 0436) Pulse Rate:  [78-91] 85 (12/07 0717) Resp:  [16-19] 16 (12/07 0717) BP: (104-159)/(58-83) 159/83 (12/07 0436) SpO2:  [84 %-100 %] 100 % (12/07 0717) Last BM Date : 02/04/22 General:   Alert,   pleasant and cooperative in NAD Head:  Normocephalic and atraumatic. Eyes:  Sclera clear, no icterus.  Abdomen:  Soft,  nondistended. Mild to moderate epigastric tendernessNormal bowel sounds, without guarding, and without rebound.   Extremities:  Without clubbing, deformity or edema. Neurologic:  Alert and  oriented x4;  grossly normal neurologically. Skin:  Intact without significant lesions or rashes. Psych:  Alert and cooperative. Normal mood and affect.  Intake/Output from previous day: 12/06 0701 - 12/07 0700 In: 2203.6 [P.O.:1200; I.V.:1003.6] Out: 2900 [Urine:2900] Intake/Output this shift: Total I/O In: 2671.8 [P.O.:120; I.V.:2551.8] Out: -   Lab Results: CBC Recent Labs    02/07/22 0521 02/09/22 0549  WBC 3.8* 3.5*  HGB 11.5* 11.8*  HCT 38.0 38.7  MCV 108.3* 106.3*  PLT 224 232   BMET Recent Labs    02/07/22 0521 02/08/22 0541 02/09/22 0549  NA 137 136 136  K 2.9* 3.7 4.0  CL 98 102 101  CO2 19* 24 29  GLUCOSE 70 96 117*  BUN 6* <5* <5*  CREATININE 0.76 0.60 0.44  CALCIUM 8.6* 8.2* 8.5*   LFTs No results for input(s): "BILITOT", "BILIDIR", "IBILI", "ALKPHOS", "AST", "ALT", "PROT", "ALBUMIN" in the last 72 hours. No results for input(s):  "LIPASE" in the last 72 hours. PT/INR No results for input(s): "LABPROT", "INR" in the last 72 hours.       Imaging Studies: CT ABDOMEN PELVIS W CONTRAST  Result Date: 02/04/2022 CLINICAL DATA:  Abdominal pain with nausea and vomiting. EXAM: CT ABDOMEN AND PELVIS WITH CONTRAST TECHNIQUE: Multidetector CT imaging of the abdomen and pelvis was performed using the standard protocol following bolus administration of intravenous contrast. RADIATION DOSE REDUCTION: This exam was performed according to the departmental dose-optimization program which includes automated exposure control, adjustment of the mA and/or kV according to patient size and/or use of iterative reconstruction technique. CONTRAST:  127mL OMNIPAQUE IOHEXOL 300 MG/ML  SOLN COMPARISON:  November 30, 2021 FINDINGS: Lower chest: No acute abnormality. Hepatobiliary: No focal liver abnormality is seen. No gallstones or gallbladder wall thickening. The common bile duct is dilated and measures 1.5 cm in diameter. Mild to moderate severity central intrahepatic biliary dilatation is also seen. Pancreas: Diffuse atrophy of the pancreatic parenchyma is seen with subcentimeter parenchymal calcifications noted within the pancreatic head. Pancreatic ductal dilatation is noted (7.2 mm in diameter). Spleen: Normal in size without focal abnormality. Adrenals/Urinary Tract: Adrenal glands are unremarkable. Kidneys are normal in size, without renal calculi or hydronephrosis. Multiple stable, bilateral subcentimeter simple and indeterminate cysts are seen. The urinary bladder is unremarkable. Stomach/Bowel: Stomach is within normal limits. Appendix appears normal. No evidence of bowel wall thickening, distention, or inflammatory changes. Vascular/Lymphatic: Aortic atherosclerosis. No enlarged abdominal or pelvic lymph nodes. Reproductive: Status post  hysterectomy. No adnexal masses. Other: No abdominal wall hernia or abnormality. No abdominopelvic ascites.  Musculoskeletal: Postoperative changes are seen within the lumbar spine at the levels of L3, L4 and L5. IMPRESSION: 1. Findings consistent with chronic pancreatitis. 2. Stable, bilateral simple and indeterminate renal cysts. Correlation with nonemergent renal ultrasound is recommended. This recommendation follows ACR consensus guidelines: Management of the Incidental Renal Mass on CT: A White Paper of the ACR Incidental Findings Committee. J Am Coll Radiol [phone]. 3. Stable common bile duct and pancreatic ductal dilatation. Correlation with MRCP is recommended. 4. Postoperative changes within the lumbar spine. 5. Aortic atherosclerosis. Aortic Atherosclerosis (ICD10-I70.0). Electronically Signed   By: Virgina Norfolk M.D.   On: 02/04/2022 22:29   DG Chest 2 View  Result Date: 02/04/2022 CLINICAL DATA:  Chest pain. EXAM: CHEST - 2 VIEW COMPARISON:  Chest radiograph dated 09/26/2021. FINDINGS: Background of emphysema and chronic interstitial coarsening. No focal consolidation, pleural effusion, pneumothorax. Right upper lobe scarring stable cardiomediastinal silhouette. Atherosclerotic calcification of the aortic arch. Thoracic spine stimulator wires. No acute osseous pathology. IMPRESSION: 1. No acute cardiopulmonary process. 2. Emphysema. Electronically Signed   By: Anner Crete M.D.   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 Presenting to the ED with abdominal pain, N/V.    Acute on chronic abdominal pain, nausea, vomiting: history of chronic pancreatitis. CT w/findings of chronic pancreatitis, stable CBD and pancreatic ductal dilatation. Lipase  normal. LFTs normal. Not able to complete MRCP due to neurostimulator. She has been evaluated for EUS consideration but felt high risk in light of oxygen requirements and comorbidities. She is reported less nausea and wanting full liquids. She continues to have epigastric pain but not worse with meals.     Plan:   PPI BID Continue scheduled Zofran.  Full liquid diet. Patient requesting Ensure Enlive TID. Add pancreatic enzymes. Outpatient pancreatic protocol CT in 1-2 months   LOS: 4 days   Laureen Ochs. Bernarda Caffey [facility] [phone] 12/7/202310:38 AM

## 2022-02-09 NOTE — Progress Notes (Signed)
PROGRESS NOTE  Jasmine [name] HEN:[id number] DOB: 09/05/1955 DOA: 02/04/2022 PCP: Janora Norlander, DO  Brief History:  66 year old female with history of COPD, chronic respiratory failure on 6-7 L, tobacco abuse, opioid dependence secondary to chronic back pain, hypertension, hyperlipidemia,nephrolithiasis, colon polyps (TA's), GERD, idiopathic pancreatitis, abnormal pancreas imaging with dilated duct, chronically dilated bile duct, presents with a 4-day history of abdominal pain more so in her lower abdomen with associated nausea and vomiting.  The patient denies any new medications.  She denies any alcohol or illicit substances.  She has not been taking any NSAIDs.  She states that she has not been able to tolerate any of her oral medications because of her emesis.  She has not had any fevers, chills, chest pain, hematemesis, hematochezia, melena.  She states that her breathing and coughing are the same as usual.  She denies any worsening lower extremity edema.  She denies any recent sick contacts or travels.  She has had some loose stools.  Notably, the patient has been following Dr. Rush Landmark for her chronic pancreatitis.  She last saw him on 01/03/2022.  At that visit it was felt best to defer elective EUS secondary to her chronic respiratory failure and risks involved with conscious sedation.  Plans were to obtain a CT of the pancreas with pancreatic protocol.  She was restarted on pancreatic enzyme supplementation.  In the ED, the patient had low-grade temperature of 99.3 F.  She was mildly tachycardic in 110 range and hemodynamically stable.  Oxygen saturation was 100% on 6 L.  WBC 4.8, hemoglobin 12.0, platelets 227,000.  Sodium 141, potassium 3.8, bicarbonate 28, serum creatinine 0.58.  AST 18, ALT 13, alk phosphatase 111, total bilirubin 1.0.  Troponin 8>>9.  EKG shows sinus rhythm with nonspecific ST changes.  The patient was started on IV fluids and IV opioids for her  intractable nausea and vomiting.  She was admitted for further workup and treatment.   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  No bowel wall thickening.  Stable CBD and pancreatic ductal dilatation. -Patient follows Dr. Rush Landmark -Restart Creon today     Chronic respiratory failure with hypoxia -She is chronically on 6 to 7 L -Stable presently   Opioid dependence -PDMP reviewed -Patient receives OxyContin 15 mg, #60 monthly -Patient receives Percocet 10/325 #150 monthly -No red flags   COPD -Stable presently without exacerbation -Continue bronchodilators--yupelri, brovana, pulmicort   Tobacco abuse -Tobacco cessation discussed   Essential hypertension -Restarted amlodipine and Zebeta -IV hydralazine prn SBP >180   Hyperlipidemia -Restart statin when able to tolerate p.o. better    Hypokalemia -added KCl to IVF -repleted   Hypomagnesemia - repleted     Family Communication:   no Family at bedside   Consultants:  GI   Code Status:  FULL   DVT Prophylaxis:  [facility] Lovenox     Procedures: As Listed in Progress Note Above   Antibiotics: None   Subjective: Pt having more abdominal pain today.     Objective: Vitals:   02/08/22 2146 02/09/22 0436 02/09/22  0717 02/09/22 1300  BP: 114/75 (!) 159/83  (!) 108/58  Pulse: 78 91 85 71  Resp: _0 Temp: 98.2 F (36.8 C) 98.4 F (36.9 C)  97.9 F (36.6 C)  TempSrc: Oral Oral  Oral   SpO2: 100% 100% 100% 100%  Weight:      Height:        Intake/Output Summary (Last 24 hours) at 02/09/2022 1616 Last data filed at 02/09/2022 1300 Gross per 24 hour  Intake 3391.75 ml  Output 2350 ml  Net 1041.75 ml   Weight change:  Exam:  General:  Pt is alert, follows commands appropriately, not in acute distress HEENT: No icterus, No thrush, No neck mass, [place]/AT Cardiovascular: normal S1/S2, no rubs, no gallops Respiratory: bibasilar rales. No wheeze.  Diminshed BS bilater Abdomen: soft, normal BS, mild epigastric TTP, no guarding.  Extremities: No edema, No lymphangitis, No petechiae, No rashes, no synovitis  Data Reviewed: I have personally reviewed following labs and imaging studies Basic Metabolic Panel: Recent Labs  Lab 02/04/22 2008 02/06/22 0507 02/07/22 0521 02/08/22 0541 02/09/22 0549  NA 141 135 137 136 136  K 3.8 3.1* 2.9* 3.7 4.0  CL 101 97* 98 102 101  CO2 28 25 19* 24 29  GLUCOSE 105* 67* 70 96 117*  BUN 7* 8 6* <5* <5*  CREATININE 0.58 0.64 0.76 0.60 0.44  CALCIUM 9.1 8.7* 8.6* 8.2* 8.5*  MG  --   --  1.6* 1.9 1.6*   Liver Function Tests: Recent Labs  Lab 02/04/22 2008 02/06/22 0507  AST 18 19  ALT 13 11  ALKPHOS 111 98  BILITOT 1.0 1.0  PROT 7.3 6.3*  ALBUMIN 3.6 3.2*   Recent Labs  Lab 02/04/22 2008  LIPASE 25   No results for input(s): "AMMONIA" in the last 168 hours. Coagulation Profile: No results for input(s): "INR", "PROTIME" in the last 168 hours. CBC: Recent Labs  Lab 02/04/22 2008 02/06/22 0507 02/07/22 0521 02/09/22 0549  WBC 4.8 4.9 3.8* 3.5*  HGB 12.0 10.9* 11.5* 11.8*  HCT 39.8 36.1 38.0 38.7  MCV 108.4* 108.7* 108.3* 106.3*  PLT 227 222 224 232   Cardiac Enzymes: No results for input(s): "CKTOTAL", "CKMB", "CKMBINDEX", "TROPONINI" in the last 168 hours. BNP: Invalid input(s): "POCBNP" CBG: No results for input(s): "GLUCAP" in the last 168 hours. HbA1C: No results for input(s): "HGBA1C" in the last 72  hours. Urine analysis:    Component Value Date/Time   COLORURINE YELLOW 02/04/2022 2141   APPEARANCEUR CLEAR 02/04/2022 2141   APPEARANCEUR Clear 06/07/2021 1524   LABSPEC 1.014 02/04/2022 2141   PHURINE 6.0 02/04/2022 2141   GLUCOSEU NEGATIVE 02/04/2022 2141   HGBUR NEGATIVE 02/04/2022 2141   [place] NEGATIVE 02/04/2022 2141   BILIRUBINUR Negative 06/07/2021 1524   KETONESUR 20 (A) 02/04/2022 2141   PROTEINUR 100 (A) 02/04/2022 2141   NITRITE NEGATIVE 02/04/2022 2141   LEUKOCYTESUR NEGATIVE 02/04/2022 2141   Recent Results (from the past 240 hour(s))  SARS Coronavirus 2 by RT PCR (hospital order, performed in [facility] hospital lab) *cepheid single result test* Anterior Nasal Swab     Status: None   Collection Time: 02/05/22  2:52 PM   Specimen: Anterior Nasal Swab  Result Value Ref Range Status   SARS Coronavirus 2 by RT PCR NEGATIVE NEGATIVE Final    Comment: (NOTE) SARS-CoV-2 target nucleic acids are NOT DETECTED.  The SARS-CoV-2 RNA is generally detectable in upper and lower respiratory specimens during the  acute phase of infection. The lowest concentration of SARS-CoV-2 viral copies this assay can detect is 250 copies / mL. A negative result does not preclude SARS-CoV-2 infection and should not be used as the sole basis for treatment or other patient management decisions.  A negative result may occur with improper specimen collection / handling, submission of specimen other than nasopharyngeal swab, presence of viral mutation(s) within the areas targeted by this assay, and inadequate number of viral copies (<250 copies / mL). A negative result must be combined with clinical observations, patient history, and epidemiological information.  Fact Sheet for Patients:   https://www.patel.info/  Fact Sheet for Healthcare Providers: https://hall.com/  This test is not yet approved or  cleared by the [place] FDA and has  been authorized for detection and/or diagnosis of SARS-CoV-2 by FDA under an Emergency Use Authorization (EUA).  This EUA will remain in effect (meaning this test can be used) for the duration of the COVID-19 declaration under Section 564(b)(1) of the Act, 21 U.S.C. section 360bbb-3(b)(1), unless the authorization is terminated or revoked sooner.  Performed at [facility], [address] [place], [place] [address]      Scheduled Meds:  amLODipine  10 mg Oral Daily   arformoterol  15 mcg Nebulization BID   bisoprolol  2.5 mg Oral Daily   budesonide (PULMICORT) nebulizer solution  0.5 mg Nebulization BID   enoxaparin (LOVENOX) injection  40 mg Subcutaneous Q24H   feeding supplement  237 mL Oral TID BM   influenza vaccine adjuvanted  0.5 mL Intramuscular Tomorrow-1000   lipase/protease/amylase  72,000 Units Oral TID WC   ondansetron (ZOFRAN) IV  4 mg Intravenous Q6H   pantoprazole (PROTONIX) IV  40 mg Intravenous Q12H   revefenacin  175 mcg Nebulization Daily   traZODone  300 mg Oral QHS   Continuous Infusions:  0.9 % NaCl with KCl 40 mEq / L 100 mL/hr at 02/09/22 1003   promethazine (PHENERGAN) injection (IM or IVPB) Stopped (02/08/22 0200)    Procedures/Studies: CT ABDOMEN PELVIS W CONTRAST  Result Date: 02/04/2022 CLINICAL DATA:  Abdominal pain with nausea and vomiting. EXAM: CT ABDOMEN AND PELVIS WITH CONTRAST TECHNIQUE: Multidetector CT imaging of the abdomen and pelvis was performed using the standard protocol following bolus administration of intravenous contrast. RADIATION DOSE REDUCTION: This exam was performed according to the departmental dose-optimization program which includes automated exposure control, adjustment of the mA and/or kV according to patient size and/or use of iterative reconstruction technique. CONTRAST:  150m OMNIPAQUE IOHEXOL 300 MG/ML  SOLN COMPARISON:  November 30, 2021 FINDINGS: Lower chest: No acute abnormality. Hepatobiliary: No focal liver  abnormality is seen. No gallstones or gallbladder wall thickening. The common bile duct is dilated and measures 1.5 cm in diameter. Mild to moderate severity central intrahepatic biliary dilatation is also seen. Pancreas: Diffuse atrophy of the pancreatic parenchyma is seen with subcentimeter parenchymal calcifications noted within the pancreatic head. Pancreatic ductal dilatation is noted (7.2 mm in diameter). Spleen: Normal in size without focal abnormality. Adrenals/Urinary Tract: Adrenal glands are unremarkable. Kidneys are normal in size, without renal calculi or hydronephrosis. Multiple stable, bilateral subcentimeter simple and indeterminate cysts are seen. The urinary bladder is unremarkable. Stomach/Bowel: Stomach is within normal limits. Appendix appears normal. No evidence of bowel wall thickening, distention, or inflammatory changes. Vascular/Lymphatic: Aortic atherosclerosis. No enlarged abdominal or pelvic lymph nodes. Reproductive: Status post hysterectomy. No adnexal masses. Other: No abdominal wall hernia or abnormality. No abdominopelvic ascites. Musculoskeletal: Postoperative changes are seen  within the lumbar spine at the levels of L3, L4 and L5. IMPRESSION: 1. Findings consistent with chronic pancreatitis. 2. Stable, bilateral simple and indeterminate renal cysts. Correlation with nonemergent renal ultrasound is recommended. This recommendation follows ACR consensus guidelines: Management of the Incidental Renal Mass on CT: A White Paper of the ACR Incidental Findings Committee. J Am Coll Radiol [phone]. 3. Stable common bile duct and pancreatic ductal dilatation. Correlation with MRCP is recommended. 4. Postoperative changes within the lumbar spine. 5. Aortic atherosclerosis. Aortic Atherosclerosis (ICD10-I70.0). Electronically Signed   By: Virgina Norfolk M.D.   On: 02/04/2022 22:29   DG Chest 2 View  Result Date: 02/04/2022 CLINICAL DATA:  Chest pain. EXAM: CHEST - 2 VIEW  COMPARISON:  Chest radiograph dated 09/26/2021. FINDINGS: Background of emphysema and chronic interstitial coarsening. No focal consolidation, pleural effusion, pneumothorax. Right upper lobe scarring stable cardiomediastinal silhouette. Atherosclerotic calcification of the aortic arch. Thoracic spine stimulator wires. No acute osseous pathology. IMPRESSION: 1. No acute cardiopulmonary process. 2. Emphysema. Electronically Signed   By: Anner Crete M.D.   On: 02/04/2022 22:27    Irwin Brakeman, MD  [facility]  If 7PM-7AM, please contact night-coverage [email or web address] Password [email or web address] 02/09/2022, 4:16 PM   LOS: 4 days

## 2022-02-10 DIAGNOSIS — K859 Acute pancreatitis without necrosis or infection, unspecified: Secondary | ICD-10-CM | POA: Diagnosis not present

## 2022-02-10 DIAGNOSIS — K861 Other chronic pancreatitis: Secondary | ICD-10-CM | POA: Diagnosis not present

## 2022-02-10 LAB — BASIC METABOLIC PANEL
Anion gap: 5 (ref 5–15)
BUN: 7 mg/dL — ABNORMAL LOW (ref 8–23)
CO2: 29 mmol/L (ref 22–32)
Calcium: 8.4 mg/dL — ABNORMAL LOW (ref 8.9–10.3)
Chloride: 103 mmol/L (ref 98–111)
Creatinine, Ser: 0.53 mg/dL (ref 0.44–1.00)
GFR, Estimated: >60 mL/min (ref >60)
Glucose, Bld: 101 mg/dL — ABNORMAL HIGH (ref 70–99)
Potassium: 4.6 mmol/L (ref 3.5–5.1)
Sodium: 137 mmol/L (ref 135–145)

## 2022-02-10 LAB — MAGNESIUM: Magnesium: 2.6 mg/dL — ABNORMAL HIGH (ref 1.7–2.4)

## 2022-02-10 LAB — CBC
HCT: 35.1 % — ABNORMAL LOW (ref 36.0–46.0)
Hemoglobin: 10.5 g/dL — ABNORMAL LOW (ref 12.0–15.0)
MCH: 32.6 pg (ref 26.0–34.0)
MCHC: 29.9 g/dL — ABNORMAL LOW (ref 30.0–36.0)
MCV: 109 fL — ABNORMAL HIGH (ref 80.0–100.0)
Platelets: 214 10*3/uL (ref 150–400)
RBC: 3.22 MIL/uL — ABNORMAL LOW (ref 3.87–5.11)
RDW: 13.5 % (ref 11.5–15.5)
WBC: 3.9 10*3/uL — ABNORMAL LOW (ref 4.0–10.5)
nRBC: 0 % (ref 0.0–0.2)

## 2022-02-10 MED ORDER — SODIUM CHLORIDE 0.9 % IV SOLN: INTRAVENOUS | Status: DC

## 2022-02-10 MED ORDER — MORPHINE SULFATE (PF) 2 MG/ML IV SOLN
2.0000 mg | INTRAVENOUS | Status: DC | PRN
  Administered 2022-02-10 – 2022-02-11 (×6): 4 mg via INTRAVENOUS
  Administered 2022-02-11: 2 mg via INTRAVENOUS
  Filled 2022-02-10 (×7): qty 2

## 2022-02-10 NOTE — Progress Notes (Signed)
Gastroenterology Progress Note   Referring Provider: No ref. provider found Primary Care Physician:  Janora Norlander, DO Primary Gastroenterologist:  Cristopher Estimable.Rourk, MD  Patient ID: Jasmine [name]; [id number]; 28-Nov-1955    Subjective   Continues to have abdominal pain, requiring pain medication about every 3 hours.  Pain not worsened with food.  Still with some present nausea but tolerable.  Denies any vomiting. + flatus.  No diarrhea.  Thus far seems to be tolerating full liquids.   Objective   Vital signs in last 24 hours Temp:  [97.8 F (36.6 C)-98.3 F (36.8 C)] 98.3 F (36.8 C) (12/08 1214) Pulse Rate:  [69-86] 86 (12/08 1214) Resp:  [17-19] 18 (12/08 1214) BP: (104-118)/(55-75) 104/75 (12/08 1214) SpO2:  [99 %-100 %] 100 % (12/08 1214) Last BM Date : 02/04/22  Physical Exam General:   Alert and oriented, pleasant, NAD Head:  Normocephalic and atraumatic. Eyes:  No icterus, sclera clear. Conjuctiva pink.  Abdomen:  Bowel sounds present, soft, non-distended. TTP to epigastrium. No HSM or hernias noted. No rebound or guarding. No masses appreciated  Neurologic:  Alert and  oriented x4;  grossly normal neurologically. Psych:  Alert and cooperative. Normal mood and affect.  Intake/Output from previous day: 12/07 0701 - 12/08 0700 In: 4516.3 [P.O.:1200; I.V.:3316.3] Out: 2300 [Urine:2300] Intake/Output this shift: Total I/O In: 240 [P.O.:240] Out: -   Lab Results  Recent Labs    02/09/22 0549 02/10/22 0457  WBC 3.5* 3.9*  HGB 11.8* 10.5*  HCT 38.7 35.1*  PLT 232 214   BMET Recent Labs    02/08/22 0541 02/09/22 0549 02/10/22 0457  NA 136 136 137  K 3.7 4.0 4.6  CL 102 101 103  CO2 24 29 29   GLUCOSE 96 117* 101*  BUN <5* <5* 7*  CREATININE 0.60 0.44 0.53  CALCIUM 8.2* 8.5* 8.4*   LFT No results for input(s): "PROT", "ALBUMIN", "AST", "ALT", "ALKPHOS", "BILITOT", "BILIDIR", "IBILI" in the last 72 hours. PT/INR No results for input(s):  "LABPROT", "INR" in the last 72 hours. Hepatitis Panel No results for input(s): "HEPBSAG", "HCVAB", "HEPAIGM", "HEPBIGM" in the last 72 hours.   Studies/Results CT ABDOMEN PELVIS W CONTRAST  Result Date: 02/04/2022 CLINICAL DATA:  Abdominal pain with nausea and vomiting. EXAM: CT ABDOMEN AND PELVIS WITH CONTRAST TECHNIQUE: Multidetector CT imaging of the abdomen and pelvis was performed using the standard protocol following bolus administration of intravenous contrast. RADIATION DOSE REDUCTION: This exam was performed according to the departmental dose-optimization program which includes automated exposure control, adjustment of the mA and/or kV according to patient size and/or use of iterative reconstruction technique. CONTRAST:  170mL OMNIPAQUE IOHEXOL 300 MG/ML  SOLN COMPARISON:  November 30, 2021 FINDINGS: Lower chest: No acute abnormality. Hepatobiliary: No focal liver abnormality is seen. No gallstones or gallbladder wall thickening. The common bile duct is dilated and measures 1.5 cm in diameter. Mild to moderate severity central intrahepatic biliary dilatation is also seen. Pancreas: Diffuse atrophy of the pancreatic parenchyma is seen with subcentimeter parenchymal calcifications noted within the pancreatic head. Pancreatic ductal dilatation is noted (7.2 mm in diameter). Spleen: Normal in size without focal abnormality. Adrenals/Urinary Tract: Adrenal glands are unremarkable. Kidneys are normal in size, without renal calculi or hydronephrosis. Multiple stable, bilateral subcentimeter simple and indeterminate cysts are seen. The urinary bladder is unremarkable. Stomach/Bowel: Stomach is within normal limits. Appendix appears normal. No evidence of bowel wall thickening, distention, or inflammatory changes. Vascular/Lymphatic: Aortic atherosclerosis. No enlarged abdominal or pelvic  lymph nodes. Reproductive: Status post hysterectomy. No adnexal masses. Other: No abdominal wall hernia or abnormality.  No abdominopelvic ascites. Musculoskeletal: Postoperative changes are seen within the lumbar spine at the levels of L3, L4 and L5. IMPRESSION: 1. Findings consistent with chronic pancreatitis. 2. Stable, bilateral simple and indeterminate renal cysts. Correlation with nonemergent renal ultrasound is recommended. This recommendation follows ACR consensus guidelines: Management of the Incidental Renal Mass on CT: A White Paper of the ACR Incidental Findings Committee. J Am Coll Radiol [phone]. 3. Stable common bile duct and pancreatic ductal dilatation. Correlation with MRCP is recommended. 4. Postoperative changes within the lumbar spine. 5. Aortic atherosclerosis. Aortic Atherosclerosis (ICD10-I70.0). Electronically Signed   By: Virgina Norfolk M.D.   On: 02/04/2022 22:29   DG Chest 2 View  Result Date: 02/04/2022 CLINICAL DATA:  Chest pain. EXAM: CHEST - 2 VIEW COMPARISON:  Chest radiograph dated 09/26/2021. FINDINGS: Background of emphysema and chronic interstitial coarsening. No focal consolidation, pleural effusion, pneumothorax. Right upper lobe scarring stable cardiomediastinal silhouette. Atherosclerotic calcification of the aortic arch. Thoracic spine stimulator wires. No acute osseous pathology. IMPRESSION: 1. No acute cardiopulmonary process. 2. Emphysema. Electronically Signed   By: Anner Crete M.D.   On: 02/04/2022 22:27    Assessment  66 y.o. female with a history of COPD, pancreatitis in 2020 (unclear etiology, dilated pancreatic duct/pancreas insufficiency with abnormal pancreatic elastase 09/22/2020), chronically dilated CBD likely secondary to opioid use, C. 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  Acute on chronic abdominal pain, nausea, vomiting: History of chronic pancreatitis.  CT dismission with  findings of chronic pancreatitis, stable CBD and pancreatic ductal dilation.  Lipase and LFTs within normal limits.  Unable to complete MRCP due to presence of neurostimulator.  Previously seen evaluated for EUS by Dr. Rush Landmark but felt like given increased oxygen requirements and comorbidities she would be high risk.  Recommended surveillance CT scan in 3 months from her last visit.  Continues to tolerate full liquid diet without difficulty, taking pancreatic enzymes as recommended.  Would like to continue to take diet advancement slow.  If she has improvement in nausea or abdominal pain tomorrow, may consider advancing to soft diet.   Plan / Recommendations  PPI twice daily Continue Zofran scheduled and phenergan PRN Continue full liquid diet.  Continue pancreatic enzymes Outpatient CT with pancreatic protocol in 1-2 months.     LOS: 5 days   02/10/2022, 12:59 PM  Venetia Night, MSN, FNP-BC, AGACNP-BC [facility]

## 2022-02-10 NOTE — Care Management Important Message (Signed)
Important Message  Patient Details  Name: Jasmine [name] MRN: [id number] Date of Birth: 02/01/1956   Medicare Important Message Given:  Yes     [name] 02/10/2022, 1:12 PM

## 2022-02-10 NOTE — Progress Notes (Signed)
PROGRESS NOTE  Jasmine [name] VZD:[id number] DOB: [birth year] DOA: 02/04/2022 PCP: Janora Norlander, DO  Brief History:  66 year old female with history of COPD, chronic respiratory failure on 6-7 L, tobacco abuse, opioid dependence secondary to chronic back pain, hypertension, hyperlipidemia,nephrolithiasis, colon polyps (TA's), GERD, idiopathic pancreatitis, abnormal pancreas imaging with dilated duct, chronically dilated bile duct, presents with a 4-day history of abdominal pain more so in her lower abdomen with associated nausea and vomiting.  The patient denies any new medications.  She denies any alcohol or illicit substances.  She has not been taking any NSAIDs.  She states that she has not been able to tolerate any of her oral medications because of her emesis.  She has not had any fevers, chills, chest pain, hematemesis, hematochezia, melena.  She states that her breathing and coughing are the same as usual.  She denies any worsening lower extremity edema.  She denies any recent sick contacts or travels.  She has had some loose stools.  Notably, the patient has been following Dr. Rush Landmark for her chronic pancreatitis.  She last saw him on 01/03/2022.  At that visit it was felt best to defer elective EUS secondary to her chronic respiratory failure and risks involved with conscious sedation.  Plans were to obtain a CT of the pancreas with pancreatic protocol.  She was restarted on pancreatic enzyme supplementation.  In the ED, the patient had low-grade temperature of 99.3 F.  She was mildly tachycardic in 110 range and hemodynamically stable.  Oxygen saturation was 100% on 6 L.  WBC 4.8, hemoglobin 12.0, platelets 227,000.  Sodium 141, potassium 3.8, bicarbonate 28, serum creatinine 0.58.  AST 18, ALT 13, alk phosphatase 111, total bilirubin 1.0.  Troponin 8>>9.  EKG shows sinus rhythm with nonspecific ST changes.  The patient was started on IV fluids and IV opioids for her  intractable nausea and vomiting.  She was admitted for further workup and treatment.   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  No bowel wall thickening.  Stable CBD and pancreatic ductal dilatation. -Patient follows Dr. Rush Landmark -Restarted Creon 12/7    Chronic respiratory failure with hypoxia -She is chronically on 6 to 7 L -Stable presently   Opioid dependence -PDMP reviewed -Patient receives OxyContin 15 mg, #60 monthly -Patient receives Percocet 10/325 #150 monthly -No red flags   COPD -Stable presently without exacerbation -Continue bronchodilators--yupelri, brovana, pulmicort   Tobacco abuse -Tobacco cessation discussed   Essential hypertension -Restarted amlodipine and Zebeta -IV hydralazine prn SBP >180   Hyperlipidemia -Restart statin when able to tolerate p.o. better    Hypokalemia -added KCl to IVF -repleted   Hypomagnesemia - repleted     Family Communication:   no Family at bedside   Consultants:  GI   Code Status:  FULL   DVT Prophylaxis:  [place] Lovenox     Procedures: As Listed in Progress Note Above   Antibiotics: None   Subjective: Pt reporting that she is able to tolerate the full liquids but  still having abdominal pain, requiring IV pain medication.      Objective: Vitals:   02/09/22 1300 02/09/22 2014 02/09/22 2020 02/10/22 0547  BP: (!) 108/58  118/62 (!) 113/55  Pulse: 71  69 71   Resp: _0 Temp: 97.9 F (36.6 C)  98.1 F (36.7 C) 97.8 F (36.6 C)  TempSrc: Oral   Oral  SpO2: 100% 99% 100% 100%  Weight:      Height:        Intake/Output Summary (Last 24 hours) at 02/10/2022 1016 Last data filed at 02/10/2022 0900 Gross per 24 hour  Intake 2084.59 ml  Output 2300 ml  Net -215.41 ml   Weight change:  Exam:  General:  Pt is alert, follows commands appropriately, not in acute distress HEENT: No icterus, No thrush, No neck mass, [place]/AT Cardiovascular: normal S1/S2, no rubs, no gallops Respiratory: bibasilar rales. No wheeze.  Diminshed BS bilater Abdomen: soft, normal BS, persistent epigastric TTP, no guarding.  Extremities: No edema, No lymphangitis, No petechiae, No rashes, no synovitis  Data Reviewed: I have personally reviewed following labs and imaging studies Basic Metabolic Panel: Recent Labs  Lab 02/06/22 0507 02/07/22 0521 02/08/22 0541 02/09/22 0549 02/10/22 0457  NA 135 137 136 136 137  K 3.1* 2.9* 3.7 4.0 4.6  CL 97* 98 102 101 103  CO2 25 19* _1 GLUCOSE 67* 70 96 117* 101*  BUN 8 6* <5* <5* 7*  CREATININE 0.64 0.76 0.60 0.44 0.53  CALCIUM 8.7* 8.6* 8.2* 8.5* 8.4*  MG  --  1.6* 1.9 1.6* 2.6*   Liver Function Tests: Recent Labs  Lab 02/04/22 2008 02/06/22 0507  AST 18 19  ALT 13 11  ALKPHOS 111 98  BILITOT 1.0 1.0  PROT 7.3 6.3*  ALBUMIN 3.6 3.2*   Recent Labs  Lab 02/04/22 2008  LIPASE 25   No results for input(s): "AMMONIA" in the last 168 hours. Coagulation Profile: No results for input(s): "INR", "PROTIME" in the last 168 hours. CBC: Recent Labs  Lab 02/04/22 2008 02/06/22 0507 02/07/22 0521 02/09/22 0549 02/10/22 0457  WBC 4.8 4.9 3.8* 3.5* 3.9*  HGB 12.0 10.9* 11.5* 11.8* 10.5*  HCT 39.8 36.1 38.0 38.7 35.1*  MCV 108.4* 108.7* 108.3* 106.3* 109.0*  PLT 227 222 224 232 214   Cardiac Enzymes: No results for input(s): "CKTOTAL", "CKMB", "CKMBINDEX", "TROPONINI" in the last 168  hours. BNP: Invalid input(s): "POCBNP" CBG: No results for input(s): "GLUCAP" in the last 168 hours. HbA1C: No results for input(s): "HGBA1C" in the last 72 hours. Urine analysis:    Component Value Date/Time   COLORURINE YELLOW 02/04/2022 2141   APPEARANCEUR CLEAR 02/04/2022 2141   APPEARANCEUR Clear 06/07/2021 1524   LABSPEC 1.014 02/04/2022 2141   PHURINE 6.0 02/04/2022 2141   GLUCOSEU NEGATIVE 02/04/2022 2141   HGBUR NEGATIVE 02/04/2022 2141   [place] NEGATIVE 02/04/2022 2141   BILIRUBINUR Negative 06/07/2021 1524   KETONESUR 20 (A) 02/04/2022 2141   PROTEINUR 100 (A) 02/04/2022 2141   NITRITE NEGATIVE 02/04/2022 2141   LEUKOCYTESUR NEGATIVE 02/04/2022 2141   Recent Results (from the past 240 hour(s))  SARS Coronavirus 2 by RT PCR (hospital order, performed in [facility] hospital lab) *cepheid single result test* Anterior Nasal Swab     Status: None   Collection Time: 02/05/22  2:52 PM   Specimen: Anterior Nasal Swab  Result Value Ref Range Status   SARS Coronavirus 2 by RT PCR NEGATIVE NEGATIVE Final  Comment: (NOTE) SARS-CoV-2 target nucleic acids are NOT DETECTED.  The SARS-CoV-2 RNA is generally detectable in upper and lower respiratory specimens during the acute phase of infection. The lowest concentration of SARS-CoV-2 viral copies this assay can detect is 250 copies / mL. A negative result does not preclude SARS-CoV-2 infection and should not be used as the sole basis for treatment or other patient management decisions.  A negative result may occur with improper specimen collection / handling, submission of specimen other than nasopharyngeal swab, presence of viral mutation(s) within the areas targeted by this assay, and inadequate number of viral copies (<250 copies / mL). A negative result must be combined with clinical observations, patient history, and epidemiological information.  Fact Sheet for Patients:    https://www.patel.info/  Fact Sheet for Healthcare Providers: https://hall.com/  This test is not yet approved or  cleared by the [place] FDA and has been authorized for detection and/or diagnosis of SARS-CoV-2 by FDA under an Emergency Use Authorization (EUA).  This EUA will remain in effect (meaning this test can be used) for the duration of the COVID-19 declaration under Section 564(b)(1) of the Act, 21 U.S.C. section 360bbb-3(b)(1), unless the authorization is terminated or revoked sooner.  Performed at [facility], [address], [place], [place] [address]      Scheduled Meds:  amLODipine  10 mg Oral Daily   arformoterol  15 mcg Nebulization BID   bisoprolol  2.5 mg Oral Daily   budesonide (PULMICORT) nebulizer solution  0.5 mg Nebulization BID   enoxaparin (LOVENOX) injection  40 mg Subcutaneous Q24H   feeding supplement  237 mL Oral TID BM   influenza vaccine adjuvanted  0.5 mL Intramuscular Tomorrow-1000   lipase/protease/amylase  72,000 Units Oral TID WC   ondansetron (ZOFRAN) IV  4 mg Intravenous Q6H   pantoprazole (PROTONIX) IV  40 mg Intravenous Q12H   revefenacin  175 mcg Nebulization Daily   traZODone  300 mg Oral QHS   Continuous Infusions:  0.9 % NaCl with KCl 40 mEq / L 75 mL/hr at 02/10/22 0237   promethazine (PHENERGAN) injection (IM or IVPB) Stopped (02/08/22 0200)    Procedures/Studies: CT ABDOMEN PELVIS W CONTRAST  Result Date: 02/04/2022 CLINICAL DATA:  Abdominal pain with nausea and vomiting. EXAM: CT ABDOMEN AND PELVIS WITH CONTRAST TECHNIQUE: Multidetector CT imaging of the abdomen and pelvis was performed using the standard protocol following bolus administration of intravenous contrast. RADIATION DOSE REDUCTION: This exam was performed according to the departmental dose-optimization program which includes automated exposure control, adjustment of the mA and/or kV according to patient size and/or use  of iterative reconstruction technique. CONTRAST:  167m OMNIPAQUE IOHEXOL 300 MG/ML  SOLN COMPARISON:  November 30, 2021 FINDINGS: Lower chest: No acute abnormality. Hepatobiliary: No focal liver abnormality is seen. No gallstones or gallbladder wall thickening. The common bile duct is dilated and measures 1.5 cm in diameter. Mild to moderate severity central intrahepatic biliary dilatation is also seen. Pancreas: Diffuse atrophy of the pancreatic parenchyma is seen with subcentimeter parenchymal calcifications noted within the pancreatic head. Pancreatic ductal dilatation is noted (7.2 mm in diameter). Spleen: Normal in size without focal abnormality. Adrenals/Urinary Tract: Adrenal glands are unremarkable. Kidneys are normal in size, without renal calculi or hydronephrosis. Multiple stable, bilateral subcentimeter simple and indeterminate cysts are seen. The urinary bladder is unremarkable. Stomach/Bowel: Stomach is within normal limits. Appendix appears normal. No evidence of bowel wall thickening, distention, or inflammatory changes. Vascular/Lymphatic: Aortic atherosclerosis. No enlarged abdominal or pelvic  lymph nodes. Reproductive: Status post hysterectomy. No adnexal masses. Other: No abdominal wall hernia or abnormality. No abdominopelvic ascites. Musculoskeletal: Postoperative changes are seen within the lumbar spine at the levels of L3, L4 and L5. IMPRESSION: 1. Findings consistent with chronic pancreatitis. 2. Stable, bilateral simple and indeterminate renal cysts. Correlation with nonemergent renal ultrasound is recommended. This recommendation follows ACR consensus guidelines: Management of the Incidental Renal Mass on CT: A White Paper of the ACR Incidental Findings Committee. J Am Coll Radiol [phone]. 3. Stable common bile duct and pancreatic ductal dilatation. Correlation with MRCP is recommended. 4. Postoperative changes within the lumbar spine. 5. Aortic atherosclerosis. Aortic  Atherosclerosis (ICD10-I70.0). Electronically Signed   By: Virgina Norfolk M.D.   On: 02/04/2022 22:29   DG Chest 2 View  Result Date: 02/04/2022 CLINICAL DATA:  Chest pain. EXAM: CHEST - 2 VIEW COMPARISON:  Chest radiograph dated 09/26/2021. FINDINGS: Background of emphysema and chronic interstitial coarsening. No focal consolidation, pleural effusion, pneumothorax. Right upper lobe scarring stable cardiomediastinal silhouette. Atherosclerotic calcification of the aortic arch. Thoracic spine stimulator wires. No acute osseous pathology. IMPRESSION: 1. No acute cardiopulmonary process. 2. Emphysema. Electronically Signed   By: Anner Crete M.D.   On: 02/04/2022 22:27    Irwin Brakeman, MD  [facility]  If 7PM-7AM, please contact night-coverage [email or web address] Password [email or web address] 02/10/2022, 10:16 AM   LOS: 5 days

## 2022-02-11 DIAGNOSIS — K859 Acute pancreatitis without necrosis or infection, unspecified: Secondary | ICD-10-CM | POA: Diagnosis not present

## 2022-02-11 DIAGNOSIS — K861 Other chronic pancreatitis: Secondary | ICD-10-CM | POA: Diagnosis not present

## 2022-02-11 MED ORDER — METOCLOPRAMIDE HCL 5 MG/5ML PO SOLN
5.0000 mg | Freq: Three times a day (TID) | ORAL | Status: DC
  Administered 2022-02-11 (×2): 5 mg
  Filled 2022-02-11 (×3): qty 10

## 2022-02-11 MED ORDER — PROMETHAZINE HCL 25 MG/ML IJ SOLN
INTRAMUSCULAR | Status: AC
  Filled 2022-02-11: qty 1

## 2022-02-11 MED ORDER — MORPHINE SULFATE (PF) 2 MG/ML IV SOLN
2.0000 mg | INTRAVENOUS | Status: DC | PRN
  Administered 2022-02-11 – 2022-02-12 (×6): 2 mg via INTRAVENOUS
  Filled 2022-02-11 (×6): qty 1

## 2022-02-11 NOTE — Progress Notes (Signed)
patient continues to have intermittent nausea; waxing and waning   Back pain. Wants to try more to eat.  Vital signs in last 24 hours: Temp:  [98.2 F (36.8 C)-99.1 F (37.3 C)] 98.2 F (36.8 C) (12/09 1222) Pulse Rate:  [73-91] 77 (12/09 1222) Resp:  [16-18] 16 (12/09 1222) BP: (126-156)/(69-80) 154/76 (12/09 1222) SpO2:  [100 %] 100 % (12/09 1222) Last BM Date : 02/04/22 General:     Chronically ill-appearingpleasant and cooperative in NAD Abdomen:   mild epigastric tenderness to palpation. Extremities:  Without clubbing or edema.    Intake/Output from previous day: 12/08 0701 - 12/09 0700 In: 954.5 [P.O.:720; I.V.:234.5] Out: 1443 [Urine:3775] Intake/Output this shift: No intake/output data recorded.  Lab Results: Recent Labs    02/09/22 0549 02/10/22 0457  WBC 3.5* 3.9*  HGB 11.8* 10.5*  HCT 38.7 35.1*  PLT 232 214   BMET Recent Labs    02/09/22 0549 02/10/22 0457  NA 136 137  K 4.0 4.6  CL 101 103  CO2 29 29  GLUCOSE 117* 101*  BUN <5* 7*  CREATININE 0.44 0.53  CALCIUM 8.5* 8.4*   LFT No results for input(s): "PROT", "ALBUMIN", "AST", "ALT", "ALKPHOS", "BILITOT", "BILIDIR", "IBILI" in the last 72 hours. PT/INR No results for input(s): "LABPROT", "INR" in the last 72 hours. Hepatitis Panel No results for input(s): "HEPBSAG", "HCVAB", "HEPAIGM", "HEPBIGM" in the last 72 hours. C-Diff No results for input(s): "CDIFFTOX" in the last 72 hours.   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  It is really difficult to sort out how much back pain she is having versus pancreas pain.  Symptoms of nausea certainly seem to be related to pain.  We are limited in obtaining further diagnostic studies because she has not a candidate for an MRI or an endoscopic  ultrasound.     Recommendations:  Would like to try and break the cycle of nausea as follows:  Add scheduled Reglan 5 mg 30 minutes AC and nightly to her regimen.  Will go with the IV route initially  I discussed  the potential side effects including dystonia and tardive dyskinesia with the patient at length.  She understands in rare instances, side effects can be permanent even after cessation of medication. We will start with a low-dose and stop the promethazine for now.  Continue scheduled Zofran.  Opioid management per attending.  Continue PPI and pancreatic enzyme therapy  low-fat diet.  Outpatient pancreatic protocol CT in about 8 weeks.

## 2022-02-11 NOTE — Progress Notes (Signed)
PROGRESS NOTE  Jasmine [name] QBH:[id number] DOB: May 08, 1955 DOA: 02/04/2022 PCP: Janora Norlander, DO  Brief History:  66 year old female with history of COPD, chronic respiratory failure on 6-7 L, tobacco abuse, opioid dependence secondary to chronic back pain, hypertension, hyperlipidemia,nephrolithiasis, colon polyps (TA's), GERD, idiopathic pancreatitis, abnormal pancreas imaging with dilated duct, chronically dilated bile duct, presents with a 4-day history of abdominal pain more so in her lower abdomen with associated nausea and vomiting.  The patient denies any new medications.  She denies any alcohol or illicit substances.  She has not been taking any NSAIDs.  She states that she has not been able to tolerate any of her oral medications because of her emesis.  She has not had any fevers, chills, chest pain, hematemesis, hematochezia, melena.  She states that her breathing and coughing are the same as usual.  She denies any worsening lower extremity edema.  She denies any recent sick contacts or travels.  She has had some loose stools.  Notably, the patient has been following Dr. Rush Landmark for her chronic pancreatitis.  She last saw him on 01/03/2022.  At that visit it was felt best to defer elective EUS secondary to her chronic respiratory failure and risks involved with conscious sedation.  Plans were to obtain a CT of the pancreas with pancreatic protocol.  She was restarted on pancreatic enzyme supplementation.  In the ED, the patient had low-grade temperature of 99.3 F.  She was mildly tachycardic in 110 range and hemodynamically stable.  Oxygen saturation was 100% on 6 L.  WBC 4.8, hemoglobin 12.0, platelets 227,000.  Sodium 141, potassium 3.8, bicarbonate 28, serum creatinine 0.58.  AST 18, ALT 13, alk phosphatase 111, total bilirubin 1.0.  Troponin 8>>9.  EKG shows sinus rhythm with nonspecific ST changes.  The patient was started on IV fluids and IV opioids for her  intractable nausea and vomiting.  She was admitted for further workup and treatment.   Assessment/Plan: Intractable nausea and vomiting -due to viral gastroenteritis versus acute on chronic pancreatitis -Remain n.p.o>>advanced to clears on 12/5; slowly advance to full liquid 12/7 -IV fluids reduced  -pt reports she is not able to advance diet today. Continue full liquids.   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  No bowel wall thickening.  Stable CBD and pancreatic ductal dilatation. -Patient follows Dr. Rush Landmark -Restarted Creon 12/7    Chronic respiratory failure with hypoxia -She is chronically on 6 to 7 L -Stable presently   Opioid dependence -PDMP reviewed -Patient receives OxyContin 15 mg, #60 monthly -Patient receives Percocet 10/325 #150 monthly -No red flags   COPD -Stable presently without exacerbation -Continue bronchodilators--yupelri, brovana, pulmicort   Tobacco abuse -Tobacco cessation discussed   Essential hypertension -Restarted amlodipine and Zebeta -IV hydralazine prn SBP >180   Hyperlipidemia -Restart statin when able to tolerate p.o. better    Hypokalemia -added KCl to IVF -repleted   Hypomagnesemia - repleted     Family Communication:   no Family at bedside   Consultants:  GI   Code Status:  FULL   DVT Prophylaxis:  [place] Lovenox     Procedures: As Listed in Progress Note Above   Antibiotics: None  Subjective: Pt had a bad night and had a lot of nausea requiring extra nausea meds, still requiring IV pain meds, not willing to advance diet today.       Objective: Vitals:   02/10/22 2040  02/10/22 2055 02/10/22 2056 02/11/22 0521  BP: 126/69   (!) 156/80  Pulse: 73   91  Resp: 18   18  Temp: 98.7 F (37.1 C)   99.1 F (37.3 C)  TempSrc: Oral   Oral  SpO2: 100% 100% 100% 100%  Weight:      Height:        Intake/Output Summary (Last 24 hours) at 02/11/2022 1015 Last data filed at 02/10/2022 2350 Gross per 24 hour  Intake 474.5 ml  Output 2900 ml  Net -2425.5 ml   Weight change:  Exam:  General:  Pt is alert, follows commands appropriately, not in acute distress HEENT: No icterus, No thrush, No neck mass, [place]/AT Cardiovascular: normal S1/S2, no rubs, no gallops Respiratory: bibasilar rales. No wheeze.  Diminshed BS bilater Abdomen: soft, normal BS, persistent epigastric TTP, no guarding.  Extremities: No edema, No lymphangitis, No petechiae, No rashes, no synovitis  Data Reviewed: I have personally reviewed following labs and imaging studies Basic Metabolic Panel: Recent Labs  Lab 02/06/22 0507 02/07/22 0521 02/08/22 0541 02/09/22 0549 02/10/22 0457  NA 135 137 136 136 137  K 3.1* 2.9* 3.7 4.0 4.6  CL 97* 98 102 101 103  CO2 25 19* _0 GLUCOSE 67* 70 96 117* 101*  BUN 8 6* <5* <5* 7*  CREATININE 0.64 0.76 0.60 0.44 0.53  CALCIUM 8.7* 8.6* 8.2* 8.5* 8.4*  MG  --  1.6* 1.9 1.6* 2.6*   Liver Function Tests: Recent Labs  Lab 02/04/22 2008 02/06/22 0507  AST 18 19  ALT 13 11  ALKPHOS 111 98  BILITOT 1.0 1.0  PROT 7.3 6.3*  ALBUMIN 3.6 3.2*   Recent Labs  Lab 02/04/22 2008  LIPASE 25   No results for input(s): "AMMONIA" in the last 168 hours. Coagulation Profile: No results for input(s): "INR", "PROTIME" in the last 168 hours. CBC: Recent Labs  Lab 02/04/22 2008 02/06/22 0507 02/07/22 0521 02/09/22 0549 02/10/22 0457  WBC 4.8 4.9 3.8* 3.5* 3.9*  HGB 12.0 10.9* 11.5* 11.8* 10.5*  HCT 39.8 36.1 38.0 38.7 35.1*  MCV 108.4* 108.7* 108.3* 106.3* 109.0*  PLT 227 222 224 232 214   Cardiac Enzymes: No results for input(s):  "CKTOTAL", "CKMB", "CKMBINDEX", "TROPONINI" in the last 168 hours. BNP: Invalid input(s): "POCBNP" CBG: No results for input(s): "GLUCAP" in the last 168 hours. HbA1C: No results for input(s): "HGBA1C" in the last 72 hours. Urine analysis:    Component Value Date/Time   COLORURINE YELLOW 02/04/2022 2141   APPEARANCEUR CLEAR 02/04/2022 2141   APPEARANCEUR Clear 06/07/2021 1524   LABSPEC 1.014 02/04/2022 2141   PHURINE 6.0 02/04/2022 2141   GLUCOSEU NEGATIVE 02/04/2022 2141   HGBUR NEGATIVE 02/04/2022 2141   [place] NEGATIVE 02/04/2022 2141   BILIRUBINUR Negative 06/07/2021 1524   KETONESUR 20 (A) 02/04/2022 2141   PROTEINUR 100 (A) 02/04/2022 2141   NITRITE NEGATIVE 02/04/2022 2141   LEUKOCYTESUR NEGATIVE 02/04/2022 2141   Recent Results (from the past 240 hour(s))  SARS Coronavirus 2 by RT PCR (hospital order, performed in [facility] hospital lab) *cepheid single result test* Anterior Nasal Swab     Status: None   Collection Time: 02/05/22  2:52 PM   Specimen: Anterior Nasal Swab  Result Value Ref Range Status   SARS Coronavirus 2 by RT PCR NEGATIVE NEGATIVE Final    Comment: (NOTE) SARS-CoV-2 target nucleic acids are NOT DETECTED.  The SARS-CoV-2 RNA is generally detectable in upper and lower respiratory specimens during the acute phase of infection. The lowest concentration of SARS-CoV-2 viral copies this assay can detect is 250 copies / mL. A negative result does not preclude SARS-CoV-2 infection and should not be used as the sole basis for treatment or other patient management decisions.  A negative result may occur with improper specimen collection / handling, submission of specimen other than nasopharyngeal swab, presence of viral mutation(s) within the areas targeted by this assay, and inadequate number of viral copies (<250 copies / mL). A negative result must be combined with clinical observations, patient history, and epidemiological information.  Fact  Sheet for Patients:   https://www.patel.info/  Fact Sheet for Healthcare Providers: https://hall.com/  This test is not yet approved or  cleared by the [place] FDA and has been authorized for detection and/or diagnosis of SARS-CoV-2 by FDA under an Emergency Use Authorization (EUA).  This EUA will remain in effect (meaning this test can be used) for the duration of the COVID-19 declaration under Section 564(b)(1) of the Act, 21 U.S.C. section 360bbb-3(b)(1), unless the authorization is terminated or revoked sooner.  Performed at [facility], [address], [place], [place] [address]      Scheduled Meds:  amLODipine  10 mg Oral Daily   arformoterol  15 mcg Nebulization BID   bisoprolol  2.5 mg Oral Daily   budesonide (PULMICORT) nebulizer solution  0.5 mg Nebulization BID   enoxaparin (LOVENOX) injection  40 mg Subcutaneous Q24H   feeding supplement  237 mL Oral TID BM   influenza vaccine adjuvanted  0.5 mL Intramuscular Tomorrow-1000   lipase/protease/amylase  72,000 Units Oral TID WC   ondansetron (ZOFRAN) IV  4 mg Intravenous Q6H   pantoprazole (PROTONIX) IV  40 mg Intravenous Q12H   revefenacin  175 mcg Nebulization Daily   traZODone  300 mg Oral QHS   Continuous Infusions:  sodium chloride 70 mL/hr at 02/11/22 3762   promethazine (PHENERGAN) injection (IM or IVPB) 12.5 mg (02/11/22 0026)    Procedures/Studies: CT ABDOMEN PELVIS W CONTRAST  Result Date: 02/04/2022 CLINICAL DATA:  Abdominal pain with nausea and vomiting. EXAM: CT ABDOMEN AND PELVIS WITH CONTRAST TECHNIQUE: Multidetector CT imaging of the abdomen and pelvis was performed using the standard protocol following bolus administration of intravenous contrast. RADIATION DOSE REDUCTION: This exam was performed according to the departmental dose-optimization program which includes automated exposure control, adjustment of the mA and/or kV according to patient size  and/or use of iterative reconstruction technique. CONTRAST:  14m OMNIPAQUE IOHEXOL 300 MG/ML  SOLN COMPARISON:  November 30, 2021 FINDINGS: Lower chest: No acute abnormality. Hepatobiliary: No focal liver abnormality is seen. No gallstones or gallbladder wall thickening. The common bile duct is dilated and measures 1.5 cm in diameter. Mild to moderate severity central intrahepatic biliary dilatation is also seen. Pancreas: Diffuse atrophy of the pancreatic parenchyma is seen with subcentimeter parenchymal calcifications noted within the pancreatic head. Pancreatic ductal dilatation is noted (7.2 mm in diameter). Spleen: Normal in size without focal abnormality. Adrenals/Urinary Tract: Adrenal glands are unremarkable. Kidneys are normal in size, without renal calculi or hydronephrosis. Multiple stable, bilateral subcentimeter simple and indeterminate cysts are seen. The urinary bladder is unremarkable. Stomach/Bowel: Stomach is within normal limits. Appendix appears normal. No evidence of bowel wall  thickening, distention, or inflammatory changes. Vascular/Lymphatic: Aortic atherosclerosis. No enlarged abdominal or pelvic lymph nodes. Reproductive: Status post hysterectomy. No adnexal masses. Other: No abdominal wall hernia or abnormality. No abdominopelvic ascites. Musculoskeletal: Postoperative changes are seen within the lumbar spine at the levels of L3, L4 and L5. IMPRESSION: 1. Findings consistent with chronic pancreatitis. 2. Stable, bilateral simple and indeterminate renal cysts. Correlation with nonemergent renal ultrasound is recommended. This recommendation follows ACR consensus guidelines: Management of the Incidental Renal Mass on CT: A White Paper of the ACR Incidental Findings Committee. J Am Coll Radiol [id number]. 3. Stable common bile duct and pancreatic ductal dilatation. Correlation with MRCP is recommended. 4. Postoperative changes within the lumbar spine. 5. Aortic atherosclerosis. Aortic  Atherosclerosis (ICD10-I70.0). Electronically Signed   By: Virgina Norfolk M.D.   On: 02/04/2022 22:29   DG Chest 2 View  Result Date: 02/04/2022 CLINICAL DATA:  Chest pain. EXAM: CHEST - 2 VIEW COMPARISON:  Chest radiograph dated 09/26/2021. FINDINGS: Background of emphysema and chronic interstitial coarsening. No focal consolidation, pleural effusion, pneumothorax. Right upper lobe scarring stable cardiomediastinal silhouette. Atherosclerotic calcification of the aortic arch. Thoracic spine stimulator wires. No acute osseous pathology. IMPRESSION: 1. No acute cardiopulmonary process. 2. Emphysema. Electronically Signed   By: Anner Crete M.D.   On: 02/04/2022 22:27    Irwin Brakeman, MD  [facility]  If 7PM-7AM, please contact night-coverage [email or web address] Password [email or web address] 02/11/2022, 10:15 AM   LOS: 6 days

## 2022-02-12 DIAGNOSIS — K859 Acute pancreatitis without necrosis or infection, unspecified: Secondary | ICD-10-CM | POA: Diagnosis not present

## 2022-02-12 DIAGNOSIS — R1013 Epigastric pain: Secondary | ICD-10-CM | POA: Diagnosis not present

## 2022-02-12 DIAGNOSIS — K861 Other chronic pancreatitis: Secondary | ICD-10-CM | POA: Diagnosis not present

## 2022-02-12 DIAGNOSIS — F1129 Opioid dependence with unspecified opioid-induced disorder: Secondary | ICD-10-CM | POA: Diagnosis not present

## 2022-02-12 MED ORDER — MORPHINE SULFATE (PF) 4 MG/ML IV SOLN
3.0000 mg | INTRAVENOUS | Status: DC | PRN
  Administered 2022-02-12 – 2022-02-14 (×16): 3 mg via INTRAVENOUS
  Filled 2022-02-12 (×16): qty 1

## 2022-02-12 MED ORDER — METOCLOPRAMIDE HCL 5 MG/ML IJ SOLN
5.0000 mg | Freq: Four times a day (QID) | INTRAMUSCULAR | Status: AC
  Administered 2022-02-12 – 2022-02-14 (×8): 5 mg via INTRAVENOUS
  Filled 2022-02-12 (×8): qty 2

## 2022-02-12 NOTE — Progress Notes (Signed)
Changed humidification bottle on patient's O2 line during breathing treatment.

## 2022-02-12 NOTE — Progress Notes (Signed)
PROGRESS NOTE  Jasmine [name] QQI:[id number] DOB: Jul 18, 1955 DOA: 02/04/2022 PCP: Janora Norlander, DO  Brief History:  66 year old female with history of COPD, chronic respiratory failure on 6-7 L, tobacco abuse, opioid dependence secondary to chronic back pain, hypertension, hyperlipidemia,nephrolithiasis, colon polyps (TA's), GERD, idiopathic pancreatitis, abnormal pancreas imaging with dilated duct, chronically dilated bile duct, presents with a 4-day history of abdominal pain more so in her lower abdomen with associated nausea and vomiting.  The patient denies any new medications.  She denies any alcohol or illicit substances.  She has not been taking any NSAIDs.  She states that she has not been able to tolerate any of her oral medications because of her emesis.  She has not had any fevers, chills, chest pain, hematemesis, hematochezia, melena.  She states that her breathing and coughing are the same as usual.  She denies any worsening lower extremity edema.  She denies any recent sick contacts or travels.  She has had some loose stools.  Notably, the patient has been following Dr. Rush Landmark for her chronic pancreatitis.  She last saw him on 01/03/2022.  At that visit it was felt best to defer elective EUS secondary to her chronic respiratory failure and risks involved with conscious sedation.  Plans were to obtain a CT of the pancreas with pancreatic protocol.  She was restarted on pancreatic enzyme supplementation.  In the ED, the patient had low-grade temperature of 99.3 F.  She was mildly tachycardic in 110 range and hemodynamically stable.  Oxygen saturation was 100% on 6 L.  WBC 4.8, hemoglobin 12.0, platelets 227,000.  Sodium 141, potassium 3.8, bicarbonate 28, serum creatinine 0.58.  AST 18, ALT 13, alk phosphatase 111, total bilirubin 1.0.  Troponin 8>>9.  EKG shows sinus rhythm with nonspecific ST changes.  The patient was started on IV fluids and IV opioids for her  intractable nausea and vomiting.  She was admitted for further workup and treatment.   Assessment/Plan: Intractable nausea and vomiting -due to viral gastroenteritis versus acute on chronic pancreatitis -Remain n.p.o>>advanced to clears on 12/5; slowly advance to full liquid 12/7 -IV fluids reduced  -pt reports she is not able to advance diet today. Continue full liquids.   -pt prefers IV morphine over hydromorphone; reduced dose 2-4 mg IV Q3h prn  -Continue pantoprazole twice daily -Around-the-clock antiemetics.  IV metoclopramide x 24 hours per GI service.  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  No bowel wall thickening.  Stable CBD and pancreatic ductal dilatation. -Patient follows Dr. Rush Landmark -Restarted Creon 12/7    Chronic respiratory failure with hypoxia -She is chronically on 6 to 7 L -Stable presently   Opioid dependence -PDMP reviewed -Patient receives OxyContin 15 mg, #60 monthly -Patient receives Percocet 10/325 #150 monthly -No red flags   COPD -Stable presently without exacerbation -Continue bronchodilators--yupelri, brovana, pulmicort   Tobacco abuse -Tobacco cessation discussed   Essential hypertension -Restarted amlodipine and Zebeta -IV hydralazine prn SBP >180   Hyperlipidemia -Restart statin when able to tolerate p.o. better    Hypokalemia -added KCl to IVF -repleted   Hypomagnesemia - repleted     Family Communication:   no Family at bedside   Consultants:  GI   Code Status:  FULL   DVT Prophylaxis:  [place] Lovenox     Procedures:  As Listed in Progress Note Above   Antibiotics: None   Subjective: Pt reports that she is not having as much pain relief and requesting to increase IV morphine.       Objective: Vitals:   02/11/22  2052 02/12/22 0542 02/12/22 0748 02/12/22 1333  BP: 119/68 (!) 155/79  (!) 101/53  Pulse: 81 84  78  Resp: _0 Temp: 99.1 F (37.3 C) 98.7 F (37.1 C)  98.2 F (36.8 C)  TempSrc: Oral Oral  Oral  SpO2: 100% 100% 98% 100%  Weight:      Height:        Intake/Output Summary (Last 24 hours) at 02/12/2022 1618 Last data filed at 02/12/2022 1312 Gross per 24 hour  Intake 720 ml  Output 1550 ml  Net -830 ml   Weight change:  Exam:  General:  Pt is alert, follows commands appropriately, not in acute distress HEENT: No icterus, No thrush, No neck mass, [place]/AT Cardiovascular: normal S1/S2, no rubs, no gallops Respiratory: bibasilar rales. No wheeze.  Diminshed BS bilater Abdomen: soft, normal BS, persistent epigastric TTP, no guarding.  Extremities: No edema, No lymphangitis, No petechiae, No rashes, no synovitis  Data Reviewed: I have personally reviewed following labs and imaging studies Basic Metabolic Panel: Recent Labs  Lab 02/06/22 0507 02/07/22 0521 02/08/22 0541 02/09/22 0549 02/10/22 0457  NA 135 137 136 136 137  K 3.1* 2.9* 3.7 4.0 4.6  CL 97* 98 102 101 103  CO2 25 19* _1 GLUCOSE 67* 70 96 117* 101*  BUN 8 6* <5* <5* 7*  CREATININE 0.64 0.76 0.60 0.44 0.53  CALCIUM 8.7* 8.6* 8.2* 8.5* 8.4*  MG  --  1.6* 1.9 1.6* 2.6*   Liver Function Tests: Recent Labs  Lab 02/06/22 0507  AST 19  ALT 11  ALKPHOS 98  BILITOT 1.0  PROT 6.3*  ALBUMIN 3.2*   No results for input(s): "LIPASE", "AMYLASE" in the last 168 hours.  No results for input(s): "AMMONIA" in the last 168 hours. Coagulation Profile: No results for input(s): "INR", "PROTIME" in the last 168 hours. CBC: Recent Labs  Lab 02/06/22 0507 02/07/22 0521 02/09/22 0549 02/10/22 0457  WBC 4.9 3.8* 3.5* 3.9*  HGB 10.9* 11.5* 11.8* 10.5*  HCT 36.1 38.0 38.7 35.1*  MCV 108.7* 108.3* 106.3* 109.0*  PLT 222 224 232 214   Cardiac Enzymes: No results for input(s): "CKTOTAL", "CKMB",  "CKMBINDEX", "TROPONINI" in the last 168 hours. BNP: Invalid input(s): "POCBNP" CBG: No results for input(s): "GLUCAP" in the last 168 hours. HbA1C: No results for input(s): "HGBA1C" in the last 72 hours. Urine analysis:    Component Value Date/Time   COLORURINE YELLOW 02/04/2022 2141   APPEARANCEUR CLEAR 02/04/2022 2141   APPEARANCEUR Clear 06/07/2021 1524   LABSPEC 1.014 02/04/2022 2141   PHURINE 6.0 02/04/2022 2141   GLUCOSEU NEGATIVE 02/04/2022 2141   HGBUR NEGATIVE 02/04/2022 2141   [place] NEGATIVE 02/04/2022 2141   BILIRUBINUR Negative 06/07/2021 1524   KETONESUR 20 (A) 02/04/2022 2141   PROTEINUR 100 (A) 02/04/2022 2141   NITRITE NEGATIVE 02/04/2022 2141   LEUKOCYTESUR NEGATIVE 02/04/2022 2141   Recent Results (from the past 240 hour(s))  SARS Coronavirus 2 by RT PCR (hospital order, performed in [facility] hospital lab) *cepheid single result test* Anterior Nasal Swab     Status: None   Collection Time: 02/05/22  2:52 PM   Specimen: Anterior Nasal Swab  Result Value Ref Range Status  SARS Coronavirus 2 by RT PCR NEGATIVE NEGATIVE Final    Comment: (NOTE) SARS-CoV-2 target nucleic acids are NOT DETECTED.  The SARS-CoV-2 RNA is generally detectable in upper and lower respiratory specimens during the acute phase of infection. The lowest concentration of SARS-CoV-2 viral copies this assay can detect is 250 copies / mL. A negative result does not preclude SARS-CoV-2 infection and should not be used as the sole basis for treatment or other patient management decisions.  A negative result may occur with improper specimen collection / handling, submission of specimen other than nasopharyngeal swab, presence of viral mutation(s) within the areas targeted by this assay, and inadequate number of viral copies (<250 copies / mL). A negative result must be combined with clinical observations, patient history, and epidemiological information.  Fact Sheet for Patients:    https://www.patel.info/  Fact Sheet for Healthcare Providers: https://hall.com/  This test is not yet approved or  cleared by the [place] FDA and has been authorized for detection and/or diagnosis of SARS-CoV-2 by FDA under an Emergency Use Authorization (EUA).  This EUA will remain in effect (meaning this test can be used) for the duration of the COVID-19 declaration under Section 564(b)(1) of the Act, 21 U.S.C. section 360bbb-3(b)(1), unless the authorization is terminated or revoked sooner.  Performed at [facility], [address] [place],  [address]      Scheduled Meds:  amLODipine  10 mg Oral Daily   arformoterol  15 mcg Nebulization BID   bisoprolol  2.5 mg Oral Daily   budesonide (PULMICORT) nebulizer solution  0.5 mg Nebulization BID   enoxaparin (LOVENOX) injection  40 mg Subcutaneous Q24H   feeding supplement  237 mL Oral TID BM   influenza vaccine adjuvanted  0.5 mL Intramuscular Tomorrow-1000   lipase/protease/amylase  72,000 Units Oral TID WC   metoCLOPramide (REGLAN) injection  5 mg Intravenous Q6H   ondansetron (ZOFRAN) IV  4 mg Intravenous Q6H   pantoprazole (PROTONIX) IV  40 mg Intravenous Q12H   revefenacin  175 mcg Nebulization Daily   traZODone  300 mg Oral QHS   Continuous Infusions:  sodium chloride 70 mL/hr at 02/12/22 1211    Procedures/Studies: CT ABDOMEN PELVIS W CONTRAST  Result Date: 02/04/2022 CLINICAL DATA:  Abdominal pain with nausea and vomiting. EXAM: CT ABDOMEN AND PELVIS WITH CONTRAST TECHNIQUE: Multidetector CT imaging of the abdomen and pelvis was performed using the standard protocol following bolus administration of intravenous contrast. RADIATION DOSE REDUCTION: This exam was performed according to the departmental dose-optimization program which includes automated exposure control, adjustment of the mA and/or kV according to patient size and/or use of iterative reconstruction  technique. CONTRAST:  134m OMNIPAQUE IOHEXOL 300 MG/ML  SOLN COMPARISON:  November 30, 2021 FINDINGS: Lower chest: No acute abnormality. Hepatobiliary: No focal liver abnormality is seen. No gallstones or gallbladder wall thickening. The common bile duct is dilated and measures 1.5 cm in diameter. Mild to moderate severity central intrahepatic biliary dilatation is also seen. Pancreas: Diffuse atrophy of the pancreatic parenchyma is seen with subcentimeter parenchymal calcifications noted within the pancreatic head. Pancreatic ductal dilatation is noted (7.2 mm in diameter). Spleen: Normal in size without focal abnormality. Adrenals/Urinary Tract: Adrenal glands are unremarkable. Kidneys are normal in size, without renal calculi or hydronephrosis. Multiple stable, bilateral subcentimeter simple and indeterminate cysts are seen. The urinary bladder is unremarkable. Stomach/Bowel: Stomach is within normal limits. Appendix appears normal. No evidence of bowel wall thickening, distention, or inflammatory changes. Vascular/Lymphatic: Aortic atherosclerosis. No  enlarged abdominal or pelvic lymph nodes. Reproductive: Status post hysterectomy. No adnexal masses. Other: No abdominal wall hernia or abnormality. No abdominopelvic ascites. Musculoskeletal: Postoperative changes are seen within the lumbar spine at the levels of L3, L4 and L5. IMPRESSION: 1. Findings consistent with chronic pancreatitis. 2. Stable, bilateral simple and indeterminate renal cysts. Correlation with nonemergent renal ultrasound is recommended. This recommendation follows ACR consensus guidelines: Management of the Incidental Renal Mass on CT: A White Paper of the ACR Incidental Findings Committee. J Am Coll Radiol [phone]. 3. Stable common bile duct and pancreatic ductal dilatation. Correlation with MRCP is recommended. 4. Postoperative changes within the lumbar spine. 5. Aortic atherosclerosis. Aortic Atherosclerosis (ICD10-I70.0).  Electronically Signed   By: Virgina Norfolk M.D.   On: 02/04/2022 22:29   DG Chest 2 View  Result Date: 02/04/2022 CLINICAL DATA:  Chest pain. EXAM: CHEST - 2 VIEW COMPARISON:  Chest radiograph dated 09/26/2021. FINDINGS: Background of emphysema and chronic interstitial coarsening. No focal consolidation, pleural effusion, pneumothorax. Right upper lobe scarring stable cardiomediastinal silhouette. Atherosclerotic calcification of the aortic arch. Thoracic spine stimulator wires. No acute osseous pathology. IMPRESSION: 1. No acute cardiopulmonary process. 2. Emphysema. Electronically Signed   By: Anner Crete M.D.   On: 02/04/2022 22:27    Irwin Brakeman, MD  [facility]  If 7PM-7AM, please contact night-coverage [email or web address] Password [email or web address] 02/12/2022, 4:18 PM   LOS: 7 days

## 2022-02-12 NOTE — Progress Notes (Signed)
Overall feeling better as far as low back pain and nausea are concerned.  She ended up receiving Reglan elixir yesterday.  Did not like the taste.  However, she feels it helped with her nausea.  Vital signs in last 24 hours: Temp:  [98.7 F (37.1 C)-99.1 F (37.3 C)] 98.7 F (37.1 C) (12/10 0542) Pulse Rate:  [81-84] 84 (12/10 0542) Resp:  [16] 16 (12/10 0542) BP: (119-155)/(68-79) 155/79 (12/10 0542) SpO2:  [98 %-100 %] 98 % (12/10 0748) Last BM Date : 02/11/22 General:   Chronically ill-appearing lady,  pleasant and cooperative in NAD Abdomen: Nondistended positive bowel sounds.  Soft, only minimal diffuse abdominal tenderness to palpation.. Extremities:  Without clubbing or edema.    Intake/Output from previous day: 12/09 0701 - 12/10 0700 In: 720 [P.O.:720] Out: 2150 [Urine:2150] Intake/Output this shift: Total I/O In: 240 [P.O.:240] Out: -   Lab Results: Recent Labs    02/10/22 0457  WBC 3.9*  HGB 10.5*  HCT 35.1*  PLT 214   BMET Recent Labs    02/10/22 0457  NA 137  K 4.6  CL 103  CO2 29  GLUCOSE 101*  BUN 7*  CREATININE 0.53  CALCIUM 8.4*   LFT No results for input(s): "PROT", "ALBUMIN", "AST", "ALT", "ALKPHOS", "BILITOT", "BILIDIR", "IBILI" in the last 72 hours. PT/INR No results for input(s): "LABPROT", "INR" in the last 72 hours. Hepatitis Panel No results for input(s): "HEPBSAG", "HCVAB", "HEPAIGM", "HEPBIGM" in the last 72 hours. C-Diff No results for input(s): "CDIFFTOX" in the last 72 hours.   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  Acute on chronic abdominal pain seems improved.  Certainly her nausea is better with Reglan.  Opioids required for control of her chronic back pain.  Further evaluation of her pancreas is limited by unavailability  of MRCP/EUS  Recommendations:   PPI twice daily Continue Zofran and Reglan low-dose (IV over the next 24 hours and then we will reassess ) low-fat diet.  Continue pancreatic enzymes Outpatient CT with pancreatic protocol in 1-2 months.

## 2022-02-13 ENCOUNTER — Telehealth: Payer: Self-pay | Admitting: Gastroenterology

## 2022-02-13 DIAGNOSIS — F1129 Opioid dependence with unspecified opioid-induced disorder: Secondary | ICD-10-CM | POA: Diagnosis not present

## 2022-02-13 DIAGNOSIS — K859 Acute pancreatitis without necrosis or infection, unspecified: Secondary | ICD-10-CM | POA: Diagnosis not present

## 2022-02-13 DIAGNOSIS — R1013 Epigastric pain: Secondary | ICD-10-CM | POA: Diagnosis not present

## 2022-02-13 DIAGNOSIS — K861 Other chronic pancreatitis: Secondary | ICD-10-CM | POA: Diagnosis not present

## 2022-02-13 MED ORDER — OXYBUTYNIN CHLORIDE ER 5 MG PO TB24
5.0000 mg | ORAL_TABLET | Freq: Every day | ORAL | Status: DC
  Administered 2022-02-13 – 2022-02-14 (×2): 5 mg via ORAL
  Filled 2022-02-13 (×2): qty 1

## 2022-02-13 MED ORDER — PANTOPRAZOLE SODIUM 40 MG PO TBEC
40.0000 mg | DELAYED_RELEASE_TABLET | Freq: Two times a day (BID) | ORAL | Status: DC
  Administered 2022-02-14: 40 mg via ORAL
  Filled 2022-02-13: qty 1

## 2022-02-13 NOTE — Progress Notes (Signed)
PROGRESS NOTE  Jasmine [name] GQB:[id number] DOB: April 23, 1955 DOA: 02/04/2022 PCP: Janora Norlander, DO  Brief History:  66 year old female with history of COPD, chronic respiratory failure on 6-7 L, tobacco abuse, opioid dependence secondary to chronic back pain, hypertension, hyperlipidemia,nephrolithiasis, colon polyps (TA's), GERD, idiopathic pancreatitis, abnormal pancreas imaging with dilated duct, chronically dilated bile duct, presents with a 4-day history of abdominal pain more so in her lower abdomen with associated nausea and vomiting.  The patient denies any new medications.  She denies any alcohol or illicit substances.  She has not been taking any NSAIDs.  She states that she has not been able to tolerate any of her oral medications because of her emesis.  She has not had any fevers, chills, chest pain, hematemesis, hematochezia, melena.  She states that her breathing and coughing are the same as usual.  She denies any worsening lower extremity edema.  She denies any recent sick contacts or travels.  She has had some loose stools.  Notably, the patient has been following Dr. Rush Landmark for her chronic pancreatitis.  She last saw him on 01/03/2022.  At that visit it was felt best to defer elective EUS secondary to her chronic respiratory failure and risks involved with conscious sedation.  Plans were to obtain a CT of the pancreas with pancreatic protocol.  She was restarted on pancreatic enzyme supplementation.  In the ED, the patient had low-grade temperature of 99.3 F.  She was mildly tachycardic in 110 range and hemodynamically stable.  Oxygen saturation was 100% on 6 L.  WBC 4.8, hemoglobin 12.0, platelets 227,000.  Sodium 141, potassium 3.8, bicarbonate 28, serum creatinine 0.58.  AST 18, ALT 13, alk phosphatase 111, total bilirubin 1.0.  Troponin 8>>9.  EKG shows sinus rhythm with nonspecific ST changes.  The patient was started on IV fluids and IV opioids for her  intractable nausea and vomiting.  She was admitted for further workup and treatment.   Assessment/Plan: Intractable nausea and vomiting -due to viral gastroenteritis versus acute on chronic pancreatitis -Remain n.p.o>>advanced to clears on 12/5; slowly advance to full liquid 12/7 -IV fluids reduced  -pt reports she is not able to advance diet today. Continue full liquids.   -pt prefers IV morphine over hydromorphone; reduced dose 2-4 mg IV Q3h prn  -Continue pantoprazole twice daily -Around-the-clock antiemetics.  IV metoclopramide x 24 hours per GI service.  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  No bowel wall thickening.  Stable CBD and pancreatic ductal dilatation. -Patient follows Dr. Rush Landmark -Restarted Creon 12/7    Chronic respiratory failure with hypoxia -She is chronically on 6 to 7 L -Stable presently   Opioid dependence -PDMP reviewed -Patient receives OxyContin 15 mg, #60 monthly -Patient receives Percocet 10/325 #150 monthly -No red flags   COPD -Stable presently without exacerbation -Continue bronchodilators--yupelri, brovana, pulmicort   Tobacco abuse -Tobacco cessation discussed   Essential hypertension -Restarted amlodipine and Zebeta -IV hydralazine prn SBP >180   Hyperlipidemia -Restart statin when able to tolerate p.o. better    Hypokalemia -added KCl to IVF -repleted   Hypomagnesemia - repleted     Family Communication:   no Family at bedside   Consultants:  GI   Code Status:  FULL   DVT Prophylaxis:  [place] Lovenox     Procedures: As Listed in Progress Note Above   Antibiotics: None   Subjective: Pt says not ready for home yet, possibly tomorrow, pain persists, still requiring IV,  nausea is better, intake is better        Objective: Vitals:   02/13/22 0803 02/13/22 0805 02/13/22 0813 02/13/22 0929  BP:    118/66  Pulse:      Resp:      Temp:      TempSrc:      SpO2: 98% 100% 100%   Weight:      Height:        Intake/Output Summary (Last 24 hours) at 02/13/2022 1255 Last data filed at 02/13/2022 0900 Gross per 24 hour  Intake 720 ml  Output 1150 ml  Net -430 ml   Weight change:  Exam:  General:  Pt is alert, follows commands appropriately, not in acute distress HEENT: No icterus, No thrush, No neck mass, [place]/AT Cardiovascular: normal S1/S2, no rubs, no gallops Respiratory: bibasilar rales. No wheeze.  Diminshed BS bilater Abdomen: soft, normal BS, persistent epigastric TTP, no guarding.  Extremities: No edema, No lymphangitis, No petechiae, No rashes, no synovitis  Data Reviewed: I have personally reviewed following labs and imaging studies Basic Metabolic Panel: Recent Labs  Lab 02/07/22 0521 02/08/22 0541 02/09/22 0549 02/10/22 0457  NA 137 136 136 137  K 2.9* 3.7 4.0 4.6  CL 98 102 101 103  CO2 19* _0 GLUCOSE 70 96 117* 101*  BUN 6* <5* <5* 7*  CREATININE 0.76 0.60 0.44 0.53  CALCIUM 8.6* 8.2* 8.5* 8.4*  MG 1.6* 1.9 1.6* 2.6*   Liver Function Tests: No results for input(s): "AST", "ALT", "ALKPHOS", "BILITOT", "PROT", "ALBUMIN" in the last 168 hours.  No results for input(s): "LIPASE", "AMYLASE" in the last 168 hours.  No results for input(s): "AMMONIA" in the last 168 hours. Coagulation Profile: No results for input(s): "INR", "PROTIME" in the last 168 hours. CBC: Recent Labs  Lab 02/07/22 0521 02/09/22 0549 02/10/22 0457  WBC 3.8* 3.5* 3.9*  HGB 11.5* 11.8* 10.5*  HCT 38.0 38.7 35.1*  MCV 108.3* 106.3* 109.0*  PLT 224 232 214   Cardiac Enzymes: No results for input(s): "CKTOTAL", "CKMB", "CKMBINDEX", "TROPONINI" in the last 168 hours. BNP: Invalid input(s): "POCBNP" CBG: No results for input(s): "GLUCAP" in  the last 168 hours. HbA1C: No results for input(s): "HGBA1C" in the last 72 hours. Urine analysis:    Component Value Date/Time   COLORURINE YELLOW 02/04/2022 2141   APPEARANCEUR CLEAR 02/04/2022 2141   APPEARANCEUR Clear 06/07/2021 1524   LABSPEC 1.014 02/04/2022 2141   PHURINE 6.0 02/04/2022 2141   GLUCOSEU NEGATIVE 02/04/2022 2141   HGBUR NEGATIVE 02/04/2022 2141   [place] NEGATIVE 02/04/2022 2141   BILIRUBINUR Negative 06/07/2021 1524   KETONESUR 20 (A) 02/04/2022 2141   PROTEINUR 100 (A) 02/04/2022 2141   NITRITE NEGATIVE 02/04/2022 2141   LEUKOCYTESUR NEGATIVE 02/04/2022 2141   Recent Results (from the past 240 hour(s))  SARS Coronavirus 2 by RT PCR (hospital order, performed in [facility] hospital lab) *cepheid single result test* Anterior Nasal Swab     Status: None   Collection Time: 02/05/22  2:52 PM   Specimen: Anterior Nasal Swab  Result Value Ref Range Status   SARS Coronavirus 2 by RT PCR NEGATIVE NEGATIVE Final    Comment: (NOTE) SARS-CoV-2 target nucleic acids are NOT DETECTED.  The SARS-CoV-2 RNA is generally detectable in upper  and lower respiratory specimens during the acute phase of infection. The lowest concentration of SARS-CoV-2 viral copies this assay can detect is 250 copies / mL. A negative result does not preclude SARS-CoV-2 infection and should not be used as the sole basis for treatment or other patient management decisions.  A negative result may occur with improper specimen collection / handling, submission of specimen other than nasopharyngeal swab, presence of viral mutation(s) within the areas targeted by this assay, and inadequate number of viral copies (<250 copies / mL). A negative result must be combined with clinical observations, patient history, and epidemiological information.  Fact Sheet for Patients:   https://www.patel.info/  Fact Sheet for Healthcare  Providers: https://hall.com/  This test is not yet approved or  cleared by the [place] FDA and has been authorized for detection and/or diagnosis of SARS-CoV-2 by FDA under an Emergency Use Authorization (EUA).  This EUA will remain in effect (meaning this test can be used) for the duration of the COVID-19 declaration under Section 564(b)(1) of the Act, 21 U.S.C. section 360bbb-3(b)(1), unless the authorization is terminated or revoked sooner.  Performed at [facility], [address] [place], [place] [address]      Scheduled Meds:  amLODipine  10 mg Oral Daily   arformoterol  15 mcg Nebulization BID   bisoprolol  2.5 mg Oral Daily   budesonide (PULMICORT) nebulizer solution  0.5 mg Nebulization BID   enoxaparin (LOVENOX) injection  40 mg Subcutaneous Q24H   feeding supplement  237 mL Oral TID BM   influenza vaccine adjuvanted  0.5 mL Intramuscular Tomorrow-1000   lipase/protease/amylase  72,000 Units Oral TID WC   metoCLOPramide (REGLAN) injection  5 mg Intravenous Q6H   ondansetron (ZOFRAN) IV  4 mg Intravenous Q6H   pantoprazole  40 mg Oral BID AC   revefenacin  175 mcg Nebulization Daily   traZODone  300 mg Oral QHS   Continuous Infusions:  sodium chloride 70 mL/hr at 02/12/22 1211    Procedures/Studies: CT ABDOMEN PELVIS W CONTRAST  Result Date: 02/04/2022 CLINICAL DATA:  Abdominal pain with nausea and vomiting. EXAM: CT ABDOMEN AND PELVIS WITH CONTRAST TECHNIQUE: Multidetector CT imaging of the abdomen and pelvis was performed using the standard protocol following bolus administration of intravenous contrast. RADIATION DOSE REDUCTION: This exam was performed according to the departmental dose-optimization program which includes automated exposure control, adjustment of the mA and/or kV according to patient size and/or use of iterative reconstruction technique. CONTRAST:  123m OMNIPAQUE IOHEXOL 300 MG/ML  SOLN COMPARISON:  November 30, 2021  FINDINGS: Lower chest: No acute abnormality. Hepatobiliary: No focal liver abnormality is seen. No gallstones or gallbladder wall thickening. The common bile duct is dilated and measures 1.5 cm in diameter. Mild to moderate severity central intrahepatic biliary dilatation is also seen. Pancreas: Diffuse atrophy of the pancreatic parenchyma is seen with subcentimeter parenchymal calcifications noted within the pancreatic head. Pancreatic ductal dilatation is noted (7.2 mm in diameter). Spleen: Normal in size without focal abnormality. Adrenals/Urinary Tract: Adrenal glands are unremarkable. Kidneys are normal in size, without renal calculi or hydronephrosis. Multiple stable, bilateral subcentimeter simple and indeterminate cysts are seen. The urinary bladder is unremarkable. Stomach/Bowel: Stomach is within normal limits. Appendix appears normal. No evidence of bowel wall thickening, distention, or inflammatory changes. Vascular/Lymphatic: Aortic atherosclerosis. No enlarged abdominal or pelvic lymph nodes. Reproductive: Status post hysterectomy. No adnexal masses. Other: No abdominal wall hernia or abnormality. No abdominopelvic ascites. Musculoskeletal: Postoperative changes are seen within the lumbar  spine at the levels of L3, L4 and L5. IMPRESSION: 1. Findings consistent with chronic pancreatitis. 2. Stable, bilateral simple and indeterminate renal cysts. Correlation with nonemergent renal ultrasound is recommended. This recommendation follows ACR consensus guidelines: Management of the Incidental Renal Mass on CT: A White Paper of the ACR Incidental Findings Committee. J Am Coll Radiol [phone]. 3. Stable common bile duct and pancreatic ductal dilatation. Correlation with MRCP is recommended. 4. Postoperative changes within the lumbar spine. 5. Aortic atherosclerosis. Aortic Atherosclerosis (ICD10-I70.0). Electronically Signed   By: Virgina Norfolk M.D.   On: 02/04/2022 22:29   DG Chest 2  View  Result Date: 02/04/2022 CLINICAL DATA:  Chest pain. EXAM: CHEST - 2 VIEW COMPARISON:  Chest radiograph dated 09/26/2021. FINDINGS: Background of emphysema and chronic interstitial coarsening. No focal consolidation, pleural effusion, pneumothorax. Right upper lobe scarring stable cardiomediastinal silhouette. Atherosclerotic calcification of the aortic arch. Thoracic spine stimulator wires. No acute osseous pathology. IMPRESSION: 1. No acute cardiopulmonary process. 2. Emphysema. Electronically Signed   By: Anner Crete M.D.   On: 02/04/2022 22:27    Irwin Brakeman, MD  [facility]  If 7PM-7AM, please contact night-coverage [email or web address] Password [email or web address] 02/13/2022, 12:55 PM   LOS: 8 days

## 2022-02-13 NOTE — Telephone Encounter (Signed)
No, we need to cancel that and push it out a bit farther. Thanks!

## 2022-02-13 NOTE — Telephone Encounter (Signed)
Mandy: needs hospital follow-up in 4 weeks. Any APP.  Will need CT with pancreatic protocol arranged at that visit.

## 2022-02-13 NOTE — Progress Notes (Signed)
Gastroenterology Progress Note   Primary Care Physician:  Janora Norlander, DO Primary Gastroenterologist:  Dr. Gala Romney   Patient ID: Jasmine [name]; [id number]; August 30, 1955    Subjective   Breakfast not appetizing. Ate lunch and dinner well. Abdominal pain improved. Mild nausea, no vomiting.    Objective   Vital signs in last 24 hours Temp:  [98.2 F (36.8 C)-98.4 F (36.9 C)] 98.4 F (36.9 C) (12/11 0508) Pulse Rate:  [78-86] 86 (12/11 0508) Resp:  [17] 17 (12/11 0508) BP: (100-157)/(53-82) 157/82 (12/11 0508) SpO2:  [98 %-100 %] 100 % (12/11 0813) Last BM Date : 02/11/22  Physical Exam General:   Alert and oriented, pleasant Head:  Normocephalic and atraumatic. Abdomen:  Bowel sounds present, soft, TTP epigastric and non-distended. No HSM or hernias noted. No rebound or guarding. No masses appreciated  Extremities:  Without edema. Neurologic:  Alert and  oriented x4 Psych:  Alert and cooperative. Normal mood and affect.  Intake/Output from previous day: 12/10 0701 - 12/11 0700 In: 720 [P.O.:720] Out: 1150 [Urine:1150] Intake/Output this shift: No intake/output data recorded.   Studies/Results CT ABDOMEN PELVIS W CONTRAST  Result Date: 02/04/2022 CLINICAL DATA:  Abdominal pain with nausea and vomiting. EXAM: CT ABDOMEN AND PELVIS WITH CONTRAST TECHNIQUE: Multidetector CT imaging of the abdomen and pelvis was performed using the standard protocol following bolus administration of intravenous contrast. RADIATION DOSE REDUCTION: This exam was performed according to the departmental dose-optimization program which includes automated exposure control, adjustment of the mA and/or kV according to patient size and/or use of iterative reconstruction technique. CONTRAST:  127mL OMNIPAQUE IOHEXOL 300 MG/ML  SOLN COMPARISON:  November 30, 2021 FINDINGS: Lower chest: No acute abnormality. Hepatobiliary: No focal liver abnormality is seen. No gallstones or gallbladder wall  thickening. The common bile duct is dilated and measures 1.5 cm in diameter. Mild to moderate severity central intrahepatic biliary dilatation is also seen. Pancreas: Diffuse atrophy of the pancreatic parenchyma is seen with subcentimeter parenchymal calcifications noted within the pancreatic head. Pancreatic ductal dilatation is noted (7.2 mm in diameter). Spleen: Normal in size without focal abnormality. Adrenals/Urinary Tract: Adrenal glands are unremarkable. Kidneys are normal in size, without renal calculi or hydronephrosis. Multiple stable, bilateral subcentimeter simple and indeterminate cysts are seen. The urinary bladder is unremarkable. Stomach/Bowel: Stomach is within normal limits. Appendix appears normal. No evidence of bowel wall thickening, distention, or inflammatory changes. Vascular/Lymphatic: Aortic atherosclerosis. No enlarged abdominal or pelvic lymph nodes. Reproductive: Status post hysterectomy. No adnexal masses. Other: No abdominal wall hernia or abnormality. No abdominopelvic ascites. Musculoskeletal: Postoperative changes are seen within the lumbar spine at the levels of L3, L4 and L5. IMPRESSION: 1. Findings consistent with chronic pancreatitis. 2. Stable, bilateral simple and indeterminate renal cysts. Correlation with nonemergent renal ultrasound is recommended. This recommendation follows ACR consensus guidelines: Management of the Incidental Renal Mass on CT: A White Paper of the ACR Incidental Findings Committee. J Am Coll Radiol [phone]. 3. Stable common bile duct and pancreatic ductal dilatation. Correlation with MRCP is recommended. 4. Postoperative changes within the lumbar spine. 5. Aortic atherosclerosis. Aortic Atherosclerosis (ICD10-I70.0). Electronically Signed   By: Virgina Norfolk M.D.   On: 02/04/2022 22:29   DG Chest 2 View  Result Date: 02/04/2022 CLINICAL DATA:  Chest pain. EXAM: CHEST - 2 VIEW COMPARISON:  Chest radiograph dated 09/26/2021. FINDINGS:  Background of emphysema and chronic interstitial coarsening. No focal consolidation, pleural effusion, pneumothorax. Right upper lobe scarring stable cardiomediastinal silhouette. Atherosclerotic calcification of the  aortic arch. Thoracic spine stimulator wires. No acute osseous pathology. IMPRESSION: 1. No acute cardiopulmonary process. 2. Emphysema. Electronically Signed   By: Anner Crete M.D.   On: 02/04/2022 22:27    Assessment  66 y.o. 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 Presenting to the ED with abdominal pain, N/V.   Acute on chronic abdominal pain with N/V: known chronic pancreatitis. CT this admission with findings of chronic pancreatitis, stable CBD and pancreatic ductal dilatation. Lipase normal. LFTs normal. Not able to complete MRCP due to neurostimulator. Clinically improved and tolerating diet.   GI will sign off. Hopeful discharge today or next 24 hours.    Plan / Recommendations  PPI BID Continue Creon at current dosing GI signing off Will arrange outpatient follow-up CT with pancreatic protocol in 1-2 months    LOS: 8 days    02/13/2022, 8:30 AM  Annitta Needs, PhD, ANP-BC [facility]

## 2022-02-14 MED ORDER — TRAZODONE HCL 150 MG PO TABS: 300.0000 mg | ORAL_TABLET | Freq: Every day | ORAL | Status: DC

## 2022-02-14 MED ORDER — PANCRELIPASE (LIP-PROT-AMYL) 24000-76000 UNITS PO CPEP: 2.0000 | ORAL_CAPSULE | ORAL | Status: DC

## 2022-02-14 MED ORDER — METOCLOPRAMIDE HCL 5 MG PO TABS: 5.0000 mg | ORAL_TABLET | Freq: Four times a day (QID) | ORAL | 0 refills | Status: AC | PRN

## 2022-02-14 NOTE — Progress Notes (Signed)
Discharge instructions on medications and follow up visits,patient verbalized understanding. Prescriptions sent to Pharmacy of choice documented on AVS. IV discontinued catheter intact.Staff to accompany patient to awaiting vehicle.

## 2022-02-14 NOTE — Progress Notes (Signed)
[facility] and peri care offered. Pt declined at this time

## 2022-02-14 NOTE — Care Management Important Message (Signed)
Important Message  Patient Details  Name: Jasmine [name] MRN: [id number] Date of Birth: [birth year]   Medicare Important Message Given:  Yes     [name] 02/14/2022, 11:21 AM

## 2022-02-14 NOTE — Discharge Instructions (Signed)
IMPORTANT INFORMATION: PAY CLOSE ATTENTION  ° °PHYSICIAN DISCHARGE INSTRUCTIONS ° °Follow with Primary care provider  Gottschalk, Ashly M, DO  and other consultants as instructed by your Hospitalist Physician ° °SEEK MEDICAL CARE OR RETURN TO EMERGENCY ROOM IF SYMPTOMS COME BACK, WORSEN OR NEW PROBLEM DEVELOPS  ° °Please note: °You were cared for by a hospitalist during your hospital stay. Every effort will be made to forward records to your primary care provider.  You can request that your primary care provider send for your hospital records if they have not received them.  Once you are discharged, your primary care physician will handle any further medical issues. Please note that NO REFILLS for any discharge medications will be authorized once you are discharged, as it is imperative that you return to your primary care physician (or establish a relationship with a primary care physician if you do not have one) for your post hospital discharge needs so that they can reassess your need for medications and monitor your lab values. ° °Please get a complete blood count and chemistry panel checked by your Primary MD at your next visit, and again as instructed by your Primary MD. ° °Get Medicines reviewed and adjusted: °Please take all your medications with you for your next visit with your Primary MD ° °Laboratory/radiological data: °Please request your Primary MD to go over all hospital tests and procedure/radiological results at the follow up, please ask your primary care provider to get all Hospital records sent to his/her office. ° °In some cases, they will be blood work, cultures and biopsy results pending at the time of your discharge. Please request that your primary care provider follow up on these results. ° °If you are diabetic, please bring your blood sugar readings with you to your follow up appointment with primary care.   ° °Please call and make your follow up appointments as soon as possible.   ° °Also  Note the following: °If you experience worsening of your admission symptoms, develop shortness of breath, life threatening emergency, suicidal or homicidal thoughts you must seek medical attention immediately by calling 911 or calling your MD immediately  if symptoms less severe. ° °You must read complete instructions/literature along with all the possible adverse reactions/side effects for all the Medicines you take and that have been prescribed to you. Take any new Medicines after you have completely understood and accpet all the possible adverse reactions/side effects.  ° °Do not drive when taking Pain medications or sleeping medications (Benzodiazepines) ° °Do not take more than prescribed Pain, Sleep and Anxiety Medications. It is not advisable to combine anxiety,sleep and pain medications without talking with your primary care practitioner ° °Special Instructions: If you have smoked or chewed Tobacco  in the last 2 yrs please stop smoking, stop any regular Alcohol  and or any Recreational drug use. ° °Wear Seat belts while driving.  Do not drive if taking any narcotic, mind altering or controlled substances or recreational drugs or alcohol.  ° ° ° ° ° °

## 2022-02-14 NOTE — Discharge Summary (Addendum)
Physician Discharge Summary  Jasmine [name] KDX:[id number] DOB: [birth year] DOA: 02/04/2022  PCP: Janora Norlander, DO  Admit date: 02/04/2022 Discharge date: 02/14/2022  Admitted From: Home  Disposition: Home   Recommendations for Outpatient Follow-up:  Follow up with PCP in 1 weeks Please follow up with [facility] in 1 month   Discharge Condition: STABLE   CODE STATUS: FULL DIET: Heart health; take pancreatic enzymes with meals / snacks    Brief Hospitalization Summary: Please see all hospital notes, images, labs for full details of the hospitalization. ADMISSION HPI:  66 year old female with history of COPD, chronic respiratory failure on 6-7 L, tobacco abuse, opioid dependence secondary to chronic back pain, hypertension, hyperlipidemia,nephrolithiasis, colon polyps (TA's), GERD, idiopathic pancreatitis, abnormal pancreas imaging with dilated duct, chronically dilated bile duct, presents with a 4-day history of abdominal pain more so in her lower abdomen with associated nausea and vomiting.  The patient denies any new medications.  She denies any alcohol or illicit substances.  She has not been taking any NSAIDs.  She states that she has not been able to tolerate any of her oral medications because of her emesis.  She has not had any fevers, chills, chest pain, hematemesis, hematochezia, melena.  She states that her breathing and coughing are the same as usual.  She denies any worsening lower extremity edema.  She denies any recent sick contacts or travels.  She has had some loose stools.   Notably, the patient has been following Dr. Rush Landmark for her chronic pancreatitis.  She last saw him on 01/03/2022.  At that visit it was felt best to defer elective EUS secondary to her chronic respiratory failure and risks involved with conscious sedation.  Plans were to obtain a CT of the pancreas with pancreatic protocol.  She was restarted on pancreatic enzyme supplementation.   In the ED,  the patient had low-grade temperature of 99.3 F.  She was mildly tachycardic in 110 range and hemodynamically stable.  Oxygen saturation was 100% on 6 L.  WBC 4.8, hemoglobin 12.0, platelets 227,000.  Sodium 141, potassium 3.8, bicarbonate 28, serum creatinine 0.58.  AST 18, ALT 13, alk phosphatase 111, total bilirubin 1.0.  Troponin 8>>9.  EKG shows sinus rhythm with nonspecific ST changes.  The patient was started on IV fluids and IV opioids for her intractable nausea and vomiting.  She was admitted for further workup and treatment.    Assessment/Plan: Intractable nausea and vomiting -due to viral gastroenteritis versus acute on chronic pancreatitis -Remain n.p.o>>advanced to clears on 12/5; slowly advance to full liquid 12/7 -advanced to heart healthy diet and tolerating well -Around-the-clock antiemetics were given until symptoms improved.  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  No bowel wall thickening.  Stable CBD and pancreatic ductal dilatation. -Patient follows Dr. Rush Landmark -Restarted Creon 12/7    Chronic respiratory failure with hypoxia -She is chronically on 6 to 7 L -Stable presently -resume all home respiratory meds    Opioid dependence -PDMP reviewed -Patient receives OxyContin 15 mg, #60 monthly -Patient receives Percocet 10/325 #150 monthly -No red flags   COPD -Stable presently without exacerbation -  Continue bronchodilators--yupelri, brovana, pulmicort   Tobacco abuse -Tobacco cessation discussed   Essential hypertension - controlled -Restarted  amlodipine and Zebeta -IV hydralazine prn SBP >180   Hyperlipidemia -Restart statin at discharge     Hypokalemia -added KCl to IVF -repleted    Hypomagnesemia - repleted     Family Communication:   no Family at bedside   Consultants:  GI Discharge Diagnoses:  Principal Problem:   Acute on chronic pancreatitis ([place]) Active Problems:   Centrilobular emphysema ([place])   Cigarette smoker   Chronic respiratory failure with hypoxia (HCC)   Chronic pancreatitis (HCC)   Dilation of pancreatic duct   Opioid dependence (HCC)   Abdominal pain, epigastric   Nausea and vomiting   Discharge Instructions:  Allergies as of 02/14/2022       Reactions   Lyrica [pregabalin] Other (See Comments)   EXTREME SHAKING/TREMBLING   Darvon [propoxyphene]    UNSPECIFIED REACTION    Gabapentin    Extreme shaking and trembling    Augmentin [amoxicillin-pot Clavulanate] Nausea And Vomiting   Has patient had a PCN reaction causing immediate rash, facial/tongue/throat swelling, SOB or lightheadedness with hypotension:no Has patient had a PCN reaction causing severe rash involving mucus membranes or skin necrosis:No Has patient had a PCN reaction that required hospitalization:No Has patient had a PCN reaction occurring within the last 10 years:Yes--ONLY N/V If all of the above answers are "NO", then may proceed with Cephalosporin use.   Erythromycin Itching, Rash      Vibramycin [doxycycline Calcium] Itching, Rash   "[place] "        Medication List     STOP taking these medications    Breztri Aerosphere 160-9-4.8 MCG/ACT Aero Generic drug: Budeson-Glycopyrrol-Formoterol   mirabegron ER 25 MG Tb24 tablet Commonly known as: MYRBETRIQ   mirtazapine 15 MG tablet Commonly known as: REMERON   predniSONE 10 MG tablet Commonly known as: DELTASONE       TAKE these medications    acetaminophen 500 MG tablet Commonly known as: TYLENOL Take 1,000 mg by mouth every 6 (six) hours as  needed for headache.   albuterol 108 (90 Base) MCG/ACT inhaler Commonly known as: VENTOLIN HFA 2puffs up to every 4 hours if can't catch your breath   amLODipine 10 MG tablet Commonly known as: NORVASC Take 1 tablet (10 mg total) by mouth daily.   bisoprolol 5 MG tablet Commonly known as: ZEBETA Take 0.5 tablets (2.5 mg total) by mouth daily. Take in Place of Metoprolol   budesonide 0.25 MG/2ML nebulizer solution Commonly known as: Pulmicort Take 2 mLs (0.25 mg total) by nebulization in the morning and at bedtime.   dextromethorphan 30 MG/5ML liquid Commonly known as: DELSYM Take 15 mg by mouth as needed for cough.   diclofenac Sodium 1 % Gel Commonly known as: VOLTAREN Apply 2 g topically 4 (four) times daily.   Ensure High Protein Liqd Drink 1 can 3 times daily.   formoterol 20 MCG/2ML nebulizer solution Commonly known as: PERFOROMIST Take 2 mLs (20 mcg total) by nebulization 2 (two) times daily. Use in nebulizer twice daily perfectly regularly   magic mouthwash Soln Take 5 mLs by mouth 3 (three) times daily as needed for mouth pain.   Mucinex Maximum Strength 1200 MG Tb12 Generic drug: Guaifenesin Take 1 tablet by mouth 2 (two) times daily.   omeprazole 40 MG capsule Commonly known as: PRILOSEC Take 1 capsule (40 mg total) by mouth 2 (two) times daily with a meal.  oxybutynin 5 MG 24 hr tablet Commonly known as: DITROPAN-XL Take 5 mg by mouth daily.   oxyCODONE-acetaminophen 10-325 MG tablet Commonly known as: PERCOCET Take 1 tablet by mouth 5 (five) times daily.   OxyCONTIN 15 mg 12 hr tablet Generic drug: oxyCODONE Take 15 mg by mouth every 12 (twelve) hours.   OXYGEN Inhale 6-7 L into the lungs See admin instructions. continuous   Pancrelipase (Lip-Prot-Amyl) 24000-76000 units Cpep Take 2 capsules (48,000 Units total) by mouth See admin instructions. Take 2 caps daily with meals and 1 cap with snacks   prochlorperazine 25 MG suppository Commonly  known as: COMPAZINE Place 1 suppository (25 mg total) rectally every 12 (twelve) hours as needed for nausea or vomiting.   tiZANidine 4 MG capsule Commonly known as: ZANAFLEX Take 1 capsule (4 mg total) by mouth 3 (three) times daily as needed for muscle spasms.   traZODone 150 MG tablet Commonly known as: DESYREL Take 2 tablets (300 mg total) by mouth at bedtime. What changed: See the new instructions.   Yupelri 175 MCG/3ML nebulizer solution Generic drug: revefenacin Inhale 3 mLs (175 mcg total) into the lungs daily.        Follow-up Information     [facility]. Schedule an appointment as soon as possible for a visit in 1 month(s).   Why: Hospital Follow Up Contact information: Lillian [place], [place], DO. Schedule an appointment as soon as possible for a visit in 1 week(s).   Specialty: Family Medicine Why: Hospital Follow Up Contact information: [place] [id number] [phone]                Allergies  Allergen Reactions   Lyrica [Pregabalin] Other (See Comments)    EXTREME SHAKING/TREMBLING   Darvon [Propoxyphene]     UNSPECIFIED REACTION    Gabapentin     Extreme shaking and trembling    Augmentin [Amoxicillin-Pot Clavulanate] Nausea And Vomiting    Has patient had a PCN reaction causing immediate rash, facial/tongue/throat swelling, SOB or lightheadedness with hypotension:no Has patient had a PCN reaction causing severe rash involving mucus membranes or skin necrosis:No Has patient had a PCN reaction that required hospitalization:No Has patient had a PCN reaction occurring within the last 10 years:Yes--ONLY N/V If all of the above answers are "NO", then may proceed with Cephalosporin use.    Erythromycin Itching and Rash        Vibramycin [Doxycycline Calcium] Itching and Rash    "[place] "   Allergies as of 02/14/2022        Reactions   Lyrica [pregabalin] Other (See Comments)   EXTREME SHAKING/TREMBLING   Darvon [propoxyphene]    UNSPECIFIED REACTION    Gabapentin    Extreme shaking and trembling    Augmentin [amoxicillin-pot Clavulanate] Nausea And Vomiting   Has patient had a PCN reaction causing immediate rash, facial/tongue/throat swelling, SOB or lightheadedness with hypotension:no Has patient had a PCN reaction causing severe rash involving mucus membranes or skin necrosis:No Has patient had a PCN reaction that required hospitalization:No Has patient had a PCN reaction occurring within the last 10 years:Yes--ONLY N/V If all of the above answers are "NO", then may proceed with Cephalosporin use.   Erythromycin Itching, Rash      Vibramycin [doxycycline Calcium] Itching, Rash   "[place] "        Medication List     STOP taking  these medications    Breztri Aerosphere 160-9-4.8 MCG/ACT Aero Generic drug: Budeson-Glycopyrrol-Formoterol   mirabegron ER 25 MG Tb24 tablet Commonly known as: MYRBETRIQ   mirtazapine 15 MG tablet Commonly known as: REMERON   predniSONE 10 MG tablet Commonly known as: DELTASONE       TAKE these medications    acetaminophen 500 MG tablet Commonly known as: TYLENOL Take 1,000 mg by mouth every 6 (six) hours as needed for headache.   albuterol 108 (90 Base) MCG/ACT inhaler Commonly known as: VENTOLIN HFA 2puffs up to every 4 hours if can't catch your breath   amLODipine 10 MG tablet Commonly known as: NORVASC Take 1 tablet (10 mg total) by mouth daily.   bisoprolol 5 MG tablet Commonly known as: ZEBETA Take 0.5 tablets (2.5 mg total) by mouth daily. Take in Place of Metoprolol   budesonide 0.25 MG/2ML nebulizer solution Commonly known as: Pulmicort Take 2 mLs (0.25 mg total) by nebulization in the morning and at bedtime.   dextromethorphan 30 MG/5ML liquid Commonly known as: DELSYM Take 15 mg by mouth as needed for cough.   diclofenac Sodium  1 % Gel Commonly known as: VOLTAREN Apply 2 g topically 4 (four) times daily.   Ensure High Protein Liqd Drink 1 can 3 times daily.   formoterol 20 MCG/2ML nebulizer solution Commonly known as: PERFOROMIST Take 2 mLs (20 mcg total) by nebulization 2 (two) times daily. Use in nebulizer twice daily perfectly regularly   magic mouthwash Soln Take 5 mLs by mouth 3 (three) times daily as needed for mouth pain.   Mucinex Maximum Strength 1200 MG Tb12 Generic drug: Guaifenesin Take 1 tablet by mouth 2 (two) times daily.   omeprazole 40 MG capsule Commonly known as: PRILOSEC Take 1 capsule (40 mg total) by mouth 2 (two) times daily with a meal.   oxybutynin 5 MG 24 hr tablet Commonly known as: DITROPAN-XL Take 5 mg by mouth daily.   oxyCODONE-acetaminophen 10-325 MG tablet Commonly known as: PERCOCET Take 1 tablet by mouth 5 (five) times daily.   OxyCONTIN 15 mg 12 hr tablet Generic drug: oxyCODONE Take 15 mg by mouth every 12 (twelve) hours.   OXYGEN Inhale 6-7 L into the lungs See admin instructions. continuous   Pancrelipase (Lip-Prot-Amyl) 24000-76000 units Cpep Take 2 capsules (48,000 Units total) by mouth See admin instructions. Take 2 caps daily with meals and 1 cap with snacks   prochlorperazine 25 MG suppository Commonly known as: COMPAZINE Place 1 suppository (25 mg total) rectally every 12 (twelve) hours as needed for nausea or vomiting.   tiZANidine 4 MG capsule Commonly known as: ZANAFLEX Take 1 capsule (4 mg total) by mouth 3 (three) times daily as needed for muscle spasms.   traZODone 150 MG tablet Commonly known as: DESYREL Take 2 tablets (300 mg total) by mouth at bedtime. What changed: See the new instructions.   Yupelri 175 MCG/3ML nebulizer solution Generic drug: revefenacin Inhale 3 mLs (175 mcg total) into the lungs daily.        Procedures/Studies: CT ABDOMEN PELVIS W CONTRAST  Result Date: 02/04/2022 CLINICAL DATA:  Abdominal pain with  nausea and vomiting. EXAM: CT ABDOMEN AND PELVIS WITH CONTRAST TECHNIQUE: Multidetector CT imaging of the abdomen and pelvis was performed using the standard protocol following bolus administration of intravenous contrast. RADIATION DOSE REDUCTION: This exam was performed according to the departmental dose-optimization program which includes automated exposure control, adjustment of the mA and/or kV according to patient size and/or use of  iterative reconstruction technique. CONTRAST:  142m OMNIPAQUE IOHEXOL 300 MG/ML  SOLN COMPARISON:  November 30, 2021 FINDINGS: Lower chest: No acute abnormality. Hepatobiliary: No focal liver abnormality is seen. No gallstones or gallbladder wall thickening. The common bile duct is dilated and measures 1.5 cm in diameter. Mild to moderate severity central intrahepatic biliary dilatation is also seen. Pancreas: Diffuse atrophy of the pancreatic parenchyma is seen with subcentimeter parenchymal calcifications noted within the pancreatic head. Pancreatic ductal dilatation is noted (7.2 mm in diameter). Spleen: Normal in size without focal abnormality. Adrenals/Urinary Tract: Adrenal glands are unremarkable. Kidneys are normal in size, without renal calculi or hydronephrosis. Multiple stable, bilateral subcentimeter simple and indeterminate cysts are seen. The urinary bladder is unremarkable. Stomach/Bowel: Stomach is within normal limits. Appendix appears normal. No evidence of bowel wall thickening, distention, or inflammatory changes. Vascular/Lymphatic: Aortic atherosclerosis. No enlarged abdominal or pelvic lymph nodes. Reproductive: Status post hysterectomy. No adnexal masses. Other: No abdominal wall hernia or abnormality. No abdominopelvic ascites. Musculoskeletal: Postoperative changes are seen within the lumbar spine at the levels of L3, L4 and L5. IMPRESSION: 1. Findings consistent with chronic pancreatitis. 2. Stable, bilateral simple and indeterminate renal cysts.  Correlation with nonemergent renal ultrasound is recommended. This recommendation follows ACR consensus guidelines: Management of the Incidental Renal Mass on CT: A White Paper of the ACR Incidental Findings Committee. J Am Coll Radiol [phone] 3. Stable common bile duct and pancreatic ductal dilatation. Correlation with MRCP is recommended. 4. Postoperative changes within the lumbar spine. 5. Aortic atherosclerosis. Aortic Atherosclerosis (ICD10-I70.0). Electronically Signed   By: TVirgina NorfolkM.D.   On: 02/04/2022 22:29   DG Chest 2 View  Result Date: 02/04/2022 CLINICAL DATA:  Chest pain. EXAM: CHEST - 2 VIEW COMPARISON:  Chest radiograph dated 09/26/2021. FINDINGS: Background of emphysema and chronic interstitial coarsening. No focal consolidation, pleural effusion, pneumothorax. Right upper lobe scarring stable cardiomediastinal silhouette. Atherosclerotic calcification of the aortic arch. Thoracic spine stimulator wires. No acute osseous pathology. IMPRESSION: 1. No acute cardiopulmonary process. 2. Emphysema. Electronically Signed   By: AAnner CreteM.D.   On: 02/04/2022 22:27     Subjective: Pt tolerating diet, pain and nausea much better.   Discharge Exam: Vitals:   02/14/22 0805 02/14/22 0827  BP:  127/63  Pulse:  75  Resp:  18  Temp:  98.5 F (36.9 C)  SpO2: 100% 100%   Vitals:   02/14/22 0802 02/14/22 0803 02/14/22 0805 02/14/22 0827  BP:    127/63  Pulse:    75  Resp:    18  Temp:    98.5 F (36.9 C)  TempSrc:    Oral  SpO2: 100% 100% 100% 100%  Weight:      Height:       General:  Pt is alert, follows commands appropriately, not in acute distress HEENT: No icterus, No thrush, No neck mass, [place]/AT Cardiovascular: normal S1/S2, no rubs, no gallops Respiratory: clear to auscultation. No wheeze.  Diminshed BS bilater Abdomen: soft, normal BS, nontender, no guarding.  Extremities: No edema, No lymphangitis, No petechiae, No rashes, no synovitis The  results of significant diagnostics from this hospitalization (including imaging, microbiology, ancillary and laboratory) are listed below for reference.     Microbiology: Recent Results (from the past 240 hour(s))  SARS Coronavirus 2 by RT PCR (hospital order[facility]) *cepheid single result test* Anterior Nasal Swab     Status: None   Collection Time: 02/05/22  2:52 PM  Specimen: Anterior Nasal Swab  Result Value Ref Range Status   SARS Coronavirus 2 by RT PCR NEGATIVE NEGATIVE Final    Comment: (NOTE) SARS-CoV-2 target nucleic acids are NOT DETECTED.  The SARS-CoV-2 RNA is generally detectable in upper and lower respiratory specimens during the acute phase of infection. The lowest concentration of SARS-CoV-2 viral copies this assay can detect is 250 copies / mL. A negative result does not preclude SARS-CoV-2 infection and should not be used as the sole basis for treatment or other patient management decisions.  A negative result may occur with improper specimen collection / handling, submission of specimen other than nasopharyngeal swab, presence of viral mutation(s) within the areas targeted by this assay, and inadequate number of viral copies (<250 copies / mL). A negative result must be combined with clinical observations, patient history, and epidemiological information.  Fact Sheet for Patients:   https://www.patel.info/  Fact Sheet for Healthcare Providers: https://hall.com/  This test is not yet approved or  cleared by the [place] FDA and has been authorized for detection and/or diagnosis of SARS-CoV-2 by FDA under an Emergency Use Authorization (EUA).  This EUA will remain in effect (meaning this test can be used) for the duration of the COVID-19 declaration under Section 564(b)(1) of the Act, 21 U.S.C. section 360bbb-3(b)(1), unless the authorization is terminated or revoked  sooner.  Performed at [facility], [address] [place], [place] [address]      Labs: BNP (last 3 results) Recent Labs    09/09/21 1941 09/26/21 1900  BNP 79.0 02.7   Basic Metabolic Panel: Recent Labs  Lab 02/08/22 0541 02/09/22 0549 02/10/22 0457  NA 136 136 137  K 3.7 4.0 4.6  CL 102 101 103  CO2 _0 GLUCOSE 96 117* 101*  BUN <5* <5* 7*  CREATININE 0.60 0.44 0.53  CALCIUM 8.2* 8.5* 8.4*  MG 1.9 1.6* 2.6*   Liver Function Tests: No results for input(s): "AST", "ALT", "ALKPHOS", "BILITOT", "PROT", "ALBUMIN" in the last 168 hours. No results for input(s): "LIPASE", "AMYLASE" in the last 168 hours. No results for input(s): "AMMONIA" in the last 168 hours. CBC: Recent Labs  Lab 02/09/22 0549 02/10/22 0457  WBC 3.5* 3.9*  HGB 11.8* 10.5*  HCT 38.7 35.1*  MCV 106.3* 109.0*  PLT 232 214   Cardiac Enzymes: No results for input(s): "CKTOTAL", "CKMB", "CKMBINDEX", "TROPONINI" in the last 168 hours. BNP: Invalid input(s): "POCBNP" CBG: No results for input(s): "GLUCAP" in the last 168 hours. D-Dimer No results for input(s): "DDIMER" in the last 72 hours. Hgb A1c No results for input(s): "HGBA1C" in the last 72 hours. Lipid Profile No results for input(s): "CHOL", "HDL", "LDLCALC", "TRIG", "CHOLHDL", "LDLDIRECT" in the last 72 hours. Thyroid function studies No results for input(s): "TSH", "T4TOTAL", "T3FREE", "THYROIDAB" in the last 72 hours.  Invalid input(s): "FREET3" Anemia work up No results for input(s): "VITAMINB12", "FOLATE", "FERRITIN", "TIBC", "IRON", "RETICCTPCT" in the last 72 hours. Urinalysis    Component Value Date/Time   COLORURINE YELLOW 02/04/2022 2141   APPEARANCEUR CLEAR 02/04/2022 2141   APPEARANCEUR Clear 06/07/2021 1524   LABSPEC 1.014 02/04/2022 2141   PHURINE 6.0 02/04/2022 2141   GLUCOSEU NEGATIVE 02/04/2022 2141   HGBUR NEGATIVE 02/04/2022 2141   BILIRUBINUR NEGATIVE 02/04/2022 2141   BILIRUBINUR Negative 06/07/2021  1524   KETONESUR 20 (A) 02/04/2022 2141   PROTEINUR 100 (A) 02/04/2022 2141   NITRITE NEGATIVE 02/04/2022 2141   LEUKOCYTESUR NEGATIVE 02/04/2022 2141   Sepsis Labs  Recent Labs  Lab 02/09/22 0549 02/10/22 0457  WBC 3.5* 3.9*   Microbiology Recent Results (from the past 240 hour(s))  SARS Coronavirus 2 by RT PCR (hospital order, performed in [facility] hospital lab) *cepheid single result test* Anterior Nasal Swab     Status: None   Collection Time: 02/05/22  2:52 PM   Specimen: Anterior Nasal Swab  Result Value Ref Range Status   SARS Coronavirus 2 by RT PCR NEGATIVE NEGATIVE Final    Comment: (NOTE) SARS-CoV-2 target nucleic acids are NOT DETECTED.  The SARS-CoV-2 RNA is generally detectable in upper and lower respiratory specimens during the acute phase of infection. The lowest concentration of SARS-CoV-2 viral copies this assay can detect is 250 copies / mL. A negative result does not preclude SARS-CoV-2 infection and should not be used as the sole basis for treatment or other patient management decisions.  A negative result may occur with improper specimen collection / handling, submission of specimen other than nasopharyngeal swab, presence of viral mutation(s) within the areas targeted by this assay, and inadequate number of viral copies (<250 copies / mL). A negative result must be combined with clinical observations, patient history, and epidemiological information.  Fact Sheet for Patients:   https://www.patel.info/  Fact Sheet for Healthcare Providers: https://hall.com/  This test is not yet approved or  cleared by the [place] FDA and has been authorized for detection and/or diagnosis of SARS-CoV-2 by FDA under an Emergency Use Authorization (EUA).  This EUA will remain in effect (meaning this test can be used) for the duration of the COVID-19 declaration under Section 564(b)(1) of the Act, 21 U.S.C. section  360bbb-3(b)(1), unless the authorization is terminated or revoked sooner.  Performed at [facility], [address] [place], [place] [address]    Time coordinating discharge: 42 mins   SIGNED:  Irwin Brakeman, MD  [facility] 02/14/2022, 10:10 AM How to contact the [facility] Attending or Consulting provider [place] or covering provider during after hours [place], for this patient?  Check the care team in [facility] and look for a) attending/consulting [facility] provider listed and b) the [facility] team listed Log into [email or web address] and use [place]'s universal password to access. If you do not have the password, please contact the hospital operator. Locate the [facility] provider you are looking for under [facility] and page to a number that you can be directly reached. If you still have difficulty reaching the provider, please page the [facility] (Director on Call) for the Hospitalists listed on amion for assistance.

## 2022-02-16 ENCOUNTER — Inpatient Hospital Stay: Payer: Medicare Other | Admitting: Gastroenterology

## 2022-02-16 ENCOUNTER — Telehealth: Payer: Self-pay | Admitting: *Deleted

## 2022-02-16 NOTE — Patient Outreach (Signed)
  Care Coordination TOC Note Transition Care Management Unsuccessful Follow-up Telephone Call  Date of discharge and from where:  [facility] on 02/14/22  Attempts:  1st Attempt  Reason for unsuccessful TCM follow-up call:  No answer/busy. Generic Voicemail.  Upcoming appts:  Dr Lajuana Ripple on 02/22/22 at 2:30 Dr Melvyn Novas on 03/01/22 at 3:45 Roseanne Kaufman, NP (GI) on 03/23/22 at 12:00  Chong Sicilian, BSN, RN-BC [place]: [phone] Main #: [phone]

## 2022-02-17 ENCOUNTER — Telehealth: Payer: Self-pay | Admitting: *Deleted

## 2022-02-17 ENCOUNTER — Encounter: Payer: Self-pay | Admitting: *Deleted

## 2022-02-17 NOTE — Patient Outreach (Signed)
  Care Coordination TOC Note Transition Care Management Follow-up Telephone Call Date of discharge and from where: [facility] on 02/14/22 How have you been since you were released from the hospital? "Everything is good" Any questions or concerns? No  Items Reviewed: Did the pt receive and understand the discharge instructions provided? Yes  Medications obtained and verified? Yes  Other? No  Any new allergies since your discharge? No  Dietary orders reviewed? Yes Do you have support at home? Yes   Home Care and Equipment/Supplies: Were home health services ordered? no If so, what is the name of the agency?   Has the agency set up a time to come to the patient's home? not applicable Were any new equipment or medical supplies ordered?  No What is the name of the medical supply agency?  Were you able to get the supplies/equipment? not applicable Do you have any questions related to the use of the equipment or supplies? No  Functional Questionnaire: (I = Independent and D = Dependent) ADLs: I  Bathing/Dressing- II  Meal Prep- I  Eating- I  Maintaining continence- I  Transferring/Ambulation- I  Managing Meds- I  Follow up appointments reviewed:  PCP Hospital f/u appt confirmed? Yes  Scheduled to see Dr Lajuana Ripple on 02/22/22 at [facility] f/u appt confirmed? Yes  Scheduled to see Dr Melvyn Novas on 03/01/22 at 3:45  And Roseanne Kaufman, NP (GI) on 03/23/22 at 12:00 Are transportation arrangements needed? No  If their condition worsens, is the pt aware to call PCP or go to the Emergency Dept.? Yes Was the patient provided with contact information for the PCP's office or ED? Yes Was to pt encouraged to call back with questions or concerns? Yes  SDOH assessments and interventions completed:   Yes  SDOH Interventions Today    Flowsheet Row Most Recent Value  SDOH Interventions   Housing Interventions Intervention Not Indicated  Transportation Interventions Intervention  Not Indicated  Financial Strain Interventions Intervention Not Indicated        Care Coordination Interventions:  No Care Coordination interventions needed at this time.   Encounter Outcome:  Pt. Visit Completed    Chong Sicilian, BSN, RN-BC RN Care Coordinator [place] Direct Dial: [phone] Main #: [phone]

## 2022-02-22 ENCOUNTER — Ambulatory Visit: Payer: Medicare Other | Admitting: Family Medicine

## 2022-03-01 ENCOUNTER — Ambulatory Visit: Payer: Medicare Other | Admitting: Internal Medicine

## 2022-03-07 ENCOUNTER — Other Ambulatory Visit: Payer: Self-pay

## 2022-03-07 DIAGNOSIS — D508 Other iron deficiency anemias: Secondary | ICD-10-CM

## 2022-03-07 DIAGNOSIS — C3491 Malignant neoplasm of unspecified part of right bronchus or lung: Secondary | ICD-10-CM

## 2022-03-08 ENCOUNTER — Inpatient Hospital Stay: Payer: Medicare Other

## 2022-03-09 ENCOUNTER — Inpatient Hospital Stay: Payer: Medicare Other

## 2022-03-13 ENCOUNTER — Ambulatory Visit: Payer: Medicare Other | Admitting: Hematology

## 2022-03-23 ENCOUNTER — Telehealth: Payer: Self-pay

## 2022-03-23 ENCOUNTER — Ambulatory Visit: Payer: Medicare Other | Admitting: Gastroenterology

## 2022-03-23 DIAGNOSIS — K861 Other chronic pancreatitis: Secondary | ICD-10-CM

## 2022-03-23 NOTE — Telephone Encounter (Signed)
-----  Message from [place], [place] sent at 01/06/2022  9:52 AM EDT ----- Regarding: FW: CT scan / 2 month follow-up appt  ----- Message ----- From: Lucky Rathke, CMA Sent: 01/04/2022  12:00 AM EDT To: Lucky Rathke, CMA Subject: CT scan / 2 month follow-up appt               Patient needs CT scan and office follow up appt in 2 month.   CT scan - Pancreas  Protocol

## 2022-03-23 NOTE — Telephone Encounter (Signed)
CT ordered not sent to the schedulers yet.  Left message on machine to call back  Need appt set up after CT scan.

## 2022-03-24 NOTE — Telephone Encounter (Signed)
Left message on machine to call back  

## 2022-03-24 NOTE — Telephone Encounter (Signed)
I spoke with the pt husband and he agrees to have the CT ordered. Appt made for f/u on 3/5 with GM.  He will expect a call from the schedulers

## 2022-03-24 NOTE — Telephone Encounter (Signed)
Patient spouse calling back to f/u, please advise.

## 2022-03-28 ENCOUNTER — Inpatient Hospital Stay: Payer: Medicare Other

## 2022-03-30 ENCOUNTER — Ambulatory Visit: Payer: Medicare Other | Admitting: Gastroenterology

## 2022-04-04 ENCOUNTER — Ambulatory Visit: Payer: Medicare Other | Admitting: Hematology

## 2022-04-04 NOTE — Progress Notes (Deleted)
Subjective:     Patient ID: Jasmine [name], female   DOB: May 28, 1955    MRN: [id number]    Brief patient profile: 78  yowf   active smoker/MM  dx with asthma as child but by HS outgrew it until her 71's with flares but better between until early 2000s and maintain on multliple inhalers and then 02/2016 hosp in [place] then [facility] April 2019 and newly started on 02 = 2lpm as needed daytime and hs and referred to pulmonary clinic 08/24/2017 by Dr   Danice Goltz with GOLD II spirometry documented 09/21/17     02/19/2020  f/u ov/[place] office/Ahmon Tosi re: GOLD II/ 02 dep still smoking Chief Complaint  Patient presents with   Follow-up    Here for o2 recert- c/o waking up in the night coughing and "gagging" x 3 months. Cough is occ prod with clear sputum. She is using her albuterol inhaler about once per day.   Dyspnea: foot drop limiting / around the house doe  Cough: nightly cough/ gag 4 h after hs  and continues until gets up and uses saba in am / min clear mucus  Sleeping: bed is flat with wedge  SABA use: as above/ no neb  02: 2lpm hs  On ppi with breakfast  Rec Protonix 40 mg Take 30- 60 min before your first and last meals of the day  GERD diet reviewed, bed blocks rec  We will be scheduling you ONO on room air to qualify you for 02  Please remember to go to the  x-ray department  pos SPN CTchest 03/19/20 : 1. 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 After surgery  and lately she reports a cough with clear mucus.  - MR   Dyspnea:  limited by R foot drop / using rollator  Cough: still coughing thick clear mucus esp in am / using delyms  Sleeping: bed is flat/ some am congestion  SABA use: too much  02: 4lpm 24/7  Rec For cough/ congestion > mucinex dm 1200 mg every 12 hours as needed and use flutter valve  The key is to stop smoking completely before smoking completely stops you! Plan A = Automatic = Always=  Symbicort 160 / spiriva 2 of each then 12 hours later 2 puffs of symbicort  Plan B = Backup (to supplement plan A, not to replace it) Only use your albuterol inhaler as a rescue medication  Plan C = Crisis (instead of Plan B but only if Plan B stops working) - only use your albuterol nebulizer if you first try Plan B and it fails to help  Ok to try albuterol 15 min before an activity (on alternating days)  that you know would usually make you short of breath  We will contact [facility] to do a  best fit evaluation  - late add did not need amb 02 based on today's eval   Make sure you check your oxygen saturation  at your highest level of activity  to be sure it stays over 90% and adjust  02 flow upward to maintain this level if needed but remember to turn it back to previous settings when you stop (to conserve your supply).   Please schedule a follow up visit in 3 months but call sooner if needed   Admit date: 09/26/2021   Discharge date: 09/29/2021   Admitted From:Home     Brief/Interim Summary: Jasmine [name] is a 67 y.o. female with medical history significant of allergies, anemia, COPD, smoker, GERD, hypertension, lung cancer, and more presents ED with a chief complaint of dyspnea.  Patient has been admitted for acute COPD exacerbation and remained on her usual 5 L nasal cannula oxygen, but required several days of IV Solu-Medrol and breathing treatments as well as brief use of Zithromax to help improve her symptoms.  Chest x-ray did not  demonstrate any acute findings.  She now feels as though she is back to baseline and is in stable condition for discharge and will resume breathing treatments as needed at home and will continue on prednisone as prescribed for several more days.  She was also seen by palliative care while admitted and will be set up with outpatient palliative services.   Discharge Diagnoses:  Principal Problem:   COPD exacerbation ([place])   HTN (hypertension)   Cigarette smoker   Chronic respiratory failure with hypoxia (HCC)   GERD (gastroesophageal reflux disease)   Chronic pain syndrome   Chronic pancreatitis ([place])   Hyperlipidemia  10/19/2021  f/u ov/[place] office/Maki Hege re: GOLD 2/ 02 dep  maint on wix/spiriva   Chief Complaint  Patient presents with   Follow-up    On 5LO2 cont 24/7  Feels breathing is worse since last ov.  On wixella inhaler needs refill on albuterol   Dyspnea: Rollator due R sided weakness  Cough: rattling /congested sounding cough min mucoid sputum Sleeping: recliner x 45 degrees x 3 months  SABA use: albuterol 8 x per day / neb twice daily not prechallenging  02: 5lpm 24/7  Rec Stop wexilla and spiriva and breztri Take 2 puffs first thing in am and then another 2 puffs about 12 hours later.  For cough >  mucinex dm 1200 mg every 12 hours and use the flutter valve as much as you can - do not take delsym Plan A = Automatic = Always=    breztri Take 2 puffs first thing in am and then another 2 puffs about 12 hours later.   Work on inhaler technique:  - remember how  golfers warm up - take practice breaths with your empty inhalers  Plan B = Backup (to supplement plan A, not to replace it) Only use your albuterol inhaler as a rescue medication Plan C = Crisis (instead of Plan B but only if Plan B stops working) - only use your albuterol nebulizer if you first try Plan B and it fails to help > ok to use the nebulizer up to every 4 hours but if start needing it regularly call for  immediate appointment Plan D = Deltasone  - if plan ABC not working > Prednisone 10 mg take  4 each am x 2 days,   2 each am x 2 days,  1 each am x 2 days and stop  ( this  is a refillable prescription) The key is to stop smoking completely before smoking completely stops you! Please schedule a follow up office visit in 2 weeks, sooner if needed  with all medications /inhalers/ solutions in hand     01/11/2022  f/u ov/[place] office/Catalena Stanhope re: GOLD 2  maint on breztri  and pred x 6 d as plan D finished 2 days  prior to OV helped some / still smoking  Chief Complaint  Patient presents with   Follow-up    Breathing has worsened since last ov   Dyspnea:  only goes out for doctor visits/ using 4 wheeled high walker room to room at home and worse on breztri but hfa quite poor technique Cough: nothing purulent  Sleeping: recliner/ 45 degrees x years  SABA use: saba once or twice daily /had been using neb every 4 hours for a week  02: 4-6 lpm  sleeping on 6lpm  Covid status: vax max  Rec For cough >  mucinex dm 1200 mg every 12 hours and use the flutter valve as much as you can - do not take delsym Plan A = Automatic = Always=  performist/yupelri and budesonide nebulized in am and repeat perfomist and budesonide 12 hours later (stay on Breztri until you have them)  Plan B = Backup (to supplement plan A, not to replace it) Only use your albuterol inhaler as a rescue medication  Plan C = Crisis (instead of Plan B but only if Plan B stops working) - only use your albuterol nebulizer if you first try Plan B  Plan D = Deltasone  - if plan ABC not working > Prednisone 10 mg take  4 each am x 2 days,   2 each am x 2 days,  1 each am x 2 days and stop  ( this is a refillable prescription) The key is to stop smoking completely before smoking completely stops you! Please schedule a follow up office visit in 6 weeks, sooner if needed  with all medications /inhalers/ solutions in hand    04/05/2022  f/u  ov/ office/Jaisha [place]ac[place] re: *** maint on ***  No chief complaint on file.   Dyspnea:  *** Cough: *** Sleeping: *** SABA use: *** 02: *** Covid status: *** Lung cancer screening: ***   No obvious day to day or daytime variability or assoc excess/ purulent sputum or mucus plugs or hemoptysis or cp or chest tightness, subjective wheeze or overt sinus or hb symptoms.   *** without nocturnal  or early am exacerbation  of respiratory  c/o's or need for noct saba. Also denies any obvious fluctuation of symptoms with weather or environmental changes or other aggravating or alleviating factors except as outlined above   No unusual exposure hx or h/o childhood pna/ asthma or knowledge of premature birth.  Current Allergies, Complete Past Medical History, Past Surgical History, Family History, and Social History were reviewed in [employer] record.  ROS  The following are not active complaints unless bolded Hoarseness, sore throat, dysphagia, dental problems, itching, sneezing,  nasal congestion or discharge of excess mucus or purulent secretions, ear ache,   fever, chills, sweats, unintended wt loss or wt gain, classically pleuritic or exertional cp,  orthopnea pnd or arm/hand swelling  or leg swelling, presyncope, palpitations, abdominal pain, anorexia, nausea, vomiting, diarrhea  or change in bowel habits or change in bladder habits, change in stools or change in urine, dysuria, hematuria,  rash, arthralgias, visual complaints, headache, numbness, weakness or  ataxia or problems with walking or coordination,  change in mood or  memory.        No outpatient medications have been marked as taking for the 04/05/22 encounter (Appointment) with Tanda Rockers, MD.                        Objective:   Physical Exam   Wts  04/05/2022           ***  01/11/2022           116  11/02/2021           109  10/19/2021           108  11/03/2020         92 02/19/2020       96   08/05/2019           98.6  01/09/2018        105  09/21/2017        104   08/24/17 105 lb 9.6 oz (47.9 kg)  07/31/17 106 lb (48.1 kg)  07/26/17 105 lb 3.2 oz (47.7 kg)    Vital signs reviewed  04/05/2022  - Note at rest 02 sats  ***% on ***   General appearance:    ***     Mod bar ***                                Assessment:

## 2022-04-05 ENCOUNTER — Ambulatory Visit: Payer: Medicare Other | Admitting: Internal Medicine

## 2022-04-10 ENCOUNTER — Ambulatory Visit (INDEPENDENT_AMBULATORY_CARE_PROVIDER_SITE_OTHER): Payer: Medicare Other

## 2022-04-10 VITALS — Ht 63.0 in | Wt 99.0 lb

## 2022-04-10 DIAGNOSIS — Z0000 Encounter for general adult medical examination without abnormal findings: Secondary | ICD-10-CM | POA: Diagnosis not present

## 2022-04-10 NOTE — Patient Instructions (Signed)
[name] , Thank you for taking time to come for your Medicare Wellness Visit. I appreciate your ongoing commitment to your health goals. Please review the following plan we discussed and let me know if I can assist you in the future.   These are the goals we discussed:  Goals      DIET - INCREASE WATER INTAKE     Patient Stated     03/29/2020 AWV Goal: Fall Prevention  Over the next year, patient will decrease their risk for falls by: Using assistive devices, such as a cane or walker, as needed Identifying fall risks within their home and correcting them by: Removing throw rugs Adding handrails to stairs or ramps Removing clutter and keeping a clear pathway throughout the home Increasing light, especially at night Adding shower handles/bars Raising toilet seat Identifying potential personal risk factors for falls: Medication side effects Incontinence/urgency Vestibular dysfunction Hearing loss Musculoskeletal disorders Neurological disorders Orthostatic hypotension       Quit Smoking        This is a list of the screening recommended for you and due dates:  Health Maintenance  Topic Date Due   COVID-19 Vaccine (4 - 2023-24 season) 11/04/2021   Zoster (Shingles) Vaccine (2 of 2) 05/24/2022*   Flu Shot  06/04/2022*   Hepatitis C Screening: USPSTF Recommendation to screen - Ages 18-79 yo.  07/12/2022*   Mammogram  02/23/2023*   DEXA scan (bone density measurement)  07/13/2022   Medicare Annual Wellness Visit  04/11/2023   Colon Cancer Screening  06/15/2023   DTaP/Tdap/Td vaccine (2 - Td or Tdap) 05/30/2030   Pneumonia Vaccine  Completed   HPV Vaccine  Aged Out  *Topic was postponed. The date shown is not the original due date.    Advanced directives: In Chart   Conditions/risks identified: Aim for 30 minutes of exercise or brisk walking, 6-8 glasses of water, and 5 servings of fruits and vegetables each day.   Next appointment: Follow up in one year for your  annual wellness visit    Preventive Care 65 Years and Older, Female Preventive care refers to lifestyle choices and visits with your health care provider that can promote health and wellness. What does preventive care include? A yearly physical exam. This is also called an annual well check. Dental exams once or twice a year. Routine eye exams. Ask your health care provider how often you should have your eyes checked. Personal lifestyle choices, including: Daily care of your teeth and gums. Regular physical activity. Eating a healthy diet. Avoiding tobacco and drug use. Limiting alcohol use. Practicing safe sex. Taking low-dose aspirin every day. Taking vitamin and mineral supplements as recommended by your health care provider. What happens during an annual well check? The services and screenings done by your health care provider during your annual well check will depend on your age, overall health, lifestyle risk factors, and family history of disease. Counseling  Your health care provider may ask you questions about your: Alcohol use. Tobacco use. Drug use. Emotional well-being. Home and relationship well-being. Sexual activity. Eating habits. History of falls. Memory and ability to understand (cognition). Work and work Statistician. Reproductive health. Screening  You may have the following tests or measurements: Height, weight, and BMI. Blood pressure. Lipid and cholesterol levels. These may be checked every 5 years, or more frequently if you are over 28 years old. Skin check. Lung cancer screening. You may have this screening every year starting at age 58 if you  have a 30-pack-year history of smoking and currently smoke or have quit within the past 15 years. Fecal occult blood test (FOBT) of the stool. You may have this test every year starting at age 35. Flexible sigmoidoscopy or colonoscopy. You may have a sigmoidoscopy every 5 years or a colonoscopy every 10 years  starting at age 83. Hepatitis C blood test. Hepatitis B blood test. Sexually transmitted disease (STD) testing. Diabetes screening. This is done by checking your blood sugar (glucose) after you have not eaten for a while (fasting). You may have this done every 1-3 years. Bone density scan. This is done to screen for osteoporosis. You may have this done starting at age 45. Mammogram. This may be done every 1-2 years. Talk to your health care provider about how often you should have regular mammograms. Talk with your health care provider about your test results, treatment options, and if necessary, the need for more tests. Vaccines  Your health care provider may recommend certain vaccines, such as: Influenza vaccine. This is recommended every year. Tetanus, diphtheria, and acellular pertussis (Tdap, Td) vaccine. You may need a Td booster every 10 years. Zoster vaccine. You may need this after age 89. Pneumococcal 13-valent conjugate (PCV13) vaccine. One dose is recommended after age 54. Pneumococcal polysaccharide (PPSV23) vaccine. One dose is recommended after age 40. Talk to your health care provider about which screenings and vaccines you need and how often you need them. This information is not intended to replace advice given to you by your health care provider. Make sure you discuss any questions you have with your health care provider. Document Released: 03/19/2015 Document Revised: 11/10/2015 Document Reviewed: 12/22/2014 Elsevier Interactive Patient Education  2017 [employer] Prevention in the Home Falls can cause injuries. They can happen to people of all ages. There are many things you can do to make your home safe and to help prevent falls. What can I do on the outside of my home? Regularly fix the edges of walkways and driveways and fix any cracks. Remove anything that might make you trip as you walk through a door, such as a raised step or threshold. Trim any bushes or  trees on the path to your home. Use bright outdoor lighting. Clear any walking paths of anything that might make someone trip, such as rocks or tools. Regularly check to see if handrails are loose or broken. Make sure that both sides of any steps have handrails. Any raised decks and porches should have guardrails on the edges. Have any leaves, snow, or ice cleared regularly. Use sand or salt on walking paths during winter. Clean up any spills in your garage right away. This includes oil or grease spills. What can I do in the bathroom? Use night lights. Install grab bars by the toilet and in the tub and shower. Do not use towel bars as grab bars. Use non-skid mats or decals in the tub or shower. If you need to sit down in the shower, use a plastic, non-slip stool. Keep the floor dry. Clean up any water that spills on the floor as soon as it happens. Remove soap buildup in the tub or shower regularly. Attach bath mats securely with double-sided non-slip rug tape. Do not have throw rugs and other things on the floor that can make you trip. What can I do in the bedroom? Use night lights. Make sure that you have a light by your bed that is easy to reach. Do not use  any sheets or blankets that are too big for your bed. They should not hang down onto the floor. Have a firm chair that has side arms. You can use this for support while you get dressed. Do not have throw rugs and other things on the floor that can make you trip. What can I do in the kitchen? Clean up any spills right away. Avoid walking on wet floors. Keep items that you use a lot in easy-to-reach places. If you need to reach something above you, use a strong step stool that has a grab bar. Keep electrical cords out of the way. Do not use floor polish or wax that makes floors slippery. If you must use wax, use non-skid floor wax. Do not have throw rugs and other things on the floor that can make you trip. What can I do with my  stairs? Do not leave any items on the stairs. Make sure that there are handrails on both sides of the stairs and use them. Fix handrails that are broken or loose. Make sure that handrails are as long as the stairways. Check any carpeting to make sure that it is firmly attached to the stairs. Fix any carpet that is loose or worn. Avoid having throw rugs at the top or bottom of the stairs. If you do have throw rugs, attach them to the floor with carpet tape. Make sure that you have a light switch at the top of the stairs and the bottom of the stairs. If you do not have them, ask someone to add them for you. What else can I do to help prevent falls? Wear shoes that: Do not have high heels. Have rubber bottoms. Are comfortable and fit you well. Are closed at the toe. Do not wear sandals. If you use a stepladder: Make sure that it is fully opened. Do not climb a closed stepladder. Make sure that both sides of the stepladder are locked into place. Ask someone to hold it for you, if possible. Clearly mark and make sure that you can see: Any grab bars or handrails. First and last steps. Where the edge of each step is. Use tools that help you move around (mobility aids) if they are needed. These include: Canes. Walkers. Scooters. Crutches. Turn on the lights when you go into a dark area. Replace any light bulbs as soon as they burn out. Set up your furniture so you have a clear path. Avoid moving your furniture around. If any of your floors are uneven, fix them. If there are any pets around you, be aware of where they are. Review your medicines with your doctor. Some medicines can make you feel dizzy. This can increase your chance of falling. Ask your doctor what other things that you can do to help prevent falls. This information is not intended to replace advice given to you by your health care provider. Make sure you discuss any questions you have with your health care provider. Document  Released: 12/17/2008 Document Revised: 07/29/2015 Document Reviewed: 03/27/2014 Elsevier Interactive Patient Education  2017 [employer].

## 2022-04-10 NOTE — Progress Notes (Signed)
Subjective:   Jasmine [name] is a 67 y.o. female who presents for Medicare Annual (Subsequent) preventive examination. I connected with  SARHA BARTELT on 04/10/22 by a audio enabled telemedicine application and verified that I am speaking with the correct person using two identifiers.  Patient Location: Home  Provider Location: Home Office  I discussed the limitations of evaluation and management by telemedicine. The patient expressed understanding and agreed to proceed.  Review of Systems     Cardiac Risk Factors include: smoking/ tobacco exposure;advanced age (>3men, >8 women)     Objective:    Today's Vitals   04/10/22 1417  Weight: 99 lb (44.9 kg)  Height: 5\' 3"  (1.6 m)   Body mass index is 17.54 kg/m.     04/10/2022    2:21 PM 02/05/2022    7:24 PM 02/04/2022    6:48 PM 09/27/2021    1:00 AM 09/26/2021    6:03 PM 09/09/2021    7:43 PM 08/03/2021    1:15 PM  Advanced Directives  Does Patient Have a Medical Advance Directive? Yes Yes Yes  No No Yes  Type of Paramedic of [place];Living will [place];Living will [place];Living will    Living will  Does patient want to make changes to medical advance directive? No - Patient declined No - Patient declined       Copy of [place] in Chart? Yes - validated most recent copy scanned in chart (See row information) No - copy requested       Would patient like information on creating a medical advance directive?    No - Patient declined  No - Patient declined     Current Medications (verified) Outpatient Encounter Medications as of 04/10/2022  Medication Sig   acetaminophen (TYLENOL) 500 MG tablet Take 1,000 mg by mouth every 6 (six) hours as needed for headache.   albuterol (VENTOLIN HFA) 108 (90 Base) MCG/ACT inhaler 2puffs up to every 4 hours if can't catch your breath   amLODipine (NORVASC) 10 MG tablet Take 1 tablet (10 mg total) by mouth  daily.   bisoprolol (ZEBETA) 5 MG tablet Take 0.5 tablets (2.5 mg total) by mouth daily. Take in Place of Metoprolol   budesonide (PULMICORT) 0.25 MG/2ML nebulizer solution Take 2 mLs (0.25 mg total) by nebulization in the morning and at bedtime.   dextromethorphan (DELSYM) 30 MG/5ML liquid Take 15 mg by mouth as needed for cough.   diclofenac Sodium (VOLTAREN) 1 % GEL Apply 2 g topically 4 (four) times daily.   formoterol (PERFOROMIST) 20 MCG/2ML nebulizer solution Take 2 mLs (20 mcg total) by nebulization 2 (two) times daily. Use in nebulizer twice daily perfectly regularly   Guaifenesin (MUCINEX MAXIMUM STRENGTH) 1200 MG TB12 Take 1 tablet by mouth 2 (two) times daily.   magic mouthwash SOLN Take 5 mLs by mouth 3 (three) times daily as needed for mouth pain.   Nutritional Supplements (ENSURE HIGH PROTEIN) LIQD Drink 1 can 3 times daily.   omeprazole (PRILOSEC) 40 MG capsule Take 1 capsule (40 mg total) by mouth 2 (two) times daily with a meal.   oxybutynin (DITROPAN-XL) 5 MG 24 hr tablet Take 5 mg by mouth daily.   oxyCODONE-acetaminophen (PERCOCET) 10-325 MG tablet Take 1 tablet by mouth 5 (five) times daily.   OXYCONTIN 15 MG 12 hr tablet Take 15 mg by mouth every 12 (twelve) hours.   OXYGEN Inhale 6-7 L into the  lungs See admin instructions. continuous   Pancrelipase, Lip-Prot-Amyl, 24000-76000 units CPEP Take 2 capsules (48,000 Units total) by mouth See admin instructions. Take 2 caps daily with meals and 1 cap with snacks   predniSONE (DELTASONE) 10 MG tablet Take by mouth.   prochlorperazine (COMPAZINE) 25 MG suppository Place 1 suppository (25 mg total) rectally every 12 (twelve) hours as needed for nausea or vomiting.   revefenacin (YUPELRI) 175 MCG/3ML nebulizer solution Inhale 3 mLs (175 mcg total) into the lungs daily.   tiZANidine (ZANAFLEX) 4 MG capsule Take 1 capsule (4 mg total) by mouth 3 (three) times daily as needed for muscle spasms.   traZODone (DESYREL) 150 MG tablet Take 2  tablets (300 mg total) by mouth at bedtime.   metoCLOPramide (REGLAN) 5 MG tablet Take 1 tablet (5 mg total) by mouth every 6 (six) hours as needed for nausea.   No facility-administered encounter medications on file as of 04/10/2022.    Allergies (verified) Lyrica [pregabalin], Darvon [propoxyphene], Gabapentin, Augmentin [amoxicillin-pot clavulanate], Erythromycin, and Vibramycin [doxycycline calcium]   History: Past Medical History:  Diagnosis Date   Allergy    Anemia    Anxiety    Arthritis    Asthma    Carpal tunnel syndrome    Chronic back pain    Cigarette smoker 6/76/1950   Complication of anesthesia    in the past has had N/V, the last few surgeries she has not been   COPD (chronic obstructive pulmonary disease) ([place])    Easy bruising 07/10/2016   GERD (gastroesophageal reflux disease)    Headache    Heart murmur 2005   never had any problems   History of blood transfusion 2018   History of kidney stones    Hypertension    Hypoglycemia    Hypoglycemia    Iron deficiency anemia 07/11/2016   Neuropathy    both arms   Normocytic anemia 07/29/2015   Pancreatitis    Pneumonia    PONV (postoperative nausea and vomiting)    Postoperative anemia due to acute blood loss 11/15/2015   R lung cancer    Sciatica    Seizures ([place])    as a child when she would have an asthma attack. none as an adult   Shortness of breath dyspnea    Past Surgical History:  Procedure Laterality Date   ABDOMINAL HYSTERECTOMY  9326   APPLICATION OF ROBOTIC ASSISTANCE FOR SPINAL PROCEDURE N/A 09/05/2016   Procedure: APPLICATION OF ROBOTIC ASSISTANCE FOR SPINAL PROCEDURE;  Surgeon: Consuella Lose, MD;  Location: [facility];  Service: Neurosurgery;  Laterality: N/A;   BACK SURGERY     BIOPSY  06/14/2020   Procedure: BIOPSY;  Surgeon: Daneil Dolin, MD;  [facility];  Service: Endoscopy;;   [place]   COLONOSCOPY     unsure last   COLONOSCOPY WITH  PROPOFOL N/A 06/14/2020   Procedure: COLONOSCOPY WITH PROPOFOL;  Surgeon: Daneil Dolin, MD;  [facility];  Service: Endoscopy;  Laterality: N/A;  PM   cyst removal face     disk repair     neck and lumbar region   ESOPHAGOGASTRODUODENOSCOPY (EGD) WITH PROPOFOL N/A 06/14/2020   Procedure: ESOPHAGOGASTRODUODENOSCOPY (EGD) WITH PROPOFOL;  Surgeon: Daneil Dolin, MD;  [facility];  Service: Endoscopy;  Laterality: N/A;   FOOT SURGERY Bilateral    to file bones down    HARDWARE REMOVAL N/A 09/05/2016   Procedure:  HARDWARE REMOVAL AND REPLACEMENT OF LUMBAR FIVE SCREWS;  Surgeon: Consuella Lose, MD;  Location: [place];  Service: Neurosurgery;  Laterality: N/A;   IMPLANTATION / PLACEMENT EPIDURAL NEUROSTIMULATOR ELECTRODES     INTERCOSTAL NERVE BLOCK Right 07/29/2020   Procedure: INTERCOSTAL NERVE BLOCK;  Surgeon: Melrose Nakayama, MD;  Location: [place];  Service: Thoracic;  Laterality: Right;   LAMINECTOMY     2006   lobectomy Right 07/2020   LUMBAR FUSION     LYMPH NODE BIOPSY Right 07/29/2020   Procedure: LYMPH NODE BIOPSY;  Surgeon: Melrose Nakayama, MD;  Location: [facility];  Service: Thoracic;  Laterality: Right;   POLYPECTOMY  06/14/2020   Procedure: POLYPECTOMY INTESTINAL;  Surgeon: Daneil Dolin, MD;  [facility];  Service: Endoscopy;;   SPINAL CORD STIMULATOR BATTERY EXCHANGE     SPINAL CORD STIMULATOR INSERTION N/A 07/05/2016   Procedure: REPLACEMENT OF LUMBAR SPINAL CORD STIMULATOR BATTERY;  Surgeon: Newman Pies, MD;  Location: [facility];  Service: Neurosurgery;  Laterality: N/A;   TONSILLECTOMY     Family History  Problem Relation Age of Onset   Heart disease Mother    Diabetes Mother    Hypertension Mother    Hyperlipidemia Mother    COPD Mother        smoked   Cancer Mother        bile duct   COPD Father    Diabetes Father    Heart disease Father    Cancer Father        throat   Dementia Father    Hypertension Sister     Hyperlipidemia Sister    Hypertension Brother    Hyperlipidemia Brother    Hypertension Brother    Hyperlipidemia Brother    Emphysema Maternal Grandmother        smoked   Asthma Son    Colon cancer Neg Hx    Colon polyps Neg Hx    Pancreatic cancer Neg Hx    Esophageal cancer Neg Hx    Inflammatory bowel disease Neg Hx    Liver disease Neg Hx    Rectal cancer Neg Hx    Stomach cancer Neg Hx    Social History   Socioeconomic History   Marital status: Married    Spouse name: [name]   Number of children: 1   Years of education: Not on file   Highest education level: Some college, no degree  Occupational History   Occupation: disability  Tobacco Use   Smoking status: Every Day    Packs/day: 0.25    Years: 35.00    Total pack years: 8.75    Types: Cigarettes   Smokeless tobacco: Never   Tobacco comments:    trying to quit  Vaping Use   Vaping Use: Never used  Substance and Sexual Activity   Alcohol use: Yes    Alcohol/week: 2.0 standard drinks of alcohol    Types: 2 Glasses of wine per week    Comment: occasionally   Drug use: No   Sexual activity: Yes    Birth control/protection: None  Other Topics Concern   Not on file  Social History Narrative   Retired, 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 1 son, deceased. 2 pet dogs. Enjoys watching TV.   Husband takes care of cooking, cleaning, driving, and helps her with ADLs including bathing   Social Determinants of Health   Financial Resource Strain: Low Risk  (04/10/2022)   Overall Financial  Resource Strain (CARDIA)    Difficulty of Paying Living Expenses: Not hard at all  Food Insecurity: No Food Insecurity (04/10/2022)   Hunger Vital Sign    Worried About Running Out of Food in the Last Year: Never true    Ran Out of Food in the Last Year: Never true  Transportation Needs: No Transportation Needs (04/10/2022)   PRAPARE - Hydrologist (Medical): No    Lack of Transportation  (Non-Medical): No  Physical Activity: Inactive (04/10/2022)   Exercise Vital Sign    Days of Exercise per Week: 0 days    Minutes of Exercise per Session: 0 min  Stress: No Stress Concern Present (04/10/2022)   [place]    Feeling of Stress : Not at all  Social Connections: Moderately Integrated (04/10/2022)   Social Connection and Isolation Panel [NHANES]    Frequency of Communication with Friends and Family: More than three times a week    Frequency of Social Gatherings with Friends and Family: More than three times a week    Attends Religious Services: 1 to 4 times per year    Active Member of [employer] or Organizations: No    Attends Music therapist: Never    Marital Status: Married    Tobacco Counseling Ready to quit: No Counseling given: Not Answered Tobacco comments: trying to quit   Clinical Intake:  Pre-visit preparation completed: Yes  Pain : No/denies pain     Nutritional Risks: None Diabetes: No  How often do you need to have someone help you when you read instructions, pamphlets, or other written materials from your doctor or pharmacy?: 1 - Never  Diabetic?no   Interpreter Needed?: No  Information entered by :: Jadene Pierini, LPN   Activities of Daily Living    04/10/2022    2:22 PM 02/05/2022    7:29 PM  In your present state of health, do you have any difficulty performing the following activities:  Hearing? 1   Vision? 1   Difficulty concentrating or making decisions? 1   Walking or climbing stairs? 1   Dressing or bathing? 1   Doing errands, shopping? 1 0  Preparing Food and eating ? Y   Using the Toilet? Y   In the past six months, have you accidently leaked urine? Y   Do you have problems with loss of bowel control? Y   Managing your Medications? Y   Managing your Finances? Y   Housekeeping or managing your Housekeeping? Y     Patient Care Team: Janora Norlander,  DO as PCP - General (Family Medicine) Melrose Nakayama, MD as Consulting Physician (Cardiothoracic Surgery) Derek Jack, MD as Consulting Physician (Hematology) Tanda Rockers, MD as Consulting Physician (Pulmonary Disease) Brien Mates, RN as Oncology Nurse Navigator (Oncology)  Indicate any recent Medical Services you may have received from other than [facility] providers in the past year (date may be approximate).     Assessment:   This is a routine wellness examination for Ilynn.  Hearing/Vision screen Vision Screening - Comments:: Wears rx glasses - up to date with routine eye exams with  Dr.Davis   Dietary issues and exercise activities discussed: Current Exercise Habits: The patient does not participate in regular exercise at present, Exercise limited by: neurologic condition(s)   Goals Addressed             This Visit's Progress    DIET -  INCREASE WATER INTAKE         Depression Screen    04/10/2022    2:21 PM 11/14/2021    1:55 PM 10/26/2021   10:57 AM 09/26/2021    4:07 PM 07/11/2021    1:39 PM 06/07/2021    2:06 PM 04/01/2021    2:52 PM  PHQ 2/9 Scores  PHQ - 2 Score 1 2 2 2 4 2  0  PHQ- 9 Score  7 5 5 13 7      Fall Risk    04/10/2022    2:19 PM 11/14/2021    1:04 PM 10/26/2021   10:54 AM 09/26/2021    4:07 PM 07/11/2021    1:27 PM  Fall Risk   Falls in the past year? 1 1 1 1 1   Number falls in past yr: 1 1 1 1 1   Injury with Fall? 1 0 0 0 1  Risk for fall due to : History of fall(s);Impaired balance/gait;Orthopedic patient History of fall(s) History of fall(s) History of fall(s) History of fall(s)  Follow up Education provided;Falls prevention discussed Falls evaluation completed Falls evaluation completed Education provided Education provided    [place]:  Any stairs in or around the home? Yes  If so, are there any without handrails? No  Home free of loose throw rugs in walkways, pet beds, electrical cords,  etc? Yes  Adequate lighting in your home to reduce risk of falls? Yes   ASSISTIVE DEVICES UTILIZED TO PREVENT FALLS:  Life alert? No  Use of a cane, walker or w/c? Yes  Grab bars in the bathroom? Yes  Shower chair or bench in shower? Yes  Elevated toilet seat or a handicapped toilet? Yes       04/10/2022    2:22 PM 04/01/2021    3:12 PM 03/29/2020   11:17 AM  6CIT Screen  What Year? 0 points 0 points 0 points  What month? 0 points 0 points 0 points  What time? 0 points 0 points 0 points  Count back from 20 0 points 0 points 2 points  Months in reverse 0 points 0 points 0 points  Repeat phrase 0 points 2 points 0 points  Total Score 0 points 2 points 2 points    Immunizations Immunization History  Administered Date(s) Administered   Fluad Quad(high Dose 65+) 12/02/2018, 03/10/2020, 01/10/2021   Influenza, Quadrivalent, Recombinant, Inj, Pf 01/16/2017   Influenza,inj,Quad PF,6+ Mos 02/22/2015, 01/03/2016   Influenza-Unspecified 12/04/2017   Moderna Sars-Covid-2 Vaccination 05/29/2019, 06/29/2019   PFIZER(Purple Top)SARS-COV-2 Vaccination 03/30/2020   Pneumococcal Conjugate-13 08/20/2019   Pneumococcal Polysaccharide-23 11/05/2020   Tdap 05/29/2020   Zoster Recombinat (Shingrix) 03/30/2020    TDAP status: Up to date  Flu Vaccine status: Due, Education has been provided regarding the importance of this vaccine. Advised may receive this vaccine at local pharmacy or Health Dept. Aware to provide a copy of the vaccination record if obtained from local pharmacy or Health Dept. Verbalized acceptance and understanding.  Pneumococcal vaccine status: Up to date  Covid-19 vaccine status: Completed vaccines  Qualifies for Shingles Vaccine? Yes   Zostavax completed Yes   Shingrix Completed?: Yes  Screening Tests Health Maintenance  Topic Date Due   COVID-19 Vaccine (4 - 2023-24 season) 11/04/2021   Zoster Vaccines- Shingrix (2 of 2) 05/24/2022 (Originally 05/25/2020)    INFLUENZA VACCINE  06/04/2022 (Originally 10/04/2021)   Hepatitis C Screening  07/12/2022 (Originally 08/23/1973)   MAMMOGRAM  02/23/2023 (Originally 01/10/2022)  DEXA SCAN  07/13/2022   Medicare Annual Wellness (AWV)  04/11/2023   COLONOSCOPY (Pts 45-33yrs Insurance coverage will need to be confirmed)  06/15/2023   DTaP/Tdap/Td (2 - Td or Tdap) 05/30/2030   Pneumonia Vaccine 78+ Years old  Completed   HPV VACCINES  Aged Out    Health Maintenance  Health Maintenance Due  Topic Date Due   COVID-19 Vaccine (4 - 2023-24 season) 11/04/2021    Colorectal cancer screening: Type of screening: Colonoscopy. Completed 06/14/2020. Repeat every 3 years  Mammogram status: Ordered declined . Pt provided with contact info and advised to call to schedule appt.   Bone Density status: Completed 07/12/2020. Results reflect: Bone density results: OSTEOPOROSIS. Repeat every 2 years.  Lung Cancer Screening: (Low Dose CT Chest recommended if Age 48-80 years, 30 pack-year currently smoking OR have quit w/in 15years.) does not qualify.   Lung Cancer Screening Referral: n/a  Additional Screening:  Hepatitis C Screening: does qualify;   Vision Screening: Recommended annual ophthalmology exams for early detection of glaucoma and other disorders of the eye. Is the patient up to date with their annual eye exam?  Yes  Who is the provider or what is the name of the office in which the patient attends annual eye exams? Dr.Davis  If pt is not established with a provider, would they like to be referred to a provider to establish care? No .   Dental Screening: Recommended annual dental exams for proper oral hygiene  Community Resource Referral / Chronic Care Management: CRR required this visit?  No   CCM required this visit?  No      Plan:     I have personally reviewed and noted the following in the patient's chart:   Medical and social history Use of alcohol, tobacco or illicit drugs  Current  medications and supplements including opioid prescriptions. Patient is currently taking opioid prescriptions. Information provided to patient regarding non-opioid alternatives. Patient advised to discuss non-opioid treatment plan with their provider. Functional ability and status Nutritional status Physical activity Advanced directives List of other physicians Hospitalizations, surgeries, and ER visits in previous 12 months Vitals Screenings to include cognitive, depression, and falls Referrals and appointments  In addition, I have reviewed and discussed with patient certain preventive protocols, quality metrics, and best practice recommendations. A written personalized care plan for preventive services as well as general preventive health recommendations were provided to patient.     Daphane Shepherd, LPN   04/09/2681   Nurse Notes: Due Flu Vaccine

## 2022-04-15 ENCOUNTER — Other Ambulatory Visit: Payer: Self-pay | Admitting: Gastroenterology

## 2022-05-03 ENCOUNTER — Ambulatory Visit: Payer: Medicare Other | Admitting: Gastroenterology

## 2022-05-08 ENCOUNTER — Ambulatory Visit (HOSPITAL_COMMUNITY): Payer: Medicare Other

## 2022-05-08 ENCOUNTER — Encounter (HOSPITAL_COMMUNITY): Payer: Self-pay

## 2022-05-09 ENCOUNTER — Ambulatory Visit: Payer: Medicare Other | Admitting: Gastroenterology

## 2022-05-15 ENCOUNTER — Other Ambulatory Visit: Payer: Self-pay | Admitting: Internal Medicine

## 2022-05-17 ENCOUNTER — Ambulatory Visit: Payer: Medicare Other | Admitting: Internal Medicine

## 2022-05-17 NOTE — Progress Notes (Deleted)
Subjective:     Patient ID: Jasmine [name], female   DOB: Nov 19, 1955    MRN: [id number]    Brief patient profile: 67  yowf   active smoker/MM  dx with asthma as child but by HS outgrew it until her 45's with flares but better between until early 2000s and maintain on multliple inhalers and then 02/2016 hosp in [place] then [facility] April 2019 and newly started on 02 = 2lpm as needed daytime and hs and referred to pulmonary clinic 08/24/2017 by Dr   Danice Goltz with GOLD II spirometry documented 09/21/17     02/19/2020  f/u ov/[place] office/Jasmine [name] re: GOLD II/ 02 dep still smoking Chief Complaint  Patient presents with   Follow-up    Here for o2 recert- c/o waking up in the night coughing and "gagging" x 3 months. Cough is occ prod with clear sputum. She is using her albuterol inhaler about once per day.   Dyspnea: foot drop limiting / around the house doe  Cough: nightly cough/ gag 4 h after hs  and continues until gets up and uses saba in am / min clear mucus  Sleeping: bed is flat with wedge  SABA use: as above/ no neb  02: 2lpm hs  On ppi with breakfast  Rec Protonix 40 mg Take 30- 60 min before your first and last meals of the day  GERD diet reviewed, bed blocks rec  We will be scheduling you ONO on room air to qualify you for 02  Please remember to go to the  x-ray department  pos SPN CTchest 03/19/20 : 1. 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 After surgery  and lately she reports a cough with clear mucus.  - MR   Dyspnea:  limited by R foot drop / using rollator  Cough: still coughing thick clear mucus esp in am / using delyms  Sleeping: bed is flat/ some am congestion  SABA use: too much  02: 4lpm 24/7  Rec For cough/ congestion > mucinex dm 1200 mg every 12 hours as needed and use flutter valve  The key is to stop smoking completely before smoking completely stops you! Plan A = Automatic = Always=  Symbicort 160 / spiriva 2 of each then 12 hours later 2 puffs of symbicort  Plan B = Backup (to supplement plan A, not to replace it) Only use your albuterol inhaler as a rescue medication  Plan C = Crisis (instead of Plan B but only if Plan B stops working) - only use your albuterol nebulizer if you first try Plan B and it fails to help  Ok to try albuterol 15 min before an activity (on alternating days)  that you know would usually make you short of breath  We will contact [facility] to do a  best fit evaluation  - late add did not need amb 02 based on today's eval   Make sure you check your oxygen saturation  at your highest level of activity  to be sure it stays over 90% and adjust  02 flow upward to maintain this level if needed but remember to turn it back to previous settings when you stop (to conserve your supply).   Please schedule a follow up visit in 3 months but call sooner if needed   Admit date: 09/26/2021   Discharge date: 09/29/2021   Admitted From:Home     Brief/Interim Summary: Jasmine [name] is a 67 y.o. female with medical history significant of allergies, anemia, COPD, smoker, GERD, hypertension, lung cancer, and more presents ED with a chief complaint of dyspnea.  Patient has been admitted for acute COPD exacerbation and remained on her usual 5 L nasal cannula oxygen, but required several days of IV Solu-Medrol and breathing treatments as well as brief use of Zithromax to help improve her symptoms.  Chest x-ray did not  demonstrate any acute findings.  She now feels as though she is back to baseline and is in stable condition for discharge and will resume breathing treatments as needed at home and will continue on prednisone as prescribed for several more days.  She was also seen by palliative care while admitted and will be set up with outpatient palliative services.   Discharge Diagnoses:  Principal Problem:   COPD exacerbation ([place])   HTN (hypertension)   Cigarette smoker   Chronic respiratory failure with hypoxia (HCC)   GERD (gastroesophageal reflux disease)   Chronic pain syndrome   Chronic pancreatitis ([place])   Hyperlipidemia  10/19/2021  f/u ov/[place] office/Jasmine [name] re: GOLD 2/ 02 dep  maint on wix/spiriva   Chief Complaint  Patient presents with   Follow-up    On 5LO2 cont 24/7  Feels breathing is worse since last ov.  On wixella inhaler needs refill on albuterol   Dyspnea: Rollator due R sided weakness  Cough: rattling /congested sounding cough min mucoid sputum Sleeping: recliner x 45 degrees x 3 months  SABA use: albuterol 8 x per day / neb twice daily not prechallenging  02: 5lpm 24/7  Rec Stop wexilla and spiriva and breztri Take 2 puffs first thing in am and then another 2 puffs about 12 hours later.  For cough >  mucinex dm 1200 mg every 12 hours and use the flutter valve as much as you can - do not take delsym Plan A = Automatic = Always=    breztri Take 2 puffs first thing in am and then another 2 puffs about 12 hours later.   Work on inhaler technique:  - remember how  golfers warm up - take practice breaths with your empty inhalers  Plan B = Backup (to supplement plan A, not to replace it) Only use your albuterol inhaler as a rescue medication Plan C = Crisis (instead of Plan B but only if Plan B stops working) - only use your albuterol nebulizer if you first try Plan B and it fails to help > ok to use the nebulizer up to every 4 hours but if start needing it regularly call for  immediate appointment Plan D = Deltasone  - if plan ABC not working > Prednisone 10 mg take  4 each am x 2 days,   2 each am x 2 days,  1 each am x 2 days and stop  ( this  is a refillable prescription) The key is to stop smoking completely before smoking completely stops you! Please schedule a follow up office visit in 2 weeks, sooner if needed  with all medications /inhalers/ solutions in hand     01/11/2022  f/u ov/[place] office/Jasmine [name] re: GOLD 2  maint on breztri  and pred x 6 d as plan D finished 2 days  prior to OV helped some / still smoking  Chief Complaint  Patient presents with   Follow-up    Breathing has worsened since last ov   Dyspnea:  only goes out for doctor visits/ using 4 wheeled high walker room to room at home and worse on breztri but hfa quite poor technique Cough: nothing purulent  Sleeping: recliner/ 45 degrees x years  SABA use: saba once or twice daily /had been using neb every 4 hours for a week  02: 4-6 lpm  sleeping on 6lpm  Covid status: vax max  Rec For cough >  mucinex dm 1200 mg every 12 hours and use the flutter valve as much as you can - do not take delsym Plan A = Automatic = Always=  performist/yupelri and budesonide nebulized in am and repeat perfomist and budesonide 12 hours later (stay on Breztri until you have them)  Plan B = Backup (to supplement plan A, not to replace it)Only use your albuterol inhaler as a rescue medication Plan C = Crisis (instead of Plan B but only if Plan B stops working) - only use your albuterol nebulizer if you first try Plan B  Plan D = Deltasone  - if plan ABC not working > Prednisone 10 mg take  4 each am x 2 days,   2 each am x 2 days,  1 each am x 2 days and stop  ( this is a refillable prescription The key is to stop smoking completely before smoking completely stops you!      Please schedule a follow up office visit in 6 weeks, sooner if needed  with all medications /inhalers/ solutions in hand    05/17/2022   f/u ov/[place] office/Jasmine [name] re: *** maint on ***  No chief complaint on file.   Dyspnea:  *** Cough: *** Sleeping: *** SABA use: *** 02: *** Covid status: *** Lung cancer screening: ***   No obvious day to day or daytime variability or assoc excess/ purulent sputum or mucus plugs or hemoptysis or cp or chest tightness, subjective wheeze or overt sinus or hb symptoms.   *** without nocturnal  or early am exacerbation  of respiratory  c/o's or need for noct saba. Also denies any obvious fluctuation of symptoms with weather or environmental changes or other aggravating or alleviating factors except as outlined above   No unusual exposure hx or h/o childhood pna/ asthma or knowledge of premature birth.  Current Allergies, Complete Past Medical History, Past Surgical History, Family History, and Social History were reviewed in [employer] record.  ROS  The following are not active complaints unless bolded Hoarseness, sore throat, dysphagia, dental problems, itching, sneezing,  nasal congestion or discharge of excess mucus or purulent secretions, ear ache,   fever, chills, sweats, unintended wt loss or wt gain, classically pleuritic or exertional cp,  orthopnea pnd or arm/hand swelling  or leg swelling, presyncope, palpitations, abdominal pain, anorexia, nausea, vomiting, diarrhea  or change in bowel habits or change in bladder habits, change in stools or change in urine, dysuria, hematuria,  rash, arthralgias, visual complaints, headache,  numbness, weakness or ataxia or problems with walking or coordination,  change in mood or  memory.        No outpatient medications have been marked as taking for the 05/17/22 encounter (Appointment) with Tanda Rockers, MD.                         Objective:   Physical Exam   Wts  05/17/2022          ***  01/11/2022          116  11/02/2021           109  10/19/2021           108  11/03/2020         92 02/19/2020        96  08/05/2019           98.6  01/09/2018        105  09/21/2017        104   08/24/17 105 lb 9.6 oz (47.9 kg)  07/31/17 106 lb (48.1 kg)  07/26/17 105 lb 3.2 oz (47.7 kg)    Vital signs reviewed  05/17/2022  - Note at rest 02 sats  ***% on ***   General appearance:    ***    Mod barr ***                         Assessment:

## 2022-05-22 ENCOUNTER — Telehealth: Payer: Self-pay | Admitting: Gastroenterology

## 2022-05-22 NOTE — Telephone Encounter (Signed)
Patient's husband called and stopped by the office.  He stated the CT scan needed to be rescheduled

## 2022-05-22 NOTE — Telephone Encounter (Signed)
See 03/23/22 phone note. [facility] ordered the CT, they will need to call them

## 2022-05-24 ENCOUNTER — Telehealth: Payer: Self-pay | Admitting: Family Medicine

## 2022-05-24 NOTE — Telephone Encounter (Signed)
Contacted [facility] to schedule their annual wellness visit. Appointment made for 04/12/2023.  Thank you,  Colletta Maryland,  [place] [place] [facility] Direct Dial ??[phone]

## 2022-05-25 ENCOUNTER — Ambulatory Visit: Payer: Medicare Other | Admitting: Gastroenterology

## 2022-05-31 ENCOUNTER — Emergency Department (HOSPITAL_COMMUNITY): Payer: Medicare Other

## 2022-05-31 ENCOUNTER — Inpatient Hospital Stay (HOSPITAL_COMMUNITY)
Admission: EM | Admit: 2022-05-31 | Discharge: 2022-06-06 | DRG: 177 | Disposition: A | Discharge status: Home Health | Payer: Medicare Other | Attending: Internal Medicine | Admitting: Internal Medicine

## 2022-05-31 ENCOUNTER — Encounter (HOSPITAL_COMMUNITY): Payer: Self-pay | Admitting: Emergency Medicine

## 2022-05-31 DIAGNOSIS — K838 Other specified diseases of biliary tract: Secondary | ICD-10-CM | POA: Diagnosis present

## 2022-05-31 DIAGNOSIS — E43 Unspecified severe protein-calorie malnutrition: Secondary | ICD-10-CM | POA: Diagnosis present

## 2022-05-31 DIAGNOSIS — R112 Nausea with vomiting, unspecified: Secondary | ICD-10-CM | POA: Diagnosis present

## 2022-05-31 DIAGNOSIS — Z85118 Personal history of other malignant neoplasm of bronchus and lung: Secondary | ICD-10-CM | POA: Diagnosis not present

## 2022-05-31 DIAGNOSIS — D539 Nutritional anemia, unspecified: Secondary | ICD-10-CM | POA: Diagnosis present

## 2022-05-31 DIAGNOSIS — G894 Chronic pain syndrome: Secondary | ICD-10-CM | POA: Diagnosis present

## 2022-05-31 DIAGNOSIS — J432 Centrilobular emphysema: Secondary | ICD-10-CM | POA: Diagnosis present

## 2022-05-31 DIAGNOSIS — F1190 Opioid use, unspecified, uncomplicated: Secondary | ICD-10-CM | POA: Diagnosis present

## 2022-05-31 DIAGNOSIS — J441 Chronic obstructive pulmonary disease with (acute) exacerbation: Secondary | ICD-10-CM | POA: Diagnosis present

## 2022-05-31 DIAGNOSIS — J189 Pneumonia, unspecified organism: Secondary | ICD-10-CM | POA: Diagnosis not present

## 2022-05-31 DIAGNOSIS — Z7951 Long term (current) use of inhaled steroids: Secondary | ICD-10-CM

## 2022-05-31 DIAGNOSIS — Z902 Acquired absence of lung [part of]: Secondary | ICD-10-CM

## 2022-05-31 DIAGNOSIS — Z83438 Family history of other disorder of lipoprotein metabolism and other lipidemia: Secondary | ICD-10-CM

## 2022-05-31 DIAGNOSIS — F17210 Nicotine dependence, cigarettes, uncomplicated: Secondary | ICD-10-CM | POA: Diagnosis not present

## 2022-05-31 DIAGNOSIS — K529 Noninfective gastroenteritis and colitis, unspecified: Secondary | ICD-10-CM | POA: Diagnosis present

## 2022-05-31 DIAGNOSIS — F1120 Opioid dependence, uncomplicated: Secondary | ICD-10-CM | POA: Diagnosis present

## 2022-05-31 DIAGNOSIS — J9611 Chronic respiratory failure with hypoxia: Secondary | ICD-10-CM | POA: Diagnosis present

## 2022-05-31 DIAGNOSIS — E876 Hypokalemia: Secondary | ICD-10-CM | POA: Diagnosis not present

## 2022-05-31 DIAGNOSIS — Z9981 Dependence on supplemental oxygen: Secondary | ICD-10-CM

## 2022-05-31 DIAGNOSIS — Z825 Family history of asthma and other chronic lower respiratory diseases: Secondary | ICD-10-CM | POA: Diagnosis not present

## 2022-05-31 DIAGNOSIS — I10 Essential (primary) hypertension: Secondary | ICD-10-CM | POA: Diagnosis present

## 2022-05-31 DIAGNOSIS — Z87442 Personal history of urinary calculi: Secondary | ICD-10-CM | POA: Diagnosis not present

## 2022-05-31 DIAGNOSIS — F419 Anxiety disorder, unspecified: Secondary | ICD-10-CM | POA: Diagnosis present

## 2022-05-31 DIAGNOSIS — Z833 Family history of diabetes mellitus: Secondary | ICD-10-CM

## 2022-05-31 DIAGNOSIS — G8929 Other chronic pain: Secondary | ICD-10-CM | POA: Diagnosis present

## 2022-05-31 DIAGNOSIS — Z681 Body mass index (BMI) 19 or less, adult: Secondary | ICD-10-CM

## 2022-05-31 DIAGNOSIS — Z8249 Family history of ischemic heart disease and other diseases of the circulatory system: Secondary | ICD-10-CM | POA: Diagnosis not present

## 2022-05-31 DIAGNOSIS — Z79899 Other long term (current) drug therapy: Secondary | ICD-10-CM

## 2022-05-31 DIAGNOSIS — Z881 Allergy status to other antibiotic agents status: Secondary | ICD-10-CM

## 2022-05-31 DIAGNOSIS — A09 Infectious gastroenteritis and colitis, unspecified: Secondary | ICD-10-CM | POA: Diagnosis present

## 2022-05-31 DIAGNOSIS — Z888 Allergy status to other drugs, medicaments and biological substances status: Secondary | ICD-10-CM

## 2022-05-31 DIAGNOSIS — J690 Pneumonitis due to inhalation of food and vomit: Secondary | ICD-10-CM | POA: Diagnosis present

## 2022-05-31 DIAGNOSIS — K219 Gastro-esophageal reflux disease without esophagitis: Secondary | ICD-10-CM | POA: Diagnosis present

## 2022-05-31 DIAGNOSIS — R918 Other nonspecific abnormal finding of lung field: Secondary | ICD-10-CM | POA: Diagnosis present

## 2022-05-31 DIAGNOSIS — E785 Hyperlipidemia, unspecified: Secondary | ICD-10-CM | POA: Diagnosis present

## 2022-05-31 DIAGNOSIS — K861 Other chronic pancreatitis: Secondary | ICD-10-CM | POA: Diagnosis not present

## 2022-05-31 DIAGNOSIS — C3491 Malignant neoplasm of unspecified part of right bronchus or lung: Secondary | ICD-10-CM | POA: Diagnosis not present

## 2022-05-31 LAB — LIPASE, BLOOD: Lipase: 18 U/L (ref 11–51)

## 2022-05-31 LAB — CBC WITH DIFFERENTIAL/PLATELET
Abs Immature Granulocytes: 0.1 10*3/uL — ABNORMAL HIGH (ref 0.00–0.07)
Basophils Absolute: 0 10*3/uL (ref 0.0–0.1)
Basophils Relative: 0 %
Eosinophils Absolute: 0.4 10*3/uL (ref 0.0–0.5)
Eosinophils Relative: 3 %
HCT: 38.9 % (ref 36.0–46.0)
Hemoglobin: 11.2 g/dL — ABNORMAL LOW (ref 12.0–15.0)
Immature Granulocytes: 1 %
Lymphocytes Relative: 4 %
Lymphs Abs: 0.5 10*3/uL — ABNORMAL LOW (ref 0.7–4.0)
MCH: 33.2 pg (ref 26.0–34.0)
MCHC: 28.8 g/dL — ABNORMAL LOW (ref 30.0–36.0)
MCV: 115.4 fL — ABNORMAL HIGH (ref 80.0–100.0)
Monocytes Absolute: 0.9 10*3/uL (ref 0.1–1.0)
Monocytes Relative: 8 %
Neutro Abs: 9.8 10*3/uL — ABNORMAL HIGH (ref 1.7–7.7)
Neutrophils Relative %: 84 %
Platelets: 269 10*3/uL (ref 150–400)
RBC: 3.37 MIL/uL — ABNORMAL LOW (ref 3.87–5.11)
RDW: 14 % (ref 11.5–15.5)
WBC: 11.7 10*3/uL — ABNORMAL HIGH (ref 4.0–10.5)
nRBC: 0 % (ref 0.0–0.2)

## 2022-05-31 LAB — BLOOD GAS, VENOUS
Acid-Base Excess: 2.8 mmol/L — ABNORMAL HIGH (ref 0.0–2.0)
Bicarbonate: 29.4 mmol/L — ABNORMAL HIGH (ref 20.0–28.0)
Drawn by: 7012
O2 Saturation: 38.2 %
Patient temperature: 37.4
pCO2, Ven: 53 mmHg (ref 44–60)
pH, Ven: 7.35 (ref 7.25–7.43)
pO2, Ven: <31 mmHg — CL (ref 32–45)

## 2022-05-31 LAB — COMPREHENSIVE METABOLIC PANEL
ALT: 10 U/L (ref 0–44)
AST: 11 U/L — ABNORMAL LOW (ref 15–41)
Albumin: 1.7 g/dL — ABNORMAL LOW (ref 3.5–5.0)
Alkaline Phosphatase: 92 U/L (ref 38–126)
Anion gap: 9 (ref 5–15)
BUN: <5 mg/dL — ABNORMAL LOW (ref 8–23)
CO2: 19 mmol/L — ABNORMAL LOW (ref 22–32)
Calcium: 5.7 mg/dL — CL (ref 8.9–10.3)
Chloride: 112 mmol/L — ABNORMAL HIGH (ref 98–111)
Creatinine, Ser: 0.35 mg/dL — ABNORMAL LOW (ref 0.44–1.00)
GFR, Estimated: >60 mL/min (ref >60)
Glucose, Bld: 100 mg/dL — ABNORMAL HIGH (ref 70–99)
Potassium: <2 mmol/L — CL (ref 3.5–5.1)
Sodium: 140 mmol/L (ref 135–145)
Total Bilirubin: 0.9 mg/dL (ref 0.3–1.2)
Total Protein: 4.1 g/dL — ABNORMAL LOW (ref 6.5–8.1)

## 2022-05-31 LAB — MAGNESIUM: Magnesium: 1.1 mg/dL — ABNORMAL LOW (ref 1.7–2.4)

## 2022-05-31 MED ORDER — LEVALBUTEROL HCL 0.63 MG/3ML IN NEBU: 0.6300 mg | INHALATION_SOLUTION | Freq: Four times a day (QID) | RESPIRATORY_TRACT | Status: DC | PRN

## 2022-05-31 MED ORDER — POTASSIUM CHLORIDE 10 MEQ/100ML IV SOLN
10.0000 meq | Freq: Once | INTRAVENOUS | Status: AC
  Administered 2022-05-31: 10 meq via INTRAVENOUS
  Filled 2022-05-31: qty 100

## 2022-05-31 MED ORDER — POTASSIUM CHLORIDE CRYS ER 20 MEQ PO TBCR
40.0000 meq | EXTENDED_RELEASE_TABLET | Freq: Four times a day (QID) | ORAL | Status: AC
  Administered 2022-05-31 – 2022-06-01 (×2): 40 meq via ORAL
  Filled 2022-05-31 (×2): qty 2

## 2022-05-31 MED ORDER — ENSURE ENLIVE PO LIQD
237.0000 mL | Freq: Two times a day (BID) | ORAL | Status: DC
  Administered 2022-06-01 – 2022-06-02 (×4): 237 mL via ORAL
  Filled 2022-05-31 (×6): qty 237

## 2022-05-31 MED ORDER — PIPERACILLIN-TAZOBACTAM 3.375 G IVPB
3.3750 g | Freq: Three times a day (TID) | INTRAVENOUS | Status: DC
  Administered 2022-05-31 – 2022-06-01 (×2): 3.375 g via INTRAVENOUS
  Filled 2022-05-31 (×2): qty 50

## 2022-05-31 MED ORDER — METRONIDAZOLE 500 MG/100ML IV SOLN
500.0000 mg | Freq: Once | INTRAVENOUS | Status: AC
  Administered 2022-05-31: 500 mg via INTRAVENOUS
  Filled 2022-05-31: qty 100

## 2022-05-31 MED ORDER — CIPROFLOXACIN IN D5W 400 MG/200ML IV SOLN
400.0000 mg | Freq: Once | INTRAVENOUS | Status: AC
  Administered 2022-05-31: 400 mg via INTRAVENOUS
  Filled 2022-05-31: qty 200

## 2022-05-31 MED ORDER — POTASSIUM CHLORIDE 10 MEQ/100ML IV SOLN
10.0000 meq | INTRAVENOUS | Status: DC
  Administered 2022-05-31: 10 meq via INTRAVENOUS
  Filled 2022-05-31: qty 100

## 2022-05-31 MED ORDER — BUDESONIDE 0.25 MG/2ML IN SUSP
0.2500 mg | Freq: Two times a day (BID) | RESPIRATORY_TRACT | Status: DC
  Administered 2022-05-31 – 2022-06-05 (×11): 0.25 mg via RESPIRATORY_TRACT
  Filled 2022-05-31 (×12): qty 2

## 2022-05-31 MED ORDER — OXYCODONE HCL ER 15 MG PO T12A
15.0000 mg | EXTENDED_RELEASE_TABLET | Freq: Two times a day (BID) | ORAL | Status: DC
  Administered 2022-06-01 – 2022-06-06 (×11): 15 mg via ORAL
  Filled 2022-05-31 (×11): qty 1

## 2022-05-31 MED ORDER — OXYCODONE-ACETAMINOPHEN 5-325 MG PO TABS: 1.0000 | ORAL_TABLET | Freq: Every day | ORAL | Status: DC | PRN

## 2022-05-31 MED ORDER — AMLODIPINE BESYLATE 5 MG PO TABS
10.0000 mg | ORAL_TABLET | Freq: Every day | ORAL | Status: DC
  Administered 2022-06-01 – 2022-06-06 (×6): 10 mg via ORAL
  Filled 2022-05-31 (×6): qty 2

## 2022-05-31 MED ORDER — HEPARIN SODIUM (PORCINE) 5000 UNIT/ML IJ SOLN
5000.0000 [IU] | Freq: Three times a day (TID) | INTRAMUSCULAR | Status: DC
  Administered 2022-06-01: 5000 [IU] via SUBCUTANEOUS
  Filled 2022-05-31: qty 1

## 2022-05-31 MED ORDER — NICOTINE 21 MG/24HR TD PT24
21.0000 mg | MEDICATED_PATCH | Freq: Every day | TRANSDERMAL | Status: DC
  Administered 2022-05-31 – 2022-06-06 (×7): 21 mg via TRANSDERMAL
  Filled 2022-05-31 (×7): qty 1

## 2022-05-31 MED ORDER — SODIUM CHLORIDE 0.9 % IV BOLUS
1000.0000 mL | Freq: Once | INTRAVENOUS | Status: AC
  Administered 2022-05-31: 1000 mL via INTRAVENOUS

## 2022-05-31 MED ORDER — PANTOPRAZOLE SODIUM 40 MG PO TBEC
40.0000 mg | DELAYED_RELEASE_TABLET | Freq: Every day | ORAL | Status: DC
  Administered 2022-05-31 – 2022-06-06 (×7): 40 mg via ORAL
  Filled 2022-05-31 (×10): qty 1

## 2022-05-31 MED ORDER — IPRATROPIUM-ALBUTEROL 0.5-2.5 (3) MG/3ML IN SOLN
3.0000 mL | Freq: Once | RESPIRATORY_TRACT | Status: AC
  Administered 2022-05-31: 3 mL via RESPIRATORY_TRACT
  Filled 2022-05-31: qty 3

## 2022-05-31 MED ORDER — CALCIUM GLUCONATE-NACL 1-0.675 GM/50ML-% IV SOLN
1.0000 g | Freq: Once | INTRAVENOUS | Status: AC
  Administered 2022-05-31: 1000 mg via INTRAVENOUS
  Filled 2022-05-31: qty 50

## 2022-05-31 MED ORDER — IOHEXOL 300 MG/ML  SOLN
100.0000 mL | Freq: Once | INTRAMUSCULAR | Status: AC | PRN
  Administered 2022-05-31: 100 mL via INTRAVENOUS

## 2022-05-31 MED ORDER — PANCRELIPASE (LIP-PROT-AMYL) 12000-38000 UNITS PO CPEP
48000.0000 [IU] | ORAL_CAPSULE | Freq: Two times a day (BID) | ORAL | Status: DC
  Administered 2022-06-01: 48000 [IU] via ORAL
  Filled 2022-05-31: qty 1

## 2022-05-31 MED ORDER — METHYLPREDNISOLONE SODIUM SUCC 125 MG IJ SOLR
80.0000 mg | Freq: Every day | INTRAMUSCULAR | Status: DC
  Administered 2022-05-31 – 2022-06-01 (×2): 80 mg via INTRAVENOUS
  Filled 2022-05-31 (×2): qty 2

## 2022-05-31 MED ORDER — OXYCODONE-ACETAMINOPHEN 10-325 MG PO TABS: 1.0000 | ORAL_TABLET | Freq: Every day | ORAL | Status: DC | PRN

## 2022-05-31 MED ORDER — MAGNESIUM SULFATE 2 GM/50ML IV SOLN
2.0000 g | Freq: Once | INTRAVENOUS | Status: AC
  Administered 2022-05-31: 2 g via INTRAVENOUS
  Filled 2022-05-31: qty 50

## 2022-05-31 MED ORDER — ONDANSETRON HCL 4 MG/2ML IJ SOLN
4.0000 mg | Freq: Once | INTRAMUSCULAR | Status: AC
  Administered 2022-05-31: 4 mg via INTRAVENOUS
  Filled 2022-05-31: qty 2

## 2022-05-31 MED ORDER — PANCRELIPASE (LIP-PROT-AMYL) 24000-76000 UNITS PO CPEP
2.0000 | ORAL_CAPSULE | Freq: Two times a day (BID) | ORAL | Status: DC
  Filled 2022-05-31 (×3): qty 2

## 2022-05-31 MED ORDER — ALBUTEROL SULFATE (2.5 MG/3ML) 0.083% IN NEBU
2.5000 mg | INHALATION_SOLUTION | Freq: Once | RESPIRATORY_TRACT | Status: AC
  Administered 2022-05-31: 2.5 mg via RESPIRATORY_TRACT
  Filled 2022-05-31: qty 3

## 2022-05-31 MED ORDER — PANCRELIPASE (LIP-PROT-AMYL) 12000-38000 UNITS PO CPEP: 24000.0000 [IU] | ORAL_CAPSULE | ORAL | Status: DC | PRN

## 2022-05-31 MED ORDER — HYDRALAZINE HCL 20 MG/ML IJ SOLN: 5.0000 mg | INTRAMUSCULAR | Status: DC | PRN

## 2022-05-31 MED ORDER — POTASSIUM CHLORIDE 10 MEQ/100ML IV SOLN
10.0000 meq | INTRAVENOUS | Status: AC
  Administered 2022-05-31 – 2022-06-01 (×4): 10 meq via INTRAVENOUS
  Filled 2022-05-31 (×4): qty 100

## 2022-05-31 MED ORDER — OXYCODONE HCL 5 MG PO TABS
5.0000 mg | ORAL_TABLET | Freq: Every day | ORAL | Status: DC | PRN
  Administered 2022-06-01: 5 mg via ORAL
  Filled 2022-05-31: qty 1

## 2022-05-31 NOTE — Progress Notes (Signed)
Pharmacy Antibiotic Note  Jasmine [name] is a 67 y.o. female admitted on 05/31/2022 with colitis and aspiration pneumonia.  Pharmacy has been consulted for zosyn dosing.  Has had 6 weeks of abdominal pain w/ nausea/vomiting. WBC 11.7, afebrile. Scr 0.35.   Plan: Zosyn 3.375g IV q8h (4 hour infusion). Monitor renal fx, cx results, clinical pic     Temp (24hrs), Avg:99.3 F (37.4 C), Min:99.3 F (37.4 C), Max:99.3 F (37.4 C)  Recent Labs  Lab 05/31/22 1721  WBC 11.7*  CREATININE 0.35*    CrCl cannot be calculated (Unknown ideal weight.).    Allergies  Allergen Reactions   Lyrica [Pregabalin] Other (See Comments)    EXTREME SHAKING/TREMBLING   Darvon [Propoxyphene]     UNSPECIFIED REACTION    Gabapentin     Extreme shaking and trembling    Augmentin [Amoxicillin-Pot Clavulanate] Nausea And Vomiting    Has patient had a PCN reaction causing immediate rash, facial/tongue/throat swelling, SOB or lightheadedness with hypotension:no Has patient had a PCN reaction causing severe rash involving mucus membranes or skin necrosis:No Has patient had a PCN reaction that required hospitalization:No Has patient had a PCN reaction occurring within the last 10 years:Yes--ONLY N/V If all of the above answers are "NO", then may proceed with Cephalosporin use.    Erythromycin Itching and Rash        Vibramycin [Doxycycline Calcium] Itching and Rash    "[place] "    Antimicrobials this admission: Ciprofloxacin/metronidazole 3/27 x1 Zosyn 3/27 >>   Dose adjustments this admission: N/A   Microbiology results: 3/27 GI Cx: pending 3/27 C Diff Cx: pending   Thank you for allowing pharmacy to be a part of this patient's care.  Antonietta Jewel, PharmD, [facility] Clinical Pharmacist  Phone: [phone] 05/31/2022 9:03 PM  Please check AMION for all [employer] phone numbers After 10:00 PM, call [place] [phone]

## 2022-05-31 NOTE — ED Triage Notes (Signed)
Pt here from home with c/o abd pain and n/v/d times 6 weeks , pt is on 6L n/c otc and still smokes

## 2022-05-31 NOTE — ED Provider Notes (Signed)
[facility] Provider Note   CSN: [id number] Arrival date & time: 05/31/22  1656     History {Add pertinent medical, surgical, social history, OB history to HPI:1} No chief complaint on file.   Jasmine [name] is a 67 y.o. female.  Patient has a history of chronic pancreatitis.  She has had diarrhea for 6 weeks and feels weak.  Patient also has COPD and is on 6 L at home   Abdominal Pain      Home Medications Prior to Admission medications   Medication Sig Start Date End Date Taking? Authorizing Provider  acetaminophen (TYLENOL) 500 MG tablet Take 1,000 mg by mouth every 6 (six) hours as needed for headache.   Yes [provider]  albuterol (VENTOLIN HFA) 108 (90 Base) MCG/ACT inhaler 2puffs up to every 4 hours if can't catch your breath 01/11/22  Yes Tanda Rockers, MD  amLODipine (NORVASC) 10 MG tablet Take 1 tablet (10 mg total) by mouth daily. 09/26/21  Yes Ronnie Doss M, DO  mirtazapine (REMERON) 15 MG tablet Take by mouth. 09/26/21  Yes [provider]  nystatin (MYCOSTATIN) 100000 UNIT/ML suspension Take by mouth. 10/27/21  Yes [provider]  bisoprolol (ZEBETA) 5 MG tablet Take 0.5 tablets (2.5 mg total) by mouth daily. Take in Place of Metoprolol 08/07/21   Emokpae, Courage, MD  BREZTRI AEROSPHERE 160-9-4.8 MCG/ACT AERO Inhale 2 puffs into the lungs 2 (two) times daily.    [provider]  budesonide (PULMICORT) 0.25 MG/2ML nebulizer solution Take 2 mLs (0.25 mg total) by nebulization in the morning and at bedtime. 01/11/22   Tanda Rockers, MD  dextromethorphan (DELSYM) 30 MG/5ML liquid Take 15 mg by mouth as needed for cough.    [provider]  diclofenac Sodium (VOLTAREN) 1 % GEL Apply 2 g topically 4 (four) times daily. 09/05/21   [provider]  formoterol (PERFOROMIST) 20 MCG/2ML nebulizer solution Take 2 mLs (20 mcg total) by nebulization 2 (two) times daily. Use in  nebulizer twice daily perfectly regularly 01/11/22   Tanda Rockers, MD  Guaifenesin [facility] MAXIMUM STRENGTH) 1200 MG TB12 Take 1 tablet by mouth 2 (two) times daily.    [provider]  magic mouthwash SOLN Take 5 mLs by mouth 3 (three) times daily as needed for mouth pain. 10/26/21   Janora Norlander, DO  metoCLOPramide (REGLAN) 5 MG tablet Take 1 tablet (5 mg total) by mouth every 6 (six) hours as needed for nausea. 02/14/22 03/16/22  Murlean Iba, MD  Nutritional Supplements (ENSURE HIGH PROTEIN) LIQD Drink 1 can 3 times daily. 08/30/20   Janora Norlander, DO  omeprazole (PRILOSEC) 40 MG capsule TAKE 1 CAPSULE BY MOUTH 1 TO 2 TIMES DAILY BEFORE A MEAL 04/17/22   Venetia Night L, NP  oxybutynin (DITROPAN-XL) 5 MG 24 hr tablet Take 5 mg by mouth daily. 12/12/21   [provider]  oxyCODONE-acetaminophen (PERCOCET) 10-325 MG tablet Take 1 tablet by mouth 5 (five) times daily. 08/27/20   [provider]  OXYCONTIN 15 MG 12 hr tablet Take 15 mg by mouth every 12 (twelve) hours. 08/28/20   [provider]  OXYGEN Inhale 6-7 L into the lungs See admin instructions. continuous    [provider]  Pancrelipase, Lip-Prot-Amyl, 24000-76000 units CPEP Take 2 capsules (48,000 Units total) by mouth See admin instructions. Take 2 caps daily with meals and 1 cap with snacks 02/14/22   Wynetta Emery,  Clanford L, MD  predniSONE (DELTASONE) 10 MG tablet TAKE 4 TABLETS BY MOUTH EVERY MORNING FOR 2 DAYS THEN 2 EVERY MORNING FOR 2 DAYS THEN TAKE 1 TABLET BY MOUTH EVERY MORNING FOR 2 DAYS THEN STOP 05/16/22   Tanda Rockers, MD  prochlorperazine (COMPAZINE) 25 MG suppository Place 1 suppository (25 mg total) rectally every 12 (twelve) hours as needed for nausea or vomiting. 08/07/21   Roxan Hockey, MD  revefenacin (YUPELRI) 175 MCG/3ML nebulizer solution Inhale 3 mLs (175 mcg total) into the lungs daily. 01/11/22   Tanda Rockers, MD  tiZANidine (ZANAFLEX) 4 MG capsule  Take 1 capsule (4 mg total) by mouth 3 (three) times daily as needed for muscle spasms. 09/13/21   Barton Dubois, MD  traZODone (DESYREL) 150 MG tablet Take 2 tablets (300 mg total) by mouth at bedtime. 02/14/22   Murlean Iba, MD      Allergies    Lyrica [pregabalin], Darvon [propoxyphene], Gabapentin, Augmentin [amoxicillin-pot clavulanate], Erythromycin, and Vibramycin [doxycycline calcium]    Review of Systems   Review of Systems  Gastrointestinal:  Positive for abdominal pain.    Physical Exam Updated Vital Signs BP 107/64   Pulse (!) 102   Temp 99.3 F (37.4 C)   Resp 14   SpO2 98%  Physical Exam  ED Results / Procedures / Treatments   Labs (all labs ordered are listed, but only abnormal results are displayed) Labs Reviewed  CBC WITH DIFFERENTIAL/PLATELET - Abnormal; Notable for the following components:      Result Value   WBC 11.7 (*)    RBC 3.37 (*)    Hemoglobin 11.2 (*)    MCV 115.4 (*)    MCHC 28.8 (*)    Neutro Abs 9.8 (*)    Lymphs Abs 0.5 (*)    Abs Immature Granulocytes 0.10 (*)    All other components within normal limits  COMPREHENSIVE METABOLIC PANEL - Abnormal; Notable for the following components:   Potassium <2.0 (*)    Chloride 112 (*)    CO2 19 (*)    Glucose, Bld 100 (*)    BUN <5 (*)    Creatinine, Ser 0.35 (*)    Calcium 5.7 (*)    Total Protein 4.1 (*)    Albumin 1.7 (*)    AST 11 (*)    All other components within normal limits  C DIFFICILE QUICK SCREEN W PCR REFLEX    GASTROINTESTINAL PANEL BY PCR, STOOL (REPLACES STOOL CULTURE)  GASTROINTESTINAL PANEL BY PCR, STOOL (REPLACES STOOL CULTURE)  LIPASE, BLOOD  BLOOD GAS, VENOUS    EKG EKG Interpretation  Date/Time:  Wednesday May 31 2022 17:09:49 EDT Ventricular Rate:  125 PR Interval:  143 QRS Duration: 74 QT Interval:  347 QTC Calculation: 501 R Axis:   51 Text Interpretation: Sinus tachycardia Multiple ventricular premature complexes LAE, consider biatrial  enlargement Nonspecific repol abnormality, diffuse leads Prolonged QT interval Confirmed by Milton Ferguson [phone]) on 05/31/2022 7:33:24 PM  Radiology CT ABDOMEN PELVIS W CONTRAST  Result Date: 05/31/2022 CLINICAL DATA:  Acute nonlocalized abdominal pain. Nausea, vomiting, and diarrhea for 6 weeks. Smoker. EXAM: CT ABDOMEN AND PELVIS WITH CONTRAST TECHNIQUE: Multidetector CT imaging of the abdomen and pelvis was performed using the standard protocol following bolus administration of intravenous contrast. RADIATION DOSE REDUCTION: This exam was performed according to the departmental dose-optimization program which includes automated exposure control, adjustment of the mA and/or kV according to patient size and/or use of iterative reconstruction technique. CONTRAST:  14mL OMNIPAQUE IOHEXOL 300 MG/ML  SOLN COMPARISON:  02/04/2022 FINDINGS: Lower chest: Dense consolidation and volume loss in the left lower lung. This is likely consolidative pneumonia but could indicate postobstructive change due to central obstructing lesion. Patchy infiltrates in the remainder of the lung bases. Hepatobiliary: Mild diffuse fatty infiltration of the liver. No focal lesions. Prominent intra and extrahepatic bile duct dilatation is similar to prior study. No obstructing mass or stone is identified. Gallbladder is normal. Pancreas: Diffuse pancreatic ductal dilatation with pancreatic parenchymal atrophy. Calcification in the head of the pancreas. Similar appearance to prior study. Changes are likely due to chronic pancreatitis. An occult obstructing lesion is not entirely excluded and consideration of follow-up elective MRI/MRCP should be considered if not previously performed. Spleen: Normal in size without focal abnormality. Adrenals/Urinary Tract: No adrenal gland nodules. Nephrograms are symmetrical. No hydronephrosis or hydroureter. Multiple bilateral subcentimeter renal cysts. Some of the cysts have increased density, likely  hemorrhagic. No change in appearance or size since prior study. No imaging follow-up is indicated. Bladder is normal. Stomach/Bowel: Stomach, small bowel, and colon are not abnormally distended. Decompression of the colon limits evaluation but there appears to be diffuse wall thickening involving the sigmoid and descending colon which could indicate infectious or inflammatory colitis. No extraluminal stranding. No loculated collections. Appendix is not identified. Vascular/Lymphatic: Extensive aortoiliac calcification. No aneurysm. No significant lymphadenopathy. Reproductive: Status post hysterectomy. No adnexal masses. Other: No free air or free fluid in the abdomen. Abdominal wall musculature appears intact. Generator pack in the left gluteal region with leads extending superiorly. Musculoskeletal: Degenerative changes in the spine. Postoperative fixation of the lower lumbosacral spine. IMPRESSION: 1. No evidence of bowel obstruction. Suggestion of wall thickening in the descending and rectosigmoid colon possibly indicating colitis. Consider infectious or inflammatory etiologies. 2. Stable appearance of intra and extrahepatic bile duct dilatation and pancreatic ductal dilatation. Pancreatic parenchymal atrophy and calcification in the head of the pancreas. Changes are likely due to chronic pancreatitis although an occult obstructing lesion is not entirely excluded. If not done previously, consider elective follow-up with MRCP. 3. Prominent aortic atherosclerosis. 4. Dense consolidation in the left lower lung with patchy infiltrates in both lower lungs. Changes probably represent pneumonia but a centrally obstructing lesion would not be excluded. Suggest completion CT of the chest for further evaluation. Electronically Signed   By: Lucienne Capers M.D.   On: 05/31/2022 19:54    Procedures Procedures  {Document cardiac monitor, telemetry assessment procedure when appropriate:1}  Medications Ordered in  ED Medications  potassium chloride 10 mEq in 100 mL IVPB (10 mEq Intravenous New Bag/Given 05/31/22 2015)  ciprofloxacin (CIPRO) IVPB 400 mg (400 mg Intravenous New Bag/Given 05/31/22 2038)  metroNIDAZOLE (FLAGYL) IVPB 500 mg (500 mg Intravenous New Bag/Given 05/31/22 2039)  ipratropium-albuterol (DUONEB) 0.5-2.5 (3) MG/3ML nebulizer solution 3 mL (has no administration in time range)  albuterol (PROVENTIL) (2.5 MG/3ML) 0.083% nebulizer solution 2.5 mg (has no administration in time range)  sodium chloride 0.9 % bolus 1,000 mL (0 mLs Intravenous Stopped 05/31/22 1913)  ondansetron (ZOFRAN) injection 4 mg (4 mg Intravenous Given 05/31/22 1744)  potassium chloride 10 mEq in 100 mL IVPB (0 mEq Intravenous Stopped 05/31/22 2025)  calcium gluconate 1 g/ 50 mL sodium chloride IVPB (0 mg Intravenous Stopped 05/31/22 2025)  iohexol (OMNIPAQUE) 300 MG/ML solution 100 mL (100 mLs Intravenous Contrast Given 05/31/22 1937)    ED Course/ Medical Decision Making/ A&P   {  CRITICAL CARE Performed by: Milton Ferguson Total  critical care time: 40 minutes Critical care time was exclusive of separately billable procedures and treating other patients. Critical care was necessary to treat or prevent imminent or life-threatening deterioration. Critical care was time spent personally by me on the following activities: development of treatment plan with patient and/or surrogate as well as nursing, discussions with consultants, evaluation of patient's response to treatment, examination of patient, obtaining history from patient or surrogate, ordering and performing treatments and interventions, ordering and review of laboratory studies, ordering and review of radiographic studies, pulse oximetry and re-evaluation of patient's condition.    Patient with colitis and hypokalemia.  She will be admitted to medicine and placed on antibiotics and getting potassium replacement Click here for ABCD2, HEART and other calculatorsREFRESH  Note before signing :1}                          Medical Decision Making Amount and/or Complexity of Data Reviewed Labs: ordered. Radiology: ordered. ECG/medicine tests: ordered.  Risk Prescription drug management. Decision regarding hospitalization.  Colitis and hypokalemia along with COPD exacerbation  {Document critical care time when appropriate:1} {Document review of labs and clinical decision tools ie heart score, Chads2Vasc2 etc:1}  {Document your independent review of radiology images, and any outside records:1} {Document your discussion with family members, caretakers, and with consultants:1} {Document social determinants of health affecting pt's care:1} {Document your decision making why or why not admission, treatments were needed:1} Final Clinical Impression(s) / ED Diagnoses Final diagnoses:  Colitis  Hypokalemia    Rx / DC Orders ED Discharge Orders     None

## 2022-05-31 NOTE — H&P (Signed)
TRH H&P   Patient Demographics:    Jasmine [name], is a 67 y.o. female  MRN: [id number]   DOB - April 17, 1955  Admit Date - 05/31/2022  Outpatient Primary MD for the patient is Janora Norlander, DO  Referring MD/NP/PA: Dr Roderic Palau  Outpatient Specialists: pulm dr Melvyn Novas, oncology Dr Delton Coombes  Patient coming from: home  No chief complaint on file.     HPI:    Jasmine [name]  is a 67 y.o. female,  with history of COPD, chronic respiratory failure on 6-7 L, tobacco abuse, opioid dependence secondary to chronic back pain, hypertension, hyperlipidemia,nephrolithiasis, colon polyps (TA's), GERD, idiopathic pancreatitis, abnormal pancreas imaging with dilated duct, chronically dilated bile duct. -Patient presents to ED secondary to complaints of abdominal pain, nausea, vomiting and diarrhea, she reports her diarrhea has been present for last few weeks, not improving, had 6 bowel movements today, as well she does report some dyspnea, she is on 6 L nasal cannula at baseline, reports sometimes she increases it up to 10, reports she is still smoking, she denies fever, chills, hematochezia, bright red blood per rectum. -In ED she is at baseline 6 L oxygen, labs significant for severe hypokalemia with potassium less than 2, hypocalcemia with calcium 5.7, Lumin low at 1.7, bilirubin and LFTs within normal limit, white blood cell count mildly elevated at 11.7, CT abdomen pelvis significant for wall thickening in the descending and rectosigmoid colon indicating colitis, and stable intra and extrahepatic bile duct dilation, measuring significant for left lower lobe consolidation with the bilateral bases patchy appearance suspicious for aspiration pneumonia, [facility] hospitalist consulted to admit.   Review of systems:      A full 10 point Review of Systems was done, except as stated above, all other Review  of Systems were negative.   With Past History of the following :    Past Medical History:  Diagnosis Date   Allergy    Anemia    Anxiety    Arthritis    Asthma    Carpal tunnel syndrome    Chronic back pain    Cigarette smoker [id number]   Complication of anesthesia    in the past has had N/V, the last few surgeries she has not been   COPD (chronic obstructive pulmonary disease) ([place])    Easy bruising 07/10/2016   GERD (gastroesophageal reflux disease)    Headache    Heart murmur 2005   never had any problems   History of blood transfusion 2018   History of kidney stones    Hypertension    Hypoglycemia    Hypoglycemia    Iron deficiency anemia 07/11/2016   Neuropathy    both arms   Normocytic anemia 07/29/2015   Pancreatitis    Pneumonia    PONV (postoperative nausea and vomiting)    Postoperative anemia due to acute blood loss 11/15/2015   R lung  cancer    Sciatica    Seizures ([place])    as a child when she would have an asthma attack. none as an adult   Shortness of breath dyspnea       Past Surgical History:  Procedure Laterality Date   ABDOMINAL HYSTERECTOMY  [id number]   APPLICATION OF ROBOTIC ASSISTANCE FOR SPINAL PROCEDURE N/A 09/05/2016   Procedure: APPLICATION OF ROBOTIC ASSISTANCE FOR SPINAL PROCEDURE;  Surgeon: Consuella Lose, MD;  Location: [facility];  Service: Neurosurgery;  Laterality: N/A;   BACK SURGERY     BIOPSY  06/14/2020   Procedure: BIOPSY;  Surgeon: Daneil Dolin, MD;  [facility];  Service: Endoscopy;;   [place]   COLONOSCOPY     unsure last   COLONOSCOPY WITH PROPOFOL N/A 06/14/2020   Procedure: COLONOSCOPY WITH PROPOFOL;  Surgeon: Daneil Dolin, MD;  [facility];  Service: Endoscopy;  Laterality: N/A;  PM   cyst removal face     disk repair     neck and lumbar region   ESOPHAGOGASTRODUODENOSCOPY (EGD) WITH PROPOFOL N/A 06/14/2020   Procedure: ESOPHAGOGASTRODUODENOSCOPY (EGD) WITH  PROPOFOL;  Surgeon: Daneil Dolin, MD;  [facility];  Service: Endoscopy;  Laterality: N/A;   FOOT SURGERY Bilateral    to file bones down    HARDWARE REMOVAL N/A 09/05/2016   Procedure: HARDWARE REMOVAL AND REPLACEMENT OF LUMBAR FIVE SCREWS;  Surgeon: Consuella Lose, MD;  Location: [facility];  Service: Neurosurgery;  Laterality: N/A;   IMPLANTATION / PLACEMENT EPIDURAL NEUROSTIMULATOR ELECTRODES     INTERCOSTAL NERVE BLOCK Right 07/29/2020   Procedure: INTERCOSTAL NERVE BLOCK;  Surgeon: Melrose Nakayama, MD;  Location: [facility];  Service: Thoracic;  Laterality: Right;   LAMINECTOMY     2006   lobectomy Right 07/2020   LUMBAR FUSION     LYMPH NODE BIOPSY Right 07/29/2020   Procedure: LYMPH NODE BIOPSY;  Surgeon: Melrose Nakayama, MD;  Location: [facility];  Service: Thoracic;  Laterality: Right;   POLYPECTOMY  06/14/2020   Procedure: POLYPECTOMY INTESTINAL;  Surgeon: Daneil Dolin, MD;  [facility];  Service: Endoscopy;;   SPINAL CORD STIMULATOR BATTERY EXCHANGE     SPINAL CORD STIMULATOR INSERTION N/A 07/05/2016   Procedure: REPLACEMENT OF LUMBAR SPINAL CORD STIMULATOR BATTERY;  Surgeon: Newman Pies, MD;  Location: [facility];  Service: Neurosurgery;  Laterality: N/A;   TONSILLECTOMY        Social History:     Social History   Tobacco Use   Smoking status: Every Day    Packs/day: 0.25    Years: 35.00    Additional pack years: 0.00    Total pack years: 8.75    Types: Cigarettes   Smokeless tobacco: Never   Tobacco comments:    trying to quit  Substance Use Topics   Alcohol use: Yes    Alcohol/week: 2.0 standard drinks of alcohol    Types: 2 Glasses of wine per week    Comment: occasionally       Family History :     Family History  Problem Relation Age of Onset   Heart disease Mother    Diabetes Mother    Hypertension Mother    Hyperlipidemia Mother    COPD Mother        smoked   Cancer Mother        bile duct   COPD Father  Diabetes Father    Heart disease Father    Cancer Father        throat   Dementia Father    Hypertension Sister    Hyperlipidemia Sister    Hypertension Brother    Hyperlipidemia Brother    Hypertension Brother    Hyperlipidemia Brother    Emphysema Maternal Grandmother        smoked   Asthma Son    Colon cancer Neg Hx    Colon polyps Neg Hx    Pancreatic cancer Neg Hx    Esophageal cancer Neg Hx    Inflammatory bowel disease Neg Hx    Liver disease Neg Hx    Rectal cancer Neg Hx    Stomach cancer Neg Hx       Home Medications:   Prior to Admission medications   Medication Sig Start Date End Date Taking? Authorizing Provider  acetaminophen (TYLENOL) 500 MG tablet Take 1,000 mg by mouth every 6 (six) hours as needed for headache.   Yes [provider]  albuterol (VENTOLIN HFA) 108 (90 Base) MCG/ACT inhaler 2puffs up to every 4 hours if can't catch your breath 01/11/22  Yes Tanda Rockers, MD  amLODipine (NORVASC) 10 MG tablet Take 1 tablet (10 mg total) by mouth daily. 09/26/21  Yes Ronnie Doss M, DO  mirtazapine (REMERON) 15 MG tablet Take by mouth. 09/26/21  Yes [provider]  nystatin (MYCOSTATIN) 100000 UNIT/ML suspension Take by mouth. 10/27/21  Yes [provider]  bisoprolol (ZEBETA) 5 MG tablet Take 0.5 tablets (2.5 mg total) by mouth daily. Take in Place of Metoprolol Patient not taking: Reported on 05/31/2022 08/07/21   Roxan Hockey, MD  BREZTRI AEROSPHERE 160-9-4.8 MCG/ACT AERO Inhale 2 puffs into the lungs 2 (two) times daily. Patient not taking: Reported on 05/31/2022    [provider]  budesonide (PULMICORT) 0.25 MG/2ML nebulizer solution Take 2 mLs (0.25 mg total) by nebulization in the morning and at bedtime. 01/11/22   Tanda Rockers, MD  dextromethorphan (DELSYM) 30 MG/5ML liquid Take 15 mg by mouth as needed for cough.    [provider]  diclofenac Sodium (VOLTAREN) 1 % GEL Apply 2 g topically 4 (four) times  daily. 09/05/21   [provider]  formoterol (PERFOROMIST) 20 MCG/2ML nebulizer solution Take 2 mLs (20 mcg total) by nebulization 2 (two) times daily. Use in nebulizer twice daily perfectly regularly 01/11/22   Tanda Rockers, MD  Guaifenesin [facility] MAXIMUM STRENGTH) 1200 MG TB12 Take 1 tablet by mouth 2 (two) times daily.    [provider]  magic mouthwash SOLN Take 5 mLs by mouth 3 (three) times daily as needed for mouth pain. 10/26/21   Janora Norlander, DO  metoCLOPramide (REGLAN) 5 MG tablet Take 1 tablet (5 mg total) by mouth every 6 (six) hours as needed for nausea. 02/14/22 03/16/22  Murlean Iba, MD  Nutritional Supplements (ENSURE HIGH PROTEIN) LIQD Drink 1 can 3 times daily. 08/30/20   Janora Norlander, DO  omeprazole (PRILOSEC) 40 MG capsule TAKE 1 CAPSULE BY MOUTH 1 TO 2 TIMES DAILY BEFORE A MEAL 04/17/22   Venetia Night L, NP  oxybutynin (DITROPAN-XL) 5 MG 24 hr tablet Take 5 mg by mouth daily. 12/12/21   [provider]  oxyCODONE-acetaminophen (PERCOCET) 10-325 MG tablet Take 1 tablet by mouth 5 (five) times daily. 08/27/20   [provider]  OXYCONTIN 15 MG 12 hr tablet Take 15 mg by mouth every  12 (twelve) hours. 08/28/20   [provider]  OXYGEN Inhale 6-7 L into the lungs See admin instructions. continuous    [provider]  Pancrelipase, Lip-Prot-Amyl, 24000-76000 units CPEP Take 2 capsules (48,000 Units total) by mouth See admin instructions. Take 2 caps daily with meals and 1 cap with snacks 02/14/22   Johnson, Clanford L, MD  predniSONE (DELTASONE) 10 MG tablet TAKE 4 TABLETS BY MOUTH EVERY MORNING FOR 2 DAYS THEN 2 EVERY MORNING FOR 2 DAYS THEN TAKE 1 TABLET BY MOUTH EVERY MORNING FOR 2 DAYS THEN STOP 05/16/22   Tanda Rockers, MD  prochlorperazine (COMPAZINE) 25 MG suppository Place 1 suppository (25 mg total) rectally every 12 (twelve) hours as needed for nausea or vomiting. 08/07/21   Roxan Hockey, MD   revefenacin (YUPELRI) 175 MCG/3ML nebulizer solution Inhale 3 mLs (175 mcg total) into the lungs daily. 01/11/22   Tanda Rockers, MD  tiZANidine (ZANAFLEX) 4 MG capsule Take 1 capsule (4 mg total) by mouth 3 (three) times daily as needed for muscle spasms. 09/13/21   Barton Dubois, MD  traZODone (DESYREL) 150 MG tablet Take 2 tablets (300 mg total) by mouth at bedtime. 02/14/22   Murlean Iba, MD     Allergies:     Allergies  Allergen Reactions   Lyrica [Pregabalin] Other (See Comments)    EXTREME SHAKING/TREMBLING   Darvon [Propoxyphene]     UNSPECIFIED REACTION    Gabapentin     Extreme shaking and trembling    Augmentin [Amoxicillin-Pot Clavulanate] Nausea And Vomiting    Has patient had a PCN reaction causing immediate rash, facial/tongue/throat swelling, SOB or lightheadedness with hypotension:no Has patient had a PCN reaction causing severe rash involving mucus membranes or skin necrosis:No Has patient had a PCN reaction that required hospitalization:No Has patient had a PCN reaction occurring within the last 10 years:Yes--ONLY N/V If all of the above answers are "NO", then may proceed with Cephalosporin use.    Erythromycin Itching and Rash        Vibramycin [Doxycycline Calcium] Itching and Rash    "[place] "     Physical Exam:   Vitals  Blood pressure 107/64, pulse (!) 102, temperature 99.3 F (37.4 C), resp. rate 14, SpO2 (!) 88 %.   1. General frail, chronic ill-appearing female, appears much older than stated age, sitting in bed in no apparent distress  2. Normal affect and insight, Not Suicidal or Homicidal, Awake Alert, Oriented X 3.  3. No F.N deficits, ALL C.Nerves Intact, Strength 5/5 all 4 extremities, Sensation intact all 4 extremities, Plantars down going.  4. Ears and Eyes appear Normal, Conjunctivae clear, PERRLA. Moist Oral Mucosa.  5. Supple Neck, No JVD, No cervical lymphadenopathy appriciated, No Carotid Bruits.  6. Symmetrical  Chest wall movement, air entry bilaterally with scattered wheezing  7. tachycardic, No Gallops, Rubs or Murmurs, No Parasternal Heave.  8. Positive Bowel Sounds, Abdomen Soft, mild tenderness in lower abdomen, No organomegaly appriciated,No rebound -guarding or rigidity.  9.  No Cyanosis, Normal Skin Turgor, No Skin Rash or Bruise.  10. Good muscle tone,  joints appear normal , no effusions, Normal ROM.     Data Review:    CBC Recent Labs  Lab 05/31/22 1721  WBC 11.7*  HGB 11.2*  HCT 38.9  PLT 269  MCV 115.4*  MCH 33.2  MCHC 28.8*  RDW 14.0  LYMPHSABS 0.5*  MONOABS 0.9  EOSABS 0.4  BASOSABS 0.0   ------------------------------------------------------------------------------------------------------------------  Chemistries  Recent Labs  Lab 05/31/22 1721  NA 140  K <2.0*  CL 112*  CO2 19*  GLUCOSE 100*  BUN <5*  CREATININE 0.35*  CALCIUM 5.7*  AST 11*  ALT 10  ALKPHOS 92  BILITOT 0.9   ------------------------------------------------------------------------------------------------------------------ CrCl cannot be calculated (Unknown ideal weight.). ------------------------------------------------------------------------------------------------------------------ No results for input(s): "TSH", "T4TOTAL", "T3FREE", "THYROIDAB" in the last 72 hours.  Invalid input(s): "FREET3"  Coagulation profile No results for input(s): "INR", "PROTIME" in the last 168 hours. ------------------------------------------------------------------------------------------------------------------- No results for input(s): "DDIMER" in the last 72 hours. -------------------------------------------------------------------------------------------------------------------  Cardiac Enzymes No results for input(s): "CKMB", "TROPONINI", "MYOGLOBIN" in the last 168 hours.  Invalid input(s):  "CK" ------------------------------------------------------------------------------------------------------------------    Component Value Date/Time   BNP 50.0 09/26/2021 1900     ---------------------------------------------------------------------------------------------------------------  Urinalysis    Component Value Date/Time   COLORURINE YELLOW 02/04/2022 2141   APPEARANCEUR CLEAR 02/04/2022 2141   APPEARANCEUR Clear 06/07/2021 1524   LABSPEC 1.014 02/04/2022 2141   PHURINE 6.0 02/04/2022 2141   GLUCOSEU NEGATIVE 02/04/2022 2141   HGBUR NEGATIVE 02/04/2022 2141   BILIRUBINUR NEGATIVE 02/04/2022 2141   BILIRUBINUR Negative 06/07/2021 1524   KETONESUR 20 (A) 02/04/2022 2141   PROTEINUR 100 (A) 02/04/2022 2141   NITRITE NEGATIVE 02/04/2022 2141   LEUKOCYTESUR NEGATIVE 02/04/2022 2141    ----------------------------------------------------------------------------------------------------------------   Imaging Results:    CT ABDOMEN PELVIS W CONTRAST  Result Date: 05/31/2022 CLINICAL DATA:  Acute nonlocalized abdominal pain. Nausea, vomiting, and diarrhea for 6 weeks. Smoker. EXAM: CT ABDOMEN AND PELVIS WITH CONTRAST TECHNIQUE: Multidetector CT imaging of the abdomen and pelvis was performed using the standard protocol following bolus administration of intravenous contrast. RADIATION DOSE REDUCTION: This exam was performed according to the departmental dose-optimization program which includes automated exposure control, adjustment of the mA and/or kV according to patient size and/or use of iterative reconstruction technique. CONTRAST:  135mL OMNIPAQUE IOHEXOL 300 MG/ML  SOLN COMPARISON:  02/04/2022 FINDINGS: Lower chest: Dense consolidation and volume loss in the left lower lung. This is likely consolidative pneumonia but could indicate postobstructive change due to central obstructing lesion. Patchy infiltrates in the remainder of the lung bases. Hepatobiliary: Mild diffuse fatty  infiltration of the liver. No focal lesions. Prominent intra and extrahepatic bile duct dilatation is similar to prior study. No obstructing mass or stone is identified. Gallbladder is normal. Pancreas: Diffuse pancreatic ductal dilatation with pancreatic parenchymal atrophy. Calcification in the head of the pancreas. Similar appearance to prior study. Changes are likely due to chronic pancreatitis. An occult obstructing lesion is not entirely excluded and consideration of follow-up elective MRI/MRCP should be considered if not previously performed. Spleen: Normal in size without focal abnormality. Adrenals/Urinary Tract: No adrenal gland nodules. Nephrograms are symmetrical. No hydronephrosis or hydroureter. Multiple bilateral subcentimeter renal cysts. Some of the cysts have increased density, likely hemorrhagic. No change in appearance or size since prior study. No imaging follow-up is indicated. Bladder is normal. Stomach/Bowel: Stomach, small bowel, and colon are not abnormally distended. Decompression of the colon limits evaluation but there appears to be diffuse wall thickening involving the sigmoid and descending colon which could indicate infectious or inflammatory colitis. No extraluminal stranding. No loculated collections. Appendix is not identified. Vascular/Lymphatic: Extensive aortoiliac calcification. No aneurysm. No significant lymphadenopathy. Reproductive: Status post hysterectomy. No adnexal masses. Other: No free air or free fluid in the abdomen. Abdominal wall musculature appears intact. Generator pack in the left gluteal region with leads extending superiorly. Musculoskeletal: Degenerative changes in the spine. Postoperative fixation of  the lower lumbosacral spine. IMPRESSION: 1. No evidence of bowel obstruction. Suggestion of wall thickening in the descending and rectosigmoid colon possibly indicating colitis. Consider infectious or inflammatory etiologies. 2. Stable appearance of intra and  extrahepatic bile duct dilatation and pancreatic ductal dilatation. Pancreatic parenchymal atrophy and calcification in the head of the pancreas. Changes are likely due to chronic pancreatitis although an occult obstructing lesion is not entirely excluded. If not done previously, consider elective follow-up with MRCP. 3. Prominent aortic atherosclerosis. 4. Dense consolidation in the left lower lung with patchy infiltrates in both lower lungs. Changes probably represent pneumonia but a centrally obstructing lesion would not be excluded. Suggest completion CT of the chest for further evaluation. Electronically Signed   By: Lucienne Capers M.D.   On: 05/31/2022 19:54    EKG:   rate 125 BPM PR interval 143 ms QRS duration 74 ms QT/QTcB 347/501 ms P-R-T axes 79 51 237 Sinus tachycardia Multiple ventricular premature complexes LAE, consider biatrial enlargement Nonspecific repol abnormality, diffuse leads Prolonged QT interval    Principal Problem:   Infectious colitis Active Problems:   Chronic narcotic use   Centrilobular emphysema (HCC)   Cigarette smoker   Pulmonary infiltrate on chest x-ray   Squamous cell lung cancer, right (HCC)   Chronic pain syndrome   Chronic pancreatitis (HCC)   COPD exacerbation ([facility])   Infectious colitis -Patient with abdominal pain, nausea and vomiting, profuse diarrhea, imaging significant for infectious colitis -Check C. 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 Delton Coombes   Tobacco abuse -Counseled, continue with nicotine patch   Essential hypertension -Blood pressure controlled, resume amlodipine and Zebeta once medication has been completed , continue with as needed hydralazine as well  Hyperlipidemia -Continue with statin    DVT Prophylaxis Heparin   AM Labs Ordered, also please review Full Orders  Family Communication: Admission, patients condition and plan of care including tests being ordered have been discussed with the patient  who indicate understanding and agree with the plan and Code Status.  Code Status Full  Likely DC to  home  Condition GUARDED    Consults called: none    Admission status: inpatient    Time spent in minutes : 70 minutes   Phillips Climes M.D on 05/31/2022 at 9:00 PM   [facility] - Office  [phone]

## 2022-06-01 ENCOUNTER — Inpatient Hospital Stay (HOSPITAL_COMMUNITY): Payer: Medicare Other

## 2022-06-01 DIAGNOSIS — E876 Hypokalemia: Secondary | ICD-10-CM

## 2022-06-01 DIAGNOSIS — K529 Noninfective gastroenteritis and colitis, unspecified: Principal | ICD-10-CM | POA: Diagnosis present

## 2022-06-01 DIAGNOSIS — J189 Pneumonia, unspecified organism: Secondary | ICD-10-CM

## 2022-06-01 DIAGNOSIS — K861 Other chronic pancreatitis: Secondary | ICD-10-CM | POA: Diagnosis not present

## 2022-06-01 DIAGNOSIS — I10 Essential (primary) hypertension: Secondary | ICD-10-CM | POA: Insufficient documentation

## 2022-06-01 DIAGNOSIS — F17210 Nicotine dependence, cigarettes, uncomplicated: Secondary | ICD-10-CM

## 2022-06-01 LAB — CBC
HCT: 31.2 % — ABNORMAL LOW (ref 36.0–46.0)
Hemoglobin: 9.4 g/dL — ABNORMAL LOW (ref 12.0–15.0)
MCH: 33.5 pg (ref 26.0–34.0)
MCHC: 30.1 g/dL (ref 30.0–36.0)
MCV: 111 fL — ABNORMAL HIGH (ref 80.0–100.0)
Platelets: 232 10*3/uL (ref 150–400)
RBC: 2.81 MIL/uL — ABNORMAL LOW (ref 3.87–5.11)
RDW: 13.9 % (ref 11.5–15.5)
WBC: 7.6 10*3/uL (ref 4.0–10.5)
nRBC: 0 % (ref 0.0–0.2)

## 2022-06-01 LAB — BASIC METABOLIC PANEL
Anion gap: 8 (ref 5–15)
BUN: <5 mg/dL — ABNORMAL LOW (ref 8–23)
CO2: 24 mmol/L (ref 22–32)
Calcium: 7.8 mg/dL — ABNORMAL LOW (ref 8.9–10.3)
Chloride: 104 mmol/L (ref 98–111)
Creatinine, Ser: 0.49 mg/dL (ref 0.44–1.00)
GFR, Estimated: >60 mL/min (ref >60)
Glucose, Bld: 144 mg/dL — ABNORMAL HIGH (ref 70–99)
Potassium: 5 mmol/L (ref 3.5–5.1)
Sodium: 136 mmol/L (ref 135–145)

## 2022-06-01 LAB — MAGNESIUM: Magnesium: 1.9 mg/dL (ref 1.7–2.4)

## 2022-06-01 MED ORDER — PANCRELIPASE (LIP-PROT-AMYL) 12000-38000 UNITS PO CPEP
48000.0000 [IU] | ORAL_CAPSULE | Freq: Three times a day (TID) | ORAL | Status: DC
  Administered 2022-06-01: 48000 [IU] via ORAL
  Filled 2022-06-01 (×3): qty 4

## 2022-06-01 MED ORDER — TIZANIDINE HCL 4 MG PO TABS
4.0000 mg | ORAL_TABLET | Freq: Three times a day (TID) | ORAL | Status: DC | PRN
  Administered 2022-06-01 – 2022-06-05 (×8): 4 mg via ORAL
  Filled 2022-06-01 (×8): qty 1

## 2022-06-01 MED ORDER — OXYCODONE HCL 5 MG PO TABS
5.0000 mg | ORAL_TABLET | ORAL | Status: DC | PRN
  Administered 2022-06-01: 5 mg via ORAL
  Filled 2022-06-01 (×2): qty 1

## 2022-06-01 MED ORDER — IPRATROPIUM-ALBUTEROL 0.5-2.5 (3) MG/3ML IN SOLN
3.0000 mL | Freq: Four times a day (QID) | RESPIRATORY_TRACT | Status: DC
  Administered 2022-06-01 (×2): 3 mL via RESPIRATORY_TRACT
  Filled 2022-06-01 (×2): qty 3

## 2022-06-01 MED ORDER — IPRATROPIUM-ALBUTEROL 0.5-2.5 (3) MG/3ML IN SOLN: 3.0000 mL | RESPIRATORY_TRACT | Status: DC | PRN

## 2022-06-01 MED ORDER — TRAZODONE HCL 50 MG PO TABS
300.0000 mg | ORAL_TABLET | Freq: Every day | ORAL | Status: DC
  Administered 2022-06-01 – 2022-06-05 (×5): 300 mg via ORAL
  Filled 2022-06-01 (×5): qty 6

## 2022-06-01 MED ORDER — MIRTAZAPINE 15 MG PO TABS
15.0000 mg | ORAL_TABLET | Freq: Every day | ORAL | Status: DC
  Administered 2022-06-01 – 2022-06-05 (×5): 15 mg via ORAL
  Filled 2022-06-01 (×5): qty 1

## 2022-06-01 MED ORDER — PANCRELIPASE (LIP-PROT-AMYL) 12000-38000 UNITS PO CPEP
48000.0000 [IU] | ORAL_CAPSULE | Freq: Three times a day (TID) | ORAL | Status: DC
  Filled 2022-06-01: qty 4

## 2022-06-01 MED ORDER — MORPHINE SULFATE (PF) 2 MG/ML IV SOLN
2.0000 mg | INTRAVENOUS | Status: DC | PRN
  Administered 2022-06-01 (×2): 2 mg via INTRAVENOUS
  Filled 2022-06-01 (×2): qty 1

## 2022-06-01 MED ORDER — PREDNISONE 20 MG PO TABS
40.0000 mg | ORAL_TABLET | Freq: Every day | ORAL | Status: DC
  Administered 2022-06-02 – 2022-06-06 (×5): 40 mg via ORAL
  Filled 2022-06-01 (×5): qty 2

## 2022-06-01 MED ORDER — IPRATROPIUM-ALBUTEROL 0.5-2.5 (3) MG/3ML IN SOLN
3.0000 mL | Freq: Three times a day (TID) | RESPIRATORY_TRACT | Status: DC
  Administered 2022-06-02 – 2022-06-05 (×8): 3 mL via RESPIRATORY_TRACT
  Filled 2022-06-01 (×10): qty 3

## 2022-06-01 MED ORDER — SODIUM CHLORIDE 0.9 % IV SOLN
1.0000 g | INTRAVENOUS | Status: DC
  Administered 2022-06-01 – 2022-06-03 (×3): 1 g via INTRAVENOUS
  Filled 2022-06-01 (×3): qty 10

## 2022-06-01 MED ORDER — ENOXAPARIN SODIUM 40 MG/0.4ML IJ SOSY
40.0000 mg | PREFILLED_SYRINGE | INTRAMUSCULAR | Status: DC
  Administered 2022-06-01: 40 mg via SUBCUTANEOUS
  Filled 2022-06-01: qty 0.4

## 2022-06-01 MED ORDER — GUAIFENESIN-DM 100-10 MG/5ML PO SYRP: 5.0000 mL | ORAL_SOLUTION | ORAL | Status: DC | PRN

## 2022-06-01 MED ORDER — OXYBUTYNIN CHLORIDE ER 5 MG PO TB24
5.0000 mg | ORAL_TABLET | Freq: Every day | ORAL | Status: DC
  Administered 2022-06-01 – 2022-06-06 (×6): 5 mg via ORAL
  Filled 2022-06-01 (×7): qty 1

## 2022-06-01 MED ORDER — MORPHINE SULFATE (PF) 4 MG/ML IV SOLN
3.0000 mg | INTRAVENOUS | Status: DC | PRN
  Administered 2022-06-01 – 2022-06-06 (×47): 3 mg via INTRAVENOUS
  Filled 2022-06-01 (×47): qty 1

## 2022-06-01 NOTE — Hospital Course (Addendum)
Jasmine [name] was admitted to the hospital with the working diagnosis of colitis complicated with hypokalemia.   67 yo female with the past medical history of COPD, opioid dependence, hypertension, dyslipidemia and chronic pancreatitis who presented with abdominal pain, nausea, vomiting and diarrhea for last few weeks. About 6 bowel movements per day. Prior to her abdominal symptoms patient had 3 weeks of cough, congestion and dyspnea. Progressive symptoms with productive cough.  On her initial physical examination her blood pressure was 107/64, HR 102, RR 14 and 02 saturation 88%, lungs with scattered wheezing with no rhonchi, heart with S1 and S2 present and rhythmic, tachycardic, abdomen soft with mild tenderness at the lower abdomen, no rebound or guarding, no lower extremity edema.   Na 140, K <2,0 Cl 112, bicarbonate 19, glucose 100, BUN <5, cr 0,35. Ca 5,7  Lipase 18 AST 11, ALT 10, T Bil 0,9 Wbc 11.7 hgb 11.2 plt 269   CT abdomen and pelvis with no evidence of obstruction, Positive wall thickening in the descending and rectosigmoid colon possibly indicating colitis.  Stable intra and extrahepatic duct dilatation and pancreatic duct dilatation. Pancreatic atrophy and calcification in the head of the pancreas.  Dense consolidations in the left lower lung and patchy infiltrates lower lobes bilaterally.   EKG 125 bpm, normal axis, normal intervals, sinus rhythm with PAC, no significant ST segment or  T wave changes.   03/29 patient with improvement in diarrhea, chest pain also improving, continue to have dyspnea and cough.  03/30 patient is feeling better, but having persistent diarrhea.

## 2022-06-01 NOTE — Assessment & Plan Note (Signed)
Continue pain control. Out of bed to chair, Pt and Ot.

## 2022-06-01 NOTE — Progress Notes (Signed)
Progress Note   Patient: Jasmine [name] [id number] DOB: 1955-10-12 DOA: 05/31/2022     1 DOS: the patient was seen and examined on 06/01/2022   Brief hospital course: Jasmine [name] was admitted to the hospital with the working diagnosis of colitis complicated with hypokalemia.   66 yo female with the past medical history of COPD, opioid dependence, hypertension, dyslipidemia and chronic pancreatitis who presented with abdominal pain, nausea, vomiting and diarrhea for last few weeks. About 6 bowel movements per day. Prior to her abdominal symptoms patient had 3 weeks of cough, congestion and dyspnea. Progressive symptoms with productive cough.  On her initial physical examination her blood pressure was 107/64, HR 102, RR 14 and 02 saturation 88%, lungs with scattered wheezing with no rhonchi, heart with S1 and S2 present and rhythmic, tachycardic, abdomen soft with mild tenderness at the lower abdomen, no rebound or guarding, no lower extremity edema.   Na 140, K <2,0 Cl 112, bicarbonate 19, glucose 100, BUN <5, cr 0,35. Ca 5,7  Lipase 18 AST 11, ALT 10, T Bil 0,9 Wbc 11.7 hgb 11.2 plt 269   CT abdomen and pelvis with no evidence of obstruction, Positive wall thickening in the descending and rectosigmoid colon possibly indicating colitis.  Stable intra and extrahepatic duct dilatation and pancreatic duct dilatation. Pancreatic atrophy and calcification in the head of the pancreas.  Dense consolidations in the left lower lung and patchy infiltrates lower lobes bilaterally.   EKG 125 bpm, normal axis, normal intervals, sinus rhythm with PAC, no significant ST segment or  T wave changes.     Assessment and Plan: Left lower lobe pneumonia COPD exacerbation.  Dense infiltrate at the left lower lobe. It seems that her respiratory symptoms preceded GI symptoms.  Plan to check chest radiograph. Continue antibiotic therapy, change to ceftriaxone and azithromycin. Bronchodilator therapy, and  antitussive agents Airway clearing techniques. Systemic and inhaled corticosteroids. Continue pain control, (pleuritic left lower zone).  Continue oxymetry monitoring, supplemental 0-2 to keep 02 saturation 88% or greater.  Out of bed to chair tid with meals.  PT and OT.  Colitis CT with positive signs of colitis,   Patient with improvement of her diarrhea.  I think pneumonia triggered GI symptoms,.  Will continue antibiotic therapy with ceftriaxone.  Continue antiacid therapy. Doubt C diff.   Hypokalemia Low K due to GI losses.  Follow up K is 5.0 with preserved renal function. Will continue IV fluids with balanced electrolyte solution for volume resuscitation.  Follow up electrolytes and renal function in am.   Cigarette smoker Smoking cessation counseling.  Chronic pancreatitis ([place]) Continue pain control and supportive care. Chronic changes on abdominal CT in regards to her pancreas.  Continue antiacid therapy.  Consult nutrition.   Squamous cell lung cancer, right ([place]) Follow up as outpatient. Hx of lobectomy on the right upper lobe   Chronic pain syndrome Continue pain control. Out of bed to chair, Pt and Ot.   Essential hypertension Continue blood pressure control with amlodipine.         Subjective: Patient continue to have cough and dyspnea, abdominal pain left upper quadrant, no further diarrhea, nausea or vomiting.   Physical Exam: Vitals:   06/01/22 0700 06/01/22 0816 06/01/22 1030 06/01/22 1101  BP: 119/73  117/78 109/79  Pulse: 81  100 (!) 101  Resp: 15  16 17   Temp:    98.7 F (37.1 C)  TempSrc:    Oral  SpO2: 100% 100% 99% 96%  Neurology awake and alert ENT with pallor Cardiovascular with S1 and S2 present and rhythmic with no gallops, rubs or murmurs No JVD No lower extremity edema Respiratory with rales at the left lower lobe, with no wheezing, positive prolonged expiratory phase and scattered rhonchi Abdomen with no distention,  not tender to superficial palpation, no rebound or guarding No lower extremity edema  Data Reviewed:    Family Communication: no family at the bedside   Disposition: Status is: Inpatient Remains inpatient appropriate because: IV antibiotics and electrolyte monitoring   Planned Discharge Destination: Home      Author: Tawni Millers, MD 06/01/2022 11:29 AM  For on call review [email or web address].

## 2022-06-01 NOTE — Assessment & Plan Note (Signed)
Smoking cessation counseling.  °

## 2022-06-01 NOTE — Assessment & Plan Note (Signed)
Follow up as outpatient. Hx of lobectomy on the right upper lobe

## 2022-06-01 NOTE — TOC Initial Note (Signed)
Transition of Care [facility]) - Initial/Assessment Note    Patient Details  Name: Jasmine [name] MRN: [id number] Date of Birth: May 12, 1955  [facility] of [facility]) CM/SW Contact:    Jasmine Lucks, RN Phone Number: 06/01/2022, 2:32 PM  Clinical Narrative:    Patient admitted with infectious colitis. TOC spoke with her spouse. Patient has a high risk for readmission. Uses a walker. Has a cane, wc, bsc, shower stool, all bathrooms in her home have handicap guardrails. On home oxygen, 6L, provided by [facility]. Husband provides transportation. Husband is interested in Home health PT for strength exercises. MD aware to order. No preferences. Jasmine [name] with [facility] accepted the referral.    Expected Discharge Plan: [place] Barriers to Discharge: Continued Medical Work up   Patient Goals and CMS Choice Patient states their goals for this hospitalization and ongoing recovery are:: to go home. CMS Medicare.gov Compare Post Acute Care list provided to:: [name] (must comment) Choice offered to / list presented to : Spouse      Expected Discharge Plan and Services      Living arrangements for the past 2 months: Single Family Home                   DME Agency: [employer]    HH Arranged: PT HH Agency: [facility] ([place]) Date HH Agency Contacted: 06/01/22 Time [place]: 1 Representative spoke with at [place]: Jasmine [name]  Prior Living Arrangements/Services Living arrangements for the past 2 months: [place] with:: Spouse Patient language and need for interpreter reviewed:: Yes        Need for Family Participation in Patient Care: Yes (Comment) Care giver support system in place?: Yes (comment)   Criminal Activity/Legal Involvement Pertinent to Current Situation/Hospitalization: No - Comment as needed  Activities of Daily Living   Permission Sought/Granted         Permission granted to share info w  Relationship: Spouse     Emotional Assessment   Admission diagnosis:  Hypokalemia [E87.6] Infectious colitis [A09] Colitis [K52.9] Patient Active Problem List   Diagnosis Date Noted   Colitis 06/01/2022   Left lower lobe pneumonia 06/01/2022   Essential hypertension 06/01/2022   Infectious colitis 05/31/2022   Abdominal pain, epigastric 02/07/2022   Nausea and vomiting 02/07/2022   Opioid dependence ([place]) 02/05/2022   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   Dilation of pancreatic duct 01/06/2022   Common bile duct dilation 01/06/2022   COPD exacerbation ([place]) 09/26/2021   Sepsis ([place]) 09/23/2021   Chronic pancreatitis ([place]) 09/09/2021   Hyperlipidemia 09/09/2021   Community acquired pneumonia 08/04/2021   PNA (pneumonia) 08/03/2021   Abdominal pain 06/29/2021   Chronic pain syndrome 03/13/2021   Pressure injury of skin 03/12/2021   Acute on chronic pancreatitis ([place]) 03/12/2021   Intractable abdominal pain 03/11/2021   Intractable pain 02/12/2021   Squamous cell lung cancer, right ([place]) 01/13/2021   Atypical chest pain 11/26/2020   Elevated MCV 11/26/2020   Intractable nausea and vomiting 11/26/2020   History of Clostridioides difficile infection 11/26/2020   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[place]) 10/11/2020   Gastroenteritis 10/11/2020   Acute gastroenteritis 10/10/2020   Protein-calorie malnutrition, severe 08/26/2020   Chest pain 08/25/2020   Pleuritic chest pain 08/25/2020   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   GERD (gastroesophageal reflux disease) 04/22/2020   Abnormal CT of the abdomen 04/22/2020   Preventative health care 04/22/2020   Solitary pulmonary nodule 03/22/2020   Pulmonary infiltrate  on chest x-ray 02/24/2020   Nocturnal cough 02/19/2020   Pancreatitis, acute 01/06/2019   Acute pancreatitis 01/05/2019   Cigarette smoker 08/25/2017   Chronic respiratory failure with hypoxia ([place]) 08/25/2017   Centrilobular emphysema ([place]) 07/09/2017   Aortic  atherosclerosis ([place]) 07/09/2017   Hepatic steatosis 07/09/2017   Malnutrition of moderate degree 07/02/2017   Acute respiratory failure with hypoxia ([place]) 06/30/2017   Hypokalemia 06/30/2017   COPD with acute exacerbation ([place]) 06/30/2017   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/2016   Tobacco abuse 08/04/2016   Iron deficiency anemia 07/11/2016   Easy bruising 07/10/2016   Lumbar stenosis 11/11/2015   Leg pain 09/24/2015   Vitamin D deficiency 07/29/2015   Chronic back pain 02/22/2015   HTN (hypertension) 02/22/2015   Chronic narcotic use 02/22/2015   COPD GOLD II  02/22/2015   Headache 02/22/2015   PCP:  Jasmine Norlander, DO Pharmacy:   [facility] [place], [place] [address] [place] [place] [address] Phone: [phone] Fax: [phone]     Social Determinants of Health (SDOH) Social History: SDOH Screenings   Food Insecurity: No Food Insecurity (04/10/2022)  Housing: Low Risk  (04/10/2022)  Transportation Needs: No Transportation Needs (04/10/2022)  Utilities: Not At Risk (04/10/2022)  Alcohol Screen: Low Risk  (04/10/2022)  Depression (PHQ2-9): Low Risk  (04/10/2022)  Financial Resource Strain: Low Risk  (04/10/2022)  Physical Activity: Inactive (04/10/2022)  Social Connections: Moderately Integrated (04/10/2022)  Stress: No Stress Concern Present (04/10/2022)  Tobacco Use: High Risk (05/31/2022)   SDOH Interventions:   Readmission Risk Interventions    09/28/2021   11:35 AM 08/05/2021   11:27 AM 07/01/2021    1:45 PM  Readmission Risk Prevention Plan  Transportation Screening Complete Complete Complete  Medication Review Press photographer) Complete Complete   PCP or Specialist appointment within 3-5 days of discharge   Not Complete  HRI or Home Care Consult Patient refused Patient refused Complete  SW Recovery Care/Counseling Consult Complete Complete Complete  Palliative Care Screening Not Applicable Not  Applicable Not [place] Not Applicable Not Applicable Not Applicable

## 2022-06-01 NOTE — Assessment & Plan Note (Signed)
Continue blood pressure control with amlodipine.  

## 2022-06-01 NOTE — Assessment & Plan Note (Addendum)
CT with positive signs of colitis,   Patient with improvement of her diarrhea.  I think pneumonia triggered GI symptoms,.  Will continue antibiotic therapy with ceftriaxone.  Continue antiacid therapy. Diarrhea is improving and follow up wbc  is 7.0

## 2022-06-01 NOTE — Assessment & Plan Note (Addendum)
Low K due to GI losses.  Renal function with serum cr at 0,40 with K at 4.2 and serum bicarbonate at 30. Na 138 and Mg at 1,9.   Plan to discontinue IV fluids and encourage po intake.  Out of bed to chair tid, PT and Ot

## 2022-06-01 NOTE — Assessment & Plan Note (Addendum)
COPD exacerbation.  Dense infiltrate at the left lower lobe. It seems that her respiratory symptoms preceded GI symptoms. 03/28 chest radiograph with hyperinflation with increased lung marking bilaterally with small bilateral pleural effusions.   Continue antibiotic therapy, will transition to cefdnir.  Bronchodilator therapy, and antitussive agents Airway clearing techniques. Systemic and inhaled corticosteroids. Continue pain control, (pleuritic left lower zone).  Continue oxymetry monitoring, supplemental 0-2 to keep 02 saturation 88% or greater.  Out of bed to chair tid with meals.  PT and OT.

## 2022-06-01 NOTE — Assessment & Plan Note (Addendum)
Continue pain control and supportive care. Chronic changes on abdominal CT in regards to her pancreas.  Continue antiacid therapy and pancreatic enzymes.  Follow up with nutrition recommendations.

## 2022-06-02 DIAGNOSIS — K529 Noninfective gastroenteritis and colitis, unspecified: Secondary | ICD-10-CM

## 2022-06-02 DIAGNOSIS — J189 Pneumonia, unspecified organism: Secondary | ICD-10-CM | POA: Diagnosis not present

## 2022-06-02 DIAGNOSIS — F17210 Nicotine dependence, cigarettes, uncomplicated: Secondary | ICD-10-CM | POA: Diagnosis not present

## 2022-06-02 DIAGNOSIS — K861 Other chronic pancreatitis: Secondary | ICD-10-CM | POA: Diagnosis not present

## 2022-06-02 LAB — CBC
HCT: 32.1 % — ABNORMAL LOW (ref 36.0–46.0)
Hemoglobin: 9.8 g/dL — ABNORMAL LOW (ref 12.0–15.0)
MCH: 33.2 pg (ref 26.0–34.0)
MCHC: 30.5 g/dL (ref 30.0–36.0)
MCV: 108.8 fL — ABNORMAL HIGH (ref 80.0–100.0)
Platelets: 254 10*3/uL (ref 150–400)
RBC: 2.95 MIL/uL — ABNORMAL LOW (ref 3.87–5.11)
RDW: 13.7 % (ref 11.5–15.5)
WBC: 7 10*3/uL (ref 4.0–10.5)
nRBC: 0 % (ref 0.0–0.2)

## 2022-06-02 LAB — BASIC METABOLIC PANEL
Anion gap: 11 (ref 5–15)
BUN: 8 mg/dL (ref 8–23)
CO2: 30 mmol/L (ref 22–32)
Calcium: 8.7 mg/dL — ABNORMAL LOW (ref 8.9–10.3)
Chloride: 97 mmol/L — ABNORMAL LOW (ref 98–111)
Creatinine, Ser: 0.4 mg/dL — ABNORMAL LOW (ref 0.44–1.00)
GFR, Estimated: >60 mL/min (ref >60)
Glucose, Bld: 97 mg/dL (ref 70–99)
Potassium: 4.2 mmol/L (ref 3.5–5.1)
Sodium: 138 mmol/L (ref 135–145)

## 2022-06-02 LAB — VITAMIN B12: Vitamin B-12: 837 pg/mL (ref 180–914)

## 2022-06-02 LAB — FOLATE: Folate: 4.2 ng/mL — ABNORMAL LOW (ref >5.9)

## 2022-06-02 LAB — MAGNESIUM: Magnesium: 1.9 mg/dL (ref 1.7–2.4)

## 2022-06-02 MED ORDER — ENSURE ENLIVE PO LIQD
237.0000 mL | Freq: Three times a day (TID) | ORAL | Status: DC
  Administered 2022-06-02 – 2022-06-06 (×11): 237 mL via ORAL

## 2022-06-02 MED ORDER — DM-GUAIFENESIN ER 30-600 MG PO TB12
2.0000 | ORAL_TABLET | Freq: Two times a day (BID) | ORAL | Status: DC
  Administered 2022-06-02 – 2022-06-06 (×9): 2 via ORAL
  Filled 2022-06-02 (×2): qty 1
  Filled 2022-06-02 (×8): qty 2

## 2022-06-02 MED ORDER — ZINC SULFATE 220 (50 ZN) MG PO CAPS
220.0000 mg | ORAL_CAPSULE | Freq: Every day | ORAL | Status: DC
  Administered 2022-06-02 – 2022-06-06 (×5): 220 mg via ORAL
  Filled 2022-06-02 (×5): qty 1

## 2022-06-02 MED ORDER — SODIUM CHLORIDE 0.9 % IV SOLN
INTRAVENOUS | Status: DC | PRN
  Administered 2022-06-02: 250 mL via INTRAVENOUS
  Administered 2022-06-03: 10 mL/h via INTRAVENOUS

## 2022-06-02 MED ORDER — ENOXAPARIN SODIUM 30 MG/0.3ML IJ SOSY
30.0000 mg | PREFILLED_SYRINGE | INTRAMUSCULAR | Status: DC
  Administered 2022-06-02 – 2022-06-05 (×4): 30 mg via SUBCUTANEOUS
  Filled 2022-06-02 (×5): qty 0.3

## 2022-06-02 NOTE — Progress Notes (Signed)
Initial Nutrition Assessment  DOCUMENTATION CODES:   Severe malnutrition in context of chronic illness  INTERVENTION:  Ensure Enlive po BID   Multivitamin daily   Check folate, Vit C, B-12 and zinc level  NUTRITION DIAGNOSIS:   Severe Malnutrition related to chronic illness (acute N/V/D on chronic pancreatitis) as evidenced by percent weight loss, severe fat depletion, severe muscle depletion.   GOAL:  Patient will meet greater than or equal to 90% of their needs  MONITOR:  PO intake, Supplement acceptance, Labs, Weight trends  REASON FOR ASSESSMENT:   Consult Assessment of nutrition requirement/status  ASSESSMENT: Patient is an underweight 67 yo female with hx of lung cancer, malnutrition, COPD (O2@6 -7 L), Chronic pancreatitis, tobacco use, chronic back pain. Presents with abdominal pain and N/V/D.   Regular diet tolerated per patient and no vomiting today. She reports 30% of dinner last night but no breakfast or lunch yesterday. This morning complains her food was cold and ate only a few bites. If pt po meal intake does not improve to >50% of meals/ONS recommend place NGT and provide enteral nutrition support due to her malnourished state.   She takes Creon with meals at home. Patient reports 6 weeks of nausea and vomiting. Chronic,ongoing malnutrition. She will drink Ensure either vanilla or strawberry but no Boost Breeze. No complaint of chewing / swallow difficulty.   Weight loss 10% (4.9 kg) x 3 months which is significant.   Medications reviewed and include: Creon, Remeron, Nicoderm, Oxycodone, morphine.   Mag -wnl, Albumin 1.7     Latest Ref Rng & Units 06/02/2022    6:12 AM 06/01/2022    3:15 AM 05/31/2022    5:21 PM  BMP  Glucose 70 - 99 mg/dL 97  144  100   BUN 8 - 23 mg/dL 8  <5  <5   Creatinine 0.44 - 1.00 mg/dL 0.40  0.49  0.35   Sodium 135 - 145 mmol/L 138  136  140   Potassium 3.5 - 5.1 mmol/L 4.2  5.0  <2.0   Chloride 98 - 111 mmol/L 97  104  112    CO2 22 - 32 mmol/L 30  24  19    Calcium 8.9 - 10.3 mg/dL 8.7  7.8  5.7       NUTRITION - FOCUSED PHYSICAL EXAM:  Flowsheet Row Most Recent Value  Orbital Region Mild depletion  Upper Arm Region Severe depletion  Thoracic and Lumbar Region Severe depletion  Buccal Region Moderate depletion  Temple Region Mild depletion  Clavicle Bone Region Severe depletion  Clavicle and Acromion Bone Region Severe depletion  Scapular Bone Region Severe depletion  Dorsal Hand Moderate depletion  Patellar Region Severe depletion  Anterior Thigh Region Severe depletion  Posterior Calf Region Severe depletion  Edema (RD Assessment) None  Hair Reviewed  Eyes Reviewed  Mouth Reviewed  Skin Reviewed  Nails Reviewed       Diet Order:   Diet Order             Diet regular Room service appropriate? Yes; Fluid consistency: Thin  Diet effective now                   EDUCATION NEEDS:  Education needs have been addressed  Skin:  Skin Assessment: Reviewed RN Assessment  Last BM:  3/28 diarrhea per nursing  Height:   Ht Readings from Last 1 Encounters:  06/01/22 5\' 3"  (1.6 m)    Weight:   Wt Readings from Last 1  Encounters:  06/01/22 44.5 kg    Ideal Body Weight:   52 kg  BMI:  Body mass index is 17.38 kg/m.  Estimated Nutritional Needs:   Kcal:  1500-1700  Protein:  80-88 gr  Fluid:  1500 ml daily  Colman Cater MS,RD,CSG,LDN Contact: Shea Evans

## 2022-06-02 NOTE — Evaluation (Signed)
Occupational Therapy Evaluation Patient Details Name: Jasmine [name] MRN: [id number] DOB: 09-17-1955 Today's Date: 06/02/2022   History of Present Illness Jasmine [name]  is a 67 y.o. female,  with history of COPD, chronic respiratory failure on 6-7 L, tobacco abuse, opioid dependence secondary to chronic back pain, hypertension, hyperlipidemia,nephrolithiasis, colon polyps (TA's), GERD, idiopathic pancreatitis, abnormal pancreas imaging with dilated duct, chronically dilated bile duct.  -Patient presents to ED secondary to complaints of abdominal pain, nausea, vomiting and diarrhea, she reports her diarrhea has been present for last few weeks, not improving, had 6 bowel movements today, as well she does report some dyspnea, she is on 6 L nasal cannula at baseline, reports sometimes she increases it up to 10, reports she is still smoking, she denies fever, chills, hematochezia, bright red blood per rectum. (Per MD)   Clinical Impression   Pt agreeable to OT and PT co-evaluation. Pt requires assist for ADL's at baseline. PRN assist for transfers at baseline. Pt was able to stand with supervision to min G assist from bed. Pt appears to be near baseline function and is not interested in home health therapy to increase strength. Pt was left in the bed with call bell within reach. Pt is not recommended for further acute OT services and will be discharged to care of nursing staff for remaining length of stay.      Recommendations for follow up therapy are one component of a multi-disciplinary discharge planning process, led by the attending physician.  Recommendations may be updated based on patient status, additional functional criteria and insurance authorization.   Assistance Recommended at Discharge Intermittent Supervision/Assistance  Patient can return home with the following A little help with walking and/or transfers;A little help with bathing/dressing/bathroom;Assistance with  cooking/housework;Assist for transportation;Help with stairs or ramp for entrance    Functional Status Assessment  Patient has not had a recent decline in their functional status  Equipment Recommendations  None recommended by OT           Precautions / Restrictions Precautions Precautions: Fall Restrictions Weight Bearing Restrictions: No      Mobility Bed Mobility      06/02/22 0923  Bed Mobility  Overal bed mobility Modified Independent  General bed mobility comments HOB elevated slightly                  Transfers Overall transfer level: Needs assistance   Transfers: Sit to/from Stand Sit to Stand: Supervision, Min guard           General transfer comment: Sit to stand from EOB.      Balance Overall balance assessment: Needs assistance Sitting-balance support: Bilateral upper extremity supported, Feet supported Sitting balance-Leahy Scale: Good Sitting balance - Comments: seated at EOB   Standing balance support: Bilateral upper extremity supported, During functional activity Standing balance-Leahy Scale: Fair Standing balance comment: using RW                           ADL either performed or assessed with clinical judgement   ADL Overall ADL's : Needs assistance/impaired     Grooming: Set up;Sitting   Upper Body Bathing: Set up;Sitting   Lower Body Bathing: Maximal assistance;Sitting/lateral leans       Lower Body Dressing: Maximal assistance;Sitting/lateral leans Lower Body Dressing Details (indicate cue type and reason): Max A to doff and don socks seated at [employer] Transfer: Min guard;Rolling walker (2 wheels);Stand-pivot;Supervision/safety Armed forces technical officer Details (indicate cue  type and reason): Partially simulated via sit to stand from EOB. Toileting- Clothing Manipulation and Hygiene: Sitting/lateral lean;Moderate assistance;Maximal assistance               Vision Baseline Vision/History: 4  Cataracts Ability to See in Adequate Light: 1 Impaired Patient Visual Report: No change from baseline Vision Assessment?: No apparent visual deficits                Pertinent Vitals/Pain Pain Assessment Pain Assessment: 0-10 Pain Score: 7  Pain Location: abdomen/ribs Pain Descriptors / Indicators: Guarding Pain Intervention(s): Limited activity within patient's tolerance, Monitored during session, Repositioned     Hand Dominance Right   Extremity/Trunk Assessment Upper Extremity Assessment Upper Extremity Assessment: Generalized weakness   Lower Extremity Assessment Lower Extremity Assessment: Defer to PT evaluation   Cervical / Trunk Assessment Cervical / Trunk Assessment: Kyphotic   Communication Communication Communication: No difficulties   Cognition     Overall Cognitive Status: Within Functional Limits for tasks assessed                                                        Home Living Family/patient expects to be discharged to:: Private residence Living Arrangements: Spouse/significant other Available Help at Discharge: Family;Available 24 hours/day Type of Home: House Home Access: Stairs to enter [employer] of Steps: 2 Entrance Stairs-Rails: Right;Left;Can reach both Home Layout: Able to live on main level with bedroom/bathroom;Two level Alternate Level Stairs-Number of Steps: flight   Bathroom Shower/Tub: Walk-in shower;Tub/shower unit   Bathroom Toilet: Handicapped height Bathroom Accessibility: Yes   Home Equipment: Rollator (4 wheels);Cane - single point;Shower seat;Grab bars - tub/shower;Grab bars - toilet;Wheelchair - manual;Hand held shower head          Prior Functioning/Environment Prior Level of Function : Needs assist             Mobility Comments: Household ambulator using RW mostly; PRN assist from husband. ADLs Comments: assisted by family                                 Co-evaluation PT/OT/SLP Co-Evaluation/Treatment: Yes Reason for Co-Treatment: To address functional/ADL transfers PT goals addressed during session: Mobility/safety with mobility OT goals addressed during session: ADL's and self-care                       End of Session Equipment Utilized During Treatment: Rolling walker (2 wheels);Oxygen  Activity Tolerance: Patient tolerated treatment well Patient left: in bed;with call bell/phone within reach  OT Visit Diagnosis: Unsteadiness on feet (R26.81);Other abnormalities of gait and mobility (R26.89)                Time: [id number] OT Time Calculation (min): 11 min Charges:  OT General Charges $OT Visit: 1 Visit OT Evaluation $OT Eval Low Complexity: 1 Low  Eann Cleland OT, MOT  Larey Seat 06/02/2022, 9:59 AM

## 2022-06-02 NOTE — Progress Notes (Addendum)
Patient reports that for last two days has only had one bm daily, reduced from 6 bms daily.  Continues to request pain medication for abd pain.  No c/o nausea.  Has had one diarrhea bm today

## 2022-06-02 NOTE — Progress Notes (Signed)
Progress Note   Patient: Jasmine [name] [id number] DOB: 1955/08/25 DOA: 05/31/2022     2 DOS: the patient was seen and examined on 06/02/2022   Brief hospital course: Mrs. Ferraris was admitted to the hospital with the working diagnosis of colitis complicated with hypokalemia.   67 yo female with the past medical history of COPD, opioid dependence, hypertension, dyslipidemia and chronic pancreatitis who presented with abdominal pain, nausea, vomiting and diarrhea for last few weeks. About 6 bowel movements per day. Prior to her abdominal symptoms patient had 3 weeks of cough, congestion and dyspnea. Progressive symptoms with productive cough.  On her initial physical examination her blood pressure was 107/64, HR 102, RR 14 and 02 saturation 88%, lungs with scattered wheezing with no rhonchi, heart with S1 and S2 present and rhythmic, tachycardic, abdomen soft with mild tenderness at the lower abdomen, no rebound or guarding, no lower extremity edema.   Na 140, K <2,0 Cl 112, bicarbonate 19, glucose 100, BUN <5, cr 0,35. Ca 5,7  Lipase 18 AST 11, ALT 10, T Bil 0,9 Wbc 11.7 hgb 11.2 plt 269   CT abdomen and pelvis with no evidence of obstruction, Positive wall thickening in the descending and rectosigmoid colon possibly indicating colitis.  Stable intra and extrahepatic duct dilatation and pancreatic duct dilatation. Pancreatic atrophy and calcification in the head of the pancreas.  Dense consolidations in the left lower lung and patchy infiltrates lower lobes bilaterally.   EKG 125 bpm, normal axis, normal intervals, sinus rhythm with PAC, no significant ST segment or  T wave changes.   03/29 patient with improvement in diarrhea, chest pain also improving, continue to have dyspnea and cough.   Assessment and Plan: * Colitis CT with positive signs of colitis,   Patient with improvement of her diarrhea.  I think pneumonia triggered GI symptoms,.  Will continue antibiotic therapy with  ceftriaxone.  Continue antiacid therapy. Diarrhea is improving and follow up wbc  is 7.0   Left lower lobe pneumonia COPD exacerbation.  Dense infiltrate at the left lower lobe. It seems that her respiratory symptoms preceded GI symptoms. 03/28 chest radiograph with hyperinflation with increased lung marking bilaterally with small bilateral pleural effusions.   Continue antibiotic therapy, change to ceftriaxone Bronchodilator therapy, and antitussive agents Airway clearing techniques. Systemic and inhaled corticosteroids. Continue pain control, (pleuritic left lower zone).  Continue oxymetry monitoring, supplemental 0-2 to keep 02 saturation 88% or greater.  Out of bed to chair tid with meals.  PT and OT.  Hypokalemia Low K due to GI losses.  Renal function with serum cr at 0,40 with K at 4.2 and serum bicarbonate at 30. Na 138 and Mg at 1,9.   Plan to discontinue IV fluids and encourage po intake.  Out of bed to chair tid, PT and Ot  Cigarette smoker Smoking cessation counseling.  Chronic pancreatitis ([place]) Continue pain control and supportive care. Chronic changes on abdominal CT in regards to her pancreas.  Continue antiacid therapy and pancreatic enzymes.  Follow up with nutrition recommendations.   Squamous cell lung cancer, right ([employer]) Follow up as outpatient. Hx of lobectomy on the right upper lobe   Chronic pain syndrome Continue pain control. Out of bed to chair, Pt and Ot.   Essential hypertension Continue blood pressure control with amlodipine.         Subjective: Patient with improvement in diarrhea and left sided chest pain, no nausea or vomiting, continue to have dyspnea and productive cough  Physical Exam: Vitals:   06/01/22 2029 06/02/22 0805 06/02/22 0808 06/02/22 0818  BP:    (!) 155/92  Pulse:    96  Resp:      Temp:    97.9 F (36.6 C)  TempSrc:    Oral  SpO2: 100% 99% (!) 0% 100%  Weight:      Height:       Neurology awake and  alert ENT with mild pallor Cardiovascular with S1 and S2 present and rhythmic with no gallops, rubs or murmurs No JVD No lower extremity edema Respiratory with raled and rhonchi bilaterally with no wheezing Abdomen with no distention  Data Reviewed:    Family Communication: no family at the bedside   Disposition: Status is: Inpatient Remains inpatient appropriate because: IV antibiotics,   Planned Discharge Destination: Home      Author: Tawni Millers, MD 06/02/2022 12:40 PM  For on call review [email or web address].

## 2022-06-02 NOTE — Evaluation (Signed)
Physical Therapy Evaluation Patient Details Name: Jasmine [name] MRN: [id number] DOB: 07-12-1955 Today's Date: 06/02/2022  History of Present Illness  Jasmine [name]  is a 67 y.o. female,  with history of COPD, chronic respiratory failure on 6-7 L, tobacco abuse, opioid dependence secondary to chronic back pain, hypertension, hyperlipidemia,nephrolithiasis, colon polyps (TA's), GERD, idiopathic pancreatitis, abnormal pancreas imaging with dilated duct, chronically dilated bile duct.  -Patient presents to ED secondary to complaints of abdominal pain, nausea, vomiting and diarrhea, she reports her diarrhea has been present for last few weeks, not improving, had 6 bowel movements today, as well she does report some dyspnea, she is on 6 L nasal cannula at baseline, reports sometimes she increases it up to 10, reports she is still smoking, she denies fever, chills, hematochezia, bright red blood per rectum.   Clinical Impression  Patient limited by not feeling well but is able to transition to seated EOB and transfer to standing without assist. Patient uses RW to transfer to standing and does not wish to perform any further mobility. Patient functioning near baseline and is typically assisted with ADL by family. Patient discharged to care of nursing for ambulation daily as tolerated for length of stay.        Recommendations for follow up therapy are one component of a multi-disciplinary discharge planning process, led by the attending physician.  Recommendations may be updated based on patient status, additional functional criteria and insurance authorization.  Follow Up Recommendations       Assistance Recommended at Discharge Intermittent Supervision/Assistance  Patient can return home with the following  A little help with walking and/or transfers;A little help with bathing/dressing/bathroom;Assistance with cooking/housework    Equipment Recommendations None recommended by PT  Recommendations  for Other Services       Functional Status Assessment Patient has had a recent decline in their functional status and demonstrates the ability to make significant improvements in function in a reasonable and predictable amount of time.     Precautions / Restrictions Precautions Precautions: Fall Restrictions Weight Bearing Restrictions: No      Mobility  Bed Mobility Overal bed mobility: Modified Independent             General bed mobility comments: HOB elevated slightly    Transfers Overall transfer level: Modified independent Equipment used: Rolling walker (2 wheels)                    Ambulation/Gait                  Stairs            Wheelchair Mobility    Modified Rankin (Stroke Patients Only)       Balance Overall balance assessment: Mild deficits observed, not formally tested                                           Pertinent Vitals/Pain Pain Assessment Pain Assessment: 0-10 Pain Score: 7  Pain Location: abdomen/ribs Pain Descriptors / Indicators: Guarding Pain Intervention(s): Limited activity within patient's tolerance, Monitored during session, Repositioned    Home Living Family/patient expects to be discharged to:: Private residence Living Arrangements: Spouse/significant other Available Help at Discharge: Family;Available 24 hours/day Type of Home: House Home Access: Stairs to enter Entrance Stairs-Rails: Right;Left;Can reach both Entrance Stairs-Number of Steps: 2 Alternate Level Stairs-Number of Steps: flight Home Layout:  Able to live on main level with bedroom/bathroom;Two level Home Equipment: Rollator (4 wheels);Cane - single point;Shower seat;Grab bars - tub/shower;Grab bars - toilet;Wheelchair - manual;Hand held shower head      Prior Function Prior Level of Function : Needs assist             Mobility Comments: Household ambulator using RW mostly ADLs Comments: assisted by family      Hand Dominance   Dominant Hand: Right    Extremity/Trunk Assessment   Upper Extremity Assessment Upper Extremity Assessment: Defer to OT evaluation    Lower Extremity Assessment Lower Extremity Assessment: Generalized weakness       Communication   Communication: No difficulties  Cognition Arousal/Alertness: Awake/alert Behavior During Therapy: WFL for tasks assessed/performed Overall Cognitive Status: Within Functional Limits for tasks assessed                                          General Comments      Exercises     Assessment/Plan    PT Assessment Patient does not need any further PT services  PT Problem List         PT Treatment Interventions      PT Goals (Current goals can be found in the Care Plan section)  Acute Rehab PT Goals Patient Stated Goal: return home with husband to assist PT Goal Formulation: With patient Time For Goal Achievement: 06/02/22 Potential to Achieve Goals: Good    Frequency       Co-evaluation PT/OT/SLP Co-Evaluation/Treatment: Yes Reason for Co-Treatment: To address functional/ADL transfers PT goals addressed during session: Mobility/safety with mobility         AM-PAC PT "6 Clicks" Mobility  Outcome Measure Help needed turning from your back to your side while in a flat bed without using bedrails?: None Help needed moving from lying on your back to sitting on the side of a flat bed without using bedrails?: A Little Help needed moving to and from a bed to a chair (including a wheelchair)?: A Little Help needed standing up from a chair using your arms (e.g., wheelchair or bedside chair)?: A Little Help needed to walk in hospital room?: A Little Help needed climbing 3-5 steps with a railing? : A Lot 6 Click Score: 18    End of Session Equipment Utilized During Treatment: Oxygen Activity Tolerance: Patient tolerated treatment well;Patient limited by fatigue Patient left: in bed;with call  bell/phone within reach Nurse Communication: Mobility status PT Visit Diagnosis: Unsteadiness on feet (R26.81);Other abnormalities of gait and mobility (R26.89);Muscle weakness (generalized) (M62.81)    Time: [id number] PT Time Calculation (min) (ACUTE ONLY): 10 min   Charges:   PT Evaluation $PT Eval Low Complexity: 1 Low          9:27 AM, 06/02/22 Mearl Latin PT, DPT Physical Therapist at [facility]

## 2022-06-03 DIAGNOSIS — J189 Pneumonia, unspecified organism: Secondary | ICD-10-CM | POA: Diagnosis not present

## 2022-06-03 DIAGNOSIS — K529 Noninfective gastroenteritis and colitis, unspecified: Secondary | ICD-10-CM | POA: Diagnosis not present

## 2022-06-03 DIAGNOSIS — C3491 Malignant neoplasm of unspecified part of right bronchus or lung: Secondary | ICD-10-CM

## 2022-06-03 DIAGNOSIS — K861 Other chronic pancreatitis: Secondary | ICD-10-CM | POA: Diagnosis not present

## 2022-06-03 DIAGNOSIS — F17210 Nicotine dependence, cigarettes, uncomplicated: Secondary | ICD-10-CM | POA: Diagnosis not present

## 2022-06-03 LAB — CBC
HCT: 37.7 % (ref 36.0–46.0)
Hemoglobin: 11 g/dL — ABNORMAL LOW (ref 12.0–15.0)
MCH: 32.6 pg (ref 26.0–34.0)
MCHC: 29.2 g/dL — ABNORMAL LOW (ref 30.0–36.0)
MCV: 111.9 fL — ABNORMAL HIGH (ref 80.0–100.0)
Platelets: 285 10*3/uL (ref 150–400)
RBC: 3.37 MIL/uL — ABNORMAL LOW (ref 3.87–5.11)
RDW: 13.5 % (ref 11.5–15.5)
WBC: 5.8 10*3/uL (ref 4.0–10.5)
nRBC: 0 % (ref 0.0–0.2)

## 2022-06-03 LAB — BASIC METABOLIC PANEL
Anion gap: 9 (ref 5–15)
BUN: 12 mg/dL (ref 8–23)
CO2: 33 mmol/L — ABNORMAL HIGH (ref 22–32)
Calcium: 9 mg/dL (ref 8.9–10.3)
Chloride: 96 mmol/L — ABNORMAL LOW (ref 98–111)
Creatinine, Ser: 0.4 mg/dL — ABNORMAL LOW (ref 0.44–1.00)
GFR, Estimated: >60 mL/min (ref >60)
Glucose, Bld: 88 mg/dL (ref 70–99)
Potassium: 3.9 mmol/L (ref 3.5–5.1)
Sodium: 138 mmol/L (ref 135–145)

## 2022-06-03 MED ORDER — CEFDINIR 300 MG PO CAPS
300.0000 mg | ORAL_CAPSULE | Freq: Two times a day (BID) | ORAL | Status: DC
  Administered 2022-06-03 – 2022-06-06 (×6): 300 mg via ORAL
  Filled 2022-06-03 (×6): qty 1

## 2022-06-03 MED ORDER — AMOXICILLIN-POT CLAVULANATE 875-125 MG PO TABS: 1.0000 | ORAL_TABLET | Freq: Two times a day (BID) | ORAL | Status: DC

## 2022-06-03 NOTE — Progress Notes (Addendum)
Progress Note   Patient: Jasmine [name] [id number] DOB: 12-05-1955 DOA: 05/31/2022     3 DOS: the patient was seen and examined on 06/03/2022   Brief hospital course: Mrs. Plybon was admitted to the hospital with the working diagnosis of colitis complicated with hypokalemia.   67 yo female with the past medical history of COPD, opioid dependence, hypertension, dyslipidemia and chronic pancreatitis who presented with abdominal pain, nausea, vomiting and diarrhea for last few weeks. About 6 bowel movements per day. Prior to her abdominal symptoms patient had 3 weeks of cough, congestion and dyspnea. Progressive symptoms with productive cough.  On her initial physical examination her blood pressure was 107/64, HR 102, RR 14 and 02 saturation 88%, lungs with scattered wheezing with no rhonchi, heart with S1 and S2 present and rhythmic, tachycardic, abdomen soft with mild tenderness at the lower abdomen, no rebound or guarding, no lower extremity edema.   Na 140, K <2,0 Cl 112, bicarbonate 19, glucose 100, BUN <5, cr 0,35. Ca 5,7  Lipase 18 AST 11, ALT 10, T Bil 0,9 Wbc 11.7 hgb 11.2 plt 269   CT abdomen and pelvis with no evidence of obstruction, Positive wall thickening in the descending and rectosigmoid colon possibly indicating colitis.  Stable intra and extrahepatic duct dilatation and pancreatic duct dilatation. Pancreatic atrophy and calcification in the head of the pancreas.  Dense consolidations in the left lower lung and patchy infiltrates lower lobes bilaterally.   EKG 125 bpm, normal axis, normal intervals, sinus rhythm with PAC, no significant ST segment or  T wave changes.   03/29 patient with improvement in diarrhea, chest pain also improving, continue to have dyspnea and cough.  03/30 patient is feeling better, but having persistent diarrhea.   Assessment and Plan: * Colitis CT with positive signs of colitis,    I think pneumonia triggered GI symptoms,.  Patient with  persistent diarrhea today, will transition to oral antibiotic therapy.   Left lower lobe pneumonia COPD exacerbation.  Dense infiltrate at the left lower lobe. It seems that her respiratory symptoms preceded GI symptoms. 03/28 chest radiograph with hyperinflation with increased lung marking bilaterally with small bilateral pleural effusions.   Continue antibiotic therapy, will transition to cefdnir.  Bronchodilator therapy, and antitussive agents Airway clearing techniques. Systemic and inhaled corticosteroids. Continue pain control, (pleuritic left lower zone).  Continue oxymetry monitoring, supplemental 0-2 to keep 02 saturation 88% or greater.  Out of bed to chair tid with meals.  PT and OT.  Hypokalemia Low K due to GI losses.  Renal function is stable today with serum cr at 0,40 with K at 3,9 and serum bicarbonate at 33. Continue close monitor of electrolytes.  Continue to hold on IV fluids.   Cigarette smoker Smoking cessation counseling.  Chronic pancreatitis ([place]) Continue pain control and supportive care. Chronic changes on abdominal CT in regards to her pancreas.  Continue antiacid therapy and pancreatic enzymes.  Follow up with nutrition recommendations.   Squamous cell lung cancer, right ([place]) Follow up as outpatient. Hx of lobectomy on the right upper lobe   Chronic pain syndrome Continue pain control. Out of bed to chair, Pt and Ot.   Essential hypertension Continue blood pressure control with amlodipine.         Subjective: Patient with improvement in her dyspnea and cough, she continue to have watery stools, no nausea or vomiting,   Physical Exam: Vitals:   06/02/22 2116 06/03/22 0418 06/03/22 0745 06/03/22 1224  BP:  Marland Kitchen)  160/94  105/66  Pulse:  97  95  Resp:  20  18  Temp:  97.6 F (36.4 C)  97.8 F (36.6 C)  TempSrc:    Oral  SpO2: 98% 100% 99% 96%  Weight:      Height:       Neurology awake and alert. Deconditioned ENT with pallor  but no icterus Cardiovascular with S1 and S2 present and rhythmic with no gallops, rubs or  murmurs Respiratory with mild rales bilaterally with no wheezing or rhonchi Abdomen is distended and tympanic, non tender to palpation, no rebound No lower extremity edema  Data Reviewed:    Family Communication: no family at the bedside   Disposition: Status is: Inpatient Remains inpatient appropriate because: antibiotic therapy and monitoring diarrhea improvement   Planned Discharge Destination: Home     Author: Tawni Millers, MD 06/03/2022 1:24 PM  For on call review [email or web address].

## 2022-06-03 NOTE — Progress Notes (Signed)
Has had two loose stools on day shift today. Continues to request morphine 2 mg Q2h.

## 2022-06-04 DIAGNOSIS — K529 Noninfective gastroenteritis and colitis, unspecified: Secondary | ICD-10-CM | POA: Diagnosis not present

## 2022-06-04 DIAGNOSIS — J189 Pneumonia, unspecified organism: Secondary | ICD-10-CM | POA: Diagnosis not present

## 2022-06-04 LAB — BASIC METABOLIC PANEL
Anion gap: 8 (ref 5–15)
BUN: 13 mg/dL (ref 8–23)
CO2: 30 mmol/L (ref 22–32)
Calcium: 9.2 mg/dL (ref 8.9–10.3)
Chloride: 97 mmol/L — ABNORMAL LOW (ref 98–111)
Creatinine, Ser: 0.5 mg/dL (ref 0.44–1.00)
GFR, Estimated: >60 mL/min (ref >60)
Glucose, Bld: 117 mg/dL — ABNORMAL HIGH (ref 70–99)
Potassium: 4.4 mmol/L (ref 3.5–5.1)
Sodium: 135 mmol/L (ref 135–145)

## 2022-06-04 LAB — MAGNESIUM: Magnesium: 1.8 mg/dL (ref 1.7–2.4)

## 2022-06-04 MED ORDER — FOLIC ACID 1 MG PO TABS
1.0000 mg | ORAL_TABLET | Freq: Every day | ORAL | Status: DC
  Administered 2022-06-04 – 2022-06-06 (×3): 1 mg via ORAL
  Filled 2022-06-04 (×3): qty 1

## 2022-06-04 NOTE — Progress Notes (Signed)
Patient has been up about every two hours during this shift. Patient given prn medication 6 times. Patient had an ultrasound IV placed by Jacqulynn Cadet, RN at 0330 due to her other site being painful. Patient tolerated well. Patient had not had any loose stools at this time. Plan of care ongoing.

## 2022-06-04 NOTE — Progress Notes (Signed)
Stopped by at 1948 to give neb treatment , patient wanted to wait 30 minutes after taking her pain rx. This is not first time she has done this to me and other therapist.

## 2022-06-04 NOTE — Progress Notes (Signed)
[facility] PROGRESS NOTE   Jasmine [name] [id number] DOB: 06/12/1955 DOA: 05/31/2022  PCP: Janora Norlander, DO  Brief History/Interval Summary: 67 yo female with the past medical history of COPD, opioid dependence, hypertension, dyslipidemia and chronic pancreatitis who presented with abdominal pain, nausea, vomiting and diarrhea. About 6 bowel movements per day. Prior to her abdominal symptoms patient had 3 weeks of cough, congestion and dyspnea. Progressive symptoms with productive cough.  She uses 6 L of oxygen at home at baseline.  Concern was for colitis along with pneumonia.  She was hospitalized for further management.  Consultants: None  Procedures: None    Subjective/Interval History: Patient has not had any diarrhea since yesterday afternoon.  Denies any nausea.  Shortness of breath is improving.  Cough is getting better.     Assessment/Plan:  Colitis CT with positive signs of colitis.  Unfortunately no stool studies were sent. Patient noted to have been started on antibiotics intravenously initially.  Now patient is on cefdinir. Patient mentions that she was diagnosed with C. difficile about 6 months ago.  The only positive C. difficile test available in our system is from September 2022. No results in [place]. Since diarrhea has subsided we will not change management at this time. Chronic pancreatitis may have contributed to her diarrhea but CT did suggest colitis.  Could consider adding Flagyl to her regimen if diarrhea were to recur.   Left lower lobe pneumonia COPD exacerbation.  Dense infiltrate at the left lower lobe. It seems that her respiratory symptoms preceded GI symptoms. 03/28 chest radiograph with hyperinflation with increased lung marking bilaterally with small bilateral pleural effusions.  Patient was initially on IV antibiotics.  Now on oral antibiotics. Patient on prednisone.  Also on nebulizer treatments and  inhalers. Respiratory status is stable. Followed by Dr. Melvyn Novas.  Chronic respiratory failure with hypoxia On 6 L of oxygen at home chronically.  Saturations are stable here in the hospital.  Hypokalemia Low K due to GI losses. Improved.   Cigarette smoker Smoking cessation counseling.   Chronic pancreatitis ([place]) Continue pain control and supportive care. Chronic changes on abdominal CT in regards to her pancreas.  Looks like she has been referred to gastroenterology in [place] for an endoscopic ultrasound considering her chronic pancreatitis. Continue antiacid therapy and pancreatic enzymes.  Follow up with nutrition recommendations.    Squamous cell lung cancer, right ([place]) Follow up as outpatient. Hx of lobectomy on the right upper lobe    Chronic pain syndrome Continue pain control.   Essential hypertension Continue blood pressure control with amlodipine.   Macrocytic anemia Hemoglobin is stable.  No evidence of overt blood loss.  Outpatient monitoring.  Severe protein calorie malnutrition. Nutrition Problem: Severe Malnutrition Etiology: chronic illness (acute N/V/D on chronic pancreatitis)  DVT Prophylaxis: Lovenox Code Status: Full code Family Communication: Discussed with patient Disposition Plan: Hopefully return home when improved  Status is: Inpatient Remains inpatient appropriate because: Pneumonia and colitis      Medications: Scheduled:  amLODipine  10 mg Oral Daily   budesonide  0.25 mg Nebulization BID   cefdinir  300 mg Oral Q12H   dextromethorphan-guaiFENesin  2 tablet Oral BID   enoxaparin (LOVENOX) injection  30 mg Subcutaneous Q24H   feeding supplement  237 mL Oral TID WC   ipratropium-albuterol  3 mL Nebulization TID   lipase/protease/amylase  48,000 Units Oral TID WC   mirtazapine  15 mg Oral QHS   nicotine  21 mg Transdermal  Daily   oxybutynin  5 mg Oral Daily   oxyCODONE  15 mg Oral Q12H   pantoprazole  40 mg Oral Daily    predniSONE  40 mg Oral Q breakfast   traZODone  300 mg Oral QHS   zinc sulfate  220 mg Oral Daily   Continuous:  sodium chloride 10 mL/hr (06/03/22 1157)   [id number] chloride, ipratropium-albuterol, lipase/protease/amylase, morphine injection, oxyCODONE, tiZANidine  Antibiotics: Anti-infectives (From admission, onward)    Start     Dose/Rate Route Frequency Ordered Stop   06/03/22 2200  cefdinir (OMNICEF) capsule 300 mg        300 mg Oral Every 12 hours 06/03/22 1414     06/03/22 1415  amoxicillin-clavulanate (AUGMENTIN) 875-125 MG per tablet 1 tablet  Status:  Discontinued        1 tablet Oral Every 12 hours 06/03/22 1327 06/03/22 1414   06/01/22 1130  cefTRIAXone (ROCEPHIN) 1 g in sodium chloride 0.9 % 100 mL IVPB  Status:  Discontinued        1 g 200 mL/hr over 30 Minutes Intravenous Every 24 hours 06/01/22 1127 06/03/22 1327   05/31/22 2100  piperacillin-tazobactam (ZOSYN) IVPB 3.375 g  Status:  Discontinued        3.375 g 12.5 mL/hr over 240 Minutes Intravenous Every 8 hours 05/31/22 2059 06/01/22 1127   05/31/22 2030  ciprofloxacin (CIPRO) IVPB 400 mg        400 mg 200 mL/hr over 60 Minutes Intravenous  Once 05/31/22 2029 05/31/22 2139   05/31/22 2030  metroNIDAZOLE (FLAGYL) IVPB 500 mg        500 mg 100 mL/hr over 60 Minutes Intravenous  Once 05/31/22 2029 05/31/22 2122       Objective:  Vital Signs  Vitals:   06/03/22 1950 06/03/22 1955 06/03/22 2048 06/04/22 0357  BP:   118/72 (!) 151/89  Pulse:   (!) 103 98  Resp:   18 19  Temp:   98.7 F (37.1 C) 97.9 F (36.6 C)  TempSrc:      SpO2: 96% 96% 100% 100%  Weight:      Height:        Intake/Output Summary (Last 24 hours) at 06/04/2022 0914 Last data filed at 06/04/2022 0300 Gross per 24 hour  Intake 498.89 ml  Output 300 ml  Net 198.89 ml   Filed Weights   06/01/22 1147  Weight: 44.5 kg    General appearance: Awake alert.  In no distress Resp: Clear to auscultation bilaterally.  Normal  effort Cardio: S1-S2 is normal regular.  No S3-S4.  No rubs murmurs or bruit GI: Abdomen is soft.  Nontender nondistended.  Bowel sounds are present normal.  No masses organomegaly Extremities: No edema.  Full range of motion of lower extremities. Neurologic: Alert and oriented x3.  No focal neurological deficits.    Lab Results:  Data Reviewed: I have personally reviewed following labs and reports of the imaging studies  CBC: Recent Labs  Lab 05/31/22 1721 06/01/22 0331 06/02/22 0612 06/03/22 0446  WBC 11.7* 7.6 7.0 5.8  NEUTROABS 9.8*  --   --   --   HGB 11.2* 9.4* 9.8* 11.0*  HCT 38.9 31.2* 32.1* 37.7  MCV 115.4* 111.0* 108.8* 111.9*  PLT 269 232 254 [id number]    Basic Metabolic Panel: Recent Labs  Lab 05/31/22 1721 06/01/22 0315 06/02/22 0612 06/03/22 0446 06/04/22 0522  NA 140 136 138 138 135  K <2.0* 5.0 4.2 3.9 4.4  CL  112* 104 97* 96* 97*  CO2 19* 24 30 33* 30  GLUCOSE 100* 144* 97 88 117*  BUN <5* <5* 8 12 13   CREATININE 0.35* 0.49 0.40* 0.40* 0.50  CALCIUM 5.7* 7.8* 8.7* 9.0 9.2  MG 1.1* 1.9 1.9  --  1.8    GFR: Estimated Creatinine Clearance: 47.3 mL/min (by C-G formula based on SCr of 0.5 mg/dL).  Liver Function Tests: Recent Labs  Lab 05/31/22 1721  AST 11*  ALT 10  ALKPHOS 92  BILITOT 0.9  PROT 4.1*  ALBUMIN 1.7*    Recent Labs  Lab 05/31/22 1721  LIPASE 18     Anemia Panel: Recent Labs    06/02/22 1442  VITAMINB12 837  FOLATE 4.2*    No results found for this or any previous visit (from the past 240 hour(s)).    Radiology Studies: No results found.     LOS: 4 days   Barnaby Rippeon [employer] on [email or web address]  06/04/2022, 9:14 AM

## 2022-06-05 DIAGNOSIS — K529 Noninfective gastroenteritis and colitis, unspecified: Secondary | ICD-10-CM | POA: Diagnosis not present

## 2022-06-05 DIAGNOSIS — J189 Pneumonia, unspecified organism: Secondary | ICD-10-CM | POA: Diagnosis not present

## 2022-06-05 MED ORDER — DIPHENOXYLATE-ATROPINE 2.5-0.025 MG PO TABS
1.0000 | ORAL_TABLET | Freq: Four times a day (QID) | ORAL | Status: AC
  Administered 2022-06-05 (×2): 1 via ORAL
  Filled 2022-06-05 (×2): qty 1

## 2022-06-05 MED ORDER — SACCHAROMYCES BOULARDII 250 MG PO CAPS
250.0000 mg | ORAL_CAPSULE | Freq: Two times a day (BID) | ORAL | Status: DC
  Administered 2022-06-05 – 2022-06-06 (×3): 250 mg via ORAL
  Filled 2022-06-05 (×3): qty 1

## 2022-06-05 MED ORDER — DIPHENOXYLATE-ATROPINE 2.5-0.025 MG PO TABS: 1.0000 | ORAL_TABLET | Freq: Four times a day (QID) | ORAL | Status: DC | PRN

## 2022-06-05 MED ORDER — KETOROLAC TROMETHAMINE 15 MG/ML IJ SOLN
15.0000 mg | Freq: Four times a day (QID) | INTRAMUSCULAR | Status: DC | PRN
  Administered 2022-06-05 (×2): 15 mg via INTRAVENOUS
  Filled 2022-06-05 (×2): qty 1

## 2022-06-05 MED ORDER — IPRATROPIUM-ALBUTEROL 0.5-2.5 (3) MG/3ML IN SOLN
3.0000 mL | Freq: Two times a day (BID) | RESPIRATORY_TRACT | Status: DC
  Administered 2022-06-05: 3 mL via RESPIRATORY_TRACT
  Filled 2022-06-05 (×2): qty 3

## 2022-06-05 NOTE — Progress Notes (Signed)
Continues requesting morphine q2h.  Stated that has had 2 bms today which are not as liquid but mushy.

## 2022-06-05 NOTE — Progress Notes (Signed)
Mobility Specialist Progress Note:    06/05/22 1330  Mobility  Activity Dangled on edge of bed  Level of Assistance Standby assist, set-up cues, supervision of patient - no hands on  Assistive Device None  Activity Response Tolerated well  Mobility Referral No  $Mobility charge 1 Mobility   Pt strongly refused ambulation and/or standing at beside, was able to demonstrate how they dangle EOB. 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 Pt reported they use RW and WC as AD at home. Pt needed no assistance when sitting EOB, able to hold themself up, and return to supine position independently. Tolerated well, asx throughout, SpO2 95% on  6L. Left pt with all needs met, call bell in reach.   Royetta Crochet Mobility Specialist Please contact via Solicitor or  Rehab office at [phone]

## 2022-06-05 NOTE — Progress Notes (Signed)
Patient slept for 6 hours during this shift. Prn medications given 5 times. Patient reported no loose stools during the night. Plan of care ongoing.

## 2022-06-05 NOTE — Progress Notes (Signed)
[facility] PROGRESS NOTE   Jasmine [name] [id number] DOB: 06-16-1955 DOA: 05/31/2022  PCP: Janora Norlander, DO  Brief History/Interval Summary: 67 yo female with the past medical history of COPD, opioid dependence, hypertension, dyslipidemia and chronic pancreatitis who presented with abdominal pain, nausea, vomiting and diarrhea. About 6 bowel movements per day. Prior to her abdominal symptoms patient had 3 weeks of cough, congestion and dyspnea. Progressive symptoms with productive cough.  She uses 6 L of oxygen at home at baseline.  Concern was for colitis along with pneumonia.  She was hospitalized for further management.  Consultants: None  Procedures: None    Subjective/Interval History: Patient had recurrence of diarrhea.  Complains of abdominal cramping.  No chest pain or shortness of breath.     Assessment/Plan:  Colitis versus diarrhea due to chronic pancreatitis  Diarrhea thought to be due to either chronic pancreatitis or colitis.  Only mild wall thickening was noted on CT scan.  No stool studies were sent.   Patient mentions that she was diagnosed with C. difficile about 6 months ago.  The only positive C. difficile test available in our system is from September 2022. No results in [place]. Will use Lomotil for diarrhea.  Does not appear to be infectious since this has been ongoing for several weeks.  She is afebrile and WBC is normal. Give probiotics.   Left lower lobe pneumonia COPD exacerbation.  Dense infiltrate at the left lower lobe. It seems that her respiratory symptoms preceded GI symptoms. 03/28 chest radiograph with hyperinflation with increased lung marking bilaterally with small bilateral pleural effusions.  Patient was initially on IV antibiotics.  Now on oral antibiotics. Patient on prednisone.  Also on nebulizer treatments and inhalers. Respiratory status is stable. Followed by Dr. Melvyn Novas.  Chronic respiratory failure with  hypoxia On 6 L of oxygen at home chronically.  Saturations are stable here in the hospital.  Hypokalemia Low K due to GI losses. Improved.   Cigarette smoker Smoking cessation counseling.   Chronic pancreatitis ([place]) Continue pain control and supportive care. Chronic changes on abdominal CT in regards to her pancreas.  Looks like she has been referred to gastroenterology in [place] for an endoscopic ultrasound considering her chronic pancreatitis. Continue antiacid therapy and pancreatic enzymes.  Follow up with nutrition recommendations.    Squamous cell lung cancer, right ([place]) Follow up as outpatient. Hx of lobectomy on the right upper lobe    Chronic pain syndrome Continue pain control.   Essential hypertension Continue blood pressure control with amlodipine.   Macrocytic anemia Hemoglobin is stable.  No evidence of overt blood loss.  Outpatient monitoring.  Severe protein calorie malnutrition. Nutrition Problem: Severe Malnutrition Etiology: chronic illness (acute N/V/D on chronic pancreatitis)  DVT Prophylaxis: Lovenox Code Status: Full code Family Communication: Discussed with patient Disposition Plan: Hopefully return home when improved  Status is: Inpatient Remains inpatient appropriate because: Pneumonia and colitis      Medications: Scheduled:  amLODipine  10 mg Oral Daily   budesonide  0.25 mg Nebulization BID   cefdinir  300 mg Oral Q12H   dextromethorphan-guaiFENesin  2 tablet Oral BID   diphenoxylate-atropine  1 tablet Oral QID   enoxaparin (LOVENOX) injection  30 mg Subcutaneous Q24H   feeding supplement  237 mL Oral TID WC   folic acid  1 mg Oral Daily   ipratropium-albuterol  3 mL Nebulization BID   lipase/protease/amylase  48,000 Units Oral TID WC   mirtazapine  15  mg Oral QHS   nicotine  21 mg Transdermal Daily   oxybutynin  5 mg Oral Daily   oxyCODONE  15 mg Oral Q12H   pantoprazole  40 mg Oral Daily   predniSONE  40 mg Oral Q  breakfast   traZODone  300 mg Oral QHS   zinc sulfate  220 mg Oral Daily   Continuous:  sodium chloride 10 mL/hr (06/03/22 1157)   [id number] chloride, diphenoxylate-atropine, ipratropium-albuterol, ketorolac, lipase/protease/amylase, morphine injection, oxyCODONE, tiZANidine  Antibiotics: Anti-infectives (From admission, onward)    Start     Dose/Rate Route Frequency Ordered Stop   06/03/22 2200  cefdinir (OMNICEF) capsule 300 mg        300 mg Oral Every 12 hours 06/03/22 1414     06/03/22 1415  amoxicillin-clavulanate (AUGMENTIN) 875-125 MG per tablet 1 tablet  Status:  Discontinued        1 tablet Oral Every 12 hours 06/03/22 1327 06/03/22 1414   06/01/22 1130  cefTRIAXone (ROCEPHIN) 1 g in sodium chloride 0.9 % 100 mL IVPB  Status:  Discontinued        1 g 200 mL/hr over 30 Minutes Intravenous Every 24 hours 06/01/22 1127 06/03/22 1327   05/31/22 2100  piperacillin-tazobactam (ZOSYN) IVPB 3.375 g  Status:  Discontinued        3.375 g 12.5 mL/hr over 240 Minutes Intravenous Every 8 hours 05/31/22 2059 06/01/22 1127   05/31/22 2030  ciprofloxacin (CIPRO) IVPB 400 mg        400 mg 200 mL/hr over 60 Minutes Intravenous  Once 05/31/22 2029 05/31/22 2139   05/31/22 2030  metroNIDAZOLE (FLAGYL) IVPB 500 mg        500 mg 100 mL/hr over 60 Minutes Intravenous  Once 05/31/22 2029 05/31/22 2122       Objective:  Vital Signs  Vitals:   06/04/22 1951 06/04/22 2045 06/05/22 0406 06/05/22 0721  BP: 123/79  (!) 142/83   Pulse: 96  (!) 102   Resp: 17  19   Temp: 98.3 F (36.8 C)  98.3 F (36.8 C)   TempSrc: Oral  Oral   SpO2: 100% 100% 100% 100%  Weight:      Height:        Intake/Output Summary (Last 24 hours) at 06/05/2022 1048 Last data filed at 06/05/2022 0539 Gross per 24 hour  Intake 480 ml  Output 500 ml  Net -20 ml    Filed Weights   06/01/22 1147  Weight: 44.5 kg    General appearance: Awake alert.  In no distress Resp: Clear to auscultation bilaterally.   Normal effort Cardio: S1-S2 is normal regular.  No S3-S4.  No rubs murmurs or bruit GI: Abdomen is soft.  Mildly tender without any rebound rigidity or guarding.  No masses organomegaly. Extremities: No edema.  Full range of motion of lower extremities. Neurologic: Alert and oriented x3.  No focal neurological deficits.      Lab Results:  Data Reviewed: I have personally reviewed following labs and reports of the imaging studies  CBC: Recent Labs  Lab 05/31/22 1721 06/01/22 0331 06/02/22 0612 06/03/22 0446  WBC 11.7* 7.6 7.0 5.8  NEUTROABS 9.8*  --   --   --   HGB 11.2* 9.4* 9.8* 11.0*  HCT 38.9 31.2* 32.1* 37.7  MCV 115.4* 111.0* 108.8* 111.9*  PLT 269 232 254 285     Basic Metabolic Panel: Recent Labs  Lab 05/31/22 1721 06/01/22 0315 06/02/22 0612 06/03/22 0446 06/04/22 0522  NA 140 136 138 138 135  K <2.0* 5.0 4.2 3.9 4.4  CL 112* 104 97* 96* 97*  CO2 19* 24 30 33* 30  GLUCOSE 100* 144* 97 88 117*  BUN <5* <5* 8 12 13   CREATININE 0.35* 0.49 0.40* 0.40* 0.50  CALCIUM 5.7* 7.8* 8.7* 9.0 9.2  MG 1.1* 1.9 1.9  --  1.8     GFR: Estimated Creatinine Clearance: 47.3 mL/min (by C-G formula based on SCr of 0.5 mg/dL).  Liver Function Tests: Recent Labs  Lab 05/31/22 1721  AST 11*  ALT 10  ALKPHOS 92  BILITOT 0.9  PROT 4.1*  ALBUMIN 1.7*     Recent Labs  Lab 05/31/22 1721  LIPASE 18      Anemia Panel: Recent Labs    06/02/22 1442  VITAMINB12 837  FOLATE 4.2*     Radiology Studies: No results found.     LOS: 5 days   Undrea Archbold [employer] on [email or web address]  06/05/2022, 10:48 AM

## 2022-06-06 DIAGNOSIS — K529 Noninfective gastroenteritis and colitis, unspecified: Secondary | ICD-10-CM | POA: Diagnosis not present

## 2022-06-06 MED ORDER — CEFDINIR 300 MG PO CAPS: 300.0000 mg | ORAL_CAPSULE | Freq: Two times a day (BID) | ORAL | 0 refills | Status: AC

## 2022-06-06 MED ORDER — SACCHAROMYCES BOULARDII 250 MG PO CAPS: 250.0000 mg | ORAL_CAPSULE | Freq: Two times a day (BID) | ORAL | 0 refills | Status: DC

## 2022-06-06 MED ORDER — PREDNISONE 20 MG PO TABS: ORAL_TABLET | ORAL | 0 refills | Status: DC

## 2022-06-06 MED ORDER — FOLIC ACID 1 MG PO TABS: 1.0000 mg | ORAL_TABLET | Freq: Every day | ORAL | 2 refills | Status: DC

## 2022-06-06 MED ORDER — DIPHENOXYLATE-ATROPINE 2.5-0.025 MG PO TABS: 1.0000 | ORAL_TABLET | Freq: Three times a day (TID) | ORAL | 0 refills | Status: DC | PRN

## 2022-06-06 NOTE — Plan of Care (Signed)

## 2022-06-06 NOTE — Discharge Summary (Signed)
[facility]  Physician Discharge Summary   Patient ID: Jasmine [name] MRN: [id number] DOB/AGE: [birth year] 67 y.o.  Admit date: 05/31/2022 Discharge date:   06/06/2022   PCP: Janora Norlander, DO  DISCHARGE DIAGNOSES:   Diarrhea   Left lower lobe pneumonia   Hypokalemia   Cigarette smoker   Chronic pancreatitis   Squamous cell lung cancer, right   Chronic pain syndrome   Essential hypertension   RECOMMENDATIONS FOR OUTPATIENT FOLLOW UP: Patient to follow-up with her gastroenterologist for further management of diarrhea/chronic pancreatitis   Home Health: None Equipment/Devices: None  CODE STATUS: Full code  DISCHARGE CONDITION: fair  Diet recommendation: As before  INITIAL HISTORY: 67 yo female with the past medical history of COPD, opioid dependence, hypertension, dyslipidemia and chronic pancreatitis who presented with abdominal pain, nausea, vomiting and diarrhea. About 6 bowel movements per day. Prior to her abdominal symptoms patient had 3 weeks of cough, congestion and dyspnea. Progressive symptoms with productive cough. She uses 6 L of oxygen at home at baseline. Concern was for colitis along with pneumonia. She was hospitalized for further management.    HOSPITAL COURSE:   Colitis versus diarrhea due to chronic pancreatitis  Diarrhea thought to be due to either chronic pancreatitis or colitis.  Only mild wall thickening was noted on CT scan.  No stool studies were sent.   Patient mentions that she was diagnosed with C. difficile about 6 months ago.  The only positive C. difficile test available in our system is from September 2022. No results in [place]. Does not appear to be infectious since this has been ongoing for several weeks.  She is afebrile and WBC is normal. She was given Lomotil and probiotics with improvement in her diarrhea. Outpatient follow-up with gastroenterology.   Left lower lobe pneumonia COPD exacerbation.  Dense  infiltrate at the left lower lobe. It seems that her respiratory symptoms preceded GI symptoms. 03/28 chest radiograph with hyperinflation with increased lung marking bilaterally with small bilateral pleural effusions.  Patient was initially on IV antibiotics.  Now on oral antibiotics. Patient on prednisone.  Also on nebulizer treatments and inhalers. Respiratory status is stable. Followed by Dr. Melvyn Novas. Will be discharged on tapering doses of steroids and a few more days of antibiotics.   Chronic respiratory failure with hypoxia On 6 L of oxygen at home chronically.  Saturations are stable here in the hospital.   Hypokalemia Low K due to GI losses. Improved.   Cigarette smoker Smoking cessation counseling.   Chronic pancreatitis ([place]) Continue pain control and supportive care. Chronic changes on abdominal CT in regards to her pancreas.  Looks like she has been referred to gastroenterology in [place] for an endoscopic ultrasound considering her chronic pancreatitis. Continue antiacid therapy and pancreatic enzymes.    Squamous cell lung cancer, right ([place]) Follow up as outpatient. Hx of lobectomy on the right upper lobe    Chronic pain syndrome Continue pain control.   Essential hypertension Continue blood pressure control with amlodipine.    Macrocytic anemia Hemoglobin is stable.  No evidence of overt blood loss.  Outpatient monitoring.   Severe protein calorie malnutrition. Nutrition Problem: Severe Malnutrition Etiology: chronic illness (acute N/V/D on chronic pancreatitis)  Patient is stable.  Okay for discharge home today.   PERTINENT LABS:  The results of significant diagnostics from this hospitalization (including imaging, microbiology, ancillary and laboratory) are listed below for reference.    Labs:   Basic Metabolic Panel: Recent Labs  Lab  05/31/22 1721 06/01/22 0315 06/02/22 0612 06/03/22 0446 06/04/22 0522  NA 140 136 138 138 135  K <2.0* 5.0  4.2 3.9 4.4  CL 112* 104 97* 96* 97*  CO2 19* 24 30 33* 30  GLUCOSE 100* 144* 97 88 117*  BUN <5* <5* 8 12 13   CREATININE 0.35* 0.49 0.40* 0.40* 0.50  CALCIUM 5.7* 7.8* 8.7* 9.0 9.2  MG 1.1* 1.9 1.9  --  1.8   Liver Function Tests: Recent Labs  Lab 05/31/22 1721  AST 11*  ALT 10  ALKPHOS 92  BILITOT 0.9  PROT 4.1*  ALBUMIN 1.7*   Recent Labs  Lab 05/31/22 1721  LIPASE 18    CBC: Recent Labs  Lab 05/31/22 1721 06/01/22 0331 06/02/22 0612 06/03/22 0446  WBC 11.7* 7.6 7.0 5.8  NEUTROABS 9.8*  --   --   --   HGB 11.2* 9.4* 9.8* 11.0*  HCT 38.9 31.2* 32.1* 37.7  MCV 115.4* 111.0* 108.8* 111.9*  PLT 269 232 254 285     IMAGING STUDIES DG Chest Port 1 View  Result Date: 06/01/2022 CLINICAL DATA:  Cough.  Congestion. EXAM: PORTABLE CHEST 1 VIEW COMPARISON:  Chest radiograph 02/04/2022 FINDINGS: Spinal nerve stimulator leads project over the midthoracic spine. Partially visualized lumbar spinal fusion hardware in place. Small bilateral pleural effusions, new from prior. No pneumothorax. Unchanged cardiac and mediastinal contours. No new focal airspace opacity. There is persistent linear reticular opacities at the right lung apex, favored to represent scarring, unchanged from prior exam. Visualized upper abdomen is notable for gas distended loops of small and large bowel. No radiographically apparent displaced rib fractures. IMPRESSION: 1. Small bilateral pleural effusions, new from prior. 2. No new focal airspace opacity. Consolidative opacity seen on recent prior CT abdomen pelvis is not visualized on exam. 3. Visualized upper abdomen is notable for gas distended loops of small and large bowel. Electronically Signed   By: Marin Roberts M.D.   On: 06/01/2022 12:23   CT ABDOMEN PELVIS W CONTRAST  Result Date: 05/31/2022 CLINICAL DATA:  Acute nonlocalized abdominal pain. Nausea, vomiting, and diarrhea for 6 weeks. Smoker. EXAM: CT ABDOMEN AND PELVIS WITH CONTRAST TECHNIQUE:  Multidetector CT imaging of the abdomen and pelvis was performed using the standard protocol following bolus administration of intravenous contrast. RADIATION DOSE REDUCTION: This exam was performed according to the departmental dose-optimization program which includes automated exposure control, adjustment of the mA and/or kV according to patient size and/or use of iterative reconstruction technique. CONTRAST:  176mL OMNIPAQUE IOHEXOL 300 MG/ML  SOLN COMPARISON:  02/04/2022 FINDINGS: Lower chest: Dense consolidation and volume loss in the left lower lung. This is likely consolidative pneumonia but could indicate postobstructive change due to central obstructing lesion. Patchy infiltrates in the remainder of the lung bases. Hepatobiliary: Mild diffuse fatty infiltration of the liver. No focal lesions. Prominent intra and extrahepatic bile duct dilatation is similar to prior study. No obstructing mass or stone is identified. Gallbladder is normal. Pancreas: Diffuse pancreatic ductal dilatation with pancreatic parenchymal atrophy. Calcification in the head of the pancreas. Similar appearance to prior study. Changes are likely due to chronic pancreatitis. An occult obstructing lesion is not entirely excluded and consideration of follow-up elective MRI/MRCP should be considered if not previously performed. Spleen: Normal in size without focal abnormality. Adrenals/Urinary Tract: No adrenal gland nodules. Nephrograms are symmetrical. No hydronephrosis or hydroureter. Multiple bilateral subcentimeter renal cysts. Some of the cysts have increased density, likely hemorrhagic. No change in appearance or size since  prior study. No imaging follow-up is indicated. Bladder is normal. Stomach/Bowel: Stomach, small bowel, and colon are not abnormally distended. Decompression of the colon limits evaluation but there appears to be diffuse wall thickening involving the sigmoid and descending colon which could indicate infectious or  inflammatory colitis. No extraluminal stranding. No loculated collections. Appendix is not identified. Vascular/Lymphatic: Extensive aortoiliac calcification. No aneurysm. No significant lymphadenopathy. Reproductive: Status post hysterectomy. No adnexal masses. Other: No free air or free fluid in the abdomen. Abdominal wall musculature appears intact. Generator pack in the left gluteal region with leads extending superiorly. Musculoskeletal: Degenerative changes in the spine. Postoperative fixation of the lower lumbosacral spine. IMPRESSION: 1. No evidence of bowel obstruction. Suggestion of wall thickening in the descending and rectosigmoid colon possibly indicating colitis. Consider infectious or inflammatory etiologies. 2. Stable appearance of intra and extrahepatic bile duct dilatation and pancreatic ductal dilatation. Pancreatic parenchymal atrophy and calcification in the head of the pancreas. Changes are likely due to chronic pancreatitis although an occult obstructing lesion is not entirely excluded. If not done previously, consider elective follow-up with MRCP. 3. Prominent aortic atherosclerosis. 4. Dense consolidation in the left lower lung with patchy infiltrates in both lower lungs. Changes probably represent pneumonia but a centrally obstructing lesion would not be excluded. Suggest completion CT of the chest for further evaluation. Electronically Signed   By: Lucienne Capers M.D.   On: 05/31/2022 19:54    DISCHARGE EXAMINATION: Vitals:   06/05/22 1659 06/05/22 1919 06/06/22 0300 06/06/22 0815  BP: 105/70 110/75 129/81 (!) 142/75  Pulse: 100 100 87 95  Resp: 20 20    Temp: 98 F (36.7 C) 98 F (36.7 C) 98.1 F (36.7 C)   TempSrc: Oral Oral Oral   SpO2: 100% 99% 99% 100%  Weight:      Height:       General appearance: Awake alert.  In no distress Resp: Clear to auscultation bilaterally.  Normal effort Cardio: S1-S2 is normal regular.  No S3-S4.  No rubs murmurs or bruit GI: Abdomen  is soft.  Nontender nondistended.  Bowel sounds are present normal.  No masses organomegaly   DISPOSITION: Home  Discharge Instructions     Call MD for:  difficulty breathing, headache or visual disturbances   Complete by: As directed    Call MD for:  extreme fatigue   Complete by: As directed    Call MD for:  persistant dizziness or light-headedness   Complete by: As directed    Call MD for:  persistant nausea and vomiting   Complete by: As directed    Call MD for:  severe uncontrolled pain   Complete by: As directed    Call MD for:  temperature >100.4   Complete by: As directed    Diet general   Complete by: As directed    Discharge instructions   Complete by: As directed    Please take your medications as prescribed.  Please be sure to follow-up with your gastroenterologist.  Seek attention if your diarrhea worsens.  Seek attention if your shortness of breath worsens.  You were cared for by a hospitalist during your hospital stay. If you have any questions about your discharge medications or the care you received while you were in the hospital after you are discharged, you can call the unit and asked to speak with the hospitalist on call if the hospitalist that took care of you is not available. Once you are discharged, your primary care physician will  handle any further medical issues. Please note that NO REFILLS for any discharge medications will be authorized once you are discharged, as it is imperative that you return to your primary care physician (or establish a relationship with a primary care physician if you do not have one) for your aftercare needs so that they can reassess your need for medications and monitor your lab values. If you do not have a primary care physician, you can call [phone] for a physician referral.   Increase activity slowly   Complete by: As directed           Allergies as of 06/06/2022       Reactions   Lyrica [pregabalin] Other (See Comments)    EXTREME SHAKING/TREMBLING   Darvon [propoxyphene]    UNSPECIFIED REACTION    Gabapentin    Extreme shaking and trembling    Augmentin [amoxicillin-pot Clavulanate] Nausea And Vomiting   Has patient had a PCN reaction causing immediate rash, facial/tongue/throat swelling, SOB or lightheadedness with hypotension:no Has patient had a PCN reaction causing severe rash involving mucus membranes or skin necrosis:No Has patient had a PCN reaction that required hospitalization:No Has patient had a PCN reaction occurring within the last 10 years:Yes--ONLY N/V If all of the above answers are "NO", then may proceed with Cephalosporin use.   Erythromycin Itching, Rash      Vibramycin [doxycycline Calcium] Itching, Rash   "[place] "        Medication List     STOP taking these medications    bisoprolol 5 MG tablet Commonly known as: ZEBETA   Breztri Aerosphere 160-9-4.8 MCG/ACT Aero Generic drug: Budeson-Glycopyrrol-Formoterol       TAKE these medications    acetaminophen 500 MG tablet Commonly known as: TYLENOL Take 1,000 mg by mouth every 6 (six) hours as needed for headache.   albuterol 108 (90 Base) MCG/ACT inhaler Commonly known as: VENTOLIN HFA 2puffs up to every 4 hours if can't catch your breath   amLODipine 10 MG tablet Commonly known as: NORVASC Take 1 tablet (10 mg total) by mouth daily.   budesonide 0.25 MG/2ML nebulizer solution Commonly known as: Pulmicort Take 2 mLs (0.25 mg total) by nebulization in the morning and at bedtime.   cefdinir 300 MG capsule Commonly known as: OMNICEF Take 1 capsule (300 mg total) by mouth every 12 (twelve) hours for 2 days.   dextromethorphan 30 MG/5ML liquid Commonly known as: DELSYM Take 30 mg by mouth as needed for cough.   diclofenac Sodium 1 % Gel Commonly known as: VOLTAREN Apply 2 g topically 4 (four) times daily.   diphenoxylate-atropine 2.5-0.025 MG tablet Commonly known as: LOMOTIL Take 1 tablet by mouth 3  (three) times daily as needed for diarrhea or loose stools.   Ensure High Protein Liqd Drink 1 can 3 times daily.   folic acid 1 MG tablet Commonly known as: FOLVITE Take 1 tablet (1 mg total) by mouth daily. Start taking on: June 07, 2022   formoterol 20 MCG/2ML nebulizer solution Commonly known as: PERFOROMIST Take 2 mLs (20 mcg total) by nebulization 2 (two) times daily. Use in nebulizer twice daily perfectly regularly   magic mouthwash Soln Take 5 mLs by mouth 3 (three) times daily as needed for mouth pain.   metoCLOPramide 5 MG tablet Commonly known as: Reglan Take 1 tablet (5 mg total) by mouth every 6 (six) hours as needed for nausea.   mirtazapine 15 MG tablet Commonly known as: REMERON Take 15 mg by  mouth at bedtime.   Mucinex Maximum Strength 1200 MG Tb12 Generic drug: Guaifenesin Take 1 tablet by mouth 2 (two) times daily.   omeprazole 40 MG capsule Commonly known as: PRILOSEC TAKE 1 CAPSULE BY MOUTH 1 TO 2 TIMES DAILY BEFORE A MEAL What changed: See the new instructions.   oxybutynin 5 MG 24 hr tablet Commonly known as: DITROPAN-XL Take 5 mg by mouth daily.   oxyCODONE-acetaminophen 10-325 MG tablet Commonly known as: PERCOCET Take 1 tablet by mouth 5 (five) times daily.   OxyCONTIN 15 mg 12 hr tablet Generic drug: oxyCODONE Take 15 mg by mouth every 12 (twelve) hours.   OXYGEN Inhale 6-10 L into the lungs See admin instructions. continuous   Pancrelipase (Lip-Prot-Amyl) 24000-76000 units Cpep Take 2 capsules (48,000 Units total) by mouth See admin instructions. Take 2 caps daily with meals and 1 cap with snacks   predniSONE 20 MG tablet Commonly known as: DELTASONE Tablets once daily for 3 days followed by 1 tablet once daily for 3 days and then stop Start taking on: June 07, 2022 What changed:  medication strength See the new instructions.   prochlorperazine 25 MG suppository Commonly known as: COMPAZINE Place 1 suppository (25 mg total)  rectally every 12 (twelve) hours as needed for nausea or vomiting.   saccharomyces boulardii 250 MG capsule Commonly known as: FLORASTOR Take 1 capsule (250 mg total) by mouth 2 (two) times daily for 14 days.   tiZANidine 4 MG capsule Commonly known as: ZANAFLEX Take 1 capsule (4 mg total) by mouth 3 (three) times daily as needed for muscle spasms.   traZODone 150 MG tablet Commonly known as: DESYREL Take 2 tablets (300 mg total) by mouth at bedtime.   Yupelri 175 MCG/3ML nebulizer solution Generic drug: revefenacin Inhale 3 mLs (175 mcg total) into the lungs daily. What changed: additional instructions          Follow-up [place], [facility] Follow up.   Why: PT will call to schedule your first home visit. Contact information: [address] [place] [address] [phone]                 TOTAL DISCHARGE TIME: 35 minutes  Kent Riendeau [employer] on [email or web address]  06/06/2022, 11:20 AM

## 2022-06-06 NOTE — Care Management Important Message (Signed)
Important Message  Patient Details  Name: Jasmine [name] MRN: [id number] Date of Birth: 09-29-1955   Medicare Important Message Given:  Yes     [name] 06/06/2022, 1:48 PM

## 2022-06-06 NOTE — TOC Transition Note (Signed)
Transition of Care [facility]) - CM/SW Discharge Note   Patient Details  Name: Jasmine [name] MRN: [id number] Date of Birth: 05/10/1955  Transition of Care [facility]) CM/SW Contact:  Boneta Lucks, RN Phone Number: 06/06/2022, 9:38 AM   Clinical Narrative:   Patient discharging home. Home health orders placed. TOC updated Jason with [facility]. Added to AVS.   Final next level of care: Home w Home Health Services Barriers to Discharge: Barriers Resolved   Patient Goals and CMS Choice CMS Medicare.gov Compare Post Acute Care list provided to:: [name] (must comment) Choice offered to / list presented to : Spouse  Discharge Placement                      Patient and family notified of of transfer: 06/06/22  Discharge Plan and Services Additional resources added to the After Visit Summary for                   DME Agency: [employer]       HH Arranged: PT [place] Agency: [name] ([place]) Date HH Agency Contacted: 06/01/22 Time [place]: [place] Representative spoke with at [place]: [place] Determinants of Health ([place]) Interventions SDOH Screenings   Food Insecurity: No Food Insecurity (04/10/2022)  Housing: Low Risk  (04/10/2022)  Transportation Needs: No Transportation Needs (04/10/2022)  Utilities: Not At Risk (04/10/2022)  Alcohol Screen: Low Risk  (04/10/2022)  Depression (PHQ2-9): Low Risk  (04/10/2022)  Financial Resource Strain: Low Risk  (04/10/2022)  Physical Activity: Inactive (04/10/2022)  Social Connections: Moderately Integrated (04/10/2022)  Stress: No Stress Concern Present (04/10/2022)  Tobacco Use: High Risk (05/31/2022)    Readmission Risk Interventions    09/28/2021   11:35 AM 08/05/2021   11:27 AM 07/01/2021    1:45 PM  Readmission Risk Prevention Plan  Transportation Screening Complete Complete Complete  Medication Review Press photographer) Complete Complete   PCP or Specialist appointment within 3-5 days of discharge    Not Complete  HRI or Home Care Consult Patient refused Patient refused Complete  SW Recovery Care/Counseling Consult Complete Complete Complete  Palliative Care Screening Not Applicable Not Applicable Not [place] Not Applicable Not Applicable Not Applicable

## 2022-06-07 ENCOUNTER — Telehealth: Payer: Self-pay

## 2022-06-07 NOTE — Transitions of Care (Post Inpatient/ED Visit) (Signed)
   06/07/2022  Name: Jasmine [name] MRN: [id number] DOB: 10-Jan-1956  Today's TOC FU Call Status: Today's TOC FU Call Status:: Successful TOC FU Call Competed TOC FU Call Complete Date: 06/07/22  Transition Care Management Follow-up Telephone Call Date of Discharge: 06/06/22 [facility] Type of Discharge: Inpatient Admission Primary Inpatient Discharge Diagnosis:: gastroenteritis How have you been since you were released from the hospital?: Better Any questions or concerns?: No  Items Reviewed: Did you receive and understand the discharge instructions provided?: Yes Medications obtained and verified?: Yes (Medications Reviewed) Any new allergies since your discharge?: No Dietary orders reviewed?: Yes Do you have support at home?: Yes People in Home: spouse  Home Care and Equipment/Supplies: [place] Ordered?: Yes Name of [place]la:: [place] set up a time to come to your home?: No Any new equipment or medical supplies ordered?: NA  Functional Questionnaire: Do you need assistance with bathing/showering or dressing?: No Do you need assistance with meal preparation?: No Do you need assistance with eating?: No Do you have difficulty maintaining continence: No Do you need assistance with getting out of bed/getting out of a chair/moving?: No Do you have difficulty managing or taking your medications?: No  Follow up appointments reviewed: PCP Follow-up appointment confirmed?: No (patient declined appt) MD Provider Line Number:[phone] Given: No [facility] Follow-up appointment confirmed?: Yes Date of Specialist follow-up appointment?: 06/14/22 Follow-Up Specialty Provider:: Dr Dorene Grebe GI Do you need transportation to your follow-up appointment?: No Do you understand care options if your condition(s) worsen?: Yes-patient verbalized understanding    [place], [place] Nurse Health  Advisor Direct Dial [phone]

## 2022-06-08 LAB — VITAMIN C: Vitamin C: 0.3 mg/dL — ABNORMAL LOW (ref 0.4–2.0)

## 2022-06-12 ENCOUNTER — Inpatient Hospital Stay (HOSPITAL_COMMUNITY)
Admission: EM | Admit: 2022-06-12 | Discharge: 2022-07-05 | DRG: 208 | Disposition: E | Payer: Medicare Other | Attending: Pulmonary Disease | Admitting: Pulmonary Disease

## 2022-06-12 ENCOUNTER — Other Ambulatory Visit: Payer: Self-pay

## 2022-06-12 ENCOUNTER — Emergency Department (HOSPITAL_COMMUNITY): Payer: Medicare Other

## 2022-06-12 DIAGNOSIS — R4589 Other symptoms and signs involving emotional state: Secondary | ICD-10-CM | POA: Diagnosis not present

## 2022-06-12 DIAGNOSIS — Z515 Encounter for palliative care: Secondary | ICD-10-CM | POA: Diagnosis not present

## 2022-06-12 DIAGNOSIS — Z981 Arthrodesis status: Secondary | ICD-10-CM

## 2022-06-12 DIAGNOSIS — D638 Anemia in other chronic diseases classified elsewhere: Secondary | ICD-10-CM | POA: Diagnosis present

## 2022-06-12 DIAGNOSIS — Z9682 Presence of neurostimulator: Secondary | ICD-10-CM

## 2022-06-12 DIAGNOSIS — Z9981 Dependence on supplemental oxygen: Secondary | ICD-10-CM

## 2022-06-12 DIAGNOSIS — J440 Chronic obstructive pulmonary disease with acute lower respiratory infection: Secondary | ICD-10-CM | POA: Diagnosis present

## 2022-06-12 DIAGNOSIS — J441 Chronic obstructive pulmonary disease with (acute) exacerbation: Principal | ICD-10-CM | POA: Diagnosis present

## 2022-06-12 DIAGNOSIS — F1120 Opioid dependence, uncomplicated: Secondary | ICD-10-CM | POA: Diagnosis present

## 2022-06-12 DIAGNOSIS — Z79899 Other long term (current) drug therapy: Secondary | ICD-10-CM

## 2022-06-12 DIAGNOSIS — Z66 Do not resuscitate: Secondary | ICD-10-CM | POA: Diagnosis present

## 2022-06-12 DIAGNOSIS — F17210 Nicotine dependence, cigarettes, uncomplicated: Secondary | ICD-10-CM | POA: Diagnosis present

## 2022-06-12 DIAGNOSIS — E43 Unspecified severe protein-calorie malnutrition: Secondary | ICD-10-CM | POA: Diagnosis present

## 2022-06-12 DIAGNOSIS — Z85118 Personal history of other malignant neoplasm of bronchus and lung: Secondary | ICD-10-CM

## 2022-06-12 DIAGNOSIS — F419 Anxiety disorder, unspecified: Secondary | ICD-10-CM | POA: Diagnosis present

## 2022-06-12 DIAGNOSIS — E874 Mixed disorder of acid-base balance: Secondary | ICD-10-CM | POA: Diagnosis present

## 2022-06-12 DIAGNOSIS — E861 Hypovolemia: Secondary | ICD-10-CM | POA: Diagnosis present

## 2022-06-12 DIAGNOSIS — Z7189 Other specified counseling: Secondary | ICD-10-CM | POA: Diagnosis not present

## 2022-06-12 DIAGNOSIS — G9341 Metabolic encephalopathy: Secondary | ICD-10-CM | POA: Diagnosis present

## 2022-06-12 DIAGNOSIS — D539 Nutritional anemia, unspecified: Secondary | ICD-10-CM | POA: Diagnosis present

## 2022-06-12 DIAGNOSIS — G894 Chronic pain syndrome: Secondary | ICD-10-CM | POA: Diagnosis present

## 2022-06-12 DIAGNOSIS — K861 Other chronic pancreatitis: Secondary | ICD-10-CM | POA: Diagnosis present

## 2022-06-12 DIAGNOSIS — M6250 Muscle wasting and atrophy, not elsewhere classified, unspecified site: Secondary | ICD-10-CM | POA: Diagnosis present

## 2022-06-12 DIAGNOSIS — T4275XA Adverse effect of unspecified antiepileptic and sedative-hypnotic drugs, initial encounter: Secondary | ICD-10-CM | POA: Diagnosis not present

## 2022-06-12 DIAGNOSIS — K760 Fatty (change of) liver, not elsewhere classified: Secondary | ICD-10-CM | POA: Diagnosis present

## 2022-06-12 DIAGNOSIS — R54 Age-related physical debility: Secondary | ICD-10-CM | POA: Diagnosis present

## 2022-06-12 DIAGNOSIS — Z818 Family history of other mental and behavioral disorders: Secondary | ICD-10-CM

## 2022-06-12 DIAGNOSIS — Z7951 Long term (current) use of inhaled steroids: Secondary | ICD-10-CM

## 2022-06-12 DIAGNOSIS — G629 Polyneuropathy, unspecified: Secondary | ICD-10-CM | POA: Diagnosis present

## 2022-06-12 DIAGNOSIS — R739 Hyperglycemia, unspecified: Secondary | ICD-10-CM | POA: Diagnosis not present

## 2022-06-12 DIAGNOSIS — R64 Cachexia: Secondary | ICD-10-CM | POA: Diagnosis present

## 2022-06-12 DIAGNOSIS — J471 Bronchiectasis with (acute) exacerbation: Secondary | ICD-10-CM | POA: Diagnosis present

## 2022-06-12 DIAGNOSIS — Z881 Allergy status to other antibiotic agents status: Secondary | ICD-10-CM

## 2022-06-12 DIAGNOSIS — E785 Hyperlipidemia, unspecified: Secondary | ICD-10-CM | POA: Diagnosis present

## 2022-06-12 DIAGNOSIS — Z681 Body mass index (BMI) 19 or less, adult: Secondary | ICD-10-CM | POA: Diagnosis not present

## 2022-06-12 DIAGNOSIS — K529 Noninfective gastroenteritis and colitis, unspecified: Secondary | ICD-10-CM | POA: Diagnosis present

## 2022-06-12 DIAGNOSIS — J9621 Acute and chronic respiratory failure with hypoxia: Secondary | ICD-10-CM | POA: Diagnosis present

## 2022-06-12 DIAGNOSIS — Z833 Family history of diabetes mellitus: Secondary | ICD-10-CM

## 2022-06-12 DIAGNOSIS — J470 Bronchiectasis with acute lower respiratory infection: Secondary | ICD-10-CM | POA: Diagnosis present

## 2022-06-12 DIAGNOSIS — L89152 Pressure ulcer of sacral region, stage 2: Secondary | ICD-10-CM | POA: Diagnosis present

## 2022-06-12 DIAGNOSIS — Z8249 Family history of ischemic heart disease and other diseases of the circulatory system: Secondary | ICD-10-CM

## 2022-06-12 DIAGNOSIS — I10 Essential (primary) hypertension: Secondary | ICD-10-CM | POA: Diagnosis present

## 2022-06-12 DIAGNOSIS — J9622 Acute and chronic respiratory failure with hypercapnia: Secondary | ICD-10-CM | POA: Diagnosis present

## 2022-06-12 DIAGNOSIS — Z9911 Dependence on respirator [ventilator] status: Secondary | ICD-10-CM

## 2022-06-12 DIAGNOSIS — T380X5A Adverse effect of glucocorticoids and synthetic analogues, initial encounter: Secondary | ICD-10-CM | POA: Diagnosis not present

## 2022-06-12 DIAGNOSIS — J189 Pneumonia, unspecified organism: Secondary | ICD-10-CM | POA: Diagnosis present

## 2022-06-12 DIAGNOSIS — Z825 Family history of asthma and other chronic lower respiratory diseases: Secondary | ICD-10-CM

## 2022-06-12 DIAGNOSIS — Z888 Allergy status to other drugs, medicaments and biological substances status: Secondary | ICD-10-CM

## 2022-06-12 DIAGNOSIS — K219 Gastro-esophageal reflux disease without esophagitis: Secondary | ICD-10-CM | POA: Diagnosis present

## 2022-06-12 DIAGNOSIS — M5430 Sciatica, unspecified side: Secondary | ICD-10-CM | POA: Diagnosis present

## 2022-06-12 DIAGNOSIS — Z902 Acquired absence of lung [part of]: Secondary | ICD-10-CM

## 2022-06-12 DIAGNOSIS — Z83438 Family history of other disorder of lipoprotein metabolism and other lipidemia: Secondary | ICD-10-CM

## 2022-06-12 DIAGNOSIS — I952 Hypotension due to drugs: Secondary | ICD-10-CM | POA: Diagnosis not present

## 2022-06-12 DIAGNOSIS — Z87442 Personal history of urinary calculi: Secondary | ICD-10-CM

## 2022-06-12 DIAGNOSIS — Z8719 Personal history of other diseases of the digestive system: Secondary | ICD-10-CM

## 2022-06-12 LAB — BLOOD GAS, VENOUS
Acid-Base Excess: 10 mmol/L — ABNORMAL HIGH (ref 0.0–2.0)
Bicarbonate: 39 mmol/L — ABNORMAL HIGH (ref 20.0–28.0)
Drawn by: 61190
O2 Saturation: 80.9 %
Patient temperature: 36.4
pCO2, Ven: 72 mmHg (ref 44–60)
pH, Ven: 7.34 (ref 7.25–7.43)
pO2, Ven: 45 mmHg (ref 32–45)

## 2022-06-12 LAB — BRAIN NATRIURETIC PEPTIDE: B Natriuretic Peptide: 68 pg/mL (ref 0.0–100.0)

## 2022-06-12 LAB — CBC WITH DIFFERENTIAL/PLATELET
Abs Immature Granulocytes: 0.34 10*3/uL — ABNORMAL HIGH (ref 0.00–0.07)
Basophils Absolute: 0.1 10*3/uL (ref 0.0–0.1)
Basophils Relative: 1 %
Eosinophils Absolute: 0.1 10*3/uL (ref 0.0–0.5)
Eosinophils Relative: 1 %
HCT: 31.4 % — ABNORMAL LOW (ref 36.0–46.0)
Hemoglobin: 8.9 g/dL — ABNORMAL LOW (ref 12.0–15.0)
Immature Granulocytes: 3 %
Lymphocytes Relative: 15 %
Lymphs Abs: 1.5 10*3/uL (ref 0.7–4.0)
MCH: 32.8 pg (ref 26.0–34.0)
MCHC: 28.3 g/dL — ABNORMAL LOW (ref 30.0–36.0)
MCV: 115.9 fL — ABNORMAL HIGH (ref 80.0–100.0)
Monocytes Absolute: 0.8 10*3/uL (ref 0.1–1.0)
Monocytes Relative: 8 %
Neutro Abs: 7.3 10*3/uL (ref 1.7–7.7)
Neutrophils Relative %: 72 %
Platelets: 323 10*3/uL (ref 150–400)
RBC: 2.71 MIL/uL — ABNORMAL LOW (ref 3.87–5.11)
RDW: 14.2 % (ref 11.5–15.5)
WBC: 10 10*3/uL (ref 4.0–10.5)
nRBC: 0 % (ref 0.0–0.2)

## 2022-06-12 LAB — BASIC METABOLIC PANEL
Anion gap: 6 (ref 5–15)
BUN: 12 mg/dL (ref 8–23)
CO2: 35 mmol/L — ABNORMAL HIGH (ref 22–32)
Calcium: 8.5 mg/dL — ABNORMAL LOW (ref 8.9–10.3)
Chloride: 94 mmol/L — ABNORMAL LOW (ref 98–111)
Creatinine, Ser: 0.53 mg/dL (ref 0.44–1.00)
GFR, Estimated: >60 mL/min (ref >60)
Glucose, Bld: 129 mg/dL — ABNORMAL HIGH (ref 70–99)
Potassium: 4.3 mmol/L (ref 3.5–5.1)
Sodium: 135 mmol/L (ref 135–145)

## 2022-06-12 LAB — MRSA NEXT GEN BY PCR, NASAL: MRSA by PCR Next Gen: NOT DETECTED

## 2022-06-12 LAB — TROPONIN I (HIGH SENSITIVITY)
Troponin I (High Sensitivity): 13 ng/L (ref <18)
Troponin I (High Sensitivity): 9 ng/L (ref <18)

## 2022-06-12 MED ORDER — OXYCODONE-ACETAMINOPHEN 5-325 MG PO TABS
1.0000 | ORAL_TABLET | Freq: Every day | ORAL | Status: DC | PRN
  Administered 2022-06-12 – 2022-06-13 (×5): 1 via ORAL
  Filled 2022-06-12 (×5): qty 1

## 2022-06-12 MED ORDER — SODIUM CHLORIDE 0.9% FLUSH: 3.0000 mL | INTRAVENOUS | Status: DC | PRN

## 2022-06-12 MED ORDER — POLYETHYLENE GLYCOL 3350 17 G PO PACK: 17.0000 g | PACK | Freq: Every day | ORAL | Status: DC | PRN

## 2022-06-12 MED ORDER — OXYCODONE HCL ER 15 MG PO T12A
15.0000 mg | EXTENDED_RELEASE_TABLET | Freq: Two times a day (BID) | ORAL | Status: DC
  Administered 2022-06-13 (×2): 15 mg via ORAL
  Filled 2022-06-12 (×3): qty 1

## 2022-06-12 MED ORDER — OXYCODONE HCL 5 MG PO TABS
5.0000 mg | ORAL_TABLET | Freq: Every day | ORAL | Status: DC | PRN
  Administered 2022-06-12 – 2022-06-13 (×5): 5 mg via ORAL
  Filled 2022-06-12 (×5): qty 1

## 2022-06-12 MED ORDER — FOLIC ACID 1 MG PO TABS
1.0000 mg | ORAL_TABLET | Freq: Every day | ORAL | Status: DC
  Administered 2022-06-13: 1 mg via ORAL
  Filled 2022-06-12: qty 1

## 2022-06-12 MED ORDER — TIZANIDINE HCL 2 MG PO TABS: 4.0000 mg | ORAL_TABLET | Freq: Three times a day (TID) | ORAL | Status: DC

## 2022-06-12 MED ORDER — ALBUTEROL SULFATE (2.5 MG/3ML) 0.083% IN NEBU: 2.5000 mg | INHALATION_SOLUTION | RESPIRATORY_TRACT | Status: DC | PRN

## 2022-06-12 MED ORDER — PROCHLORPERAZINE 25 MG RE SUPP: 25.0000 mg | Freq: Two times a day (BID) | RECTAL | Status: DC | PRN

## 2022-06-12 MED ORDER — ONDANSETRON HCL 4 MG/2ML IJ SOLN
INTRAMUSCULAR | Status: AC
  Filled 2022-06-12: qty 2

## 2022-06-12 MED ORDER — HYDROXYZINE HCL 50 MG/ML IM SOLN
50.0000 mg | Freq: Once | INTRAMUSCULAR | Status: AC
  Administered 2022-06-12: 50 mg via INTRAMUSCULAR
  Filled 2022-06-12: qty 1

## 2022-06-12 MED ORDER — CHLORHEXIDINE GLUCONATE CLOTH 2 % EX PADS
6.0000 | MEDICATED_PAD | Freq: Every day | CUTANEOUS | Status: DC
  Administered 2022-06-12: 6 via TOPICAL

## 2022-06-12 MED ORDER — SODIUM CHLORIDE 0.9 % IV BOLUS
1000.0000 mL | Freq: Once | INTRAVENOUS | Status: AC
  Administered 2022-06-12: 1000 mL via INTRAVENOUS

## 2022-06-12 MED ORDER — ALBUTEROL SULFATE (2.5 MG/3ML) 0.083% IN NEBU
10.0000 mg/h | INHALATION_SOLUTION | Freq: Once | RESPIRATORY_TRACT | Status: DC
  Filled 2022-06-12: qty 3

## 2022-06-12 MED ORDER — ONDANSETRON HCL 4 MG/2ML IJ SOLN: 4.0000 mg | Freq: Four times a day (QID) | INTRAMUSCULAR | Status: DC | PRN

## 2022-06-12 MED ORDER — IPRATROPIUM-ALBUTEROL 0.5-2.5 (3) MG/3ML IN SOLN
3.0000 mL | Freq: Four times a day (QID) | RESPIRATORY_TRACT | Status: DC
  Administered 2022-06-12 – 2022-06-14 (×7): 3 mL via RESPIRATORY_TRACT
  Filled 2022-06-12 (×7): qty 3

## 2022-06-12 MED ORDER — ONDANSETRON HCL 4 MG PO TABS: 4.0000 mg | ORAL_TABLET | Freq: Four times a day (QID) | ORAL | Status: DC | PRN

## 2022-06-12 MED ORDER — TRAZODONE HCL 50 MG PO TABS: 50.0000 mg | ORAL_TABLET | Freq: Every evening | ORAL | Status: DC | PRN

## 2022-06-12 MED ORDER — METHYLPREDNISOLONE SODIUM SUCC 40 MG IJ SOLR
40.0000 mg | Freq: Two times a day (BID) | INTRAMUSCULAR | Status: DC
  Administered 2022-06-12 – 2022-06-16 (×8): 40 mg via INTRAVENOUS
  Filled 2022-06-12 (×8): qty 1

## 2022-06-12 MED ORDER — OXYBUTYNIN CHLORIDE ER 5 MG PO TB24
5.0000 mg | ORAL_TABLET | Freq: Every day | ORAL | Status: DC
  Administered 2022-06-13: 5 mg via ORAL
  Filled 2022-06-12: qty 1

## 2022-06-12 MED ORDER — PANCRELIPASE (LIP-PROT-AMYL) 12000-38000 UNITS PO CPEP: 24000.0000 [IU] | ORAL_CAPSULE | Freq: Three times a day (TID) | ORAL | Status: DC

## 2022-06-12 MED ORDER — TRAZODONE HCL 50 MG PO TABS
300.0000 mg | ORAL_TABLET | Freq: Every day | ORAL | Status: DC
  Administered 2022-06-12: 300 mg via ORAL
  Filled 2022-06-12: qty 6

## 2022-06-12 MED ORDER — BISACODYL 10 MG RE SUPP: 10.0000 mg | Freq: Every day | RECTAL | Status: DC | PRN

## 2022-06-12 MED ORDER — IPRATROPIUM-ALBUTEROL 0.5-2.5 (3) MG/3ML IN SOLN
3.0000 mL | Freq: Once | RESPIRATORY_TRACT | Status: AC
  Administered 2022-06-12: 3 mL via RESPIRATORY_TRACT
  Filled 2022-06-12: qty 3

## 2022-06-12 MED ORDER — DIPHENOXYLATE-ATROPINE 2.5-0.025 MG PO TABS: 1.0000 | ORAL_TABLET | Freq: Three times a day (TID) | ORAL | Status: DC | PRN

## 2022-06-12 MED ORDER — ONDANSETRON HCL 4 MG/2ML IJ SOLN
4.0000 mg | Freq: Once | INTRAMUSCULAR | Status: AC
  Administered 2022-06-12: 4 mg via INTRAVENOUS
  Filled 2022-06-12: qty 2

## 2022-06-12 MED ORDER — DM-GUAIFENESIN ER 30-600 MG PO TB12
1.0000 | ORAL_TABLET | Freq: Two times a day (BID) | ORAL | Status: DC
  Administered 2022-06-12 – 2022-06-13 (×3): 1 via ORAL
  Filled 2022-06-12 (×3): qty 1

## 2022-06-12 MED ORDER — SODIUM CHLORIDE 0.9% FLUSH
3.0000 mL | Freq: Two times a day (BID) | INTRAVENOUS | Status: DC
  Administered 2022-06-12 – 2022-06-15 (×6): 3 mL via INTRAVENOUS

## 2022-06-12 MED ORDER — SODIUM CHLORIDE 0.9% FLUSH
3.0000 mL | Freq: Two times a day (BID) | INTRAVENOUS | Status: DC
  Administered 2022-06-12 – 2022-06-16 (×8): 3 mL via INTRAVENOUS

## 2022-06-12 MED ORDER — ACETAMINOPHEN 650 MG RE SUPP: 650.0000 mg | Freq: Four times a day (QID) | RECTAL | Status: DC | PRN

## 2022-06-12 MED ORDER — SODIUM CHLORIDE 0.9 % IV SOLN: INTRAVENOUS | Status: DC | PRN

## 2022-06-12 MED ORDER — OXYCODONE-ACETAMINOPHEN 5-325 MG PO TABS
1.0000 | ORAL_TABLET | Freq: Once | ORAL | Status: AC
  Administered 2022-06-12: 1 via ORAL
  Filled 2022-06-12: qty 1

## 2022-06-12 MED ORDER — MIRTAZAPINE 15 MG PO TABS
15.0000 mg | ORAL_TABLET | Freq: Every day | ORAL | Status: DC
  Administered 2022-06-12 – 2022-06-13 (×2): 15 mg via ORAL
  Filled 2022-06-12 (×2): qty 1

## 2022-06-12 MED ORDER — MAGNESIUM SULFATE 2 GM/50ML IV SOLN
2.0000 g | Freq: Once | INTRAVENOUS | Status: AC
  Administered 2022-06-12: 2 g via INTRAVENOUS
  Filled 2022-06-12: qty 50

## 2022-06-12 MED ORDER — AMLODIPINE BESYLATE 5 MG PO TABS
10.0000 mg | ORAL_TABLET | Freq: Every day | ORAL | Status: DC
  Administered 2022-06-13: 10 mg via ORAL
  Filled 2022-06-12: qty 2

## 2022-06-12 MED ORDER — HEPARIN SODIUM (PORCINE) 5000 UNIT/ML IJ SOLN
5000.0000 [IU] | Freq: Three times a day (TID) | INTRAMUSCULAR | Status: DC
  Administered 2022-06-12 – 2022-06-14 (×5): 5000 [IU] via SUBCUTANEOUS
  Filled 2022-06-12 (×6): qty 1

## 2022-06-12 MED ORDER — ACETAMINOPHEN 325 MG PO TABS
650.0000 mg | ORAL_TABLET | Freq: Four times a day (QID) | ORAL | Status: DC | PRN
  Administered 2022-06-12: 650 mg via ORAL
  Filled 2022-06-12 (×2): qty 2

## 2022-06-12 MED ORDER — PANTOPRAZOLE SODIUM 40 MG PO TBEC
40.0000 mg | DELAYED_RELEASE_TABLET | Freq: Every day | ORAL | Status: DC
  Administered 2022-06-13: 40 mg via ORAL
  Filled 2022-06-12: qty 1

## 2022-06-12 MED ORDER — TIZANIDINE HCL 2 MG PO TABS
4.0000 mg | ORAL_TABLET | Freq: Three times a day (TID) | ORAL | Status: DC
  Administered 2022-06-12 – 2022-06-13 (×2): 4 mg via ORAL
  Filled 2022-06-12 (×2): qty 2

## 2022-06-12 MED ORDER — OXYCODONE-ACETAMINOPHEN 10-325 MG PO TABS: 1.0000 | ORAL_TABLET | Freq: Every day | ORAL | Status: DC

## 2022-06-12 MED ORDER — SACCHAROMYCES BOULARDII 250 MG PO CAPS
250.0000 mg | ORAL_CAPSULE | Freq: Two times a day (BID) | ORAL | Status: DC
  Administered 2022-06-12 – 2022-06-13 (×3): 250 mg via ORAL
  Filled 2022-06-12 (×3): qty 1

## 2022-06-12 MED ORDER — ALBUTEROL SULFATE (2.5 MG/3ML) 0.083% IN NEBU
2.5000 mg | INHALATION_SOLUTION | Freq: Once | RESPIRATORY_TRACT | Status: AC
  Administered 2022-06-12: 2.5 mg via RESPIRATORY_TRACT
  Filled 2022-06-12: qty 3

## 2022-06-12 MED ORDER — KETOROLAC TROMETHAMINE 30 MG/ML IJ SOLN
30.0000 mg | Freq: Once | INTRAMUSCULAR | Status: AC
  Administered 2022-06-12: 30 mg via INTRAVENOUS
  Filled 2022-06-12: qty 1

## 2022-06-12 NOTE — ED Triage Notes (Addendum)
Pt arrives by [facility] c/o SOB since yesterday. Pt is on 6L [place] chronically, was on 8L [place] upon their arrival. Per EMS they administered 125 of solumedrol and started pt on albuterol treatment. Pt began wheezing and did not want to stay on breathing treatment. EMS placed pt on 15L [place]. Hx of COPD and lobectomy to right.   EDP and respiratory at bedside

## 2022-06-12 NOTE — ED Notes (Signed)
RT in room with pt at this time

## 2022-06-12 NOTE — H&P (Signed)
Patient Demographics:    Jasmine [name], is a 67 y.o. female  MRN: [id number]   DOB - 1955-11-17  Admit Date - 06/30/2022  Outpatient Primary MD for the patient is Raliegh Ip, DO   Assessment & Plan:   Assessment and Plan: Colitis versus diarrhea due to chronic pancreatitis  Diarrhea thought to be due to either chronic pancreatitis or colitis.  Only mild wall thickening was noted on CT scan.  No stool studies were sent.   Patient mentions that she was diagnosed with C. difficile about 6 months ago.  The only positive C. difficile test available in our system is from September 2022. No results in Care Everywhere. Does not appear to be infectious since this has been ongoing for several weeks.  She is afebrile and WBC is normal. She was given Lomotil and probiotics with improvement in her diarrhea. Outpatient follow-up with gastroenterology.   Left lower lobe pneumonia COPD exacerbation.  Dense infiltrate at the left lower lobe. It seems that her respiratory symptoms preceded GI symptoms. 03/28 chest radiograph with hyperinflation with increased lung marking bilaterally with small bilateral pleural effusions.  Patient was initially on IV antibiotics.  Now on oral antibiotics. Patient on prednisone.  Also on nebulizer treatments and inhalers. Respiratory status is stable. Followed by Dr. Sherene Sires. Will be discharged on tapering doses of steroids and a few more days of antibiotics.   Chronic respiratory failure with hypoxia On 6 L of oxygen at home chronically.  Saturations are stable here in the hospital.   Hypokalemia Low K due to GI losses. Improved.   Cigarette smoker Smoking cessation counseling.   Chronic pancreatitis (HCC) Continue pain control and supportive care. Chronic changes on abdominal CT  in regards to her pancreas.  Looks like she has been referred to gastroenterology in [place] for an endoscopic ultrasound considering her chronic pancreatitis. Continue antiacid therapy and pancreatic enzymes.    Squamous cell lung cancer, right (HCC) Follow up as outpatient. Hx of lobectomy on the right upper lobe    Chronic pain syndrome Continue pain control.   Essential hypertension Continue blood pressure control with amlodipine.    Macrocytic anemia Hemoglobin is stable.  No evidence of overt blood loss.  Outpatient monitoring.   Severe protein calorie malnutrition. Nutrition Problem: Severe Malnutrition Etiology: chronic illness (acute       1)1)Acute COPD Exacerbation- no definite pneumonia,  treat empirically with IV Solu-Medrol 40 mg every 8 hours, give mucolytics, antibiotics and bronchodilators as ordered, supplemental oxygen as ordered.  -Patient required BiPAP in the ED   2) acute on chronic hypoxic respiratory failure--- at baseline patient uses 6 L of oxygen via nasal cannula -Currently requiring BiPAP on and off  3) tobacco abuse----patient is not ready to quit smoking nicotine patch as ordered  4) chronic diarrhea due to chronic pancreatitis--- avoid dehydration, outpatient follow-up with GI as previously advised   Disposition/Need for in-Hospital Stay- patient unable to  be discharged at this time due to ****  Status is: Inpatient  {Inpatient:23812}  Dispo: The patient is from: {From:23814}              Anticipated d/c is to: {To:23815}              Anticipated d/c date is: {Days:23816}              Patient currently {Medically stable:23817} Barriers: Not Clinically Stable- ***   With History of - Reviewed by me  Past Medical History:  Diagnosis Date   Allergy    Anemia    Anxiety    Arthritis    Asthma    Carpal tunnel syndrome    Chronic back pain    Cigarette smoker 08/25/2017   Complication of anesthesia    in the past has had  N/V, the last few surgeries she has not been   COPD (chronic obstructive pulmonary disease) (HCC)    Easy bruising 07/10/2016   GERD (gastroesophageal reflux disease)    Headache    Heart murmur 2005   never had any problems   History of blood transfusion 2018   History of kidney stones    Hypertension    Hypoglycemia    Hypoglycemia    Iron deficiency anemia 07/11/2016   Neuropathy    both arms   Normocytic anemia 07/29/2015   Pancreatitis    Pneumonia    PONV (postoperative nausea and vomiting)    Postoperative anemia due to acute blood loss 11/15/2015   R lung cancer    Sciatica    Seizures (HCC)    as a child when she would have an asthma attack. none as an adult   Shortness of breath dyspnea       Past Surgical History:  Procedure Laterality Date   ABDOMINAL HYSTERECTOMY  2005   APPLICATION OF ROBOTIC ASSISTANCE FOR SPINAL PROCEDURE N/A 09/05/2016   Procedure: APPLICATION OF ROBOTIC ASSISTANCE FOR SPINAL PROCEDURE;  Surgeon: Lisbeth Renshaw, MD;  Location: [facility] OR;  Service: Neurosurgery;  Laterality: N/A;   BACK SURGERY     BIOPSY  06/14/2020   Procedure: BIOPSY;  Surgeon: Corbin Ade, MD;  Location: [facility] ENDO SUITE;  Service: Endoscopy;;   CERVICAL FUSION     CESAREAN SECTION  1982   COLONOSCOPY     unsure last   COLONOSCOPY WITH PROPOFOL N/A 06/14/2020   Procedure: COLONOSCOPY WITH PROPOFOL;  Surgeon: Corbin Ade, MD;  Location: [facility];  Service: Endoscopy;  Laterality: N/A;  PM   cyst removal face     disk repair     neck and lumbar region   ESOPHAGOGASTRODUODENOSCOPY (EGD) WITH PROPOFOL N/A 06/14/2020   Procedure: ESOPHAGOGASTRODUODENOSCOPY (EGD) WITH PROPOFOL;  Surgeon: Corbin Ade, MD;  [facility];  Service: Endoscopy;  Laterality: N/A;   FOOT SURGERY Bilateral    to file bones down    HARDWARE REMOVAL N/A 09/05/2016   Procedure: HARDWARE REMOVAL AND REPLACEMENT OF LUMBAR FIVE SCREWS;  Surgeon: Lisbeth Renshaw, MD;  Location: [facility]  OR;  Service: Neurosurgery;  Laterality: N/A;   IMPLANTATION / PLACEMENT EPIDURAL NEUROSTIMULATOR ELECTRODES     INTERCOSTAL NERVE BLOCK Right 07/29/2020   Procedure: INTERCOSTAL NERVE BLOCK;  Surgeon: Loreli Slot, MD;  Location[facility];  Service: Thoracic;  Laterality: Right;   LAMINECTOMY     2006   lobectomy Right 07/2020   LUMBAR FUSION     LYMPH NODE BIOPSY Right 07/29/2020  Procedure: LYMPH NODE BIOPSY;  Surgeon: Loreli SlotHendrickson, Steven C, MD;  [facility];  Service: Thoracic;  Laterality: Right;   POLYPECTOMY  06/14/2020   Procedure: POLYPECTOMY INTESTINAL;  Surgeon: Corbin Adeourk, Robert M, MD;  [facility];  Service: Endoscopy;;   SPINAL CORD STIMULATOR BATTERY EXCHANGE     SPINAL CORD STIMULATOR INSERTION N/A 07/05/2016   Procedure: REPLACEMENT OF LUMBAR SPINAL CORD STIMULATOR BATTERY;  Surgeon: Tressie StalkerJeffrey Jenkins, MD;  [facility];  Service: Neurosurgery;  Laterality: N/A;   TONSILLECTOMY        Chief Complaint  Patient presents with   Shortness of Breath      HPI:    Rodena Medinvelyn Ju  is a 67 y.o. 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    Review of systems:    In addition to the HPI above,   A full Review of  Systems was done, all other systems reviewed are negative except as noted above in HPI , .    Social History:  Reviewed by me    Social History   Tobacco Use   Smoking status: Every Day    Packs/day: 0.25    Years: 35.00    Additional pack years: 0.00    Total pack years: 8.75    Types: Cigarettes   Smokeless tobacco: Never   Tobacco comments:    trying to quit  Substance Use Topics   Alcohol use: Yes    Alcohol/week: 2.0 standard drinks of alcohol    Types: 2 Glasses of wine  per week    Comment: occasionally       Family History :  Reviewed by me    Family History  Problem Relation Age of Onset   Heart disease Mother    Diabetes Mother    Hypertension Mother    Hyperlipidemia Mother    COPD Mother        smoked   Cancer Mother        bile duct   COPD Father    Diabetes Father    Heart disease Father    Cancer Father        throat   Dementia Father    Hypertension Sister    Hyperlipidemia Sister    Hypertension Brother    Hyperlipidemia Brother    Hypertension Brother    Hyperlipidemia Brother    Emphysema Maternal Grandmother        smoked   Asthma Son    Colon cancer Neg Hx    Colon polyps Neg Hx    Pancreatic cancer Neg Hx    Esophageal cancer Neg Hx    Inflammatory bowel disease Neg Hx    Liver disease Neg Hx    Rectal cancer Neg Hx    Stomach cancer Neg Hx    ********   Home Medications:   Prior to Admission medications   Medication Sig Start Date End Date Taking? Authorizing Provider  acetaminophen (TYLENOL) 500 MG tablet Take 1,000 mg by mouth every 6 (six) hours as needed for headache.    [provider]  albuterol (VENTOLIN HFA) 108 (90 Base) MCG/ACT inhaler 2puffs up to every 4 hours if can't catch your breath 01/11/22   Nyoka CowdenWert, Michael B, MD  amLODipine (NORVASC) 10 MG tablet Take 1 tablet (10 mg total) by mouth  daily. 09/26/21   Delynn Flavin M, DO  budesonide (PULMICORT) 0.25 MG/2ML nebulizer solution Take 2 mLs (0.25 mg total) by nebulization in the morning and at bedtime. 01/11/22   Nyoka Cowden, MD  dextromethorphan (DELSYM) 30 MG/5ML liquid Take 30 mg by mouth as needed for cough.    [provider]  diclofenac Sodium (VOLTAREN) 1 % GEL Apply 2 g topically 4 (four) times daily. 09/05/21   [provider]  diphenoxylate-atropine (LOMOTIL) 2.5-0.025 MG tablet Take 1 tablet by mouth 3 (three) times daily as needed for diarrhea or loose stools. 06/06/22   Osvaldo Shipper, MD  folic acid  (FOLVITE) 1 MG tablet Take 1 tablet (1 mg total) by mouth daily. 06/07/22   Osvaldo Shipper, MD  formoterol (PERFOROMIST) 20 MCG/2ML nebulizer solution Take 2 mLs (20 mcg total) by nebulization 2 (two) times daily. Use in nebulizer twice daily perfectly regularly 01/11/22   Nyoka Cowden, MD  Guaifenesin [facility] MAXIMUM STRENGTH) 1200 MG TB12 Take 1 tablet by mouth 2 (two) times daily.    [provider]  magic mouthwash SOLN Take 5 mLs by mouth 3 (three) times daily as needed for mouth pain. 10/26/21   Raliegh Ip, DO  metoCLOPramide (REGLAN) 5 MG tablet Take 1 tablet (5 mg total) by mouth every 6 (six) hours as needed for nausea. 02/14/22 05/31/22  Johnson, Clanford L, MD  mirtazapine (REMERON) 15 MG tablet Take 15 mg by mouth at bedtime. 09/26/21   [provider]  Nutritional Supplements (ENSURE HIGH PROTEIN) LIQD Drink 1 can 3 times daily. 08/30/20   Raliegh Ip, DO  omeprazole (PRILOSEC) 40 MG capsule TAKE 1 CAPSULE BY MOUTH 1 TO 2 TIMES DAILY BEFORE A MEAL Patient taking differently: Take 40 mg by mouth See admin instructions. Take 1 capsule by mouth 1 to 2 times daily before a meal 04/17/22   Brooke Bonito L, NP  oxybutynin (DITROPAN-XL) 5 MG 24 hr tablet Take 5 mg by mouth daily. 12/12/21   [provider]  oxyCODONE-acetaminophen (PERCOCET) 10-325 MG tablet Take 1 tablet by mouth 5 (five) times daily. 08/27/20   [provider]  OXYCONTIN 15 MG 12 hr tablet Take 15 mg by mouth every 12 (twelve) hours. 08/28/20   [provider]  OXYGEN Inhale 6-10 L into the lungs See admin instructions. continuous    [provider]  Pancrelipase, Lip-Prot-Amyl, 24000-76000 units CPEP Take 2 capsules (48,000 Units total) by mouth See admin instructions. Take 2 caps daily with meals and 1 cap with snacks 02/14/22   Johnson, Clanford L, MD  predniSONE (DELTASONE) 20 MG tablet Tablets once daily for 3 days followed by 1 tablet once daily for 3 days  and then stop 06/07/22   Osvaldo Shipper, MD  prochlorperazine (COMPAZINE) 25 MG suppository Place 1 suppository (25 mg total) rectally every 12 (twelve) hours as needed for nausea or vomiting. 08/07/21   Shon Hale, MD  revefenacin (YUPELRI) 175 MCG/3ML nebulizer solution Inhale 3 mLs (175 mcg total) into the lungs daily. Patient taking differently: Inhale 175 mcg into the lungs daily. Do not mix with other nebulizer solutions 01/11/22   Nyoka Cowden, MD  saccharomyces boulardii (FLORASTOR) 250 MG capsule Take 1 capsule (250 mg total) by mouth 2 (two) times daily for 14 days. 06/06/22 06/20/22  Osvaldo Shipper, MD  tiZANidine (ZANAFLEX) 4 MG capsule Take 1 capsule (4 mg total) by mouth 3 (three) times daily as needed for muscle spasms. 09/13/21   Gwenlyn Perking,  Mikle Bosworth, MD  traZODone (DESYREL) 150 MG tablet Take 2 tablets (300 mg total) by mouth at bedtime. 02/14/22   Cleora Fleet, MD     Allergies:     Allergies  Allergen Reactions   Lyrica [Pregabalin] Other (See Comments)    EXTREME SHAKING/TREMBLING   Darvon [Propoxyphene]     UNSPECIFIED REACTION    Gabapentin     Extreme shaking and trembling    Augmentin [Amoxicillin-Pot Clavulanate] Nausea And Vomiting    Has patient had a PCN reaction causing immediate rash, facial/tongue/throat swelling, SOB or lightheadedness with hypotension:no Has patient had a PCN reaction causing severe rash involving mucus membranes or skin necrosis:No Has patient had a PCN reaction that required hospitalization:No Has patient had a PCN reaction occurring within the last 10 years:Yes--ONLY N/V If all of the above answers are "NO", then may proceed with Cephalosporin use.    Erythromycin Itching and Rash        Vibramycin [Doxycycline Calcium] Itching and Rash    "SPLOTCHING "     Physical Exam:   Vitals  Blood pressure 112/75, pulse (!) 119, temperature 98.9 F (37.2 C), temperature source Oral, resp. rate (!) 21, SpO2 (!) 88 %.  Physical  Examination: General appearance - alert,  in no distress  Mental status - alert, oriented to person, place, and time, *** Eyes - sclera anicteric Neck - supple, no JVD elevation , Chest - clear  to auscultation bilaterally, symmetrical air movement, *** Heart - S1 and S2 normal, regular *** Abdomen - soft, nontender, nondistended, +BS Neurological - screening mental status exam normal, neck supple without rigidity, cranial nerves II through XII intact, DTR's normal and symmetric Extremities - no pedal edema noted, intact peripheral pulses *** Skin - warm, dry     Data Review:    CBC Recent Labs  Lab 07/09/22 0640  WBC 10.0  HGB 8.9*  HCT 31.4*  PLT 323  MCV 115.9*  MCH 32.8  MCHC 28.3*  RDW 14.2  LYMPHSABS 1.5  MONOABS 0.8  EOSABS 0.1  BASOSABS 0.1   ------------------------------------------------------------------------------------------------------------------  Chemistries  Recent Labs  Lab 07/09/2022 0640  NA 135  K 4.3  CL 94*  CO2 35*  GLUCOSE 129*  BUN 12  CREATININE 0.53  CALCIUM 8.5*   ------------------------------------------------------------------------------------------------------------------ estimated creatinine clearance is 47.3 mL/min (by C-G formula based on SCr of 0.53 mg/dL). ------------------------------------------------------------------------------------------------------------------ ------------------------------------------------------------------------------------------------------------------    Component Value Date/Time   BNP 68.0 2022-07-09 0844    Urinalysis    Component Value Date/Time   COLORURINE YELLOW 02/04/2022 2141   APPEARANCEUR CLEAR 02/04/2022 2141   APPEARANCEUR Clear 06/07/2021 1524   LABSPEC 1.014 02/04/2022 2141   PHURINE 6.0 02/04/2022 2141   GLUCOSEU NEGATIVE 02/04/2022 2141   HGBUR NEGATIVE 02/04/2022 2141   BILIRUBINUR NEGATIVE 02/04/2022 2141   BILIRUBINUR Negative 06/07/2021 1524   KETONESUR 20 (A)  02/04/2022 2141   PROTEINUR 100 (A) 02/04/2022 2141   NITRITE NEGATIVE 02/04/2022 2141   LEUKOCYTESUR NEGATIVE 02/04/2022 2141     Imaging Results:    DG Chest Port 1 View  Result Date: Jul 09, 2022 CLINICAL DATA:  67 year old female with shortness of breath. COPD. History of right upper lobectomy with recurrent 2 cm spiculated right lung mass on CTA last year. EXAM: PORTABLE CHEST 1 VIEW COMPARISON:  Portable chest 06/01/2022 and earlier. FINDINGS: Portable AP upright view at 0648 hours. Stable lung volumes. Chronic right upper lung architectural distortion appears stable from last month, somewhat less nodular since last year and  there might have been interval radiation treatment, uncertain. Stable cardiac size and mediastinal contours. Chronic thoracic spinal stimulator device. No superimposed pneumothorax, pulmonary edema, acute pleural effusion or other acute lung opacity. Stable visualized osseous structures. Negative visible bowel gas. IMPRESSION: Chronic lung disease with prior lobectomy and query radiation therapy to the right upper lung since last year. Stable architectural distortion, ventilation since last month. No acute cardiopulmonary abnormality identified. Electronically Signed   By: Odessa Fleming M.D.   On: 07/07/2022 06:56    Radiological Exams on Admission: DG Chest Port 1 View  Result Date: 07-Jul-2022 CLINICAL DATA:  67 year old female with shortness of breath. COPD. History of right upper lobectomy with recurrent 2 cm spiculated right lung mass on CTA last year. EXAM: PORTABLE CHEST 1 VIEW COMPARISON:  Portable chest 06/01/2022 and earlier. FINDINGS: Portable AP upright view at 0648 hours. Stable lung volumes. Chronic right upper lung architectural distortion appears stable from last month, somewhat less nodular since last year and there might have been interval radiation treatment, uncertain. Stable cardiac size and mediastinal contours. Chronic thoracic spinal stimulator device. No  superimposed pneumothorax, pulmonary edema, acute pleural effusion or other acute lung opacity. Stable visualized osseous structures. Negative visible bowel gas. IMPRESSION: Chronic lung disease with prior lobectomy and query radiation therapy to the right upper lung since last year. Stable architectural distortion, ventilation since last month. No acute cardiopulmonary abnormality identified. Electronically Signed   By: Odessa Fleming M.D.   On: 2022/07/07 06:56    DVT Prophylaxis -SCD /Heparin AM Labs Ordered, also please review Full Orders  Family Communication: Admission, patients condition and plan of care including tests being ordered have been discussed with the patient who indicate understanding and agree with the plan   Condition stable,  Shon Hale M.D on 07-07-2022 at 6:07 PM Go to [email or web address] -  for contact info  [facility] - Office  [phone]

## 2022-06-12 NOTE — ED Notes (Signed)
Pt calling out for help and states she feels like she is going to vomit; bipap mask removed and EDP notified and order for zofran given  RT notified and informed to place pt on O2 at 6L via [place]  Zofran given

## 2022-06-12 NOTE — ED Provider Notes (Signed)
[place] [facility] DEPARTMENT AT [facility]  Provider Note  CSN: [id number] Arrival date & time: 07/03/2022 [id number]  History Chief Complaint  Patient presents with   Shortness of Breath    Jasmine [name] is a 67 y.o. female with history of COPD, s/p right total pneumonectomy for lung cancer, chronic respiratory failure on 6L [place] at home also recently admission 3/27 for multiple issues including LLL Pneumonia. Discharged about a week ago. Brought by EMS for worsening breathing. Diminished lung sounds per EMS, improved to wheezing after neb, but she did not tolerate breathing treatment and was placed on NRB with some improvement. She has had cough, chest discomfort. Abdominal pain is chronic. Given solumedrol enroute.    Home Medications Prior to Admission medications   Medication Sig Start Date End Date Taking? Authorizing Provider  acetaminophen (TYLENOL) 500 MG tablet Take 1,000 mg by mouth every 6 (six) hours as needed for headache.    [provider]  albuterol (VENTOLIN HFA) 108 (90 Base) MCG/ACT inhaler 2puffs up to every 4 hours if can't catch your breath 01/11/22   Nyoka CowdenWert, Michael B, MD  amLODipine (NORVASC) 10 MG tablet Take 1 tablet (10 mg total) by mouth daily. 09/26/21   Delynn FlavinGottschalk, Ashly M, DO  budesonide (PULMICORT) 0.25 MG/2ML nebulizer solution Take 2 mLs (0.25 mg total) by nebulization in the morning and at bedtime. 01/11/22   Nyoka CowdenWert, Michael B, MD  dextromethorphan (DELSYM) 30 MG/5ML liquid Take 30 mg by mouth as needed for cough.    [provider]  diclofenac Sodium (VOLTAREN) 1 % GEL Apply 2 g topically 4 (four) times daily. 09/05/21   [provider]  diphenoxylate-atropine (LOMOTIL) 2.5-0.025 MG tablet Take 1 tablet by mouth 3 (three) times daily as needed for diarrhea or loose stools. 06/06/22   Osvaldo ShipperKrishnan, Gokul, MD  folic acid (FOLVITE) 1 MG tablet Take 1 tablet (1 mg total) by mouth daily. 06/07/22   Osvaldo ShipperKrishnan, Gokul, MD  formoterol (PERFOROMIST)  20 MCG/2ML nebulizer solution Take 2 mLs (20 mcg total) by nebulization 2 (two) times daily. Use in nebulizer twice daily perfectly regularly 01/11/22   Nyoka CowdenWert, Michael B, MD  Guaifenesin [facility](MUCINEX MAXIMUM STRENGTH) 1200 MG TB12 Take 1 tablet by mouth 2 (two) times daily.    [provider]  magic mouthwash SOLN Take 5 mLs by mouth 3 (three) times daily as needed for mouth pain. 10/26/21   Raliegh IpGottschalk, Ashly M, DO  metoCLOPramide (REGLAN) 5 MG tablet Take 1 tablet (5 mg total) by mouth every 6 (six) hours as needed for nausea. 02/14/22 05/31/22  Johnson, Clanford L, MD  mirtazapine (REMERON) 15 MG tablet Take 15 mg by mouth at bedtime. 09/26/21   [provider]  Nutritional Supplements (ENSURE HIGH PROTEIN) LIQD Drink 1 can 3 times daily. 08/30/20   Raliegh IpGottschalk, Ashly M, DO  omeprazole (PRILOSEC) 40 MG capsule TAKE 1 CAPSULE BY MOUTH 1 TO 2 TIMES DAILY BEFORE A MEAL Patient taking differently: Take 40 mg by mouth See admin instructions. Take 1 capsule by mouth 1 to 2 times daily before a meal 04/17/22   Brooke BonitoMahon, Courtney L, NP  oxybutynin (DITROPAN-XL) 5 MG 24 hr tablet Take 5 mg by mouth daily. 12/12/21   [provider]  oxyCODONE-acetaminophen (PERCOCET) 10-325 MG tablet Take 1 tablet by mouth 5 (five) times daily. 08/27/20   [provider]  OXYCONTIN 15 MG 12 hr tablet Take 15 mg by mouth every 12 (twelve) hours. 08/28/20   [provider]  OXYGEN Inhale 6-10 L into the lungs See admin instructions. continuous    [provider]  Pancrelipase, Lip-Prot-Amyl, 24000-76000 units CPEP Take 2 capsules (48,000 Units total) by mouth See admin instructions. Take 2 caps daily with meals and 1 cap with snacks 02/14/22   Johnson, Clanford L, MD  predniSONE (DELTASONE) 20 MG tablet Tablets once daily for 3 days followed by 1 tablet once daily for 3 days and then stop 06/07/22   Osvaldo Shipper, MD  prochlorperazine (COMPAZINE) 25 MG suppository Place 1 suppository (25 mg  total) rectally every 12 (twelve) hours as needed for nausea or vomiting. 08/07/21   Shon Hale, MD  revefenacin (YUPELRI) 175 MCG/3ML nebulizer solution Inhale 3 mLs (175 mcg total) into the lungs daily. Patient taking differently: Inhale 175 mcg into the lungs daily. Do not mix with other nebulizer solutions 01/11/22   Nyoka Cowden, MD  saccharomyces boulardii (FLORASTOR) 250 MG capsule Take 1 capsule (250 mg total) by mouth 2 (two) times daily for 14 days. 06/06/22 06/20/22  Osvaldo Shipper, MD  tiZANidine (ZANAFLEX) 4 MG capsule Take 1 capsule (4 mg total) by mouth 3 (three) times daily as needed for muscle spasms. 09/13/21   Vassie Loll, MD  traZODone (DESYREL) 150 MG tablet Take 2 tablets (300 mg total) by mouth at bedtime. 02/14/22   Johnson, Clanford L, MD     Allergies    Lyrica [pregabalin], Darvon [propoxyphene], Gabapentin, Augmentin [amoxicillin-pot clavulanate], Erythromycin, and Vibramycin [doxycycline calcium]   Review of Systems   Review of Systems Please see HPI for pertinent positives and negatives  Physical Exam BP 110/76   Pulse (!) 114   Temp 97.6 F (36.4 C) (Oral)   Resp (!) 22   SpO2 100%   Physical Exam Vitals and nursing note reviewed.  Constitutional:      Appearance: Normal appearance.  HENT:     Head: Normocephalic and atraumatic.     Nose: Nose normal.     Mouth/Throat:     Mouth: Mucous membranes are moist.  Eyes:     Extraocular Movements: Extraocular movements intact.     Conjunctiva/sclera: Conjunctivae normal.  Cardiovascular:     Rate and Rhythm: Normal rate.  Pulmonary:     Effort: Tachypnea present.     Breath sounds: Examination of the left-upper field reveals wheezing. Examination of the left-middle field reveals wheezing. Wheezing present.     Comments: Increased WOB Abdominal:     General: Abdomen is flat.     Palpations: Abdomen is soft.     Tenderness: There is no abdominal tenderness.  Musculoskeletal:        General:  No swelling. Normal range of motion.     Cervical back: Neck supple.  Skin:    General: Skin is warm and dry.  Neurological:     General: No focal deficit present.     Mental Status: She is alert.  Psychiatric:        Mood and Affect: Mood normal.     ED Results / Procedures / Treatments   EKG EKG Interpretation  Date/Time:  Monday June 12 2022 06:47:34 EDT Ventricular Rate:  116 PR Interval:  155 QRS Duration: 78 QT Interval:  324 QTC Calculation: 451 R Axis:   69 Text Interpretation: Sinus tachycardia Nonspecific T abnormalities, lateral leads Artifact in lead(s) II III aVL aVF and baseline wander in lead(s) V6 No significant change since last tracing Confirmed by Susy Frizzle [phone]) on 06/10/2022 6:50:22 AM  Procedures .Critical  Care  Performed by: Pollyann Savoy, MD Authorized by: Pollyann Savoy, MD   Critical care provider statement:    Critical care time (minutes):  35   Critical care was necessary to treat or prevent imminent or life-threatening deterioration of the following conditions:  Respiratory failure   Critical care was time spent personally by me on the following activities:  Development of treatment plan with patient or surrogate, discussions with consultants, evaluation of patient's response to treatment, examination of patient, ordering and review of laboratory studies, ordering and review of radiographic studies, ordering and performing treatments and interventions, pulse oximetry, re-evaluation of patient's condition and review of old charts   Medications Ordered in the ED Medications  ipratropium-albuterol (DUONEB) 0.5-2.5 (3) MG/3ML nebulizer solution 3 mL (3 mLs Nebulization Given 06/08/2022 0651)    Initial Impression and Plan  Patient here with SOB, history of COPD, pneumonectomy and recent LLL pneumonia. Will give a trial of BiPAP for comfort, SpO2 is 100% on a NRB. Duoneb ordered. Check labs, EKG and CXR.   ED Course   Clinical Course  as of 06/19/2022 0726  Mon Jun 12, 2022  7416 CBC with normal WBC, anemia slightly worsened from baseline.  [CS]  0701 I personally viewed the images from radiology studies and agree with radiologist interpretation: CXR does not show any acute change. She has not had a pneumonectomy but instead it appears she has had a right upper lobectomy.   [CS]  0725 Care of the patient signed out to Dr. Estell Harpin pending re-evaluation and anticipate admission.  [CS]    Clinical Course User Index [CS] Pollyann Savoy, MD     MDM Rules/Calculators/A&P Medical Decision Making Amount and/or Complexity of Data Reviewed Labs: ordered. Radiology: ordered.  Risk Prescription drug management.     Final Clinical Impression(s) / ED Diagnoses Final diagnoses:  Acute on chronic respiratory failure with hypoxia    Rx / DC Orders ED Discharge Orders     None        Pollyann Savoy, MD 06/26/2022 [phone]

## 2022-06-12 NOTE — Progress Notes (Signed)
When pt arrived from ED we placed her back on 6L [place]. She did well for a short time but was becoming increasingly SOB. BiPap has been placed back on per MD verbal order.

## 2022-06-13 DIAGNOSIS — J441 Chronic obstructive pulmonary disease with (acute) exacerbation: Secondary | ICD-10-CM | POA: Diagnosis not present

## 2022-06-13 LAB — BASIC METABOLIC PANEL
Anion gap: 8 (ref 5–15)
BUN: 11 mg/dL (ref 8–23)
CO2: 30 mmol/L (ref 22–32)
Calcium: 8.4 mg/dL — ABNORMAL LOW (ref 8.9–10.3)
Chloride: 97 mmol/L — ABNORMAL LOW (ref 98–111)
Creatinine, Ser: 0.34 mg/dL — ABNORMAL LOW (ref 0.44–1.00)
GFR, Estimated: >60 mL/min (ref >60)
Glucose, Bld: 132 mg/dL — ABNORMAL HIGH (ref 70–99)
Potassium: 4.1 mmol/L (ref 3.5–5.1)
Sodium: 135 mmol/L (ref 135–145)

## 2022-06-13 MED ORDER — DOXYCYCLINE HYCLATE 100 MG PO TABS
100.0000 mg | ORAL_TABLET | Freq: Two times a day (BID) | ORAL | Status: DC
  Administered 2022-06-13 (×2): 100 mg via ORAL
  Filled 2022-06-13 (×2): qty 1

## 2022-06-13 MED ORDER — CHLORHEXIDINE GLUCONATE CLOTH 2 % EX PADS
6.0000 | MEDICATED_PAD | Freq: Every evening | CUTANEOUS | Status: DC
  Administered 2022-06-13: 6 via TOPICAL

## 2022-06-13 MED ORDER — TRAZODONE HCL 50 MG PO TABS
150.0000 mg | ORAL_TABLET | Freq: Every day | ORAL | Status: DC
  Administered 2022-06-13: 150 mg via ORAL
  Filled 2022-06-13: qty 3

## 2022-06-13 MED ORDER — TIZANIDINE HCL 2 MG PO TABS: 4.0000 mg | ORAL_TABLET | Freq: Two times a day (BID) | ORAL | Status: DC | PRN

## 2022-06-13 MED ORDER — OXYCODONE HCL 5 MG PO TABS
5.0000 mg | ORAL_TABLET | Freq: Four times a day (QID) | ORAL | Status: DC | PRN
  Administered 2022-06-13 (×2): 5 mg via ORAL
  Filled 2022-06-13 (×2): qty 1

## 2022-06-13 NOTE — Progress Notes (Signed)
This AM, pt called and wanted to be off the BIPAP, tried encouraging her to placed it back again after taking some PO meds and a rest afterwards but pt insisted on removing it. Pt tolerated being on the mask for almost rest of the night. RT informed. Agreed that if pt becomes tachypneic and sob again or when need arises, will placed BIPAP back. Kept monitored.

## 2022-06-13 NOTE — Progress Notes (Signed)
PROGRESS NOTE     Jasmine [name], is a 67 y.o. female, DOB - [birth year], YBW:[id number]  Admit date - 06/14/2022   Admitting Physician Elena Davia Mariea Clonts, MD  Outpatient Primary MD for the patient is Raliegh Ip, DO  LOS - 1  Chief Complaint  Patient presents with   Shortness of Breath      Brief Narrative:  past medical history relevant for history of lung cancer with status post right upper lobe lobectomy, COPD and ongoing tobacco abuse, chronic respiratory failure on 6-7 L, chronic opioid dependence secondary to chronic back pain, hypertension, hyperlipidemia,nephrolithiasis, colon polyps (TA's), GERD, idiopathic pancreatitis with chronic diarrhea, abnormal pancreas imaging with dilated duct, chronically dilated bile duct was recently discharged from the hospital on 06/06/2022 after treatment for respiratory infection/COPD readmitted on 06/13/2022 with acute on chronic hypoxic respiratory failure secondary to COPD exacerbation requiring BiPAP   -Assessment and Plan:  1)Acute COPD Exacerbation- no definite pneumonia--continues to require BiPAP on and off -c/n IV Solu-Medrol , c/n  mucolytics, doxycycline and bronchodilators as ordered, supplemental oxygen as ordered.  -Currently requiring BiPAP -Patient sees Dr. 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 Sherene Sires as outpatient   4)-Colitis versus diarrhea due to chronic pancreatitis  Diarrhea thought to be due to either chronic pancreatitis or colitis.  Only mild wall thickening was noted on CT scan.  No stool studies were  sent.   Patient mentions that she was diagnosed with C. difficile about 6 months ago.  The only positive C. difficile test available in our system is from September 2022. No results in Care Everywhere. Does not appear to be infectious since this has been ongoing for several weeks.  She is afebrile and WBC is normal. -Chronic changes on abdominal CT in regards to her pancreas.  --Probiotics and as needed antidiarrheal agents advised -Continue Creon Outpatient follow-up with gastroenterology.    5)Chronic pain syndrome/chronic back pain Be judicious with pain meds due to concern for respiratory depression   6)HTN----Stable, Continue Amlodipine.    7)Severe protein calorie malnutrition- -Body mass index is 17.69 kg/m. Nutrition Problem: Severe Malnutrition Etiology: chronic illness (acute   8)Social/ethics--- recurrent admission for respiratory problems -Overall prognosis is poor -Advanced directive discussed with patient and husband -Full CODE STATUS without limitations of treatment -Palliative care consult requested to further delineate goals of care  Other/Update 06/13/22 -Patient Requesting pain medications----today patient became very drowsy after getting pain medications--- respirations became shallow, O2 sats dropped down to the 60s, she was placed back on BiPAP----respiratory status improved on BiPAP -Drowsiness improving but she remains drowsy -Husband at bedside, will continue to monitor -Opiates and muscle relaxers extremities adjusted downwards today--- -- rationale for backing off sedatives with concerns for respiratory depression explained to patient and husband at bedside  Status is: Inpatient   Disposition: The patient is from: Home              Anticipated d/c is to: Home              Anticipated d/c date is: 3 days  Patient currently is not medically stable to d/c. Barriers: Not Clinically Stable-   Code Status :  -  Code Status: Full Code   Family  Communication:    Discussed with husband at bedside  DVT Prophylaxis  :   - SCDs   heparin injection 5,000 Units Start: 06/21/2022 1800 SCDs Start: 06/26/2022 1548 Place TED hose Start: 07/04/2022 1548   Lab Results  Component Value Date   PLT 323 07/01/2022    Inpatient Medications  Scheduled Meds:  albuterol  10 mg/hr Nebulization Once   amLODipine  10 mg Oral Daily   Chlorhexidine Gluconate Cloth  6 each Topical Nightly   dextromethorphan-guaiFENesin  1 tablet Oral BID   doxycycline  100 mg Oral Q12H   folic acid  1 mg Oral Daily   heparin  5,000 Units Subcutaneous Q8H   ipratropium-albuterol  3 mL Nebulization Q6H   lipase/protease/amylase  24,000 Units Oral TID AC   methylPREDNISolone (SOLU-MEDROL) injection  40 mg Intravenous Q12H   mirtazapine  15 mg Oral QHS   oxybutynin  5 mg Oral Daily   oxyCODONE  15 mg Oral Q12H   pantoprazole  40 mg Oral Daily   saccharomyces boulardii  250 mg Oral BID   sodium chloride flush  3 mL Intravenous Q12H   sodium chloride flush  3 mL Intravenous Q12H   traZODone  150 mg Oral QHS   Continuous Infusions:  sodium chloride     PRN Meds:.sodium chloride, acetaminophen **OR** acetaminophen, albuterol, bisacodyl, diphenoxylate-atropine, ondansetron **OR** ondansetron (ZOFRAN) IV, oxyCODONE, prochlorperazine, sodium chloride flush, tiZANidine   Anti-infectives (From admission, onward)    Start     Dose/Rate Route Frequency Ordered Stop   06/13/22 1415  doxycycline (VIBRA-TABS) tablet 100 mg        100 mg Oral Every 12 hours 06/13/22 1315           Subjective: Jasmine [name] today has no fevers, no emesis,  No chest pain,   -Cough is mostly nonproductive Other/Update 06/13/22 -Patient Requesting pain medications----today patient became very drowsy after getting pain medications--- respirations became shallow, O2 sats dropped down to the 60s, she was placed back on BiPAP----respiratory status improved on BiPAP -Drowsiness improving but she  remains drowsy -Husband at bedside, will continue to monitor -Opiates and muscle relaxers extremities adjusted downwards today--- -- rationale for backing off sedatives with concerns for respiratory depression explained to patient and husband at bedside  Objective: Vitals:   06/13/22 1218 06/13/22 1227 06/13/22 1231 06/13/22 1242  BP: (!) 148/74     Pulse: 100 (!) 101  94  Resp: (!) 24 (!) 22  (!) 23  Temp:      TempSrc:      SpO2: 96% 97% 95% 95%  Weight:        Intake/Output Summary (Last 24 hours) at 06/13/2022 1421 Last data filed at 06/13/2022 1411 Gross per 24 hour  Intake 240 ml  Output 650 ml  Net -410 ml   Filed Weights   06/13/22 0423  Weight: 45.3 kg    Physical Exam  Gen:-Somewhat sleepy waking up more on BiPAP now, conversational dyspnea HEENT:- [place].AT, No sclera icterus Mouth/Nose--BiPAP mask in situ Neck-Supple Neck,No JVD,.  Lungs-diminished breath sounds with scattered wheezes CV- S1, S2 normal, regular  Abd-  +ve B.Sounds, Abd Soft, No tenderness,    Extremity/Skin:- No  edema, pedal pulses present  Psych-affect is anxious, oriented x3 Neuro-no new focal deficits, no tremors  Data Reviewed: I have personally  reviewed following labs and imaging studies  CBC: Recent Labs  Lab 06/19/2022 0640  WBC 10.0  NEUTROABS 7.3  HGB 8.9*  HCT 31.4*  MCV 115.9*  PLT 323   Basic Metabolic Panel: Recent Labs  Lab 06/05/2022 0640 06/13/22 0534  NA 135 135  K 4.3 4.1  CL 94* 97*  CO2 35* 30  GLUCOSE 129* 132*  BUN 12 11  CREATININE 0.53 0.34*  CALCIUM 8.5* 8.4*   GFR: Estimated Creatinine Clearance: 48.1 mL/min (A) (by C-G formula based on SCr of 0.34 mg/dL (L)).  Recent Results (from the past 240 hour(s))  MRSA Next Gen by PCR, Nasal     Status: None   Collection Time: 07/01/2022  3:40 PM   Specimen: Nasal Mucosa; Nasal Swab  Result Value Ref Range Status   MRSA by PCR Next Gen NOT DETECTED NOT DETECTED Final    Comment: (NOTE) The GeneXpert MRSA  Assay (FDA approved for NASAL specimens only), is one component of a comprehensive MRSA colonization surveillance program. It is not intended to diagnose MRSA infection nor to guide or monitor treatment for MRSA infections. Test performance is not FDA approved in patients less than 69 years old. Performed at [facility], [address] [place], [place] [address]    [facility]  Result Date: 06/13/2022 CLINICAL DATA:  67 year old female with shortness of breath. COPD. History of right upper lobectomy with recurrent 2 cm spiculated right lung mass on CTA last year. EXAM: PORTABLE CHEST 1 VIEW COMPARISON:  Portable chest 06/01/2022 and earlier. FINDINGS: Portable AP upright view at 0648 hours. Stable lung volumes. Chronic right upper lung architectural distortion appears stable from last month, somewhat less nodular since last year and there might have been interval radiation treatment, uncertain. Stable cardiac size and mediastinal contours. Chronic thoracic spinal stimulator device. No superimposed pneumothorax, pulmonary edema, acute pleural effusion or other acute lung opacity. Stable visualized osseous structures. Negative visible bowel gas. IMPRESSION: Chronic lung disease with prior lobectomy and query radiation therapy to the right upper lung since last year. Stable architectural distortion, ventilation since last month. No acute cardiopulmonary abnormality identified. Electronically Signed   By: Odessa FlemingH  Hall M.D.   On: 07/04/2022 06:56    Scheduled Meds:  albuterol  10 mg/hr Nebulization Once   amLODipine  10 mg Oral Daily   Chlorhexidine Gluconate Cloth  6 each Topical Nightly   dextromethorphan-guaiFENesin  1 tablet Oral BID   doxycycline  100 mg Oral Q12H   folic acid  1 mg Oral Daily   heparin  5,000 Units Subcutaneous Q8H   ipratropium-albuterol  3 mL Nebulization Q6H   lipase/protease/amylase  24,000 Units Oral TID AC   methylPREDNISolone (SOLU-MEDROL)  injection  40 mg Intravenous Q12H   mirtazapine  15 mg Oral QHS   oxybutynin  5 mg Oral Daily   oxyCODONE  15 mg Oral Q12H   pantoprazole  40 mg Oral Daily   saccharomyces boulardii  250 mg Oral BID   sodium chloride flush  3 mL Intravenous Q12H   sodium chloride flush  3 mL Intravenous Q12H   traZODone  150 mg Oral QHS   Continuous Infusions:  sodium chloride      LOS: 1 day   Shon Haleourage Tymir Terral M.D on 06/13/2022 at 2:21 PM  Go to [email or web address] - for contact info  [facility] - Office  [phone]  If 7PM-7AM, please contact night-coverage [email or web address] 06/13/2022, 2:21 PM

## 2022-06-13 NOTE — Progress Notes (Signed)
Called to room at this time.Patient only responsive to sternal rub at this time.  Oxygen via nasal canula in place at 4L.  Oxygen saturation in the 60's with a good pleth.  Patient placed on Bipap and RT was called to evaluate patient.  Patient becoming more responsive to verbal stimuli.  Informed patient that her pain medication would probably need to be cut back patient responded with a "No."  Dr Marisa Severin made aware of patients current condition.  Will continue to monitor.

## 2022-06-13 NOTE — Plan of Care (Signed)

## 2022-06-13 NOTE — TOC Initial Note (Signed)
Transition of Care [facility]) - Initial/Assessment Note    Patient Details  Name: Jasmine [name] MRN: [id number] Date of Birth: [birth year]  Transition of Care [facility]) CM/SW Contact:    Leitha Bleak, RN Phone Number: 06/13/2022, 10:55 AM  Clinical Narrative:        Patient recently discharged with COPD exacerbation.  No changes, continues to smoke on 6L home oxygen provided by [employer] Active with [facility] for Home health. TOC following.          Expected Discharge Plan: Home w Home Health Services Barriers to Discharge: Continued Medical Work up   Patient Goals and CMS Choice Patient states their goals for this hospitalization and ongoing recovery are:: to go home CMS Medicare.gov Compare Post Acute Care list provided to:: [name] (must comment) Choice offered to / list presented to : Spouse     Expected Discharge Plan and Services      Living arrangements for the past 2 months: Single Family Home            Prior Living Arrangements/Services Living arrangements for the past 2 months: Single Family Home Lives with:: Spouse            Current home services: DME    Activities of Daily Living Home Assistive Devices/Equipment: Oxygen, Walker (specify type) ADL Screening (condition at time of admission) Patient's cognitive ability adequate to safely complete daily activities?: Yes Is the patient deaf or have difficulty hearing?: No Does the patient have difficulty seeing, even when wearing glasses/contacts?: No Does the patient have difficulty concentrating, remembering, or making decisions?: No Patient able to express need for assistance with ADLs?: Yes Does the patient have difficulty dressing or bathing?: No Independently performs ADLs?: Yes (appropriate for developmental age) Does the patient have difficulty walking or climbing stairs?: Yes Weakness of Legs: Both Weakness of Arms/Hands: Both  Emotional Assessment       Orientation: : Oriented to Self,  Oriented to Place, Oriented to  Time, Oriented to Situation Alcohol / Substance Use: Not Applicable Psych Involvement: No (comment)  Admission diagnosis:  COPD with acute exacerbation [J44.1] Acute on chronic respiratory failure with hypoxia [J96.21] Patient Active Problem List   Diagnosis Date Noted   Colitis 06/01/2022   Left lower lobe pneumonia 06/01/2022   Essential hypertension 06/01/2022   Infectious colitis 05/31/2022   Abdominal pain, epigastric 02/07/2022   Nausea and vomiting 02/07/2022   Opioid dependence 02/05/2022   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   Dilation of pancreatic duct 01/06/2022   Common bile duct dilation 01/06/2022   COPD exacerbation 09/26/2021   Sepsis 09/23/2021   Chronic pancreatitis 09/09/2021   Hyperlipidemia 09/09/2021   Community acquired pneumonia 08/04/2021   PNA (pneumonia) 08/03/2021   Abdominal pain 06/29/2021   Chronic pain syndrome 03/13/2021   Pressure injury of skin 03/12/2021   Acute on chronic pancreatitis 03/12/2021   Intractable abdominal pain 03/11/2021   Intractable pain 02/12/2021   Squamous cell lung cancer, right 01/13/2021   Atypical chest pain 11/26/2020   Elevated MCV 11/26/2020   Intractable nausea and vomiting 11/26/2020   History of Clostridioides difficile infection 11/26/2020   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HCC) 10/11/2020   Gastroenteritis 10/11/2020   Acute gastroenteritis 10/10/2020   Protein-calorie malnutrition, severe 08/26/2020   Chest pain 08/25/2020   Pleuritic chest pain 08/25/2020   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   GERD (gastroesophageal reflux disease) 04/22/2020   Abnormal CT of the abdomen 04/22/2020   Preventative health care 04/22/2020   Solitary pulmonary  nodule 03/22/2020   Pulmonary infiltrate on chest x-ray 02/24/2020   Nocturnal cough 02/19/2020   Pancreatitis, acute 01/06/2019   Acute pancreatitis 01/05/2019   Cigarette smoker 08/25/2017   Chronic respiratory failure with hypoxia  08/25/2017   Centrilobular emphysema 07/09/2017   Aortic atherosclerosis 07/09/2017   Hepatic steatosis 07/09/2017   Malnutrition of moderate degree 07/02/2017   Acute respiratory failure with hypoxia 06/30/2017   Hypokalemia 06/30/2017   COPD with acute exacerbation 06/30/2017   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/2016   Tobacco abuse 08/04/2016   Iron deficiency anemia 07/11/2016   Easy bruising 07/10/2016   Lumbar stenosis 11/11/2015   Leg pain 09/24/2015   Vitamin D deficiency 07/29/2015   Chronic back pain 02/22/2015   HTN (hypertension) 02/22/2015   Chronic narcotic use 02/22/2015   COPD GOLD II  02/22/2015   Headache 02/22/2015   PCP:  Raliegh Ip, DO Pharmacy:   [facility] [phone] - [place], [place] - [address] AT [facility] [address] [place] [address] Phone: [phone] Fax: [phone]     Social Determinants of Health (SDOH) Social History: SDOH Screenings   Food Insecurity: No Food Insecurity (04/10/2022)  Housing: Low Risk  (04/10/2022)  Transportation Needs: No Transportation Needs (04/10/2022)  Utilities: Not At Risk (04/10/2022)  Alcohol Screen: Low Risk  (04/10/2022)  Depression (PHQ2-9): Low Risk  (04/10/2022)  Financial Resource Strain: Low Risk  (04/10/2022)  Physical Activity: Inactive (04/10/2022)  Social Connections: Moderately Integrated (04/10/2022)  Stress: No Stress Concern Present (04/10/2022)  Tobacco Use: High Risk (05/31/2022)   SDOH Interventions:     Readmission Risk Interventions    06/13/2022   10:55 AM 09/28/2021   11:35 AM 08/05/2021   11:27 AM  Readmission Risk Prevention Plan  Transportation Screening Complete Complete Complete  Medication Review Oceanographer) Complete Complete Complete  PCP or Specialist appointment within 3-5 days of discharge Not Complete    HRI or Home Care Consult Complete Patient refused Patient refused  SW Recovery Care/Counseling Consult Complete Complete  Complete  Palliative Care Screening Not Applicable Not Applicable Not Applicable  Skilled Nursing Facility Not Applicable Not Applicable Not Applicable

## 2022-06-13 NOTE — Progress Notes (Signed)
Patient has wore Bipap all night.  RN called to say she took patient off Bipap and placed back on 6L.

## 2022-06-14 ENCOUNTER — Inpatient Hospital Stay (HOSPITAL_COMMUNITY): Payer: Medicare Other

## 2022-06-14 ENCOUNTER — Encounter (HOSPITAL_COMMUNITY): Payer: Self-pay | Admitting: Family Medicine

## 2022-06-14 DIAGNOSIS — I10 Essential (primary) hypertension: Secondary | ICD-10-CM

## 2022-06-14 DIAGNOSIS — Z515 Encounter for palliative care: Secondary | ICD-10-CM | POA: Diagnosis not present

## 2022-06-14 DIAGNOSIS — J9621 Acute and chronic respiratory failure with hypoxia: Secondary | ICD-10-CM | POA: Diagnosis not present

## 2022-06-14 DIAGNOSIS — J441 Chronic obstructive pulmonary disease with (acute) exacerbation: Secondary | ICD-10-CM | POA: Diagnosis not present

## 2022-06-14 DIAGNOSIS — F17210 Nicotine dependence, cigarettes, uncomplicated: Secondary | ICD-10-CM

## 2022-06-14 DIAGNOSIS — R4589 Other symptoms and signs involving emotional state: Secondary | ICD-10-CM

## 2022-06-14 DIAGNOSIS — G9341 Metabolic encephalopathy: Secondary | ICD-10-CM

## 2022-06-14 DIAGNOSIS — Z7189 Other specified counseling: Secondary | ICD-10-CM

## 2022-06-14 DIAGNOSIS — J9622 Acute and chronic respiratory failure with hypercapnia: Secondary | ICD-10-CM | POA: Diagnosis not present

## 2022-06-14 DIAGNOSIS — G8929 Other chronic pain: Secondary | ICD-10-CM

## 2022-06-14 DIAGNOSIS — E43 Unspecified severe protein-calorie malnutrition: Secondary | ICD-10-CM

## 2022-06-14 DIAGNOSIS — K861 Other chronic pancreatitis: Secondary | ICD-10-CM

## 2022-06-14 LAB — BLOOD GAS, ARTERIAL
Acid-Base Excess: 14.9 mmol/L — ABNORMAL HIGH (ref 0.0–2.0)
Acid-Base Excess: 17.2 mmol/L — ABNORMAL HIGH (ref 0.0–2.0)
Acid-Base Excess: 12.7 mmol/L — ABNORMAL HIGH (ref 0.0–2.0)
Bicarbonate: 46.7 mmol/L — ABNORMAL HIGH (ref 20.0–28.0)
Bicarbonate: 46.8 mmol/L — ABNORMAL HIGH (ref 20.0–28.0)
Bicarbonate: 38.6 mmol/L — ABNORMAL HIGH (ref 20.0–28.0)
Drawn by: 35043
Drawn by: 35043
Drawn by: 29503
FIO2: 100 %
FIO2: 40 %
MECHVT: 420 mL
O2 Saturation: 97.4 %
O2 Saturation: 99.9 %
O2 Saturation: 100 %
PEEP: 5 cmH2O
Patient temperature: 35.6
Patient temperature: 35.6
Patient temperature: 37
RATE: 18 resp/min
pCO2 arterial: 118 mmHg (ref 32–48)
pCO2 arterial: 76 mmHg (ref 32–48)
pCO2 arterial: 53 mmHg — ABNORMAL HIGH (ref 32–48)
pH, Arterial: 7.2 — ABNORMAL LOW (ref 7.35–7.45)
pH, Arterial: 7.39 (ref 7.35–7.45)
pH, Arterial: 7.47 — ABNORMAL HIGH (ref 7.35–7.45)
pO2, Arterial: 75 mmHg — ABNORMAL LOW (ref 83–108)
pO2, Arterial: 241 mmHg — ABNORMAL HIGH (ref 83–108)
pO2, Arterial: 147 mmHg — ABNORMAL HIGH (ref 83–108)

## 2022-06-14 LAB — GLUCOSE, CAPILLARY
Glucose-Capillary: 168 mg/dL — ABNORMAL HIGH (ref 70–99)
Glucose-Capillary: 151 mg/dL — ABNORMAL HIGH (ref 70–99)
Glucose-Capillary: 114 mg/dL — ABNORMAL HIGH (ref 70–99)
Glucose-Capillary: 145 mg/dL — ABNORMAL HIGH (ref 70–99)

## 2022-06-14 LAB — MAGNESIUM
Magnesium: 1.7 mg/dL (ref 1.7–2.4)
Magnesium: 1.5 mg/dL — ABNORMAL LOW (ref 1.7–2.4)

## 2022-06-14 LAB — PHOSPHORUS
Phosphorus: 1.3 mg/dL — ABNORMAL LOW (ref 2.5–4.6)
Phosphorus: <1 mg/dL — CL (ref 2.5–4.6)

## 2022-06-14 LAB — PROCALCITONIN: Procalcitonin: 4.79 ng/mL

## 2022-06-14 MED ORDER — MIDAZOLAM HCL 2 MG/2ML IJ SOLN
1.0000 mg | INTRAMUSCULAR | Status: DC | PRN
  Administered 2022-06-15 (×2): 2 mg via INTRAVENOUS
  Administered 2022-06-15: 1 mg via INTRAVENOUS
  Administered 2022-06-16: 2 mg via INTRAVENOUS
  Administered 2022-06-16: 1 mg via INTRAVENOUS
  Administered 2022-06-16: 2 mg via INTRAVENOUS
  Filled 2022-06-14 (×7): qty 2

## 2022-06-14 MED ORDER — FOLIC ACID 1 MG PO TABS
1.0000 mg | ORAL_TABLET | Freq: Every day | ORAL | Status: DC
  Administered 2022-06-14 – 2022-06-16 (×3): 1 mg
  Filled 2022-06-14 (×3): qty 1

## 2022-06-14 MED ORDER — OXYCODONE HCL 5 MG PO TABS
5.0000 mg | ORAL_TABLET | Freq: Four times a day (QID) | ORAL | Status: DC | PRN
  Administered 2022-06-16: 5 mg
  Filled 2022-06-14: qty 1

## 2022-06-14 MED ORDER — SODIUM PHOSPHATES 45 MMOLE/15ML IV SOLN
30.0000 mmol | Freq: Once | INTRAVENOUS | Status: AC
  Administered 2022-06-14: 30 mmol via INTRAVENOUS
  Filled 2022-06-14: qty 10

## 2022-06-14 MED ORDER — SODIUM CHLORIDE 0.9 % IV SOLN: 250.0000 mL | INTRAVENOUS | Status: DC

## 2022-06-14 MED ORDER — PROPOFOL 1000 MG/100ML IV EMUL
5.0000 ug/kg/min | INTRAVENOUS | Status: DC
  Administered 2022-06-14: 70 ug/kg/min via INTRAVENOUS
  Administered 2022-06-14: 50 ug/kg/min via INTRAVENOUS
  Administered 2022-06-14: 50.037 ug/kg/min via INTRAVENOUS
  Administered 2022-06-14: 5 ug/kg/min via INTRAVENOUS
  Administered 2022-06-15: 50 ug/kg/min via INTRAVENOUS
  Administered 2022-06-15 – 2022-06-16 (×3): 60 ug/kg/min via INTRAVENOUS
  Administered 2022-06-16: 70 ug/kg/min via INTRAVENOUS
  Filled 2022-06-14 (×9): qty 100

## 2022-06-14 MED ORDER — DOCUSATE SODIUM 50 MG/5ML PO LIQD
100.0000 mg | Freq: Two times a day (BID) | ORAL | Status: DC
  Administered 2022-06-14: 100 mg
  Filled 2022-06-14 (×2): qty 10

## 2022-06-14 MED ORDER — SODIUM CHLORIDE 0.9 % IV SOLN
500.0000 mg | INTRAVENOUS | Status: DC
  Administered 2022-06-14 – 2022-06-15 (×2): 500 mg via INTRAVENOUS
  Filled 2022-06-14 (×2): qty 5

## 2022-06-14 MED ORDER — MAGNESIUM SULFATE 2 GM/50ML IV SOLN
2.0000 g | Freq: Once | INTRAVENOUS | Status: AC
  Administered 2022-06-14: 2 g via INTRAVENOUS
  Filled 2022-06-14: qty 50

## 2022-06-14 MED ORDER — BUDESONIDE 0.5 MG/2ML IN SUSP
0.5000 mg | Freq: Two times a day (BID) | RESPIRATORY_TRACT | Status: DC
  Administered 2022-06-14 – 2022-06-16 (×4): 0.5 mg via RESPIRATORY_TRACT
  Filled 2022-06-14 (×5): qty 2

## 2022-06-14 MED ORDER — PROSOURCE TF20 ENFIT COMPATIBL EN LIQD
60.0000 mL | Freq: Every day | ENTERAL | Status: DC
  Administered 2022-06-14 – 2022-06-16 (×3): 60 mL
  Filled 2022-06-14 (×3): qty 60

## 2022-06-14 MED ORDER — HEPARIN SODIUM (PORCINE) 5000 UNIT/ML IJ SOLN
5000.0000 [IU] | Freq: Three times a day (TID) | INTRAMUSCULAR | Status: DC
  Administered 2022-06-14 – 2022-06-16 (×5): 5000 [IU] via SUBCUTANEOUS
  Filled 2022-06-14 (×5): qty 1

## 2022-06-14 MED ORDER — SUCCINYLCHOLINE CHLORIDE 200 MG/10ML IV SOSY
PREFILLED_SYRINGE | INTRAVENOUS | Status: AC
  Filled 2022-06-14: qty 10

## 2022-06-14 MED ORDER — DEXMEDETOMIDINE HCL IN NACL 400 MCG/100ML IV SOLN
0.4000 ug/kg/h | INTRAVENOUS | Status: DC
  Administered 2022-06-14: 0.5 ug/kg/h via INTRAVENOUS
  Filled 2022-06-14: qty 100

## 2022-06-14 MED ORDER — FENTANYL CITRATE PF 50 MCG/ML IJ SOSY
25.0000 ug | PREFILLED_SYRINGE | Freq: Once | INTRAMUSCULAR | Status: AC
  Administered 2022-06-14: 25 ug via INTRAVENOUS

## 2022-06-14 MED ORDER — ACETAMINOPHEN 325 MG PO TABS: 650.0000 mg | ORAL_TABLET | Freq: Four times a day (QID) | ORAL | Status: DC | PRN

## 2022-06-14 MED ORDER — ETOMIDATE 2 MG/ML IV SOLN
20.0000 mg | Freq: Once | INTRAVENOUS | Status: AC
  Administered 2022-06-14: 20 mg via INTRAVENOUS

## 2022-06-14 MED ORDER — FENTANYL 2500MCG IN NS 250ML (10MCG/ML) PREMIX INFUSION
25.0000 ug/h | INTRAVENOUS | Status: DC
  Administered 2022-06-14: 25 ug/h via INTRAVENOUS
  Administered 2022-06-15: 175 ug/h via INTRAVENOUS
  Administered 2022-06-15 – 2022-06-16 (×2): 200 ug/h via INTRAVENOUS
  Filled 2022-06-14 (×4): qty 250

## 2022-06-14 MED ORDER — FENTANYL CITRATE PF 50 MCG/ML IJ SOSY
50.0000 ug | PREFILLED_SYRINGE | INTRAMUSCULAR | Status: DC | PRN
  Administered 2022-06-14: 50 ug via INTRAVENOUS
  Filled 2022-06-14: qty 1

## 2022-06-14 MED ORDER — ETOMIDATE 2 MG/ML IV SOLN
INTRAVENOUS | Status: AC
  Filled 2022-06-14: qty 10

## 2022-06-14 MED ORDER — POLYETHYLENE GLYCOL 3350 17 G PO PACK
17.0000 g | PACK | Freq: Every day | ORAL | Status: DC
  Filled 2022-06-14 (×2): qty 1

## 2022-06-14 MED ORDER — OXYBUTYNIN CHLORIDE 5 MG PO TABS
5.0000 mg | ORAL_TABLET | Freq: Every day | ORAL | Status: DC
  Administered 2022-06-14 – 2022-06-16 (×3): 5 mg
  Filled 2022-06-14 (×3): qty 1

## 2022-06-14 MED ORDER — DOXYCYCLINE HYCLATE 100 MG PO TABS
100.0000 mg | ORAL_TABLET | Freq: Two times a day (BID) | ORAL | Status: DC
  Administered 2022-06-14: 100 mg
  Filled 2022-06-14: qty 1

## 2022-06-14 MED ORDER — REVEFENACIN 175 MCG/3ML IN SOLN
175.0000 ug | Freq: Every day | RESPIRATORY_TRACT | Status: DC
  Administered 2022-06-15 – 2022-06-16 (×2): 175 ug via RESPIRATORY_TRACT
  Filled 2022-06-14 (×3): qty 3

## 2022-06-14 MED ORDER — ACETAMINOPHEN 650 MG RE SUPP: 650.0000 mg | Freq: Four times a day (QID) | RECTAL | Status: DC | PRN

## 2022-06-14 MED ORDER — ARFORMOTEROL TARTRATE 15 MCG/2ML IN NEBU
15.0000 ug | INHALATION_SOLUTION | Freq: Two times a day (BID) | RESPIRATORY_TRACT | Status: DC
  Administered 2022-06-14 – 2022-06-16 (×4): 15 ug via RESPIRATORY_TRACT
  Filled 2022-06-14 (×5): qty 2

## 2022-06-14 MED ORDER — PROPOFOL 1000 MG/100ML IV EMUL: 5.0000 ug/kg/min | INTRAVENOUS | Status: DC

## 2022-06-14 MED ORDER — TRAZODONE HCL 50 MG PO TABS: 150.0000 mg | ORAL_TABLET | Freq: Every day | ORAL | Status: DC

## 2022-06-14 MED ORDER — SACCHAROMYCES BOULARDII 250 MG PO CAPS
250.0000 mg | ORAL_CAPSULE | Freq: Two times a day (BID) | ORAL | Status: DC
  Administered 2022-06-14 – 2022-06-16 (×5): 250 mg
  Filled 2022-06-14 (×5): qty 1

## 2022-06-14 MED ORDER — FENTANYL BOLUS VIA INFUSION
25.0000 ug | INTRAVENOUS | Status: DC | PRN
  Administered 2022-06-14: 100 ug via INTRAVENOUS
  Administered 2022-06-14: 25 ug via INTRAVENOUS
  Administered 2022-06-14: 50 ug via INTRAVENOUS
  Administered 2022-06-14: 100 ug via INTRAVENOUS
  Administered 2022-06-14 (×3): 25 ug via INTRAVENOUS
  Administered 2022-06-14 (×2): 50 ug via INTRAVENOUS
  Administered 2022-06-15: 100 ug via INTRAVENOUS
  Administered 2022-06-15: 50 ug via INTRAVENOUS
  Administered 2022-06-15 – 2022-06-16 (×19): 100 ug via INTRAVENOUS

## 2022-06-14 MED ORDER — MIRTAZAPINE 15 MG PO TABS
15.0000 mg | ORAL_TABLET | Freq: Every day | ORAL | Status: DC
  Administered 2022-06-14 – 2022-06-15 (×2): 15 mg
  Filled 2022-06-14 (×2): qty 1

## 2022-06-14 MED ORDER — MIDAZOLAM HCL 2 MG/2ML IJ SOLN
1.0000 mg | INTRAMUSCULAR | Status: DC | PRN
  Administered 2022-06-14: 1 mg via INTRAVENOUS
  Filled 2022-06-14: qty 2

## 2022-06-14 MED ORDER — FAMOTIDINE 20 MG PO TABS
20.0000 mg | ORAL_TABLET | Freq: Every day | ORAL | Status: DC
  Administered 2022-06-14 – 2022-06-16 (×3): 20 mg
  Filled 2022-06-14 (×4): qty 1

## 2022-06-14 MED ORDER — CHLORHEXIDINE GLUCONATE CLOTH 2 % EX PADS
6.0000 | MEDICATED_PAD | Freq: Every day | CUTANEOUS | Status: DC
  Administered 2022-06-14 – 2022-06-16 (×3): 6 via TOPICAL

## 2022-06-14 MED ORDER — DIPHENOXYLATE-ATROPINE 2.5-0.025 MG PO TABS: 1.0000 | ORAL_TABLET | Freq: Three times a day (TID) | ORAL | Status: DC | PRN

## 2022-06-14 MED ORDER — SODIUM CHLORIDE 0.9 % IV SOLN
2.0000 g | INTRAVENOUS | Status: DC
  Administered 2022-06-14 – 2022-06-15 (×2): 2 g via INTRAVENOUS
  Filled 2022-06-14 (×2): qty 20

## 2022-06-14 MED ORDER — SODIUM CHLORIDE 0.9 % IV SOLN: INTRAVENOUS | Status: DC

## 2022-06-14 MED ORDER — TIZANIDINE HCL 4 MG PO TABS
4.0000 mg | ORAL_TABLET | Freq: Two times a day (BID) | ORAL | Status: DC | PRN
  Administered 2022-06-15: 4 mg
  Filled 2022-06-14: qty 1

## 2022-06-14 MED ORDER — VITAL HIGH PROTEIN PO LIQD
1000.0000 mL | ORAL | Status: DC
  Administered 2022-06-14 – 2022-06-15 (×2): 1000 mL

## 2022-06-14 MED ORDER — TIZANIDINE HCL 2 MG PO TABS: 4.0000 mg | ORAL_TABLET | Freq: Two times a day (BID) | ORAL | Status: DC | PRN

## 2022-06-14 MED ORDER — ETOMIDATE 2 MG/ML IV SOLN: 20.0000 mg | Freq: Once | INTRAVENOUS | Status: DC

## 2022-06-14 MED ORDER — SUCCINYLCHOLINE CHLORIDE 200 MG/10ML IV SOSY
75.0000 mg | PREFILLED_SYRINGE | Freq: Once | INTRAVENOUS | Status: AC
  Administered 2022-06-14: 75 mg via INTRAVENOUS

## 2022-06-14 MED ORDER — SODIUM CHLORIDE 0.9 % IV BOLUS
500.0000 mL | Freq: Once | INTRAVENOUS | Status: AC
  Administered 2022-06-14: 500 mL via INTRAVENOUS

## 2022-06-14 MED ORDER — AMLODIPINE BESYLATE 5 MG PO TABS: 10.0000 mg | ORAL_TABLET | Freq: Every day | ORAL | Status: DC

## 2022-06-14 MED ORDER — SUCCINYLCHOLINE CHLORIDE 200 MG/10ML IV SOSY: 75.0000 mg | PREFILLED_SYRINGE | Freq: Once | INTRAVENOUS | Status: DC

## 2022-06-14 MED ORDER — SODIUM CHLORIDE 0.9 % IV BOLUS
1000.0000 mL | Freq: Once | INTRAVENOUS | Status: AC
  Administered 2022-06-14: 1000 mL via INTRAVENOUS

## 2022-06-14 MED ORDER — PROPOFOL 1000 MG/100ML IV EMUL
INTRAVENOUS | Status: AC
  Administered 2022-06-14: 15 ug/kg/min via INTRAVENOUS
  Filled 2022-06-14: qty 100

## 2022-06-14 MED ORDER — NOREPINEPHRINE 4 MG/250ML-% IV SOLN
2.0000 ug/min | INTRAVENOUS | Status: DC
  Administered 2022-06-14 – 2022-06-15 (×2): 2 ug/min via INTRAVENOUS
  Filled 2022-06-14 (×2): qty 250

## 2022-06-14 MED ORDER — ORAL CARE MOUTH RINSE
15.0000 mL | OROMUCOSAL | Status: DC
  Administered 2022-06-14 – 2022-06-16 (×20): 15 mL via OROMUCOSAL

## 2022-06-14 MED ORDER — ORAL CARE MOUTH RINSE: 15.0000 mL | OROMUCOSAL | Status: DC | PRN

## 2022-06-14 NOTE — Progress Notes (Addendum)
Progress Note  RN called due to patient's respiratory rate in 30s to 40s, she was on BiPAP at FiO2 of 50%, and was having shallow breaths as well as being lethargic.  ABG was ordered and showed pH 7.2, pCO2 118, pO2 75, bicarb 46.7, O2 sat 97.4   Patient was difficult to arouse and unable to protect airway, she was tachypneic, tachycardic and in respiratory distress.  It was decided for patient to be intubated, ED physician, Dr. Blinda Leatherwood was asked to help with intubation and this was graciously done after explaining the procedure and obtaining consent from patient's husband.  ED physician consult appreciated. Chest x-ray done showed new infiltrate at the left lung base.  COPD and postoperative right apex Repeated ABG about 2 hours after intubation showed: ABG    Component Value Date/Time   PHART 7.39 06/14/2022 0603   PCO2ART 76 (HH) 06/14/2022 0603   PO2ART 241 (H) 06/14/2022 0603   HCO3 46.8 (H) 06/14/2022 0603   ACIDBASEDEF 5.1 (H) 10/11/2020 0330   O2SAT 99.9 06/14/2022 0603     Physical Exam BP 97/68   Pulse (!) 122   Temp 99.9 F (37.7 C) (Axillary)   Resp (!) 21   Wt 44.9 kg   SpO2 94%   BMI 17.53 kg/m   Gen:-Patient was intubated and sedated HEENT:- [place].AT, No sclera icterus Neck-Supple Neck,No JVD,.  Lungs-decreased breath sounds, tachypnea CV-tachycardia S1, S2 normal Abd-  +ve B.Sounds, Abd Soft, No tenderness,    Extremity/Skin:- No  edema, skin warm and dry Psych-this cannot be obtained at this time due to patient being intubated and sedated Neuro-no new focal deficits, no tremors  Assessment and plan  Acute on chronic respiratory failure with hypoxia and hypercapnia due to COPD exacerbation Patient is now intubated and sedated due to being lethargic, presented with worsening hypercapnia and unable to protect airway despite being on BiPAP Continue propofol and Versed as needed Continue Levophed with goal to wean patient off this as tolerated Continue daily  trial weaning off BIPAP Continue daily chest x-ray while being intubated Monitor new infiltrate noted on chest x-ray to ensure the infiltrate is not due to aspiration that could lead to aspiration pneumonia.  Procalcitonin will be done Consider surgical consult if patient should require central line placement  Critical time: 37 minutes   Critical care personally provided  managing the patient due to high probability of clinically significant and life threatening deterioration. This critical care time included obtaining a history; examining the patient, pulse oximetry; ordering and review of studies; arranging urgent treatment with development of a management plan; evaluation of patient's response of treatment; frequent reassessment; and discussions with other providers.  This critical care time was performed to assess and manage the high probability of imminent and life threatening deterioration that could result in multi-organ failure.

## 2022-06-14 NOTE — Progress Notes (Signed)
Patient was intubated with settings at 410/ R16/ 100%/ +5.  ABG drawn with the following results:   Latest Reference Range & Units 06/14/22 06:03  pH, Arterial 7.35 - 7.45  7.39  pCO2 arterial 32 - 48 mmHg 76 (HH)  pO2, Arterial 83 - 108 mmHg 241 (H)  Acid-Base Excess 0.0 - 2.0 mmol/L 17.2 (H)  Bicarbonate 20.0 - 28.0 mmol/L 46.8 (H)  (HH): Data is critically high (H): Data is abnormally high  Turned FIO2 down to 40% per ABG result.  RN aware.

## 2022-06-14 NOTE — IPAL (Signed)
  Interdisciplinary Goals of Care Family Meeting   Date carried out:: 06/14/2022  Location of the meeting: Unit  Member's involved: Physician, Nurse Practitioner, and Family Member or next of kin  Durable Power of Attorney or acting medical decision maker: Husband, Toriah Liverpool.   Discussion: We discussed goals of care for Jasmine [name] .  She was transferred from [facility] with acute on chronic hypoxic/hypercapnic respiratory failure, COPD exacerbation, and pneumonia.  She has multiple admissions in the past several months.  She has been getting progressively weaker.  Her husband wants to continue current therapies with the hope that she can recover.  He has expressed that she would not want tracheostomy and long term ventilator support.  Also, she would not want CPR and cardiac resuscitation in the event of cardiac arrest.  Code status: Full DNR  Disposition: Continue current acute care   Time spent for the meeting: 14 minutes  Marica Trentham 06/14/2022, 2:43 PM

## 2022-06-14 NOTE — Progress Notes (Signed)
Palliative-   Consult received, chart reviewed.  Case discussed with Dr. Gwenlyn Perking. Patient intubated early this morning. Her sister and her aunt are at bedside. Patient has a spouse who is legal Runner, broadcasting/film/video.  Reviewed Palliative Medicine information with sister and aunt. Answered their questions re: Palliative vs Hospice.  Called patient's husband, [name].  Plan to have family meeting at 3pm this afternoon.   Ocie Bob, AGNP-C Palliative Medicine  No charge

## 2022-06-14 NOTE — Progress Notes (Signed)
Palliative-   Notified that patient being transferred to [facility].  Spoke with patient's spouse. He would prefer to meet with Palliative provider at [facility]. Dr. Patterson Hammersmith is aware and will follow up with him with a time for meeting.   Ocie Bob, AGNP-C Palliative Medicine  No charge

## 2022-06-14 NOTE — Progress Notes (Signed)
PROGRESS NOTE     Jasmine [name], is a 67 y.o. female, DOB - 02-20-1956, WNU:[id number]  Admit date - 07/02/2022   Admitting Physician Courage Mariea ClontsEmokpae, MD  Outpatient Primary MD for the patient is Raliegh IpGottschalk, Ashly M, DO  LOS - 2  Chief Complaint  Patient presents with   Shortness of Breath      Brief Narrative:  past medical history relevant for history of lung cancer with status post right upper lobe lobectomy, COPD and ongoing tobacco abuse, chronic respiratory failure on 6-7 L, chronic opioid dependence secondary to chronic back pain, hypertension, hyperlipidemia,nephrolithiasis, colon polyps (TA's), GERD, idiopathic pancreatitis with chronic diarrhea, abnormal pancreas imaging with dilated duct, chronically dilated bile duct was recently discharged from the hospital on 06/06/2022 after treatment for respiratory infection/COPD readmitted on 06/13/2022 with acute on chronic hypoxic respiratory failure secondary to COPD exacerbation requiring BiPAP initially and subsequently requiring to be intubated and mechanically ventilated.   -Assessment and Plan:  1)Acute COPD Exacerbation with bronchiectasis- no definite pneumonia currently present. -Continue IV steroids, mucolytic's, doxycycline and as needed bronchodilators. -Pulmicort and Brovana has also been added to patient's regimen -Currently mechanically ventilated -Pulmonology/PCCM involved in patient's case with recommendation [facility] and management.   -Patient currently intubated and mechanically ventilated -Patient sees Dr. Sherene SiresWert as outpatient   2) acute on chronic hypoxic/hypercapnic respiratory failure--at baseline patient uses 6 to 7 L of oxygen via nasal cannula even on a good day --ABG: pH 7.2, CO2 118, pO2 75 and bicarb 46.7 -Suspecting some degree of chronic respiratory acidosis with compensatory metabolic alkalosis -Patient currently intubated and mechanically  ventilated. -Repeated ABG after ventilatory support for 2 hours demonstrating FiO2 100%, pH 7.39, CO2 76 pO2 241, bicarb 46.8 -Suspect some degree of hypercapnia at baseline   3) squamous cell lung cancer with status post right upper lobe lobectomy---- -continue to follow-up with Dr. Sherene SiresWert as outpatient   4)-Colitis versus diarrhea due to chronic pancreatitis  Diarrhea thought to be due to either chronic pancreatitis or colitis.  Only mild wall thickening was noted on CT scan.  No stool studies were sent.   Patient mentions that she was diagnosed with C. difficile about 6 months ago.  The only positive C. difficile test available in our system is from September 2022. No results in Care Everywhere. Does not appear to be infectious since this has been ongoing for several weeks.  She is afebrile and WBC is normal. -Chronic changes on abdominal CT in regards to her pancreas.  --Probiotics and as needed antidiarrheal agents advised -Continue the use of Creon -Continue outpatient follow-up with gastroenterology. -Once tolerating p.o.'s Florastor can be also added.    5)Chronic pain syndrome/chronic back pain -Continue to be judicious with pain meds  -PRNs fentanyl provided, in effort to prevent withdrawals from been unable to take chronic oral pain management   6)HTN/hypotension---- -patient with history of hypertension chronically using amlodipine as an outpatient -After sedation implemented from mechanical ventilation blood pressure has been soft and in need to require pressor support. -Continue to wean off pressors as tolerated -Amlodipine discontinued at the moment -Continue to follow vital signs.   7)Severe protein calorie malnutrition- -Body mass index is 17.53 kg/m.  Nutrition Problem: Severe Malnutrition Etiology: chronic illness  -Continue feeding supplements.  Food by nutritional service.   8)Social/ethics--- recurrent admission for respiratory problems -Overall prognosis is  poor, currently guarded. -Palliative care has been consulted and goals of care discussion currently in  process. -Patient remains a full code for now. -Advanced directive discussed with patient and husband  Other/Update 06/14/22 -Patient respiratory status decompensated despite the use of BiPAP and in the requiring to be mechanically intubated.  Case has been discussed with Dr. Katrinka Blazing (PCCM) and decision made for patient to be transferred to [facility] for further evaluation and management.  Status is: Inpatient   CRITICAL CARE Performed by: Vassie Loll   Total critical care time: 60 minutes  Critical care time was exclusive of separately billable procedures and treating other patients.  Critical care was necessary to treat or prevent imminent or life-threatening deterioration.  Critical care was time spent personally by me on the following activities: development of treatment plan with patient and/or surrogate as well as nursing, discussions with consultants, evaluation of patient's response to treatment, examination of patient, obtaining history from patient or surrogate, ordering and performing treatments and interventions, ordering and review of laboratory studies, ordering and review of radiographic studies, pulse oximetry and re-evaluation of patient's condition.   Disposition: The patient is from: Home              Anticipated d/c is to: Home              Anticipated d/c date is: To be determined.  Patient will be transferred to [facility] for further evaluation and management by PCCM.               Patient currently is not medically stable to d/c.  Barriers: Not Clinically Stable-   Code Status :  -  Code Status: Full Code   Family Communication:    Discussed with husband at bedside  DVT Prophylaxis  :   - SCDs   heparin injection 5,000 Units Start: 06/17/2022 1800 SCDs Start: 06/28/2022 1548 Place TED hose Start: 06/08/2022 1548   Lab Results  Component  Value Date   PLT 323 06/29/2022    Inpatient Medications  Scheduled Meds:  Chlorhexidine Gluconate Cloth  6 each Topical Nightly   doxycycline  100 mg Per Tube Q12H   famotidine  20 mg Per Tube Daily   folic acid  1 mg Per Tube Daily   heparin  5,000 Units Subcutaneous Q8H   ipratropium-albuterol  3 mL Nebulization Q6H   methylPREDNISolone (SOLU-MEDROL) injection  40 mg Intravenous Q12H   mirtazapine  15 mg Per Tube QHS   oxybutynin  5 mg Per Tube Daily   saccharomyces boulardii  250 mg Per Tube BID   sodium chloride flush  3 mL Intravenous Q12H   sodium chloride flush  3 mL Intravenous Q12H   traZODone  150 mg Per Tube QHS   Continuous Infusions:  sodium chloride     sodium chloride     norepinephrine (LEVOPHED) Adult infusion 5 mcg/min (06/14/22 0800)   propofol (DIPRIVAN) infusion 70 mcg/kg/min (06/14/22 1043)   PRN Meds:.sodium chloride, acetaminophen **OR** acetaminophen, albuterol, bisacodyl, diphenoxylate-atropine, fentaNYL (SUBLIMAZE) injection, midazolam, ondansetron **OR** ondansetron (ZOFRAN) IV, oxyCODONE, prochlorperazine, sodium chloride flush, tiZANidine   Anti-infectives (From admission, onward)    Start     Dose/Rate Route Frequency Ordered Stop   06/14/22 1045  doxycycline (VIBRA-TABS) tablet 100 mg        100 mg Per Tube Every 12 hours 06/14/22 0958     06/13/22 1415  doxycycline (VIBRA-TABS) tablet 100 mg  Status:  Discontinued        100 mg Oral Every 12 hours 06/13/22 1315 06/14/22 0958  Subjective: Jasmine Medin patient with decompensated respiratory status overnight developing severe hypercapnia with CO2 up to 118.  Patient respiratory rate in the mid 30s to 50s range.    Mechanically intubated around 6 AM today.  Objective: Vitals:   06/14/22 0745 06/14/22 0756 06/14/22 0817 06/14/22 1119  BP: 93/64  97/68   Pulse: (!) 124 (!) 124 (!) 122 (!) 124  Resp: (!) 31 20 (!) 21 (!) 22  Temp:   99.9 F (37.7 C)   TempSrc:   Axillary    SpO2: 92% 93% 94%   Weight:        Intake/Output Summary (Last 24 hours) at 06/14/2022 1132 Last data filed at 06/14/2022 0800 Gross per 24 hour  Intake 570.28 ml  Output 1200 ml  Net -629.72 ml   Filed Weights   06/13/22 0423 06/14/22 0625  Weight: 45.3 kg 44.9 kg    Physical Exam General exam: Sedated, intubated and mechanically ventilated; currently not following commands.  FiO2 40% and PEEP of 5.  Chronically ill and underweight in appearance. Respiratory system: Decreased air movement bilaterally; positive rhonchi; respiratory rate 19 currently mechanically ventilated. Cardiovascular system: Sinus tachycardia, no rubs, no gallops, no JVD on exam. Gastrointestinal system: Abdomen is nondistended, soft and nontender. No organomegaly or masses felt. Normal bowel sounds heard. Central nervous system: Unable to properly assess given current sedation level. Extremities: No cyanosis or edema appreciated. Skin: No petechiae. Psychiatry: Unable to properly assess given current sedation status.  Data Reviewed: I have personally reviewed following labs and imaging studies  CBC: Recent Labs  Lab 06/24/2022 0640  WBC 10.0  NEUTROABS 7.3  HGB 8.9*  HCT 31.4*  MCV 115.9*  PLT 323   Basic Metabolic Panel: Recent Labs  Lab 06/07/2022 0640 06/13/22 0534  NA 135 135  K 4.3 4.1  CL 94* 97*  CO2 35* 30  GLUCOSE 129* 132*  BUN 12 11  CREATININE 0.53 0.34*  CALCIUM 8.5* 8.4*   GFR: Estimated Creatinine Clearance: 47.7 mL/min (A) (by C-G formula based on SCr of 0.34 mg/dL (L)).  Recent Results (from the past 240 hour(s))  MRSA Next Gen by PCR, Nasal     Status: None   Collection Time: 06/23/2022  3:40 PM   Specimen: Nasal Mucosa; Nasal Swab  Result Value Ref Range Status   MRSA by PCR Next Gen NOT DETECTED NOT DETECTED Final    Comment: (NOTE) The GeneXpert MRSA Assay (FDA approved for NASAL specimens only), is one component of a comprehensive MRSA colonization  surveillance program. It is not intended to diagnose MRSA infection nor to guide or monitor treatment for MRSA infections. Test performance is not FDA approved in patients less than 70 years old. Performed at [facility], [address] [place], [place] [address]    Radiology Studies: [facility] 1 View  Result Date: 06/14/2022 CLINICAL DATA:  Intubation. EXAM: PORTABLE CHEST 1 VIEW COMPARISON:  Two days ago FINDINGS: Endotracheal tube with tip just below the clavicular heads. The enteric tube tip and side-port reaches the stomach. Postoperative lungs with volume loss and opacity at the right upper lobe. Infiltrate at the left lung base which is new. Chronic lung disease with diaphragm flattening. Normal heart size. Spinal cord stimulator at midthoracic levels. IMPRESSION: 1. Unremarkable hardware. 2. New infiltrate at the left lung base. 3. COPD and postoperative right apex. Electronically Signed   By: Tiburcio Pea M.D.   On: 06/14/2022 05:08    Scheduled Meds:  Chlorhexidine Gluconate  Cloth  6 each Topical Nightly   doxycycline  100 mg Per Tube Q12H   famotidine  20 mg Per Tube Daily   folic acid  1 mg Per Tube Daily   heparin  5,000 Units Subcutaneous Q8H   ipratropium-albuterol  3 mL Nebulization Q6H   methylPREDNISolone (SOLU-MEDROL) injection  40 mg Intravenous Q12H   mirtazapine  15 mg Per Tube QHS   oxybutynin  5 mg Per Tube Daily   saccharomyces boulardii  250 mg Per Tube BID   sodium chloride flush  3 mL Intravenous Q12H   sodium chloride flush  3 mL Intravenous Q12H   traZODone  150 mg Per Tube QHS   Continuous Infusions:  sodium chloride     sodium chloride     norepinephrine (LEVOPHED) Adult infusion 5 mcg/min (06/14/22 0800)   propofol (DIPRIVAN) infusion 70 mcg/kg/min (06/14/22 1043)    LOS: 2 days   Vassie Loll M.D on 06/14/2022   Go to [email or web address] - for contact info  [facility] - Office  [phone]  If 7PM-7AM, please contact  night-coverage [email or web address] 06/14/2022, 11:32 AM

## 2022-06-14 NOTE — Progress Notes (Signed)
Adding new wound notes to prior charting. Wound are now changed to stage 2 from prior charting

## 2022-06-14 NOTE — Progress Notes (Signed)
Pt's respiratory rate sustaining to high 30s to mid 40s, still on BIPAP 50% now. (Had to increase it from 40% since she had a desaturation episode low as 80% around 0059h). Informed RT and MD.   Pt's RR still sustaining 40s at 0409h, paged MD, ABG ordered and results relayed to MD. RT tried working on the BIPAP and changed settings. Called pt's husband to verify code status and informed regarding possible intubation. As per husband, still full code. ED doc came, proceeded with intubation, ET 7.5 lip level 23  Etomidate-0439h Succs- 0440h intubation- 0441h OG- 54cm  Dex started. BP dropped and pt still agitated with max dose. Prof started. BP dropped. Started on Levo. Titrated. ABG done and RT adjusted vent settings accordingly. Kept monitored. Will endorsed to upcoming nurse.

## 2022-06-14 NOTE — Consult Note (Signed)
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  Patient had presenting to [facility] on 4 8 for management of increasing shortness of breath and despite aggressive medical management for acute hypoxic/hypercarbic respiratory failure in the setting of COPD/bronchiectasis exacerbation and PNA, continue to deteriorate requiring BiPAP support and then intubation.  Currently patient in ICU receiving antibiotics support, pressors for hypotension management, and sedation while on ventilator support.  Palliative medicine team consulted to assist with complex medical decision making and psychosocial support.  Extensive review of EMR prior to seeing patient.  Palliative medicine team has been consulted to [facility] though had not had a chance to meet with family yet.  Discussed care with palliative medicine provider over at [facility] for medical updates regarding patient's care.  Presented to bedside upon patient's arrival.  Patient ill-appearing, sedated on ventilator support.  Patient's sisters presenting to the ICU so introduced myself and the role of the palliative medicine team in patient's care.  Awaiting patient's husband to present to bedside as well for further discussion.  Noted the medicine team would continue to follow-up later in the day.  Later informed that patient's husband had presented to the  ICU waiting room. Dr. Craige Cotta updated me on the conversation he had with them. Patient was now DNR, continue appropraite care with plan for time limited trial. Husband told Dr. Craige Cotta patient would not want tracheostomy or long term ventilator support.   Presented to ICU waiting room to meet with family.  Present was patient's husband, Fayrene Fearing (goes by [name]),  and patient's sister Meriam Sprague and friend.  Introduced myself and the role of the palliative medicine team in patient's care.  Then spent time providing emotional support via active listening.  Able to hear about patient's life at home.  Patient has essentially been minimally mobile for the past year.  Patient has been staying in a recliner because she gets incredibly short of breath even walking to the bathroom.  Patient only went out for her once monthly chronic pain medication appointments.  Husband described that patient's physical status has been greatly deteriorating due to her feelings of shortness of breath.  Patient did enjoy reading, watching game shows, and listening to country music at home with him.  Husband was essentially caregiver for her.  Spent time reviewing patient's medical history as well and progression of her underlying lung disease.  Family felt ICU provider had greatly detailed plans for medical care moving forward.  Did spend time answering questions regarding patient's medical care and ventilator management.  Discussed possible pathways for medical care moving forward as well.  While hoping patient's medical status will improve to the point of being able to come off the ventilator to support her own breathing, unknown if this will be possible at this time.  Discussed importance of monitoring patient over the next 24 hours.  Family inquiring about prognosis and what would happen  if patient is unable to support her own breathing as she would not want a tracheostomy.  With their permission, discussed idea of a palliative extubation and what  this would entail such as aggressive symptom management at the end of life.  Discussed if resulted in palliative extubation, normally looking at hours to days.  At this time patient is on medication to artificially keep her blood pressure up which is worrisome so noted if this continues to worsen, again looking at time being short.  Spent time providing emotional support.  Family appropriately tearful during discussion.  Did offer spiritual support as well and noted that chaplains are present 24/7 if needed for spiritual support.  Answered all questions at that time.  Noted palliative medicine team would continue to follow along to support discussions and pathways for medical care moving forward.  Family voiced appreciation for visit and discussion today.  Primary Diagnoses  Present on Admission:  COPD with acute exacerbation  Cigarette smoker  Chronic pancreatitis  Essential hypertension  Hepatic steatosis   Palliative Review of Systems: Patient intubated and sedated  Past Medical History:  Diagnosis Date   Allergy    Anxiety    Arthritis    Asthma    Carpal tunnel syndrome    Chronic back pain    Cigarette smoker 08/25/2017   Complication of anesthesia    in the past has had N/V, the last few surgeries she has not been   COPD (chronic obstructive pulmonary disease)    GERD (gastroesophageal reflux disease)    Heart murmur 2005   never had any problems   History of blood transfusion 2018   History of kidney stones    Hypertension    Iron deficiency anemia 07/11/2016   Neuropathy    both arms   Normocytic anemia 07/29/2015   Pancreatitis    Pneumonia    PONV (postoperative nausea and vomiting)    Postoperative anemia due to acute blood loss 11/15/2015   R lung cancer    Sciatica    Seizures    as a child when she would have an asthma attack. none as an adult   Social History   Socioeconomic History   Marital status: Married    Spouse name: [name]   Number of children:  1   Years of education: Not on file   Highest education level: Some college, no degree  Occupational History   Occupation: disability  Tobacco Use   Smoking status: Every Day    Packs/day: 0.25    Years: 35.00    Additional pack years: 0.00    Total pack years: 8.75    Types: Cigarettes   Smokeless tobacco: Never   Tobacco comments:    trying to quit  Vaping Use   Vaping Use: Never used  Substance and Sexual Activity   Alcohol use: Yes    Alcohol/week: 2.0 standard drinks of alcohol    Types: 2 Glasses of wine per week    Comment: occasionally   Drug use: No   Sexual activity: Yes    Birth control/protection: None  Other Topics Concern   Not on file  Social History Narrative   Retired, lives at home with husband [name] and mother in Social worker. 1 son, deceased. 2 pet dogs. Enjoys watching TV.   Husband takes care of cooking, cleaning, driving, and helps her with ADLs including bathing   Social Determinants of Health   Financial Resource Strain: Low Risk  (04/10/2022)   Overall Financial  Resource Strain (CARDIA)    Difficulty of Paying Living Expenses: Not hard at all  Food Insecurity: No Food Insecurity (04/10/2022)   Hunger Vital Sign    Worried About Running Out of Food in the Last Year: Never true    Ran Out of Food in the Last Year: Never true  Transportation Needs: No Transportation Needs (04/10/2022)   PRAPARE - Administrator, Civil Service (Medical): No    Lack of Transportation (Non-Medical): No  Physical Activity: Inactive (04/10/2022)   Exercise Vital Sign    Days of Exercise per Week: 0 days    Minutes of Exercise per Session: 0 min  Stress: No Stress Concern Present (04/10/2022)   [employer] of Occupational Health - Occupational Stress Questionnaire    Feeling of Stress : Not at all  Social Connections: Moderately Integrated (04/10/2022)   Social Connection and Isolation Panel [NHANES]    Frequency of Communication with Friends and Family: More than three  times a week    Frequency of Social Gatherings with Friends and Family: More than three times a week    Attends Religious Services: 1 to 4 times per year    Active Member of [employer] or Organizations: No    Attends Engineer, structural: Never    Marital Status: Married   Family History  Problem Relation Age of Onset   Heart disease Mother    Diabetes Mother    Hypertension Mother    Hyperlipidemia Mother    COPD Mother        smoked   Cancer Mother        bile duct   COPD Father    Diabetes Father    Heart disease Father    Cancer Father        throat   Dementia Father    Hypertension Sister    Hyperlipidemia Sister    Hypertension Brother    Hyperlipidemia Brother    Hypertension Brother    Hyperlipidemia Brother    Emphysema Maternal Grandmother        smoked   Asthma Son    Colon cancer Neg Hx    Colon polyps Neg Hx    Pancreatic cancer Neg Hx    Esophageal cancer Neg Hx    Inflammatory bowel disease Neg Hx    Liver disease Neg Hx    Rectal cancer Neg Hx    Stomach cancer Neg Hx    Scheduled Meds:  arformoterol  15 mcg Nebulization BID   budesonide (PULMICORT) nebulizer solution  0.5 mg Nebulization BID   Chlorhexidine Gluconate Cloth  6 each Topical Nightly   docusate  100 mg Per Tube BID   famotidine  20 mg Per Tube Daily   feeding supplement (PROSource TF20)  60 mL Per Tube Daily   feeding supplement (VITAL HIGH PROTEIN)  1,000 mL Per Tube Q24H   fentaNYL (SUBLIMAZE) injection  25 mcg Intravenous Once   folic acid  1 mg Per Tube Daily   heparin  5,000 Units Subcutaneous Q8H   methylPREDNISolone (SOLU-MEDROL) injection  40 mg Intravenous Q12H   mirtazapine  15 mg Per Tube QHS   mouth rinse  15 mL Mouth Rinse Q2H   oxybutynin  5 mg Per Tube Daily   polyethylene glycol  17 g Per Tube Daily   revefenacin  175 mcg Nebulization Daily   saccharomyces boulardii  250 mg Per Tube BID   sodium chloride flush  3 mL Intravenous Q12H  sodium chloride flush   3 mL Intravenous Q12H   Continuous Infusions:  sodium chloride     sodium chloride     azithromycin     cefTRIAXone (ROCEPHIN)  IV     fentaNYL infusion INTRAVENOUS 25 mcg/hr (06/14/22 1507)   norepinephrine (LEVOPHED) Adult infusion 4 mcg/min (06/14/22 1450)   propofol (DIPRIVAN) infusion 50.037 mcg/kg/min (06/14/22 1512)   sodium chloride     PRN Meds:.sodium chloride, acetaminophen **OR** acetaminophen, albuterol, bisacodyl, diphenoxylate-atropine, fentaNYL, midazolam, ondansetron **OR** ondansetron (ZOFRAN) IV, mouth rinse, oxyCODONE, prochlorperazine, sodium chloride flush, tiZANidine Allergies  Allergen Reactions   Lyrica [Pregabalin] Other (See Comments)    EXTREME SHAKING/TREMBLING   Darvon [Propoxyphene]     UNSPECIFIED REACTION    Gabapentin     Extreme shaking and trembling    Augmentin [Amoxicillin-Pot Clavulanate] Nausea And Vomiting    Has patient had a PCN reaction causing immediate rash, facial/tongue/throat swelling, SOB or lightheadedness with hypotension:no Has patient had a PCN reaction causing severe rash involving mucus membranes or skin necrosis:No Has patient had a PCN reaction that required hospitalization:No Has patient had a PCN reaction occurring within the last 10 years:Yes--ONLY N/V If all of the above answers are "NO", then may proceed with Cephalosporin use.    Erythromycin Itching and Rash    Tolerates Azithro    Vibramycin [Doxycycline Calcium] Itching and Rash    "SPLOTCHING "   CBC:    Component Value Date/Time   WBC 10.0 12-Jul-2022 0640   HGB 8.9 (L) 12-Jul-2022 0640   HGB 9.5 (L) 07/21/2021 1456   HCT 31.4 (L) 07-12-22 0640   HCT 28.2 (L) 07/21/2021 1456   PLT 323 07/12/22 0640   PLT 329 07/21/2021 1456   MCV 115.9 (H) 12-Jul-2022 0640   MCV 101 (H) 07/21/2021 1456   NEUTROABS 7.3 07-12-22 0640   NEUTROABS 6.1 06/07/2021 1505   LYMPHSABS 1.5 Jul 12, 2022 0640   LYMPHSABS 0.7 06/07/2021 1505   MONOABS 0.8 07-12-22 0640    EOSABS 0.1 07/12/2022 0640   EOSABS 0.0 06/07/2021 1505   BASOSABS 0.1 12-Jul-2022 0640   BASOSABS 0.1 06/07/2021 1505   Comprehensive Metabolic Panel:    Component Value Date/Time   NA 135 06/13/2022 0534   NA 139 07/21/2021 1456   K 4.1 06/13/2022 0534   CL 97 (L) 06/13/2022 0534   CO2 30 06/13/2022 0534   BUN 11 06/13/2022 0534   BUN 12 07/21/2021 1456   CREATININE 0.34 (L) 06/13/2022 0534   GLUCOSE 132 (H) 06/13/2022 0534   CALCIUM 8.4 (L) 06/13/2022 0534   AST 11 (L) 05/31/2022 1721   ALT 10 05/31/2022 1721   ALKPHOS 92 05/31/2022 1721   BILITOT 0.9 05/31/2022 1721   BILITOT <0.2 07/21/2021 1456   PROT 4.1 (L) 05/31/2022 1721   PROT 5.7 (L) 07/21/2021 1456   ALBUMIN 1.7 (L) 05/31/2022 1721   ALBUMIN 3.0 (L) 07/21/2021 1456    Physical Exam: Vital Signs: BP (!) 177/96   Pulse (!) 130   Temp (!) 100.8 F (38.2 C) (Axillary) Comment: notified nurse  Resp (!) 31   Ht 5\' 3"  (1.6 m)   Wt 42.6 kg   SpO2 97%   BMI 16.64 kg/m  SpO2: SpO2: 97 % O2 Device: O2 Device: Ventilator O2 Flow Rate: O2 Flow Rate (L/min): 4 L/min Intake/output summary:  Intake/Output Summary (Last 24 hours) at 06/14/2022 1521 Last data filed at 06/14/2022 1450 Gross per 24 hour  Intake 772.25 ml  Output 900 ml  Net -  127.75 ml   LBM: Last BM Date : 06/13/22 Baseline Weight: Weight: 45.3 kg Most recent weight: Weight: 42.6 kg  General: Intubated, sedated, ill-appearing, frail Eyes: No drainage noted HENT: Intubated with ET tube in place Cardiovascular: Tachycardia noted Respiratory: Intubated on ventilator support Abdomen: not distended Extremities: Muscle wasting present in all extremities Skin: no rashes or lesions on visible skin Neuro: Sedated          Palliative Performance Scale: 10%              Additional Data Reviewed: Recent Labs    06/06/2022 0640 06/13/22 0534  WBC 10.0  --   HGB 8.9*  --   PLT 323  --   NA 135 135  BUN 12 11  CREATININE 0.53 0.34*     Imaging: DG Abd 1 View CLINICAL DATA:  OG tube placement  EXAM: ABDOMEN - 1 VIEW  COMPARISON:  Abdominal radiograph 09/10/21  FINDINGS: Enteric tube courses below diaphragm with the side hole and tip projecting over the expected location of the stomach. Nerve stimulator battery pack projects over the left hemiabdomen. Lumbar spinal fusion hardware in place. There are few prominent loops of small and large bowel in an overall nonobstructive bowel gas pattern. No pneumatosis. No pneumoperitoneum.  See same day chest radiograph for thoracic findings.  IMPRESSION: Enteric tube courses below diaphragm with the side hole and tip projecting over the expected location of the stomach  Electronically Signed   By: Lorenza Cambridge M.D.   On: 06/14/2022 12:41 DG Chest Port 1 View CLINICAL DATA:  Intubation.  EXAM: PORTABLE CHEST 1 VIEW  COMPARISON:  Two days ago  FINDINGS: Endotracheal tube with tip just below the clavicular heads. The enteric tube tip and side-port reaches the stomach. Postoperative lungs with volume loss and opacity at the right upper lobe. Infiltrate at the left lung base which is new. Chronic lung disease with diaphragm flattening. Normal heart size. Spinal cord stimulator at midthoracic levels.  IMPRESSION: 1. Unremarkable hardware. 2. New infiltrate at the left lung base. 3. COPD and postoperative right apex.  Electronically Signed   By: Tiburcio Pea M.D.   On: 06/14/2022 05:08    I personally reviewed recent imaging.   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  Patient had presenting to [facility] on 4 8 for management of increasing shortness of breath and despite aggressive medical management for acute hypoxic/hypercarbic respiratory failure in the setting of COPD/bronchiectasis exacerbation and PNA, continue to deteriorate requiring BiPAP support and then intubation.  Currently patient in ICU receiving antibiotics support, pressors for hypotension management, and sedation while on ventilator support.  Palliative medicine team consulted to assist with complex medical decision making and psychosocial support.  # Complex medical decision making/goals of care  -Unable to discuss care with patient as currently intubated and sedated.  -Extensive discussion with patient's family including patient's husband/next of kin as described above in HPI.  Family noted that patient would not want tracheostomy or long-term ventilation.  Patient's CODE STATUS appropriately updated to DNR, continue appropriate medical care.  Allowing for time limited trial to  determine if patient will be able to improve.  Family did inquire about all possible pathways for medical care moving forward and prognosis.  With their permission, able to discuss the idea of a palliative extubation should patient not improve as hoped.  Offered emotional support and noted palliative medicine team would continue to follow along to do so.  -  Code Status: DNR   # Symptom management  -As per primary PCCM team   # Psycho-social/Spiritual Support:  - Support System: husband, sister  # Discharge Planning:  TBD  Thank you for allowing the palliative care team to participate in the care Lucious GrovesEvelyn C Mizell.  Alvester MorinLauren Annita Ratliff, DO Palliative Care Provider PMT # [phone]  If patient remains symptomatic despite maximum doses, please call PMT at [phone] between 0700 and 1900. Outside of these hours, please call attending, as PMT does not have night coverage.  This provider spent a  total of 87 minutes providing patient's care.  Includes review of EMR, discussing care with other staff members involved in patient's medical care, obtaining relevant history and information from patient and/or patient's family, and personal review of imaging and lab work. Greater than 50% of the time was spent counseling and coordinating care related to the above assessment and plan.    *Please note that this is a verbal dictation therefore any spelling or grammatical errors are due to the "[employer] One" system interpretation.

## 2022-06-14 NOTE — ED Provider Notes (Signed)
Emergency Depart Consult/Intubation Note  Called to the intensive care unit by hospitalist service to evaluate patient for intubation.  Patient previously admitted for respiratory failure.  She has been on BiPAP for most of the day and has significantly decreased level of consciousness with worsening CO2 retention.   Physical Exam    Physical Exam HENT:     Head: Atraumatic.  Cardiovascular:     Rate and Rhythm: Regular rhythm. Tachycardia present.  Pulmonary:     Effort: Tachypnea, accessory muscle usage and prolonged expiration present.     Breath sounds: Decreased air movement present.     Comments: Patient on BiPAP with increased work of breathing and significantly diminished breath sounds bilaterally Skin:    General: Skin is warm.  Neurological:     Mental Status: She is lethargic.     Procedures  Procedure Name: Intubation Date/Time: 06/14/2022 6:52 AM  Performed by: Gilda Crease, MDPre-anesthesia Checklist: Patient identified, Patient being monitored, Emergency Drugs available, Timeout performed and Suction available Oxygen Delivery Method: Non-rebreather mask Preoxygenation: Pre-oxygenation with 100% oxygen Induction Type: Rapid sequence Ventilation: Mask ventilation without difficulty Laryngoscope Size: Glidescope and 3 Grade View: Grade I Tube size: 7.5 mm Number of attempts: 1 Placement Confirmation: ETT inserted through vocal cords under direct vision, CO2 detector and Breath sounds checked- equal and bilateral Secured at: 23 cm Tube secured with: ETT holder       MDM    Patient encountered in intensive care unit.  Patient previously admitted for respiratory failure.  Evaluation reveals very poor mental status.  She is lethargic and not responding to stimuli very well.  I have concern that she is not protecting her airway.  Additionally, she has been on BiPAP for a number of hours and has worsening blood gases, has not been clearing her CO2.   Decision was made to intubate.  This was performed without difficulty.  Patient's husband is present in the ICU.  I did explain the procedure to him and give an extensive update on her condition.  Chest x-ray reviewed after intubation.  ET tube appears to be in appropriate position.  Care of patient turned back over to hospitalist service.        Gilda Crease, MD 06/14/22 [phone]

## 2022-06-14 NOTE — Progress Notes (Signed)
Patient was on Bipap at 14/6, R 12, 50% but her rate began to go up dramatically.  ABG was performed and PaCO2 was 118 with pH of 7.2.  Changed patient to AVAPS mode on Bipap.  Patient's rate is better and is getting better Vt at this time.  ABG again in one hour to see if new mode is bringing down PaCO2.

## 2022-06-14 NOTE — Progress Notes (Signed)
Patient being transported via [facility] to [facility] at this time to room 1228.  Report was called and given to Naomie Dean, RN.  Family aware of patient transfer.  All belongings packed and sent with family at this time.

## 2022-06-14 NOTE — H&P (Signed)
NAME:  Jasmine [name], MRN:  [id number], DOB:  [birth year], LOS: 2 ADMISSION DATE:  2022/07/11, CONSULTATION DATE:  06/14/22 REFERRING MD: Dr. Gwenlyn Perking, CHIEF COMPLAINT:  SOB   History of Present Illness:  67 y/o F, current smoker, who presented to [facility] via EMS Jul 11, 2022 with reports of SOB.   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 The patient was recently admitted from 3/27-4/2 with abdominal pain, nausea, vomiting & diarrhea in the setting of possible colitis.  Additionally, she was treated for LLL PNA. She was discharged on taper dose steroids and oral antibiotics.  Of note, she reportedly had C-Difficile ~ 6 months ago although no confirmatory test in [facility] or Care Everywhere. Previously tested positive in 11/2020.    She returned to the ER on Jul 11, 2022 with increasing shortness of breath.  On EMS arrival, her O2 was increased to 15L, she was given 125 mg IV solumedrol and nebulized bronchodilators.  She was admitted for acute hypoxic / hypercarbic respiratory failure in the setting of COPD/bronchiectasis exacerbation.  She was treated with nebulized pulmicort + brovana, IV steroids, doxycycline and pulmonary hygiene. She unfortunately had a worsening of her respiratory status despite BiPAP.  ABG 7.2 / 118 / 75 / 46.  The patient and family discussed plan of care and agreed for intubation.  Palliative care was consulted for evaluation & are pending further discussion.  She required intubation early am of 4/10 fur respiratory support.    PCCM consulted for transfer to ICU in [place].      Pertinent  Medical History  Anxiety  Allergies  Chronic Back Pain s/p Laminectomy, Lumbar Fusion & Spinal Cord Stimulator  Opioid Dependence  IDA HTN  ? Murmur  Pancreatitis  PNA  PONV  Cigarette Smoker  COPD  Right Lung Cancer s/p Lobectomy -   Significant Hospital Events: Including procedures, antibiotic start  and stop dates in addition to other pertinent events   07/11/22 Admit  4/10 Tx to [facility]   Interim History / Subjective:  Temp 100.8 On vent, sedate on propofol I/O 1.3L UOP, - in last 24 hours  Objective   Blood pressure 124/64, pulse (!) 122, temperature (!) 100.8 F (38.2 C), temperature source Axillary, resp. rate 18, weight 44.9 kg, SpO2 96 %.    Vent Mode: PRVC FiO2 (%):  [35 %-100 %] 40 % Set Rate:  [16 bmp] 16 bmp Vt Set:  [410 mL] 410 mL PEEP:  [5 cmH20] 5 cmH20 Plateau Pressure:  [22 cmH20-29 cmH20] 29 cmH20   Intake/Output Summary (Last 24 hours) at 06/14/2022 1234 Last data filed at 06/14/2022 1000 Gross per 24 hour  Intake 644.21 ml  Output 1200 ml  Net -555.79 ml   Filed Weights   06/13/22 0423 06/14/22 0625  Weight: 45.3 kg 44.9 kg    Examination: General: chronically ill appearing adult female lying in bed in NAD on vent HENT: MM pink/moist, ETT, pupils 3mm sluggishly reactive Lungs: non-labored at rest, synchronous on vent, lungs bilaterally diminished  Cardiovascular: s1s2 RRR, ST with PAC's Abdomen: non-distended, bsx4 active, gastric tube  Extremities: warm/dry, no edema, muscle wasting  Neuro: sedate on propofol   Resolved Hospital Problem list     Assessment & Plan:   Acute on Chronic Hypoxic & Hypercarbic Respiratory Failure  -PRVC with LTVV  -adjust Vt to 8cc -wean PEEP / fiO2 for sats >90% -PAD protocol with fentanyl &  propofol for RASS of 0 to -1  -CXR intermittently   Acute Exacerbation of COPD, Bronchiectasis  LLL PNA (POA) -brovana, pulmicort, yupelri  -PRN albuterol  -change abx to ceftriaxone, azithromycin -pulmonary hygiene as able   Squamous Cell Lung Cancer s/p RUL Lobectomy  Tobacco Abuse  -smoking cessation counseling when able  -will need follow up CT imaging in next month   Hypotension  Hx HTN  Suspect element volume depletion, sedation needs for mechanical ventilation -additional NS IV  bolus -NS at 39ml/hr after above  -hold home agents  -ICU monitoring  -levophed for MAP >65   Colitis vs Chronic Pancreatitis associated Diarrhea  -lomotil   Severe Protein Calorie Malnutrition (POA) -TF per protocol   Acute Metabolic Encephalopathy secondary to Hypercapnea  Chronic Pain Syndrome / Back Pain s/p Spinal Stimulator  Chronic Opioid Use  -minimize sedation as able  -PAD protocol as above  -bowel regimen with narcotics  -hold home percocet   Best Practice (right click and "Reselect all SmartList Selections" daily)  Diet/type: tubefeeds DVT prophylaxis: prophylactic heparin  GI prophylaxis: H2B Lines: N/A Foley:  Yes, and it is still needed Code Status:  DNR Last date of multidisciplinary goals of care discussion: 4/10, see iPAL note from Dr. Craige Cotta.  DNR, continue current acute care. Time limited trial on mechanical ventilation. No trach or long term vent.   Labs   CBC: Recent Labs  Lab 06/06/2022 0640  WBC 10.0  NEUTROABS 7.3  HGB 8.9*  HCT 31.4*  MCV 115.9*  PLT 323    Basic Metabolic Panel: Recent Labs  Lab 06/15/2022 0640 06/13/22 0534  NA 135 135  K 4.3 4.1  CL 94* 97*  CO2 35* 30  GLUCOSE 129* 132*  BUN 12 11  CREATININE 0.53 0.34*  CALCIUM 8.5* 8.4*   GFR: Estimated Creatinine Clearance: 47.7 mL/min (A) (by C-G formula based on SCr of 0.34 mg/dL (L)). Recent Labs  Lab 06/15/2022 0640 06/14/22 0922  PROCALCITON  --  4.79  WBC 10.0  --     Liver Function Tests: No results for input(s): "AST", "ALT", "ALKPHOS", "BILITOT", "PROT", "ALBUMIN" in the last 168 hours. No results for input(s): "LIPASE", "AMYLASE" in the last 168 hours. No results for input(s): "AMMONIA" in the last 168 hours.  ABG    Component Value Date/Time   PHART 7.39 06/14/2022 0603   PCO2ART 76 (HH) 06/14/2022 0603   PO2ART 241 (H) 06/14/2022 0603   HCO3 46.8 (H) 06/14/2022 0603   ACIDBASEDEF 5.1 (H) 10/11/2020 0330   O2SAT 99.9 06/14/2022 0603     Coagulation  Profile: No results for input(s): "INR", "PROTIME" in the last 168 hours.  Cardiac Enzymes: No results for input(s): "CKTOTAL", "CKMB", "CKMBINDEX", "TROPONINI" in the last 168 hours.  HbA1C: HB A1C (BAYER DCA - WAIVED)  Date/Time Value Ref Range Status  06/07/2021 03:02 PM 4.7 (L) 4.8 - 5.6 % Final    Comment:             Prediabetes: 5.7 - 6.4          Diabetes: >6.4          Glycemic control for adults with diabetes: <7.0    Hgb A1c MFr Bld  Date/Time Value Ref Range Status  10/11/2020 03:30 AM 5.1 4.8 - 5.6 % Final    Comment:    (NOTE) Pre diabetes:          5.7%-6.4%  Diabetes:              >  6.4%  Glycemic control for   <7.0% adults with diabetes     CBG: Recent Labs  Lab 06/14/22 0816 06/14/22 1153  GLUCAP 168* 151*    Review of Systems:   Unable to complete with patient due to AMS, mechanical ventilation. Information obtained from chart review, staff at bedside.   Past Medical History:  She,  has a past medical history of Allergy, Anemia, Anxiety, Arthritis, Asthma, Carpal tunnel syndrome, Chronic back pain, Cigarette smoker (08/25/2017), Complication of anesthesia, COPD (chronic obstructive pulmonary disease) (HCC), Easy bruising (07/10/2016), GERD (gastroesophageal reflux disease), Headache, Heart murmur (2005), History of blood transfusion (2018), History of kidney stones, Hypertension, Hypoglycemia, Hypoglycemia, Iron deficiency anemia (07/11/2016), Neuropathy, Normocytic anemia (07/29/2015), Pancreatitis, Pneumonia, PONV (postoperative nausea and vomiting), Postoperative anemia due to acute blood loss (11/15/2015), R lung cancer, Sciatica, Seizures (HCC), and Shortness of breath dyspnea.   Surgical History:   Past Surgical History:  Procedure Laterality Date   ABDOMINAL HYSTERECTOMY  2005   APPLICATION OF ROBOTIC ASSISTANCE FOR SPINAL PROCEDURE N/A 09/05/2016   Procedure: APPLICATION OF ROBOTIC ASSISTANCE FOR SPINAL PROCEDURE;  Surgeon: Lisbeth RenshawNundkumar, Neelesh, MD;   Location: [facility] OR;  Service: Neurosurgery;  Laterality: N/A;   BACK SURGERY     BIOPSY  06/14/2020   Procedure: BIOPSY;  Surgeon: Corbin Adeourk, Robert M, MD;  Location: [facility] END[facility] SUITE;  Service: Endoscopy;;   CERVICAL FUSION     CESAREAN SECTION  1982   COLONOSCOPY     unsure last   COLONOSCOPY WITH PROPOFOL N/A 06/14/2020   Procedure: COLONOSCOPY WITH PROPOFOL;  Surgeon: Corbin Adeourk, Robert M, MD;  Location: [facility];  Service: Endoscopy;  Laterality: N/A;  PM   cyst removal face     disk repair     neck and lumbar region   ESOPHAGOGASTRODUODENOSCOPY (EGD) WITH PROPOFOL N/A 06/14/2020   Procedure: ESOPHAGOGASTRODUODENOSCOPY (EGD) WITH PROPOFOL;  Surgeon: Corbin Adeourk, Robert M, MD;  Location: [facility];  Service: Endoscopy;  Laterality: N/A;   FOOT SURGERY Bilateral    to file bones down    HARDWARE REMOVAL N/A 09/05/2016   Procedure: HARDWARE REMOVAL AND REPLACEMENT OF LUMBAR FIVE SCREWS;  Surgeon: Lisbeth RenshawNundkumar, Neelesh, MD;  Location: [facility] OR;  Service: Neurosurgery;  Laterality: N/A;   IMPLANTATION / PLACEMENT EPIDURAL NEUROSTIMULATOR ELECTRODES     INTERCOSTAL NERVE BLOCK Right 07/29/2020   Procedure: INTERCOSTAL NERVE BLOCK;  Surgeon: Loreli SlotHendrickson, Steven C, MD;  [facility];  Service: Thoracic;  Laterality: Right;   LAMINECTOMY     2006   lobectomy Right 07/2020   LUMBAR FUSION     LYMPH NODE BIOPSY Right 07/29/2020   Procedure: LYMPH NODE BIOPSY;  Surgeon: Loreli SlotHendrickson, Steven C, MD;  [facility];  Service: Thoracic;  Laterality: Right;   POLYPECTOMY  06/14/2020   Procedure: POLYPECTOMY INTESTINAL;  Surgeon: Corbin Adeourk, Robert M, MD;  [facility];  Service: Endoscopy;;   SPINAL CORD STIMULATOR BATTERY EXCHANGE     SPINAL CORD STIMULATOR INSERTION N/A 07/05/2016   Procedure: REPLACEMENT OF LUMBAR SPINAL CORD STIMULATOR BATTERY;  Surgeon: Tressie StalkerJeffrey Jenkins, MD;  [facility];  Service: Neurosurgery;  Laterality: N/A;   TONSILLECTOMY       Social History:   reports that she has  been smoking cigarettes. She has a 8.75 pack-year smoking history. She has never used smokeless tobacco. She reports current alcohol use of about 2.0 standard drinks of alcohol per week. She reports that she does not use drugs.   Family History:  Her family history includes  Asthma in her son; COPD in her father and mother; Cancer in her father and mother; Dementia in her father; Diabetes in her father and mother; Emphysema in her maternal grandmother; Heart disease in her father and mother; Hyperlipidemia in her brother, brother, mother, and sister; Hypertension in her brother, brother, mother, and sister. There is no history of Colon cancer, Colon polyps, Pancreatic cancer, Esophageal cancer, Inflammatory bowel disease, Liver disease, Rectal cancer, or Stomach cancer.   Allergies Allergies  Allergen Reactions   Lyrica [Pregabalin] Other (See Comments)    EXTREME SHAKING/TREMBLING   Darvon [Propoxyphene]     UNSPECIFIED REACTION    Gabapentin     Extreme shaking and trembling    Augmentin [Amoxicillin-Pot Clavulanate] Nausea And Vomiting    Has patient had a PCN reaction causing immediate rash, facial/tongue/throat swelling, SOB or lightheadedness with hypotension:no Has patient had a PCN reaction causing severe rash involving mucus membranes or skin necrosis:No Has patient had a PCN reaction that required hospitalization:No Has patient had a PCN reaction occurring within the last 10 years:Yes--ONLY N/V If all of the above answers are "NO", then may proceed with Cephalosporin use.    Erythromycin Itching and Rash        Vibramycin [Doxycycline Calcium] Itching and Rash    "SPLOTCHING "     Home Medications  Prior to Admission medications   Medication Sig Start Date End Date Taking? Authorizing Provider  acetaminophen (TYLENOL) 500 MG tablet Take 1,000 mg by mouth every 6 (six) hours as needed for headache.   Yes [provider]  albuterol (VENTOLIN HFA) 108 (90 Base) MCG/ACT  inhaler 2puffs up to every 4 hours if can't catch your breath Patient taking differently: Inhale 1-2 puffs into the lungs every 4 (four) hours as needed for wheezing or shortness of breath. 01/11/22  Yes Nyoka Cowden, MD  amLODipine (NORVASC) 10 MG tablet Take 1 tablet (10 mg total) by mouth daily. 09/26/21  Yes Gottschalk, Ashly M, DO  budesonide (PULMICORT) 0.25 MG/2ML nebulizer solution Take 2 mLs (0.25 mg total) by nebulization in the morning and at bedtime. 01/11/22  Yes Nyoka Cowden, MD  diclofenac Sodium (VOLTAREN) 1 % GEL Apply 2 g topically 4 (four) times daily. 09/05/21  Yes [provider]  diphenoxylate-atropine (LOMOTIL) 2.5-0.025 MG tablet Take 1 tablet by mouth 3 (three) times daily as needed for diarrhea or loose stools. 06/06/22  Yes Osvaldo Shipper, MD  folic acid (FOLVITE) 1 MG tablet Take 1 tablet (1 mg total) by mouth daily. 06/07/22  Yes Osvaldo Shipper, MD  formoterol (PERFOROMIST) 20 MCG/2ML nebulizer solution Take 2 mLs (20 mcg total) by nebulization 2 (two) times daily. Use in nebulizer twice daily perfectly regularly 01/11/22  Yes Nyoka Cowden, MD  omeprazole (PRILOSEC) 40 MG capsule TAKE 1 CAPSULE BY MOUTH 1 TO 2 TIMES DAILY BEFORE A MEAL Patient taking differently: Take 40 mg by mouth See admin instructions. Take 1 capsule by mouth 1 to 2 times daily before a meal 04/17/22  Yes Mahon, Courtney L, NP  oxybutynin (DITROPAN-XL) 5 MG 24 hr tablet Take 5 mg by mouth daily. 12/12/21  Yes [provider]  oxyCODONE-acetaminophen (PERCOCET) 10-325 MG tablet Take 1 tablet by mouth 5 (five) times daily. 08/27/20  Yes [provider]  OXYCONTIN 15 MG 12 hr tablet Take 15 mg by mouth every 12 (twelve) hours. 08/28/20  Yes [provider]  OXYGEN Inhale 6-10 L into the lungs See admin instructions. continuous   Yes  [provider]  Pancrelipase, Lip-Prot-Amyl, 24000-76000 units CPEP Take 2 capsules (48,000 Units total) by mouth See admin  instructions. Take 2 caps daily with meals and 1 cap with snacks 02/14/22  Yes Johnson, Clanford L, MD  revefenacin (YUPELRI) 175 MCG/3ML nebulizer solution Inhale 3 mLs (175 mcg total) into the lungs daily. Patient taking differently: Inhale 175 mcg into the lungs daily. Do not mix with other nebulizer solutions 01/11/22  Yes Nyoka Cowden, MD  saccharomyces boulardii (FLORASTOR) 250 MG capsule Take 1 capsule (250 mg total) by mouth 2 (two) times daily for 14 days. 06/06/22 06/20/22 Yes Osvaldo Shipper, MD  tiZANidine (ZANAFLEX) 4 MG capsule Take 1 capsule (4 mg total) by mouth 3 (three) times daily as needed for muscle spasms. 09/13/21  Yes Vassie Loll, MD  traZODone (DESYREL) 150 MG tablet Take 2 tablets (300 mg total) by mouth at bedtime. 02/14/22  Yes Johnson, Clanford L, MD  metoCLOPramide (REGLAN) 5 MG tablet Take 1 tablet (5 mg total) by mouth every 6 (six) hours as needed for nausea. Patient not taking: Reported on 06/13/2022 02/14/22 05/31/22  Cleora Fleet, MD  mirtazapine (REMERON) 15 MG tablet Take 15 mg by mouth at bedtime. 09/26/21   [provider]  Nutritional Supplements (ENSURE HIGH PROTEIN) LIQD Drink 1 can 3 times daily. 08/30/20   Raliegh Ip, DO  predniSONE (DELTASONE) 20 MG tablet Tablets once daily for 3 days followed by 1 tablet once daily for 3 days and then stop Patient not taking: Reported on 06/13/2022 06/07/22   Osvaldo Shipper, MD  prochlorperazine (COMPAZINE) 25 MG suppository Place 1 suppository (25 mg total) rectally every 12 (twelve) hours as needed for nausea or vomiting. Patient not taking: Reported on 06/13/2022 08/07/21   Shon Hale, MD     Critical care time: 32 minutes    Canary Brim, MSN, APRN, NP-C, AGACNP-BC [facility] 06/14/2022, 12:34 PM   Please see [email or web address] for pager details.   From 7A-7P if no response, please call [phone] After hours, please call ELink [phone]

## 2022-06-15 ENCOUNTER — Inpatient Hospital Stay (HOSPITAL_COMMUNITY): Payer: Medicare Other

## 2022-06-15 ENCOUNTER — Ambulatory Visit: Payer: Medicare Other | Admitting: Internal Medicine

## 2022-06-15 DIAGNOSIS — J9621 Acute and chronic respiratory failure with hypoxia: Secondary | ICD-10-CM | POA: Diagnosis not present

## 2022-06-15 DIAGNOSIS — Z66 Do not resuscitate: Secondary | ICD-10-CM

## 2022-06-15 DIAGNOSIS — Z515 Encounter for palliative care: Secondary | ICD-10-CM | POA: Diagnosis not present

## 2022-06-15 DIAGNOSIS — J441 Chronic obstructive pulmonary disease with (acute) exacerbation: Secondary | ICD-10-CM | POA: Diagnosis not present

## 2022-06-15 DIAGNOSIS — K861 Other chronic pancreatitis: Secondary | ICD-10-CM | POA: Diagnosis not present

## 2022-06-15 LAB — BASIC METABOLIC PANEL
Anion gap: 8 (ref 5–15)
BUN: 16 mg/dL (ref 8–23)
CO2: 32 mmol/L (ref 22–32)
Calcium: 7.8 mg/dL — ABNORMAL LOW (ref 8.9–10.3)
Chloride: 99 mmol/L (ref 98–111)
Creatinine, Ser: 0.46 mg/dL (ref 0.44–1.00)
GFR, Estimated: >60 mL/min (ref >60)
Glucose, Bld: 120 mg/dL — ABNORMAL HIGH (ref 70–99)
Potassium: 2.8 mmol/L — ABNORMAL LOW (ref 3.5–5.1)
Sodium: 139 mmol/L (ref 135–145)

## 2022-06-15 LAB — CBC
HCT: 25.6 % — ABNORMAL LOW (ref 36.0–46.0)
Hemoglobin: 7.4 g/dL — ABNORMAL LOW (ref 12.0–15.0)
MCH: 32.7 pg (ref 26.0–34.0)
MCHC: 28.9 g/dL — ABNORMAL LOW (ref 30.0–36.0)
MCV: 113.3 fL — ABNORMAL HIGH (ref 80.0–100.0)
Platelets: 205 10*3/uL (ref 150–400)
RBC: 2.26 MIL/uL — ABNORMAL LOW (ref 3.87–5.11)
RDW: 14.3 % (ref 11.5–15.5)
WBC: 9.6 10*3/uL (ref 4.0–10.5)
nRBC: 0 % (ref 0.0–0.2)

## 2022-06-15 LAB — GLUCOSE, CAPILLARY
Glucose-Capillary: 125 mg/dL — ABNORMAL HIGH (ref 70–99)
Glucose-Capillary: 135 mg/dL — ABNORMAL HIGH (ref 70–99)
Glucose-Capillary: 151 mg/dL — ABNORMAL HIGH (ref 70–99)
Glucose-Capillary: 153 mg/dL — ABNORMAL HIGH (ref 70–99)
Glucose-Capillary: 157 mg/dL — ABNORMAL HIGH (ref 70–99)
Glucose-Capillary: 182 mg/dL — ABNORMAL HIGH (ref 70–99)
Glucose-Capillary: 198 mg/dL — ABNORMAL HIGH (ref 70–99)

## 2022-06-15 LAB — TRIGLYCERIDES: Triglycerides: 270 mg/dL — ABNORMAL HIGH (ref <150)

## 2022-06-15 LAB — HEMOGLOBIN A1C
Hgb A1c MFr Bld: 5.3 % (ref 4.8–5.6)
Mean Plasma Glucose: 105.41 mg/dL

## 2022-06-15 LAB — STREP PNEUMONIAE URINARY ANTIGEN: Strep Pneumo Urinary Antigen: NEGATIVE

## 2022-06-15 LAB — MAGNESIUM: Magnesium: 2.1 mg/dL (ref 1.7–2.4)

## 2022-06-15 LAB — PHOSPHORUS: Phosphorus: 3.4 mg/dL (ref 2.5–4.6)

## 2022-06-15 MED ORDER — INSULIN ASPART 100 UNIT/ML IJ SOLN
0.0000 [IU] | INTRAMUSCULAR | Status: DC
  Administered 2022-06-15 (×2): 2 [IU] via SUBCUTANEOUS
  Administered 2022-06-15 (×2): 1 [IU] via SUBCUTANEOUS
  Administered 2022-06-15 (×3): 2 [IU] via SUBCUTANEOUS
  Administered 2022-06-16: 1 [IU] via SUBCUTANEOUS
  Administered 2022-06-16: 2 [IU] via SUBCUTANEOUS

## 2022-06-15 MED ORDER — POTASSIUM CHLORIDE 10 MEQ/100ML IV SOLN
10.0000 meq | INTRAVENOUS | Status: AC
  Administered 2022-06-15 (×4): 10 meq via INTRAVENOUS
  Filled 2022-06-15 (×4): qty 100

## 2022-06-15 MED ORDER — POTASSIUM CHLORIDE 20 MEQ PO PACK
40.0000 meq | PACK | Freq: Once | ORAL | Status: AC
  Administered 2022-06-15: 40 meq
  Filled 2022-06-15: qty 2

## 2022-06-15 MED ORDER — OXYCODONE HCL 5 MG/5ML PO SOLN: 10.0000 mg | Freq: Four times a day (QID) | ORAL | Status: DC

## 2022-06-15 MED ORDER — VITAL 1.5 CAL PO LIQD
1000.0000 mL | ORAL | Status: DC
  Administered 2022-06-15: 1000 mL
  Filled 2022-06-15: qty 1000

## 2022-06-15 MED ORDER — OXYCODONE HCL 5 MG PO TABS
10.0000 mg | ORAL_TABLET | Freq: Four times a day (QID) | ORAL | Status: DC
  Administered 2022-06-15 – 2022-06-16 (×4): 10 mg via ORAL
  Filled 2022-06-15 (×4): qty 2

## 2022-06-15 NOTE — Progress Notes (Signed)
Daily Progress Note   Patient Name: Jasmine [name]       Date: 06/15/2022 DOB: 10-01-1955  Age: 67 y.o. MRN#: [id number] Attending Physician: Coralyn Helling, MD Primary Care Physician: Raliegh Ip, DO Admit Date: 07/03/2022 Length of Stay: 3 days  Reason for Consultation/Follow-up: Establishing goals of care and Psychosocial/spiritual support  Subjective:   CC: Patient remains intubated and sedated on ventilator support. Discussed care with patient's husband, Jasmine [name]), at bedside.   Subjective:  Extensive review of EMR prior to seeing patient.  Patient remains intubated on propofol for sedation.  Patient also requiring Levophed for hypotension support. Discussed care with PCCM providers.  Patient did not tolerate FiO2 decreased and continues to require medical management while sedated on ventilator.  Planning to continue support today with hope to decrease sedation potentially tomorrow to determine how patient will do. Discussed care with bedside RN for medical updates as well.   Presented to bedside to see patient.  Patient remains sedated on ventilator support.  Patient's husband, Jasmine [name], present at bedside.  Has been easily able to discuss plan that have been reviewed with him with PCCM provider.  Continuing with appropriate medical management at this time to determine if patient will be able to support respiratory efforts on her own.  This provider had already discussed one-way extubation with husband and other family members yesterday should that be an appropriate medical pathway moving forward determining on patient's medical status knowing she would not want to be on mechanical support indefinitely/receive tracheostomy.  Spent time providing emotional support via active listening.  All questions answered at that time.  Noted palliative medicine team will continue to follow along.  Objective:   Vital Signs:  BP (!) 112/57   Pulse 63   Temp 99 F (37.2 C) (Oral)   Resp 16   Ht  5\' 3"  (1.6 m)   Wt 46.6 kg   SpO2 98%   BMI 18.20 kg/m   Physical Exam: General: Intubated, sedated, ill-appearing, frail Eyes: No drainage noted HENT: Intubated with ET tube in place Cardiovascular: RRR Respiratory: Intubated on ventilator support Abdomen: not distended Extremities: Muscle wasting present in all extremities Skin: no rashes or lesions on visible skin Neuro: Sedated  Imaging:  I personally reviewed recent imaging.   Assessment & Plan:   Assessment: Patient is a 67 year old female with a past medical history chronic pain, COPD, RUL squamous cell carcinoma status post right upper lobe lobectomy 07/2020, chronic pancreatitis, and current tobacco use who was admitted to [facility] on 06/17/2022, then intubated and transferred to [facility] on 06/14/2022 for higher level of care management.  Patient had presenting to [facility] on 4 8 for management of increasing shortness of breath and despite aggressive medical management for acute hypoxic/hypercarbic respiratory failure in the setting of COPD/bronchiectasis exacerbation and PNA, continue to deteriorate requiring BiPAP support and then intubation.  Currently patient in ICU receiving antibiotics support, pressors for hypotension management, and sedation while on ventilator support.  Palliative medicine team consulted to assist with complex medical decision making and psychosocial support.   Recommendations/Plan: # Complex medical decision making/goals of care:   -Unable to discuss care with patient as she is currently intubated and sedated.                -Reviewed care plan with PCCM providers and patient's husband at bedside. Continuing appropraite medical therapies at this time to optimize patient's best chances of supporting her own respiratory efforts off of  the ventilator. Patient's husband had already stated that patient would not want trach/to be on long term ventilator support. Continuing with time limited  trial.                 -  Code Status: DNR  # Symptom management:    -As per primary PCCM team    # Psycho-social/Spiritual Support:  - Support System: husband, sister  # Discharge Planning: TBD  Discussed with: patient's husband, PCCM providers, bedside RN  Thank you for allowing the palliative care team to participate in the care Lucious GrovesEvelyn C Blanda.  Alvester MorinLauren Alvin Rubano, DO Palliative Care Provider PMT # [phone]  If patient remains symptomatic despite maximum doses, please call PMT at [phone] between 0700 and 1900. Outside of these hours, please call attending, as PMT does not have night coverage.  This provider spent a total of 38 minutes providing patient's care.  Includes review of EMR, discussing care with other staff members involved in patient's medical care, obtaining relevant history and information from patient and/or patient's family, and personal review of imaging and lab work. Greater than 50% of the time was spent counseling and coordinating care related to the above assessment and plan.    *Please note that this is a verbal dictation therefore any spelling or grammatical errors are due to the "[employer] One" system interpretation.

## 2022-06-15 NOTE — Progress Notes (Signed)
NAME:  Lucious Grovesvelyn C Calderone, MRN:  [id number], DOB:  [birth year], LOS: 2 ADMISSION DATE:  06/05/2022, CONSULTATION DATE:  06/14/22 REFERRING MD: Dr. Gwenlyn PerkingMadera, CHIEF COMPLAINT:  SOB   History of Present Illness:  67 y/o F, current smoker, who presented to [facility] via EMS 4/8 with reports of SOB.   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 The patient was recently admitted from 3/27-4/2 with abdominal pain, nausea, vomiting & diarrhea in the setting of possible colitis.  Additionally, she was treated for LLL PNA. She was discharged on taper dose steroids and oral antibiotics.  Of note, she reportedly had C-Difficile ~ 6 months ago although no confirmatory test in [facility] or Care Everywhere. Previously tested positive in 11/2020.    She returned to the ER on 4/8 with increasing shortness of breath.  On EMS arrival, her O2 was increased to 15L, she was given 125 mg IV solumedrol and nebulized bronchodilators.  She was admitted for acute hypoxic / hypercarbic respiratory failure in the setting of COPD/bronchiectasis exacerbation.  She was treated with nebulized pulmicort + brovana, IV steroids, doxycycline and pulmonary hygiene. She unfortunately had a worsening of her respiratory status despite BiPAP.  ABG 7.2 / 118 / 75 / 46.  The patient and family discussed plan of care and agreed for intubation.  Palliative care was consulted for evaluation & are pending further discussion.  She required intubation early am of 4/10 fur respiratory support.    PCCM consulted for transfer to ICU in [place].      Pertinent  Medical History  Anxiety  Allergies  Chronic Back Pain s/p Laminectomy, Lumbar Fusion & Spinal Cord Stimulator  Opioid Dependence  IDA HTN  ? Murmur  Pancreatitis  PNA  PONV  Cigarette Smoker  COPD  Right Lung Cancer s/p Lobectomy -   Significant Hospital Events: Including procedures, antibiotic start  and stop dates in addition to other pertinent events   4/8 Admit  4/10 Tx to [facility] febrile. Sedated on prop.  4/11 comfortable on vent no distress  Interim History / Subjective:  Appears comfortable on vent  No intermittently agitated.   Objective   Blood pressure 124/64, pulse (!) 122, temperature (!) 100.8 F (38.2 C), temperature source Axillary, resp. rate 18, weight 44.9 kg, SpO2 96 %.    Vent Mode: PRVC FiO2 (%):  [35 %-100 %] 40 % Set Rate:  [16 bmp] 16 bmp Vt Set:  [410 mL] 410 mL PEEP:  [5 cmH20] 5 cmH20 Plateau Pressure:  [22 cmH20-29 cmH20] 29 cmH20   Intake/Output Summary (Last 24 hours) at 06/14/2022 1234 Last data filed at 06/14/2022 1000 Gross per 24 hour  Intake 644.21 ml  Output 1200 ml  Net -555.79 ml   Filed Weights   06/13/22 0423 06/14/22 0625  Weight: 45.3 kg 44.9 kg    Examination: General this is a chronically ill appearing 67 year old female now on full vent support.  HENT NCAT orally intubated. No JVD Pulm decreased left base. Prolonged exp wheeze w/ basilar crackles PCXR residual LLL airspace disease .overall stable  Card rrr Ext no edema brisk CR + pulses Abd soft  Neuro sedated.   Resolved Hospital Problem list     Assessment & Plan:   Acute on Chronic Hypoxic & Hypercarbic Respiratory Failure 2/2 AECOPD, LLL PNA (PTA) and c/b underlying BTX I am worried that her underlying lung mechanics will  prevent her from coming off vent Plan Cont LTVV Wean PEEP/Fio2 Will anticipate SBT and PSV attempt 4/12. Family is aware that this could be challenging and we have discussed possibility that we may need to consider compassionate extubation on sedating gtts.  RASS goal 0 to -1 PRN CXR  Cont brovana, pulmiicort and yupelri PRN SABA Day 2 CTX and azith    Squamous Cell Lung Cancer s/p RUL Lobectomy  Tobacco Abuse  Plan smoking cessation counseling when able  will need follow up CT imaging in next month -->if survives    Hypotension (drug related)  Hx HTN  Sedation related hypotension Plan Cont low dose NE for MAP > 65 Cont NS at 75 cc/hr Hold antihypertensives  Cont tele   Colitis vs Chronic Pancreatitis associated Diarrhea  Plan lomotil   Severe Protein Calorie Malnutrition (POA) Plan TF per protocol (adv as tolerated)   Acute Metabolic Encephalopathy secondary to Hypercapnea  Chronic Pain Syndrome / Back Pain s/p Spinal Stimulator  Chronic Opioid Use  Requiring high dose prop and fent still has episodes where she will sit up in bed, reach for tube.  Plan Adding scheduled Oxycodone PAD protocol Bowel regimen    Best Practice (right click and "Reselect all SmartList Selections" daily)  Diet/type: tubefeeds DVT prophylaxis: prophylactic heparin  GI prophylaxis: H2B Lines: N/A Foley:  Yes, and it is still needed Code Status:  DNR Last date of multidisciplinary goals of care discussion: 4/10, see iPAL note from Dr. Craige Cotta.  DNR, continue current acute care. Time limited trial on mechanical ventilation. No trach or long term vent.     Critical care time: 34 min    Simonne Martinet ACNP-BC [facility] Pulmonary/Critical Care Pager # [phone] OR # [phone] if no answer

## 2022-06-15 NOTE — Progress Notes (Signed)
eLink Physician-Brief Progress Note Patient Name: QUYEN ANN DOB: Aug 27, 1955 MRN: [id number]   Date of Service  06/15/2022  HPI/Events of Note  K+ 2.8, Cr 0.46  eICU Interventions  K+ replaced per E-Link Electrolyte Replacement Protocol.        Thomasene Lot Liliane Mallis 06/15/2022, 6:05 AM

## 2022-06-15 NOTE — Progress Notes (Signed)
Initial Nutrition Assessment  DOCUMENTATION CODES:   Severe malnutrition in context of chronic illness  INTERVENTION:  - Change to Vital 1.5, increase slowly to goal as below: Tube feeding via NGT (xray verified in stomach): Vital 1.5 at 40 ml/h (960 ml per day) *Start at 21mL/hr and advance by 92mL Q12H Prosource TF20 60 ml daily Provides 1520 kcal, 85 gm protein, 733 ml free water daily  - Monitor magnesium, potassium, and phosphorus BID for at least 3 days, MD to replete as needed, as pt is at risk for refeeding syndrome given severe malnutrition with muscle and fat wasting and significant weight loss.  -Monitor weight trends.   NUTRITION DIAGNOSIS:   Severe Malnutrition related to chronic illness (severe COPD) as evidenced by severe fat depletion, severe muscle depletion, percent weight loss (14% in 4 months).  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   4/8 Admit 4/10 Intubated, Adult TF protocol started   Patient intubated and sedated at time of visit. Husband at bedside.   He reports patient's UBW to be around 99-102#. Notes she has been "thin" her entire life. Notes that she lost a lost of weight 3 months ago but has been more stable the past 2 months.  Per EMR, patient with a possible 15# or 14% weight loss in the past 4 months, which is significant for the time frame.   Husband reports patient had been eating well the past few months. Typically had a biscuit for breakfast, a snack for lunch, and a larger dinner. Endorses she  has not been eating well the past 3 weeks due to feeling poorly.  Adult TF protocol started yesterday. Patient was backed down to 10mL/hr yesterday after appearing to choke after starting TF. Advanced to 56mL/hr this AM of Vital HP.  Will change patient to Vital 1.5 to better meet estimated needs. Suspect patient at risk for refeeding syndrome given malnutrition and drop in electrolytes. Will plan to start Vital 1.5 at 64mL/hr due to higher calorie formula and increase to goal of 65mL/hr after 12 hours. Recommend very close monitoring of electrolytes. Plan discussed with RN.  Palliative care is following patient. Patient does not want trach/long term ventilation so patient on time limited trial.    Medications reviewed and include: Colace, Miralax, Remeron, Folic acid, Insulin Fentanyl Levophed Propofol @ 13.33mL/hr (provides 358 kcal over 24 hours)   Labs reviewed:   Latest Reference Range & Units 06/15/22 04:58  Potassium 3.5 - 5.1 mmol/L 2.8 (L)  (L): Data is abnormally low   Latest Reference Range & Units 06/14/22 15:15 06/14/22 16:58 06/15/22 04:58  Phosphorus 2.5 - 4.6 mg/dL 1.3 (L) <6.4 (LL) 3.4  (LL): Data is critically low (L): Data is abnormally low    Latest Reference Range & Units 06/02/22 14:42  Folate >5.9 ng/mL 4.2 (L)  (L): Data is abnormally low  NUTRITION - FOCUSED PHYSICAL EXAM:  Flowsheet Row Most Recent Value  Orbital Region Moderate depletion  Upper Arm Region Severe depletion  Thoracic and Lumbar Region Severe depletion  Buccal Region Unable to assess  Temple Region Moderate depletion  Clavicle Bone Region Moderate depletion  Clavicle and Acromion Bone Region Severe depletion  Scapular Bone Region Unable to assess  Dorsal Hand Moderate depletion  Patellar Region Severe depletion  Anterior Thigh Region Severe depletion  Posterior Calf Region Severe depletion  Edema (RD Assessment) None  Hair Reviewed  Eyes Unable to assess  Mouth Unable to assess   Skin Reviewed  Nails Reviewed       Diet Order:   Diet Order     None       EDUCATION NEEDS:  Education needs have been addressed  Skin:  Skin Assessment: Skin Integrity Issues: Skin Integrity Issues:: Stage II Stage II: Coccyx  Last BM:  4/9  Height:  Ht Readings from Last 1 Encounters:  06/14/22 5\' 3"  (1.6 m)   Weight:  Wt used for calculations:  06/14/22 42.6 kg    BMI:  Body mass index is 16.64 kg/m.  Estimated Nutritional Needs:  Kcal:  1350-1500 kcals Protein:  70-85 grams Fluid:  >/= 1.3L    Shelle Iron RD, LDN For contact information, refer to [facility].

## 2022-06-15 NOTE — Progress Notes (Signed)
Chaplain responded to nurse's request for support for family of pt. Pt's husband, [name] was at bedside. Pt's health has been declining, tomorrow () will be critical for pt's care and follow up will be needed for family support. I provided prayer, reflective listening and a calm presence. Pt and family have support of church family in [place], [place]. Chaplain [place], [place]   06/15/22 1415  Spiritual Encounters  Type of Visit Initial  Care provided to: [facility] partners present during encounter Nurse  Referral source Nurse (RN/NT/LPN)  Reason for visit Routine spiritual support  Spiritual Framework  Presenting Themes Significant life change;Impactful experiences and emotions  Community/Connection Family;Faith community  Patient Stress Factors Health changes  Interventions  Spiritual Care Interventions Made Established relationship of care and support;Compassionate presence;Reflective listening;Prayer  Intervention Outcomes  Outcomes Awareness around self/spiritual resourses;Awareness of support  Spiritual Care Plan  Spiritual Care Issues Still Outstanding Referring to oncoming chaplain for further support

## 2022-06-16 DIAGNOSIS — Z66 Do not resuscitate: Secondary | ICD-10-CM | POA: Diagnosis not present

## 2022-06-16 DIAGNOSIS — J441 Chronic obstructive pulmonary disease with (acute) exacerbation: Secondary | ICD-10-CM | POA: Diagnosis not present

## 2022-06-16 DIAGNOSIS — Z515 Encounter for palliative care: Secondary | ICD-10-CM | POA: Diagnosis not present

## 2022-06-16 DIAGNOSIS — Z7189 Other specified counseling: Secondary | ICD-10-CM | POA: Diagnosis not present

## 2022-06-16 DIAGNOSIS — J9621 Acute and chronic respiratory failure with hypoxia: Secondary | ICD-10-CM | POA: Diagnosis not present

## 2022-06-16 LAB — GLUCOSE, CAPILLARY
Glucose-Capillary: 181 mg/dL — ABNORMAL HIGH (ref 70–99)
Glucose-Capillary: 139 mg/dL — ABNORMAL HIGH (ref 70–99)

## 2022-06-16 LAB — BASIC METABOLIC PANEL
Anion gap: 8 (ref 5–15)
BUN: 17 mg/dL (ref 8–23)
CO2: 28 mmol/L (ref 22–32)
Calcium: 8.3 mg/dL — ABNORMAL LOW (ref 8.9–10.3)
Chloride: 103 mmol/L (ref 98–111)
Creatinine, Ser: 0.41 mg/dL — ABNORMAL LOW (ref 0.44–1.00)
GFR, Estimated: >60 mL/min (ref >60)
Glucose, Bld: 209 mg/dL — ABNORMAL HIGH (ref 70–99)
Potassium: 4.6 mmol/L (ref 3.5–5.1)
Sodium: 139 mmol/L (ref 135–145)

## 2022-06-16 LAB — CBC
HCT: 28.1 % — ABNORMAL LOW (ref 36.0–46.0)
Hemoglobin: 7.9 g/dL — ABNORMAL LOW (ref 12.0–15.0)
MCH: 32.8 pg (ref 26.0–34.0)
MCHC: 28.1 g/dL — ABNORMAL LOW (ref 30.0–36.0)
MCV: 116.6 fL — ABNORMAL HIGH (ref 80.0–100.0)
Platelets: 176 10*3/uL (ref 150–400)
RBC: 2.41 MIL/uL — ABNORMAL LOW (ref 3.87–5.11)
RDW: 14.2 % (ref 11.5–15.5)
WBC: 6.4 10*3/uL (ref 4.0–10.5)
nRBC: 0 % (ref 0.0–0.2)

## 2022-06-16 LAB — MAGNESIUM: Magnesium: 2.1 mg/dL (ref 1.7–2.4)

## 2022-06-16 LAB — PHOSPHORUS: Phosphorus: 3.1 mg/dL (ref 2.5–4.6)

## 2022-06-16 MED ORDER — SODIUM CHLORIDE 0.9 % IV SOLN: 0.4000 ug/kg/h | INTRAVENOUS | Status: DC

## 2022-06-16 MED ORDER — DOCUSATE SODIUM 50 MG/5ML PO LIQD
100.0000 mg | Freq: Two times a day (BID) | ORAL | Status: DC
Start: 2022-06-16 — End: 2022-06-16

## 2022-06-16 MED ORDER — FENTANYL 2500MCG IN NS 250ML (10MCG/ML) PREMIX INFUSION
0.0000 ug/h | INTRAVENOUS | Status: DC
  Administered 2022-06-16: 400 ug/h via INTRAVENOUS
  Filled 2022-06-16: qty 250

## 2022-06-16 MED ORDER — POLYVINYL ALCOHOL 1.4 % OP SOLN: 1.0000 [drp] | Freq: Four times a day (QID) | OPHTHALMIC | Status: DC | PRN

## 2022-06-16 MED ORDER — DEXMEDETOMIDINE HCL IN NACL 400 MCG/100ML IV SOLN
0.0000 ug/kg/h | INTRAVENOUS | Status: DC
  Administered 2022-06-16: 1.2 ug/kg/h via INTRAVENOUS
  Filled 2022-06-16: qty 100

## 2022-06-16 MED ORDER — DEXMEDETOMIDINE HCL IN NACL 200 MCG/50ML IV SOLN
0.0000 ug/kg/h | INTRAVENOUS | Status: DC
  Administered 2022-06-16: 0.4 ug/kg/h via INTRAVENOUS
  Filled 2022-06-16: qty 50

## 2022-06-16 MED ORDER — GLYCOPYRROLATE 0.2 MG/ML IJ SOLN
0.2000 mg | INTRAMUSCULAR | Status: DC | PRN
  Administered 2022-06-16: 0.2 mg via INTRAVENOUS
  Filled 2022-06-16: qty 1

## 2022-06-16 MED ORDER — GLYCOPYRROLATE 1 MG PO TABS: 1.0000 mg | ORAL_TABLET | ORAL | Status: DC | PRN

## 2022-06-16 MED ORDER — GLYCOPYRROLATE 0.2 MG/ML IJ SOLN: 0.2000 mg | INTRAMUSCULAR | Status: DC | PRN

## 2022-06-16 MED ORDER — POLYETHYLENE GLYCOL 3350 17 G PO PACK
17.0000 g | PACK | Freq: Every day | ORAL | Status: DC
Start: 2022-06-16 — End: 2022-06-16

## 2022-06-29 ENCOUNTER — Ambulatory Visit: Payer: Medicare Other | Admitting: Gastroenterology

## 2022-07-05 NOTE — Progress Notes (Signed)
Progress Patient now extubated.  Currently full comfort care measures.  She is requiring continuous fentanyl infusion as well as as needed Versed for work of breathing.  Currently she appears comfortable.  Her family is at bedside.  They know she is terminally ill and will not survive this hospitalization Plan Continue comfort care Will simplify medical orders Continue to titrate fentanyl for comfort Continue Precedex As needed Versed  Jasmine [name] ACNP-BC [facility] Pulmonary/Critical Care Pager # [phone] OR # [phone] if no answer

## 2022-07-05 NOTE — Progress Notes (Signed)
NAME:  Jasmine [name], MRN:  [id number], DOB:  09-07-1955, LOS: 4 ADMISSION DATE:  06/11/2022, CONSULTATION DATE:  06/14/22 REFERRING MD: Dr. Gwenlyn Perking, CHIEF COMPLAINT:  SOB   History of Present Illness:  67 y/o F, current smoker, who presented to [facility] via EMS 4/8 with reports of SOB.   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 The patient was recently admitted from 3/27-4/2 with abdominal pain, nausea, vomiting & diarrhea in the setting of possible colitis.  Additionally, she was treated for LLL PNA. She was discharged on taper dose steroids and oral antibiotics.  Of note, she reportedly had C-Difficile ~ 6 months ago although no confirmatory test in [facility] or Care Everywhere. Previously tested positive in 11/2020.    She returned to the ER on 4/8 with increasing shortness of breath.  On EMS arrival, her O2 was increased to 15L, she was given 125 mg IV solumedrol and nebulized bronchodilators.  She was admitted for acute hypoxic / hypercarbic respiratory failure in the setting of COPD/bronchiectasis exacerbation.  She was treated with nebulized pulmicort + brovana, IV steroids, doxycycline and pulmonary hygiene. She unfortunately had a worsening of her respiratory status despite BiPAP.  ABG 7.2 / 118 / 75 / 46.  The patient and family discussed plan of care and agreed for intubation.  Palliative care was consulted for evaluation & are pending further discussion.  She required intubation early am of 4/10 fur respiratory support.    PCCM consulted for transfer to ICU in [place].      Pertinent  Medical History  Anxiety  Allergies  Chronic Back Pain s/p Laminectomy, Lumbar Fusion & Spinal Cord Stimulator  Opioid Dependence  IDA HTN  ? Murmur  Pancreatitis  PNA  PONV  Cigarette Smoker  COPD  Right Lung Cancer s/p Lobectomy -   Significant Hospital Events: Including procedures, antibiotic start  and stop dates in addition to other pertinent events   4/8 Admit  4/10 Tx to [facility] febrile. Sedated on prop.  4/11 comfortable on vent no distress 4/12 transitioning to Precedex, working towards spontaneous breathing trial and anticipating one-way extubation with possible transition to comfort care if indicated family on board  Interim History / Subjective:  Comfortable.  Family all at bedside.  Anticipating weaning attempt  Objective   Blood pressure (Abnormal) 123/57, pulse 73, temperature 98.7 F (37.1 C), temperature source Axillary, resp. rate 16, height 5\' 3"  (1.6 m), weight 50.5 kg, SpO2 94 %.    Vent Mode: PRVC FiO2 (%):  [30 %-35 %] 35 % Set Rate:  [16 bmp] 16 bmp Vt Set:  [420 mL] 420 mL PEEP:  [5 cmH20] 5 cmH20 Plateau Pressure:  [20 cmH20-31 cmH20] 29 cmH20   Intake/Output Summary (Last 24 hours) at 06/23/2022 0758 Last data filed at 06/30/2022 0645 Gross per 24 hour  Intake 3900.95 ml  Output 950 ml  Net 2950.95 ml   Filed Weights   06/14/22 1430 06/15/22 0500 06/10/2022 0500  Weight: 42.6 kg 46.6 kg 50.5 kg    Examination: General: Chronically ill-appearing 67 year old female currently sedated on mechanical ventilator HEENT normocephalic atraumatic pupils pinpoint no JVD orally intubated Pulmonary: Clear to quotation, no wheezing today.  Decreased breath sounds bases. Cardiac regular rate and rhythm without murmur rub or gallop Abdomen: Soft nontender no organomegaly Extremities: Warm dry brisk cap refill Neuro: Sedated currently, will localize, and reach for tube.  Resolved  Hospital Problem list     Assessment & Plan:   Acute on Chronic Hypoxic & Hypercarbic Respiratory Failure 2/2 AECOPD, LLL PNA (PTA) and c/b underlying BTX Bronchospasm seems improved.  At this point I think we are ready to evaluate for weaning, unknown variable will be her underlying lung disease and if she can in fact come off mechanical ventilation Plan Discontinue  propofol and fentanyl infusion, place her on Precedex infusion As needed fentanyl for chronic pain and breakthrough pain Evaluate for spontaneous breathing trial once on Precedex and off propofol and fentanyl Continue scheduled systemic steroids Continue Brovana, Pulmicort and Yupelri Continue as needed Saba Day #3 ceftriaxone and azithromycin  Will assess for extubation later today, given the extensive discussions either way we are planning on one-way extubation  Family very concerned about comfort, and on board with transitioning to comfort care should there be evidence that she fails extubation or will fail extubation  DNR  Squamous Cell Lung Cancer s/p RUL Lobectomy  Tobacco Abuse  Plan smoking cessation counseling when able  Follow-up imaging would be a moot point  Hypotension (drug related)  Hx HTN  Sedation related hypotension Plan Continue low-dose norepinephrine for mean arterial pressure greater than 65 Continue saline at 75 cc an hour Hold antihypertensives  Cont tele   Colitis vs Chronic Pancreatitis associated Diarrhea  Plan As needed lomotil   Severe Protein Calorie Malnutrition (POA) Plan TF per protocol (adv as tolerated) will hold for now given possible extubation  Acute Metabolic Encephalopathy secondary to Hypercapnea  Chronic Pain Syndrome / Back Pain s/p Spinal Stimulator  Chronic Opioid Use  Requiring high dose prop and fent still has episodes where she will sit up in bed, reach for tube.  Plan Added scheduled oxycodone PAD protocol Bowel regimen   Anemia, macrocytic without evidence of acute bleeding. Plan Continue to trend CBC  Hyperglycemia Plan Sliding scale insulin Goal 140-180 Best Practice (right click and "Reselect all SmartList Selections" daily)  Diet/type: tubefeeds DVT prophylaxis: prophylactic heparin  GI prophylaxis: H2B Lines: N/A Foley:  Yes, and it is still needed Code Status:  DNR Last date of multidisciplinary goals of  care discussion: 4/10, see iPAL note from Dr. Craige Cotta.  DNR, continue current acute care. Time limited trial on mechanical ventilation. No trach or long term vent.     Critical care time: 33 min    Simonne Martinet ACNP-BC [facility] Pulmonary/Critical Care Pager # [phone] OR # [phone] if no answer

## 2022-07-05 NOTE — Procedures (Signed)
Extubation Procedure Note  Patient Details:   Name: Jasmine [name] DOB: 12-18-1955 MRN: [id number]   Airway Documentation:    Vent end date: 06/25/2022 Vent end time: 1045   Evaluation  O2 sats: stable throughout Complications: No apparent complications Patient did tolerate procedure well. Bilateral Breath Sounds: Diminished, Expiratory wheezes   Yes  Revonda Humphrey 06/09/2022, 10:55 AM

## 2022-07-05 NOTE — Progress Notes (Signed)
Daily Progress Note   Patient Name: Jasmine [name]       Date: 06/24/2022 DOB: 1955/12/02  Age: 67 y.o. MRN#: [id number] Attending Physician: Tomma Lightning, MD Primary Care Physician: Raliegh Ip, DO Admit Date: 06/20/2022 Length of Stay: 4 days  Reason for Consultation/Follow-up: Establishing goals of care and Psychosocial/spiritual support  Subjective:   Subjective:  Extensive review of EMR prior to seeing patient.  Discussed care with PCCM provider for medical updates.  Patient extubated this morning.  Patient receiving comfort focused care at this time.  Patient receiving IV fentanyl and Versed for symptom management.  Presented to bedside to see patient.  Patient appeared comfortable when seen.  Patient's family including her husband and sisters at bedside holding visual and appropriately grieving.  Discussed care with bedside RN.  Noted palliative medicine team will be available if needed.  Objective:   Vital Signs:  BP 119/63   Pulse 74   Temp 98.6 F (37 C) (Oral)   Resp 16   Ht 5\' 3"  (1.6 m)   Wt 50.5 kg   SpO2 98%   BMI 19.72 kg/m   Physical Exam: General: extubated, ill-appearing, frail, obtunded Cardiovascular: RRR Respiratory: no increased respiratory effort Abdomen: not distended Extremities: Muscle wasting present in all extremities Skin: no rashes or lesions on visible skin  Imaging:  I personally reviewed recent imaging.   Assessment & Plan:   Assessment: Patient is a 67 year old female with a past medical history chronic pain, COPD, RUL squamous cell carcinoma status post right upper lobe lobectomy 07/2020, chronic pancreatitis, and current tobacco use who was admitted to [facility] on 06/25/2022, then intubated and transferred to [facility] on 06/14/2022 for higher level of care management.  Patient had presenting to [facility] on 4 8 for management of increasing shortness of breath and despite aggressive medical management for acute  hypoxic/hypercarbic respiratory failure in the setting of COPD/bronchiectasis exacerbation and PNA, continue to deteriorate requiring BiPAP support and then intubation.  Currently patient in ICU receiving antibiotics support, pressors for hypotension management, and sedation while on ventilator support.  Palliative medicine team consulted to assist with complex medical decision making and psychosocial support.   Recommendations/Plan: # Complex medical decision making/goals of care:   -Patient palliatively extubated today after discussion with patient's family.  Patient receiving comfort focused care at this time.  Anticipate in-hospital death.                -  Code Status: DNR  # Symptom management:    -Receiving IV fentanyl and Versed as per primary team   # Psycho-social/Spiritual Support:  - Support System: husband, sister  # Discharge Planning: In-hospital death anticipated  Discussed with:  PCCM providers, bedside RN  Thank you for allowing the palliative care team to participate in the care Lucious Groves.  Alvester Morin, DO Palliative Care Provider PMT # [phone]  If patient remains symptomatic despite maximum doses, please call PMT at [phone] between 0700 and 1900. Outside of these hours, please call attending, as PMT does not have night coverage.  This provider spent a total of 35 minutes providing patient's care.  Includes review of EMR, discussing care with other staff members involved in patient's medical care, obtaining relevant history and information from patient and/or patient's family, and personal review of imaging and lab work. Greater than 50% of the time was spent counseling and coordinating care related to the above assessment and plan.    *Please  note that this is a verbal dictation therefore any spelling or grammatical errors are due to the "[employer] One" system interpretation.

## 2022-07-05 NOTE — Progress Notes (Signed)
   06/22/2022 1415  Spiritual Encounters  Type of Visit Initial  Care provided to: Family  Referral source Chaplain team  Reason for visit Patient death  OnCall Visit No   Chaplain responded to a request for support for the family. The patient died and family is grieving appropriately. I listened as the family member reflected and shared that the patient, Earica is no longer suffering and while she misses her she knows it is better for [facility] Family is ok and would like a bit more time with their loved one.   Trish [employer]  [phone]

## 2022-07-05 NOTE — Progress Notes (Signed)
Attempted SBT this am on high dose precedex. Still has sig WOB and anxiety. I do not think that she is going to get much better. I discussed at length w/ the husband and the patient's sisters. They again tell me she would NOT want prolonged ventilation or even NIPPV at night. Given the degree of her advanced lung disease we feel that she will not stay off the vent successfully. The family all agree that comfort care would be the appropriate plan for her going forward.  Plan Extubate on precedex and fent Comfort care   Simonne Martinet ACNP-BC [name] Pulmonary/Critical Care Pager # [phone] OR # [phone] if no answer

## 2022-07-05 NOTE — TOC Progression Note (Signed)
Transition of Care [facility]) - Progression Note    Patient Details  Name: Jasmine [name] MRN: [id number] Date of Birth: November 01, 1955  Transition of Care [facility]) CM/SW Contact  Lavenia Atlas, RN Phone Number: 06/15/2022, 11:29 AM  Clinical Narrative:   Per chart review patient transferred from [facility], prior to this admit patient recently discharged on 4/2 with COPD exacerbation. No changes, continues to smoke on 6L home oxygen provided by [employer] Per Morrie Sheldon with [employer] patient active w/ [facility] services. PMH: lung CA, s/p upper lobectomy. Per chart current plan is comfort care. This RNCM will await TOC consult for hospice services.    TOC will continue to follow.       Expected Discharge Plan: Home w Home Health Services Barriers to Discharge: Continued Medical Work up  Expected Discharge Plan and Services       Living arrangements for the past 2 months: Single Family Home                                       Social Determinants of Health (SDOH) Interventions SDOH Screenings   Food Insecurity: No Food Insecurity (06/14/2022)  Housing: Low Risk  (06/14/2022)  Transportation Needs: No Transportation Needs (06/14/2022)  Utilities: Not At Risk (06/14/2022)  Alcohol Screen: Low Risk  (04/10/2022)  Depression (PHQ2-9): Low Risk  (04/10/2022)  Financial Resource Strain: Low Risk  (04/10/2022)  Physical Activity: Inactive (04/10/2022)  Social Connections: Moderately Integrated (04/10/2022)  Stress: No Stress Concern Present (04/10/2022)  Tobacco Use: High Risk (06/14/2022)    Readmission Risk Interventions    06/13/2022   10:55 AM 09/28/2021   11:35 AM 08/05/2021   11:27 AM  Readmission Risk Prevention Plan  Transportation Screening Complete Complete Complete  Medication Review Oceanographer) Complete Complete Complete  PCP or Specialist appointment within 3-5 days of discharge Not Complete    HRI or Home Care Consult Complete Patient refused Patient refused  SW Recovery Care/Counseling  Consult Complete Complete Complete  Palliative Care Screening Not Applicable Not Applicable Not Applicable  Skilled Nursing Facility Not Applicable Not Applicable Not Applicable

## 2022-07-05 NOTE — Death Summary Note (Signed)
DEATH SUMMARY   Patient Details  Name: JAMIE-LEE GALDAMEZ MRN: [id number] DOB: Jul 30, 1955  Admission/Discharge Information   Admit Date:  June 15, 2022  Date of Death: Date of Death: June 19, 2022  Time of Death: Time of Death: Jul 08, 1331  Length of Stay: 4  Referring Physician: Raliegh Ip, DO   Reason(s) for Hospitalization  Patient presented to [facility] with worsening shortness of breath  Diagnoses  Preliminary cause of death: Hypoxemic/hypercapnic respiratory failure chronic obstructive pulmonary disease  Secondary Diagnoses (including complications and co-morbidities):  Principal Problem:   COPD with acute exacerbation Active Problems:   Hepatic steatosis   Cigarette smoker   Chronic pancreatitis   Essential hypertension   Acute on chronic respiratory failure with hypoxia and hypercapnia   Need for emotional support   Counseling and coordination of care   Goals of care, counseling/discussion   Acute on chronic respiratory failure with hypoxia   Palliative care encounter   DNR (do not resuscitate)   End of life care   Brief Hospital Course (including significant findings, care, treatment, and services provided and events leading to death)  FALECIA VANNATTER is a 67 y.o. 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  Recently treated for pneumonia. Was on antibiotics, breathing treatments. Was very bronchospastic but appeared to stabilize. With discussions with family knowing fully well that patient will not want to be on the ventilator for any extended period of time and will not want a tracheostomy, decision was made to make a DO NOT RESUSCITATE with plans to continue to wean and extubate when able.  Patient did progress to being able to be extubated but with very poor reserves, decompensated and was made comfortable  Patient succumbed to her illness on  2022/06/19 at 1333 hours  Hypercapnic/hypoxemic respiratory failure Advanced chronic obstructive pulmonary disease  Pertinent Labs and Studies  Significant Diagnostic Studies DG Chest 1 View  Result Date: 06/15/2022 CLINICAL DATA:  COPD, hypertension and shortness of breath. EXAM: CHEST  1 VIEW COMPARISON:  06/14/2022 FINDINGS: ET tube tip is above the carina. Enteric tube and side port are in the stomach. Stable cardiomediastinal contours. Aortic atherosclerotic calcification. Unchanged chronic coarsened interstitial markings bilaterally. Mild hazy opacity in the left base is similar to previous exam. Small bilateral pleural effusions are suspected, right greater than left. No airspace consolidation or interstitial edema IMPRESSION: 1. Stable support apparatus. 2. Similar appearance of mild hazy opacity in the left base which may reflect pneumonia or asymmetric edema. 3. Suspect small bilateral pleural effusions, right greater than left. Electronically Signed   By: Signa Kell M.D.   On: 06/15/2022 07:07   DG Abd 1 View  Result Date: 06/14/2022 CLINICAL DATA:  OG tube placement EXAM: ABDOMEN - 1 VIEW COMPARISON:  Abdominal radiograph 09/10/21 FINDINGS: Enteric tube courses below diaphragm with the side hole and tip projecting over the expected location of the stomach. Nerve stimulator battery pack projects over the left hemiabdomen. Lumbar spinal fusion hardware in place. There are few prominent loops of small and large bowel in an overall nonobstructive bowel gas pattern. No pneumatosis. No pneumoperitoneum. See same day chest radiograph for thoracic findings. IMPRESSION: Enteric tube courses below diaphragm with the side hole and tip projecting over the expected location of the stomach Electronically Signed   By: Lorenza Cambridge M.D.   On: 06/14/2022 12:41   DG Chest [facility] 1 View  Result  Date: 06/14/2022 CLINICAL DATA:  Intubation. EXAM: PORTABLE CHEST 1 VIEW COMPARISON:  Two days ago FINDINGS:  Endotracheal tube with tip just below the clavicular heads. The enteric tube tip and side-port reaches the stomach. Postoperative lungs with volume loss and opacity at the right upper lobe. Infiltrate at the left lung base which is new. Chronic lung disease with diaphragm flattening. Normal heart size. Spinal cord stimulator at midthoracic levels. IMPRESSION: 1. Unremarkable hardware. 2. New infiltrate at the left lung base. 3. COPD and postoperative right apex. Electronically Signed   By: Tiburcio Pea M.D.   On: 06/14/2022 05:08   DG Chest Port 1 View  Result Date: 06/17/2022 CLINICAL DATA:  67 year old female with shortness of breath. COPD. History of right upper lobectomy with recurrent 2 cm spiculated right lung mass on CTA last year. EXAM: PORTABLE CHEST 1 VIEW COMPARISON:  Portable chest 06/01/2022 and earlier. FINDINGS: Portable AP upright view at 0648 hours. Stable lung volumes. Chronic right upper lung architectural distortion appears stable from last month, somewhat less nodular since last year and there might have been interval radiation treatment, uncertain. Stable cardiac size and mediastinal contours. Chronic thoracic spinal stimulator device. No superimposed pneumothorax, pulmonary edema, acute pleural effusion or other acute lung opacity. Stable visualized osseous structures. Negative visible bowel gas. IMPRESSION: Chronic lung disease with prior lobectomy and query radiation therapy to the right upper lung since last year. Stable architectural distortion, ventilation since last month. No acute cardiopulmonary abnormality identified. Electronically Signed   By: Odessa Fleming M.D.   On: 06/13/2022 06:56   DG Chest Port 1 View  Result Date: 06/01/2022 CLINICAL DATA:  Cough.  Congestion. EXAM: PORTABLE CHEST 1 VIEW COMPARISON:  Chest radiograph 02/04/2022 FINDINGS: Spinal nerve stimulator leads project over the midthoracic spine. Partially visualized lumbar spinal fusion hardware in place. Small  bilateral pleural effusions, new from prior. No pneumothorax. Unchanged cardiac and mediastinal contours. No new focal airspace opacity. There is persistent linear reticular opacities at the right lung apex, favored to represent scarring, unchanged from prior exam. Visualized upper abdomen is notable for gas distended loops of small and large bowel. No radiographically apparent displaced rib fractures. IMPRESSION: 1. Small bilateral pleural effusions, new from prior. 2. No new focal airspace opacity. Consolidative opacity seen on recent prior CT abdomen pelvis is not visualized on exam. 3. Visualized upper abdomen is notable for gas distended loops of small and large bowel. Electronically Signed   By: Lorenza Cambridge M.D.   On: 06/01/2022 12:23   CT ABDOMEN PELVIS W CONTRAST  Result Date: 05/31/2022 CLINICAL DATA:  Acute nonlocalized abdominal pain. Nausea, vomiting, and diarrhea for 6 weeks. Smoker. EXAM: CT ABDOMEN AND PELVIS WITH CONTRAST TECHNIQUE: Multidetector CT imaging of the abdomen and pelvis was performed using the standard protocol following bolus administration of intravenous contrast. RADIATION DOSE REDUCTION: This exam was performed according to the departmental dose-optimization program which includes automated exposure control, adjustment of the mA and/or kV according to patient size and/or use of iterative reconstruction technique. CONTRAST:  OMNIPAQUE IOHEXOL 300 MG/ML  SOLN COMPARISON:  02/04/2022 FINDINGS: Lower chest: Dense consolidation and volume loss in the left lower lung. This is likely consolidative pneumonia but could indicate postobstructive change due to central obstructing lesion. Patchy infiltrates in the remainder of the lung bases. Hepatobiliary: Mild diffuse fatty infiltration of the liver. No focal lesions. Prominent intra and extrahepatic bile duct dilatation is similar to prior study. No obstructing mass or stone is identified. Gallbladder is normal.  Pancreas: Diffuse  pancreatic ductal dilatation with pancreatic parenchymal atrophy. Calcification in the head of the pancreas. Similar appearance to prior study. Changes are likely due to chronic pancreatitis. An occult obstructing lesion is not entirely excluded and consideration of follow-up elective MRI/MRCP should be considered if not previously performed. Spleen: Normal in size without focal abnormality. Adrenals/Urinary Tract: No adrenal gland nodules. Nephrograms are symmetrical. No hydronephrosis or hydroureter. Multiple bilateral subcentimeter renal cysts. Some of the cysts have increased density, likely hemorrhagic. No change in appearance or size since prior study. No imaging follow-up is indicated. Bladder is normal. Stomach/Bowel: Stomach, small bowel, and colon are not abnormally distended. Decompression of the colon limits evaluation but there appears to be diffuse wall thickening involving the sigmoid and descending colon which could indicate infectious or inflammatory colitis. No extraluminal stranding. No loculated collections. Appendix is not identified. Vascular/Lymphatic: Extensive aortoiliac calcification. No aneurysm. No significant lymphadenopathy. Reproductive: Status post hysterectomy. No adnexal masses. Other: No free air or free fluid in the abdomen. Abdominal wall musculature appears intact. Generator pack in the left gluteal region with leads extending superiorly. Musculoskeletal: Degenerative changes in the spine. Postoperative fixation of the lower lumbosacral spine. IMPRESSION: 1. No evidence of bowel obstruction. Suggestion of wall thickening in the descending and rectosigmoid colon possibly indicating colitis. Consider infectious or inflammatory etiologies. 2. Stable appearance of intra and extrahepatic bile duct dilatation and pancreatic ductal dilatation. Pancreatic parenchymal atrophy and calcification in the head of the pancreas. Changes are likely due to chronic pancreatitis although an occult  obstructing lesion is not entirely excluded. If not done previously, consider elective follow-up with MRCP. 3. Prominent aortic atherosclerosis. 4. Dense consolidation in the left lower lung with patchy infiltrates in both lower lungs. Changes probably represent pneumonia but a centrally obstructing lesion would not be excluded. Suggest completion CT of the chest for further evaluation. Electronically Signed   By: Burman Nieves M.D.   On: 05/31/2022 19:54    Microbiology Recent Results (from the past 240 hour(s))  MRSA Next Gen by PCR, Nasal     Status: None   Collection Time: 06/11/2022  3:40 PM   Specimen: Nasal Mucosa; Nasal Swab  Result Value Ref Range Status   MRSA by PCR Next Gen NOT DETECTED NOT DETECTED Final    Comment: (NOTE) The GeneXpert MRSA Assay (FDA approved for NASAL specimens only), is one component of a comprehensive MRSA colonization surveillance program. It is not intended to diagnose MRSA infection nor to guide or monitor treatment for MRSA infections. Test performance is not FDA approved in patients less than 91 years old. Performed at [facility], [address] [place], [place] [address]     Lab Basic Metabolic Panel: Recent Labs  Lab 06/13/22 0534 06/14/22 1515 06/14/22 1658 06/15/22 0458 06/22/2022 0258  NA 135  --   --  139 139  K 4.1  --   --  2.8* 4.6  CL 97*  --   --  99 103  CO2 30  --   --  32 28  GLUCOSE 132*  --   --  120* 209*  BUN 11  --   --  16 17  CREATININE 0.34*  --   --  0.46 0.41*  CALCIUM 8.4*  --   --  7.8* 8.3*  MG  --  1.7 1.5* 2.1 2.1  PHOS  --  1.3* <1.0* 3.4 3.1   Liver Function Tests: No results for input(s): "AST", "ALT", "ALKPHOS", "BILITOT", "PROT", "ALBUMIN"  in the last 168 hours. No results for input(s): "LIPASE", "AMYLASE" in the last 168 hours. No results for input(s): "AMMONIA" in the last 168 hours. CBC: Recent Labs  Lab 06/15/22 0458 06/25/2022 0258  WBC 9.6 6.4  HGB 7.4* 7.9*  HCT 25.6* 28.1*  MCV 113.3*  116.6*  PLT 205 176   Cardiac Enzymes: No results for input(s): "CKTOTAL", "CKMB", "CKMBINDEX", "TROPONINI" in the last 168 hours. Sepsis Labs: Recent Labs  Lab 06/14/22 0922 06/15/22 0458 06/27/2022 0258  PROCALCITON 4.79  --   --   WBC  --  9.6 6.4    Procedures/Operations     Seynabou Fults A Barba Solt 06/19/2022, 12:33 PM

## 2022-07-05 DEATH — deceased

## 2022-09-25 IMAGING — DX DG CHEST 2V
2 series · 2 of 2 positions shown · non-contrast
Comparison: 07/26/2017

CLINICAL DATA: Productive cough, shortness of breath, history
asthma, mixed-type COPD

EXAM:
CHEST - 2 VIEW

[chest pa]
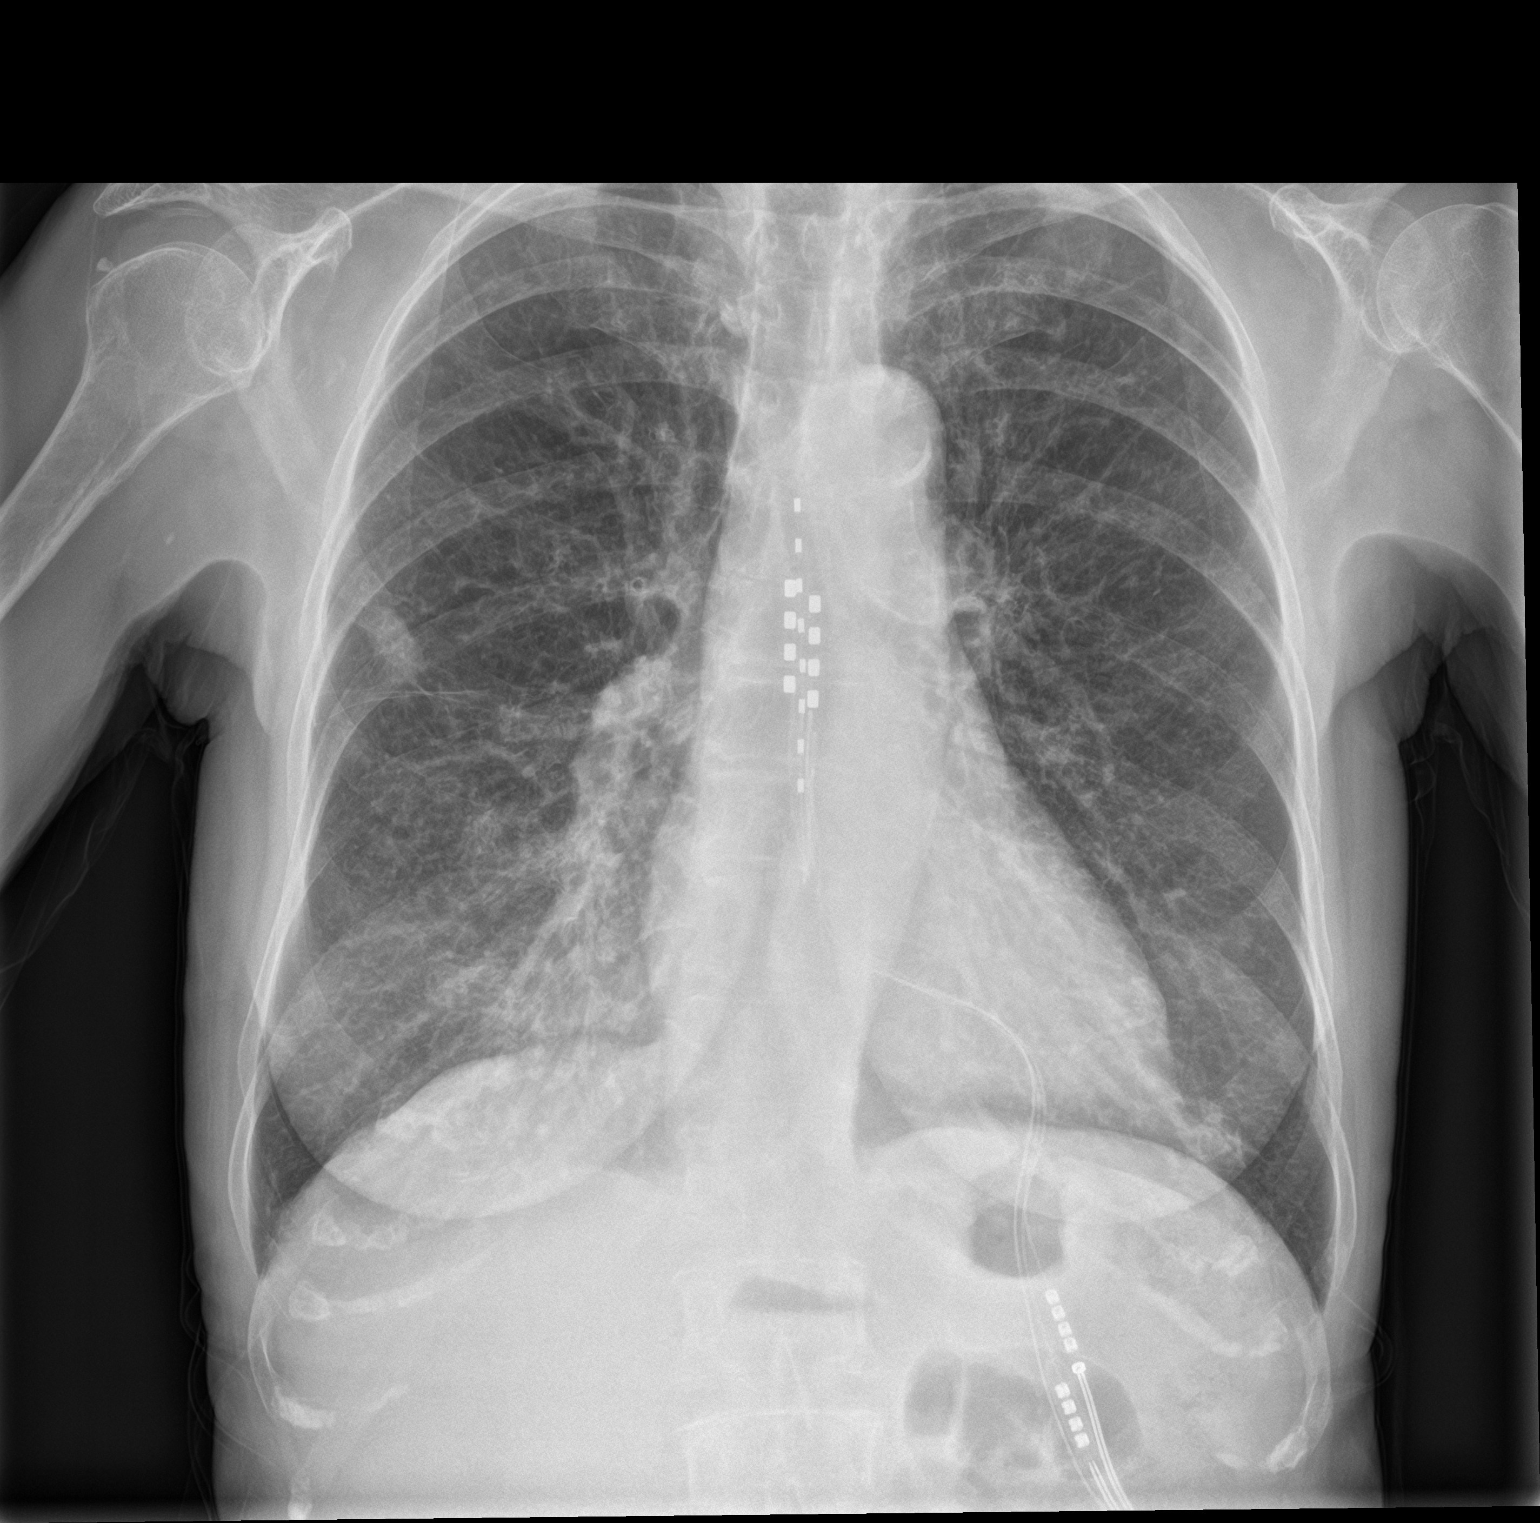

[chest lat]
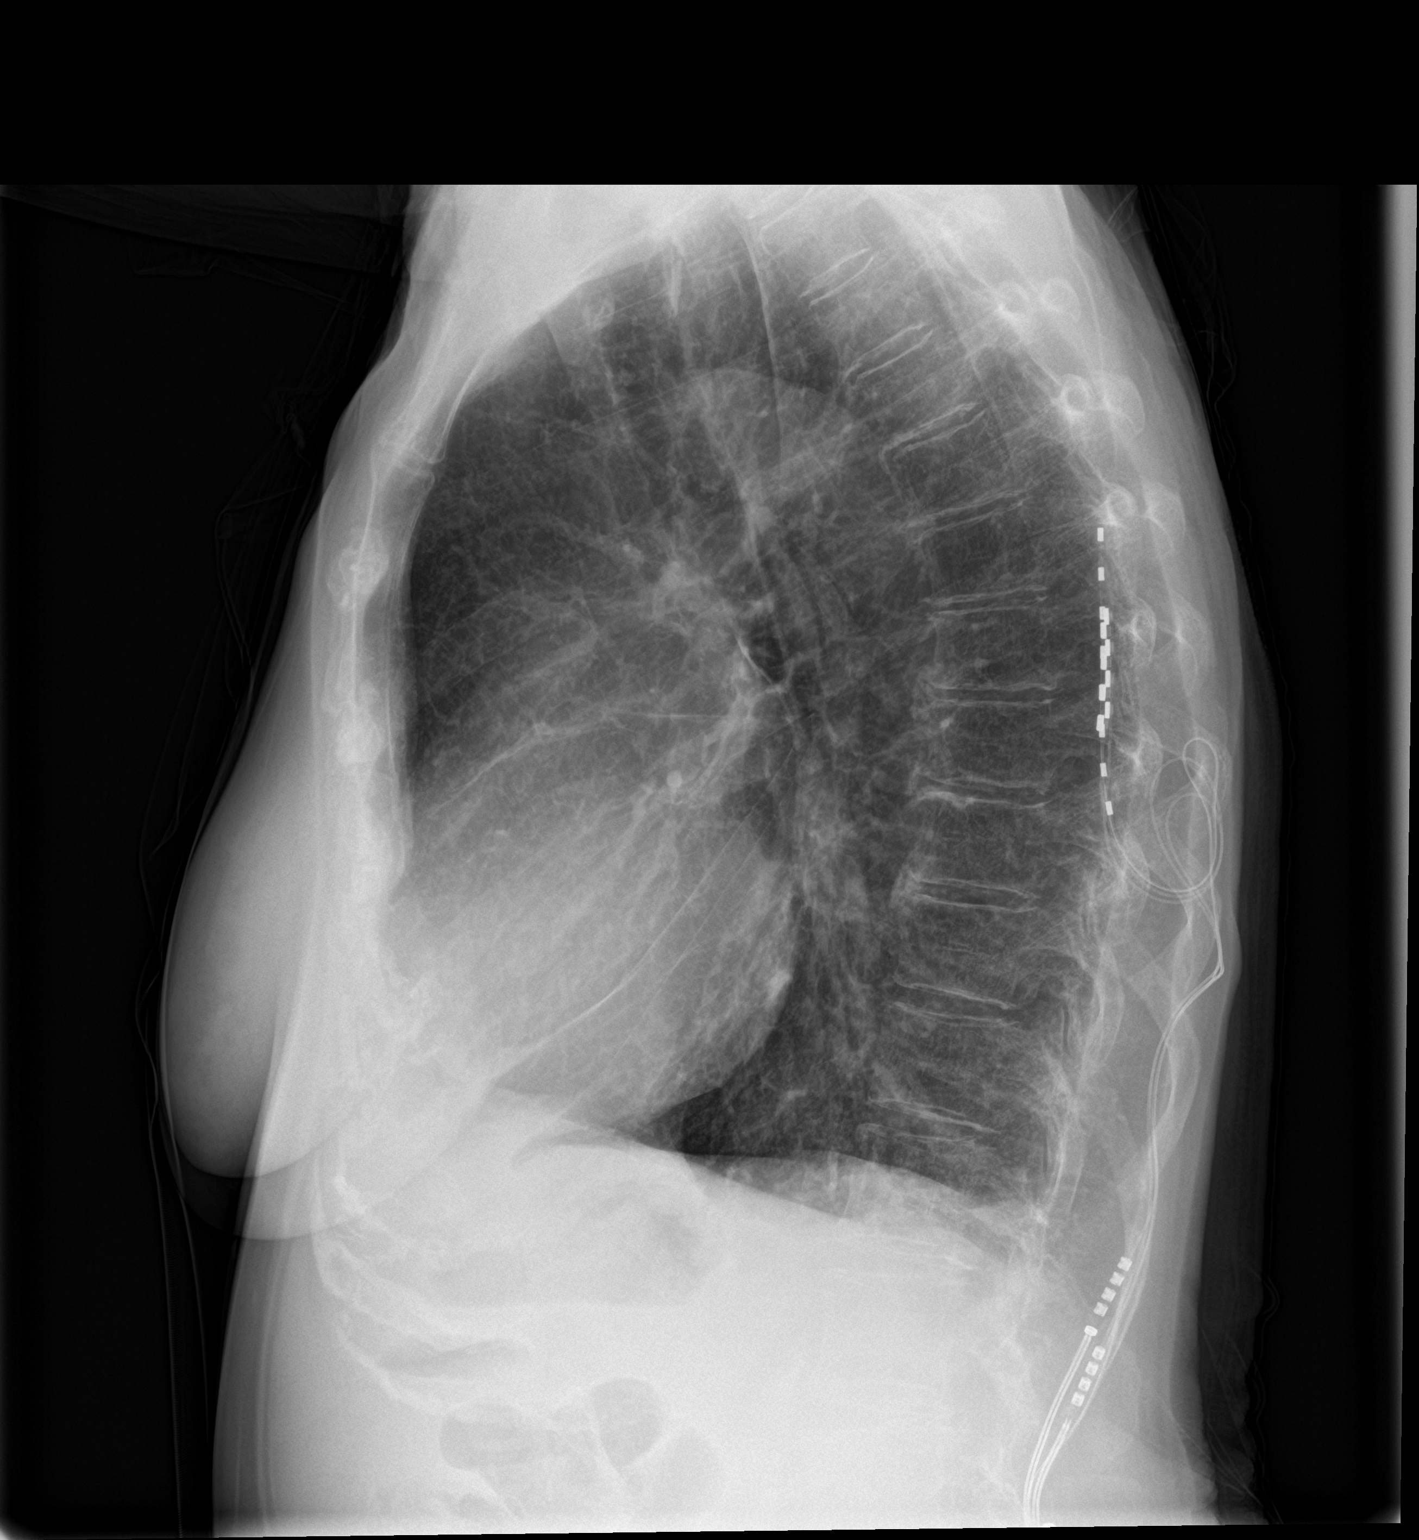

[2 of 2 positions shown; findings below may reference images not displayed]

FINDINGS: Intraspinal stimulator present.

Normal heart size, mediastinal contours, and pulmonary vascularity.

Atherosclerotic calcification aorta.

Emphysematous and bronchitic changes consistent with COPD.

New opacity in medial RIGHT lower lobe consistent with atelectasis
or pneumonia.

Nodular density in mid RIGHT lung 14 x 10 mm question pulmonary
nodule.

No additional infiltrate, pleural effusion or pneumothorax.

Bones demineralized.
IMPRESSION: COPD changes with RIGHT lower lobe atelectasis versus infiltrate.

New 14 x 10 mm nodular density in the RIGHT mid lung; CT chest with
contrast recommended for further evaluation.

These results will be called to the ordering clinician or
representative by the Radiologist Assistant, and communication
documented in the PACS or [REDACTED].
